# Patient Record
Sex: Male | Born: 1960
Health system: Southern US, Community
[De-identification: ages and names within clinical notes are randomized; demographics above are authoritative.]

## PROBLEM LIST (undated history)

## (undated) DIAGNOSIS — G4733 Obstructive sleep apnea (adult) (pediatric): Secondary | ICD-10-CM

## (undated) DIAGNOSIS — F32A Depression, unspecified: Secondary | ICD-10-CM

## (undated) DIAGNOSIS — K219 Gastro-esophageal reflux disease without esophagitis: Secondary | ICD-10-CM

## (undated) DIAGNOSIS — Z95810 Presence of automatic (implantable) cardiac defibrillator: Secondary | ICD-10-CM

## (undated) DIAGNOSIS — Z8719 Personal history of other diseases of the digestive system: Secondary | ICD-10-CM

## (undated) DIAGNOSIS — Z8639 Personal history of other endocrine, nutritional and metabolic disease: Secondary | ICD-10-CM

## (undated) DIAGNOSIS — Z87442 Personal history of urinary calculi: Secondary | ICD-10-CM

## (undated) DIAGNOSIS — J189 Pneumonia, unspecified organism: Secondary | ICD-10-CM

## (undated) DIAGNOSIS — I209 Angina pectoris, unspecified: Secondary | ICD-10-CM

## (undated) DIAGNOSIS — I4891 Unspecified atrial fibrillation: Secondary | ICD-10-CM

## (undated) DIAGNOSIS — E785 Hyperlipidemia, unspecified: Secondary | ICD-10-CM

## (undated) DIAGNOSIS — F329 Major depressive disorder, single episode, unspecified: Secondary | ICD-10-CM

## (undated) DIAGNOSIS — I10 Essential (primary) hypertension: Secondary | ICD-10-CM

## (undated) DIAGNOSIS — Z9989 Dependence on other enabling machines and devices: Secondary | ICD-10-CM

## (undated) DIAGNOSIS — I509 Heart failure, unspecified: Secondary | ICD-10-CM

## (undated) DIAGNOSIS — K649 Unspecified hemorrhoids: Secondary | ICD-10-CM

## (undated) DIAGNOSIS — E119 Type 2 diabetes mellitus without complications: Secondary | ICD-10-CM

## (undated) DIAGNOSIS — M549 Dorsalgia, unspecified: Secondary | ICD-10-CM

## (undated) DIAGNOSIS — F419 Anxiety disorder, unspecified: Secondary | ICD-10-CM

## (undated) DIAGNOSIS — I2510 Atherosclerotic heart disease of native coronary artery without angina pectoris: Secondary | ICD-10-CM

## (undated) DIAGNOSIS — I959 Hypotension, unspecified: Secondary | ICD-10-CM

## (undated) DIAGNOSIS — G8929 Other chronic pain: Secondary | ICD-10-CM

## (undated) HISTORY — DX: Atherosclerotic heart disease of native coronary artery without angina pectoris: I25.10

## (undated) HISTORY — PX: TONSILLECTOMY: SUR1361

## (undated) HISTORY — PX: CORONARY ANGIOPLASTY WITH STENT PLACEMENT: SHX49

## (undated) HISTORY — DX: Hyperlipidemia, unspecified: E78.5

## (undated) HISTORY — DX: Unspecified atrial fibrillation: I48.91

## (undated) HISTORY — DX: Personal history of other endocrine, nutritional and metabolic disease: Z86.39

## (undated) HISTORY — DX: Essential (primary) hypertension: I10

## (undated) HISTORY — DX: Type 2 diabetes mellitus without complications: E11.9

---

## 1989-03-03 HISTORY — PX: REFRACTIVE SURGERY: SHX103

## 2003-09-15 ENCOUNTER — Ambulatory Visit (HOSPITAL_COMMUNITY): Admission: RE | Admit: 2003-09-15 | Discharge: 2003-09-15 | Payer: Self-pay | Admitting: *Deleted

## 2003-10-01 ENCOUNTER — Ambulatory Visit (HOSPITAL_COMMUNITY): Admission: RE | Admit: 2003-10-01 | Discharge: 2003-10-02 | Payer: Self-pay | Admitting: Cardiology

## 2004-01-07 ENCOUNTER — Ambulatory Visit (HOSPITAL_BASED_OUTPATIENT_CLINIC_OR_DEPARTMENT_OTHER): Admission: RE | Admit: 2004-01-07 | Discharge: 2004-01-07 | Payer: Self-pay | Admitting: Cardiology

## 2004-08-15 ENCOUNTER — Ambulatory Visit: Payer: Self-pay | Admitting: Cardiology

## 2004-08-25 ENCOUNTER — Ambulatory Visit: Payer: Self-pay | Admitting: Pulmonary Disease

## 2004-08-25 ENCOUNTER — Ambulatory Visit: Payer: Self-pay

## 2004-08-25 ENCOUNTER — Ambulatory Visit: Payer: Self-pay | Admitting: Cardiology

## 2005-02-23 ENCOUNTER — Ambulatory Visit: Payer: Self-pay | Admitting: Cardiology

## 2005-03-09 ENCOUNTER — Ambulatory Visit: Payer: Self-pay

## 2005-03-09 ENCOUNTER — Ambulatory Visit: Payer: Self-pay | Admitting: Cardiology

## 2006-10-08 ENCOUNTER — Emergency Department (HOSPITAL_COMMUNITY): Admission: EM | Admit: 2006-10-08 | Discharge: 2006-10-08 | Payer: Self-pay | Admitting: Emergency Medicine

## 2007-02-26 ENCOUNTER — Ambulatory Visit: Admission: RE | Admit: 2007-02-26 | Discharge: 2007-02-26 | Payer: Self-pay | Admitting: Interventional Cardiology

## 2007-02-26 ENCOUNTER — Ambulatory Visit: Payer: Self-pay | Admitting: Surgery

## 2007-02-26 ENCOUNTER — Encounter: Payer: Self-pay | Admitting: Internal Medicine

## 2007-07-26 ENCOUNTER — Encounter: Payer: Self-pay | Admitting: Internal Medicine

## 2007-07-31 ENCOUNTER — Encounter: Payer: Self-pay | Admitting: Internal Medicine

## 2009-02-16 ENCOUNTER — Encounter: Admission: RE | Admit: 2009-02-16 | Discharge: 2009-02-16 | Payer: Self-pay | Admitting: Emergency Medicine

## 2009-04-15 ENCOUNTER — Encounter: Payer: Self-pay | Admitting: Internal Medicine

## 2009-04-19 ENCOUNTER — Encounter: Payer: Self-pay | Admitting: Internal Medicine

## 2009-09-28 ENCOUNTER — Encounter: Payer: Self-pay | Admitting: Internal Medicine

## 2010-02-14 ENCOUNTER — Encounter: Payer: Self-pay | Admitting: Internal Medicine

## 2010-02-16 ENCOUNTER — Encounter: Payer: Self-pay | Admitting: Internal Medicine

## 2010-02-18 ENCOUNTER — Telehealth (INDEPENDENT_AMBULATORY_CARE_PROVIDER_SITE_OTHER): Payer: Self-pay | Admitting: *Deleted

## 2010-03-03 ENCOUNTER — Ambulatory Visit: Payer: Self-pay | Admitting: Diagnostic Radiology

## 2010-03-03 ENCOUNTER — Emergency Department (HOSPITAL_BASED_OUTPATIENT_CLINIC_OR_DEPARTMENT_OTHER): Admission: EM | Admit: 2010-03-03 | Discharge: 2010-03-03 | Payer: Self-pay | Admitting: Emergency Medicine

## 2010-03-10 ENCOUNTER — Ambulatory Visit: Payer: Self-pay | Admitting: Family Medicine

## 2010-03-10 DIAGNOSIS — J45909 Unspecified asthma, uncomplicated: Secondary | ICD-10-CM | POA: Insufficient documentation

## 2010-03-10 DIAGNOSIS — R32 Unspecified urinary incontinence: Secondary | ICD-10-CM | POA: Insufficient documentation

## 2010-03-10 DIAGNOSIS — I10 Essential (primary) hypertension: Secondary | ICD-10-CM | POA: Insufficient documentation

## 2010-03-10 DIAGNOSIS — IMO0001 Reserved for inherently not codable concepts without codable children: Secondary | ICD-10-CM | POA: Insufficient documentation

## 2010-03-10 DIAGNOSIS — E1165 Type 2 diabetes mellitus with hyperglycemia: Secondary | ICD-10-CM

## 2010-03-10 DIAGNOSIS — E785 Hyperlipidemia, unspecified: Secondary | ICD-10-CM | POA: Insufficient documentation

## 2010-03-10 LAB — CONVERTED CEMR LAB: LDL Cholesterol: 122.3 mg/dL

## 2010-03-11 ENCOUNTER — Encounter: Payer: Self-pay | Admitting: Family Medicine

## 2010-03-14 LAB — CONVERTED CEMR LAB
ALT: 29 units/L (ref 0–53)
Alkaline Phosphatase: 82 units/L (ref 39–117)
BUN: 19 mg/dL (ref 6–23)
Basophils Relative: 0.7 % (ref 0.0–3.0)
Bilirubin, Direct: 0.1 mg/dL (ref 0.0–0.3)
Calcium: 10.1 mg/dL (ref 8.4–10.5)
Chloride: 103 meq/L (ref 96–112)
Cholesterol: 187 mg/dL (ref 0–200)
Creatinine, Ser: 1.3 mg/dL (ref 0.4–1.5)
Creatinine,U: 129.5 mg/dL
Eosinophils Relative: 4 % (ref 0.0–5.0)
GFR calc non Af Amer: 64.17 mL/min (ref >60)
HDL: 31.9 mg/dL — ABNORMAL LOW (ref >39.00)
Hgb A1c MFr Bld: 10.2 % — ABNORMAL HIGH (ref 4.6–6.5)
Lymphocytes Relative: 43.9 % (ref 12.0–46.0)
MCV: 100.9 fL — ABNORMAL HIGH (ref 78.0–100.0)
Microalb Creat Ratio: 3.9 mg/g (ref 0.0–30.0)
Monocytes Absolute: 0.9 10*3/uL (ref 0.1–1.0)
Neutrophils Relative %: 36.3 % — ABNORMAL LOW (ref 43.0–77.0)
PSA: 0.24 ng/mL (ref 0.10–4.00)
Platelets: 272 10*3/uL (ref 150.0–400.0)
RBC: 4.99 M/uL (ref 4.22–5.81)
Total Bilirubin: 0.5 mg/dL (ref 0.3–1.2)
Total CHOL/HDL Ratio: 6
Total CK: 161 units/L (ref 7–232)
Triglycerides: 330 mg/dL — ABNORMAL HIGH (ref 0.0–149.0)
VLDL: 66 mg/dL — ABNORMAL HIGH (ref 0.0–40.0)
WBC: 6.2 10*3/uL (ref 4.5–10.5)

## 2010-04-01 ENCOUNTER — Telehealth (INDEPENDENT_AMBULATORY_CARE_PROVIDER_SITE_OTHER): Payer: Self-pay | Admitting: *Deleted

## 2010-04-01 ENCOUNTER — Encounter: Payer: Self-pay | Admitting: Family Medicine

## 2010-04-07 ENCOUNTER — Encounter: Payer: Self-pay | Admitting: Internal Medicine

## 2010-04-27 ENCOUNTER — Telehealth: Payer: Self-pay | Admitting: Family Medicine

## 2010-05-09 ENCOUNTER — Ambulatory Visit: Payer: Self-pay | Admitting: Family Medicine

## 2010-06-08 ENCOUNTER — Ambulatory Visit: Payer: Self-pay | Admitting: Family Medicine

## 2010-06-09 ENCOUNTER — Ambulatory Visit: Payer: Self-pay | Admitting: Internal Medicine

## 2010-07-05 ENCOUNTER — Ambulatory Visit: Admit: 2010-07-05 | Payer: Self-pay | Admitting: Family Medicine

## 2010-07-24 ENCOUNTER — Encounter: Payer: Self-pay | Admitting: Interventional Cardiology

## 2010-08-02 NOTE — Letter (Signed)
Summary: [facility] Visit Summary [facility] Physicians Office Visit Summary View   Imported By: Sallee Provencal 05/19/2010 16:12:56  _____________________________________________________________________  External Attachment:    Type:   Image     Comment:   External Document

## 2010-08-02 NOTE — Letter (Signed)
Summary: [facility] Visit Note   [facility] Physicians Office Visit Note   Imported By: Sallee Provencal 05/19/2010 16:10:11  _____________________________________________________________________  External Attachment:    Type:   Image     Comment:   External Document

## 2010-08-02 NOTE — Progress Notes (Signed)
Summary: Meds refilled   Phone Note Refill Request   Refills Requested: Medication #1:  ATACAND 32 MG TABS 1 by mouth qd  Medication #2:  PLAVIX 75 MG TABS 1 by mouth daily [facility] [address] [phone].Marland KitchenMarland KitchenPT HAS NOT HAD THESE MEDS FILLED HERE, SEEN AS NEW EST CARE 03/10/10 AND LABS WERE ALSO DONE AT THAT TIME, PLEASE ADV IF IT IS OK TO FAX IN RX   Method Requested: Electronic Initial call taken by: Aron Baba CMA Deborra Medina),  April 27, 2010 2:52 PM  Follow-up for Phone Call        pt was supposed to be seeing Cardiology---was that appointment never made? records were requested to be sent there.  refill x1 but we need to know what is going on with cardio Follow-up by: Garnet Koyanagi DO,  April 27, 2010 3:24 PM  Additional Follow-up for Phone Call Additional follow up Details #1::        No Referrals completed or cancelled in [facility], but there is a Cardiology preload done on 10/18 with another PCP's name on it. Additional Follow-up by: Aron Baba CMA Deborra Medina),  April 27, 2010 4:40 PM    Additional Follow-up for Phone Call Additional follow up Details #2::    Dr Caryl Comes is a cardiologist with [facility]- not pcp they requested his records from [facility] so it looks like pt was going to see them  Prescriptions: PLAVIX 75 MG TABS (CLOPIDOGREL BISULFATE) 1 by mouth daily  #30 x 0   Entered by:   Aron Baba CMA ([place])   Authorized by:   Garnet Koyanagi DO   Signed by:   Aron Baba CMA ([place]) on 04/28/2010   Method used:   Faxed to ...       [facility] [id number]* (retail)       [place], [place]  [address]       Ph: [phone]       Fax: [phone]   RxID:   [id number] ATACAND 32 MG TABS (CANDESARTAN CILEXETIL) 1 by mouth qd  #30 x 0   Entered by:   Aron Baba CMA ([place])   Authorized by:   Garnet Koyanagi DO   Signed by:   Aron Baba CMA ([place]) on 04/28/2010   Method used:   Faxed to ...       [facility]  [id number] (retail)       [place], [place]  [address]       Ph: [phone]       Fax: [phone]   RxID:   [id number]

## 2010-08-02 NOTE — Letter (Signed)
Summary: Glucose Log from Patient  Glucose Log from Patient   Imported By: Edmonia James 04/08/2010 10:22:15  _____________________________________________________________________  External Attachment:    Type:   Image     Comment:   External Document

## 2010-08-02 NOTE — Assessment & Plan Note (Signed)
Summary: CPX/FASTING//KN   Vital Signs:  Patient profile:   50 year old male Height:      71 inches Weight:      227.0 pounds Temp:     99.2 degrees F oral Pulse rate:   84 / minute Pulse rhythm:   regular BP sitting:   120 / 84  (right arm) Cuff size:   regular  Vitals Entered By: Aron Baba CMA Deborra Medina) (May 09, 2010 1:11 PM) CC: CPX--stated that he had been having leg pain, thinks it could be from Lipitor-- stopped meds 3 days ago   History of Present Illness: Pt here for cpe --- labs done earlier.   Lipitor was causing leg pain--- he stopped Lipitor 3 days ago  and pain improved but is not gone.  Pt also needs victoza.     Type 1 diabetes mellitus follow-up      This is a 50 year old man who presents with Type 2 diabetes mellitus follow-up.  The patient denies polyuria, polydipsia, blurred vision, self managed hypoglycemia, hypoglycemia requiring help, weight loss, weight gain, and numbness of extremities.  The patient denies the following symptoms: neuropathic pain, chest pain, vomiting, orthostatic symptoms, poor wound healing, intermittent claudication, vision loss, and foot ulcer.  Since the last visit the patient reports good dietary compliance, compliance with medications, not exercising regularly, and not monitoring blood glucose.  Since the last visit, the patient reports having had no eye care and no foot care.    Preventive Screening-Counseling & Management  Alcohol-Tobacco     Alcohol drinks/day: <1     Alcohol type: all     Smoking Status: never  Caffeine-Diet-Exercise     Caffeine use/day: 0     Does Patient Exercise: no  Hep-HIV-STD-Contraception     Dental Visit-last 6 months no     Dental Care Counseling: to seek dental care; no dental care within six months      Sexual History:  single.        Drug Use:  never.    Current Medications (verified): 1)  Atacand 32 Mg Tabs (Candesartan Cilexetil) .Marland Kitchen.. 1 By Mouth Qd 2)  Plavix 75 Mg Tabs (Clopidogrel  Bisulfate) .Marland Kitchen.. 1 By Mouth Daily 3)  Potassium Cl 20 Meq Er Tablets .Marland Kitchen.. 1 By Mouth Once Daily 4)  Atenolol 25 Mg Tabs (Atenolol) .Marland Kitchen.. 1 By Mouth Once Daily 5)  Furosemide 40 Mg Tabs (Furosemide) .Marland Kitchen.. 1 By Mouth Qd 6)  Glimepiride 4 Mg Tabs (Glimepiride) .Marland Kitchen.. 1 By Mouth Once Daily 7)  Androgel 1% Pump 150gm (120d Oses) .... Take As Directed 8)  Metformin Hcl 1000 Mg Tabs (Metformin Hcl) .Marland Kitchen.. 1 By Mouth Once Daily 9)  One Touch 10)  Victoza 18 Mg/75m Soln (Liraglutide) .... 0.6 Mg Subcutaneously Daily For 1 Week Then 1/2 Mg Subcutaneously For 1 Week 11)  Trilipix 135 Mg Cpdr (Choline Fenofibrate) ..Marland Kitchen. 1 By Mouth Once Daily  Allergies (verified): No Known Drug Allergies  Past History:  Past Medical History: Last updated: 04/19/2010 Coronary artery disease, drug eluting stent RCA 2005-EF 30% at that time Cardiomyopathy-alcohol use related Asthma Diabetes mellitus, type II Hypertension Urinary incontinence Hyperlipidemia Hypogonadism  Past Surgical History: Last updated: 04/19/2010 Stent place 8 years ago PTCA/stent tonsillectomy  Family History: Last updated: 03/10/2010 both parents had diabetes and HTN Family History Diabetes 1st degree relative Family History Hypertension  Social History: Last updated: 03/10/2010 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 both parents had diabetes and HTN Family History Diabetes 1st degree relative Family History Hypertension  Social History: Reviewed history from 03/10/2010 and no changes required. Occupation: retired Sport and exercise psychologist Never Smoked Alcohol use-yes Drug use-no Regular exercise-no Dental Care w/in 6 mos.:  no Sexual  History:  single Drug Use:  never  Review of Systems      See HPI General:  Denies chills, fatigue, fever, loss of appetite, malaise, sleep disorder, sweats, weakness, and weight loss. Eyes:  Denies blurring, discharge, double vision, eye irritation, eye pain, halos, itching, light sensitivity, red eye, vision loss-1 eye, and vision loss-both eyes. ENT:  Denies decreased hearing, difficulty swallowing, ear discharge, earache, hoarseness, nasal congestion, nosebleeds, postnasal drainage, ringing in ears, sinus pressure, and sore throat. CV:  Denies bluish discoloration of lips or nails, chest pain or discomfort, difficulty breathing at night, difficulty breathing while lying down, fainting, fatigue, leg cramps with exertion, lightheadness, near fainting, palpitations, shortness of breath with exertion, swelling of feet, swelling of hands, and weight gain. Resp:  Denies chest discomfort, chest pain with inspiration, cough, coughing up blood, excessive snoring, hypersomnolence, morning headaches, pleuritic, shortness of breath, sputum productive, and wheezing. GI:  Denies abdominal pain, bloody stools, change in bowel habits, constipation, dark tarry stools, diarrhea, excessive appetite, gas, hemorrhoids, indigestion, loss of appetite, nausea, vomiting, vomiting blood, and yellowish skin color. GU:  Denies decreased libido, discharge, dysuria, erectile dysfunction, genital sores, hematuria, incontinence, nocturia, urinary frequency, and urinary hesitancy. MS:  Denies joint pain, joint redness, joint swelling, loss of strength, low back pain, mid back pain, muscle aches, muscle , cramps, muscle weakness, stiffness, and thoracic pain. Derm:  Denies changes in color of skin, changes in nail beds, dryness, excessive perspiration, flushing, hair loss, insect bite(s), itching, lesion(s), poor wound healing, and rash. Neuro:  Denies brief paralysis, difficulty with concentration, disturbances in coordination,  falling down, headaches, inability to speak, memory loss, numbness, poor balance, seizures, sensation of room spinning, tingling, tremors, visual disturbances, and weakness. Psych:  Denies alternate hallucination ( auditory/visual), anxiety, depression, easily angered, easily tearful, irritability, mental problems, panic attacks, sense of great danger, suicidal thoughts/plans, thoughts of violence, unusual visions or sounds, and thoughts /plans of harming others. Endo:  Denies cold intolerance, excessive hunger, excessive thirst, excessive urination, heat intolerance, polyuria, and weight change. Heme:  Denies abnormal bruising, bleeding, enlarge lymph nodes, fevers, pallor, and skin discoloration. Allergy:  Denies hives or rash, itching eyes, persistent infections, seasonal allergies, and sneezing.  Physical Exam  General:  Well-developed,well-nourished,in no acute distress; alert,appropriate and cooperative throughout examination Head:  Normocephalic and atraumatic without obvious abnormalities. No apparent alopecia or balding. Eyes:  pupils equal, pupils round, pupils reactive to light, and no injection.   Ears:  External ear exam shows no significant lesions or deformities.  Otoscopic examination reveals clear canals, tympanic membranes are intact bilaterally without bulging, retraction, inflammation or discharge. Hearing is grossly normal bilaterally. Nose:  External nasal examination shows no deformity or inflammation. Nasal mucosa are pink and moist without lesions or exudates. Mouth:  Oral mucosa and oropharynx without lesions or exudates.  Teeth in good repair. Neck:  No deformities, masses, or tenderness noted.no carotid bruits.   Chest Wall:  No deformities, masses, tenderness or gynecomastia noted. Lungs:  Normal respiratory effort, chest expands symmetrically. Lungs  are clear to auscultation, no crackles or wheezes. Heart:  normal rate and no murmur.   Abdomen:  Bowel sounds  positive,abdomen soft and non-tender without masses, organomegaly or hernias noted. Rectal:  No external abnormalities noted. Normal sphincter tone. No rectal masses or tenderness. Genitalia:  Testes bilaterally descended without nodularity, tenderness or masses. No scrotal masses or lesions. No penis lesions or urethral discharge. Prostate:  no nodules, no asymmetry, no induration, and 1+ enlarged.   Msk:  No deformity or scoliosis noted of thoracic or lumbar spine.   Pulses:  R posterior tibial normal, R dorsalis pedis normal, R carotid normal, L posterior tibial normal, L dorsalis pedis normal, and L carotid normal.   Extremities:  No clubbing, cyanosis, edema, or deformity noted with normal full range of motion of all joints.   Neurologic:  No cranial nerve deficits noted. Station and gait are normal. Plantar reflexes are down-going bilaterally. DTRs are symmetrical throughout. Sensory, motor and coordinative functions appear intact. Skin:  Intact without suspicious lesions or rashes Cervical Nodes:  No lymphadenopathy noted Axillary Nodes:  No palpable lymphadenopathy Psych:  Cognition and judgment appear intact. Alert and cooperative with normal attention span and concentration. No apparent delusions, illusions, hallucinations  Diabetes Management Exam:    Foot Exam (with socks and/or shoes not present):       Sensory-Pinprick/Light touch:          Left medial foot (L-4): normal          Left dorsal foot (L-5): normal          Left lateral foot (S-1): normal          Right medial foot (L-4): normal          Right dorsal foot (L-5): normal          Right lateral foot (S-1): normal       Sensory-Monofilament:          Left foot: normal          Right foot: normal       Inspection:          Left foot: normal          Right foot: normal       Nails:          Left foot: normal          Right foot: normal   Impression & Recommendations:  Problem # 1:  PREVENTIVE HEALTH CARE  (ICD-V70.0)  Problem # 2:  MYALGIA (ICD-729.1)  Orders: Venipuncture (83094)  Problem # 3:  HYPERLIPIDEMIA (ICD-272.4)  The following medications were removed from the medication list:    Lipitor 20 Mg Tabs (Atorvastatin calcium) .Marland Kitchen... 1 by mouth qd His updated medication list for this problem includes:    Trilipix 135 Mg Cpdr (Choline fenofibrate) .Marland Kitchen... 1 by mouth once daily  Labs Reviewed: SGOT: 21 (03/10/2010)   SGPT: 29 (03/10/2010)   HDL:31.90 (03/10/2010)  LDL:122.3 (03/10/2010)  Chol:187 (03/10/2010)  Trig:330.0 (03/10/2010)  Problem # 4:  HYPERTENSION (ICD-401.9)  His updated medication list for this problem includes:    Atacand 32 Mg Tabs (Candesartan cilexetil) .Marland Kitchen... 1 by mouth qd    Atenolol 25 Mg Tabs (Atenolol) .Marland Kitchen... 1 by mouth once daily    Furosemide 40 Mg Tabs (Furosemide) .Marland Kitchen... 1 by mouth qd  BP today: 120/84 Prior BP: 148/96 (03/10/2010)  Labs Reviewed: K+: 4.9 (03/10/2010) Creat: : 1.3 (03/10/2010)   Chol: 187 (03/10/2010)   HDL: 31.90 (03/10/2010)  LDL: 122.3 (03/10/2010)   TG: 330.0 (03/10/2010)  Problem # 5:  DIABETES MELLITUS, TYPE II (ICD-250.00) optho due His updated medication list for this problem includes:    Atacand 32 Mg Tabs (Candesartan cilexetil) .Marland Kitchen... 1 by mouth qd    Glimepiride 4 Mg Tabs (Glimepiride) .Marland Kitchen... 1 by mouth once daily    Metformin Hcl 1000 Mg Tabs (Metformin hcl) .Marland Kitchen... 1 by mouth once daily    Victoza 18 Mg/43m Soln (Liraglutide) ..Marland Kitchen.. 0.6 mg subcutaneously daily for 1 week then 1/2 mg subcutaneously for 1 week  Orders: Podiatry Referral (Podiatry)  Labs Reviewed: Creat: 1.3 (03/10/2010)    Reviewed HgBA1c results: 10.2 (03/10/2010)  Problem # 6:  ASTHMA (ICD-493.90)  Complete Medication List: 1)  Atacand 32 Mg Tabs (Candesartan cilexetil) ..Marland Kitchen. 1 by mouth qd 2)  Plavix 75 Mg Tabs (Clopidogrel bisulfate) ..Marland Kitchen. 1 by mouth daily 3)  Potassium Cl 20 Meq Er Tablets  ..Marland KitchenMarland Kitchen 1 by mouth once daily 4)  Atenolol 25 Mg Tabs  (Atenolol) ..Marland Kitchen. 1 by mouth once daily 5)  Furosemide 40 Mg Tabs (Furosemide) ..Marland Kitchen. 1 by mouth qd 6)  Glimepiride 4 Mg Tabs (Glimepiride) ..Marland Kitchen. 1 by mouth once daily 7)  Androgel 1% Pump 150gm (120d Oses)  .... Take as directed 8)  Metformin Hcl 1000 Mg Tabs (Metformin hcl) ..Marland Kitchen. 1 by mouth once daily 9)  One Touch  10)  Victoza 18 Mg/331mSoln (Liraglutide) .... 0.6 mg subcutaneously daily for 1 week then 1/2 mg subcutaneously for 1 week 11)  Trilipix 135 Mg Cpdr (Choline fenofibrate) ...Marland Kitchen 1 by mouth once daily  Other Orders: Admin 1st Vaccine ([id number] Vaccine 3y45yr (90(76811dap => 77yr777yr (907(57262min of Any Addtl Vaccine (904(03559atient Instructions: 1)  fasting labs Dec---272.4  250.00  [facility],  [facility]---ov 3 weeks after labs Flu Vaccine Consent Questions     Do you have a history of severe allergic reactions to this vaccine? no    Any prior history of allergic reactions to egg and/or gelatin? no    Do you have a sensitivity to the preservative Thimersol? no    Do you have a past history of Guillan-Barre Syndrome? no    Do you currently have an acute febrile illness? no    Have you ever had a severe reaction to latex? no    Vaccine information given and explained to patient? yes    Are you currently pregnant? no    Lot Number:[id number]   Exp Date:12/31/2010   Site Given  Right Deltoid IM  Orders Added: 1)  Admin 1st Vaccine [90471] 2)  Flu Vaccine 52yrs64yr9065[74163]Tdap => 786yrs 36yr90715[84536]dmin of Any Addtl Vaccine [90472] 5)  Venipuncture [36415[46803]odiatry Referral [Podiatry] 7)  Est. Patient 40-64 years [99396[21224]unizations Administered:  Tetanus Vaccine:    Vaccine Type: Tdap    Site: left deltoid    Mfr: Merck    Dose: 0.5 ml    Route: IM    Given by: KimberAron BabaAAMA) [place]. Date: 04/21/2012    Lot #: [id number] given: 05/20/08 version given May 09, 2010.   Immunizations Administered:  Tetanus Vaccine:     Vaccine Type: Tdap    Site: left deltoid    Mfr: Merck    Dose: 0.5 ml    Route: IM    Given by: KimberAron BabaAAMA)[place]  Exp. Date: 04/21/2012    Lot #: [id number]    VIS given: 05/20/08 version given May 09, 2010. Marland Kitchenlbflu  Flu Vaccine Result Date:  05/09/2010 Flu Vaccine Result:  given Flu Vaccine Next Due:  1 yr LDL Result Date:  03/10/2010 LDL Result:  122.3 LDL Next Due:  12 wk

## 2010-08-02 NOTE — Progress Notes (Signed)
   Recieved Records from [place] gave to [facility] for NP appt. Joelene Millin [place]  February 18, 2010 8:02 AM

## 2010-08-02 NOTE — Letter (Signed)
Summary: [facility] Cardiology Office Visit Note   [facility] Cardiology Office Visit Note   Imported By: Sallee Provencal 05/19/2010 16:13:44  _____________________________________________________________________  External Attachment:    Type:   Image     Comment:   External Document

## 2010-08-02 NOTE — Letter (Signed)
Summary: [facility]   [facility] Physicians Office Visit Note   Imported By: Sallee Provencal 05/19/2010 16:10:44  _____________________________________________________________________  External Attachment:    Type:   Image     Comment:   External Document

## 2010-08-02 NOTE — Progress Notes (Signed)
Summary: blood sugar reading 9/30  Phone Note Call from Patient   Caller: Patient Summary of Call: patient brought in readings for blood sugar - he alsot asked for another sample of victoza 18 mg - he said he gets paid next week - reading given to kim - gave patient sample of victoza (1 box) .Marland KitchenArbie [place]  April 01, 2010 2:04 PM   Follow-up for Phone Call        given to [facility] for review. Please advise Follow-up by: Aron Baba CMA Deborra Medina),  April 01, 2010 2:50 PM  Additional Follow-up for Phone Call Additional follow up Details #1::        increase victoza to 1.8  con't to check --- if any problems call otherwise  OV in [facility] Additional Follow-up by: Garnet Koyanagi DO,  April 01, 2010 3:14 PM    Additional Follow-up for Phone Call Additional follow up Details #2::    Left message to call back [place] Deborra Medina)  April 01, 2010 4:14 PM    Made pt aware and he voiced understanding. Call ended .......Marland Kitchen Aron Baba CMA Deborra Medina)  April 01, 2010 4:31 PM

## 2010-08-02 NOTE — Assessment & Plan Note (Signed)
Summary: NEW TO EST//PH/DIABETIC   Vital Signs:  Patient profile:   50 year old male Height:      71 inches Weight:      235.8 pounds BMI:     33.01 Pulse rate:   84 / minute Pulse rhythm:   regular BP sitting:   148 / 96  (left arm) Cuff size:   regular  Vitals Entered By: Aron Baba CMA Deborra Medina) (March 10, 2010 2:33 PM)  Nutrition Counseling: Patient's BMI is greater than 25 and therefore counseled on weight management options. CC: [facility], wants to discuss Diabetes   History of Present Illness:  Type 1 diabetes mellitus follow-up      This is a 50 year old man who presents with Type 2 diabetes mellitus follow-up.  The problem began >1 year ago.  Pt here to establish and discuss DM.  The patient complains of weight gain and numbness of extremities, but denies polyuria, polydipsia, blurred vision, self managed hypoglycemia, hypoglycemia requiring help, and weight loss.  The patient denies the following symptoms: neuropathic pain, chest pain, vomiting, orthostatic symptoms, poor wound healing, intermittent claudication, vision loss, and foot ulcer.  Since the last visit the patient reports poor dietary compliance, compliance with medications, and not exercising regularly.  Since the last visit, the patient reports having had eye care by an ophthalmologist.  Complications from diabetes include peripheral neuropathy.    Hypertension follow-up      The patient also presents for Hypertension follow-up.  The patient denies lightheadedness, urinary frequency, headaches, edema, impotence, rash, and fatigue.  The patient denies the following associated symptoms: chest pain, chest pressure, exercise intolerance, dyspnea, palpitations, syncope, leg edema, and pedal edema.  Compliance with medications (by patient report) has been near 100%.  The patient reports that dietary compliance has been good.  The patient reports no exercise.  Adjunctive measures currently used by the patient include  salt restriction.    Preventive Screening-Counseling & Management  Alcohol-Tobacco     Alcohol drinks/day: <1     Alcohol type: all     Smoking Status: never  Caffeine-Diet-Exercise     Caffeine use/day: 0     Does Patient Exercise: no      Drug Use:  no.    Current Medications (verified): 1)  Atacand 32 Mg Tabs (Candesartan Cilexetil) .Marland Kitchen.. 1 By Mouth Qd 2)  Plavix 75 Mg Tabs (Clopidogrel Bisulfate) .Marland Kitchen.. 1 By Mouth Daily 3)  Lipitor 20 Mg Tabs (Atorvastatin Calcium) .Marland Kitchen.. 1 By Mouth Qd 4)  Potassium Cl 20 Meq Er Tablets .Marland Kitchen.. 1 By Mouth Once Daily 5)  Atenolol 25 Mg Tabs (Atenolol) .Marland Kitchen.. 1 By Mouth Once Daily 6)  Furosemide 40 Mg Tabs (Furosemide) .Marland Kitchen.. 1 By Mouth Qd 7)  Glimepiride 4 Mg Tabs (Glimepiride) .Marland Kitchen.. 1 By Mouth Once Daily 8)  Androgel 1% Pump 150gm (120d Oses) .... Take As Directed 9)  Metformin Hcl 1000 Mg Tabs (Metformin Hcl) .Marland Kitchen.. 1 By Mouth Once Daily 10)  One Touch 11)  Victoza 18 Mg/73m Soln (Liraglutide) .... 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 both parents had diabetes and HTN Family History Diabetes 1st degree relative Family History Hypertension  Social History: Reviewed  history and no changes required. 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 Lungs:  Normal respiratory effort, chest expands symmetrically. Lungs are clear to auscultation, no crackles or wheezes. Heart:  normal rate and no murmur.   Extremities:  No clubbing, cyanosis, edema, or deformity noted with normal full range of motion of all joints.   Psych:  Cognition and judgment appear intact. Alert and cooperative with normal attention span and concentration. No apparent delusions, illusions, hallucinations   Impression & Recommendations:  Problem # 1:  HYPERLIPIDEMIA (ICD-272.4)  His updated medication list for this problem includes:    Lipitor 20 Mg Tabs (Atorvastatin calcium) .Marland Kitchen... 1 by mouth qd  Orders: Venipuncture (49179) TLB-Lipid Panel (80061-LIPID) TLB-BMP (Basic Metabolic Panel-BMET) (15056-PVXYIAX) TLB-CBC Platelet - w/Differential (85025-CBCD) TLB-Hepatic/Liver Function Pnl (80076-HEPATIC) TLB-TSH (Thyroid Stimulating Hormone) (84443-TSH) TLB-PSA (Prostate Specific Antigen) (84153-PSA) TLB-A1C / Hgb A1C (Glycohemoglobin) (83036-A1C) TLB-Microalbumin/Creat Ratio, Urine (82043-MALB) Specimen Handling (99000)  Problem # 2:  HYPERTENSION (ICD-401.9)  His updated medication list for this problem includes:    Atacand 32 Mg Tabs (Candesartan cilexetil) .Marland Kitchen... 1 by mouth qd    Atenolol 25 Mg Tabs (Atenolol) .Marland Kitchen... 1 by mouth once daily    Furosemide 40 Mg Tabs (Furosemide) .Marland Kitchen... 1 by mouth qd  Orders: Venipuncture (65537) TLB-Lipid Panel  (80061-LIPID) TLB-BMP (Basic Metabolic Panel-BMET) (48270-BEMLJQG) TLB-CBC Platelet - w/Differential (85025-CBCD) TLB-Hepatic/Liver Function Pnl (80076-HEPATIC) TLB-TSH (Thyroid Stimulating Hormone) (84443-TSH) TLB-PSA (Prostate Specific Antigen) (84153-PSA) TLB-A1C / Hgb A1C (Glycohemoglobin) (83036-A1C) TLB-Microalbumin/Creat Ratio, Urine (82043-MALB) Specimen Handling (99000)  Problem # 3:  DIABETES MELLITUS, TYPE II (ICD-250.00)  The following medications were removed from the medication list:    Lantus 100 Unit/ml Soln (Insulin glargine) .Marland Kitchen... Take daily as directed    Lantus Solostar 100 Unit/ml Soln (Insulin glargine) .Marland Kitchen... Take as directed once daily His updated medication list for this problem includes:    Atacand 32 Mg Tabs (Candesartan cilexetil) .Marland Kitchen... 1 by mouth qd    Glimepiride 4 Mg Tabs (Glimepiride) .Marland Kitchen... 1 by mouth once daily    Metformin Hcl 1000 Mg Tabs (Metformin hcl) .Marland Kitchen... 1 by mouth once daily    Victoza 18 Mg/72m Soln (Liraglutide) ..Marland Kitchen.. 0.6 mg subcutaneously daily for 1 week then 1/2 mg subcutaneously for 1 week  Orders: Venipuncture ((92010 TLB-Lipid Panel (80061-LIPID) TLB-BMP (Basic Metabolic Panel-BMET) (807121-FXJOITG TLB-CBC Platelet - w/Differential (85025-CBCD) TLB-Hepatic/Liver Function Pnl (80076-HEPATIC) TLB-TSH (Thyroid Stimulating Hormone) (84443-TSH) TLB-PSA (Prostate Specific Antigen) (84153-PSA) TLB-A1C / Hgb A1C (Glycohemoglobin) (83036-A1C) TLB-Microalbumin/Creat Ratio, Urine (82043-MALB) Specimen Handling (99000) EKG w/ Interpretation (93000)  Complete Medication List: 1)  Atacand 32 Mg Tabs (Candesartan cilexetil) ..Marland Kitchen. 1 by mouth qd 2)  Plavix 75 Mg Tabs (Clopidogrel bisulfate) ..Marland Kitchen. 1 by mouth daily 3)  Lipitor 20 Mg Tabs (Atorvastatin  calcium) .Marland Kitchen.. 1 by mouth qd 4)  Potassium Cl 20 Meq Er Tablets  .Marland KitchenMarland Kitchen. 1 by mouth once daily 5)  Atenolol 25 Mg Tabs (Atenolol) .Marland Kitchen.. 1 by mouth once daily 6)  Furosemide 40 Mg Tabs (Furosemide) .Marland Kitchen.. 1 by  mouth qd 7)  Glimepiride 4 Mg Tabs (Glimepiride) .Marland Kitchen.. 1 by mouth once daily 8)  Androgel 1% Pump 150gm (120d Oses)  .... Take as directed 9)  Metformin Hcl 1000 Mg Tabs (Metformin hcl) .Marland Kitchen.. 1 by mouth once daily 10)  One Touch  11)  Victoza 18 Mg/58m Soln (Liraglutide) .... 0.6 mg subcutaneously daily for 1 week then 1/2 mg subcutaneously for 1 week  Other Orders: TLB-CK Total Only(Creatine Kinase/CPK) (82550-CK)  Patient Instructions: 1)  check BS fasting and 2 hours after a meal 2)  drop them off or fax in 2 weeks 3)  schedule physical Prescriptions: VICTOZA 18 MG/3ML SOLN (LIRAGLUTIDE) 0.6 mg Subcutaneously daily for 1 week then 1/2 mg Subcutaneously for 1 week  #1 month x 2   Entered and Authorized by:   YGarnet KoyanagiDO   Signed by:   YGarnet KoyanagiDO on 03/10/2010   Method used:   Print then Give to Patient   RxID:   [id number]

## 2010-09-15 LAB — CBC
Hemoglobin: 16.5 g/dL (ref 13.0–17.0)
MCH: 34.2 pg — ABNORMAL HIGH (ref 26.0–34.0)
MCV: 98.6 fL (ref 78.0–100.0)
RBC: 4.81 MIL/uL (ref 4.22–5.81)

## 2010-09-15 LAB — DIFFERENTIAL
Eosinophils Absolute: 0.1 10*3/uL (ref 0.0–0.7)
Eosinophils Relative: 2 % (ref 0–5)
Lymphs Abs: 1.7 10*3/uL (ref 0.7–4.0)
Monocytes Absolute: 0.5 10*3/uL (ref 0.1–1.0)
Monocytes Relative: 8 % (ref 3–12)

## 2010-09-15 LAB — BASIC METABOLIC PANEL
CO2: 29 mEq/L (ref 19–32)
Chloride: 100 mEq/L (ref 96–112)
GFR calc Af Amer: >60 mL/min (ref >60)
Glucose, Bld: 266 mg/dL — ABNORMAL HIGH (ref 70–99)
Sodium: 141 mEq/L (ref 135–145)

## 2010-10-18 ENCOUNTER — Ambulatory Visit (INDEPENDENT_AMBULATORY_CARE_PROVIDER_SITE_OTHER): Payer: Self-pay | Admitting: Internal Medicine

## 2010-10-18 ENCOUNTER — Encounter: Payer: Self-pay | Admitting: Internal Medicine

## 2010-10-18 DIAGNOSIS — E785 Hyperlipidemia, unspecified: Secondary | ICD-10-CM

## 2010-10-18 DIAGNOSIS — I2589 Other forms of chronic ischemic heart disease: Secondary | ICD-10-CM

## 2010-10-18 DIAGNOSIS — I2510 Atherosclerotic heart disease of native coronary artery without angina pectoris: Secondary | ICD-10-CM

## 2010-10-18 DIAGNOSIS — R000 Tachycardia, unspecified: Secondary | ICD-10-CM

## 2010-10-18 DIAGNOSIS — I255 Ischemic cardiomyopathy: Secondary | ICD-10-CM

## 2010-10-18 MED ORDER — ASPIRIN EC 81 MG PO TBEC: 81.0000 mg | DELAYED_RELEASE_TABLET | Freq: Every day | ORAL | Status: AC

## 2010-10-18 MED ORDER — METOPROLOL SUCCINATE ER 25 MG PO TB24: 25.0000 mg | ORAL_TABLET | Freq: Every day | ORAL | Status: DC

## 2010-10-18 MED ORDER — PRAVASTATIN SODIUM 40 MG PO TABS: 40.0000 mg | ORAL_TABLET | Freq: Every evening | ORAL | Status: DC

## 2010-10-18 MED ORDER — LISINOPRIL 5 MG PO TABS: 5.0000 mg | ORAL_TABLET | Freq: Every day | ORAL | Status: DC

## 2010-10-18 NOTE — Assessment & Plan Note (Signed)
We will measure his hemoglobin A1c

## 2010-10-18 NOTE — Patient Instructions (Addendum)
Your physician recommends that you schedule a follow-up appointment in: PENDING TEST RESULTS Your physician has requested that you have a lexiscan myoview. For further information please visit [email or web address]. Please follow instruction sheet, as given. AT [place] Your physician has recommended you make the following change in your medication: START TOPROL  25 MG  EVERY DAY,LISINOPRIL 2.5 MG EVERY DAY, ASA 81 MG EVERY DAY, AND  PRAVACHOL 40 MG EVERY DAY  Your physician recommends that you return for lab work in: TODAY  HGB A1C BMET CBC TSH

## 2010-10-18 NOTE — Assessment & Plan Note (Signed)
As noted above

## 2010-10-18 NOTE — Progress Notes (Signed)
HPI: Vincent Chandler is a 50 y.o. male who saw Dr. Percival Spanish about 10 years ago in the setting of being identified with a cardiomyopathy and coronary disease for which he underwent RCA stenting. He lost all his health insurance and stopped taking his medications a year ago. He comes in today to reestablish cardiovascular care.  He knows that he has some positive exercise tolerance with a tingling discomfort that emerges in his chest associated with diaphoresis but no shortness of breath. It is relieved with rest.  He also was diagnosed with diabetes. He lost 25 pounds. He does not know whether he is hyperglycemic at this time.  He has had no palpitations or syncope.  He does not smoke. He has not been taking antilipid therapy.  He is undergoing brachial stress with his company going bankrupt and getting divorced.  No current outpatient prescriptions on file.    Allergies not on file  No past medical history on file.  No past surgical history on file.  No family history on file.  History   Social History  . Marital Status: Single    Spouse Name: N/A    Number of Children: N/A  . Years of Education: N/A   Occupational History  . Not on file.   Social History Main Topics  . Smoking status: Never Smoker   . Smokeless tobacco: Not on file  . Alcohol Use: Not on file  . Drug Use: Not on file  . Sexually Active: Not on file   Other Topics Concern  . Not on file   Social History Narrative  . No narrative on file    Fourteen point review of systems was negative except as noted in HPI and PMH depression and asthma as a child  PHYSICAL EXAMINATION  Blood pressure 120/78, height 5' 10" (1.778 m), weight 220 lb 12.8 oz (100.154 kg).   Well developed and nourished middle-aged Caucasian male in no acute distress HENT normal Neck supple with JVP-flat Carotids brisk and full without bruits Back without scoliosis or kyphosis Clear Regular rate and rhythm, no murmurs or  gallops Abd-soft with active BS without hepatomegaly or midline pulsation Femoral pulses 2+ distal pulses intact No Clubbing cyanosis edema Skin-warm and dry LN-neg submandibular and supraclavicular A & Oriented CN 3-12 normal  Grossly normal sensory and motor function Affect engaging .  ECG demonstrates sinus tachycardia 114 beats per minute. They're occasionally ventricular ectopic beats. There Q waves that are new compared to 2006 in the inferior leads. I should note the sinus tachycardia goes back to 2005.

## 2010-10-18 NOTE — Assessment & Plan Note (Signed)
Begin him onpravastatin. He will need reassessment of his hypertriglyceridemia

## 2010-10-18 NOTE — Assessment & Plan Note (Signed)
The patient is having recurrent chest pain concerning for angina. He has new Q waves on electrocardiogram consistent with occlusion of his RCA stent. We will undertake a stress Myoview scan. His heart rate is quite fast. He may need to be done with a Lexiscan.  We will resume empiric therapy with aspirin and beta blockers and antilipid therapy

## 2010-10-19 LAB — BASIC METABOLIC PANEL
CO2: 28 mEq/L (ref 19–32)
Calcium: 9 mg/dL (ref 8.4–10.5)
Creatinine, Ser: 1.2 mg/dL (ref 0.4–1.5)

## 2010-10-19 LAB — CBC WITH DIFFERENTIAL/PLATELET
Eosinophils Relative: 2.4 % (ref 0.0–5.0)
HCT: 45.1 % (ref 39.0–52.0)
Lymphs Abs: 2.2 10*3/uL (ref 0.7–4.0)
MCHC: 34.5 g/dL (ref 30.0–36.0)
MCV: 98.4 fl (ref 78.0–100.0)
Monocytes Absolute: 0.6 10*3/uL (ref 0.1–1.0)
Platelets: 282 10*3/uL (ref 150.0–400.0)
RDW: 13.7 % (ref 11.5–14.6)
WBC: 7 10*3/uL (ref 4.5–10.5)

## 2010-10-19 LAB — TSH: TSH: 0.85 u[IU]/mL (ref 0.35–5.50)

## 2010-10-20 NOTE — Progress Notes (Signed)
Addended by: Fredia Beets on: 10/20/2010 12:06 PM   Modules accepted: Orders

## 2010-10-26 ENCOUNTER — Ambulatory Visit (HOSPITAL_COMMUNITY): Payer: Self-pay | Attending: Internal Medicine | Admitting: Radiology

## 2010-10-26 VITALS — Ht 71.0 in | Wt 219.0 lb

## 2010-10-26 DIAGNOSIS — I4949 Other premature depolarization: Secondary | ICD-10-CM

## 2010-10-26 DIAGNOSIS — R0609 Other forms of dyspnea: Secondary | ICD-10-CM

## 2010-10-26 DIAGNOSIS — R079 Chest pain, unspecified: Secondary | ICD-10-CM

## 2010-10-26 DIAGNOSIS — I2510 Atherosclerotic heart disease of native coronary artery without angina pectoris: Secondary | ICD-10-CM | POA: Insufficient documentation

## 2010-10-26 DIAGNOSIS — E119 Type 2 diabetes mellitus without complications: Secondary | ICD-10-CM

## 2010-10-26 MED ORDER — REGADENOSON 0.4 MG/5ML IV SOLN
0.4000 mg | Freq: Once | INTRAVENOUS | Status: AC
  Administered 2010-10-26: 0.4 mg via INTRAVENOUS

## 2010-10-26 MED ORDER — TECHNETIUM TC 99M TETROFOSMIN IV KIT
33.0000 | PACK | Freq: Once | INTRAVENOUS | Status: AC | PRN
  Administered 2010-10-26: 33 via INTRAVENOUS

## 2010-10-26 MED ORDER — TECHNETIUM TC 99M TETROFOSMIN IV KIT
11.0000 | PACK | Freq: Once | INTRAVENOUS | Status: AC | PRN
  Administered 2010-10-26: 11 via INTRAVENOUS

## 2010-10-26 NOTE — Progress Notes (Addendum)
[place] [address] [phone]  Cardiology Nuclear Med Study  Vincent Chandler is a 50 y.o. male [id number] 30-Nov-1960   Nuclear Med Background Indication for Stress Test:  Evaluation for Ischemia, Stent Patency, PTCA Patency and Abnormal EKG History:  '05 Echo: EF=30%;09/23/03 MPS: Prior inferior scar with sig. residual ischemia, EF=32%>05 Cath>PTCA/ Stent RCA, EF=30%; Hx. Asthma, CHF, Pulmonary edema,ICM Cardiac Risk Factors: Family History - CAD, Hypertension, IDDM Type 2 and Lipids  Symptoms:  Chest Pain with Exertion (last date of chest discomfort 4 weeks ago), Diaphoresis, DOE, Fatigue with Exertion and SOB   Nuclear Pre-Procedure Caffeine/Decaff Intake:  None NPO After: 10:00pm   Lungs:  clear IV 0.9% NS with Angio Cath:  20g  IV Site: R Hand  IV Started by:  Eliezer Lofts, EMT-P  Chest Size (in):  48 Cup Size: n/a  Height: _0  (1.803 m)  Weight:  219 lb (99.338 kg)  BMI:  Body mass index is 30.54 kg/(m^2). Tech Comments:  Lisinopril,and metoprolol held 36 hours, per patient    Nuclear Med Study 1 or 2 day study: 1 day  Stress Test Type:  Lexiscan  Reading MD: Dola Argyle, MD  Order Authorizing Provider:  Virl Axe, MD  Resting Radionuclide: Technetium 26mTetrofosmin  Resting Radionuclide Dose: 11.0 mCi   Stress Radionuclide:  Technetium 931metrofosmin  Stress Radionuclide Dose: 33.0 mCi           Stress Protocol Rest HR: 94 Stress HR: 107  Rest BP: 154/105 Stress BP: 152/109  Exercise Time (min): n/a METS: n/a   Predicted Max HR: 171 bpm % Max HR: 62.57 bpm Rate Pressure Product: 16264   Dose of Adenosine (mg):  n/a Dose of Lexiscan: 0.4 mg  Dose of Atropine (mg): n/a Dose of Dobutamine: n/a mcg/kg/min (at max HR)  Stress Test Technologist: PaIrven BaltimoreRN  Nuclear Technologist:  StCharlton AmorCNMT     Rest Procedure:  Myocardial perfusion imaging was performed at rest 45  minutes following the intravenous administration of Technetium 9987mtrofosmin. Rest ECG: NSR with poor R wave progression, and T wave changes  Stress Procedure:  The patient received IV Lexiscan 0.4 mg over 15-seconds.  Technetium 63m34mrofosmin injected at 30-seconds.  There were no significant changes with Lexiscan. There were frequent PVC's/ Bigeminy. Quantitative spect images were obtained after a 45 minute delay. Stress ECG: No significant change from baseline ECG  QPS Raw Data Images:  Patient motion noted; appropriate software correction applied. Stress Images:  There is severe decrease in activity in a moderate area affecting the infero-lateral wall.   Rest Images:  Same as stress Subtraction (SDS):  No evidence of ischemia. Transient Ischemic Dilatation (Normal <1.22):  1.01 Lung/Heart Ratio (Normal <0.45):  0.33  Quantitative Gated Spect Images QGS EDV:  NA   QGS ESV:  NA  QGS cine images: NA  QGS EF: Study not gated  Impression Exercise Capacity:  Lexiscan with no exercise. BP Response:  Normal blood pressure response. Clinical Symptoms:  SOB ECG Impression:  No significant ST segment change suggestive of ischemia. Comparison with Prior Nuclear Study: The findings of the infero-lateral infarct are new since the study of 2005.  Overall Impression:  The is a large inferlateral scar with no ischemia.    JeffDola Argylelease Inform Patient taht study shows a new heart attack since 2005 probably in the region of where is stent is.  We  need to do an echo to assess lV function as nuc was not gated and depending on those results we may need to think in terms of catheterization esp if he continues to have chest pain  Thanks

## 2010-10-27 ENCOUNTER — Telehealth: Payer: Self-pay

## 2010-10-27 DIAGNOSIS — E1165 Type 2 diabetes mellitus with hyperglycemia: Secondary | ICD-10-CM

## 2010-10-27 NOTE — Telephone Encounter (Signed)
Message copied by Aron Baba on Thu Oct 27, 2010  1:45 PM ------      Message from: Garnet Koyanagi      Created: Thu Oct 27, 2010  1:19 PM       Pt needs referral to endo

## 2010-10-27 NOTE — Progress Notes (Signed)
ROUTED TO DR. KLEIN.[name]

## 2010-10-27 NOTE — Telephone Encounter (Signed)
Patient's A1c was 11.1 and Dr.Lowne wants him to be referred to Endo...Marland KitchenMarland KitchenReferral has been put in. Left message for patient to call back        KP

## 2010-10-28 ENCOUNTER — Encounter: Payer: Self-pay | Admitting: Family Medicine

## 2010-10-28 NOTE — Telephone Encounter (Signed)
Pt has an upcoming appt with Dr.Lowne on 11/03/10 to address labs        KP

## 2010-11-03 ENCOUNTER — Encounter: Payer: Self-pay | Admitting: Family Medicine

## 2010-11-03 ENCOUNTER — Ambulatory Visit (INDEPENDENT_AMBULATORY_CARE_PROVIDER_SITE_OTHER): Payer: Self-pay | Admitting: Family Medicine

## 2010-11-03 VITALS — BP 114/80 | HR 80 | Wt 220.8 lb

## 2010-11-03 DIAGNOSIS — E119 Type 2 diabetes mellitus without complications: Secondary | ICD-10-CM

## 2010-11-03 DIAGNOSIS — I255 Ischemic cardiomyopathy: Secondary | ICD-10-CM

## 2010-11-03 DIAGNOSIS — I2589 Other forms of chronic ischemic heart disease: Secondary | ICD-10-CM

## 2010-11-03 DIAGNOSIS — E785 Hyperlipidemia, unspecified: Secondary | ICD-10-CM

## 2010-11-03 LAB — POCT URINALYSIS DIPSTICK
Blood, UA: NEGATIVE
Ketones, UA: NEGATIVE
Spec Grav, UA: 1.015
Urobilinogen, UA: 0.2
pH, UA: 5

## 2010-11-03 LAB — HEPATIC FUNCTION PANEL
ALT: 24 U/L (ref 0–53)
AST: 17 U/L (ref 0–37)
Total Protein: 6.9 g/dL (ref 6.0–8.3)

## 2010-11-03 LAB — LIPID PANEL
Cholesterol: 210 mg/dL — ABNORMAL HIGH (ref 0–200)
Total CHOL/HDL Ratio: 6
Triglycerides: 151 mg/dL — ABNORMAL HIGH (ref 0.0–149.0)

## 2010-11-03 MED ORDER — LISINOPRIL 5 MG PO TABS: 5.0000 mg | ORAL_TABLET | Freq: Every day | ORAL | Status: DC

## 2010-11-03 MED ORDER — FREESTYLE LANCETS MISC: Status: AC

## 2010-11-03 MED ORDER — METFORMIN HCL ER 500 MG PO TB24: ORAL_TABLET | ORAL | Status: DC

## 2010-11-03 MED ORDER — LIRAGLUTIDE 18 MG/3ML ~~LOC~~ SOLN: SUBCUTANEOUS | Status: DC

## 2010-11-03 MED ORDER — PRAVASTATIN SODIUM 40 MG PO TABS: 40.0000 mg | ORAL_TABLET | Freq: Every evening | ORAL | Status: DC

## 2010-11-03 MED ORDER — METOPROLOL SUCCINATE ER 25 MG PO TB24: 25.0000 mg | ORAL_TABLET | Freq: Every day | ORAL | Status: DC

## 2010-11-03 MED ORDER — GLUCOSE BLOOD VI STRP: ORAL_STRIP | Status: AC

## 2010-11-03 NOTE — Progress Notes (Signed)
  Subjective:     Vincent Chandler is a 51 y.o. male who presents for follow up of diabetes.. Current symptoms include: none. Patient denies foot ulcerations, hyperglycemia, hypoglycemia , increased appetite, nausea, paresthesia of the feet, polydipsia, polyuria and visual disturbances. Evaluation to date has been: pt had hgba1c with Dr Mariane Duval stopped all meds about 1 year ago because he lost his insurance.  He is back today to get back on track.. Home sugars: patient does not check sugars. Current treatments: pt stopped all meds but was doing well with Victoza and glucophage.. Last dilated eye exam -over 1 year ago.  The following portions of the patient's history were reviewed and updated as appropriate: allergies, current medications, past family history, past medical history, past social history, past surgical history and problem list.  Review of Systems Pertinent items are noted in HPI.    Objective:    BP 114/80  Pulse 80  Wt 220 lb 12.8 oz (100.154 kg) General appearance: alert, cooperative, appears stated age and no distress Eyes: conjunctivae/corneas clear. PERRL, EOM's intact. Fundi benign. Lungs: clear to auscultation bilaterally Heart: regular rate and rhythm, S1, S2 normal, no murmur, click, rub or gallop Extremities: extremities normal, atraumatic, no cyanosis or edema   Sensory exam of the foot is normal, tested with the monofilament. Good pulses, no lesions or ulcers, good peripheral pulses.    Patient was not evaluated for proper footwear and sizing.  Laboratory: last a1c--11.1    Assessment:    Diabetes mellitus Type II, under poor control.    Plan:    Discussed general issues about diabetes pathophysiology and management. Counseling at today's visit: focused on the need for regular aerobic exercise, focused on the need to adhere to the prescribed ADA diet, discussed the advantages of a diet low in carbohydrates and reminded to check sugars regularly and to  bring readings in at the time of the next visit. Neurosurgeon distributed. Addressed ADA diet. Suggested low cholesterol diet. Encouraged aerobic exercise. Discussed foot care. Reminded to get yearly retinal exam. Restarted metformin; see  medication orders. Restarted statin drug see medication orders. Restarted ACE inhibitor; see medication orders. Labs: fasting blood sugar, fasting lipid panel, hemoglobin A1C and microalbuminuria. Reminded to bring in blood sugar diary at next visit. Follow up in 3 months or as needed.

## 2010-11-03 NOTE — Patient Instructions (Signed)
Diabetes Meal Planning Guide The diabetes meal planning guide is a tool to help you plan your meals and snacks. It is important for people with diabetes to manage their blood sugar levels. Choosing the right foods and the right amounts throughout your day will help control your blood sugar. Eating right can even help you improve your blood pressure and reach or maintain a healthy weight. CARBOHYDRATE COUNTING MADE EASY When you eat carbohydrates, they turn to sugar (glucose). This raises your blood sugar level. Counting carbohydrates can help you control this level so you feel better. When you plan your meals by counting carbohydrates, you can have more flexibility in what you eat and balance your medicine with your food intake. Carbohydrate counting simply means adding up the total amount of carbohydrate grams (g) in your meals or snacks. Try to eat about the same amount at each meal. Foods with carbohydrates are listed below. Each portion below is 1 carbohydrate serving or 15 grams of carbohydrates. Ask your dietician how many grams of carbohydrates you should eat at each meal or snack. Grains and Starches 1 slice bread 1/2 English muffin or hotdog/hamburger bun 3/4 cup cold cereal (unsweetened) 1/3 cup cooked pasta or rice 1/2 cup starchy vegetables (corn, potatoes, peas, beans, winter squash) 1 tortilla (6 inches) 1/4 bagel 1 waffle or pancake (size of a CD) 1/2 cup cooked cereal 4 to 6 small crackers *Whole grain is recommended Fruit 1 cup fresh unsweetened berries, melon, papaya, pineapple 1 small fresh fruit 1/2 banana or mango 1/2 cup fruit juice (4 ounces unsweetened) 1/2 cup canned fruit in natural juice or water 2 tablespoons dried fruit 12 to 15 grapes or cherries Milk and Yogurt 1 cup fat-free or 1% milk 1 cup soy milk 6 ounces light yogurt with sugar-free sweetener 6 ounces low-fat soy yogurt 6 ounces plain yogurt Vegetables 1 cup raw or 1/2 cup cooked is counted as 0  carbohydrates or a "free" food. If you eat 3 or more servings at one meal, count them as 1 carbohydrate serving. Other Carbohydrates 3/4 ounces chips or pretzels 1/2 cup ice cream or frozen yogurt 1/4 cup sherbet or sorbet 2 inch square cake, no frosting 1 tablespoon honey, sugar, jam, jelly, or syrup 2 small cookies 3 squares of graham crackers 3 cups popcorn 6 crackers 1 cup broth-based soup Count 1 cup casserole or other mixed foods as 2 carbohydrate servings. Foods with less than 20 calories in a serving may be counted as 0 carbohydrates or a "free" food. You may want to purchase a book or computer software that lists the carbohydrate gram counts of different foods. In addition, the nutrition facts panel on the labels of the foods you eat are a good source of this information. The label will tell you how big the serving size is and the total number of carbohydrate grams you will be eating per serving. Divide this number by 15 to obtain the number of carbohydrate servings in a portion. Remember: 1 carbohydrate serving equals 15 grams of carbohydrate. SERVING SIZES Measuring foods and serving sizes helps you make sure you are getting the right amount of food. The list below tells how big or small some common serving sizes are.  1 ounce (oz) of cheese.................................4 stacked dice.   2 to 3 oz cooked meat.................................Marland KitchenDeck of cards.   1 teaspoon (tsp)...........................................Marland KitchenTip of little finger.   1 tablespoon (tbs).......................................Marland KitchenMarland KitchenThumb.   2 tbs............................................................Marland KitchenGolf ball.    cup..........................................................Marland KitchenHalf of a fist.   1 cup...........................................................Marland KitchenA fist.  SAMPLE DIABETES MEAL PLAN Below is a sample meal plan that includes foods from the grain and starches, dairy, vegetable, fruit, and  meat groups. A dietician can individualize a meal plan to fit your calorie needs and tell you the number of servings needed from each food group. However, controlling the total amount of carbohydrates in your meal or snack is more important than making sure you include all of the food groups at every meal. You may interchange carbohydrate containing foods (dairy, starches, and fruits). The meal plan below is an example of a 2000 calorie diet using carbohydrate counting. This meal plan has 17 carbohydrate servings (carb choices). Breakfast 1 cup oatmeal (2 carb choices) 3/4 cup light yogurt (1 carb choice) 1 cup blueberries (1 carb choice) 1/4 cup almonds  Snack 1 large apple (2 carb choices) 1 low-fat string cheese stick  Lunch Chicken breast salad:  1 cup spinach   1/4 cup chopped tomatoes   2 oz chicken breast, sliced   2 tbs low-fat [place] dressing  12 whole-wheat crackers (2 carb choices) 12 to 15 grapes (1 carb choice) 1 cup low-fat milk (1 carb choice)  Snack 1 cup carrots 1/2 cup hummus (1 carb choice)  Dinner 3 oz broiled salmon 1 cup brown rice (3 carb choices)  Snack 1 1/2 cups steamed broccoli (1 carb choice) drizzled with 1 tsp olive oil and lemon juice 1 cup light pudding (2 carb choices)  DIABETES MEAL PLANNING WORKSHEET Your dietician can use this worksheet to help you decide how many servings of foods and what types of foods are right for you.  Breakfast Food Group and Servings Carb Choices Grain/Starches _______________________________________ Dairy ______________________________________________ Vegetable _______________________________________ Fruit _______________________________________________ Meat _______________________________________________ Fat _____________________________________________ Lunch Food Group and Servings Carb Choices Grain/Starches ________________________________________ Dairy _______________________________________________ Fruit  ________________________________________________ Meat ________________________________________________ Fat _____________________________________________ Dinner Food Group and Servings Carb Choices Grain/Starches ________________________________________ Dairy _______________________________________________ Fruit ________________________________________________ Meat ________________________________________________ Fat _____________________________________________ Snacks Food Group and Servings Carb Choices Grain/Starches ________________________________________ Dairy _______________________________________________ Vegetable ________________________________________ Fruit ________________________________________________ Meat ________________________________________________ Fat _____________________________________________ Daily Totals Starches _________________________ Vegetable __________________________ Fruit ______________________________ Dairy ______________________________ Meat ______________________________ Fat ________________________________  Document Released: 03/16/2005 Document Re-Released: 12/07/2009 Exit[employer] Patient Information 2011 [place].

## 2010-11-09 NOTE — Progress Notes (Signed)
The pt is aware of his results. Echo scheduled for 11/17/10.

## 2010-11-11 ENCOUNTER — Other Ambulatory Visit: Payer: Self-pay | Admitting: Family Medicine

## 2010-11-11 DIAGNOSIS — E119 Type 2 diabetes mellitus without complications: Secondary | ICD-10-CM

## 2010-11-11 MED ORDER — INSULIN PEN NEEDLE 32G X 5 MM MISC: Status: DC

## 2010-11-11 MED ORDER — LIRAGLUTIDE 18 MG/3ML ~~LOC~~ SOLN: SUBCUTANEOUS | Status: DC

## 2010-11-16 ENCOUNTER — Other Ambulatory Visit (HOSPITAL_COMMUNITY): Payer: Self-pay | Admitting: Internal Medicine

## 2010-11-16 DIAGNOSIS — R9439 Abnormal result of other cardiovascular function study: Secondary | ICD-10-CM

## 2010-11-17 ENCOUNTER — Ambulatory Visit (HOSPITAL_COMMUNITY): Payer: Self-pay | Attending: Internal Medicine | Admitting: Radiology

## 2010-11-17 DIAGNOSIS — I2510 Atherosclerotic heart disease of native coronary artery without angina pectoris: Secondary | ICD-10-CM | POA: Insufficient documentation

## 2010-11-17 DIAGNOSIS — E785 Hyperlipidemia, unspecified: Secondary | ICD-10-CM | POA: Insufficient documentation

## 2010-11-17 DIAGNOSIS — R9439 Abnormal result of other cardiovascular function study: Secondary | ICD-10-CM

## 2010-11-17 DIAGNOSIS — E119 Type 2 diabetes mellitus without complications: Secondary | ICD-10-CM | POA: Insufficient documentation

## 2010-11-17 DIAGNOSIS — I379 Nonrheumatic pulmonary valve disorder, unspecified: Secondary | ICD-10-CM | POA: Insufficient documentation

## 2010-11-17 DIAGNOSIS — I059 Rheumatic mitral valve disease, unspecified: Secondary | ICD-10-CM | POA: Insufficient documentation

## 2010-11-17 DIAGNOSIS — R000 Tachycardia, unspecified: Secondary | ICD-10-CM | POA: Insufficient documentation

## 2010-11-18 NOTE — Cardiovascular Report (Signed)
NAME:  Vincent Chandler, Vincent Chandler NO.:  [id number]   MEDICAL RECORD NO.:  [id number]                   PATIENT TYPE:  OIB   LOCATION:  6533                                 FACILITY:  [place]   PHYSICIAN:  Ethelle Lyon, M.D. [facility]         DATE OF BIRTH:  24-Feb-1961   DATE OF PROCEDURE:  10/01/2003  DATE OF DISCHARGE:                              CARDIAC CATHETERIZATION   PROCEDURE:  Drug-eluting stent placement in right coronary artery.   INDICATIONS:  [name] is a 50 year old gentleman with nonischemic  cardiomyopathy with ejection fraction of approximately 30%.  He was noted to  have 75% RCA stenosis.  Subsequent stress perfusion study demonstrated a  large area of inferior ischemia.  He was therefore referred for  revascularization.   PROCEDURAL TECHNIQUE:  Informed consent was obtained.  Under 1% lidocaine  local anesthesia, a 6 French sheath was placed in the right femoral artery  using the modified Seldinger technique.  Anticoagulation was then initiated  with heparin and double-bolus eptifibatide.  The patient had been pre-loaded  on Plavix.   A 6 [place] guide was advanced over a wire and engaged in the ostium of  the right coronary artery.  After being unable to manipulate a Luge wire  into the true RCA distal to the lesion, a Whisper wire was positioned in the  distal RCA.  The lesion was directly stented using a 2.5 x 12 mm Taxus  stent.  Intracoronary nitroglycerin was administered, demonstrating the  vessel to be approximately 3 mm.  It was therefore post-dilated using a 3.0  x 12 mm Quantum.  After this post-dilation, further angiography demonstrated  the vessel to be substantially larger.  The stent was then further post-  dilated using a 3.5 x 12 mm Quantum at 16 atmospheres.  Final angiograms  demonstrated no residual stenosis and TIMI-3 flow to the distal vasculature.   IMPRESSION/RECOMMENDATION:  Successful drug-eluting stent placement  in the  mid-right coronary artery.  The patient will be continued on Plavix for one  year.  Eptifibatide will be administered for 18 hours.  Aspirin will be  continued indefinitely.                                               Ethelle Lyon, M.D. [facility]    WED/MEDQ  D:  10/01/2003  T:  10/02/2003  Job:  [id number]   cc:   Fay Records, M.D.   Minus Breeding, M.D.   Michael P. [name], M.D.  [facility]  [address]  [place]  [place] [address]  Fax: [phone]

## 2010-11-18 NOTE — Discharge Summary (Signed)
NAME:  Vincent Chandler, Vincent Chandler NO.:  [id number]   MEDICAL RECORD NO.:  [id number]                   PATIENT TYPE:  OIB   LOCATION:  6533                                 FACILITY:  [place]   PHYSICIAN:  Minus Breeding, M.D.                DATE OF BIRTH:  [birth year]   DATE OF ADMISSION:  10/01/2003  DATE OF DISCHARGE:  10/02/2003                           DISCHARGE SUMMARY - REFERRING   REASON FOR ADMISSION:  Mr. Follette is a 50 year old male, with recent  coronary angiography by Dr. Percival Spanish, revealing cardiomyopathy with an  ejection fraction of 35% with global hypokinesis and single vessel coronary  artery disease with a 70% mid-RCA lesion.  He presented for elective  percutaneous intervention.   LABORATORY DATA:  Electrolytes and renal function are normal at discharge.  Normal CBC at discharge.  Lipid profile:  Total cholesterol 193,  triglycerides 276, HDL 47, LDL 91.  Post intervention CPK 131/6.5.   ELECTROCARDIOGRAM:  Since tachycardia at 111 bpm with nonspecific  abnormalities at discharge.   HOSPITAL COURSE:  The patient underwent elective percutaneous intervention,  performed by Dr. Sabino Snipes (see report for full details), with  successful placement of a TAXUS stent and treatment of the RCA lesion to 0%  residual stenosis.  There were no noted complications.   The patient was kept for overnight observation, and cleared for discharge  the following morning in hemodynamically stable condition.   On examination, there was mild ecchymosis of the right groin but no hematoma  or bruit.   Regarding medications, the patient had recent substitution of lisinopril  with low-dose Atacand, given his intolerance to the lisinopril.  Recommendation was to therefore continue Atacand at its current dose.  We  did, however, start Lipitor 20 q.d. and provide a prescription for  nitroglycerin.  Beta blockers were deferred given patient's history of  asthma.   Of note, the patient will also be scheduled for a sleep study for evaluation  of sleep apnea, as previously recommended by Dr. Percival Spanish.   MEDICATIONS AT DISCHARGE:  1. Plavix 75 mg q.d. (x6 months).  2. Coated aspirin 325 mg q.d.  3. Lasix 20 mg q.d.  4. Atacand 16 mg q.d.  5. Lipitor 20 mg q.d.  6. Albuterol MDI p.r.n.  7. Nitrostat 0.4 mg p.r.n.   INSTRUCTIONS:  No heavy lifting, __________ x2; low-fat, low-cholesterol  diet; call the office is there is any swelling, bleeding in the groin.   The patient is instructed to keep previously scheduled follow-up appointment  with Dr. Percival Spanish on April 11.   DISCHARGE DIAGNOSES:  1. Cardiomyopathy/single vessel coronary artery disease.     A. Status post stent (TAXUS) right coronary artery, March 31.     B. Ejection fraction 35% by recent cardiac catheterization.  2. Hypertension.  3. Sinus tachycardia.  4. Dyslipidemia.  5. Asthma.  6. (?) Sleep apnea.  7. Recent pneumonia.  Gene Serpe, P.A. [facility]                      Minus Breeding, M.D.    GS/MEDQ  D:  10/02/2003  T:  10/03/2003  Job:  [id number]

## 2010-11-18 NOTE — Cardiovascular Report (Signed)
NAME:  Vincent Chandler, Vincent Chandler NO.:  [id number]   MEDICAL RECORD NO.:  [id number]                   PATIENT TYPE:  OIB   LOCATION:  2856                                 FACILITY:  [place]   PHYSICIAN:  Minus Breeding, M.D.                DATE OF BIRTH:  September 17, 1960   DATE OF PROCEDURE:  09/15/2003  DATE OF DISCHARGE:                              CARDIAC CATHETERIZATION   PROCEDURES PERFORMED:  1. Left and right heart catheterization.  2. Coronary arteriography.   CARDIOLOGIST:  Minus Breeding, M.D.   INDICATIONS:  Cardiomyopathy and congestive heart failure.   PROCEDURAL NOTE:  Left and right heart catheterization performed via the  right femoral artery.  The artery was cannulated using anterior wall  puncture.  A #6 French arterial sheath was inserted via the modified  Seldinger technique.  The vein was cannulated using anterior wall puncture.  A #8 French venous sheath was inserted via the modified Seldinger technique.  Preformed Judkins and a pigtail catheter were utilized. Swan-Ganz catheter  was utilized.   The patient tolerated the procedure well and left the lab in stable  condition.   RESULTS:   HEMODYNAMIC DATA:  LV 135/21.  AO 136/93.  RA mean 13.  RV 48/11.  PA 40/20.  Pulmonary capillary wedge pressure mean 19.  Cardiac output/cardiac index  (Fick) 4.8/2.2.   ANGIOGRAPHIC DATA:  Coronaries  Left Main:  The left main was normal.   LAD:  The LAD had 25% stenosis in the mid segment and a first diagonal.  The  first diagonal was small with an ostial 30% stenosis.  The second diagonal  and third diagonal were small.   Circumflex:  The circumflex in the AV groove had a distal long 30% stenosis  before the posterolateral.  He posterolateral-1 was an ostial 40% stenosis.  The posterolateral-2 was normal.   Right Coronary Artery:  The right coronary artery was dominant.  There was  75% mid stenosis.  There was 25% ostial stenosis with ostial  coronary spasm.   VENTRICULOGRAPHIC DATA:  Left Ventriculogram:  The left ventriculogram was  obtained in the RAO projection.  The EF was 35% with global hypokinesis.  There was inferobasal akinesis.   CONCLUSION:  Right coronary artery stenosis with a moderately reduced  ejection fraction.   PLAN:  The patient will have an outpatient Cardiolite to evaluate his right  coronary artery.  He has been asked to stop drinking, which may be  contributing to is cardiomyopathy.  We will go up on his beta blocker.  I  will add a statin for risk reduction.  I will also schedule a sleep study to  rule out orthopnea.   I have discussed this case with his primary care Morris Markham, Dr. [name].  I have  discussed this case with Dr. Dorris Carnes.  Minus Breeding, M.D.    JH/MEDQ  D:  09/15/2003  T:  09/16/2003  Job:  [id number]

## 2010-11-18 NOTE — Procedures (Signed)
NAME:  Vincent Chandler, Vincent Chandler NO.:  [id number]   MEDICAL RECORD NO.:  [id number]          PATIENT TYPE:  OUT   LOCATION:  SLEEP CENTER                 FACILITY:  [facility]   PHYSICIAN:  Danton Sewer, M.D. [facility] DATE OF BIRTH:  08-01-1960   DATE OF ADMISSION:  01/07/2004  DATE OF DISCHARGE:  01/07/2004                              NOCTURNAL POLYSOMNOGRAM   REFERRING PHYSICIAN:  Dr. Minus Breeding   INDICATION FOR STUDY:  Hypersomnia with sleep apnea.   SLEEP ARCHITECTURE:  The patient had a total sleep time of 363 minutes with  decreased slow-wave sleep.  Significant REM rebound was noted with CPAP  initiation.  Sleep onset latency was extremely short at 2.5 minutes and REM  latency was prolonged at 126 minutes.   IMPRESSION/RECOMMENDATIONS:  1. Very severe obstructive sleep apnea hypopnea syndrome with 263     obstructive events noted in the first 121 minutes of sleep.  This gave     the patient a respiratory disturbance index of 132 events per hour with     desaturation as low at 70%.  These events were not positional.  However,     they were associated with loud snoring.  As per split night protocol,     CPAP was initiated with a small comfort gel nasal mask and titrated to a     final pressure of 12 cm with mild breakthrough events.  Would therefore     treat the patient with 13-14 cm as an outpatient.  2. No clinically significant cardiac arrhythmias.                                   ______________________________                                Danton Sewer, M.D. [facility]     KC/MEDQ  D:  01/18/2004 15:52:30  T:  01/18/2004 23:44:03  Job:  [id number]

## 2010-11-22 ENCOUNTER — Telehealth: Payer: Self-pay | Admitting: Internal Medicine

## 2010-11-22 DIAGNOSIS — I2510 Atherosclerotic heart disease of native coronary artery without angina pectoris: Secondary | ICD-10-CM

## 2010-11-22 NOTE — Telephone Encounter (Signed)
Patient states Toprol  XL medication is too expensive. Can Dr. Caryl Comes prescribed a different medication less costly that is as  Effective. Patient states he still has a few pills left. He would like to know soon.

## 2010-11-22 NOTE — Telephone Encounter (Signed)
Went to get prescription for Toprol xl and the cost was 100.00 at [facility].  Can he use something else that would be as effective and cheaper?  Please call patient and let him know.

## 2010-11-24 MED ORDER — METOPROLOL TARTRATE 25 MG PO TABS: 25.0000 mg | ORAL_TABLET | Freq: Two times a day (BID) | ORAL | Status: DC

## 2010-11-24 NOTE — Telephone Encounter (Signed)
Can replacewith metoprolol tartrate 25 bid thnaks

## 2010-11-24 NOTE — Telephone Encounter (Signed)
I spoke with the pt. He is agreeable with trying metoprolol tartrate 36m bid. [facility]

## 2010-12-16 ENCOUNTER — Telehealth: Payer: Self-pay | Admitting: Family Medicine

## 2010-12-16 NOTE — Telephone Encounter (Signed)
Victoza sample given     KP

## 2010-12-22 ENCOUNTER — Telehealth: Payer: Self-pay | Admitting: Internal Medicine

## 2010-12-22 NOTE — Telephone Encounter (Signed)
Pt calling for results of echo-rtn call to heather m

## 2010-12-22 NOTE — Telephone Encounter (Signed)
Pt given Dr. Olin Pia remarks regarding echo. He will come in for office visit on December 29, 2010. 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 Pt states Dr.Klein is aware of work he does.  I told pt to rest frequently if possible and stay hydrated. I told pt I would forward to Dr. Caryl Comes and we would call him back if Dr. Caryl Comes felt activities should be limited.

## 2010-12-23 NOTE — Telephone Encounter (Signed)
I left a message on the patient's voice mail and advised against heavy lifting and to stay out of the heat.

## 2010-12-29 ENCOUNTER — Ambulatory Visit (INDEPENDENT_AMBULATORY_CARE_PROVIDER_SITE_OTHER): Payer: Self-pay | Admitting: Internal Medicine

## 2010-12-29 DIAGNOSIS — R000 Tachycardia, unspecified: Secondary | ICD-10-CM

## 2010-12-29 DIAGNOSIS — I5022 Chronic systolic (congestive) heart failure: Secondary | ICD-10-CM

## 2010-12-29 DIAGNOSIS — I10 Essential (primary) hypertension: Secondary | ICD-10-CM

## 2010-12-29 DIAGNOSIS — I2589 Other forms of chronic ischemic heart disease: Secondary | ICD-10-CM

## 2010-12-29 DIAGNOSIS — I471 Supraventricular tachycardia: Secondary | ICD-10-CM

## 2010-12-29 DIAGNOSIS — I498 Other specified cardiac arrhythmias: Secondary | ICD-10-CM

## 2010-12-29 DIAGNOSIS — I255 Ischemic cardiomyopathy: Secondary | ICD-10-CM

## 2010-12-29 DIAGNOSIS — I2510 Atherosclerotic heart disease of native coronary artery without angina pectoris: Secondary | ICD-10-CM

## 2010-12-29 MED ORDER — METOPROLOL TARTRATE 100 MG PO TABS: 100.0000 mg | ORAL_TABLET | Freq: Two times a day (BID) | ORAL | Status: DC

## 2010-12-29 NOTE — Assessment & Plan Note (Signed)
Augmentin beta blockers will be used for this as well as a sinus tachycardia.

## 2010-12-29 NOTE — Assessment & Plan Note (Signed)
He has worsening of left ventricular function. There has been intercurrent MI without ischemia. He has a persistent sinus tachycardia. We will need to work on controlling the heart rate as it may be contributing to his myopathy. I do not have a good Secondary explanation for the tachycardia.  His hemoglobin was normal. His TSH is borderline low. We will need to consider obtaining a free T3/T4 to exclude hyperthyroidism

## 2010-12-29 NOTE — Patient Instructions (Signed)
Your physician has recommended you make the following change in your medication:  1) Increase metoprolol tartrate to 63m two tablets twice daily until your current prescription is gone, 2) then increase metoprolol tartrate to 1069mone tablet by mouth twice daily.  Your physician recommends that you schedule a follow-up appointment in: 6 weeks.

## 2010-12-29 NOTE — Progress Notes (Signed)
  HPI  Vincent Chandler is a 50 y.o. male  Seen in followup for ischemic heart disease and sinus tachycardia. When he was seen last new Q waves were identified. There is a concern about an intercurrent myocardial infarction  He was admitted for Myoview scanning which demonstrated a new inferolateral infarct likely associated with occlusion of his RCA stent. There was no ischemia Echo The estimated ejection fraction was in the range of 25% to 30%. Diffuse hypokinesis- Left atrium: The atrium was mildly dilated.  He has sleep apnea and is using his mask. He denies significant problems at this point with chest pain or peripheral edema ; he does have modest exercise intolerance      Past Medical History  Diagnosis Date  . Coronary artery disease     drug eluting sten RCA 2005-EF 30% at that time  . Cardiomyopathy     alcohol use related  . Asthma   . Diabetes mellitus type II   . Hypertension   . Urinary incontinence   . Hyperlipidemia   . History of hypogonadism     Past Surgical History  Procedure Date  . Carotid stent     place 8 years ago  . Coronary angioplasty with stent placement   . Tonsillectomy     Current Outpatient Prescriptions  Medication Sig Dispense Refill  . aspirin EC 81 MG EC tablet Take 1 tablet (81 mg total) by mouth daily.  150 tablet  2  . glucose blood (FREESTYLE LITE) test strip accu check twice a day  100 each  1  . glucose blood (ONE TOUCH TEST STRIPS) test strip by Other route. Use as instructed       . Insulin Pen Needle 32G X 5 MM MISC Use as directed daily  100 each  3  . Lancets (FREESTYLE) lancets Accu check twice a day  100 each  1  . Liraglutide 18 MG/3ML SOLN 1.2 mg SQ qd  18 mL  3  . lisinopril (PRINIVIL,ZESTRIL) 5 MG tablet Take 1 tablet (5 mg total) by mouth daily.  90 tablet  3  . metoprolol succinate (TOPROL-XL) 25 MG 24 hr tablet Take 25 mg by mouth daily.        . pravastatin (PRAVACHOL) 40 MG tablet Take 1 tablet (40 mg total)  by mouth every evening.  90 tablet  3  . metoprolol tartrate (LOPRESSOR) 25 MG tablet Take 25 mg by mouth daily.        Marland Kitchen DISCONTD: metFORMIN (GLUCOPHAGE XR) 500 MG 24 hr tablet 2 po qd with evening meal  60 tablet  11  . DISCONTD: metoprolol tartrate (LOPRESSOR) 25 MG tablet Take 1 tablet (25 mg total) by mouth 2 (two) times daily.  60 tablet  11  . DISCONTD: Testosterone (ANDROGEL PUMP TD) Place onto the skin as directed.          No Known Allergies  Review of Systems negative except from HPI and PMH  Physical Exam Well developed and obese in no acute distress HENT normal E scleral and icterus clear Neck Supple; carotids brisk and full Clear to ausculation Regular rate and rhythm, no murmurs gallops or rub Soft with active bowel sounds No clubbing cyanosis and edema Alert and oriented, grossly normal motor and sensory function Skin Warm and Dry  ECG Sinus tachycardia at 1:15 Intervals 0.15/0.09/23 2 excellent axis is -80 Previous inferior wall MI. Is also a Q-wave in lead V6 Assessment and  Plan

## 2010-12-29 NOTE — Assessment & Plan Note (Signed)
As above

## 2010-12-29 NOTE — Assessment & Plan Note (Signed)
I'm not sure of the mechanism of this as noted above. Will use beta blockers to try to slow the heart rate down.Hopefully this will have a beneficial impact on left ventricular function

## 2011-01-03 ENCOUNTER — Encounter: Payer: Self-pay | Admitting: Internal Medicine

## 2011-01-10 ENCOUNTER — Encounter: Payer: Self-pay | Admitting: Internal Medicine

## 2011-02-16 ENCOUNTER — Ambulatory Visit: Payer: Self-pay | Admitting: Internal Medicine

## 2011-05-23 ENCOUNTER — Ambulatory Visit: Payer: Self-pay | Admitting: Internal Medicine

## 2012-02-16 ENCOUNTER — Inpatient Hospital Stay (HOSPITAL_COMMUNITY)
Admission: AD | Admit: 2012-02-16 | Discharge: 2012-02-17 | DRG: 194 | Disposition: A | Discharge status: Home / Self Care | Payer: MEDICAID | Source: Ambulatory Visit | Attending: Family Medicine | Admitting: Family Medicine

## 2012-02-16 ENCOUNTER — Encounter: Payer: Self-pay | Admitting: Family Medicine

## 2012-02-16 ENCOUNTER — Encounter (HOSPITAL_COMMUNITY): Payer: Self-pay | Admitting: General Practice

## 2012-02-16 ENCOUNTER — Ambulatory Visit: Payer: Self-pay

## 2012-02-16 ENCOUNTER — Ambulatory Visit: Payer: Self-pay | Admitting: Family Medicine

## 2012-02-16 VITALS — BP 156/110 | HR 114 | Temp 97.9°F | Resp 24

## 2012-02-16 DIAGNOSIS — I2510 Atherosclerotic heart disease of native coronary artery without angina pectoris: Secondary | ICD-10-CM | POA: Diagnosis present

## 2012-02-16 DIAGNOSIS — I10 Essential (primary) hypertension: Secondary | ICD-10-CM

## 2012-02-16 DIAGNOSIS — Z79899 Other long term (current) drug therapy: Secondary | ICD-10-CM

## 2012-02-16 DIAGNOSIS — IMO0001 Reserved for inherently not codable concepts without codable children: Secondary | ICD-10-CM | POA: Diagnosis present

## 2012-02-16 DIAGNOSIS — E119 Type 2 diabetes mellitus without complications: Secondary | ICD-10-CM

## 2012-02-16 DIAGNOSIS — J189 Pneumonia, unspecified organism: Principal | ICD-10-CM | POA: Diagnosis present

## 2012-02-16 DIAGNOSIS — J45909 Unspecified asthma, uncomplicated: Secondary | ICD-10-CM

## 2012-02-16 DIAGNOSIS — Z23 Encounter for immunization: Secondary | ICD-10-CM

## 2012-02-16 DIAGNOSIS — Z9861 Coronary angioplasty status: Secondary | ICD-10-CM

## 2012-02-16 DIAGNOSIS — Z7982 Long term (current) use of aspirin: Secondary | ICD-10-CM

## 2012-02-16 DIAGNOSIS — J129 Viral pneumonia, unspecified: Secondary | ICD-10-CM

## 2012-02-16 DIAGNOSIS — E785 Hyperlipidemia, unspecified: Secondary | ICD-10-CM | POA: Diagnosis present

## 2012-02-16 DIAGNOSIS — I428 Other cardiomyopathies: Secondary | ICD-10-CM | POA: Diagnosis present

## 2012-02-16 DIAGNOSIS — R0600 Dyspnea, unspecified: Secondary | ICD-10-CM

## 2012-02-16 DIAGNOSIS — R000 Tachycardia, unspecified: Secondary | ICD-10-CM | POA: Diagnosis present

## 2012-02-16 HISTORY — DX: Gastro-esophageal reflux disease without esophagitis: K21.9

## 2012-02-16 HISTORY — DX: Personal history of other diseases of the digestive system: Z87.19

## 2012-02-16 HISTORY — DX: Anxiety disorder, unspecified: F41.9

## 2012-02-16 HISTORY — DX: Obstructive sleep apnea (adult) (pediatric): G47.33

## 2012-02-16 HISTORY — DX: Pneumonia, unspecified organism: J18.9

## 2012-02-16 HISTORY — DX: Dependence on other enabling machines and devices: Z99.89

## 2012-02-16 LAB — BASIC METABOLIC PANEL
BUN: 17 mg/dL (ref 6–23)
CO2: 25 mEq/L (ref 19–32)
Calcium: 9.3 mg/dL (ref 8.4–10.5)
Chloride: 98 mEq/L (ref 96–112)
Creatinine, Ser: 1.04 mg/dL (ref 0.50–1.35)
GFR calc Af Amer: >90 mL/min (ref >90)

## 2012-02-16 LAB — STREP PNEUMONIAE URINARY ANTIGEN: Strep Pneumo Urinary Antigen: NEGATIVE

## 2012-02-16 LAB — CBC WITH DIFFERENTIAL/PLATELET
Basophils Absolute: 0 10*3/uL (ref 0.0–0.1)
Basophils Relative: 0 % (ref 0–1)
Eosinophils Relative: 1 % (ref 0–5)
HCT: 44.3 % (ref 39.0–52.0)
MCHC: 34.1 g/dL (ref 30.0–36.0)
MCV: 96.5 fL (ref 78.0–100.0)
Monocytes Absolute: 0.6 10*3/uL (ref 0.1–1.0)
Platelets: 248 10*3/uL (ref 150–400)
RDW: 13.2 % (ref 11.5–15.5)
WBC: 7.7 10*3/uL (ref 4.0–10.5)

## 2012-02-16 LAB — POCT CBC
MCH, POC: 31.4 pg — AB (ref 27–31.2)
MCV: 102.5 fL — AB (ref 80–97)
MID (cbc): 0.7 (ref 0–0.9)
MPV: 8 fL (ref 0–99.8)
POC LYMPH PERCENT: 22.4 %L (ref 10–50)
POC MID %: 8.4 %M (ref 0–12)
Platelet Count, POC: 344 10*3/uL (ref 142–424)
RBC: 5.44 M/uL (ref 4.69–6.13)
RDW, POC: 14.1 %
WBC: 8.8 10*3/uL (ref 4.6–10.2)

## 2012-02-16 LAB — POCT GLYCOSYLATED HEMOGLOBIN (HGB A1C): Hemoglobin A1C: 9.9

## 2012-02-16 LAB — GLUCOSE, CAPILLARY: Glucose-Capillary: 341 mg/dL — ABNORMAL HIGH (ref 70–99)

## 2012-02-16 MED ORDER — GLIMEPIRIDE 4 MG PO TABS
4.0000 mg | ORAL_TABLET | Freq: Every day | ORAL | Status: DC
  Administered 2012-02-17: 4 mg via ORAL
  Filled 2012-02-16 (×2): qty 1

## 2012-02-16 MED ORDER — SODIUM CHLORIDE 0.9 % IV SOLN
INTRAVENOUS | Status: DC
  Administered 2012-02-16: 17:00:00 via INTRAVENOUS
  Administered 2012-02-17: 50 mL via INTRAVENOUS

## 2012-02-16 MED ORDER — PNEUMOCOCCAL VAC POLYVALENT 25 MCG/0.5ML IJ INJ
0.5000 mL | INJECTION | INTRAMUSCULAR | Status: AC
  Administered 2012-02-17: 0.5 mL via INTRAMUSCULAR
  Filled 2012-02-16: qty 0.5

## 2012-02-16 MED ORDER — SIMVASTATIN 20 MG PO TABS
20.0000 mg | ORAL_TABLET | Freq: Every day | ORAL | Status: DC
  Administered 2012-02-16: 20 mg via ORAL
  Filled 2012-02-16 (×2): qty 1

## 2012-02-16 MED ORDER — ALBUTEROL SULFATE HFA 108 (90 BASE) MCG/ACT IN AERS
2.0000 | INHALATION_SPRAY | RESPIRATORY_TRACT | Status: DC | PRN
  Filled 2012-02-16: qty 6.7

## 2012-02-16 MED ORDER — INSULIN ASPART 100 UNIT/ML ~~LOC~~ SOLN
0.0000 [IU] | Freq: Three times a day (TID) | SUBCUTANEOUS | Status: DC
  Administered 2012-02-16: 11 [IU] via SUBCUTANEOUS
  Administered 2012-02-17 (×2): 8 [IU] via SUBCUTANEOUS

## 2012-02-16 MED ORDER — LEVOFLOXACIN IN D5W 750 MG/150ML IV SOLN
750.0000 mg | INTRAVENOUS | Status: DC
  Administered 2012-02-16: 750 mg via INTRAVENOUS
  Filled 2012-02-16 (×2): qty 150

## 2012-02-16 MED ORDER — METOPROLOL TARTRATE 100 MG PO TABS
100.0000 mg | ORAL_TABLET | Freq: Two times a day (BID) | ORAL | Status: DC
  Administered 2012-02-16 – 2012-02-17 (×2): 100 mg via ORAL
  Filled 2012-02-16 (×3): qty 1

## 2012-02-16 MED ORDER — ASPIRIN EC 81 MG PO TBEC
81.0000 mg | DELAYED_RELEASE_TABLET | Freq: Every day | ORAL | Status: DC
  Administered 2012-02-16 – 2012-02-17 (×2): 81 mg via ORAL
  Filled 2012-02-16 (×2): qty 1

## 2012-02-16 MED ORDER — LISINOPRIL 5 MG PO TABS
5.0000 mg | ORAL_TABLET | Freq: Every day | ORAL | Status: DC
  Administered 2012-02-16 – 2012-02-17 (×2): 5 mg via ORAL
  Filled 2012-02-16 (×2): qty 1

## 2012-02-16 MED ORDER — ACETAMINOPHEN 325 MG PO TABS: 650.0000 mg | ORAL_TABLET | Freq: Four times a day (QID) | ORAL | Status: DC | PRN

## 2012-02-16 MED ORDER — ASPIRIN 81 MG PO TABS: 81.0000 mg | ORAL_TABLET | Freq: Every day | ORAL | Status: DC

## 2012-02-16 MED ORDER — INSULIN ASPART 100 UNIT/ML ~~LOC~~ SOLN
0.0000 [IU] | Freq: Every day | SUBCUTANEOUS | Status: DC
  Administered 2012-02-16: 3 [IU] via SUBCUTANEOUS

## 2012-02-16 MED ORDER — ALBUTEROL SULFATE (2.5 MG/3ML) 0.083% IN NEBU
2.5000 mg | INHALATION_SOLUTION | Freq: Once | RESPIRATORY_TRACT | Status: AC
  Administered 2012-02-16: 2.5 mg via RESPIRATORY_TRACT

## 2012-02-16 NOTE — H&P (Signed)
History and Physical Note Family Medicine Teaching Service Elzora Cullins M. Aloni Chuang, MD Service Pager: [phone]  Vincent Chandler is an 51 y.o. male.   Chief Complaint: cough, wheezing HPI: Patient is a 51 yo M with PMH of DM, HTN, asthma and CAD s/p cath who presented to [facility] with a one week history of progressively worsening SOB and wheeze with activity. Patient states he also felt like he had no energy since yesterday and thought he was having an asthma exacerbation. He went to Urgent Care today to get a refill on his albuterol. In the clinic, he had an O2 sat of 91% on room air. He received a neb treatment x1 with no improvement of his O2 sats. Therefore, a CXR was done which was concerning for multilobar Pnuemonia. Since patient did not have an elevated white count, Dr. Edilia Bo was concerned about an atypical infection such as PCP.   On ROS, patient states he has had a cough x1 week that is nonproductive. No fevers, no chills, no anorexia, no pain in chest. He states he has had an upper airway infection recently but no sick contacts or recent travel. He has not had inhaler to use. Nothing makes his cough/SOB better, but walking makes it worse. He states he has never been tested for HIV and has no risk factors. He states he does not drink heavily, does not smoke and does not use any illicit drugs.  Past Medical History  Diagnosis Date  . Coronary artery disease     drug eluting sten RCA 2005-EF 30% at that time  . Cardiomyopathy     alcohol use related  . Asthma   . Diabetes mellitus type II   . Hypertension   . Urinary incontinence   . Hyperlipidemia   . History of hypogonadism     Past Surgical History  Procedure Date  . Carotid stent     place 8 years ago  . Coronary angioplasty with stent placement   . Tonsillectomy     Family History  Problem Relation Age of Onset  . Hypertension Mother   . Diabetes Mother   . Hypertension Father   . Diabetes Father    . Hyperlipidemia Father    Social History:  reports that he has never smoked. He has never used smokeless tobacco. He reports that he drinks alcohol. He reports that he does not use illicit drugs.  Allergies: No Known Allergies  Medications Prior to Admission  Medication Sig Dispense Refill  . aspirin 81 MG tablet Take 81 mg by mouth daily.      Marland Kitchen atenolol (TENORMIN) 25 MG tablet Take 25 mg by mouth daily.        . candesartan (ATACAND) 32 MG tablet Take 32 mg by mouth daily.        . Choline Fenofibrate (TRILIPIX) 135 MG capsule Take 135 mg by mouth daily.        Marland Kitchen glimepiride (AMARYL) 4 MG tablet Take 4 mg by mouth daily.        Marland Kitchen glucose blood (ONE TOUCH TEST STRIPS) test strip by Other route. Use as instructed       . Insulin Pen Needle 32G X 5 MM MISC Use as directed daily  100 each  3  . Liraglutide 18 MG/3ML SOLN 1.2 mg SQ qd  18 mL  3  . lisinopril (PRINIVIL,ZESTRIL) 5 MG tablet Take 1 tablet (5 mg total) by mouth daily.  90 tablet  3  .  lisinopril (PRINIVIL,ZESTRIL) 5 MG tablet Take 5 mg by mouth daily.      . metFORMIN (GLUMETZA) 1000 MG (MOD) 24 hr tablet Take 1,000 mg by mouth daily.        . metoprolol (LOPRESSOR) 100 MG tablet Take 1 tablet (100 mg total) by mouth 2 (two) times daily.  60 tablet  6  . metoprolol (LOPRESSOR) 100 MG tablet Take 100 mg by mouth 2 (two) times daily.      . pravastatin (PRAVACHOL) 40 MG tablet Take 1 tablet (40 mg total) by mouth every evening.  90 tablet  3  . pravastatin (PRAVACHOL) 40 MG tablet Take 40 mg by mouth daily.        Results for orders placed during the hospital encounter of 02/16/12 (from the past 48 hour(s))  GLUCOSE, CAPILLARY     Status: Abnormal   Collection Time   02/16/12  5:04 PM      Component Value Range Comment   Glucose-Capillary 341 (*) 70 - 99 mg/dL    Comment 1 Documented in Chart      Comment 2 Notify RN      Dg Chest 2 View  02/16/2012  *RADIOLOGY REPORT*  Clinical Data: Cough.  Shortness breath.  Wheezing.   Reactive airway disease.  CHEST - 2 VIEW  Comparison: None.  Findings: Air space disease is seen in both the right upper and lower lobes, and to a lesser degree in the retrocardiac left lower lobe.  This is consistent with multilobar pneumonia.  No evidence of pleural effusion.  Heart size is normal.  IMPRESSION: Right upper and bilateral lower lobe infiltrates, consistent with multilobar pneumonia. Post-treatment  radiographic followup recommended to confirm resolution.  Original Report Authenticated By: Marlaine Hind, M.D.   ROS See HPI above  Blood pressure 156/104, pulse 120, temperature 98.2 F (36.8 C), temperature source Oral, resp. rate 20, height _0  (1.753 m), weight 230 lb 11.2 oz (104.645 kg), SpO2 91.00%. Physical Exam  Constitutional: He is oriented to person, place, and time. He appears well-developed and well-nourished. No distress.  HENT:  Head: Normocephalic and atraumatic.  Mouth/Throat: Oropharynx is clear and moist.  Neck: Normal range of motion.  Cardiovascular: Regular rhythm.   No murmur heard.      Tachycardic  Respiratory: Effort normal and breath sounds normal.       Decreased air movement at bases  GI: Soft. There is no tenderness.       Obese  Musculoskeletal: Normal range of motion. He exhibits no edema.  Lymphadenopathy:    He has no cervical adenopathy.  Neurological: He is alert and oriented to person, place, and time. No cranial nerve deficit.  Skin: Skin is warm and dry.    Assessment/Plan 51 yo M with DM, HTN, CAD, obesity presenting with cough, SOB and fatigue found to have multilobar pneumonia  #Pneumonia- Multilobar seen on CXR from Urgent care. Patient does not have fevers, leukocytosis or tachypnea but does have cough and hypoxia on room air to the low 90's. - Admit to telemetry, attending Dr. Erin Hearing - Levaquin IV for community acquired pneumonia - Will check Legionella and Strep pneumo antigens - Will repeat CXR tomorrow - Blood  cultures order, but due to order mistake, they were drawn shortly after first dose of antibiotics. Will redraw blood cultures if patient is febrile at any point - Albuterol q4 hours prn for wheezing - Will check HIV due to concern of not mounting appropriate immune response. Patient  is aware of this test. - Did not add Bactrim for PCP coverage, although this should be considered - AM labs ordered - O2 as needed to maintain O2 >90%. Patient states he "sometimes" wears Cpap at home, and therefore may require O2 overnight  # DM- A1C 9.9 in clinic today. Has not been on insulin in >6 months - Resistant SSI plus night time insulin coverage - CBG qid  # HTN- On antihypertensives at home.  - Resume home medications. Will hold if hypotensive. If hypotensive, consider sepsis  FEN/GI- Carb modified diet PPx- SCD and Out of bed Dispo- Pending further workup and clinical improvement of pneumonia. Patient updated on plan at bedside on admission.  Rand Etchison 02/16/2012, 5:58 PM

## 2012-02-16 NOTE — Patient Instructions (Addendum)
Please go to [facility]- "Admitting."  Your room number will be 5500, and you are being admitted by the [facility] Service.

## 2012-02-16 NOTE — Progress Notes (Signed)
Urgent [facility] and [facility] [address], [place] [address] [phone]  Date:  02/16/2012   Name:  Vincent Chandler   DOB:  [birth year]   MRN:  [id number]  PCP:  Garnet Koyanagi, DO    Chief Complaint: No chief complaint on file.   History of Present Illness:  Vincent Chandler is a 51 y.o. very pleasant male patient who presents with the following:  He has a history of occasional RAD.  He does not have any albuterol at home.  He has noted SOB, a mild cough and wheezing.  No fever that he has noted.  He does have a history of CAD and stent, but he is no longer on plavix.  He is supposed to be on insulin, but has run out and not taken for about 6 months.  Never been a smoker.    Patient Active Problem List  Diagnosis  . DIABETES MELLITUS, TYPE II  . HYPERLIPIDEMIA  . HYPERTENSION  . ASTHMA  . MYALGIA  . URINARY INCONTINENCE  . Coronary artery disease prior RCA stent with new inferior Q waves  . Ischemic and nonischemic cardiomyopathy EF 35% 2006  . Sinus tachycardia    Past Medical History  Diagnosis Date  . Coronary artery disease     drug eluting sten RCA 2005-EF 30% at that time  . Cardiomyopathy     alcohol use related  . Asthma   . Diabetes mellitus type II   . Hypertension   . Urinary incontinence   . Hyperlipidemia   . History of hypogonadism     Past Surgical History  Procedure Date  . Carotid stent     place 8 years ago  . Coronary angioplasty with stent placement   . Tonsillectomy     History  Substance Use Topics  . Smoking status: Never Smoker   . Smokeless tobacco: Never Used  . Alcohol Use: Yes    Family History  Problem Relation Age of Onset  . Hypertension Mother   . Diabetes Mother   . Hypertension Father   . Diabetes Father   . Hyperlipidemia Father     No Known Allergies  Medication list has been reviewed and updated.  Current Outpatient Prescriptions on File Prior to Visit  Medication Sig Dispense Refill  .  atenolol (TENORMIN) 25 MG tablet Take 25 mg by mouth daily.        . candesartan (ATACAND) 32 MG tablet Take 32 mg by mouth daily.        . Choline Fenofibrate (TRILIPIX) 135 MG capsule Take 135 mg by mouth daily.        . clopidogrel (PLAVIX) 75 MG tablet Take 75 mg by mouth daily.        . furosemide (LASIX) 40 MG tablet Take 40 mg by mouth daily.        Marland Kitchen glimepiride (AMARYL) 4 MG tablet Take 4 mg by mouth daily.        Marland Kitchen glucose blood (ONE TOUCH TEST STRIPS) test strip by Other route. Use as instructed       . Insulin Pen Needle 32G X 5 MM MISC Use as directed daily  100 each  3  . Liraglutide 18 MG/3ML SOLN 1.2 mg SQ qd  18 mL  3  . lisinopril (PRINIVIL,ZESTRIL) 5 MG tablet Take 1 tablet (5 mg total) by mouth daily.  90 tablet  3  . metFORMIN (GLUMETZA) 1000 MG (MOD) 24 hr tablet Take 1,000  mg by mouth daily.        . metoprolol (LOPRESSOR) 100 MG tablet Take 1 tablet (100 mg total) by mouth 2 (two) times daily.  60 tablet  6  . pravastatin (PRAVACHOL) 40 MG tablet Take 1 tablet (40 mg total) by mouth every evening.  90 tablet  3    Review of Systems:  As per HPI- otherwise negative.   Physical Examination: There were no vitals filed for this visit. There were no vitals filed for this visit. There is no height or weight on file to calculate BMI. Ideal Body Weight:   See VS  GEN: WDWN, NAD, Non-toxic, A & O x 3, obese HEENT: Atraumatic, Normocephalic. Neck supple. No masses, No LAD.  Tm and oropharynx wnl.   Ears and Nose: No external deformity. CV: RRR, No M/G/R. No JVD. No thrill. No extra heart sounds. PULM: decreased air movement bilaterally, a little better after her albuterol neb.  No crackles or wheezes. No accessory muscle use. ABD: S, NT, ND, +BS. No rebound. No HSM. EXTR: No c/c/e NEURO Normal gait.  PSYCH: Normally interactive. Conversant. Not depressed or anxious appearing.  Calm demeanor.   Results for orders placed in visit on 02/16/12  POCT GLYCOSYLATED  HEMOGLOBIN (HGB A1C)      Component Value Range   Hemoglobin A1C 9.9    POCT CBC      Component Value Range   WBC 8.8  4.6 - 10.2 K/uL   Lymph, poc 2.0  0.6 - 3.4   POC LYMPH PERCENT 22.4  10 - 50 %L   MID (cbc) 0.7  0 - 0.9   POC MID % 8.4  0 - 12 %M   POC Granulocyte 6.1  2 - 6.9   Granulocyte percent 69.2  37 - 80 %G   RBC 5.44  4.69 - 6.13 M/uL   Hemoglobin 17.1  14.1 - 18.1 g/dL   HCT, POC 55.8 (*) 43.5 - 53.7 %   MCV 102.5 (*) 80 - 97 fL   MCH, POC 31.4 (*) 27 - 31.2 pg   MCHC 30.6 (*) 31.8 - 35.4 g/dL   RDW, POC 14.1     Platelet Count, POC 344  142 - 424 K/uL   MPV 8.0  0 - 99.8 fL   Albuterol neb- helped only while he was actually taking the neb, then he felt just as bad.  [facility] reading (PRIMARY) by  Dr. Lorelei Pont.  Diffuse right sided pneumonia- patchy  Assessment and Plan: 1. RAD (reactive airway disease)  albuterol (PROVENTIL) (2.5 MG/3ML) 0.083% nebulizer solution 2.5 mg, DG Chest 2 View  2. DM (diabetes mellitus)  POCT glycosylated hemoglobin (Hb A1C)  3. Pneumonia  POCT CBC   Pneumonia- I am concerned about this gentleman's severe x-ray findings and lack of apparent immune response.  ?consider HIV/ PCP.  Needs admission for supportive care, O2 and further treatment.  Arranged direct admit with FP service- appreciate their care of this patient.    Despite being off his DM therapy his A1c is not horrible.   May want to start him on metformin as cost is an issue for him.   Gave him some samples of albuterol neb that we had on hand- he does not have insurance, but does have a nebulizer machine.   Lamar Blinks, MD

## 2012-02-17 ENCOUNTER — Inpatient Hospital Stay (HOSPITAL_COMMUNITY): Payer: Self-pay

## 2012-02-17 LAB — CBC
Hemoglobin: 15.2 g/dL (ref 13.0–17.0)
MCHC: 34 g/dL (ref 30.0–36.0)
RDW: 13.3 % (ref 11.5–15.5)

## 2012-02-17 LAB — BASIC METABOLIC PANEL
BUN: 16 mg/dL (ref 6–23)
Creatinine, Ser: 0.91 mg/dL (ref 0.50–1.35)
GFR calc Af Amer: >90 mL/min (ref >90)
GFR calc non Af Amer: >90 mL/min (ref >90)
Potassium: 4.2 mEq/L (ref 3.5–5.1)

## 2012-02-17 LAB — HIV ANTIBODY (ROUTINE TESTING W REFLEX): HIV: NONREACTIVE

## 2012-02-17 MED ORDER — LEVOFLOXACIN 750 MG PO TABS
750.0000 mg | ORAL_TABLET | Freq: Every day | ORAL | Status: DC
  Administered 2012-02-17: 750 mg via ORAL
  Filled 2012-02-17: qty 1

## 2012-02-17 MED ORDER — LEVOFLOXACIN 750 MG PO TABS: ORAL_TABLET | ORAL | Status: DC

## 2012-02-17 NOTE — Progress Notes (Signed)
PGY-1 Daily Progress Note Family Medicine Teaching Service [name] M. [name], MD Service Pager: [phone]   Subjective: Patient doing well this morning. Did require supplemental O2 yesterday and Cpap overnight. He denies fevers, chills, SOB, CP. He continues to have a dry cough and somewhat of a sore throat. Otherwise, he feels well.  Objective: Vital signs in last 24 hours: Temp:  [97.9 F (36.6 C)-98.3 F (36.8 C)] 98.3 F (36.8 C) (08/17 0539) Pulse Rate:  [94-120] 94  (08/17 0539) Resp:  [20-24] 20  (08/17 0539) BP: (114-156)/(78-110) 114/78 mmHg (08/17 0539) SpO2:  [91 %-96 %] 94 % (08/17 0539) Weight:  [230 lb 11.2 oz (104.645 kg)] 230 lb 11.2 oz (104.645 kg) (08/16 1516) Weight change:    Intake/Output from previous day: 08/16 0701 - 08/17 0700 In: 430.8 [P.O.:340; I.V.:90.8] Out: -  Intake/Output this shift:   Constitutional: Sitting up in bed. No distress.  HENT: Normocephalic and atraumatic. Oropharynx is clear and moist.  Neck: Normal range of motion.  Cardiovascular: Regular rate and rhythm. No murmur heard.   Respiratory: Mild tachypnea.  Effort normal and breath sounds normal. Decreased air movement at bases with expiratory wheeze noted on right. GI: Soft, obese non tender  Musculoskeletal: Normal range of motion. He exhibits no edema.  Lymphadenopathy: He has no cervical adenopathy.  Neurological: Grossly intact Skin: Skin is warm and dry.   Lab Results:  Basename 02/16/12 1733 02/16/12 1236  WBC 7.7 8.8  HGB 15.1 17.1  HCT 44.3 55.8*  PLT 248 --   BMET  Basename 02/16/12 1733  NA 135  K 3.8  CL 98  CO2 25  GLUCOSE 369*  BUN 17  CREATININE 1.04  CALCIUM 9.3   HIV: Not reactive  Studies/Results: Dg Chest 2 View  02/16/2012  *RADIOLOGY REPORT*  Clinical Data: Cough.  Shortness breath.  Wheezing.  Reactive airway disease.  CHEST - 2 VIEW  Comparison: None.  Findings: Air space disease is seen in both the right upper and lower lobes, and to a  lesser degree in the retrocardiac left lower lobe.  This is consistent with multilobar pneumonia.  No evidence of pleural effusion.  Heart size is normal.  IMPRESSION: Right upper and bilateral lower lobe infiltrates, consistent with multilobar pneumonia. Post-treatment  radiographic followup recommended to confirm resolution.  Original Report Authenticated By: Marlaine Hind, M.D.   Medications:  I have reviewed the patient's current medications. Scheduled:   . aspirin EC  81 mg Oral Daily  . glimepiride  4 mg Oral Q breakfast  . insulin aspart  0-15 Units Subcutaneous TID WC  . insulin aspart  0-5 Units Subcutaneous QHS  . levofloxacin (LEVAQUIN) IV  750 mg Intravenous Q24H  . lisinopril  5 mg Oral Daily  . metoprolol  100 mg Oral BID  . pneumococcal 23 valent vaccine  0.5 mL Intramuscular Tomorrow-1000  . simvastatin  20 mg Oral q1800  . DISCONTD: aspirin  81 mg Oral Daily   Continuous:   . sodium chloride 50 mL/hr at 02/16/12 1707   [id number], albuterol  Assessment/Plan: 51 yo M with DM, HTN, CAD, obesity presenting with cough, SOB and fatigue found to have multilobar pneumonia   #Pneumonia- Multilobar seen on CXR from Urgent care. Patient does not have fevers, leukocytosis or tachypnea but does have cough and hypoxia on room air to the low 90's.  - Continue Levaquin IV for community acquired pneumonia. Will transition to PO Levaquin this afternoon which he will continue for one  week as an outpatient. - Strep pneumo antigen negative. Awaiting Legionella  - Repeat CXR shows patchy infiltrates, largely uncharged from Xray done at [facility] yesterday - Blood cultures order, but due to order mistake, they were drawn shortly after first dose of antibiotics. Will redraw blood cultures if patient is febrile at any point.  - Albuterol q4 hours prn for wheezing  - HIV negative with decreases likelihood of PCP.  Did not add Bactrim for PCP coverage at admission - AM labs unremarkable.  No leukocytosis. - O2 as needed to maintain O2 >90% which patient requires at this time. Also used CPap overnight.  # DM- A1C 9.9 on day of admission. Has not been on insulin in >6 months  - Resistant SSI plus night time insulin coverage  - CBG qid  - Diabetes education - Will need close follow up with Dr. Edilia Bo to get back on a diabetes regimen  # HTN- On antihypertensives at home although not sure if patient is compliant - Resumed home medications at admission. BP 114/72 this morning and tachycardic - Will d/c Lisinopril for now.  - If becomes more hypotensive, consider sepsis   FEN/GI- Carb modified diet  PPx- Heparin sq and Out of bed  Dispo- Pending further workup and clinical improvement of pneumonia. Patient updated on plan at bedside. Will need to be tolerating PO antibiotics and be off supplemental O2 while ambulating in order to be discharged, which I anticipate within the next 24-48 hours.   LOS: 1 day   Vincent Chandler 02/17/2012, 7:15 AM

## 2012-02-17 NOTE — Progress Notes (Signed)
Pt ambulated 350 ft on room air . O2 sat started at 91% but went up to 100% mid way through ambulation. Stayed at 100% and continued at that level after returning to room. Pt is at 93% after returning to room for 5 minutes.  Devoria Albe RN

## 2012-02-17 NOTE — H&P (Signed)
Family Medicine Teaching Service Attending Note  On 8-16 I interviewed and examined patient Vincent Chandler and reviewed their tests and x-rays.  I discussed with Dr. Sheral Apley and reviewed their note for today.  I agree with their assessment and plan.     Additionally  On admission patient is no acute distress.  Has dry cough with mild shortness of breath.  Denies fevers or sputum or exposure to dusts, birds, old barns etc.  No rashes and no travel  Likely an atypical pneumonia.  No signs of immunocompromise or cancer Monitor saturations over night and start atypical coverage

## 2012-02-17 NOTE — Progress Notes (Signed)
Vincent Chandler to be D/C'd Home per MD order.  Discussed with the patient and all questions fully answered.   [name], [name]  Home Medication Instructions EXN:[id number]   Printed on:02/17/12 1655  Medication Information                    lisinopril (PRINIVIL,ZESTRIL) 5 MG tablet Take 5 mg by mouth daily.           metoprolol (LOPRESSOR) 100 MG tablet Take 100 mg by mouth 2 (two) times daily.           pravastatin (PRAVACHOL) 40 MG tablet Take 40 mg by mouth daily.           aspirin EC 81 MG tablet Take 81 mg by mouth daily.           levofloxacin (LEVAQUIN) 750 MG tablet For 8 days for your pneumonia.             VVS, Skin clean, dry and intact without evidence of skin break down, no evidence of skin tears noted. IV catheter discontinued intact. Site without signs and symptoms of complications. Dressing and pressure applied.  An After Visit Summary was printed and given to the patient. Follow up appointments , new prescriptions and medication administration times given. Pneumonia handout given to patient and teach back. Patient escorted via [place], and D/C home via private auto.  Park Breed, RN 02/17/2012 4:55 PM

## 2012-02-17 NOTE — Progress Notes (Signed)
Family Medicine Teaching Service Attending Note  I interviewed and examined patient Vincent Chandler and reviewed their tests and x-rays.  I discussed with Dr. Sheral Apley and reviewed their note for today.  I agree with their assessment and plan.     Additionally  This AM feels well except for mild cough Sating 92-95 on RA while talking Recommend walking without O2 to make sure can maintain oxygenation.   If can ok to discharge on oral antibiotics with follow up to ensure the infiltrate clears completely

## 2012-02-18 ENCOUNTER — Other Ambulatory Visit: Payer: Self-pay | Admitting: Family Medicine

## 2012-02-18 ENCOUNTER — Other Ambulatory Visit: Payer: Self-pay | Admitting: Internal Medicine

## 2012-02-18 LAB — LEGIONELLA ANTIGEN, URINE

## 2012-02-19 NOTE — Discharge Summary (Signed)
Physician Discharge Summary  Patient ID: Vincent Chandler MRN: [id number] DOB/AGE: [birth year] 51 y.o.  Admit date: 02/16/2012 Discharge date: 02/19/2012  Admission Diagnoses: Shortness of breath and cough  Discharge Diagnoses:  Principal Problem:  *Pneumonia Active Problems:  DIABETES MELLITUS, TYPE II  HYPERTENSION   Discharged Condition: stable  Hospital Course: Patient is a 51 yo M with uncontrolled HTN, uncontrolled DM, and asthma who presented to [place] [facility] with 3 day history of worsening shortness of breath and cough. Patient reports a 1 week history of nonspecific upper airway complaints, but no other illness. At [facility], he was found to have O2 sat of 90% on room air and a chest X-ray that showed multilobar pneumonia. He was admitted for overnight observation and further work up of his pneumonia. He was afebrile and did not have an elevated white count. Overall, he clinically looked well. He was started on Levaquin IV for CAP coverage. He had a negative strep pneumo antigen, and a negative legionella. It was thought that he could possibly have PCP given the fact that it appeared he was not having an adequate response, but his HIV was negative therefore Bactrim was not added.  During the admission, he did require supplemental O2 while asleep as well as a CPAP overnight. By the following day, he was able to wean off the oxygen and it was felt that this CAP vs atypical vs viral pneumonia could be treated as an outpatient. He was transitioned to PO Levaquin to completed an additional 8 days (10 days total.) He will follow up with Dr. Edilia Bo at [place]. It is recommended he have a repeat CXR in 6 weeks to evaluate for resolution of pneumonia.  Also during this hospital admission he was started back on antihypertensives and insulin. His blood pressure improved, and he will continue antihypertensives as an outpatient. He was not taking diabetes medicine and his A1C was noted  to be >9.0. He states he will restart these at discharge. Will follow up with Dr. Edilia Bo.  Consults: None  Significant Diagnostic Studies:    02/17/2012 08:31  Sodium 136  Potassium 4.2  Chloride 100  CO2 25  BUN 16  Creatinine 0.91  Calcium 9.0  GFR calc non Af Amer >90  GFR calc Af Amer >90  Glucose 320 (H)  WBC 8.0  RBC 4.59  Hemoglobin 15.2  HCT 44.7  MCV 97.4  MCH 33.1  MCHC 34.0  RDW 13.3  Platelets 239   CXR: IMPRESSION: Patchy bilateral airspace disease, worse on the right , most  consistent with multifocal pneumonia. Recommend follow-up films to  clearing.  Treatments: IV hydration and antibiotics: Levaquin  Discharge Exam: Blood pressure 107/73, pulse 75, temperature 98 F (36.7 C), temperature source Oral, resp. rate 20, height _0  (1.753 m), weight 104.645 kg (230 lb 11.2 oz), SpO2 97.00%.  Disposition: 01-Home or Self Care  Discharge Orders    Future Orders Please Complete By Expires   Discharge patient      Comments:   After he receives today's Levaquin dose. He may get it now.     Medication List  As of 02/19/2012  2:21 PM   TAKE these medications         aspirin EC 81 MG tablet   Take 81 mg by mouth daily.      levofloxacin 750 MG tablet   Commonly known as: LEVAQUIN   For 8 days for your pneumonia.      lisinopril 5  MG tablet   Commonly known as: PRINIVIL,ZESTRIL   Take 5 mg by mouth daily.      metoprolol 100 MG tablet   Commonly known as: LOPRESSOR   Take 100 mg by mouth 2 (two) times daily.      pravastatin 40 MG tablet   Commonly known as: PRAVACHOL   Take 40 mg by mouth daily.           Follow-up Information    Please follow up. (Call on Monday and schedule a follow-up appointment at [facility] for sometime that week)         Recommendations for follow up: - Monitor blood pressure and encourage patient to take medications - Monitor diabetes. Patient states he will restart medications at discharge - Consider repeat CXR  in 6-8 weeks to evaluate for improvement/resolution of pneumonia  Signed: Domenick Quebedeaux 02/19/2012, 2:21 PM

## 2012-02-19 NOTE — Discharge Summary (Signed)
I have reviewed this discharge summary and agree.    

## 2012-02-23 LAB — CULTURE, BLOOD (ROUTINE X 2): Culture: NO GROWTH

## 2012-05-19 ENCOUNTER — Other Ambulatory Visit: Payer: Self-pay | Admitting: Family Medicine

## 2012-05-20 NOTE — Telephone Encounter (Signed)
Please schedule a CPE.      KP

## 2012-05-20 NOTE — Telephone Encounter (Signed)
Please offer this patient an apt.      KP

## 2012-05-20 NOTE — Telephone Encounter (Signed)
Last seen 11/03/2010. No pending appiontments. Please advise     KP

## 2012-05-20 NOTE — Telephone Encounter (Signed)
Per KP needs a physical

## 2012-05-20 NOTE — Telephone Encounter (Signed)
Called only # listed LMOM to schedule --15-minute follow up  As per AVS- Return in about 3 months (around 02/03/2011), per AVS from 11/03/11

## 2012-06-03 ENCOUNTER — Encounter (HOSPITAL_BASED_OUTPATIENT_CLINIC_OR_DEPARTMENT_OTHER): Payer: Self-pay | Admitting: *Deleted

## 2012-06-03 ENCOUNTER — Emergency Department (HOSPITAL_BASED_OUTPATIENT_CLINIC_OR_DEPARTMENT_OTHER): Payer: Self-pay

## 2012-06-03 ENCOUNTER — Inpatient Hospital Stay (HOSPITAL_BASED_OUTPATIENT_CLINIC_OR_DEPARTMENT_OTHER)
Admission: EM | Admit: 2012-06-03 | Discharge: 2012-06-08 | DRG: 291 | Disposition: A | Discharge status: Home / Self Care | Payer: Self-pay | Attending: Internal Medicine | Admitting: Internal Medicine

## 2012-06-03 DIAGNOSIS — G4733 Obstructive sleep apnea (adult) (pediatric): Secondary | ICD-10-CM | POA: Diagnosis present

## 2012-06-03 DIAGNOSIS — E785 Hyperlipidemia, unspecified: Secondary | ICD-10-CM | POA: Diagnosis present

## 2012-06-03 DIAGNOSIS — I426 Alcoholic cardiomyopathy: Secondary | ICD-10-CM | POA: Diagnosis present

## 2012-06-03 DIAGNOSIS — I255 Ischemic cardiomyopathy: Secondary | ICD-10-CM

## 2012-06-03 DIAGNOSIS — F17200 Nicotine dependence, unspecified, uncomplicated: Secondary | ICD-10-CM | POA: Diagnosis present

## 2012-06-03 DIAGNOSIS — E119 Type 2 diabetes mellitus without complications: Secondary | ICD-10-CM

## 2012-06-03 DIAGNOSIS — N200 Calculus of kidney: Secondary | ICD-10-CM | POA: Diagnosis present

## 2012-06-03 DIAGNOSIS — I161 Hypertensive emergency: Secondary | ICD-10-CM | POA: Diagnosis present

## 2012-06-03 DIAGNOSIS — Z79899 Other long term (current) drug therapy: Secondary | ICD-10-CM

## 2012-06-03 DIAGNOSIS — IMO0001 Reserved for inherently not codable concepts without codable children: Secondary | ICD-10-CM | POA: Diagnosis present

## 2012-06-03 DIAGNOSIS — Z9119 Patient's noncompliance with other medical treatment and regimen: Secondary | ICD-10-CM

## 2012-06-03 DIAGNOSIS — Z8701 Personal history of pneumonia (recurrent): Secondary | ICD-10-CM

## 2012-06-03 DIAGNOSIS — K449 Diaphragmatic hernia without obstruction or gangrene: Secondary | ICD-10-CM | POA: Diagnosis present

## 2012-06-03 DIAGNOSIS — N201 Calculus of ureter: Secondary | ICD-10-CM | POA: Diagnosis present

## 2012-06-03 DIAGNOSIS — Z9089 Acquired absence of other organs: Secondary | ICD-10-CM

## 2012-06-03 DIAGNOSIS — N179 Acute kidney failure, unspecified: Secondary | ICD-10-CM | POA: Diagnosis present

## 2012-06-03 DIAGNOSIS — N1330 Unspecified hydronephrosis: Secondary | ICD-10-CM | POA: Diagnosis present

## 2012-06-03 DIAGNOSIS — K219 Gastro-esophageal reflux disease without esophagitis: Secondary | ICD-10-CM | POA: Diagnosis present

## 2012-06-03 DIAGNOSIS — I10 Essential (primary) hypertension: Secondary | ICD-10-CM | POA: Diagnosis present

## 2012-06-03 DIAGNOSIS — F411 Generalized anxiety disorder: Secondary | ICD-10-CM | POA: Diagnosis present

## 2012-06-03 DIAGNOSIS — Y92009 Unspecified place in unspecified non-institutional (private) residence as the place of occurrence of the external cause: Secondary | ICD-10-CM

## 2012-06-03 DIAGNOSIS — J9600 Acute respiratory failure, unspecified whether with hypoxia or hypercapnia: Secondary | ICD-10-CM | POA: Diagnosis present

## 2012-06-03 DIAGNOSIS — J45909 Unspecified asthma, uncomplicated: Secondary | ICD-10-CM | POA: Diagnosis present

## 2012-06-03 DIAGNOSIS — I2589 Other forms of chronic ischemic heart disease: Secondary | ICD-10-CM | POA: Diagnosis present

## 2012-06-03 DIAGNOSIS — I509 Heart failure, unspecified: Secondary | ICD-10-CM | POA: Diagnosis present

## 2012-06-03 DIAGNOSIS — R0902 Hypoxemia: Secondary | ICD-10-CM | POA: Diagnosis present

## 2012-06-03 DIAGNOSIS — Z23 Encounter for immunization: Secondary | ICD-10-CM

## 2012-06-03 DIAGNOSIS — Z7982 Long term (current) use of aspirin: Secondary | ICD-10-CM

## 2012-06-03 DIAGNOSIS — I2510 Atherosclerotic heart disease of native coronary artery without angina pectoris: Secondary | ICD-10-CM | POA: Diagnosis present

## 2012-06-03 DIAGNOSIS — T502X5A Adverse effect of carbonic-anhydrase inhibitors, benzothiadiazides and other diuretics, initial encounter: Secondary | ICD-10-CM | POA: Diagnosis present

## 2012-06-03 DIAGNOSIS — IMO0002 Reserved for concepts with insufficient information to code with codable children: Secondary | ICD-10-CM | POA: Diagnosis present

## 2012-06-03 DIAGNOSIS — I5023 Acute on chronic systolic (congestive) heart failure: Principal | ICD-10-CM | POA: Diagnosis present

## 2012-06-03 DIAGNOSIS — E1165 Type 2 diabetes mellitus with hyperglycemia: Secondary | ICD-10-CM | POA: Diagnosis present

## 2012-06-03 DIAGNOSIS — Z9861 Coronary angioplasty status: Secondary | ICD-10-CM

## 2012-06-03 DIAGNOSIS — Z91199 Patient's noncompliance with other medical treatment and regimen due to unspecified reason: Secondary | ICD-10-CM

## 2012-06-03 LAB — COMPREHENSIVE METABOLIC PANEL
ALT: 26 U/L (ref 0–53)
Alkaline Phosphatase: 82 U/L (ref 39–117)
Chloride: 100 mEq/L (ref 96–112)
GFR calc Af Amer: 80 mL/min — ABNORMAL LOW (ref >90)
Glucose, Bld: 394 mg/dL — ABNORMAL HIGH (ref 70–99)
Potassium: 4.4 mEq/L (ref 3.5–5.1)
Sodium: 133 mEq/L — ABNORMAL LOW (ref 135–145)
Total Bilirubin: 0.7 mg/dL (ref 0.3–1.2)
Total Protein: 7.6 g/dL (ref 6.0–8.3)

## 2012-06-03 LAB — CBC WITH DIFFERENTIAL/PLATELET
Basophils Absolute: 0 10*3/uL (ref 0.0–0.1)
Basophils Relative: 0 % (ref 0–1)
Eosinophils Absolute: 0.1 10*3/uL (ref 0.0–0.7)
Eosinophils Relative: 1 % (ref 0–5)
HCT: 44.8 % (ref 39.0–52.0)
Hemoglobin: 15.7 g/dL (ref 13.0–17.0)
Lymphocytes Relative: 10 % — ABNORMAL LOW (ref 12–46)
Lymphs Abs: 1 10*3/uL (ref 0.7–4.0)
MCH: 32.9 pg (ref 26.0–34.0)
MCHC: 35 g/dL (ref 30.0–36.0)
MCV: 93.9 fL (ref 78.0–100.0)
Monocytes Absolute: 0.7 10*3/uL (ref 0.1–1.0)
Monocytes Relative: 7 % (ref 3–12)
Neutro Abs: 8.1 10*3/uL — ABNORMAL HIGH (ref 1.7–7.7)
Neutrophils Relative %: 83 % — ABNORMAL HIGH (ref 43–77)
Platelets: 216 10*3/uL (ref 150–400)
RBC: 4.77 MIL/uL (ref 4.22–5.81)
RDW: 15 % (ref 11.5–15.5)
WBC: 9.7 10*3/uL (ref 4.0–10.5)

## 2012-06-03 LAB — URINE MICROSCOPIC-ADD ON

## 2012-06-03 LAB — URINALYSIS, ROUTINE W REFLEX MICROSCOPIC
Bilirubin Urine: NEGATIVE
Glucose, UA: >1000 mg/dL — AB
Ketones, ur: NEGATIVE mg/dL
pH: 5.5 (ref 5.0–8.0)

## 2012-06-03 LAB — TROPONIN I: Troponin I: <0.3 ng/mL (ref <0.30)

## 2012-06-03 MED ORDER — SODIUM CHLORIDE 0.9 % IV SOLN: Freq: Once | INTRAVENOUS | Status: DC

## 2012-06-03 MED ORDER — HYDROMORPHONE HCL PF 1 MG/ML IJ SOLN
1.0000 mg | Freq: Once | INTRAMUSCULAR | Status: AC
  Administered 2012-06-03: 1 mg via INTRAVENOUS
  Filled 2012-06-03: qty 1

## 2012-06-03 MED ORDER — LABETALOL HCL 5 MG/ML IV SOLN
20.0000 mg | Freq: Once | INTRAVENOUS | Status: AC
  Administered 2012-06-04: 20 mg via INTRAVENOUS
  Filled 2012-06-03: qty 4

## 2012-06-03 MED ORDER — ONDANSETRON HCL 4 MG/2ML IJ SOLN
4.0000 mg | Freq: Once | INTRAMUSCULAR | Status: AC
  Administered 2012-06-03: 4 mg via INTRAVENOUS
  Filled 2012-06-03: qty 2

## 2012-06-03 MED ORDER — SODIUM CHLORIDE 0.9 % IV BOLUS (SEPSIS)
1000.0000 mL | Freq: Once | INTRAVENOUS | Status: AC
  Administered 2012-06-03: 1000 mL via INTRAVENOUS

## 2012-06-03 MED ORDER — HYDROMORPHONE HCL PF 1 MG/ML IJ SOLN
1.0000 mg | Freq: Once | INTRAMUSCULAR | Status: AC
  Administered 2012-06-04: 1 mg via INTRAVENOUS
  Filled 2012-06-03: qty 1

## 2012-06-03 NOTE — ED Notes (Addendum)
Pt c/o lower back pain , SOB and nausea x 5 hrs , HX pneumonia

## 2012-06-03 NOTE — ED Provider Notes (Signed)
History   This chart was scribed for Charles B. Karle Starch, MD by Adriana Reams, ED Scribe. This patient was seen in room MH04/MH04 and the patient's care was started at 21:10.   CSN: [id number]  Arrival date & time 06/03/12  2156   First MD Initiated Contact with Patient 06/03/12 2210      Chief Complaint  Patient presents with  . Back Pain    (Consider location/radiation/quality/duration/timing/severity/associated sxs/prior treatment) The history is provided by the patient. No language interpreter was used.   Vincent Chandler is a 51 y.o. male who presents to the Emergency Department complaining of gradual onset, sharp, constant left flank pain that radiates to his left groin area and began today. He reports nausea and difficulty urinating. He denies diarrhrea, pain in upper back, or CP. He has not had a previous similar episode. He has a history of kidney stones but reports the last episode was "several years ago."  He has a history of HTN but has not been taking his medication for the past 2 days because he ran out. He also reports SOB that he describes as similar to an episode of pneumonia he was recently admitted for 2 months ago but denies cough or fever.  [facility] is PCP.  Past Medical History  Diagnosis Date  . Coronary artery disease     drug eluting sten RCA 2005-EF 30% at that time  . Cardiomyopathy     alcohol use related  . Asthma   . Hypertension   . Urinary incontinence   . Hyperlipidemia   . History of hypogonadism   . Pneumonia 02/16/2012    "today makes third time in my life"  . OSA on CPAP 02/16/2012    "wear mask sometimes"  . Diabetes mellitus type II   . H/O hiatal hernia   . GERD (gastroesophageal reflux disease)   . Migraines 02/16/2012    "used to"  . Anxiety     Past Surgical History  Procedure Date  . Tonsillectomy ~ 1970  . Coronary angioplasty with stent placement ~ 2003  . Refractive surgery 1990's    bilaterally    Family  History  Problem Relation Age of Onset  . Hypertension Mother   . Diabetes Mother   . Hypertension Father   . Diabetes Father   . Hyperlipidemia Father     History  Substance Use Topics  . Smoking status: Never Smoker   . Smokeless tobacco: Current User    Types: Chew     Comment: 02/16/2012 smoking cessation materials offered; pt declines  . Alcohol Use: Yes      Review of Systems A complete 10 system review of systems was obtained and all systems are negative except as noted in the HPI and PMH.   Allergies  Review of patient's allergies indicates no known allergies.  Home Medications   Current Outpatient Rx  Name  Route  Sig  Dispense  Refill  . ASPIRIN EC 81 MG PO TBEC   Oral   Take 81 mg by mouth daily.         Marland Kitchen LEVOFLOXACIN 750 MG PO TABS      For 8 days for your pneumonia.   8 tablet   0   . LISINOPRIL 5 MG PO TABS   Oral   Take 5 mg by mouth daily.         Marland Kitchen LISINOPRIL 5 MG PO TABS      TAKE ONE TABLET BY  MOUTH ONE TIME DAILY   90 tablet   0   . METFORMIN HCL ER 500 MG PO TB24      TAKE 2 TABLETS BY MOUTH WITH EVENING MEAL   60 tablet   0   . METOPROLOL TARTRATE 100 MG PO TABS   Oral   Take 100 mg by mouth 2 (two) times daily.         Marland Kitchen METOPROLOL TARTRATE 100 MG PO TABS      TAKE ONE TABLET BY MOUTH TWICE DAILY   60 tablet   1     Patient is overdue for appointment, no other refil ...   . PRAVASTATIN SODIUM 40 MG PO TABS   Oral   Take 40 mg by mouth daily.         Marland Kitchen PRAVASTATIN SODIUM 40 MG PO TABS      TAKE ONE TABLET BY MOUTH ONE TIME DAILY IN THE EVENING   90 tablet   0     Triage Vitals: BP 230/150  Pulse 117  Temp 97.8 F (36.6 C) (Oral)  Resp 20  Ht _0  (1.778 m)  Wt 224 lb (101.606 kg)  BMI 32.14 kg/m2  SpO2 100%  Physical Exam  Nursing note and vitals reviewed. Constitutional: He is oriented to person, place, and time. He appears well-developed and well-nourished.  HENT:  Head: Normocephalic and  atraumatic.  Eyes: EOM are normal. Pupils are equal, round, and reactive to light.  Neck: Normal range of motion. Neck supple.  Cardiovascular: Normal rate, normal heart sounds and intact distal pulses.   Pulmonary/Chest: Effort normal and breath sounds normal.  Abdominal: Bowel sounds are normal. He exhibits no distension. There is no tenderness.  Genitourinary:       Mild tenderness to left flank  Musculoskeletal: Normal range of motion. He exhibits no edema and no tenderness.  Neurological: He is alert and oriented to person, place, and time. He has normal strength. No cranial nerve deficit or sensory deficit.  Skin: Skin is warm and dry. No rash noted.  Psychiatric: He has a normal mood and affect.    ED Course  Procedures (including critical care time) CRITICAL CARE Performed by: Truddie Hidden.   Total critical care time: 50  Critical care time was exclusive of separately billable procedures and treating other patients.  Critical care was necessary to treat or prevent imminent or life-threatening deterioration.  Critical care was time spent personally by me on the following activities: development of treatment plan with patient and/or surrogate as well as nursing, discussions with consultants, evaluation of patient's response to treatment, examination of patient, obtaining history from patient or surrogate, ordering and performing treatments and interventions, ordering and review of laboratory studies, ordering and review of radiographic studies, pulse oximetry and re-evaluation of patient's condition.   DIAGNOSTIC STUDIES: Oxygen Saturation is 100% on room air, normal by my interpretation.    COORDINATION OF CARE: 10:25 PM Discussed treatment plan which includes pain medication, CT and urinalysis with pt at bedside and pt agreed to plan.    Labs Reviewed  URINALYSIS, ROUTINE W REFLEX MICROSCOPIC - Abnormal; Notable for the following:    Glucose, UA >1000 (*)     Hgb  urine dipstick LARGE (*)     Protein, ur 100 (*)     All other components within normal limits  CBC WITH DIFFERENTIAL - Abnormal; Notable for the following:    Neutrophils Relative 83 (*)     Neutro Abs 8.1 (*)  Lymphocytes Relative 10 (*)     All other components within normal limits  COMPREHENSIVE METABOLIC PANEL - Abnormal; Notable for the following:    Sodium 133 (*)     Glucose, Bld 394 (*)     GFR calc non Af Amer 69 (*)     GFR calc Af Amer 80 (*)     All other components within normal limits  PRO B NATRIURETIC PEPTIDE - Abnormal; Notable for the following:    Pro B Natriuretic peptide (BNP) 2947.0 (*)     All other components within normal limits  TROPONIN I  URINE MICROSCOPIC-ADD ON   Ct Abdomen Pelvis Wo Contrast  06/03/2012  *RADIOLOGY REPORT*  Clinical Data: Left flank pain and groin pain.  CT ABDOMEN AND PELVIS WITHOUT CONTRAST  Technique:  Multidetector CT imaging of the abdomen and pelvis was performed following the standard protocol without intravenous contrast.  Comparison: None.  Findings: Mild patchy bibasilar airspace opacities raise concern for pneumonia.  A trace right pleural effusion is noted.  A few small nodes are seen about the distal esophagus, nonspecific in appearance.  Slightly prominent nodes at the hepatic hilum are also nonspecific.  The liver and spleen are unremarkable in appearance.  The gallbladder is within normal limits.  The pancreas and adrenal glands are unremarkable.  There is mild left-sided hydronephrosis, with left-sided perinephric stranding and fluid, and prominence of the left ureter along its entire course, with an obstructing 6 mm stone noted distally just proximal to the left vesicoureteral junction.  Scattered nonobstructing stones are noted at the lower pole of the left kidney, measuring up to 7 mm in size.  The right kidney is unremarkable in appearance.  No free fluid is identified.  The small bowel is unremarkable in appearance.  The  stomach is within normal limits.  No acute vascular abnormalities are seen.  The appendix is normal in caliber, without evidence for appendicitis.  The colon is grossly unremarkable in appearance.  The bladder is mildly distended and otherwise unremarkable.  The prostate remains normal in size.  No inguinal lymphadenopathy is seen.  No acute osseous abnormalities are identified.  IMPRESSION:  1.  Mild left-sided hydronephrosis, with left-sided perinephric stranding and fluid, and an obstructing 6 mm stone noted distally, just proximal to the left vesicoureteral junction. 2.  Scattered nonobstructing left renal stones measure up to 7 mm in size. 3.  Mild patchy bibasilar airspace opacities, concerning for pneumonia.  Trace right pleural effusion seen. 4.  Few small nodes noted at the distal esophagus and at the hepatic hilum, nonspecific in appearance.  Hepatic hilum nodes measure up to 1.0 cm in short axis.   Original Report Authenticated By: Santa Lighter, M.D.    Dg Chest 2 View  06/03/2012  *RADIOLOGY REPORT*  Clinical Data: Shortness of breath.  Recent pneumonia.  CHEST - 2 VIEW  Comparison: 02/17/2012 and 10/08/2006  Findings: Lungs are adequately inflated dilatation or effusion. There is mild prominence of the perihilar markings suggesting mild vascular congestion.  There is borderline cardiomegaly unchanged. Remainder of the exam is unchanged.  IMPRESSION: Suggestion of mild vascular congestion and stable borderline cardiomegaly.   Original Report Authenticated By: Marin Olp, M.D.      No diagnosis found.    MDM   Date: 06/03/2012  Rate: 118  Rhythm: sinus tachycardia  QRS Axis: right  Intervals: normal  ST/T Wave abnormalities: normal  Conduction Disutrbances:none  Narrative Interpretation: inferior Q waves are unchanged  Old EKG  Reviewed: unchanged  Pt with symptoms of L sided renal colic. Also having SOB without cough in setting of marked HTN. He is known to be non-compliant with  meds and reports no HTN meds for at least the last several days. Will check for kidney stone as well as evidence of end organ damage from his HTN. Nursing reports he had a brief episode of hypoxia in the room, will add BNP to labs. Recheck BP after pain improved as well.   I personally performed the services described in this documentation, which was scribed in my presence. The recorded information has been reviewed and is accurate.   11:59 PM CT and CXR images reviewed with Radiologist. Symptoms and clinical presentation more consistent with acute CHF due to hypertensive emergency. He is still having hypoxia on room air but no fever, cough or leukocytosis. BP improved with pain control but still elevated. Will give labetalol and discuss admission with Hospitalist. CT also shows distal ureteral stone, pain controlled at this point. No need for immediate intervention.    12:28 AM Pt with worsening hypoxia when he falls asleep. Will start BiPAP for now, admit to [facility].      Charles B. Karle Starch, MD 06/04/12 [phone]

## 2012-06-04 ENCOUNTER — Inpatient Hospital Stay (HOSPITAL_COMMUNITY): Payer: Self-pay

## 2012-06-04 ENCOUNTER — Encounter (HOSPITAL_COMMUNITY): Payer: Self-pay | Admitting: *Deleted

## 2012-06-04 DIAGNOSIS — I10 Essential (primary) hypertension: Secondary | ICD-10-CM

## 2012-06-04 DIAGNOSIS — I2510 Atherosclerotic heart disease of native coronary artery without angina pectoris: Secondary | ICD-10-CM

## 2012-06-04 DIAGNOSIS — I509 Heart failure, unspecified: Secondary | ICD-10-CM

## 2012-06-04 DIAGNOSIS — I161 Hypertensive emergency: Secondary | ICD-10-CM | POA: Diagnosis present

## 2012-06-04 DIAGNOSIS — E119 Type 2 diabetes mellitus without complications: Secondary | ICD-10-CM

## 2012-06-04 DIAGNOSIS — R0902 Hypoxemia: Secondary | ICD-10-CM | POA: Diagnosis present

## 2012-06-04 DIAGNOSIS — N200 Calculus of kidney: Secondary | ICD-10-CM

## 2012-06-04 LAB — CBC
HCT: 42.9 % (ref 39.0–52.0)
MCHC: 34 g/dL (ref 30.0–36.0)
MCV: 97.5 fL (ref 78.0–100.0)
Platelets: 185 10*3/uL (ref 150–400)
RDW: 15.2 % (ref 11.5–15.5)
WBC: 7.9 10*3/uL (ref 4.0–10.5)

## 2012-06-04 LAB — GLUCOSE, CAPILLARY
Glucose-Capillary: 189 mg/dL — ABNORMAL HIGH (ref 70–99)
Glucose-Capillary: 178 mg/dL — ABNORMAL HIGH (ref 70–99)

## 2012-06-04 LAB — MRSA PCR SCREENING: MRSA by PCR: NEGATIVE

## 2012-06-04 LAB — BASIC METABOLIC PANEL
BUN: 22 mg/dL (ref 6–23)
Chloride: 99 mEq/L (ref 96–112)
Creatinine, Ser: 1.26 mg/dL (ref 0.50–1.35)
GFR calc Af Amer: 75 mL/min — ABNORMAL LOW (ref >90)
GFR calc non Af Amer: 65 mL/min — ABNORMAL LOW (ref >90)

## 2012-06-04 LAB — LIPID PANEL
Cholesterol: 173 mg/dL (ref 0–200)
Triglycerides: 110 mg/dL (ref <150)

## 2012-06-04 LAB — PRO B NATRIURETIC PEPTIDE: Pro B Natriuretic peptide (BNP): 2992 pg/mL — ABNORMAL HIGH (ref 0–125)

## 2012-06-04 MED ORDER — INSULIN ASPART 100 UNIT/ML ~~LOC~~ SOLN
4.0000 [IU] | Freq: Three times a day (TID) | SUBCUTANEOUS | Status: DC
  Administered 2012-06-05 – 2012-06-08 (×9): 4 [IU] via SUBCUTANEOUS

## 2012-06-04 MED ORDER — METFORMIN HCL ER 500 MG PO TB24
500.0000 mg | ORAL_TABLET | Freq: Two times a day (BID) | ORAL | Status: DC
  Administered 2012-06-04 (×2): 500 mg via ORAL
  Filled 2012-06-04 (×5): qty 1

## 2012-06-04 MED ORDER — ACETAMINOPHEN 650 MG RE SUPP: 650.0000 mg | Freq: Four times a day (QID) | RECTAL | Status: DC | PRN

## 2012-06-04 MED ORDER — INSULIN GLARGINE 100 UNIT/ML ~~LOC~~ SOLN
5.0000 [IU] | Freq: Once | SUBCUTANEOUS | Status: AC
  Administered 2012-06-04: 5 [IU] via SUBCUTANEOUS

## 2012-06-04 MED ORDER — SIMVASTATIN 40 MG PO TABS
40.0000 mg | ORAL_TABLET | Freq: Every day | ORAL | Status: DC
  Administered 2012-06-04 – 2012-06-07 (×4): 40 mg via ORAL
  Filled 2012-06-04 (×5): qty 1

## 2012-06-04 MED ORDER — SENNOSIDES-DOCUSATE SODIUM 8.6-50 MG PO TABS
1.0000 | ORAL_TABLET | Freq: Every evening | ORAL | Status: DC | PRN
  Filled 2012-06-04: qty 1

## 2012-06-04 MED ORDER — ENOXAPARIN SODIUM 40 MG/0.4ML ~~LOC~~ SOLN
40.0000 mg | SUBCUTANEOUS | Status: DC
  Administered 2012-06-04 – 2012-06-05 (×2): 40 mg via SUBCUTANEOUS
  Filled 2012-06-04 (×2): qty 0.4

## 2012-06-04 MED ORDER — ALUM & MAG HYDROXIDE-SIMETH 200-200-20 MG/5ML PO SUSP: 30.0000 mL | Freq: Four times a day (QID) | ORAL | Status: DC | PRN

## 2012-06-04 MED ORDER — NITROGLYCERIN 0.4 MG SL SUBL: 0.4000 mg | SUBLINGUAL_TABLET | SUBLINGUAL | Status: DC | PRN

## 2012-06-04 MED ORDER — METOPROLOL TARTRATE 50 MG PO TABS
50.0000 mg | ORAL_TABLET | Freq: Two times a day (BID) | ORAL | Status: DC
  Administered 2012-06-05 – 2012-06-08 (×7): 50 mg via ORAL
  Filled 2012-06-04 (×9): qty 1

## 2012-06-04 MED ORDER — INSULIN ASPART 100 UNIT/ML ~~LOC~~ SOLN: 0.0000 [IU] | Freq: Every day | SUBCUTANEOUS | Status: DC

## 2012-06-04 MED ORDER — INFLUENZA VIRUS VACC SPLIT PF IM SUSP
0.5000 mL | INTRAMUSCULAR | Status: AC
  Administered 2012-06-05: 0.5 mL via INTRAMUSCULAR
  Filled 2012-06-04: qty 0.5

## 2012-06-04 MED ORDER — SODIUM CHLORIDE 0.9 % IJ SOLN
3.0000 mL | INTRAMUSCULAR | Status: DC | PRN
  Administered 2012-06-06: 3 mL via INTRAVENOUS

## 2012-06-04 MED ORDER — ONDANSETRON HCL 4 MG/2ML IJ SOLN: 4.0000 mg | Freq: Four times a day (QID) | INTRAMUSCULAR | Status: DC | PRN

## 2012-06-04 MED ORDER — METOPROLOL TARTRATE 100 MG PO TABS
100.0000 mg | ORAL_TABLET | Freq: Two times a day (BID) | ORAL | Status: DC
Start: 2012-06-04 — End: 2012-06-04
  Administered 2012-06-04: 100 mg via ORAL
  Filled 2012-06-04 (×2): qty 1

## 2012-06-04 MED ORDER — FUROSEMIDE 10 MG/ML IJ SOLN
20.0000 mg | Freq: Two times a day (BID) | INTRAMUSCULAR | Status: DC
  Administered 2012-06-04 – 2012-06-05 (×2): 20 mg via INTRAVENOUS
  Filled 2012-06-04 (×2): qty 2

## 2012-06-04 MED ORDER — ONDANSETRON HCL 4 MG PO TABS
4.0000 mg | ORAL_TABLET | Freq: Four times a day (QID) | ORAL | Status: DC | PRN
  Filled 2012-06-04: qty 1

## 2012-06-04 MED ORDER — LISINOPRIL 5 MG PO TABS
5.0000 mg | ORAL_TABLET | Freq: Every day | ORAL | Status: DC
  Administered 2012-06-04: 5 mg via ORAL
  Filled 2012-06-04 (×2): qty 1

## 2012-06-04 MED ORDER — HYDROMORPHONE HCL PF 1 MG/ML IJ SOLN
1.0000 mg | INTRAMUSCULAR | Status: AC | PRN
  Administered 2012-06-04: 1 mg via INTRAVENOUS
  Filled 2012-06-04: qty 1

## 2012-06-04 MED ORDER — SODIUM CHLORIDE 0.9 % IJ SOLN
3.0000 mL | Freq: Two times a day (BID) | INTRAMUSCULAR | Status: DC
  Administered 2012-06-04 – 2012-06-05 (×5): 3 mL via INTRAVENOUS

## 2012-06-04 MED ORDER — INSULIN GLARGINE 100 UNIT/ML ~~LOC~~ SOLN
10.0000 [IU] | Freq: Every day | SUBCUTANEOUS | Status: DC
  Administered 2012-06-04 – 2012-06-07 (×4): 10 [IU] via SUBCUTANEOUS

## 2012-06-04 MED ORDER — LABETALOL HCL 5 MG/ML IV SOLN
5.0000 mg | INTRAVENOUS | Status: DC | PRN
  Filled 2012-06-04: qty 4

## 2012-06-04 MED ORDER — TAMSULOSIN HCL 0.4 MG PO CAPS
0.4000 mg | ORAL_CAPSULE | Freq: Every day | ORAL | Status: DC
  Administered 2012-06-04 – 2012-06-05 (×2): 0.4 mg via ORAL
  Filled 2012-06-04 (×3): qty 1

## 2012-06-04 MED ORDER — ASPIRIN 325 MG PO TABS
325.0000 mg | ORAL_TABLET | Freq: Every day | ORAL | Status: DC
  Administered 2012-06-04 – 2012-06-08 (×5): 325 mg via ORAL
  Filled 2012-06-04 (×5): qty 1

## 2012-06-04 MED ORDER — ACETAMINOPHEN 325 MG PO TABS
650.0000 mg | ORAL_TABLET | Freq: Four times a day (QID) | ORAL | Status: DC | PRN
  Administered 2012-06-06: 650 mg via ORAL
  Filled 2012-06-04: qty 2

## 2012-06-04 MED ORDER — FUROSEMIDE 10 MG/ML IJ SOLN
40.0000 mg | Freq: Two times a day (BID) | INTRAMUSCULAR | Status: DC
  Administered 2012-06-04: 40 mg via INTRAVENOUS
  Filled 2012-06-04 (×3): qty 4

## 2012-06-04 MED ORDER — INSULIN ASPART 100 UNIT/ML ~~LOC~~ SOLN
0.0000 [IU] | Freq: Three times a day (TID) | SUBCUTANEOUS | Status: DC
  Administered 2012-06-04: 5 [IU] via SUBCUTANEOUS
  Administered 2012-06-04: 3 [IU] via SUBCUTANEOUS
  Administered 2012-06-04: 11 [IU] via SUBCUTANEOUS
  Administered 2012-06-05: 2 [IU] via SUBCUTANEOUS
  Administered 2012-06-05: 5 [IU] via SUBCUTANEOUS
  Administered 2012-06-05: 2 [IU] via SUBCUTANEOUS
  Administered 2012-06-06: 3 [IU] via SUBCUTANEOUS
  Administered 2012-06-06: 2 [IU] via SUBCUTANEOUS
  Administered 2012-06-06: 3 [IU] via SUBCUTANEOUS
  Administered 2012-06-07: 8 [IU] via SUBCUTANEOUS
  Administered 2012-06-07: 3 [IU] via SUBCUTANEOUS

## 2012-06-04 MED ORDER — HYDROCODONE-ACETAMINOPHEN 5-325 MG PO TABS
1.0000 | ORAL_TABLET | ORAL | Status: DC | PRN
  Administered 2012-06-05: 2 via ORAL
  Administered 2012-06-05 – 2012-06-06 (×5): 1 via ORAL
  Administered 2012-06-07: 2 via ORAL
  Administered 2012-06-07: 1 via ORAL
  Filled 2012-06-04: qty 2
  Filled 2012-06-04: qty 1
  Filled 2012-06-04 (×2): qty 2
  Filled 2012-06-04 (×4): qty 1

## 2012-06-04 NOTE — Procedures (Signed)
Pt uses nasal cpap at home. Pt refuses full face mask. Pt placed on 35% venti mask pt maintains saturations . Pt given narcotics earlier. Pt to be transported to [facility] Respiratory notified to provide cpap.

## 2012-06-04 NOTE — Progress Notes (Signed)
Family Medicine Teaching Service Progress Note  I came by to see the patient in order to transfer care to FMTS. The patient informed me that he has been out of medicine for 6 months. However, he plans to follow up with Dr. Garnet Koyanagi. He reports that he is a nice lady who he can make an appt to see for f/u. He does not attend to f/u at [place] [facility]. I called Dr. Reece Levy (transferring physician) to inform him of the change. The patient will remain on the hospitalist service.   Vincent Chandler 11:05 AM, 06/04/2012

## 2012-06-04 NOTE — ED Notes (Signed)
Report called to [facility]

## 2012-06-04 NOTE — Progress Notes (Signed)
Inpatient Diabetes Program Recommendations  AACE/ADA: New Consensus Statement on Inpatient Glycemic Control (2013)  Target Ranges:  Prepandial:   less than 140 mg/dL      Peak postprandial:   less than 180 mg/dL (1-2 hours)      Critically ill patients:  140 - 180 mg/dL   Reason for Visit: Results for Vincent Chandler, Vincent Chandler (MRN [id number]) as of 06/04/2012 14:46  Ref. Range 06/04/2012 07:59 06/04/2012 08:22 06/04/2012 12:17  Glucose-Capillary Latest Range: 70-99 mg/dL 241 (H)  316 (H)   Please check A1C to determine pre-hospitalization glycemic control. Agree with addition of Lantus. Please titrate Lantus based on fasting CBG's.  Also please add Novolog meal coverage 4 units tid with meals (hold if patient eats less than 50%).  Will follow.

## 2012-06-04 NOTE — Progress Notes (Signed)
Pt transferred to [id number]. Pt VSS.

## 2012-06-04 NOTE — Progress Notes (Addendum)
Patient c/o "feeling funny", c/o sudden "hot", BP is decreased to around systolic 725 after antihypertensives at 1000, temp taken and is normal at 98.0 orally, given cool cloth and placed on 2 LPM nasal cannula for oxygen sat less than 90, Hazle Nordmann RN  Dr. Reece Levy notified of patients complaints, CBG 316

## 2012-06-04 NOTE — Progress Notes (Signed)
Pt transfered to floor and settled into room. Pt oriented to floor and room. Pt A/O and VSS. Will continue to monitor and assess. Elyn Aquas A RN 06/04/2012

## 2012-06-04 NOTE — Progress Notes (Addendum)
Subjective: Breathing better. Had flank pain this morning. No specific concerns.  Objective: Vital signs in last 24 hours: Filed Vitals:   06/04/12 0300 06/04/12 0700 06/04/12 0755 06/04/12 0832  BP: 130/96 144/106  133/92  Pulse: 81  93   Temp:   97.8 F (36.6 C)   TempSrc:   Oral   Resp: 13     Height:      Weight:      SpO2:   91% 92%   Weight change:   Intake/Output Summary (Last 24 hours) at 06/04/12 0901 Last data filed at 06/04/12 0800  Gross per 24 hour  Intake      0 ml  Output    550 ml  Net   -550 ml    Physical Exam: General: Awake, Oriented, No acute distress. HEENT: EOMI. Neck: Supple CV: S1 and S2 Lungs: Good air movement, Crackles at bases bilaterally. Abdomen: Soft, Nontender, Nondistended, +bowel sounds. Ext: Good pulses. 1-2 + LE edema.   Lab Results: Basic Metabolic Panel:  Lab 94/85/46 0304 06/03/12 2207  NA 133* 133*  K 4.9 4.4  CL 99 100  CO2 22 20  GLUCOSE 332* 394*  BUN 22 20  CREATININE 1.26 1.20  CALCIUM 9.1 9.4  MG -- --  PHOS -- --   Liver Function Tests:  Lab 06/03/12 2207  AST 22  ALT 26  ALKPHOS 82  BILITOT 0.7  PROT 7.6  ALBUMIN 3.8   No results found for this basename: LIPASE:5,AMYLASE:5 in the last 168 hours No results found for this basename: AMMONIA:5 in the last 168 hours CBC:  Lab 06/04/12 0304 06/03/12 2207  WBC 7.9 9.7  NEUTROABS -- 8.1*  HGB 14.6 15.7  HCT 42.9 44.8  MCV 97.5 93.9  PLT 185 216   Cardiac Enzymes:  Lab 06/04/12 0304 06/03/12 2207  CKTOTAL -- --  CKMB -- --  CKMBINDEX -- --  TROPONINI <0.30 <0.30   BNP (last 3 results)  Basename 06/04/12 0304 06/03/12 2213  PROBNP 2992.0* 2947.0*   CBG:  Lab 06/04/12 0759  GLUCAP 241*   No results found for this basename: HGBA1C:5 in the last 72 hours Other Labs: No components found with this basename: POCBNP:3 No results found for this basename: DDIMER:2 in the last 168 hours  Lab 06/04/12 0309  CHOL 173  HDL 32*  LDLCALC 119*   TRIG 110  CHOLHDL 5.4  LDLDIRECT --   No results found for this basename: TSH,T4TOTAL,FREET3,T3FREE,FREET4,THYROIDAB in the last 168 hours No results found for this basename: VITAMINB12:2,FOLATE:2,FERRITIN:2,TIBC:2,IRON:2,RETICCTPCT:2 in the last 168 hours  Micro Results: Recent Results (from the past 240 hour(s))  MRSA PCR SCREENING     Status: Normal   Collection Time   06/04/12  2:08 AM      Component Value Range Status Comment   MRSA by PCR NEGATIVE  NEGATIVE Final     Studies/Results: Ct Abdomen Pelvis Wo Contrast  06/03/2012  *RADIOLOGY REPORT*  Clinical Data: Left flank pain and groin pain.  CT ABDOMEN AND PELVIS WITHOUT CONTRAST  Technique:  Multidetector CT imaging of the abdomen and pelvis was performed following the standard protocol without intravenous contrast.  Comparison: None.  Findings: Mild patchy bibasilar airspace opacities raise concern for pneumonia.  A trace right pleural effusion is noted.  A few small nodes are seen about the distal esophagus, nonspecific in appearance.  Slightly prominent nodes at the hepatic hilum are also nonspecific.  The liver and spleen are unremarkable in appearance.  The  gallbladder is within normal limits.  The pancreas and adrenal glands are unremarkable.  There is mild left-sided hydronephrosis, with left-sided perinephric stranding and fluid, and prominence of the left ureter along its entire course, with an obstructing 6 mm stone noted distally just proximal to the left vesicoureteral junction.  Scattered nonobstructing stones are noted at the lower pole of the left kidney, measuring up to 7 mm in size.  The right kidney is unremarkable in appearance.  No free fluid is identified.  The small bowel is unremarkable in appearance.  The stomach is within normal limits.  No acute vascular abnormalities are seen.  The appendix is normal in caliber, without evidence for appendicitis.  The colon is grossly unremarkable in appearance.  The bladder is  mildly distended and otherwise unremarkable.  The prostate remains normal in size.  No inguinal lymphadenopathy is seen.  No acute osseous abnormalities are identified.  IMPRESSION:  1.  Mild left-sided hydronephrosis, with left-sided perinephric stranding and fluid, and an obstructing 6 mm stone noted distally, just proximal to the left vesicoureteral junction. 2.  Scattered nonobstructing left renal stones measure up to 7 mm in size. 3.  Mild patchy bibasilar airspace opacities, concerning for pneumonia.  Trace right pleural effusion seen. 4.  Few small nodes noted at the distal esophagus and at the hepatic hilum, nonspecific in appearance.  Hepatic hilum nodes measure up to 1.0 cm in short axis.   Original Report Authenticated By: Santa Lighter, M.D.    Dg Chest 2 View  06/03/2012  *RADIOLOGY REPORT*  Clinical Data: Shortness of breath.  Recent pneumonia.  CHEST - 2 VIEW  Comparison: 02/17/2012 and 10/08/2006  Findings: Lungs are adequately inflated dilatation or effusion. There is mild prominence of the perihilar markings suggesting mild vascular congestion.  There is borderline cardiomegaly unchanged. Remainder of the exam is unchanged.  IMPRESSION: Suggestion of mild vascular congestion and stable borderline cardiomegaly.   Original Report Authenticated By: Marin Olp, M.D.    Dg Chest Port 1 View  06/04/2012  *RADIOLOGY REPORT*  Clinical Data: CHF, shortness of breath.  PORTABLE CHEST - 1 VIEW  Comparison: 06/03/2012  Findings: Cardiomegaly with vascular congestion.  Mild interstitial prominence could reflect early interstitial edema, slightly increased since prior study.  Minimal bibasilar atelectasis.  No visible effusions.  IMPRESSION: Cardiomegaly with vascular congestion.  Question early interstitial edema.   Original Report Authenticated By: Rolm Baptise, M.D.     Medications: I have reviewed the patient's current medications. Scheduled Meds:   . aspirin  325 mg Oral Daily  . enoxaparin  (LOVENOX) injection  40 mg Subcutaneous Q24H  . furosemide  40 mg Intravenous Q12H  . [COMPLETED]  HYDROmorphone (DILAUDID) injection  1 mg Intravenous Once  . [COMPLETED]  HYDROmorphone (DILAUDID) injection  1 mg Intravenous Once  . influenza  inactive virus vaccine  0.5 mL Intramuscular Tomorrow-1000  . insulin aspart  0-15 Units Subcutaneous TID WC  . insulin aspart  0-5 Units Subcutaneous QHS  . [COMPLETED] labetalol  20 mg Intravenous Once  . lisinopril  5 mg Oral Daily  . metFORMIN  500 mg Oral BID WC  . metoprolol  100 mg Oral BID  . [COMPLETED] ondansetron  4 mg Intravenous Once  . simvastatin  40 mg Oral q1800  . [COMPLETED] sodium chloride  1,000 mL Intravenous Once  . sodium chloride  3 mL Intravenous Q12H  . [DISCONTINUED] sodium chloride   Intravenous Once   Continuous Infusions:  PRN Meds:.acetaminophen, acetaminophen, alum &  mag hydroxide-simeth, HYDROcodone-acetaminophen, HYDROmorphone (DILAUDID) injection, labetalol, nitroGLYCERIN, ondansetron (ZOFRAN) IV, ondansetron, senna-docusate, sodium chloride  Assessment/Plan: Acute respiratory failure/shortness of breath Due to uncontrolled hypertension, medical noncompliance.  Improved this morning with diuresis and BiPAP.  Currently off of BiPAP.  Acute on chronic systolic congestive heart failure/ischemic cardiomyopathy with EF of 25-30% with diffuse hypokinesis this in a 2-D echocardiogram on 11/17/2010/coronary artery disease status post RCA stenting in the past Improved with diuresis with IV Lasix.  Continue IV Lasix.  Continue lisinopril.  Will likely need to followup with cardiology to reestablish care with them after discharge.  Malignant hypertension/Uncontrolled hypertension Improved with metoprolol and lisinopril.  Continue.  Left nephrolithiasis 6 mm with mild left-sided hydronephrosis with left sided perinephric stranding and fluid with scattered nonobstructing left renal stones measuring 7 mm Pain control.  Discussed with Dr. Jocelyn Lamer, urology, recommended patient have followup with urology in one week after discharge.  Patient does not need inpatient evaluation.  Hyperlipidemia Continue simvastatin for now.  Resume pravastatin at discharge.  Type 2 diabetes uncontrolled with complications Last hemoglobin A1c in August of 2013 was 9.9.  Patient only on metformin.  Will start Lantus.  Sliding scale insulin.  Titrate insulin as tolerated suspect metformin alone will not be effective in controlling his diabetes.  Medical noncompliance/PCP Patient indicated that he wants to followup with Dr. Etter Sjogren.  In the past has seen [facility]. Will need scripts for medications at discharge as he has run out of his medications.  Mild patchy bibasilar airspace opacities on CT Not suspecting pneumonia as patient does not have cough, leukocytosis or fever.  Suspect maybe likely related to mild volume overload from congestive heart failure.  Prophylaxis Lovenox.  CODE STATUS Full code.  Disposition Transfer to telemetry if stable.   LOS: 1 day  Jaslyn Bansal A, MD 06/04/2012, 9:01 AM

## 2012-06-04 NOTE — H&P (Signed)
PCP:   Garnet Koyanagi, DO  Cardiology: [place]  Chief Complaint:  Flank pain  HPI: This is a 51 year old gentleman who states that yesterday he developed sudden left flank pain at approximately 5:30 PM. He had nausea and vomiting. He had no evidence of blood in the urine. He eventually came to ER. Patient reports he's had an episode of nephrolithiasis once in the distant past. Additionally, the patient complains of worsening shortness of breath over the past 3 days. He states he also has chest pressure centrally located whenever he climbs stairs. He denies any palpitation, diaphoresis, lower extremity edema. The patient denies any cough, wheezing, chills. The patient does have a history of cardiomyopathy with a EF of 25%. He states his last stress test was approximately 2 years ago, his last heart cath was over 10 years ago. The patient states he's not been taking his medication for approximately 2 weeks as he ran out. In the ER the patient's systolic blood pressure was greater than 200. [facility] was seen [facility], a transfer was requested for a  diagnosis of congestive heart failure and nephrolithiasis. Per ER physician [facility] the patient's oxygen saturation was declining, he initiatesd BiPAP. The patient does have nephrolithiasis, however, he was easily made pain free. During my interview patient was comfortable. History provided by the patient who is alert and oriented.  Review of Systems:  The patient denies anorexia, fever, weight loss,, vision loss, decreased hearing, hoarseness, syncope, peripheral edema, balance deficits, hemoptysis, melena, hematochezia, severe indigestion/heartburn, hematuria, incontinence, genital sores, muscle weakness, suspicious skin lesions, transient blindness, difficulty walking, depression, unusual weight change, abnormal bleeding, enlarged lymph nodes, angioedema, and breast masses.  Past Medical History: Past Medical History  Diagnosis Date   . Coronary artery disease     drug eluting sten RCA 2005-EF 30% at that time  . Cardiomyopathy     alcohol use related  . Asthma   . Hypertension   . Urinary incontinence   . Hyperlipidemia   . History of hypogonadism   . Pneumonia 02/16/2012    "today makes third time in my life"  . OSA on CPAP 02/16/2012    "wear mask sometimes"  . Diabetes mellitus type II   . H/O hiatal hernia   . GERD (gastroesophageal reflux disease)   . Migraines 02/16/2012    "used to"  . Anxiety    Past Surgical History  Procedure Date  . Tonsillectomy ~ 1970  . Coronary angioplasty with stent placement ~ 2003  . Refractive surgery 1990's    bilaterally    Medications: Prior to Admission medications   Medication Sig Start Date End Date Taking? Authorizing Provider  aspirin EC 81 MG tablet Take 81 mg by mouth daily.    Historical Provider, MD  levofloxacin (LEVAQUIN) 750 MG tablet For 8 days for your pneumonia. 02/17/12   Carolin Guernsey, MD  lisinopril (PRINIVIL,ZESTRIL) 5 MG tablet Take 5 mg by mouth daily.    Historical Provider, MD  lisinopril (PRINIVIL,ZESTRIL) 5 MG tablet TAKE ONE TABLET BY MOUTH ONE TIME DAILY 02/18/12   Rosalita Chessman, DO  metFORMIN (GLUCOPHAGE-XR) 500 MG 24 hr tablet TAKE 2 TABLETS BY MOUTH WITH EVENING MEAL 02/18/12   Rosalita Chessman, DO  metoprolol (LOPRESSOR) 100 MG tablet Take 100 mg by mouth 2 (two) times daily.    Historical Provider, MD  metoprolol (LOPRESSOR) 100 MG tablet TAKE ONE TABLET BY MOUTH TWICE DAILY 02/18/12   Remo Lipps  Peterson Lombard, MD  pravastatin (PRAVACHOL) 40 MG tablet Take 40 mg by mouth daily.    Historical Provider, MD  pravastatin (PRAVACHOL) 40 MG tablet TAKE ONE TABLET BY MOUTH ONE TIME DAILY IN THE EVENING 02/18/12   Rosalita Chessman, DO    Allergies:  No Known Allergies  Social History:  reports that he has never smoked. His smokeless tobacco use includes Chew. He reports that he drinks alcohol. He reports that he does not use illicit drugs.  Family  History: Family History  Problem Relation Age of Onset  . Hypertension Mother   . Diabetes Mother   . Hypertension Father   . Diabetes Father   . Hyperlipidemia Father     Physical Exam: Filed Vitals:   06/03/12 2330 06/03/12 2352 06/04/12 0002 06/04/12 0205  BP:  179/117 184/118 128/115  Pulse: 112 113  87  Temp:  98 F (36.7 C)  97.9 F (36.6 C)  TempSrc:  Oral  Oral  Resp: 22   13  Height:    _0  (1.778 m)  Weight:    104.1 kg (229 lb 8 oz)  SpO2: 99% 86%  98%    General:  Alert and oriented times three, well developed and nourished, no acute distress Eyes: PERRLA, pink conjunctiva, no scleral icterus ENT: Moist oral mucosa, neck supple, no thyromegaly Lungs: clear to ascultation, no wheeze, no crackles, no use of accessory muscles Cardiovascular: regular rate and rhythm, no regurgitation, no gallops, no murmurs. No carotid bruits, no JVD Abdomen: soft, positive BS, non-tender, non-distended, no organomegaly, not an acute abdomen GU: not examined Neuro: CN II - XII grossly intact, sensation intact Musculoskeletal: strength 5/5 all extremities, no clubbing, cyanosis or edema Skin: no rash, no subcutaneous crepitation, no decubitus Psych: appropriate patient   Labs on Admission:   [facility] 06/03/12 2207  NA 133*  K 4.4  CL 100  CO2 20  GLUCOSE 394*  BUN 20  CREATININE 1.20  CALCIUM 9.4  MG --  PHOS --    Basename 06/03/12 2207  AST 22  ALT 26  ALKPHOS 82  BILITOT 0.7  PROT 7.6  ALBUMIN 3.8   No results found for this basename: LIPASE:2,AMYLASE:2 in the last 72 hours  Basename 06/03/12 2207  WBC 9.7  NEUTROABS 8.1*  HGB 15.7  HCT 44.8  MCV 93.9  PLT 216    Basename 06/03/12 2207  CKTOTAL --  CKMB --  CKMBINDEX --  TROPONINI <0.30   Results for JAHAN, FRIEDLANDER (MRN [id number]) as of 06/04/2012 02:51  Ref. Range 06/03/2012 22:13  Pro B Natriuretic peptide (BNP) Latest Range: 0-125 pg/mL 2947.0 (H)    Micro Results: Results for  ALDRICH, LLOYD (MRN [id number]) as of 06/04/2012 02:51  Ref. Range 06/03/2012 23:05  Color, Urine Latest Range: YELLOW  YELLOW  APPearance Latest Range: CLEAR  CLEAR  Specific Gravity, Urine Latest Range: 1.005-1.030  1.026  pH Latest Range: 5.0-8.0  5.5  Glucose Latest Range: NEGATIVE mg/dL >1000 (A)  Bilirubin Urine Latest Range: NEGATIVE  NEGATIVE  Ketones, ur Latest Range: NEGATIVE mg/dL NEGATIVE  Protein Latest Range: NEGATIVE mg/dL 100 (A)  Urobilinogen, UA Latest Range: 0.0-1.0 mg/dL 1.0  Nitrite Latest Range: NEGATIVE  NEGATIVE  Leukocytes, UA Latest Range: NEGATIVE  NEGATIVE  Hgb urine dipstick Latest Range: NEGATIVE  LARGE (A)  WBC, UA Latest Range: <3 WBC/hpf 0-2  RBC / HPF Latest Range: <3 RBC/hpf 7-10  Squamous Epithelial / LPF Latest Range: RARE  RARE  Radiological Exams on Admission: Ct Abdomen Pelvis Wo Contrast  06/03/2012  *RADIOLOGY REPORT*  Clinical Data: Left flank pain and groin pain.  CT ABDOMEN AND PELVIS WITHOUT CONTRAST  Technique:  Multidetector CT imaging of the abdomen and pelvis was performed following the standard protocol without intravenous contrast.  Comparison: None.  Findings: Mild patchy bibasilar airspace opacities raise concern for pneumonia.  A trace right pleural effusion is noted.  A few small nodes are seen about the distal esophagus, nonspecific in appearance.  Slightly prominent nodes at the hepatic hilum are also nonspecific.  The liver and spleen are unremarkable in appearance.  The gallbladder is within normal limits.  The pancreas and adrenal glands are unremarkable.  There is mild left-sided hydronephrosis, with left-sided perinephric stranding and fluid, and prominence of the left ureter along its entire course, with an obstructing 6 mm stone noted distally just proximal to the left vesicoureteral junction.  Scattered nonobstructing stones are noted at the lower pole of the left kidney, measuring up to 7 mm in size.  The right kidney is  unremarkable in appearance.  No free fluid is identified.  The small bowel is unremarkable in appearance.  The stomach is within normal limits.  No acute vascular abnormalities are seen.  The appendix is normal in caliber, without evidence for appendicitis.  The colon is grossly unremarkable in appearance.  The bladder is mildly distended and otherwise unremarkable.  The prostate remains normal in size.  No inguinal lymphadenopathy is seen.  No acute osseous abnormalities are identified.  IMPRESSION:  1.  Mild left-sided hydronephrosis, with left-sided perinephric stranding and fluid, and an obstructing 6 mm stone noted distally, just proximal to the left vesicoureteral junction. 2.  Scattered nonobstructing left renal stones measure up to 7 mm in size. 3.  Mild patchy bibasilar airspace opacities, concerning for pneumonia.  Trace right pleural effusion seen. 4.  Few small nodes noted at the distal esophagus and at the hepatic hilum, nonspecific in appearance.  Hepatic hilum nodes measure up to 1.0 cm in short axis.   Original Report Authenticated By: Santa Lighter, M.D.    Dg Chest 2 View  06/03/2012  *RADIOLOGY REPORT*  Clinical Data: Shortness of breath.  Recent pneumonia.  CHEST - 2 VIEW  Comparison: 02/17/2012 and 10/08/2006  Findings: Lungs are adequately inflated dilatation or effusion. There is mild prominence of the perihilar markings suggesting mild vascular congestion.  There is borderline cardiomegaly unchanged. Remainder of the exam is unchanged.  IMPRESSION: Suggestion of mild vascular congestion and stable borderline cardiomegaly.   Original Report Authenticated By: Marin Olp, M.D.    EKG: Normal sinus rhythm  Assessment/Plan Present on Admission:  Chest pains  congestive heart failure  Coronary artery disease prior RCA stent with new inferior Q waves . Ischemic and nonischemic cardiomyopathy EF 25% 2012 Admit to step down, BiPAP ordered IV Lasix ordered, repeat chest x-ray and BNP in  a.m. Cycle cardiac enzymes and aspirin daily. Nitroglycerin when necessary CHF and chest pain likely precipitated by uncontrolled hypertension and medical noncompliance Malignant hypertension Medical noncompliance  Patient's home medications resumed. When necessary blood pressure medications ordered  Patient appears to have poor insight into his disease process  Nephrolithiasis Mild hydronephrosis Patient currently pain-free The main need urology consult if pain persists and so does not spontaneously pass  . DIABETES MELLITUS, TYPE II . HYPERLIPIDEMIA . HYPERTENSION  home medications resumed ADA diet and sliding scale insulin  Full code DVT prophylaxis  Alixandrea Milleson 06/04/2012, 2:52  AM  

## 2012-06-04 NOTE — Progress Notes (Signed)
Pt BP 85/62 automatic, 100/60 manual. MD notified and evening dose of lopressor held. Will continue to monitor and assess. Elyn Aquas A RN, 06/04/2012

## 2012-06-04 NOTE — Care Management Note (Signed)
    Page 1 of 1   06/04/2012     8:56:57 AM   CARE MANAGEMENT NOTE 06/04/2012  Patient:  Vincent Chandler, Vincent Chandler   Account Number:  [id number]  Date Initiated:  06/04/2012  Documentation initiated by:  Elissa Hefty  Subjective/Objective Assessment:   adm w htn urgency     Action/Plan:   lives alone, pcp dr Kendrick Fries [place]   Anticipated DC Date:     Anticipated DC Plan:        DC Planning Services  CM consult      Choice offered to / List presented to:             Status of service:   Medicare Important Message given?   (If response is "NO", the following Medicare IM given date fields will be blank) Date Medicare IM given:   Date Additional Medicare IM given:    Discharge Disposition:    Per UR Regulation:  Reviewed for med. necessity/level of care/duration of stay  If discussed at [place] of Stay Meetings, dates discussed:    Comments:  12/3 8:55a debbie Jeriel Vivanco rn,bsn [phone]

## 2012-06-04 NOTE — Progress Notes (Signed)
Placed pt. On CPAP of 12 cm H2O via nasal mask with 4L O2 bled in. Pt. Is tolerating CPAP well at this time.

## 2012-06-05 DIAGNOSIS — N179 Acute kidney failure, unspecified: Secondary | ICD-10-CM | POA: Diagnosis present

## 2012-06-05 LAB — GLUCOSE, CAPILLARY
Glucose-Capillary: 140 mg/dL — ABNORMAL HIGH (ref 70–99)
Glucose-Capillary: 213 mg/dL — ABNORMAL HIGH (ref 70–99)

## 2012-06-05 LAB — HEMOGLOBIN A1C: Mean Plasma Glucose: 209 mg/dL — ABNORMAL HIGH (ref <117)

## 2012-06-05 LAB — BASIC METABOLIC PANEL
BUN: 41 mg/dL — ABNORMAL HIGH (ref 6–23)
CO2: 25 mEq/L (ref 19–32)
Chloride: 97 mEq/L (ref 96–112)
Creatinine, Ser: 2.75 mg/dL — ABNORMAL HIGH (ref 0.50–1.35)

## 2012-06-05 LAB — CBC
HCT: 45.1 % (ref 39.0–52.0)
Hemoglobin: 14.9 g/dL (ref 13.0–17.0)
MCV: 99.6 fL (ref 78.0–100.0)
RBC: 4.53 MIL/uL (ref 4.22–5.81)
WBC: 9.7 10*3/uL (ref 4.0–10.5)

## 2012-06-05 NOTE — Progress Notes (Signed)
06/05/12 1745- Patient passed small kidney stone this evening. MD aware, patient has an xray ordered for 12/5 of the left flank for evaluation. Catha Gosselin RN

## 2012-06-05 NOTE — Progress Notes (Signed)
Per chart, no urine output has been charted since 10AM on 12/3. Pt stated that he has voided 4-5 times since 10AM this morning. Will continue to monitor and assess. Elyn Aquas A RN, 06/05/2012 3:12 AM

## 2012-06-05 NOTE — Progress Notes (Signed)
Subjective: flank pain this morning, no hematuria, breathing better, some dyspnea with activity.  Objective: Vital signs in last 24 hours: Filed Vitals:   06/04/12 2052 06/04/12 2339 06/05/12 0212 06/05/12 0458  BP: 100/60  118/80 102/78  Pulse:  84 77 76  Temp:   97.6 F (36.4 C) 97.2 F (36.2 C)  TempSrc:   Oral Oral  Resp:  _0 Height:      Weight:    103.828 kg (228 lb 14.4 oz)  SpO2:   96% 100%   Weight change: 2.495 kg (5 lb 8 oz)  Intake/Output Summary (Last 24 hours) at 06/05/12 1027 Last data filed at 06/05/12 0816  Gross per 24 hour  Intake    605 ml  Output    350 ml  Net    255 ml    Physical Exam: General: Awake, Oriented, No acute distress. HEENT: EOMI. Neck: Supple CV: S1 and S2 Lungs: Good air movement, Crackles at bases bilaterally. Abdomen: Soft, Nontender, Nondistended, +bowel sounds, mild L flank tenderness Ext: Good pulses. 1-2 + LE edema.   Lab Results: Basic Metabolic Panel:  Lab 82/99/37 0445 06/04/12 0304 06/03/12 2207  NA 135 133* 133*  K 4.3 4.9 4.4  CL 97 99 100  CO2 _1 GLUCOSE 134* 332* 394*  BUN 41* 22 20  CREATININE 2.75* 1.26 1.20  CALCIUM 9.5 9.1 9.4  MG -- -- --  PHOS -- -- --   Liver Function Tests:  Lab 06/03/12 2207  AST 22  ALT 26  ALKPHOS 82  BILITOT 0.7  PROT 7.6  ALBUMIN 3.8   No results found for this basename: LIPASE:5,AMYLASE:5 in the last 168 hours No results found for this basename: AMMONIA:5 in the last 168 hours CBC:  Lab 06/05/12 0445 06/04/12 0304 06/03/12 2207  WBC 9.7 7.9 9.7  NEUTROABS -- -- 8.1*  HGB 14.9 14.6 15.7  HCT 45.1 42.9 44.8  MCV 99.6 97.5 93.9  PLT 185 185 216   Cardiac Enzymes:  Lab 06/04/12 1451 06/04/12 0822 06/04/12 0304 06/03/12 2207  CKTOTAL -- -- -- --  CKMB -- -- -- --  CKMBINDEX -- -- -- --  TROPONINI <0.30 <0.30 <0.30 <0.30   BNP (last 3 results)  Basename 06/04/12 0304 06/03/12 2213  PROBNP 2992.0* 2947.0*   CBG:  Lab 06/04/12 2033 06/04/12  1625 06/04/12 1217 06/04/12 0759  GLUCAP 178* 189* 316* 241*    Basename 06/05/12 0445  HGBA1C 8.9*   Other Labs: No components found with this basename: POCBNP:3 No results found for this basename: DDIMER:2 in the last 168 hours  Lab 06/04/12 0309  CHOL 173  HDL 32*  LDLCALC 119*  TRIG 110  CHOLHDL 5.4  LDLDIRECT --   No results found for this basename: TSH,T4TOTAL,FREET3,T3FREE,FREET4,THYROIDAB in the last 168 hours No results found for this basename: VITAMINB12:2,FOLATE:2,FERRITIN:2,TIBC:2,IRON:2,RETICCTPCT:2 in the last 168 hours  Micro Results: Recent Results (from the past 240 hour(s))  MRSA PCR SCREENING     Status: Normal   Collection Time   06/04/12  2:08 AM      Component Value Range Status Comment   MRSA by PCR NEGATIVE  NEGATIVE Final     Studies/Results: Ct Abdomen Pelvis Wo Contrast  06/03/2012  *RADIOLOGY REPORT*  Clinical Data: Left flank pain and groin pain.  CT ABDOMEN AND PELVIS WITHOUT CONTRAST  Technique:  Multidetector CT imaging of the abdomen and pelvis was performed following the standard protocol without intravenous contrast.  Comparison:  None.  Findings: Mild patchy bibasilar airspace opacities raise concern for pneumonia.  A trace right pleural effusion is noted.  A few small nodes are seen about the distal esophagus, nonspecific in appearance.  Slightly prominent nodes at the hepatic hilum are also nonspecific.  The liver and spleen are unremarkable in appearance.  The gallbladder is within normal limits.  The pancreas and adrenal glands are unremarkable.  There is mild left-sided hydronephrosis, with left-sided perinephric stranding and fluid, and prominence of the left ureter along its entire course, with an obstructing 6 mm stone noted distally just proximal to the left vesicoureteral junction.  Scattered nonobstructing stones are noted at the lower pole of the left kidney, measuring up to 7 mm in size.  The right kidney is unremarkable in appearance.   No free fluid is identified.  The small bowel is unremarkable in appearance.  The stomach is within normal limits.  No acute vascular abnormalities are seen.  The appendix is normal in caliber, without evidence for appendicitis.  The colon is grossly unremarkable in appearance.  The bladder is mildly distended and otherwise unremarkable.  The prostate remains normal in size.  No inguinal lymphadenopathy is seen.  No acute osseous abnormalities are identified.  IMPRESSION:  1.  Mild left-sided hydronephrosis, with left-sided perinephric stranding and fluid, and an obstructing 6 mm stone noted distally, just proximal to the left vesicoureteral junction. 2.  Scattered nonobstructing left renal stones measure up to 7 mm in size. 3.  Mild patchy bibasilar airspace opacities, concerning for pneumonia.  Trace right pleural effusion seen. 4.  Few small nodes noted at the distal esophagus and at the hepatic hilum, nonspecific in appearance.  Hepatic hilum nodes measure up to 1.0 cm in short axis.   Original Report Authenticated By: Santa Lighter, M.D.    Dg Chest 2 View  06/03/2012  *RADIOLOGY REPORT*  Clinical Data: Shortness of breath.  Recent pneumonia.  CHEST - 2 VIEW  Comparison: 02/17/2012 and 10/08/2006  Findings: Lungs are adequately inflated dilatation or effusion. There is mild prominence of the perihilar markings suggesting mild vascular congestion.  There is borderline cardiomegaly unchanged. Remainder of the exam is unchanged.  IMPRESSION: Suggestion of mild vascular congestion and stable borderline cardiomegaly.   Original Report Authenticated By: Marin Olp, M.D.    Dg Chest Port 1 View  06/04/2012  *RADIOLOGY REPORT*  Clinical Data: CHF, shortness of breath.  PORTABLE CHEST - 1 VIEW  Comparison: 06/03/2012  Findings: Cardiomegaly with vascular congestion.  Mild interstitial prominence could reflect early interstitial edema, slightly increased since prior study.  Minimal bibasilar atelectasis.  No  visible effusions.  IMPRESSION: Cardiomegaly with vascular congestion.  Question early interstitial edema.   Original Report Authenticated By: Rolm Baptise, M.D.     Medications: I have reviewed the patient's current medications. Scheduled Meds:    . aspirin  325 mg Oral Daily  . enoxaparin (LOVENOX) injection  40 mg Subcutaneous Q24H  . influenza  inactive virus vaccine  0.5 mL Intramuscular Tomorrow-1000  . insulin aspart  0-15 Units Subcutaneous TID WC  . insulin aspart  0-5 Units Subcutaneous QHS  . insulin aspart  4 Units Subcutaneous TID WC  . insulin glargine  10 Units Subcutaneous QHS  . [COMPLETED] insulin glargine  5 Units Subcutaneous Once  . metoprolol  50 mg Oral BID  . simvastatin  40 mg Oral q1800  . sodium chloride  3 mL Intravenous Q12H  . Tamsulosin HCl  0.4 mg Oral QPC  breakfast  . [DISCONTINUED] furosemide  20 mg Intravenous Q12H  . [DISCONTINUED] furosemide  40 mg Intravenous Q12H  . [DISCONTINUED] lisinopril  5 mg Oral Daily  . [DISCONTINUED] metFORMIN  500 mg Oral BID WC  . [DISCONTINUED] metoprolol  100 mg Oral BID   Continuous Infusions:  PRN Meds:.acetaminophen, acetaminophen, alum & mag hydroxide-simeth, HYDROcodone-acetaminophen, [EXPIRED]  HYDROmorphone (DILAUDID) injection, labetalol, nitroGLYCERIN, ondansetron (ZOFRAN) IV, ondansetron, senna-docusate, sodium chloride  Assessment/Plan: Acute respiratory failure/shortness of breath Due to uncontrolled hypertension, medical noncompliance.  Improved this morning with diuresis and BiPAP.  Currently off of BiPAP i have held his diuretic today due to worsening of renal function.  Acute on chronic systolic congestive heart failure/ischemic cardiomyopathy with EF of 25-30% with diffuse hypokinesis this in a 2-D echocardiogram on 11/17/2010/coronary artery disease status post RCA stenting in the past Improved with diuresis with IV Lasix.  Hold lasix and ACE today due to ARF.  Will likely need to followup with  cardiology to reestablish care with [facility] cardiology after discharge.  Malignant hypertension/Uncontrolled hypertension Improved   Left nephrolithiasis 6 mm with mild left-sided hydronephrosis with left sided perinephric stranding and fluid with obstructing left renal stones measuring 7 mm Pain control. Dr.Reddy had discussed with Dr. Jocelyn Lamer yesterday when it was recommended for patient to followup with urology in one week after discharge.  However due to recurrent symptoms, worsening renal failure and hydronephrosis on CT will request an inpatient evaluation from urology.  Acute renal failure: multifactorial from ? Hydronephrosis, obstructing calculus, lasix/ace: Lasix and lisinopril on hold today, rest as noted above  Hyperlipidemia Continue simvastatin for now.  Resume pravastatin at discharge.  Type 2 diabetes uncontrolled with complications Last hemoglobin A1c in August of 2013 was 9.9.  Patient only on metformin.  Started lantus inpatient, titrate further.  Sliding scale insulin. Stopped metformin  Medical noncompliance/PCP Patient indicated that he wants to followup with Dr. Etter Sjogren.  In the past has seen [facility]. Will need scripts for medications at discharge as he has run out of his medications.  Mild patchy bibasilar airspace opacities on CT Not suspecting pneumonia as patient does not have cough, leukocytosis or fever.  Suspect maybe likely related to mild volume overload from congestive heart failure.  Prophylaxis Lovenox.  CODE STATUS Full code.  Disposition: inpatient    LOS: 2 days  Domenic Polite, MD 06/05/2012, 10:27 AM [phone]

## 2012-06-05 NOTE — Consult Note (Signed)
Urology Consult  Referring physician: Dr Broadus John Reason for referral: left renal stone  Chief Complaint: left renal stone  History of Present Illness: Presented Dec 3 with left flank pain; nausea and vomiting; one stone in the past; CHF and SOB as well; multiple medical co-morbidities; Cr 1.2 on admission; microhematuria; Pos. Ct scan for distal 6 mm stone on left; Cr increased to 2.75 Dec 4th; pain has been recurrent in hospital needing pain meds; feels OK now Modifying factors: There are no other modifying factors  Associated signs and symptoms: There are no other associated signs and symptoms Aggravating and relieving factors: There are no other aggravating or relieving factors Severity: Moderate  Duration: Recurrent Primary team concerned re Increase in serum Cr  Past Medical History  Diagnosis Date  . Coronary artery disease     drug eluting sten RCA 2005-EF 30% at that time  . Cardiomyopathy     alcohol use related  . Asthma   . Hypertension   . Urinary incontinence   . Hyperlipidemia   . History of hypogonadism   . Pneumonia 02/16/2012    "today makes third time in my life"  . OSA on CPAP 02/16/2012    "wear mask sometimes"  . Diabetes mellitus type II   . H/O hiatal hernia   . GERD (gastroesophageal reflux disease)   . Migraines 02/16/2012    "used to"  . Anxiety    Past Surgical History  Procedure Date  . Tonsillectomy ~ 1970  . Coronary angioplasty with stent placement ~ 2003  . Refractive surgery 1990's    bilaterally    Medications: I have reviewed the patient's current medications. Allergies: No Known Allergies  Family History  Problem Relation Age of Onset  . Hypertension Mother   . Diabetes Mother   . Hypertension Father   . Diabetes Father   . Hyperlipidemia Father    Social History:  reports that he has never smoked. His smokeless tobacco use includes Chew. He reports that he drinks alcohol. He reports that he does not use illicit drugs.  ROS: All  systems are reviewed and negative except as noted. Rest ROS negative  Physical Exam:  Vital signs in last 24 hours: Temp:  [97.2 F (36.2 C)-98.1 F (36.7 C)] 98.1 F (36.7 C) (12/04 1303) Pulse Rate:  [73-84] 75  (12/04 1303) Resp:  [16-20] 19  (12/04 1303) BP: (80-118)/(45-80) 80/45 mmHg (12/04 1303) SpO2:  [92 %-100 %] 93 % (12/04 1303) Weight:  [103.828 kg (228 lb 14.4 oz)-104.101 kg (229 lb 8 oz)] 103.828 kg (228 lb 14.4 oz) (12/04 0458)  Cardiovascular: Skin warm; not flushed Respiratory: Breaths quiet; no shortness of breath Abdomen: No masses Neurological: Normal sensation to touch Musculoskeletal: Normal motor function arms and legs Lymphatics: No inguinal adenopathy Skin: No rashes Genitourinary:No CVA tenderness; comfortable;non-toxic  Laboratory Data:  Results for orders placed during the hospital encounter of 06/03/12 (from the past 72 hour(s))  TROPONIN I     Status: Normal   Collection Time   06/03/12 10:07 PM      Component Value Range Comment   Troponin I <0.30  <0.30 ng/mL   CBC WITH DIFFERENTIAL     Status: Abnormal   Collection Time   06/03/12 10:07 PM      Component Value Range Comment   WBC 9.7  4.0 - 10.5 K/uL    RBC 4.77  4.22 - 5.81 MIL/uL    Hemoglobin 15.7  13.0 - 17.0 g/dL  HCT 44.8  39.0 - 52.0 %    MCV 93.9  78.0 - 100.0 fL    MCH 32.9  26.0 - 34.0 pg    MCHC 35.0  30.0 - 36.0 g/dL    RDW 15.0  11.5 - 15.5 %    Platelets 216  150 - 400 K/uL    Neutrophils Relative 83 (*) 43 - 77 %    Neutro Abs 8.1 (*) 1.7 - 7.7 K/uL    Lymphocytes Relative 10 (*) 12 - 46 %    Lymphs Abs 1.0  0.7 - 4.0 K/uL    Monocytes Relative 7  3 - 12 %    Monocytes Absolute 0.7  0.1 - 1.0 K/uL    Eosinophils Relative 1  0 - 5 %    Eosinophils Absolute 0.1  0.0 - 0.7 K/uL    Basophils Relative 0  0 - 1 %    Basophils Absolute 0.0  0.0 - 0.1 K/uL   COMPREHENSIVE METABOLIC PANEL     Status: Abnormal   Collection Time   06/03/12 10:07 PM      Component Value Range  Comment   Sodium 133 (*) 135 - 145 mEq/L    Potassium 4.4  3.5 - 5.1 mEq/L    Chloride 100  96 - 112 mEq/L    CO2 20  19 - 32 mEq/L    Glucose, Bld 394 (*) 70 - 99 mg/dL    BUN 20  6 - 23 mg/dL    Creatinine, Ser 1.20  0.50 - 1.35 mg/dL    Calcium 9.4  8.4 - 10.5 mg/dL    Total Protein 7.6  6.0 - 8.3 g/dL    Albumin 3.8  3.5 - 5.2 g/dL    AST 22  0 - 37 U/L    ALT 26  0 - 53 U/L    Alkaline Phosphatase 82  39 - 117 U/L    Total Bilirubin 0.7  0.3 - 1.2 mg/dL    GFR calc non Af Amer 69 (*) >90 mL/min    GFR calc Af Amer 80 (*) >90 mL/min   PRO B NATRIURETIC PEPTIDE     Status: Abnormal   Collection Time   06/03/12 10:13 PM      Component Value Range Comment   Pro B Natriuretic peptide (BNP) 2947.0 (*) 0 - 125 pg/mL   URINALYSIS, ROUTINE W REFLEX MICROSCOPIC     Status: Abnormal   Collection Time   06/03/12 11:05 PM      Component Value Range Comment   Color, Urine YELLOW  YELLOW    APPearance CLEAR  CLEAR    Specific Gravity, Urine 1.026  1.005 - 1.030    pH 5.5  5.0 - 8.0    Glucose, UA >1000 (*) NEGATIVE mg/dL    Hgb urine dipstick LARGE (*) NEGATIVE    Bilirubin Urine NEGATIVE  NEGATIVE    Ketones, ur NEGATIVE  NEGATIVE mg/dL    Protein, ur 100 (*) NEGATIVE mg/dL    Urobilinogen, UA 1.0  0.0 - 1.0 mg/dL    Nitrite NEGATIVE  NEGATIVE    Leukocytes, UA NEGATIVE  NEGATIVE   URINE MICROSCOPIC-ADD ON     Status: Normal   Collection Time   06/03/12 11:05 PM      Component Value Range Comment   Squamous Epithelial / LPF RARE  RARE    WBC, UA 0-2  <3 WBC/hpf    RBC / HPF 7-10  <3 RBC/hpf  MRSA PCR SCREENING     Status: Normal   Collection Time   06/04/12  2:08 AM      Component Value Range Comment   MRSA by PCR NEGATIVE  NEGATIVE   CBC     Status: Normal   Collection Time   06/04/12  3:04 AM      Component Value Range Comment   WBC 7.9  4.0 - 10.5 K/uL    RBC 4.40  4.22 - 5.81 MIL/uL    Hemoglobin 14.6  13.0 - 17.0 g/dL    HCT 42.9  39.0 - 52.0 %    MCV 97.5  78.0 -  100.0 fL    MCH 33.2  26.0 - 34.0 pg    MCHC 34.0  30.0 - 36.0 g/dL    RDW 15.2  11.5 - 15.5 %    Platelets 185  150 - 400 K/uL   BASIC METABOLIC PANEL     Status: Abnormal   Collection Time   06/04/12  3:04 AM      Component Value Range Comment   Sodium 133 (*) 135 - 145 mEq/L    Potassium 4.9  3.5 - 5.1 mEq/L    Chloride 99  96 - 112 mEq/L    CO2 22  19 - 32 mEq/L    Glucose, Bld 332 (*) 70 - 99 mg/dL    BUN 22  6 - 23 mg/dL    Creatinine, Ser 1.26  0.50 - 1.35 mg/dL    Calcium 9.1  8.4 - 10.5 mg/dL    GFR calc non Af Amer 65 (*) >90 mL/min    GFR calc Af Amer 75 (*) >90 mL/min   PRO B NATRIURETIC PEPTIDE     Status: Abnormal   Collection Time   06/04/12  3:04 AM      Component Value Range Comment   Pro B Natriuretic peptide (BNP) 2992.0 (*) 0 - 125 pg/mL   TROPONIN I     Status: Normal   Collection Time   06/04/12  3:04 AM      Component Value Range Comment   Troponin I <0.30  <0.30 ng/mL   LIPID PANEL     Status: Abnormal   Collection Time   06/04/12  3:09 AM      Component Value Range Comment   Cholesterol 173  0 - 200 mg/dL    Triglycerides 110  <150 mg/dL    HDL 32 (*) >39 mg/dL    Total CHOL/HDL Ratio 5.4      VLDL 22  0 - 40 mg/dL    LDL Cholesterol 119 (*) 0 - 99 mg/dL   GLUCOSE, CAPILLARY     Status: Abnormal   Collection Time   06/04/12  7:59 AM      Component Value Range Comment   Glucose-Capillary 241 (*) 70 - 99 mg/dL   TROPONIN I     Status: Normal   Collection Time   06/04/12  8:22 AM      Component Value Range Comment   Troponin I <0.30  <0.30 ng/mL   GLUCOSE, CAPILLARY     Status: Abnormal   Collection Time   06/04/12 12:17 PM      Component Value Range Comment   Glucose-Capillary 316 (*) 70 - 99 mg/dL   TROPONIN I     Status: Normal   Collection Time   06/04/12  2:51 PM      Component Value Range Comment   Troponin I <0.30  <  0.30 ng/mL   GLUCOSE, CAPILLARY     Status: Abnormal   Collection Time   06/04/12  4:25 PM      Component Value Range  Comment   Glucose-Capillary 189 (*) 70 - 99 mg/dL   GLUCOSE, CAPILLARY     Status: Abnormal   Collection Time   06/04/12  8:33 PM      Component Value Range Comment   Glucose-Capillary 178 (*) 70 - 99 mg/dL    Comment 1 Documented in Chart      Comment 2 Notify RN     CBC     Status: Abnormal   Collection Time   06/05/12  4:45 AM      Component Value Range Comment   WBC 9.7  4.0 - 10.5 K/uL    RBC 4.53  4.22 - 5.81 MIL/uL    Hemoglobin 14.9  13.0 - 17.0 g/dL    HCT 45.1  39.0 - 52.0 %    MCV 99.6  78.0 - 100.0 fL    MCH 32.9  26.0 - 34.0 pg    MCHC 33.0  30.0 - 36.0 g/dL    RDW 15.6 (*) 11.5 - 15.5 %    Platelets 185  150 - 400 K/uL   BASIC METABOLIC PANEL     Status: Abnormal   Collection Time   06/05/12  4:45 AM      Component Value Range Comment   Sodium 135  135 - 145 mEq/L    Potassium 4.3  3.5 - 5.1 mEq/L    Chloride 97  96 - 112 mEq/L    CO2 25  19 - 32 mEq/L    Glucose, Bld 134 (*) 70 - 99 mg/dL    BUN 41 (*) 6 - 23 mg/dL DELTA CHECK NOTED   Creatinine, Ser 2.75 (*) 0.50 - 1.35 mg/dL DELTA CHECK NOTED   Calcium 9.5  8.4 - 10.5 mg/dL    GFR calc non Af Amer 25 (*) >90 mL/min    GFR calc Af Amer 29 (*) >90 mL/min   HEMOGLOBIN A1C     Status: Abnormal   Collection Time   06/05/12  4:45 AM      Component Value Range Comment   Hemoglobin A1C 8.9 (*) <5.7 %    Mean Plasma Glucose 209 (*) <117 mg/dL   GLUCOSE, CAPILLARY     Status: Abnormal   Collection Time   06/05/12  6:18 AM      Component Value Range Comment   Glucose-Capillary 140 (*) 70 - 99 mg/dL   GLUCOSE, CAPILLARY     Status: Abnormal   Collection Time   06/05/12 11:05 AM      Component Value Range Comment   Glucose-Capillary 213 (*) 70 - 99 mg/dL    Comment 1 Notify RN      Comment 2 Documented in Chart      Recent Results (from the past 240 hour(s))  MRSA PCR SCREENING     Status: Normal   Collection Time   06/04/12  2:08 AM      Component Value Range Status Comment   MRSA by PCR NEGATIVE  NEGATIVE  Final    Creatinine:  Basename 06/05/12 0445 06/04/12 0304 06/03/12 2207  CREATININE 2.75* 1.26 1.20    Xrays: See report/chart I reviewed CT scan   Impression/Assessment:  Distal left ureteral stone with recurrent pain and increase in serum Cr possibly multi-factorial Drew patient picture and discussed pros and cons and risks of  stent  Plan:  Repeat labs in am If Cr still elevated and stone not passed will schedule stent on Friday Get KUB tomorrow am and start flomax  Carron Jaggi A 06/05/2012, 2:36 PM

## 2012-06-05 NOTE — Progress Notes (Signed)
06/05/12 1315 Spoke with pt. about medication assistance.  Pt. states that he will be signing up for the Affordable Care insurance on 06/07/12.  Pt. is eligible for the Medication Assistance [facility]) program that would give pt. a card that pt. could take to a [place] to participating pharmacy and obtain 34days of his medications for free.  Pt. was interested in this.  Physician, please write for 34 day rx on dc.   Llana Aliment, RN, BSN NCM [phone]

## 2012-06-06 ENCOUNTER — Inpatient Hospital Stay (HOSPITAL_COMMUNITY): Payer: Self-pay

## 2012-06-06 DIAGNOSIS — I2589 Other forms of chronic ischemic heart disease: Secondary | ICD-10-CM

## 2012-06-06 LAB — BASIC METABOLIC PANEL
BUN: 56 mg/dL — ABNORMAL HIGH (ref 6–23)
BUN: 62 mg/dL — ABNORMAL HIGH (ref 6–23)
Chloride: 99 mEq/L (ref 96–112)
Creatinine, Ser: 2.94 mg/dL — ABNORMAL HIGH (ref 0.50–1.35)
GFR calc Af Amer: 29 mL/min — ABNORMAL LOW (ref >90)
GFR calc non Af Amer: 23 mL/min — ABNORMAL LOW (ref >90)
Glucose, Bld: 150 mg/dL — ABNORMAL HIGH (ref 70–99)
Potassium: 5.2 mEq/L — ABNORMAL HIGH (ref 3.5–5.1)
Potassium: 4.4 mEq/L (ref 3.5–5.1)

## 2012-06-06 LAB — CBC
HCT: 44.3 % (ref 39.0–52.0)
Hemoglobin: 14.9 g/dL (ref 13.0–17.0)
MCHC: 33.6 g/dL (ref 30.0–36.0)
MCV: 98.9 fL (ref 78.0–100.0)

## 2012-06-06 LAB — GLUCOSE, CAPILLARY
Glucose-Capillary: 157 mg/dL — ABNORMAL HIGH (ref 70–99)
Glucose-Capillary: 151 mg/dL — ABNORMAL HIGH (ref 70–99)
Glucose-Capillary: 143 mg/dL — ABNORMAL HIGH (ref 70–99)
Glucose-Capillary: 136 mg/dL — ABNORMAL HIGH (ref 70–99)

## 2012-06-06 LAB — LACTIC ACID, PLASMA: Lactic Acid, Venous: 1.7 mmol/L (ref 0.5–2.2)

## 2012-06-06 MED ORDER — ENOXAPARIN SODIUM 60 MG/0.6ML ~~LOC~~ SOLN
50.0000 mg | SUBCUTANEOUS | Status: DC
  Administered 2012-06-06 – 2012-06-07 (×2): 50 mg via SUBCUTANEOUS
  Filled 2012-06-06 (×3): qty 0.6

## 2012-06-06 MED ORDER — SODIUM CHLORIDE 0.9 % IV SOLN
INTRAVENOUS | Status: DC
  Administered 2012-06-06: 100 mL/h via INTRAVENOUS

## 2012-06-06 MED ORDER — SODIUM CHLORIDE 0.9 % IV BOLUS (SEPSIS)
500.0000 mL | Freq: Once | INTRAVENOUS | Status: AC
  Administered 2012-06-06: 500 mL via INTRAVENOUS

## 2012-06-06 MED ORDER — SODIUM CHLORIDE 0.9 % IV SOLN: INTRAVENOUS | Status: DC

## 2012-06-06 MED ORDER — SODIUM CHLORIDE 0.9 % IV SOLN
INTRAVENOUS | Status: DC
  Administered 2012-06-06 – 2012-06-08 (×4): via INTRAVENOUS

## 2012-06-06 NOTE — Progress Notes (Signed)
RT Note: pt refused to wear CPAP tonight. Pt informed that if he changed his mind to let his RN know. RT and RN will monitor.

## 2012-06-06 NOTE — Progress Notes (Addendum)
Subjective: Passed stone yesterday evening, breathing ok  Objective: Vital signs in last 24 hours: Filed Vitals:   06/05/12 1303 06/05/12 2041 06/05/12 2159 06/06/12 0515  BP: 80/45 99/68  85/61  Pulse: 75 72 70 67  Temp: 98.1 F (36.7 C) 97.4 F (36.3 C)  97.4 F (36.3 C)  TempSrc: Oral Oral  Oral  Resp: _0 Height:      Weight:    104.191 kg (229 lb 11.2 oz)  SpO2: 93% 95% 96% 93%   Weight change: 0.091 kg (3.2 oz)  Intake/Output Summary (Last 24 hours) at 06/06/12 1012 Last data filed at 06/06/12 0930  Gross per 24 hour  Intake   1021 ml  Output    800 ml  Net    221 ml    Physical Exam: General: Awake, Oriented, No acute distress. HEENT: EOMI. Neck: Supple CV: S1 and S2 Lungs: Good air movement, Clear today Abdomen: Soft, Nontender, Nondistended, +bowel sounds, no flank tenderness Ext: Good pulses. trace LE edema.   Lab Results: Basic Metabolic Panel:  Lab 18/34/37 0620 06/05/12 0445 06/04/12 0304 06/03/12 2207  NA 133* 135 133* 133*  K 5.2* 4.3 4.9 4.4  CL 97 97 99 100  CO2 _1 GLUCOSE 150* 134* 332* 394*  BUN 56* 41* 22 20  CREATININE 2.94* 2.75* 1.26 1.20  CALCIUM 9.2 9.5 9.1 9.4  MG -- -- -- --  PHOS -- -- -- --   Liver Function Tests:  Lab 06/03/12 2207  AST 22  ALT 26  ALKPHOS 82  BILITOT 0.7  PROT 7.6  ALBUMIN 3.8   No results found for this basename: LIPASE:5,AMYLASE:5 in the last 168 hours No results found for this basename: AMMONIA:5 in the last 168 hours CBC:  Lab 06/06/12 0620 06/05/12 0445 06/04/12 0304 06/03/12 2207  WBC 11.9* 9.7 7.9 9.7  NEUTROABS -- -- -- 8.1*  HGB 14.9 14.9 14.6 15.7  HCT 44.3 45.1 42.9 44.8  MCV 98.9 99.6 97.5 93.9  PLT 199 185 185 216   Cardiac Enzymes:  Lab 06/04/12 1451 06/04/12 0822 06/04/12 0304 06/03/12 2207  CKTOTAL -- -- -- --  CKMB -- -- -- --  CKMBINDEX -- -- -- --  TROPONINI <0.30 <0.30 <0.30 <0.30   BNP (last 3 results)  Basename 06/04/12 0304 06/03/12 2213   PROBNP 2992.0* 2947.0*   CBG:  Lab 06/06/12 0549 06/05/12 2119 06/05/12 1648 06/05/12 1105 06/05/12 0618  GLUCAP 157* 153* 134* 213* 140*    Basename 06/05/12 0445  HGBA1C 8.9*   Other Labs: No components found with this basename: POCBNP:3 No results found for this basename: DDIMER:2 in the last 168 hours  Lab 06/04/12 0309  CHOL 173  HDL 32*  LDLCALC 119*  TRIG 110  CHOLHDL 5.4  LDLDIRECT --   No results found for this basename: TSH,T4TOTAL,FREET3,T3FREE,FREET4,THYROIDAB in the last 168 hours No results found for this basename: VITAMINB12:2,FOLATE:2,FERRITIN:2,TIBC:2,IRON:2,RETICCTPCT:2 in the last 168 hours  Micro Results: Recent Results (from the past 240 hour(s))  MRSA PCR SCREENING     Status: Normal   Collection Time   06/04/12  2:08 AM      Component Value Range Status Comment   MRSA by PCR NEGATIVE  NEGATIVE Final     Studies/Results: Dg Abd 1 View  06/06/2012  *RADIOLOGY REPORT*  Clinical Data: Kidney stone  ABDOMEN - 1 VIEW  Comparison: CT 06/03/2012  Findings: 6 mm stone in the distal left ureter on recent  CT is no longer visualized and may have passed.  7 mm calculus in the left kidney is not well seen however this is obscured by bowel gas.  No definite kidney stones.  Negative for bowel obstruction.  No acute bony abnormality.  Soft tissue calcification in the right buttock.  IMPRESSION: Distal left ureteral stone no longer visualized and may have passed.  Left renal calculus not visualized probably due to overlying bowel gas.   Original Report Authenticated By: Carl Best, M.D.     Medications: I have reviewed the patient's current medications. Scheduled Meds:    . aspirin  325 mg Oral Daily  . [COMPLETED] influenza  inactive virus vaccine  0.5 mL Intramuscular Tomorrow-1000  . insulin aspart  0-15 Units Subcutaneous TID WC  . insulin aspart  0-5 Units Subcutaneous QHS  . insulin aspart  4 Units Subcutaneous TID WC  . insulin glargine  10 Units  Subcutaneous QHS  . metoprolol  50 mg Oral BID  . simvastatin  40 mg Oral q1800  . sodium chloride  3 mL Intravenous Q12H  . [DISCONTINUED] enoxaparin (LOVENOX) injection  40 mg Subcutaneous Q24H  . [DISCONTINUED] Tamsulosin HCl  0.4 mg Oral QPC breakfast   Continuous Infusions:    . sodium chloride 100 mL/hr (06/06/12 1010)   PRN Meds:.acetaminophen, acetaminophen, alum & mag hydroxide-simeth, HYDROcodone-acetaminophen, labetalol, nitroGLYCERIN, ondansetron (ZOFRAN) IV, ondansetron, senna-docusate, sodium chloride  Assessment/Plan: Acute respiratory failure/shortness of breath Due to uncontrolled hypertension, medical noncompliance.  Improved this morning with diuresis and BiPAP.  Currently off of BiPAP i have held his diuretic due to worsening of renal function.  Acute on chronic systolic congestive heart failure/ischemic cardiomyopathy with EF of 25-30% with diffuse hypokinesis this in a 2-D echocardiogram on 11/17/2010/coronary artery disease status post RCA stenting in the past Improved with diuresis with IV Lasix.  Hold lasix and ACE today due to ARF.  Will likely need to followup with cardiology to reestablish care with Dr.Klein after discharge.  Malignant hypertension/Uncontrolled hypertension Improved   Left nephrolithiasis 6 mm with mild left-sided hydronephrosis with left sided perinephric stranding and fluid with obstructing left renal stones measuring 7 mm Appreciate Urology consult, passed stone, creatinine a little worse, repeat USG in am if kidneys not better to check for improvement of hydronephrosis  Acute renal failure: multifactorial from ? Hydronephrosis, obstructing calculus since passed, lasix/ace: Lasix and lisinopril on hold since yesterday, hydrate with IVF today, bmet in am, repeat USG tomorrow if kidney function does not start improving  Hyperlipidemia Continue simvastatin for now.  Resume pravastatin at discharge.  Type 2 diabetes uncontrolled with  complications Last hemoglobin A1c in August of 2013 was 9.9.  Patient only on metformin.  Started lantus inpatient, titrate further.  Sliding scale insulin. Stopped metformin  Medical noncompliance/PCP Patient indicated that he wants to followup with Dr. Etter Sjogren.  In the past has seen [facility]. Will need scripts for medications at discharge as he has run out of his medications.  Prophylaxis Lovenox.  CODE STATUS Full code.  Disposition: inpatient Home in 1-2 days    LOS: 3 days  Domenic Polite, MD 06/06/2012, 10:12 AM [phone]

## 2012-06-06 NOTE — Progress Notes (Signed)
Patient passed stone KUB normal  Serum cr should settle down if partially due to stone

## 2012-06-06 NOTE — Progress Notes (Signed)
Anticoagulation consult: Lovenox Indication: VTE prophylaxis  Lovenox for VTE prophylaxis in pt >30 BMI=0.31m/kg 104 kg x 0.5 = 52.  Round to 50 mg Lovenox once a day for VTE prophylaxis.  BArrie Senate PharmD

## 2012-06-07 LAB — CBC
HCT: 45.4 % (ref 39.0–52.0)
Hemoglobin: 15.3 g/dL (ref 13.0–17.0)
MCHC: 33.7 g/dL (ref 30.0–36.0)
RBC: 4.63 MIL/uL (ref 4.22–5.81)

## 2012-06-07 LAB — GLUCOSE, CAPILLARY
Glucose-Capillary: 104 mg/dL — ABNORMAL HIGH (ref 70–99)
Glucose-Capillary: 181 mg/dL — ABNORMAL HIGH (ref 70–99)
Glucose-Capillary: 192 mg/dL — ABNORMAL HIGH (ref 70–99)

## 2012-06-07 LAB — BASIC METABOLIC PANEL
Calcium: 8.7 mg/dL (ref 8.4–10.5)
Creatinine, Ser: 2.33 mg/dL — ABNORMAL HIGH (ref 0.50–1.35)
GFR calc Af Amer: 36 mL/min — ABNORMAL LOW (ref >90)

## 2012-06-07 NOTE — Progress Notes (Signed)
06/07/12 1650 Pt. may dc home tomorrow.  Pt. is eligible for the [facility] program to obtain 34days of Free medications.  Physician please write for 34day medications.  The weekend Case Manager will print out a card for pt. to take with him to obtain his medications.  Llana Aliment, RN, BSN NCM [phone]

## 2012-06-07 NOTE — Progress Notes (Signed)
Subjective: Doing well, no complaints, anxious to go home, breathing ok  Objective: Vital signs in last 24 hours: Filed Vitals:   06/06/12 2004 06/06/12 2213 06/07/12 0519 06/07/12 1019  BP: 102/76 106/80 104/75 130/96  Pulse: 72 71 69 76  Temp: 97.2 F (36.2 C)  97 F (36.1 C)   TempSrc: Oral  Axillary   Resp: _0 Height:      Weight:   105.416 kg (232 lb 6.4 oz)   SpO2: 93%  100% 100%   Weight change: 1.225 kg (2 lb 11.2 oz)  Intake/Output Summary (Last 24 hours) at 06/07/12 1321 Last data filed at 06/07/12 8590  Gross per 24 hour  Intake   1203 ml  Output    100 ml  Net   1103 ml    Physical Exam: General: Awake, Oriented, No acute distress. HEENT: EOMI. Neck: Supple CV: S1 and S2 Lungs: Good air movement, Clear today Abdomen: Soft, Nontender, Nondistended, +bowel sounds, no flank tenderness Ext: Good pulses. trace LE edema.   Lab Results: Basic Metabolic Panel:  Lab 93/11/21 0545 06/06/12 1440 06/06/12 0620 06/05/12 0445 06/04/12 0304  NA 138 134* 133* 135 133*  K 4.9 4.4 5.2* 4.3 4.9  CL 102 99 97 97 99  CO2 _1 GLUCOSE 116* 158* 150* 134* 332*  BUN 63* 62* 56* 41* 22  CREATININE 2.33* 2.78* 2.94* 2.75* 1.26  CALCIUM 8.7 9.0 9.2 9.5 9.1  MG -- -- -- -- --  PHOS -- -- -- -- --   Liver Function Tests:  Lab 06/03/12 2207  AST 22  ALT 26  ALKPHOS 82  BILITOT 0.7  PROT 7.6  ALBUMIN 3.8   No results found for this basename: LIPASE:5,AMYLASE:5 in the last 168 hours No results found for this basename: AMMONIA:5 in the last 168 hours CBC:  Lab 06/07/12 0545 06/06/12 0620 06/05/12 0445 06/04/12 0304 06/03/12 2207  WBC 10.2 11.9* 9.7 7.9 9.7  NEUTROABS -- -- -- -- 8.1*  HGB 15.3 14.9 14.9 14.6 15.7  HCT 45.4 44.3 45.1 42.9 44.8  MCV 98.1 98.9 99.6 97.5 93.9  PLT 210 199 185 185 216   Cardiac Enzymes:  Lab 06/04/12 1451 06/04/12 0822 06/04/12 0304 06/03/12 2207  CKTOTAL -- -- -- --  CKMB -- -- -- --  CKMBINDEX -- -- -- --   TROPONINI <0.30 <0.30 <0.30 <0.30   BNP (last 3 results)  Basename 06/04/12 0304 06/03/12 2213  PROBNP 2992.0* 2947.0*   CBG:  Lab 06/07/12 1109 06/07/12 0635 06/06/12 2105 06/06/12 1611 06/06/12 1121  GLUCAP 181* 104* 136* 143* 151*    Basename 06/05/12 0445  HGBA1C 8.9*   Other Labs: No components found with this basename: POCBNP:3 No results found for this basename: DDIMER:2 in the last 168 hours  Lab 06/04/12 0309  CHOL 173  HDL 32*  LDLCALC 119*  TRIG 110  CHOLHDL 5.4  LDLDIRECT --   No results found for this basename: TSH,T4TOTAL,FREET3,T3FREE,FREET4,THYROIDAB in the last 168 hours No results found for this basename: VITAMINB12:2,FOLATE:2,FERRITIN:2,TIBC:2,IRON:2,RETICCTPCT:2 in the last 168 hours  Micro Results: Recent Results (from the past 240 hour(s))  MRSA PCR SCREENING     Status: Normal   Collection Time   06/04/12  2:08 AM      Component Value Range Status Comment   MRSA by PCR NEGATIVE  NEGATIVE Final     Studies/Results: Dg Abd 1 View  06/06/2012  *RADIOLOGY REPORT*  Clinical Data: Kidney  stone  ABDOMEN - 1 VIEW  Comparison: CT 06/03/2012  Findings: 6 mm stone in the distal left ureter on recent CT is no longer visualized and may have passed.  7 mm calculus in the left kidney is not well seen however this is obscured by bowel gas.  No definite kidney stones.  Negative for bowel obstruction.  No acute bony abnormality.  Soft tissue calcification in the right buttock.  IMPRESSION: Distal left ureteral stone no longer visualized and may have passed.  Left renal calculus not visualized probably due to overlying bowel gas.   Original Report Authenticated By: Carl Best, M.D.     Medications: I have reviewed the patient's current medications. Scheduled Meds:    . aspirin  325 mg Oral Daily  . enoxaparin (LOVENOX) injection  50 mg Subcutaneous Q24H  . insulin aspart  0-15 Units Subcutaneous TID WC  . insulin aspart  0-5 Units Subcutaneous QHS  . insulin  aspart  4 Units Subcutaneous TID WC  . insulin glargine  10 Units Subcutaneous QHS  . metoprolol  50 mg Oral BID  . simvastatin  40 mg Oral q1800  . [COMPLETED] sodium chloride  500 mL Intravenous Once   Continuous Infusions:    . sodium chloride Stopped (06/07/12 1030)  . [DISCONTINUED] sodium chloride 100 mL/hr (06/06/12 1010)  . [DISCONTINUED] sodium chloride     PRN Meds:.acetaminophen, acetaminophen, alum & mag hydroxide-simeth, HYDROcodone-acetaminophen, labetalol, nitroGLYCERIN, ondansetron (ZOFRAN) IV, ondansetron, senna-docusate, sodium chloride  Assessment/Plan: Acute respiratory failure/shortness of breath Due to uncontrolled hypertension, medical noncompliance.  Improved on admission with diuresis and BiPAP. i have held his diuretic due to worsening of renal function.  Acute on chronic systolic congestive heart failure/ischemic cardiomyopathy with EF of 25-30% with diffuse hypokinesis this in a 2-D echocardiogram on 11/17/2010/coronary artery disease status post RCA stenting in the past Improved with diuresis with IV Lasix.  Hold lasix and ACE today due to ARF.  Will need to followup with cardiology to reestablish care with Dr.Klein after discharge.  Malignant hypertension/Uncontrolled hypertension Improved   Left nephrolithiasis 6 mm with mild left-sided hydronephrosis with left sided perinephric stranding and fluid with obstructing left renal stones measuring 7 mm Appreciate Urology consult, passed stone, creatinine starting to improve.  Acute renal failure: multifactorial from ? Hydronephrosis, obstructing calculus since passed, lasix/ace: Lasix and lisinopril on hold for 2 days, finally improving, continue IVF decrease rate to 80m/hr,  bmet in am  Hyperlipidemia Continue simvastatin for now.  Resume pravastatin at discharge.  Type 2 diabetes uncontrolled with complications Last hemoglobin A1c in August of 2013 was 9.9.  Patient only on metformin.  Started lantus  inpatient, titrate further.  Sliding scale insulin. Stopped metformin  Medical noncompliance/PCP Patient indicated that he wants to followup with Dr. LEtter Sjogren  [facility]. Will need scripts for medications at discharge as he has run out of his medications.  Prophylaxis Lovenox.  CODE STATUS Full code.  Disposition: inpatient Home in 1day if  Creatinine improving    LOS: 4 days  JDomenic Polite MD 06/07/2012, 1:21 PM [phone]

## 2012-06-07 NOTE — Plan of Care (Signed)
Problem: Limited Adherence to Nutrition-Related Recommendations (NB-1.6) Goal: Nutrition education Formal process to instruct or train a patient/client in a skill or to impart knowledge to help patients/clients voluntarily manage or modify food choices and eating behavior to maintain or improve health.  Outcome: Completed/Met Date Met:  06/07/12  RD consulted for nutrition education regarding diabetes.     Lab Results  Component Value Date    HGBA1C 8.9* 06/05/2012    RD provided "Carbohydrate Counting for People with Diabetes" handout from the Academy of [employer] and Dietetics. Discussed different food groups and their effects on blood sugar, emphasizing carbohydrate-containing foods. Provided list of carbohydrates and recommended serving sizes of common foods.  Discussed importance of controlled and consistent carbohydrate intake throughout the day. Provided examples of ways to balance meals/snacks and encouraged intake of high-fiber, whole grain complex carbohydrates.  Pt also with CHF. Discussed ways to also follow a low sodium diet with DM. Pt reports he was not taking medications and that is the cause of these problems. Explained that diet can work with medications to promote health and wellness, and that taking medications does not give him the liberty to eat what ever he wants. Pt verbalized understanding. Seems ready to make changes, stated "I am tired of feeling sick" and was able to state plans for post d/c medication and diet compliance.    Teach back method used.  Expect fair compliance.  Body mass index is 33.35 kg/(m^2). Pt meets criteria for obesity class 2 based on current BMI.  Current diet order is Carb Mod Medium, patient is consuming approximately 100% of meals at this time. Labs and medications reviewed. No further nutrition interventions warranted at this time. RD contact information provided. If additional nutrition issues arise, please re-consult RD.  Vincent Chandler  RD, LDN Pager [phone] After Hours pager [phone]

## 2012-06-08 LAB — BASIC METABOLIC PANEL
BUN: 44 mg/dL — ABNORMAL HIGH (ref 6–23)
CO2: 21 mEq/L (ref 19–32)
Chloride: 104 mEq/L (ref 96–112)
Creatinine, Ser: 1.53 mg/dL — ABNORMAL HIGH (ref 0.50–1.35)
GFR calc Af Amer: 60 mL/min — ABNORMAL LOW (ref >90)
Glucose, Bld: 188 mg/dL — ABNORMAL HIGH (ref 70–99)
Potassium: 4.2 mEq/L (ref 3.5–5.1)

## 2012-06-08 LAB — GLUCOSE, CAPILLARY

## 2012-06-08 MED ORDER — GLIPIZIDE 5 MG PO TABS: 5.0000 mg | ORAL_TABLET | Freq: Two times a day (BID) | ORAL | Status: DC

## 2012-06-08 MED ORDER — METOPROLOL TARTRATE 50 MG PO TABS: 50.0000 mg | ORAL_TABLET | Freq: Two times a day (BID) | ORAL | Status: DC

## 2012-06-08 MED ORDER — PRAVASTATIN SODIUM 40 MG PO TABS: 40.0000 mg | ORAL_TABLET | Freq: Every day | ORAL | Status: DC

## 2012-06-08 NOTE — Discharge Summary (Signed)
Physician Discharge Summary  Patient ID: Vincent Chandler MRN: [id number] DOB/AGE: 11/14/1960 51 y.o.  Admit date: 06/03/2012 Discharge date: 06/08/2012  Primary Care Physician:  Garnet Koyanagi, DO  Disposition and follow up FU: PCP in 1 week Bmet in 1 week  Discharge Diagnoses:    Acute renal failure  Left Ureteral stone with nephrolithiasis, passed stone  Left Hydronephrosis  DIABETES MELLITUS, TYPE II  HYPERLIPIDEMIA  HYPERTENSION  Coronary artery disease prior RCA stent with new inferior Q waves  Ischemic and nonischemic cardiomyopathy EF 35% 2006  Hypertensive emergency  CHF (congestive heart failure)      Medication List     As of 06/08/2012 11:10 AM    STOP taking these medications         lisinopril 5 MG tablet   Commonly known as: PRINIVIL,ZESTRIL      metFORMIN 500 MG 24 hr tablet   Commonly known as: GLUCOPHAGE-XR      TAKE these medications         aspirin EC 81 MG tablet   Take 81 mg by mouth daily.      glipiZIDE 5 MG tablet   Commonly known as: GLUCOTROL   Take 1 tablet (5 mg total) by mouth 2 (two) times daily before a meal.      metoprolol 50 MG tablet   Commonly known as: LOPRESSOR   Take 1 tablet (50 mg total) by mouth 2 (two) times daily.      pravastatin 40 MG tablet   Commonly known as: PRAVACHOL   Take 1 tablet (40 mg total) by mouth daily.         Disposition and Follow-up:  PCP in 1 week   Significant Diagnostic Studies:  ABDOMEN - 1 VIEW IMPRESSION: Distal left ureteral stone no longer visualized and may have passed. Left renal calculus not visualized probably due to overlying bowel gas.  PORTABLE CHEST - 1 VIEW IMPRESSION: Cardiomegaly with vascular congestion. Question early interstitial edema.  CT ABDOMEN AND PELVIS WITHOUT CONTRAST IMPRESSION: 1. Mild left-sided hydronephrosis, with left-sided perinephric stranding and fluid, and an obstructing 6 mm stone noted distally, just proximal to the left  vesicoureteral junction. 2. Scattered nonobstructing left renal stones measure up to 7 mm in size. 3. Mild patchy bibasilar airspace opacities, concerning for pneumonia. Trace right pleural effusion seen. 4. Few small nodes noted at the distal esophagus and at the hepatic hilum, nonspecific in appearance. Hepatic hilum nodes measure up to 1.0 cm in short axis.    Brief H and P: This is a 51 year old gentleman who states that yesterday he developed sudden left flank pain at approximately 5:30 PM. He had nausea and vomiting. He had no evidence of blood in the urine. He eventually came to ER. Patient reports he's had an episode of nephrolithiasis once in the distant past. Additionally, the patient complains of worsening shortness of breath over the past 3 days. He states he also has chest pressure centrally located whenever he climbs stairs. He denies any palpitation, diaphoresis, lower extremity edema. The patient denies any cough, wheezing, chills. The patient does have a history of cardiomyopathy with a EF of 25%. He states his last stress test was approximately 2 years ago, his last heart cath was over 10 years ago. The patient states he's not been taking his medication for approximately 2 weeks as he ran out. In the ER the patient's systolic blood pressure was greater than 200. [facility] was seen [facility], a transfer  was requested for a diagnosis of congestive heart failure and nephrolithiasis. Per ER physician [facility] the patient's oxygen saturation was declining, he initiatesd BiPAP. The patient does have nephrolithiasis, however, he was easily made pain free. During my interview patient was comfortable. History provided by the patient who is alert and oriented.    Hospital Course:   Acute respiratory failure/shortness of breath  Due to uncontrolled hypertension, medical noncompliance. Improved on admission with diuresis and BiPAP. Diuretics were held since due to  worsening of renal function.   Acute on chronic systolic congestive heart failure/ischemic cardiomyopathy with EF of 25-30% with diffuse hypokinesis this in a 2-D echocardiogram on 11/17/2010/coronary artery disease status post RCA stenting in the past  Improved with diuresis with IV Lasix. Since then held his lasix and ACE due to ARF, which was mainly from his L ureteral stone and hydronephrosis, . Will need to followup with cardiology to reestablish care with Dr.Klein after discharge. Clinically compensated, being discharged home on Metoprolol, ASA, Statin, will need to resume ACE , with or without diuretic, based on kidney and clinical status.  Malignant hypertension/Uncontrolled hypertension: resolved  Left nephrolithiasis 6 mm with mild left-sided hydronephrosis with left sided perinephric stranding and fluid with obstructing left renal stones measuring 7 mm  Was seen by Urology in consultation, patient passed the obstructing calculus, Appreciate Urology consult, creatine started improving, peak 2.9, improved to 1.5 at discharge.  Acute renal failure: multifactorial from ? Hydronephrosis, obstructing calculus since passed, lasix/ace: Lasix and lisinopril on held  for 2 days, finally improving with IV fluids, Bmet improved from 2.9 to 1.6 at discharge  Hyperlipidemia  Continue simvastatin for now. Resume pravastatin at discharge.   Type 2 diabetes uncontrolled with complications  Last hemoglobin A1c in August of 2013 was 9.9. Patient only on metformin. Was on lantus inpatient, did not want to start this at discharge immediately, will be discharged home on GLipizide, if creatine continues to improve can add metformin back.  Medical noncompliance/PCP  Patient indicated that he wants to followup with Dr. Etter Sjogren. He was given prescriptions using the match program.      Time spent on Discharge: 72mn  Signed: Ayush Boulet [facility]  06/08/2012, 11:10 AM

## 2012-06-08 NOTE — Progress Notes (Signed)
Heart failure education given, pt able to verbalize monitoring weight daily and keeping record, diet changes and limiting fluid intake and taking medications as prescribed and notifying MD when applicable, and s/s of  Worsening heart failure

## 2012-06-08 NOTE — Progress Notes (Signed)
PT DISCHARGED BY [facility], STATED HE UNDERSTANDS DISCHARGE INSTRUCTIONS, REFUSED TO WAIT FOR MATCH CARD, SPOKE WITH ALYSIA, STATED SHE WILL CALL PT IN REFERENCE TO CARD

## 2012-06-08 NOTE — Progress Notes (Signed)
   CARE MANAGEMENT NOTE 06/08/2012  Patient:  Vincent Chandler, Vincent Chandler   Account Number:  [id number]  Date Initiated:  06/04/2012  Documentation initiated by:  Elissa Hefty  Subjective/Objective Assessment:   adm w htn urgency     Action/Plan:   lives alone, pcp dr Kendrick Fries [place]   Anticipated DC Date:  06/06/2012   Anticipated DC Plan:  [place]  CM consult  [place] [facility]      Choice offered to / List presented to:             Status of service:  Completed, signed off Medicare Important Message given?   (If response is "NO", the following Medicare IM given date fields will be blank) Date Medicare IM given:   Date Additional Medicare IM given:    Discharge Disposition:  HOME/SELF CARE  Per UR Regulation:  Reviewed for med. necessity/level of care/duration of stay  If discussed at [place] of Stay Meetings, dates discussed:    Comments:  06/08/2012 1330 NCM called pt via phone to speak with in regards to his medications. His ride was waiting on him and could not wait for the [facility] letter for his medications. NCM reviewed d/c meds. They are on the [facility] $4.00 list and he could try [employer] or [facility] to shop for cheapest price. Jonnie Finner RN CCM Case Mgmt phone [phone]  06/07/12 1650 Pt. may dc home tomorrow.  Pt. is eligible for the [facility] program to obtain 34days of Free medications. Physician please write for 34day medications.  The weekend Case Manager will print out a card for pt. to take with him to obtain his medications.  Llana Aliment, RN, BSN [place] [phone]    06/05/12 1315 Spoke with pt. about medication assistance. Pt. states that he will be signing up for the Affordable Care insurance on 06/07/12.  Pt. is eligible for the Medication Assistance [facility]) program that would give pt. a card that pt. could take to a [facility] to participating pharmacy and obtain 34days of his medications for free.  Pt. was  interested in this. Physician, please write for 34 day rx on dc. Llana Aliment, RN, BSN NCM [phone]   12/3 8:55a debbie dowell rn,bsn [phone]

## 2012-06-08 NOTE — Progress Notes (Signed)
Resp Care Note: Pt refusing to wear CPAP tonight,states he's just over the whole hospital thing.I educated him on the importance of wearing his CPAP,but he still refuses.

## 2012-06-08 NOTE — Plan of Care (Signed)
Problem: Consults Goal: Nutrition Consult-if indicated Outcome: Completed/Met Date Met:  06/08/12 Completed on 06/07/2012

## 2012-06-10 LAB — GLUCOSE, CAPILLARY: Glucose-Capillary: 148 mg/dL — ABNORMAL HIGH (ref 70–99)

## 2012-06-13 LAB — CULTURE, BLOOD (ROUTINE X 2)

## 2012-06-15 ENCOUNTER — Telehealth: Payer: Self-pay | Admitting: Physician Assistant

## 2012-06-15 NOTE — Telephone Encounter (Signed)
Pt called regarding leg swelling. He was discharged from hospital recently following hospitalization for kidney stones, acute renal failure, and CHF. Lasix and lisinopril were held. His appointment with Dr. Caryl Comes is in 3 days. He has noted the last 2 days bilateral foot/ankle swelling and tightness and was wondering if he should have a prescription called in. I told him unfortunately with his recent Cr up near 3, I would not feel comfortable prescribing medication without evaluating him in person including with updated BMET. I explained it is difficult to eval over the phone since there is also possibility of DVT or worsening renal function with recent hospitalization. He denies CP, SOB or any other symptoms. I told him he can fluid/salt restrict and try compression stockings but if swelling persists he should proceed to ER or urgent care for eval. He declined to go to the ER. He states he knows a few physicians over at [facility] and plans to go there tomorrow AM for evaluation. He will keep appt with Dr. Caryl Comes as well. Renette Hsu PA-C

## 2012-06-18 ENCOUNTER — Ambulatory Visit (INDEPENDENT_AMBULATORY_CARE_PROVIDER_SITE_OTHER): Payer: Self-pay | Admitting: Nurse Practitioner

## 2012-06-18 ENCOUNTER — Encounter: Payer: Self-pay | Admitting: Nurse Practitioner

## 2012-06-18 VITALS — BP 160/94 | HR 96 | Ht 70.0 in | Wt 221.8 lb

## 2012-06-18 DIAGNOSIS — E785 Hyperlipidemia, unspecified: Secondary | ICD-10-CM

## 2012-06-18 DIAGNOSIS — R0600 Dyspnea, unspecified: Secondary | ICD-10-CM

## 2012-06-18 DIAGNOSIS — R0609 Other forms of dyspnea: Secondary | ICD-10-CM

## 2012-06-18 DIAGNOSIS — I255 Ischemic cardiomyopathy: Secondary | ICD-10-CM

## 2012-06-18 DIAGNOSIS — I2589 Other forms of chronic ischemic heart disease: Secondary | ICD-10-CM

## 2012-06-18 DIAGNOSIS — I10 Essential (primary) hypertension: Secondary | ICD-10-CM

## 2012-06-18 LAB — BASIC METABOLIC PANEL
BUN: 18 mg/dL (ref 6–23)
CO2: 28 mEq/L (ref 19–32)
Calcium: 9.1 mg/dL (ref 8.4–10.5)
Chloride: 105 mEq/L (ref 96–112)
Creatinine, Ser: 1.3 mg/dL (ref 0.4–1.5)
GFR: 63 mL/min (ref >60.00)
Glucose, Bld: 176 mg/dL — ABNORMAL HIGH (ref 70–99)
Potassium: 4.2 mEq/L (ref 3.5–5.1)
Sodium: 140 mEq/L (ref 135–145)

## 2012-06-18 LAB — BRAIN NATRIURETIC PEPTIDE: Pro B Natriuretic peptide (BNP): 432 pg/mL — ABNORMAL HIGH (ref 0.0–100.0)

## 2012-06-18 MED ORDER — PRAVASTATIN SODIUM 40 MG PO TABS: 40.0000 mg | ORAL_TABLET | Freq: Every day | ORAL | Status: DC

## 2012-06-18 MED ORDER — METOPROLOL TARTRATE 50 MG PO TABS: 50.0000 mg | ORAL_TABLET | Freq: Two times a day (BID) | ORAL | Status: DC

## 2012-06-18 NOTE — Progress Notes (Signed)
Vincent Chandler Date of Birth: 07-15-1960 Medical Record #[id number]  History of Present Illness: Vincent Chandler is seen back today for a post hospital visit. He is seen for Dr. Caryl Comes. He has not been here since June of 2012. He has an ischemic CM. EF was down to 35% last year. Had an abnormal Myoview last year with large inferior scar and no ischemia. Lost to follow up.   Most recently was in the hospital with a kidney stone and had renal failure associated. He passed the stone.   He comes in today. He is here alone. He says he is ready to get back on track with his health. He was feeling short of breath and having some chest heaviness prior to this recent hospitalization. Had some increased swelling. He is wearing support stockings and has had improvement in his swelling. No scales. Was using excessive salt but has now cut back.  He is now feeling better. Ready to get back on his medicines. He has signed up for the exchanges and thinks he will have insurance on January 1st. He is seeing his PCP later this week to get back on his insulin.   Current Outpatient Prescriptions on File Prior to Visit  Medication Sig Dispense Refill  . aspirin EC 81 MG tablet Take 81 mg by mouth daily.      . pravastatin (PRAVACHOL) 40 MG tablet Take 1 tablet (40 mg total) by mouth daily.  34 tablet  0    No Known Allergies  Past Medical History  Diagnosis Date  . Coronary artery disease     drug eluting sten RCA 2005-EF 30% at that time  . Cardiomyopathy     alcohol use related  . Asthma   . Hypertension   . Urinary incontinence   . Hyperlipidemia   . History of hypogonadism   . Pneumonia 02/16/2012    "today makes third time in my life"  . OSA on CPAP 02/16/2012    "wear mask sometimes"  . Diabetes mellitus type II   . H/O hiatal hernia   . GERD (gastroesophageal reflux disease)   . Migraines 02/16/2012    "used to"  . Anxiety     Past Surgical History  Procedure Date  . Tonsillectomy ~ 1970  .  Coronary angioplasty with stent placement ~ 2003  . Refractive surgery 1990's    bilaterally    History  Smoking status  . Never Smoker   Smokeless tobacco  . Current User  . Types: Chew    Comment: 02/16/2012 smoking cessation materials offered; pt declines    History  Alcohol Use  . Yes    Family History  Problem Relation Age of Onset  . Hypertension Mother   . Diabetes Mother   . Hypertension Father   . Diabetes Father   . Hyperlipidemia Father     Review of Systems: The review of systems is per the HPI.  All other systems were reviewed and are negative.  Physical Exam: BP 160/94  Pulse 96  Ht _0  (1.778 m)  Wt 221 lb 12.8 oz (100.608 kg)  BMI 31.83 kg/m2 Patient is very pleasant and in no acute distress. Skin is warm and dry. Color is normal.  HEENT is unremarkable. Normocephalic/atraumatic. PERRL. Sclera are nonicteric. Neck is supple. No masses. No JVD. Lungs are clear. Cardiac exam shows a regular rate and rhythm. +S3 noted. Abdomen is soft. Extremities are with trace edema. He has support stockings in place. Gait and  ROM are intact. No gross neurologic deficits noted.  LABORATORY DATA: PENDING FOR TODAY  Lab Results  Component Value Date   WBC 10.2 06/07/2012   HGB 15.3 06/07/2012   HCT 45.4 06/07/2012   PLT 210 06/07/2012   GLUCOSE 188* 06/08/2012   CHOL 173 06/04/2012   TRIG 110 06/04/2012   HDL 32* 06/04/2012   LDLDIRECT 153.1 11/03/2010   LDLCALC 119* 06/04/2012   ALT 26 06/03/2012   AST 22 06/03/2012   NA 135 06/08/2012   K 4.2 06/08/2012   CL 104 06/08/2012   CREATININE 1.53* 06/08/2012   BUN 44* 06/08/2012   CO2 21 06/08/2012   TSH 0.85 10/18/2010   PSA 0.24 03/10/2010   HGBA1C 8.9* 06/05/2012   MICROALBUR 5.0* 03/10/2010   Myoview Impression from April 2012 Exercise Capacity: Lexiscan with no exercise.  BP Response: Normal blood pressure response.  Clinical Symptoms: SOB  ECG Impression: No significant ST segment change suggestive of ischemia.    Comparison with Prior Nuclear Study: The findings of the infero-lateral infarct are new since the study of 2005.   Overall Impression: The is a large inferlateral scar with no ischemia.   Dola Argyle  Please Inform Patient taht study shows a new heart attack since 2005 probably in the region of where is stent is. We need to do an echo to assess lV function as nuc was not gated and depending on those results we may need to think in terms of catheterization esp if he continues to have chest pain  Thanks   Echo Study Conclusions from May 2012  - Left ventricle: The cavity size was normal. Wall thickness was increased in a pattern of mild LVH. Systolic function was severely reduced. The estimated ejection fraction was in the range of 25% to 30%. Diffuse hypokinesis. There is akinesis of the inferoposterior myocardium. Doppler parameters are consistent with abnormal left ventricular relaxation (grade 1 diastolic dysfunction). - Left atrium: The atrium was mildly dilated.  Assessment / Plan:  1. Ischemic CM - Last EF was 25%. He says he is ready to get back on track with his medicines and lifestyle. We will recheck BMET today. Hope to restart his ACE in the am. If his renal function has not returned to normal, will start isordil and hydralazine. Will need a repeat echo. He is not able to schedule until his insurance kicks in. I have approached the idea of an ICD with him.   2. CAD - abnormal Myoview from last year. Has had some recurrent chest pressure. May need to repeat his cath after the first of the year as well.  3. HTN - BP not controlled. Will start either ACE or hydralazine/isordil tomorrow.  4. Recent kidney stone - recheck BMET today.   5. DM - seeing his PCP later this week to get back on insulin.   6. Non compliance - he seems motivated to get back on track with his medicines and lifestyle changes.   Patient is agreeable to this plan and will call if any problems develop in  the interim.

## 2012-06-18 NOTE — Patient Instructions (Addendum)
I have refilled your metoprolol and your Pravachol - pick this up tomorrow because there will be a 3rd medicine there tomorrow.   We are going to check some labs today. Based on this lab, we will start you back on either Lisinopril 5 mg a day or we will use Hydralazine and Isordil.  I need to see you in 2 weeks  We need to plan on a repeat cath and ultrasound of your heart after the first of the year  Avoid salt  Try to get some scales to weigh each morning.   2 Gram Low Sodium Diet A 2 gram sodium diet restricts the amount of sodium in the diet to no more than 2 g or 2000 mg daily. Limiting the amount of sodium is often used to help lower blood pressure. It is important if you have heart, liver, or kidney problems. Many foods contain sodium for flavor and sometimes as a preservative. When the amount of sodium in a diet needs to be low, it is important to know what to look for when choosing foods and drinks. The following includes some information and guidelines to help make it easier for you to adapt to a low sodium diet. QUICK TIPS  Do not add salt to food.  Avoid convenience items and fast food.  Choose unsalted snack foods.  Buy lower sodium products, often labeled as "lower sodium" or "no salt added."  Check food labels to learn how much sodium is in 1 serving.  When eating at a restaurant, ask that your food be prepared with less salt or none, if possible. READING FOOD LABELS FOR SODIUM INFORMATION The nutrition facts label is a good place to find how much sodium is in foods. Look for products with no more than 500 to 600 mg of sodium per meal and no more than 150 mg per serving. Remember that 2 g = 2000 mg. The food label may also list foods as:  Sodium-free: Less than 5 mg in a serving.  Very low sodium: 35 mg or less in a serving.  Low-sodium: 140 mg or less in a serving.  Light in sodium: 50% less sodium in a serving. For example, if a food that usually has 300 mg of  sodium is changed to become light in sodium, it will have 150 mg of sodium.  Reduced sodium: 25% less sodium in a serving. For example, if a food that usually has 400 mg of sodium is changed to reduced sodium, it will have 300 mg of sodium. CHOOSING FOODS Grains  Avoid: Salted crackers and snack items. Some cereals, including instant hot cereals. Bread stuffing and biscuit mixes. Seasoned rice or pasta mixes.  Choose: Unsalted snack items. Low-sodium cereals, oats, puffed wheat and rice, shredded wheat. English muffins and bread. Pasta. Meats  Avoid: Salted, canned, smoked, spiced, pickled meats, including fish and poultry. Bacon, ham, sausage, cold cuts, hot dogs, anchovies.  Choose: Low-sodium canned tuna and salmon. Fresh or frozen meat, poultry, and fish. Dairy  Avoid: Processed cheese and spreads. Cottage cheese. Buttermilk and condensed milk. Regular cheese.  Choose: Milk. Low-sodium cottage cheese. Yogurt. Sour cream. Low-sodium cheese. Fruits and Vegetables  Avoid: Regular canned vegetables. Regular canned tomato sauce and paste. Frozen vegetables in sauces. Olives. Angie Fava. Relishes. Sauerkraut.  Choose: Low-sodium canned vegetables. Low-sodium tomato sauce and paste. Frozen or fresh vegetables. Fresh and frozen fruit. Condiments  Avoid: Canned and packaged gravies. Worcestershire sauce. Tartar sauce. Barbecue sauce. Soy sauce. Steak sauce. Ketchup. Onion,  garlic, and table salt. Meat flavorings and tenderizers.  Choose: Fresh and dried herbs and spices. Low-sodium varieties of mustard and ketchup. Lemon juice. Tabasco sauce. Horseradish. SAMPLE 2 GRAM SODIUM MEAL PLAN Breakfast / Sodium (mg)  1 cup low-fat milk / 029 mg  2 slices whole-wheat toast / 270 mg  1 tbs heart-healthy margarine / 153 mg  1 hard-boiled egg / 139 mg  1 small orange / 0 mg Lunch / Sodium (mg)  1 cup raw carrots / 76 mg   cup hummus / 298 mg  1 cup low-fat milk / 143 mg   cup red  grapes / 2 mg  1 whole-wheat pita bread / 356 mg Dinner / Sodium (mg)  1 cup whole-wheat pasta / 2 mg  1 cup low-sodium tomato sauce / 73 mg  3 oz lean ground beef / 57 mg  1 small side salad (1 cup raw spinach leaves,  cup cucumber,  cup yellow bell pepper) with 1 tsp olive oil and 1 tsp red wine vinegar / 25 mg Snack / Sodium (mg)  1 container low-fat vanilla yogurt / 107 mg  3 graham cracker squares / 127 mg Nutrient Analysis  Calories: 2033  Protein: 77 g  Carbohydrate: 282 g  Fat: 72 g  Sodium: 1971 mg Document Released: 06/19/2005 Document Revised: 09/11/2011 Document Reviewed: 09/20/2009 [facility] Patient Information 2013 [place].

## 2012-06-19 ENCOUNTER — Other Ambulatory Visit: Payer: Self-pay | Admitting: *Deleted

## 2012-06-19 DIAGNOSIS — E785 Hyperlipidemia, unspecified: Secondary | ICD-10-CM

## 2012-06-19 DIAGNOSIS — I10 Essential (primary) hypertension: Secondary | ICD-10-CM

## 2012-06-19 DIAGNOSIS — R0600 Dyspnea, unspecified: Secondary | ICD-10-CM

## 2012-06-19 MED ORDER — PRAVASTATIN SODIUM 40 MG PO TABS: 40.0000 mg | ORAL_TABLET | Freq: Every day | ORAL | Status: DC

## 2012-06-19 MED ORDER — LISINOPRIL 10 MG PO TABS: 10.0000 mg | ORAL_TABLET | Freq: Every day | ORAL | Status: DC

## 2012-06-19 MED ORDER — METOPROLOL TARTRATE 50 MG PO TABS: 50.0000 mg | ORAL_TABLET | Freq: Two times a day (BID) | ORAL | Status: DC

## 2012-06-21 ENCOUNTER — Ambulatory Visit (INDEPENDENT_AMBULATORY_CARE_PROVIDER_SITE_OTHER): Payer: Self-pay | Admitting: Family Medicine

## 2012-06-21 ENCOUNTER — Encounter: Payer: Self-pay | Admitting: Family Medicine

## 2012-06-21 VITALS — BP 144/90 | HR 84 | Temp 98.2°F | Wt 222.4 lb

## 2012-06-21 DIAGNOSIS — I10 Essential (primary) hypertension: Secondary | ICD-10-CM

## 2012-06-21 DIAGNOSIS — E785 Hyperlipidemia, unspecified: Secondary | ICD-10-CM

## 2012-06-21 DIAGNOSIS — E119 Type 2 diabetes mellitus without complications: Secondary | ICD-10-CM

## 2012-06-21 MED ORDER — METFORMIN HCL ER 500 MG PO TB24: 1000.0000 mg | ORAL_TABLET | Freq: Every day | ORAL | Status: DC

## 2012-06-21 NOTE — Patient Instructions (Addendum)
Diabetes and Standards of Medical Care  Diabetes is complicated. You may find that your diabetes team includes a dietitian, nurse, diabetes educator, eye doctor, and more. To help everyone know what is going on and to help you get the care you deserve, the following schedule of care was developed to help keep you on track. Below are the tests, exams, vaccines, medicines, education, and plans you will need. A1c test  Performed at least 2 times a year if you are meeting treatment goals.  Performed 4 times a year if therapy has changed or if you are not meeting therapy/glycemic goals. Aspirin medicine  Take daily as directed by your caregiver. Blood pressure test  Performed at every routine medical visit. The goal is less than 130/80 mm/Hg. Dental exam  Get a dental exam at least 2 times a year. Dilated eye exam (retinal exam)  Type 1 diabetes: Get an exam within 5 years of diagnosis and then yearly.  Type 2 diabetes: Get an exam at diagnosis and then yearly. All exams thereafter can be extended to every 2 to 3 years if one or more exams have been normal. Foot care exam  Visual foot exams are performed at every routine medical visit. The exams check for cuts, injuries, or other problems with the feet.  A comprehensive foot exam should be done yearly. This includes visual inspection as well as assessing foot pulses and testing for loss of sensation. Kidney function test (urine microalbumin)  Performed once a year.  Type 1 diabetes: The first test is performed 5 years after diagnosis.  Type 2 diabetes: The first test is performed at the time of diagnosis.  A serum creatinine and estimated glomerular filtration rate (eGFR) test is done once a year to tell the level of chronic kidney disease (CKD), if present. Lipid profile (Cholesterol, HDL, LDL, Triglycerides)  Performed once a year for most people. If at low risk, may be assessed every 2 years.  The goal for LDL is less than 100  mg/dl. If at high risk, the goal is less than 70 mg/dl.  The goal for HDL is higher than 40 mg/dl for men and higher than 50 mg/dl for women.  The goal for triglycerides is less than 150 mg/dl. Flu vaccine, pneumonia vaccine, and hepatitis B vaccine  The flu vaccine is recommended yearly.  The pneumonia vaccine is generally given once in a lifetime. However, there are some instances where another vaccine is recommended. Check with your caregiver.  The hepatitis B vaccine is also recommended for adults with diabetes. Diabetes self-management education  Recommended at diagnosis and ongoing as needed. Treatment plan  Reviewed at every medical visit. Document Released: 04/16/2009 Document Revised: 09/11/2011 Document Reviewed: 12/20/2010 [facility] Patient Information 2013 [place]. Diabetes Meal Planning Guide The diabetes meal planning guide is a tool to help you plan your meals and snacks. It is important for people with diabetes to manage their blood glucose (sugar) levels. Choosing the right foods and the right amounts throughout your day will help control your blood glucose. Eating right can even help you improve your blood pressure and reach or maintain a healthy weight. CARBOHYDRATE COUNTING MADE EASY When you eat carbohydrates, they turn to sugar. This raises your blood glucose level. Counting carbohydrates can help you control this level so you feel better. When you plan your meals by counting carbohydrates, you can have more flexibility in what you eat and balance your medicine with your food intake. Carbohydrate counting simply means adding  up the total amount of carbohydrate grams in your meals and snacks. Try to eat about the same amount at each meal. Foods with carbohydrates are listed below. Each portion below is 1 carbohydrate serving or 15 grams of carbohydrates. Ask your dietician how many grams of carbohydrates you should eat at each meal or snack. Grains and  Starches  1 slice bread.   English muffin or hotdog/hamburger bun.   cup cold cereal (unsweetened).   cup cooked pasta or rice.   cup starchy vegetables (corn, potatoes, peas, beans, winter squash).  1 tortilla (6 inches).   bagel.  1 waffle or pancake (size of a CD).   cup cooked cereal.  4 to 6 small crackers. *Whole grain is recommended. Fruit  1 cup fresh unsweetened berries, melon, papaya, pineapple.  1 small fresh fruit.   banana or mango.   cup fruit juice (4 oz unsweetened).   cup canned fruit in natural juice or water.  2 tbs dried fruit.  12 to 15 grapes or cherries. Milk and Yogurt  1 cup fat-free or 1% milk.  1 cup soy milk.  6 oz light yogurt with sugar-free sweetener.  6 oz low-fat soy yogurt.  6 oz plain yogurt. Vegetables  1 cup raw or  cup cooked is counted as 0 carbohydrates or a "free" food.  If you eat 3 or more servings at 1 meal, count them as 1 carbohydrate serving. Other Carbohydrates   oz chips or pretzels.   cup ice cream or frozen yogurt.   cup sherbet or sorbet.  2 inch square cake, no frosting.  1 tbs honey, sugar, jam, jelly, or syrup.  2 small cookies.  3 squares of graham crackers.  3 cups popcorn.  6 crackers.  1 cup broth-based soup.  Count 1 cup casserole or other mixed foods as 2 carbohydrate servings.  Foods with less than 20 calories in a serving may be counted as 0 carbohydrates or a "free" food. You may want to purchase a book or computer software that lists the carbohydrate gram counts of different foods. In addition, the nutrition facts panel on the labels of the foods you eat are a good source of this information. The label will tell you how big the serving size is and the total number of carbohydrate grams you will be eating per serving. Divide this number by 15 to obtain the number of carbohydrate servings in a portion. Remember, 1 carbohydrate serving equals 15 grams of  carbohydrate. SERVING SIZES Measuring foods and serving sizes helps you make sure you are getting the right amount of food. The list below tells how big or small some common serving sizes are.  1 oz.........4 stacked dice.  3 oz........Marland KitchenDeck of cards.  1 tsp.......Marland KitchenTip of little finger.  1 tbs......Marland KitchenMarland KitchenThumb.  2 tbs.......Marland KitchenGolf ball.   cup......Marland KitchenHalf of a fist.  1 cup.......Marland KitchenA fist. SAMPLE DIABETES MEAL PLAN Below is a sample meal plan that includes foods from the grain and starches, dairy, vegetable, fruit, and meat groups. A dietician can individualize a meal plan to fit your calorie needs and tell you the number of servings needed from each food group. However, controlling the total amount of carbohydrates in your meal or snack is more important than making sure you include all of the food groups at every meal. You may interchange carbohydrate containing foods (dairy, starches, and fruits). The meal plan below is an example of a 2000 calorie diet using carbohydrate counting. This meal plan has 17 carbohydrate  servings. Breakfast  1 cup oatmeal (2 carb servings).   cup light yogurt (1 carb serving).  1 cup blueberries (1 carb serving).   cup almonds. Snack  1 large apple (2 carb servings).  1 low-fat string cheese stick. Lunch  Chicken breast salad.  1 cup spinach.   cup chopped tomatoes.  2 oz chicken breast, sliced.  2 tbs low-fat [place] dressing.  12 whole-wheat crackers (2 carb servings).  12 to 15 grapes (1 carb serving).  1 cup low-fat milk (1 carb serving). Snack  1 cup carrots.   cup hummus (1 carb serving). Dinner  3 oz broiled salmon.  1 cup brown rice (3 carb servings). Snack  1  cups steamed broccoli (1 carb serving) drizzled with 1 tsp olive oil and lemon juice.  1 cup light pudding (2 carb servings). DIABETES MEAL PLANNING WORKSHEET Your dietician can use this worksheet to help you decide how many servings of foods and what types  of foods are right for you.  BREAKFAST Food Group and Servings / Carb Servings Grain/Starches __________________________________ Dairy __________________________________________ Vegetable ______________________________________ Fruit ___________________________________________ Meat __________________________________________ Fat ____________________________________________ LUNCH Food Group and Servings / Carb Servings Grain/Starches ___________________________________ Dairy ___________________________________________ Fruit ____________________________________________ Meat ___________________________________________ Fat _____________________________________________ Vincent Chandler Food Group and Servings / Carb Servings Grain/Starches ___________________________________ Dairy ___________________________________________ Fruit ____________________________________________ Meat ___________________________________________ Fat _____________________________________________ SNACKS Food Group and Servings / Carb Servings Grain/Starches ___________________________________ Dairy ___________________________________________ Vegetable _______________________________________ Fruit ____________________________________________ Meat ___________________________________________ Fat _____________________________________________ DAILY TOTALS Starches _________________________ Vegetable ________________________ Fruit ____________________________ Dairy ____________________________ Meat ____________________________ Fat ______________________________ Document Released: 03/16/2005 Document Revised: 09/11/2011 Document Reviewed: 01/25/2009 [employer] Patient Information 2013 [place].

## 2012-06-22 NOTE — Assessment & Plan Note (Signed)
Stable con't meds 

## 2012-06-22 NOTE — Assessment & Plan Note (Signed)
con't meds Per cardiology

## 2012-06-22 NOTE — Assessment & Plan Note (Signed)
Pt given new meter Restart metformin Refer to nutrition Drop off or call in glucose readings

## 2012-06-22 NOTE — Progress Notes (Signed)
  Subjective:    Patient ID: Vincent Chandler, male    DOB: [birth year], 51 y.o.   MRN: [id number]  HPI HYPERTENSION Disease Monitoring Blood pressure range-not checking at home Chest pain- no      Dyspnea- no Medications Compliance- good since d/c from hospital Lightheadedness- no   Edema- no   DIABETES Disease Monitoring Blood Sugar ranges-elevated Polyuria- no New Visual problems- no Medications Compliance- poor Hypoglycemic symptoms- no   HYPERLIPIDEMIA Disease Monitoring See symptoms for Hypertension Medications Compliance- good since d/c from hosp RUQ pain- no  Muscle aches- no  ROS See HPI above   PMH Smoking Status noted     Review of Systems As above    Objective:   Physical Exam BP 144/90  Pulse 84  Temp 98.2 F (36.8 C) (Oral)  Wt 222 lb 6.4 oz (100.88 kg)  SpO2 97% General appearance: alert, cooperative, appears stated age and no distress Lungs: clear to auscultation bilaterally Heart: S1, S2 normal Extremities: extremities normal, atraumatic, no cyanosis or edema Sensory exam of the foot is normal, tested with the monofilament. Good pulses, no lesions or ulcers, good peripheral pulses.        Assessment & Plan:

## 2012-07-02 ENCOUNTER — Ambulatory Visit: Payer: Self-pay | Admitting: Nurse Practitioner

## 2012-07-10 ENCOUNTER — Ambulatory Visit (INDEPENDENT_AMBULATORY_CARE_PROVIDER_SITE_OTHER): Payer: Self-pay | Admitting: Nurse Practitioner

## 2012-07-10 ENCOUNTER — Encounter: Payer: Self-pay | Admitting: Nurse Practitioner

## 2012-07-10 ENCOUNTER — Telehealth: Payer: Self-pay | Admitting: Internal Medicine

## 2012-07-10 VITALS — BP 146/96 | HR 76 | Ht 70.0 in | Wt 223.8 lb

## 2012-07-10 DIAGNOSIS — I10 Essential (primary) hypertension: Secondary | ICD-10-CM

## 2012-07-10 DIAGNOSIS — R0600 Dyspnea, unspecified: Secondary | ICD-10-CM

## 2012-07-10 DIAGNOSIS — I509 Heart failure, unspecified: Secondary | ICD-10-CM

## 2012-07-10 DIAGNOSIS — E785 Hyperlipidemia, unspecified: Secondary | ICD-10-CM

## 2012-07-10 DIAGNOSIS — I5022 Chronic systolic (congestive) heart failure: Secondary | ICD-10-CM

## 2012-07-10 DIAGNOSIS — R0989 Other specified symptoms and signs involving the circulatory and respiratory systems: Secondary | ICD-10-CM

## 2012-07-10 DIAGNOSIS — R0609 Other forms of dyspnea: Secondary | ICD-10-CM

## 2012-07-10 LAB — BASIC METABOLIC PANEL
BUN: 20 mg/dL (ref 6–23)
CO2: 30 mEq/L (ref 19–32)
Calcium: 9.4 mg/dL (ref 8.4–10.5)
Chloride: 105 mEq/L (ref 96–112)
Creatinine, Ser: 1.3 mg/dL (ref 0.4–1.5)
GFR: 62.42 mL/min (ref >60.00)
Glucose, Bld: 144 mg/dL — ABNORMAL HIGH (ref 70–99)
Potassium: 3.9 mEq/L (ref 3.5–5.1)
Sodium: 140 mEq/L (ref 135–145)

## 2012-07-10 LAB — CBC WITH DIFFERENTIAL/PLATELET
Basophils Absolute: 0 10*3/uL (ref 0.0–0.1)
Basophils Relative: 0.1 % (ref 0.0–3.0)
Eosinophils Absolute: 0.3 10*3/uL (ref 0.0–0.7)
Eosinophils Relative: 3.4 % (ref 0.0–5.0)
HCT: 47 % (ref 39.0–52.0)
Hemoglobin: 15.8 g/dL (ref 13.0–17.0)
Lymphocytes Relative: 26.9 % (ref 12.0–46.0)
Lymphs Abs: 2 10*3/uL (ref 0.7–4.0)
MCHC: 33.5 g/dL (ref 30.0–36.0)
MCV: 97.8 fl (ref 78.0–100.0)
Monocytes Absolute: 0.8 10*3/uL (ref 0.1–1.0)
Monocytes Relative: 10.3 % (ref 3.0–12.0)
Neutro Abs: 4.5 10*3/uL (ref 1.4–7.7)
Neutrophils Relative %: 59.3 % (ref 43.0–77.0)
Platelets: 198 10*3/uL (ref 150.0–400.0)
RBC: 4.81 Mil/uL (ref 4.22–5.81)
RDW: 14.7 % — ABNORMAL HIGH (ref 11.5–14.6)
WBC: 7.6 10*3/uL (ref 4.5–10.5)

## 2012-07-10 LAB — PROTIME-INR
INR: 1 ratio (ref 0.8–1.0)
Prothrombin Time: 10.8 s (ref 10.2–12.4)

## 2012-07-10 LAB — APTT: aPTT: 27.6 s (ref 21.7–28.8)

## 2012-07-10 MED ORDER — LISINOPRIL 20 MG PO TABS: 20.0000 mg | ORAL_TABLET | Freq: Every day | ORAL | Status: DC

## 2012-07-10 MED ORDER — METOPROLOL TARTRATE 50 MG PO TABS: 50.0000 mg | ORAL_TABLET | Freq: Two times a day (BID) | ORAL | Status: DC

## 2012-07-10 NOTE — Progress Notes (Signed)
Vincent Chandler Date of Birth: Sep 04, 1960 Medical Record #[id number]  History of Present Illness: Vincent Chandler is seen today for a 3 week check. He is seen for Dr. Caryl Comes. He has an ischemic CM with an EF down to 35% last year. Had an abnormal Myoview last year with large inferior scar and no ischemia. Was lost to follow up. Recently in the hospital with a kidney stone and had associated renal failure.   Seen last month by me. Wanting to get back on track with his health. Was feeling better clinically with less heart failure symptoms. Was to have gotten back on his insulin. Now back on medicines. Was able to sign up for the exchanges for his health insurance the first of this year.   He comes in today.  He is here alone. He is doing ok. Feels better. Less short of breath. Using less salt. No real chest pain. Overall seems a bit better. Still waiting on his insurance card. Was to have signed up for the "Silver" program but still with no card and not found in the Vincent Chandler on line system.   Current Outpatient Prescriptions on File Prior to Visit  Medication Sig Dispense Refill  . aspirin EC 81 MG tablet Take 81 mg by mouth daily.      Marland Kitchen lisinopril (PRINIVIL,ZESTRIL) 10 MG tablet Take 1 tablet (10 mg total) by mouth daily.  30 tablet  6  . metFORMIN (GLUCOPHAGE-XR) 500 MG 24 hr tablet Take 2 tablets (1,000 mg total) by mouth daily with breakfast.  60 tablet  2  . metoprolol (LOPRESSOR) 50 MG tablet Take 1 tablet (50 mg total) by mouth 2 (two) times daily.  60 tablet  6  . pravastatin (PRAVACHOL) 40 MG tablet Take 1 tablet (40 mg total) by mouth daily.  30 tablet  6    No Known Allergies  Past Medical History  Diagnosis Date  . Coronary artery disease     drug eluting sten RCA 2005-EF 30% at that time  . Cardiomyopathy     alcohol use related  . Asthma   . Hypertension   . Urinary incontinence   . Hyperlipidemia   . History of hypogonadism   . Pneumonia 02/16/2012    "today makes  third time in my life"  . OSA on CPAP 02/16/2012    "wear mask sometimes"  . Diabetes mellitus type II   . H/O hiatal hernia   . GERD (gastroesophageal reflux disease)   . Migraines 02/16/2012    "used to"  . Anxiety     Past Surgical History  Procedure Date  . Tonsillectomy ~ 1970  . Coronary angioplasty with stent placement ~ 2003  . Refractive surgery 1990's    bilaterally    History  Smoking status  . Never Smoker   Smokeless tobacco  . Current User  . Types: Chew    Comment: 02/16/2012 smoking cessation materials offered; pt declines    History  Alcohol Use  . Yes    Family History  Problem Relation Age of Onset  . Hypertension Mother   . Diabetes Mother   . Hypertension Father   . Diabetes Father   . Hyperlipidemia Father     Review of Systems: The review of systems is per the HPI.  All other systems were reviewed and are negative.  Physical Exam: Ht _0  (1.778 m)  Wt 223 lb 12.8 oz (101.515 kg)  BMI 32.11 kg/m2 Patient is very pleasant  and in no acute distress. Skin is warm and dry. Color is normal.  HEENT is unremarkable. Normocephalic/atraumatic. PERRL. Sclera are nonicteric. Neck is supple. No masses. No JVD. Lungs are clear. Cardiac exam shows a regular rate and rhythm. Abdomen is soft. Extremities are without edema. Gait and ROM are intact. No gross neurologic deficits noted.   LABORATORY DATA: Pending  Lab Results  Component Value Date   WBC 10.2 06/07/2012   HGB 15.3 06/07/2012   HCT 45.4 06/07/2012   PLT 210 06/07/2012   GLUCOSE 176* 06/18/2012   CHOL 173 06/04/2012   TRIG 110 06/04/2012   HDL 32* 06/04/2012   LDLDIRECT 153.1 11/03/2010   LDLCALC 119* 06/04/2012   ALT 26 06/03/2012   AST 22 06/03/2012   NA 140 06/18/2012   K 4.2 06/18/2012   CL 105 06/18/2012   CREATININE 1.3 06/18/2012   BUN 18 06/18/2012   CO2 28 06/18/2012   TSH 0.85 10/18/2010   PSA 0.24 03/10/2010   HGBA1C 8.9* 06/05/2012   MICROALBUR 5.0* 03/10/2010   Myoview  Impression from April 2012  Exercise Capacity: Lexiscan with no exercise.  BP Response: Normal blood pressure response.  Clinical Symptoms: SOB  ECG Impression: No significant ST segment change suggestive of ischemia.  Comparison with Prior Nuclear Study: The findings of the infero-lateral infarct are new since the study of 2005.  Overall Impression: The is a large inferlateral scar with no ischemia.  Vincent Chandler  Please Inform Patient that study shows a new heart attack since 2005 probably in the region of where is stent is. We need to do an echo to assess lV function as nuc was not gated and depending on those results we may need to think in terms of catheterization esp if he continues to have chest pain   Echo Study Conclusions from May 2012  - Left ventricle: The cavity size was normal. Wall thickness was increased in a pattern of mild LVH. Systolic function was severely reduced. The estimated ejection fraction was in the range of 25% to 30%. Diffuse hypokinesis. There is akinesis of the inferoposterior myocardium. Doppler parameters are consistent with abnormal left ventricular relaxation (grade 1 diastolic dysfunction). - Left atrium: The atrium was mildly dilated.  Assessment / Plan: 1. Ischemic CM -  EF is 25%. Back on ACE. Only taking his metoprolol once a day. Needs repeat echo and cath to rule out progression of coronary disease, especially in light of his Myoview findings from 2012.  2. CAD - needs cath - still with no insurance card. Will hold on scheduling. He is going to call us with an ID number. If he truly has insurance from [employer], will arrange for LCP in the [place] lab.  3. HTN - Lisinopril is increased to 20 mg. Lopressor will be BID.   I will see him back in about 2 weeks. Checking all of his labs today. Will await his call about insurance coverage before proceeding on with cath. Patient is agreeable to this plan and will call if any problems develop in the  interim.

## 2012-07-10 NOTE — Telephone Encounter (Signed)
New Problem:    Patient called in to let you know his [place] [phone].

## 2012-07-10 NOTE — Patient Instructions (Addendum)
Stay on your current medicines but we are increasing your Lisinopril to 20 mg a day. Take your Metoprolol 2 times a day  We need to arrange for a heart catheterization and an ultrasound of your heart once we get your insurance information  Weigh yourself each morning and record.  Take extra dose of diuretic for weight gain of 3 pounds in 24 hours.   Limit sodium intake. Goal is to have less than 2000 mg (2gm) of salt per day.  Call the [facility] office at [phone] if you have any questions, problems or concerns.

## 2012-07-12 ENCOUNTER — Telehealth: Payer: Self-pay | Admitting: Nurse Practitioner

## 2012-07-12 NOTE — Telephone Encounter (Signed)
Pt needs to talk with danielle re echo

## 2012-07-12 NOTE — Telephone Encounter (Signed)
t/w pt about ins will call pt on 07/15/12 and if we haven't received ins. confirmation will cancel echo and discuss further at his ov w/ Cecille Rubin on 07/24/12

## 2012-07-15 NOTE — Telephone Encounter (Signed)
Follow-up:    Patient called returning your call about his insurance.  Please call back.

## 2012-07-15 NOTE — Telephone Encounter (Signed)
t/w pt advised pt on cancelled echo waiting till insurance goes through will discuss further at his ov on 07/24/12

## 2012-07-17 ENCOUNTER — Telehealth: Payer: Self-pay | Admitting: *Deleted

## 2012-07-17 ENCOUNTER — Other Ambulatory Visit (HOSPITAL_COMMUNITY): Payer: Self-pay

## 2012-07-17 ENCOUNTER — Ambulatory Visit: Payer: Self-pay | Admitting: Dietician

## 2012-07-17 NOTE — Telephone Encounter (Signed)
t/w pt and scheduled his echo 07/24/12 pt agreed to plan

## 2012-07-17 NOTE — Telephone Encounter (Signed)
t/w pt and let him know his new ins went through

## 2012-07-24 ENCOUNTER — Ambulatory Visit (HOSPITAL_COMMUNITY): Payer: BC Managed Care – PPO | Attending: Cardiovascular Disease

## 2012-07-24 ENCOUNTER — Encounter: Payer: Self-pay | Admitting: Nurse Practitioner

## 2012-07-24 ENCOUNTER — Other Ambulatory Visit: Payer: Self-pay | Admitting: Nurse Practitioner

## 2012-07-24 ENCOUNTER — Ambulatory Visit (INDEPENDENT_AMBULATORY_CARE_PROVIDER_SITE_OTHER): Payer: BC Managed Care – PPO | Admitting: Nurse Practitioner

## 2012-07-24 VITALS — BP 147/90 | HR 64 | Ht 70.0 in | Wt 222.0 lb

## 2012-07-24 DIAGNOSIS — I379 Nonrheumatic pulmonary valve disorder, unspecified: Secondary | ICD-10-CM | POA: Insufficient documentation

## 2012-07-24 DIAGNOSIS — I5022 Chronic systolic (congestive) heart failure: Secondary | ICD-10-CM

## 2012-07-24 DIAGNOSIS — I255 Ischemic cardiomyopathy: Secondary | ICD-10-CM

## 2012-07-24 DIAGNOSIS — I080 Rheumatic disorders of both mitral and aortic valves: Secondary | ICD-10-CM | POA: Insufficient documentation

## 2012-07-24 DIAGNOSIS — I2589 Other forms of chronic ischemic heart disease: Secondary | ICD-10-CM

## 2012-07-24 DIAGNOSIS — R079 Chest pain, unspecified: Secondary | ICD-10-CM

## 2012-07-24 DIAGNOSIS — I509 Heart failure, unspecified: Secondary | ICD-10-CM | POA: Insufficient documentation

## 2012-07-24 DIAGNOSIS — I2510 Atherosclerotic heart disease of native coronary artery without angina pectoris: Secondary | ICD-10-CM | POA: Insufficient documentation

## 2012-07-24 DIAGNOSIS — I10 Essential (primary) hypertension: Secondary | ICD-10-CM | POA: Insufficient documentation

## 2012-07-24 DIAGNOSIS — R0989 Other specified symptoms and signs involving the circulatory and respiratory systems: Secondary | ICD-10-CM

## 2012-07-24 DIAGNOSIS — E785 Hyperlipidemia, unspecified: Secondary | ICD-10-CM

## 2012-07-24 DIAGNOSIS — R0600 Dyspnea, unspecified: Secondary | ICD-10-CM

## 2012-07-24 DIAGNOSIS — E119 Type 2 diabetes mellitus without complications: Secondary | ICD-10-CM | POA: Insufficient documentation

## 2012-07-24 DIAGNOSIS — I369 Nonrheumatic tricuspid valve disorder, unspecified: Secondary | ICD-10-CM | POA: Insufficient documentation

## 2012-07-24 DIAGNOSIS — Z01810 Encounter for preprocedural cardiovascular examination: Secondary | ICD-10-CM

## 2012-07-24 NOTE — Progress Notes (Signed)
Echocardiogram performed.  

## 2012-07-24 NOTE — Patient Instructions (Addendum)
We need to do another echocardiogram with contrast  We will arrange for a heart catheterization after February 6th  For now, stay on your current medicines  We need to check labs on Monday, February 3rd - you do not need to fast  You are scheduled for a cardiac catheterization on Thursday, February 6th with Dr. Martinique or associate.  Go to [facility] 2nd Floor Short Stay on Thursday, February 6th at 5:30am. No food or drink after midnight on Wednesday. You may take your medications with a sip of water on the day of your procedure except do NOT take your Metformin after Wednesday's dose on February 2nd.     Coronary Angiography Coronary angiography is an X-ray procedure used to look at the arteries in the heart. In this procedure, a dye is injected through a long, hollow tube (catheter). The catheter is about the size of a piece of cooked spaghetti. The catheter injects a dye into an artery in your groin. X-rays are then taken to show if there is a blockage in the arteries of your heart. BEFORE THE PROCEDURE   Let your caregiver know if you have allergies to shellfish or contrast dye. Also let your caregiver know if you have kidney problems or failure.  Do not eat or drink starting from midnight up to the time of the procedure, or as directed.  You may drink enough water to take your medications the morning of the procedure if you were instructed to do so.  You should be at the hospital or outpatient facility where the procedure is to be done 60 minutes prior to the procedure or as directed. PROCEDURE  You may be given an IV medication to help you relax before the procedure.  You will be prepared for the procedure by washing and shaving the area where the catheter will be inserted. This is usually done in the groin but may be done in the fold of your arm by your elbow.  A medicine will be given to numb your groin where the catheter will be inserted.  A specially trained doctor will  insert the catheter into an artery in your groin. The catheter is guided by using a special type of X-ray (fluoroscopy) to the blood vessel being examined.  A special dye is then injected into the catheter and X-rays are taken. The dye helps to show where any narrowing or blockages are located in the heart arteries. AFTER THE PROCEDURE   After the procedure you will be kept in bed lying flat for several hours. You will be instructed to not bend or cross your legs.  The groin insertion site will be watched and checked frequently.  The pulse in your feet will be checked frequently.  Additional blood tests, X-rays and an EKG may be done.  You may stay in the hospital overnight for observation. SEEK IMMEDIATE MEDICAL CARE IF:   You develop chest pain, shortness of breath, feel faint, or pass out.  There is bleeding, swelling, or drainage from the catheter insertion site.  You develop pain, discoloration, coldness, or severe bruising in the leg or area where the catheter was inserted.  You have a fever. Document Released: 12/24/2002 Document Revised: 09/11/2011 Document Reviewed: 02/12/2008 [facility] Patient Information 2013 [place].

## 2012-07-24 NOTE — Progress Notes (Signed)
 Vincent Chandler Date of Birth: 10/30/1960 Medical Record #[id number]  History of Present Illness: Vincent Chandler is seen back today for a follow up visit. He is seen for Dr. Klein. He has an ischemic CM with an EF down to 35% last year and an abnormal Myoview last year with large inferior scar and no ischemia. Then lost to follow up. Just recently "back on track" with getting back on medicines with plans to proceed on with cardiac catheterization.   He comes in today. He is doing ok. No real symptoms. Got his insurance - "silver" Obamacare plan. Tolerating his medicines. Not dizzy or lightheaded. No chest pain. Breathing is good.   Current Outpatient Prescriptions on File Prior to Visit  Medication Sig Dispense Refill  . lisinopril (PRINIVIL,ZESTRIL) 20 MG tablet Take 1 tablet (20 mg total) by mouth daily.  90 tablet  3  . metFORMIN (GLUCOPHAGE-XR) 500 MG 24 hr tablet Take 2 tablets (1,000 mg total) by mouth daily with breakfast.  60 tablet  2  . metoprolol (LOPRESSOR) 50 MG tablet Take 1 tablet (50 mg total) by mouth 2 (two) times daily.  60 tablet  6  . pravastatin (PRAVACHOL) 40 MG tablet Take 1 tablet (40 mg total) by mouth daily.  30 tablet  6  . aspirin EC 81 MG tablet Take 81 mg by mouth daily.        No Known Allergies  Past Medical History  Diagnosis Date  . Coronary artery disease     drug eluting sten RCA 2005-EF 30% at that time  . Cardiomyopathy     alcohol use related  . Asthma   . Hypertension   . Urinary incontinence   . Hyperlipidemia   . History of hypogonadism   . Pneumonia 02/16/2012    "today makes third time in my life"  . OSA on CPAP 02/16/2012    "wear mask sometimes"  . Diabetes mellitus type II   . H/O hiatal hernia   . GERD (gastroesophageal reflux disease)   . Migraines 02/16/2012    "used to"  . Anxiety     Past Surgical History  Procedure Date  . Tonsillectomy ~ 1970  . Coronary angioplasty with stent placement ~ 2003  . Refractive surgery 1990's      bilaterally    History  Smoking status  . Never Smoker   Smokeless tobacco  . Current User  . Types: Chew    Comment: 02/16/2012 smoking cessation materials offered; pt declines    History  Alcohol Use  . Yes    Family History  Problem Relation Age of Onset  . Hypertension Mother   . Diabetes Mother   . Hypertension Father   . Diabetes Father   . Hyperlipidemia Father     Review of Systems: The review of systems is per the HPI.  All other systems were reviewed and are negative.  Physical Exam: BP 147/90  Pulse 64  Ht 5' 10" (1.778 m)  Wt 222 lb (100.699 kg)  BMI 31.85 kg/m2 Patient is very pleasant and in no acute distress. Skin is warm and dry. Color is normal.  HEENT is unremarkable. Normocephalic/atraumatic. PERRL. Sclera are nonicteric. Neck is supple. No masses. No JVD. Lungs are clear. Cardiac exam shows a regular rate and rhythm. Abdomen is soft. Extremities are without edema. Gait and ROM are intact. No gross neurologic deficits noted.  LABORATORY DATA: Lab Results  Component Value Date   WBC 7.6 07/10/2012   HGB 15.8   07/10/2012   HCT 47.0 07/10/2012   PLT 198.0 07/10/2012   GLUCOSE 144* 07/10/2012   CHOL 173 06/04/2012   TRIG 110 06/04/2012   HDL 32* 06/04/2012   LDLDIRECT 153.1 11/03/2010   LDLCALC 119* 06/04/2012   ALT 26 06/03/2012   AST 22 06/03/2012   NA 140 07/10/2012   K 3.9 07/10/2012   CL 105 07/10/2012   CREATININE 1.3 07/10/2012   BUN 20 07/10/2012   CO2 30 07/10/2012   TSH 0.85 10/18/2010   PSA 0.24 03/10/2010   INR 1.0 07/10/2012   HGBA1C 8.9* 06/05/2012   MICROALBUR 5.0* 03/10/2010   Echo Study Conclusions from earlier today (reviewed with Dr. Jordan)  - Left ventricle: Diffuse hypokinesis with inferior wall more severe The cavity size was moderately dilated. Wall thickness was increased in a pattern of mild LVH. Systolic function was moderately to severely reduced. The estimated ejection fraction was in the range of 30% to 35%. Hypokinesis of the entire  myocardium. Left ventricular diastolic function parameters were normal. - Aortic valve: Trivial regurgitation. - Mitral valve: Mild regurgitation. - Left atrium: The atrium was mildly dilated. - Right ventricle: Systolic function was reduced. - Atrial septum: No defect or patent foramen ovale was identified. - Impressions: Cannot R/O apical thrombus   Assessment / Plan:  Ischemic CM - EF of 35% with past abnormal Myoview. Needing cath to evaluate for progression of his CAD. Has had remote RCA stent in 2005 and had an EF of 35% at that time. His echo results from earlier today are noted. This has been reviewed with Dr. Jordan. Will arrange for limited echo with contrast to further define. No embolic symptoms noted. Will proceed with cardiac cath. The procedure, risks and benefits have been reviewed and he is willing to proceed on February 6th with Dr. Jordan. Check labs on February 3rd.   CAD - with remote stenting of the RCA - no chest pain reported.   Patient is agreeable to this plan and will call if any problems develop in the interim.   

## 2012-07-25 ENCOUNTER — Encounter (HOSPITAL_COMMUNITY): Payer: Self-pay | Admitting: Pharmacy Technician

## 2012-07-30 ENCOUNTER — Ambulatory Visit (HOSPITAL_COMMUNITY): Payer: BC Managed Care – PPO | Attending: Internal Medicine

## 2012-07-30 ENCOUNTER — Other Ambulatory Visit (HOSPITAL_COMMUNITY): Payer: Self-pay

## 2012-07-30 DIAGNOSIS — I255 Ischemic cardiomyopathy: Secondary | ICD-10-CM

## 2012-07-30 DIAGNOSIS — I2510 Atherosclerotic heart disease of native coronary artery without angina pectoris: Secondary | ICD-10-CM

## 2012-07-30 DIAGNOSIS — I10 Essential (primary) hypertension: Secondary | ICD-10-CM | POA: Insufficient documentation

## 2012-07-30 DIAGNOSIS — I428 Other cardiomyopathies: Secondary | ICD-10-CM | POA: Insufficient documentation

## 2012-07-30 DIAGNOSIS — E785 Hyperlipidemia, unspecified: Secondary | ICD-10-CM | POA: Insufficient documentation

## 2012-07-30 DIAGNOSIS — R079 Chest pain, unspecified: Secondary | ICD-10-CM

## 2012-07-30 DIAGNOSIS — E119 Type 2 diabetes mellitus without complications: Secondary | ICD-10-CM | POA: Insufficient documentation

## 2012-07-30 MED ORDER — PERFLUTREN PROTEIN A MICROSPH IV SUSP
1.0000 mL | Freq: Once | INTRAVENOUS | Status: AC
  Administered 2012-07-30: 1 mL via INTRAVENOUS

## 2012-07-30 NOTE — Progress Notes (Signed)
Echocardiogram performed.

## 2012-07-31 ENCOUNTER — Encounter (HOSPITAL_COMMUNITY): Payer: Self-pay | Admitting: Pharmacy Technician

## 2012-08-05 ENCOUNTER — Other Ambulatory Visit (INDEPENDENT_AMBULATORY_CARE_PROVIDER_SITE_OTHER): Payer: BC Managed Care – PPO

## 2012-08-05 DIAGNOSIS — I2589 Other forms of chronic ischemic heart disease: Secondary | ICD-10-CM

## 2012-08-05 DIAGNOSIS — I255 Ischemic cardiomyopathy: Secondary | ICD-10-CM

## 2012-08-05 DIAGNOSIS — R079 Chest pain, unspecified: Secondary | ICD-10-CM

## 2012-08-05 LAB — APTT: aPTT: 27.5 s (ref 21.7–28.8)

## 2012-08-05 LAB — CBC WITH DIFFERENTIAL/PLATELET
Basophils Absolute: 0 10*3/uL (ref 0.0–0.1)
Basophils Relative: 0.4 % (ref 0.0–3.0)
Eosinophils Absolute: 0.5 10*3/uL (ref 0.0–0.7)
Eosinophils Relative: 7.7 % — ABNORMAL HIGH (ref 0.0–5.0)
HCT: 46.4 % (ref 39.0–52.0)
Hemoglobin: 15.6 g/dL (ref 13.0–17.0)
Lymphocytes Relative: 18.4 % (ref 12.0–46.0)
Lymphs Abs: 1.3 10*3/uL (ref 0.7–4.0)
MCHC: 33.6 g/dL (ref 30.0–36.0)
MCV: 97.5 fl (ref 78.0–100.0)
Monocytes Absolute: 0.8 10*3/uL (ref 0.1–1.0)
Monocytes Relative: 11.4 % (ref 3.0–12.0)
Neutro Abs: 4.4 10*3/uL (ref 1.4–7.7)
Neutrophils Relative %: 62.1 % (ref 43.0–77.0)
Platelets: 238 10*3/uL (ref 150.0–400.0)
RBC: 4.76 Mil/uL (ref 4.22–5.81)
RDW: 15.5 % — ABNORMAL HIGH (ref 11.5–14.6)
WBC: 7.1 10*3/uL (ref 4.5–10.5)

## 2012-08-05 LAB — BASIC METABOLIC PANEL
BUN: 14 mg/dL (ref 6–23)
CO2: 28 mEq/L (ref 19–32)
Calcium: 9 mg/dL (ref 8.4–10.5)
Chloride: 100 mEq/L (ref 96–112)
Creatinine, Ser: 1.2 mg/dL (ref 0.4–1.5)
GFR: 69.85 mL/min (ref >60.00)
Glucose, Bld: 241 mg/dL — ABNORMAL HIGH (ref 70–99)
Potassium: 4.2 mEq/L (ref 3.5–5.1)
Sodium: 137 mEq/L (ref 135–145)

## 2012-08-05 LAB — PROTIME-INR
INR: 1.2 ratio — ABNORMAL HIGH (ref 0.8–1.0)
Prothrombin Time: 13.1 s — ABNORMAL HIGH (ref 10.2–12.4)

## 2012-08-05 NOTE — Telephone Encounter (Signed)
New problem:   Patient calling lost paperwork for instruction . Can a copy be left at front desk  . Lab work is schedule today.

## 2012-08-05 NOTE — Telephone Encounter (Signed)
Instructions read to pt verbally.

## 2012-08-07 ENCOUNTER — Telehealth: Payer: Self-pay | Admitting: Cardiology

## 2012-08-07 NOTE — Telephone Encounter (Signed)
Spoke to patient he stated he took metformin this morning by mistake.Spoke to Truitt Merle NP she advised do not take any more metformin and do not take metformin 2 days after cath.

## 2012-08-07 NOTE — Telephone Encounter (Signed)
Pt to have a cath tomorrow and was told not to take his metformin this morning and he made a mistake and took it anyway what dose he need to do.

## 2012-08-08 ENCOUNTER — Ambulatory Visit (HOSPITAL_COMMUNITY)
Admission: RE | Admit: 2012-08-08 | Discharge: 2012-08-08 | Disposition: A | Discharge status: Home / Self Care | Payer: BC Managed Care – PPO | Source: Ambulatory Visit | Attending: Cardiology | Admitting: Cardiology

## 2012-08-08 ENCOUNTER — Encounter (HOSPITAL_COMMUNITY)
Admission: RE | Disposition: A | Discharge status: Home / Self Care | Payer: Self-pay | Source: Ambulatory Visit | Attending: Cardiology

## 2012-08-08 DIAGNOSIS — I2510 Atherosclerotic heart disease of native coronary artery without angina pectoris: Secondary | ICD-10-CM | POA: Insufficient documentation

## 2012-08-08 DIAGNOSIS — E119 Type 2 diabetes mellitus without complications: Secondary | ICD-10-CM | POA: Insufficient documentation

## 2012-08-08 DIAGNOSIS — Z01810 Encounter for preprocedural cardiovascular examination: Secondary | ICD-10-CM

## 2012-08-08 DIAGNOSIS — I2589 Other forms of chronic ischemic heart disease: Secondary | ICD-10-CM | POA: Insufficient documentation

## 2012-08-08 DIAGNOSIS — R9439 Abnormal result of other cardiovascular function study: Secondary | ICD-10-CM | POA: Insufficient documentation

## 2012-08-08 DIAGNOSIS — Z9861 Coronary angioplasty status: Secondary | ICD-10-CM | POA: Insufficient documentation

## 2012-08-08 DIAGNOSIS — E785 Hyperlipidemia, unspecified: Secondary | ICD-10-CM

## 2012-08-08 DIAGNOSIS — I10 Essential (primary) hypertension: Secondary | ICD-10-CM

## 2012-08-08 DIAGNOSIS — I5022 Chronic systolic (congestive) heart failure: Secondary | ICD-10-CM

## 2012-08-08 DIAGNOSIS — R0600 Dyspnea, unspecified: Secondary | ICD-10-CM

## 2012-08-08 HISTORY — PX: LEFT HEART CATHETERIZATION WITH CORONARY ANGIOGRAM: SHX5451

## 2012-08-08 SURGERY — LEFT HEART CATHETERIZATION WITH CORONARY ANGIOGRAM
Anesthesia: LOCAL

## 2012-08-08 MED ORDER — ASPIRIN 81 MG PO CHEW
CHEWABLE_TABLET | ORAL | Status: AC
  Filled 2012-08-08: qty 4

## 2012-08-08 MED ORDER — SODIUM CHLORIDE 0.9 % IJ SOLN
3.0000 mL | Freq: Two times a day (BID) | INTRAMUSCULAR | Status: DC
  Administered 2012-08-08: 3 mL via INTRAVENOUS

## 2012-08-08 MED ORDER — HEPARIN (PORCINE) IN NACL 2-0.9 UNIT/ML-% IJ SOLN
INTRAMUSCULAR | Status: AC
  Filled 2012-08-08: qty 1500

## 2012-08-08 MED ORDER — SODIUM CHLORIDE 0.9 % IV SOLN: 250.0000 mL | INTRAVENOUS | Status: DC | PRN

## 2012-08-08 MED ORDER — SODIUM CHLORIDE 0.9 % IJ SOLN: 3.0000 mL | INTRAMUSCULAR | Status: DC | PRN

## 2012-08-08 MED ORDER — DIAZEPAM 5 MG PO TABS
ORAL_TABLET | ORAL | Status: AC
  Filled 2012-08-08: qty 2

## 2012-08-08 MED ORDER — ONDANSETRON HCL 4 MG/2ML IJ SOLN: 4.0000 mg | Freq: Four times a day (QID) | INTRAMUSCULAR | Status: DC | PRN

## 2012-08-08 MED ORDER — METFORMIN HCL 500 MG PO TABS: 1000.0000 mg | ORAL_TABLET | Freq: Every day | ORAL | Status: DC

## 2012-08-08 MED ORDER — VERAPAMIL HCL 2.5 MG/ML IV SOLN
INTRAVENOUS | Status: AC
  Filled 2012-08-08: qty 2

## 2012-08-08 MED ORDER — HEPARIN SODIUM (PORCINE) 1000 UNIT/ML IJ SOLN
INTRAMUSCULAR | Status: AC
  Filled 2012-08-08: qty 1

## 2012-08-08 MED ORDER — ACETAMINOPHEN 325 MG PO TABS: 650.0000 mg | ORAL_TABLET | ORAL | Status: DC | PRN

## 2012-08-08 MED ORDER — ASPIRIN 81 MG PO CHEW
324.0000 mg | CHEWABLE_TABLET | ORAL | Status: AC
  Administered 2012-08-08: 324 mg via ORAL

## 2012-08-08 MED ORDER — SODIUM CHLORIDE 0.9 % IV SOLN: 1.0000 mL/kg/h | INTRAVENOUS | Status: DC

## 2012-08-08 MED ORDER — SODIUM CHLORIDE 0.9 % IV SOLN
INTRAVENOUS | Status: DC
  Administered 2012-08-08: 06:00:00 via INTRAVENOUS

## 2012-08-08 MED ORDER — LIDOCAINE HCL (PF) 1 % IJ SOLN
INTRAMUSCULAR | Status: AC
  Filled 2012-08-08: qty 30

## 2012-08-08 MED ORDER — DIAZEPAM 5 MG PO TABS
10.0000 mg | ORAL_TABLET | ORAL | Status: AC
  Administered 2012-08-08: 10 mg via ORAL

## 2012-08-08 NOTE — Interval H&P Note (Signed)
History and Physical Interval Note:  08/08/2012 7:36 AM  Vincent Chandler  has presented today for surgery, with the diagnosis of Chest pain  The various methods of treatment have been discussed with the patient and family. After consideration of risks, benefits and other options for treatment, the patient has consented to  Procedure(s) (LRB) with comments: LEFT HEART CATHETERIZATION WITH CORONARY ANGIOGRAM (N/A) as a surgical intervention .  The patient's history has been reviewed, patient examined, no change in status, stable for surgery.  I have reviewed the patient's chart and labs.  Questions were answered to the patient's satisfaction.     Collier Salina [facility] 08/08/2012 7:36 AM

## 2012-08-08 NOTE — CV Procedure (Signed)
   Cardiac Catheterization Procedure Note  Name: Vincent Chandler MRN: [id number] DOB: 02-Jul-1961  Procedure: Left Heart Cath, Selective Coronary Angiography, LV angiography  Indication: 52 year old white male with history of coronary disease and cardiomyopathy. He is status post stenting of the mid right coronary in 2005. Stress Myoview study shows a large inferior lateral scar.   Procedural Details: The right wrist was prepped, draped, and anesthetized with 1% lidocaine. Using the modified Seldinger technique, a 5 French sheath was introduced into the right radial artery. 3 mg of verapamil was administered through the sheath, weight-based unfractionated heparin was administered intravenously. Standard Judkins catheters were used for selective coronary angiography and left ventriculography. Catheter exchanges were performed over an exchange length guidewire. There were no immediate procedural complications. A TR band was used for radial hemostasis at the completion of the procedure.  The patient was transferred to the post catheterization recovery area for further monitoring.  Procedural Findings: Hemodynamics: AO 155/100 with a mean of 125 mmHg LV 158/25 mmHg  Coronary angiography: Coronary dominance: right  Left mainstem: Normal.  Left anterior descending (LAD): The LAD is moderately calcified in the proximal vessel. There is a 90% stenosis in the proximal vessel at the takeoff of the second diagonal and first septal perforator branches. In the mid LAD there is a 60% stenosis. The first diagonal has a high takeoff and is without significant disease.  Left circumflex (LCx): The left circumflex is a large vessel which gives rise to a single large marginal branch. There is an 80% stenosis in the mid left circumflex followed by a long segmental 80-90% stenosis in the marginal branch.  Right coronary artery (RCA): The right coronary is a dominant vessel. It is occluded in the mid vessel at  the site of the previous stent. There are very good left to right collaterals to the entire mid to distal right coronary.  Left ventriculography: Left ventricular systolic function is severely reduced. There is inferior wall akinesis with severe global hypokinesis. Overall ejection fraction is estimated at 25%. There is no significant mitral insufficiency.  Final Conclusions:   1. Severe three-vessel obstructive coronary disease. Occlusion of the mid RCA at the prior stent site. 2. Severe left ventricular dysfunction.  Recommendations: Recommend coronary bypass surgery for revascularization.  Adrik Khim [facility] 08/08/2012, 8:09 AM

## 2012-08-08 NOTE — H&P (View-Only) (Signed)
Vincent Chandler Date of Birth: Nov 17, 1960 Medical Record #[id number]  History of Present Illness: Vincent Chandler is seen back today for a follow up visit. He is seen for Dr. Caryl Comes. He has an ischemic CM with an EF down to 35% last year and an abnormal Myoview last year with large inferior scar and no ischemia. Then lost to follow up. Just recently "back on track" with getting back on medicines with plans to proceed on with cardiac catheterization.   He comes in today. He is doing ok. No real symptoms. Got his insurance - "silver" Obamacare plan. Tolerating his medicines. Not dizzy or lightheaded. No chest pain. Breathing is good.   Current Outpatient Prescriptions on File Prior to Visit  Medication Sig Dispense Refill  . lisinopril (PRINIVIL,ZESTRIL) 20 MG tablet Take 1 tablet (20 mg total) by mouth daily.  90 tablet  3  . metFORMIN (GLUCOPHAGE-XR) 500 MG 24 hr tablet Take 2 tablets (1,000 mg total) by mouth daily with breakfast.  60 tablet  2  . metoprolol (LOPRESSOR) 50 MG tablet Take 1 tablet (50 mg total) by mouth 2 (two) times daily.  60 tablet  6  . pravastatin (PRAVACHOL) 40 MG tablet Take 1 tablet (40 mg total) by mouth daily.  30 tablet  6  . aspirin EC 81 MG tablet Take 81 mg by mouth daily.        No Known Allergies  Past Medical History  Diagnosis Date  . Coronary artery disease     drug eluting sten RCA 2005-EF 30% at that time  . Cardiomyopathy     alcohol use related  . Asthma   . Hypertension   . Urinary incontinence   . Hyperlipidemia   . History of hypogonadism   . Pneumonia 02/16/2012    "today makes third time in my life"  . OSA on CPAP 02/16/2012    "wear mask sometimes"  . Diabetes mellitus type II   . H/O hiatal hernia   . GERD (gastroesophageal reflux disease)   . Migraines 02/16/2012    "used to"  . Anxiety     Past Surgical History  Procedure Date  . Tonsillectomy ~ 1970  . Coronary angioplasty with stent placement ~ 2003  . Refractive surgery 1990's      bilaterally    History  Smoking status  . Never Smoker   Smokeless tobacco  . Current User  . Types: Chew    Comment: 02/16/2012 smoking cessation materials offered; pt declines    History  Alcohol Use  . Yes    Family History  Problem Relation Age of Onset  . Hypertension Mother   . Diabetes Mother   . Hypertension Father   . Diabetes Father   . Hyperlipidemia Father     Review of Systems: The review of systems is per the HPI.  All other systems were reviewed and are negative.  Physical Exam: BP 147/90  Pulse 64  Ht _0  (1.778 m)  Wt 222 lb (100.699 kg)  BMI 31.85 kg/m2 Patient is very pleasant and in no acute distress. Skin is warm and dry. Color is normal.  HEENT is unremarkable. Normocephalic/atraumatic. PERRL. Sclera are nonicteric. Neck is supple. No masses. No JVD. Lungs are clear. Cardiac exam shows a regular rate and rhythm. Abdomen is soft. Extremities are without edema. Gait and ROM are intact. No gross neurologic deficits noted.  LABORATORY DATA: Lab Results  Component Value Date   WBC 7.6 07/10/2012   HGB 15.8  07/10/2012   HCT 47.0 07/10/2012   PLT 198.0 07/10/2012   GLUCOSE 144* 07/10/2012   CHOL 173 06/04/2012   TRIG 110 06/04/2012   HDL 32* 06/04/2012   LDLDIRECT 153.1 11/03/2010   LDLCALC 119* 06/04/2012   ALT 26 06/03/2012   AST 22 06/03/2012   NA 140 07/10/2012   K 3.9 07/10/2012   CL 105 07/10/2012   CREATININE 1.3 07/10/2012   BUN 20 07/10/2012   CO2 30 07/10/2012   TSH 0.85 10/18/2010   PSA 0.24 03/10/2010   INR 1.0 07/10/2012   HGBA1C 8.9* 06/05/2012   MICROALBUR 5.0* 03/10/2010   Echo Study Conclusions from earlier today (reviewed with Dr. Martinique)  - Left ventricle: Diffuse hypokinesis with inferior wall more severe The cavity size was moderately dilated. Wall thickness was increased in a pattern of mild LVH. Systolic function was moderately to severely reduced. The estimated ejection fraction was in the range of 30% to 35%. Hypokinesis of the entire  myocardium. Left ventricular diastolic function parameters were normal. - Aortic valve: Trivial regurgitation. - Mitral valve: Mild regurgitation. - Left atrium: The atrium was mildly dilated. - Right ventricle: Systolic function was reduced. - Atrial septum: No defect or patent foramen ovale was identified. - Impressions: Cannot R/O apical thrombus   Assessment / Plan:  Ischemic CM - EF of 35% with past abnormal Myoview. Needing cath to evaluate for progression of his CAD. Has had remote RCA stent in 2005 and had an EF of 35% at that time. His echo results from earlier today are noted. This has been reviewed with Dr. Martinique. Will arrange for limited echo with contrast to further define. No embolic symptoms noted. Will proceed with cardiac cath. The procedure, risks and benefits have been reviewed and he is willing to proceed on February 6th with Dr. Martinique. Check labs on February 3rd.   CAD - with remote stenting of the RCA - no chest pain reported.   Patient is agreeable to this plan and will call if any problems develop in the interim.

## 2012-08-09 ENCOUNTER — Encounter: Payer: Self-pay | Admitting: Cardiothoracic Surgery

## 2012-08-09 ENCOUNTER — Other Ambulatory Visit: Payer: Self-pay

## 2012-08-09 ENCOUNTER — Institutional Professional Consult (permissible substitution) (INDEPENDENT_AMBULATORY_CARE_PROVIDER_SITE_OTHER): Payer: BC Managed Care – PPO | Admitting: Cardiothoracic Surgery

## 2012-08-09 VITALS — BP 118/81 | HR 75 | Resp 20 | Ht 67.0 in | Wt 220.0 lb

## 2012-08-09 DIAGNOSIS — I2510 Atherosclerotic heart disease of native coronary artery without angina pectoris: Secondary | ICD-10-CM

## 2012-08-09 NOTE — Progress Notes (Signed)
PCP is Garnet Koyanagi, DO Referring Provider is Martinique, Peter M, MD   admission history and physical  Chief Complaint  Patient presents with  . Coronary Artery Disease    Cardiac Cath 08/08/12 by Dr Martinique, eval for CABG    HPI: 52 year old obese Caucasian male diabetic nonsmoker presents for discussion of recently diagnosed severe three-vessel coronary disease. 2005 he had a stent placed to the RCA. Recently he developed symptoms of dyspnea on exertion and decreasing exercise tolerance. 2-D echo showed inter-wall hypokinesia with decreased LV function EF of 25. Cardiac catheterization by Dr. Martinique demonstrated chronic occlusion of the right coronary with reconstitution of the distal right via collaterals, high-grade 90% stenosis of the LAD diagonal, 80% stenosis of the circumflex and LVEDP of 25 mmHg patient felt to be a candidate for surgical revitalization due to severe three-vessel disease, diabetes, and LV dysfunction. He denies clear angina but clearly has dyspnea on exertion. He has maintained sinus rhythm.   Past Medical History  Diagnosis Date  . Coronary artery disease     drug eluting sten RCA 2005-EF 30% at that time  . Cardiomyopathy     alcohol use related  . Asthma   . Hypertension   . Urinary incontinence   . Hyperlipidemia   . History of hypogonadism   . Pneumonia 02/16/2012    "today makes third time in my life"  . OSA on CPAP 02/16/2012    "wear mask sometimes"  . Diabetes mellitus type II   . H/O hiatal hernia   . GERD (gastroesophageal reflux disease)   . Migraines 02/16/2012    "used to"  . Anxiety     Past Surgical History  Procedure Date  . Tonsillectomy ~ 1970  . Coronary angioplasty with stent placement ~ 2003  . Refractive surgery 1990's    bilaterally    Family History  Problem Relation Age of Onset  . Hypertension Mother   . Diabetes Mother   . Hypertension Father   . Diabetes Father   . Hyperlipidemia Father    no family history of  CABG  Social History History  Substance Use Topics  . Smoking status: Never Smoker   . Smokeless tobacco: Current User    Types: Chew     Comment: 02/16/2012 smoking cessation materials offered; pt declines  . Alcohol Use: Yes    Current Outpatient Prescriptions  Medication Sig Dispense Refill  . aspirin EC 81 MG tablet Take 81 mg by mouth daily.      Marland Kitchen lisinopril (PRINIVIL,ZESTRIL) 20 MG tablet Take 20 mg by mouth daily.      . metFORMIN (GLUCOPHAGE-XR) 500 MG 24 hr tablet Take 2 tablets (1,000 mg total) by mouth daily with breakfast.  60 tablet  2  . metoprolol (LOPRESSOR) 50 MG tablet Take 1 tablet (50 mg total) by mouth 2 (two) times daily.  60 tablet  6  . pravastatin (PRAVACHOL) 40 MG tablet Take 40 mg by mouth daily.        No Known Allergies  Review of Systems Gen. no fever weight loss no recent upper respiratory infection HEENT No difficulty swallowing no active dental complaints Thorax no recent thoracic trauma or pass thoracic surgery Cardiac sinus rhythm with LV dysfunction severe CAD inter-wall akinesia Abdomen no pain no blood per rectum no jaundice Endocrine positive diabetes A1c status unknown Vascular no DVT claudication or TIA Neurologic no history of stroke or seizure   BP 118/81  Pulse 75  Resp 20  Ht  _0  (1.702 m)  Wt 220 lb (99.791 kg)  BMI 34.46 kg/m2  SpO2 96% Physical Exam General alert and comfortable accompanied by sister HEENT normocephalic pupils equal dentition adequate Neck without JVD mass or bruit Lymphatics no palpable nodes in the neck Thorax clear Breath sounds no deformity or tenderness Cardiac regular rhythm without murmur or S3 Abdomen soft nontender no pulsatile mass  the patient passed a kidney stone in December 2013 at which time his creatinine bumped to 2.5 denies come back to normal as he has passed the stone Chairman knees no clubbing cyanosis or edema are full pulses intact no venous insufficiency noted Neurologic alert  and oriented no focal motor deficit   Diagnostic Tests: Coronary tear grams carefully reviewed. 2-D echo carefully reviewed personally. Severe multivessel coronary disease with LV dysfunction. Graftable vessels  Impression: 52 year old gentleman with severe vessel disease LV dysfunction and class III anginal equivalent of dyspnea on exertion. He would benefit from multivessel bypass grafting. Be scheduled for next week.  Plan: CABG February 13 at [facility] hospital

## 2012-08-12 ENCOUNTER — Encounter (HOSPITAL_COMMUNITY): Payer: Self-pay | Admitting: Pharmacy Technician

## 2012-08-12 LAB — GLUCOSE, CAPILLARY
Glucose-Capillary: 151 mg/dL — ABNORMAL HIGH (ref 70–99)
Glucose-Capillary: 106 mg/dL — ABNORMAL HIGH (ref 70–99)

## 2012-08-13 ENCOUNTER — Ambulatory Visit (HOSPITAL_COMMUNITY)
Admission: RE | Admit: 2012-08-13 | Discharge: 2012-08-13 | Disposition: A | Discharge status: Home / Self Care | Payer: BC Managed Care – PPO | Source: Ambulatory Visit | Attending: Cardiothoracic Surgery | Admitting: Cardiothoracic Surgery

## 2012-08-13 ENCOUNTER — Encounter (HOSPITAL_COMMUNITY): Payer: Self-pay

## 2012-08-13 ENCOUNTER — Encounter (HOSPITAL_COMMUNITY)
Admission: RE | Admit: 2012-08-13 | Discharge: 2012-08-13 | Disposition: A | Discharge status: Home / Self Care | Payer: BC Managed Care – PPO | Source: Ambulatory Visit | Attending: Cardiothoracic Surgery | Admitting: Cardiothoracic Surgery

## 2012-08-13 VITALS — BP 129/80 | HR 88 | Temp 98.2°F | Resp 20 | Ht 69.0 in | Wt 217.5 lb

## 2012-08-13 DIAGNOSIS — I2510 Atherosclerotic heart disease of native coronary artery without angina pectoris: Secondary | ICD-10-CM

## 2012-08-13 DIAGNOSIS — Z01818 Encounter for other preprocedural examination: Secondary | ICD-10-CM | POA: Insufficient documentation

## 2012-08-13 HISTORY — DX: Personal history of urinary calculi: Z87.442

## 2012-08-13 LAB — COMPREHENSIVE METABOLIC PANEL
ALT: 18 U/L (ref 0–53)
AST: 16 U/L (ref 0–37)
Albumin: 3.5 g/dL (ref 3.5–5.2)
Alkaline Phosphatase: 80 U/L (ref 39–117)
BUN: 15 mg/dL (ref 6–23)
CO2: 22 mEq/L (ref 19–32)
Calcium: 9.4 mg/dL (ref 8.4–10.5)
Chloride: 102 mEq/L (ref 96–112)
Creatinine, Ser: 0.96 mg/dL (ref 0.50–1.35)
GFR calc Af Amer: >90 mL/min (ref >90)
GFR calc non Af Amer: >90 mL/min (ref >90)
Glucose, Bld: 168 mg/dL — ABNORMAL HIGH (ref 70–99)
Potassium: 4.2 mEq/L (ref 3.5–5.1)
Sodium: 137 mEq/L (ref 135–145)
Total Bilirubin: 0.4 mg/dL (ref 0.3–1.2)
Total Protein: 7.8 g/dL (ref 6.0–8.3)

## 2012-08-13 LAB — PULMONARY FUNCTION TEST

## 2012-08-13 LAB — BLOOD GAS, ARTERIAL
Acid-base deficit: 0.6 mmol/L (ref 0.0–2.0)
Bicarbonate: 23.5 mEq/L (ref 20.0–24.0)
Drawn by: 344381
FIO2: 0.21 %
O2 Saturation: 95.2 %
Patient temperature: 98.6
TCO2: 24.7 mmol/L (ref 0–100)
pCO2 arterial: 38.4 mmHg (ref 35.0–45.0)
pH, Arterial: 7.404 (ref 7.350–7.450)
pO2, Arterial: 75.3 mmHg — ABNORMAL LOW (ref 80.0–100.0)

## 2012-08-13 LAB — URINALYSIS, ROUTINE W REFLEX MICROSCOPIC
Bilirubin Urine: NEGATIVE
Glucose, UA: NEGATIVE mg/dL
Hgb urine dipstick: NEGATIVE
Ketones, ur: NEGATIVE mg/dL
Leukocytes, UA: NEGATIVE
Nitrite: NEGATIVE
Protein, ur: NEGATIVE mg/dL
Specific Gravity, Urine: 1.02 (ref 1.005–1.030)
Urobilinogen, UA: 0.2 mg/dL (ref 0.0–1.0)
pH: 5 (ref 5.0–8.0)

## 2012-08-13 LAB — SURGICAL PCR SCREEN
MRSA, PCR: NEGATIVE
Staphylococcus aureus: POSITIVE — AB

## 2012-08-13 LAB — CBC
HCT: 44.1 % (ref 39.0–52.0)
Hemoglobin: 14.8 g/dL (ref 13.0–17.0)
MCH: 32 pg (ref 26.0–34.0)
MCHC: 33.6 g/dL (ref 30.0–36.0)
MCV: 95.5 fL (ref 78.0–100.0)
Platelets: 220 10*3/uL (ref 150–400)
RBC: 4.62 MIL/uL (ref 4.22–5.81)
RDW: 14.1 % (ref 11.5–15.5)
WBC: 6.2 10*3/uL (ref 4.0–10.5)

## 2012-08-13 LAB — PROTIME-INR
INR: 0.99 (ref 0.00–1.49)
Prothrombin Time: 13 seconds (ref 11.6–15.2)

## 2012-08-13 LAB — APTT: aPTT: 28 seconds (ref 24–37)

## 2012-08-13 LAB — TYPE AND SCREEN
ABO/RH(D): A POS
Antibody Screen: NEGATIVE

## 2012-08-13 LAB — HEMOGLOBIN A1C
Hgb A1c MFr Bld: 8.2 % — ABNORMAL HIGH (ref <5.7)
Mean Plasma Glucose: 189 mg/dL — ABNORMAL HIGH (ref <117)

## 2012-08-13 LAB — ABO/RH: ABO/RH(D): A POS

## 2012-08-13 MED ORDER — ALBUTEROL SULFATE (5 MG/ML) 0.5% IN NEBU
2.5000 mg | INHALATION_SOLUTION | Freq: Once | RESPIRATORY_TRACT | Status: AC
  Administered 2012-08-13: 2.5 mg via RESPIRATORY_TRACT

## 2012-08-13 NOTE — Pre-Procedure Instructions (Signed)
Vincent Chandler  08/13/2012   Your procedure is scheduled on:  Thursday, Feb 13  Report to [facility].  Call this number if you have problems the morning of surgery: [phone]   Remember:   Do not eat food or drink liquids after midnight.Wednesday night   Take these medicines the morning of surgery with A SIP OF WATER: Metoprolol   Do not wear jewelry, make-up or nail polish.  Do not wear lotions, powders, or perfumes. You may not wear deodorant.  Do not shave 48 hours prior to surgery. Men may shave face and neck.  Do not bring valuables to the hospital.  Contacts, dentures or bridgework may not be worn into surgery.  Leave suitcase in the car. After surgery it may be brought to your room.  For patients admitted to the hospital, checkout time is 11:00 AM the day of  discharge.       Special Instructions: Incentive Spirometry - Practice and bring it with you on the day of surgery. Shower using CHG 2 nights before surgery and the night before surgery.  If you shower the day of surgery use CHG.  Use special wash - you have one bottle of CHG for all showers.  You should use approximately 1/3 of the bottle for each shower.   Please read over the following fact sheets that you were given: Pain Booklet, Coughing and Deep Breathing, Blood Transfusion Information, Open Heart Packet and Surgical Site Infection Prevention

## 2012-08-13 NOTE — Progress Notes (Signed)
VASCULAR LAB PRELIMINARY  PRELIMINARY  PRELIMINARY  PRELIMINARY  Pre-op Cardiac Surgery  Carotid Findings:  Bilateral:  No evidence of hemodynamically significant internal carotid artery stenosis.   Vertebral artery flow is antegrade.     Upper Extremity Right Left  Brachial Pressures 142 Triphasic 146 Triphasic  Radial Waveforms Triphasic Triphasic  Ulnar Waveforms Triphasic Triphasic  Palmar Arch (Allen's Test) Normal Normal   Findings:   Doppler waveforms remained normal bilaterally with both radial and ulnar compressions   Lower  Extremity Right Left  Dorsalis Pedis 147 Triphasic 144 Triphasic      Posterior Tibial 147 Triphasic 166 Triphasic  Ankle/Brachial Indices 1.07 1.20    Findings:  ABIs and Doppler waveforms are within normal limits bilaterally at rest   Keandra Medero, RVS 08/13/2012, 2:07 PM

## 2012-08-14 ENCOUNTER — Ambulatory Visit: Payer: BC Managed Care – PPO | Admitting: Nurse Practitioner

## 2012-08-14 MED ORDER — METOPROLOL TARTRATE 12.5 MG HALF TABLET: 12.5000 mg | ORAL_TABLET | Freq: Once | ORAL | Status: DC

## 2012-08-14 MED ORDER — DEXTROSE 5 % IV SOLN
1.5000 g | INTRAVENOUS | Status: AC
  Administered 2012-08-15: 1.5 g via INTRAVENOUS
  Administered 2012-08-15: .75 g via INTRAVENOUS
  Filled 2012-08-14 (×2): qty 1.5

## 2012-08-14 MED ORDER — MAGNESIUM SULFATE 50 % IJ SOLN
40.0000 meq | INTRAMUSCULAR | Status: DC
  Filled 2012-08-14 (×2): qty 10

## 2012-08-14 MED ORDER — POTASSIUM CHLORIDE 2 MEQ/ML IV SOLN
80.0000 meq | INTRAVENOUS | Status: DC
  Filled 2012-08-14 (×2): qty 40

## 2012-08-14 MED ORDER — DEXMEDETOMIDINE HCL IN NACL 400 MCG/100ML IV SOLN
0.1000 ug/kg/h | INTRAVENOUS | Status: AC
  Administered 2012-08-15: 0.2 ug/kg/h via INTRAVENOUS
  Filled 2012-08-14 (×2): qty 100

## 2012-08-14 MED ORDER — SODIUM CHLORIDE 0.9 % IV SOLN
INTRAVENOUS | Status: AC
  Administered 2012-08-15: 70 mL/h via INTRAVENOUS
  Filled 2012-08-14 (×2): qty 40

## 2012-08-14 MED ORDER — VERAPAMIL HCL 2.5 MG/ML IV SOLN
INTRAVENOUS | Status: AC
  Administered 2012-08-15: 07:00:00
  Filled 2012-08-14 (×2): qty 2.5

## 2012-08-14 MED ORDER — SODIUM CHLORIDE 0.9 % IV SOLN
1500.0000 mg | INTRAVENOUS | Status: AC
  Administered 2012-08-15: 1500 mg via INTRAVENOUS
  Filled 2012-08-14 (×2): qty 1500

## 2012-08-14 MED ORDER — DOPAMINE-DEXTROSE 3.2-5 MG/ML-% IV SOLN
2.0000 ug/kg/min | INTRAVENOUS | Status: AC
  Administered 2012-08-15: 3 ug/kg/min via INTRAVENOUS
  Filled 2012-08-14: qty 250

## 2012-08-14 MED ORDER — EPINEPHRINE HCL 1 MG/ML IJ SOLN
0.5000 ug/min | INTRAVENOUS | Status: DC
  Filled 2012-08-14 (×2): qty 4

## 2012-08-14 MED ORDER — SODIUM CHLORIDE 0.9 % IV SOLN
INTRAVENOUS | Status: AC
  Administered 2012-08-15: 3 [IU]/h via INTRAVENOUS
  Filled 2012-08-14 (×2): qty 1

## 2012-08-14 MED ORDER — PHENYLEPHRINE HCL 10 MG/ML IJ SOLN
30.0000 ug/min | INTRAVENOUS | Status: AC
  Administered 2012-08-15: 15 ug/min via INTRAVENOUS
  Filled 2012-08-14 (×2): qty 2

## 2012-08-14 MED ORDER — NITROGLYCERIN IN D5W 200-5 MCG/ML-% IV SOLN
2.0000 ug/min | INTRAVENOUS | Status: AC
  Administered 2012-08-15: 5 ug/min via INTRAVENOUS
  Filled 2012-08-14 (×2): qty 250

## 2012-08-14 MED ORDER — DEXTROSE 5 % IV SOLN
750.0000 mg | INTRAVENOUS | Status: DC
  Filled 2012-08-14 (×2): qty 750

## 2012-08-15 ENCOUNTER — Inpatient Hospital Stay (HOSPITAL_COMMUNITY): Payer: BC Managed Care – PPO

## 2012-08-15 ENCOUNTER — Ambulatory Visit (HOSPITAL_COMMUNITY): Payer: BC Managed Care – PPO | Admitting: Certified Registered"

## 2012-08-15 ENCOUNTER — Inpatient Hospital Stay (HOSPITAL_COMMUNITY)
Admission: RE | Admit: 2012-08-15 | Discharge: 2012-08-22 | DRG: 109 | Disposition: A | Discharge status: Home / Self Care | Payer: BC Managed Care – PPO | Source: Ambulatory Visit | Attending: Cardiothoracic Surgery | Admitting: Cardiothoracic Surgery

## 2012-08-15 ENCOUNTER — Encounter (HOSPITAL_COMMUNITY): Payer: Self-pay | Admitting: Certified Registered"

## 2012-08-15 ENCOUNTER — Encounter (HOSPITAL_COMMUNITY): Payer: Self-pay | Admitting: *Deleted

## 2012-08-15 ENCOUNTER — Encounter (HOSPITAL_COMMUNITY)
Admission: RE | Disposition: A | Discharge status: Home / Self Care | Payer: Self-pay | Source: Ambulatory Visit | Attending: Cardiothoracic Surgery

## 2012-08-15 DIAGNOSIS — I4891 Unspecified atrial fibrillation: Secondary | ICD-10-CM | POA: Diagnosis not present

## 2012-08-15 DIAGNOSIS — K219 Gastro-esophageal reflux disease without esophagitis: Secondary | ICD-10-CM | POA: Diagnosis present

## 2012-08-15 DIAGNOSIS — Z951 Presence of aortocoronary bypass graft: Secondary | ICD-10-CM

## 2012-08-15 DIAGNOSIS — I2589 Other forms of chronic ischemic heart disease: Secondary | ICD-10-CM | POA: Diagnosis present

## 2012-08-15 DIAGNOSIS — I2510 Atherosclerotic heart disease of native coronary artery without angina pectoris: Secondary | ICD-10-CM

## 2012-08-15 DIAGNOSIS — F411 Generalized anxiety disorder: Secondary | ICD-10-CM | POA: Diagnosis present

## 2012-08-15 DIAGNOSIS — E785 Hyperlipidemia, unspecified: Secondary | ICD-10-CM

## 2012-08-15 DIAGNOSIS — F1021 Alcohol dependence, in remission: Secondary | ICD-10-CM | POA: Diagnosis present

## 2012-08-15 DIAGNOSIS — I5022 Chronic systolic (congestive) heart failure: Secondary | ICD-10-CM

## 2012-08-15 DIAGNOSIS — K449 Diaphragmatic hernia without obstruction or gangrene: Secondary | ICD-10-CM | POA: Diagnosis present

## 2012-08-15 DIAGNOSIS — D696 Thrombocytopenia, unspecified: Secondary | ICD-10-CM | POA: Diagnosis not present

## 2012-08-15 DIAGNOSIS — I517 Cardiomegaly: Secondary | ICD-10-CM | POA: Diagnosis present

## 2012-08-15 DIAGNOSIS — R0600 Dyspnea, unspecified: Secondary | ICD-10-CM

## 2012-08-15 DIAGNOSIS — D62 Acute posthemorrhagic anemia: Secondary | ICD-10-CM | POA: Diagnosis not present

## 2012-08-15 DIAGNOSIS — G4733 Obstructive sleep apnea (adult) (pediatric): Secondary | ICD-10-CM | POA: Diagnosis present

## 2012-08-15 DIAGNOSIS — E871 Hypo-osmolality and hyponatremia: Secondary | ICD-10-CM | POA: Diagnosis not present

## 2012-08-15 DIAGNOSIS — E669 Obesity, unspecified: Secondary | ICD-10-CM | POA: Diagnosis present

## 2012-08-15 DIAGNOSIS — J45909 Unspecified asthma, uncomplicated: Secondary | ICD-10-CM | POA: Diagnosis present

## 2012-08-15 DIAGNOSIS — I509 Heart failure, unspecified: Secondary | ICD-10-CM | POA: Diagnosis present

## 2012-08-15 DIAGNOSIS — I10 Essential (primary) hypertension: Secondary | ICD-10-CM | POA: Diagnosis present

## 2012-08-15 DIAGNOSIS — I255 Ischemic cardiomyopathy: Secondary | ICD-10-CM | POA: Diagnosis present

## 2012-08-15 DIAGNOSIS — E119 Type 2 diabetes mellitus without complications: Secondary | ICD-10-CM | POA: Diagnosis present

## 2012-08-15 HISTORY — PX: CORONARY ARTERY BYPASS GRAFT: SHX141

## 2012-08-15 LAB — CBC
HCT: 36.1 % — ABNORMAL LOW (ref 39.0–52.0)
Hemoglobin: 12.2 g/dL — ABNORMAL LOW (ref 13.0–17.0)
MCH: 33.2 pg (ref 26.0–34.0)
MCH: 32.4 pg (ref 26.0–34.0)
MCHC: 34.9 g/dL (ref 30.0–36.0)
MCHC: 33.8 g/dL (ref 30.0–36.0)
MCV: 95.8 fL (ref 78.0–100.0)
Platelets: 140 10*3/uL — ABNORMAL LOW (ref 150–400)
Platelets: 168 10*3/uL (ref 150–400)
RBC: 3.73 MIL/uL — ABNORMAL LOW (ref 4.22–5.81)
RBC: 3.77 MIL/uL — ABNORMAL LOW (ref 4.22–5.81)
RDW: 14.4 % (ref 11.5–15.5)
WBC: 8.1 10*3/uL (ref 4.0–10.5)

## 2012-08-15 LAB — POCT I-STAT 3, ART BLOOD GAS (G3+)
Acid-base deficit: 1 mmol/L (ref 0.0–2.0)
Acid-base deficit: 2 mmol/L (ref 0.0–2.0)
Acid-base deficit: 1 mmol/L (ref 0.0–2.0)
Acid-base deficit: 2 mmol/L (ref 0.0–2.0)
Acid-base deficit: 2 mmol/L (ref 0.0–2.0)
Bicarbonate: 24.7 mEq/L — ABNORMAL HIGH (ref 20.0–24.0)
Bicarbonate: 24.8 mEq/L — ABNORMAL HIGH (ref 20.0–24.0)
Bicarbonate: 24.4 mEq/L — ABNORMAL HIGH (ref 20.0–24.0)
Bicarbonate: 23.4 mEq/L (ref 20.0–24.0)
Bicarbonate: 23.8 mEq/L (ref 20.0–24.0)
O2 Saturation: 100 %
O2 Saturation: 99 %
O2 Saturation: 98 %
O2 Saturation: 93 %
O2 Saturation: 96 %
Patient temperature: 36
Patient temperature: 37.7
Patient temperature: 38.3
TCO2: 26 mmol/L (ref 0–100)
TCO2: 26 mmol/L (ref 0–100)
TCO2: 26 mmol/L (ref 0–100)
TCO2: 25 mmol/L (ref 0–100)
TCO2: 25 mmol/L (ref 0–100)
pCO2 arterial: 44 mmHg (ref 35.0–45.0)
pCO2 arterial: 47.8 mmHg — ABNORMAL HIGH (ref 35.0–45.0)
pCO2 arterial: 39.8 mmHg (ref 35.0–45.0)
pCO2 arterial: 43.9 mmHg (ref 35.0–45.0)
pCO2 arterial: 48.1 mmHg — ABNORMAL HIGH (ref 35.0–45.0)
pH, Arterial: 7.357 (ref 7.350–7.450)
pH, Arterial: 7.323 — ABNORMAL LOW (ref 7.350–7.450)
pH, Arterial: 7.391 (ref 7.350–7.450)
pH, Arterial: 7.338 — ABNORMAL LOW (ref 7.350–7.450)
pH, Arterial: 7.31 — ABNORMAL LOW (ref 7.350–7.450)
pO2, Arterial: 255 mmHg — ABNORMAL HIGH (ref 80.0–100.0)
pO2, Arterial: 168 mmHg — ABNORMAL HIGH (ref 80.0–100.0)
pO2, Arterial: 101 mmHg — ABNORMAL HIGH (ref 80.0–100.0)
pO2, Arterial: 76 mmHg — ABNORMAL LOW (ref 80.0–100.0)
pO2, Arterial: 97 mmHg (ref 80.0–100.0)

## 2012-08-15 LAB — POCT I-STAT, CHEM 8
BUN: 13 mg/dL (ref 6–23)
Calcium, Ion: 1.15 mmol/L (ref 1.12–1.23)
Chloride: 108 mEq/L (ref 96–112)
Creatinine, Ser: 1 mg/dL (ref 0.50–1.35)
Glucose, Bld: 148 mg/dL — ABNORMAL HIGH (ref 70–99)
HCT: 36 % — ABNORMAL LOW (ref 39.0–52.0)
Hemoglobin: 12.2 g/dL — ABNORMAL LOW (ref 13.0–17.0)
Potassium: 4.6 mEq/L (ref 3.5–5.1)
Sodium: 140 mEq/L (ref 135–145)
TCO2: 24 mmol/L (ref 0–100)

## 2012-08-15 LAB — POCT I-STAT 4, (NA,K, GLUC, HGB,HCT)
Glucose, Bld: 159 mg/dL — ABNORMAL HIGH (ref 70–99)
Glucose, Bld: 137 mg/dL — ABNORMAL HIGH (ref 70–99)
Glucose, Bld: 154 mg/dL — ABNORMAL HIGH (ref 70–99)
Glucose, Bld: 157 mg/dL — ABNORMAL HIGH (ref 70–99)
Glucose, Bld: 184 mg/dL — ABNORMAL HIGH (ref 70–99)
Glucose, Bld: 203 mg/dL — ABNORMAL HIGH (ref 70–99)
Glucose, Bld: 155 mg/dL — ABNORMAL HIGH (ref 70–99)
HCT: 43 % (ref 39.0–52.0)
HCT: 31 % — ABNORMAL LOW (ref 39.0–52.0)
HCT: 37 % — ABNORMAL LOW (ref 39.0–52.0)
HCT: 33 % — ABNORMAL LOW (ref 39.0–52.0)
HCT: 32 % — ABNORMAL LOW (ref 39.0–52.0)
HCT: 33 % — ABNORMAL LOW (ref 39.0–52.0)
HCT: 36 % — ABNORMAL LOW (ref 39.0–52.0)
Hemoglobin: 14.6 g/dL (ref 13.0–17.0)
Hemoglobin: 10.5 g/dL — ABNORMAL LOW (ref 13.0–17.0)
Hemoglobin: 12.6 g/dL — ABNORMAL LOW (ref 13.0–17.0)
Hemoglobin: 11.2 g/dL — ABNORMAL LOW (ref 13.0–17.0)
Hemoglobin: 10.9 g/dL — ABNORMAL LOW (ref 13.0–17.0)
Hemoglobin: 11.2 g/dL — ABNORMAL LOW (ref 13.0–17.0)
Hemoglobin: 12.2 g/dL — ABNORMAL LOW (ref 13.0–17.0)
Potassium: 4.9 mEq/L (ref 3.5–5.1)
Potassium: 3.7 mEq/L (ref 3.5–5.1)
Potassium: 4.2 mEq/L (ref 3.5–5.1)
Potassium: 3.9 mEq/L (ref 3.5–5.1)
Potassium: 4.8 mEq/L (ref 3.5–5.1)
Potassium: 3.8 mEq/L (ref 3.5–5.1)
Potassium: 3.9 mEq/L (ref 3.5–5.1)
Sodium: 139 mEq/L (ref 135–145)
Sodium: 136 mEq/L (ref 135–145)
Sodium: 140 mEq/L (ref 135–145)
Sodium: 138 mEq/L (ref 135–145)
Sodium: 139 mEq/L (ref 135–145)
Sodium: 140 mEq/L (ref 135–145)
Sodium: 141 mEq/L (ref 135–145)

## 2012-08-15 LAB — GLUCOSE, CAPILLARY
Glucose-Capillary: 143 mg/dL — ABNORMAL HIGH (ref 70–99)
Glucose-Capillary: 105 mg/dL — ABNORMAL HIGH (ref 70–99)
Glucose-Capillary: 100 mg/dL — ABNORMAL HIGH (ref 70–99)
Glucose-Capillary: 116 mg/dL — ABNORMAL HIGH (ref 70–99)
Glucose-Capillary: 125 mg/dL — ABNORMAL HIGH (ref 70–99)
Glucose-Capillary: 129 mg/dL — ABNORMAL HIGH (ref 70–99)

## 2012-08-15 LAB — PROTIME-INR
INR: 1.32 (ref 0.00–1.49)
Prothrombin Time: 16.1 seconds — ABNORMAL HIGH (ref 11.6–15.2)

## 2012-08-15 LAB — HEMOGLOBIN AND HEMATOCRIT, BLOOD
HCT: 33.2 % — ABNORMAL LOW (ref 39.0–52.0)
Hemoglobin: 11.6 g/dL — ABNORMAL LOW (ref 13.0–17.0)

## 2012-08-15 LAB — MAGNESIUM: Magnesium: 2.7 mg/dL — ABNORMAL HIGH (ref 1.5–2.5)

## 2012-08-15 LAB — CREATININE, SERUM
Creatinine, Ser: 0.89 mg/dL (ref 0.50–1.35)
GFR calc Af Amer: >90 mL/min (ref >90)
GFR calc non Af Amer: >90 mL/min (ref >90)

## 2012-08-15 SURGERY — CORONARY ARTERY BYPASS GRAFTING (CABG)
Anesthesia: General | Site: Chest | Wound class: Clean

## 2012-08-15 MED ORDER — ASPIRIN EC 325 MG PO TBEC
325.0000 mg | DELAYED_RELEASE_TABLET | Freq: Every day | ORAL | Status: DC
  Administered 2012-08-16: 325 mg via ORAL
  Filled 2012-08-15 (×2): qty 1

## 2012-08-15 MED ORDER — FAMOTIDINE IN NACL 20-0.9 MG/50ML-% IV SOLN
20.0000 mg | Freq: Two times a day (BID) | INTRAVENOUS | Status: AC
  Administered 2012-08-15 (×2): 20 mg via INTRAVENOUS
  Filled 2012-08-15: qty 50

## 2012-08-15 MED ORDER — PROTAMINE SULFATE 10 MG/ML IV SOLN
INTRAVENOUS | Status: DC | PRN
  Administered 2012-08-15: 20 mg via INTRAVENOUS
  Administered 2012-08-15: 10 mg via INTRAVENOUS
  Administered 2012-08-15 (×4): 50 mg via INTRAVENOUS

## 2012-08-15 MED ORDER — SODIUM CHLORIDE 0.9 % IJ SOLN: 3.0000 mL | INTRAMUSCULAR | Status: DC | PRN

## 2012-08-15 MED ORDER — BISACODYL 10 MG RE SUPP: 10.0000 mg | Freq: Every day | RECTAL | Status: DC

## 2012-08-15 MED ORDER — SODIUM CHLORIDE 0.9 % IV SOLN
INTRAVENOUS | Status: DC
  Filled 2012-08-15 (×3): qty 1

## 2012-08-15 MED ORDER — HEPARIN SODIUM (PORCINE) 1000 UNIT/ML IJ SOLN
INTRAMUSCULAR | Status: DC | PRN
  Administered 2012-08-15: 33000 [IU] via INTRAVENOUS
  Administered 2012-08-15: 5000 [IU] via INTRAVENOUS
  Administered 2012-08-15: 2000 [IU] via INTRAVENOUS

## 2012-08-15 MED ORDER — LACTATED RINGERS IV SOLN
INTRAVENOUS | Status: DC | PRN
  Administered 2012-08-15 (×2): via INTRAVENOUS

## 2012-08-15 MED ORDER — PHENYLEPHRINE HCL 10 MG/ML IJ SOLN
0.0000 ug/min | INTRAMUSCULAR | Status: DC
  Filled 2012-08-15 (×2): qty 2

## 2012-08-15 MED ORDER — DOCUSATE SODIUM 100 MG PO CAPS
200.0000 mg | ORAL_CAPSULE | Freq: Every day | ORAL | Status: DC
  Administered 2012-08-16: 200 mg via ORAL
  Filled 2012-08-15: qty 2

## 2012-08-15 MED ORDER — SODIUM CHLORIDE 0.9 % IJ SOLN
OROMUCOSAL | Status: DC | PRN
  Administered 2012-08-15 (×3): via TOPICAL

## 2012-08-15 MED ORDER — PANTOPRAZOLE SODIUM 40 MG PO TBEC
40.0000 mg | DELAYED_RELEASE_TABLET | Freq: Every day | ORAL | Status: DC
  Filled 2012-08-15: qty 1

## 2012-08-15 MED ORDER — MORPHINE SULFATE 2 MG/ML IJ SOLN
1.0000 mg | INTRAMUSCULAR | Status: AC | PRN
  Administered 2012-08-15: 2 mg via INTRAVENOUS
  Administered 2012-08-15: 4 mg via INTRAVENOUS
  Administered 2012-08-15 (×2): 2 mg via INTRAVENOUS

## 2012-08-15 MED ORDER — ALBUMIN HUMAN 5 % IV SOLN: 250.0000 mL | INTRAVENOUS | Status: AC | PRN

## 2012-08-15 MED ORDER — ONDANSETRON HCL 4 MG/2ML IJ SOLN: 4.0000 mg | Freq: Four times a day (QID) | INTRAMUSCULAR | Status: DC | PRN

## 2012-08-15 MED ORDER — SODIUM CHLORIDE 0.9 % IJ SOLN
3.0000 mL | Freq: Two times a day (BID) | INTRAMUSCULAR | Status: DC
  Administered 2012-08-16 (×2): 3 mL via INTRAVENOUS

## 2012-08-15 MED ORDER — ROCURONIUM BROMIDE 100 MG/10ML IV SOLN
INTRAVENOUS | Status: DC | PRN
  Administered 2012-08-15: 20 mg via INTRAVENOUS
  Administered 2012-08-15 (×3): 25 mg via INTRAVENOUS
  Administered 2012-08-15: 50 mg via INTRAVENOUS

## 2012-08-15 MED ORDER — ACETAMINOPHEN 160 MG/5ML PO SOLN: 975.0000 mg | Freq: Four times a day (QID) | ORAL | Status: DC

## 2012-08-15 MED ORDER — METOPROLOL TARTRATE 25 MG/10 ML ORAL SUSPENSION
12.5000 mg | Freq: Two times a day (BID) | ORAL | Status: DC
  Filled 2012-08-15 (×3): qty 5

## 2012-08-15 MED ORDER — LEVALBUTEROL HCL 1.25 MG/0.5ML IN NEBU
1.2500 mg | INHALATION_SOLUTION | Freq: Four times a day (QID) | RESPIRATORY_TRACT | Status: DC
  Administered 2012-08-15 – 2012-08-17 (×6): 1.25 mg via RESPIRATORY_TRACT
  Filled 2012-08-15 (×11): qty 0.5

## 2012-08-15 MED ORDER — 0.9 % SODIUM CHLORIDE (POUR BTL) OPTIME
TOPICAL | Status: DC | PRN
  Administered 2012-08-15: 6000 mL

## 2012-08-15 MED ORDER — LACTATED RINGERS IV SOLN: INTRAVENOUS | Status: DC

## 2012-08-15 MED ORDER — HEMOSTATIC AGENTS (NO CHARGE) OPTIME
TOPICAL | Status: DC | PRN
  Administered 2012-08-15: 1 via TOPICAL

## 2012-08-15 MED ORDER — METOPROLOL TARTRATE 12.5 MG HALF TABLET
12.5000 mg | ORAL_TABLET | Freq: Two times a day (BID) | ORAL | Status: DC
  Administered 2012-08-15: 12.5 mg via ORAL
  Filled 2012-08-15 (×3): qty 1

## 2012-08-15 MED ORDER — POTASSIUM CHLORIDE 10 MEQ/50ML IV SOLN
10.0000 meq | INTRAVENOUS | Status: AC
Start: 2012-08-15 — End: 2012-08-15
  Administered 2012-08-15 (×3): 10 meq via INTRAVENOUS

## 2012-08-15 MED ORDER — SODIUM CHLORIDE 0.9 % IV SOLN: 250.0000 mL | INTRAVENOUS | Status: DC | PRN

## 2012-08-15 MED ORDER — FENTANYL CITRATE 0.05 MG/ML IJ SOLN
INTRAMUSCULAR | Status: DC | PRN
  Administered 2012-08-15: 50 ug via INTRAVENOUS
  Administered 2012-08-15: 250 ug via INTRAVENOUS
  Administered 2012-08-15: 50 ug via INTRAVENOUS
  Administered 2012-08-15: 250 ug via INTRAVENOUS

## 2012-08-15 MED ORDER — METOPROLOL TARTRATE 1 MG/ML IV SOLN
2.5000 mg | INTRAVENOUS | Status: DC | PRN
  Administered 2012-08-15: 2.5 mg via INTRAVENOUS

## 2012-08-15 MED ORDER — SODIUM CHLORIDE 0.9 % IV SOLN
INTRAVENOUS | Status: DC
  Administered 2012-08-15 – 2012-08-16 (×2): via INTRAVENOUS

## 2012-08-15 MED ORDER — VANCOMYCIN HCL IN DEXTROSE 1-5 GM/200ML-% IV SOLN
1000.0000 mg | Freq: Two times a day (BID) | INTRAVENOUS | Status: DC
  Administered 2012-08-15 – 2012-08-17 (×4): 1000 mg via INTRAVENOUS
  Filled 2012-08-15 (×4): qty 200

## 2012-08-15 MED ORDER — FENTANYL CITRATE 0.05 MG/ML IJ SOLN
INTRAMUSCULAR | Status: DC | PRN
  Administered 2012-08-15 (×4): 250 ug via INTRAVENOUS

## 2012-08-15 MED ORDER — BUDESONIDE-FORMOTEROL FUMARATE 160-4.5 MCG/ACT IN AERO
2.0000 | INHALATION_SPRAY | Freq: Two times a day (BID) | RESPIRATORY_TRACT | Status: DC
  Administered 2012-08-15 – 2012-08-22 (×14): 2 via RESPIRATORY_TRACT
  Filled 2012-08-15: qty 6

## 2012-08-15 MED ORDER — FENTANYL CITRATE 0.05 MG/ML IJ SOLN
INTRAMUSCULAR | Status: AC
  Filled 2012-08-15: qty 2

## 2012-08-15 MED ORDER — INSULIN REGULAR BOLUS VIA INFUSION
0.0000 [IU] | Freq: Three times a day (TID) | INTRAVENOUS | Status: DC
  Administered 2012-08-16: 2 [IU] via INTRAVENOUS
  Administered 2012-08-16: 3 [IU] via INTRAVENOUS
  Filled 2012-08-15: qty 10

## 2012-08-15 MED ORDER — ACETAMINOPHEN 500 MG PO TABS
1000.0000 mg | ORAL_TABLET | Freq: Four times a day (QID) | ORAL | Status: DC
  Administered 2012-08-16 – 2012-08-17 (×6): 1000 mg via ORAL
  Filled 2012-08-15 (×10): qty 2

## 2012-08-15 MED ORDER — DEXMEDETOMIDINE HCL IN NACL 200 MCG/50ML IV SOLN
0.1000 ug/kg/h | INTRAVENOUS | Status: DC
  Administered 2012-08-15: 0.2 ug/kg/h via INTRAVENOUS
  Administered 2012-08-15: 0.5 ug/kg/h via INTRAVENOUS
  Administered 2012-08-15: 0.2 ug/kg/h via INTRAVENOUS
  Administered 2012-08-16: 0.3 ug/kg/h via INTRAVENOUS
  Filled 2012-08-15 (×3): qty 50

## 2012-08-15 MED ORDER — MAGNESIUM SULFATE 40 MG/ML IJ SOLN
4.0000 g | Freq: Once | INTRAMUSCULAR | Status: AC
  Administered 2012-08-15: 4 g via INTRAVENOUS
  Filled 2012-08-15: qty 100

## 2012-08-15 MED ORDER — OXYCODONE HCL 5 MG PO TABS
5.0000 mg | ORAL_TABLET | ORAL | Status: DC | PRN
  Administered 2012-08-15 – 2012-08-17 (×9): 10 mg via ORAL
  Filled 2012-08-15 (×9): qty 2

## 2012-08-15 MED ORDER — ACETAMINOPHEN 10 MG/ML IV SOLN
1000.0000 mg | Freq: Once | INTRAVENOUS | Status: AC
  Administered 2012-08-15: 1000 mg via INTRAVENOUS
  Filled 2012-08-15: qty 100

## 2012-08-15 MED ORDER — DEXTROSE 5 % IV SOLN
1.5000 g | Freq: Two times a day (BID) | INTRAVENOUS | Status: AC
  Administered 2012-08-15 – 2012-08-17 (×4): 1.5 g via INTRAVENOUS
  Filled 2012-08-15 (×4): qty 1.5

## 2012-08-15 MED ORDER — BISACODYL 5 MG PO TBEC
10.0000 mg | DELAYED_RELEASE_TABLET | Freq: Every day | ORAL | Status: DC
  Administered 2012-08-16: 10 mg via ORAL
  Filled 2012-08-15: qty 2

## 2012-08-15 MED ORDER — MIDAZOLAM HCL 2 MG/2ML IJ SOLN
INTRAMUSCULAR | Status: AC
  Filled 2012-08-15: qty 2

## 2012-08-15 MED ORDER — NITROGLYCERIN IN D5W 200-5 MCG/ML-% IV SOLN
0.0000 ug/min | INTRAVENOUS | Status: DC
  Administered 2012-08-15: 10 ug/min via INTRAVENOUS
  Administered 2012-08-15: 20 ug/min via INTRAVENOUS

## 2012-08-15 MED ORDER — LACTATED RINGERS IV SOLN: 500.0000 mL | Freq: Once | INTRAVENOUS | Status: AC | PRN

## 2012-08-15 MED ORDER — DOPAMINE-DEXTROSE 3.2-5 MG/ML-% IV SOLN: 0.0000 ug/kg/min | INTRAVENOUS | Status: DC

## 2012-08-15 MED ORDER — SUCCINYLCHOLINE CHLORIDE 20 MG/ML IJ SOLN
INTRAMUSCULAR | Status: DC | PRN
  Administered 2012-08-15: 100 mg via INTRAVENOUS

## 2012-08-15 MED ORDER — MILRINONE IN DEXTROSE 20 MG/100ML IV SOLN
0.1250 ug/kg/min | Freq: Once | INTRAVENOUS | Status: AC
  Administered 2012-08-15: 0.375 ug/kg/min via INTRAVENOUS
  Filled 2012-08-15: qty 100

## 2012-08-15 MED ORDER — ALBUMIN HUMAN 5 % IV SOLN
INTRAVENOUS | Status: DC | PRN
  Administered 2012-08-15: 13:00:00 via INTRAVENOUS

## 2012-08-15 MED ORDER — CHLORHEXIDINE GLUCONATE 4 % EX LIQD: 30.0000 mL | CUTANEOUS | Status: DC

## 2012-08-15 MED ORDER — ASPIRIN 81 MG PO CHEW: 324.0000 mg | CHEWABLE_TABLET | Freq: Every day | ORAL | Status: DC

## 2012-08-15 MED ORDER — MIDAZOLAM HCL 5 MG/5ML IJ SOLN
INTRAMUSCULAR | Status: DC | PRN
  Administered 2012-08-15: 5 mg via INTRAVENOUS
  Administered 2012-08-15: 2 mg via INTRAVENOUS
  Administered 2012-08-15: 3 mg via INTRAVENOUS
  Administered 2012-08-15: 2 mg via INTRAVENOUS
  Administered 2012-08-15: 5 mg via INTRAVENOUS
  Administered 2012-08-15: 2 mg via INTRAVENOUS

## 2012-08-15 MED ORDER — LIDOCAINE HCL (CARDIAC) 20 MG/ML IV SOLN
INTRAVENOUS | Status: DC | PRN
  Administered 2012-08-15: 50 mg via INTRAVENOUS

## 2012-08-15 MED ORDER — SODIUM CHLORIDE 0.45 % IV SOLN: INTRAVENOUS | Status: DC

## 2012-08-15 MED ORDER — MORPHINE SULFATE 2 MG/ML IJ SOLN
2.0000 mg | INTRAMUSCULAR | Status: DC | PRN
  Administered 2012-08-16: 2 mg via INTRAVENOUS
  Administered 2012-08-16 – 2012-08-17 (×9): 4 mg via INTRAVENOUS
  Filled 2012-08-15 (×5): qty 2
  Filled 2012-08-15 (×2): qty 1
  Filled 2012-08-15 (×2): qty 2
  Filled 2012-08-15: qty 1
  Filled 2012-08-15 (×2): qty 2
  Filled 2012-08-15: qty 1
  Filled 2012-08-15: qty 2

## 2012-08-15 MED ORDER — SIMVASTATIN 20 MG PO TABS
20.0000 mg | ORAL_TABLET | Freq: Every day | ORAL | Status: DC
  Administered 2012-08-16 – 2012-08-21 (×6): 20 mg via ORAL
  Filled 2012-08-15 (×8): qty 1

## 2012-08-15 MED ORDER — MILRINONE IN DEXTROSE 20 MG/100ML IV SOLN
0.3000 ug/kg/min | INTRAVENOUS | Status: DC
  Administered 2012-08-15 – 2012-08-16 (×2): 0.3 ug/kg/min via INTRAVENOUS
  Filled 2012-08-15 (×2): qty 100

## 2012-08-15 MED ORDER — MIDAZOLAM HCL 2 MG/2ML IJ SOLN: 2.0000 mg | INTRAMUSCULAR | Status: DC | PRN

## 2012-08-15 MED ORDER — PROPOFOL 10 MG/ML IV BOLUS
INTRAVENOUS | Status: DC | PRN
  Administered 2012-08-15: 125 mg via INTRAVENOUS

## 2012-08-15 SURGICAL SUPPLY — 120 items
ADAPTER CARDIO PERF ANTE/RETRO (ADAPTER) ×3 IMPLANT
ADPR PRFSN 84XANTGRD RTRGD (ADAPTER) ×1
AGENT HMST MTR 8 SURGIFLO (HEMOSTASIS) ×1
ATTRACTOMAT 16X20 MAGNETIC DRP (DRAPES) ×3 IMPLANT
BAG DECANTER FOR FLEXI CONT (MISCELLANEOUS) ×3 IMPLANT
BANDAGE ELASTIC 4 VELCRO ST LF (GAUZE/BANDAGES/DRESSINGS) ×3 IMPLANT
BANDAGE ELASTIC 6 VELCRO ST LF (GAUZE/BANDAGES/DRESSINGS) ×3 IMPLANT
BANDAGE GAUZE ELAST BULKY 4 IN (GAUZE/BANDAGES/DRESSINGS) ×3 IMPLANT
BASKET HEART  (ORDER IN 25'S) (MISCELLANEOUS) ×1
BASKET HEART (ORDER IN 25'S) (MISCELLANEOUS) ×1
BASKET HEART (ORDER IN 25S) (MISCELLANEOUS) ×1 IMPLANT
BLADE STERNUM SYSTEM 6 (BLADE) ×3 IMPLANT
BLADE SURG 10 STRL SS (BLADE) ×2 IMPLANT
BLADE SURG 12 STRL SS (BLADE) ×3 IMPLANT
BLADE SURG ROTATE 9660 (MISCELLANEOUS) ×4 IMPLANT
CANISTER SUCTION 2500CC (MISCELLANEOUS) ×3 IMPLANT
CANNULA AORTIC HI-FLOW 6.5M20F (CANNULA) ×1 IMPLANT
CANNULA GUNDRY RCSP 15FR (MISCELLANEOUS) ×3 IMPLANT
CANNULA VENOUS MAL SGL STG 40 (MISCELLANEOUS) IMPLANT
CANNULAE VENOUS MAL SGL STG 40 (MISCELLANEOUS) ×3
CATH CPB KIT VANTRIGT (MISCELLANEOUS) ×3 IMPLANT
CATH ROBINSON RED A/P 18FR (CATHETERS) ×11 IMPLANT
CATH THORACIC 28FR (CATHETERS) IMPLANT
CATH THORACIC 28FR RT ANG (CATHETERS) IMPLANT
CATH THORACIC 36FR (CATHETERS) IMPLANT
CATH THORACIC 36FR RT ANG (CATHETERS) ×6 IMPLANT
CLIP FOGARTY SPRING 6M (CLIP) ×2 IMPLANT
CLIP TI WIDE RED SMALL 24 (CLIP) ×2 IMPLANT
CLOTH BEACON ORANGE TIMEOUT ST (SAFETY) ×3 IMPLANT
COVER SURGICAL LIGHT HANDLE (MISCELLANEOUS) ×3 IMPLANT
CRADLE DONUT ADULT HEAD (MISCELLANEOUS) ×3 IMPLANT
DRAIN CHANNEL 32F RND 10.7 FF (WOUND CARE) ×3 IMPLANT
DRAPE CARDIOVASCULAR INCISE (DRAPES) ×3
DRAPE SLUSH MACHINE 52X66 (DRAPES) IMPLANT
DRAPE SLUSH/WARMER DISC (DRAPES) ×2 IMPLANT
DRAPE SRG 135X102X78XABS (DRAPES) ×1 IMPLANT
DRSG COVADERM 4X14 (GAUZE/BANDAGES/DRESSINGS) ×3 IMPLANT
ELECT BLADE 4.0 EZ CLEAN MEGAD (MISCELLANEOUS) ×3
ELECT BLADE 6.5 EXT (BLADE) ×3 IMPLANT
ELECT CAUTERY BLADE 6.4 (BLADE) ×3 IMPLANT
ELECT REM PT RETURN 9FT ADLT (ELECTROSURGICAL) ×6
ELECTRODE BLDE 4.0 EZ CLN MEGD (MISCELLANEOUS) ×1 IMPLANT
ELECTRODE REM PT RTRN 9FT ADLT (ELECTROSURGICAL) ×2 IMPLANT
GAUZE XEROFORM 1X8 LF (GAUZE/BANDAGES/DRESSINGS) ×4 IMPLANT
GLOVE BIO SURGEON STRL SZ 6 (GLOVE) ×8 IMPLANT
GLOVE BIO SURGEON STRL SZ 6.5 (GLOVE) ×1 IMPLANT
GLOVE BIO SURGEON STRL SZ7.5 (GLOVE) ×10 IMPLANT
GLOVE BIO SURGEONS STRL SZ 6.5 (GLOVE) ×1
GLOVE BIOGEL PI IND STRL 6 (GLOVE) IMPLANT
GLOVE BIOGEL PI IND STRL 6.5 (GLOVE) IMPLANT
GLOVE BIOGEL PI IND STRL 7.0 (GLOVE) IMPLANT
GLOVE BIOGEL PI INDICATOR 6 (GLOVE) ×6
GLOVE BIOGEL PI INDICATOR 6.5 (GLOVE) ×4
GLOVE BIOGEL PI INDICATOR 7.0 (GLOVE) ×6
GOWN PREVENTION PLUS XLARGE (GOWN DISPOSABLE) ×4 IMPLANT
GOWN STRL NON-REIN LRG LVL3 (GOWN DISPOSABLE) ×18 IMPLANT
HEMOSTAT POWDER SURGIFOAM 1G (HEMOSTASIS) ×9 IMPLANT
HEMOSTAT SURGICEL 2X14 (HEMOSTASIS) ×3 IMPLANT
INSERT FOGARTY XLG (MISCELLANEOUS) ×2 IMPLANT
KIT BASIN OR (CUSTOM PROCEDURE TRAY) ×3 IMPLANT
KIT ROOM TURNOVER OR (KITS) ×3 IMPLANT
KIT SUCTION CATH 14FR (SUCTIONS) ×3 IMPLANT
KIT VASOVIEW W/TROCAR VH 2000 (KITS) ×3 IMPLANT
LEAD PACING MYOCARDI (MISCELLANEOUS) ×3 IMPLANT
MARKER GRAFT CORONARY BYPASS (MISCELLANEOUS) ×9 IMPLANT
NS IRRIG 1000ML POUR BTL (IV SOLUTION) ×17 IMPLANT
PACK OPEN HEART (CUSTOM PROCEDURE TRAY) ×3 IMPLANT
PAD ARMBOARD 7.5X6 YLW CONV (MISCELLANEOUS) ×6 IMPLANT
PENCIL BUTTON HOLSTER BLD 10FT (ELECTRODE) ×3 IMPLANT
PUNCH AORTIC ROTATE 4.0MM (MISCELLANEOUS) IMPLANT
PUNCH AORTIC ROTATE 4.5MM 8IN (MISCELLANEOUS) ×2 IMPLANT
PUNCH AORTIC ROTATE 5MM 8IN (MISCELLANEOUS) IMPLANT
SET CARDIOPLEGIA MPS 5001102 (MISCELLANEOUS) ×2 IMPLANT
SOLUTION ANTI FOG 6CC (MISCELLANEOUS) ×2 IMPLANT
SPOGE SURGIFLO 8M (HEMOSTASIS) ×2
SPONGE GAUZE 4X4 12PLY (GAUZE/BANDAGES/DRESSINGS) ×6 IMPLANT
SPONGE LAP 4X18 X RAY DECT (DISPOSABLE) ×2 IMPLANT
SPONGE SURGIFLO 8M (HEMOSTASIS) IMPLANT
SUT BONE WAX W31G (SUTURE) ×3 IMPLANT
SUT MNCRL AB 4-0 PS2 18 (SUTURE) IMPLANT
SUT PROLENE 3 0 SH DA (SUTURE) IMPLANT
SUT PROLENE 3 0 SH1 36 (SUTURE) IMPLANT
SUT PROLENE 4 0 RB 1 (SUTURE) ×3
SUT PROLENE 4 0 SH DA (SUTURE) ×3 IMPLANT
SUT PROLENE 4-0 RB1 .5 CRCL 36 (SUTURE) ×1 IMPLANT
SUT PROLENE 5 0 C 1 36 (SUTURE) IMPLANT
SUT PROLENE 6 0 C 1 30 (SUTURE) ×2 IMPLANT
SUT PROLENE 7 0 BV 1 (SUTURE) IMPLANT
SUT PROLENE 7 0 DA (SUTURE) IMPLANT
SUT PROLENE 7.0 RB 3 (SUTURE) ×11 IMPLANT
SUT PROLENE 8 0 BV175 6 (SUTURE) ×2 IMPLANT
SUT PROLENE BLUE 7 0 (SUTURE) ×5 IMPLANT
SUT PROLENE POLY MONO (SUTURE) ×4 IMPLANT
SUT SILK  1 MH (SUTURE)
SUT SILK 1 MH (SUTURE) IMPLANT
SUT SILK 2 0 SH CR/8 (SUTURE) ×2 IMPLANT
SUT SILK 3 0 SH CR/8 (SUTURE) ×2 IMPLANT
SUT STEEL 6MS V (SUTURE) ×4 IMPLANT
SUT STEEL STERNAL CCS#1 18IN (SUTURE) IMPLANT
SUT STEEL SZ 6 DBL 3X14 BALL (SUTURE) ×7 IMPLANT
SUT VIC AB 1 CTX 18 (SUTURE) ×6 IMPLANT
SUT VIC AB 1 CTX 36 (SUTURE) ×9
SUT VIC AB 1 CTX36XBRD ANBCTR (SUTURE) ×2 IMPLANT
SUT VIC AB 2-0 CT1 27 (SUTURE) ×3
SUT VIC AB 2-0 CT1 TAPERPNT 27 (SUTURE) IMPLANT
SUT VIC AB 2-0 CTX 27 (SUTURE) IMPLANT
SUT VIC AB 3-0 SH 27 (SUTURE)
SUT VIC AB 3-0 SH 27X BRD (SUTURE) IMPLANT
SUT VIC AB 3-0 X1 27 (SUTURE) ×2 IMPLANT
SUT VICRYL 4-0 PS2 18IN ABS (SUTURE) IMPLANT
SUTURE E-PAK OPEN HEART (SUTURE) ×3 IMPLANT
SYSTEM SAHARA CHEST DRAIN ATS (WOUND CARE) ×3 IMPLANT
TAPE CLOTH SURG 4X10 WHT LF (GAUZE/BANDAGES/DRESSINGS) ×2 IMPLANT
TAPE PAPER 3X10 WHT MICROPORE (GAUZE/BANDAGES/DRESSINGS) ×2 IMPLANT
TOWEL OR 17X24 6PK STRL BLUE (TOWEL DISPOSABLE) ×6 IMPLANT
TOWEL OR 17X26 10 PK STRL BLUE (TOWEL DISPOSABLE) ×6 IMPLANT
TRAY FOLEY IC TEMP SENS 14FR (CATHETERS) ×3 IMPLANT
TUBING INSUFFLATION 10FT LAP (TUBING) ×3 IMPLANT
UNDERPAD 30X30 INCONTINENT (UNDERPADS AND DIAPERS) ×3 IMPLANT
WATER STERILE IRR 1000ML POUR (IV SOLUTION) ×6 IMPLANT

## 2012-08-15 NOTE — Preoperative (Signed)
Beta Blockers   Reason not to administer Beta Blockers:Not Applicable

## 2012-08-15 NOTE — Procedures (Signed)
Extubation Procedure Note  Patient Details:   Name: Vincent Chandler DOB: 02-06-1961 MRN: [id number]   Airway Documentation:   Pt weaned from vent support and placced on 4lpm. sats dropped to 85% quickly so patient then placed on 50% venturi mask. sats improved. Pt ordered on Xopenex treatments. Patient is coughing productively and seems more comfortable. Evaluation  O2 sats: currently acceptable Complications: No apparent complications Patient did tolerate procedure well. Bilateral Breath Sounds: Clear Suctioning: Airway Yes  Malachi Paradise 08/15/2012, 7:22 PM

## 2012-08-15 NOTE — Anesthesia Preprocedure Evaluation (Signed)
Anesthesia Evaluation  Patient identified by MRN, date of birth, ID band Patient awake    Reviewed: Allergy & Precautions, H&P , NPO status , Patient's Chart, lab work & pertinent test results  Airway Mallampati: II  Neck ROM: full    Dental   Pulmonary asthma , sleep apnea , pneumonia -,          Cardiovascular hypertension, + CAD and +CHF     Neuro/Psych  Headaches, Anxiety  Neuromuscular disease    GI/Hepatic hiatal hernia, GERD-  ,  Endo/Other  diabetes, Type 2  Renal/GU      Musculoskeletal   Abdominal   Peds  Hematology   Anesthesia Other Findings   Reproductive/Obstetrics                           Anesthesia Physical Anesthesia Plan  ASA: III  Anesthesia Plan: General   Post-op Pain Management:    Induction: Intravenous  Airway Management Planned: Oral ETT  Additional Equipment: Arterial line, CVP, PA Cath and TEE  Intra-op Plan:   Post-operative Plan: Post-operative intubation/ventilation  Informed Consent: I have reviewed the patients History and Physical, chart, labs and discussed the procedure including the risks, benefits and alternatives for the proposed anesthesia with the patient or authorized representative who has indicated his/her understanding and acceptance.     Plan Discussed with: CRNA and Surgeon  Anesthesia Plan Comments:         Anesthesia Quick Evaluation

## 2012-08-15 NOTE — Progress Notes (Signed)
Patient ID: Vincent Chandler, male   DOB: 30-Nov-1960, 52 y.o.   MRN: [id number]  SICU Evening Rounds:  Hemodynamically stable  Extubated.  CT output low  Urine output ok.  CBC    Component Value Date/Time   WBC 8.1 08/15/2012 1950   WBC 8.8 02/16/2012 1236   RBC 3.77* 08/15/2012 1950   RBC 5.44 02/16/2012 1236   HGB 12.2* 08/15/2012 1950   HGB 17.1 02/16/2012 1236   HCT 36.1* 08/15/2012 1950   HCT 55.8* 02/16/2012 1236   PLT 168 08/15/2012 1950   MCV 95.8 08/15/2012 1950   MCV 102.5* 02/16/2012 1236   MCH 32.4 08/15/2012 1950   MCH 31.4* 02/16/2012 1236   MCHC 33.8 08/15/2012 1950   MCHC 30.6* 02/16/2012 1236   RDW 14.4 08/15/2012 1950   LYMPHSABS 1.3 08/05/2012 1410   MONOABS 0.8 08/05/2012 1410   EOSABS 0.5 08/05/2012 1410   BASOSABS 0.0 08/05/2012 1410    BMET    Component Value Date/Time   NA 140 08/15/2012 1948   K 4.6 08/15/2012 1948   CL 108 08/15/2012 1948   CO2 22 08/13/2012 1307   GLUCOSE 148* 08/15/2012 1948   BUN 13 08/15/2012 1948   CREATININE 0.89 08/15/2012 1950   CALCIUM 9.4 08/13/2012 1307   GFRNONAA >90 08/15/2012 1950   GFRAA >90 08/15/2012 1950    Start CPAP at hs.

## 2012-08-15 NOTE — Progress Notes (Signed)
Utilization review completed.  P.J. Franca Stakes,RN,BSN Case Manager [phone]  

## 2012-08-15 NOTE — Brief Op Note (Signed)
[address]            [place] [address]          [phone]   08/15/2012  11:38 AM  PATIENT:  Vincent Chandler  52 y.o. male  PRE-OPERATIVE DIAGNOSIS:  coronary Artery Disease  POST-OPERATIVE DIAGNOSIS:  coronary Artery Disease  PROCEDURE:  Procedure(s): CORONARY ARTERY BYPASS GRAFTING (CABG)X4 LIMA-LAD; SVG-OM; SVG-RAMUS; SVG-RCA EVH RIGHT LEG  SURGEON:  Surgeon(s): Ivin Poot, MD  PHYSICIAN ASSISTANT: Alyzza Andringa PA-C  ANESTHESIA:   general  PATIENT CONDITION:  ICU - intubated and hemodynamically stable.  PRE-OPERATIVE WEIGHT: 89QJ  COMPLICATIONS: NO KNOWN

## 2012-08-15 NOTE — OR Nursing (Signed)
First call to SICU made at 1232.  Family also notified at this time that patient was off bypass.  Second call to SICU made at 1317

## 2012-08-15 NOTE — Transfer of Care (Signed)
Immediate Anesthesia Transfer of Care Note  Patient: Vincent Chandler  Procedure(s) Performed: Procedure(s) with comments: CORONARY ARTERY BYPASS GRAFTING (CABG) (N/A) - Coronary Artery Bypass Grafting Times Four Using Left Internal Mammary Artery and Right Saphenous Leg Vein Harvested Endoscopically  Patient Location: PACU and SICU  Anesthesia Type:General  Level of Consciousness: unresponsive and Patient remains intubated per anesthesia plan  Airway & Oxygen Therapy: Patient remains intubated per anesthesia plan and Patient placed on Ventilator (see vital sign flow sheet for setting)  Post-op Assessment: Report given to PACU RN and Post -op Vital signs reviewed and stable  Post vital signs: Reviewed and stable  Complications: No apparent anesthesia complications

## 2012-08-15 NOTE — Progress Notes (Signed)
TCTS  The patient was examined and preop studies reviewed. There has been no change from the prior exam and the patient is ready for surgery.  Plan CABG today on T Eddie

## 2012-08-16 ENCOUNTER — Inpatient Hospital Stay (HOSPITAL_COMMUNITY): Payer: BC Managed Care – PPO

## 2012-08-16 LAB — POCT I-STAT 3, ART BLOOD GAS (G3+)
Acid-Base Excess: 1 mmol/L (ref 0.0–2.0)
Acid-base deficit: 1 mmol/L (ref 0.0–2.0)
Bicarbonate: 24.1 mEq/L — ABNORMAL HIGH (ref 20.0–24.0)
Bicarbonate: 25.8 mEq/L — ABNORMAL HIGH (ref 20.0–24.0)
Bicarbonate: 23.2 mEq/L (ref 20.0–24.0)
O2 Saturation: 93 %
O2 Saturation: 100 %
O2 Saturation: 100 %
Patient temperature: 38.1
TCO2: 25 mmol/L (ref 0–100)
TCO2: 27 mmol/L (ref 0–100)
TCO2: 24 mmol/L (ref 0–100)
pCO2 arterial: 38.2 mmHg (ref 35.0–45.0)
pCO2 arterial: 39.3 mmHg (ref 35.0–45.0)
pCO2 arterial: 36.4 mmHg (ref 35.0–45.0)
pH, Arterial: 7.412 (ref 7.350–7.450)
pH, Arterial: 7.426 (ref 7.350–7.450)
pH, Arterial: 7.412 (ref 7.350–7.450)
pO2, Arterial: 68 mmHg — ABNORMAL LOW (ref 80.0–100.0)
pO2, Arterial: 427 mmHg — ABNORMAL HIGH (ref 80.0–100.0)
pO2, Arterial: 341 mmHg — ABNORMAL HIGH (ref 80.0–100.0)

## 2012-08-16 LAB — GLUCOSE, CAPILLARY
Glucose-Capillary: 105 mg/dL — ABNORMAL HIGH (ref 70–99)
Glucose-Capillary: 109 mg/dL — ABNORMAL HIGH (ref 70–99)
Glucose-Capillary: 119 mg/dL — ABNORMAL HIGH (ref 70–99)
Glucose-Capillary: 121 mg/dL — ABNORMAL HIGH (ref 70–99)
Glucose-Capillary: 122 mg/dL — ABNORMAL HIGH (ref 70–99)
Glucose-Capillary: 127 mg/dL — ABNORMAL HIGH (ref 70–99)
Glucose-Capillary: 127 mg/dL — ABNORMAL HIGH (ref 70–99)
Glucose-Capillary: 131 mg/dL — ABNORMAL HIGH (ref 70–99)
Glucose-Capillary: 132 mg/dL — ABNORMAL HIGH (ref 70–99)
Glucose-Capillary: 137 mg/dL — ABNORMAL HIGH (ref 70–99)
Glucose-Capillary: 137 mg/dL — ABNORMAL HIGH (ref 70–99)
Glucose-Capillary: 141 mg/dL — ABNORMAL HIGH (ref 70–99)
Glucose-Capillary: 158 mg/dL — ABNORMAL HIGH (ref 70–99)
Glucose-Capillary: 94 mg/dL (ref 70–99)
Glucose-Capillary: 95 mg/dL (ref 70–99)
Glucose-Capillary: 95 mg/dL (ref 70–99)
Glucose-Capillary: 96 mg/dL (ref 70–99)
Glucose-Capillary: 97 mg/dL (ref 70–99)

## 2012-08-16 LAB — CBC
HCT: 33.8 % — ABNORMAL LOW (ref 39.0–52.0)
HCT: 37.3 % — ABNORMAL LOW (ref 39.0–52.0)
Hemoglobin: 11.8 g/dL — ABNORMAL LOW (ref 13.0–17.0)
Hemoglobin: 12.9 g/dL — ABNORMAL LOW (ref 13.0–17.0)
MCV: 97.4 fL (ref 78.0–100.0)
RBC: 3.54 MIL/uL — ABNORMAL LOW (ref 4.22–5.81)
RBC: 3.83 MIL/uL — ABNORMAL LOW (ref 4.22–5.81)
WBC: 8.8 10*3/uL (ref 4.0–10.5)
WBC: 13.1 10*3/uL — ABNORMAL HIGH (ref 4.0–10.5)

## 2012-08-16 LAB — CREATININE, SERUM
GFR calc Af Amer: 76 mL/min — ABNORMAL LOW (ref >90)
GFR calc non Af Amer: 66 mL/min — ABNORMAL LOW (ref >90)

## 2012-08-16 LAB — POCT I-STAT 4, (NA,K, GLUC, HGB,HCT)
Glucose, Bld: 161 mg/dL — ABNORMAL HIGH (ref 70–99)
Glucose, Bld: 126 mg/dL — ABNORMAL HIGH (ref 70–99)
Glucose, Bld: 162 mg/dL — ABNORMAL HIGH (ref 70–99)
Glucose, Bld: 144 mg/dL — ABNORMAL HIGH (ref 70–99)
Glucose, Bld: 125 mg/dL — ABNORMAL HIGH (ref 70–99)
HCT: 28 % — ABNORMAL LOW (ref 39.0–52.0)
HCT: 20 % — ABNORMAL LOW (ref 39.0–52.0)
HCT: 28 % — ABNORMAL LOW (ref 39.0–52.0)
HCT: 27 % — ABNORMAL LOW (ref 39.0–52.0)
HCT: 25 % — ABNORMAL LOW (ref 39.0–52.0)
Hemoglobin: 9.5 g/dL — ABNORMAL LOW (ref 13.0–17.0)
Hemoglobin: 6.8 g/dL — CL (ref 13.0–17.0)
Hemoglobin: 9.5 g/dL — ABNORMAL LOW (ref 13.0–17.0)
Hemoglobin: 9.2 g/dL — ABNORMAL LOW (ref 13.0–17.0)
Hemoglobin: 8.5 g/dL — ABNORMAL LOW (ref 13.0–17.0)
Potassium: 4.1 mEq/L (ref 3.5–5.1)
Potassium: 3.7 mEq/L (ref 3.5–5.1)
Potassium: 4 mEq/L (ref 3.5–5.1)
Potassium: 5 mEq/L (ref 3.5–5.1)
Potassium: 3.8 mEq/L (ref 3.5–5.1)
Sodium: 137 mEq/L (ref 135–145)
Sodium: 138 mEq/L (ref 135–145)
Sodium: 139 mEq/L (ref 135–145)
Sodium: 137 mEq/L (ref 135–145)
Sodium: 140 mEq/L (ref 135–145)

## 2012-08-16 LAB — POCT I-STAT, CHEM 8
BUN: 14 mg/dL (ref 6–23)
Calcium, Ion: 1.14 mmol/L (ref 1.12–1.23)
Chloride: 103 mEq/L (ref 96–112)
Creatinine, Ser: 1.3 mg/dL (ref 0.50–1.35)
Glucose, Bld: 132 mg/dL — ABNORMAL HIGH (ref 70–99)
HCT: 39 % (ref 39.0–52.0)
Hemoglobin: 13.3 g/dL (ref 13.0–17.0)
Potassium: 4.9 mEq/L (ref 3.5–5.1)
Sodium: 134 mEq/L — ABNORMAL LOW (ref 135–145)
TCO2: 25 mmol/L (ref 0–100)

## 2012-08-16 LAB — MAGNESIUM: Magnesium: 2.2 mg/dL (ref 1.5–2.5)

## 2012-08-16 LAB — BASIC METABOLIC PANEL
BUN: 10 mg/dL (ref 6–23)
CO2: 24 mEq/L (ref 19–32)
Chloride: 104 mEq/L (ref 96–112)
GFR calc Af Amer: >90 mL/min (ref >90)
Glucose, Bld: 124 mg/dL — ABNORMAL HIGH (ref 70–99)
Potassium: 4.3 mEq/L (ref 3.5–5.1)

## 2012-08-16 LAB — PLATELET COUNT: Platelets: 149 K/uL — ABNORMAL LOW (ref 150–400)

## 2012-08-16 MED ORDER — INSULIN DETEMIR 100 UNIT/ML ~~LOC~~ SOLN: 14.0000 [IU] | Freq: Two times a day (BID) | SUBCUTANEOUS | Status: DC

## 2012-08-16 MED ORDER — THIAMINE HCL 100 MG/ML IJ SOLN
100.0000 mg | Freq: Every day | INTRAMUSCULAR | Status: DC
  Administered 2012-08-16 – 2012-08-17 (×2): 100 mg via INTRAVENOUS
  Filled 2012-08-16 (×2): qty 1

## 2012-08-16 MED ORDER — FUROSEMIDE 10 MG/ML IJ SOLN
40.0000 mg | Freq: Two times a day (BID) | INTRAMUSCULAR | Status: AC
  Administered 2012-08-16 – 2012-08-18 (×4): 40 mg via INTRAVENOUS
  Filled 2012-08-16 (×4): qty 4

## 2012-08-16 MED ORDER — METOPROLOL TARTRATE 25 MG PO TABS
25.0000 mg | ORAL_TABLET | Freq: Two times a day (BID) | ORAL | Status: DC
  Administered 2012-08-16 (×2): 25 mg via ORAL
  Filled 2012-08-16 (×4): qty 1

## 2012-08-16 MED ORDER — MIDAZOLAM HCL 2 MG/2ML IJ SOLN
2.0000 mg | Freq: Four times a day (QID) | INTRAMUSCULAR | Status: DC | PRN
  Filled 2012-08-16: qty 2

## 2012-08-16 MED ORDER — INSULIN DETEMIR 100 UNIT/ML ~~LOC~~ SOLN
20.0000 [IU] | Freq: Two times a day (BID) | SUBCUTANEOUS | Status: DC
  Administered 2012-08-16 – 2012-08-18 (×6): 20 [IU] via SUBCUTANEOUS

## 2012-08-16 MED ORDER — MUPIROCIN 2 % EX OINT
1.0000 "application " | TOPICAL_OINTMENT | Freq: Two times a day (BID) | CUTANEOUS | Status: AC
  Administered 2012-08-16 – 2012-08-20 (×10): 1 via NASAL
  Filled 2012-08-16: qty 22

## 2012-08-16 MED ORDER — CHLORHEXIDINE GLUCONATE CLOTH 2 % EX PADS
6.0000 | MEDICATED_PAD | Freq: Every day | CUTANEOUS | Status: DC
  Administered 2012-08-16: 6 via TOPICAL

## 2012-08-16 MED ORDER — INSULIN DETEMIR 100 UNIT/ML ~~LOC~~ SOLN: 14.0000 [IU] | Freq: Every day | SUBCUTANEOUS | Status: DC

## 2012-08-16 MED ORDER — TRAMADOL HCL 50 MG PO TABS
50.0000 mg | ORAL_TABLET | Freq: Four times a day (QID) | ORAL | Status: DC | PRN
  Administered 2012-08-16 – 2012-08-17 (×4): 50 mg via ORAL
  Filled 2012-08-16 (×4): qty 1

## 2012-08-16 MED ORDER — INSULIN ASPART 100 UNIT/ML ~~LOC~~ SOLN
4.0000 [IU] | Freq: Three times a day (TID) | SUBCUTANEOUS | Status: DC
  Administered 2012-08-16 – 2012-08-17 (×2): 4 [IU] via SUBCUTANEOUS

## 2012-08-16 MED ORDER — INSULIN ASPART 100 UNIT/ML ~~LOC~~ SOLN
0.0000 [IU] | SUBCUTANEOUS | Status: DC
  Administered 2012-08-16 – 2012-08-17 (×3): 2 [IU] via SUBCUTANEOUS
  Administered 2012-08-17: 8 [IU] via SUBCUTANEOUS

## 2012-08-16 NOTE — Progress Notes (Signed)
Patient ID: Vincent Chandler, male   DOB: [birth year], 52 y.o.   MRN: [id number]                   [address]            [place] [address]          [phone]     1 Day Post-Op Procedure(s) (LRB): CORONARY ARTERY BYPASS GRAFTING (CABG) (N/A)  Total Length of Stay:  LOS: 1 day  BP 114/88  Pulse 107  Temp(Src) 98.7 F (37.1 C) (Oral)  Resp 23  Wt 227 lb 4.7 oz (103.1 kg)  BMI 33.55 kg/m2  SpO2 94%  .Intake/Output     02/13 0701 - 02/14 0700 02/14 0701 - 02/15 0700   P.O. 690    I.V. (mL/kg) 3951.6 (38.3) 105.7 (1)   Blood 319    IV Piggyback 670 250   Total Intake(mL/kg) 5630.6 (54.6) 355.7 (3.5)   Urine (mL/kg/hr) 3955 (1.6) 70 (0.1)   Blood 600 (0.2)    Chest Tube 380 (0.2) 20 (0)   Total Output 4935 90   Net +695.6 +265.7          . sodium chloride 20 mL/hr at 08/15/12 1600  . nitroGLYCERIN Stopped (08/16/12 0600)  . phenylephrine (NEO-SYNEPHRINE) Adult infusion 0 mcg/min (08/15/12 1500)     Lab Results  Component Value Date   WBC 8.8 08/16/2012   HGB 8.5* 08/16/2012   HCT 25.0* 08/16/2012   PLT 142* 08/16/2012   GLUCOSE 125* 08/16/2012   CHOL 173 06/04/2012   TRIG 110 06/04/2012   HDL 32* 06/04/2012   LDLDIRECT 153.1 11/03/2010   LDLCALC 119* 06/04/2012   ALT 18 08/13/2012   AST 16 08/13/2012   NA 140 08/16/2012   K 3.8 08/16/2012   CL 104 08/16/2012   CREATININE 0.81 08/16/2012   BUN 10 08/16/2012   CO2 24 08/16/2012   TSH 0.85 10/18/2010   PSA 0.24 03/10/2010   INR 1.32 08/15/2012   HGBA1C 8.2* 08/13/2012   MICROALBUR 5.0* 03/10/2010   Stable day Sinus rhythm  [name] MD  Beeper [phone] Office [phone] 08/16/2012 4:04 PM

## 2012-08-16 NOTE — Progress Notes (Signed)
TCTS DAILY PROGRESS NOTE                   [address]            [place], [address]          [phone]      1 Day Post-Op Procedure(s) (LRB): CORONARY ARTERY BYPASS GRAFTING (CABG) (N/A)  Total Length of Stay:  LOS: 1 day   Subjective: Sore, but otherwise feels ok this am.  Stable night.   Objective: Vital signs in last 24 hours: Temp:  [96.8 F (36 C)-100.9 F (38.3 C)] 100.6 F (38.1 C) (02/14 0600) Pulse Rate:  [90-112] 112 (02/14 0600) Cardiac Rhythm:  [-] Sinus tachycardia (02/14 0000) Resp:  [1-31] 31 (02/14 0600) BP: (71-141)/(51-85) 71/52 mmHg (02/14 0600) SpO2:  [87 %-98 %] 87 % (02/14 0600) Arterial Line BP: (103-187)/(42-77) 105/61 mmHg (02/14 0600) FiO2 (%):  [40 %-100 %] 40 % (02/14 0000) Weight:  [227 lb 4.7 oz (103.1 kg)] 227 lb 4.7 oz (103.1 kg) (02/14 0600)  Filed Weights   08/14/12 1300 08/16/12 0600  Weight: 217 lb (98.431 kg) 227 lb 4.7 oz (103.1 kg)  PRE-OPERATIVE WEIGHT: 98kg   Weight change: 10 lb 4.7 oz (4.669 kg)   Hemodynamic parameters for last 24 hours: PAP: (30-50)/(14-31) 46/21 mmHg CO:  [4.4 L/min-10.8 L/min] 10.8 L/min CI:  [2.1 L/min/m2-5 L/min/m2] 5 L/min/m2  Intake/Output from previous day: 02/13 0701 - 02/14 0700 In: 5621.6 [P.O.:690; I.V.:3942.6; Blood:319; IV Piggyback:670] Out: 9147 [Urine:3925; Blood:600; Chest Tube:380]  Intake/Output this shift: Total I/O In: 53.5 [I.V.:3.5; IV Piggyback:50] Out: -    CBGs: 94-148   Current Meds: Scheduled Meds: . acetaminophen  1,000 mg Oral Q6H   Or  . acetaminophen (TYLENOL) oral liquid 160 mg/5 mL  975 mg Per Tube Q6H  . aspirin EC  325 mg Oral Daily   Or  . aspirin  324 mg Per Tube Daily  . bisacodyl  10 mg Oral Daily   Or  . bisacodyl  10 mg Rectal Daily  . budesonide-formoterol  2 puff Inhalation BID  . cefUROXime (ZINACEF)  IV  1.5 g Intravenous Q12H  . docusate sodium  200 mg Oral Daily  . insulin regular  0-10 Units Intravenous TID WC  .  levalbuterol  1.25 mg Nebulization Q6H  . metoprolol tartrate  12.5 mg Oral BID   Or  . metoprolol tartrate  12.5 mg Per Tube BID  . [START ON 08/17/2012] pantoprazole  40 mg Oral Daily  . simvastatin  20 mg Oral q1800  . sodium chloride  3 mL Intravenous Q12H  . vancomycin  1,000 mg Intravenous Q12H   Continuous Infusions: . sodium chloride Stopped (08/15/12 1400)  . sodium chloride 20 mL/hr at 08/15/12 1600  . dexmedetomidine Stopped (08/16/12 0430)  . DOPamine 1.5 mcg/kg/min (08/16/12 0700)  . insulin (NOVOLIN-R) infusion 3.6 Units/hr (08/16/12 0732)  . lactated ringers 60 mL/hr at 08/15/12 1400  . milrinone 0.3 mcg/kg/min (08/16/12 0445)  . nitroGLYCERIN Stopped (08/16/12 0600)  . phenylephrine (NEO-SYNEPHRINE) Adult infusion 0 mcg/min (08/15/12 1500)   PRN Meds:.sodium chloride, albumin human, metoprolol, midazolam, morphine injection, ondansetron (ZOFRAN) IV, oxyCODONE, sodium chloride    Physical Exam: General appearance: alert, cooperative and no distress Heart: regular rate and rhythm, S1, S2 normal, no murmur, click, rub or gallop Lungs: diminished breath sounds bibasilar Extremities: No edema Wound: Dressed and dry    Lab Results: CBC: Recent Labs  08/15/12 1950 08/16/12  0410  WBC 8.1 8.8  HGB 12.2* 11.8*  HCT 36.1* 33.8*  PLT 168 142*   BMET:  Recent Labs  08/13/12 1307  08/15/12 1948 08/15/12 1950 08/16/12 0410  NA 137  < > 140  --  136  K 4.2  < > 4.6  --  4.3  CL 102  --  108  --  104  CO2 22  --   --   --  24  GLUCOSE 168*  < > 148*  --  124*  BUN 15  --  13  --  10  CREATININE 0.96  --  1.00 0.89 0.81  CALCIUM 9.4  --   --   --  7.9*  < > = values in this interval not displayed.  PT/INR:  Recent Labs  08/15/12 1414  LABPROT 16.1*  INR 1.32     Radiology: Dg Chest Portable 1 View  08/15/2012  *RADIOLOGY REPORT*  Clinical Data: CABG  PORTABLE CHEST - 1 VIEW  Comparison: 08/13/2012  Findings: Interval CABG.  Endotracheal tube in good  position.  Gordy Councilman catheter in the right pulmonary artery.  NG in the stomach. Left chest tube in place.  Negative for pneumothorax.  Bilateral airspace disease suggestive of pulmonary edema.  Mild atelectasis in the lung bases.  IMPRESSION: Bilateral airspace disease most consistent with pulmonary edema. Mild bibasilar atelectasis.   Original Report Authenticated By: Carl Best, M.D.      Assessment/Plan: S/P Procedure(s) (LRB): CORONARY ARTERY BYPASS GRAFTING (CABG) (N/A)  CV- tachy low 100s this am.  Wean and d/c dopamine and start low dose beta blocker.  Titrate up as tolerated.  Vol overload- diurese.  DM- wean insulin gtt and start Lantus.  Mobilize, d/c lines and tubes, routine postop day 1 progression.     Vincent Chandler H 08/16/2012 8:09 AM

## 2012-08-16 NOTE — Significant Event (Addendum)
No dinner tray delivered to patient. Inquired dietary service-stated that ticket was not printed and will print ticket now. Tray to be delivered to patient.

## 2012-08-16 NOTE — Anesthesia Postprocedure Evaluation (Signed)
  Anesthesia Post-op Note  Patient: Vincent Chandler  Procedure(s) Performed: Procedure(s) with comments: CORONARY ARTERY BYPASS GRAFTING (CABG) (N/A) - Coronary Artery Bypass Grafting Times Four Using Left Internal Mammary Artery and Right Saphenous Leg Vein Harvested Endoscopically  Patient Location: SICU  Anesthesia Type:General  Level of Consciousness: awake, alert , oriented, patient cooperative and responds to stimulation  Airway and Oxygen Therapy: Patient Spontanous Breathing and Patient connected to nasal cannula oxygen  Post-op Pain: none  Post-op Assessment: Post-op Vital signs reviewed and Adequate PO intake  Post-op Vital Signs: Reviewed and stable  Complications: No apparent anesthesia complications

## 2012-08-16 NOTE — Op Note (Signed)
[name], [name]:  [id number]  MEDICAL RECORD NO.:  [id number]  LOCATION:  2312                         FACILITY:  [facility]  PHYSICIAN:  Ivin Poot, M.D.  DATE OF BIRTH:  [birth year]  DATE OF PROCEDURE:  08/15/2012 DATE OF DISCHARGE:                              OPERATIVE REPORT   OPERATION: 1. Coronary artery bypass grafting x4 (left internal mammary artery to     LAD, saphenous vein graft to first diagonal, saphenous vein graft     to obtuse marginal, saphenous vein graft to distal right coronary     artery). 2. Endoscopic harvest of right leg greater saphenous vein.  SURGEON:  Ivin Poot, M.D.  ASSISTANT:  John Giovanni, PA-C.  ANESTHESIA:  General by Dr. Albertha Ghee.  PREOPERATIVE DIAGNOSES: 1. Ischemic cardiomyopathy, ejection fraction of 30%. 2. Multivessel coronary artery disease. 3. Status post stent to the right coronary artery, occluded.  POSTOPERATIVE DIAGNOSES: 1. Ischemic cardiomyopathy, ejection fraction of 30%. 2. Multivessel coronary artery disease. 3. Status post stent to the right coronary artery, occluded.  INDICATIONS:  The patient is a 52 year old obese Caucasian male, nonsmoker, but with a history of alcohol abuse, and history of low ejection fraction for the past few years.  He previously underwent a PCI with a stent to the mid RCA at the time of a DMI.  He recently was evaluated by his cardiologist, Dr. Caryl Comes and had developed progressive dyspnea on exertion with some chest discomfort, and a tachycardia. Cardiac catheterizations by Dr. Hali Balgobin [place] demonstrated high-grade LAD stenosis of 90%, chronic occlusion of the RCA, and an 80% stenosis of a OM.  Because of his progressive symptoms and severe three-vessel coronary anatomy, he is felt to be candidate for surgical coronary revascularization.  He is a diabetic and his A1c was 9.2.  Prior to surgery, I examined the patient in the office and reviewed  the results of cardiac catheterization with the patient and his sister.  I discussed the indications and expected benefits of coronary artery bypass grafting for treatment of his severe coronary artery disease.  I reviewed the alternatives to surgery as well.  I discussed with the patient the major aspects of the operation including the location of the surgical incisions, the use of general anesthesia and cardiopulmonary bypass, and expected postoperative hospital recovery.  I discussed with the patient the risks to him of coronary artery bypass surgery including risks of stroke, MI, bleeding, infection, pneumonia, and death.  The patient understood due to his history of diabetes and obesity that he would be at increased risk for wound infection.  After reviewing all of these issues, he demonstrated his understanding and agreed to proceed with surgery under what I felt was an informed consent.  OPERATIVE FINDINGS: 1. Good quality conduit. 2. Difficult exposure of the heart due to patient's short stature and     obesity and LV dilatation. 3. Distal RCA with marginal vessel quality but the LAD diagonal and OM     with adequate vessel quality.  OPERATIVE PROCEDURE:  The patient was brought to the operating room and placed supine on the operating table.  General anesthesia was induced  under invasive hemodynamic monitoring.  The chest, abdomen, and legs were prepped with Betadine and draped as a sterile field.  A proper time- out was performed.  A sternal incision was made as a saphenous vein was harvested endoscopically from the right leg.  The left internal mammary artery was harvested as a pedicle graft from its origin at the subclavian vessels.  It was 1.5-mm vessel with excellent flow.  Sternal retractor was placed using the deep blades due to the patient's obese body habitus.  The pericardium was opened and suspended.  Pursestrings were placed in the ascending aorta and right atrium  and heparin was administered.  After the vein was harvested, the patient was cannulated and placed on cardiopulmonary bypass.  The coronary arteries were identified for grafting and the mammary artery and vein grafts were prepared for the distal anastomoses.  Cardioplegia catheters were placed for both antegrade aortic and retrograde coronary sinus cardioplegia and the patient was cooled to 32 degrees.  The aortic crossclamp was applied.  850 mL of cold blood cardioplegia was delivered in split doses to the aortic root into the coronary sinus with good cardioplegic arrest and septal temperature dropping less than 14 degrees.  Cardioplegia was delivered every 20 minutes while the crossclamp was in place.  The distal coronary anastomoses were then performed.  The first distal anastomosis was the distal RCA.  This was a chronically occluded vessel. It was 1.3 mm in diameter.  There were some diffuse disease but the probe passed easily down the posterolateral branch.  The reverse saphenous vein was sewn end-to-side with a running 7-0 Prolene with good flow through the graft.  The second distal anastomosis was the OM branch of the circumflex.  This was an 1.5-mm vessel but difficult to expose due to the patient's enlarged heart.  We were fairly high up in the AV groove.  A reverse saphenous vein was sewn end-to-side with running 7-0 Prolene with excellent flow through the graft.  Cardioplegia was redosed.  The third distal anastomosis was first diagonal with LAD. There was an ostial 70% stenosis.  A reverse saphenous vein was sewn to this 1.5-mm vessel with running 7-0 Prolene, there was good flow through the graft.  Cardioplegia was redosed.  The fourth distal anastomosis was the distal 3rd of the LAD.  There was some distal disease went beyond that.  There is an 1.5-mm vessel.  The left IMA pedicle was brought through an opening in the left lateral pericardium, it was brought down onto the  LAD and sewn end-to-side with a running 8-0 Prolene.  There was good flow through the anastomosis after briefly releasing the pedicle bulldog on the mammary artery.  The bulldog was reapplied and the pedicle was secured to the epicardium.  Cardioplegia was redosed.  All the crossclamp was still in place.  The 3 proximal vein anastomoses were performed on the ascending aorta with a 4.5-mm punch with running 7- 0 Prolene.  Prior to removing the crossclamp, air was vented from the coronaries with a dose of retrograde warm blood cardioplegia.  The crossclamp was then removed and the heart resumed a spontaneous rhythm.  The vein grafts were de-aired and opened in each a good flow. Hemostasis was documented at the proximal distal sites.  The patient was rewarmed and reperfused.  Temporary pacing wires were applied on the right atrium and right ventricle.  The lungs were expanded.  The ventilator was resumed.  The patient was then weaned off bypass on  low- dose dopamine and milrinone due to his low ejection fraction. Hemodynamics were stable off bypass.  Cardiac output was 4.5-5.0 L/minute.  Protamine was administered and the cannulas were removed. The ACT was reversed to baseline.  The mediastinum was irrigated.  The superior mediastinal fat was closed over the aorta.  Then, anterior mediastinal and left pleural chest tube were placed and brought through separate incisions.  The sternum was closed with interrupted wire.  The pectoralis fascia was closed with interrupted #1 Vicryl.  The subcutaneous and skin layers were closed in running Vicryl and sterile dressings were applied.  Total cardiopulmonary bypass time was 140 minutes.     Ivin Poot, M.D.     PV/MEDQ  D:  08/15/2012  T:  08/16/2012  Job:  [id number]  cc:   Deboraha Sprang, MD, [facility] Nieve Rojero M. [place], M.D. Rosalita Chessman, DO

## 2012-08-16 NOTE — Progress Notes (Signed)
1 Day Post-Op Procedure(s) (LRB): CORONARY ARTERY BYPASS GRAFTING (CABG) (N/A) Subjective:  3V CAD, old DMI, EF .30 Hx ETOH abuse, now reformed DM on metformin Stable post CABG- weaning mil/dopa -  NSR    Objective: Vital signs in last 24 hours: Temp:  [96.8 F (36 C)-100.9 F (38.3 C)] 99.3 F (37.4 C) (02/14 0900) Pulse Rate:  [90-114] 114 (02/14 0900) Cardiac Rhythm:  [-] Sinus tachycardia (02/14 0730) Resp:  [1-31] 24 (02/14 0900) BP: (71-142)/(51-90) 142/90 mmHg (02/14 0900) SpO2:  [87 %-98 %] 96 % (02/14 0900) Arterial Line BP: (103-187)/(42-77) 148/74 mmHg (02/14 0900) FiO2 (%):  [40 %-100 %] 40 % (02/14 0000) Weight:  [227 lb 4.7 oz (103.1 kg)] 227 lb 4.7 oz (103.1 kg) (02/14 0600)  Hemodynamic parameters for last 24 hours: PAP: (30-55)/(14-31) 48/28 mmHg CO:  [4.4 L/min-10.8 L/min] 10.8 L/min CI:  [2.1 L/min/m2-5 L/min/m2] 5 L/min/m2  Intake/Output from previous day: 02/13 0701 - 02/14 0700 In: 5621.6 [P.O.:690; I.V.:3942.6; Blood:319; IV Piggyback:670] Out: 3267 [Urine:3955; Blood:600; Chest Tube:380] Intake/Output this shift: Total I/O In: 300.3 [I.V.:50.3; IV Piggyback:250] Out: 40 [Urine:40]  EXAM  Lungs clear extrem warm Neuro intact  Lab Results:  Recent Labs  08/15/12 1950 08/16/12 0410  WBC 8.1 8.8  HGB 12.2* 11.8*  HCT 36.1* 33.8*  PLT 168 142*   BMET:  Recent Labs  08/13/12 1307  08/15/12 1948 08/15/12 1950 08/16/12 0410  NA 137  < > 140  --  136  K 4.2  < > 4.6  --  4.3  CL 102  --  108  --  104  CO2 22  --   --   --  24  GLUCOSE 168*  < > 148*  --  124*  BUN 15  --  13  --  10  CREATININE 0.96  --  1.00 0.89 0.81  CALCIUM 9.4  --   --   --  7.9*  < > = values in this interval not displayed.  PT/INR:  Recent Labs  08/15/12 1414  LABPROT 16.1*  INR 1.32   ABG    Component Value Date/Time   PHART 7.412 08/16/2012 0546   HCO3 24.1* 08/16/2012 0546   TCO2 25 08/16/2012 0546   ACIDBASEDEF 2.0 08/15/2012 1832   O2SAT  93.0 08/16/2012 0546   CBG (last 3)   Recent Labs  08/16/12 0413 08/16/12 0520 08/16/12 0623  GLUCAP 109* 127* 122*    Assessment/Plan: S/P Procedure(s) (LRB): CORONARY ARTERY BYPASS GRAFTING (CABG) (N/A) See progression orders Levemir+ SSI for DM   LOS: 1 day    VAN TRIGT III,PETER 08/16/2012

## 2012-08-17 ENCOUNTER — Other Ambulatory Visit: Payer: Self-pay | Admitting: Family Medicine

## 2012-08-17 ENCOUNTER — Inpatient Hospital Stay (HOSPITAL_COMMUNITY): Payer: BC Managed Care – PPO

## 2012-08-17 DIAGNOSIS — E1165 Type 2 diabetes mellitus with hyperglycemia: Secondary | ICD-10-CM

## 2012-08-17 DIAGNOSIS — IMO0001 Reserved for inherently not codable concepts without codable children: Secondary | ICD-10-CM

## 2012-08-17 LAB — GLUCOSE, CAPILLARY
Glucose-Capillary: 148 mg/dL — ABNORMAL HIGH (ref 70–99)
Glucose-Capillary: 217 mg/dL — ABNORMAL HIGH (ref 70–99)
Glucose-Capillary: 158 mg/dL — ABNORMAL HIGH (ref 70–99)
Glucose-Capillary: 202 mg/dL — ABNORMAL HIGH (ref 70–99)
Glucose-Capillary: 154 mg/dL — ABNORMAL HIGH (ref 70–99)
Glucose-Capillary: 130 mg/dL — ABNORMAL HIGH (ref 70–99)

## 2012-08-17 LAB — BASIC METABOLIC PANEL
BUN: 18 mg/dL (ref 6–23)
CO2: 27 mEq/L (ref 19–32)
Calcium: 8.4 mg/dL (ref 8.4–10.5)
Chloride: 97 mEq/L (ref 96–112)
Creatinine, Ser: 1.27 mg/dL (ref 0.50–1.35)
GFR calc Af Amer: 74 mL/min — ABNORMAL LOW (ref >90)
GFR calc non Af Amer: 64 mL/min — ABNORMAL LOW (ref >90)
Glucose, Bld: 163 mg/dL — ABNORMAL HIGH (ref 70–99)
Potassium: 4.6 mEq/L (ref 3.5–5.1)
Sodium: 132 mEq/L — ABNORMAL LOW (ref 135–145)

## 2012-08-17 LAB — CBC
HCT: 35.1 % — ABNORMAL LOW (ref 39.0–52.0)
Hemoglobin: 11.7 g/dL — ABNORMAL LOW (ref 13.0–17.0)
MCH: 32.6 pg (ref 26.0–34.0)
MCHC: 33.3 g/dL (ref 30.0–36.0)
MCV: 97.8 fL (ref 78.0–100.0)
Platelets: 134 10*3/uL — ABNORMAL LOW (ref 150–400)
RBC: 3.59 MIL/uL — ABNORMAL LOW (ref 4.22–5.81)
RDW: 14.5 % (ref 11.5–15.5)
WBC: 11.2 10*3/uL — ABNORMAL HIGH (ref 4.0–10.5)

## 2012-08-17 LAB — TSH: TSH: 2.025 u[IU]/mL (ref 0.350–4.500)

## 2012-08-17 MED ORDER — SODIUM CHLORIDE 0.9 % IV SOLN
250.0000 mL | INTRAVENOUS | Status: DC | PRN
  Administered 2012-08-18: 500 mL via INTRAVENOUS

## 2012-08-17 MED ORDER — BISACODYL 10 MG RE SUPP: 10.0000 mg | Freq: Every day | RECTAL | Status: DC | PRN

## 2012-08-17 MED ORDER — BISACODYL 5 MG PO TBEC
10.0000 mg | DELAYED_RELEASE_TABLET | Freq: Every day | ORAL | Status: DC | PRN
  Administered 2012-08-20: 10 mg via ORAL
  Filled 2012-08-17 (×2): qty 2

## 2012-08-17 MED ORDER — AMIODARONE HCL IN DEXTROSE 360-4.14 MG/200ML-% IV SOLN
1.0000 mg/min | INTRAVENOUS | Status: AC
  Administered 2012-08-17 (×2): 1 mg/min via INTRAVENOUS
  Filled 2012-08-17 (×3): qty 200

## 2012-08-17 MED ORDER — AMIODARONE HCL IN DEXTROSE 360-4.14 MG/200ML-% IV SOLN
0.5000 mg/min | INTRAVENOUS | Status: AC
  Administered 2012-08-17: 0.5 mg/min via INTRAVENOUS
  Filled 2012-08-17 (×2): qty 200

## 2012-08-17 MED ORDER — OXYCODONE HCL 5 MG PO TABS
5.0000 mg | ORAL_TABLET | ORAL | Status: DC | PRN
  Administered 2012-08-17 – 2012-08-22 (×31): 10 mg via ORAL
  Filled 2012-08-17 (×30): qty 2

## 2012-08-17 MED ORDER — ENOXAPARIN SODIUM 30 MG/0.3ML ~~LOC~~ SOLN
30.0000 mg | SUBCUTANEOUS | Status: DC
  Administered 2012-08-17 – 2012-08-22 (×5): 30 mg via SUBCUTANEOUS
  Filled 2012-08-17 (×6): qty 0.3

## 2012-08-17 MED ORDER — AMIODARONE LOAD VIA INFUSION
150.0000 mg | Freq: Once | INTRAVENOUS | Status: AC
  Administered 2012-08-17: 150 mg via INTRAVENOUS
  Filled 2012-08-17: qty 83.34

## 2012-08-17 MED ORDER — GUAIFENESIN ER 600 MG PO TB12
600.0000 mg | ORAL_TABLET | Freq: Two times a day (BID) | ORAL | Status: DC | PRN
  Filled 2012-08-17: qty 1

## 2012-08-17 MED ORDER — AMIODARONE HCL 200 MG PO TABS: 200.0000 mg | ORAL_TABLET | Freq: Every day | ORAL | Status: DC

## 2012-08-17 MED ORDER — AMIODARONE HCL 200 MG PO TABS
200.0000 mg | ORAL_TABLET | Freq: Two times a day (BID) | ORAL | Status: DC
  Administered 2012-08-17 – 2012-08-22 (×10): 200 mg via ORAL
  Filled 2012-08-17 (×11): qty 1

## 2012-08-17 MED ORDER — VITAMIN B-1 100 MG PO TABS
100.0000 mg | ORAL_TABLET | Freq: Every day | ORAL | Status: AC
  Administered 2012-08-18 – 2012-08-19 (×2): 100 mg via ORAL
  Filled 2012-08-17 (×2): qty 1

## 2012-08-17 MED ORDER — POTASSIUM CHLORIDE CRYS ER 20 MEQ PO TBCR
20.0000 meq | EXTENDED_RELEASE_TABLET | Freq: Every day | ORAL | Status: AC
  Administered 2012-08-17 – 2012-08-19 (×3): 20 meq via ORAL
  Filled 2012-08-17 (×4): qty 1

## 2012-08-17 MED ORDER — MOVING RIGHT ALONG BOOK
Freq: Once | Status: AC
  Administered 2012-08-17: 10:00:00
  Filled 2012-08-17: qty 1

## 2012-08-17 MED ORDER — DOCUSATE SODIUM 100 MG PO CAPS
200.0000 mg | ORAL_CAPSULE | Freq: Every day | ORAL | Status: DC
  Administered 2012-08-17 – 2012-08-22 (×6): 200 mg via ORAL
  Filled 2012-08-17 (×6): qty 2

## 2012-08-17 MED ORDER — PANTOPRAZOLE SODIUM 40 MG PO TBEC
40.0000 mg | DELAYED_RELEASE_TABLET | Freq: Every day | ORAL | Status: DC
  Administered 2012-08-18 – 2012-08-22 (×5): 40 mg via ORAL
  Filled 2012-08-17 (×5): qty 1

## 2012-08-17 MED ORDER — SODIUM CHLORIDE 0.9 % IJ SOLN: 3.0000 mL | INTRAMUSCULAR | Status: DC | PRN

## 2012-08-17 MED ORDER — METOPROLOL TARTRATE 12.5 MG HALF TABLET
12.5000 mg | ORAL_TABLET | Freq: Two times a day (BID) | ORAL | Status: DC
  Administered 2012-08-17 – 2012-08-18 (×4): 12.5 mg via ORAL
  Filled 2012-08-17 (×6): qty 1

## 2012-08-17 MED ORDER — LISINOPRIL 2.5 MG PO TABS
2.5000 mg | ORAL_TABLET | Freq: Every day | ORAL | Status: DC
  Administered 2012-08-17 – 2012-08-19 (×3): 2.5 mg via ORAL
  Filled 2012-08-17 (×4): qty 1

## 2012-08-17 MED ORDER — METFORMIN HCL ER 500 MG PO TB24
1000.0000 mg | ORAL_TABLET | Freq: Every day | ORAL | Status: DC
  Administered 2012-08-18 – 2012-08-19 (×2): 1000 mg via ORAL
  Filled 2012-08-17 (×3): qty 2

## 2012-08-17 MED ORDER — INSULIN ASPART 100 UNIT/ML ~~LOC~~ SOLN
0.0000 [IU] | Freq: Three times a day (TID) | SUBCUTANEOUS | Status: DC
  Administered 2012-08-17: 8 [IU] via SUBCUTANEOUS
  Administered 2012-08-17 – 2012-08-22 (×10): 2 [IU] via SUBCUTANEOUS

## 2012-08-17 MED ORDER — SODIUM CHLORIDE 0.9 % IJ SOLN
3.0000 mL | Freq: Two times a day (BID) | INTRAMUSCULAR | Status: DC
  Administered 2012-08-17 – 2012-08-21 (×9): 3 mL via INTRAVENOUS

## 2012-08-17 MED ORDER — LEVALBUTEROL HCL 1.25 MG/0.5ML IN NEBU
1.2500 mg | INHALATION_SOLUTION | Freq: Four times a day (QID) | RESPIRATORY_TRACT | Status: DC | PRN
  Administered 2012-08-20: 1.25 mg via RESPIRATORY_TRACT
  Filled 2012-08-17: qty 0.5

## 2012-08-17 NOTE — Progress Notes (Signed)
Patient ID: [name], male   DOB: Nov 08, 1960, 52 y.o.   MRN: [id number] TCTS DAILY PROGRESS NOTE                   [address]            [place] [address]          [phone]      2 Days Post-Op Procedure(s) (LRB): CORONARY ARTERY BYPASS GRAFTING (CABG) (N/A)  Total Length of Stay:  LOS: 2 days   Subjective: Feels better today, walked around unit  Objective: Vital signs in last 24 hours: Temp:  [97.4 F (36.3 C)-98.8 F (37.1 C)] 98.2 F (36.8 C) (02/15 0838) Pulse Rate:  [88-111] 95 (02/15 0700) Cardiac Rhythm:  [-] Atrial flutter (02/14 1728) Resp:  [8-32] 14 (02/15 0700) BP: (108-162)/(69-90) 110/77 mmHg (02/15 0700) SpO2:  [91 %-100 %] 100 % (02/15 0820) Arterial Line BP: (111-144)/(65-78) 111/65 mmHg (02/14 1130) FiO2 (%):  [40 %] 40 % (02/15 0248) Weight:  [228 lb 13.4 oz (103.8 kg)] 228 lb 13.4 oz (103.8 kg) (02/15 0500)  Filed Weights   08/14/12 1300 08/16/12 0600 08/17/12 0500  Weight: 217 lb (98.431 kg) 227 lb 4.7 oz (103.1 kg) 228 lb 13.4 oz (103.8 kg)    Weight change: 1 lb 8.7 oz (0.7 kg)   Hemodynamic parameters for last 24 hours: PAP: (54-58)/(31-37) 58/37 mmHg  Intake/Output from previous day: 02/14 0701 - 02/15 0700 In: 2211.7 [P.O.:1440; I.V.:271.7; IV Piggyback:500] Out: 830 [Urine:810; Chest Tube:20]  Intake/Output this shift: Total I/O In: 70 [I.V.:20; IV Piggyback:50] Out: 40 [Urine:40]  Current Meds: Scheduled Meds: . acetaminophen  1,000 mg Oral Q6H  . aspirin EC  325 mg Oral Daily  . bisacodyl  10 mg Oral Daily   Or  . bisacodyl  10 mg Rectal Daily  . budesonide-formoterol  2 puff Inhalation BID  . Chlorhexidine Gluconate Cloth  6 each Topical Daily  . docusate sodium  200 mg Oral Daily  . furosemide  40 mg Intravenous BID  . insulin aspart  0-24 Units Subcutaneous Q4H  . insulin aspart  4 Units Subcutaneous TID WC  . insulin detemir  20 Units Subcutaneous BID  . levalbuterol  1.25 mg Nebulization  Q6H  . metoprolol tartrate  25 mg Oral BID  . mupirocin ointment  1 application Nasal BID  . pantoprazole  40 mg Oral Daily  . simvastatin  20 mg Oral q1800  . sodium chloride  3 mL Intravenous Q12H  . thiamine  100 mg Intravenous Daily  . vancomycin  1,000 mg Intravenous Q12H   Continuous Infusions: . sodium chloride 20 mL/hr at 08/16/12 2007  . nitroGLYCERIN Stopped (08/16/12 0600)  . phenylephrine (NEO-SYNEPHRINE) Adult infusion 0 mcg/min (08/15/12 1500)   PRN Meds:.sodium chloride, metoprolol, midazolam, morphine injection, ondansetron (ZOFRAN) IV, oxyCODONE, sodium chloride, traMADol  General appearance: alert, cooperative and no distress Neurologic: intact Heart: regular rate and rhythm, S1, S2 normal, no murmur, click, rub or gallop and normal apical impulse Lungs: clear to auscultation bilaterally and normal percussion bilaterally Abdomen: soft, non-tender; bowel sounds normal; no masses,  no organomegaly Extremities: extremities normal, atraumatic, no cyanosis or edema and Homans sign is negative, no sign of DVT Wound: sternum stable  Lab Results: CBC: Recent Labs  08/16/12 1625 08/16/12 1629 08/17/12 0400  WBC 13.1*  --  11.2*  HGB 12.9* 13.3 11.7*  HCT 37.3* 39.0 35.1*  PLT 141*  --  134*  BMET:  Recent Labs  08/16/12 0410  08/16/12 1629 08/17/12 0400  NA 136  < > 134* 132*  K 4.3  < > 4.9 4.6  CL 104  --  103 97  CO2 24  --   --  27  GLUCOSE 124*  < > 132* 163*  BUN 10  --  14 18  CREATININE 0.81  < > 1.30 1.27  CALCIUM 7.9*  --   --  8.4  < > = values in this interval not displayed.  PT/INR:  Recent Labs  08/15/12 1414  LABPROT 16.1*  INR 1.32   Radiology: Dg Chest Port 1 View  08/17/2012  *RADIOLOGY REPORT*  Clinical Data: Postop CABG  PORTABLE CHEST - 1 VIEW  Comparison: Yesterday  Findings: Moderate cardiomegaly stable.  Swan-Ganz removed with the introducer left in place.  Chest tube removed without ensuing pneumothorax.  Lungs remain under  aerated with scattered atelectasis. Diffuse edema has resolved.  IMPRESSION: Chest tube removed without pneumothorax.  Scattered atelectasis. Resolved edema.   Original Report Authenticated By: Marybelle Killings, M.D.    Dg Chest Portable 1 View In Am  08/16/2012  *RADIOLOGY REPORT*  Clinical Data: Postop  PORTABLE CHEST - 1 VIEW  Comparison: 08/15/2012  Findings: Endotracheal and NG tubes removed.  Stable left chest tube and Swan-Ganz catheter without pneumothorax.  Vascular congestion and edema are improved.  Low volumes persist.  IMPRESSION: Extubated.  Improved vascular congestion and edema.   Original Report Authenticated By: Marybelle Killings, M.D.    Dg Chest Portable 1 View  08/15/2012  *RADIOLOGY REPORT*  Clinical Data: CABG  PORTABLE CHEST - 1 VIEW  Comparison: 08/13/2012  Findings: Interval CABG.  Endotracheal tube in good position.  Gordy Councilman catheter in the right pulmonary artery.  NG in the stomach. Left chest tube in place.  Negative for pneumothorax.  Bilateral airspace disease suggestive of pulmonary edema.  Mild atelectasis in the lung bases.  IMPRESSION: Bilateral airspace disease most consistent with pulmonary edema. Mild bibasilar atelectasis.   Original Report Authenticated By: Carl Best, M.D.      Assessment/Plan: S/P Procedure(s) (LRB): CORONARY ARTERY BYPASS GRAFTING (CABG) (N/A) Mobilize Diuresis Diabetes control Plan for transfer to step-down: see transfer orders     Esme Freund B 08/17/2012 9:04 AM

## 2012-08-17 NOTE — Progress Notes (Signed)
  Amiodarone Drug - Drug Interaction Consult Note  Recommendations: no significant drug-drug intxns with current medications regimens.  Amiodarone is metabolized by the cytochrome P450 system and therefore has the potential to cause many drug interactions. Amiodarone has an average plasma half-life of 50 days (range 20 to 100 days).   There is potential for drug interactions to occur several weeks or months after stopping treatment and the onset of drug interactions may be slow after initiating amiodarone.   _0  Statins: Increased risk of myopathy. Simvastatin- restrict dose to 87m daily. Other statins: counsel patients to report any muscle pain or weakness immediately.  _1  Anticoagulants: Amiodarone can increase anticoagulant effect. Consider warfarin dose reduction. Patients should be monitored closely and the dose of anticoagulant altered accordingly, remembering that amiodarone levels take several weeks to stabilize.  _2  Antiepileptics: Amiodarone can increase plasma concentration of phenytoin, the dose should be reduced. Note that small changes in phenytoin dose can result in large changes in levels. Monitor patient and counsel on signs of toxicity.  _3  Beta blockers: increased risk of bradycardia, AV block and myocardial depression. Sotalol - avoid concomitant use.  _4   Calcium channel blockers (diltiazem and verapamil): increased risk of bradycardia, AV block and myocardial depression.  _5   Cyclosporine: Amiodarone increases levels of cyclosporine. Reduced dose of cyclosporine is recommended.  _6  Digoxin dose should be halved when amiodarone is started.   _7  Diuretics: increased risk of cardiotoxicity if hypokalemia occurs.  _8  Oral hypoglycemic agents (glyburide, glipizide, glimepiride): increased risk of hypoglycemia. Patient's glucose levels should be monitored closely when initiating amiodarone therapy.   _9  Drugs that prolong the QT interval:  Torsades de pointes risk may be  increased with concurrent use - avoid if possible.  Monitor QTc, also keep magnesium/potassium WNL if concurrent therapy can't be avoided. .Marland KitchenAntibiotics: e.g. fluoroquinolones, erythromycin. . Antiarrhythmics: e.g. quinidine, procainamide, disopyramide, sotalol. . Antipsychotics: e.g. phenothiazines, haloperidol.  . Lithium, tricyclic antidepressants, and methadone. Thank You,  PLynelle Doctor 08/17/2012 11:54 AM

## 2012-08-17 NOTE — Progress Notes (Signed)
Patient ID: [name], male   DOB: Jul 29, 1960, 52 y.o.   MRN: [id number]                   [address]            [place] [address]          [phone]     2 Days Post-Op Procedure(s) (LRB): CORONARY ARTERY BYPASS GRAFTING (CABG) (N/A)  Total Length of Stay:  LOS: 2 days  BP 111/76  Pulse 80  Temp(Src) 97.7 F (36.5 C) (Oral)  Resp 13  Wt 228 lb 13.4 oz (103.8 kg)  BMI 33.78 kg/m2  SpO2 94%  .Intake/Output     02/15 0701 - 02/16 0700   P.O. 1320   I.V. (mL/kg) 758.8 (7.3)   IV Piggyback 250   Total Intake(mL/kg) 2328.8 (22.4)   Urine (mL/kg/hr) 970 (0.7)   Chest Tube    Total Output 970   Net +1358.8         . amiodarone (NEXTERONE PREMIX) 360 mg/200 mL dextrose 0.5 mg/min (08/17/12 1730)     Lab Results  Component Value Date   WBC 11.2* 08/17/2012   HGB 11.7* 08/17/2012   HCT 35.1* 08/17/2012   PLT 134* 08/17/2012   GLUCOSE 163* 08/17/2012   CHOL 173 06/04/2012   TRIG 110 06/04/2012   HDL 32* 06/04/2012   LDLDIRECT 153.1 11/03/2010   LDLCALC 119* 06/04/2012   ALT 18 08/13/2012   AST 16 08/13/2012   NA 132* 08/17/2012   K 4.6 08/17/2012   CL 97 08/17/2012   CREATININE 1.27 08/17/2012   BUN 18 08/17/2012   CO2 27 08/17/2012   TSH 2.025 08/17/2012   PSA 0.24 03/10/2010   INR 1.32 08/15/2012   HGBA1C 8.2* 08/13/2012   MICROALBUR 5.0* 03/10/2010   Developed rapid  afib today, started on IV Amindarone Now back in sinus,   [name] MD  Beeper [phone] Office [phone] 08/17/2012 7:32 PM

## 2012-08-17 NOTE — Progress Notes (Signed)
HR 120-150, atrial fib.  MD Servando Snare made aware.  Orders received.  Will continue to monitor.

## 2012-08-18 ENCOUNTER — Inpatient Hospital Stay (HOSPITAL_COMMUNITY): Payer: BC Managed Care – PPO

## 2012-08-18 LAB — GLUCOSE, CAPILLARY
Glucose-Capillary: 137 mg/dL — ABNORMAL HIGH (ref 70–99)
Glucose-Capillary: 125 mg/dL — ABNORMAL HIGH (ref 70–99)
Glucose-Capillary: 91 mg/dL (ref 70–99)
Glucose-Capillary: 112 mg/dL — ABNORMAL HIGH (ref 70–99)
Glucose-Capillary: 115 mg/dL — ABNORMAL HIGH (ref 70–99)
Glucose-Capillary: 127 mg/dL — ABNORMAL HIGH (ref 70–99)
Glucose-Capillary: 112 mg/dL — ABNORMAL HIGH (ref 70–99)

## 2012-08-18 LAB — BASIC METABOLIC PANEL
BUN: 20 mg/dL (ref 6–23)
CO2: 29 mEq/L (ref 19–32)
Calcium: 8.7 mg/dL (ref 8.4–10.5)
Chloride: 95 mEq/L — ABNORMAL LOW (ref 96–112)
Creatinine, Ser: 1.01 mg/dL (ref 0.50–1.35)
GFR calc Af Amer: >90 mL/min (ref >90)
GFR calc non Af Amer: 84 mL/min — ABNORMAL LOW (ref >90)
Glucose, Bld: 98 mg/dL (ref 70–99)
Potassium: 4.6 mEq/L (ref 3.5–5.1)
Sodium: 128 mEq/L — ABNORMAL LOW (ref 135–145)

## 2012-08-18 LAB — CBC
HCT: 32.2 % — ABNORMAL LOW (ref 39.0–52.0)
Hemoglobin: 10.5 g/dL — ABNORMAL LOW (ref 13.0–17.0)
MCH: 31.8 pg (ref 26.0–34.0)
MCHC: 32.6 g/dL (ref 30.0–36.0)
MCV: 97.6 fL (ref 78.0–100.0)
Platelets: 149 10*3/uL — ABNORMAL LOW (ref 150–400)
RBC: 3.3 MIL/uL — ABNORMAL LOW (ref 4.22–5.81)
RDW: 14.7 % (ref 11.5–15.5)
WBC: 10.5 10*3/uL (ref 4.0–10.5)

## 2012-08-18 MED ORDER — FUROSEMIDE 40 MG PO TABS
40.0000 mg | ORAL_TABLET | Freq: Every day | ORAL | Status: DC
  Administered 2012-08-19 – 2012-08-22 (×4): 40 mg via ORAL
  Filled 2012-08-18 (×4): qty 1

## 2012-08-18 NOTE — Progress Notes (Signed)
Assessed patient ambulation status. Patient ambulated 2x prior to transferring to 2000 today. Will continue to monitor.

## 2012-08-18 NOTE — Plan of Care (Signed)
Problem: Phase III Progression Outcomes Goal: Time patient transferred to PCTU/Telemetry POD Outcome: Completed/Met Date Met:  08/18/12 1200

## 2012-08-18 NOTE — Progress Notes (Signed)
Patient ID: Vincent Chandler, male   DOB: 1961-01-07, 52 y.o.   MRN: [id number] TCTS DAILY PROGRESS NOTE                   [address]            [place] [address]          [phone]      3 Days Post-Op Procedure(s) (LRB): CORONARY ARTERY BYPASS GRAFTING (CABG) (N/A)  Total Length of Stay:  LOS: 3 days   Subjective: Afib yesterday , now holding sinus Feels better today, walked around unit  Objective: Vital signs in last 24 hours: Temp:  [97.5 F (36.4 C)-98.5 F (36.9 C)] 97.5 F (36.4 C) (02/16 0840) Pulse Rate:  [62-131] 131 (02/16 1000) Cardiac Rhythm:  [-] Normal sinus rhythm (02/16 1000) Resp:  [11-25] 20 (02/16 1000) BP: (86-164)/(60-93) 104/66 mmHg (02/16 0900) SpO2:  [76 %-100 %] 95 % (02/16 1000) Weight:  [228 lb 9.9 oz (103.7 kg)] 228 lb 9.9 oz (103.7 kg) (02/16 0500)  Filed Weights   08/16/12 0600 08/17/12 0500 08/18/12 0500  Weight: 227 lb 4.7 oz (103.1 kg) 228 lb 13.4 oz (103.8 kg) 228 lb 9.9 oz (103.7 kg)    Weight change: -3.5 oz (-0.1 kg)   Hemodynamic parameters for last 24 hours:    Intake/Output from previous day: 02/15 0701 - 02/16 0700 In: 3378.3 [P.O.:2040; I.V.:1088.3; IV Piggyback:250] Out: 2545 [Urine:2545]  Intake/Output this shift: Total I/O In: 60 [I.V.:60] Out: 600 [Urine:600]  Current Meds: Scheduled Meds: . amiodarone  200 mg Oral Q12H   Followed by  . [START ON 08/25/2012] amiodarone  200 mg Oral Daily  . budesonide-formoterol  2 puff Inhalation BID  . docusate sodium  200 mg Oral Daily  . enoxaparin (LOVENOX) injection  30 mg Subcutaneous Q24H  . [START ON 08/19/2012] furosemide  40 mg Oral Daily  . insulin aspart  0-24 Units Subcutaneous TID AC & HS  . insulin detemir  20 Units Subcutaneous BID  . lisinopril  2.5 mg Oral Daily  . metFORMIN  1,000 mg Oral Q breakfast  . metoprolol tartrate  12.5 mg Oral BID  . mupirocin ointment  1 application Nasal BID  . pantoprazole  40 mg Oral QAC breakfast  .  potassium chloride  20 mEq Oral Daily  . simvastatin  20 mg Oral q1800  . sodium chloride  3 mL Intravenous Q12H  . thiamine  100 mg Oral Daily   Continuous Infusions:   PRN Meds:.sodium chloride, bisacodyl, bisacodyl, guaiFENesin, levalbuterol, oxyCODONE, sodium chloride, traMADol  General appearance: alert, cooperative and no distress Neurologic: intact Heart: regular rate and rhythm, S1, S2 normal, no murmur, click, rub or gallop and normal apical impulse Lungs: clear to auscultation bilaterally and normal percussion bilaterally Abdomen: soft, non-tender; bowel sounds normal; no masses,  no organomegaly Extremities: extremities normal, atraumatic, no cyanosis or edema and Homans sign is negative, no sign of DVT Wound: sternum stable  Lab Results: CBC:  Recent Labs  08/17/12 0400 08/18/12 0430  WBC 11.2* 10.5  HGB 11.7* 10.5*  HCT 35.1* 32.2*  PLT 134* 149*   BMET:   Recent Labs  08/17/12 0400 08/18/12 0430  NA 132* 128*  K 4.6 4.6  CL 97 95*  CO2 27 29  GLUCOSE 163* 98  BUN 18 20  CREATININE 1.27 1.01  CALCIUM 8.4 8.7    PT/INR:   Recent Labs  08/15/12 1414  LABPROT 16.1*  INR 1.32   Radiology: Dg Chest 2 View  08/18/2012  *RADIOLOGY REPORT*  Clinical Data: Postop cardiac surgery  CHEST - 2 VIEW  Comparison: Yesterday  Findings: Right internal jugular introducer sheath is stable. Scattered atelectasis is stable.  No Kerley B lines to suggest edema.  No pneumothorax.  Very low lung volumes persist. Increasing vascular congestion and edema.  Small bilateral pleural effusions.  IMPRESSION: Increasing vascular congestion and edema.  Low volumes and bilateral subsegmental atelectasis.   Original Report Authenticated By: Marybelle Killings, M.D.    Dg Chest Port 1 View  08/17/2012  *RADIOLOGY REPORT*  Clinical Data: Postop CABG  PORTABLE CHEST - 1 VIEW  Comparison: Yesterday  Findings: Moderate cardiomegaly stable.  Swan-Ganz removed with the introducer left in place.   Chest tube removed without ensuing pneumothorax.  Lungs remain under aerated with scattered atelectasis. Diffuse edema has resolved.  IMPRESSION: Chest tube removed without pneumothorax.  Scattered atelectasis. Resolved edema.   Original Report Authenticated By: Marybelle Killings, M.D.      Assessment/Plan: S/P Procedure(s) (LRB): CORONARY ARTERY BYPASS GRAFTING (CABG) (N/A) Mobilize Diuresis- on lasix Diabetes control good control Episode of AFIB- on Cordarone now holding sinus Plan for transfer to step-down: see transfer orders Back on ACE for preop lv dysfunction    Love Chowning B 08/18/2012 10:45 AM

## 2012-08-18 NOTE — Progress Notes (Signed)
Pt transferred via wheelchair on 2L [place] to room 2015.  Patient placed on monitor, positioned comfortably in chair on 2L [place].  Left with RN and NT in room, alert, oriented, comfortable.  Tolerated well.

## 2012-08-19 ENCOUNTER — Encounter (HOSPITAL_COMMUNITY): Payer: Self-pay | Admitting: Cardiothoracic Surgery

## 2012-08-19 LAB — BASIC METABOLIC PANEL
BUN: 21 mg/dL (ref 6–23)
CO2: 29 mEq/L (ref 19–32)
Calcium: 9.2 mg/dL (ref 8.4–10.5)
Chloride: 94 mEq/L — ABNORMAL LOW (ref 96–112)
Creatinine, Ser: 0.94 mg/dL (ref 0.50–1.35)
GFR calc Af Amer: >90 mL/min (ref >90)
GFR calc non Af Amer: >90 mL/min (ref >90)
Glucose, Bld: 100 mg/dL — ABNORMAL HIGH (ref 70–99)
Potassium: 4.5 mEq/L (ref 3.5–5.1)
Sodium: 132 mEq/L — ABNORMAL LOW (ref 135–145)

## 2012-08-19 LAB — CBC
HCT: 31.8 % — ABNORMAL LOW (ref 39.0–52.0)
Hemoglobin: 10.5 g/dL — ABNORMAL LOW (ref 13.0–17.0)
MCH: 32.5 pg (ref 26.0–34.0)
MCHC: 33 g/dL (ref 30.0–36.0)
MCV: 98.5 fL (ref 78.0–100.0)
Platelets: 178 10*3/uL (ref 150–400)
RBC: 3.23 MIL/uL — ABNORMAL LOW (ref 4.22–5.81)
RDW: 14.8 % (ref 11.5–15.5)
WBC: 8 10*3/uL (ref 4.0–10.5)

## 2012-08-19 LAB — GLUCOSE, CAPILLARY
Glucose-Capillary: 101 mg/dL — ABNORMAL HIGH (ref 70–99)
Glucose-Capillary: 128 mg/dL — ABNORMAL HIGH (ref 70–99)
Glucose-Capillary: 100 mg/dL — ABNORMAL HIGH (ref 70–99)
Glucose-Capillary: 104 mg/dL — ABNORMAL HIGH (ref 70–99)

## 2012-08-19 MED ORDER — ASPIRIN EC 325 MG PO TBEC: 325.0000 mg | DELAYED_RELEASE_TABLET | Freq: Every day | ORAL | Status: DC

## 2012-08-19 MED ORDER — POLYETHYLENE GLYCOL 3350 17 G PO PACK
17.0000 g | PACK | Freq: Once | ORAL | Status: AC
  Administered 2012-08-19: 17 g via ORAL
  Filled 2012-08-19: qty 1

## 2012-08-19 MED ORDER — METOPROLOL TARTRATE 25 MG PO TABS: 25.0000 mg | ORAL_TABLET | Freq: Two times a day (BID) | ORAL | Status: DC

## 2012-08-19 MED ORDER — AMIODARONE HCL 200 MG PO TABS: 200.0000 mg | ORAL_TABLET | Freq: Two times a day (BID) | ORAL | Status: DC

## 2012-08-19 MED ORDER — METFORMIN HCL ER 500 MG PO TB24
1000.0000 mg | ORAL_TABLET | Freq: Every day | ORAL | Status: DC
  Administered 2012-08-20 – 2012-08-22 (×3): 1000 mg via ORAL
  Filled 2012-08-19 (×4): qty 2

## 2012-08-19 MED ORDER — OXYCODONE HCL 5 MG PO TABS: 5.0000 mg | ORAL_TABLET | ORAL | Status: DC | PRN

## 2012-08-19 MED ORDER — BUDESONIDE-FORMOTEROL FUMARATE 160-4.5 MCG/ACT IN AERO: 2.0000 | INHALATION_SPRAY | Freq: Two times a day (BID) | RESPIRATORY_TRACT | Status: DC

## 2012-08-19 MED ORDER — POTASSIUM CHLORIDE CRYS ER 20 MEQ PO TBCR: 20.0000 meq | EXTENDED_RELEASE_TABLET | Freq: Every day | ORAL | Status: DC

## 2012-08-19 MED ORDER — FUROSEMIDE 40 MG PO TABS: 40.0000 mg | ORAL_TABLET | Freq: Every day | ORAL | Status: DC

## 2012-08-19 MED ORDER — ASPIRIN EC 325 MG PO TBEC
325.0000 mg | DELAYED_RELEASE_TABLET | Freq: Every day | ORAL | Status: DC
  Administered 2012-08-19 – 2012-08-22 (×4): 325 mg via ORAL
  Filled 2012-08-19 (×4): qty 1

## 2012-08-19 MED ORDER — LISINOPRIL 2.5 MG PO TABS: ORAL_TABLET | ORAL | Status: DC

## 2012-08-19 MED ORDER — METFORMIN HCL 500 MG PO TABS
1000.0000 mg | ORAL_TABLET | Freq: Two times a day (BID) | ORAL | Status: DC
  Filled 2012-08-19 (×2): qty 2

## 2012-08-19 MED ORDER — METOPROLOL TARTRATE 25 MG PO TABS
25.0000 mg | ORAL_TABLET | Freq: Two times a day (BID) | ORAL | Status: DC
  Administered 2012-08-19 – 2012-08-22 (×7): 25 mg via ORAL
  Filled 2012-08-19 (×8): qty 1

## 2012-08-19 MED FILL — Heparin Sodium (Porcine) Inj 1000 Unit/ML: INTRAMUSCULAR | Qty: 30 | Status: AC

## 2012-08-19 MED FILL — Magnesium Sulfate Inj 50%: INTRAMUSCULAR | Qty: 10 | Status: AC

## 2012-08-19 MED FILL — Sodium Chloride IV Soln 0.9%: INTRAVENOUS | Qty: 1000 | Status: AC

## 2012-08-19 MED FILL — Sodium Chloride Irrigation Soln 0.9%: Qty: 3000 | Status: AC

## 2012-08-19 MED FILL — Heparin Sodium (Porcine) Inj 1000 Unit/ML: INTRAMUSCULAR | Qty: 10 | Status: AC

## 2012-08-19 MED FILL — Electrolyte-R (PH 7.4) Solution: INTRAVENOUS | Qty: 4000 | Status: AC

## 2012-08-19 MED FILL — Mannitol IV Soln 20%: INTRAVENOUS | Qty: 500 | Status: AC

## 2012-08-19 MED FILL — Potassium Chloride Inj 2 mEq/ML: INTRAVENOUS | Qty: 40 | Status: AC

## 2012-08-19 MED FILL — Lidocaine HCl IV Inj 20 MG/ML: INTRAVENOUS | Qty: 5 | Status: AC

## 2012-08-19 MED FILL — Sodium Bicarbonate IV Soln 8.4%: INTRAVENOUS | Qty: 50 | Status: AC

## 2012-08-19 NOTE — Progress Notes (Addendum)
CARDIAC REHAB PHASE I   PRE:  Rate/Rhythm: 91 SR    BP: sitting 145/73    SaO2: 94 1 1/2L, 84 RA, 91 2L  MODE:  Ambulation: 350 ft   POST:  Rate/Rhythm: 102 ST    BP: sitting 153/86     SaO2: 75 2L after 50 ft, 93-96 4L  Pt doing well except unable to wean O2. In fact needed increased O2 to walk. Sts he is having trouble inhaling through left nostril. Used RW with supervision. Return to chair. Some phlegm production after walk. Encouraged IS and pt stated he needed help remembering to do. Will f/u. [phone]  Darrick Meigs CES, ACSM

## 2012-08-19 NOTE — Progress Notes (Signed)
RT set patient patient up on CPAP. Auto titrate from 4cm H2O min to 12cm H2O per patient comfort. Patient has full face large mask with 2L of oxygen bled in. Sterile water added to fill line. RT encouraged patient to call if he needed any further assistance.

## 2012-08-19 NOTE — Progress Notes (Addendum)
[address]            [place] [address]          [phone]      4 Days Post-Op Procedure(s) (LRB): CORONARY ARTERY BYPASS GRAFTING (CABG) (N/A)  Subjective: Patient has not had a bowel movement yet. He wants to walk more.  Objective: Vital signs in last 24 hours: Temp:  [97.5 F (36.4 C)-99.2 F (37.3 C)] 98.5 F (36.9 C) (02/17 0505) Pulse Rate:  [82-131] 83 (02/17 0505) Cardiac Rhythm:  [-] Normal sinus rhythm (02/16 1945) Resp:  [11-22] 18 (02/17 0505) BP: (97-145)/(62-86) 145/86 mmHg (02/17 0505) SpO2:  [90 %-100 %] 93 % (02/17 0505) Weight:  [101.606 kg (224 lb)] 101.606 kg (224 lb) (02/17 0505)  Pre op weight  98 kg Current Weight  08/19/12 101.606 kg (224 lb)      Intake/Output from previous day: 02/16 0701 - 02/17 0700 In: 1020 [P.O.:960; I.V.:60] Out: 1925 [Urine:1925]   Physical Exam:  Cardiovascular: RRR, no murmurs, gallops, or rubs. Pulmonary: Diminished at bases; no rales, wheezes, or rhonchi. Abdomen: Soft, non tender, bowel sounds present. Extremities: Mild bilateral lower extremity edema. Wounds: Clean and dry.  No erythema or signs of infection.  Lab Results: CBC: Recent Labs  08/18/12 0430 08/19/12 0449  WBC 10.5 8.0  HGB 10.5* 10.5*  HCT 32.2* 31.8*  PLT 149* 178   BMET:  Recent Labs  08/18/12 0430 08/19/12 0449  NA 128* 132*  K 4.6 4.5  CL 95* 94*  CO2 29 29  GLUCOSE 98 100*  BUN 20 21  CREATININE 1.01 0.94  CALCIUM 8.7 9.2    PT/INR:  Lab Results  Component Value Date   INR 1.32 08/15/2012   INR 0.99 08/13/2012   INR 1.2* 08/05/2012   ABG:  INR: Will add last result for INR, ABG once components are confirmed Will add last 4 CBG results once components are confirmed  Assessment/Plan:  1. CV - Previous afib. Maintaining SR. On Amiodarone 200 bid, Lisinopril 2.5 daily, Lopressor 12.5 bid. Will increase Lopressor 25 bid. Monitor BP as may need to increase Lisinopril. 2.   Pulmonary - Encourage incentive spirometer and wean off oxygen (on 2 liters via [place]) 3. Volume Overload - Continue with daily Lasix 4.  Acute blood loss anemia - H and H stable at 10.5 and 31.8 5.DM-127/112/101. On Metformin XR 1000 daily, levemir 20 bid. Pre op HGA1C 8.2Will stop scheduled insulin. Will need follow up as an outpatient. 6.Hyponatremia-sodium up to 132. 7.Thrombocytopenia resolved as platelets up to 178,000 8.LOC constipation 9.Remove EPW in am 10.Start EC ASA 325 daily 11.Possible discharge 1-2 days  Arelene Moroni MPA-C 08/19/2012,7:49 AM

## 2012-08-19 NOTE — Discharge Summary (Signed)
Physician Discharge Summary  Patient ID: Vincent Chandler MRN: [id number] DOB/AGE: 51-Nov-1962 51 y.o.  Admit date: 08/15/2012 Discharge date: 08/20/2012  Admission Diagnoses: 1.History of CAD (s/p PCI with DES to RCA 05') 2.Ischemic cardiomyopathy  3.History of hypertension 4.History of hyperlipidemia 5.History of DM type 2 6.History of OSA 7.History of pneumonia 8.History of GERD  Discharge Diagnoses:  1.History of CAD (s/p PCI with DES to RCA 05') 2.Ischemic cardiomyopathy  3.History of hypertension 4.History of hyperlipidemia 5.History of DM type 2 6.History of OSA 7.History of pneumonia 8.History of GERD 9.Mild ABL anemia  Procedure (s):  1. Coronary artery bypass grafting x4 (left internal mammary artery to LAD, saphenous vein graft to first diagonal, saphenous vein graft to obtuse marginal, saphenous vein graft to distal right coronary artery).  2. Endoscopic harvest of right leg greater saphenous vein by Dr. Prescott Gum on 08/15/2012.   History of Presenting Illness: This is a 52 year old obese Caucasian male diabetic nonsmoker who in 2005 he had a drug eluding stent placed to the RCA.He has an ischemic CM with an EF down to 35% and an abnormal Myoview last year with large inferior scar and no ischemia. He was apparently lost to follow up. Recently, he developed symptoms of dyspnea on exertion and decreasing exercise tolerance.He presented to the cardiologist's office for follow up. A  2-D echo showed inter-wall hypokinesia with decreased LV function EF of 25%. Cardiac catheterization done by Dr. Martinique on 08/08/2012 demonstrated chronic occlusion of the right coronary with reconstitution of the distal right via collaterals, high-grade 90% stenosis of the LAD diagonal, 80% stenosis of the circumflex and LVEDP of 25 mmHg. He was felt to be a candidate for surgical revitalization due to severe three-vessel disease, diabetes, and LV dysfunction. Potential risks, complications, and  benefits were discussed with the patient and he agreed to proceed. Pre operative carotid duplex US showed no significant internal carotid artery stenosis bilaterally. HIS ABI's were 1.07 on the right and 1.2 on the left.He was admitted to [facility] on 08/15/2012 in order to undergo a CABG x 4.  Brief Hospital Course:  He was extubated the evening of surgery without difficulty. He remained afebrile and hemodynamically stable. He was weaned off Dopamine and Milrinone. His Gordy Councilman, a line, chest tubes, and foley were removed early in his post operative course. He was started on Lopressor. He was weaned off the insulin drip. He was restarted on Metformin XR and started on Levemir. He had good control of his diabetes.He was volume overloaded and diuresed accordingly. He went into a fib with RVR on 2/14. He was started on an Amiodarone gttp. He converted to sinus rhythm and was placed on oral Amiodarone. He was felt surgically stable for transfer from the ICU to [facility] for further convalescence on 08/18/2012. He has been tolerating a diet. He will be given a laxative for constipation. He is ambulating on 2 liters of oxygen via nasal cannula. His oxygen will be weaned over the next 24 hours. His epicardial pacing wires and chest tube sutures will be removed prior to his discharge. Provided he remains afebrile, hemodynamically stable, and pending morning round evaluation, he will be surgically stable for discharge on 08/20/2012.   Latest Vital Signs: Blood pressure 145/86, pulse 83, temperature 98.5 F (36.9 C), temperature source Oral, resp. rate 18, height _0  (1.753 m), weight 101.606 kg (224 lb), SpO2 95.00%.  Physical Exam: Cardiovascular: RRR, no murmurs, gallops, or rubs.  Pulmonary: Diminished at bases; no rales,  wheezes, or rhonchi.  Abdomen: Soft, non tender, bowel sounds present.  Extremities: Mild bilateral lower extremity edema.  Wounds: Clean and dry. No erythema or signs of  infection.   Discharge Condition:Stable  Recent laboratory studies:  Lab Results  Component Value Date   WBC 8.0 08/19/2012   HGB 10.5* 08/19/2012   HCT 31.8* 08/19/2012   MCV 98.5 08/19/2012   PLT 178 08/19/2012   Lab Results  Component Value Date   NA 132* 08/19/2012   K 4.5 08/19/2012   CL 94* 08/19/2012   CO2 29 08/19/2012   CREATININE 0.94 08/19/2012   GLUCOSE 100* 08/19/2012      Diagnostic Studies: Dg Chest 2 View  08/18/2012  *RADIOLOGY REPORT*  Clinical Data: Postop cardiac surgery  CHEST - 2 VIEW  Comparison: Yesterday  Findings: Right internal jugular introducer sheath is stable. Scattered atelectasis is stable.  No Kerley B lines to suggest edema.  No pneumothorax.  Very low lung volumes persist. Increasing vascular congestion and edema.  Small bilateral pleural effusions.  IMPRESSION: Increasing vascular congestion and edema.  Low volumes and bilateral subsegmental atelectasis.   Original Report Authenticated By: Marybelle Killings, M.D.    Discharge Medications:   Medication List    TAKE these medications       amiodarone 200 MG tablet  Commonly known as:  PACERONE  Take 1 tablet (200 mg total) by mouth 2 (two) times daily.     aspirin EC 325 MG tablet  Take 1 tablet (325 mg total) by mouth daily.     budesonide-formoterol 160-4.5 MCG/ACT inhaler  Commonly known as:  SYMBICORT  Inhale 2 puffs into the lungs 2 (two) times daily.     furosemide 40 MG tablet  Commonly known as:  LASIX  Take 1 tablet (40 mg total) by mouth daily. For 5 days then stop.     lisinopril 5 MG tablet  Commonly known as:  PRINIVIL,ZESTRIL  TAKE ONE TABLET BY MOUTH ONE TIME DAILY     metFORMIN 500 MG 24 hr tablet  Commonly known as:  GLUCOPHAGE-XR  Take 2 tablets (1,000 mg total) by mouth daily with breakfast.     metoprolol tartrate 25 MG tablet  Commonly known as:  LOPRESSOR  Take 1 tablet (25 mg total) by mouth 2 (two) times daily.     oxyCODONE 5 MG immediate release tablet  Commonly  known as:  Oxy IR/ROXICODONE  Take 1-2 tablets (5-10 mg total) by mouth every 4 (four) hours as needed for pain.     potassium chloride SA 20 MEQ tablet  Commonly known as:  K-DUR,KLOR-CON  Take 1 tablet (20 mEq total) by mouth daily. For 5 days then stop.     pravastatin 40 MG tablet  Commonly known as:  PRAVACHOL  Take 40 mg by mouth daily.       The patient has been discharged on:   1.Beta Blocker:  Yes [ x  ]                              No   [   ]                              If No, reason:  2.Ace Inhibitor/ARB: Yes [ x  ]  No  [    ]                                     If No, reason:  3.Statin:   Yes [  x ]                  No  [   ]                  If No, reason:  4.Ecasa:  Yes  [  x ]                  No   [   ]                  If No, reason:  Follow Up Appointments:     Follow-up Information   Call Peter Martinique, MD. (for follow up appointment for 2 weeks)    Contact information:   [address]  [place] [address] [phone]       Follow up with Garnet Koyanagi, DO. (Call for a follow up appointment regarding further diabetes management)    Contact information:   [address] [facility] [place] [address] [phone]       Follow up with VAN Wilber Oliphant, MD. (PA/LAT CXR to be taken (at [facility] which is in the same buillding as Dr. Lucianne Lei Trigt's office)  On 09/11/2012 at 1:30 pm;Appointment with Dr. Prescott Gum is on 09/11/2012 at 2:30 pm   Contact information:   [address] [place] [address] [phone]       Signed: Lars Pinks MPA-C 08/19/2012, 8:50 AM

## 2012-08-19 NOTE — Progress Notes (Signed)
Pt ambulated 300 ft in hallway with RN, rolling walker, and 4L O2 by [place]. Pt tolerated ambulation well with no distress.

## 2012-08-19 NOTE — Care Management Note (Signed)
    Page 1 of 1   08/22/2012     5:18:49 PM   CARE MANAGEMENT NOTE 08/22/2012  Patient:  Vincent Chandler, Vincent Chandler   Account Number:  [id number]  Date Initiated:  08/19/2012  Documentation initiated by:  Kashari Chalmers  Subjective/Objective Assessment:   PT S/P CABG X 4 ON 08/15/12.  PTA, PT INDEPENDENT, LIVES ALONE.     Action/Plan:   [facility] TO PROVIDE 24HR CARE AT DISCHARGE.  WILL FOLLOW FOR HOME NEEDS AS PT PROGRESSES.   Anticipated DC Date:  08/21/2012   Anticipated DC Plan:  [place]  CM consult      Choice offered to / List presented to:     DME arranged  [place]      DME agency  [facility].        Status of service:  Completed, signed off Medicare Important Message given?   (If response is "NO", the following Medicare IM given date fields will be blank) Date Medicare IM given:   Date Additional Medicare IM given:    Discharge Disposition:  HOME/SELF CARE  Per UR Regulation:  Reviewed for med. necessity/level of care/duration of stay  If discussed at [place] of Stay Meetings, dates discussed:   08/22/2012    Comments:  08/22/12 Juaquina Machnik,RN,BSN [phone] PT [place].  WILL NEED HOME OXYGEN, AS DESATURATING WITH AMBULATION.  REQUESTS RW FOR HOME. PORTABLE TANK AND RW DELIVERED TO ROOM PRIOR TO DC.  PT TO DC HOME WITH SISTER.

## 2012-08-19 NOTE — Progress Notes (Signed)
08/19/2012 6:48 PM Nursing note pt. Ambulated 365f with RN, RW and on 4L o2 [name] as with prior walk this afternoon. Pt. Tolerated well. Upon return to room, pt. Placed on 2L 02 [name]. Encouraged one more walk this evening.  Vincent Chandler, Vincent Chandler

## 2012-08-20 ENCOUNTER — Inpatient Hospital Stay (HOSPITAL_COMMUNITY): Payer: BC Managed Care – PPO

## 2012-08-20 LAB — GLUCOSE, CAPILLARY
Glucose-Capillary: 113 mg/dL — ABNORMAL HIGH (ref 70–99)
Glucose-Capillary: 118 mg/dL — ABNORMAL HIGH (ref 70–99)
Glucose-Capillary: 147 mg/dL — ABNORMAL HIGH (ref 70–99)

## 2012-08-20 MED ORDER — LISINOPRIL 5 MG PO TABS
5.0000 mg | ORAL_TABLET | Freq: Every day | ORAL | Status: DC
  Administered 2012-08-20 – 2012-08-22 (×3): 5 mg via ORAL
  Filled 2012-08-20 (×3): qty 1

## 2012-08-20 MED ORDER — LISINOPRIL 5 MG PO TABS: ORAL_TABLET | ORAL | Status: DC

## 2012-08-20 MED ORDER — LEVALBUTEROL HCL 0.63 MG/3ML IN NEBU
0.6300 mg | INHALATION_SOLUTION | Freq: Three times a day (TID) | RESPIRATORY_TRACT | Status: DC
  Administered 2012-08-21 – 2012-08-22 (×4): 0.63 mg via RESPIRATORY_TRACT
  Filled 2012-08-20 (×9): qty 3

## 2012-08-20 NOTE — Progress Notes (Signed)
[address]       [place] [address]             [phone]    5 Days Post-Op  Procedure(s) (LRB): CORONARY ARTERY BYPASS GRAFTING (CABG) (N/A) Subjective: Feels ok, still on O2  Objective  Telemetry sinus rhythm  Temp:  [97.5 F (36.4 C)-98.3 F (36.8 C)] 97.5 F (36.4 C) (02/18 0500) Pulse Rate:  [83-91] 83 (02/18 0500) Resp:  [16-18] 18 (02/18 0500) BP: (132-149)/(71-82) 132/81 mmHg (02/18 0500) SpO2:  [95 %-96 %] 95 % (02/18 0500) Weight:  [224 lb 13.9 oz (102 kg)] 224 lb 13.9 oz (102 kg) (02/18 0500)   Intake/Output Summary (Last 24 hours) at 08/20/12 0726 Last data filed at 08/20/12 0300  Gross per 24 hour  Intake    720 ml  Output   1850 ml  Net  -1130 ml       General appearance: alert, cooperative and no distress Heart: regular rate and rhythm Lungs: + exp wheeze, dim in bases Abdomen: soft, nontender, + BS Extremities: mild edema Wound: incisions healing well  Lab Results:  Recent Labs  08/18/12 0430 08/19/12 0449  NA 128* 132*  K 4.6 4.5  CL 95* 94*  CO2 29 29  GLUCOSE 98 100*  BUN 20 21  CREATININE 1.01 0.94  CALCIUM 8.7 9.2   No results found for this basename: AST, ALT, ALKPHOS, BILITOT, PROT, ALBUMIN,  in the last 72 hours No results found for this basename: LIPASE, AMYLASE,  in the last 72 hours  Recent Labs  08/18/12 0430 08/19/12 0449  WBC 10.5 8.0  HGB 10.5* 10.5*  HCT 32.2* 31.8*  MCV 97.6 98.5  PLT 149* 178   No results found for this basename: CKTOTAL, CKMB, TROPONINI,  in the last 72 hours No components found with this basename: POCBNP,  No results found for this basename: DDIMER,  in the last 72 hours No results found for this basename: HGBA1C,  in the last 72 hours No results found for this basename: CHOL, HDL, LDLCALC, TRIG, CHOLHDL,  in the last 72 hours  Recent Labs  08/17/12 1130  TSH 2.025   No results found for this basename: VITAMINB12, FOLATE, FERRITIN, TIBC, IRON, RETICCTPCT,  in  the last 72 hours  Medications: Scheduled . amiodarone  200 mg Oral Q12H   Followed by  . [START ON 08/25/2012] amiodarone  200 mg Oral Daily  . aspirin EC  325 mg Oral Daily  . budesonide-formoterol  2 puff Inhalation BID  . docusate sodium  200 mg Oral Daily  . enoxaparin (LOVENOX) injection  30 mg Subcutaneous Q24H  . furosemide  40 mg Oral Daily  . insulin aspart  0-24 Units Subcutaneous TID AC & HS  . lisinopril  2.5 mg Oral Daily  . metFORMIN  1,000 mg Oral Q breakfast  . metoprolol tartrate  25 mg Oral BID  . mupirocin ointment  1 application Nasal BID  . pantoprazole  40 mg Oral QAC breakfast  . simvastatin  20 mg Oral q1800  . sodium chloride  3 mL Intravenous Q12H     Radiology/Studies:  No results found.  INR: Will add last result for INR, ABG once components are confirmed Will add last 4 CBG results once components are confirmed  Assessment/Plan: S/P Procedure(s) (LRB): CORONARY ARTERY BYPASS GRAFTING (CABG) (N/A)  1. Doing well overall, needs further pulm improvement, add xopenex short term,cont diuresis and recheck CXR  2 BP can tol  increase in lisinopril 3 sugars ok control 4 Rhythm stable 5 push rehab    LOS: 5 days    GOLD,WAYNE E 2/18/20147:26 AM

## 2012-08-20 NOTE — Progress Notes (Signed)
CARDIAC REHAB PHASE I   PRE:  Rate/Rhythm: 91SR  BP:  Supine:   Sitting: 107/86  Standing:    SaO2: 87%RA, 93%2L  MODE:  Ambulation: 550 ft   POST:  Rate/Rhythem: 105ST  BP:  Supine: 171/96, 167/81  Sitting:   Standing:    SaO2: 88%2L hall, 94%4L 1020-1045 Pt walked 550 ft with rolling walker and asst x 1. Started out on 2L but had to increase to 4L to keep sats up. Encouraged IS every hour. Pt has not been doing it this often. To bed after walk for pacing wire removal.BP elevated.  Jeani Sow

## 2012-08-20 NOTE — Progress Notes (Signed)
Pt ambulated in hallway with walkerx1 assist on 02 at 4L, pt tolerated well, ambulated 550 feet. Will continue to monitor patient. Lelend Heinecke, Bettina Gavia rN

## 2012-08-20 NOTE — Progress Notes (Signed)
Patient Vincent Chandler pulled per protocol and as ordered bp 168/86, all ends intact. Pt tolerated well. Pt reminded to lie supine approximately one hour. Will continue to closely monitor patient. Pt in SR hear rate 94

## 2012-08-21 LAB — GLUCOSE, CAPILLARY
Glucose-Capillary: 137 mg/dL — ABNORMAL HIGH (ref 70–99)
Glucose-Capillary: 131 mg/dL — ABNORMAL HIGH (ref 70–99)
Glucose-Capillary: 122 mg/dL — ABNORMAL HIGH (ref 70–99)
Glucose-Capillary: 116 mg/dL — ABNORMAL HIGH (ref 70–99)
Glucose-Capillary: 128 mg/dL — ABNORMAL HIGH (ref 70–99)

## 2012-08-21 MED ORDER — LACTULOSE 10 GM/15ML PO SOLN
20.0000 g | Freq: Every day | ORAL | Status: DC | PRN
  Filled 2012-08-21: qty 30

## 2012-08-21 NOTE — Progress Notes (Signed)
CARDIAC REHAB PHASE I   PRE:  Rate/Rhythm: 91 SR    BP: sitting 137/72    SaO2: 87-89 1L,   MODE:  Ambulation: 750 ft   POST:  Rate/Rhythm: 103 ST    BP: sitting 146/76     SaO2: 91-96 3L  SATURATION QUALIFICATIONS: (This note is used to comply with regulatory documentation for home oxygen)  Patient Saturations on Room Air at Rest = 84-88%  Patient Saturations on Room Air while Ambulating = 84%  Patient Saturations on 3 Liters of oxygen while Ambulating = 91-96%  Please briefly explain why patient needs home oxygen: Attempted to wean O2. SaO2 on pulse ox fluctuates 6 % on any amount of O2. Pt declines SOB, just struggles to pass air through left nostril. Sts it feels restricted. SaO2 decreased on RA and on 2L (84-90) walking. On 3L SaO2 remained above 90. O/w pt doing well. Would like RW for home. Left pt on 2L in room at rest due to insufficient SaO2 on 2L. Will f/u. [phone]  Darrick Meigs CES, ACSM

## 2012-08-21 NOTE — Progress Notes (Signed)
Pt ambulated 630f x 1 assist with RW, steady gait. Ambulated with 3L O2 [place], sats 88-92%. Upon return to room 02 decreased to 2L  O2 sat 91%. Will continue to monitor.

## 2012-08-21 NOTE — Progress Notes (Addendum)
[address]       ,[place] [address]             [phone]    6 Days Post-Op  Procedure(s) (LRB): CORONARY ARTERY BYPASS GRAFTING (CABG) (N/A) Subjective: Feels better  Objective  Telemetry sinus, pvc's  Temp:  [97.4 F (36.3 C)-99.2 F (37.3 C)] 97.5 F (36.4 C) (02/19 0502) Pulse Rate:  [85-99] 89 (02/19 0502) Resp:  [17-20] 18 (02/19 0502) BP: (121-168)/(63-97) 137/79 mmHg (02/19 0502) SpO2:  [95 %-98 %] 95 % (02/19 0502) Weight:  [215 lb 14.4 oz (97.932 kg)] 215 lb 14.4 oz (97.932 kg) (02/19 0502)   Intake/Output Summary (Last 24 hours) at 08/21/12 0853 Last data filed at 08/21/12 0231  Gross per 24 hour  Intake    720 ml  Output    650 ml  Net     70 ml       General appearance: alert, cooperative and no distress Heart: regular rate and rhythm Lungs: clear to auscultation bilaterally and no wheeze today Abdomen: benign Extremities: mild edema Wound: incisions healing well  Lab Results:  Recent Labs  08/19/12 0449  NA 132*  K 4.5  CL 94*  CO2 29  GLUCOSE 100*  BUN 21  CREATININE 0.94  CALCIUM 9.2   No results found for this basename: AST, ALT, ALKPHOS, BILITOT, PROT, ALBUMIN,  in the last 72 hours No results found for this basename: LIPASE, AMYLASE,  in the last 72 hours  Recent Labs  08/19/12 0449  WBC 8.0  HGB 10.5*  HCT 31.8*  MCV 98.5  PLT 178   No results found for this basename: CKTOTAL, CKMB, TROPONINI,  in the last 72 hours No components found with this basename: POCBNP,  No results found for this basename: DDIMER,  in the last 72 hours No results found for this basename: HGBA1C,  in the last 72 hours No results found for this basename: CHOL, HDL, LDLCALC, TRIG, CHOLHDL,  in the last 72 hours No results found for this basename: TSH, T4TOTAL, FREET3, T3FREE, THYROIDAB,  in the last 72 hours No results found for this basename: VITAMINB12, FOLATE, FERRITIN, TIBC, IRON, RETICCTPCT,  in the last 72  hours  Medications: Scheduled . amiodarone  200 mg Oral Q12H   Followed by  . [START ON 08/25/2012] amiodarone  200 mg Oral Daily  . aspirin EC  325 mg Oral Daily  . budesonide-formoterol  2 puff Inhalation BID  . docusate sodium  200 mg Oral Daily  . enoxaparin (LOVENOX) injection  30 mg Subcutaneous Q24H  . furosemide  40 mg Oral Daily  . insulin aspart  0-24 Units Subcutaneous TID AC & HS  . levalbuterol  0.63 mg Nebulization TID  . lisinopril  5 mg Oral Daily  . metFORMIN  1,000 mg Oral Q breakfast  . metoprolol tartrate  25 mg Oral BID  . pantoprazole  40 mg Oral QAC breakfast  . simvastatin  20 mg Oral q1800  . sodium chloride  3 mL Intravenous Q12H     Radiology/Studies:  Dg Chest 2 View  08/20/2012  *RADIOLOGY REPORT*  Clinical Data: 52 year old male status post open heart surgery. Pulmonary edema.  CHEST - 2 VIEW  Comparison: 08/18/2012 and earlier.  Findings: Right IJ introducer sheath has been removed. Stable cardiomegaly and mediastinal contours.  Stable sequelae of CABG. No pneumothorax.  Pulmonary vascularity has mildly decreased in there is no overt edema.  Small bilateral pleural effusions. Perihilar crowding  lung markings.  No pneumothorax. Stable visualized osseous structures.  IMPRESSION: Small bilateral pleural effusions and stable to mildly decreased vascular congestion without overt edema.   Original Report Authenticated By: Roselyn Reef, M.D.     INR: Will add last result for INR, ABG once components are confirmed Will add last 4 CBG results once components are confirmed  Assessment/Plan: S/P Procedure(s) (LRB): CORONARY ARTERY BYPASS GRAFTING (CABG) (N/A)  1 looks and feels well, not wheezing today, should be ready for D/C when off O2. Nursing reports he needs 4 liters to ambulate D/T desaturation. Push pulm toilet/cont RX    LOS: 6 days    GOLD,WAYNE E 2/19/20148:53 AM  patient examined and medical record reviewed,agree with above note. CXR clear-  wean off O2  If possible  -  DC to home tomorrow  With home O2 if needed VAN TRIGT III,PETER 08/21/2012

## 2012-08-22 LAB — GLUCOSE, CAPILLARY
Glucose-Capillary: 143 mg/dL — ABNORMAL HIGH (ref 70–99)
Glucose-Capillary: 105 mg/dL — ABNORMAL HIGH (ref 70–99)

## 2012-08-22 NOTE — Progress Notes (Signed)
Pt and sister educated and informed of DC information. No further questions or concerns. Pt verbalizes understanding of medications to start and continue and f/u appointments.

## 2012-08-22 NOTE — Discharge Summary (Signed)
   [address]       [place] [address]             [phone]    Addendum:  The patient has continued to progress nicely. He does require home oxygen which will be arranged prior to discharge. He does desaturate into the eighties without it. Otherwise he is quite stable. For full details of this hospitalization please see the previously dictated summary.

## 2012-08-22 NOTE — Progress Notes (Signed)
Ed completed with pt and sister. Good reception. Has not had formal DM ed and encouraged pt to discuss classes with PCP. Pt interested in CRPII and will send referral to [facility]. Pt sts he plans to quit tobacco. [place], ACSM

## 2012-08-22 NOTE — Progress Notes (Signed)
[address]       [place] [address]             [phone]    7 Days Post-Op  Procedure(s) (LRB): CORONARY ARTERY BYPASS GRAFTING (CABG) (N/A) Subjective: Doing well on O2  Objective  Telemetry sinus  Temp:  [98.4 F (36.9 C)-99.1 F (37.3 C)] 98.4 F (36.9 C) (02/20 0430) Pulse Rate:  [80-92] 80 (02/20 0430) Resp:  [18] 18 (02/20 0430) BP: (117-146)/(64-76) 117/68 mmHg (02/20 0430) SpO2:  [98 %-100 %] 100 % (02/20 0430) Weight:  [211 lb 8 oz (95.936 kg)] 211 lb 8 oz (95.936 kg) (02/20 0430)   Intake/Output Summary (Last 24 hours) at 08/22/12 0728 Last data filed at 08/22/12 0400  Gross per 24 hour  Intake    723 ml  Output   1175 ml  Net   -452 ml       General appearance: alert, cooperative and no distress Heart: regular rate and rhythm Lungs: clear to auscultation bilaterally Abdomen: benign Extremities: min edeema Wound: incis healing well  Lab Results: No results found for this basename: NA, K, CL, CO2, GLUCOSE, BUN, CREATININE, CALCIUM, MG, PHOS,  in the last 72 hours No results found for this basename: AST, ALT, ALKPHOS, BILITOT, PROT, ALBUMIN,  in the last 72 hours No results found for this basename: LIPASE, AMYLASE,  in the last 72 hours No results found for this basename: WBC, NEUTROABS, HGB, HCT, MCV, PLT,  in the last 72 hours No results found for this basename: CKTOTAL, CKMB, TROPONINI,  in the last 72 hours No components found with this basename: POCBNP,  No results found for this basename: DDIMER,  in the last 72 hours No results found for this basename: HGBA1C,  in the last 72 hours No results found for this basename: CHOL, HDL, LDLCALC, TRIG, CHOLHDL,  in the last 72 hours No results found for this basename: TSH, T4TOTAL, FREET3, T3FREE, THYROIDAB,  in the last 72 hours No results found for this basename: VITAMINB12, FOLATE, FERRITIN, TIBC, IRON, RETICCTPCT,  in the last 72 hours  Medications: Scheduled . amiodarone  200  mg Oral Q12H   Followed by  . [START ON 08/25/2012] amiodarone  200 mg Oral Daily  . aspirin EC  325 mg Oral Daily  . budesonide-formoterol  2 puff Inhalation BID  . docusate sodium  200 mg Oral Daily  . enoxaparin (LOVENOX) injection  30 mg Subcutaneous Q24H  . furosemide  40 mg Oral Daily  . insulin aspart  0-24 Units Subcutaneous TID AC & HS  . levalbuterol  0.63 mg Nebulization TID  . lisinopril  5 mg Oral Daily  . metFORMIN  1,000 mg Oral Q breakfast  . metoprolol tartrate  25 mg Oral BID  . pantoprazole  40 mg Oral QAC breakfast  . simvastatin  20 mg Oral q1800  . sodium chloride  3 mL Intravenous Q12H     Radiology/Studies:  Dg Chest 2 View  08/20/2012  *RADIOLOGY REPORT*  Clinical Data: 52 year old male status post open heart surgery. Pulmonary edema.  CHEST - 2 VIEW  Comparison: 08/18/2012 and earlier.  Findings: Right IJ introducer sheath has been removed. Stable cardiomegaly and mediastinal contours.  Stable sequelae of CABG. No pneumothorax.  Pulmonary vascularity has mildly decreased in there is no overt edema.  Small bilateral pleural effusions. Perihilar crowding lung markings.  No pneumothorax. Stable visualized osseous structures.  IMPRESSION: Small bilateral pleural effusions and stable to mildly decreased vascular  congestion without overt edema.   Original Report Authenticated By: Roselyn Reef, M.D.     INR: Will add last result for INR, ABG once components are confirmed Will add last 4 CBG results once components are confirmed  Assessment/Plan: S/P Procedure(s) (LRB): CORONARY ARTERY BYPASS GRAFTING (CABG) (N/A)  1 doing well - stable for d/c on O2    LOS: 7 days    Vincent Chandler,Vincent Chandler 2/20/20147:28 AM

## 2012-08-25 NOTE — Discharge Summary (Signed)
patient examined and medical record reviewed,agree with above note. VAN TRIGT III,Cope Marte 08/25/2012

## 2012-08-25 NOTE — Discharge Summary (Signed)
patient examined and medical record reviewed,agree with above note. VAN TRIGT III,PETER 08/25/2012   

## 2012-08-29 ENCOUNTER — Other Ambulatory Visit: Payer: Self-pay | Admitting: *Deleted

## 2012-08-29 DIAGNOSIS — G8918 Other acute postprocedural pain: Secondary | ICD-10-CM

## 2012-08-29 MED ORDER — HYDROCODONE-ACETAMINOPHEN 7.5-325 MG PO TABS: 1.0000 | ORAL_TABLET | ORAL | Status: DC | PRN

## 2012-08-31 ENCOUNTER — Telehealth: Payer: Self-pay | Admitting: Adult Health

## 2012-08-31 ENCOUNTER — Other Ambulatory Visit: Payer: Self-pay | Admitting: Adult Health

## 2012-08-31 NOTE — Telephone Encounter (Signed)
Received phone call from Vincent Chandler concerning a gum infection that Vincent Chandler is having, with worsening symptoms. They are not able to contact his dentist. Concerns about the infection in relation to recent CABG by Dr. Nils Pyle  Coronary artery bypass grafting x4 (left internal mammary artery to LAD, saphenous vein graft to first diagonal, saphenous vein graft to obtuse marginal, saphenous vein graft to distal right coronary artery). On August 16, 2012.  With fever and worsening symptoms, they are requesting abx treatment.   I have called in a Rx for clindimycin 450 mg Q 6 hours for 7 days. He is advised to seek attention of a dentist ASAP, even if his usual dentist in not available. He has no allergies on review of his chart. This was called in to [facility] [phone].

## 2012-09-04 ENCOUNTER — Encounter: Payer: Self-pay | Admitting: Nurse Practitioner

## 2012-09-04 ENCOUNTER — Ambulatory Visit (INDEPENDENT_AMBULATORY_CARE_PROVIDER_SITE_OTHER): Payer: BC Managed Care – PPO | Admitting: Nurse Practitioner

## 2012-09-04 ENCOUNTER — Telehealth: Payer: Self-pay | Admitting: Cardiology

## 2012-09-04 VITALS — BP 112/75 | HR 80 | Ht 69.0 in | Wt 211.0 lb

## 2012-09-04 DIAGNOSIS — Z951 Presence of aortocoronary bypass graft: Secondary | ICD-10-CM

## 2012-09-04 DIAGNOSIS — I2510 Atherosclerotic heart disease of native coronary artery without angina pectoris: Secondary | ICD-10-CM

## 2012-09-04 LAB — BASIC METABOLIC PANEL
BUN: 21 mg/dL (ref 6–23)
CO2: 29 mEq/L (ref 19–32)
Calcium: 9.5 mg/dL (ref 8.4–10.5)
Chloride: 103 mEq/L (ref 96–112)
Creatinine, Ser: 1.2 mg/dL (ref 0.4–1.5)
GFR: 66.53 mL/min (ref >60.00)
Glucose, Bld: 124 mg/dL — ABNORMAL HIGH (ref 70–99)
Potassium: 4.9 mEq/L (ref 3.5–5.1)
Sodium: 139 mEq/L (ref 135–145)

## 2012-09-04 LAB — CBC WITH DIFFERENTIAL/PLATELET
Basophils Absolute: 0 10*3/uL (ref 0.0–0.1)
Basophils Relative: 0.4 % (ref 0.0–3.0)
Eosinophils Absolute: 0.2 10*3/uL (ref 0.0–0.7)
Eosinophils Relative: 3.2 % (ref 0.0–5.0)
HCT: 37.1 % — ABNORMAL LOW (ref 39.0–52.0)
Hemoglobin: 12.2 g/dL — ABNORMAL LOW (ref 13.0–17.0)
Lymphocytes Relative: 17.6 % (ref 12.0–46.0)
Lymphs Abs: 1.2 10*3/uL (ref 0.7–4.0)
MCHC: 33 g/dL (ref 30.0–36.0)
MCV: 96.8 fl (ref 78.0–100.0)
Monocytes Absolute: 0.9 10*3/uL (ref 0.1–1.0)
Monocytes Relative: 12.2 % — ABNORMAL HIGH (ref 3.0–12.0)
Neutro Abs: 4.7 10*3/uL (ref 1.4–7.7)
Neutrophils Relative %: 66.6 % (ref 43.0–77.0)
Platelets: 447 10*3/uL — ABNORMAL HIGH (ref 150.0–400.0)
RBC: 3.83 Mil/uL — ABNORMAL LOW (ref 4.22–5.81)
RDW: 15.8 % — ABNORMAL HIGH (ref 11.5–14.6)
WBC: 7 10*3/uL (ref 4.5–10.5)

## 2012-09-04 NOTE — Patient Instructions (Addendum)
I have sent a note to cardiac rehab - ok to start  Stay on your current medicines  I want to see you in 2 weeks - We will then try to start titrating your medicines back up   Try to get some scales and weigh each day  Ok to stop the oxygen  Call the [facility] office at [phone] if you have any questions, problems or concerns.

## 2012-09-04 NOTE — Telephone Encounter (Signed)
Ok to discontinue oxygen. O2 sat today was 97%.

## 2012-09-04 NOTE — Progress Notes (Addendum)
Vincent Chandler Date of Birth: 16-Feb-1961 Medical Record #[id number]  History of Present Illness: Vincent Chandler is seen back today for a post hospital visit. He is seen for Dr. Caryl Comes. He has an ischemic CM. EF was down to 35% last year and had had an abnormal Myoview last year as well. Then lost to follow up and got off track with taking care of himself.   We were most recently getting him back on his medicines and he was obtaining ObamaCare. His repeat echo showed an EF of 30%. We proceeded with cardiac cath and had to have CABG x 4 in light of his severe 3 vessel disease, DM and poor LV dysfunction. He had LIMA to LAD, SVG to 1st DX, SVG to OM and SVG to distal RCA. His EF at the time of his cath was 25%.   He comes in today. He is here with his sister. He is doing ok. Has had issues with a gum infection and we gave him some Clindamycin until he could get to the dentist. He has been home about 2 weeks. Doing ok. No real problems. Not short of breath. Not dizzy. Ok on his medicines but on lower doses now of his ACE and beta blocker. He is on amiodarone for some post op atrial fib with RVR. Chest is a little sore. Anxious to start rehab. Has already resumed driving. Has been to the dentist and had his tooth pulled. Weight is down 11 pounds. He does not have any swelling and overall seems to be ok. Taking only one pain pill at night to help him rest.    Current Outpatient Prescriptions on File Prior to Visit  Medication Sig Dispense Refill  . amiodarone (PACERONE) 200 MG tablet Take 1 tablet (200 mg total) by mouth 2 (two) times daily.  60 tablet  1  . aspirin EC 325 MG tablet Take 1 tablet (325 mg total) by mouth daily.  30 tablet  0  . budesonide-formoterol (SYMBICORT) 160-4.5 MCG/ACT inhaler Inhale 2 puffs into the lungs 2 (two) times daily.  1 Inhaler  0  . lisinopril (PRINIVIL,ZESTRIL) 5 MG tablet TAKE ONE TABLET BY MOUTH ONE TIME DAILY  30 tablet  1  . metFORMIN (GLUCOPHAGE-XR) 500 MG 24 hr tablet  Take 2 tablets (1,000 mg total) by mouth daily with breakfast.  60 tablet  2  . metoprolol (LOPRESSOR) 25 MG tablet Take 1 tablet (25 mg total) by mouth 2 (two) times daily.  60 tablet  6  . pravastatin (PRAVACHOL) 40 MG tablet Take 40 mg by mouth daily.       No current facility-administered medications on file prior to visit.    No Known Allergies  Past Medical History  Diagnosis Date  . Coronary artery disease     drug eluting stent RCA 2005-EF 30% at that time - s/p CABG x 4  . Cardiomyopathy     alcohol use related  . Asthma   . Hypertension   . Urinary incontinence   . Hyperlipidemia   . History of hypogonadism   . Pneumonia 02/16/2012    "today makes third time in my life"  . OSA on CPAP 02/16/2012    "wear mask sometimes"  . H/O hiatal hernia   . GERD (gastroesophageal reflux disease)   . Migraines 02/16/2012    "used to"  . Anxiety   . Diabetes mellitus type II     Type 2 NIDDM x 7 years  . History of kidney  stones 06/2012    Past Surgical History  Procedure Laterality Date  . Tonsillectomy  ~ 1970  . Coronary angioplasty with stent placement  ~ 2003  . Refractive surgery  1990's    bilaterally  . Coronary artery bypass graft N/A 08/15/2012    Procedure: CORONARY ARTERY BYPASS GRAFTING (CABG);  Surgeon: Ivin Poot, MD;  Location: [facility];  Service: Open Heart Surgery;  Laterality: N/A;  Coronary Artery Bypass Grafting Times Four Using Left Internal Mammary Artery and Right Saphenous Leg Vein Harvested Endoscopically    History  Smoking status  . Never Smoker   Smokeless tobacco  . Current User  . Types: Chew    Comment: 02/16/2012 smoking cessation materials offered; pt declines    History  Alcohol Use  . Yes    Comment: socially occasional    Family History  Problem Relation Age of Onset  . Hypertension Mother   . Diabetes Mother   . Hypertension Father   . Diabetes Father   . Hyperlipidemia Father     Review of Systems: The review of  systems is per the HPI.  All other systems were reviewed and are negative.  Physical Exam: BP 112/75  Pulse 80  Ht _0  (1.753 m)  Wt 211 lb (95.709 kg)  BMI 31.15 kg/m2 Patient is very pleasant and in no acute distress. Skin is warm and dry. Color is normal. He does look a little pale.  HEENT is unremarkable. Normocephalic/atraumatic. PERRL. Sclera are nonicteric. Neck is supple. No masses. No JVD. Lungs are clear. Cardiac exam shows a regular rate and rhythm. Sternum looks good. Abdomen is soft. Extremities are without edema. Vein harvesting on the right looks good. Gait and ROM are intact. No gross neurologic deficits noted.  LABORATORY DATA: BMET and CBC are pending for today. EKG today shows sinus rhythm. He has inferior Q's.  Lab Results  Component Value Date   WBC 8.0 08/19/2012   HGB 10.5* 08/19/2012   HCT 31.8* 08/19/2012   PLT 178 08/19/2012   GLUCOSE 100* 08/19/2012   CHOL 173 06/04/2012   TRIG 110 06/04/2012   HDL 32* 06/04/2012   LDLDIRECT 153.1 11/03/2010   LDLCALC 119* 06/04/2012   ALT 18 08/13/2012   AST 16 08/13/2012   NA 132* 08/19/2012   K 4.5 08/19/2012   CL 94* 08/19/2012   CREATININE 0.94 08/19/2012   BUN 21 08/19/2012   CO2 29 08/19/2012   TSH 2.025 08/17/2012   PSA 0.24 03/10/2010   INR 1.32 08/15/2012   HGBA1C 8.2* 08/13/2012   MICROALBUR 5.0* 03/10/2010    Assessment / Plan: 1. Severe 3 vessel CAD - s/p CABG x 4 - doing ok. He may start cardiac rehab. We have reviewed his lifting/exercise limits.   2. Severe LV dysfunction - EF of 25% - will need to keep on his CHF regimen but plan on up titrating his medicines. Recheck his echo in 3 months and then proceed with ICD if EF remains less than 40%.   3. HTN - BP looks ok.   4. Post op atrial fib - on amiodarone - holding in sinus. I will get Dr. Olin Pia opinion about staying on low dose amiodarone in light of the low EF. He has had no ventricular arrhythmias.   We are checking labs today. I will see him in 2 weeks. Hope  to uptitrate his medicines on return.   Patient is agreeable to this plan and will call if any problems develop  in the interim.   Addendum: Dr. Caryl Comes has advised that we continue low dose Amiodarone during this 90 day period.

## 2012-09-04 NOTE — Telephone Encounter (Signed)
RX to d/c Oxygen given to Deana, in medical records, to fax to [facility].

## 2012-09-04 NOTE — Telephone Encounter (Signed)
At office visit today pt was informed he no longer needs O2 but when his sister called [facility] to pick up tank the sister was told they need a order to d/c faxed to [phone]

## 2012-09-09 ENCOUNTER — Other Ambulatory Visit: Payer: Self-pay | Admitting: *Deleted

## 2012-09-09 DIAGNOSIS — I2510 Atherosclerotic heart disease of native coronary artery without angina pectoris: Secondary | ICD-10-CM

## 2012-09-11 ENCOUNTER — Ambulatory Visit
Admission: RE | Admit: 2012-09-11 | Discharge: 2012-09-11 | Disposition: A | Discharge status: Home / Self Care | Payer: BC Managed Care – PPO | Source: Ambulatory Visit | Attending: Cardiothoracic Surgery | Admitting: Cardiothoracic Surgery

## 2012-09-11 ENCOUNTER — Ambulatory Visit (INDEPENDENT_AMBULATORY_CARE_PROVIDER_SITE_OTHER): Payer: Self-pay | Admitting: Cardiothoracic Surgery

## 2012-09-11 ENCOUNTER — Encounter: Payer: Self-pay | Admitting: Cardiothoracic Surgery

## 2012-09-11 VITALS — BP 108/70 | HR 80 | Resp 18 | Ht 70.0 in | Wt 210.0 lb

## 2012-09-11 DIAGNOSIS — I2510 Atherosclerotic heart disease of native coronary artery without angina pectoris: Secondary | ICD-10-CM

## 2012-09-11 DIAGNOSIS — Z951 Presence of aortocoronary bypass graft: Secondary | ICD-10-CM

## 2012-09-11 NOTE — Progress Notes (Signed)
PCP is Garnet Koyanagi, DO Referring Tylen Leverich is [place], Peter M, MD  Chief Complaint  Patient presents with  . Routine Post Op    3 wk f/u with cxr s/p CABG 08/15/12...Marland Kitchenhas seen Truitt Merle, N.P. and will start cardiac rehab next week    HPI: Routine followup after multivessel CABG for severe coronary disease, heart failure with EF of 25 and diabetes.  Patient doing well since discharge. No recurrent angina. No symptoms of CHF. Surgical incisions healing well. Patient had postoperative atrial fibrillation has been on amiodarone under the direction of Dr. Caryl Comes. He is driving and is scheduled to start cardiac rehabilitation within the week.  Past Medical History  Diagnosis Date  . Coronary artery disease     drug eluting stent RCA 2005-EF 30% at that time - s/p CABG x 4  . Cardiomyopathy     alcohol use related  . Asthma   . Hypertension   . Urinary incontinence   . Hyperlipidemia   . History of hypogonadism   . Pneumonia 02/16/2012    "today makes third time in my life"  . OSA on CPAP 02/16/2012    "wear mask sometimes"  . H/O hiatal hernia   . GERD (gastroesophageal reflux disease)   . Migraines 02/16/2012    "used to"  . Anxiety   . Diabetes mellitus type II     Type 2 NIDDM x 7 years  . History of kidney stones 06/2012    Past Surgical History  Procedure Laterality Date  . Tonsillectomy  ~ 1970  . Coronary angioplasty with stent placement  ~ 2003  . Refractive surgery  1990's    bilaterally  . Coronary artery bypass graft N/A 08/15/2012    Procedure: CORONARY ARTERY BYPASS GRAFTING (CABG);  Surgeon: Ivin Poot, MD;  Location: [facility];  Service: Open Heart Surgery;  Laterality: N/A;  Coronary Artery Bypass Grafting Times Four Using Left Internal Mammary Artery and Right Saphenous Leg Vein Harvested Endoscopically    Family History  Problem Relation Age of Onset  . Hypertension Mother   . Diabetes Mother   . Hypertension Father   . Diabetes Father   .  Hyperlipidemia Father     Social History History  Substance Use Topics  . Smoking status: Never Smoker   . Smokeless tobacco: Current User    Types: Chew     Comment: 02/16/2012 smoking cessation materials offered; pt declines  . Alcohol Use: Yes     Comment: socially occasional    Current Outpatient Prescriptions  Medication Sig Dispense Refill  . amiodarone (PACERONE) 200 MG tablet Take 1 tablet (200 mg total) by mouth 2 (two) times daily.  60 tablet  1  . aspirin EC 325 MG tablet Take 1 tablet (325 mg total) by mouth daily.  30 tablet  0  . budesonide-formoterol (SYMBICORT) 160-4.5 MCG/ACT inhaler Inhale 2 puffs into the lungs 2 (two) times daily.  1 Inhaler  0  . HYDROcodone-acetaminophen (NORCO) 7.5-325 MG per tablet Take 1 tablet by mouth every 4 (four) hours as needed for pain.      Marland Kitchen lisinopril (PRINIVIL,ZESTRIL) 5 MG tablet TAKE ONE TABLET BY MOUTH ONE TIME DAILY  30 tablet  1  . metFORMIN (GLUCOPHAGE-XR) 500 MG 24 hr tablet Take 2 tablets (1,000 mg total) by mouth daily with breakfast.  60 tablet  2  . metoprolol (LOPRESSOR) 25 MG tablet Take 1 tablet (25 mg total) by mouth 2 (two) times daily.  60 tablet  6  . pravastatin (PRAVACHOL) 40 MG tablet Take 40 mg by mouth daily.       No current facility-administered medications for this visit.    No Known Allergies  Review of Systems no fever appetite and strength are improving no surgical incision problems  BP 108/70  Pulse 80  Resp 18  Ht 5' 10" (1.778 m)  Wt 210 lb (95.255 kg)  BMI 30.13 kg/m2  SpO2 93% Physical Exam Alert  and comfortable Incisions clean dry Lungs clear No edema Good range of motion of upper extremities Cardiac rhythm regular without murmur or gallop  Diagnostic Tests: Chest x-ray clear with out pleural effusion sternal wires intact   Impression: Doing well one month postop CABG  Plan: Continue current medications, start outpatient cardiac rehabilitation  activity and lifting  limitations discussed with patient. Return as needed

## 2012-09-12 ENCOUNTER — Encounter (HOSPITAL_COMMUNITY)
Admission: RE | Admit: 2012-09-12 | Discharge: 2012-09-12 | Disposition: A | Discharge status: Home / Self Care | Payer: BC Managed Care – PPO | Source: Ambulatory Visit | Attending: Internal Medicine | Admitting: Internal Medicine

## 2012-09-12 DIAGNOSIS — I4891 Unspecified atrial fibrillation: Secondary | ICD-10-CM | POA: Insufficient documentation

## 2012-09-12 DIAGNOSIS — G4733 Obstructive sleep apnea (adult) (pediatric): Secondary | ICD-10-CM | POA: Insufficient documentation

## 2012-09-12 DIAGNOSIS — I2589 Other forms of chronic ischemic heart disease: Secondary | ICD-10-CM | POA: Insufficient documentation

## 2012-09-12 DIAGNOSIS — I2510 Atherosclerotic heart disease of native coronary artery without angina pectoris: Secondary | ICD-10-CM | POA: Insufficient documentation

## 2012-09-12 DIAGNOSIS — I509 Heart failure, unspecified: Secondary | ICD-10-CM | POA: Insufficient documentation

## 2012-09-12 DIAGNOSIS — I10 Essential (primary) hypertension: Secondary | ICD-10-CM | POA: Insufficient documentation

## 2012-09-12 DIAGNOSIS — Z5189 Encounter for other specified aftercare: Secondary | ICD-10-CM | POA: Insufficient documentation

## 2012-09-12 NOTE — Progress Notes (Signed)
Cardiac Rehab Medication Review by a Pharmacist  Does the patient  feel that his/her medications are working for him/her?  yes  Has the patient been experiencing any side effects to the medications prescribed?  no  Does the patient measure his/her own blood pressure or blood glucose at home?  Yes. "I don't take it enough, but I do take my blood sugars"  Does the patient have any problems obtaining medications due to transportation or finances?   no  Understanding of regimen: good Understanding of indications: good Potential of compliance: excellent    Vincent Chandler 09/12/2012 8:18 AM

## 2012-09-16 ENCOUNTER — Encounter (HOSPITAL_COMMUNITY): Payer: BC Managed Care – PPO

## 2012-09-18 ENCOUNTER — Encounter (HOSPITAL_COMMUNITY)
Admission: RE | Admit: 2012-09-18 | Discharge: 2012-09-18 | Disposition: A | Discharge status: Home / Self Care | Payer: BC Managed Care – PPO | Source: Ambulatory Visit | Attending: Internal Medicine | Admitting: Internal Medicine

## 2012-09-18 ENCOUNTER — Ambulatory Visit (INDEPENDENT_AMBULATORY_CARE_PROVIDER_SITE_OTHER): Payer: BC Managed Care – PPO | Admitting: Nurse Practitioner

## 2012-09-18 ENCOUNTER — Encounter: Payer: Self-pay | Admitting: Nurse Practitioner

## 2012-09-18 VITALS — BP 142/86 | HR 84 | Ht 69.0 in | Wt 216.4 lb

## 2012-09-18 DIAGNOSIS — I2589 Other forms of chronic ischemic heart disease: Secondary | ICD-10-CM

## 2012-09-18 DIAGNOSIS — Z951 Presence of aortocoronary bypass graft: Secondary | ICD-10-CM

## 2012-09-18 DIAGNOSIS — I255 Ischemic cardiomyopathy: Secondary | ICD-10-CM

## 2012-09-18 LAB — GLUCOSE, CAPILLARY
Glucose-Capillary: 212 mg/dL — ABNORMAL HIGH (ref 70–99)
Glucose-Capillary: 140 mg/dL — ABNORMAL HIGH (ref 70–99)

## 2012-09-18 MED ORDER — METOPROLOL TARTRATE 50 MG PO TABS: 50.0000 mg | ORAL_TABLET | Freq: Two times a day (BID) | ORAL | Status: DC

## 2012-09-18 NOTE — Progress Notes (Signed)
Vincent Chandler Date of Birth: 1960-07-10 Medical Record #[id number]  History of Present Illness: Vincent Chandler is seen back today for a 2 week check. He is seen for Dr. Caryl Comes. He has an ischemic CM. EF was down to 35% last year and had had an abnormal Myoview last year as well. Then lost to follow up and got off track with taking care of himself.   We were most recently getting him back on his medicines and he was obtaining ObamaCare. His repeat echo showed an EF of 30%. We proceeded with cardiac cath and had to have CABG x 4 in light of his severe 3 vessel disease, DM and poor LV dysfunction. He had LIMA to LAD, SVG to 1st DX, SVG to OM and SVG to distal RCA. His EF at the time of his cath was 25%.   Seen two weeks ago. He was progressing ok. Dr. Caryl Comes did advise that we leave him on low dose Amiodarone during this 90 day period of up titration of medicines and repeat echo.   He comes in today. He is here alone. He is doing ok. Has been released from Dr. Darcey Nora. Started rehab today. Some elevated heart today noted. BP has come up. He is feeling ok. No new issues. No swelling, shortness of breath or heart failure symptoms noted.   Current Outpatient Prescriptions on File Prior to Visit  Medication Sig Dispense Refill  . amiodarone (PACERONE) 200 MG tablet Take 1 tablet (200 mg total) by mouth 2 (two) times daily.  60 tablet  1  . aspirin EC 325 MG tablet Take 1 tablet (325 mg total) by mouth daily.  30 tablet  0  . budesonide-formoterol (SYMBICORT) 160-4.5 MCG/ACT inhaler Inhale 2 puffs into the lungs 2 (two) times daily.  1 Inhaler  0  . HYDROcodone-acetaminophen (NORCO) 7.5-325 MG per tablet Take 1 tablet by mouth every 4 (four) hours as needed for pain.      Marland Kitchen lisinopril (PRINIVIL,ZESTRIL) 5 MG tablet TAKE ONE TABLET BY MOUTH ONE TIME DAILY  30 tablet  1  . metFORMIN (GLUCOPHAGE-XR) 500 MG 24 hr tablet Take 2 tablets (1,000 mg total) by mouth daily with breakfast.  60 tablet  2  . metoprolol  (LOPRESSOR) 25 MG tablet Take 1 tablet (25 mg total) by mouth 2 (two) times daily.  60 tablet  6  . pravastatin (PRAVACHOL) 40 MG tablet Take 40 mg by mouth daily.       No current facility-administered medications on file prior to visit.    No Known Allergies  Past Medical History  Diagnosis Date  . Coronary artery disease     drug eluting stent RCA 2005-EF 30% at that time - s/p CABG x 4  . Cardiomyopathy     alcohol use related  . Asthma   . Hypertension   . Urinary incontinence   . Hyperlipidemia   . History of hypogonadism   . Pneumonia 02/16/2012    "today makes third time in my life"  . OSA on CPAP 02/16/2012    "wear mask sometimes"  . H/O hiatal hernia   . GERD (gastroesophageal reflux disease)   . Migraines 02/16/2012    "used to"  . Anxiety   . Diabetes mellitus type II     Type 2 NIDDM x 7 years  . History of kidney stones 06/2012    Past Surgical History  Procedure Laterality Date  . Tonsillectomy  ~ 1970  . Coronary angioplasty with stent  placement  ~ 2003  . Refractive surgery  1990's    bilaterally  . Coronary artery bypass graft N/A 08/15/2012    Procedure: CORONARY ARTERY BYPASS GRAFTING (CABG);  Surgeon: Ivin Poot, MD;  Location: [facility];  Service: Open Heart Surgery;  Laterality: N/A;  Coronary Artery Bypass Grafting Times Four Using Left Internal Mammary Artery and Right Saphenous Leg Vein Harvested Endoscopically    History  Smoking status  . Never Smoker   Smokeless tobacco  . Current User  . Types: Chew    Comment: 02/16/2012 smoking cessation materials offered; pt declines    History  Alcohol Use  . Yes    Comment: socially occasional    Family History  Problem Relation Age of Onset  . Hypertension Mother   . Diabetes Mother   . Hypertension Father   . Diabetes Father   . Hyperlipidemia Father     Review of Systems: The review of systems is per the HPI.  All other systems were reviewed and are negative.  Physical Exam: BP  142/86  Pulse 84  Ht _0  (1.753 m)  Wt 216 lb 6.4 oz (98.158 kg)  BMI 31.94 kg/m2 Patient is very pleasant and in no acute distress. Skin is warm and dry. Color is normal.  HEENT is unremarkable. Normocephalic/atraumatic. PERRL. Sclera are nonicteric. Neck is supple. No masses. No JVD. Lungs are clear. Cardiac exam shows a regular rate and rhythm. Abdomen is soft. Extremities are without edema. Gait and ROM are intact. No gross neurologic deficits noted.  LABORATORY DATA: Lab Results  Component Value Date   WBC 7.0 09/04/2012   HGB 12.2* 09/04/2012   HCT 37.1* 09/04/2012   PLT 447.0* 09/04/2012   GLUCOSE 124* 09/04/2012   CHOL 173 06/04/2012   TRIG 110 06/04/2012   HDL 32* 06/04/2012   LDLDIRECT 153.1 11/03/2010   LDLCALC 119* 06/04/2012   ALT 18 08/13/2012   AST 16 08/13/2012   NA 139 09/04/2012   K 4.9 09/04/2012   CL 103 09/04/2012   CREATININE 1.2 09/04/2012   BUN 21 09/04/2012   CO2 29 09/04/2012   TSH 2.025 08/17/2012   PSA 0.24 03/10/2010   INR 1.32 08/15/2012   HGBA1C 8.2* 08/13/2012   MICROALBUR 5.0* 03/10/2010   Dg Chest 2 View  09/11/2012  *RADIOLOGY REPORT*  Clinical Data: CABG 3 weeks ago, follow-up  CHEST - 2 VIEW  Comparison: Chest x-[facility] of 08/20/2012  Findings: The previous small effusions have resolved.  Aeration of the lungs has improved.  Cardiomegaly is stable.  Median sternotomy sutures are noted.  IMPRESSION: Improved aeration with resolution of small effusions and mild basilar atelectasis.   Original Report Authenticated By: Ivar Drape, M.D.    Wt Readings from Last 3 Encounters:  09/18/12 216 lb 6.4 oz (98.158 kg)  09/12/12 218 lb 11.1 oz (99.2 kg)  09/11/12 210 lb (95.255 kg)    Assessment / Plan:  1. Severe 3 VD - s/p CABG x 4 - released by [facility] - starting rehab - doing ok.   2. Severe LV dysfunction - EF of 25% - trying to titrate medicines with plans for repeat echo 90 days post op. Leaving on amiodarone during this time period. Metoprolol is increased today to 50 mg BID. Will  plan to see him in 2 weeks and increase his ACE next.   3. HTN - Metoprolol is increased today.   4. Post op atrial fib - in sinus today - leaving on amiodarone  for now per Dr. Olin Pia recommendation.   Patient is agreeable to this plan and will call if any problems develop in the interim.   Burtis Junes, RN, ANP-C [facility] [address] [place], [place]  [address]

## 2012-09-18 NOTE — Progress Notes (Signed)
Pt started cardiac rehab today.  Pt tolerated light exercise without difficulty. Telemetry Sinus with downward QRS rate 82.  Will continue to monitor the patient throughout  the program. Patient took today's exercise flow sheet to Dr Olin Pia office for review.

## 2012-09-18 NOTE — Patient Instructions (Addendum)
We are going to increase the Metoprolol to 50 mg two times a day. You can take 2 of your 25 mg tabs two times a day and use them up  Stay on everything else  I will see you in 2 weeks - with plans to increase the Lisinopril then  Call the [place] office at [phone] if you have any questions, problems or concerns.

## 2012-09-20 ENCOUNTER — Encounter (HOSPITAL_COMMUNITY)
Admission: RE | Admit: 2012-09-20 | Discharge: 2012-09-20 | Disposition: A | Discharge status: Home / Self Care | Payer: BC Managed Care – PPO | Source: Ambulatory Visit | Attending: Internal Medicine | Admitting: Internal Medicine

## 2012-09-20 NOTE — Progress Notes (Signed)
Tee said that Truitt Merle NP increased his metoprolol to 50 mg twice a day. Will continue to monitor BP.

## 2012-09-23 ENCOUNTER — Encounter (HOSPITAL_COMMUNITY)
Admission: RE | Admit: 2012-09-23 | Discharge: 2012-09-23 | Disposition: A | Discharge status: Home / Self Care | Payer: BC Managed Care – PPO | Source: Ambulatory Visit | Attending: Internal Medicine | Admitting: Internal Medicine

## 2012-09-23 LAB — GLUCOSE, CAPILLARY
Glucose-Capillary: 284 mg/dL — ABNORMAL HIGH (ref 70–99)
Glucose-Capillary: 152 mg/dL — ABNORMAL HIGH (ref 70–99)

## 2012-09-25 ENCOUNTER — Encounter (HOSPITAL_COMMUNITY)
Admission: RE | Admit: 2012-09-25 | Discharge: 2012-09-25 | Disposition: A | Discharge status: Home / Self Care | Payer: BC Managed Care – PPO | Source: Ambulatory Visit | Attending: Internal Medicine | Admitting: Internal Medicine

## 2012-09-27 ENCOUNTER — Encounter (HOSPITAL_COMMUNITY): Payer: BC Managed Care – PPO

## 2012-09-30 ENCOUNTER — Encounter (HOSPITAL_COMMUNITY)
Admission: RE | Admit: 2012-09-30 | Discharge: 2012-09-30 | Disposition: A | Discharge status: Home / Self Care | Payer: BC Managed Care – PPO | Source: Ambulatory Visit | Attending: Internal Medicine | Admitting: Internal Medicine

## 2012-09-30 LAB — GLUCOSE, CAPILLARY: Glucose-Capillary: 156 mg/dL — ABNORMAL HIGH (ref 70–99)

## 2012-10-01 ENCOUNTER — Ambulatory Visit (INDEPENDENT_AMBULATORY_CARE_PROVIDER_SITE_OTHER): Payer: BC Managed Care – PPO | Admitting: Nurse Practitioner

## 2012-10-01 ENCOUNTER — Encounter: Payer: Self-pay | Admitting: Nurse Practitioner

## 2012-10-01 VITALS — BP 140/88 | HR 76 | Ht 70.0 in | Wt 218.4 lb

## 2012-10-01 DIAGNOSIS — I2589 Other forms of chronic ischemic heart disease: Secondary | ICD-10-CM

## 2012-10-01 DIAGNOSIS — I255 Ischemic cardiomyopathy: Secondary | ICD-10-CM

## 2012-10-01 MED ORDER — AMIODARONE HCL 200 MG PO TABS: 200.0000 mg | ORAL_TABLET | Freq: Every day | ORAL | Status: DC

## 2012-10-01 MED ORDER — LISINOPRIL 10 MG PO TABS: 10.0000 mg | ORAL_TABLET | Freq: Every day | ORAL | Status: DC

## 2012-10-01 NOTE — Patient Instructions (Addendum)
Stay on your current medicines but increase the Lisinopril to 10 mg a day - you can take 2 of the 5 mg tablets to equal this dose. The prescription for the 10 mg tablet is at [facility]  Decrease your amiodarone to just one pill a day  Stay active and walk on the days that you are not at Cardiac Rehab  I will see you in 3 weeks  Call the [place] office at [phone] if you have any questions, problems or concerns.

## 2012-10-01 NOTE — Progress Notes (Signed)
Vincent Chandler Date of Birth: 04/05/1961 Medical Record #[id number]  History of Present Illness: Vincent Chandler is seen back today for a 2 week check. He is seen for Dr. Caryl Comes. He has an ischemic CM. EF was down to 35% last year and had had an abnormal Myoview last year as well. Then lost to follow up and got off track with taking care of himself.   We were most recently getting him back on his medicines and he was obtaining ObamaCare. His repeat echo showed an EF of 30%. We proceeded with cardiac cath and had to have CABG x 4 in light of his severe 3 vessel disease, DM and poor LV dysfunction. He had LIMA to LAD, SVG to 1st DX, SVG to OM and SVG to distal RCA. His EF at the time of his cath was 25%.   Seen two weeks ago. He was progressing ok. Dr. Caryl Comes did advise that we leave him on low dose Amiodarone during this 90 day period of up titration of medicines and repeat echo. Metoprolol was increased at his last visit with plans to try and increase the ACE on return.   He comes back today. He is here alone. Doing ok. No CHF symptoms. Tolerating his medicines. Doing well at rehab. Not walking much on his "non rehab" days. Not dizzy or lightheaded. Weight has been stable.  Current Outpatient Prescriptions on File Prior to Visit  Medication Sig Dispense Refill  . amiodarone (PACERONE) 200 MG tablet Take 1 tablet (200 mg total) by mouth 2 (two) times daily.  60 tablet  1  . aspirin EC 325 MG tablet Take 1 tablet (325 mg total) by mouth daily.  30 tablet  0  . budesonide-formoterol (SYMBICORT) 160-4.5 MCG/ACT inhaler Inhale 2 puffs into the lungs 2 (two) times daily.  1 Inhaler  0  . lisinopril (PRINIVIL,ZESTRIL) 5 MG tablet TAKE ONE TABLET BY MOUTH ONE TIME DAILY  30 tablet  1  . metFORMIN (GLUCOPHAGE-XR) 500 MG 24 hr tablet Take 2 tablets (1,000 mg total) by mouth daily with breakfast.  60 tablet  2  . metoprolol (LOPRESSOR) 50 MG tablet Take 1 tablet (50 mg total) by mouth 2 (two) times daily.  60  tablet  6  . pravastatin (PRAVACHOL) 40 MG tablet Take 40 mg by mouth daily.       No current facility-administered medications on file prior to visit.    No Known Allergies  Past Medical History  Diagnosis Date  . Coronary artery disease     drug eluting stent RCA 2005-EF 30% at that time - s/p CABG x 4  . Cardiomyopathy     alcohol use related  . Asthma   . Hypertension   . Urinary incontinence   . Hyperlipidemia   . History of hypogonadism   . Pneumonia 02/16/2012    "today makes third time in my life"  . OSA on CPAP 02/16/2012    "wear mask sometimes"  . H/O hiatal hernia   . GERD (gastroesophageal reflux disease)   . Migraines 02/16/2012    "used to"  . Anxiety   . Diabetes mellitus type II     Type 2 NIDDM x 7 years  . History of kidney stones 06/2012    Past Surgical History  Procedure Laterality Date  . Tonsillectomy  ~ 1970  . Coronary angioplasty with stent placement  ~ 2003  . Refractive surgery  1990's    bilaterally  . Coronary artery bypass graft  N/A 08/15/2012    Procedure: CORONARY ARTERY BYPASS GRAFTING (CABG);  Surgeon: Ivin Poot, MD;  Location: [facility];  Service: Open Heart Surgery;  Laterality: N/A;  Coronary Artery Bypass Grafting Times Four Using Left Internal Mammary Artery and Right Saphenous Leg Vein Harvested Endoscopically    History  Smoking status  . Never Smoker   Smokeless tobacco  . Current User  . Types: Chew    Comment: 02/16/2012 smoking cessation materials offered; pt declines    History  Alcohol Use  . Yes    Comment: socially occasional    Family History  Problem Relation Age of Onset  . Hypertension Mother   . Diabetes Mother   . Hypertension Father   . Diabetes Father   . Hyperlipidemia Father     Review of Systems: The review of systems is per the HPI.  All other systems were reviewed and are negative.  Physical Exam: BP 140/88  Pulse 76  Ht _0  (1.778 m)  Wt 218 lb 6.4 oz (99.066 kg)  BMI 31.34  kg/m2 Patient is very pleasant and in no acute distress. Skin is warm and dry. Color is normal.  HEENT is unremarkable. Normocephalic/atraumatic. PERRL. Sclera are nonicteric. Neck is supple. No masses. No JVD. Lungs are clear. Cardiac exam shows a regular rate and rhythm. Abdomen is soft. Extremities are without edema. Gait and ROM are intact. No gross neurologic deficits noted.  LABORATORY DATA:  Lab Results  Component Value Date   WBC 7.0 09/04/2012   HGB 12.2* 09/04/2012   HCT 37.1* 09/04/2012   PLT 447.0* 09/04/2012   GLUCOSE 124* 09/04/2012   CHOL 173 06/04/2012   TRIG 110 06/04/2012   HDL 32* 06/04/2012   LDLDIRECT 153.1 11/03/2010   LDLCALC 119* 06/04/2012   ALT 18 08/13/2012   AST 16 08/13/2012   NA 139 09/04/2012   K 4.9 09/04/2012   CL 103 09/04/2012   CREATININE 1.2 09/04/2012   BUN 21 09/04/2012   CO2 29 09/04/2012   TSH 2.025 08/17/2012   PSA 0.24 03/10/2010   INR 1.32 08/15/2012   HGBA1C 8.2* 08/13/2012   MICROALBUR 5.0* 03/10/2010    Assessment / Plan: 1. Severe 3 VD - s/p CABG . 4 - in cardiac rehab - progressing nicely.   2. Severe LV dysfunction - will increase the Lisinopril to 10 mg a day. See him back in 3 weeks. May consider Aldactone on return. Check BMET on return.   3. HTN - increasing the lisinopril to day.   4. Post op atrial fib - on amiodarone which we will continue but I have cut him back to just 200 mg a day.   Patient is agreeable to this plan and will call if any problems develop in the interim.   Burtis Junes, RN, ANP-C [facility] [address] [place], [place]  [address]

## 2012-10-02 ENCOUNTER — Encounter (HOSPITAL_COMMUNITY): Payer: BC Managed Care – PPO

## 2012-10-04 ENCOUNTER — Encounter (HOSPITAL_COMMUNITY)
Admission: RE | Admit: 2012-10-04 | Discharge: 2012-10-04 | Disposition: A | Discharge status: Home / Self Care | Payer: BC Managed Care – PPO | Source: Ambulatory Visit | Attending: Internal Medicine | Admitting: Internal Medicine

## 2012-10-04 DIAGNOSIS — I4891 Unspecified atrial fibrillation: Secondary | ICD-10-CM | POA: Insufficient documentation

## 2012-10-04 DIAGNOSIS — Z5189 Encounter for other specified aftercare: Secondary | ICD-10-CM | POA: Insufficient documentation

## 2012-10-04 DIAGNOSIS — I509 Heart failure, unspecified: Secondary | ICD-10-CM | POA: Insufficient documentation

## 2012-10-04 DIAGNOSIS — G4733 Obstructive sleep apnea (adult) (pediatric): Secondary | ICD-10-CM | POA: Insufficient documentation

## 2012-10-04 DIAGNOSIS — I10 Essential (primary) hypertension: Secondary | ICD-10-CM | POA: Insufficient documentation

## 2012-10-04 DIAGNOSIS — I2510 Atherosclerotic heart disease of native coronary artery without angina pectoris: Secondary | ICD-10-CM | POA: Insufficient documentation

## 2012-10-04 DIAGNOSIS — I2589 Other forms of chronic ischemic heart disease: Secondary | ICD-10-CM | POA: Insufficient documentation

## 2012-10-04 LAB — GLUCOSE, CAPILLARY: Glucose-Capillary: 150 mg/dL — ABNORMAL HIGH (ref 70–99)

## 2012-10-04 NOTE — Progress Notes (Signed)
Blood pressure 142/90 on the airdyne.  Patient switched to the track repeat blood pressure 122/80. Vincent Chandler took his blood pressuremedications today.  Will continue to monitor.

## 2012-10-07 ENCOUNTER — Encounter (HOSPITAL_COMMUNITY): Payer: BC Managed Care – PPO

## 2012-10-08 ENCOUNTER — Other Ambulatory Visit: Payer: Self-pay | Admitting: Family Medicine

## 2012-10-08 ENCOUNTER — Other Ambulatory Visit: Payer: Self-pay | Admitting: Physician Assistant

## 2012-10-09 ENCOUNTER — Encounter (HOSPITAL_COMMUNITY)
Admission: RE | Admit: 2012-10-09 | Discharge: 2012-10-09 | Disposition: A | Discharge status: Home / Self Care | Payer: BC Managed Care – PPO | Source: Ambulatory Visit | Attending: Internal Medicine | Admitting: Internal Medicine

## 2012-10-10 ENCOUNTER — Other Ambulatory Visit: Payer: Self-pay

## 2012-10-10 NOTE — Telephone Encounter (Signed)
amiodarone (PACERONE) 200 MG tablet 30 tablet 1 10/01/2012     Take 1 tablet (200 mg total) by mouth daily. - Oral All Charges for This Encounter    Code Description Service Date Service Provider Modifiers Quantity    [phone] PR OFFICE OUTPATIENT VISIT 15 MINUTES 10/01/2012 Burtis Junes, NP   1     Patient Instructions    Stay on your current medicines but increase the Lisinopril to 10 mg a day - you can take 2 of the 5 mg tablets to equal this dose. The prescription for the 10 mg tablet is at [facility]  Decrease your amiodarone to just one pill a day

## 2012-10-11 ENCOUNTER — Encounter (HOSPITAL_COMMUNITY)
Admission: RE | Admit: 2012-10-11 | Discharge: 2012-10-11 | Disposition: A | Discharge status: Home / Self Care | Payer: BC Managed Care – PPO | Source: Ambulatory Visit | Attending: Internal Medicine | Admitting: Internal Medicine

## 2012-10-14 ENCOUNTER — Encounter (HOSPITAL_COMMUNITY): Admission: RE | Admit: 2012-10-14 | Payer: BC Managed Care – PPO | Source: Ambulatory Visit

## 2012-10-16 ENCOUNTER — Encounter (HOSPITAL_COMMUNITY): Payer: BC Managed Care – PPO

## 2012-10-18 ENCOUNTER — Encounter (HOSPITAL_COMMUNITY)
Admission: RE | Admit: 2012-10-18 | Discharge: 2012-10-18 | Disposition: A | Discharge status: Home / Self Care | Payer: BC Managed Care – PPO | Source: Ambulatory Visit | Attending: Internal Medicine | Admitting: Internal Medicine

## 2012-10-18 NOTE — Progress Notes (Signed)
Vincent Chandler 52 y.o. male Nutrition Note Spoke with pt.  Nutrition Plan and Nutrition Survey goals reviewed with pt. Pt is following Step 1 of the Therapeutic Lifestyle Changes diet. Pt wants to lose wt "but I've gained 2 lbs." Pt has not currently been trying to lose wt. Wt loss tips reviewed. Pt eats out "90% of the time." Per pt, his girlfriend works third shift so they go out for dinner. This Probation officer encouraged pt to make more meals at home (e.g. Prepare weekday meals on the weekend). Pt is diabetic. Last A1c indicates blood glucose not well-controlled. Pt unaware of what an A1c was. Pt feels he has not been educated about his DM. Pt states he has difficulty combining his low-sodium diet with his heart healthy and DM diets. Pt's concern re: multiple diets discussed. This Probation officer went over Diabetes Education test results. Pt expressed understanding of the information reviewed. Pt aware of nutrition education classes offered and plans on attending nutrition classes.  Nutrition Diagnosis   Food-and nutrition-related knowledge deficit related to lack of exposure to information as related to diagnosis of: ? CVD ? DM (A1c 8.2) ?    Obesity related to excessive energy intake as evidenced by a BMI of 30.7  Nutrition RX/ Estimated Daily Nutrition Needs for: wt loss  1600-2100 Kcal, 45-55 gm fat, 10-14 gm sat fat, 1.6-2.1 gm trans-fat, <1500 mg sodium, 175-250 gm CHO   Nutrition Intervention   Pt's individual nutrition plan reviewed with pt.   Benefits of adopting Therapeutic Lifestyle Changes discussed when Medficts reviewed.   Pt to attend the Portion Distortion class   Pt to attend the  ? Nutrition I class                     ? Nutrition II class        ? Diabetes Blitz class       ? Diabetes Q & A class - met 10/11/12   Pt given handouts for: ? A Guide to [employer] For a Healthy Heart  ? 1800 kcal, 5-day menu ideas   Continue client-centered nutrition education by RD, as part of  interdisciplinary care. Goal(s)   Pt to identify and limit food sources of saturated fat, trans fat, and cholesterol   Pt to identify food quantities necessary to achieve: ? wt loss to a goal wt of 194-212 lb (88.3-96.5 kg) at graduation from cardiac rehab.    CBG concentrations in the normal range or as close to normal as is safely possible. Monitor and Evaluate progress toward nutrition goal with team. Nutrition Risk: Change to Moderate   [name], M.Ed, RD, LDN, CDE 10/18/2012 10:46 AM

## 2012-10-21 ENCOUNTER — Encounter (HOSPITAL_COMMUNITY)
Admission: RE | Admit: 2012-10-21 | Discharge: 2012-10-21 | Disposition: A | Discharge status: Home / Self Care | Payer: BC Managed Care – PPO | Source: Ambulatory Visit | Attending: Internal Medicine | Admitting: Internal Medicine

## 2012-10-22 ENCOUNTER — Encounter: Payer: Self-pay | Admitting: Nurse Practitioner

## 2012-10-22 ENCOUNTER — Ambulatory Visit (INDEPENDENT_AMBULATORY_CARE_PROVIDER_SITE_OTHER): Payer: BC Managed Care – PPO | Admitting: Nurse Practitioner

## 2012-10-22 VITALS — BP 160/78 | HR 88 | Ht 69.0 in | Wt 223.0 lb

## 2012-10-22 DIAGNOSIS — I519 Heart disease, unspecified: Secondary | ICD-10-CM

## 2012-10-22 LAB — BASIC METABOLIC PANEL
BUN: 17 mg/dL (ref 6–23)
CO2: 26 mEq/L (ref 19–32)
Calcium: 8.8 mg/dL (ref 8.4–10.5)
Chloride: 102 mEq/L (ref 96–112)
Creatinine, Ser: 1.2 mg/dL (ref 0.4–1.5)
GFR: 71.19 mL/min (ref >60.00)
Glucose, Bld: 209 mg/dL — ABNORMAL HIGH (ref 70–99)
Potassium: 3.9 mEq/L (ref 3.5–5.1)
Sodium: 136 mEq/L (ref 135–145)

## 2012-10-22 MED ORDER — PRAVASTATIN SODIUM 40 MG PO TABS: 40.0000 mg | ORAL_TABLET | Freq: Every day | ORAL | Status: DC

## 2012-10-22 MED ORDER — AMIODARONE HCL 200 MG PO TABS: 200.0000 mg | ORAL_TABLET | Freq: Every day | ORAL | Status: DC

## 2012-10-22 MED ORDER — METOPROLOL TARTRATE 100 MG PO TABS: 100.0000 mg | ORAL_TABLET | Freq: Two times a day (BID) | ORAL | Status: DC

## 2012-10-22 MED ORDER — BUDESONIDE-FORMOTEROL FUMARATE 160-4.5 MCG/ACT IN AERO: 2.0000 | INHALATION_SPRAY | Freq: Two times a day (BID) | RESPIRATORY_TRACT | Status: DC

## 2012-10-22 NOTE — Patient Instructions (Addendum)
I have refilled the Pravachol, amiodarone and your symbicort  Increase the Metoprolol to 100 mg two times day  We need an echo after May 13th and then a follow up visit with Dr. Caryl Comes  Come fasting for your visit with Dr. Caryl Comes so we can get labs done  Call the [place] office at [phone] if you have any questions, problems or concerns.

## 2012-10-22 NOTE — Progress Notes (Signed)
Ray Church Hagner Date of Birth: 02/26/1961 Medical Record #[id number]  History of Present Illness: Shaunak is seen back today for a 3 week check. He is seen for Dr. Caryl Comes. He has an ischemic CM. EF was down to 35% in 2012 and had had an abnormal Myoview in 2012 as well. Then lost to follow up and got off track with taking care of himself.   We were most recently getting him back on his medicines and he was obtaining ObamaCare. His repeat echo showed an EF of 30%. We proceeded with cardiac cath and had to have CABG x 4 (August 15, 2012) in light of his severe 3 vessel disease, DM and poor LV dysfunction. He had LIMA to LAD, SVG to 1st DX, SVG to OM and SVG to distal RCA. His EF at the time of his cath was 25%.   Dr. Caryl Comes has advised that we leave him on low dose Amiodarone during this 90 day period of up titration of medicines and repeat echo. We are now retitrating his medicines back up.   He comes in today. He is here alone. Doing well. Forgot his medicines this morning. BP is up. Rehab flowsheet reviewed. We still have room to titrate his medicines. He feels good clinically and has no complaint. Not exercising as much on his non rehab days. Weight is up and he admits that he is eating more.    Current Outpatient Prescriptions on File Prior to Visit  Medication Sig Dispense Refill  . aspirin EC 325 MG tablet Take 1 tablet (325 mg total) by mouth daily.  30 tablet  0  . HYDROcodone-acetaminophen (NORCO) 7.5-325 MG per tablet Take 1 tablet by mouth as needed for pain.      Marland Kitchen lisinopril (PRINIVIL,ZESTRIL) 10 MG tablet Take 1 tablet (10 mg total) by mouth daily.  90 tablet  3  . metFORMIN (GLUCOPHAGE-XR) 500 MG 24 hr tablet TAKE TWO TABLETS BY MOUTH DAILY WITH BREAKFAST  60 tablet  2   No current facility-administered medications on file prior to visit.    No Known Allergies  Past Medical History  Diagnosis Date  . Coronary artery disease     drug eluting stent RCA 2005-EF 30% at that  time - s/p CABG x 4  . Cardiomyopathy     alcohol use related  . Asthma   . Hypertension   . Urinary incontinence   . Hyperlipidemia   . History of hypogonadism   . Pneumonia 02/16/2012    "today makes third time in my life"  . OSA on CPAP 02/16/2012    "wear mask sometimes"  . H/O hiatal hernia   . GERD (gastroesophageal reflux disease)   . Migraines 02/16/2012    "used to"  . Anxiety   . Diabetes mellitus type II     Type 2 NIDDM x 7 years  . History of kidney stones 06/2012    Past Surgical History  Procedure Laterality Date  . Tonsillectomy  ~ 1970  . Coronary angioplasty with stent placement  ~ 2003  . Refractive surgery  1990's    bilaterally  . Coronary artery bypass graft N/A 08/15/2012    Procedure: CORONARY ARTERY BYPASS GRAFTING (CABG);  Surgeon: Ivin Poot, MD;  Location: [facility];  Service: Open Heart Surgery;  Laterality: N/A;  Coronary Artery Bypass Grafting Times Four Using Left Internal Mammary Artery and Right Saphenous Leg Vein Harvested Endoscopically    History  Smoking status  . Never Smoker  Smokeless tobacco  . Current User  . Types: Chew    Comment: 02/16/2012 smoking cessation materials offered; pt declines    History  Alcohol Use  . Yes    Comment: socially occasional    Family History  Problem Relation Age of Onset  . Hypertension Mother   . Diabetes Mother   . Hypertension Father   . Diabetes Father   . Hyperlipidemia Father     Review of Systems: The review of systems is per the HPI.  All other systems were reviewed and are negative.  Physical Exam: BP 160/78  Pulse 88  Ht _0  (1.753 m)  Wt 223 lb (101.152 kg)  BMI 32.92 kg/m2 Patient is very pleasant and in no acute distress. Skin is warm and dry. Color is normal.  HEENT is unremarkable. Normocephalic/atraumatic. PERRL. Sclera are nonicteric. Neck is supple. No masses. No JVD. Lungs are clear. Cardiac exam shows a regular rate and rhythm. Abdomen is soft. Extremities are  without edema. Gait and ROM are intact. No gross neurologic deficits noted.  LABORATORY DATA: BMET is pending  Lab Results  Component Value Date   WBC 7.0 09/04/2012   HGB 12.2* 09/04/2012   HCT 37.1* 09/04/2012   PLT 447.0* 09/04/2012   GLUCOSE 124* 09/04/2012   CHOL 173 06/04/2012   TRIG 110 06/04/2012   HDL 32* 06/04/2012   LDLDIRECT 153.1 11/03/2010   LDLCALC 119* 06/04/2012   ALT 18 08/13/2012   AST 16 08/13/2012   NA 139 09/04/2012   K 4.9 09/04/2012   CL 103 09/04/2012   CREATININE 1.2 09/04/2012   BUN 21 09/04/2012   CO2 29 09/04/2012   TSH 2.025 08/17/2012   PSA 0.24 03/10/2010   INR 1.32 08/15/2012   HGBA1C 8.2* 08/13/2012   MICROALBUR 5.0* 03/10/2010    Assessment / Plan:  1. 1. Severe 3 VD - s/p CABG . 4 - in cardiac rehab - progressing nicely. Over 2 months out from his surgery.   2. Severe LV dysfunction - will increase the Metoprolol to 100 mg BID. Check BMET today. Needs his echo after May 13th and will get him back to see Dr. Caryl Comes for discussion and possible ICD implant if EF has not recovered.   3. HTN - no medicines taken today. Metoprolol is increased.   4. Post op atrial fib - no recurrence but leaving on amiodarone until after the echo is complete.   Patient is agreeable to this plan and will call if any problems develop in the interim.   Burtis Junes, RN, ANP-C  [facility]  [address] [place]  [place], [place] [address]

## 2012-10-23 ENCOUNTER — Encounter (HOSPITAL_COMMUNITY)
Admission: RE | Admit: 2012-10-23 | Discharge: 2012-10-23 | Disposition: A | Discharge status: Home / Self Care | Payer: BC Managed Care – PPO | Source: Ambulatory Visit | Attending: Internal Medicine | Admitting: Internal Medicine

## 2012-10-23 LAB — GLUCOSE, CAPILLARY: Glucose-Capillary: 154 mg/dL — ABNORMAL HIGH (ref 70–99)

## 2012-10-25 ENCOUNTER — Encounter (HOSPITAL_COMMUNITY): Payer: BC Managed Care – PPO

## 2012-10-28 ENCOUNTER — Encounter (HOSPITAL_COMMUNITY)
Admission: RE | Admit: 2012-10-28 | Discharge: 2012-10-28 | Disposition: A | Discharge status: Home / Self Care | Payer: BC Managed Care – PPO | Source: Ambulatory Visit | Attending: Internal Medicine | Admitting: Internal Medicine

## 2012-10-28 LAB — GLUCOSE, CAPILLARY: Glucose-Capillary: 170 mg/dL — ABNORMAL HIGH (ref 70–99)

## 2012-10-30 ENCOUNTER — Encounter (HOSPITAL_COMMUNITY)
Admission: RE | Admit: 2012-10-30 | Discharge: 2012-10-30 | Disposition: A | Discharge status: Home / Self Care | Payer: BC Managed Care – PPO | Source: Ambulatory Visit | Attending: Internal Medicine | Admitting: Internal Medicine

## 2012-11-01 ENCOUNTER — Encounter (HOSPITAL_COMMUNITY): Payer: BC Managed Care – PPO

## 2012-11-04 ENCOUNTER — Encounter (HOSPITAL_COMMUNITY)
Admission: RE | Admit: 2012-11-04 | Discharge: 2012-11-04 | Disposition: A | Discharge status: Home / Self Care | Payer: BC Managed Care – PPO | Source: Ambulatory Visit | Attending: Internal Medicine | Admitting: Internal Medicine

## 2012-11-04 DIAGNOSIS — Z5189 Encounter for other specified aftercare: Secondary | ICD-10-CM | POA: Insufficient documentation

## 2012-11-04 DIAGNOSIS — I10 Essential (primary) hypertension: Secondary | ICD-10-CM | POA: Insufficient documentation

## 2012-11-04 DIAGNOSIS — G4733 Obstructive sleep apnea (adult) (pediatric): Secondary | ICD-10-CM | POA: Insufficient documentation

## 2012-11-04 DIAGNOSIS — I4891 Unspecified atrial fibrillation: Secondary | ICD-10-CM | POA: Insufficient documentation

## 2012-11-04 DIAGNOSIS — I509 Heart failure, unspecified: Secondary | ICD-10-CM | POA: Insufficient documentation

## 2012-11-04 DIAGNOSIS — I2589 Other forms of chronic ischemic heart disease: Secondary | ICD-10-CM | POA: Insufficient documentation

## 2012-11-04 DIAGNOSIS — I2510 Atherosclerotic heart disease of native coronary artery without angina pectoris: Secondary | ICD-10-CM | POA: Insufficient documentation

## 2012-11-04 LAB — GLUCOSE, CAPILLARY: Glucose-Capillary: 206 mg/dL — ABNORMAL HIGH (ref 70–99)

## 2012-11-06 ENCOUNTER — Encounter (HOSPITAL_COMMUNITY): Payer: BC Managed Care – PPO

## 2012-11-08 ENCOUNTER — Encounter (HOSPITAL_COMMUNITY)
Admission: RE | Admit: 2012-11-08 | Discharge: 2012-11-08 | Disposition: A | Discharge status: Home / Self Care | Payer: BC Managed Care – PPO | Source: Ambulatory Visit | Attending: Internal Medicine | Admitting: Internal Medicine

## 2012-11-11 ENCOUNTER — Encounter (HOSPITAL_COMMUNITY)
Admission: RE | Admit: 2012-11-11 | Discharge: 2012-11-11 | Disposition: A | Discharge status: Home / Self Care | Payer: BC Managed Care – PPO | Source: Ambulatory Visit | Attending: Internal Medicine | Admitting: Internal Medicine

## 2012-11-13 ENCOUNTER — Encounter (HOSPITAL_COMMUNITY)
Admission: RE | Admit: 2012-11-13 | Discharge: 2012-11-13 | Disposition: A | Discharge status: Home / Self Care | Payer: BC Managed Care – PPO | Source: Ambulatory Visit | Attending: Internal Medicine | Admitting: Internal Medicine

## 2012-11-13 LAB — GLUCOSE, CAPILLARY: Glucose-Capillary: 128 mg/dL — ABNORMAL HIGH (ref 70–99)

## 2012-11-15 ENCOUNTER — Encounter (HOSPITAL_COMMUNITY): Payer: BC Managed Care – PPO

## 2012-11-18 ENCOUNTER — Encounter (HOSPITAL_COMMUNITY)
Admission: RE | Admit: 2012-11-18 | Discharge: 2012-11-18 | Disposition: A | Discharge status: Home / Self Care | Payer: BC Managed Care – PPO | Source: Ambulatory Visit | Attending: Internal Medicine | Admitting: Internal Medicine

## 2012-11-20 ENCOUNTER — Encounter (HOSPITAL_COMMUNITY)
Admission: RE | Admit: 2012-11-20 | Discharge: 2012-11-20 | Disposition: A | Discharge status: Home / Self Care | Payer: BC Managed Care – PPO | Source: Ambulatory Visit | Attending: Internal Medicine | Admitting: Internal Medicine

## 2012-11-21 ENCOUNTER — Ambulatory Visit (HOSPITAL_COMMUNITY): Payer: BC Managed Care – PPO | Attending: Nurse Practitioner | Admitting: Radiology

## 2012-11-21 DIAGNOSIS — I2510 Atherosclerotic heart disease of native coronary artery without angina pectoris: Secondary | ICD-10-CM | POA: Insufficient documentation

## 2012-11-21 DIAGNOSIS — E119 Type 2 diabetes mellitus without complications: Secondary | ICD-10-CM | POA: Insufficient documentation

## 2012-11-21 DIAGNOSIS — I2589 Other forms of chronic ischemic heart disease: Secondary | ICD-10-CM | POA: Insufficient documentation

## 2012-11-21 DIAGNOSIS — J45909 Unspecified asthma, uncomplicated: Secondary | ICD-10-CM | POA: Insufficient documentation

## 2012-11-21 DIAGNOSIS — G4733 Obstructive sleep apnea (adult) (pediatric): Secondary | ICD-10-CM | POA: Insufficient documentation

## 2012-11-21 DIAGNOSIS — I519 Heart disease, unspecified: Secondary | ICD-10-CM | POA: Insufficient documentation

## 2012-11-21 DIAGNOSIS — E785 Hyperlipidemia, unspecified: Secondary | ICD-10-CM | POA: Insufficient documentation

## 2012-11-21 DIAGNOSIS — I509 Heart failure, unspecified: Secondary | ICD-10-CM | POA: Insufficient documentation

## 2012-11-21 DIAGNOSIS — I10 Essential (primary) hypertension: Secondary | ICD-10-CM | POA: Insufficient documentation

## 2012-11-21 NOTE — Progress Notes (Signed)
Echocardiogram performed.  

## 2012-11-22 ENCOUNTER — Encounter (HOSPITAL_COMMUNITY): Payer: BC Managed Care – PPO

## 2012-11-25 ENCOUNTER — Encounter (HOSPITAL_COMMUNITY): Payer: BC Managed Care – PPO

## 2012-11-26 ENCOUNTER — Other Ambulatory Visit (INDEPENDENT_AMBULATORY_CARE_PROVIDER_SITE_OTHER): Payer: BC Managed Care – PPO

## 2012-11-26 ENCOUNTER — Other Ambulatory Visit: Payer: Self-pay | Admitting: *Deleted

## 2012-11-26 DIAGNOSIS — E785 Hyperlipidemia, unspecified: Secondary | ICD-10-CM

## 2012-11-26 LAB — LIPID PANEL
Cholesterol: 167 mg/dL (ref 0–200)
HDL: 40.4 mg/dL (ref >39.00)
LDL Cholesterol: 100 mg/dL — ABNORMAL HIGH (ref 0–99)
Total CHOL/HDL Ratio: 4
Triglycerides: 133 mg/dL (ref 0.0–149.0)
VLDL: 26.6 mg/dL (ref 0.0–40.0)

## 2012-11-26 LAB — HEPATIC FUNCTION PANEL
ALT: 19 U/L (ref 0–53)
AST: 18 U/L (ref 0–37)
Albumin: 3.7 g/dL (ref 3.5–5.2)
Alkaline Phosphatase: 55 U/L (ref 39–117)
Bilirubin, Direct: 0.1 mg/dL (ref 0.0–0.3)
Total Bilirubin: 0.7 mg/dL (ref 0.3–1.2)
Total Protein: 7.5 g/dL (ref 6.0–8.3)

## 2012-11-26 LAB — BASIC METABOLIC PANEL
BUN: 19 mg/dL (ref 6–23)
CO2: 28 mEq/L (ref 19–32)
Calcium: 9.2 mg/dL (ref 8.4–10.5)
Chloride: 105 mEq/L (ref 96–112)
Creatinine, Ser: 1.1 mg/dL (ref 0.4–1.5)
GFR: 72.62 mL/min (ref >60.00)
Glucose, Bld: 152 mg/dL — ABNORMAL HIGH (ref 70–99)
Potassium: 4.2 mEq/L (ref 3.5–5.1)
Sodium: 141 mEq/L (ref 135–145)

## 2012-11-27 ENCOUNTER — Encounter (HOSPITAL_COMMUNITY): Payer: BC Managed Care – PPO

## 2012-11-28 ENCOUNTER — Encounter: Payer: Self-pay | Admitting: Internal Medicine

## 2012-11-28 ENCOUNTER — Ambulatory Visit (INDEPENDENT_AMBULATORY_CARE_PROVIDER_SITE_OTHER): Payer: BC Managed Care – PPO | Admitting: Internal Medicine

## 2012-11-28 VITALS — BP 130/84 | HR 63 | Ht 70.0 in | Wt 227.0 lb

## 2012-11-28 DIAGNOSIS — I4891 Unspecified atrial fibrillation: Secondary | ICD-10-CM

## 2012-11-28 DIAGNOSIS — I2510 Atherosclerotic heart disease of native coronary artery without angina pectoris: Secondary | ICD-10-CM

## 2012-11-28 HISTORY — DX: Unspecified atrial fibrillation: I48.91

## 2012-11-28 MED ORDER — SPIRONOLACTONE 25 MG PO TABS: ORAL_TABLET | ORAL | Status: DC

## 2012-11-28 MED ORDER — CARVEDILOL 25 MG PO TABS: 25.0000 mg | ORAL_TABLET | Freq: Two times a day (BID) | ORAL | Status: DC

## 2012-11-28 MED ORDER — LOSARTAN POTASSIUM 25 MG PO TABS: 25.0000 mg | ORAL_TABLET | Freq: Every day | ORAL | Status: DC

## 2012-11-28 NOTE — Assessment & Plan Note (Signed)
The patient has ischemic and nonischemic myopathy status post recent revascularization. Based on that we will change his Lopressor to carvedilol. He has a cough and so we'll change his ACE inhibitor to losartan 25 and will add Aldactone therapy. I have reviewed with him the importance of potassium monitoring. We'll arrange for him to followup with [facility] in 4 weeks for reassessment and repeat metabolic profile  Last potassium earlier this week was 4.2

## 2012-11-28 NOTE — Progress Notes (Signed)
Patient Care Team: Rosalita Chessman, DO as PCP - General (Family Medicine) Burtis Junes, NP as Nurse Practitioner (Nurse Practitioner)   HPI  Vincent Chandler is a 52 y.o. male Seen in followup for ischemic heart disease and sinus tachycardia. When he was seen last new Q waves were identified. There is a concern about an intercurrent myocardial infarction  He was admitted for Myoview scanning which demonstrated a new inferolateral infarct likely associated with occlusion of his RCA stent. There was no ischemia  Echo 2012 The estimated ejection fraction was in the range of 25% to 30%. Inferior posterior akinesis- Left atrium: The atrium was mildly dilated.   he underwent catheterization 2/14 .demonstrating severe 3 vessel disease with occlusion of the RCA stent. He underwent bypass surgeryx4. This was further complicated by atrial fibrillation with a rapid ventricular response for which he is treated with amiodarone.   Repeat echocardiogram 3 months of surgery demonstrated improved left ventricular function at 30-35% severe left atrial enlargement-49 mm; there is no mitral regurgitation  He is doing very well. He is getting his strength back. Snores but uses his CPAP.  Past Medical History  Diagnosis Date  . Coronary artery disease     drug eluting stent RCA 2005-EF 30% at that time - s/p CABG x 4  . Cardiomyopathy     alcohol use related  . Asthma   . Hypertension   . Urinary incontinence   . Hyperlipidemia   . History of hypogonadism   . Pneumonia 02/16/2012    "today makes third time in my life"  . OSA on CPAP 02/16/2012    "wear mask sometimes"  . H/O hiatal hernia   . GERD (gastroesophageal reflux disease)   . Migraines 02/16/2012    "used to"  . Anxiety   . Diabetes mellitus type II     Type 2 NIDDM x 7 years  . History of kidney stones 06/2012    Past Surgical History  Procedure Laterality Date  . Tonsillectomy  ~ 1970  . Coronary angioplasty with stent placement  ~  2003  . Refractive surgery  1990's    bilaterally  . Coronary artery bypass graft N/A 08/15/2012    Procedure: CORONARY ARTERY BYPASS GRAFTING (CABG);  Surgeon: Ivin Poot, MD;  Location: [place];  Service: Open Heart Surgery;  Laterality: N/A;  Coronary Artery Bypass Grafting Times Four Using Left Internal Mammary Artery and Right Saphenous Leg Vein Harvested Endoscopically    Current Outpatient Prescriptions  Medication Sig Dispense Refill  . amiodarone (PACERONE) 200 MG tablet Take 1 tablet (200 mg total) by mouth daily.  30 tablet  11  . aspirin EC 325 MG tablet Take 1 tablet (325 mg total) by mouth daily.  30 tablet  0  . budesonide-formoterol (SYMBICORT) 160-4.5 MCG/ACT inhaler Inhale 2 puffs into the lungs 2 (two) times daily.  1 Inhaler  3  . HYDROcodone-acetaminophen (NORCO) 7.5-325 MG per tablet Take 1 tablet by mouth as needed for pain.      Marland Kitchen lisinopril (PRINIVIL,ZESTRIL) 10 MG tablet Take 1 tablet (10 mg total) by mouth daily.  90 tablet  3  . metFORMIN (GLUCOPHAGE-XR) 500 MG 24 hr tablet TAKE TWO TABLETS BY MOUTH DAILY WITH BREAKFAST  60 tablet  2  . metoprolol (LOPRESSOR) 100 MG tablet Take 1 tablet (100 mg total) by mouth 2 (two) times daily.  180 tablet  3  . pravastatin (PRAVACHOL) 40 MG tablet Take 1 tablet (40 mg total)  by mouth daily.  90 tablet  3   No current facility-administered medications for this visit.    No Known Allergies  Review of Systems negative except from HPI and PMH  Physical Exam Ht _0  (1.778 m)  Wt 227 lb (102.967 kg)  BMI 32.57 kg/m2 BP 130/84  Pulse 63  Ht _1  (1.778 m)  Wt 227 lb (102.967 kg)  BMI 32.57 kg/m2  Well developed and well nourished in no acute distress HENT normal E scleral and icterus clear Neck Supple JVP flat; carotids brisk and full Clear to ausculation  Regular rate and rhythm, no murmurs gallops or rub Soft with active bowel sounds No clubbing cyanosis none Edema Alert and oriented, grossly normal motor  and sensory function Skin Warm and Dry  ECG demonstrates sinus rhythm at 63 Exline intervals 20/10/46 Axis leftward at -60 Poor R-wave progression  Old IMI Possible AMI  Assessment and  Plan

## 2012-11-28 NOTE — Assessment & Plan Note (Signed)
We'll stop his amiodarone. We'll also decrease his aspirin from 325--81

## 2012-11-28 NOTE — Patient Instructions (Signed)
Your physician has recommended you make the following change in your medication:  1) Stop amiodarone 2) Decrease aspirin to 81 mg one tablet daily. 3) Stop lisinopril 4) Start losartan (cozaar) 25 mg one tablet daily 5) Stop metoprolol  6) Start carvedilol (coreg) 7) Start aldactone 25 mg 1/2 tablet by mouth once daily  Your physician recommends that you schedule a follow-up appointment in: 4 weeks with Truitt Merle, NP  Your physician wants you to follow-up in: 6 months with Dr. Caryl Comes. You will receive a reminder letter in the mail two months in advance. If you don't receive a letter, please call our office to schedule the follow-up appointment.

## 2012-11-28 NOTE — Assessment & Plan Note (Signed)
Blood pressure is adequate control. We'll make adjustments noted above.

## 2012-11-29 ENCOUNTER — Encounter (HOSPITAL_COMMUNITY): Payer: BC Managed Care – PPO

## 2012-12-02 ENCOUNTER — Encounter (HOSPITAL_COMMUNITY): Payer: BC Managed Care – PPO

## 2012-12-03 ENCOUNTER — Telehealth (HOSPITAL_COMMUNITY): Payer: Self-pay | Admitting: *Deleted

## 2012-12-04 ENCOUNTER — Encounter (HOSPITAL_COMMUNITY): Admission: RE | Admit: 2012-12-04 | Payer: BC Managed Care – PPO | Source: Ambulatory Visit

## 2012-12-06 ENCOUNTER — Encounter (HOSPITAL_COMMUNITY): Payer: BC Managed Care – PPO

## 2012-12-09 ENCOUNTER — Encounter (HOSPITAL_COMMUNITY): Payer: BC Managed Care – PPO

## 2012-12-11 ENCOUNTER — Encounter (HOSPITAL_COMMUNITY): Payer: BC Managed Care – PPO

## 2012-12-13 ENCOUNTER — Encounter (HOSPITAL_COMMUNITY): Payer: BC Managed Care – PPO

## 2012-12-16 ENCOUNTER — Encounter (HOSPITAL_COMMUNITY): Payer: BC Managed Care – PPO

## 2012-12-18 ENCOUNTER — Encounter (HOSPITAL_COMMUNITY): Payer: BC Managed Care – PPO

## 2012-12-20 ENCOUNTER — Encounter (HOSPITAL_COMMUNITY): Payer: BC Managed Care – PPO

## 2012-12-22 ENCOUNTER — Encounter (HOSPITAL_BASED_OUTPATIENT_CLINIC_OR_DEPARTMENT_OTHER): Payer: Self-pay

## 2012-12-22 ENCOUNTER — Emergency Department (HOSPITAL_BASED_OUTPATIENT_CLINIC_OR_DEPARTMENT_OTHER)
Admission: EM | Admit: 2012-12-22 | Discharge: 2012-12-22 | Disposition: A | Discharge status: Home / Self Care | Payer: BC Managed Care – PPO | Attending: Emergency Medicine | Admitting: Emergency Medicine

## 2012-12-22 DIAGNOSIS — Z8679 Personal history of other diseases of the circulatory system: Secondary | ICD-10-CM | POA: Insufficient documentation

## 2012-12-22 DIAGNOSIS — I2510 Atherosclerotic heart disease of native coronary artery without angina pectoris: Secondary | ICD-10-CM | POA: Insufficient documentation

## 2012-12-22 DIAGNOSIS — I10 Essential (primary) hypertension: Secondary | ICD-10-CM | POA: Insufficient documentation

## 2012-12-22 DIAGNOSIS — G4733 Obstructive sleep apnea (adult) (pediatric): Secondary | ICD-10-CM | POA: Insufficient documentation

## 2012-12-22 DIAGNOSIS — I4891 Unspecified atrial fibrillation: Secondary | ICD-10-CM | POA: Insufficient documentation

## 2012-12-22 DIAGNOSIS — Z8701 Personal history of pneumonia (recurrent): Secondary | ICD-10-CM | POA: Insufficient documentation

## 2012-12-22 DIAGNOSIS — Z87442 Personal history of urinary calculi: Secondary | ICD-10-CM | POA: Insufficient documentation

## 2012-12-22 DIAGNOSIS — L089 Local infection of the skin and subcutaneous tissue, unspecified: Secondary | ICD-10-CM | POA: Insufficient documentation

## 2012-12-22 DIAGNOSIS — Y929 Unspecified place or not applicable: Secondary | ICD-10-CM | POA: Insufficient documentation

## 2012-12-22 DIAGNOSIS — Z8639 Personal history of other endocrine, nutritional and metabolic disease: Secondary | ICD-10-CM | POA: Insufficient documentation

## 2012-12-22 DIAGNOSIS — J45909 Unspecified asthma, uncomplicated: Secondary | ICD-10-CM | POA: Insufficient documentation

## 2012-12-22 DIAGNOSIS — Z8659 Personal history of other mental and behavioral disorders: Secondary | ICD-10-CM | POA: Insufficient documentation

## 2012-12-22 DIAGNOSIS — Z8719 Personal history of other diseases of the digestive system: Secondary | ICD-10-CM | POA: Insufficient documentation

## 2012-12-22 DIAGNOSIS — Y9389 Activity, other specified: Secondary | ICD-10-CM | POA: Insufficient documentation

## 2012-12-22 DIAGNOSIS — Z79899 Other long term (current) drug therapy: Secondary | ICD-10-CM | POA: Insufficient documentation

## 2012-12-22 DIAGNOSIS — Z951 Presence of aortocoronary bypass graft: Secondary | ICD-10-CM | POA: Insufficient documentation

## 2012-12-22 DIAGNOSIS — E119 Type 2 diabetes mellitus without complications: Secondary | ICD-10-CM | POA: Insufficient documentation

## 2012-12-22 DIAGNOSIS — Z7982 Long term (current) use of aspirin: Secondary | ICD-10-CM | POA: Insufficient documentation

## 2012-12-22 DIAGNOSIS — W57XXXA Bitten or stung by nonvenomous insect and other nonvenomous arthropods, initial encounter: Secondary | ICD-10-CM | POA: Insufficient documentation

## 2012-12-22 DIAGNOSIS — Z862 Personal history of diseases of the blood and blood-forming organs and certain disorders involving the immune mechanism: Secondary | ICD-10-CM | POA: Insufficient documentation

## 2012-12-22 DIAGNOSIS — Z9861 Coronary angioplasty status: Secondary | ICD-10-CM | POA: Insufficient documentation

## 2012-12-22 MED ORDER — SULFAMETHOXAZOLE-TRIMETHOPRIM 800-160 MG PO TABS: 1.0000 | ORAL_TABLET | Freq: Two times a day (BID) | ORAL | Status: DC

## 2012-12-22 NOTE — ED Provider Notes (Signed)
History     CSN: [id number]  Arrival date & time 12/22/12  1235   First MD Initiated Contact with Patient 12/22/12 1325      Chief Complaint  Patient presents with  . Insect Bite    (Consider location/radiation/quality/duration/timing/severity/associated sxs/prior treatment) Patient is a 52 y.o. male presenting with leg pain. The history is provided by the patient. No language interpreter was used.  Leg Pain Location:  Leg Leg location:  L leg Pain details:    Radiates to:  Does not radiate   Severity:  Mild Pt reports he has a bite on his leg from Wednesday.  Pt reports area is red now.  Pt worried that he could have infection  Past Medical History  Diagnosis Date  . Coronary artery disease     drug eluting stent RCA 2005-EF 30% at that time - s/p CABG x 4  . Cardiomyopathy     alcohol use related  . Asthma   . Hypertension   . Urinary incontinence   . Hyperlipidemia   . History of hypogonadism   . Pneumonia 02/16/2012    "today makes third time in my life"  . OSA on CPAP 02/16/2012    "wear mask sometimes"  . H/O hiatal hernia   . GERD (gastroesophageal reflux disease)   . Migraines 02/16/2012    "used to"  . Anxiety   . Diabetes mellitus type II     Type 2 NIDDM x 7 years  . History of kidney stones 06/2012  . Atrial fibrillation-postoperative 11/28/2012    Past Surgical History  Procedure Laterality Date  . Tonsillectomy  ~ 1970  . Coronary angioplasty with stent placement  ~ 2003  . Refractive surgery  1990's    bilaterally  . Coronary artery bypass graft N/A 08/15/2012    Procedure: CORONARY ARTERY BYPASS GRAFTING (CABG);  Surgeon: Ivin Poot, MD;  Location: [place];  Service: Open Heart Surgery;  Laterality: N/A;  Coronary Artery Bypass Grafting Times Four Using Left Internal Mammary Artery and Right Saphenous Leg Vein Harvested Endoscopically    Family History  Problem Relation Age of Onset  . Hypertension Mother   . Diabetes Mother   .  Hypertension Father   . Diabetes Father   . Hyperlipidemia Father     History  Substance Use Topics  . Smoking status: Never Smoker   . Smokeless tobacco: Current User    Types: Chew     Comment: 02/16/2012 smoking cessation materials offered; pt declines  . Alcohol Use: Yes     Comment: socially occasional      Review of Systems  All other systems reviewed and are negative.    Allergies  Review of patient's allergies indicates no known allergies.  Home Medications   Current Outpatient Rx  Name  Route  Sig  Dispense  Refill  . aspirin EC 81 MG tablet   Oral   Take 1 tablet (81 mg total) by mouth daily.         . budesonide-formoterol (SYMBICORT) 160-4.5 MCG/ACT inhaler   Inhalation   Inhale 2 puffs into the lungs 2 (two) times daily.   1 Inhaler   3   . carvedilol (COREG) 25 MG tablet   Oral   Take 1 tablet (25 mg total) by mouth 2 (two) times daily.   180 tablet   3   . losartan (COZAAR) 25 MG tablet   Oral   Take 1 tablet (25 mg total) by mouth daily.  90 tablet   3   . metFORMIN (GLUCOPHAGE-XR) 500 MG 24 hr tablet      TAKE TWO TABLETS BY MOUTH DAILY WITH BREAKFAST   60 tablet   2   . pravastatin (PRAVACHOL) 40 MG tablet   Oral   Take 1 tablet (40 mg total) by mouth daily.   90 tablet   3   . HYDROcodone-acetaminophen (NORCO) 7.5-325 MG per tablet   Oral   Take 1 tablet by mouth as needed for pain.         Marland Kitchen spironolactone (ALDACTONE) 25 MG tablet      Take 1/2 tablet by mouth once daily   45 tablet   3     BP 122/75  Pulse 82  Temp(Src) 98 F (36.7 C) (Oral)  Resp 16  Ht _0  (1.753 m)  Wt 223 lb (101.152 kg)  BMI 32.92 kg/m2  SpO2 96%  Physical Exam  Nursing note and vitals reviewed. Constitutional: He is oriented to person, place, and time. He appears well-developed.  Musculoskeletal: He exhibits tenderness.  1cm round abrasion erythematous area  Neurological: He is alert and oriented to person, place, and time.   Skin: There is erythema.  Psychiatric: He has a normal mood and affect.    ED Course  Procedures (including critical care time)  Labs Reviewed - No data to display No results found.   1. Skin infection       MDM  Bactrim ds [place], [place] 12/22/12 [place], [place] 12/22/12 1400

## 2012-12-22 NOTE — ED Notes (Signed)
Pt states that he noticed an insect bite just proximal to the L lateral ankle on his leg and states that he is concerned he may have been bitten by a spider instead of some other kind of insect, such as mosquito.  Pt states that he recently had open heart surgery 4 months ago.  Redness, minor swelling noted to the area.

## 2012-12-22 NOTE — ED Provider Notes (Signed)
Medical screening examination/treatment/procedure(s) were performed by non-physician practitioner and as supervising physician I was immediately available for consultation/collaboration.   Saddie Benders. Kofi Murrell, MD 12/22/12 1513

## 2012-12-27 ENCOUNTER — Other Ambulatory Visit: Payer: Self-pay | Admitting: *Deleted

## 2012-12-27 ENCOUNTER — Ambulatory Visit (INDEPENDENT_AMBULATORY_CARE_PROVIDER_SITE_OTHER): Payer: BC Managed Care – PPO | Admitting: Nurse Practitioner

## 2012-12-27 ENCOUNTER — Encounter: Payer: Self-pay | Admitting: Nurse Practitioner

## 2012-12-27 VITALS — BP 122/78 | HR 84 | Ht 69.0 in | Wt 228.1 lb

## 2012-12-27 DIAGNOSIS — E876 Hypokalemia: Secondary | ICD-10-CM

## 2012-12-27 DIAGNOSIS — I5022 Chronic systolic (congestive) heart failure: Secondary | ICD-10-CM

## 2012-12-27 LAB — BASIC METABOLIC PANEL
BUN: 22 mg/dL (ref 6–23)
CO2: 21 mEq/L (ref 19–32)
Calcium: 9.3 mg/dL (ref 8.4–10.5)
Chloride: 100 mEq/L (ref 96–112)
Creatinine, Ser: 1.5 mg/dL (ref 0.4–1.5)
GFR: 54.44 mL/min — ABNORMAL LOW (ref >60.00)
Glucose, Bld: 262 mg/dL — ABNORMAL HIGH (ref 70–99)
Potassium: 5.2 mEq/L — ABNORMAL HIGH (ref 3.5–5.1)
Sodium: 132 mEq/L — ABNORMAL LOW (ref 135–145)

## 2012-12-27 MED ORDER — CARVEDILOL 25 MG PO TABS: 25.0000 mg | ORAL_TABLET | Freq: Two times a day (BID) | ORAL | Status: DC

## 2012-12-27 NOTE — Progress Notes (Addendum)
Ray Church Aguiniga Date of Birth: [birth year] Medical Record #[id number]  History of Present Illness: Vincent Chandler is seen back today for a one month check. Seen for Dr. Caryl Comes. He has an ischemic CM. EF down to 35% in 2012 and abnormal Myoview in 2012 as well. Then lost to follow up and got off track with his health care. His other issues include HTN, DM, obesity, OSA and HLD.   Has had repeat cath this year showing EF of 30%. Referred for cardiac cath and then went on to have CABG. Had post op AF and was on amiodarone. We have been titrating back up his medicines. He has had follow up echo 3 months post CABG - EF at 30 to 35%. No MR noted.   Saw Dr. Caryl Comes last month - changed to Coreg from metoprolol. Amiodarone was stopped. ACE was changed to ARB due to cough. Aldactone was added as well. No talk of ICD due to improvement of his EF.  He comes back today. He is here alone. He is doing ok. Cough is gone. He is back to work. Walking 30 minutes a day. Only taking the Coreg once a day - did not understand to take BID. Not short of breath. No swelling. Has noticed that his strength in his upper body has decreased since his surgery. He notes this with climbing ladders and trying to get back in a boat when he was at the [employer] last week. Not dizzy or lightheaded. Has returned to work. Had to quit cardiac rehab due to going back to work.    Current Outpatient Prescriptions  Medication Sig Dispense Refill  . aspirin EC 81 MG tablet Take 1 tablet (81 mg total) by mouth daily.      . budesonide-formoterol (SYMBICORT) 160-4.5 MCG/ACT inhaler Inhale 2 puffs into the lungs 2 (two) times daily.  1 Inhaler  3  . carvedilol (COREG) 25 MG tablet Take 25 mg by mouth daily.      Marland Kitchen losartan (COZAAR) 25 MG tablet Take 1 tablet (25 mg total) by mouth daily.  90 tablet  3  . metFORMIN (GLUCOPHAGE-XR) 500 MG 24 hr tablet TAKE TWO TABLETS BY MOUTH DAILY WITH BREAKFAST  60 tablet  2  . pravastatin (PRAVACHOL) 40 MG tablet Take 1  tablet (40 mg total) by mouth daily.  90 tablet  3  . spironolactone (ALDACTONE) 25 MG tablet Take 1/2 tablet by mouth once daily  45 tablet  3  . sulfamethoxazole-trimethoprim (SEPTRA DS) 800-160 MG per tablet Take 1 tablet by mouth every 12 (twelve) hours.  14 tablet  0   No current facility-administered medications for this visit.    No Known Allergies  Past Medical History  Diagnosis Date  . Coronary artery disease     drug eluting stent RCA 2005-EF 30% at that time - s/p CABG x 4  . Cardiomyopathy     alcohol use related  . Asthma   . Hypertension   . Urinary incontinence   . Hyperlipidemia   . History of hypogonadism   . Pneumonia 02/16/2012    "today makes third time in my life"  . OSA on CPAP 02/16/2012    "wear mask sometimes"  . H/O hiatal hernia   . GERD (gastroesophageal reflux disease)   . Migraines 02/16/2012    "used to"  . Anxiety   . Diabetes mellitus type II     Type 2 NIDDM x 7 years  . History of kidney stones 06/2012  .  Atrial fibrillation-postoperative 11/28/2012    Past Surgical History  Procedure Laterality Date  . Tonsillectomy  ~ 1970  . Coronary angioplasty with stent placement  ~ 2003  . Refractive surgery  1990's    bilaterally  . Coronary artery bypass graft N/A 08/15/2012    Procedure: CORONARY ARTERY BYPASS GRAFTING (CABG);  Surgeon: Ivin Poot, MD;  Location: [place];  Service: Open Heart Surgery;  Laterality: N/A;  Coronary Artery Bypass Grafting Times Four Using Left Internal Mammary Artery and Right Saphenous Leg Vein Harvested Endoscopically    History  Smoking status  . Never Smoker   Smokeless tobacco  . Current User  . Types: Chew    Comment: 02/16/2012 smoking cessation materials offered; pt declines    History  Alcohol Use  . Yes    Comment: socially occasional    Family History  Problem Relation Age of Onset  . Hypertension Mother   . Diabetes Mother   . Hypertension Father   . Diabetes Father   . Hyperlipidemia  Father     Review of Systems: The review of systems is per the HPI.  All other systems were reviewed and are negative.  Physical Exam: BP 122/78  Pulse 84  Ht _0  (1.753 m)  Wt 228 lb 1.9 oz (103.475 kg)  BMI 33.67 kg/m2 Patient is very pleasant and in no acute distress. He is overweight. Skin is warm and dry. Color is normal.  HEENT is unremarkable. Normocephalic/atraumatic. PERRL. Sclera are nonicteric. Neck is supple. No masses. No JVD. Lungs are clear. Cardiac exam shows a regular rate and rhythm. No S3 noted. Abdomen is soft. Extremities are without edema. Gait and ROM are intact. No gross neurologic deficits noted.  LABORATORY DATA: BMET is pending  Lab Results  Component Value Date   WBC 7.0 09/04/2012   HGB 12.2* 09/04/2012   HCT 37.1* 09/04/2012   PLT 447.0* 09/04/2012   GLUCOSE 152* 11/26/2012   CHOL 167 11/26/2012   TRIG 133.0 11/26/2012   HDL 40.40 11/26/2012   LDLDIRECT 153.1 11/03/2010   LDLCALC 100* 11/26/2012   ALT 19 11/26/2012   AST 18 11/26/2012   NA 141 11/26/2012   K 4.2 11/26/2012   CL 105 11/26/2012   CREATININE 1.1 11/26/2012   BUN 19 11/26/2012   CO2 28 11/26/2012   TSH 2.025 08/17/2012   PSA 0.24 03/10/2010   INR 1.32 08/15/2012   HGBA1C 8.2* 08/13/2012   MICROALBUR 5.0* 03/10/2010   Echo Study Conclusions  - Left ventricle: LVEF is severely depressed at approximately 30 to 35% with diffuse hypokinesis worse in the inferior, inferoseptal and lateral walls. Wall thickness was increased in a pattern of moderate LVH. - Left atrium: The atrium was moderately to severely dilated.   Assessment / Plan: 1. Mixed ischemic/nonischemic CM - EF 30 to 35% - will increase the Coreg to BID. Check BMET today. I have left him on his other medicines for now but would hope to titrate further. Will ask Dr. Caryl Comes for his recommendation on repeat echo timing.   2. HTN - controlled  3. HLD - on statin therapy - lipids checked in May  4. Obesity - encouraged him to increase his  walking to 45 minutes per day.   I will see him in a month. Overall, he is doing well.   Patient is agreeable to this plan and will call if any problems develop in the interim.   Burtis Junes, RN, ANP-C [place] [phone]  [address] [place], [place]  [address]  Addendum from Dr. Caryl Comes in regards to timing of repeat echo:  About 3 months

## 2012-12-27 NOTE — Patient Instructions (Addendum)
Stay on your current medicines but take the Coreg two times a day  Watch your salt  Increase exercise to 45 minutes a day  I will see you in a month  We will check lab today  See medical records today  Call the [place] office at [phone] if you have any questions, problems or concerns.

## 2012-12-30 ENCOUNTER — Other Ambulatory Visit (INDEPENDENT_AMBULATORY_CARE_PROVIDER_SITE_OTHER): Payer: BC Managed Care – PPO

## 2012-12-30 DIAGNOSIS — E876 Hypokalemia: Secondary | ICD-10-CM

## 2012-12-30 LAB — BASIC METABOLIC PANEL
BUN: 26 mg/dL — ABNORMAL HIGH (ref 6–23)
CO2: 22 mEq/L (ref 19–32)
Calcium: 9.4 mg/dL (ref 8.4–10.5)
Chloride: 102 mEq/L (ref 96–112)
Creatinine, Ser: 1.5 mg/dL (ref 0.4–1.5)
GFR: 53.17 mL/min — ABNORMAL LOW (ref >60.00)
Glucose, Bld: 199 mg/dL — ABNORMAL HIGH (ref 70–99)
Potassium: 5 mEq/L (ref 3.5–5.1)
Sodium: 135 mEq/L (ref 135–145)

## 2013-01-06 ENCOUNTER — Telehealth: Payer: Self-pay | Admitting: Internal Medicine

## 2013-01-06 NOTE — Telephone Encounter (Signed)
Pt Signed ROI, All Cardiac records mailed to Pt Home Address 01/06/13/KM

## 2013-01-09 ENCOUNTER — Other Ambulatory Visit: Payer: Self-pay | Admitting: Family Medicine

## 2013-01-24 ENCOUNTER — Ambulatory Visit: Payer: BC Managed Care – PPO | Admitting: Nurse Practitioner

## 2013-02-09 ENCOUNTER — Other Ambulatory Visit: Payer: Self-pay | Admitting: Family Medicine

## 2013-02-17 ENCOUNTER — Ambulatory Visit: Payer: BC Managed Care – PPO | Admitting: Nurse Practitioner

## 2013-02-26 ENCOUNTER — Other Ambulatory Visit: Payer: Self-pay | Admitting: Family Medicine

## 2013-03-12 ENCOUNTER — Encounter: Payer: Self-pay | Admitting: Nurse Practitioner

## 2013-03-12 ENCOUNTER — Ambulatory Visit (INDEPENDENT_AMBULATORY_CARE_PROVIDER_SITE_OTHER): Payer: BC Managed Care – PPO | Admitting: Nurse Practitioner

## 2013-03-12 VITALS — BP 150/100 | HR 80 | Ht 69.0 in | Wt 238.8 lb

## 2013-03-12 DIAGNOSIS — I255 Ischemic cardiomyopathy: Secondary | ICD-10-CM

## 2013-03-12 DIAGNOSIS — I2589 Other forms of chronic ischemic heart disease: Secondary | ICD-10-CM

## 2013-03-12 LAB — BASIC METABOLIC PANEL
BUN: 18 mg/dL (ref 6–23)
CO2: 23 mEq/L (ref 19–32)
Calcium: 9.4 mg/dL (ref 8.4–10.5)
Chloride: 101 mEq/L (ref 96–112)
Creatinine, Ser: 1.1 mg/dL (ref 0.4–1.5)
GFR: 72.54 mL/min (ref >60.00)
Glucose, Bld: 229 mg/dL — ABNORMAL HIGH (ref 70–99)
Potassium: 4.2 mEq/L (ref 3.5–5.1)
Sodium: 133 mEq/L — ABNORMAL LOW (ref 135–145)

## 2013-03-12 NOTE — Progress Notes (Signed)
Vincent Chandler Date of Birth: 1960-08-15 Medical Record #[id number]  History of Present Illness: Vincent Chandler is seen back today for what was to be a one month check but is more like 2 1/2 month check. Seen for Dr. Caryl Comes. He has an ischemic CM. EF down to 35% in 2012 and abnormal Myoview in 2012 as well. Then lost to follow up and got off track with his health care. His other issues include HTN, DM, obesity, OSA and HLD.   Has had repeat cath earlier year showing EF of 30%. Referred for cardiac cath and then went on to have CABG. Had post op AF and was on amiodarone. We have been titrating back up his medicines. He has had follow up echo 3 months post CABG - EF at 30 to 35%. No MR noted.   Saw Dr. Caryl Comes in May of 2014 - changed to Coreg from metoprolol. Amiodarone was stopped. ACE was changed to ARB due to cough. Aldactone was added as well. No talk of ICD due to some improvement of his EF but remains depressed at 30 to 35%.   I saw him 2 1/2 months ago - weight was climbing. Not exercising. He was not taking his Coreg right and we corrected this.   Comes back today. Here alone. Weight is really climbing. Eating lots of junk food due to work traveling. Not exercising. Did not take any of his medicines today due to having to go have a drug test and thought it might interfere. Feels pretty good. Seems motivated to make changes. No chest pain. Not short of breath. No swelling. Last potassium was 5.0.  Current Outpatient Prescriptions  Medication Sig Dispense Refill  . aspirin EC 81 MG tablet Take 1 tablet (81 mg total) by mouth daily.      . budesonide-formoterol (SYMBICORT) 160-4.5 MCG/ACT inhaler Inhale 2 puffs into the lungs 2 (two) times daily.  1 Inhaler  3  . carvedilol (COREG) 25 MG tablet Take 1 tablet (25 mg total) by mouth 2 (two) times daily with a meal.      . losartan (COZAAR) 25 MG tablet Take 1 tablet (25 mg total) by mouth daily.  90 tablet  3  . metFORMIN (GLUCOPHAGE-XR) 500 MG 24 hr  tablet take 2 tablets daily with breakfast, **Labs are due now**  30 tablet  0  . pravastatin (PRAVACHOL) 40 MG tablet Take 1 tablet (40 mg total) by mouth daily.  90 tablet  3  . spironolactone (ALDACTONE) 25 MG tablet Take 1/2 tablet by mouth once daily  45 tablet  3   No current facility-administered medications for this visit.    No Known Allergies  Past Medical History  Diagnosis Date  . Coronary artery disease     drug eluting stent RCA 2005-EF 30% at that time - s/p CABG x 4  . Cardiomyopathy     alcohol use related  . Asthma   . Hypertension   . Urinary incontinence   . Hyperlipidemia   . History of hypogonadism   . Pneumonia 02/16/2012    "today makes third time in my life"  . OSA on CPAP 02/16/2012    "wear mask sometimes"  . H/O hiatal hernia   . GERD (gastroesophageal reflux disease)   . Migraines 02/16/2012    "used to"  . Anxiety   . Diabetes mellitus type II     Type 2 NIDDM x 7 years  . History of kidney stones 06/2012  . Atrial  fibrillation-postoperative 11/28/2012    Past Surgical History  Procedure Laterality Date  . Tonsillectomy  ~ 1970  . Coronary angioplasty with stent placement  ~ 2003  . Refractive surgery  1990's    bilaterally  . Coronary artery bypass graft N/A 08/15/2012    Procedure: CORONARY ARTERY BYPASS GRAFTING (CABG);  Surgeon: Ivin Poot, MD;  Location: [facility];  Service: Open Heart Surgery;  Laterality: N/A;  Coronary Artery Bypass Grafting Times Four Using Left Internal Mammary Artery and Right Saphenous Leg Vein Harvested Endoscopically    History  Smoking status  . Never Smoker   Smokeless tobacco  . Current User  . Types: Chew    Comment: 02/16/2012 smoking cessation materials offered; pt declines    History  Alcohol Use  . Yes    Comment: socially occasional    Family History  Problem Relation Age of Onset  . Hypertension Mother   . Diabetes Mother   . Hypertension Father   . Diabetes Father   . Hyperlipidemia  Father     Review of Systems: The review of systems is per the HPI.  All other systems were reviewed and are negative.  Physical Exam: BP 150/100  Pulse 80  Ht _0  (1.753 m)  Wt 238 lb 12.8 oz (108.319 kg)  BMI 35.25 kg/m2 Patient is very pleasant and in no acute distress. He is obese. Skin is warm and dry. Color is normal.  HEENT is unremarkable. Normocephalic/atraumatic. PERRL. Sclera are nonicteric. Neck is supple. No masses. No JVD. Lungs are clear. Cardiac exam shows a regular rate and rhythm. Abdomen is soft. Extremities are without edema. Gait and ROM are intact. No gross neurologic deficits noted.  LABORATORY DATA: BMET is pending  Lab Results  Component Value Date   WBC 7.0 09/04/2012   HGB 12.2* 09/04/2012   HCT 37.1* 09/04/2012   PLT 447.0* 09/04/2012   GLUCOSE 199* 12/30/2012   CHOL 167 11/26/2012   TRIG 133.0 11/26/2012   HDL 40.40 11/26/2012   LDLDIRECT 153.1 11/03/2010   LDLCALC 100* 11/26/2012   ALT 19 11/26/2012   AST 18 11/26/2012   NA 135 12/30/2012   K 5.0 12/30/2012   CL 102 12/30/2012   CREATININE 1.5 12/30/2012   BUN 26* 12/30/2012   CO2 22 12/30/2012   TSH 2.025 08/17/2012   PSA 0.24 03/10/2010   INR 1.32 08/15/2012   HGBA1C 8.2* 08/13/2012   MICROALBUR 5.0* 03/10/2010   Echo Study Conclusions from May 2014  - Left ventricle: LVEF is severely depressed at approximately 30 to 35% with diffuse hypokinesis worse in the inferior, inferoseptal and lateral walls. Wall thickness was increased in a pattern of moderate LVH. - Left atrium: The atrium was moderately to severely dilated.  Assessment / Plan: 1. Ischemic CM - EF of 30 to 35% - I am very hesitant to increase his medicine based on no recent labs and past potassium of 5.0. Will recheck his lab this morning and will then decide on whether we can increase the Losartan or the aldactone. Plan for repeat echo later this month.   2. HTN - no medicines yet today - not able to check at home.   3. HLD   4. Obesity -  discussed in detail - he knows what he needs to do to improve.   Patient is agreeable to this plan and will call if any problems develop in the interim.   Burtis Junes, RN, [facility]  [address] [place], [place]  [address]

## 2013-03-12 NOTE — Patient Instructions (Addendum)
Continue with your current medicines  Exercise!!!  We need to check lab today - if your potassium level is ok - I will call you and increase either Losartan or your Aldactone  We need to repeat an echo again - later this month  I will see you back in a month  Call the [place] office at [phone] if you have any questions, problems or concerns.

## 2013-03-14 ENCOUNTER — Other Ambulatory Visit: Payer: Self-pay | Admitting: Family Medicine

## 2013-03-14 ENCOUNTER — Other Ambulatory Visit: Payer: Self-pay | Admitting: *Deleted

## 2013-03-14 DIAGNOSIS — I5020 Unspecified systolic (congestive) heart failure: Secondary | ICD-10-CM

## 2013-03-14 DIAGNOSIS — I2510 Atherosclerotic heart disease of native coronary artery without angina pectoris: Secondary | ICD-10-CM

## 2013-03-14 MED ORDER — LOSARTAN POTASSIUM 25 MG PO TABS: 50.0000 mg | ORAL_TABLET | Freq: Every day | ORAL | Status: DC

## 2013-03-26 ENCOUNTER — Ambulatory Visit (HOSPITAL_COMMUNITY): Payer: BC Managed Care – PPO | Attending: Nurse Practitioner | Admitting: Radiology

## 2013-03-26 ENCOUNTER — Other Ambulatory Visit: Payer: Self-pay | Admitting: Family Medicine

## 2013-03-26 DIAGNOSIS — I509 Heart failure, unspecified: Secondary | ICD-10-CM | POA: Insufficient documentation

## 2013-03-26 DIAGNOSIS — I10 Essential (primary) hypertension: Secondary | ICD-10-CM | POA: Insufficient documentation

## 2013-03-26 DIAGNOSIS — E119 Type 2 diabetes mellitus without complications: Secondary | ICD-10-CM | POA: Insufficient documentation

## 2013-03-26 DIAGNOSIS — I379 Nonrheumatic pulmonary valve disorder, unspecified: Secondary | ICD-10-CM | POA: Insufficient documentation

## 2013-03-26 DIAGNOSIS — G4733 Obstructive sleep apnea (adult) (pediatric): Secondary | ICD-10-CM | POA: Insufficient documentation

## 2013-03-26 DIAGNOSIS — I2589 Other forms of chronic ischemic heart disease: Secondary | ICD-10-CM | POA: Insufficient documentation

## 2013-03-26 DIAGNOSIS — I25810 Atherosclerosis of coronary artery bypass graft(s) without angina pectoris: Secondary | ICD-10-CM | POA: Insufficient documentation

## 2013-03-26 DIAGNOSIS — R000 Tachycardia, unspecified: Secondary | ICD-10-CM | POA: Insufficient documentation

## 2013-03-26 DIAGNOSIS — I255 Ischemic cardiomyopathy: Secondary | ICD-10-CM

## 2013-03-26 DIAGNOSIS — E669 Obesity, unspecified: Secondary | ICD-10-CM | POA: Insufficient documentation

## 2013-03-26 DIAGNOSIS — I079 Rheumatic tricuspid valve disease, unspecified: Secondary | ICD-10-CM | POA: Insufficient documentation

## 2013-03-26 DIAGNOSIS — E785 Hyperlipidemia, unspecified: Secondary | ICD-10-CM | POA: Insufficient documentation

## 2013-03-26 DIAGNOSIS — I4891 Unspecified atrial fibrillation: Secondary | ICD-10-CM | POA: Insufficient documentation

## 2013-03-26 NOTE — Progress Notes (Signed)
Echocardiogram performed.  

## 2013-03-27 ENCOUNTER — Other Ambulatory Visit (HOSPITAL_COMMUNITY): Payer: BC Managed Care – PPO

## 2013-03-27 NOTE — Telephone Encounter (Signed)
Last seen 06/21/12 and labs done 06/05/12. No pending apts. Letter mailed to schedule an apt. Please advise      KP

## 2013-03-28 ENCOUNTER — Other Ambulatory Visit: Payer: BC Managed Care – PPO

## 2013-04-09 ENCOUNTER — Ambulatory Visit (INDEPENDENT_AMBULATORY_CARE_PROVIDER_SITE_OTHER): Payer: BC Managed Care – PPO | Admitting: Family Medicine

## 2013-04-09 ENCOUNTER — Encounter: Payer: Self-pay | Admitting: Family Medicine

## 2013-04-09 VITALS — BP 132/88 | HR 94 | Temp 98.2°F | Wt 241.8 lb

## 2013-04-09 DIAGNOSIS — E119 Type 2 diabetes mellitus without complications: Secondary | ICD-10-CM

## 2013-04-09 DIAGNOSIS — I4891 Unspecified atrial fibrillation: Secondary | ICD-10-CM

## 2013-04-09 DIAGNOSIS — I255 Ischemic cardiomyopathy: Secondary | ICD-10-CM

## 2013-04-09 DIAGNOSIS — E785 Hyperlipidemia, unspecified: Secondary | ICD-10-CM

## 2013-04-09 DIAGNOSIS — N179 Acute kidney failure, unspecified: Secondary | ICD-10-CM

## 2013-04-09 DIAGNOSIS — Z23 Encounter for immunization: Secondary | ICD-10-CM

## 2013-04-09 DIAGNOSIS — E1159 Type 2 diabetes mellitus with other circulatory complications: Secondary | ICD-10-CM

## 2013-04-09 DIAGNOSIS — I10 Essential (primary) hypertension: Secondary | ICD-10-CM

## 2013-04-09 DIAGNOSIS — Z0000 Encounter for general adult medical examination without abnormal findings: Secondary | ICD-10-CM

## 2013-04-09 DIAGNOSIS — I2589 Other forms of chronic ischemic heart disease: Secondary | ICD-10-CM

## 2013-04-09 LAB — BASIC METABOLIC PANEL WITH GFR
BUN: 25 mg/dL — ABNORMAL HIGH (ref 6–23)
CO2: 22 meq/L (ref 19–32)
Calcium: 9.9 mg/dL (ref 8.4–10.5)
Chloride: 104 meq/L (ref 96–112)
Creatinine, Ser: 1.2 mg/dL (ref 0.4–1.5)
GFR: 69.66 mL/min
Glucose, Bld: 264 mg/dL — ABNORMAL HIGH (ref 70–99)
Potassium: 4.7 meq/L (ref 3.5–5.1)
Sodium: 139 meq/L (ref 135–145)

## 2013-04-09 LAB — CBC WITH DIFFERENTIAL/PLATELET
Basophils Absolute: 0 10*3/uL (ref 0.0–0.1)
Basophils Relative: 0.4 % (ref 0.0–3.0)
Eosinophils Absolute: 0.4 10*3/uL (ref 0.0–0.7)
Eosinophils Relative: 7 % — ABNORMAL HIGH (ref 0.0–5.0)
HCT: 44 % (ref 39.0–52.0)
Hemoglobin: 15.2 g/dL (ref 13.0–17.0)
Lymphocytes Relative: 33.1 % (ref 12.0–46.0)
Lymphs Abs: 1.7 10*3/uL (ref 0.7–4.0)
MCHC: 34.5 g/dL (ref 30.0–36.0)
MCV: 103.2 fl — ABNORMAL HIGH (ref 78.0–100.0)
Monocytes Absolute: 0.6 10*3/uL (ref 0.1–1.0)
Monocytes Relative: 11 % (ref 3.0–12.0)
Neutro Abs: 2.6 10*3/uL (ref 1.4–7.7)
Neutrophils Relative %: 48.5 % (ref 43.0–77.0)
Platelets: 199 10*3/uL (ref 150.0–400.0)
RBC: 4.27 Mil/uL (ref 4.22–5.81)
RDW: 15.3 % — ABNORMAL HIGH (ref 11.5–14.6)
WBC: 5.3 10*3/uL (ref 4.5–10.5)

## 2013-04-09 LAB — HEPATIC FUNCTION PANEL
ALT: 42 U/L (ref 0–53)
AST: 32 U/L (ref 0–37)
Albumin: 4.3 g/dL (ref 3.5–5.2)
Alkaline Phosphatase: 50 U/L (ref 39–117)
Bilirubin, Direct: 0.1 mg/dL (ref 0.0–0.3)
Total Bilirubin: 0.7 mg/dL (ref 0.3–1.2)
Total Protein: 7.8 g/dL (ref 6.0–8.3)

## 2013-04-09 LAB — LIPID PANEL
Cholesterol: 184 mg/dL (ref 0–200)
HDL: 35.7 mg/dL — ABNORMAL LOW
Total CHOL/HDL Ratio: 5
Triglycerides: 288 mg/dL — ABNORMAL HIGH (ref 0.0–149.0)
VLDL: 57.6 mg/dL — ABNORMAL HIGH (ref 0.0–40.0)

## 2013-04-09 LAB — MICROALBUMIN / CREATININE URINE RATIO
Creatinine,U: 118.7 mg/dL
Microalb Creat Ratio: 2.9 mg/g (ref 0.0–30.0)

## 2013-04-09 LAB — POCT URINALYSIS DIPSTICK
Bilirubin, UA: NEGATIVE
Blood, UA: NEGATIVE
Nitrite, UA: NEGATIVE
Spec Grav, UA: 1.02
Urobilinogen, UA: 0.2
pH, UA: 5

## 2013-04-09 LAB — HEMOGLOBIN A1C: Hgb A1c MFr Bld: 8.7 % — ABNORMAL HIGH (ref 4.6–6.5)

## 2013-04-09 MED ORDER — GLUCOSE BLOOD VI STRP: ORAL_STRIP | Status: DC

## 2013-04-09 MED ORDER — FREESTYLE SYSTEM KIT: 1.0000 | PACK | Status: DC | PRN

## 2013-04-09 NOTE — Patient Instructions (Signed)
Diabetes and Standards of Medical Care  Diabetes is complicated. You may find that your diabetes team includes a dietitian, nurse, diabetes educator, eye doctor, and more. To help everyone know what is going on and to help you get the care you deserve, the following schedule of care was developed to help keep you on track. Below are the tests, exams, vaccines, medicines, education, and plans you will need. A1c test  Performed at least 2 times a year if you are meeting treatment goals.  Performed 4 times a year if therapy has changed or if you are not meeting treatment goals. Blood pressure test  Performed at every routine medical visit. The goal is less than 120/80 mmHg. Dental exam  Follow up with the dentist regularly. Eye exam  Diagnosed with type 1 diabetes as a child: Get an exam upon reaching the age of 64 years or older and having had diabetes for 3 5 years. Yearly eye exams are recommended after that initial eye exam.  Diagnosed with type 1 diabetes as an adult: Get an exam within 5 years of diagnosis and then yearly.  Diagnosed with type 2 diabetes: Get an exam as soon as possible after the diagnosis and then yearly. Foot care exam  Visual foot exams are performed at every routine medical visit. The exams check for cuts, injuries, or other problems with the feet.  A comprehensive foot exam should be done yearly. This includes visual inspection as well as assessing foot pulses and testing for loss of sensation. Kidney function test (urine microalbumin)  Performed once a year.  Type 1 diabetes: The first test is performed 5 years after diagnosis.  Type 2 diabetes: The first test is performed at the time of diagnosis.  A serum creatinine and estimated glomerular filtration rate (eGFR) test is done once a year to tell the level of chronic kidney disease (CKD), if present. Lipid profile (Cholesterol, HDL, LDL, Triglycerides)  Performed every 5 years for most people.  The  goal for LDL is less than 100 mg/dl. If at high risk, the goal is less than 70 mg/dl.  The goal for HDL is 40 mg/dl 50 mg/dl for men and 50 mg/dl 60 mg/dl for women. An HDL cholesterol of 60 mg/dL or higher gives some protection against heart disease.  The goal for triglycerides is less than 150 mg/dl. Influenza vaccine, pneumococcal vaccine, and hepatitis B vaccine  The influenza vaccine is recommended yearly.  The pneumococcal vaccine is generally given once in a lifetime. However, there are some instances when another vaccination is recommended. Check with your caregiver.  The hepatitis B vaccine is also recommended for adults with diabetes. Diabetes self-management education  Recommended at diagnosis and ongoing as needed. Treatment plan  Reviewed at every medical visit. Document Released: 04/16/2009 Document Revised: 06/05/2012 Document Reviewed: 12/20/2010 [facility] Patient Information 2014 [place], [place].

## 2013-04-09 NOTE — Progress Notes (Signed)
  Subjective:    Patient ID: Vincent Chandler, male    DOB: Oct 22, 1960, 52 y.o.   MRN: [id number]  HPI HYPERTENSION Disease Monitoring Blood pressure range-not checked  Chest pain- no      Dyspnea- no Medications Compliance- good Lightheadedness- no   Edema- no   DIABETES Disease Monitoring Blood Sugar ranges-not checking Polyuria- no New Visual problems- no Medications Compliance- good Hypoglycemic symptoms- no   HYPERLIPIDEMIA Disease Monitoring See symptoms for Hypertension Medications Compliance- good RUQ pain- no  Muscle aches- no  ROS See HPI above   PMH Smoking Status noted     Review of Systems As above    Objective:   Physical Exam        Assessment & Plan:

## 2013-04-11 ENCOUNTER — Encounter: Payer: Self-pay | Admitting: Nurse Practitioner

## 2013-04-11 ENCOUNTER — Ambulatory Visit (INDEPENDENT_AMBULATORY_CARE_PROVIDER_SITE_OTHER): Payer: BC Managed Care – PPO | Admitting: Nurse Practitioner

## 2013-04-11 VITALS — BP 110/80 | HR 72 | Ht 69.0 in | Wt 241.0 lb

## 2013-04-11 DIAGNOSIS — I255 Ischemic cardiomyopathy: Secondary | ICD-10-CM

## 2013-04-11 DIAGNOSIS — I2589 Other forms of chronic ischemic heart disease: Secondary | ICD-10-CM

## 2013-04-11 DIAGNOSIS — I2510 Atherosclerotic heart disease of native coronary artery without angina pectoris: Secondary | ICD-10-CM

## 2013-04-11 MED ORDER — SPIRONOLACTONE 25 MG PO TABS: ORAL_TABLET | ORAL | Status: DC

## 2013-04-11 NOTE — Progress Notes (Signed)
Vincent Chandler Date of Birth: Sep 30, 1960 Medical Record #[id number]  History of Present Illness: Vincent Chandler is seen back today for a one month check. Seen for Dr. Caryl Comes. He has an ischemic CM. EF down to 35% in 2012 and abnormal Myoview in 2012 as well. Then lost to follow up and got off track with his health care. His other issues include HTN, DM, obesity, OSA and HLD.   Has had repeat cath earlier year showing EF of 30%. Referred for cardiac cath and then went on to have CABG. Had post op AF and was on amiodarone. We have been titrating back up his medicines. He has had follow up echo 3 months post CABG - EF at 30 to 35%. No MR noted.   Saw Dr. Caryl Comes in May of 2014 - changed to Coreg from metoprolol. Amiodarone was stopped. ACE was changed to ARB due to cough. Aldactone was added as well. No talk of ICD due to some improvement of his EF but remains depressed at 30 to 35%.   Seen last month - rechecked his labs and we titrated his medicines again - increased Losartan. Repeat echo done as well. Dr. Caryl Comes has reviewed and his notes are noted.   Comes back today. Here alone. Doing ok. Not short of breath. No PND, orthopnea or swelling. Not dizzy or lightheaded. Continues to struggle with his weight. Sugars are high. Has had repeat A1C - over 8 still. He admits that he eats too much bread. He has started walking 30 to 45 minutes every day.    Current Outpatient Prescriptions  Medication Sig Dispense Refill  . aspirin EC 81 MG tablet Take 1 tablet (81 mg total) by mouth daily.      . budesonide-formoterol (SYMBICORT) 160-4.5 MCG/ACT inhaler Inhale 2 puffs into the lungs 2 (two) times daily.  1 Inhaler  3  . carvedilol (COREG) 25 MG tablet Take 1 tablet (25 mg total) by mouth 2 (two) times daily with a meal.      . glucose blood test strip Use as instructed  100 each  12  . glucose monitoring kit (FREESTYLE) monitoring kit 1 each by Does not apply route as needed for other.  1 each  0  .  losartan (COZAAR) 25 MG tablet Take 2 tablets (50 mg total) by mouth daily.  90 tablet  3  . metFORMIN (GLUCOPHAGE-XR) 500 MG 24 hr tablet TAKE TWO TABLETS BY MOUTH IN THE MORNING WITH BREAKFAST  60 tablet  0  . pravastatin (PRAVACHOL) 40 MG tablet Take 1 tablet (40 mg total) by mouth daily.  90 tablet  3  . spironolactone (ALDACTONE) 25 MG tablet Take 1/2 tablet by mouth once daily  45 tablet  3   No current facility-administered medications for this visit.    No Known Allergies  Past Medical History  Diagnosis Date  . Coronary artery disease     drug eluting stent RCA 2005-EF 30% at that time - s/p CABG x 4  . Cardiomyopathy     alcohol use related  . Asthma   . Hypertension   . Urinary incontinence   . Hyperlipidemia   . History of hypogonadism   . Pneumonia 02/16/2012    "today makes third time in my life"  . OSA on CPAP 02/16/2012    "wear mask sometimes"  . H/O hiatal hernia   . GERD (gastroesophageal reflux disease)   . Migraines 02/16/2012    "used to"  .  Anxiety   . Diabetes mellitus type II     Type 2 NIDDM x 7 years  . History of kidney stones 06/2012  . Atrial fibrillation-postoperative 11/28/2012    Past Surgical History  Procedure Laterality Date  . Tonsillectomy  ~ 1970  . Coronary angioplasty with stent placement  ~ 2003  . Refractive surgery  1990's    bilaterally  . Coronary artery bypass graft N/A 08/15/2012    Procedure: CORONARY ARTERY BYPASS GRAFTING (CABG);  Surgeon: Ivin Poot, MD;  Location: [facility] [facility];  Service: Open Heart Surgery;  Laterality: N/A;  Coronary Artery Bypass Grafting Times Four Using Left Internal Mammary Artery and Right Saphenous Leg Vein Harvested Endoscopically    History  Smoking status  . Never Smoker   Smokeless tobacco  . Current User  . Types: Chew    Comment: 02/16/2012 smoking cessation materials offered; pt declines    History  Alcohol Use  . Yes    Comment: socially occasional    Family History  Problem  Relation Age of Onset  . Hypertension Mother   . Diabetes Mother   . Hypertension Father   . Diabetes Father   . Hyperlipidemia Father     Review of Systems: The review of systems is per the HPI.  All other systems were reviewed and are negative.  Physical Exam: BP 110/80  Pulse 72  Ht _0  (1.753 m)  Wt 241 lb (109.317 kg)  BMI 35.57 kg/m2 Patient is very pleasant and in no acute distress. Skin is warm and dry. Color is normal.  HEENT is unremarkable. Normocephalic/atraumatic. PERRL. Sclera are nonicteric. Neck is supple. No masses. No JVD. Lungs are clear. Cardiac exam shows a regular rate and rhythm. Abdomen is soft. Extremities are without edema. Gait and ROM are intact. No gross neurologic deficits noted.  LABORATORY DATA:  Lab Results  Component Value Date   WBC 5.3 04/09/2013   HGB 15.2 04/09/2013   HCT 44.0 04/09/2013   PLT 199.0 04/09/2013   GLUCOSE 264* 04/09/2013   CHOL 184 04/09/2013   TRIG 288.0* 04/09/2013   HDL 35.70* 04/09/2013   LDLDIRECT 109.0 04/09/2013   LDLCALC 100* 11/26/2012   ALT 42 04/09/2013   AST 32 04/09/2013   NA 139 04/09/2013   K 4.7 04/09/2013   CL 104 04/09/2013   CREATININE 1.2 04/09/2013   BUN 25* 04/09/2013   CO2 22 04/09/2013   TSH 2.025 08/17/2012   PSA 0.24 03/10/2010   INR 1.32 08/15/2012   HGBA1C 8.7* 04/09/2013   MICROALBUR 3.4* 04/09/2013    Echo Study Conclusions  - Left ventricle: Inferior wall hypokinesis. RWMA;s hard to judge due to poor image quality. Consider MRI for quantitative EF The cavity size was mildly dilated. Wall thickness was increased in a pattern of mild LVH. The estimated ejection fraction was 40%. - Left atrium: The atrium was mildly dilated. - Atrial septum: No defect or patent foramen ovale was identified.  Notes Recorded by Deboraha Sprang, MD on 03/31/2013 at 7:44 AM L i think the EF if really 40 is great, i think if he is inclined strongly towards ICD then MRI makes sense o/w continue Guideline directed medical  therapy     Assessment / Plan: 1. Ischemic CM - EF now at 40% - looks compensated. I have left him on his current regimen. He is not strongly inclined towards an ICD - will continue with medical therapy. See back in 3 months.   2. HTN -  BP much better.   3. HLD - on statin  4. Obesity - discussed again in detail.  5. DM - uncontrolled - has seen his PCP recently with plan in process.   6. Mild CRI - will need to follow - I think it is ok to leave him on his current regimen. Will need repeat BMET on return.   Patient is agreeable to this plan and will call if any problems develop in the interim.   Burtis Junes, RN, [facility] [address] [place], [place]  [address]

## 2013-04-11 NOTE — Patient Instructions (Addendum)
Stay on your current medicines  Keep up the walking - work on your diet  Here are my tips to lose weight:  1. Drink only water. You do not need milk, juice, tea, soda or diet soda.  2. Do not eat anything "white". This includes white bread, potatoes, rice or mayo  3. Stay away from fried foods and sweets  4. Your portion should be the size of the palm of your hand.  5. Know what your weaknesses are and avoid.  6. Find an exercise you like and do it every day for 45 to 60 minutes.       I will see you in 3 months  Call the [place] office at [phone] if you have any questions, problems or concerns.

## 2013-04-14 NOTE — Assessment & Plan Note (Signed)
Check labs con't meds 

## 2013-04-14 NOTE — Progress Notes (Signed)
  Subjective:    Patient ID: Vincent Chandler, male    DOB: [birth year], 52 y.o.   MRN: [id number]  HPI    Review of Systems     Objective:   Physical Exam BP 132/88  Pulse 94  Temp(Src) 98.2 F (36.8 C) (Oral)  Wt 241 lb 12.8 oz (109.68 kg)  BMI 35.69 kg/m2  SpO2 94% General appearance: alert, cooperative, appears stated age and no distress Throat: lips, mucosa, and tongue normal; teeth and gums normal Neck: no adenopathy, no carotid bruit, no JVD, supple, symmetrical, trachea midline and thyroid not enlarged, symmetric, no tenderness/mass/nodules Lungs: clear to auscultation bilaterally Heart: S1, S2 normal Extremities: extremities normal, atraumatic, no cyanosis or edema Sensory exam of the foot is normal, tested with the monofilament. Good pulses, no lesions or ulcers, good peripheral pulses.        Assessment & Plan:

## 2013-04-14 NOTE — Assessment & Plan Note (Signed)
Check labs con't meds

## 2013-04-14 NOTE — Assessment & Plan Note (Signed)
Per cardiology 

## 2013-04-14 NOTE — Assessment & Plan Note (Signed)
Check labs F/u cardiology

## 2013-04-14 NOTE — Assessment & Plan Note (Signed)
con't meds stable

## 2013-04-14 NOTE — Assessment & Plan Note (Signed)
Check labs 

## 2013-04-16 ENCOUNTER — Telehealth: Payer: Self-pay

## 2013-04-16 MED ORDER — SITAGLIP PHOS-METFORMIN HCL ER 100-1000 MG PO TB24: 1.0000 | ORAL_TABLET | Freq: Every day | ORAL | Status: DC

## 2013-04-16 MED ORDER — ATORVASTATIN CALCIUM 20 MG PO TABS: 20.0000 mg | ORAL_TABLET | Freq: Every day | ORAL | Status: DC

## 2013-04-16 NOTE — Telephone Encounter (Signed)
Patient is aware and the medication has been sent.     KP

## 2013-04-16 NOTE — Telephone Encounter (Signed)
Message copied by Ewing Schlein on Wed Apr 16, 2013  4:08 PM ------      Message from: Rosalita Chessman      Created: Mon Apr 14, 2013  1:10 PM       D/c pravachol ----check lipitor 20 mg #30  1 po qhs, #30  2 refills      Stop metformin----start janumet xr 100/1000 1 po qpm #30  2 refills      Recheck 3 months----lipid, hep bmp, hgba1c  250.01  272.4 ------

## 2013-05-16 ENCOUNTER — Encounter: Payer: Self-pay | Admitting: Family Medicine

## 2013-06-03 ENCOUNTER — Other Ambulatory Visit: Payer: Self-pay | Admitting: Internal Medicine

## 2013-06-03 ENCOUNTER — Other Ambulatory Visit: Payer: Self-pay | Admitting: *Deleted

## 2013-06-03 DIAGNOSIS — I5020 Unspecified systolic (congestive) heart failure: Secondary | ICD-10-CM

## 2013-06-03 MED ORDER — LOSARTAN POTASSIUM 25 MG PO TABS: 50.0000 mg | ORAL_TABLET | Freq: Every day | ORAL | Status: DC

## 2013-06-04 ENCOUNTER — Encounter: Payer: Self-pay | Admitting: Family Medicine

## 2013-06-13 ENCOUNTER — Telehealth: Payer: Self-pay | Admitting: Internal Medicine

## 2013-06-13 ENCOUNTER — Ambulatory Visit
Admission: RE | Admit: 2013-06-13 | Discharge: 2013-06-13 | Disposition: A | Discharge status: Home / Self Care | Payer: BC Managed Care – PPO | Source: Ambulatory Visit | Attending: Nurse Practitioner | Admitting: Nurse Practitioner

## 2013-06-13 ENCOUNTER — Other Ambulatory Visit: Payer: Self-pay

## 2013-06-13 ENCOUNTER — Telehealth: Payer: Self-pay | Admitting: Nurse Practitioner

## 2013-06-13 DIAGNOSIS — R0602 Shortness of breath: Secondary | ICD-10-CM

## 2013-06-13 MED ORDER — FUROSEMIDE 40 MG PO TABS: ORAL_TABLET | ORAL | Status: DC

## 2013-06-13 NOTE — Telephone Encounter (Signed)
See other telephone note from today 06/13/13 - this has been addressed

## 2013-06-13 NOTE — Telephone Encounter (Signed)
New message    Called to confirm pt's appt with Cecille Rubin on Monday--he wants Lori's nurse to call him to discuss some things before his appt on Monday.

## 2013-06-13 NOTE — Telephone Encounter (Signed)
New Message  Pt called states that he is experiencing SOB, seldomly // he believes that it could be asthma// request a call back from the nurse// made appt for 12/15 @ 10:30 am with NP, Cecille Rubin Gerhardt// Please call if a sooner appt is needed//SR

## 2013-06-13 NOTE — Telephone Encounter (Signed)
Returned call to patient he stated he feels like he did when he had pneumonia.Stated he gets sob with exertion or bends over.No cough,no swelling.Stated he has appointment with Truitt Merle NP 06/16/13 but is afraid to wait.Spoke to Truitt Merle NP she advised he can get a CXR today.Advised to go to Urgent Care if needed.Patient stated he will go get a CXR at [facility] now.Stated he will keep appointment with Cecille Rubin on Monday 06/16/13 at 10:30 and will go to Urgent Care over the weekend if gets worse.

## 2013-06-16 ENCOUNTER — Encounter: Payer: Self-pay | Admitting: Nurse Practitioner

## 2013-06-16 ENCOUNTER — Ambulatory Visit (INDEPENDENT_AMBULATORY_CARE_PROVIDER_SITE_OTHER): Payer: BC Managed Care – PPO | Admitting: Nurse Practitioner

## 2013-06-16 VITALS — BP 108/72 | HR 85 | Ht 69.0 in | Wt 238.0 lb

## 2013-06-16 DIAGNOSIS — I2589 Other forms of chronic ischemic heart disease: Secondary | ICD-10-CM

## 2013-06-16 DIAGNOSIS — I2510 Atherosclerotic heart disease of native coronary artery without angina pectoris: Secondary | ICD-10-CM

## 2013-06-16 DIAGNOSIS — I255 Ischemic cardiomyopathy: Secondary | ICD-10-CM

## 2013-06-16 DIAGNOSIS — R0602 Shortness of breath: Secondary | ICD-10-CM

## 2013-06-16 LAB — CBC WITH DIFFERENTIAL/PLATELET
Basophils Absolute: 0 10*3/uL (ref 0.0–0.1)
Basophils Relative: 0.2 % (ref 0.0–3.0)
Eosinophils Absolute: 0.4 10*3/uL (ref 0.0–0.7)
Eosinophils Relative: 6.6 % — ABNORMAL HIGH (ref 0.0–5.0)
HCT: 44.9 % (ref 39.0–52.0)
Hemoglobin: 15.1 g/dL (ref 13.0–17.0)
Lymphocytes Relative: 19.5 % (ref 12.0–46.0)
Lymphs Abs: 1.3 10*3/uL (ref 0.7–4.0)
MCHC: 33.6 g/dL (ref 30.0–36.0)
MCV: 107.4 fl — ABNORMAL HIGH (ref 78.0–100.0)
Monocytes Absolute: 0.7 10*3/uL (ref 0.1–1.0)
Monocytes Relative: 11 % (ref 3.0–12.0)
Neutro Abs: 4.1 10*3/uL (ref 1.4–7.7)
Neutrophils Relative %: 62.7 % (ref 43.0–77.0)
Platelets: 269 10*3/uL (ref 150.0–400.0)
RBC: 4.18 Mil/uL — ABNORMAL LOW (ref 4.22–5.81)
RDW: 16.4 % — ABNORMAL HIGH (ref 11.5–14.6)
WBC: 6.5 10*3/uL (ref 4.5–10.5)

## 2013-06-16 LAB — BASIC METABOLIC PANEL
BUN: 21 mg/dL (ref 6–23)
CO2: 29 mEq/L (ref 19–32)
Calcium: 9.6 mg/dL (ref 8.4–10.5)
Chloride: 100 mEq/L (ref 96–112)
Creatinine, Ser: 1.3 mg/dL (ref 0.4–1.5)
GFR: 62.75 mL/min (ref >60.00)
Glucose, Bld: 171 mg/dL — ABNORMAL HIGH (ref 70–99)
Potassium: 3.8 mEq/L (ref 3.5–5.1)
Sodium: 138 mEq/L (ref 135–145)

## 2013-06-16 MED ORDER — SPIRONOLACTONE 25 MG PO TABS: ORAL_TABLET | ORAL | Status: DC

## 2013-06-16 MED ORDER — AMOXICILLIN-POT CLAVULANATE 875-125 MG PO TABS: 1.0000 | ORAL_TABLET | Freq: Two times a day (BID) | ORAL | Status: DC

## 2013-06-16 NOTE — Patient Instructions (Addendum)
Let's check labs today  I have refilled the aldactone  I sent in a prescription for amoxicillin to take 2 times a day for a week  Keep your regular visit with me for January  Keep using the Mucinex, tylenol and drink plenty of fluids (but watch the salt)  Call the [place] office at [phone] if you have any questions, problems or concerns.

## 2013-06-16 NOTE — Progress Notes (Signed)
Vincent Chandler Date of Birth: 1960/09/03 Medical Record #[id number]  History of Present Illness: Vincent Chandler is seen back today for a work in visit. Seen for Dr. Caryl Comes. He has an ischemic CM. EF down to 35% in 2012 and abnormal Myoview in 2012 as well. Then was lost to follow up and got off track with his health care. His other issues include HTN, DM, obesity, OSA and HLD.   Has had repeat cath earlier in 2014 showing EF of 30%. Referred for cardiac cath and then went on to have CABG. Had post op AF and was on amiodarone. We have titrated back up his medicines. Had follow up echo 3 months post CABG - EF at 30 to 35% and now at 40% per echo from 03/2013. No MR noted.   Saw Dr. Caryl Comes in May of 2014 - changed to Coreg from metoprolol. Amiodarone was stopped. ACE was changed to ARB due to cough. Aldactone was added as well. EF is 40% and he has opted to continue with medical therapy.   Comes back today - this is an early visit. Called on Friday with shortness of breath - thought he had pneumonia - CXR with minimal bibasilar atelectasis - no overt pneumonia. Did give him some lasix for the weekend. Feels a little better today. No fever or chills. Coughing up "brown" sputum. Head feels full. Admits that he has gotten off track with taking care of himself - going thru a break up. No longer on his low testosterone therapy. Some dyspnea. The lasix did help as well.   Current Outpatient Prescriptions  Medication Sig Dispense Refill  . aspirin EC 81 MG tablet Take 1 tablet (81 mg total) by mouth daily.      Marland Kitchen atorvastatin (LIPITOR) 20 MG tablet Take 1 tablet (20 mg total) by mouth at bedtime.  30 tablet  2  . budesonide-formoterol (SYMBICORT) 160-4.5 MCG/ACT inhaler Inhale 2 puffs into the lungs 2 (two) times daily.  1 Inhaler  3  . carvedilol (COREG) 25 MG tablet Take 1 tablet (25 mg total) by mouth 2 (two) times daily with a meal.      . furosemide (LASIX) 40 MG tablet Take 40 mg daily if needed swelling  30  tablet  3  . glucose blood test strip Use as instructed  100 each  12  . glucose monitoring kit (FREESTYLE) monitoring kit 1 each by Does not apply route as needed for other.  1 each  0  . losartan (COZAAR) 25 MG tablet Take 25 mg by mouth daily.      . pravastatin (PRAVACHOL) 40 MG tablet Take 1 tablet (40 mg total) by mouth daily.  90 tablet  3  . SitaGLIPtin-MetFORMIN HCl [phone] MG TB24 Take 1 tablet by mouth at bedtime.  30 tablet  2  . spironolactone (ALDACTONE) 25 MG tablet Take 1/2 tablet by mouth once daily  45 tablet  3   No current facility-administered medications for this visit.    No Known Allergies  Past Medical History  Diagnosis Date  . Coronary artery disease     drug eluting stent RCA 2005-EF 30% at that time - s/p CABG x 4  . Cardiomyopathy     alcohol use related  . Asthma   . Hypertension   . Urinary incontinence   . Hyperlipidemia   . History of hypogonadism   . Pneumonia 02/16/2012    "today makes third time in my life"  . OSA on CPAP  02/16/2012    "wear mask sometimes"  . H/O hiatal hernia   . GERD (gastroesophageal reflux disease)   . Migraines 02/16/2012    "used to"  . Anxiety   . Diabetes mellitus type II     Type 2 NIDDM x 7 years  . History of kidney stones 06/2012  . Atrial fibrillation-postoperative 11/28/2012    Past Surgical History  Procedure Laterality Date  . Tonsillectomy  ~ 1970  . Coronary angioplasty with stent placement  ~ 2003  . Refractive surgery  1990's    bilaterally  . Coronary artery bypass graft N/A 08/15/2012    Procedure: CORONARY ARTERY BYPASS GRAFTING (CABG);  Surgeon: Ivin Poot, MD;  Location: [facility];  Service: Open Heart Surgery;  Laterality: N/A;  Coronary Artery Bypass Grafting Times Four Using Left Internal Mammary Artery and Right Saphenous Leg Vein Harvested Endoscopically    History  Smoking status  . Never Smoker   Smokeless tobacco  . Current User  . Types: Chew    Comment: 02/16/2012 smoking  cessation materials offered; pt declines    History  Alcohol Use  . Yes    Comment: socially occasional    Family History  Problem Relation Age of Onset  . Hypertension Mother   . Diabetes Mother   . Hypertension Father   . Diabetes Father   . Hyperlipidemia Father     Review of Systems: The review of systems is per the HPI.  All other systems were reviewed and are negative.  Physical Exam: BP 108/72  Pulse 85  Ht _0  (1.753 m)  Wt 238 lb (107.956 kg)  BMI 35.13 kg/m2  SpO2 90% Patient is very pleasant and in no acute distress. He sounds like he is sick. Skin is warm and dry. Color is normal.  HEENT is unremarkable. Normocephalic/atraumatic. PERRL. Sclera are nonicteric. Neck is supple. No masses. No JVD. Lungs are clear. Cardiac exam shows a regular rate and rhythm. Abdomen is soft. Extremities are without edema. Gait and ROM are intact. No gross neurologic deficits noted.  Wt Readings from Last 3 Encounters:  06/16/13 238 lb (107.956 kg)  04/11/13 241 lb (109.317 kg)  04/09/13 241 lb 12.8 oz (109.68 kg)     LABORATORY DATA: PENDING  Lab Results  Component Value Date   WBC 5.3 04/09/2013   HGB 15.2 04/09/2013   HCT 44.0 04/09/2013   PLT 199.0 04/09/2013   GLUCOSE 264* 04/09/2013   CHOL 184 04/09/2013   TRIG 288.0* 04/09/2013   HDL 35.70* 04/09/2013   LDLDIRECT 109.0 04/09/2013   LDLCALC 100* 11/26/2012   ALT 42 04/09/2013   AST 32 04/09/2013   NA 139 04/09/2013   K 4.7 04/09/2013   CL 104 04/09/2013   CREATININE 1.2 04/09/2013   BUN 25* 04/09/2013   CO2 22 04/09/2013   TSH 2.025 08/17/2012   PSA 0.24 03/10/2010   INR 1.32 08/15/2012   HGBA1C 8.7* 04/09/2013   MICROALBUR 3.4* 04/09/2013   Echo Study Conclusions from September 2014  - Left ventricle: Inferior wall hypokinesis. RWMA;s hard to judge due to poor image quality. Consider MRI for quantitative EF The cavity size was mildly dilated. Wall thickness was increased in a pattern of mild LVH. The estimated ejection  fraction was 40%. - Left atrium: The atrium was mildly dilated. - Atrial septum: No defect or patent foramen ovale was identified.  Notes Recorded by Deboraha Sprang, MD on 03/31/2013 at 7:44 AM L i think the EF  if really 40 is great, i think if he is inclined strongly towards ICD then MRI makes sense o/w continue Guideline directed medical therapy     Dg Chest 2 View  06/13/2013   CLINICAL DATA:  Shortness of breath for 4 days. No fever. History of pneumonia.  EXAM: CHEST  2 VIEW  COMPARISON:  09/11/2012  FINDINGS: Sequelae of prior CABG are again identified. The cardiac silhouette remains enlarged, unchanged. The lungs are well inflated with minimal linear opacity in both lung bases. There is no evidence of segmental airspace consolidation, overt edema, pleural effusion, or pneumothorax. No acute osseous abnormality is identified.  IMPRESSION: Minimal bibasilar atelectasis and stable cardiomegaly.   Electronically Signed   By: Logan Bores   On: 06/13/2013 14:39    Assessment / Plan: 1. Dyspnea - probably multifactorial - URI/CHF - will give him Amoxicillin for a week. Check labs today to include CBC, BNP and BMP. Continue with his CHF medicines as well. See him back as planned next month.   2. Ischemic CM - EF of 40%  3. HTN - BP ok with current regimen.  4. HLD  5. DM  Patient is agreeable to this plan and will call if any problems develop in the interim.   Burtis Junes, RN, [facility] [address] [place], [place]  [address]

## 2013-06-17 LAB — BRAIN NATRIURETIC PEPTIDE: Pro B Natriuretic peptide (BNP): 165 pg/mL — ABNORMAL HIGH (ref 0.0–100.0)

## 2013-07-11 ENCOUNTER — Ambulatory Visit: Payer: BC Managed Care – PPO | Admitting: Nurse Practitioner

## 2013-07-25 ENCOUNTER — Encounter: Payer: Self-pay | Admitting: Nurse Practitioner

## 2013-07-25 ENCOUNTER — Ambulatory Visit (INDEPENDENT_AMBULATORY_CARE_PROVIDER_SITE_OTHER): Payer: BC Managed Care – PPO | Admitting: Nurse Practitioner

## 2013-07-25 ENCOUNTER — Other Ambulatory Visit: Payer: Self-pay | Admitting: General Surgery

## 2013-07-25 VITALS — BP 120/86 | HR 86 | Ht 69.5 in | Wt 241.8 lb

## 2013-07-25 DIAGNOSIS — Z79899 Other long term (current) drug therapy: Secondary | ICD-10-CM

## 2013-07-25 DIAGNOSIS — I255 Ischemic cardiomyopathy: Secondary | ICD-10-CM

## 2013-07-25 DIAGNOSIS — I2589 Other forms of chronic ischemic heart disease: Secondary | ICD-10-CM

## 2013-07-25 LAB — BASIC METABOLIC PANEL
BUN: 26 mg/dL — ABNORMAL HIGH (ref 6–23)
CO2: 25 mEq/L (ref 19–32)
Calcium: 9.5 mg/dL (ref 8.4–10.5)
Chloride: 102 mEq/L (ref 96–112)
Creatinine, Ser: 1.5 mg/dL (ref 0.4–1.5)
GFR: 52.24 mL/min — ABNORMAL LOW (ref >60.00)
Glucose, Bld: 278 mg/dL — ABNORMAL HIGH (ref 70–99)
Potassium: 4.2 mEq/L (ref 3.5–5.1)
Sodium: 137 mEq/L (ref 135–145)

## 2013-07-25 MED ORDER — LOSARTAN POTASSIUM 50 MG PO TABS: 50.0000 mg | ORAL_TABLET | Freq: Every day | ORAL | Status: DC

## 2013-07-25 NOTE — Progress Notes (Signed)
Vincent Chandler Date of Birth: 1961/03/25 Medical Record #[id number]  History of Present Illness: Vincent Chandler is seen back today for his regular follow up visit. Seen for Dr. Caryl Chandler. He has an ischemic CM. EF down to 35% in 2012 and abnormal Myoview in 2012 as well. Then was lost to follow up and got off track with his health care. His other issues include HTN, DM, obesity, OSA and HLD.   Has had repeat cath earlier in 2014 showing EF of 30%. Referred for cardiac cath and then went on to have CABG. Had post op AF and was on amiodarone. We have titrated back up his medicines. Had follow up echo 3 months post CABG - EF at 30 to 35% and now at 40% per echo from 03/2013. No MR noted.   Saw Dr. Caryl Chandler in May of 2014 - changed to Coreg from metoprolol. Amiodarone was stopped. ACE was changed to ARB due to cough. Aldactone was added as well. EF is 40% and he has opted to continue with medical therapy.   Seen last month with dyspnea and congestion - I treated him with antibiotics.   Chandler back today. Here alone. Doing better. Breathing got better. No more cough. Rare swelling. Still struggles with his weight. Only exercising a couple of times per week. Tolerating his medicines. Still with some numbness over his sternal incision. Overall he is doing ok.     Current Outpatient Prescriptions  Medication Sig Dispense Refill  . aspirin EC 81 MG tablet Take 1 tablet (81 mg total) by mouth daily.      Marland Kitchen atorvastatin (LIPITOR) 20 MG tablet Take 1 tablet (20 mg total) by mouth at bedtime.  30 tablet  2  . budesonide-formoterol (SYMBICORT) 160-4.5 MCG/ACT inhaler Inhale 2 puffs into the lungs 2 (two) times daily.  1 Inhaler  3  . carvedilol (COREG) 25 MG tablet Take 1 tablet (25 mg total) by mouth 2 (two) times daily with a meal.      . furosemide (LASIX) 40 MG tablet Take 40 mg daily if needed swelling  30 tablet  3  . glucose blood test strip Use as instructed  100 each  12  . glucose monitoring kit (FREESTYLE)  monitoring kit 1 each by Does not apply route as needed for other.  1 each  0  . losartan (COZAAR) 25 MG tablet Take 25 mg by mouth daily.      . pravastatin (PRAVACHOL) 40 MG tablet Take 1 tablet (40 mg total) by mouth daily.  90 tablet  3  . SitaGLIPtin-MetFORMIN HCl [id number] MG TB24 Take 1 tablet by mouth at bedtime.  30 tablet  2  . spironolactone (ALDACTONE) 25 MG tablet Take 1/2 tablet by mouth once daily  45 tablet  3   No current facility-administered medications for this visit.    No Known Allergies  Past Medical History  Diagnosis Date  . Coronary artery disease     drug eluting stent RCA 2005-EF 30% at that time - s/p CABG x 4  . Cardiomyopathy     alcohol use related  . Asthma   . Hypertension   . Urinary incontinence   . Hyperlipidemia   . History of hypogonadism   . Pneumonia 02/16/2012    "today makes third time in my life"  . OSA on CPAP 02/16/2012    "wear mask sometimes"  . H/O hiatal hernia   . GERD (gastroesophageal reflux disease)   . Migraines 02/16/2012    "  used to"  . Anxiety   . Diabetes mellitus type II     Type 2 NIDDM x 7 years  . History of kidney stones 06/2012  . Atrial fibrillation-postoperative 11/28/2012    Past Surgical History  Procedure Laterality Date  . Tonsillectomy  ~ 1970  . Coronary angioplasty with stent placement  ~ 2003  . Refractive surgery  1990's    bilaterally  . Coronary artery bypass graft N/A 08/15/2012    Procedure: CORONARY ARTERY BYPASS GRAFTING (CABG);  Surgeon: Vincent Poot, MD;  Location: [facility];  Service: Open Heart Surgery;  Laterality: N/A;  Coronary Artery Bypass Grafting Times Four Using Left Internal Mammary Artery and Right Saphenous Leg Vein Harvested Endoscopically    History  Smoking status  . Never Smoker   Smokeless tobacco  . Current User  . Types: Chew    Comment: 02/16/2012 smoking cessation materials offered; pt declines    History  Alcohol Use  . Yes    Comment: socially occasional     Family History  Problem Relation Age of Onset  . Hypertension Mother   . Diabetes Mother   . Hypertension Father   . Diabetes Father   . Hyperlipidemia Father     Review of Systems: The review of systems is per the HPI.  All other systems were reviewed and are negative.  Physical Exam: BP 120/86  Pulse 86  Ht 5' 9.5" (1.765 m)  Wt 241 lb 12.8 oz (109.68 kg)  BMI 35.21 kg/m2 Patient is very pleasant and in no acute distress. He remains obese. Skin is warm and dry. Color is normal.  HEENT is unremarkable. Normocephalic/atraumatic. PERRL. Sclera are nonicteric. Neck is supple. No masses. No JVD. Lungs are clear. Cardiac exam shows a regular rate and rhythm. Abdomen is soft. Extremities are without edema. Gait and ROM are intact. No gross neurologic deficits noted.  Wt Readings from Last 3 Encounters:  07/25/13 241 lb 12.8 oz (109.68 kg)  06/16/13 238 lb (107.956 kg)  04/11/13 241 lb (109.317 kg)     LABORATORY DATA: Lab Results  Component Value Date   WBC 6.5 06/16/2013   HGB 15.1 06/16/2013   HCT 44.9 06/16/2013   PLT 269.0 06/16/2013   GLUCOSE 171* 06/16/2013   CHOL 184 04/09/2013   TRIG 288.0* 04/09/2013   HDL 35.70* 04/09/2013   LDLDIRECT 109.0 04/09/2013   LDLCALC 100* 11/26/2012   ALT 42 04/09/2013   AST 32 04/09/2013   NA 138 06/16/2013   K 3.8 06/16/2013   CL 100 06/16/2013   CREATININE 1.3 06/16/2013   BUN 21 06/16/2013   CO2 29 06/16/2013   TSH 2.025 08/17/2012   PSA 0.24 03/10/2010   INR 1.32 08/15/2012   HGBA1C 8.7* 04/09/2013   MICROALBUR 3.4* 04/09/2013   Echo Study Conclusions from September 2014  - Left ventricle: Inferior wall hypokinesis. RWMA;s hard to judge due to poor image quality. Consider MRI for quantitative EF The cavity size was mildly dilated. Wall thickness was increased in a pattern of mild LVH. The estimated ejection fraction was 40%. - Left atrium: The atrium was mildly dilated. - Atrial septum: No defect or patent foramen ovale  was identified.  Notes Recorded by Deboraha Sprang, MD on 03/31/2013 at 7:44 AM L i think the EF if really 40 is great, i think if he is inclined strongly towards ICD then MRI makes sense o/w continue Guideline directed medical therapy    Dg Chest 2 View  06/13/2013  CLINICAL DATA: Shortness of breath for 4 days. No fever. History of pneumonia. EXAM: CHEST 2 VIEW COMPARISON: 09/11/2012 FINDINGS: Sequelae of prior CABG are again identified. The cardiac silhouette remains enlarged, unchanged. The lungs are well inflated with minimal linear opacity in both lung bases. There is no evidence of segmental airspace consolidation, overt edema, pleural effusion, or pneumothorax. No acute osseous abnormality is identified. IMPRESSION: Minimal bibasilar atelectasis and stable cardiomegaly. Electronically Signed By: Logan Bores On: 06/13/2013 14:39   Assessment / Plan:  1. Dyspnea - probably multifactorial - he has improved.   2. Ischemic CM - EF of 40% - will increase his ARB today. See him back in 2 months. Recheck BMET today. Hope to continue to titrate his medicines and repeat his echo later this year.   3. HTN - BP ok with current regimen.   4. HLD   5. DM   6. Obesity - discussed in detail - he admits that he knows what he needs to do   Patient is agreeable to this plan and will call if any problems develop in the interim.  Burtis Junes, RN, [place]  [address] [place]  [place], [place] [address]

## 2013-07-25 NOTE — Patient Instructions (Signed)
Continue with your current medicines but I am increasing your Losartan to 50 mg a day - this is at the drug store - you can take 2 of your 25 mg tablets together to equal this dose and use those up  Weigh yourself each morning and record.  We will check lab today  Take extra dose of diuretic for weight gain of 3 pounds in 24 hours.   Limit sodium intake. Goal is to have less than 2000 mg (2gm) of salt per day.  I will see you in 2 months  Call the [place] office at [phone] if you have any questions, problems or concerns.

## 2013-07-28 ENCOUNTER — Other Ambulatory Visit: Payer: Self-pay | Admitting: Family Medicine

## 2013-08-05 ENCOUNTER — Other Ambulatory Visit: Payer: Self-pay

## 2013-08-05 MED ORDER — SITAGLIP PHOS-METFORMIN HCL ER 100-1000 MG PO TB24: 1.0000 | ORAL_TABLET | Freq: Every day | ORAL | Status: DC

## 2013-08-15 ENCOUNTER — Other Ambulatory Visit: Payer: BC Managed Care – PPO

## 2013-08-30 ENCOUNTER — Other Ambulatory Visit: Payer: Self-pay | Admitting: Family Medicine

## 2013-10-03 ENCOUNTER — Other Ambulatory Visit: Payer: Self-pay | Admitting: Family Medicine

## 2013-10-06 NOTE — Telephone Encounter (Signed)
Letter mailed     KP

## 2013-10-12 ENCOUNTER — Other Ambulatory Visit: Payer: Self-pay | Admitting: Nurse Practitioner

## 2013-11-11 ENCOUNTER — Other Ambulatory Visit: Payer: Self-pay | Admitting: Family Medicine

## 2013-11-27 ENCOUNTER — Telehealth: Payer: Self-pay | Admitting: Nurse Practitioner

## 2013-11-27 NOTE — Telephone Encounter (Signed)
Called stating he is having the SOB again and would like to see Kathrene Alu asap.  Advised her first available is the end of June.  He states that will be fine.  Made him an appointment for 6/29 at 9:30.  Advised if he became worse to call back.

## 2013-11-27 NOTE — Telephone Encounter (Signed)
New message          Pt would like to be seen asap / Can you work pt in?

## 2013-11-28 ENCOUNTER — Ambulatory Visit (INDEPENDENT_AMBULATORY_CARE_PROVIDER_SITE_OTHER): Payer: BC Managed Care – PPO | Admitting: Physician Assistant

## 2013-11-28 VITALS — BP 146/94 | HR 106 | Temp 97.8°F | Resp 24 | Ht 69.75 in | Wt 246.4 lb

## 2013-11-28 DIAGNOSIS — G4733 Obstructive sleep apnea (adult) (pediatric): Secondary | ICD-10-CM

## 2013-11-28 DIAGNOSIS — I10 Essential (primary) hypertension: Secondary | ICD-10-CM

## 2013-11-28 DIAGNOSIS — J45909 Unspecified asthma, uncomplicated: Secondary | ICD-10-CM

## 2013-11-28 DIAGNOSIS — J329 Chronic sinusitis, unspecified: Secondary | ICD-10-CM

## 2013-11-28 MED ORDER — AMOXICILLIN-POT CLAVULANATE 875-125 MG PO TABS: 1.0000 | ORAL_TABLET | Freq: Two times a day (BID) | ORAL | Status: DC

## 2013-11-28 MED ORDER — BUDESONIDE-FORMOTEROL FUMARATE 160-4.5 MCG/ACT IN AERO: 2.0000 | INHALATION_SPRAY | Freq: Two times a day (BID) | RESPIRATORY_TRACT | Status: DC

## 2013-11-28 MED ORDER — GUAIFENESIN ER 1200 MG PO TB12: 1.0000 | ORAL_TABLET | Freq: Two times a day (BID) | ORAL | Status: DC | PRN

## 2013-11-28 MED ORDER — FLUTICASONE PROPIONATE 50 MCG/ACT NA SUSP: 2.0000 | Freq: Every day | NASAL | Status: DC

## 2013-11-28 NOTE — Patient Instructions (Signed)
Contact Dr. Nonda Lou office about getting a replacement CPAP set up. Using CPAP regularly will really help you.

## 2013-11-28 NOTE — Progress Notes (Signed)
 Subjective:    Patient ID: Vincent Chandler, male    DOB: 04/11/1961, 53 y.o.   MRN: [id number]   PCP: Yvonne Lowne, DO  Chief Complaint  Patient presents with  . Sinusitis    x 1 wk  . Facial Pain  . Fatigue  . Shortness of Breath    Medications, allergies, past medical history, surgical history, family history, social history and problem list reviewed and updated.  Patient Active Problem List   Diagnosis Date Noted  . Atrial fibrillation-postoperative 11/28/2012  . Acute renal failure 06/05/2012  . Kidney stone 06/04/2012  . CHF (congestive heart failure) 06/04/2012  . Pneumonia 02/16/2012  . Sinus tachycardia 12/29/2010  . Coronary artery disease prior RCA stent with new inferior Q waves 10/18/2010  . Ischemic and nonischemic cardiomyopathy   10/18/2010  . DIABETES MELLITUS, TYPE II 03/10/2010  . HYPERLIPIDEMIA 03/10/2010  . HYPERTENSION 03/10/2010  . ASTHMA 03/10/2010  . MYALGIA 03/10/2010  . URINARY INCONTINENCE 03/10/2010   Prior to Admission medications   Medication Sig Start Date End Date Taking? Authorizing Provider  aspirin EC 81 MG tablet Take 1 tablet (81 mg total) by mouth daily. 11/28/12  Yes Steven C Klein, MD  atorvastatin (LIPITOR) 20 MG tablet 1 tab daily at bedtime--labs are due now 11/11/13  Yes Yvonne R Lowne, DO  budesonide-formoterol (SYMBICORT) 160-4.5 MCG/ACT inhaler Inhale 2 puffs into the lungs 2 (two) times daily. 10/22/12  Yes Lori C Gerhardt, NP  carvedilol (COREG) 25 MG tablet Take 1 tablet (25 mg total) by mouth 2 (two) times daily with a meal. 12/27/12  Yes Lori C Gerhardt, NP  furosemide (LASIX) 40 MG tablet take 40 mg's(1 tablet) daily if needed for swelling   Yes Lori C Gerhardt, NP  glucose blood test strip Use as instructed 04/09/13  Yes Yvonne R Lowne, DO  glucose monitoring kit (FREESTYLE) monitoring kit 1 each by Does not apply route as needed for other. 04/09/13  Yes Yvonne R Lowne, DO  JANUMET XR 100-1000 MG TB24 TAKE ONE TABLET BY MOUTH  AT BEDTIME  11/11/13  Yes Yvonne R Lowne, DO  losartan (COZAAR) 50 MG tablet Take 1 tablet (50 mg total) by mouth daily. 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 "I've got a terrible sinus infection." "I've been sort of feeling fatigued." No chest pressure, tightness or pain. LEFT sided rib soreness, worse with cough. Very thick, greenish, nasal drainage.  Cough is nonproductive. No fever, chills, nausea/vomiting. No SOB, dizziness.  He has OSA, but doesn't regularly use his CPAP. It's an old machine and may not work properly, but he hasn't tried to get it repaired/replaced.   Review of Systems As above.    Objective:   Physical Exam  Vitals reviewed. Constitutional: He appears well-developed and well-nourished. He is active and cooperative. No distress.  BP 146/94  Pulse 106  Temp(Src) 97.8 F (36.6 C) (Oral)  Resp 24  Ht 5' 9.75" (1.772 m)  Wt 246 lb 6.4 oz (111.766 kg)  BMI 35.59 kg/m2  SpO2 92%  When I entered the room, the patient was asleep and snoring in the chair. I woke him by calling his name out loudly.   HENT:  Right Ear: Hearing, tympanic   membrane, external ear and ear canal normal.  Left Ear: Hearing, tympanic membrane, external ear and ear canal normal.  Nose: Mucosal edema and rhinorrhea present. Right sinus exhibits maxillary sinus tenderness and frontal sinus tenderness. Left sinus exhibits maxillary sinus tenderness and frontal sinus tenderness.  Mouth/Throat: Uvula is midline, oropharynx is clear and moist and mucous membranes are normal. Normal dentition. No uvula swelling.  Sinus tenderness is mild.  Eyes: Conjunctivae and EOM are normal. Pupils are equal, round, and reactive to light.  Neck: Trachea normal and  normal range of motion. Neck supple. No mass and no thyromegaly present.  Cardiovascular: Normal rate, regular rhythm, normal heart sounds and normal pulses.   Occasional extrasystoles are present.  Pulmonary/Chest: Effort normal and breath sounds normal. He exhibits no tenderness and no bony tenderness.          Assessment & Plan:  1. Sinusitis Acute infection, with likely underlying chronic irritation/allergic-type symptoms. - amoxicillin-clavulanate (AUGMENTIN) 875-125 MG per tablet; Take 1 tablet by mouth 2 (two) times daily.  Dispense: 20 tablet; Refill: 0 - Guaifenesin (MUCINEX MAXIMUM STRENGTH) 1200 MG TB12; Take 1 tablet (1,200 mg total) by mouth every 12 (twelve) hours as needed.  Dispense: 14 tablet; Refill: 1 - fluticasone (FLONASE) 50 MCG/ACT nasal spray; Place 2 sprays into both nostrils daily.  Dispense: 16 g; Refill: 12  2. HTN (hypertension) Elevated today, likely due to illness, but usually controlled.  3. ASTHMA Stable. Refilled his regular medication. - budesonide-formoterol (SYMBICORT) 160-4.5 MCG/ACT inhaler; Inhale 2 puffs into the lungs 2 (two) times daily.  Dispense: 1 Inhaler; Refill: 3  4. Obstructive sleep apnea Advised of the benefits of using CPAP regularly, and risks of not.  Advised to contact PCP in order to get a new CPAP set up or repair of his current machine.    S. , PA-C Physician Assistant-Certified [facility]  

## 2013-12-01 ENCOUNTER — Other Ambulatory Visit: Payer: Self-pay | Admitting: Internal Medicine

## 2013-12-15 ENCOUNTER — Other Ambulatory Visit: Payer: Self-pay | Admitting: Family Medicine

## 2013-12-29 ENCOUNTER — Ambulatory Visit: Payer: BC Managed Care – PPO | Admitting: Nurse Practitioner

## 2014-01-19 ENCOUNTER — Telehealth: Payer: Self-pay

## 2014-01-19 DIAGNOSIS — E119 Type 2 diabetes mellitus without complications: Secondary | ICD-10-CM

## 2014-01-19 DIAGNOSIS — E785 Hyperlipidemia, unspecified: Secondary | ICD-10-CM

## 2014-01-19 NOTE — Telephone Encounter (Signed)
Diabetic bundle  mychart message sent  Pt needs A1C and lipid panel rechecked along with visit to see md and to check BP  Labs ordered

## 2014-01-19 NOTE — Telephone Encounter (Signed)
Noticed.//AB/CMA

## 2014-01-20 ENCOUNTER — Other Ambulatory Visit: Payer: Self-pay | Admitting: Family Medicine

## 2014-01-20 NOTE — Telephone Encounter (Signed)
Rx's sent to the pharmacy by e-script.  Pt needs office visit and fasting labs.//AB/CMA

## 2014-01-28 ENCOUNTER — Other Ambulatory Visit: Payer: Self-pay | Admitting: Nurse Practitioner

## 2014-02-21 ENCOUNTER — Telehealth: Payer: Self-pay

## 2014-02-21 NOTE — Telephone Encounter (Signed)
Letter sent to pt regarding diabetes maintenance.   Diabetic bundle.

## 2014-02-24 ENCOUNTER — Other Ambulatory Visit: Payer: Self-pay | Admitting: Internal Medicine

## 2014-03-05 ENCOUNTER — Other Ambulatory Visit: Payer: Self-pay

## 2014-03-05 MED ORDER — SITAGLIP PHOS-METFORMIN HCL ER 100-1000 MG PO TB24: ORAL_TABLET | ORAL | Status: DC

## 2014-03-25 ENCOUNTER — Other Ambulatory Visit: Payer: Self-pay

## 2014-03-25 MED ORDER — ATORVASTATIN CALCIUM 20 MG PO TABS: ORAL_TABLET | ORAL | Status: DC

## 2014-04-06 ENCOUNTER — Other Ambulatory Visit: Payer: Self-pay | Admitting: Nurse Practitioner

## 2014-04-27 ENCOUNTER — Other Ambulatory Visit: Payer: Self-pay | Admitting: Family Medicine

## 2014-05-12 ENCOUNTER — Ambulatory Visit (INDEPENDENT_AMBULATORY_CARE_PROVIDER_SITE_OTHER): Payer: BC Managed Care – PPO | Admitting: Family Medicine

## 2014-05-12 VITALS — BP 130/82 | HR 109 | Temp 98.1°F | Resp 16 | Ht 71.0 in | Wt 240.0 lb

## 2014-05-12 DIAGNOSIS — I25810 Atherosclerosis of coronary artery bypass graft(s) without angina pectoris: Secondary | ICD-10-CM

## 2014-05-12 DIAGNOSIS — J209 Acute bronchitis, unspecified: Secondary | ICD-10-CM

## 2014-05-12 DIAGNOSIS — J4541 Moderate persistent asthma with (acute) exacerbation: Secondary | ICD-10-CM

## 2014-05-12 MED ORDER — BUDESONIDE-FORMOTEROL FUMARATE 160-4.5 MCG/ACT IN AERO: 2.0000 | INHALATION_SPRAY | Freq: Two times a day (BID) | RESPIRATORY_TRACT | Status: DC

## 2014-05-12 MED ORDER — METHYLPREDNISOLONE ACETATE PF 80 MG/ML IJ SUSP
80.0000 mg | Freq: Once | INTRAMUSCULAR | Status: AC
  Administered 2014-05-12: 80 mg via INTRAMUSCULAR

## 2014-05-12 MED ORDER — LEVOFLOXACIN 500 MG PO TABS: 500.0000 mg | ORAL_TABLET | Freq: Every day | ORAL | Status: DC

## 2014-05-12 MED ORDER — FUROSEMIDE 40 MG PO TABS: 40.0000 mg | ORAL_TABLET | Freq: Every day | ORAL | Status: DC

## 2014-05-12 NOTE — Patient Instructions (Signed)
Acute Bronchitis Bronchitis is inflammation of the airways that extend from the windpipe into the lungs (bronchi). The inflammation often causes mucus to develop. This leads to a cough, which is the most common symptom of bronchitis.  In acute bronchitis, the condition usually develops suddenly and goes away over time, usually in a couple weeks. Smoking, allergies, and asthma can make bronchitis worse. Repeated episodes of bronchitis may cause further lung problems.  CAUSES Acute bronchitis is most often caused by the same virus that causes a cold. The virus can spread from person to person (contagious) through coughing, sneezing, and touching contaminated objects. SIGNS AND SYMPTOMS   Cough.   Fever.   Coughing up mucus.   Body aches.   Chest congestion.   Chills.   Shortness of breath.   Sore throat.  DIAGNOSIS  Acute bronchitis is usually diagnosed through a physical exam. Your health care provider will also ask you questions about your medical history. Tests, such as chest X-rays, are sometimes done to rule out other conditions.  TREATMENT  Acute bronchitis usually goes away in a couple weeks. Oftentimes, no medical treatment is necessary. Medicines are sometimes given for relief of fever or cough. Antibiotic medicines are usually not needed but may be prescribed in certain situations. In some cases, an inhaler may be recommended to help reduce shortness of breath and control the cough. A cool mist vaporizer may also be used to help thin bronchial secretions and make it easier to clear the chest.  HOME CARE INSTRUCTIONS  Get plenty of rest.   Drink enough fluids to keep your urine clear or pale yellow (unless you have a medical condition that requires fluid restriction). Increasing fluids may help thin your respiratory secretions (sputum) and reduce chest congestion, and it will prevent dehydration.   Take medicines only as directed by your health care provider.  If  you were prescribed an antibiotic medicine, finish it all even if you start to feel better.  Avoid smoking and secondhand smoke. Exposure to cigarette smoke or irritating chemicals will make bronchitis worse. If you are a smoker, consider using nicotine gum or skin patches to help control withdrawal symptoms. Quitting smoking will help your lungs heal faster.   Reduce the chances of another bout of acute bronchitis by washing your hands frequently, avoiding people with cold symptoms, and trying not to touch your hands to your mouth, nose, or eyes.   Keep all follow-up visits as directed by your health care provider.  SEEK MEDICAL CARE IF: Your symptoms do not improve after 1 week of treatment.  SEEK IMMEDIATE MEDICAL CARE IF:  You develop an increased fever or chills.   You have chest pain.   You have severe shortness of breath.  You have bloody sputum.   You develop dehydration.  You faint or repeatedly feel like you are going to pass out.  You develop repeated vomiting.  You develop a severe headache. MAKE SURE YOU:   Understand these instructions.  Will watch your condition.  Will get help right away if you are not doing well or get worse. Document Released: 07/27/2004 Document Revised: 11/03/2013 Document Reviewed: 12/10/2012 [facility] Patient Information 2015 [place], [place]. This information is not intended to replace advice given to you by your health care provider. Make sure you discuss any questions you have with your health care provider.

## 2014-05-12 NOTE — Progress Notes (Signed)
° °  Subjective:    Patient ID: Vincent Chandler, male    DOB: 1960-11-14, 53 y.o.   MRN: [id number] This chart was scribed for Vincent Haber, MD by Zola Button, Medical Scribe. This patient was seen in Room 10 and the patient's care was started at 8:25 AM.   HPI HPI Comments: Neng Albee is a 53 y.o. male with a hx of asthma and CABG who presents to the [facility] complaining of gradual onset URI symptoms that began 3 days ago. Patient notes having wheezing, SOB, productive cough, sore throat, subjective fever and diaphoresis yesterday. He denies smoking. He does not take anything for his asthma regularly.  Patient would also like a flu shot and a refill of his furosemide and Symbicort.  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   Review of Systems  Constitutional: Positive for fever and diaphoresis.  HENT: Positive for sore throat.   Eyes: Negative for discharge.  Respiratory: Positive for cough, shortness of breath and wheezing.   Genitourinary: Negative for hematuria.  Musculoskeletal: Negative for back pain.  Skin: Negative for rash.  Neurological: Negative for seizures.  Psychiatric/Behavioral: Negative for confusion.       Objective:   Physical Exam CONSTITUTIONAL: Well developed/well nourished HEAD: Normocephalic/atraumatic EYES: EOM/PERRL ENMT: Mucous membranes moist NECK: supple no meningeal signs SPINE: entire spine nontender CV: S1/S2 noted, no murmurs/rubs/gallops noted LUNGS: Lungs have course breath sounds and expiratory wheezes ABDOMEN: soft, nontender, no rebound or guarding GU: no cva tenderness NEURO: Pt is awake/alert, moves all extremitiesx4 EXTREMITIES: pulses normal, full ROM SKIN: warm, color normal PSYCH: no abnormalities of mood noted        Assessment & Plan:   Acute bronchitis, unspecified organism - Plan:  levofloxacin (LEVAQUIN) 500 MG tablet, methylPREDNISolone acetate PF (DEPO-MEDROL) injection 80 mg  Asthma with acute exacerbation, moderate persistent - Plan: budesonide-formoterol (SYMBICORT) 160-4.5 MCG/ACT inhaler  Coronary artery disease involving coronary bypass graft of native heart without angina pectoris - Plan: furosemide (LASIX) 40 MG tablet  Signed, Vincent Haber, MD

## 2014-05-25 ENCOUNTER — Ambulatory Visit: Payer: BC Managed Care – PPO | Admitting: Family Medicine

## 2014-05-25 ENCOUNTER — Telehealth: Payer: Self-pay | Admitting: *Deleted

## 2014-05-25 NOTE — Telephone Encounter (Signed)
See note below

## 2014-05-25 NOTE — Telephone Encounter (Signed)
Pt did not show for appointment 05/25/2014 at 10:15am for DM/HTN--  Fasting

## 2014-05-25 NOTE — Telephone Encounter (Signed)
charge

## 2014-06-01 ENCOUNTER — Other Ambulatory Visit: Payer: Self-pay | Admitting: Family Medicine

## 2014-06-01 ENCOUNTER — Other Ambulatory Visit: Payer: Self-pay | Admitting: Internal Medicine

## 2014-06-05 ENCOUNTER — Ambulatory Visit (INDEPENDENT_AMBULATORY_CARE_PROVIDER_SITE_OTHER): Payer: BC Managed Care – PPO | Admitting: Family Medicine

## 2014-06-05 ENCOUNTER — Encounter: Payer: Self-pay | Admitting: Family Medicine

## 2014-06-05 VITALS — BP 130/82 | HR 100 | Temp 98.0°F | Resp 16 | Ht 69.75 in | Wt 233.0 lb

## 2014-06-05 DIAGNOSIS — J329 Chronic sinusitis, unspecified: Secondary | ICD-10-CM

## 2014-06-05 DIAGNOSIS — Z23 Encounter for immunization: Secondary | ICD-10-CM

## 2014-06-05 NOTE — Progress Notes (Signed)
Patient ID: Vincent Chandler MRN: [id number], DOB: 07-07-1960, 53 y.o. Date of Encounter: 06/05/2014, 10:43 AM  Primary Physician: Garnet Koyanagi, DO  Chief Complaint:  Chief Complaint  Patient presents with  . Sinusitis  . Shortness of Breath    HPI: 53 y.o. year old male presents with 5 day history of nasal congestion, post nasal drip, sore throat, sinus pressure, and cough. Afebrile. No chills. Nasal congestion thick and green/yellow. Sinus pressure is the worst symptom. Cough is productive secondary to post nasal drip and not associated with time of day. Ears feel full, leading to sensation of muffled hearing. Has tried OTC cold preps without success. No GI complaints.   No recent antibiotics, recent travels, vomiting, or sick contacts   No leg trauma, sedentary periods, h/o cancer, or tobacco use.  Past Medical History  Diagnosis Date  . Coronary artery disease     drug eluting stent RCA 2005-EF 30% at that time - s/p CABG x 4  . Cardiomyopathy     alcohol use related  . Asthma   . Hypertension   . Urinary incontinence   . Hyperlipidemia   . History of hypogonadism   . Pneumonia 02/16/2012    "today makes third time in my life"  . OSA on CPAP 02/16/2012    "wear mask sometimes"  . H/O hiatal hernia   . GERD (gastroesophageal reflux disease)   . Migraines 02/16/2012    "used to"  . Anxiety   . Diabetes mellitus type II     Type 2 NIDDM x 7 years  . History of kidney stones 06/2012  . Atrial fibrillation-postoperative 11/28/2012     Home Meds: Prior to Admission medications   Medication Sig Start Date End Date Taking? Authorizing Provider  aspirin EC 81 MG tablet Take 1 tablet (81 mg total) by mouth daily. 11/28/12  Yes Deboraha Sprang, MD  atorvastatin (LIPITOR) 20 MG tablet TAKE ONE TABLET BY MOUTH AT BEDTIME -- last refill until office visit and fasting labs 06/01/14  Yes Alferd Apa Lowne, DO  budesonide-formoterol (SYMBICORT) 160-4.5 MCG/ACT inhaler Inhale 2 puffs into  the lungs 2 (two) times daily. 05/12/14  Yes [name], MD  carvedilol (COREG) 25 MG tablet Take one tablet by mouth twice daily   Yes Deboraha Sprang, MD  fluticasone [facility]) 50 MCG/ACT nasal spray Place 2 sprays into both nostrils daily. 11/28/13  Yes Chelle S Jeffery, PA-C  furosemide (LASIX) 40 MG tablet Take 1 tablet (40 mg total) by mouth daily. 05/12/14  Yes [name], MD  glucose blood test strip Use as instructed 04/09/13  Yes Alferd Apa Lowne, DO  glucose monitoring kit (FREESTYLE) monitoring kit 1 each by Does not apply route as needed for other. 04/09/13  Yes Yvonne R Lowne, DO  Guaifenesin (MUCINEX MAXIMUM STRENGTH) 1200 MG TB12 Take 1 tablet (1,200 mg total) by mouth every 12 (twelve) hours as needed. 11/28/13  Yes Chelle S Jeffery, PA-C  JANUMET XR [phone] MG TB24 TAKE ONE TABLET BY MOUTH AT BEDTIME -- must make office visit & fasting labs 06/01/14  Yes Yvonne R Lowne, DO  losartan (COZAAR) 50 MG tablet Take 1 tablet (50 mg total) by mouth daily. 07/25/13  Yes Burtis Junes, NP  spironolactone (ALDACTONE) 25 MG tablet TAKE ONE-HALF TABLET BY MOUTH DAILY  06/02/14  Yes Deboraha Sprang, MD  levofloxacin (LEVAQUIN) 500 MG tablet Take 1 tablet (500 mg total) by mouth daily. Patient not taking: Reported on 06/05/2014 05/12/14  [name], MD    Allergies: No Known Allergies  History   Social History  . Marital Status: Single    Spouse Name: n/a    Number of Children: 0  . Years of Education: 12th grade   Occupational History  . commercial cleaning ---self employed    Social History Main Topics  . Smoking status: Never Smoker   . Smokeless tobacco: Current User    Types: Chew     Comment: 02/16/2012 smoking cessation materials offered; pt declines  . Alcohol Use: Yes     Comment: socially occasional  . Drug Use: No  . Sexual Activity:    Partners: Female   Other Topics Concern  . Not on file   Social History Narrative   Exercise-- walking    Lives alone.    Brother lives in [place], [place]     Review of Systems: Constitutional: negative for chills, fever, night sweats or weight changes Cardiovascular: negative for chest pain or palpitations Respiratory: negative for hemoptysis, wheezing, or shortness of breath Abdominal: negative for abdominal pain, nausea, vomiting or diarrhea Dermatological: negative for rash Neurologic: negative for headache   Physical Exam: Blood pressure 130/82, pulse 100, temperature 98 F (36.7 C), temperature source Oral, resp. rate 16, height 5' 9.75" (1.772 m), weight 233 lb (105.688 kg), SpO2 89 %., Body mass index is 33.66 kg/(m^2). General: Well developed, well nourished, in no acute distress. Head: Normocephalic, atraumatic, eyes without discharge, sclera non-icteric, nares are congested. Bilateral auditory canals clear, TM's are without perforation, pearly grey with reflective cone of light bilaterally. Serous effusion bilaterally behind TM's. Maxillary sinus TTP. Oral cavity moist, dentition normal. Posterior pharynx with post nasal drip and mild erythema. No peritonsillar abscess or tonsillar exudate. Neck: Supple. No thyromegaly. Full ROM. No lymphadenopathy. Lungs: Clear bilaterally to auscultation without wheezes, rales, or rhonchi. Breathing is unlabored.  Heart: RRR with S1 S2. No murmurs, rubs, or gallops appreciated. Msk:  Strength and tone normal for age. Extremities: No clubbing or cyanosis. No edema. Neuro: Alert and oriented X 3. Moves all extremities spontaneously. CNII-XII grossly in tact. Psych:  Responds to questions appropriately with a normal affect.   Labs:   ASSESSMENT AND PLAN:  53 y.o. year old male with sinusitis -Levaquin 500 qd x 10  -Tylenol/Motrin prn -Rest/fluids -RTC precautions -RTC 3-5 days if no improvement  Signed, [name], MD 06/05/2014 10:43 AM

## 2014-06-05 NOTE — Addendum Note (Signed)
Addended by: Ivor Reining on: 06/05/2014 10:49 AM   Modules accepted: Orders

## 2014-06-11 ENCOUNTER — Encounter (HOSPITAL_COMMUNITY): Payer: Self-pay | Admitting: Cardiology

## 2014-06-18 ENCOUNTER — Other Ambulatory Visit: Payer: Self-pay | Admitting: Internal Medicine

## 2014-06-18 ENCOUNTER — Other Ambulatory Visit: Payer: Self-pay | Admitting: Family Medicine

## 2014-07-15 ENCOUNTER — Other Ambulatory Visit: Payer: Self-pay | Admitting: Internal Medicine

## 2014-07-15 ENCOUNTER — Other Ambulatory Visit: Payer: Self-pay | Admitting: Family Medicine

## 2014-07-15 NOTE — Telephone Encounter (Signed)
Rx sent for 30 days with no refills.  Patient scheduled for apt on 07/17/14.    eal

## 2014-07-16 ENCOUNTER — Other Ambulatory Visit: Payer: Self-pay

## 2014-07-16 MED ORDER — SPIRONOLACTONE 25 MG PO TABS: 12.5000 mg | ORAL_TABLET | Freq: Every day | ORAL | Status: DC

## 2014-07-16 MED ORDER — CARVEDILOL 25 MG PO TABS: 25.0000 mg | ORAL_TABLET | Freq: Two times a day (BID) | ORAL | Status: DC

## 2014-07-17 ENCOUNTER — Inpatient Hospital Stay (HOSPITAL_BASED_OUTPATIENT_CLINIC_OR_DEPARTMENT_OTHER)
Admission: EM | Admit: 2014-07-17 | Discharge: 2014-07-22 | DRG: 246 | Disposition: A | Discharge status: Home / Self Care | Payer: BLUE CROSS/BLUE SHIELD | Attending: Internal Medicine | Admitting: Internal Medicine

## 2014-07-17 ENCOUNTER — Emergency Department (HOSPITAL_BASED_OUTPATIENT_CLINIC_OR_DEPARTMENT_OTHER): Payer: BLUE CROSS/BLUE SHIELD

## 2014-07-17 ENCOUNTER — Ambulatory Visit (INDEPENDENT_AMBULATORY_CARE_PROVIDER_SITE_OTHER): Payer: BLUE CROSS/BLUE SHIELD | Admitting: Family Medicine

## 2014-07-17 ENCOUNTER — Encounter (HOSPITAL_BASED_OUTPATIENT_CLINIC_OR_DEPARTMENT_OTHER): Payer: Self-pay | Admitting: Emergency Medicine

## 2014-07-17 VITALS — BP 160/100 | HR 115 | Temp 98.3°F | Wt 228.8 lb

## 2014-07-17 DIAGNOSIS — N182 Chronic kidney disease, stage 2 (mild): Secondary | ICD-10-CM | POA: Diagnosis not present

## 2014-07-17 DIAGNOSIS — R0902 Hypoxemia: Secondary | ICD-10-CM | POA: Insufficient documentation

## 2014-07-17 DIAGNOSIS — Z7951 Long term (current) use of inhaled steroids: Secondary | ICD-10-CM | POA: Diagnosis not present

## 2014-07-17 DIAGNOSIS — N179 Acute kidney failure, unspecified: Secondary | ICD-10-CM | POA: Diagnosis not present

## 2014-07-17 DIAGNOSIS — J45909 Unspecified asthma, uncomplicated: Secondary | ICD-10-CM | POA: Diagnosis present

## 2014-07-17 DIAGNOSIS — Z955 Presence of coronary angioplasty implant and graft: Secondary | ICD-10-CM

## 2014-07-17 DIAGNOSIS — I214 Non-ST elevation (NSTEMI) myocardial infarction: Secondary | ICD-10-CM | POA: Diagnosis present

## 2014-07-17 DIAGNOSIS — I5041 Acute combined systolic (congestive) and diastolic (congestive) heart failure: Principal | ICD-10-CM | POA: Diagnosis present

## 2014-07-17 DIAGNOSIS — I10 Essential (primary) hypertension: Secondary | ICD-10-CM | POA: Diagnosis not present

## 2014-07-17 DIAGNOSIS — J9601 Acute respiratory failure with hypoxia: Secondary | ICD-10-CM | POA: Diagnosis not present

## 2014-07-17 DIAGNOSIS — N183 Chronic kidney disease, stage 3 (moderate): Secondary | ICD-10-CM | POA: Diagnosis present

## 2014-07-17 DIAGNOSIS — I472 Ventricular tachycardia, unspecified: Secondary | ICD-10-CM

## 2014-07-17 DIAGNOSIS — I2510 Atherosclerotic heart disease of native coronary artery without angina pectoris: Secondary | ICD-10-CM | POA: Diagnosis present

## 2014-07-17 DIAGNOSIS — I2582 Chronic total occlusion of coronary artery: Secondary | ICD-10-CM | POA: Diagnosis present

## 2014-07-17 DIAGNOSIS — R0602 Shortness of breath: Secondary | ICD-10-CM | POA: Diagnosis present

## 2014-07-17 DIAGNOSIS — Z7982 Long term (current) use of aspirin: Secondary | ICD-10-CM

## 2014-07-17 DIAGNOSIS — R791 Abnormal coagulation profile: Secondary | ICD-10-CM

## 2014-07-17 DIAGNOSIS — I129 Hypertensive chronic kidney disease with stage 1 through stage 4 chronic kidney disease, or unspecified chronic kidney disease: Secondary | ICD-10-CM | POA: Diagnosis present

## 2014-07-17 DIAGNOSIS — R748 Abnormal levels of other serum enzymes: Secondary | ICD-10-CM | POA: Diagnosis present

## 2014-07-17 DIAGNOSIS — E782 Mixed hyperlipidemia: Secondary | ICD-10-CM | POA: Diagnosis present

## 2014-07-17 DIAGNOSIS — E1165 Type 2 diabetes mellitus with hyperglycemia: Secondary | ICD-10-CM | POA: Diagnosis present

## 2014-07-17 DIAGNOSIS — R7989 Other specified abnormal findings of blood chemistry: Secondary | ICD-10-CM | POA: Diagnosis not present

## 2014-07-17 DIAGNOSIS — R000 Tachycardia, unspecified: Secondary | ICD-10-CM

## 2014-07-17 DIAGNOSIS — G4733 Obstructive sleep apnea (adult) (pediatric): Secondary | ICD-10-CM | POA: Diagnosis present

## 2014-07-17 DIAGNOSIS — Z79899 Other long term (current) drug therapy: Secondary | ICD-10-CM

## 2014-07-17 DIAGNOSIS — I161 Hypertensive emergency: Secondary | ICD-10-CM | POA: Diagnosis present

## 2014-07-17 DIAGNOSIS — R079 Chest pain, unspecified: Secondary | ICD-10-CM

## 2014-07-17 DIAGNOSIS — N189 Chronic kidney disease, unspecified: Secondary | ICD-10-CM | POA: Diagnosis present

## 2014-07-17 DIAGNOSIS — I25810 Atherosclerosis of coronary artery bypass graft(s) without angina pectoris: Secondary | ICD-10-CM

## 2014-07-17 DIAGNOSIS — R778 Other specified abnormalities of plasma proteins: Secondary | ICD-10-CM | POA: Diagnosis present

## 2014-07-17 DIAGNOSIS — I255 Ischemic cardiomyopathy: Secondary | ICD-10-CM | POA: Diagnosis present

## 2014-07-17 DIAGNOSIS — IMO0001 Reserved for inherently not codable concepts without codable children: Secondary | ICD-10-CM | POA: Diagnosis present

## 2014-07-17 LAB — CBC WITH DIFFERENTIAL/PLATELET
BASOS PCT: 0 % (ref 0–1)
Basophils Absolute: 0 10*3/uL (ref 0.0–0.1)
EOS ABS: 0.1 10*3/uL (ref 0.0–0.7)
Eosinophils Relative: 1 % (ref 0–5)
HEMATOCRIT: 44.6 % (ref 39.0–52.0)
Hemoglobin: 14.9 g/dL (ref 13.0–17.0)
Lymphocytes Relative: 17 % (ref 12–46)
Lymphs Abs: 1.3 10*3/uL (ref 0.7–4.0)
MCH: 35.3 pg — ABNORMAL HIGH (ref 26.0–34.0)
MCHC: 33.4 g/dL (ref 30.0–36.0)
MCV: 105.7 fL — ABNORMAL HIGH (ref 78.0–100.0)
MONOS PCT: 8 % (ref 3–12)
Monocytes Absolute: 0.6 10*3/uL (ref 0.1–1.0)
NEUTROS PCT: 74 % (ref 43–77)
Neutro Abs: 5.4 10*3/uL (ref 1.7–7.7)
Platelets: 254 10*3/uL (ref 150–400)
RBC: 4.22 MIL/uL (ref 4.22–5.81)
RDW: 13.3 % (ref 11.5–15.5)
WBC: 7.3 10*3/uL (ref 4.0–10.5)

## 2014-07-17 LAB — MAGNESIUM: MAGNESIUM: 1.9 mg/dL (ref 1.5–2.5)

## 2014-07-17 LAB — I-STAT ARTERIAL BLOOD GAS, ED
Acid-Base Excess: 2 mmol/L (ref 0.0–2.0)
Bicarbonate: 26.6 mEq/L — ABNORMAL HIGH (ref 20.0–24.0)
O2 Saturation: 96 %
Patient temperature: 98.6
TCO2: 28 mmol/L (ref 0–100)
pCO2 arterial: 39.2 mmHg (ref 35.0–45.0)
pH, Arterial: 7.44 (ref 7.350–7.450)
pO2, Arterial: 78 mmHg — ABNORMAL LOW (ref 80.0–100.0)

## 2014-07-17 LAB — TROPONIN I: Troponin I: 0.04 ng/mL — ABNORMAL HIGH (ref <0.031)

## 2014-07-17 LAB — D-DIMER, QUANTITATIVE (NOT AT ARMC): D DIMER QUANT: 0.53 ug{FEU}/mL — AB (ref 0.00–0.48)

## 2014-07-17 LAB — MRSA PCR SCREENING: MRSA by PCR: NEGATIVE

## 2014-07-17 LAB — BRAIN NATRIURETIC PEPTIDE: B Natriuretic Peptide: 422.4 pg/mL — ABNORMAL HIGH (ref 0.0–100.0)

## 2014-07-17 MED ORDER — HEPARIN (PORCINE) IN NACL 100-0.45 UNIT/ML-% IJ SOLN
16.0000 [IU]/kg/h | INTRAMUSCULAR | Status: DC
  Administered 2014-07-17: 16 [IU]/kg/h via INTRAVENOUS
  Filled 2014-07-17: qty 250

## 2014-07-17 MED ORDER — CARVEDILOL 25 MG PO TABS
25.0000 mg | ORAL_TABLET | Freq: Two times a day (BID) | ORAL | Status: DC
  Administered 2014-07-18 (×2): 25 mg via ORAL
  Filled 2014-07-17 (×3): qty 1

## 2014-07-17 MED ORDER — INSULIN ASPART 100 UNIT/ML ~~LOC~~ SOLN
0.0000 [IU] | Freq: Three times a day (TID) | SUBCUTANEOUS | Status: DC
  Administered 2014-07-18: 5 [IU] via SUBCUTANEOUS

## 2014-07-17 MED ORDER — INSULIN ASPART 100 UNIT/ML ~~LOC~~ SOLN
0.0000 [IU] | Freq: Every day | SUBCUTANEOUS | Status: DC
Start: 2014-07-17 — End: 2014-07-18

## 2014-07-17 MED ORDER — HEPARIN BOLUS VIA INFUSION
4000.0000 [IU] | Freq: Once | INTRAVENOUS | Status: AC
  Administered 2014-07-17: 4000 [IU] via INTRAVENOUS
  Filled 2014-07-17: qty 4000

## 2014-07-17 MED ORDER — ALUM & MAG HYDROXIDE-SIMETH 200-200-20 MG/5ML PO SUSP: 30.0000 mL | Freq: Four times a day (QID) | ORAL | Status: DC | PRN

## 2014-07-17 MED ORDER — SODIUM CHLORIDE 0.9 % IV SOLN
INTRAVENOUS | Status: DC
  Administered 2014-07-17: via INTRAVENOUS

## 2014-07-17 MED ORDER — SODIUM CHLORIDE 0.9 % IV SOLN: 1.0000 g | Freq: Once | INTRAVENOUS | Status: DC

## 2014-07-17 MED ORDER — FUROSEMIDE 40 MG PO TABS
40.0000 mg | ORAL_TABLET | Freq: Every day | ORAL | Status: DC
  Filled 2014-07-17: qty 1

## 2014-07-17 MED ORDER — HYDRALAZINE HCL 20 MG/ML IJ SOLN
10.0000 mg | Freq: Once | INTRAMUSCULAR | Status: AC
  Administered 2014-07-17: 10 mg via INTRAVENOUS
  Filled 2014-07-17: qty 1

## 2014-07-17 MED ORDER — SPIRONOLACTONE 12.5 MG HALF TABLET
12.5000 mg | ORAL_TABLET | Freq: Every day | ORAL | Status: DC
  Administered 2014-07-18 – 2014-07-21 (×4): 12.5 mg via ORAL
  Filled 2014-07-17 (×5): qty 1

## 2014-07-17 MED ORDER — ONDANSETRON HCL 4 MG PO TABS: 4.0000 mg | ORAL_TABLET | Freq: Four times a day (QID) | ORAL | Status: DC | PRN

## 2014-07-17 MED ORDER — OXYCODONE HCL 5 MG PO TABS: 5.0000 mg | ORAL_TABLET | ORAL | Status: DC | PRN

## 2014-07-17 MED ORDER — FUROSEMIDE 10 MG/ML IJ SOLN
40.0000 mg | Freq: Once | INTRAMUSCULAR | Status: AC
  Administered 2014-07-17: 40 mg via INTRAVENOUS
  Filled 2014-07-17: qty 4

## 2014-07-17 MED ORDER — HYDROMORPHONE HCL 1 MG/ML IJ SOLN: 0.5000 mg | INTRAMUSCULAR | Status: DC | PRN

## 2014-07-17 MED ORDER — ATORVASTATIN CALCIUM 20 MG PO TABS
20.0000 mg | ORAL_TABLET | Freq: Every day | ORAL | Status: DC
  Administered 2014-07-17 – 2014-07-21 (×5): 20 mg via ORAL
  Filled 2014-07-17 (×6): qty 1

## 2014-07-17 MED ORDER — ASPIRIN 325 MG PO TABS
325.0000 mg | ORAL_TABLET | Freq: Once | ORAL | Status: AC
  Administered 2014-07-17: 325 mg via ORAL
  Filled 2014-07-17: qty 1

## 2014-07-17 MED ORDER — FLUTICASONE PROPIONATE 50 MCG/ACT NA SUSP
2.0000 | Freq: Every day | NASAL | Status: DC
  Administered 2014-07-18 – 2014-07-22 (×5): 2 via NASAL
  Filled 2014-07-17: qty 16

## 2014-07-17 MED ORDER — ASPIRIN 325 MG PO TABS
325.0000 mg | ORAL_TABLET | Freq: Every day | ORAL | Status: DC
  Administered 2014-07-18 – 2014-07-21 (×4): 325 mg via ORAL
  Filled 2014-07-17 (×5): qty 1

## 2014-07-17 MED ORDER — NITROGLYCERIN IN D5W 200-5 MCG/ML-% IV SOLN
0.0000 ug/min | INTRAVENOUS | Status: DC
  Administered 2014-07-17: 5 ug/min via INTRAVENOUS
  Filled 2014-07-17: qty 250

## 2014-07-17 MED ORDER — HEPARIN (PORCINE) IN NACL 100-0.45 UNIT/ML-% IJ SOLN
1700.0000 [IU]/h | INTRAMUSCULAR | Status: DC
  Administered 2014-07-17: 1400 [IU]/h via INTRAVENOUS
  Filled 2014-07-17 (×2): qty 250

## 2014-07-17 MED ORDER — CETYLPYRIDINIUM CHLORIDE 0.05 % MT LIQD
7.0000 mL | Freq: Two times a day (BID) | OROMUCOSAL | Status: DC
  Administered 2014-07-18 – 2014-07-21 (×5): 7 mL via OROMUCOSAL

## 2014-07-17 MED ORDER — ACETAMINOPHEN 650 MG RE SUPP: 650.0000 mg | Freq: Four times a day (QID) | RECTAL | Status: DC | PRN

## 2014-07-17 MED ORDER — ONDANSETRON HCL 4 MG/2ML IJ SOLN: 4.0000 mg | Freq: Four times a day (QID) | INTRAMUSCULAR | Status: DC | PRN

## 2014-07-17 MED ORDER — ACETAMINOPHEN 325 MG PO TABS: 650.0000 mg | ORAL_TABLET | Freq: Four times a day (QID) | ORAL | Status: DC | PRN

## 2014-07-17 MED ORDER — LOSARTAN POTASSIUM 50 MG PO TABS
50.0000 mg | ORAL_TABLET | Freq: Every day | ORAL | Status: DC
  Administered 2014-07-18: 50 mg via ORAL
  Filled 2014-07-17: qty 1

## 2014-07-17 NOTE — ED Provider Notes (Signed)
CSN: [id number]     Arrival date & time 07/17/14  1505 History   First MD Initiated Contact with Patient 07/17/14 1508     Chief Complaint  Patient presents with  . Shortness of Breath     (Consider location/radiation/quality/duration/timing/severity/associated sxs/prior Treatment) HPI Comments: Patient sent from PCPs office today where he presented for a follow-up appointment was found to be tachycardic and hypoxic.  He's states he has had about 1 month of URI type symptoms that did not improve with a 10 day course of Levaquin.  He denies chest pain.  He has had mild shortness of breath.  He has had productive cough with brown/green sputum.  He has not noticed a change in his weight or lower external swelling.  He has not been compliant with his CPAP for some time.  The history is provided by the patient. No language interpreter was used.    Past Medical History  Diagnosis Date  . Coronary artery disease     drug eluting stent RCA 2005-EF 30% at that time - s/p CABG x 4  . Cardiomyopathy     alcohol use related  . Asthma   . Hypertension   . Urinary incontinence   . Hyperlipidemia   . History of hypogonadism   . Pneumonia 02/16/2012    "today makes third time in my life"  . OSA on CPAP 02/16/2012    "wear mask sometimes"  . H/O hiatal hernia   . GERD (gastroesophageal reflux disease)   . Migraines 02/16/2012    "used to"  . Anxiety   . Diabetes mellitus type II     Type 2 NIDDM x 7 years  . History of kidney stones 06/2012  . Atrial fibrillation-postoperative 11/28/2012   Past Surgical History  Procedure Laterality Date  . Tonsillectomy  ~ 1970  . Coronary angioplasty with stent placement  ~ 2003  . Refractive surgery  1990's    bilaterally  . Coronary artery bypass graft N/A 08/15/2012    Procedure: CORONARY ARTERY BYPASS GRAFTING (CABG);  Surgeon: Ivin Poot, MD;  Location: [facility];  Service: Open Heart Surgery;  Laterality: N/A;  Coronary Artery Bypass Grafting Times  Four Using Left Internal Mammary Artery and Right Saphenous Leg Vein Harvested Endoscopically  . Left heart catheterization with coronary angiogram N/A 08/08/2012    Procedure: LEFT HEART CATHETERIZATION WITH CORONARY ANGIOGRAM;  Surgeon: Peter M Martinique, MD;  [facility] C[facility] LAB;  Service: Cardiovascular;  Laterality: N/A;   Family History  Problem Relation Age of Onset  . Hypertension Mother   . Diabetes Mother   . Hypertension Father   . Diabetes Father   . Hyperlipidemia Father    History  Substance Use Topics  . Smoking status: Never Smoker   . Smokeless tobacco: Current User    Types: Chew     Comment: 02/16/2012 smoking cessation materials offered; pt declines  . Alcohol Use: Yes     Comment: socially occasional    Review of Systems  Constitutional: Negative for fever, activity change, appetite change and fatigue.  HENT: Negative for congestion, facial swelling, rhinorrhea and trouble swallowing.   Eyes: Negative for photophobia and pain.  Respiratory: Positive for cough and shortness of breath. Negative for chest tightness.   Cardiovascular: Negative for chest pain and leg swelling.  Gastrointestinal: Negative for nausea, vomiting, abdominal pain, diarrhea and constipation.  Endocrine: Negative for polydipsia and polyuria.  Genitourinary: Negative for dysuria, urgency, decreased urine volume and difficulty urinating.  Musculoskeletal: Negative for back pain and gait problem.  Skin: Negative for color change, rash and wound.  Allergic/Immunologic: Negative for immunocompromised state.  Neurological: Negative for dizziness, facial asymmetry, speech difficulty, weakness, numbness and headaches.  Psychiatric/Behavioral: Negative for confusion, decreased concentration and agitation.      Allergies  Review of patient's allergies indicates no known allergies.  Home Medications   Prior to Admission medications   Medication Sig Start Date End Date Taking? Authorizing  Provider  aspirin EC 81 MG tablet Take 1 tablet (81 mg total) by mouth daily. 11/28/12   Deboraha Sprang, MD  atorvastatin (LIPITOR) 20 MG tablet Take 1 tablet (20 mg total) by mouth daily at 6 PM. Keep upcoming appointment. 07/15/14   Rosalita Chessman, DO  budesonide-formoterol (SYMBICORT) 160-4.5 MCG/ACT inhaler Inhale 2 puffs into the lungs 2 (two) times daily. 05/12/14   [name], MD  carvedilol (COREG) 25 MG tablet Take 1 tablet (25 mg total) by mouth 2 (two) times daily. 07/16/14   Deboraha Sprang, MD  fluticasone [facility]) 50 MCG/ACT nasal spray Place 2 sprays into both nostrils daily. 11/28/13   Chelle S Jeffery, PA-C  furosemide (LASIX) 40 MG tablet Take 1 tablet (40 mg total) by mouth daily. 05/12/14   [name], MD  glucose blood test strip Use as instructed 04/09/13   Rosalita Chessman, DO  glucose monitoring kit (FREESTYLE) monitoring kit 1 each by Does not apply route as needed for other. 04/09/13   Alferd Apa Lowne, DO  Guaifenesin (MUCINEX MAXIMUM STRENGTH) 1200 MG TB12 Take 1 tablet (1,200 mg total) by mouth every 12 (twelve) hours as needed. 11/28/13   Chelle S Jeffery, PA-C  JANUMET XR [phone] MG TB24 TAKE ONE TABLET BY MOUTH AT BEDTIME  07/15/14   Rosalita Chessman, DO  levofloxacin (LEVAQUIN) 500 MG tablet Take 1 tablet (500 mg total) by mouth daily. Patient not taking: Reported on 06/05/2014 05/12/14   [name], MD  losartan (COZAAR) 50 MG tablet Take 1 tablet (50 mg total) by mouth daily. 07/25/13   Burtis Junes, NP  spironolactone (ALDACTONE) 25 MG tablet Take 0.5 tablets (12.5 mg total) by mouth daily. 07/16/14   Deboraha Sprang, MD   BP 176/121 mmHg  Pulse 110  Temp(Src) 98.8 F (37.1 C) (Oral)  Resp 24  Ht _0  (1.778 m)  Wt 220 lb (99.791 kg)  BMI 31.57 kg/m2  SpO2 96% Physical Exam  Constitutional: He is oriented to person, place, and time. He appears well-developed and well-nourished. No distress.  HENT:  Head: Normocephalic and atraumatic.  Mouth/Throat:  No oropharyngeal exudate.  Eyes: Pupils are equal, round, and reactive to light.  Neck: Normal range of motion. Neck supple.  Cardiovascular: Regular rhythm and normal heart sounds.  Tachycardia present.  Exam reveals no gallop and no friction rub.   No murmur heard. Pulmonary/Chest: Effort normal and breath sounds normal. Tachypnea noted. No respiratory distress. He has no wheezes. He has no rales.  Abdominal: Soft. Bowel sounds are normal. He exhibits no distension and no mass. There is no tenderness. There is no rebound and no guarding.  Musculoskeletal: Normal range of motion. He exhibits no edema or tenderness.  Neurological: He is alert and oriented to person, place, and time.  Skin: Skin is warm and dry.  Psychiatric: He has a normal mood and affect.    ED Course  Procedures (including critical care time) Labs Review Labs Reviewed  CBC WITH DIFFERENTIAL - Abnormal; Notable  for the following:    MCV 105.7 (*)    MCH 35.3 (*)    All other components within normal limits  BRAIN NATRIURETIC PEPTIDE - Abnormal; Notable for the following:    B Natriuretic Peptide 422.4 (*)    All other components within normal limits  TROPONIN I - Abnormal; Notable for the following:    Troponin I 0.04 (*)    All other components within normal limits  D-DIMER, QUANTITATIVE - Abnormal; Notable for the following:    D-Dimer, Quant 0.53 (*)    All other components within normal limits  I-STAT ARTERIAL BLOOD GAS, ED - Abnormal; Notable for the following:    pO2, Arterial 78.0 (*)    Bicarbonate 26.6 (*)    All other components within normal limits  MAGNESIUM  BLOOD GAS, ARTERIAL    Imaging Review Dg Chest 2 View  07/17/2014   CLINICAL DATA:  Acute onset of cough, wheezing and hypoxemia. Current history of asthma. Prior CABG. Obstructive sleep apnea requiring CPAP.  EXAM: CHEST  2 VIEW  COMPARISON:  06/13/2013 dating back to 02/16/2012.  FINDINGS: Sternotomy for CABG. Cardiac silhouette mildly  enlarged. Thoracic aorta mildly atherosclerotic, unchanged. Hilar and mediastinal contours otherwise unremarkable. Pulmonary venous hypertension with minimal interstitial pulmonary edema as evidenced by scattered Kerley B-lines. Small bilateral pleural effusions suspected. No confluent airspace consolidation. No pneumothorax. Visualized bony thorax intact.  IMPRESSION: Minimal CHF, with stable mild cardiomegaly and minimal interstitial pulmonary edema. Small bilateral pleural effusions.   Electronically Signed   By: Evangeline Dakin M.D.   On: 07/17/2014 16:08     EKG Interpretation   Date/Time:  Friday July 17 2014 15:29:41 EST Ventricular Rate:  111 PR Interval:  164 QRS Duration: 100 QT Interval:  342 QTC Calculation: 465 R Axis:   -130 Text Interpretation:  Sinus tachycardia Right superior axis deviation  Nonspecific T wave abnormality Abnormal ECG Prior EKG with LAD Confirmed  by [place] ([id number]) on 07/17/2014 3:37:49 PM      CRITICAL CARE Performed by: Ernestina Patches, E Total critical care time: 42mns Critical care time was exclusive of separately billable procedures and treating other patients. Critical care was necessary to treat or prevent imminent or life-threatening deterioration. Critical care was time spent personally by me on the following activities: development of treatment plan with patient and/or surrogate as well as nursing, discussions with consultants, evaluation of patient's response to treatment, examination of patient, obtaining history from patient or surrogate, ordering and performing treatments and interventions, ordering and review of laboratory studies, ordering and review of radiographic studies, pulse oximetry and re-evaluation of patient's condition.   MDM   Final diagnoses:  Tachycardia  Hypoxia  V tach  Elevated troponin  Elevated d-dimer    Pt is a 54y.o. male with Pmhx as above who presents from PCPs office where he presented for  a follow-up appointment today and was found to be tachycardic and hypoxic.  Patient states that he has had about 1 month of URI symptoms were he will have a productive cough with frontal and maxillary sinus pressure at night.  He was put on 10 days of Levaquin and December and symptoms did not improve.  On physical exam heart rate is 1 04/02/2014, respirations are 20.  O2 sats are 88% on room air.  Blood pressure slightly elevated at 165/100.  Lungs are clear, HEENT exam is normal.  He has minimal bilateral lower extremity edema.   4:15 PM pt has  had about a 45 beat run of Vtach, witnessed by nursing at which time he was tachypneic.  CXR w/ minimal CHF, BNP 422. Trop and D-dimer mildly elevated. I spoke w/ CCM who did not feel he required ICU level care, spoke w/ Bensimhon w/ cardiology who did not feel he required heparin, but does feel he requires cardiac eval. [facility] will accept under stepdown status. At this point eitiology on clinical picture unclear, CHF vs NSTEMI, vs PE.      Ernestina Patches, MD 07/17/14 [id number]

## 2014-07-17 NOTE — Progress Notes (Signed)
ANTICOAGULATION CONSULT NOTE - Initial Consult  Pharmacy Consult for heparin Indication: chest pain/ACS  No Known Allergies  Patient Measurements: Height: _0  (177.8 cm) Weight: 223 lb 5.2 oz (101.3 kg) IBW/kg (Calculated) : 73 Heparin Dosing Weight: 95kg  Vital Signs: Temp: 98.4 F (36.9 C) (01/15 2115) Temp Source: Oral (01/15 2115) BP: 197/115 mmHg (01/15 2237) Pulse Rate: 110 (01/15 2237)  Labs:  Recent Labs  07/17/14 1545  HGB 14.9  HCT 44.6  PLT 254  TROPONINI 0.04*     Medical History: Past Medical History  Diagnosis Date  . Coronary artery disease     drug eluting stent RCA 2005-EF 30% at that time - s/p CABG x 4  . Cardiomyopathy     alcohol use related  . Asthma   . Hypertension   . Urinary incontinence   . Hyperlipidemia   . History of hypogonadism   . Pneumonia 02/16/2012    "today makes third time in my life"  . OSA on CPAP 02/16/2012    "wear mask sometimes"  . H/O hiatal hernia   . GERD (gastroesophageal reflux disease)   . Migraines 02/16/2012    "used to"  . Anxiety   . Diabetes mellitus type II     Type 2 NIDDM x 7 years  . History of kidney stones 06/2012  . Atrial fibrillation-postoperative 11/28/2012    Medications:  Prescriptions prior to admission  Medication Sig Dispense Refill Last Dose  . aspirin EC 81 MG tablet Take 1 tablet (81 mg total) by mouth daily.   Taking  . atorvastatin (LIPITOR) 20 MG tablet Take 1 tablet (20 mg total) by mouth daily at 6 PM. Keep upcoming appointment. 30 tablet 0   . budesonide-formoterol (SYMBICORT) 160-4.5 MCG/ACT inhaler Inhale 2 puffs into the lungs 2 (two) times daily. 1 Inhaler 3 Taking  . carvedilol (COREG) 25 MG tablet Take 1 tablet (25 mg total) by mouth 2 (two) times daily. 14 tablet 0   . fluticasone (FLONASE) 50 MCG/ACT nasal spray Place 2 sprays into both nostrils daily. 16 g 12 Taking  . furosemide (LASIX) 40 MG tablet Take 1 tablet (40 mg total) by mouth daily. 90 tablet 3 Taking   . glucose blood test strip Use as instructed 100 each 12 Taking  . glucose monitoring kit (FREESTYLE) monitoring kit 1 each by Does not apply route as needed for other. 1 each 0 Taking  . Guaifenesin (MUCINEX MAXIMUM STRENGTH) 1200 MG TB12 Take 1 tablet (1,200 mg total) by mouth every 12 (twelve) hours as needed. 14 tablet 1 Taking  . JANUMET XR [phone] MG TB24 TAKE ONE TABLET BY MOUTH AT BEDTIME  30 tablet 0   . levofloxacin (LEVAQUIN) 500 MG tablet Take 1 tablet (500 mg total) by mouth daily. (Patient not taking: Reported on 06/05/2014) 7 tablet 0 Not Taking  . losartan (COZAAR) 50 MG tablet Take 1 tablet (50 mg total) by mouth daily. 90 tablet 3 Taking  . spironolactone (ALDACTONE) 25 MG tablet Take 0.5 tablets (12.5 mg total) by mouth daily. 7 tablet 0    Scheduled:  . [START ON 07/18/2014] antiseptic oral rinse  7 mL Mouth Rinse BID  . [START ON 07/18/2014] aspirin  325 mg Oral Daily  . atorvastatin  20 mg Oral q1800  . [START ON 07/18/2014] carvedilol  25 mg Oral BID WC  . [START ON 07/18/2014] fluticasone  2 spray Each Nare Daily  . [START ON 07/18/2014] furosemide  40 mg Oral Daily  .  insulin aspart  0-5 Units Subcutaneous QHS  . [START ON 07/18/2014] insulin aspart  0-9 Units Subcutaneous TID WC  . [START ON 07/18/2014] losartan  50 mg Oral Daily  . [START ON 07/18/2014] spironolactone  12.5 mg Oral Daily   Infusions:  . sodium chloride    . nitroGLYCERIN      Assessment: 54yo male c/o SOB, sent to ED for evaluation, had witnessed 45-beat run of Vtach in ED, initial troponin mildly elevated, awaiting cards eval, to begin heparin.  Goal of Therapy:  Heparin level 0.3-0.7 units/ml Monitor platelets by anticoagulation protocol: Yes   Plan:  Will give heparin 4000 units IV bolus x1 followed by gtt at 1400 units/hr and monitor heparin levels and CBC.  Wynona Neat, PharmD, BCPS  07/17/2014,10:58 PM

## 2014-07-17 NOTE — ED Notes (Signed)
States has a sinus problem.  Also out of meds   Was upstairs to see MD and sent down to ED

## 2014-07-17 NOTE — ED Notes (Signed)
Pt. Is hooked to the [facility] due to recent cardiac V tach event.  Pt. Is in no distress and has no noted chest pain .

## 2014-07-17 NOTE — Progress Notes (Signed)
Subjective:    Patient ID: Vincent Chandler, male    DOB: 07/16/1960, 54 y.o.   MRN: [id number]  HPI    Review of Systems     Objective:   Physical Exam        Assessment & Plan:

## 2014-07-17 NOTE — Consult Note (Signed)
CARDIOLOGY INPATIENT CONSULTATION NOTE  Patient ID: Shandon Burlingame MRN: [id number], DOB/AGE: 1961-06-12   Admit date: 07/17/2014   Primary Physician: Garnet Koyanagi, DO Primary Cardiologist: Virl Axe MD  Reason for Consult:   VT  Requesting Physician: [facility], C MD  HPI: This is a 54 y.o. male with known history of CAD s/p 4 vessel CABG and risk factors (hypertension, diabetes mellitus, obesity, OSA and hyperlipidemia), post cabg afib, anxiety, kidney stones, on CPAP, asthma, pneumonia x 3 who presented with acute respiratory distress and VT. Patient is admitted under [facility] hospital[facility] service and is being managed for acute respiratory failure thought to be due to CHF.    Patient was in his usual state of health when he developed respiratory distress at home 1 day ago. He went to his PCP today where due to his hypoxia on the pulse ox in 80s he was sent to [facility] ED for evaluation. There he was found to have VT with 45 beats which he is not aware of and thus was transferred to [facility] for further care. He presented today with mild CHF and had a 45 beat VT in the ED witnessed by the RN. Patient did not have syncope associated with it. Currently he has dyspnea, paroxysmal nocturnal dyspnea, orthopnea but no leg swelling present. He also has elevated BP.  He has not been compliant with the salt and fluid restriction.  Of note, he had an episode of URI 1 month ago and had some productive couhg associated with it. He was given levaquin which did not improve his symptoms.    Problem List: Past Medical History  Diagnosis Date  . Coronary artery disease     drug eluting stent RCA 2005-EF 30% at that time - s/p CABG x 4  . Cardiomyopathy     alcohol use related  . Asthma   . Hypertension   . Urinary incontinence   . Hyperlipidemia   . History of hypogonadism   . Pneumonia 02/16/2012    "today makes third time in my life"  . OSA on CPAP 02/16/2012   "wear mask sometimes"  . H/O hiatal hernia   . GERD (gastroesophageal reflux disease)   . Migraines 02/16/2012    "used to"  . Anxiety   . Diabetes mellitus type II     Type 2 NIDDM x 7 years  . History of kidney stones 06/2012  . Atrial fibrillation-postoperative 11/28/2012    Past Surgical History  Procedure Laterality Date  . Tonsillectomy  ~ 1970  . Coronary angioplasty with stent placement  ~ 2003  . Refractive surgery  1990's    bilaterally  . Coronary artery bypass graft N/A 08/15/2012    Procedure: CORONARY ARTERY BYPASS GRAFTING (CABG);  Surgeon: Ivin Poot, MD;  Location: [place];  Service: Open Heart Surgery;  Laterality: N/A;  Coronary Artery Bypass Grafting Times Four Using Left Internal Mammary Artery and Right Saphenous Leg Vein Harvested Endoscopically  . Left heart catheterization with coronary angiogram N/A 08/08/2012    Procedure: LEFT HEART CATHETERIZATION WITH CORONARY ANGIOGRAM;  Surgeon: Peter M Martinique, MD;  Location: [facility] CATH LAB;  Service: Cardiovascular;  Laterality: N/A;     Allergies: No Known Allergies   Home Medications Current Facility-Administered Medications  Medication Dose Route Frequency Provider Last Rate Last Dose  . 0.9 %  sodium chloride infusion   Intravenous Continuous Theressa Millard, MD 50 mL/hr at 07/17/14 2334    . acetaminophen (  TYLENOL) tablet 650 mg  650 mg Oral Q6H PRN Theressa Millard, MD       Or  . acetaminophen (TYLENOL) suppository 650 mg  650 mg Rectal Q6H PRN Theressa Millard, MD      . alum & mag hydroxide-simeth (MAALOX/MYLANTA) 200-200-20 MG/5ML suspension 30 mL  30 mL Oral Q6H PRN Theressa Millard, MD      . antiseptic oral rinse (CPC / CETYLPYRIDINIUM CHLORIDE 0.05%) solution 7 mL  7 mL Mouth Rinse BID Costin Karlyne Greenspan, MD      . aspirin tablet 325 mg  325 mg Oral Daily Theressa Millard, MD      . atorvastatin (LIPITOR) tablet 20 mg  20 mg Oral q1800 Theressa Millard, MD   20 mg at 07/17/14 2345  . carvedilol  (COREG) tablet 25 mg  25 mg Oral BID WC Harvette Evonnie Dawes, MD      . fluticasone (FLONASE) 50 MCG/ACT nasal spray 2 spray  2 spray Each Nare Daily Harvette Evonnie Dawes, MD      . furosemide (LASIX) tablet 40 mg  40 mg Oral Daily Harvette Evonnie Dawes, MD      . heparin ADULT infusion 100 units/mL (25000 units/250 mL)  1,400 Units/hr Intravenous Continuous Rogue Bussing, [facility] 14 mL/hr at 07/17/14 2325 1,400 Units/hr at 07/17/14 2325  . HYDROmorphone (DILAUDID) injection 0.5-1 mg  0.5-1 mg Intravenous Q3H PRN Theressa Millard, MD      . insulin aspart (novoLOG) injection 0-5 Units  0-5 Units Subcutaneous QHS Theressa Millard, MD   0 Units at 07/17/14 2300  . insulin aspart (novoLOG) injection 0-9 Units  0-9 Units Subcutaneous TID WC Harvette Evonnie Dawes, MD      . losartan (COZAAR) tablet 50 mg  50 mg Oral Daily Harvette Evonnie Dawes, MD      . nitroGLYCERIN 50 mg in dextrose 5 % 250 mL (0.2 mg/mL) infusion  5 mcg/min Intravenous Continuous Theressa Millard, MD 1.5 mL/hr at 07/17/14 2323 5 mcg/min at 07/17/14 2323  . ondansetron (ZOFRAN) tablet 4 mg  4 mg Oral Q6H PRN Theressa Millard, MD       Or  . ondansetron (ZOFRAN) injection 4 mg  4 mg Intravenous Q6H PRN Theressa Millard, MD      . oxyCODONE (Oxy IR/ROXICODONE) immediate release tablet 5 mg  5 mg Oral Q4H PRN Theressa Millard, MD      . spironolactone (ALDACTONE) tablet 12.5 mg  12.5 mg Oral Daily Theressa Millard, MD         Family History  Problem Relation Age of Onset  . Hypertension Mother   . Diabetes Mother   . Hypertension Father   . Diabetes Father   . Hyperlipidemia Father      History   Social History  . Marital Status: Single    Spouse Name: n/a    Number of Children: 0  . Years of Education: 12th grade   Occupational History  . commercial cleaning ---self employed    Social History Main Topics  . Smoking status: Never Smoker   . Smokeless tobacco: Current User    Types: Chew     Comment: 02/16/2012  smoking cessation materials offered; pt declines  . Alcohol Use: Yes     Comment: socially occasional  . Drug Use: No  . Sexual Activity:    Partners: Female   Other Topics Concern  . Not on file   Social History  Narrative   Exercise-- walking    Lives alone.   Brother lives in [place], [place]     Review of Systems: General: negative for chills, fever, night sweats or weight changes.  Cardiovascular: chest pain, dyspnea and dyspnea on exertion, leg edema, orthopnea, palpitations, paroxysmal nocturnal dyspnea   Dermatological: negative for rash Respiratory: negative for cough or wheezing Urologic: negative for hematuria Abdominal: negative for nausea, vomiting, diarrhea, bright red blood per rectum, melena, or hematemesis Neurologic: negative for visual changes, syncope, or dizziness  Physical Exam: Vitals: BP 115/70 mmHg  Pulse 109  Temp(Src) 98.4 F (36.9 C) (Oral)  Resp 20  Ht _0  (1.778 m)  Wt 101.3 kg (223 lb 5.2 oz)  BMI 32.04 kg/m2  SpO2 97% General: not in acute distress Neck: JVP 20 cm up to angle of jaw, neck supple Heart: regular rate and rhythm, S1, S2, holosystolic murmur present at PMI, PMI shifted laterally Lungs; no crackles  GI: non tender, non distended, bowel sounds present Extremities: no edema Neuro: AAO x 3  Psych: normal affect, no anxiety   Labs:   Results for orders placed or performed during the hospital encounter of 07/17/14 (from the past 24 hour(s))  CBC with Differential     Status: Abnormal   Collection Time: 07/17/14  3:45 PM  Result Value Ref Range   WBC 7.3 4.0 - 10.5 K/uL   RBC 4.22 4.22 - 5.81 MIL/uL   Hemoglobin 14.9 13.0 - 17.0 g/dL   HCT 44.6 39.0 - 52.0 %   MCV 105.7 (H) 78.0 - 100.0 fL   MCH 35.3 (H) 26.0 - 34.0 pg   MCHC 33.4 30.0 - 36.0 g/dL   RDW 13.3 11.5 - 15.5 %   Platelets 254 150 - 400 K/uL   Neutrophils Relative % 74 43 - 77 %   Neutro Abs 5.4 1.7 - 7.7 K/uL   Lymphocytes Relative 17 12 - 46 %   Lymphs  Abs 1.3 0.7 - 4.0 K/uL   Monocytes Relative 8 3 - 12 %   Monocytes Absolute 0.6 0.1 - 1.0 K/uL   Eosinophils Relative 1 0 - 5 %   Eosinophils Absolute 0.1 0.0 - 0.7 K/uL   Basophils Relative 0 0 - 1 %   Basophils Absolute 0.0 0.0 - 0.1 K/uL  Brain natriuretic peptide     Status: Abnormal   Collection Time: 07/17/14  3:45 PM  Result Value Ref Range   B Natriuretic Peptide 422.4 (H) 0.0 - 100.0 pg/mL  Troponin I     Status: Abnormal   Collection Time: 07/17/14  3:45 PM  Result Value Ref Range   Troponin I 0.04 (H) <0.031 ng/mL  D-dimer, quantitative     Status: Abnormal   Collection Time: 07/17/14  3:45 PM  Result Value Ref Range   D-Dimer, Quant 0.53 (H) 0.00 - 0.48 ug/mL-FEU  Magnesium     Status: None   Collection Time: 07/17/14  3:45 PM  Result Value Ref Range   Magnesium 1.9 1.5 - 2.5 mg/dL  Blood gas, arterial *Canceled*     Status: None ()   Collection Time: 07/17/14  3:53 PM   Narrative   LIS Cancel (ORR/DE = Data Error)  I-Stat arterial blood gas, ED     Status: Abnormal   Collection Time: 07/17/14  4:51 PM  Result Value Ref Range   pH, Arterial 7.440 7.350 - 7.450   pCO2 arterial 39.2 35.0 - 45.0 mmHg   pO2, Arterial 78.0 (L)  80.0 - 100.0 mmHg   Bicarbonate 26.6 (H) 20.0 - 24.0 mEq/L   TCO2 28 0 - 100 mmol/L   O2 Saturation 96.0 %   Acid-Base Excess 2.0 0.0 - 2.0 mmol/L   Patient temperature 98.6 F    Collection site RADIAL, ALLEN'S TEST ACCEPTABLE    Drawn by RT    Sample type ARTERIAL   MRSA PCR Screening     Status: None   Collection Time: 07/17/14  9:05 PM  Result Value Ref Range   MRSA by PCR NEGATIVE NEGATIVE  Glucose, capillary     Status: Abnormal   Collection Time: 07/17/14 11:01 PM  Result Value Ref Range   Glucose-Capillary 187 (H) 70 - 99 mg/dL   Comment 1 Capillary Sample   Troponin I (q 6hr x 3)     Status: Abnormal   Collection Time: 07/17/14 11:07 PM  Result Value Ref Range   Troponin I 0.07 (H) <0.031 ng/mL  Comprehensive metabolic panel      Status: Abnormal   Collection Time: 07/17/14 11:07 PM  Result Value Ref Range   Sodium 138 135 - 145 mmol/L   Potassium 3.9 3.5 - 5.1 mmol/L   Chloride 101 96 - 112 mEq/L   CO2 25 19 - 32 mmol/L   Glucose, Bld 192 (H) 70 - 99 mg/dL   BUN 11 6 - 23 mg/dL   Creatinine, Ser 0.97 0.50 - 1.35 mg/dL   Calcium 9.0 8.4 - 10.5 mg/dL   Total Protein 7.3 6.0 - 8.3 g/dL   Albumin 3.5 3.5 - 5.2 g/dL   AST 24 0 - 37 U/L   ALT 22 0 - 53 U/L   Alkaline Phosphatase 64 39 - 117 U/L   Total Bilirubin 1.4 (H) 0.3 - 1.2 mg/dL   GFR calc non Af Amer >90 >90 mL/min   GFR calc Af Amer >90 >90 mL/min   Anion gap 12 5 - 15     Radiology/Studies: Dg Chest 2 View  07/17/2014   CLINICAL DATA:  Acute onset of cough, wheezing and hypoxemia. Current history of asthma. Prior CABG. Obstructive sleep apnea requiring CPAP.  EXAM: CHEST  2 VIEW  COMPARISON:  06/13/2013 dating back to 02/16/2012.  FINDINGS: Sternotomy for CABG. Cardiac silhouette mildly enlarged. Thoracic aorta mildly atherosclerotic, unchanged. Hilar and mediastinal contours otherwise unremarkable. Pulmonary venous hypertension with minimal interstitial pulmonary edema as evidenced by scattered Kerley B-lines. Small bilateral pleural effusions suspected. No confluent airspace consolidation. No pneumothorax. Visualized bony thorax intact.  IMPRESSION: Minimal CHF, with stable mild cardiomegaly and minimal interstitial pulmonary edema. Small bilateral pleural effusions.   Electronically Signed   By: Evangeline Dakin M.D.   On: 07/17/2014 16:08    EKG: normal sinus rhythm, non specific T wave changes  Echo: 2014, mild LVH, mildly dilated LV, LVEF 40%  Cardiac cath: 2014 high-grade LADstenosis of 90%, chronic occlusion of the RCA, and an 80% stenosis of a OM.  Medical decision making:  Discussed care with the patient Discussed care with the physician on the phone Reviewed labs and imaging personally Reviewed prior records  ASSESSMENT AND PLAN:    This is a 54 y.o. male with known history of CAD s/p 4 vessel CABG and risk factors (hypertension, diabetes mellitus, obesity, OSA and hyperlipidemia), post cabg afib, anxiety, kidney stones, on CPAP, asthma, pneumonia x 3 who presented with acute respiratory distress and VT.   Active Problems: Ventricular tachycardia, asymptomatic  Baseline QRS is narrow, unable to locate the VT  strip Continue coreg If recurrent or symptomatic, will probably need to add antiarrhythmic However for the time being, treat the CHF exacerbation, once euvolemic will consider evaluating for ischemic heart disease with cardiac catheterization  Diurese for now Cycle troponin Possible cath for tomorrow, keep NPO post midnight Re-evaluate for LVEF and if <35%, may need AICD/life vest prior to discharge May risk stratify with EP study Avoid QT prolonging medications such as levaquin/azithromycin  Chronic coronary artery disease, s/p LIMA to dLAD, SVG to Om1, SVG to dRCA Continue aspirin, statin and coreg 25 mg BID Increase lipitor to high dose 40 mg daily  Acute combined systolic and diastolic congestive heart failure Elevated BNP, mild pulmonary edema on the CXR  IV diuresis with lasix  >2 l/day net negative diuresis goal Daily weights, strict I/Os Salt restricted diet 1500 ml/day fluid restriction Obtain repeat echocardiogram  Acute respiratory failure secondary to CHF Treat as above Oxygen as needed Can use bipap prn   Elevated troponin Likely secondary to CHF exacerbation and hypertensive emergency Treat the underlying cause Cycle troponin Repeat EKG if tomorrow morning  Hypertensive emergency Consider using NTG drip  Goal SBP <140 mmHg  Signed, Flossie Dibble, MD MS 07/18/2014, 2:32 AM

## 2014-07-17 NOTE — ED Notes (Signed)
Pt. Resting with eyes closed.

## 2014-07-17 NOTE — H&P (Addendum)
[facility] Admission History and Physical       Vincent Chandler MRN:[id number] DOB: 03/02/1961 DOA: 07/17/2014  Referring physician: EDP PCP: Yvonne Lowne, DO  Specialists:   Chief Complaint: SOB Palpitations  HPI: Vincent Chandler is a 54 y.o. male with a history of CAD, Cardiomyopathy, Asthma, HTN, Hyperlipidemia, DM2, and OSA who presents to the ED with complaints of  SOB and was found to be hypoxemic at his PCP's office to a level of 85%.   He also had a run of V-Tach which was reported as a 45 beat run, and he was sent by EMS for direct admission to the [facility] and Cardiology was consulted.    He reports increased URI symptoms for over 4 weeks after he had inhaled particulate matter on his job which resulted in an unremitting sinus infection and pneumonitis.   He had been seen by his PCP and received antibiotics but his condition worsened.    He repots having chest discomfort and palpitations.    He was a found to have a Positive troponin of 0.04, and a positive D-dimer of 0.53.   Cardiology was consulted to see, and a C-Met was also ordered so that a CTA of the Chest or VQ scan could be ordered to evaluate for a Pulmonary embolism.   He was placed on an V Heparin and an IV Nitroglycerin drip and ASA therapy.     Review of Systems:  Constitutional: No Weight Loss, No Weight Gain, Night Sweats, Fevers, Chills, Dizziness, Fatigue, or Generalized Weakness HEENT: No Headaches, Difficulty Swallowing,Tooth/Dental Problems,Sore Throat,  No Sneezing, Rhinitis, Ear Ache, +Nasal Congestion, or Post Nasal Drip,  Cardio-vascular:  +Chest pain, Orthopnea, PND, Edema in Lower Extremities, Anasarca, Dizziness, Palpitations  Resp: +Dyspnea, No DOE,+Productive Cough, No Non-Productive Cough, No Hemoptysis, No Wheezing.    GI: No Heartburn, Indigestion, Abdominal Pain, Nausea, Vomiting, Diarrhea, Hematemesis, Hematochezia, Melena, Change in Bowel Habits,  Loss of Appetite  GU: No Dysuria, Change in  Color of Urine, No Urgency or Frequency, No Flank pain.  Musculoskeletal: No Joint Pain or Swelling, No Decreased Range of Motion, No Back Pain.  Neurologic: No Syncope, No Seizures, Muscle Weakness, Paresthesia, Vision Disturbance or Loss, No Diplopia, No Vertigo, No Difficulty Walking,  Skin: No Rash or Lesions. Psych: No Change in Mood or Affect, No Depression or Anxiety, No Memory loss, No Confusion, or Hallucinations   Past Medical History  Diagnosis Date  . Coronary artery disease     drug eluting stent RCA 2005-EF 30% at that time - s/p CABG x 4  . Cardiomyopathy     alcohol use related  . Asthma   . Hypertension   . Urinary incontinence   . Hyperlipidemia   . History of hypogonadism   . Pneumonia 02/16/2012    "today makes third time in my life"  . OSA on CPAP 02/16/2012    "wear mask sometimes"  . H/O hiatal hernia   . GERD (gastroesophageal reflux disease)   . Migraines 02/16/2012    "used to"  . Anxiety   . Diabetes mellitus type II     Type 2 NIDDM x 7 years  . History of kidney stones 06/2012  . Atrial fibrillation-postoperative 11/28/2012      Past Surgical History  Procedure Laterality Date  . Tonsillectomy  ~ 1970  . Coronary angioplasty with stent placement  ~ 2003  . Refractive surgery  1990's    bilaterally  . Coronary artery bypass graft N/A 08/15/2012      Procedure: CORONARY ARTERY BYPASS GRAFTING (CABG);  Surgeon: Ivin Poot, MD;  Location: [place];  Service: Open Heart Surgery;  Laterality: N/A;  Coronary Artery Bypass Grafting Times Four Using Left Internal Mammary Artery and Right Saphenous Leg Vein Harvested Endoscopically  . Left heart catheterization with coronary angiogram N/A 08/08/2012    Procedure: LEFT HEART CATHETERIZATION WITH CORONARY ANGIOGRAM;  Surgeon: Peter M Martinique, MD;  Location: [facility] CATH LAB;  Service: Cardiovascular;  Laterality: N/A;       Prior to Admission medications   Medication Sig Start Date End Date Taking? Authorizing  Provider  aspirin EC 81 MG tablet Take 1 tablet (81 mg total) by mouth daily. 11/28/12   Deboraha Sprang, MD  atorvastatin (LIPITOR) 20 MG tablet Take 1 tablet (20 mg total) by mouth daily at 6 PM. Keep upcoming appointment. 07/15/14   Rosalita Chessman, DO  budesonide-formoterol (SYMBICORT) 160-4.5 MCG/ACT inhaler Inhale 2 puffs into the lungs 2 (two) times daily. 05/12/14   [name], MD  carvedilol (COREG) 25 MG tablet Take 1 tablet (25 mg total) by mouth 2 (two) times daily. 07/16/14   Deboraha Sprang, MD  fluticasone [facility]) 50 MCG/ACT nasal spray Place 2 sprays into both nostrils daily. 11/28/13   Chelle S Jeffery, PA-C  furosemide (LASIX) 40 MG tablet Take 1 tablet (40 mg total) by mouth daily. 05/12/14   [name], MD  glucose blood test strip Use as instructed 04/09/13   Rosalita Chessman, DO  glucose monitoring kit (FREESTYLE) monitoring kit 1 each by Does not apply route as needed for other. 04/09/13   Alferd Apa Lowne, DO  Guaifenesin (MUCINEX MAXIMUM STRENGTH) 1200 MG TB12 Take 1 tablet (1,200 mg total) by mouth every 12 (twelve) hours as needed. 11/28/13   Chelle S Jeffery, PA-C  JANUMET XR [phone] MG TB24 TAKE ONE TABLET BY MOUTH AT BEDTIME  07/15/14   Rosalita Chessman, DO  levofloxacin (LEVAQUIN) 500 MG tablet Take 1 tablet (500 mg total) by mouth daily. Patient not taking: Reported on 06/05/2014 05/12/14   [name], MD  losartan (COZAAR) 50 MG tablet Take 1 tablet (50 mg total) by mouth daily. 07/25/13   Burtis Junes, NP  spironolactone (ALDACTONE) 25 MG tablet Take 0.5 tablets (12.5 mg total) by mouth daily. 07/16/14   Deboraha Sprang, MD      No Known Allergies   Social History:  reports that he has never smoked. His smokeless tobacco use includes Chew. He reports that he drinks alcohol. He reports that he does not use illicit drugs.     Family History  Problem Relation Age of Onset  . Hypertension Mother   . Diabetes Mother   . Hypertension Father   . Diabetes  Father   . Hyperlipidemia Father        Physical Exam:  GEN:  Pleasant Obese  54 y.o. Caucasian male examined and in no acute distress; cooperative with exam Filed Vitals:   07/17/14 2145 07/17/14 2200 07/17/14 2230 07/17/14 2237  BP: 189/131 172/137 197/115 197/115  Pulse: 110 114 112 110  Temp:      TempSrc:      Resp: _0 Height:      Weight:      SpO2: 94% 88% 97%    Blood pressure 197/115, pulse 110, temperature 98.4 F (36.9 C), temperature source Oral, resp. rate 20, height 5' 10" (1.778 m), weight 101.3 kg (223 lb 5.2 oz), SpO2 97 %.  PSYCH: He is alert and oriented x4; does not appear anxious does not appear depressed; affect is normal HEENT: Normocephalic and Atraumatic, Mucous membranes pink; PERRLA; EOM intact; Fundi:  Benign;  No scleral icterus, Nares: Patent, Oropharynx: Clear, Fair Dentition,    Neck:  FROM, No Cervical Lymphadenopathy nor Thyromegaly or Carotid Bruit; No JVD; Breasts:: Not examined CHEST WALL: No tenderness CHEST: Normal respiration, clear to auscultation bilaterally HEART: Regular rate and rhythm; no murmurs rubs or gallops BACK: No kyphosis or scoliosis; No CVA tenderness ABDOMEN: Positive Bowel Sounds, Obese, Soft Non-Tender; No Masses, No Organomegaly, No Pannus; No Intertriginous candida. Rectal Exam: Not done EXTREMITIES: No Cyanosis, Clubbing, or Edema; No Ulcerations. Genitalia: not examined PULSES: 2+ and symmetric SKIN: Normal hydration no rash or ulceration CNS:  Alert and Oriented x 4, No Focal Deficits Vascular: pulses palpable throughout    Labs on Admission:  Basic Metabolic Panel:  Recent Labs Lab 07/17/14 1545  MG 1.9   Liver Function Tests: No results for input(s): AST, ALT, ALKPHOS, BILITOT, PROT, ALBUMIN in the last 168 hours. No results for input(s): LIPASE, AMYLASE in the last 168 hours. No results for input(s): AMMONIA in the last 168 hours. CBC:  Recent Labs Lab 07/17/14 1545  WBC 7.3  NEUTROABS  5.4  HGB 14.9  HCT 44.6  MCV 105.7*  PLT 254   Cardiac Enzymes:  Recent Labs Lab 07/17/14 1545  TROPONINI 0.04*    BNP (last 3 results) No results for input(s): PROBNP in the last 8760 hours. CBG: No results for input(s): GLUCAP in the last 168 hours.  Radiological Exams on Admission: Dg Chest 2 View  07/17/2014   CLINICAL DATA:  Acute onset of cough, wheezing and hypoxemia. Current history of asthma. Prior CABG. Obstructive sleep apnea requiring CPAP.  EXAM: CHEST  2 VIEW  COMPARISON:  06/13/2013 dating back to 02/16/2012.  FINDINGS: Sternotomy for CABG. Cardiac silhouette mildly enlarged. Thoracic aorta mildly atherosclerotic, unchanged. Hilar and mediastinal contours otherwise unremarkable. Pulmonary venous hypertension with minimal interstitial pulmonary edema as evidenced by scattered Kerley B-lines. Small bilateral pleural effusions suspected. No confluent airspace consolidation. No pneumothorax. Visualized bony thorax intact.  IMPRESSION: Minimal CHF, with stable mild cardiomegaly and minimal interstitial pulmonary edema. Small bilateral pleural effusions.   Electronically Signed   By: Evangeline Dakin M.D.   On: 07/17/2014 16:08     EKG: Independently reviewed. Sinus Tachycardia at rate of 111    Assessment/Plan:   54 y.o. male with  Principal Problem:   1.   V tach   Resolved   Cardiac Monitoring   Check Electrolytes and TS?H   Cardiology consulted     Active Problems:   2.  NSTEMI/Elevated troponin/ Chest Pain   Cardiac Monitoring   Cycle Troponins   IV NTG, and IV Heparin drip and ASA ordered    continue Carvedilol Rx, and Losartan Rx     3.   Hypoxemia/ Acute respiratory failure with hypoxia   O2 PRN   Monitor O2 sats    4.    Elevated d-dimer   IV Heparin drip   CTA of Chest Ordered    5.    Hypertensive emergency/Essential hypertension   On IV NTG Drip   Continue Carvedilol, Losartan, LAsix, and SpironolactoneRx   IV Hydralazine PRN    6.    Acute combined systolic and diastolic congestive heart failure/Cardiomyopathy, ischemic   Continue Carvedilol, Losartan, LAsix, and Spironolactone Rx   O2 PRN    7.  Chronic coronary artery disease   On Carvedilol,  Losartan Rx, and Atorvastatin Rx      8.   Diabetes mellitus type II, uncontrolled   Continue Lantus Insulin as Glucose tolerates   Hold Janumet due to Metformin component    SSI coverage PRN   Check HbA1c in AM    9.   Mixed hyperlipidemia   Continue Atorvastatin rx   10.  DVT Prophylaxis   On an IV Heparin drip   Code Status:   FULL CODE    Family Communication:  No Family Present Disposition Plan:      Inpatient Stepdown Unit Time spent:  64 Minutes  Theressa Millard [facility] Pager [phone]   If [place] Please Contact the Day Rounding Team MD for [facility]  If 7PM-7AM, Please Contact Night-Floor Coverage  [email or web address] Password [email or web address] 07/17/2014, 11:03 PM     ADDENDUM:  Patient was seen and examined on 07/17/2014

## 2014-07-17 NOTE — ED Notes (Signed)
Right Radial Artery x 1 attempt for ABG. Site cleaned. Pressure held until bleeding stopped. No complications noted. Pt was on 3lpm [place]. MD aware of results.

## 2014-07-17 NOTE — Progress Notes (Signed)
RT Note:  CPAP machine set up at bedside, with FFM auto titrate settings.  MD at bedside evaluating patient.

## 2014-07-17 NOTE — Progress Notes (Signed)
Pre visit review using our clinic review tool, if applicable. No additional management support is needed unless otherwise documented below in the visit note.

## 2014-07-17 NOTE — ED Notes (Signed)
[facility] is aware of room 3W24C and sending transport as soon as a truck comes available.

## 2014-07-18 ENCOUNTER — Inpatient Hospital Stay (HOSPITAL_COMMUNITY): Payer: BLUE CROSS/BLUE SHIELD

## 2014-07-18 ENCOUNTER — Encounter (HOSPITAL_COMMUNITY): Payer: Self-pay | Admitting: Radiology

## 2014-07-18 DIAGNOSIS — R000 Tachycardia, unspecified: Secondary | ICD-10-CM | POA: Insufficient documentation

## 2014-07-18 DIAGNOSIS — R7989 Other specified abnormal findings of blood chemistry: Secondary | ICD-10-CM | POA: Diagnosis present

## 2014-07-18 DIAGNOSIS — G4733 Obstructive sleep apnea (adult) (pediatric): Secondary | ICD-10-CM

## 2014-07-18 DIAGNOSIS — R079 Chest pain, unspecified: Secondary | ICD-10-CM | POA: Insufficient documentation

## 2014-07-18 DIAGNOSIS — I214 Non-ST elevation (NSTEMI) myocardial infarction: Secondary | ICD-10-CM | POA: Diagnosis present

## 2014-07-18 LAB — GLUCOSE, CAPILLARY
GLUCOSE-CAPILLARY: 259 mg/dL — AB (ref 70–99)
Glucose-Capillary: 187 mg/dL — ABNORMAL HIGH (ref 70–99)
Glucose-Capillary: 282 mg/dL — ABNORMAL HIGH (ref 70–99)
Glucose-Capillary: 154 mg/dL — ABNORMAL HIGH (ref 70–99)
Glucose-Capillary: 148 mg/dL — ABNORMAL HIGH (ref 70–99)

## 2014-07-18 LAB — HEMOGLOBIN A1C
HEMOGLOBIN A1C: 9.9 % — AB (ref <5.7)
MEAN PLASMA GLUCOSE: 237 mg/dL — AB (ref <117)

## 2014-07-18 LAB — BASIC METABOLIC PANEL
ANION GAP: 7 (ref 5–15)
BUN: 13 mg/dL (ref 6–23)
CALCIUM: 8.8 mg/dL (ref 8.4–10.5)
CO2: 29 mmol/L (ref 19–32)
Chloride: 102 mEq/L (ref 96–112)
Creatinine, Ser: 1.04 mg/dL (ref 0.50–1.35)
GFR calc non Af Amer: 81 mL/min — ABNORMAL LOW (ref >90)
Glucose, Bld: 215 mg/dL — ABNORMAL HIGH (ref 70–99)
Potassium: 3.5 mmol/L (ref 3.5–5.1)
SODIUM: 138 mmol/L (ref 135–145)

## 2014-07-18 LAB — COMPREHENSIVE METABOLIC PANEL
ALT: 22 U/L (ref 0–53)
AST: 24 U/L (ref 0–37)
Albumin: 3.5 g/dL (ref 3.5–5.2)
Alkaline Phosphatase: 64 U/L (ref 39–117)
Anion gap: 12 (ref 5–15)
BUN: 11 mg/dL (ref 6–23)
CALCIUM: 9 mg/dL (ref 8.4–10.5)
CHLORIDE: 101 meq/L (ref 96–112)
CO2: 25 mmol/L (ref 19–32)
Creatinine, Ser: 0.97 mg/dL (ref 0.50–1.35)
GLUCOSE: 192 mg/dL — AB (ref 70–99)
Potassium: 3.9 mmol/L (ref 3.5–5.1)
SODIUM: 138 mmol/L (ref 135–145)
TOTAL PROTEIN: 7.3 g/dL (ref 6.0–8.3)
Total Bilirubin: 1.4 mg/dL — ABNORMAL HIGH (ref 0.3–1.2)

## 2014-07-18 LAB — HEPARIN LEVEL (UNFRACTIONATED): Heparin Unfractionated: 0.19 IU/mL — ABNORMAL LOW (ref 0.30–0.70)

## 2014-07-18 LAB — TROPONIN I
Troponin I: 0.07 ng/mL — ABNORMAL HIGH (ref <0.031)
Troponin I: 0.05 ng/mL — ABNORMAL HIGH (ref <0.031)
Troponin I: 0.05 ng/mL — ABNORMAL HIGH (ref <0.031)

## 2014-07-18 MED ORDER — SODIUM CHLORIDE 0.9 % IV SOLN: 250.0000 mL | INTRAVENOUS | Status: DC | PRN

## 2014-07-18 MED ORDER — FUROSEMIDE 10 MG/ML IJ SOLN
80.0000 mg | Freq: Two times a day (BID) | INTRAMUSCULAR | Status: DC
  Administered 2014-07-18 (×2): 80 mg via INTRAVENOUS
  Filled 2014-07-18 (×3): qty 8

## 2014-07-18 MED ORDER — GUAIFENESIN ER 1200 MG PO TB12: 1.0000 | ORAL_TABLET | Freq: Two times a day (BID) | ORAL | Status: DC | PRN

## 2014-07-18 MED ORDER — HEPARIN BOLUS VIA INFUSION
2000.0000 [IU] | Freq: Once | INTRAVENOUS | Status: AC
  Administered 2014-07-18: 2000 [IU] via INTRAVENOUS
  Filled 2014-07-18: qty 2000

## 2014-07-18 MED ORDER — GUAIFENESIN ER 600 MG PO TB12
1200.0000 mg | ORAL_TABLET | Freq: Two times a day (BID) | ORAL | Status: DC | PRN
  Filled 2014-07-18: qty 2

## 2014-07-18 MED ORDER — BUDESONIDE-FORMOTEROL FUMARATE 160-4.5 MCG/ACT IN AERO
2.0000 | INHALATION_SPRAY | Freq: Two times a day (BID) | RESPIRATORY_TRACT | Status: DC
  Administered 2014-07-18 – 2014-07-22 (×9): 2 via RESPIRATORY_TRACT
  Filled 2014-07-18: qty 6

## 2014-07-18 MED ORDER — IOHEXOL 350 MG/ML SOLN: 75.0000 mL | Freq: Once | INTRAVENOUS | Status: AC | PRN

## 2014-07-18 MED ORDER — POTASSIUM CHLORIDE CRYS ER 20 MEQ PO TBCR
40.0000 meq | EXTENDED_RELEASE_TABLET | Freq: Two times a day (BID) | ORAL | Status: DC
  Administered 2014-07-18 (×2): 40 meq via ORAL
  Filled 2014-07-18 (×5): qty 2

## 2014-07-18 MED ORDER — SODIUM CHLORIDE 0.9 % IJ SOLN
3.0000 mL | INTRAMUSCULAR | Status: DC | PRN
  Administered 2014-07-19: 3 mL via INTRAVENOUS
  Filled 2014-07-18: qty 3

## 2014-07-18 MED ORDER — SODIUM CHLORIDE 0.9 % IJ SOLN
3.0000 mL | Freq: Two times a day (BID) | INTRAMUSCULAR | Status: DC
  Administered 2014-07-18 – 2014-07-21 (×8): 3 mL via INTRAVENOUS

## 2014-07-18 MED ORDER — ENOXAPARIN SODIUM 40 MG/0.4ML ~~LOC~~ SOLN
40.0000 mg | SUBCUTANEOUS | Status: DC
  Administered 2014-07-18 – 2014-07-21 (×4): 40 mg via SUBCUTANEOUS
  Filled 2014-07-18 (×4): qty 0.4

## 2014-07-18 MED ORDER — INSULIN ASPART 100 UNIT/ML ~~LOC~~ SOLN: 0.0000 [IU] | Freq: Every day | SUBCUTANEOUS | Status: DC

## 2014-07-18 MED ORDER — INSULIN ASPART 100 UNIT/ML ~~LOC~~ SOLN
0.0000 [IU] | Freq: Three times a day (TID) | SUBCUTANEOUS | Status: DC
Start: 2014-07-18 — End: 2014-07-22
  Administered 2014-07-18: 7 [IU] via SUBCUTANEOUS
  Administered 2014-07-18 – 2014-07-19 (×2): 4 [IU] via SUBCUTANEOUS
  Administered 2014-07-19: 7 [IU] via SUBCUTANEOUS
  Administered 2014-07-19 – 2014-07-20 (×3): 4 [IU] via SUBCUTANEOUS
  Administered 2014-07-20: 7 [IU] via SUBCUTANEOUS
  Administered 2014-07-21: 4 [IU] via SUBCUTANEOUS
  Administered 2014-07-21: 2 [IU] via SUBCUTANEOUS
  Administered 2014-07-21: 7 [IU] via SUBCUTANEOUS
  Administered 2014-07-22: 4 [IU] via SUBCUTANEOUS

## 2014-07-18 MED ORDER — LINAGLIPTIN 5 MG PO TABS
5.0000 mg | ORAL_TABLET | Freq: Every day | ORAL | Status: DC
  Administered 2014-07-18 – 2014-07-22 (×5): 5 mg via ORAL
  Filled 2014-07-18 (×5): qty 1

## 2014-07-18 MED ORDER — POTASSIUM CHLORIDE CRYS ER 10 MEQ PO TBCR
10.0000 meq | EXTENDED_RELEASE_TABLET | Freq: Every day | ORAL | Status: DC
  Administered 2014-07-18: 10 meq via ORAL
  Filled 2014-07-18: qty 1

## 2014-07-18 NOTE — Progress Notes (Signed)
Notified Walden Field with [facility] of pt's decreasing BP, currently the pt is with BP of 72/47.  The pt had received 72m Lasix and PO Coreg at around 1715 this evening.  Order received for 250 ml NS bolus and to continue to monitor further.  Pt is AAO x 4 and with HR of 75.

## 2014-07-18 NOTE — Progress Notes (Addendum)
Placed patient on CPAP via FFM, auto settings with 4 lpm O2 bleed in.  Patient tolerating well at this time.

## 2014-07-18 NOTE — Progress Notes (Signed)
Chart reviewed.   [facility] PROGRESS NOTE  Fernie Grimm ZOX:[id number] DOB: 08-Nov-1960 DOA: 07/17/2014 PCP: Garnet Koyanagi, DO  Assessment/Plan:  Principal Problem:   Acute combined systolic and diastolic congestive heart failure:  Reports having run out of several medications for several days.  Weight is actually down from previous. 220 lbs v 234 12/3.  Echocardiogram pending. Continue IV Lasix. TSH pending. Likely exacerbated from having run out of medications, malignant hypertension, asymptomatic ventricular tachycardia. Active Problems:   Diabetes mellitus type II, uncontrolled:  Metformin held. Resume Januvia. Continue sliding scale. Hemoglobin A1c pending.    Ventricular tachycardia:  Magnesium 1.9. Potassium 3.9 on admission. 3.5 today. Will increase repletion. No further.   Hypertensive emergency:  Likely from having run out of medications.   Chronic coronary artery disease:  No chest pain.   Mixed hyperlipidemia:    Cardiomyopathy, ischemic   Acute respiratory failure with hypoxia:  Secondary to heart failure. CT angiogram of the chest negative for PE, but confirms pulmonary edema.   Elevated troponin:  Doubt acute coronary syndrome. Likely from heart failure hypoxia, etc.   Elevated d-dimer:  No PE. Discontinue heparin drip. Start Lovenox for DVT prophylaxis.   OSA (obstructive sleep apnea):  Reports noncompliance with C Pap since CABG.  Code Status:  full Family Communication:   Disposition Plan:  Continue step down monitoring.  Consultants:  Cardiology  Procedures:     Antibiotics:    HPI/Subjective: "I never really felt Bad" denies shortness of breath chest pain palpitations.  Objective: Filed Vitals:   07/18/14 1000  BP: 108/68  Pulse: 86  Temp:   Resp: 23    Intake/Output Summary (Last 24 hours) at 07/18/14 1034 Last data filed at 07/18/14 1000  Gross per 24 hour  Intake 1233.46 ml  Output   3220 ml  Net -1986.54 ml   Filed Weights    07/17/14 1511 07/17/14 2115  Weight: 99.791 kg (220 lb) 101.3 kg (223 lb 5.2 oz)    Exam:   General:  Watching TV. Breathing nonlabored. Oriented.  Cardiovascular: Regular rate rhythm without murmurs gallops rubs  Respiratory: Clear to Auscultation bilaterally without wheezes rhonchi or rales  Abdomen: Soft nontender nondistended. Normal bowel sounds.  Ext: Trace edema.  Basic Metabolic Panel:  Recent Labs Lab 07/17/14 1545 07/17/14 2307 07/18/14 0525  NA  --  138 138  K  --  3.9 3.5  CL  --  101 102  CO2  --  25 29  GLUCOSE  --  192* 215*  BUN  --  11 13  CREATININE  --  0.97 1.04  CALCIUM  --  9.0 8.8  MG 1.9  --   --    Liver Function Tests:  Recent Labs Lab 07/17/14 2307  AST 24  ALT 22  ALKPHOS 64  BILITOT 1.4*  PROT 7.3  ALBUMIN 3.5   No results for input(s): LIPASE, AMYLASE in the last 168 hours. No results for input(s): AMMONIA in the last 168 hours. CBC:  Recent Labs Lab 07/17/14 1545  WBC 7.3  NEUTROABS 5.4  HGB 14.9  HCT 44.6  MCV 105.7*  PLT 254   Cardiac Enzymes:  Recent Labs Lab 07/17/14 1545 07/17/14 2307 07/18/14 0525  TROPONINI 0.04* 0.07* 0.05*   BNP (last 3 results) No results for input(s): PROBNP in the last 8760 hours. CBG:  Recent Labs Lab 07/17/14 2301 07/18/14 0820  GLUCAP 187* 259*    Recent Results (from the past 240 hour(s))  MRSA  PCR Screening     Status: None   Collection Time: 07/17/14  9:05 PM  Result Value Ref Range Status   MRSA by PCR NEGATIVE NEGATIVE Final    Comment:        The GeneXpert MRSA Assay (FDA approved for NASAL specimens only), is one component of a comprehensive MRSA colonization surveillance program. It is not intended to diagnose MRSA infection nor to guide or monitor treatment for MRSA infections.      Studies: Dg Chest 2 View  07/17/2014   CLINICAL DATA:  Acute onset of cough, wheezing and hypoxemia. Current history of asthma. Prior CABG. Obstructive sleep apnea  requiring CPAP.  EXAM: CHEST  2 VIEW  COMPARISON:  06/13/2013 dating back to 02/16/2012.  FINDINGS: Sternotomy for CABG. Cardiac silhouette mildly enlarged. Thoracic aorta mildly atherosclerotic, unchanged. Hilar and mediastinal contours otherwise unremarkable. Pulmonary venous hypertension with minimal interstitial pulmonary edema as evidenced by scattered Kerley B-lines. Small bilateral pleural effusions suspected. No confluent airspace consolidation. No pneumothorax. Visualized bony thorax intact.  IMPRESSION: Minimal CHF, with stable mild cardiomegaly and minimal interstitial pulmonary edema. Small bilateral pleural effusions.   Electronically Signed   By: Evangeline Dakin M.D.   On: 07/17/2014 16:08   Ct Angio Chest Pe W/cm &/or Wo Cm  07/18/2014   CLINICAL DATA:  Acute onset of shortness of breath and generalized chest pain. Initial encounter.  EXAM: CT ANGIOGRAPHY CHEST WITH CONTRAST  TECHNIQUE: Multidetector CT imaging of the chest was performed using the standard protocol during bolus administration of intravenous contrast. Multiplanar CT image reconstructions and MIPs were obtained to evaluate the vascular anatomy.  CONTRAST:  75 mL of Omnipaque 350 IV contrast  COMPARISON:  Chest radiograph performed 07/17/2014  FINDINGS: There is no evidence of significant pulmonary embolus. Evaluation for pulmonary embolus is mildly suboptimal due to motion artifact.  Trace bilateral pleural effusions are noted, with hazy bilateral airspace opacities, compatible with pulmonary edema. Underlying interstitial prominence is seen. There is no evidence of pneumothorax. No masses are identified; no abnormal focal contrast enhancement is seen.  The heart is mildly enlarged. The patient is status post median sternotomy. Scattered coronary artery calcifications are seen. Visualized mediastinal nodes remain normal in size. No pericardial effusion is identified. Incidental note is made of a retroesophageal right subclavian  artery. The great vessels are otherwise grossly unremarkable. No axillary lymphadenopathy is seen. The visualized portions of the thyroid gland are unremarkable in appearance.  The visualized portions of the liver and spleen are unremarkable. The visualized portions of the pancreas, gallbladder, stomach and adrenal glands are within normal limits. Minimal nonspecific perinephric stranding is noted bilaterally.  No acute osseous abnormalities are seen.  Review of the MIP images confirms the above findings.  IMPRESSION: 1. No evidence of significant pulmonary embolus. 2. Trace bilateral pleural effusions, with hazy bilateral airspace opacities, compatible with pulmonary edema. Underlying interstitial prominence seen. 3. Mild cardiomegaly. 4. Scattered coronary artery calcifications noted.   Electronically Signed   By: Garald Balding M.D.   On: 07/18/2014 03:07    Scheduled Meds: . antiseptic oral rinse  7 mL Mouth Rinse BID  . aspirin  325 mg Oral Daily  . atorvastatin  20 mg Oral q1800  . carvedilol  25 mg Oral BID WC  . fluticasone  2 spray Each Nare Daily  . furosemide  80 mg Intravenous Q12H  . insulin aspart  0-5 Units Subcutaneous QHS  . insulin aspart  0-9 Units Subcutaneous TID WC  .  losartan  50 mg Oral Daily  . potassium chloride  10 mEq Oral Daily  . sodium chloride  3 mL Intravenous Q12H  . spironolactone  12.5 mg Oral Daily   Continuous Infusions: . nitroGLYCERIN 12 mcg/min (07/18/14 0800)    Time spent: 35 minutes  [facility]  [email or web address], password [facility] 07/18/2014, 10:34 AM  LOS: 1 day

## 2014-07-18 NOTE — Progress Notes (Signed)
ANTICOAGULATION CONSULT NOTE - Follow Up Consult  Pharmacy Consult for heparin Indication: chest pain/ACS  Labs:  Recent Labs  07/17/14 1545 07/17/14 2307 07/18/14 0525  HGB 14.9  --   --   HCT 44.6  --   --   PLT 254  --   --   HEPARINUNFRC  --   --  0.19*  CREATININE  --  0.97  --   TROPONINI 0.04* 0.07*  --     Assessment: 54yo male subtherapeutic on heparin with initial dosing for elevated troponin.  Goal of Therapy:  Heparin level 0.3-0.7 units/ml   Plan:  Will rebolus with heparin 2000 units x1 and increase gtt by 3 units/kg/hr to 1700 units/hr and check level in 6hr.  Wynona Neat, PharmD, BCPS  07/18/2014,6:13 AM

## 2014-07-18 NOTE — Progress Notes (Signed)
Patient Name: Kala Gassmann Date of Encounter: 07/18/2014     Principal Problem:   Acute combined systolic and diastolic congestive heart failure Active Problems:   Diabetes mellitus type II, uncontrolled   V tach   Hypertensive emergency   Chronic coronary artery disease   Mixed hyperlipidemia   Cardiomyopathy, ischemic   Acute respiratory failure with hypoxia   Elevated troponin   Elevated d-dimer   NSTEMI (non-ST elevated myocardial infarction)   OSA (obstructive sleep apnea)    SUBJECTIVE  The patient states that he feels fine today.  He states that he really felt pretty good yesterday and went to see his doctor because he had run out of medication for the previous 3 days and needed an office visit so that he could get it refilled.  Denies any chest pain today.  Denies shortness of breath.  CURRENT MEDS . antiseptic oral rinse  7 mL Mouth Rinse BID  . aspirin  325 mg Oral Daily  . atorvastatin  20 mg Oral q1800  . budesonide-formoterol  2 puff Inhalation BID  . carvedilol  25 mg Oral BID WC  . enoxaparin (LOVENOX) injection  40 mg Subcutaneous Q24H  . fluticasone  2 spray Each Nare Daily  . furosemide  80 mg Intravenous Q12H  . insulin aspart  0-20 Units Subcutaneous TID WC  . insulin aspart  0-5 Units Subcutaneous QHS  . linagliptin  5 mg Oral Daily  . losartan  50 mg Oral Daily  . potassium chloride  40 mEq Oral BID  . sodium chloride  3 mL Intravenous Q12H  . spironolactone  12.5 mg Oral Daily    OBJECTIVE  Filed Vitals:   07/18/14 0900 07/18/14 1000 07/18/14 1100 07/18/14 1200  BP: 141/93 108/68 103/66   Pulse: 106 86 73 71  Temp:   97.1 F (36.2 C)   TempSrc:   Axillary   Resp: _0 Height:      Weight:      SpO2: 96% 98% 97% 98%    Intake/Output Summary (Last 24 hours) at 07/18/14 1302 Last data filed at 07/18/14 1200  Gross per 24 hour  Intake 1273.26 ml  Output   3220 ml  Net -1946.74 ml   Filed Weights   07/17/14 1511 07/17/14  2115  Weight: 220 lb (99.791 kg) 223 lb 5.2 oz (101.3 kg)    PHYSICAL EXAM  General: Pleasant, NAD. Neuro: Alert and oriented X 3. Moves all extremities spontaneously. Psych: Normal affect. HEENT:  Normal  Neck: Supple without bruits or JVD. Lungs:  Resp regular and unlabored, CTA. Heart: RRR no s3, s4, or murmurs. Abdomen: Soft, non-tender, non-distended, BS + x 4.  Extremities: No clubbing, cyanosis or edema. DP/PT/Radials 2+ and equal bilaterally.  Accessory Clinical Findings  CBC  Recent Labs  07/17/14 1545  WBC 7.3  NEUTROABS 5.4  HGB 14.9  HCT 44.6  MCV 105.7*  PLT 267   Basic Metabolic Panel  Recent Labs  07/17/14 1545 07/17/14 2307 07/18/14 0525  NA  --  138 138  K  --  3.9 3.5  CL  --  101 102  CO2  --  25 29  GLUCOSE  --  192* 215*  BUN  --  11 13  CREATININE  --  0.97 1.04  CALCIUM  --  9.0 8.8  MG 1.9  --   --    Liver Function Tests  Recent Labs  07/17/14 2307  AST 24  ALT 22  ALKPHOS 64  BILITOT 1.4*  PROT 7.3  ALBUMIN 3.5   No results for input(s): LIPASE, AMYLASE in the last 72 hours. Cardiac Enzymes  Recent Labs  07/17/14 1545 07/17/14 2307 07/18/14 0525  TROPONINI 0.04* 0.07* 0.05*   BNP Invalid input(s): POCBNP D-Dimer  Recent Labs  07/17/14 1545  DDIMER 0.53*   Hemoglobin A1C No results for input(s): HGBA1C in the last 72 hours. Fasting Lipid Panel No results for input(s): CHOL, HDL, LDLCALC, TRIG, CHOLHDL, LDLDIRECT in the last 72 hours. Thyroid Function Tests No results for input(s): TSH, T4TOTAL, T3FREE, THYROIDAB in the last 72 hours.  Invalid input(s): FREET3  TELE  Normal sinus rhythm.  ECG  17-Jul-2014 15:29:41  [facility] ROUTINE RECORD Sinus tachycardia Right superior axis deviation Nonspecific T wave abnormality Abnormal ECG Prior EKG with LAD Confirmed by DOCHERTY MD, MEGAN [phone]) on 07/17/2014 3:37:49 PM  Radiology/Studies  Dg Chest 2 View  07/17/2014   CLINICAL  DATA:  Acute onset of cough, wheezing and hypoxemia. Current history of asthma. Prior CABG. Obstructive sleep apnea requiring CPAP.  EXAM: CHEST  2 VIEW  COMPARISON:  06/13/2013 dating back to 02/16/2012.  FINDINGS: Sternotomy for CABG. Cardiac silhouette mildly enlarged. Thoracic aorta mildly atherosclerotic, unchanged. Hilar and mediastinal contours otherwise unremarkable. Pulmonary venous hypertension with minimal interstitial pulmonary edema as evidenced by scattered Kerley B-lines. Small bilateral pleural effusions suspected. No confluent airspace consolidation. No pneumothorax. Visualized bony thorax intact.  IMPRESSION: Minimal CHF, with stable mild cardiomegaly and minimal interstitial pulmonary edema. Small bilateral pleural effusions.   Electronically Signed   By: Evangeline Dakin M.D.   On: 07/17/2014 16:08   Ct Angio Chest Pe W/cm &/or Wo Cm  07/18/2014   CLINICAL DATA:  Acute onset of shortness of breath and generalized chest pain. Initial encounter.  EXAM: CT ANGIOGRAPHY CHEST WITH CONTRAST  TECHNIQUE: Multidetector CT imaging of the chest was performed using the standard protocol during bolus administration of intravenous contrast. Multiplanar CT image reconstructions and MIPs were obtained to evaluate the vascular anatomy.  CONTRAST:  75 mL of Omnipaque 350 IV contrast  COMPARISON:  Chest radiograph performed 07/17/2014  FINDINGS: There is no evidence of significant pulmonary embolus. Evaluation for pulmonary embolus is mildly suboptimal due to motion artifact.  Trace bilateral pleural effusions are noted, with hazy bilateral airspace opacities, compatible with pulmonary edema. Underlying interstitial prominence is seen. There is no evidence of pneumothorax. No masses are identified; no abnormal focal contrast enhancement is seen.  The heart is mildly enlarged. The patient is status post median sternotomy. Scattered coronary artery calcifications are seen. Visualized mediastinal nodes remain  normal in size. No pericardial effusion is identified. Incidental note is made of a retroesophageal right subclavian artery. The great vessels are otherwise grossly unremarkable. No axillary lymphadenopathy is seen. The visualized portions of the thyroid gland are unremarkable in appearance.  The visualized portions of the liver and spleen are unremarkable. The visualized portions of the pancreas, gallbladder, stomach and adrenal glands are within normal limits. Minimal nonspecific perinephric stranding is noted bilaterally.  No acute osseous abnormalities are seen.  Review of the MIP images confirms the above findings.  IMPRESSION: 1. No evidence of significant pulmonary embolus. 2. Trace bilateral pleural effusions, with hazy bilateral airspace opacities, compatible with pulmonary edema. Underlying interstitial prominence seen. 3. Mild cardiomegaly. 4. Scattered coronary artery calcifications noted.   Electronically Signed   By: Garald Balding M.D.   On: 07/18/2014 03:07    ASSESSMENT  AND PLAN  1.  Acute combined systolic and diastolic congestive heart failure, improving.  Likely secondary to having run out of his medication. 2.  Minimal elevation of troponins consistent with congestive heart failure.  No evidence of acute coronary syndrome. 3.  Hypertensive emergency, resolved 4.  Asymptomatic wide complex tachycardia noted in [facility] [place] emergency room, tracings are not available for review.  The patient has had no further arrhythmia since admission here. Rare PVCs on telemetry.  QTc interval normal.  5.  Ischemic heart disease status post CABG 6.  Obstructive sleep apnea.  Noncompliant with using his CPAP machine since his bypass surgery 2 years ago  Recommendation: 2-D echo is pending.  We will stop his IV nitroglycerin now.  Advance activity as tolerated. He is improving rapidly since getting back on his home medications.  Signed, Darlin Coco MD

## 2014-07-18 NOTE — Progress Notes (Signed)
Placed patient on CPAP via FFM, auto settings with 4 lpm o2 bleed in. Patient tolerating well at this time.

## 2014-07-18 NOTE — Plan of Care (Signed)
Problem: Phase I Progression Outcomes Goal: Hemodynamically stable Outcome: Progressing Patient's BP was 180-190s/130's, BP is improving with Nitro drip.

## 2014-07-19 DIAGNOSIS — N189 Chronic kidney disease, unspecified: Secondary | ICD-10-CM | POA: Diagnosis present

## 2014-07-19 DIAGNOSIS — N182 Chronic kidney disease, stage 2 (mild): Secondary | ICD-10-CM

## 2014-07-19 LAB — GLUCOSE, CAPILLARY
GLUCOSE-CAPILLARY: 167 mg/dL — AB (ref 70–99)
GLUCOSE-CAPILLARY: 182 mg/dL — AB (ref 70–99)
Glucose-Capillary: 202 mg/dL — ABNORMAL HIGH (ref 70–99)
Glucose-Capillary: 184 mg/dL — ABNORMAL HIGH (ref 70–99)

## 2014-07-19 LAB — BASIC METABOLIC PANEL
Anion gap: 7 (ref 5–15)
BUN: 28 mg/dL — AB (ref 6–23)
CHLORIDE: 106 meq/L (ref 96–112)
CO2: 25 mmol/L (ref 19–32)
Calcium: 9.1 mg/dL (ref 8.4–10.5)
Creatinine, Ser: 1.5 mg/dL — ABNORMAL HIGH (ref 0.50–1.35)
GFR calc Af Amer: 60 mL/min — ABNORMAL LOW (ref >90)
GFR, EST NON AFRICAN AMERICAN: 52 mL/min — AB (ref >90)
Glucose, Bld: 200 mg/dL — ABNORMAL HIGH (ref 70–99)
POTASSIUM: 4.3 mmol/L (ref 3.5–5.1)
SODIUM: 138 mmol/L (ref 135–145)

## 2014-07-19 MED ORDER — FUROSEMIDE 40 MG PO TABS
40.0000 mg | ORAL_TABLET | Freq: Every day | ORAL | Status: DC
  Administered 2014-07-20: 40 mg via ORAL
  Filled 2014-07-19: qty 1

## 2014-07-19 MED ORDER — CARVEDILOL 6.25 MG PO TABS
6.2500 mg | ORAL_TABLET | Freq: Two times a day (BID) | ORAL | Status: DC
  Administered 2014-07-19 – 2014-07-22 (×7): 6.25 mg via ORAL
  Filled 2014-07-19 (×9): qty 1

## 2014-07-19 NOTE — Progress Notes (Signed)
[facility] PROGRESS NOTE  Vincent Chandler VQX:[id number] DOB: 05-Mar-1961 DOA: 07/17/2014 PCP: Garnet Koyanagi, DO  Overnight Dropped blood pressure to 70/40, given 250 cc saline.  Assessment/Plan:  Principal Problem:   Acute combined systolic and diastolic congestive heart failure:  Secondary to running out of medications. Improving.  Blood pressure dropped last night and creatinine increased. Will change to po lasix.   Active Problems:   Diabetes mellitus type II, uncontrolled:  Metformin held. Resume Januvia. Continue sliding scale. Hemoglobin A1c 9.9   Ventricular tachycardia:  None further lytes ok   Hypertensive emergency:  Ran out of meds.  HYPOTENSIVE overnight. Will hold Cozaar. Decrease carvedilol dose. Stop IV Lasix. Change to by mouth Lasix. Monitor and step down for another day. Possibly home tomorrow if blood pressure, labs, clinical status improves/stable   Chronic coronary artery disease:  No chest pain.   Mixed hyperlipidemia:    Cardiomyopathy, ischemic   Acute respiratory failure with hypoxia:  Secondary to heart failure. CT angiogram of the chest negative for PE, but confirms pulmonary edema.   Elevated troponin:  Doubt acute coronary syndrome. Likely from heart failure hypoxia, etc.   Elevated d-dimer:  No PE.    OSA (obstructive sleep apnea):  Reports noncompliance with C Pap since CABG.  Code Status:  full Family Communication:   Disposition Plan:  Continue step down monitoring.   Consultants:  Cardiology  Procedures:     Antibiotics:    HPI/Subjective: No complaints. Breathing easier. No chest pain. No palpitations. Has not been up and walking yet.  Objective: Filed Vitals:   07/19/14 0757  BP:   Pulse:   Temp: 97.7 F (36.5 C)  Resp:     Intake/Output Summary (Last 24 hours) at 07/19/14 0834 Last data filed at 07/19/14 0700  Gross per 24 hour  Intake    600 ml  Output   1320 ml  Net   -720 ml   Filed Weights   07/17/14 1511  07/17/14 2115 07/19/14 0300  Weight: 99.791 kg (220 lb) 101.3 kg (223 lb 5.2 oz) 101.5 kg (223 lb 12.3 oz)    Exam:   General:  Asleep on C Pap. Comfortable. Arousable.  Cardiovascular: Regular rate rhythm without murmurs gallops rubs  Respiratory: Clear to Auscultation bilaterally without wheezes rhonchi or rales  Abdomen: Soft nontender nondistended. Normal bowel sounds.  Ext: Trace edema.  Basic Metabolic Panel:  Recent Labs Lab 07/17/14 1545 07/17/14 2307 07/18/14 0525 07/19/14 0333  NA  --  138 138 138  K  --  3.9 3.5 4.3  CL  --  101 102 106  CO2  --  _0 GLUCOSE  --  192* 215* 200*  BUN  --  11 13 28*  CREATININE  --  0.97 1.04 1.50*  CALCIUM  --  9.0 8.8 9.1  MG 1.9  --   --   --    Liver Function Tests:  Recent Labs Lab 07/17/14 2307  AST 24  ALT 22  ALKPHOS 64  BILITOT 1.4*  PROT 7.3  ALBUMIN 3.5   No results for input(s): LIPASE, AMYLASE in the last 168 hours. No results for input(s): AMMONIA in the last 168 hours. CBC:  Recent Labs Lab 07/17/14 1545  WBC 7.3  NEUTROABS 5.4  HGB 14.9  HCT 44.6  MCV 105.7*  PLT 254   Cardiac Enzymes:  Recent Labs Lab 07/17/14 1545 07/17/14 2307 07/18/14 0525 07/18/14 1045  TROPONINI 0.04* 0.07* 0.05* 0.05*  BNP (last 3 results) No results for input(s): PROBNP in the last 8760 hours. CBG:  Recent Labs Lab 07/17/14 2301 07/18/14 0820 07/18/14 1205 07/18/14 1631 07/18/14 2106  GLUCAP 187* 259* 282* 154* 148*    Recent Results (from the past 240 hour(s))  MRSA PCR Screening     Status: None   Collection Time: 07/17/14  9:05 PM  Result Value Ref Range Status   MRSA by PCR NEGATIVE NEGATIVE Final    Comment:        The GeneXpert MRSA Assay (FDA approved for NASAL specimens only), is one component of a comprehensive MRSA colonization surveillance program. It is not intended to diagnose MRSA infection nor to guide or monitor treatment for MRSA infections.      Studies: Dg  Chest 2 View  07/17/2014   CLINICAL DATA:  Acute onset of cough, wheezing and hypoxemia. Current history of asthma. Prior CABG. Obstructive sleep apnea requiring CPAP.  EXAM: CHEST  2 VIEW  COMPARISON:  06/13/2013 dating back to 02/16/2012.  FINDINGS: Sternotomy for CABG. Cardiac silhouette mildly enlarged. Thoracic aorta mildly atherosclerotic, unchanged. Hilar and mediastinal contours otherwise unremarkable. Pulmonary venous hypertension with minimal interstitial pulmonary edema as evidenced by scattered Kerley B-lines. Small bilateral pleural effusions suspected. No confluent airspace consolidation. No pneumothorax. Visualized bony thorax intact.  IMPRESSION: Minimal CHF, with stable mild cardiomegaly and minimal interstitial pulmonary edema. Small bilateral pleural effusions.   Electronically Signed   By: Evangeline Dakin M.D.   On: 07/17/2014 16:08   Ct Angio Chest Pe W/cm &/or Wo Cm  07/18/2014   CLINICAL DATA:  Acute onset of shortness of breath and generalized chest pain. Initial encounter.  EXAM: CT ANGIOGRAPHY CHEST WITH CONTRAST  TECHNIQUE: Multidetector CT imaging of the chest was performed using the standard protocol during bolus administration of intravenous contrast. Multiplanar CT image reconstructions and MIPs were obtained to evaluate the vascular anatomy.  CONTRAST:  75 mL of Omnipaque 350 IV contrast  COMPARISON:  Chest radiograph performed 07/17/2014  FINDINGS: There is no evidence of significant pulmonary embolus. Evaluation for pulmonary embolus is mildly suboptimal due to motion artifact.  Trace bilateral pleural effusions are noted, with hazy bilateral airspace opacities, compatible with pulmonary edema. Underlying interstitial prominence is seen. There is no evidence of pneumothorax. No masses are identified; no abnormal focal contrast enhancement is seen.  The heart is mildly enlarged. The patient is status post median sternotomy. Scattered coronary artery calcifications are seen.  Visualized mediastinal nodes remain normal in size. No pericardial effusion is identified. Incidental note is made of a retroesophageal right subclavian artery. The great vessels are otherwise grossly unremarkable. No axillary lymphadenopathy is seen. The visualized portions of the thyroid gland are unremarkable in appearance.  The visualized portions of the liver and spleen are unremarkable. The visualized portions of the pancreas, gallbladder, stomach and adrenal glands are within normal limits. Minimal nonspecific perinephric stranding is noted bilaterally.  No acute osseous abnormalities are seen.  Review of the MIP images confirms the above findings.  IMPRESSION: 1. No evidence of significant pulmonary embolus. 2. Trace bilateral pleural effusions, with hazy bilateral airspace opacities, compatible with pulmonary edema. Underlying interstitial prominence seen. 3. Mild cardiomegaly. 4. Scattered coronary artery calcifications noted.   Electronically Signed   By: Garald Balding M.D.   On: 07/18/2014 03:07    Scheduled Meds: . antiseptic oral rinse  7 mL Mouth Rinse BID  . aspirin  325 mg Oral Daily  . atorvastatin  20 mg Oral  q1800  . budesonide-formoterol  2 puff Inhalation BID  . enoxaparin (LOVENOX) injection  40 mg Subcutaneous Q24H  . fluticasone  2 spray Each Nare Daily  . insulin aspart  0-20 Units Subcutaneous TID WC  . insulin aspart  0-5 Units Subcutaneous QHS  . linagliptin  5 mg Oral Daily  . potassium chloride  40 mEq Oral BID  . sodium chloride  3 mL Intravenous Q12H  . spironolactone  12.5 mg Oral Daily   Continuous Infusions:    Time spent: 35 minutes  [facility]  [email or web address], password [facility] 07/19/2014, 8:34 AM  LOS: 2 days

## 2014-07-19 NOTE — Progress Notes (Signed)
Mr Testa ambulated around the unit 2 times, 2 liters O2 [place] Sats in the upper 90's. No complications noted.  Desmond Dike RN

## 2014-07-19 NOTE — Progress Notes (Signed)
  Echocardiogram 2D Echocardiogram has been performed.  Vincent Chandler 07/19/2014, 10:44 AM

## 2014-07-19 NOTE — Progress Notes (Signed)
UR Completed.  [phone]  

## 2014-07-19 NOTE — Progress Notes (Signed)
Patient Name: Vincent Chandler Date of Encounter: 07/19/2014     Principal Problem:   Acute combined systolic and diastolic congestive heart failure Active Problems:   Diabetes mellitus type II, uncontrolled   V tach   Hypertensive emergency   Chronic coronary artery disease   Mixed hyperlipidemia   Cardiomyopathy, ischemic   Acute respiratory failure with hypoxia   Elevated troponin   Elevated d-dimer   NSTEMI (non-ST elevated myocardial infarction)   OSA (obstructive sleep apnea)   CKD (chronic kidney disease) 2-3    SUBJECTIVE  The patient feels well today.  No chest discomfort.  Denies shortness of breath.  He is looking forward to ambulating in the hall today.  His blood pressure overnight was low and his medications have been cut back appropriately by primary service. He has not been aware of any dizziness.  Telemetry shows normal sinus rhythm.  CURRENT MEDS . antiseptic oral rinse  7 mL Mouth Rinse BID  . aspirin  325 mg Oral Daily  . atorvastatin  20 mg Oral q1800  . budesonide-formoterol  2 puff Inhalation BID  . carvedilol  6.25 mg Oral BID WC  . enoxaparin (LOVENOX) injection  40 mg Subcutaneous Q24H  . fluticasone  2 spray Each Nare Daily  . [START ON 07/20/2014] furosemide  40 mg Oral Daily  . insulin aspart  0-20 Units Subcutaneous TID WC  . insulin aspart  0-5 Units Subcutaneous QHS  . linagliptin  5 mg Oral Daily  . sodium chloride  3 mL Intravenous Q12H  . spironolactone  12.5 mg Oral Daily    OBJECTIVE  Filed Vitals:   07/19/14 0610 07/19/14 0700 07/19/14 0757 07/19/14 0800  BP:  124/75  125/73  Pulse: 68 73  73  Temp:   97.7 F (36.5 C)   TempSrc:   Axillary   Resp: _0 Height:      Weight:      SpO2: 95% 98%  97%    Intake/Output Summary (Last 24 hours) at 07/19/14 1113 Last data filed at 07/19/14 0900  Gross per 24 hour  Intake    422 ml  Output   1000 ml  Net   -578 ml   Filed Weights   07/17/14 1511 07/17/14 2115 07/19/14  0300  Weight: 220 lb (99.791 kg) 223 lb 5.2 oz (101.3 kg) 223 lb 12.3 oz (101.5 kg)    PHYSICAL EXAM  General: Pleasant, NAD.  Nasal oxygen in place at the present time. Neuro: Alert and oriented X 3. Moves all extremities spontaneously. Psych: Normal affect. HEENT:  Normal  Neck: Supple without bruits or JVD. Lungs:  Resp regular and unlabored, CTA. Heart: RRR no s3, s4, or murmurs. Abdomen: Soft, non-tender, non-distended, BS + x 4.  Extremities: No clubbing, cyanosis or edema. DP/PT/Radials 2+ and equal bilaterally.  Accessory Clinical Findings  CBC  Recent Labs  07/17/14 1545  WBC 7.3  NEUTROABS 5.4  HGB 14.9  HCT 44.6  MCV 105.7*  PLT 841   Basic Metabolic Panel  Recent Labs  07/17/14 1545  07/18/14 0525 07/19/14 0333  NA  --   < > 138 138  K  --   < > 3.5 4.3  CL  --   < > 102 106  CO2  --   < > 29 25  GLUCOSE  --   < > 215* 200*  BUN  --   < > 13 28*  CREATININE  --   < >  1.04 1.50*  CALCIUM  --   < > 8.8 9.1  MG 1.9  --   --   --   < > = values in this interval not displayed. Liver Function Tests  Recent Labs  07/17/14 2307  AST 24  ALT 22  ALKPHOS 64  BILITOT 1.4*  PROT 7.3  ALBUMIN 3.5   No results for input(s): LIPASE, AMYLASE in the last 72 hours. Cardiac Enzymes  Recent Labs  07/17/14 2307 07/18/14 0525 07/18/14 1045  TROPONINI 0.07* 0.05* 0.05*   BNP Invalid input(s): POCBNP D-Dimer  Recent Labs  07/17/14 1545  DDIMER 0.53*   Hemoglobin A1C  Recent Labs  07/18/14 0525  HGBA1C 9.9*   Fasting Lipid Panel No results for input(s): CHOL, HDL, LDLCALC, TRIG, CHOLHDL, LDLDIRECT in the last 72 hours. Thyroid Function Tests No results for input(s): TSH, T4TOTAL, T3FREE, THYROIDAB in the last 72 hours.  Invalid input(s): FREET3  TELE  Normal sinus rhythm.  No ventricular tachycardia.  2-D echo pending:  Radiology/Studies  Dg Chest 2 View  07/17/2014   CLINICAL DATA:  Acute onset of cough, wheezing and hypoxemia.  Current history of asthma. Prior CABG. Obstructive sleep apnea requiring CPAP.  EXAM: CHEST  2 VIEW  COMPARISON:  06/13/2013 dating back to 02/16/2012.  FINDINGS: Sternotomy for CABG. Cardiac silhouette mildly enlarged. Thoracic aorta mildly atherosclerotic, unchanged. Hilar and mediastinal contours otherwise unremarkable. Pulmonary venous hypertension with minimal interstitial pulmonary edema as evidenced by scattered Kerley B-lines. Small bilateral pleural effusions suspected. No confluent airspace consolidation. No pneumothorax. Visualized bony thorax intact.  IMPRESSION: Minimal CHF, with stable mild cardiomegaly and minimal interstitial pulmonary edema. Small bilateral pleural effusions.   Electronically Signed   By: Evangeline Dakin M.D.   On: 07/17/2014 16:08   Ct Angio Chest Pe W/cm &/or Wo Cm  07/18/2014   CLINICAL DATA:  Acute onset of shortness of breath and generalized chest pain. Initial encounter.  EXAM: CT ANGIOGRAPHY CHEST WITH CONTRAST  TECHNIQUE: Multidetector CT imaging of the chest was performed using the standard protocol during bolus administration of intravenous contrast. Multiplanar CT image reconstructions and MIPs were obtained to evaluate the vascular anatomy.  CONTRAST:  75 mL of Omnipaque 350 IV contrast  COMPARISON:  Chest radiograph performed 07/17/2014  FINDINGS: There is no evidence of significant pulmonary embolus. Evaluation for pulmonary embolus is mildly suboptimal due to motion artifact.  Trace bilateral pleural effusions are noted, with hazy bilateral airspace opacities, compatible with pulmonary edema. Underlying interstitial prominence is seen. There is no evidence of pneumothorax. No masses are identified; no abnormal focal contrast enhancement is seen.  The heart is mildly enlarged. The patient is status post median sternotomy. Scattered coronary artery calcifications are seen. Visualized mediastinal nodes remain normal in size. No pericardial effusion is identified.  Incidental note is made of a retroesophageal right subclavian artery. The great vessels are otherwise grossly unremarkable. No axillary lymphadenopathy is seen. The visualized portions of the thyroid gland are unremarkable in appearance.  The visualized portions of the liver and spleen are unremarkable. The visualized portions of the pancreas, gallbladder, stomach and adrenal glands are within normal limits. Minimal nonspecific perinephric stranding is noted bilaterally.  No acute osseous abnormalities are seen.  Review of the MIP images confirms the above findings.  IMPRESSION: 1. No evidence of significant pulmonary embolus. 2. Trace bilateral pleural effusions, with hazy bilateral airspace opacities, compatible with pulmonary edema. Underlying interstitial prominence seen. 3. Mild cardiomegaly. 4. Scattered coronary artery calcifications noted.  Electronically Signed   By: Garald Balding M.D.   On: 07/18/2014 03:07    ASSESSMENT AND PLAN  1. Acute combined systolic and diastolic congestive heart failure, improving. Likely secondary to having run out of his medication. 2. Minimal elevation of troponins consistent with congestive heart failure. No evidence of acute coronary syndrome.  Troponins are trending down 3. Hypertensive emergency, resolved.  Hypotensive during the night.  Creatinine is up.  Diuretics have been decreased. 4. Asymptomatic wide complex tachycardia noted in [facility] [place] emergency room, tracings are not available for review. The patient has had no further arrhythmia since admission here. Rare PVCs on telemetry. QTc interval normal.  5. Ischemic heart disease status post CABG 6. Obstructive sleep apnea. Noncompliant with using his CPAP machine since his bypass surgery 2 years ago  Recommendation: Increase activity today.  Probably home in a.m. Blood pressure medicines have been adjusted downward.  Signed, Darlin Coco MD

## 2014-07-20 LAB — GLUCOSE, CAPILLARY
GLUCOSE-CAPILLARY: 168 mg/dL — AB (ref 70–99)
GLUCOSE-CAPILLARY: 187 mg/dL — AB (ref 70–99)
Glucose-Capillary: 244 mg/dL — ABNORMAL HIGH (ref 70–99)
Glucose-Capillary: 151 mg/dL — ABNORMAL HIGH (ref 70–99)

## 2014-07-20 LAB — BASIC METABOLIC PANEL
ANION GAP: 5 (ref 5–15)
BUN: 25 mg/dL — AB (ref 6–23)
CHLORIDE: 105 meq/L (ref 96–112)
CO2: 26 mmol/L (ref 19–32)
Calcium: 9.2 mg/dL (ref 8.4–10.5)
Creatinine, Ser: 1.19 mg/dL (ref 0.50–1.35)
GFR calc Af Amer: 80 mL/min — ABNORMAL LOW (ref >90)
GFR calc non Af Amer: 69 mL/min — ABNORMAL LOW (ref >90)
GLUCOSE: 187 mg/dL — AB (ref 70–99)
Potassium: 4.1 mmol/L (ref 3.5–5.1)
Sodium: 136 mmol/L (ref 135–145)

## 2014-07-20 MED ORDER — FUROSEMIDE 40 MG PO TABS: 40.0000 mg | ORAL_TABLET | Freq: Every day | ORAL | Status: DC

## 2014-07-20 NOTE — Progress Notes (Signed)
Patient ID: Vincent Chandler, male   DOB: 12-13-1960, 54 y.o.   MRN: [id number]   SUBJECTIVE: No dyspnea now.  Feels good.  Never had chest pain.  Echo yesterday showed fall in EF from 40% in 9/14 to 20-25%.    Scheduled Meds: . antiseptic oral rinse  7 mL Mouth Rinse BID  . aspirin  325 mg Oral Daily  . atorvastatin  20 mg Oral q1800  . budesonide-formoterol  2 puff Inhalation BID  . carvedilol  6.25 mg Oral BID WC  . enoxaparin (LOVENOX) injection  40 mg Subcutaneous Q24H  . fluticasone  2 spray Each Nare Daily  . furosemide  40 mg Oral Daily  . insulin aspart  0-20 Units Subcutaneous TID WC  . insulin aspart  0-5 Units Subcutaneous QHS  . linagliptin  5 mg Oral Daily  . sodium chloride  3 mL Intravenous Q12H  . spironolactone  12.5 mg Oral Daily   Continuous Infusions:  PRN Meds:.sodium chloride, acetaminophen **OR** acetaminophen, alum & mag hydroxide-simeth, guaiFENesin, HYDROmorphone (DILAUDID) injection, ondansetron **OR** ondansetron (ZOFRAN) IV, oxyCODONE, sodium chloride    Filed Vitals:   07/20/14 0900 07/20/14 0905 07/20/14 1000 07/20/14 1100  BP: 130/68     Pulse: 91  87 88  Temp:      TempSrc:      Resp:      Height:      Weight:      SpO2: 95% 96% 91% 93%    Intake/Output Summary (Last 24 hours) at 07/20/14 1117 Last data filed at 07/20/14 0900  Gross per 24 hour  Intake    776 ml  Output    750 ml  Net     26 ml    LABS: Basic Metabolic Panel:  Recent Labs  07/17/14 1545  07/19/14 0333 07/20/14 0235  NA  --   < > 138 136  K  --   < > 4.3 4.1  CL  --   < > 106 105  CO2  --   < > 25 26  GLUCOSE  --   < > 200* 187*  BUN  --   < > 28* 25*  CREATININE  --   < > 1.50* 1.19  CALCIUM  --   < > 9.1 9.2  MG 1.9  --   --   --   < > = values in this interval not displayed. Liver Function Tests:  Recent Labs  07/17/14 2307  AST 24  ALT 22  ALKPHOS 64  BILITOT 1.4*  PROT 7.3  ALBUMIN 3.5   No results for input(s): LIPASE, AMYLASE in the last 72  hours. CBC:  Recent Labs  07/17/14 1545  WBC 7.3  NEUTROABS 5.4  HGB 14.9  HCT 44.6  MCV 105.7*  PLT 254   Cardiac Enzymes:  Recent Labs  07/17/14 2307 07/18/14 0525 07/18/14 1045  TROPONINI 0.07* 0.05* 0.05*   BNP: Invalid input(s): POCBNP D-Dimer:  Recent Labs  07/17/14 1545  DDIMER 0.53*   Hemoglobin A1C:  Recent Labs  07/18/14 0525  HGBA1C 9.9*   Fasting Lipid Panel: No results for input(s): CHOL, HDL, LDLCALC, TRIG, CHOLHDL, LDLDIRECT in the last 72 hours. Thyroid Function Tests: No results for input(s): TSH, T4TOTAL, T3FREE, THYROIDAB in the last 72 hours.  Invalid input(s): FREET3 Anemia Panel: No results for input(s): VITAMINB12, FOLATE, FERRITIN, TIBC, IRON, RETICCTPCT in the last 72 hours.  RADIOLOGY: Dg Chest 2 View  07/17/2014   CLINICAL DATA:  Acute  onset of cough, wheezing and hypoxemia. Current history of asthma. Prior CABG. Obstructive sleep apnea requiring CPAP.  EXAM: CHEST  2 VIEW  COMPARISON:  06/13/2013 dating back to 02/16/2012.  FINDINGS: Sternotomy for CABG. Cardiac silhouette mildly enlarged. Thoracic aorta mildly atherosclerotic, unchanged. Hilar and mediastinal contours otherwise unremarkable. Pulmonary venous hypertension with minimal interstitial pulmonary edema as evidenced by scattered Kerley B-lines. Small bilateral pleural effusions suspected. No confluent airspace consolidation. No pneumothorax. Visualized bony thorax intact.  IMPRESSION: Minimal CHF, with stable mild cardiomegaly and minimal interstitial pulmonary edema. Small bilateral pleural effusions.   Electronically Signed   By: Evangeline Dakin M.D.   On: 07/17/2014 16:08   Ct Angio Chest Pe W/cm &/or Wo Cm  07/18/2014   CLINICAL DATA:  Acute onset of shortness of breath and generalized chest pain. Initial encounter.  EXAM: CT ANGIOGRAPHY CHEST WITH CONTRAST  TECHNIQUE: Multidetector CT imaging of the chest was performed using the standard protocol during bolus  administration of intravenous contrast. Multiplanar CT image reconstructions and MIPs were obtained to evaluate the vascular anatomy.  CONTRAST:  75 mL of Omnipaque 350 IV contrast  COMPARISON:  Chest radiograph performed 07/17/2014  FINDINGS: There is no evidence of significant pulmonary embolus. Evaluation for pulmonary embolus is mildly suboptimal due to motion artifact.  Trace bilateral pleural effusions are noted, with hazy bilateral airspace opacities, compatible with pulmonary edema. Underlying interstitial prominence is seen. There is no evidence of pneumothorax. No masses are identified; no abnormal focal contrast enhancement is seen.  The heart is mildly enlarged. The patient is status post median sternotomy. Scattered coronary artery calcifications are seen. Visualized mediastinal nodes remain normal in size. No pericardial effusion is identified. Incidental note is made of a retroesophageal right subclavian artery. The great vessels are otherwise grossly unremarkable. No axillary lymphadenopathy is seen. The visualized portions of the thyroid gland are unremarkable in appearance.  The visualized portions of the liver and spleen are unremarkable. The visualized portions of the pancreas, gallbladder, stomach and adrenal glands are within normal limits. Minimal nonspecific perinephric stranding is noted bilaterally.  No acute osseous abnormalities are seen.  Review of the MIP images confirms the above findings.  IMPRESSION: 1. No evidence of significant pulmonary embolus. 2. Trace bilateral pleural effusions, with hazy bilateral airspace opacities, compatible with pulmonary edema. Underlying interstitial prominence seen. 3. Mild cardiomegaly. 4. Scattered coronary artery calcifications noted.   Electronically Signed   By: Garald Balding M.D.   On: 07/18/2014 03:07    PHYSICAL EXAM General: NAD Neck: Thick, no JVD, no thyromegaly or thyroid nodule.  Lungs: Clear to auscultation bilaterally with normal  respiratory effort. CV: Nondisplaced PMI.  Heart regular S1/S2, no S3/S4, no murmur.  No peripheral edema.  No carotid bruit.  Normal pedal pulses.  Abdomen: Soft, nontender, no hepatosplenomegaly, no distention.  Neurologic: Alert and oriented x 3.  Psych: Normal affect. Extremities: No clubbing or cyanosis.   TELEMETRY: Reviewed telemetry pt in NSR  ASSESSMENT AND PLAN: 54 yo with history of CAD s/p CABG and ischemic CMP was admitted with acute on chronic systolic CHF.   1. Acute on chronic systolic CHF: Ischemic cardiomyopathy.  EF 20-25% by echo, this is lower than in the past (40% in 9/14).  He was not marked symptomatic but had low oxygen saturation when evaluated by PCP. Some dyspnea, some atypical chest pain.  He was diuresed with IV Lasix then transitioned to po.  Says he is breathing well.  Exam difficult for volume but does  not appear significantly volume overloaded currently.  - Continue Coreg and spironolactone.  - Continue po Lasix (hold am dose for cath).  - Will resume losartan after cardiac cath.  2. CAD: s/p CABG.  Atypical chest pain prior to admission.  Mild increase in troponin to peak 0.07, possibly this is due to demand ischemia with volume overload.  However, EF has fallen from 40% to 20-25%.  Concern for progression of CAD.  Creatinine better today.  - I will arrange for cardiac cath, will need to be tomorrow based on cath lab schedule.  - Continue ASA, statin.  3. AKI: With diuresis.  Creatinine now back down.  4. OSA: Needs to resume CPAP use.  Will need followup with pulmonary.   Loralie Champagne 07/20/2014 11:23 AM

## 2014-07-20 NOTE — Progress Notes (Signed)
[facility] PROGRESS NOTE  Vincent Chandler WPY:[id number] DOB: April 11, 1961 DOA: 07/17/2014 PCP: Garnet Koyanagi, DO  summary 54 yo male ran out of medications several days PTA, presented to ED with hypertensive emergency and acute CHF, respiratory failure.  Also reported wide complex tachycardia in ED.  Antihypertensives resumed and pressure dropped transiently after admission.  Creatinine increased slightly. Antihypertensives and diuretics adjusted accordingly.    Assessment/Plan:  Principal Problem:   Acute combined systolic and diastolic congestive heart failure: Improving clinically.  Echo shows worse EF.  20-25%. For cath tomorrow. Active Problems:   Diabetes mellitus type II, uncontrolled:  Metformin held. Resumed Januvia. Continue sliding scale. Hemoglobin A1c 9.9. Consider adding sulfonylurea or other at discharge   Ventricular tachycardia:  None further. lytes ok   Hypertensive emergency:  Ran out of meds.  Cozaar held due to creatine increase and hypotension. Decreased carvedilol dose. Change to by mouth Lasix.    Chronic coronary artery disease:  No chest pain.   Mixed hyperlipidemia:    Cardiomyopathy, ischemic   Acute respiratory failure with hypoxia:  Secondary to heart failure. CT angiogram of the chest negative for PE   Elevated troponin:  Doubt acute coronary syndrome. Likely from heart failure hypoxia, etc.   Elevated d-dimer:  No PE.    OSA (obstructive sleep apnea):  Reports noncompliance with C Pap since CABG.  Code Status:  full Family Communication:   Disposition Plan:  Transfer to tele  Consultants:  Cardiology  Procedures:     Antibiotics:    HPI/Subjective: No complaints. Breathing fine.  Anxious to go home  Objective: Filed Vitals:   07/20/14 1117  BP: 126/78  Pulse: 84  Temp: 97.5 F (36.4 C)  Resp:     Intake/Output Summary (Last 24 hours) at 07/20/14 1629 Last data filed at 07/20/14 1500  Gross per 24 hour  Intake   1166 ml   Output    450 ml  Net    716 ml   Filed Weights   07/17/14 2115 07/19/14 0300 07/20/14 0404  Weight: 101.3 kg (223 lb 5.2 oz) 101.5 kg (223 lb 12.3 oz) 102.4 kg (225 lb 12 oz)    Exam:   General:  In chair. Talking on phone  Cardiovascular: Regular rate rhythm without murmurs gallops rubs  Respiratory: Clear to Auscultation bilaterally without wheezes rhonchi or rales  Abdomen: Soft nontender nondistended. Normal bowel sounds.  Ext: Trace edema.  Basic Metabolic Panel:  Recent Labs Lab 07/17/14 1545 07/17/14 2307 07/18/14 0525 07/19/14 0333 07/20/14 0235  NA  --  138 138 138 136  K  --  3.9 3.5 4.3 4.1  CL  --  101 102 106 105  CO2  --  _0 GLUCOSE  --  192* 215* 200* 187*  BUN  --  11 13 28* 25*  CREATININE  --  0.97 1.04 1.50* 1.19  CALCIUM  --  9.0 8.8 9.1 9.2  MG 1.9  --   --   --   --    Liver Function Tests:  Recent Labs Lab 07/17/14 2307  AST 24  ALT 22  ALKPHOS 64  BILITOT 1.4*  PROT 7.3  ALBUMIN 3.5   No results for input(s): LIPASE, AMYLASE in the last 168 hours. No results for input(s): AMMONIA in the last 168 hours. CBC:  Recent Labs Lab 07/17/14 1545  WBC 7.3  NEUTROABS 5.4  HGB 14.9  HCT 44.6  MCV 105.7*  PLT 254   Cardiac  Enzymes:  Recent Labs Lab 07/17/14 1545 07/17/14 2307 07/18/14 0525 07/18/14 1045  TROPONINI 0.04* 0.07* 0.05* 0.05*   BNP (last 3 results) No results for input(s): PROBNP in the last 8760 hours. CBG:  Recent Labs Lab 07/19/14 0759 07/19/14 1206 07/19/14 1656 07/19/14 2134 07/20/14 0827  GLUCAP 167* 202* 182* 184* 168*    Recent Results (from the past 240 hour(s))  MRSA PCR Screening     Status: None   Collection Time: 07/17/14  9:05 PM  Result Value Ref Range Status   MRSA by PCR NEGATIVE NEGATIVE Final    Comment:        The GeneXpert MRSA Assay (FDA approved for NASAL specimens only), is one component of a comprehensive MRSA colonization surveillance program. It is  not intended to diagnose MRSA infection nor to guide or monitor treatment for MRSA infections.      Studies: No results found.  Scheduled Meds: . antiseptic oral rinse  7 mL Mouth Rinse BID  . aspirin  325 mg Oral Daily  . atorvastatin  20 mg Oral q1800  . budesonide-formoterol  2 puff Inhalation BID  . carvedilol  6.25 mg Oral BID WC  . enoxaparin (LOVENOX) injection  40 mg Subcutaneous Q24H  . fluticasone  2 spray Each Nare Daily  . [START ON 07/22/2014] furosemide  40 mg Oral Daily  . insulin aspart  0-20 Units Subcutaneous TID WC  . insulin aspart  0-5 Units Subcutaneous QHS  . linagliptin  5 mg Oral Daily  . sodium chloride  3 mL Intravenous Q12H  . spironolactone  12.5 mg Oral Daily   Continuous Infusions:    Time spent: 35 minutes  [facility]  [email or web address], password [facility] 07/20/2014, 4:29 PM  LOS: 3 days

## 2014-07-21 ENCOUNTER — Encounter (HOSPITAL_COMMUNITY)
Admission: EM | Disposition: A | Discharge status: Home / Self Care | Payer: BLUE CROSS/BLUE SHIELD | Source: Home / Self Care | Attending: Internal Medicine

## 2014-07-21 ENCOUNTER — Encounter (HOSPITAL_COMMUNITY): Payer: Self-pay | Admitting: Cardiology

## 2014-07-21 DIAGNOSIS — I2510 Atherosclerotic heart disease of native coronary artery without angina pectoris: Secondary | ICD-10-CM

## 2014-07-21 DIAGNOSIS — N183 Chronic kidney disease, stage 3 (moderate): Secondary | ICD-10-CM

## 2014-07-21 DIAGNOSIS — I214 Non-ST elevation (NSTEMI) myocardial infarction: Secondary | ICD-10-CM

## 2014-07-21 HISTORY — PX: PERCUTANEOUS CORONARY STENT INTERVENTION (PCI-S): SHX5485

## 2014-07-21 HISTORY — PX: LEFT HEART CATHETERIZATION WITH CORONARY/GRAFT ANGIOGRAM: SHX5450

## 2014-07-21 LAB — CBC
HCT: 41.8 % (ref 39.0–52.0)
Hemoglobin: 13.9 g/dL (ref 13.0–17.0)
MCH: 35.2 pg — ABNORMAL HIGH (ref 26.0–34.0)
MCHC: 33.3 g/dL (ref 30.0–36.0)
MCV: 105.8 fL — ABNORMAL HIGH (ref 78.0–100.0)
PLATELETS: 224 10*3/uL (ref 150–400)
RBC: 3.95 MIL/uL — ABNORMAL LOW (ref 4.22–5.81)
RDW: 13.9 % (ref 11.5–15.5)
WBC: 7.2 10*3/uL (ref 4.0–10.5)

## 2014-07-21 LAB — LIPID PANEL
Cholesterol: 145 mg/dL (ref 0–200)
HDL: 35 mg/dL — AB (ref >39)
LDL CALC: 75 mg/dL (ref 0–99)
TRIGLYCERIDES: 173 mg/dL — AB (ref <150)
Total CHOL/HDL Ratio: 4.1 RATIO
VLDL: 35 mg/dL (ref 0–40)

## 2014-07-21 LAB — BASIC METABOLIC PANEL
Anion gap: 9 (ref 5–15)
BUN: 22 mg/dL (ref 6–23)
CHLORIDE: 104 meq/L (ref 96–112)
CO2: 26 mmol/L (ref 19–32)
CREATININE: 1.11 mg/dL (ref 0.50–1.35)
Calcium: 9.3 mg/dL (ref 8.4–10.5)
GFR calc Af Amer: 87 mL/min — ABNORMAL LOW (ref >90)
GFR calc non Af Amer: 75 mL/min — ABNORMAL LOW (ref >90)
Glucose, Bld: 170 mg/dL — ABNORMAL HIGH (ref 70–99)
POTASSIUM: 4 mmol/L (ref 3.5–5.1)
SODIUM: 139 mmol/L (ref 135–145)

## 2014-07-21 LAB — POCT ACTIVATED CLOTTING TIME: Activated Clotting Time: 362 s

## 2014-07-21 LAB — GLUCOSE, CAPILLARY
GLUCOSE-CAPILLARY: 150 mg/dL — AB (ref 70–99)
Glucose-Capillary: 157 mg/dL — ABNORMAL HIGH (ref 70–99)
Glucose-Capillary: 241 mg/dL — ABNORMAL HIGH (ref 70–99)
Glucose-Capillary: 163 mg/dL — ABNORMAL HIGH (ref 70–99)

## 2014-07-21 LAB — PROTIME-INR
INR: 1.02 (ref 0.00–1.49)
Prothrombin Time: 13.5 seconds (ref 11.6–15.2)

## 2014-07-21 SURGERY — LEFT HEART CATHETERIZATION WITH CORONARY/GRAFT ANGIOGRAM

## 2014-07-21 MED ORDER — SODIUM CHLORIDE 0.9 % IJ SOLN
3.0000 mL | Freq: Two times a day (BID) | INTRAMUSCULAR | Status: DC
  Administered 2014-07-21: 3 mL via INTRAVENOUS

## 2014-07-21 MED ORDER — ASPIRIN 81 MG PO CHEW
81.0000 mg | CHEWABLE_TABLET | Freq: Every day | ORAL | Status: DC
  Administered 2014-07-22: 81 mg via ORAL
  Filled 2014-07-21: qty 1

## 2014-07-21 MED ORDER — ONDANSETRON HCL 4 MG/2ML IJ SOLN: 4.0000 mg | Freq: Four times a day (QID) | INTRAMUSCULAR | Status: DC | PRN

## 2014-07-21 MED ORDER — BIVALIRUDIN 250 MG IV SOLR
INTRAVENOUS | Status: AC
  Filled 2014-07-21: qty 250

## 2014-07-21 MED ORDER — ENOXAPARIN SODIUM 40 MG/0.4ML ~~LOC~~ SOLN
40.0000 mg | SUBCUTANEOUS | Status: DC
  Filled 2014-07-21: qty 0.4

## 2014-07-21 MED ORDER — FUROSEMIDE 10 MG/ML IJ SOLN
40.0000 mg | Freq: Two times a day (BID) | INTRAMUSCULAR | Status: DC
  Administered 2014-07-21 – 2014-07-22 (×2): 40 mg via INTRAVENOUS
  Filled 2014-07-21 (×4): qty 4

## 2014-07-21 MED ORDER — CLOPIDOGREL BISULFATE 75 MG PO TABS
75.0000 mg | ORAL_TABLET | Freq: Every day | ORAL | Status: DC
  Administered 2014-07-22: 75 mg via ORAL
  Filled 2014-07-21 (×2): qty 1

## 2014-07-21 MED ORDER — SODIUM CHLORIDE 0.9 % IV SOLN: 250.0000 mL | INTRAVENOUS | Status: DC | PRN

## 2014-07-21 MED ORDER — FENTANYL CITRATE 0.05 MG/ML IJ SOLN
INTRAMUSCULAR | Status: AC
  Filled 2014-07-21: qty 2

## 2014-07-21 MED ORDER — LOSARTAN POTASSIUM 25 MG PO TABS
25.0000 mg | ORAL_TABLET | Freq: Two times a day (BID) | ORAL | Status: DC
  Filled 2014-07-21 (×2): qty 1

## 2014-07-21 MED ORDER — HEPARIN (PORCINE) IN NACL 2-0.9 UNIT/ML-% IJ SOLN
INTRAMUSCULAR | Status: AC
  Filled 2014-07-21: qty 1000

## 2014-07-21 MED ORDER — NITROGLYCERIN 1 MG/10 ML FOR IR/CATH LAB
INTRA_ARTERIAL | Status: AC
  Filled 2014-07-21: qty 10

## 2014-07-21 MED ORDER — LIDOCAINE HCL (PF) 1 % IJ SOLN
INTRAMUSCULAR | Status: AC
  Filled 2014-07-21: qty 30

## 2014-07-21 MED ORDER — SODIUM CHLORIDE 0.9 % IV SOLN
1.7500 mg/kg/h | INTRAVENOUS | Status: DC
  Administered 2014-07-21: 1.75 mg/kg/h via INTRAVENOUS

## 2014-07-21 MED ORDER — ASPIRIN 81 MG PO CHEW: 81.0000 mg | CHEWABLE_TABLET | ORAL | Status: DC

## 2014-07-21 MED ORDER — MIDAZOLAM HCL 2 MG/2ML IJ SOLN
INTRAMUSCULAR | Status: AC
  Filled 2014-07-21: qty 2

## 2014-07-21 MED ORDER — SODIUM CHLORIDE 0.9 % IV SOLN
INTRAVENOUS | Status: DC
Start: 2014-07-21 — End: 2014-07-22
  Administered 2014-07-21: 16:00:00 via INTRAVENOUS

## 2014-07-21 MED ORDER — METOPROLOL TARTRATE 1 MG/ML IV SOLN
INTRAVENOUS | Status: AC
  Filled 2014-07-21: qty 5

## 2014-07-21 MED ORDER — SODIUM CHLORIDE 0.9 % IJ SOLN: 3.0000 mL | INTRAMUSCULAR | Status: DC | PRN

## 2014-07-21 MED ORDER — ACETAMINOPHEN 325 MG PO TABS
650.0000 mg | ORAL_TABLET | ORAL | Status: DC | PRN
  Administered 2014-07-21: 650 mg via ORAL
  Filled 2014-07-21: qty 2

## 2014-07-21 NOTE — CV Procedure (Signed)
   Cardiac Catheterization Procedure Note  Name: Epifanio Labrador MRN: [id number] DOB: 24-Aug-1960  Procedure: Left Heart Cath, Selective Coronary Angiography, LIMA angiography, SVG angiography.   Indication: Acute systolic CHF, significant fall in LV EF, elevated troponin.    Procedural details: The right groin was prepped, draped, and anesthetized with 1% lidocaine. Using modified Seldinger technique, a 5 French sheath was introduced into the right femoral artery. Standard Judkins catheters were used for coronary angiography and left ventriculography. Catheter exchanges were performed over a guidewire. There were no immediate procedural complications. The patient was transferred to the post catheterization recovery area for further monitoring.  Procedural Findings: Hemodynamics:  AO 126/83 LV 119/33   Coronary angiography: Coronary dominance: right  Left mainstem: Minimal disease.   Left anterior descending (LAD): 80% mid LAD at D2.  Small-moderate D2 with 80% ostial stenosis. Large D1 with long 80% proximal stenosis.  Patent LIMA-LAD.  Patent SVG-large D1.   Left circumflex (LCx): 80% proximal stenosis proximal to the take-off of a large PLOM.  There is a long up to 99% stenosed area in the proximal PLOM.  Total occlusion of SVG-PLOM.   Right coronary artery (RCA): Total occlusion of the proximal RCA.  There are left to right collaterals that appear to primarily come from the LAD system. Total occlusion of SVG-RCA system.   Left ventriculography: Not done, elevated LVEDP and recent echo.   Final Conclusions:  2/4 patent grafts with SVG-PLOM and SVG-RCA system totally occluded.  There are collaterals from the LAD system to the RCA and the RCA is totally occluded proximally.  There are no collaterals to the LCx territory.  Given significant fall in LV systolic function, will plan PCI to LCx and PLOM.  Dr Irish Lack to perform.   Recommendations: After PCI, will need repeat echo in 3 months  to assess for ICD.    With elevated LV EDP, will restart IV Lasix.   Loralie Champagne 07/21/2014, 12:28 PM

## 2014-07-21 NOTE — CV Procedure (Signed)
PROCEDURE:  PCI Left circumflex  INDICATIONS:  Acute systolic heart failure, elevated troponin, bypass graft closure, NSTEMI  The risks, benefits, and details of the procedure were explained to the patient.  The patient verbalized understanding and wanted to proceed.  Informed written consent was obtained.  PROCEDURE TECHNIQUE:  The diagnostic catheterization was performed by Dr. [name]. This revealed severe disease in the native circumflex, up to 99% in the mid circumflex. There was a significant lesion more proximal as well. The segment with the 99% stenosis was a long segment of diffuse disease. Angiomax was used for anticoagulation. ACT was used to check that the Angiomax was therapeutic. A fielder XT wire was placed across the area of severe disease in the mid circumflex.  A 2.0 x 20 balloon was used to predilate. A 2.25 x 38 Synergy stent was then deployed across the more distal area disease. A 2.5 x 16 Synergy stent was deployed in overlapping fashion across the more proximal area disease. The distal stent was postdilated with a 2.5 x 12 noncompliant balloon. The more proximal area was postdilated with a 3.0 x 12 noncompliant balloon to high pressure. Several doses of nitroglycerin were administered intracoronary. The patient tolerated the procedure well. There was an excellent angiographic appearance. A vascade closure device was deployed for hemostasis.    CONTRAST:  Total of 95 cc.  COMPLICATIONS:  None.     IMPRESSIONS:  1. Successful PCI of the mid left circumflex artery with overlapping Synergy drug-eluting stents as noted above.   RECOMMENDATION:  Continue dual antiplatelet therapy for at least a year. He will be watched overnight. We'll continue Angiomax since he only got the Plavix while he was on the cath table. Watch for any groin bleeding. Continue diuresis per Dr. [name].

## 2014-07-21 NOTE — Progress Notes (Addendum)
Patient ID: Vincent Chandler, male   DOB: 28-Jun-1961, 54 y.o.   MRN: [id number]   SUBJECTIVE: No dyspnea now.  Feels good.  Never had chest pain.  Echo this admission showed fall in EF from 40% in 9/14 to 20-25%.    Scheduled Meds: . antiseptic oral rinse  7 mL Mouth Rinse BID  . [START ON 07/22/2014] aspirin  81 mg Oral Pre-Cath  . aspirin  325 mg Oral Daily  . atorvastatin  20 mg Oral q1800  . budesonide-formoterol  2 puff Inhalation BID  . carvedilol  6.25 mg Oral BID WC  . enoxaparin (LOVENOX) injection  40 mg Subcutaneous Q24H  . fluticasone  2 spray Each Nare Daily  . [START ON 07/22/2014] furosemide  40 mg Oral Daily  . insulin aspart  0-20 Units Subcutaneous TID WC  . insulin aspart  0-5 Units Subcutaneous QHS  . linagliptin  5 mg Oral Daily  . losartan  25 mg Oral BID  . sodium chloride  3 mL Intravenous Q12H  . sodium chloride  3 mL Intravenous Q12H  . spironolactone  12.5 mg Oral Daily   Continuous Infusions:  PRN Meds:.sodium chloride, sodium chloride, acetaminophen **OR** acetaminophen, alum & mag hydroxide-simeth, guaiFENesin, HYDROmorphone (DILAUDID) injection, ondansetron **OR** ondansetron (ZOFRAN) IV, oxyCODONE, sodium chloride, sodium chloride    Filed Vitals:   07/20/14 1947 07/21/14 0300 07/21/14 0400 07/21/14 0824  BP:  129/83 129/83 121/79  Pulse:  83  87  Temp:  97.7 F (36.5 C)  97.7 F (36.5 C)  TempSrc:  Oral  Oral  Resp:  18    Height:      Weight:  221 lb 5.5 oz (100.4 kg)    SpO2: 93% 93%      Intake/Output Summary (Last 24 hours) at 07/21/14 1027 Last data filed at 07/20/14 1800  Gross per 24 hour  Intake    660 ml  Output      0 ml  Net    660 ml    LABS: Basic Metabolic Panel:  Recent Labs  07/20/14 0235 07/21/14 0210  NA 136 139  K 4.1 4.0  CL 105 104  CO2 26 26  GLUCOSE 187* 170*  BUN 25* 22  CREATININE 1.19 1.11  CALCIUM 9.2 9.3   Liver Function Tests: No results for input(s): AST, ALT, ALKPHOS, BILITOT, PROT, ALBUMIN in  the last 72 hours. No results for input(s): LIPASE, AMYLASE in the last 72 hours. CBC:  Recent Labs  07/21/14 0210  WBC 7.2  HGB 13.9  HCT 41.8  MCV 105.8*  PLT 224   Cardiac Enzymes:  Recent Labs  07/18/14 1045  TROPONINI 0.05*   BNP: Invalid input(s): POCBNP D-Dimer: No results for input(s): DDIMER in the last 72 hours. Hemoglobin A1C: No results for input(s): HGBA1C in the last 72 hours. Fasting Lipid Panel:  Recent Labs  07/21/14 0210  CHOL 145  HDL 35*  LDLCALC 75  TRIG 173*  CHOLHDL 4.1   Thyroid Function Tests: No results for input(s): TSH, T4TOTAL, T3FREE, THYROIDAB in the last 72 hours.  Invalid input(s): FREET3 Anemia Panel: No results for input(s): VITAMINB12, FOLATE, FERRITIN, TIBC, IRON, RETICCTPCT in the last 72 hours.  RADIOLOGY: Dg Chest 2 View  07/17/2014   CLINICAL DATA:  Acute onset of cough, wheezing and hypoxemia. Current history of asthma. Prior CABG. Obstructive sleep apnea requiring CPAP.  EXAM: CHEST  2 VIEW  COMPARISON:  06/13/2013 dating back to 02/16/2012.  FINDINGS: Sternotomy for CABG. Cardiac  silhouette mildly enlarged. Thoracic aorta mildly atherosclerotic, unchanged. Hilar and mediastinal contours otherwise unremarkable. Pulmonary venous hypertension with minimal interstitial pulmonary edema as evidenced by scattered Kerley B-lines. Small bilateral pleural effusions suspected. No confluent airspace consolidation. No pneumothorax. Visualized bony thorax intact.  IMPRESSION: Minimal CHF, with stable mild cardiomegaly and minimal interstitial pulmonary edema. Small bilateral pleural effusions.   Electronically Signed   By: Evangeline Dakin M.D.   On: 07/17/2014 16:08   Ct Angio Chest Pe W/cm &/or Wo Cm  07/18/2014   CLINICAL DATA:  Acute onset of shortness of breath and generalized chest pain. Initial encounter.  EXAM: CT ANGIOGRAPHY CHEST WITH CONTRAST  TECHNIQUE: Multidetector CT imaging of the chest was performed using the standard  protocol during bolus administration of intravenous contrast. Multiplanar CT image reconstructions and MIPs were obtained to evaluate the vascular anatomy.  CONTRAST:  75 mL of Omnipaque 350 IV contrast  COMPARISON:  Chest radiograph performed 07/17/2014  FINDINGS: There is no evidence of significant pulmonary embolus. Evaluation for pulmonary embolus is mildly suboptimal due to motion artifact.  Trace bilateral pleural effusions are noted, with hazy bilateral airspace opacities, compatible with pulmonary edema. Underlying interstitial prominence is seen. There is no evidence of pneumothorax. No masses are identified; no abnormal focal contrast enhancement is seen.  The heart is mildly enlarged. The patient is status post median sternotomy. Scattered coronary artery calcifications are seen. Visualized mediastinal nodes remain normal in size. No pericardial effusion is identified. Incidental note is made of a retroesophageal right subclavian artery. The great vessels are otherwise grossly unremarkable. No axillary lymphadenopathy is seen. The visualized portions of the thyroid gland are unremarkable in appearance.  The visualized portions of the liver and spleen are unremarkable. The visualized portions of the pancreas, gallbladder, stomach and adrenal glands are within normal limits. Minimal nonspecific perinephric stranding is noted bilaterally.  No acute osseous abnormalities are seen.  Review of the MIP images confirms the above findings.  IMPRESSION: 1. No evidence of significant pulmonary embolus. 2. Trace bilateral pleural effusions, with hazy bilateral airspace opacities, compatible with pulmonary edema. Underlying interstitial prominence seen. 3. Mild cardiomegaly. 4. Scattered coronary artery calcifications noted.   Electronically Signed   By: Garald Balding M.D.   On: 07/18/2014 03:07    PHYSICAL EXAM General: NAD Neck: Thick, no JVD, no thyromegaly or thyroid nodule.  Lungs: Clear to auscultation  bilaterally with normal respiratory effort. CV: Nondisplaced PMI.  Heart regular S1/S2, no S3/S4, no murmur.  No peripheral edema.  No carotid bruit.  Normal pedal pulses.  Abdomen: Soft, nontender, no hepatosplenomegaly, no distention.  Neurologic: Alert and oriented x 3.  Psych: Normal affect. Extremities: No clubbing or cyanosis.   TELEMETRY: Reviewed telemetry pt in NSR  ASSESSMENT AND PLAN: 54 yo with history of CAD s/p CABG and ischemic CMP was admitted with acute on chronic systolic CHF.   1. Acute on chronic systolic CHF: Ischemic cardiomyopathy.  EF 20-25% by echo, this is lower than in the past (40% in 9/14).  He was not marked symptomatic but had low oxygen saturation when evaluated by PCP. Some dyspnea, some atypical chest pain.  He was diuresed with IV Lasix then transitioned to po.  Says he is breathing well.  Exam difficult for volume but does not appear significantly volume overloaded currently.  - Continue Coreg and spironolactone.  - Continue po Lasix (hold am dose for cath).  - Will resume losartan today after cardiac cath.  2. CAD: s/p CABG.  Atypical chest pain prior to admission.  Mild increase in troponin to peak 0.07, possibly this is due to demand ischemia with volume overload.  However, EF has fallen from 40% to 20-25%.  Concern for progression of CAD.  Creatinine stable today.  - Cardiac cath today.   - Continue ASA, statin. LDL ok.  3. AKI: With diuresis.  Creatinine now back down to baseline.  4. OSA: Needs to resume CPAP use.  Will need followup with pulmonary.  5. Disposition: May go home on current cardiac meds with cardiology followup if no intervention on cardiac cath today.   Loralie Champagne 07/21/2014 10:27 AM

## 2014-07-21 NOTE — Interval H&P Note (Signed)
History and Physical Interval Note:  07/21/2014 11:32 AM  Vincent Chandler  has presented today for surgery, with the diagnosis of cp  The various methods of treatment have been discussed with the patient and family. After consideration of risks, benefits and other options for treatment, the patient has consented to  Procedure(s): LEFT HEART CATHETERIZATION WITH CORONARY/GRAFT ANGIOGRAM (N/A) as a surgical intervention .  The patient's history has been reviewed, patient examined, no change in status, stable for surgery.  I have reviewed the patient's chart and labs.  Questions were answered to the patient's satisfaction.     Hady Niemczyk [employer]

## 2014-07-21 NOTE — H&P (View-Only) (Signed)
Patient ID: Vincent Chandler, male   DOB: 08/26/1960, 54 y.o.   MRN: [id number]   SUBJECTIVE: No dyspnea now.  Feels good.  Never had chest pain.  Echo this admission showed fall in EF from 40% in 9/14 to 20-25%.    Scheduled Meds: . antiseptic oral rinse  7 mL Mouth Rinse BID  . [START ON 07/22/2014] aspirin  81 mg Oral Pre-Cath  . aspirin  325 mg Oral Daily  . atorvastatin  20 mg Oral q1800  . budesonide-formoterol  2 puff Inhalation BID  . carvedilol  6.25 mg Oral BID WC  . enoxaparin (LOVENOX) injection  40 mg Subcutaneous Q24H  . fluticasone  2 spray Each Nare Daily  . [START ON 07/22/2014] furosemide  40 mg Oral Daily  . insulin aspart  0-20 Units Subcutaneous TID WC  . insulin aspart  0-5 Units Subcutaneous QHS  . linagliptin  5 mg Oral Daily  . losartan  25 mg Oral BID  . sodium chloride  3 mL Intravenous Q12H  . sodium chloride  3 mL Intravenous Q12H  . spironolactone  12.5 mg Oral Daily   Continuous Infusions:  PRN Meds:.sodium chloride, sodium chloride, acetaminophen **OR** acetaminophen, alum & mag hydroxide-simeth, guaiFENesin, HYDROmorphone (DILAUDID) injection, ondansetron **OR** ondansetron (ZOFRAN) IV, oxyCODONE, sodium chloride, sodium chloride    Filed Vitals:   07/20/14 1947 07/21/14 0300 07/21/14 0400 07/21/14 0824  BP:  129/83 129/83 121/79  Pulse:  83  87  Temp:  97.7 F (36.5 C)  97.7 F (36.5 C)  TempSrc:  Oral  Oral  Resp:  18    Height:      Weight:  221 lb 5.5 oz (100.4 kg)    SpO2: 93% 93%      Intake/Output Summary (Last 24 hours) at 07/21/14 1027 Last data filed at 07/20/14 1800  Gross per 24 hour  Intake    660 ml  Output      0 ml  Net    660 ml    LABS: Basic Metabolic Panel:  Recent Labs  07/20/14 0235 07/21/14 0210  NA 136 139  K 4.1 4.0  CL 105 104  CO2 26 26  GLUCOSE 187* 170*  BUN 25* 22  CREATININE 1.19 1.11  CALCIUM 9.2 9.3   Liver Function Tests: No results for input(s): AST, ALT, ALKPHOS, BILITOT, PROT, ALBUMIN in  the last 72 hours. No results for input(s): LIPASE, AMYLASE in the last 72 hours. CBC:  Recent Labs  07/21/14 0210  WBC 7.2  HGB 13.9  HCT 41.8  MCV 105.8*  PLT 224   Cardiac Enzymes:  Recent Labs  07/18/14 1045  TROPONINI 0.05*   BNP: Invalid input(s): POCBNP D-Dimer: No results for input(s): DDIMER in the last 72 hours. Hemoglobin A1C: No results for input(s): HGBA1C in the last 72 hours. Fasting Lipid Panel:  Recent Labs  07/21/14 0210  CHOL 145  HDL 35*  LDLCALC 75  TRIG 173*  CHOLHDL 4.1   Thyroid Function Tests: No results for input(s): TSH, T4TOTAL, T3FREE, THYROIDAB in the last 72 hours.  Invalid input(s): FREET3 Anemia Panel: No results for input(s): VITAMINB12, FOLATE, FERRITIN, TIBC, IRON, RETICCTPCT in the last 72 hours.  RADIOLOGY: Dg Chest 2 View  07/17/2014   CLINICAL DATA:  Acute onset of cough, wheezing and hypoxemia. Current history of asthma. Prior CABG. Obstructive sleep apnea requiring CPAP.  EXAM: CHEST  2 VIEW  COMPARISON:  06/13/2013 dating back to 02/16/2012.  FINDINGS: Sternotomy for CABG. Cardiac  silhouette mildly enlarged. Thoracic aorta mildly atherosclerotic, unchanged. Hilar and mediastinal contours otherwise unremarkable. Pulmonary venous hypertension with minimal interstitial pulmonary edema as evidenced by scattered Kerley B-lines. Small bilateral pleural effusions suspected. No confluent airspace consolidation. No pneumothorax. Visualized bony thorax intact.  IMPRESSION: Minimal CHF, with stable mild cardiomegaly and minimal interstitial pulmonary edema. Small bilateral pleural effusions.   Electronically Signed   By: Evangeline Dakin M.D.   On: 07/17/2014 16:08   Ct Angio Chest Pe W/cm &/or Wo Cm  07/18/2014   CLINICAL DATA:  Acute onset of shortness of breath and generalized chest pain. Initial encounter.  EXAM: CT ANGIOGRAPHY CHEST WITH CONTRAST  TECHNIQUE: Multidetector CT imaging of the chest was performed using the standard  protocol during bolus administration of intravenous contrast. Multiplanar CT image reconstructions and MIPs were obtained to evaluate the vascular anatomy.  CONTRAST:  75 mL of Omnipaque 350 IV contrast  COMPARISON:  Chest radiograph performed 07/17/2014  FINDINGS: There is no evidence of significant pulmonary embolus. Evaluation for pulmonary embolus is mildly suboptimal due to motion artifact.  Trace bilateral pleural effusions are noted, with hazy bilateral airspace opacities, compatible with pulmonary edema. Underlying interstitial prominence is seen. There is no evidence of pneumothorax. No masses are identified; no abnormal focal contrast enhancement is seen.  The heart is mildly enlarged. The patient is status post median sternotomy. Scattered coronary artery calcifications are seen. Visualized mediastinal nodes remain normal in size. No pericardial effusion is identified. Incidental note is made of a retroesophageal right subclavian artery. The great vessels are otherwise grossly unremarkable. No axillary lymphadenopathy is seen. The visualized portions of the thyroid gland are unremarkable in appearance.  The visualized portions of the liver and spleen are unremarkable. The visualized portions of the pancreas, gallbladder, stomach and adrenal glands are within normal limits. Minimal nonspecific perinephric stranding is noted bilaterally.  No acute osseous abnormalities are seen.  Review of the MIP images confirms the above findings.  IMPRESSION: 1. No evidence of significant pulmonary embolus. 2. Trace bilateral pleural effusions, with hazy bilateral airspace opacities, compatible with pulmonary edema. Underlying interstitial prominence seen. 3. Mild cardiomegaly. 4. Scattered coronary artery calcifications noted.   Electronically Signed   By: Garald Balding M.D.   On: 07/18/2014 03:07    PHYSICAL EXAM General: NAD Neck: Thick, no JVD, no thyromegaly or thyroid nodule.  Lungs: Clear to auscultation  bilaterally with normal respiratory effort. CV: Nondisplaced PMI.  Heart regular S1/S2, no S3/S4, no murmur.  No peripheral edema.  No carotid bruit.  Normal pedal pulses.  Abdomen: Soft, nontender, no hepatosplenomegaly, no distention.  Neurologic: Alert and oriented x 3.  Psych: Normal affect. Extremities: No clubbing or cyanosis.   TELEMETRY: Reviewed telemetry pt in NSR  ASSESSMENT AND PLAN: 54 yo with history of CAD s/p CABG and ischemic CMP was admitted with acute on chronic systolic CHF.   1. Acute on chronic systolic CHF: Ischemic cardiomyopathy.  EF 20-25% by echo, this is lower than in the past (40% in 9/14).  He was not marked symptomatic but had low oxygen saturation when evaluated by PCP. Some dyspnea, some atypical chest pain.  He was diuresed with IV Lasix then transitioned to po.  Says he is breathing well.  Exam difficult for volume but does not appear significantly volume overloaded currently.  - Continue Coreg and spironolactone.  - Continue po Lasix (hold am dose for cath).  - Will resume losartan today after cardiac cath.  2. CAD: s/p CABG.  Atypical chest pain prior to admission.  Mild increase in troponin to peak 0.07, possibly this is due to demand ischemia with volume overload.  However, EF has fallen from 40% to 20-25%.  Concern for progression of CAD.  Creatinine stable today.  - Cardiac cath today.   - Continue ASA, statin. LDL ok.  3. AKI: With diuresis.  Creatinine now back down to baseline.  4. OSA: Needs to resume CPAP use.  Will need followup with pulmonary.  5. Disposition: May go home on current cardiac meds with cardiology followup if no intervention on cardiac cath today.   Loralie Champagne 07/21/2014 10:27 AM

## 2014-07-21 NOTE — Progress Notes (Signed)
[facility] PROGRESS NOTE  Devonne Lalani QVZ:[id number] DOB: Oct 29, 1960 DOA: 07/17/2014 PCP: Garnet Koyanagi, DO  summary 54 yo male ran out of medications several days PTA, presented to ED with hypertensive emergency and acute CHF, respiratory failure.  Also reported wide complex tachycardia in ED.  Antihypertensives resumed and pressure dropped transiently after admission.  Creatinine increased slightly. Antihypertensives and diuretics adjusted accordingly.    Assessment/Plan:     Acute combined systolic and diastolic congestive heart failure: Improving clinically.  Echo shows worse EF.  20-25%. For cath     Diabetes mellitus type II, uncontrolled:  Metformin held. Resumed Januvia. Continue sliding scale. Hemoglobin A1c 9.9. Consider adding sulfonylurea or other at discharge   Ventricular tachycardia:  None further. lytes ok   Hypertensive emergency:  Ran out of meds.  Cozaar held due to creatine increase and hypotension. Decreased carvedilol dose.  Lasix.    Chronic coronary artery disease:  No chest pain.   Mixed hyperlipidemia:    Cardiomyopathy, ischemic   Acute respiratory failure with hypoxia:  Secondary to heart failure. CT angiogram of the chest negative for PE   Elevated troponin:  Doubt acute coronary syndrome. Likely from heart failure hypoxia, etc.   Elevated d-dimer:  No PE.    OSA (obstructive sleep apnea):  Reports noncompliance with C Pap since CABG.  Code Status:  full Family Communication:   Disposition Plan:    Consultants:  Cardiology  Procedures:     Antibiotics:    HPI/Subjective: Patient says he is going for cath at 1 PM today- will make NPO in case that is correct  Objective: Filed Vitals:   07/21/14 0400  BP: 129/83  Pulse:   Temp:   Resp:     Intake/Output Summary (Last 24 hours) at 07/21/14 0803 Last data filed at 07/20/14 1800  Gross per 24 hour  Intake   1020 ml  Output      0 ml  Net   1020 ml   Filed Weights   07/19/14  0300 07/20/14 0404 07/21/14 0300  Weight: 101.5 kg (223 lb 12.3 oz) 102.4 kg (225 lb 12 oz) 100.4 kg (221 lb 5.5 oz)    Exam:   General:  In chair. NAD  Cardiovascular: Regular rate rhythm without murmurs gallops rubs  Respiratory: Clear to Auscultation bilaterally without wheezes rhonchi or rales  Abdomen: Soft nontender nondistended. Normal bowel sounds.  Ext: Trace edema.  Basic Metabolic Panel:  Recent Labs Lab 07/17/14 1545 07/17/14 2307 07/18/14 0525 07/19/14 0333 07/20/14 0235 07/21/14 0210  NA  --  138 138 138 136 139  K  --  3.9 3.5 4.3 4.1 4.0  CL  --  101 102 106 105 104  CO2  --  _0 GLUCOSE  --  192* 215* 200* 187* 170*  BUN  --  11 13 28* 25* 22  CREATININE  --  0.97 1.04 1.50* 1.19 1.11  CALCIUM  --  9.0 8.8 9.1 9.2 9.3  MG 1.9  --   --   --   --   --    Liver Function Tests:  Recent Labs Lab 07/17/14 2307  AST 24  ALT 22  ALKPHOS 64  BILITOT 1.4*  PROT 7.3  ALBUMIN 3.5   No results for input(s): LIPASE, AMYLASE in the last 168 hours. No results for input(s): AMMONIA in the last 168 hours. CBC:  Recent Labs Lab 07/17/14 1545 07/21/14 0210  WBC 7.3 7.2  NEUTROABS 5.4  --  HGB 14.9 13.9  HCT 44.6 41.8  MCV 105.7* 105.8*  PLT 254 224   Cardiac Enzymes:  Recent Labs Lab 07/17/14 1545 07/17/14 2307 07/18/14 0525 07/18/14 1045  TROPONINI 0.04* 0.07* 0.05* 0.05*   BNP (last 3 results) No results for input(s): PROBNP in the last 8760 hours. CBG:  Recent Labs Lab 07/19/14 2134 07/20/14 0827 07/20/14 1121 07/20/14 1739 07/20/14 2028  GLUCAP 184* 168* 244* 187* 151*    Recent Results (from the past 240 hour(s))  MRSA PCR Screening     Status: None   Collection Time: 07/17/14  9:05 PM  Result Value Ref Range Status   MRSA by PCR NEGATIVE NEGATIVE Final    Comment:        The GeneXpert MRSA Assay (FDA approved for NASAL specimens only), is one component of a comprehensive MRSA colonization surveillance  program. It is not intended to diagnose MRSA infection nor to guide or monitor treatment for MRSA infections.      Studies: No results found.  Scheduled Meds: . antiseptic oral rinse  7 mL Mouth Rinse BID  . [START ON 07/22/2014] aspirin  81 mg Oral Pre-Cath  . aspirin  325 mg Oral Daily  . atorvastatin  20 mg Oral q1800  . budesonide-formoterol  2 puff Inhalation BID  . carvedilol  6.25 mg Oral BID WC  . enoxaparin (LOVENOX) injection  40 mg Subcutaneous Q24H  . fluticasone  2 spray Each Nare Daily  . [START ON 07/22/2014] furosemide  40 mg Oral Daily  . insulin aspart  0-20 Units Subcutaneous TID WC  . insulin aspart  0-5 Units Subcutaneous QHS  . linagliptin  5 mg Oral Daily  . sodium chloride  3 mL Intravenous Q12H  . sodium chloride  3 mL Intravenous Q12H  . spironolactone  12.5 mg Oral Daily   Continuous Infusions:    Time spent: 35 minutes  Genavive Kubicki  [facility] [phone]  [email or web address], password [facility] 07/21/2014, 8:03 AM  LOS: 4 days

## 2014-07-22 ENCOUNTER — Other Ambulatory Visit: Payer: Self-pay | Admitting: Physician Assistant

## 2014-07-22 DIAGNOSIS — N182 Chronic kidney disease, stage 2 (mild): Secondary | ICD-10-CM

## 2014-07-22 LAB — BASIC METABOLIC PANEL
Anion gap: 7 (ref 5–15)
BUN: 22 mg/dL (ref 6–23)
CALCIUM: 9.7 mg/dL (ref 8.4–10.5)
CO2: 31 mmol/L (ref 19–32)
Chloride: 102 mEq/L (ref 96–112)
Creatinine, Ser: 1.33 mg/dL (ref 0.50–1.35)
GFR calc Af Amer: 70 mL/min — ABNORMAL LOW (ref >90)
GFR, EST NON AFRICAN AMERICAN: 60 mL/min — AB (ref >90)
Glucose, Bld: 164 mg/dL — ABNORMAL HIGH (ref 70–99)
POTASSIUM: 4.1 mmol/L (ref 3.5–5.1)
SODIUM: 140 mmol/L (ref 135–145)

## 2014-07-22 LAB — CBC
HCT: 44.2 % (ref 39.0–52.0)
Hemoglobin: 14.7 g/dL (ref 13.0–17.0)
MCH: 35.7 pg — AB (ref 26.0–34.0)
MCHC: 33.3 g/dL (ref 30.0–36.0)
MCV: 107.3 fL — AB (ref 78.0–100.0)
PLATELETS: 228 10*3/uL (ref 150–400)
RBC: 4.12 MIL/uL — ABNORMAL LOW (ref 4.22–5.81)
RDW: 14 % (ref 11.5–15.5)
WBC: 7.2 10*3/uL (ref 4.0–10.5)

## 2014-07-22 LAB — PLATELET INHIBITION P2Y12: Platelet Function  P2Y12: 191 [PRU] — ABNORMAL LOW (ref 194–418)

## 2014-07-22 LAB — GLUCOSE, CAPILLARY: Glucose-Capillary: 182 mg/dL — ABNORMAL HIGH (ref 70–99)

## 2014-07-22 MED ORDER — CLOPIDOGREL BISULFATE 75 MG PO TABS: 75.0000 mg | ORAL_TABLET | Freq: Every day | ORAL | Status: DC

## 2014-07-22 MED ORDER — FUROSEMIDE 40 MG PO TABS: 20.0000 mg | ORAL_TABLET | Freq: Every day | ORAL | Status: DC

## 2014-07-22 MED ORDER — SPIRONOLACTONE 25 MG PO TABS: 25.0000 mg | ORAL_TABLET | Freq: Every day | ORAL | Status: DC

## 2014-07-22 MED ORDER — CARVEDILOL 6.25 MG PO TABS: 6.2500 mg | ORAL_TABLET | Freq: Two times a day (BID) | ORAL | Status: DC

## 2014-07-22 MED ORDER — LOSARTAN POTASSIUM 25 MG PO TABS: 25.0000 mg | ORAL_TABLET | Freq: Every day | ORAL | Status: DC

## 2014-07-22 MED ORDER — LOSARTAN POTASSIUM 25 MG PO TABS
25.0000 mg | ORAL_TABLET | Freq: Every day | ORAL | Status: DC
  Administered 2014-07-22: 25 mg via ORAL
  Filled 2014-07-22: qty 1

## 2014-07-22 MED ORDER — SPIRONOLACTONE 25 MG PO TABS
25.0000 mg | ORAL_TABLET | Freq: Every day | ORAL | Status: DC
  Administered 2014-07-22: 25 mg via ORAL
  Filled 2014-07-22: qty 1

## 2014-07-22 MED ORDER — LOSARTAN POTASSIUM 25 MG PO TABS
25.0000 mg | ORAL_TABLET | Freq: Every day | ORAL | Status: DC
  Filled 2014-07-22: qty 1

## 2014-07-22 MED FILL — Sodium Chloride IV Soln 0.9%: INTRAVENOUS | Qty: 50 | Status: AC

## 2014-07-22 NOTE — Discharge Summary (Signed)
Physician Discharge Summary      Cardiologist:  [name]  Patient ID: Vincent Chandler MRN: [id number] DOB/AGE: [birth year] 54 y.o.  Admit date: 07/17/2014 Discharge date: 07/22/2014  Admission Diagnoses:  Acute combined systolic and diastolic congestive heart failure  Discharge Diagnoses:  Principal Problem:   Acute combined systolic and diastolic congestive heart failure Active Problems:   Diabetes mellitus type II, uncontrolled   V tach   Hypertensive emergency   Chronic coronary artery disease   Mixed hyperlipidemia   Cardiomyopathy, ischemic   Acute respiratory failure with hypoxia   Elevated troponin   Elevated d-dimer   NSTEMI (non-ST elevated myocardial infarction)   OSA (obstructive sleep apnea)   CKD (chronic kidney disease) 2-3   Discharged Condition: stable  Hospital Course:   This is a 54 y.o. male with known history of CAD s/p 4 vessel CABG and risk factors (hypertension, diabetes mellitus, obesity, OSA and hyperlipidemia), post cabg afib, anxiety, kidney stones, on CPAP, asthma, pneumonia x 3 who presented with acute respiratory distress and VT.  Patient is admitted under [facility] and is being managed for acute respiratory failure thought to be due to CHF.He apparently had run out of several of his medications.   Patient was in his usual state of health when he developed respiratory distress at home 1 day ago. He went to his PCP today where due to his hypoxia on the pulse ox in 80s he was sent to [facility] ED for evaluation. There he was found to have VT with 45 beats which he is not aware of and thus was transferred to [facility] for further care. He presented with mild CHF and had a 45 beat VT in the ED witnessed by the RN. Patient did not have syncope associated with it. Currently he has dyspnea, paroxysmal nocturnal dyspnea, orthopnea but no leg swelling present. He also has elevated BP. He has not been compliant with the salt and fluid  restriction. Of note, he had an episode of URI 1 month ago and had some productive cough associated with it. He was given levaquin which did not improve his symptoms.   He was started on IV diuretics which resulted in net fluid loss of 3.8L.  He was changed to PO lasix 40QAM a 20QPM at discharge.  Troponin was elevated at 0.07.  Echo revealed that his EF had dropped further to 20-25%.  G3DD. Severe diffuse hypokinesis. On 1/16 the patient's BP dropped to 72/47 after receiving 2m IV l;asix and coreg.  He was given a 2514mbolus of NS.  He underwent left heart cath revealing 2/4 patent grafts with SVG-PLOM and SVG-RCA system totally occluded. There are collaterals from the LAD system to the RCA and the RCA is totally occluded proximally. There are no collaterals to the LCx territory.  He then underwent successful PCI of the mid left circumflex artery with overlapping Synergy drug-eluting stents(See cath reports below).  He was placed on plavix.  P2Y12 showed the plavix was working appropriately.  Repeat echo planned for three months.  If no improvement in LVEF, he will need consideration for ICD.  The patient was seen by Dr. McAundra Dubinho felt he was stable for DC home.  Continue Coreg  Increase spironolactone to 25 mg daily. Restart losartan 25 mg daily.  Plavix.  lipitor 20. Coreg 6.2512mID. He was asked to resume CPAP.  BMET next week before FU appt.    Consults: Cardiology  Significant Diagnostic Studies: Echocardiogram Study Conclusions  -  Left ventricle: The cavity size was mildly dilated. Systolic function was severely reduced. The estimated ejection fraction was in the range of 20% to 25%. Severe diffuse hypokinesis. Although no diagnostic regional wall motion abnormality was identified, this possibility cannot be completely excluded on the basis of this study. Doppler parameters are consistent with a reversible restrictive pattern, indicative of decreased left ventricular  diastolic compliance and/or increased left atrial pressure (grade 3 diastolic dysfunction). - Left atrium: The atrium was mildly to moderately dilated. Cardiac Catheterization Procedure Note  Name: Vincent Chandler MRN: [id number] DOB: 08/13/1960  Procedure: Left Heart Cath, Selective Coronary Angiography, LIMA angiography, SVG angiography.   Indication: Acute systolic CHF, significant fall in LV EF, elevated troponin.   Procedural details: The right groin was prepped, draped, and anesthetized with 1% lidocaine. Using modified Seldinger technique, a 5 French sheath was introduced into the right femoral artery. Standard Judkins catheters were used for coronary angiography and left ventriculography. Catheter exchanges were performed over a guidewire. There were no immediate procedural complications. The patient was transferred to the post catheterization recovery area for further monitoring.  Procedural Findings: Hemodynamics:  AO 126/83 LV 119/33  Coronary angiography: Coronary dominance: right  Left mainstem: Minimal disease.   Left anterior descending (LAD): 80% mid LAD at D2. Small-moderate D2 with 80% ostial stenosis. Large D1 with long 80% proximal stenosis. Patent LIMA-LAD. Patent SVG-large D1.   Left circumflex (LCx): 80% proximal stenosis proximal to the take-off of a large PLOM. There is a long up to 99% stenosed area in the proximal PLOM. Total occlusion of SVG-PLOM.   Right coronary artery (RCA): Total occlusion of the proximal RCA. There are left to right collaterals that appear to primarily come from the LAD system. Total occlusion of SVG-RCA system.   Left ventriculography: Not done, elevated LVEDP and recent echo.   Final Conclusions: 2/4 patent grafts with SVG-PLOM and SVG-RCA system totally occluded. There are collaterals from the LAD system to the RCA and the RCA is totally occluded proximally. There are no collaterals to the LCx territory.  Given significant fall in LV systolic function, will plan PCI to LCx and PLOM. Dr Irish Lack to perform.   Recommendations: After PCI, will need repeat echo in 3 months to assess for ICD.   With elevated LV EDP, will restart IV Lasix.   Loralie Champagne 07/21/2014,  PROCEDURE: PCI Left circumflex  INDICATIONS: Acute systolic heart failure, elevated troponin, bypass graft closure, NSTEMI  The risks, benefits, and details of the procedure were explained to the patient. The patient verbalized understanding and wanted to proceed. Informed written consent was obtained.  PROCEDURE TECHNIQUE: The diagnostic catheterization was performed by Dr. [name]. This revealed severe disease in the native circumflex, up to 99% in the mid circumflex. There was a significant lesion more proximal as well. The segment with the 99% stenosis was a long segment of diffuse disease. Angiomax was used for anticoagulation. ACT was used to check that the Angiomax was therapeutic. A fielder XT wire was placed across the area of severe disease in the mid circumflex. A 2.0 x 20 balloon was used to predilate. A 2.25 x 38 Synergy stent was then deployed across the more distal area disease. A 2.5 x 16 Synergy stent was deployed in overlapping fashion across the more proximal area disease. The distal stent was postdilated with a 2.5 x 12 noncompliant balloon. The more proximal area was postdilated with a 3.0 x 12 noncompliant balloon to high pressure. Several doses of nitroglycerin were  administered intracoronary. The patient tolerated the procedure well. There was an excellent angiographic appearance. A vascade closure device was deployed for hemostasis.   CONTRAST: Total of 95 cc.  COMPLICATIONS: None.    IMPRESSIONS:  1. Successful PCI of the mid left circumflex artery with overlapping Synergy drug-eluting stents as noted above.   RECOMMENDATION: Continue dual antiplatelet therapy for at least a year. He will  be watched overnight. We'll continue Angiomax since he only got the Plavix while he was on the cath table.  Treatments: See above  Discharge Exam: Blood pressure 122/73, pulse 85, temperature 97.8 F (36.6 C), temperature source Oral, resp. rate 18, height _0  (1.778 m), weight 218 lb 4.1 oz (99 kg), SpO2 95 %.   Disposition: 01-Home or Self Care      Discharge Instructions    Diet - low sodium heart healthy    Complete by:  As directed      Discharge instructions    Complete by:  As directed   Monitor your weight every morning.  If you gain 3 pounds in 24 hours, or 5 pounds in a week, call the office for instructions.     Increase activity slowly    Complete by:  As directed             Medication List    TAKE these medications        aspirin EC 81 MG tablet  Take 1 tablet (81 mg total) by mouth daily.     atorvastatin 20 MG tablet  Commonly known as:  LIPITOR  Take 1 tablet (20 mg total) by mouth daily at 6 PM. Keep upcoming appointment.     budesonide-formoterol 160-4.5 MCG/ACT inhaler  Commonly known as:  SYMBICORT  Inhale 2 puffs into the lungs 2 (two) times daily.     carvedilol 6.25 MG tablet  Commonly known as:  COREG  Take 1 tablet (6.25 mg total) by mouth 2 (two) times daily with a meal.     clopidogrel 75 MG tablet  Commonly known as:  PLAVIX  Take 1 tablet (75 mg total) by mouth daily with breakfast.     furosemide 40 MG tablet  Commonly known as:  LASIX  Take 0.5-1 tablets (20-40 mg total) by mouth daily.     Guaifenesin 1200 MG Tb12  Commonly known as:  MUCINEX MAXIMUM STRENGTH  Take 1 tablet (1,200 mg total) by mouth every 12 (twelve) hours as needed.     JANUMET XR [phone] MG Tb24  Generic drug:  SitaGLIPtin-MetFORMIN HCl  TAKE ONE TABLET BY MOUTH AT BEDTIME     losartan 25 MG tablet  Commonly known as:  COZAAR  Take 1 tablet (25 mg total) by mouth daily.     spironolactone 25 MG tablet  Commonly known as:  ALDACTONE  Take 1 tablet  (25 mg total) by mouth daily.       Follow-up Information    Follow up with Loralie Champagne, MD On 07/29/2014.   Specialty:  Cardiology   Why:  11:20 AM   Contact information:   [address] [place] [address] [phone]       Follow up with Labs.   Why:  Have blood drawn before appt.    Contact information:   [place] [address] [phone]     Greater than 30 minutes was spent completing the patient's discharge.    SignedTarri Fuller, [place] 07/22/2014,  9:26 AM

## 2014-07-22 NOTE — Progress Notes (Signed)
CARDIAC REHAB PHASE I   PRE:  Rate/Rhythm: 88 SR    BP: sitting 135/84    SaO2: 98 RA  MODE:  Ambulation: 700 ft   POST:  Rate/Rhythm: 98 SR    BP: sitting 138/77     SaO2: 92 RA  Tolerated well. Walked x1 lap and room SaO2 registered 85 RA then quickly up to 98 RA. Therefore walked another lap and checked saO2 entire walk, which was 92 RA entire time. No SOB, pt feels well, anxious to d/c. Long discussion of HF, CAD, risk factors, daily wts, diet esp low sodium, ex, NTG, Plavix and CRPII. Pt not interested in CRPII at this time as he did it before. Pt plans to weigh daily and watch sodium. He will have someone cooking for him x3 week. Pt able to show good teach back ability.  [id number]   Josephina Shih [place] CES, ACSM 07/22/2014 11:50 AM

## 2014-07-22 NOTE — Progress Notes (Signed)
Went over discharge instructions with patient along with follow up appointments and new medications. Pt expressed understanding of discharge instructions. IV d/c'd, patient taken off cardiac monitor. Pt discharged home.   Roxan Hockey, RN

## 2014-07-22 NOTE — Progress Notes (Signed)
Patient ID: Vincent Chandler, male   DOB: May 17, 1961, 54 y.o.   MRN: [id number]   SUBJECTIVE: No dyspnea or chest pain.  Had PCI to native LCx yesterday.  Echo this admission showed fall in EF from 40% in 9/14 to 20-25%.    Scheduled Meds: . antiseptic oral rinse  7 mL Mouth Rinse BID  . aspirin  81 mg Oral Daily  . atorvastatin  20 mg Oral q1800  . budesonide-formoterol  2 puff Inhalation BID  . carvedilol  6.25 mg Oral BID WC  . clopidogrel  75 mg Oral Q breakfast  . enoxaparin (LOVENOX) injection  40 mg Subcutaneous Q24H  . fluticasone  2 spray Each Nare Daily  . furosemide  40 mg Intravenous BID  . insulin aspart  0-20 Units Subcutaneous TID WC  . insulin aspart  0-5 Units Subcutaneous QHS  . linagliptin  5 mg Oral Daily  . losartan  25 mg Oral Daily  . sodium chloride  3 mL Intravenous Q12H  . spironolactone  25 mg Oral Daily   Continuous Infusions: . sodium chloride 10 mL/hr at 07/21/14 1545  . bivalirudin (ANGIOMAX) infusion 5 mg/mL (Cath Lab,ACS,PCI indication) 1.75 mg/kg/hr (07/21/14 1400)   PRN Meds:.sodium chloride, acetaminophen **OR** acetaminophen, acetaminophen, alum & mag hydroxide-simeth, guaiFENesin, HYDROmorphone (DILAUDID) injection, ondansetron **OR** ondansetron (ZOFRAN) IV, oxyCODONE, sodium chloride    Filed Vitals:   07/21/14 2348 07/22/14 0400 07/22/14 0410 07/22/14 0822  BP: 118/78 122/68 122/68 122/73  Pulse: 84 81 85   Temp: 98.7 F (37.1 C)  97 F (36.1 C) 97.8 F (36.6 C)  TempSrc: Axillary  Oral Oral  Resp: _0 Height:      Weight:   218 lb 4.1 oz (99 kg)   SpO2: 94% 92% 95% 95%    Intake/Output Summary (Last 24 hours) at 07/22/14 0836 Last data filed at 07/22/14 0200  Gross per 24 hour  Intake  357.5 ml  Output   2625 ml  Net -2267.5 ml    LABS: Basic Metabolic Panel:  Recent Labs  07/21/14 0210 07/22/14 0315  NA 139 140  K 4.0 4.1  CL 104 102  CO2 26 31  GLUCOSE 170* 164*  BUN 22 22  CREATININE 1.11 1.33  CALCIUM  9.3 9.7   Liver Function Tests: No results for input(s): AST, ALT, ALKPHOS, BILITOT, PROT, ALBUMIN in the last 72 hours. No results for input(s): LIPASE, AMYLASE in the last 72 hours. CBC:  Recent Labs  07/21/14 0210 07/22/14 0315  WBC 7.2 7.2  HGB 13.9 14.7  HCT 41.8 44.2  MCV 105.8* 107.3*  PLT 224 228   Cardiac Enzymes: No results for input(s): CKTOTAL, CKMB, CKMBINDEX, TROPONINI in the last 72 hours. BNP: Invalid input(s): POCBNP D-Dimer: No results for input(s): DDIMER in the last 72 hours. Hemoglobin A1C: No results for input(s): HGBA1C in the last 72 hours. Fasting Lipid Panel:  Recent Labs  07/21/14 0210  CHOL 145  HDL 35*  LDLCALC 75  TRIG 173*  CHOLHDL 4.1   Thyroid Function Tests: No results for input(s): TSH, T4TOTAL, T3FREE, THYROIDAB in the last 72 hours.  Invalid input(s): FREET3 Anemia Panel: No results for input(s): VITAMINB12, FOLATE, FERRITIN, TIBC, IRON, RETICCTPCT in the last 72 hours.  RADIOLOGY: Dg Chest 2 View  07/17/2014   CLINICAL DATA:  Acute onset of cough, wheezing and hypoxemia. Current history of asthma. Prior CABG. Obstructive sleep apnea requiring CPAP.  EXAM: CHEST  2 VIEW  COMPARISON:  06/13/2013 dating back to 02/16/2012.  FINDINGS: Sternotomy for CABG. Cardiac silhouette mildly enlarged. Thoracic aorta mildly atherosclerotic, unchanged. Hilar and mediastinal contours otherwise unremarkable. Pulmonary venous hypertension with minimal interstitial pulmonary edema as evidenced by scattered Kerley B-lines. Small bilateral pleural effusions suspected. No confluent airspace consolidation. No pneumothorax. Visualized bony thorax intact.  IMPRESSION: Minimal CHF, with stable mild cardiomegaly and minimal interstitial pulmonary edema. Small bilateral pleural effusions.   Electronically Signed   By: Evangeline Dakin M.D.   On: 07/17/2014 16:08   Ct Angio Chest Pe W/cm &/or Wo Cm  07/18/2014   CLINICAL DATA:  Acute onset of shortness of breath  and generalized chest pain. Initial encounter.  EXAM: CT ANGIOGRAPHY CHEST WITH CONTRAST  TECHNIQUE: Multidetector CT imaging of the chest was performed using the standard protocol during bolus administration of intravenous contrast. Multiplanar CT image reconstructions and MIPs were obtained to evaluate the vascular anatomy.  CONTRAST:  75 mL of Omnipaque 350 IV contrast  COMPARISON:  Chest radiograph performed 07/17/2014  FINDINGS: There is no evidence of significant pulmonary embolus. Evaluation for pulmonary embolus is mildly suboptimal due to motion artifact.  Trace bilateral pleural effusions are noted, with hazy bilateral airspace opacities, compatible with pulmonary edema. Underlying interstitial prominence is seen. There is no evidence of pneumothorax. No masses are identified; no abnormal focal contrast enhancement is seen.  The heart is mildly enlarged. The patient is status post median sternotomy. Scattered coronary artery calcifications are seen. Visualized mediastinal nodes remain normal in size. No pericardial effusion is identified. Incidental note is made of a retroesophageal right subclavian artery. The great vessels are otherwise grossly unremarkable. No axillary lymphadenopathy is seen. The visualized portions of the thyroid gland are unremarkable in appearance.  The visualized portions of the liver and spleen are unremarkable. The visualized portions of the pancreas, gallbladder, stomach and adrenal glands are within normal limits. Minimal nonspecific perinephric stranding is noted bilaterally.  No acute osseous abnormalities are seen.  Review of the MIP images confirms the above findings.  IMPRESSION: 1. No evidence of significant pulmonary embolus. 2. Trace bilateral pleural effusions, with hazy bilateral airspace opacities, compatible with pulmonary edema. Underlying interstitial prominence seen. 3. Mild cardiomegaly. 4. Scattered coronary artery calcifications noted.   Electronically Signed    By: Garald Balding M.D.   On: 07/18/2014 03:07    PHYSICAL EXAM General: NAD Neck: Thick, no JVD, no thyromegaly or thyroid nodule.  Lungs: Clear to auscultation bilaterally with normal respiratory effort. CV: Nondisplaced PMI.  Heart regular S1/S2, no S3/S4, no murmur.  No peripheral edema.  No carotid bruit.  Normal pedal pulses.  Abdomen: Soft, nontender, no hepatosplenomegaly, no distention.  Neurologic: Alert and oriented x 3.  Psych: Normal affect. Extremities: No clubbing or cyanosis.   TELEMETRY: Reviewed telemetry pt in NSR  ASSESSMENT AND PLAN: 54 yo with history of CAD s/p CABG and ischemic CMP was admitted with acute on chronic systolic CHF.   1. Acute on chronic systolic CHF: Ischemic cardiomyopathy.  EF 20-25% by echo, this is lower than in the past (40% in 9/14).  He was not marked symptomatic but had low oxygen saturation when evaluated by PCP. Some dyspnea, some atypical chest pain.  He has been diuresed with IV Lasix.  LVEDP was still 33 mmHg yesterday on left heart cath.  He diuresed well overnight.  - I recommended one more day of IV Lasix, but he is very anxious to get home.  I will give an IV Lasix dose  this morning and he will go home on Lasix 40 qam, 20 qpm.  - Continue Coreg  - Increase spironolactone to 25 mg daily - Restart losartan 25 mg daily.   - Echo in 3 months to reassess EF.  If remains low, will need ICD.  2. CAD: s/p CABG.  Atypical chest pain prior to admission.  Mild increase in troponin to peak 0.07, possibly this was due to demand ischemia with volume overload.  However, EF has fallen from 40% to 20-25%.  Concern for progression of CAD.  LHC yesterday showed total occlusion of SVG-RCA and SVG-PLOM.  The RCA was collateralized from the LAD system. There was severe stenosis of the native LCx up to 99% with loss of the vein graft.  This was treated with overlapping DES to native LCx/PLOM.   - P2Y12 test on Plavix adequate today, continue ASA and Plavix.    - Continue statin. LDL ok.  3. AKI: With diuresis.  Creatinine now back down to baseline.  4. OSA: Needs to resume CPAP use.  Will need followup with pulmonary.  5. Disposition: Home today.  Will need followup in CHF clinic with me next week.  Needs to be set up for pulmonary sleep medicine followup.  Meds: Lasix 40 qam/20 qpm, ASA 81, Plavix 75, atorvastatin 20, Coreg 6.25 mg bid, losartan 25 daily, spironolactone 25 daily.  Needs BMET next week as well.   Loralie Champagne 07/22/2014 8:36 AM

## 2014-07-29 ENCOUNTER — Encounter (HOSPITAL_COMMUNITY): Payer: BLUE CROSS/BLUE SHIELD

## 2014-08-04 ENCOUNTER — Telehealth: Payer: Self-pay | Admitting: Nurse Practitioner

## 2014-08-04 ENCOUNTER — Telehealth: Payer: Self-pay

## 2014-08-04 NOTE — Telephone Encounter (Signed)
Admit date: 07/17/2014 Discharge date: 07/22/2014  Reason for admission:  Acute combined systolic and diastolic congestive heart failure  Pt states he has been doing well since discharge.  No complaints of chest pain, shortness of breath, edema, etc.  No significant weight gain noted since discharge.  Pt was instructed to continue to weigh himself daily and to notify office if he gains more than 3 lbs in 24 hours or 5 lbs in a week.  He stated understanding and agreed.  States he's taking all medications as prescribed.  He plans to bring list in with him during next office visit.  No appointment yet with cardiology.  He has tried calling them to schedule an appointment.  They are going to call him back with an appointment.     Patient concern:  During hospital stay, he was asked to resume wearing his CPAP.  Pt has one at home, but states he needs a new machine.  According to patient, his last sleep study was approximately 6 years ago.  He is also uncertain of his settings.

## 2014-08-04 NOTE — Telephone Encounter (Signed)
Patient requesting OV with Truitt Merle, NP  for follow up after hospitalization. Arranged OV next Wednesday, per Cecille Rubin. Patient is agreeable to appt, and agreed to make appt (not no show)

## 2014-08-04 NOTE — Telephone Encounter (Signed)
New Message  Pt called to make f/u. Offer 2/23, but pt wanted to speak w/ Rn. Please call back and discuss.

## 2014-08-06 ENCOUNTER — Ambulatory Visit (INDEPENDENT_AMBULATORY_CARE_PROVIDER_SITE_OTHER): Payer: BLUE CROSS/BLUE SHIELD | Admitting: Family Medicine

## 2014-08-06 ENCOUNTER — Encounter: Payer: Self-pay | Admitting: Family Medicine

## 2014-08-06 VITALS — BP 124/84 | HR 88 | Temp 98.4°F | Wt 221.0 lb

## 2014-08-06 DIAGNOSIS — I214 Non-ST elevation (NSTEMI) myocardial infarction: Secondary | ICD-10-CM

## 2014-08-06 DIAGNOSIS — E785 Hyperlipidemia, unspecified: Secondary | ICD-10-CM

## 2014-08-06 DIAGNOSIS — I5041 Acute combined systolic (congestive) and diastolic (congestive) heart failure: Secondary | ICD-10-CM

## 2014-08-06 DIAGNOSIS — IMO0002 Reserved for concepts with insufficient information to code with codable children: Secondary | ICD-10-CM

## 2014-08-06 DIAGNOSIS — E1165 Type 2 diabetes mellitus with hyperglycemia: Secondary | ICD-10-CM

## 2014-08-06 DIAGNOSIS — G4733 Obstructive sleep apnea (adult) (pediatric): Secondary | ICD-10-CM

## 2014-08-06 MED ORDER — LOSARTAN POTASSIUM 25 MG PO TABS: 25.0000 mg | ORAL_TABLET | Freq: Every day | ORAL | Status: DC

## 2014-08-06 MED ORDER — GLIMEPIRIDE 2 MG PO TABS
2.0000 mg | ORAL_TABLET | Freq: Every day | ORAL | Status: DC
Start: 2014-08-06 — End: 2014-12-09

## 2014-08-06 MED ORDER — SPIRONOLACTONE 25 MG PO TABS: 25.0000 mg | ORAL_TABLET | Freq: Every day | ORAL | Status: DC

## 2014-08-06 NOTE — Progress Notes (Signed)
Pre visit review using our clinic review tool, if applicable. No additional management support is needed unless otherwise documented below in the visit note.

## 2014-08-06 NOTE — Assessment & Plan Note (Signed)
Per cardio

## 2014-08-06 NOTE — Assessment & Plan Note (Signed)
Recheck labs

## 2014-08-06 NOTE — Assessment & Plan Note (Signed)
con't janumet Add amaryl

## 2014-08-06 NOTE — Patient Instructions (Signed)
Diabetes and Standards of Medical Care Diabetes is complicated. You may find that your diabetes team includes a dietitian, nurse, diabetes educator, eye doctor, and more. To help everyone know what is going on and to help you get the care you deserve, the following schedule of care was developed to help keep you on track. Below are the tests, exams, vaccines, medicines, education, and plans you will need. HbA1c test This test shows how well you have controlled your glucose over the past 2-3 months. It is used to see if your diabetes management plan needs to be adjusted.   It is performed at least 2 times a year if you are meeting treatment goals.  It is performed 4 times a year if therapy has changed or if you are not meeting treatment goals. Blood pressure test  This test is performed at every routine medical visit. The goal is less than 140/90 mm Hg for most people, but 130/80 mm Hg in some cases. Ask your health care provider about your goal. Dental exam  Follow up with the dentist regularly. Eye exam  If you are diagnosed with type 1 diabetes as a child, get an exam upon reaching the age of 37 years or older and have had diabetes for 3-5 years. Yearly eye exams are recommended after that initial eye exam.  If you are diagnosed with type 1 diabetes as an adult, get an exam within 5 years of diagnosis and then yearly.  If you are diagnosed with type 2 diabetes, get an exam as soon as possible after the diagnosis and then yearly. Foot care exam  Visual foot exams are performed at every routine medical visit. The exams check for cuts, injuries, or other problems with the feet.  A comprehensive foot exam should be done yearly. This includes visual inspection as well as assessing foot pulses and testing for loss of sensation.  Check your feet nightly for cuts, injuries, or other problems with your feet. Tell your health care provider if anything is not healing. Kidney function test (urine  microalbumin)  This test is performed once a year.  Type 1 diabetes: The first test is performed 5 years after diagnosis.  Type 2 diabetes: The first test is performed at the time of diagnosis.  A serum creatinine and estimated glomerular filtration rate (eGFR) test is done once a year to assess the level of chronic kidney disease (CKD), if present. Lipid profile (cholesterol, HDL, LDL, triglycerides)  Performed every 5 years for most people.  The goal for LDL is less than 100 mg/dL. If you are at high risk, the goal is less than 70 mg/dL.  The goal for HDL is 40 mg/dL-50 mg/dL for men and 50 mg/dL-60 mg/dL for women. An HDL cholesterol of 60 mg/dL or higher gives some protection against heart disease.  The goal for triglycerides is less than 150 mg/dL. Influenza vaccine, pneumococcal vaccine, and hepatitis B vaccine  The influenza vaccine is recommended yearly.  It is recommended that people with diabetes who are over 24 years old get the pneumonia vaccine. In some cases, two separate shots may be given. Ask your health care provider if your pneumonia vaccination is up to date.  The hepatitis B vaccine is also recommended for adults with diabetes. Diabetes self-management education  Education is recommended at diagnosis and ongoing as needed. Treatment plan  Your treatment plan is reviewed at every medical visit. Document Released: 04/16/2009 Document Revised: 11/03/2013 Document Reviewed: 11/19/2012 [name] Patient Information 2015 [place],  [employer]. This information is not intended to replace advice given to you by your health care provider. Make sure you discuss any questions you have with your health care provider.  

## 2014-08-06 NOTE — Progress Notes (Signed)
Subjective:    Patient ID: Vincent Chandler, male    DOB: Nov 19, 1960, 54 y.o.   MRN: [id number]  HPI  Patient here for hosp f/u -- CHf, MI, dm, htn, hyperlipidemia  Past Medical History  Diagnosis Date  . Coronary artery disease     drug eluting stent RCA 2005-EF 30% at that time - s/p CABG x 4  . Cardiomyopathy     alcohol use related  . Asthma   . Hypertension   . Urinary incontinence   . Hyperlipidemia   . History of hypogonadism   . Pneumonia 02/16/2012    "today makes third time in my life"  . OSA on CPAP 02/16/2012    "wear mask sometimes"  . H/O hiatal hernia   . GERD (gastroesophageal reflux disease)   . Migraines 02/16/2012    "used to"  . Anxiety   . Diabetes mellitus type II     Type 2 NIDDM x 7 years  . History of kidney stones 06/2012  . Atrial fibrillation-postoperative 11/28/2012    Review of Systems  Constitutional: Negative for fatigue and unexpected weight change.  Respiratory: Negative for cough, shortness of breath and wheezing.   Cardiovascular: Negative for chest pain, palpitations and leg swelling.  Psychiatric/Behavioral: Negative for behavioral problems, confusion, dysphoric mood, decreased concentration and agitation. The patient is not nervous/anxious.        Objective:    Physical Exam  Constitutional: He is oriented to person, place, and time. Vital signs are normal. He appears well-developed and well-nourished. He is sleeping.  HENT:  Head: Normocephalic and atraumatic.  Mouth/Throat: Oropharynx is clear and moist.  Eyes: EOM are normal. Pupils are equal, round, and reactive to light.  Neck: Normal range of motion. Neck supple. No thyromegaly present.  Cardiovascular: Normal rate and regular rhythm.   No murmur heard. Pulmonary/Chest: Effort normal and breath sounds normal. No respiratory distress. He has no wheezes. He has no rales. He exhibits no tenderness.  Musculoskeletal: He exhibits no edema or tenderness.  Neurological: He is  alert and oriented to person, place, and time.  Skin: Skin is warm and dry.  Psychiatric: He has a normal mood and affect. His behavior is normal. Judgment and thought content normal.    BP 124/84 mmHg  Pulse 88  Temp(Src) 98.4 F (36.9 C) (Oral)  Wt 221 lb (100.245 kg)  SpO2 96% Wt Readings from Last 3 Encounters:  08/06/14 221 lb (100.245 kg)  07/22/14 218 lb 4.1 oz (99 kg)  07/17/14 228 lb 12.8 oz (103.783 kg)     Lab Results  Component Value Date   WBC 7.2 07/22/2014   HGB 14.7 07/22/2014   HCT 44.2 07/22/2014   PLT 228 07/22/2014   GLUCOSE 164* 07/22/2014   CHOL 145 07/21/2014   TRIG 173* 07/21/2014   HDL 35* 07/21/2014   LDLDIRECT 109.0 04/09/2013   LDLCALC 75 07/21/2014   ALT 22 07/17/2014   AST 24 07/17/2014   NA 140 07/22/2014   K 4.1 07/22/2014   CL 102 07/22/2014   CREATININE 1.33 07/22/2014   BUN 22 07/22/2014   CO2 31 07/22/2014   TSH 2.025 08/17/2012   PSA 0.24 03/10/2010   INR 1.02 07/21/2014   HGBA1C 9.9* 07/18/2014   MICROALBUR 3.4* 04/09/2013    Dg Chest 2 View  07/17/2014   CLINICAL DATA:  Acute onset of cough, wheezing and hypoxemia. Current history of asthma. Prior CABG. Obstructive sleep apnea requiring CPAP.  EXAM: CHEST  2  VIEW  COMPARISON:  06/13/2013 dating back to 02/16/2012.  FINDINGS: Sternotomy for CABG. Cardiac silhouette mildly enlarged. Thoracic aorta mildly atherosclerotic, unchanged. Hilar and mediastinal contours otherwise unremarkable. Pulmonary venous hypertension with minimal interstitial pulmonary edema as evidenced by scattered Kerley B-lines. Small bilateral pleural effusions suspected. No confluent airspace consolidation. No pneumothorax. Visualized bony thorax intact.  IMPRESSION: Minimal CHF, with stable mild cardiomegaly and minimal interstitial pulmonary edema. Small bilateral pleural effusions.   Electronically Signed   By: Evangeline Dakin M.D.   On: 07/17/2014 16:08   Ct Angio Chest Pe W/cm &/or Wo Cm  07/18/2014    CLINICAL DATA:  Acute onset of shortness of breath and generalized chest pain. Initial encounter.  EXAM: CT ANGIOGRAPHY CHEST WITH CONTRAST  TECHNIQUE: Multidetector CT imaging of the chest was performed using the standard protocol during bolus administration of intravenous contrast. Multiplanar CT image reconstructions and MIPs were obtained to evaluate the vascular anatomy.  CONTRAST:  75 mL of Omnipaque 350 IV contrast  COMPARISON:  Chest radiograph performed 07/17/2014  FINDINGS: There is no evidence of significant pulmonary embolus. Evaluation for pulmonary embolus is mildly suboptimal due to motion artifact.  Trace bilateral pleural effusions are noted, with hazy bilateral airspace opacities, compatible with pulmonary edema. Underlying interstitial prominence is seen. There is no evidence of pneumothorax. No masses are identified; no abnormal focal contrast enhancement is seen.  The heart is mildly enlarged. The patient is status post median sternotomy. Scattered coronary artery calcifications are seen. Visualized mediastinal nodes remain normal in size. No pericardial effusion is identified. Incidental note is made of a retroesophageal right subclavian artery. The great vessels are otherwise grossly unremarkable. No axillary lymphadenopathy is seen. The visualized portions of the thyroid gland are unremarkable in appearance.  The visualized portions of the liver and spleen are unremarkable. The visualized portions of the pancreas, gallbladder, stomach and adrenal glands are within normal limits. Minimal nonspecific perinephric stranding is noted bilaterally.  No acute osseous abnormalities are seen.  Review of the MIP images confirms the above findings.  IMPRESSION: 1. No evidence of significant pulmonary embolus. 2. Trace bilateral pleural effusions, with hazy bilateral airspace opacities, compatible with pulmonary edema. Underlying interstitial prominence seen. 3. Mild cardiomegaly. 4. Scattered coronary  artery calcifications noted.   Electronically Signed   By: Garald Balding M.D.   On: 07/18/2014 03:07       Assessment & Plan:   Problem List Items Addressed This Visit    OSA (obstructive sleep apnea)   Relevant Orders   Ambulatory referral to Pulmonology   NSTEMI (non-ST elevated myocardial infarction)    Per cardio      Relevant Medications   spironolactone (ALDACTONE) tablet   losartan (COZAAR) tablet   Hyperlipidemia    Recheck labs      Relevant Medications   spironolactone (ALDACTONE) tablet   losartan (COZAAR) tablet   Diabetes mellitus type II, uncontrolled    con't janumet Add amaryl      Relevant Medications   losartan (COZAAR) tablet   glimepiride (AMARYL) tablet   Acute combined systolic and diastolic congestive heart failure - Primary    con't  Lasix F/u cardiology      Relevant Medications   spironolactone (ALDACTONE) tablet   losartan (COZAAR) tablet   Other Relevant Orders   Basic metabolic panel       Garnet Koyanagi, DO

## 2014-08-06 NOTE — Assessment & Plan Note (Signed)
con't  Lasix F/u cardiology

## 2014-08-07 ENCOUNTER — Telehealth: Payer: Self-pay | Admitting: Family Medicine

## 2014-08-07 LAB — BASIC METABOLIC PANEL
BUN: 28 mg/dL — ABNORMAL HIGH (ref 6–23)
CALCIUM: 9.7 mg/dL (ref 8.4–10.5)
CO2: 30 mEq/L (ref 19–32)
CREATININE: 1.38 mg/dL (ref 0.40–1.50)
Chloride: 100 mEq/L (ref 96–112)
GFR: 57.28 mL/min — ABNORMAL LOW (ref >60.00)
Glucose, Bld: 118 mg/dL — ABNORMAL HIGH (ref 70–99)
Potassium: 4 mEq/L (ref 3.5–5.1)
SODIUM: 139 meq/L (ref 135–145)

## 2014-08-07 NOTE — Telephone Encounter (Signed)
emmi emailed °

## 2014-08-12 ENCOUNTER — Ambulatory Visit (INDEPENDENT_AMBULATORY_CARE_PROVIDER_SITE_OTHER): Payer: BLUE CROSS/BLUE SHIELD | Admitting: Nurse Practitioner

## 2014-08-12 ENCOUNTER — Encounter: Payer: Self-pay | Admitting: Nurse Practitioner

## 2014-08-12 VITALS — BP 110/80 | HR 99 | Ht 70.0 in | Wt 225.0 lb

## 2014-08-12 DIAGNOSIS — I5022 Chronic systolic (congestive) heart failure: Secondary | ICD-10-CM

## 2014-08-12 DIAGNOSIS — I255 Ischemic cardiomyopathy: Secondary | ICD-10-CM

## 2014-08-12 DIAGNOSIS — I4891 Unspecified atrial fibrillation: Secondary | ICD-10-CM

## 2014-08-12 DIAGNOSIS — Z91199 Patient's noncompliance with other medical treatment and regimen due to unspecified reason: Secondary | ICD-10-CM

## 2014-08-12 DIAGNOSIS — I472 Ventricular tachycardia, unspecified: Secondary | ICD-10-CM

## 2014-08-12 DIAGNOSIS — Z9119 Patient's noncompliance with other medical treatment and regimen: Secondary | ICD-10-CM

## 2014-08-12 LAB — BASIC METABOLIC PANEL
BUN: 30 mg/dL — ABNORMAL HIGH (ref 6–23)
CO2: 26 mEq/L (ref 19–32)
Calcium: 9.5 mg/dL (ref 8.4–10.5)
Chloride: 99 mEq/L (ref 96–112)
Creatinine, Ser: 1.32 mg/dL (ref 0.40–1.50)
GFR: 60.29 mL/min (ref >60.00)
Glucose, Bld: 259 mg/dL — ABNORMAL HIGH (ref 70–99)
Potassium: 3.7 mEq/L (ref 3.5–5.1)
Sodium: 134 mEq/L — ABNORMAL LOW (ref 135–145)

## 2014-08-12 LAB — CBC
HCT: 44.7 % (ref 39.0–52.0)
Hemoglobin: 15 g/dL (ref 13.0–17.0)
MCHC: 33.6 g/dL (ref 30.0–36.0)
MCV: 102.6 fl — ABNORMAL HIGH (ref 78.0–100.0)
Platelets: 222 10*3/uL (ref 150.0–400.0)
RBC: 4.36 Mil/uL (ref 4.22–5.81)
RDW: 15 % (ref 11.5–15.5)
WBC: 6.9 10*3/uL (ref 4.0–10.5)

## 2014-08-12 MED ORDER — CARVEDILOL 25 MG PO TABS: 12.5000 mg | ORAL_TABLET | Freq: Two times a day (BID) | ORAL | Status: DC

## 2014-08-12 MED ORDER — NITROGLYCERIN 0.4 MG SL SUBL: 0.4000 mg | SUBLINGUAL_TABLET | SUBLINGUAL | Status: DC | PRN

## 2014-08-12 NOTE — Addendum Note (Signed)
Addended by: Burtis Junes on: 08/12/2014 10:47 AM   Modules accepted: Orders

## 2014-08-12 NOTE — Progress Notes (Signed)
CARDIOLOGY OFFICE NOTE  Date:  08/12/2014    Vincent Chandler Date of Birth: Nov 22, 1960 Medical Record #[id number]  PCP:  Garnet Koyanagi, DO  Cardiologist:  [place]    Chief Complaint  Patient presents with  . Congestive Heart Failure    Post hospital visit - seen for Dr. Caryl Comes and Dr. [name]     History of Present Illness: Vincent Chandler is a 54 y.o. male who presents today for a post hospital visit. Seen for Dr. Caryl Comes and Dr. [name]. He has a known history of CAD s/p 4 vessel CABG back in 2014, hypertension, diabetes mellitus, obesity, OSA, hyperlipidemia, post cabg afib, anxiety, kidney stones, on CPAP, asthma, pneumonia x 3 and a history of noncompliance.   I have not seen him in over a year.   He most recently presented with acute respiratory distress and VT. He apparently had run out of several of his medications and was not restricting his salt intake. He developed respiratory distress at home and went to his PCP where due to his hypoxia on the pulse ox in 80s he was sent to [facility] ED for evaluation. There he was found to have VT with 45 beats which he was not aware of and thus was transferred to [facility] for further care. He was diuresed. Troponin was elevated at 0.07. Echo revealed that his EF had dropped further to 20-25%. G3DD. Severe diffuse hypokinesis. He underwent left heart cath revealing 2/4 patent grafts with SVG-PLOM and SVG-RCA system totally occluded. There are collaterals from the LAD system to the RCA and the RCA is totally occluded proximally. There are no collaterals to the LCx territory. He then underwent successful PCI of the mid left circumflex artery with overlapping Synergy drug-eluting stents (See cath reports below). He was placed on plavix. P2Y12 showed the plavix was working appropriately. Repeat echo planned for three months. If no improvement in LVEF, he will need consideration for ICD once again.   He was to have already seen Dr. [name] back - he  cancelled that visit - he has wished to come back and see me.   Comes back today. Here alone. He notes that he is doing ok. He is not short of breath. No chest pain. Weight is stable at home. He says he has hired someone to cook his meals that are salt and sugar free. He feels this will help him stay compliant with his regimen. He tells me that he was only out of his medicines for 3 days when this last event occurred. He was not able to get his medicines refilled. He had gotten way off track with taking care of his diabetes and restricting his salt due to extensive traveling with his job.  He is not dizzy. Not lightheaded. No syncope. Has no palpitations. Admits he is very confused about what happened to him and needs clarification.   Past Medical History  Diagnosis Date  . Coronary artery disease     drug eluting stent RCA 2005-EF 30% at that time - s/p CABG x 4  . Cardiomyopathy     alcohol use related  . Asthma   . Hypertension   . Urinary incontinence   . Hyperlipidemia   . History of hypogonadism   . Pneumonia 02/16/2012    "today makes third time in my life"  . OSA on CPAP 02/16/2012    "wear mask sometimes"  . H/O hiatal hernia   . GERD (gastroesophageal reflux disease)   . Migraines  02/16/2012    "used to"  . Anxiety   . Diabetes mellitus type II     Type 2 NIDDM x 7 years  . History of kidney stones 06/2012  . Atrial fibrillation-postoperative 11/28/2012    Past Surgical History  Procedure Laterality Date  . Tonsillectomy  ~ 1970  . Coronary angioplasty with stent placement  ~ 2003  . Refractive surgery  1990's    bilaterally  . Coronary artery bypass graft N/A 08/15/2012    Procedure: CORONARY ARTERY BYPASS GRAFTING (CABG);  Surgeon: Ivin Poot, MD;  Location: [facility];  Service: Open Heart Surgery;  Laterality: N/A;  Coronary Artery Bypass Grafting Times Four Using Left Internal Mammary Artery and Right Saphenous Leg Vein Harvested Endoscopically  . Left heart  catheterization with coronary angiogram N/A 08/08/2012    Procedure: LEFT HEART CATHETERIZATION WITH CORONARY ANGIOGRAM;  Surgeon: Peter M Martinique, MD;  Location: [facility] CATH LAB;  Service: Cardiovascular;  Laterality: N/A;  . Left heart catheterization with coronary/graft angiogram N/A 07/21/2014    Procedure: LEFT HEART CATHETERIZATION WITH Beatrix Fetters;  Surgeon: Larey Dresser, MD;  Location: [facility] CATH LAB;  Service: Cardiovascular;  Laterality: N/A;  . Percutaneous coronary stent intervention (pci-s)  07/21/2014    Procedure: PERCUTANEOUS CORONARY STENT INTERVENTION (PCI-S);  Surgeon: Larey Dresser, MD;  Location: [facility] CATH LAB;  Service: Cardiovascular;;     Medications: Current Outpatient Prescriptions  Medication Sig Dispense Refill  . aspirin EC 81 MG tablet Take 1 tablet (81 mg total) by mouth daily.    Marland Kitchen atorvastatin (LIPITOR) 20 MG tablet Take 1 tablet (20 mg total) by mouth daily at 6 PM. Keep upcoming appointment. 30 tablet 0  . budesonide-formoterol (SYMBICORT) 160-4.5 MCG/ACT inhaler Inhale 2 puffs into the lungs 2 (two) times daily. 1 Inhaler 3  . carvedilol (COREG) 6.25 MG tablet Take 1 tablet (6.25 mg total) by mouth 2 (two) times daily with a meal. 60 tablet 11  . clopidogrel (PLAVIX) 75 MG tablet Take 1 tablet (75 mg total) by mouth daily with breakfast. 30 tablet 11  . furosemide (LASIX) 40 MG tablet Take 0.5-1 tablets (20-40 mg total) by mouth daily. (Patient taking differently: Take 40 mg by mouth 2 (two) times daily. ) 90 tablet 3  . glimepiride (AMARYL) 2 MG tablet Take 1 tablet (2 mg total) by mouth daily before breakfast. 30 tablet 3  . JANUMET XR [phone] MG TB24 TAKE ONE TABLET BY MOUTH AT BEDTIME  30 tablet 0  . losartan (COZAAR) 25 MG tablet Take 1 tablet (25 mg total) by mouth daily. 30 tablet 11  . spironolactone (ALDACTONE) 25 MG tablet Take 1 tablet (25 mg total) by mouth daily. 30 tablet 11  . Guaifenesin (MUCINEX MAXIMUM STRENGTH) 1200 MG TB12 Take 1  tablet (1,200 mg total) by mouth every 12 (twelve) hours as needed. (Patient not taking: Reported on 08/12/2014) 14 tablet 1   No current facility-administered medications for this visit.    Allergies: No Known Allergies  Social History: The patient  reports that he has never smoked. His smokeless tobacco use includes Chew. He reports that he drinks alcohol. He reports that he does not use illicit drugs.   Family History: The patient's family history includes Diabetes in his father and mother; Hyperlipidemia in his father; Hypertension in his father and mother.   Review of Systems: Please see the history of present illness.   Otherwise, the review of systems is positive for visual disturbance, DOE, back pain,  muscle pain, snoring and chest pressure.   All other systems are reviewed and negative.   Physical Exam: VS:  BP 110/80 mmHg  Pulse 99  Ht _0  (1.778 m)  Wt 225 lb (102.059 kg)  BMI 32.28 kg/m2  SpO2 96% .  BMI Body mass index is 32.28 kg/(m^2).  Wt Readings from Last 3 Encounters:  08/12/14 225 lb (102.059 kg)  08/06/14 221 lb (100.245 kg)  07/22/14 218 lb 4.1 oz (99 kg)    General: Pleasant. Well developed, well nourished and in no acute distress.  HEENT: Normal. Neck: Supple, no JVD, carotid bruits, or masses noted.  Cardiac: Regular rate and rhythm. His heart tones are distant. No edema.  Respiratory:  Lungs are clear to auscultation bilaterally with normal work of breathing.  GI: Soft and nontender.  MS: No deformity or atrophy. Gait and ROM intact. Skin: Warm and dry. Color is normal.  Neuro:  Strength and sensation are intact and no gross focal deficits noted.  Psych: Alert, appropriate and with normal affect.   LABORATORY DATA:  EKG:  EKG is ordered today. EKG shows NSR with PVC, inferior Q's and poor R wave progression. His rate is 99.   Lab Results  Component Value Date   WBC 7.2 07/22/2014   HGB 14.7 07/22/2014   HCT 44.2 07/22/2014   PLT 228  07/22/2014   GLUCOSE 118* 08/06/2014   CHOL 145 07/21/2014   TRIG 173* 07/21/2014   HDL 35* 07/21/2014   LDLDIRECT 109.0 04/09/2013   LDLCALC 75 07/21/2014   ALT 22 07/17/2014   AST 24 07/17/2014   NA 139 08/06/2014   K 4.0 08/06/2014   CL 100 08/06/2014   CREATININE 1.38 08/06/2014   BUN 28* 08/06/2014   CO2 30 08/06/2014   TSH 2.025 08/17/2012   PSA 0.24 03/10/2010   INR 1.02 07/21/2014   HGBA1C 9.9* 07/18/2014   MICROALBUR 3.4* 04/09/2013    BNP (last 3 results)  Recent Labs  07/17/14 1545  BNP 422.4*    ProBNP (last 3 results) No results for input(s): PROBNP in the last 8760 hours.   Other Studies Reviewed Today:  Echo Study Conclusions from 07/2014  - Left ventricle: The cavity size was mildly dilated. Systolic function was severely reduced. The estimated ejection fraction was in the range of 20% to 25%. Severe diffuse hypokinesis. Although no diagnostic regional wall motion abnormality was identified, this possibility cannot be completely excluded on the basis of this study. Doppler parameters are consistent with a reversible restrictive pattern, indicative of decreased left ventricular diastolic compliance and/or increased left atrial pressure (grade 3 diastolic dysfunction). - Left atrium: The atrium was mildly to moderately dilated.   Cardiac Catheterization Procedure Note  Name: Jasin Brazel MRN: [id number] DOB: 1961-06-30  Procedure: Left Heart Cath, Selective Coronary Angiography, LIMA angiography, SVG angiography.   Indication: Acute systolic CHF, significant fall in LV EF, elevated troponin.   Procedural Findings: Hemodynamics:  AO 126/83 LV 119/33  Coronary angiography: Coronary dominance: right  Left mainstem: Minimal disease.   Left anterior descending (LAD): 80% mid LAD at D2. Small-moderate D2 with 80% ostial stenosis. Large D1 with long 80% proximal stenosis. Patent LIMA-LAD. Patent SVG-large  D1.   Left circumflex (LCx): 80% proximal stenosis proximal to the take-off of a large PLOM. There is a long up to 99% stenosed area in the proximal PLOM. Total occlusion of SVG-PLOM.   Right coronary artery (RCA): Total occlusion of the proximal RCA. There are left to  right collaterals that appear to primarily come from the LAD system. Total occlusion of SVG-RCA system.   Left ventriculography: Not done, elevated LVEDP and recent echo.   Final Conclusions: 2/4 patent grafts with SVG-PLOM and SVG-RCA system totally occluded. There are collaterals from the LAD system to the RCA and the RCA is totally occluded proximally. There are no collaterals to the LCx territory. Given significant fall in LV systolic function, will plan PCI to LCx and PLOM. Dr Irish Lack to perform.   Recommendations: After PCI, will need repeat echo in 3 months to assess for ICD.   With elevated LV EDP, will restart IV Lasix.   Loralie Champagne 07/21/2014, 12:28 PM    PROCEDURE: PCI Left circumflex  INDICATIONS: Acute systolic heart failure, elevated troponin, bypass graft closure, NSTEMI  The risks, benefits, and details of the procedure were explained to the patient. The patient verbalized understanding and wanted to proceed. Informed written consent was obtained.  PROCEDURE TECHNIQUE: The diagnostic catheterization was performed by Dr. [name]. This revealed severe disease in the native circumflex, up to 99% in the mid circumflex. There was a significant lesion more proximal as well. The segment with the 99% stenosis was a long segment of diffuse disease. Angiomax was used for anticoagulation. ACT was used to check that the Angiomax was therapeutic. A fielder XT wire was placed across the area of severe disease in the mid circumflex. A 2.0 x 20 balloon was used to predilate. A 2.25 x 38 Synergy stent was then deployed across the more distal area disease. A 2.5 x 16 Synergy stent was deployed in  overlapping fashion across the more proximal area disease. The distal stent was postdilated with a 2.5 x 12 noncompliant balloon. The more proximal area was postdilated with a 3.0 x 12 noncompliant balloon to high pressure. Several doses of nitroglycerin were administered intracoronary. The patient tolerated the procedure well. There was an excellent angiographic appearance. A vascade closure device was deployed for hemostasis.   CONTRAST: Total of 95 cc.  COMPLICATIONS: None.    IMPRESSIONS:  1. Successful PCI of the mid left circumflex artery with overlapping Synergy drug-eluting stents as noted above.   RECOMMENDATION: Continue dual antiplatelet therapy for at least a year. He will be watched overnight. We'll continue Angiomax since he only got the Plavix while he was on the cath table. Watch for any groin bleeding. Continue diuresis per Dr. [name].      Assessment/Plan:  1. Systolic heart failure - EF has fallen back to 20% - now back on a heart failure regimen - will increase his Coreg to 12.5 mg BID - he has been on target dose of 25 mg in the past. Will work towards that goal. Try to titrate ARB as well. BP may be a limiting factor.   2. Ischemic CM -  EF down to 20%.   3. CAD - with failure of 2 grafts - s/p PCI to the LCX - on DAPT -  No recurrent chest pain  4. VT - noted during last hospitalization. Given his issues I think Life Vest needs to be offered to him while we are waiting these next 90 days. Order has been placed.   5. Noncompliance - I have had a long talk with him and explained that we as providers can only do so much and that we need his cooperation. He seems motivated to comply.   6. OSA - was to be set up with pulmonary sleep medicine for follow up.  Current medicines are reviewed with the patient today.  The patient does not have concerns regarding medicines other than what has been noted above.  The following changes have been made:  See  above.  Labs/ tests ordered today include:   No orders of the defined types were placed in this encounter.     Disposition:   FU with me on February 29th.   Patient is agreeable to this plan and will call if any problems develop in the interim.   Signed: Burtis Junes, RN, ANP-C 08/12/2014 10:10 AM  [facility] [address] [place],   [address] Phone: [phone] Fax: [phone]

## 2014-08-12 NOTE — Patient Instructions (Addendum)
We will be checking the following labs today BMET & CBC  Stay on your current medicines but I am increasing the Coreg to 12.5 mg twice a day - I have sent in a RX for the 25 mg tablet - you need to break this in half and take just a half a pill twice a day until I see you back  I will see you on Feb 29 at 9:30 am - please arrive at 9:15 am  Restrict your salt - to less than 1500 mg per day  Weigh daily  Extra dose of Lasix for weight gain of 2 or more pounds overnight  I would like for you to consider a Life Vest  We will refer you back to Dr. Danton Sewer for sleep/CPAP issues (he has seen him in the past)  Call the [place] office at [phone] if you have any questions, problems or concerns.

## 2014-08-13 ENCOUNTER — Encounter: Payer: Self-pay | Admitting: Nurse Practitioner

## 2014-08-13 ENCOUNTER — Telehealth: Payer: Self-pay | Admitting: Nurse Practitioner

## 2014-08-13 NOTE — Telephone Encounter (Signed)
New message      Pt saw Vincent Chandler yesterday, she told him someone would be calling him to schedule an fitting for a life vest.  No one has called.

## 2014-08-13 NOTE — Telephone Encounter (Signed)
Contacted the pt that I spoke with Claiborne Billings in EP about the pt needing to schedule a time for her to come and fit him for his Life Vest, and per [place] the hold up with having this done is due to the pts insurance [place] showing the pts insurance is inactive.  Informed the pt that per Claiborne Billings he should contact his insurance and have them fax some kind of information that states the pts current policy is active.  Informed the pt that per Claiborne Billings, once the insurance information is given to our office showing an active policy, then Claiborne Billings has it in place to come and fit him for his life vest.  Pt verbalized understanding and agrees with this plan, stating he will call his insurance company now.

## 2014-08-21 ENCOUNTER — Other Ambulatory Visit: Payer: Self-pay | Admitting: Family Medicine

## 2014-08-24 ENCOUNTER — Telehealth: Payer: Self-pay | Admitting: *Deleted

## 2014-08-24 NOTE — Telephone Encounter (Signed)
Prior authorization for Janumet XR initiated. Awaiting determination. JG//CMA

## 2014-08-25 NOTE — Telephone Encounter (Signed)
PA approved 08/24/2014 through 07/02/2038. JG//CMA

## 2014-08-31 ENCOUNTER — Ambulatory Visit (INDEPENDENT_AMBULATORY_CARE_PROVIDER_SITE_OTHER): Payer: BLUE CROSS/BLUE SHIELD | Admitting: Nurse Practitioner

## 2014-08-31 ENCOUNTER — Other Ambulatory Visit: Payer: Self-pay

## 2014-08-31 ENCOUNTER — Encounter: Payer: Self-pay | Admitting: Nurse Practitioner

## 2014-08-31 VITALS — BP 112/72 | HR 102 | Ht 70.0 in | Wt 221.0 lb

## 2014-08-31 DIAGNOSIS — I4891 Unspecified atrial fibrillation: Secondary | ICD-10-CM

## 2014-08-31 DIAGNOSIS — I2510 Atherosclerotic heart disease of native coronary artery without angina pectoris: Secondary | ICD-10-CM

## 2014-08-31 DIAGNOSIS — I25810 Atherosclerosis of coronary artery bypass graft(s) without angina pectoris: Secondary | ICD-10-CM

## 2014-08-31 DIAGNOSIS — I472 Ventricular tachycardia, unspecified: Secondary | ICD-10-CM

## 2014-08-31 DIAGNOSIS — Z955 Presence of coronary angioplasty implant and graft: Secondary | ICD-10-CM

## 2014-08-31 DIAGNOSIS — I255 Ischemic cardiomyopathy: Secondary | ICD-10-CM

## 2014-08-31 DIAGNOSIS — I5022 Chronic systolic (congestive) heart failure: Secondary | ICD-10-CM

## 2014-08-31 LAB — BASIC METABOLIC PANEL
BUN: 30 mg/dL — ABNORMAL HIGH (ref 6–23)
CO2: 24 mEq/L (ref 19–32)
Calcium: 9.2 mg/dL (ref 8.4–10.5)
Chloride: 100 mEq/L (ref 96–112)
Creatinine, Ser: 1.45 mg/dL (ref 0.40–1.50)
GFR: 54.09 mL/min — ABNORMAL LOW (ref >60.00)
Glucose, Bld: 296 mg/dL — ABNORMAL HIGH (ref 70–99)
Potassium: 4 mEq/L (ref 3.5–5.1)
Sodium: 133 mEq/L — ABNORMAL LOW (ref 135–145)

## 2014-08-31 MED ORDER — CARVEDILOL 25 MG PO TABS: 25.0000 mg | ORAL_TABLET | Freq: Two times a day (BID) | ORAL | Status: DC

## 2014-08-31 MED ORDER — FUROSEMIDE 40 MG PO TABS: 40.0000 mg | ORAL_TABLET | Freq: Every day | ORAL | Status: DC

## 2014-08-31 NOTE — Patient Instructions (Addendum)
We will be checking the following labs today BMET  Stay on your current medicines but I am cutting the Furosemide back to just 40 mg each AM  Increase the Coreg to 25 mg two times a day  Let me know if your dizziness gets worse  Keep restricting your salt and weighing daily  See me on March 15th  Call the [place] office at [phone] if you have any questions, problems or concerns.

## 2014-08-31 NOTE — Progress Notes (Signed)
CARDIOLOGY OFFICE NOTE  Date:  08/31/2014    Vincent Chandler Date of Birth: December 10, 1960 Medical Record #[id number]  PCP:  Garnet Koyanagi, DO  Cardiologist:  Allegra Lai    Chief Complaint  Patient presents with  . Congestive Heart Failure    2 week check - seen for Allegra Lai    History of Present Illness: Vincent Chandler is a 54 y.o. male who presents today for a 2 week check. Seen for Dr. Caryl Comes and Dr. [name]. He has a known history of CAD s/p 4 vessel CABG back in 2014, hypertension, diabetes mellitus, obesity, OSA, hyperlipidemia, post cabg afib, anxiety, kidney stones, on CPAP, asthma, pneumonia x 3 and a history of noncompliance.   I have not seen him in over a year until 2 weeks ago.    He presented mid January with acute respiratory distress and VT. He apparently had run out of several of his medications and was not restricting his salt intake. He developed respiratory distress at home and went to his PCP where due to his hypoxia on the pulse ox in 80s he was sent to [facility] ED for evaluation. There he was found to have VT with 45 beats which he was not aware of and thus was transferred to [facility] for further care. He was diuresed. Troponin was elevated at 0.07. Echo revealed that his EF had dropped further to 20-25%. G3DD. Severe diffuse hypokinesis. He underwent left heart cath revealing 2/4 patent grafts with SVG-PLOM and SVG-RCA system totally occluded. There are collaterals from the LAD system to the RCA and the RCA is totally occluded proximally. There are no collaterals to the LCx territory. He then underwent successful PCI of the mid left circumflex artery with overlapping Synergy drug-eluting stents (See cath reports below). He was placed on plavix. P2Y12 showed the plavix was working appropriately. Repeat echo planned for three months. If no improvement in LVEF, he will need consideration for ICD once again.   He was to have already seen Dr. [name] back - he  cancelled that visit - he has wished to come back and see me.   I saw him 2 weeks ago - long discussion about need to make his health a priority. He seemed motivated to get back on track with taking care of himself. Life Vest was placed due to his low EF. He was very confused about what had happened to him and I cleared those issues up. He had hired someone to cook his meals. Titrating medicines back up with plans for repeat echo 3 months post PCI.    Comes back today. Here alone. Doing ok. No chest pain. Not short of breath. Brought his Life Vest back - too hard to wear - says it was not covered by insurance? No palpitations. Very brief spells of dizziness - happens if he gets up too quick. No chest pain. Not short of breath. Weight is staying down. He is paying $120 a week for his meals to be cooked - low salt, heart healthy. This is going well for him. Has had some "warm" sensations over his left chest - he has had this ever since his heart surgery. He has been taking 40 mg of Lasix in the am and 20 mg in the PM.    Past Medical History  Diagnosis Date  . Coronary artery disease     drug eluting stent RCA 2005-EF 30%- s/p CABG x 4; 2/4 patent grafts with SVG-PLOM and SVG-RCA  system totally occluded. There are collaterals from the LAD system to the RCA and the RCA is totally occluded proximally. There are no collaterals to the LCx territory. He then underwent successful PCI of the mid left circumflex artery with overlapping Synergy drug-eluting stents  . Cardiomyopathy     alcohol use related  . Asthma   . Hypertension   . Urinary incontinence   . Hyperlipidemia   . History of hypogonadism   . Pneumonia 02/16/2012    "today makes third time in my life"  . OSA on CPAP 02/16/2012    "wear mask sometimes"  . H/O hiatal hernia   . GERD (gastroesophageal reflux disease)   . Migraines 02/16/2012    "used to"  . Anxiety   . Diabetes mellitus type II     Type 2 NIDDM x 7 years  . History of  kidney stones 06/2012  . Atrial fibrillation-postoperative 11/28/2012    Past Surgical History  Procedure Laterality Date  . Tonsillectomy  ~ 1970  . Coronary angioplasty with stent placement  ~ 2003  . Refractive surgery  1990's    bilaterally  . Coronary artery bypass graft N/A 08/15/2012    Procedure: CORONARY ARTERY BYPASS GRAFTING (CABG);  Surgeon: Ivin Poot, MD;  Location: [place];  Service: Open Heart Surgery;  Laterality: N/A;  Coronary Artery Bypass Grafting Times Four Using Left Internal Mammary Artery and Right Saphenous Leg Vein Harvested Endoscopically  . Left heart catheterization with coronary angiogram N/A 08/08/2012    Procedure: LEFT HEART CATHETERIZATION WITH CORONARY ANGIOGRAM;  Surgeon: Peter M Martinique, MD;  Location: [facility] CATH LAB;  Service: Cardiovascular;  Laterality: N/A;  . Left heart catheterization with coronary/graft angiogram N/A 07/21/2014    Procedure: LEFT HEART CATHETERIZATION WITH Beatrix Fetters;  Surgeon: Larey Dresser, MD;  Location: [facility] CATH LAB;  Service: Cardiovascular;  Laterality: N/A;  . Percutaneous coronary stent intervention (pci-s)  07/21/2014    Procedure: PERCUTANEOUS CORONARY STENT INTERVENTION (PCI-S);  Surgeon: Larey Dresser, MD;  Location: [facility] CATH LAB;  Service: Cardiovascular;;     Medications: Current Outpatient Prescriptions  Medication Sig Dispense Refill  . aspirin EC 81 MG tablet Take 1 tablet (81 mg total) by mouth daily.    Marland Kitchen atorvastatin (LIPITOR) 20 MG tablet Take 1 tablet (20 mg total) by mouth at bedtime. 30 tablet 2  . budesonide-formoterol (SYMBICORT) 160-4.5 MCG/ACT inhaler Inhale 2 puffs into the lungs 2 (two) times daily. 1 Inhaler 3  . carvedilol (COREG) 25 MG tablet Take 0.5 tablets (12.5 mg total) by mouth 2 (two) times daily. Take 1/2 tablet twice a day until seen back in follow up 180 tablet 3  . clopidogrel (PLAVIX) 75 MG tablet Take 1 tablet (75 mg total) by mouth daily with breakfast. 30 tablet 11  .  furosemide (LASIX) 40 MG tablet Take 0.5-1 tablets (20-40 mg total) by mouth daily. (Patient taking differently: Take 40 mg by mouth 2 (two) times daily. ) 90 tablet 3  . glimepiride (AMARYL) 2 MG tablet Take 1 tablet (2 mg total) by mouth daily before breakfast. 30 tablet 3  . Guaifenesin (MUCINEX MAXIMUM STRENGTH) 1200 MG TB12 Take 1 tablet (1,200 mg total) by mouth every 12 (twelve) hours as needed. 14 tablet 1  . JANUMET XR [phone] MG TB24 take one tablet by mouth at bedtime 30 tablet 2  . losartan (COZAAR) 25 MG tablet Take 1 tablet (25 mg total) by mouth daily. 30 tablet 11  . nitroGLYCERIN (NITROSTAT)  0.4 MG SL tablet Place 1 tablet (0.4 mg total) under the tongue every 5 (five) minutes as needed for chest pain. 25 tablet 3  . spironolactone (ALDACTONE) 25 MG tablet Take 1 tablet (25 mg total) by mouth daily. 30 tablet 11   No current facility-administered medications for this visit.    Allergies: No Known Allergies  Social History: The patient  reports that he has never smoked. His smokeless tobacco use includes Chew. He reports that he drinks alcohol. He reports that he does not use illicit drugs.   Family History: The patient's family history includes Diabetes in his father and mother; Hyperlipidemia in his father; Hypertension in his father and mother.   Review of Systems: Please see the history of present illness.   Otherwise, the review of systems is positive for dizziness and visual disturbance.   All other systems are reviewed and negative.   Physical Exam: VS:  BP 112/72 mmHg  Pulse 102  Ht _0  (1.778 m)  Wt 221 lb (100.245 kg)  BMI 31.71 kg/m2  SpO2 93% .  BMI Body mass index is 31.71 kg/(m^2).  Wt Readings from Last 3 Encounters:  08/31/14 221 lb (100.245 kg)  08/12/14 225 lb (102.059 kg)  08/06/14 221 lb (100.245 kg)    General: Pleasant. Well developed, well nourished and in no acute distress.  HEENT: Normal. Neck: Supple, no JVD, carotid bruits, or  masses noted.  Cardiac: Regular rate and rhythm. No murmurs, rubs, or gallops. No edema.  Respiratory:  Lungs are clear to auscultation bilaterally with normal work of breathing.  GI: Soft and nontender.  MS: No deformity or atrophy. Gait and ROM intact. Skin: Warm and dry. Color is normal.  Neuro:  Strength and sensation are intact and no gross focal deficits noted.  Psych: Alert, appropriate and with normal affect.   LABORATORY DATA:  EKG:  EKG is not ordered today.   Lab Results  Component Value Date   WBC 6.9 08/12/2014   HGB 15.0 08/12/2014   HCT 44.7 08/12/2014   PLT 222.0 08/12/2014   GLUCOSE 259* 08/12/2014   CHOL 145 07/21/2014   TRIG 173* 07/21/2014   HDL 35* 07/21/2014   LDLDIRECT 109.0 04/09/2013   LDLCALC 75 07/21/2014   ALT 22 07/17/2014   AST 24 07/17/2014   NA 134* 08/12/2014   K 3.7 08/12/2014   CL 99 08/12/2014   CREATININE 1.32 08/12/2014   BUN 30* 08/12/2014   CO2 26 08/12/2014   TSH 2.025 08/17/2012   PSA 0.24 03/10/2010   INR 1.02 07/21/2014   HGBA1C 9.9* 07/18/2014   MICROALBUR 3.4* 04/09/2013    BNP (last 3 results)  Recent Labs  07/17/14 1545  BNP 422.4*    ProBNP (last 3 results) No results for input(s): PROBNP in the last 8760 hours.   Other Studies Reviewed Today:  Echo Study Conclusions from 07/2014  - Left ventricle: The cavity size was mildly dilated. Systolic function was severely reduced. The estimated ejection fraction was in the range of 20% to 25%. Severe diffuse hypokinesis. Although no diagnostic regional wall motion abnormality was identified, this possibility cannot be completely excluded on the basis of this study. Doppler parameters are consistent with a reversible restrictive pattern, indicative of decreased left ventricular diastolic compliance and/or increased left atrial pressure (grade 3 diastolic dysfunction). - Left atrium: The atrium was mildly to moderately dilated.   Cardiac  Catheterization Procedure Note  Name: Vincent Chandler MRN: [id number] DOB: 1960/12/13  Procedure: Left  Heart Cath, Selective Coronary Angiography, LIMA angiography, SVG angiography.   Indication: Acute systolic CHF, significant fall in LV EF, elevated troponin.   Procedural Findings: Hemodynamics:  AO 126/83 LV 119/33  Coronary angiography: Coronary dominance: right  Left mainstem: Minimal disease.   Left anterior descending (LAD): 80% mid LAD at D2. Small-moderate D2 with 80% ostial stenosis. Large D1 with long 80% proximal stenosis. Patent LIMA-LAD. Patent SVG-large D1.   Left circumflex (LCx): 80% proximal stenosis proximal to the take-off of a large PLOM. There is a long up to 99% stenosed area in the proximal PLOM. Total occlusion of SVG-PLOM.   Right coronary artery (RCA): Total occlusion of the proximal RCA. There are left to right collaterals that appear to primarily come from the LAD system. Total occlusion of SVG-RCA system.   Left ventriculography: Not done, elevated LVEDP and recent echo.   Final Conclusions: 2/4 patent grafts with SVG-PLOM and SVG-RCA system totally occluded. There are collaterals from the LAD system to the RCA and the RCA is totally occluded proximally. There are no collaterals to the LCx territory. Given significant fall in LV systolic function, will plan PCI to LCx and PLOM. Dr Irish Lack to perform.   Recommendations: After PCI, will need repeat echo in 3 months to assess for ICD.   With elevated LV EDP, will restart IV Lasix.   Vincent Chandler 07/21/2014, 12:28 PM  PROCEDURE: PCI Left circumflex  INDICATIONS: Acute systolic heart failure, elevated troponin, bypass graft closure, NSTEMI  The risks, benefits, and details of the procedure were explained to the patient. The patient verbalized understanding and wanted to proceed. Informed written consent was obtained.  PROCEDURE TECHNIQUE: The diagnostic  catheterization was performed by Dr. [name]. This revealed severe disease in the native circumflex, up to 99% in the mid circumflex. There was a significant lesion more proximal as well. The segment with the 99% stenosis was a long segment of diffuse disease. Angiomax was used for anticoagulation. ACT was used to check that the Angiomax was therapeutic. A fielder XT wire was placed across the area of severe disease in the mid circumflex. A 2.0 x 20 balloon was used to predilate. A 2.25 x 38 Synergy stent was then deployed across the more distal area disease. A 2.5 x 16 Synergy stent was deployed in overlapping fashion across the more proximal area disease. The distal stent was postdilated with a 2.5 x 12 noncompliant balloon. The more proximal area was postdilated with a 3.0 x 12 noncompliant balloon to high pressure. Several doses of nitroglycerin were administered intracoronary. The patient tolerated the procedure well. There was an excellent angiographic appearance. A vascade closure device was deployed for hemostasis.   CONTRAST: Total of 95 cc.  COMPLICATIONS: None.    IMPRESSIONS:  1. Successful PCI of the mid left circumflex artery with overlapping Synergy drug-eluting stents as noted above.   RECOMMENDATION: Continue dual antiplatelet therapy for at least a year. He will be watched overnight. We'll continue Angiomax since he only got the Plavix while he was on the cath table. Watch for any groin bleeding. Continue diuresis per Dr. [name].      Assessment/Plan:  1. Systolic heart failure - EF has fallen back to 20% - now back on a heart failure regimen - Coreg increased at his last visit but he has been on doses of 25 mg in the past. I have cut his Lasix back to just 40 mg in the AM. Increase Coreg to full 25 mg BID. See back  on March 15th. Try to increase Losartan on return.   2. Ischemic CM - EF down to 20%. Remains tachycardic with S3 gallop - try to increase his Coreg  further today. He is compensated.   3. CAD - with failure of 2 grafts - s/p PCI to the LCX - on DAPT - No recurrent chest pain  4. VT - noted during last hospitalization. Not able to wear the Life Vest - he will be sending back.   5. Noncompliance - He still seems motivated to comply.   6. OSA - was to be set up with pulmonary sleep medicine for follow up.   Current medicines are reviewed with the patient today.  The patient does not have concerns regarding medicines other than what has been noted above.  The following changes have been made:  See above.  Labs/ tests ordered today include:    Orders Placed This Encounter  Procedures  . Basic metabolic panel     Disposition:   FU with me on March 15th.    Patient is agreeable to this plan and will call if any problems develop in the interim.   Signed: Burtis Junes, RN, ANP-C 08/31/2014 9:41 AM  [facility] [place] [address] [place], [place]  [address] Phone: [phone] Fax: [phone]

## 2014-09-15 ENCOUNTER — Ambulatory Visit (INDEPENDENT_AMBULATORY_CARE_PROVIDER_SITE_OTHER): Payer: BLUE CROSS/BLUE SHIELD | Admitting: Nurse Practitioner

## 2014-09-15 ENCOUNTER — Encounter: Payer: Self-pay | Admitting: Nurse Practitioner

## 2014-09-15 ENCOUNTER — Other Ambulatory Visit (INDEPENDENT_AMBULATORY_CARE_PROVIDER_SITE_OTHER): Payer: BLUE CROSS/BLUE SHIELD | Admitting: *Deleted

## 2014-09-15 VITALS — BP 110/70 | HR 80 | Ht 70.0 in | Wt 219.0 lb

## 2014-09-15 DIAGNOSIS — Z955 Presence of coronary angioplasty implant and graft: Secondary | ICD-10-CM

## 2014-09-15 DIAGNOSIS — I4891 Unspecified atrial fibrillation: Secondary | ICD-10-CM

## 2014-09-15 DIAGNOSIS — I255 Ischemic cardiomyopathy: Secondary | ICD-10-CM

## 2014-09-15 DIAGNOSIS — I5022 Chronic systolic (congestive) heart failure: Secondary | ICD-10-CM

## 2014-09-15 LAB — BASIC METABOLIC PANEL
BUN: 28 mg/dL — ABNORMAL HIGH (ref 6–23)
CO2: 30 mEq/L (ref 19–32)
Calcium: 9.6 mg/dL (ref 8.4–10.5)
Chloride: 102 mEq/L (ref 96–112)
Creatinine, Ser: 1.37 mg/dL (ref 0.40–1.50)
GFR: 57.74 mL/min — ABNORMAL LOW (ref >60.00)
Glucose, Bld: 155 mg/dL — ABNORMAL HIGH (ref 70–99)
Potassium: 4.2 mEq/L (ref 3.5–5.1)
Sodium: 137 mEq/L (ref 135–145)

## 2014-09-15 NOTE — Patient Instructions (Addendum)
We will be checking the following labs today BMET  Echocardiogram after April 17th  Stay on current medicines  See me a few days after the echo for discussion  Call the [place] office at [phone] if you have any questions, problems or concerns.

## 2014-09-15 NOTE — Progress Notes (Signed)
CARDIOLOGY OFFICE NOTE  Date:  09/15/2014    Vincent Chandler Date of Birth: 05-Feb-1961 Medical Record #[id number]  PCP:  Garnet Koyanagi, DO  Cardiologist:  Allegra Lai    Chief Complaint  Patient presents with  . Congestive Heart Failure    Follow up visit - seen for Dr. Caryl Comes and [name]     History of Present Illness: Vincent Chandler is a 54 y.o. male who presents today for a follow up visit. Seen for Dr. Caryl Comes and Dr. [name]. He has a known history of CAD s/p 4 vessel CABG back in 2014, hypertension, diabetes mellitus, obesity, OSA, hyperlipidemia, post cabg afib, anxiety, kidney stones, on CPAP, asthma, pneumonia x 3 and a history of noncompliance.   I have not seen him in over a year until just several weeks ago.  He presented mid January of 2016 with acute respiratory distress and VT. He apparently had run out of several of his medications and was not restricting his salt intake. He developed respiratory distress at home and went to his PCP where due to his hypoxia on the pulse ox in 80s he was sent to [facility] ED for evaluation. There he was found to have VT with 45 beats which he was not aware of and thus was transferred to [facility] for further care. He was diuresed. Troponin was elevated at 0.07. Echo revealed that his EF had dropped further to 20-25%. G3DD. Severe diffuse hypokinesis. He underwent left heart cath revealing 2/4 patent grafts with SVG-PLOM and SVG-RCA system totally occluded. There are collaterals from the LAD system to the RCA and the RCA is totally occluded proximally. There are no collaterals to the LCx territory. He then underwent successful PCI of the mid left circumflex artery with overlapping Synergy drug-eluting stents (See cath reports below). He was placed on plavix. P2Y12 showed the plavix was working appropriately. Repeat echo planned for three months. If no improvement in LVEF, he will need consideration for ICD once again.   He was to have already  seen Dr. [name] back - he cancelled that visit - he has wished to come back and see me.   I saw him 4 weeks ago - long discussion about need to make his health a priority. He seemed motivated to get back on track with taking care of himself. Life Vest was placed due to his low EF. He was very confused about what had happened to him and I cleared those issues up. He had hired someone to cook his meals. Titrating medicines back up with plans for repeat echo 3 months post PCI.   Seen 2 weeks ago - titrating medicines again. He was not able to tolerate the Life Vest and sent it back.   Comes back today. Here alone. Doing ok. Little dizzy if he gets up too quickly. BP looks good. Weight down a few more pounds. Not short of breath. No swelling. No PND, orthopnea. Tolerating his medicines. Still has someone cooking his heart healthy meals.   Past Medical History  Diagnosis Date  . Coronary artery disease     drug eluting stent RCA 2005-EF 30%- s/p CABG x 4; 2/4 patent grafts with SVG-PLOM and SVG-RCA system totally occluded. There are collaterals from the LAD system to the RCA and the RCA is totally occluded proximally. There are no collaterals to the LCx territory. He then underwent successful PCI of the mid left circumflex artery with overlapping Synergy drug-eluting stents  . Cardiomyopathy  alcohol use related  . Asthma   . Hypertension   . Urinary incontinence   . Hyperlipidemia   . History of hypogonadism   . Pneumonia 02/16/2012    "today makes third time in my life"  . OSA on CPAP 02/16/2012    "wear mask sometimes"  . H/O hiatal hernia   . GERD (gastroesophageal reflux disease)   . Migraines 02/16/2012    "used to"  . Anxiety   . Diabetes mellitus type II     Type 2 NIDDM x 7 years  . History of kidney stones 06/2012  . Atrial fibrillation-postoperative 11/28/2012    Past Surgical History  Procedure Laterality Date  . Tonsillectomy  ~ 1970  . Coronary angioplasty with stent  placement  ~ 2003  . Refractive surgery  1990's    bilaterally  . Coronary artery bypass graft N/A 08/15/2012    Procedure: CORONARY ARTERY BYPASS GRAFTING (CABG);  Surgeon: Ivin Poot, MD;  Location: [facility];  Service: Open Heart Surgery;  Laterality: N/A;  Coronary Artery Bypass Grafting Times Four Using Left Internal Mammary Artery and Right Saphenous Leg Vein Harvested Endoscopically  . Left heart catheterization with coronary angiogram N/A 08/08/2012    Procedure: LEFT HEART CATHETERIZATION WITH CORONARY ANGIOGRAM;  Surgeon: Peter M Martinique, MD;  Location: [facility] CATH LAB;  Service: Cardiovascular;  Laterality: N/A;  . Left heart catheterization with coronary/graft angiogram N/A 07/21/2014    Procedure: LEFT HEART CATHETERIZATION WITH Beatrix Fetters;  Surgeon: Larey Dresser, MD;  Location: [facility] CATH LAB;  Service: Cardiovascular;  Laterality: N/A;  . Percutaneous coronary stent intervention (pci-s)  07/21/2014    Procedure: PERCUTANEOUS CORONARY STENT INTERVENTION (PCI-S);  Surgeon: Larey Dresser, MD;  Location: [facility] CATH LAB;  Service: Cardiovascular;;     Medications: Current Outpatient Prescriptions  Medication Sig Dispense Refill  . aspirin EC 81 MG tablet Take 1 tablet (81 mg total) by mouth daily.    Marland Kitchen atorvastatin (LIPITOR) 20 MG tablet Take 1 tablet (20 mg total) by mouth at bedtime. 30 tablet 2  . budesonide-formoterol (SYMBICORT) 160-4.5 MCG/ACT inhaler Inhale 2 puffs into the lungs 2 (two) times daily. 1 Inhaler 3  . carvedilol (COREG) 25 MG tablet Take 1 tablet (25 mg total) by mouth 2 (two) times daily. Take 1/2 tablet twice a day until seen back in follow up (Patient taking differently: Take 25 mg by mouth 2 (two) times daily. ) 180 tablet 3  . clopidogrel (PLAVIX) 75 MG tablet Take 1 tablet (75 mg total) by mouth daily with breakfast. 30 tablet 11  . furosemide (LASIX) 40 MG tablet Take 1 tablet (40 mg total) by mouth daily. 90 tablet 3  . glimepiride (AMARYL) 2 MG  tablet Take 1 tablet (2 mg total) by mouth daily before breakfast. 30 tablet 3  . Guaifenesin (MUCINEX MAXIMUM STRENGTH) 1200 MG TB12 Take 1 tablet (1,200 mg total) by mouth every 12 (twelve) hours as needed. 14 tablet 1  . JANUMET XR [phone] MG TB24 take one tablet by mouth at bedtime 30 tablet 2  . losartan (COZAAR) 25 MG tablet Take 1 tablet (25 mg total) by mouth daily. 30 tablet 11  . nitroGLYCERIN (NITROSTAT) 0.4 MG SL tablet Place 1 tablet (0.4 mg total) under the tongue every 5 (five) minutes as needed for chest pain. 25 tablet 3  . spironolactone (ALDACTONE) 25 MG tablet Take 1 tablet (25 mg total) by mouth daily. 30 tablet 11   No current facility-administered medications for  this visit.    Allergies: No Known Allergies  Social History: The patient  reports that he has never smoked. His smokeless tobacco use includes Chew. He reports that he drinks alcohol. He reports that he does not use illicit drugs.   Family History: The patient's family history includes Diabetes in his father and mother; Hyperlipidemia in his father; Hypertension in his father and mother.   Review of Systems: Please see the history of present illness.    All other systems are reviewed and negative.   Physical Exam: VS:  BP 110/70 mmHg  Pulse 80  Ht _0  (1.778 m)  Wt 219 lb (99.338 kg)  BMI 31.42 kg/m2 .  BMI Body mass index is 31.42 kg/(m^2).  Wt Readings from Last 3 Encounters:  09/15/14 219 lb (99.338 kg)  08/31/14 221 lb (100.245 kg)  08/12/14 225 lb (102.059 kg)    General: Pleasant. Well developed, well nourished and in no acute distress.  HEENT: Normal. Neck: Supple, no JVD, carotid bruits, or masses noted.  Cardiac: Regular rate and rhythm. No murmurs, rubs, or gallops. No edema.  Respiratory:  Lungs are clear to auscultation bilaterally with normal work of breathing.  GI: Soft and nontender.  MS: No deformity or atrophy. Gait and ROM intact. Skin: Warm and dry. Color is normal.    Neuro:  Strength and sensation are intact and no gross focal deficits noted.  Psych: Alert, appropriate and with normal affect.   LABORATORY DATA:  EKG:  EKG is not ordered today.   Lab Results  Component Value Date   WBC 6.9 08/12/2014   HGB 15.0 08/12/2014   HCT 44.7 08/12/2014   PLT 222.0 08/12/2014   GLUCOSE 296* 08/31/2014   CHOL 145 07/21/2014   TRIG 173* 07/21/2014   HDL 35* 07/21/2014   LDLDIRECT 109.0 04/09/2013   LDLCALC 75 07/21/2014   ALT 22 07/17/2014   AST 24 07/17/2014   NA 133* 08/31/2014   K 4.0 08/31/2014   CL 100 08/31/2014   CREATININE 1.45 08/31/2014   BUN 30* 08/31/2014   CO2 24 08/31/2014   TSH 2.025 08/17/2012   PSA 0.24 03/10/2010   INR 1.02 07/21/2014   HGBA1C 9.9* 07/18/2014   MICROALBUR 3.4* 04/09/2013    BNP (last 3 results)  Recent Labs  07/17/14 1545  BNP 422.4*    ProBNP (last 3 results) No results for input(s): PROBNP in the last 8760 hours.   Other Studies Reviewed Today:  Echo Study Conclusions from 07/2014  - Left ventricle: The cavity size was mildly dilated. Systolic function was severely reduced. The estimated ejection fraction was in the range of 20% to 25%. Severe diffuse hypokinesis. Although no diagnostic regional wall motion abnormality was identified, this possibility cannot be completely excluded on the basis of this study. Doppler parameters are consistent with a reversible restrictive pattern, indicative of decreased left ventricular diastolic compliance and/or increased left atrial pressure (grade 3 diastolic dysfunction). - Left atrium: The atrium was mildly to moderately dilated.   Cardiac Catheterization Procedure Note  Name: Vincent Chandler MRN: [id number] DOB: Nov 12, 1960  Procedure: Left Heart Cath, Selective Coronary Angiography, LIMA angiography, SVG angiography.   Indication: Acute systolic CHF, significant fall in LV EF, elevated troponin.   Procedural  Findings: Hemodynamics:  AO 126/83 LV 119/33  Coronary angiography: Coronary dominance: right  Left mainstem: Minimal disease.   Left anterior descending (LAD): 80% mid LAD at D2. Small-moderate D2 with 80% ostial stenosis. Large D1 with long 80% proximal  stenosis. Patent LIMA-LAD. Patent SVG-large D1.   Left circumflex (LCx): 80% proximal stenosis proximal to the take-off of a large PLOM. There is a long up to 99% stenosed area in the proximal PLOM. Total occlusion of SVG-PLOM.   Right coronary artery (RCA): Total occlusion of the proximal RCA. There are left to right collaterals that appear to primarily come from the LAD system. Total occlusion of SVG-RCA system.   Left ventriculography: Not done, elevated LVEDP and recent echo.   Final Conclusions: 2/4 patent grafts with SVG-PLOM and SVG-RCA system totally occluded. There are collaterals from the LAD system to the RCA and the RCA is totally occluded proximally. There are no collaterals to the LCx territory. Given significant fall in LV systolic function, will plan PCI to LCx and PLOM. Dr Irish Lack to perform.   Recommendations: After PCI, will need repeat echo in 3 months to assess for ICD.   With elevated LV EDP, will restart IV Lasix.   Loralie Champagne 07/21/2014, 12:28 PM  PROCEDURE: PCI Left circumflex  INDICATIONS: Acute systolic heart failure, elevated troponin, bypass graft closure, NSTEMI  The risks, benefits, and details of the procedure were explained to the patient. The patient verbalized understanding and wanted to proceed. Informed written consent was obtained.  PROCEDURE TECHNIQUE: The diagnostic catheterization was performed by Dr. [name]. This revealed severe disease in the native circumflex, up to 99% in the mid circumflex. There was a significant lesion more proximal as well. The segment with the 99% stenosis was a long segment of diffuse disease. Angiomax was used for  anticoagulation. ACT was used to check that the Angiomax was therapeutic. A fielder XT wire was placed across the area of severe disease in the mid circumflex. A 2.0 x 20 balloon was used to predilate. A 2.25 x 38 Synergy stent was then deployed across the more distal area disease. A 2.5 x 16 Synergy stent was deployed in overlapping fashion across the more proximal area disease. The distal stent was postdilated with a 2.5 x 12 noncompliant balloon. The more proximal area was postdilated with a 3.0 x 12 noncompliant balloon to high pressure. Several doses of nitroglycerin were administered intracoronary. The patient tolerated the procedure well. There was an excellent angiographic appearance. A vascade closure device was deployed for hemostasis.   CONTRAST: Total of 95 cc.  COMPLICATIONS: None.    IMPRESSIONS:  1. Successful PCI of the mid left circumflex artery with overlapping Synergy drug-eluting stents as noted above.   RECOMMENDATION: Continue dual antiplatelet therapy for at least a year. He will be watched overnight. We'll continue Angiomax since he only got the Plavix while he was on the cath table. Watch for any groin bleeding. Continue diuresis per Dr. [name].      Assessment/Plan:  1. Systolic heart failure - EF has fallen back to 20% - now back on a heart failure regimen - he is doing well. Some dizziness with standing. BP lower. Will leave him on his current regimen. Check echo after April 17th. See back for discussion for possible ICD implant.   2. Ischemic CM - EF down to 20%. Looks compensated. Doing well clinically. Weight is down.   3. CAD - with failure of 2 grafts - s/p PCI to the LCX - on DAPT - No recurrent chest pain  4. VT - noted during last hospitalization. Not able to wear the Life Vest - he has sent back  5. Noncompliance - He still seems motivated to comply.   6. OSA -  was to be set up with pulmonary sleep medicine for follow up.   Current  medicines are reviewed with the patient today. The patient does not have concerns regarding medicines other than what has been noted above.   The following changes have been made:  See above.  Labs/ tests ordered today include:    Orders Placed This Encounter  Procedures  . 2D Echocardiogram without contrast     Disposition:   FU with me a few days after echo.   Patient is agreeable to this plan and will call if any problems develop in the interim.   Signed: Burtis Junes, RN, ANP-C 09/15/2014 10:37 AM  [place] [address] [place], [place]  [address] Phone: [phone] Fax: [phone]

## 2014-09-18 ENCOUNTER — Institutional Professional Consult (permissible substitution): Payer: BLUE CROSS/BLUE SHIELD | Admitting: Pulmonary Disease

## 2014-10-20 ENCOUNTER — Ambulatory Visit (HOSPITAL_COMMUNITY): Payer: BLUE CROSS/BLUE SHIELD | Attending: Cardiology | Admitting: Cardiology

## 2014-10-20 ENCOUNTER — Other Ambulatory Visit (HOSPITAL_COMMUNITY): Payer: BLUE CROSS/BLUE SHIELD | Admitting: *Deleted

## 2014-10-20 DIAGNOSIS — I255 Ischemic cardiomyopathy: Secondary | ICD-10-CM

## 2014-10-20 DIAGNOSIS — Z955 Presence of coronary angioplasty implant and graft: Secondary | ICD-10-CM

## 2014-10-20 DIAGNOSIS — I5022 Chronic systolic (congestive) heart failure: Secondary | ICD-10-CM | POA: Insufficient documentation

## 2014-10-20 MED ORDER — PERFLUTREN LIPID MICROSPHERE
1.0000 mL | INTRAVENOUS | Status: AC | PRN
  Administered 2014-10-20: 3 mL via INTRAVENOUS

## 2014-10-20 NOTE — Progress Notes (Signed)
Echocardiogram with [facility].  Vincent Chandler, RCS

## 2014-10-23 ENCOUNTER — Ambulatory Visit (INDEPENDENT_AMBULATORY_CARE_PROVIDER_SITE_OTHER): Payer: BLUE CROSS/BLUE SHIELD | Admitting: Nurse Practitioner

## 2014-10-23 ENCOUNTER — Encounter: Payer: Self-pay | Admitting: Nurse Practitioner

## 2014-10-23 VITALS — BP 98/70 | HR 85 | Ht 70.0 in | Wt 222.0 lb

## 2014-10-23 DIAGNOSIS — I472 Ventricular tachycardia, unspecified: Secondary | ICD-10-CM

## 2014-10-23 DIAGNOSIS — Z955 Presence of coronary angioplasty implant and graft: Secondary | ICD-10-CM | POA: Diagnosis not present

## 2014-10-23 DIAGNOSIS — IMO0002 Reserved for concepts with insufficient information to code with codable children: Secondary | ICD-10-CM

## 2014-10-23 DIAGNOSIS — I5022 Chronic systolic (congestive) heart failure: Secondary | ICD-10-CM | POA: Diagnosis not present

## 2014-10-23 DIAGNOSIS — E1165 Type 2 diabetes mellitus with hyperglycemia: Secondary | ICD-10-CM

## 2014-10-23 DIAGNOSIS — I255 Ischemic cardiomyopathy: Secondary | ICD-10-CM

## 2014-10-23 LAB — BASIC METABOLIC PANEL
BUN: 26 mg/dL — ABNORMAL HIGH (ref 6–23)
CO2: 30 mEq/L (ref 19–32)
Calcium: 9.8 mg/dL (ref 8.4–10.5)
Chloride: 101 mEq/L (ref 96–112)
Creatinine, Ser: 1.46 mg/dL (ref 0.40–1.50)
GFR: 53.63 mL/min — ABNORMAL LOW (ref >60.00)
Glucose, Bld: 140 mg/dL — ABNORMAL HIGH (ref 70–99)
Potassium: 4.3 mEq/L (ref 3.5–5.1)
Sodium: 137 mEq/L (ref 135–145)

## 2014-10-23 MED ORDER — CLOPIDOGREL BISULFATE 75 MG PO TABS: 75.0000 mg | ORAL_TABLET | Freq: Every day | ORAL | Status: DC

## 2014-10-23 NOTE — Addendum Note (Signed)
Addended by: Burtis Junes on: 10/23/2014 10:52 AM   Modules accepted: Orders

## 2014-10-23 NOTE — Progress Notes (Addendum)
CARDIOLOGY OFFICE NOTE  Date:  10/23/2014    Vincent Chandler Date of Birth: 06/20/1961 Medical Record #[id number]  PCP:  Garnet Koyanagi, DO  Cardiologist:  Derotha Fishbaugh/Klein/McLean    Chief Complaint  Patient presents with  . Cardiomyopathy    Follow up visit - seen for Dr. Allegra Lai     History of Present Illness: Vincent Chandler is a 54 y.o. male who presents today for a follow up visit. Seen for Dr. Caryl Comes and Dr. [name]. He has a known history of CAD s/p 4 vessel CABG back in 2014, hypertension, diabetes mellitus, obesity, OSA, hyperlipidemia, post cabg afib, anxiety, kidney stones, on CPAP, asthma, pneumonia x 3 and a history of noncompliance.   I had not seen him in over a year until earlier this year.  He presented mid January of 2016 with acute respiratory distress and VT. He apparently had run out of several of his medications and was not restricting his salt intake. He developed respiratory distress at home and went to his PCP where due to his hypoxia on the pulse ox in 80s he was sent to [facility] ED for evaluation. There he was found to have VT with 45 beats which he was not aware of and thus was transferred to [facility] for further care. He was diuresed. Troponin was elevated at 0.07. Echo revealed that his EF had dropped further to 20-25%. G3DD. Severe diffuse hypokinesis. He underwent left heart cath revealing 2/4 patent grafts with SVG-PLOM and SVG-RCA system totally occluded. There are collaterals from the LAD system to the RCA and the RCA is totally occluded proximally. There are no collaterals to the LCx territory. He then underwent successful PCI of the mid left circumflex artery with overlapping Synergy drug-eluting stents (See cath reports below). He was placed on plavix. P2Y12 showed the plavix was working appropriately. Repeat echo planned for three months. If no improvement in LVEF, he will need consideration for ICD once again.   He was to have already seen Dr. [name]  back - he cancelled that visit - he has wished to come back and see me.   I have seen him back several times since his admission from January.   He seemed motivated to get back on track with taking care of himself. Life Vest was placed due to his low EF but he was not able to tolerate.  He has hired someone to [name] his meals.   Comes back today. Here alone. He is doing ok and feels pretty well. No chest pain. Breathing is good. No swelling. Very little dizziness. BP is lower. No palpitations. No syncope. He continues to have someone who is cooking his more healthier meals now. Has been to the beach recently and got a little off track with his diabetes.   Past Medical History  Diagnosis Date  . Coronary artery disease     drug eluting stent RCA 2005-EF 30%- s/p CABG x 4; 2/4 patent grafts with SVG-PLOM and SVG-RCA system totally occluded. There are collaterals from the LAD system to the RCA and the RCA is totally occluded proximally. There are no collaterals to the LCx territory. He then underwent successful PCI of the mid left circumflex artery with overlapping Synergy drug-eluting stents  . Cardiomyopathy     alcohol use related  . Asthma   . Hypertension   . Urinary incontinence   . Hyperlipidemia   . History of hypogonadism   . Pneumonia 02/16/2012    "today makes third time  in my life"  . OSA on CPAP 02/16/2012    "wear mask sometimes"  . H/O hiatal hernia   . GERD (gastroesophageal reflux disease)   . Migraines 02/16/2012    "used to"  . Anxiety   . Diabetes mellitus type II     Type 2 NIDDM x 7 years  . History of kidney stones 06/2012  . Atrial fibrillation-postoperative 11/28/2012    Past Surgical History  Procedure Laterality Date  . Tonsillectomy  ~ 1970  . Coronary angioplasty with stent placement  ~ 2003  . Refractive surgery  1990's    bilaterally  . Coronary artery bypass graft N/A 08/15/2012    Procedure: CORONARY ARTERY BYPASS GRAFTING (CABG);  Surgeon: Ivin Poot, MD;  Location: [place];  Service: Open Heart Surgery;  Laterality: N/A;  Coronary Artery Bypass Grafting Times Four Using Left Internal Mammary Artery and Right Saphenous Leg Vein Harvested Endoscopically  . Left heart catheterization with coronary angiogram N/A 08/08/2012    Procedure: LEFT HEART CATHETERIZATION WITH CORONARY ANGIOGRAM;  Surgeon: Peter M Martinique, MD;  Location: [facility] CATH LAB;  Service: Cardiovascular;  Laterality: N/A;  . Left heart catheterization with coronary/graft angiogram N/A 07/21/2014    Procedure: LEFT HEART CATHETERIZATION WITH Beatrix Fetters;  Surgeon: Larey Dresser, MD;  Location: [facility] CATH LAB;  Service: Cardiovascular;  Laterality: N/A;  . Percutaneous coronary stent intervention (pci-s)  07/21/2014    Procedure: PERCUTANEOUS CORONARY STENT INTERVENTION (PCI-S);  Surgeon: Larey Dresser, MD;  Location: [facility] CATH LAB;  Service: Cardiovascular;;     Medications: Current Outpatient Prescriptions  Medication Sig Dispense Refill  . aspirin EC 81 MG tablet Take 1 tablet (81 mg total) by mouth daily.    Marland Kitchen atorvastatin (LIPITOR) 20 MG tablet Take 1 tablet (20 mg total) by mouth at bedtime. 30 tablet 2  . budesonide-formoterol (SYMBICORT) 160-4.5 MCG/ACT inhaler Inhale 2 puffs into the lungs 2 (two) times daily. 1 Inhaler 3  . carvedilol (COREG) 25 MG tablet Take 1 tablet (25 mg total) by mouth 2 (two) times daily. Take 1/2 tablet twice a day until seen back in follow up (Patient taking differently: Take 25 mg by mouth 2 (two) times daily. ) 180 tablet 3  . clopidogrel (PLAVIX) 75 MG tablet Take 1 tablet (75 mg total) by mouth daily with breakfast. 90 tablet 3  . furosemide (LASIX) 40 MG tablet Take 1 tablet (40 mg total) by mouth daily. 90 tablet 3  . glimepiride (AMARYL) 2 MG tablet Take 1 tablet (2 mg total) by mouth daily before breakfast. 30 tablet 3  . Guaifenesin (MUCINEX MAXIMUM STRENGTH) 1200 MG TB12 Take 1 tablet (1,200 mg total) by mouth every 12 (twelve)  hours as needed. 14 tablet 1  . JANUMET XR [phone] MG TB24 take one tablet by mouth at bedtime 30 tablet 2  . losartan (COZAAR) 25 MG tablet Take 1 tablet (25 mg total) by mouth daily. 30 tablet 11  . nitroGLYCERIN (NITROSTAT) 0.4 MG SL tablet Place 1 tablet (0.4 mg total) under the tongue every 5 (five) minutes as needed for chest pain. 25 tablet 3  . spironolactone (ALDACTONE) 25 MG tablet Take 1 tablet (25 mg total) by mouth daily. 30 tablet 11   No current facility-administered medications for this visit.    Allergies: No Known Allergies  Social History: The patient  reports that he has never smoked. His smokeless tobacco use includes Chew. He reports that he drinks alcohol. He reports that he  does not use illicit drugs.   Family History: The patient's family history includes Diabetes in his father and mother; Hyperlipidemia in his father; Hypertension in his father and mother.   Review of Systems: Please see the history of present illness.   He has had some blurry vision, especially with watching TV.   All other systems are reviewed and negative.   Physical Exam: VS:  BP 98/70 mmHg  Pulse 85  Ht _0  (1.778 m)  Wt 222 lb (100.699 kg)  BMI 31.85 kg/m2  SpO2 94% .  BMI Body mass index is 31.85 kg/(m^2).  Wt Readings from Last 3 Encounters:  10/23/14 222 lb (100.699 kg)  09/15/14 219 lb (99.338 kg)  08/31/14 221 lb (100.245 kg)    General: Pleasant. Well developed, well nourished and in no acute distress. He is obese.  HEENT: Normal. Neck: Supple, no JVD, carotid bruits, or masses noted.  Cardiac: Regular rate and rhythm. No murmurs, rubs, or gallops. No edema.  Respiratory:  Lungs are clear to auscultation bilaterally with normal work of breathing.  GI: Soft and nontender.  MS: No deformity or atrophy. Gait and ROM intact. Skin: Warm and dry. Color is normal.  Neuro:  Strength and sensation are intact and no gross focal deficits noted.  Psych: Alert, appropriate and  with normal affect.   LABORATORY DATA:  EKG:  EKG is not ordered today.    Lab Results  Component Value Date   WBC 6.9 08/12/2014   HGB 15.0 08/12/2014   HCT 44.7 08/12/2014   PLT 222.0 08/12/2014   GLUCOSE 155* 09/15/2014   CHOL 145 07/21/2014   TRIG 173* 07/21/2014   HDL 35* 07/21/2014   LDLDIRECT 109.0 04/09/2013   LDLCALC 75 07/21/2014   ALT 22 07/17/2014   AST 24 07/17/2014   NA 137 09/15/2014   K 4.2 09/15/2014   CL 102 09/15/2014   CREATININE 1.37 09/15/2014   BUN 28* 09/15/2014   CO2 30 09/15/2014   TSH 2.025 08/17/2012   PSA 0.24 03/10/2010   INR 1.02 07/21/2014   HGBA1C 9.9* 07/18/2014   MICROALBUR 3.4* 04/09/2013    BNP (last 3 results)  Recent Labs  07/17/14 1545  BNP 422.4*    ProBNP (last 3 results) No results for input(s): PROBNP in the last 8760 hours.   Other Studies Reviewed Today:  Cardiac Catheterization Procedure Note  Name: Skiler Olden MRN: [id number] DOB: 06-Feb-1961  Procedure: Left Heart Cath, Selective Coronary Angiography, LIMA angiography, SVG angiography.   Coronary angiography:  Left mainstem: Minimal disease.   Left anterior descending (LAD): 80% mid LAD at D2. Small-moderate D2 with 80% ostial stenosis. Large D1 with long 80% proximal stenosis. Patent LIMA-LAD. Patent SVG-large D1.   Left circumflex (LCx): 80% proximal stenosis proximal to the take-off of a large PLOM. There is a long up to 99% stenosed area in the proximal PLOM. Total occlusion of SVG-PLOM.   Right coronary artery (RCA): Total occlusion of the proximal RCA. There are left to right collaterals that appear to primarily come from the LAD system. Total occlusion of SVG-RCA system.   Left ventriculography: Not done, elevated LVEDP and recent echo.   Final Conclusions: 2/4 patent grafts with SVG-PLOM and SVG-RCA system totally occluded. There are collaterals from the LAD system to the RCA and the RCA is totally occluded proximally.  There are no collaterals to the LCx territory. Given significant fall in LV systolic function, will plan PCI to LCx and PLOM. Dr Irish Lack to  perform.   Recommendations: After PCI, will need repeat echo in 3 months to assess for ICD.   With elevated LV EDP, will restart IV Lasix.   Loralie Champagne 07/21/2014, 12:28 PM  PROCEDURE: PCI Left circumflex  INDICATIONS: Acute systolic heart failure, elevated troponin, bypass graft closure, NSTEMI  The risks, benefits, and details of the procedure were explained to the patient. The patient verbalized understanding and wanted to proceed. Informed written consent was obtained.  PROCEDURE TECHNIQUE: The diagnostic catheterization was performed by Dr. [name]. This revealed severe disease in the native circumflex, up to 99% in the mid circumflex. There was a significant lesion more proximal as well. The segment with the 99% stenosis was a long segment of diffuse disease. Angiomax was used for anticoagulation. ACT was used to check that the Angiomax was therapeutic. A fielder XT wire was placed across the area of severe disease in the mid circumflex. A 2.0 x 20 balloon was used to predilate. A 2.25 x 38 Synergy stent was then deployed across the more distal area disease. A 2.5 x 16 Synergy stent was deployed in overlapping fashion across the more proximal area disease. The distal stent was postdilated with a 2.5 x 12 noncompliant balloon. The more proximal area was postdilated with a 3.0 x 12 noncompliant balloon to high pressure. Several doses of nitroglycerin were administered intracoronary. The patient tolerated the procedure well. There was an excellent angiographic appearance. A vascade closure device was deployed for hemostasis.   CONTRAST: Total of 95 cc.  COMPLICATIONS: None.    IMPRESSIONS:  1. Successful PCI of the mid left circumflex artery with overlapping Synergy drug-eluting stents as noted above.   RECOMMENDATION:  Continue dual antiplatelet therapy for at least a year. He will be watched overnight. We'll continue Angiomax since he only got the Plavix while he was on the cath table. Watch for any groin bleeding. Continue diuresis per Dr. [name].    Echo Study Conclusions 10/20/2014 - Left ventricle: The cavity size was mildly dilated. Wall thickness was increased in a pattern of mild LVH. Systolic function was severely reduced. The estimated ejection fraction was in the range of 25% to 30%. Diffuse hypokinesis. There is akinesis of the inferolateral and inferior myocardium. Features are consistent with a pseudonormal left ventricular filling pattern, with concomitant abnormal relaxation and increased filling pressure (grade 2 diastolic dysfunction). - Mitral valve: There was mild regurgitation. - Left atrium: The atrium was mildly dilated. - Right ventricle: Systolic function was mildly reduced.  Impressions:  - Technically difficult; definity used; global hypokinesis with inferior and inferior lateral akinesis; overall severely reduced LV function; grade 2 diastolic dysfunction; mild LAE; mildly reduced RV function; mild MR; trace TR.   Assessment/Plan:  1. Systolic heart failure - EF has fallen back to 20% - it remains in this range based on recent echo. He will be referred back to Dr. Caryl Comes (he has seen him in the past) for ICD implant.   2. Ischemic CM - EF remains low. Looks compensated. Doing well clinically. I am referring him for ICD implant. I would like to see him back and try Entresto on return.  3. CAD - with failure of 2 grafts - s/p PCI to the LCX - on DAPT - No recurrent chest pain. Plavix is refilled today.   4. VT - noted during last hospitalization. Not able to wear the Life Vest - referring back to EP since EF has not recovered.   5. Noncompliance - He still seems  motivated to comply.   6. OSA - was to be set up with pulmonary sleep medicine for follow  up.   7. Diabetes - he is asking for referral - understands the need for better glucose control. Referring to Dr. Dwyane Dee.  Current medicines are reviewed with the patient today.  The patient does not have concerns regarding medicines other than what has been noted above.  The following changes have been made:  See above.  Labs/ tests ordered today include:    Orders Placed This Encounter  Procedures  . Basic metabolic panel  . Ambulatory referral to Cardiac Electrophysiology     Disposition:   Referring back to EP for ICD implant. I will see back in 6 weeks - hope to try Entresto. Refer to Endocrine per his request for his diabetes.   Patient is agreeable to this plan and will call if any problems develop in the interim.   Signed: Burtis Junes, RN, ANP-C 10/23/2014 9:50 AM  [facility] [address] [place], [place]  [address] Phone: [phone] Fax: [phone]        Addendum: On return, I plan on stopping his ARB. Since he is on low dose - will start Entresto 24/26 after 36 hour washout.

## 2014-10-23 NOTE — Patient Instructions (Addendum)
We will be checking the following labs today - BMET   Medication Instructions:    Continue with your current medicines.   I have refilled the Plavix today    Testing/Procedures To Be Arranged:   Referral to Dr. Caryl Comes for ICD implant -  Seeing Monday, April 25 at 2:00 pm  Referral to Dr. Darrow Bussing for diabetes  Follow-Up:   I will see you in 6 weeks   Other Special Instructions:    N/A  Call the [place] office at [phone] if you have any questions, problems or concerns.

## 2014-10-26 ENCOUNTER — Ambulatory Visit (INDEPENDENT_AMBULATORY_CARE_PROVIDER_SITE_OTHER): Payer: BLUE CROSS/BLUE SHIELD | Admitting: Internal Medicine

## 2014-10-26 ENCOUNTER — Encounter: Payer: Self-pay | Admitting: Internal Medicine

## 2014-10-26 VITALS — BP 98/56 | HR 82 | Ht 70.0 in | Wt 222.2 lb

## 2014-10-26 DIAGNOSIS — Z01812 Encounter for preprocedural laboratory examination: Secondary | ICD-10-CM

## 2014-10-26 DIAGNOSIS — I5022 Chronic systolic (congestive) heart failure: Secondary | ICD-10-CM | POA: Diagnosis not present

## 2014-10-26 NOTE — Patient Instructions (Signed)
Medication Instructions:  Your physician recommends that you continue on your current medications as directed. Please refer to the Current Medication list given to you today.  Labwork: None.  Vincent Chandler will call you to arrange pre-procedure labs.  Testing/Procedures: Your physician has recommended that you have a defibrillator inserted. An implantable cardioverter defibrillator (ICD) is a small device that is placed in your chest or, in rare cases, your abdomen. This device uses electrical pulses or shocks to help control life-threatening, irregular heartbeats that could lead the heart to suddenly stop beating (sudden cardiac arrest). Leads are attached to the ICD that goes into your heart. This is done in the hospital and usually requires an overnight stay.   Vincent Bradfield, RN will call you to arrange this procedure.  Follow-Up: To be scheduled when arrange procedure date  Any Other Special Instructions Will Be Listed Below (If Applicable).

## 2014-10-26 NOTE — Progress Notes (Signed)
Patient Care Team: Rosalita Chessman, DO as PCP - General (Family Medicine) Burtis Junes, NP as Nurse Practitioner (Nurse Practitioner)   HPI  Vincent Chandler is a 54 y.o. male Seen in followup for ischemic heart disease and sinus tachycardia. When he was seen last new Q waves were identified. There is a concern about an intercurrent myocardial infarction  He was admitted for Myoview scanning which demonstrated a new inferolateral infarct likely associated with occlusion of his RCA stent. There was no ischemia  Echo 2012 The estimated ejection fraction was in the range of 25% to 30%. Inferior posterior akinesis- Left atrium: The atrium was mildly dilated.   he underwent catheterization 2/14 .demonstrating severe 3 vessel disease with occlusion of the RCA stent. He underwent bypass surgeryx4. This was further complicated by atrial fibrillation with a rapid ventricular response for which he is treated with amiodarone.    2/16 >>Echo revealed that his EF had dropped further to 20-25%. G3DD. Severe diffuse hypokinesis. He underwent left heart cath revealing 2/4 patent grafts with SVG-PLOM and SVG-RCA system totally occluded. There are collaterals from the LAD system to the RCA and the RCA is totally occluded proximally. There are no collaterals to the LCx territory. He then underwent successful PCI of the mid left circumflex artery with overlapping Synergy drug-eluting stents ;   he was given a LifeVest.   repeat echocardiogram 4/16 demonstrated an ejection fraction of 25-30%.    Past Medical History  Diagnosis Date  . Coronary artery disease     drug eluting stent RCA 2005-EF 30%- s/p CABG x 4; 2/4 patent grafts with SVG-PLOM and SVG-RCA system totally occluded. There are collaterals from the LAD system to the RCA and the RCA is totally occluded proximally. There are no collaterals to the LCx territory. He then underwent successful PCI of the mid left circumflex artery with overlapping  Synergy drug-eluting stents  . Cardiomyopathy     alcohol use related  . Asthma   . Hypertension   . Urinary incontinence   . Hyperlipidemia   . History of hypogonadism   . Pneumonia 02/16/2012    "today makes third time in my life"  . OSA on CPAP 02/16/2012    "wear mask sometimes"  . H/O hiatal hernia   . GERD (gastroesophageal reflux disease)   . Migraines 02/16/2012    "used to"  . Anxiety   . Diabetes mellitus type II     Type 2 NIDDM x 7 years  . History of kidney stones 06/2012  . Atrial fibrillation-postoperative 11/28/2012    Past Surgical History  Procedure Laterality Date  . Tonsillectomy  ~ 1970  . Coronary angioplasty with stent placement  ~ 2003  . Refractive surgery  1990's    bilaterally  . Coronary artery bypass graft N/A 08/15/2012    Procedure: CORONARY ARTERY BYPASS GRAFTING (CABG);  Surgeon: Ivin Poot, MD;  Location: [place];  Service: Open Heart Surgery;  Laterality: N/A;  Coronary Artery Bypass Grafting Times Four Using Left Internal Mammary Artery and Right Saphenous Leg Vein Harvested Endoscopically  . Left heart catheterization with coronary angiogram N/A 08/08/2012    Procedure: LEFT HEART CATHETERIZATION WITH CORONARY ANGIOGRAM;  Surgeon: Peter M Martinique, MD;  Location: [facility] CATH LAB;  Service: Cardiovascular;  Laterality: N/A;  . Left heart catheterization with coronary/graft angiogram N/A 07/21/2014    Procedure: LEFT HEART CATHETERIZATION WITH Beatrix Fetters;  Surgeon: Larey Dresser, MD;  Location: [facility] CATH LAB;  Service: Cardiovascular;  Laterality: N/A;  . Percutaneous coronary stent intervention (pci-s)  07/21/2014    Procedure: PERCUTANEOUS CORONARY STENT INTERVENTION (PCI-S);  Surgeon: Larey Dresser, MD;  Location: [facility] CATH LAB;  Service: Cardiovascular;;    Current Outpatient Prescriptions  Medication Sig Dispense Refill  . aspirin EC 81 MG tablet Take 1 tablet (81 mg total) by mouth daily.    Marland Kitchen atorvastatin (LIPITOR) 20 MG tablet  Take 1 tablet (20 mg total) by mouth at bedtime. 30 tablet 2  . budesonide-formoterol (SYMBICORT) 160-4.5 MCG/ACT inhaler Inhale 2 puffs into the lungs 2 (two) times daily. 1 Inhaler 3  . carvedilol (COREG) 25 MG tablet Take 25 mg by mouth 2 (two) times daily with a meal.    . clopidogrel (PLAVIX) 75 MG tablet Take 1 tablet (75 mg total) by mouth daily with breakfast. 90 tablet 3  . furosemide (LASIX) 40 MG tablet Take 1 tablet (40 mg total) by mouth daily. 90 tablet 3  . glimepiride (AMARYL) 2 MG tablet Take 1 tablet (2 mg total) by mouth daily before breakfast. 30 tablet 3  . GUAIFENESIN 1200 PO Take 1 tablet by mouth daily.    Marland Kitchen JANUMET XR [phone] MG TB24 take one tablet by mouth at bedtime 30 tablet 2  . losartan (COZAAR) 25 MG tablet Take 1 tablet (25 mg total) by mouth daily. 30 tablet 11  . nitroGLYCERIN (NITROSTAT) 0.4 MG SL tablet Place 1 tablet (0.4 mg total) under the tongue every 5 (five) minutes as needed for chest pain. 25 tablet 3  . spironolactone (ALDACTONE) 25 MG tablet Take 1 tablet (25 mg total) by mouth daily. 30 tablet 11   No current facility-administered medications for this visit.    No Known Allergies  Review of Systems negative except from HPI and PMH  Physical Exam BP 98/56 mmHg  Pulse 82  Ht _0  (1.778 m)  Wt 222 lb 3.2 oz (100.789 kg)  BMI 31.88 kg/m2 BP 98/56 mmHg  Pulse 82  Ht _1  (1.778 m)  Wt 222 lb 3.2 oz (100.789 kg)  BMI 31.88 kg/m2  Well developed and well nourished in no acute distress HENT normal E scleral and icterus clear Neck Supple JVP flat; carotids brisk and full Clear to ausculation  Regular rate and rhythm, no murmurs gallops or rub Soft with active bowel sounds No clubbing cyanosis none Edema Alert and oriented, grossly normal motor and sensory function Skin Warm and Dry  ECG demonstrates sinus rhythm at 98  Intervals  18/10/36 Axis leftward at -60 Poor R-wave progression  Old IMI Possible AMI  Assessment and   Plan   ischemic cardiomyopathy   Congestive heart failure-chronic-systolic   Orthostatic lightheadedness   Hypotension   Relative tachycardia   Vincent Chandler has persistent left ventricular dysfunction despite guidelines directed medical therapy at maximal tolerated doses. It is appropriate to consider ICD implantation.  We have discussed the relative benefits and merits of transvenous versus subcutaneous ICD implantation.  The advantages of the former including battery longevity, history derived from use in randomized controlled trials and perhaps a somewhat lower rate of inappropriate ICD discharges. Advantages of the latter  include the fact that it is extravascular,  Resulting different implications of device infection and it being non-transvalvular.    Have reviewed the potential benefits and risks of ICD implantation including but not limited to death,  , infection,  device malfunction and inappropriate shocks.  The patient express understanding  and are willing to proceed.  he was mapped and is acceptable for his ICD   We will continue his guideline directed medical therapy. There may be a role for introduction of  Ivabradine

## 2014-10-26 NOTE — Progress Notes (Signed)
Thanks so much for seeing him.

## 2014-11-10 ENCOUNTER — Telehealth: Payer: Self-pay | Admitting: Internal Medicine

## 2014-11-10 NOTE — Telephone Encounter (Signed)
New Message    [employer] needs to know if the Life vest renewal form has been received, it was faxed on Nov 03 2014. Please give them a call back. Thanks.

## 2014-11-11 NOTE — Telephone Encounter (Signed)
Left detailed message explaining that I have not received a fax from [facility] and re-fax another form and I would have Dr. Caryl Comes address. Given device clinic fax number to fax to: [phone]

## 2014-11-12 ENCOUNTER — Encounter: Payer: Self-pay | Admitting: Endocrinology

## 2014-11-12 ENCOUNTER — Ambulatory Visit (INDEPENDENT_AMBULATORY_CARE_PROVIDER_SITE_OTHER): Payer: BLUE CROSS/BLUE SHIELD | Admitting: Endocrinology

## 2014-11-12 ENCOUNTER — Other Ambulatory Visit: Payer: Self-pay | Admitting: *Deleted

## 2014-11-12 VITALS — BP 118/82 | HR 89 | Temp 98.3°F | Resp 16 | Ht 70.0 in | Wt 219.8 lb

## 2014-11-12 DIAGNOSIS — E1165 Type 2 diabetes mellitus with hyperglycemia: Secondary | ICD-10-CM | POA: Diagnosis not present

## 2014-11-12 DIAGNOSIS — IMO0002 Reserved for concepts with insufficient information to code with codable children: Secondary | ICD-10-CM

## 2014-11-12 DIAGNOSIS — E785 Hyperlipidemia, unspecified: Secondary | ICD-10-CM

## 2014-11-12 LAB — LIPID PANEL
Cholesterol: 174 mg/dL (ref 0–200)
HDL: 34.4 mg/dL — AB (ref >39.00)
LDL Cholesterol: 103 mg/dL — ABNORMAL HIGH (ref 0–99)
NONHDL: 139.6
Total CHOL/HDL Ratio: 5
Triglycerides: 181 mg/dL — ABNORMAL HIGH (ref 0.0–149.0)
VLDL: 36.2 mg/dL (ref 0.0–40.0)

## 2014-11-12 LAB — POCT URINALYSIS DIPSTICK
BILIRUBIN UA: NEGATIVE
GLUCOSE UA: NEGATIVE
Ketones, UA: NEGATIVE
Leukocytes, UA: NEGATIVE
Nitrite, UA: NEGATIVE
RBC UA: NEGATIVE
SPEC GRAV UA: 1.015
Urobilinogen, UA: NEGATIVE
pH, UA: 5

## 2014-11-12 LAB — COMPREHENSIVE METABOLIC PANEL
ALBUMIN: 4.1 g/dL (ref 3.5–5.2)
ALK PHOS: 63 U/L (ref 39–117)
ALT: 17 U/L (ref 0–53)
AST: 16 U/L (ref 0–37)
BUN: 30 mg/dL — AB (ref 6–23)
CO2: 27 mEq/L (ref 19–32)
Calcium: 9.7 mg/dL (ref 8.4–10.5)
Chloride: 102 mEq/L (ref 96–112)
Creatinine, Ser: 1.54 mg/dL — ABNORMAL HIGH (ref 0.40–1.50)
GFR: 50.42 mL/min — ABNORMAL LOW (ref >60.00)
Glucose, Bld: 128 mg/dL — ABNORMAL HIGH (ref 70–99)
POTASSIUM: 4.2 meq/L (ref 3.5–5.1)
Sodium: 136 mEq/L (ref 135–145)
Total Bilirubin: 0.6 mg/dL (ref 0.2–1.2)
Total Protein: 7.8 g/dL (ref 6.0–8.3)

## 2014-11-12 LAB — MICROALBUMIN / CREATININE URINE RATIO
Creatinine,U: 164.6 mg/dL
MICROALB UR: 1.8 mg/dL (ref 0.0–1.9)
Microalb Creat Ratio: 1.1 mg/g (ref 0.0–30.0)

## 2014-11-12 LAB — POCT GLUCOSE (DEVICE FOR HOME USE): Glucose Fasting, POC: 169 mg/dL — AB (ref 70–99)

## 2014-11-12 LAB — HEMOGLOBIN A1C: HEMOGLOBIN A1C: 6.1 % (ref 4.6–6.5)

## 2014-11-12 MED ORDER — GLUCOSE BLOOD VI STRP: ORAL_STRIP | Status: DC

## 2014-11-12 MED ORDER — BAYER MICROLET LANCETS MISC: Status: DC

## 2014-11-12 MED ORDER — ATORVASTATIN CALCIUM 20 MG PO TABS: 20.0000 mg | ORAL_TABLET | Freq: Every day | ORAL | Status: DC

## 2014-11-12 NOTE — Progress Notes (Signed)
Patient ID: Vincent Chandler, male   DOB: 06/14/1961, 54 y.o.   MRN: [id number]           Reason for Appointment: Consultation for Type 2 Diabetes  Primary physician: Etter Sjogren Referring provider: Truitt Merle   History of Present Illness:          Date of diagnosis of type 2 diabetes mellitus : ?  2006        Past history:  Vincent Chandler is not sure how Vincent Chandler was diagnosed to have diabetes but was not having significantly high readings  Vincent Chandler has been on various medications over the last 10 years; initially was started on metformin. In 2012 Vincent Chandler was tried on Victoza for a year or 2 which helped his control and provided weight loss; not clear why this was stopped In 2013 Vincent Chandler was given Amaryl or glipizide in addition to his metformin  Subsequently Vincent Chandler had been temporarily on basal insulin after Vincent Chandler was admitted to the hospital but not clear how long this was continued Since about 2015 Vincent Chandler has been on Janumet XR along with his Amaryl However her review of his A1c records indicates poor control since at least 2014 with A1c at least 8%  Recent history:   Patient has had poor control of his diabetes with last A1c being 9.9%. However Vincent Chandler has not had a functioning glucose monitor for several months and had not been motivated to check this Also has not followed up with PCP for diabetes Since the beginning of 2016 Vincent Chandler has been trying to improve his dietary compliance with reducing sweets, starchy foods and fried foods  Current problems identified:  Vincent Chandler does not have any specific meal plan, does have some high fat meats at times  Has not been motivated to exercise and does not do any walking even though Vincent Chandler has not had any limitations to doing so  No glucose monitoring at home     Oral hypoglycemic drugs the patient is taking are: Amaryl 2 mg, Janumet XR 100/1000      Side effects from medications have been: None  Compliance with the medical regimen: Fair Hypoglycemia: None    Glucose monitoring:  done 0 times a day          Glucometer: One Touch.      Blood Glucose readings not done  Self-care: The diet that the patient has been following is: tries to limit Carbs, fried foods .     Meal times: Breakfast:  9 AM, Lunch: 12 noon.  Dinner: 8 PM  Typical meal intake: Breakfast is eggs and [place] bacon, usually has baked chicken or fish with other meals with vegetables and salad               Dietician visit, most recent: None               Exercise: None  Weight history: has lost 20 pounds in the last few months, highest weight has been 240   Wt Readings from Last 3 Encounters:  11/12/14 219 lb 12.8 oz (99.701 kg)  10/26/14 222 lb 3.2 oz (100.789 kg)  10/23/14 222 lb (100.699 kg)    Glycemic control:   Lab Results  Component Value Date   HGBA1C 9.9* 07/18/2014   HGBA1C 8.7* 04/09/2013   HGBA1C 8.2* 08/13/2012   Lab Results  Component Value Date   MICROALBUR 3.4* 04/09/2013   LDLCALC 75 07/21/2014   CREATININE 1.46 10/23/2014         Medication  List       This list is accurate as of: 11/12/14 12:44 PM.  Always use your most recent med list.               aspirin EC 81 MG tablet  Take 1 tablet (81 mg total) by mouth daily.     atorvastatin 20 MG tablet  Commonly known as:  LIPITOR  Take 1 tablet (20 mg total) by mouth at bedtime.     BAYER MICROLET LANCETS lancets  Use as instructed to check blood sugar once a day dx code E11.65     budesonide-formoterol 160-4.5 MCG/ACT inhaler  Commonly known as:  SYMBICORT  Inhale 2 puffs into the lungs 2 (two) times daily.     carvedilol 25 MG tablet  Commonly known as:  COREG  Take 25 mg by mouth 2 (two) times daily with a meal.     clopidogrel 75 MG tablet  Commonly known as:  PLAVIX  Take 1 tablet (75 mg total) by mouth daily with breakfast.     furosemide 40 MG tablet  Commonly known as:  LASIX  Take 1 tablet (40 mg total) by mouth daily.     glimepiride 2 MG tablet  Commonly known as:  AMARYL  Take 1 tablet (2 mg total)  by mouth daily before breakfast.     glucose blood test strip  Commonly known as:  BAYER CONTOUR NEXT TEST  Use as instructed to check blood sugar once a day dx code E11.65     GUAIFENESIN 1200 PO  Take 1 tablet by mouth daily.     JANUMET XR [phone] MG Tb24  Generic drug:  SitaGLIPtin-MetFORMIN HCl  take one tablet by mouth at bedtime     losartan 25 MG tablet  Commonly known as:  COZAAR  Take 1 tablet (25 mg total) by mouth daily.     nitroGLYCERIN 0.4 MG SL tablet  Commonly known as:  NITROSTAT  Place 1 tablet (0.4 mg total) under the tongue every 5 (five) minutes as needed for chest pain.     spironolactone 25 MG tablet  Commonly known as:  ALDACTONE  Take 1 tablet (25 mg total) by mouth daily.        Allergies: No Known Allergies  Past Medical History  Diagnosis Date  . Coronary artery disease     drug eluting stent RCA 2005-EF 30%- s/p CABG x 4; 2/4 patent grafts with SVG-PLOM and SVG-RCA system totally occluded. There are collaterals from the LAD system to the RCA and the RCA is totally occluded proximally. There are no collaterals to the LCx territory. Vincent Chandler then underwent successful PCI of the mid left circumflex artery with overlapping Synergy drug-eluting stents  . Cardiomyopathy     alcohol use related  . Asthma   . Hypertension   . Urinary incontinence   . Hyperlipidemia   . History of hypogonadism   . Pneumonia 02/16/2012    "today makes third time in my life"  . OSA on CPAP 02/16/2012    "wear mask sometimes"  . H/O hiatal hernia   . GERD (gastroesophageal reflux disease)   . Migraines 02/16/2012    "used to"  . Anxiety   . Diabetes mellitus type II     Type 2 NIDDM x 7 years  . History of kidney stones 06/2012  . Atrial fibrillation-postoperative 11/28/2012    Past Surgical History  Procedure Laterality Date  . Tonsillectomy  ~ 1970  . Coronary angioplasty  with stent placement  ~ 2003  . Refractive surgery  1990's    bilaterally  . Coronary  artery bypass graft N/A 08/15/2012    Procedure: CORONARY ARTERY BYPASS GRAFTING (CABG);  Surgeon: Ivin Poot, MD;  Location: [place];  Service: Open Heart Surgery;  Laterality: N/A;  Coronary Artery Bypass Grafting Times Four Using Left Internal Mammary Artery and Right Saphenous Leg Vein Harvested Endoscopically  . Left heart catheterization with coronary angiogram N/A 08/08/2012    Procedure: LEFT HEART CATHETERIZATION WITH CORONARY ANGIOGRAM;  Surgeon: Peter M Martinique, MD;  Location: [facility] CATH LAB;  Service: Cardiovascular;  Laterality: N/A;  . Left heart catheterization with coronary/graft angiogram N/A 07/21/2014    Procedure: LEFT HEART CATHETERIZATION WITH Beatrix Fetters;  Surgeon: Larey Dresser, MD;  Location: [facility] CATH LAB;  Service: Cardiovascular;  Laterality: N/A;  . Percutaneous coronary stent intervention (pci-s)  07/21/2014    Procedure: PERCUTANEOUS CORONARY STENT INTERVENTION (PCI-S);  Surgeon: Larey Dresser, MD;  Location: [facility] CATH LAB;  Service: Cardiovascular;;    Family History  Problem Relation Age of Onset  . Hypertension Mother   . Diabetes Mother   . Hypertension Father   . Diabetes Father   . Hyperlipidemia Father     Social History:  reports that Vincent Chandler has never smoked. His smokeless tobacco use includes Chew. Vincent Chandler reports that Vincent Chandler drinks alcohol. Vincent Chandler reports that Vincent Chandler does not use illicit drugs.    Review of Systems    Lipid history: Ran out of Lipitor which she was taking before, not clear why.  Tends to have low HDL recent previous LDL was 75    Lab Results  Component Value Date   CHOL 145 07/21/2014   HDL 35* 07/21/2014   LDLCALC 75 07/21/2014   LDLDIRECT 109.0 04/09/2013   TRIG 173* 07/21/2014   CHOLHDL 4.1 07/21/2014           Constitutional: no recent weight gain/loss, no complaints of unusual fatigue   Eyes: Occasional  history of blurred vision.  Most recent eye exam was 2014  ENT: no nasal congestion, difficulty  swallowing  Cardiovascular: no chest pain or tightness on exertion.  No leg swelling.  Hypertension: Not significant, probably on carvedilol for CHF also  Respiratory: no cough/shortness of breath  Gastrointestinal: no constipation, diarrhea, nausea or abdominal pain  Musculoskeletal: no muscle/joint aches or cramps   Urological:   No frequency of urination or  nocturia  Skin: no rash or infections  Neurological: no headaches.  Has no numbness, burning, pains or tingling in feet    Psychiatric: no symptoms of depression  Endocrine: No unusual fatigue, cold intolerance or history of thyroid disease Vincent Chandler has had some erectile dysfunction   LABS:  Office Visit on 11/12/2014  Component Date Value Ref Range Status  . Glucose Fasting, POC 11/12/2014 169* 70 - 99 mg/dL Final    Physical Examination:  BP 118/82 mmHg  Pulse 89  Temp(Src) 98.3 F (36.8 C)  Resp 16  Ht _0  (1.778 m)  Wt 219 lb 12.8 oz (99.701 kg)  BMI 31.54 kg/m2  SpO2 90%  GENERAL:         Patient has generalized obesity.   HEENT:         Eye exam shows normal external appearance. Fundus exam shows no retinopathy. Oral exam shows normal mucosa .  NECK:         General:  Neck exam shows no lymphadenopathy. Carotids are normal to palpation and  no bruit heard.  Thyroid is not enlarged and no nodules felt.   LUNGS:         Chest is symmetrical. Lungs are clear to auscultation.Marland Kitchen   HEART:         Heart sounds:  S1 and S2 are normal. No murmurs or clicks heard., no S3 or S4.   ABDOMEN:   There is no distention present. Liver and spleen are not palpable. No other mass or tenderness present.  EXTREMITIES:     There is no edema. No skin lesions present.Marland Kitchen  NEUROLOGICAL:   Vibration sense is moderately reduced in toes. Ankle jerks are absent bilaterally.          Diabetic foot exam shows normal monofilament sensation in the toes and plantar surfaces, no skin lesions or ulcers on the feet and normal pedal  pulses MUSCULOSKELETAL:  There is no swelling or deformity of the peripheral joints. Spine is normal to inspection.   SKIN:       No rash or lesions of concern.        ASSESSMENT:  Diabetes type 2, uncontrolled with mild obesity     Vincent Chandler has been on a 3 drug regimen of Januvia, metformin 1000 mg and Amaryl with generally poor control in the last 2 years Vincent Chandler has had minimal diabetes education Currently not motivated to check his blood sugar or exercise However Vincent Chandler has done well with trying to lose weight with dietary changes in the last few weeks Glucose today is 169 and has not been high on the labs in 4/16 However no recent A1c available, most recent level was 9.9 in 3/30  Complications: No clinically evident complications but Vincent Chandler does need to have updated urine microalbumin and eye exam done  HYPERLIPIDEMIA with history of CAD.  Currently not taking his Lipitor and does not have recent lipid panel.  Appears to have had LDL of 75 previously on treatment which is still above his goal  HYPERTENSION: Mild and well controlled, taking low-dose losartan which would be beneficial for prevention of nephropathy also  PLAN:   Check A1c today and decide on need for adjustment of his therapeutic regimen  Start daily walking.  Given him a walking schedule to start walking at least 5 minutes and increase weekly by 2-4 minutes as tolerated up to a target of 30 minutes at least 5 times a week  Consultation with dietitian for meal planning  Discussed using Victoza if his A1c is over 7%.  Vincent Chandler is somewhat familiar with this from previous use but discussed the dosage titration, possible side effects and timing of injection.  If this is started will stop his Januvia  Vincent Chandler was shown how to use a Contour next meter and Vincent Chandler will check the blood sugar at least once a day at various times, discussed timing of glucose monitoring and blood sugar targets.  Vincent Chandler will bring his monitor for download on the next visit  Check  urine microalbumin  Check lipid panel today  Restart Lipitor and adjust dose to keep LDL below 70  Patient Instructions  Check blood sugars on waking up ..  .. times a week Also check blood sugars about 2 hours after a meal and do this after different meals by rotation Recommended blood sugar levels on waking up is 90-130 and about 2 hours after meal is 140-180 Please bring blood sugar monitor to each visit.  Walk daily    Neva Ramaswamy 11/12/2014, 12:44 PM   Note: This office  note was prepared with Estate agent. Any transcriptional errors that result from this process are unintentional.

## 2014-11-12 NOTE — Patient Instructions (Signed)
Check blood sugars on waking up ..  .. times a week Also check blood sugars about 2 hours after a meal and do this after different meals by rotation Recommended blood sugar levels on waking up is 90-130 and about 2 hours after meal is 140-180 Please bring blood sugar monitor to each visit.  Walk daily

## 2014-11-24 ENCOUNTER — Ambulatory Visit: Payer: BLUE CROSS/BLUE SHIELD | Admitting: Nutrition

## 2014-11-25 ENCOUNTER — Telehealth: Payer: Self-pay | Admitting: Internal Medicine

## 2014-11-25 NOTE — Telephone Encounter (Signed)
New message    Patient calling stating he has not heard anything regarding his upcoming defib procedure.

## 2014-11-25 NOTE — Telephone Encounter (Signed)
Informed patient that we were in the process of amending his office visit documentation and resubmitting SICD request for approval through [employer] - as insurance denied.  Informed him that we have added paperwork we are going to send in to see if this helps with approval from insurance company. Advised that I will call him once we have heard after re-submitting pre-authorization. Patient verbalized understanding and agreeable to plan.

## 2014-11-26 ENCOUNTER — Other Ambulatory Visit: Payer: Self-pay | Admitting: Family Medicine

## 2014-12-01 ENCOUNTER — Other Ambulatory Visit: Payer: Self-pay | Admitting: *Deleted

## 2014-12-01 ENCOUNTER — Telehealth: Payer: Self-pay | Admitting: Nurse Practitioner

## 2014-12-01 MED ORDER — CARVEDILOL 25 MG PO TABS: 25.0000 mg | ORAL_TABLET | Freq: Two times a day (BID) | ORAL | Status: DC

## 2014-12-01 NOTE — Telephone Encounter (Signed)
New Message  Carvatolol 25 mg tabs on rx 1/2 tab 2x day- Pt thinks its-  1 tab 2x day. Please call back to clarify rx.

## 2014-12-01 NOTE — Telephone Encounter (Signed)
S/w pt stated Cecille Rubin told pt to take ( 25 mg ) bid of carvedilol per office note.  I'm confused it stated  Both doses.  Will send to Cecille Rubin to advise.

## 2014-12-01 NOTE — Telephone Encounter (Signed)
He should be taking 25 mg twice a day of Coreg.

## 2014-12-01 NOTE — Telephone Encounter (Signed)
S/w pt is aware of dosage of Coreg ( 25 mg ) bid, requested to be sent to [facility] on [address].

## 2014-12-09 ENCOUNTER — Other Ambulatory Visit: Payer: Self-pay | Admitting: Family Medicine

## 2014-12-15 ENCOUNTER — Ambulatory Visit: Payer: BLUE CROSS/BLUE SHIELD | Admitting: Nurse Practitioner

## 2014-12-23 ENCOUNTER — Encounter: Payer: Self-pay | Admitting: Nurse Practitioner

## 2014-12-23 ENCOUNTER — Ambulatory Visit (INDEPENDENT_AMBULATORY_CARE_PROVIDER_SITE_OTHER): Payer: BLUE CROSS/BLUE SHIELD | Admitting: Nurse Practitioner

## 2014-12-23 VITALS — BP 128/80 | HR 101 | Ht 70.0 in | Wt 228.8 lb

## 2014-12-23 DIAGNOSIS — I255 Ischemic cardiomyopathy: Secondary | ICD-10-CM

## 2014-12-23 DIAGNOSIS — I5022 Chronic systolic (congestive) heart failure: Secondary | ICD-10-CM | POA: Diagnosis not present

## 2014-12-23 DIAGNOSIS — I2510 Atherosclerotic heart disease of native coronary artery without angina pectoris: Secondary | ICD-10-CM

## 2014-12-23 LAB — BASIC METABOLIC PANEL
BUN: 24 mg/dL — ABNORMAL HIGH (ref 6–23)
CO2: 29 mEq/L (ref 19–32)
Calcium: 9.9 mg/dL (ref 8.4–10.5)
Chloride: 101 mEq/L (ref 96–112)
Creatinine, Ser: 1.17 mg/dL (ref 0.40–1.50)
GFR: 69.2 mL/min (ref >60.00)
Glucose, Bld: 268 mg/dL — ABNORMAL HIGH (ref 70–99)
Potassium: 3.8 mEq/L (ref 3.5–5.1)
Sodium: 138 mEq/L (ref 135–145)

## 2014-12-23 NOTE — Progress Notes (Signed)
CARDIOLOGY OFFICE NOTE  Date:  12/23/2014    [name] Date of Birth: 1961-01-21 Medical Record #[id number]  PCP:  Garnet Koyanagi, DO  Cardiologist:  Klein/McLean/Katanya Schlie    Chief Complaint  Patient presents with  . Cardiomyopathy    Follow up visit - seen for Dr. Caryl Comes & [name]    History of Present Illness: Hamdi Kley is a 54 y.o. male who presents today for a follow up visit. Seen for Dr. Caryl Comes and Dr. [name]. He has a known history of CAD s/p 4 vessel CABG back in 2014, hypertension, diabetes mellitus, obesity, OSA, hyperlipidemia, post cabg afib, anxiety, kidney stones, on CPAP, asthma, pneumonia x 3 and a history of noncompliance.   He presented mid January of 2016 with acute respiratory distress and VT. He apparently had run out of several of his medications and was not restricting his salt intake. He developed respiratory distress at home and went to his PCP where due to his hypoxia on the pulse ox in 80s he was sent to [facility] ED for evaluation. There he was found to have VT with 45 beats which he was not aware of and thus was transferred to [facility] for further care. He was diuresed. Troponin was elevated at 0.07. Echo revealed that his EF had dropped further to 20-25%. G3DD. Severe diffuse hypokinesis. He underwent left heart cath revealing 2/4 patent grafts with SVG-PLOM and SVG-RCA system totally occluded. There are collaterals from the LAD system to the RCA and the RCA is totally occluded proximally. There are no collaterals to the LCx territory. He then underwent successful PCI of the mid left circumflex artery with overlapping Synergy drug-eluting stents (See cath reports below). He was placed on plavix. P2Y12 showed the plavix was working appropriately. Repeat echo showed no improvement in his EF after maximizing his medicines - has been referred to Dr. Caryl Comes for ICD implant.    He has wished to follow only with me and Dr. Caryl Comes  I have seen him back several times  since his admission from January. He seemed motivated to get back on track with taking care of himself. Life Vest was placed due to his low EF but he was not able to tolerate. He has hired someone to [name] his meals.   I last saw him in April after his repeat echo - EF still down. Referred to Dr. Caryl Comes - waiting on insurance approval for leadless ICD. I have referred him to Endocrine to get better control of his diabetes.   Comes back today. Here alone. He has quit his job. 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 Still waiting on Dr. Caryl Comes to amend his record to get the subcu ICD approved from [facility]. This should be done by the end of this week. Fleeting dizziness. No palpitations. AIC was 6.1 - best it has ever been. Not short of breath. No chest pain.   Past Medical History  Diagnosis Date  . Coronary artery disease     drug eluting stent RCA 2005-EF 30%- s/p CABG x 4; 2/4 patent grafts with SVG-PLOM and SVG-RCA system totally occluded. There are collaterals from the LAD system to the RCA and the RCA is totally occluded proximally. There are no collaterals to the LCx territory. He then underwent successful PCI of the mid left circumflex artery with overlapping Synergy drug-eluting stents  . Cardiomyopathy     alcohol use related  . Asthma   .  Hypertension   . Urinary incontinence   . Hyperlipidemia   . History of hypogonadism   . Pneumonia 02/16/2012    "today makes third time in my life"  . OSA on CPAP 02/16/2012    "wear mask sometimes"  . H/O hiatal hernia   . GERD (gastroesophageal reflux disease)   . Migraines 02/16/2012    "used to"  . Anxiety   . Diabetes mellitus type II     Type 2 NIDDM x 7 years  . History of kidney stones 06/2012  . Atrial fibrillation-postoperative 11/28/2012    Past Surgical History  Procedure Laterality Date  . Tonsillectomy  ~ 1970  . Coronary angioplasty with stent placement  ~ 2003    . Refractive surgery  1990's    bilaterally  . Coronary artery bypass graft N/A 08/15/2012    Procedure: CORONARY ARTERY BYPASS GRAFTING (CABG);  Surgeon: Ivin Poot, MD;  Location: [place];  Service: Open Heart Surgery;  Laterality: N/A;  Coronary Artery Bypass Grafting Times Four Using Left Internal Mammary Artery and Right Saphenous Leg Vein Harvested Endoscopically  . Left heart catheterization with coronary angiogram N/A 08/08/2012    Procedure: LEFT HEART CATHETERIZATION WITH CORONARY ANGIOGRAM;  Surgeon: Peter M Martinique, MD;  Location: [facility] C[facility] LAB;  Service: Cardiovascular;  Laterality: N/A;  . Left heart catheterization with coronary/graft angiogram N/A 07/21/2014    Procedure: LEFT HEART CATHETERIZATION WITH Beatrix Fetters;  Surgeon: Larey Dresser, MD;  Location: [facility] C[facility] LAB;  Service: Cardiovascular;  Laterality: N/A;  . Percutaneous coronary stent intervention (pci-s)  07/21/2014    Procedure: PERCUTANEOUS CORONARY STENT INTERVENTION (PCI-S);  Surgeon: Larey Dresser, MD;  Location: [facility] CATH LAB;  Service: Cardiovascular;;     Medications: Current Outpatient Prescriptions  Medication Sig Dispense Refill  . aspirin EC 81 MG tablet Take 1 tablet (81 mg total) by mouth daily.    Marland Kitchen atorvastatin (LIPITOR) 20 MG tablet Take 1 tablet (20 mg total) by mouth at bedtime. 30 tablet 2  . BAYER MICROLET LANCETS lancets Use as instructed to check blood sugar once a day dx code E11.65 100 each 1  . budesonide-formoterol (SYMBICORT) 160-4.5 MCG/ACT inhaler Inhale 2 puffs into the lungs 2 (two) times daily. (Patient taking differently: Inhale 2 puffs into the lungs as needed. ) 1 Inhaler 3  . carvedilol (COREG) 25 MG tablet Take 1 tablet (25 mg total) by mouth 2 (two) times daily with a meal. 180 tablet 3  . clopidogrel (PLAVIX) 75 MG tablet Take 1 tablet (75 mg total) by mouth daily with breakfast. 90 tablet 3  . furosemide (LASIX) 40 MG tablet Take 1 tablet (40 mg total) by mouth daily.  90 tablet 3  . glimepiride (AMARYL) 2 MG tablet TAKE ONE TABLET BY MOUTH ONE TIME DAILY BEFORE BREAKFAST. 30 tablet 5  . glucose blood (BAYER CONTOUR NEXT TEST) test strip Use as instructed to check blood sugar once a day dx code E11.65 50 each 3  . GUAIFENESIN 1200 PO Take 1 tablet by mouth daily.    Marland Kitchen JANUMET XR [phone] MG TB24 TAKE ONE TABLET BY MOUTH AT BEDTIME 30 tablet 5  . losartan (COZAAR) 25 MG tablet Take 1 tablet (25 mg total) by mouth daily. 30 tablet 11  . nitroGLYCERIN (NITROSTAT) 0.4 MG SL tablet Place 1 tablet (0.4 mg total) under the tongue every 5 (five) minutes as needed for chest pain. 25 tablet 3  . spironolactone (ALDACTONE) 25 MG tablet Take 1 tablet (  25 mg total) by mouth daily. 30 tablet 11   No current facility-administered medications for this visit.    Allergies: No Known Allergies  Social History: The patient  reports that he has never smoked. His smokeless tobacco use includes Chew. He reports that he drinks alcohol. He reports that he does not use illicit drugs.   Family History: The patient's family history includes Diabetes in his father and mother; Hyperlipidemia in his father; Hypertension in his father and mother.   Review of Systems: Please see the history of present illness.   Otherwise, the review of systems is positive for none.   All other systems are reviewed and negative.   Physical Exam: VS:  BP 128/80 mmHg  Pulse 101  Ht 5' 10" (1.778 m)  Wt 228 lb 12.8 oz (103.783 kg)  BMI 32.83 kg/m2  SpO2 95% .  BMI Body mass index is 32.83 kg/(m^2).  Wt Readings from Last 3 Encounters:  12/23/14 228 lb 12.8 oz (103.783 kg)  11/12/14 219 lb 12.8 oz (99.701 kg)  10/26/14 222 lb 3.2 oz (100.789 kg)    General: Pleasant. Well developed, well nourished and in no acute distress.  HEENT: Normal. Neck: Supple, no JVD, carotid bruits, or masses noted.  Cardiac: Regular rate and rhythm. Rate is a little fast. +S3. No edema.  Respiratory:  Lungs are  clear to auscultation bilaterally with normal work of breathing.  GI: Soft and nontender.  MS: No deformity or atrophy. Gait and ROM intact. Skin: Warm and dry. Color is normal.  Neuro:  Strength and sensation are intact and no gross focal deficits noted.  Psych: Alert, appropriate and with normal affect.   LABORATORY DATA:  EKG:  EKG is not ordered today.   Lab Results  Component Value Date   WBC 6.9 08/12/2014   HGB 15.0 08/12/2014   HCT 44.7 08/12/2014   PLT 222.0 08/12/2014   GLUCOSE 128* 11/12/2014   CHOL 174 11/12/2014   TRIG 181.0* 11/12/2014   HDL 34.40* 11/12/2014   LDLDIRECT 109.0 04/09/2013   LDLCALC 103* 11/12/2014   ALT 17 11/12/2014   AST 16 11/12/2014   NA 136 11/12/2014   K 4.2 11/12/2014   CL 102 11/12/2014   CREATININE 1.54* 11/12/2014   BUN 30* 11/12/2014   CO2 27 11/12/2014   TSH 2.025 08/17/2012   PSA 0.24 03/10/2010   INR 1.02 07/21/2014   HGBA1C 6.1 11/12/2014   MICROALBUR 1.8 11/12/2014    BNP (last 3 results)  Recent Labs  07/17/14 1545  BNP 422.4*    ProBNP (last 3 results) No results for input(s): PROBNP in the last 8760 hours.   Other Studies Reviewed Today: Cardiac Catheterization Procedure Note  Name: Yoshi Coran MRN: [id number] DOB: 05/04/1961  Procedure: Left Heart Cath, Selective Coronary Angiography, LIMA angiography, SVG angiography.   Coronary angiography:  Left mainstem: Minimal disease.   Left anterior descending (LAD): 80% mid LAD at D2. Small-moderate D2 with 80% ostial stenosis. Large D1 with long 80% proximal stenosis. Patent LIMA-LAD. Patent SVG-large D1.   Left circumflex (LCx): 80% proximal stenosis proximal to the take-off of a large PLOM. There is a long up to 99% stenosed area in the proximal PLOM. Total occlusion of SVG-PLOM.   Right coronary artery (RCA): Total occlusion of the proximal RCA. There are left to right collaterals that appear to primarily come from the LAD system.  Total occlusion of SVG-RCA system.   Left ventriculography: Not done, elevated LVEDP and recent echo.     Final Conclusions: 2/4 patent grafts with SVG-PLOM and SVG-RCA system totally occluded. There are collaterals from the LAD system to the RCA and the RCA is totally occluded proximally. There are no collaterals to the LCx territory. Given significant fall in LV systolic function, will plan PCI to LCx and PLOM. Dr Varanasi to perform.   Recommendations: After PCI, will need repeat echo in 3 months to assess for ICD.   With elevated LV EDP, will restart IV Lasix.   [name] McLean 07/21/2014, 12:28 PM  PROCEDURE: PCI Left circumflex  INDICATIONS: Acute systolic heart failure, elevated troponin, bypass graft closure, NSTEMI  The risks, benefits, and details of the procedure were explained to the patient. The patient verbalized understanding and wanted to proceed. Informed written consent was obtained.  PROCEDURE TECHNIQUE: The diagnostic catheterization was performed by Dr. McLean. This revealed severe disease in the native circumflex, up to 99% in the mid circumflex. There was a significant lesion more proximal as well. The segment with the 99% stenosis was a long segment of diffuse disease. Angiomax was used for anticoagulation. ACT was used to check that the Angiomax was therapeutic. A fielder XT wire was placed across the area of severe disease in the mid circumflex. A 2.0 x 20 balloon was used to predilate. A 2.25 x 38 Synergy stent was then deployed across the more distal area disease. A 2.5 x 16 Synergy stent was deployed in overlapping fashion across the more proximal area disease. The distal stent was postdilated with a 2.5 x 12 noncompliant balloon. The more proximal area was postdilated with a 3.0 x 12 noncompliant balloon to high pressure. Several doses of nitroglycerin were administered intracoronary. The patient tolerated the procedure well. There was an excellent  angiographic appearance. A vascade closure device was deployed for hemostasis.   CONTRAST: Total of 95 cc.  COMPLICATIONS: None.    IMPRESSIONS:  1. Successful PCI of the mid left circumflex artery with overlapping Synergy drug-eluting stents as noted above.   RECOMMENDATION: Continue dual antiplatelet therapy for at least a year. He will be watched overnight. We'll continue Angiomax since he only got the Plavix while he was on the cath table. Watch for any groin bleeding. Continue diuresis per Dr. McLean.    Echo Study Conclusions 10/20/2014 - Left ventricle: The cavity size was mildly dilated. Wall thickness was increased in a pattern of mild LVH. Systolic function was severely reduced. The estimated ejection fraction was in the range of 25% to 30%. Diffuse hypokinesis. There is akinesis of the inferolateral and inferior myocardium. Features are consistent with a pseudonormal left ventricular filling pattern, with concomitant abnormal relaxation and increased filling pressure (grade 2 diastolic dysfunction). - Mitral valve: There was mild regurgitation. - Left atrium: The atrium was mildly dilated. - Right ventricle: Systolic function was mildly reduced.  Impressions:  - Technically difficult; definity used; global hypokinesis with inferior and inferior lateral akinesis; overall severely reduced LV function; grade 2 diastolic dysfunction; mild LAE; mildly reduced RV function; mild MR; trace TR.   Assessment/Plan:  1. Systolic heart failure - EF has fallen back to 20% - it remains in this range based on recent echo. He has been referred back to Dr. Klein and we are trying to get approval for the subcu ICD - may need to just proceed with conventional ICD implant.  2. Ischemic CM - EF remains low. Looks compensated. Doing well clinically. He is only taking 1/2 dose Coreg - I have increased to 25 mg BID. Consider   Entresto on return.  3. CAD - with  failure of 2 grafts - s/p PCI to the LCX - on DAPT - No recurrent chest pain. Plavix is refilled today.   4. VT - noted during last hospitalization. Not able to wear the Life Vest - referred back to EP since EF has not recovered.   5. Noncompliance - He still seems motivated to comply.   6. OSA - was to be set up with pulmonary sleep medicine for follow up.   7. Diabetes - referred to Dr. Dwyane Dee - AIC down to 6.1 - best it has ever been for him.   Current medicines are reviewed with the patient today.  The patient does not have concerns regarding medicines other than what has been noted above.  The following changes have been made:  See above.  Labs/ tests ordered today include:    Orders Placed This Encounter  Procedures  . Basic metabolic panel     Disposition:   FU with me in 6 weeks.   Patient is agreeable to this plan and will call if any problems develop in the interim.   Signed: Burtis Junes, RN, ANP-C 12/23/2014 10:40 AM  [facility] [address] [place], [place]  [address] Phone: [phone] Fax: [phone]

## 2014-12-23 NOTE — Patient Instructions (Addendum)
We will be checking the following labs today - BMET   Medication Instructions:    Continue with your current medicines but  Increase the Coreg to a full tablet twice a day     Testing/Procedures To Be Arranged:  N/A  Follow-Up:   I will see you back in 6 weeks    Other Special Instructions:   N/A  Call the [place] office at [phone] if you have any questions, problems or concerns.

## 2014-12-24 ENCOUNTER — Encounter: Payer: Self-pay | Admitting: Internal Medicine

## 2015-01-01 ENCOUNTER — Telehealth: Payer: Self-pay | Admitting: Physician Assistant

## 2015-01-01 NOTE — Telephone Encounter (Signed)
Received after-hours page to the answering service from [place] from [facility] regarding [name] Page said "APPEAL SUBMITTED BY DR. Belle Rive DENIED."  I tried to call Ms. [name] back but kept getting her voicemail. I left a message explaining that she had reached the after-hours service and requested she submit this information to our office during business hours, and asked her to please call back if there is anything I can help with this evening. Will also forward to Dr. Caryl Comes and his nurse. Dayna Dunn PA-C

## 2015-01-06 ENCOUNTER — Telehealth: Payer: Self-pay | Admitting: *Deleted

## 2015-01-06 ENCOUNTER — Encounter: Payer: Self-pay | Admitting: *Deleted

## 2015-01-06 DIAGNOSIS — Z01812 Encounter for preprocedural laboratory examination: Secondary | ICD-10-CM

## 2015-01-06 DIAGNOSIS — I5022 Chronic systolic (congestive) heart failure: Secondary | ICD-10-CM

## 2015-01-06 NOTE — Telephone Encounter (Signed)
Arranged ICD implant for 01/15/15. Pre procedure labs 7/11. Wound check 7/27. Letter of instructions reviewed with patient and left at front desk for him to pick up Monday when he comes by office for lab work. Patient verbalized understanding and agreeable to plan.

## 2015-01-08 NOTE — Telephone Encounter (Signed)
Left detailed message on personal voicemail advising patient of procedure for 7/15 rescheduled for 3:30 p.m. Advised to arrive at hospital at 1:30 p.m.

## 2015-01-11 ENCOUNTER — Other Ambulatory Visit: Payer: BLUE CROSS/BLUE SHIELD

## 2015-01-11 NOTE — Telephone Encounter (Signed)
Confirmed patient received message from last week about when to arrive at hospital for procedure. He confirmed 1:30 p.m.

## 2015-01-12 ENCOUNTER — Other Ambulatory Visit (INDEPENDENT_AMBULATORY_CARE_PROVIDER_SITE_OTHER): Payer: BLUE CROSS/BLUE SHIELD | Admitting: *Deleted

## 2015-01-12 DIAGNOSIS — I5022 Chronic systolic (congestive) heart failure: Secondary | ICD-10-CM

## 2015-01-12 DIAGNOSIS — Z01812 Encounter for preprocedural laboratory examination: Secondary | ICD-10-CM | POA: Diagnosis not present

## 2015-01-12 LAB — CBC WITH DIFFERENTIAL/PLATELET
BASOS ABS: 0 10*3/uL (ref 0.0–0.1)
Basophils Relative: 0.2 % (ref 0.0–3.0)
EOS ABS: 0.3 10*3/uL (ref 0.0–0.7)
Eosinophils Relative: 5.3 % — ABNORMAL HIGH (ref 0.0–5.0)
HEMATOCRIT: 44.4 % (ref 39.0–52.0)
Hemoglobin: 14.9 g/dL (ref 13.0–17.0)
LYMPHS PCT: 23.7 % (ref 12.0–46.0)
Lymphs Abs: 1.4 10*3/uL (ref 0.7–4.0)
MCHC: 33.6 g/dL (ref 30.0–36.0)
MCV: 108.4 fl — ABNORMAL HIGH (ref 78.0–100.0)
Monocytes Absolute: 0.7 10*3/uL (ref 0.1–1.0)
Monocytes Relative: 11 % (ref 3.0–12.0)
NEUTROS ABS: 3.6 10*3/uL (ref 1.4–7.7)
NEUTROS PCT: 59.8 % (ref 43.0–77.0)
Platelets: 215 10*3/uL (ref 150.0–400.0)
RBC: 4.1 Mil/uL — ABNORMAL LOW (ref 4.22–5.81)
RDW: 15.6 % — ABNORMAL HIGH (ref 11.5–15.5)
WBC: 5.9 10*3/uL (ref 4.0–10.5)

## 2015-01-12 LAB — BASIC METABOLIC PANEL
BUN: 18 mg/dL (ref 6–23)
CO2: 25 mEq/L (ref 19–32)
Calcium: 9.2 mg/dL (ref 8.4–10.5)
Chloride: 104 mEq/L (ref 96–112)
Creatinine, Ser: 1.12 mg/dL (ref 0.40–1.50)
GFR: 72.77 mL/min (ref >60.00)
Glucose, Bld: 170 mg/dL — ABNORMAL HIGH (ref 70–99)
Potassium: 4.2 mEq/L (ref 3.5–5.1)
SODIUM: 138 meq/L (ref 135–145)

## 2015-01-14 MED ORDER — SODIUM CHLORIDE 0.9 % IV SOLN
INTRAVENOUS | Status: DC
  Administered 2015-01-15: 14:00:00 via INTRAVENOUS

## 2015-01-14 MED ORDER — MUPIROCIN 2 % EX OINT
TOPICAL_OINTMENT | Freq: Once | CUTANEOUS | Status: AC
  Administered 2015-01-15: 14:00:00 via NASAL
  Filled 2015-01-14: qty 22

## 2015-01-14 MED ORDER — SODIUM CHLORIDE 0.9 % IR SOLN
80.0000 mg | Status: AC
  Administered 2015-01-15: 80 mg
  Filled 2015-01-14: qty 2

## 2015-01-14 MED ORDER — CEFAZOLIN SODIUM-DEXTROSE 2-3 GM-% IV SOLR
2.0000 g | INTRAVENOUS | Status: AC
  Administered 2015-01-15: 2 g via INTRAVENOUS

## 2015-01-15 ENCOUNTER — Encounter (HOSPITAL_COMMUNITY): Payer: Self-pay | Admitting: General Practice

## 2015-01-15 ENCOUNTER — Ambulatory Visit (HOSPITAL_COMMUNITY)
Admission: RE | Admit: 2015-01-15 | Discharge: 2015-01-16 | Disposition: A | Discharge status: Home / Self Care | Payer: BLUE CROSS/BLUE SHIELD | Source: Ambulatory Visit | Attending: Internal Medicine | Admitting: Internal Medicine

## 2015-01-15 ENCOUNTER — Encounter (HOSPITAL_COMMUNITY)
Admission: RE | Disposition: A | Discharge status: Home / Self Care | Payer: Self-pay | Source: Ambulatory Visit | Attending: Internal Medicine

## 2015-01-15 DIAGNOSIS — I255 Ischemic cardiomyopathy: Secondary | ICD-10-CM | POA: Diagnosis not present

## 2015-01-15 DIAGNOSIS — I472 Ventricular tachycardia, unspecified: Secondary | ICD-10-CM

## 2015-01-15 DIAGNOSIS — Z01812 Encounter for preprocedural laboratory examination: Secondary | ICD-10-CM

## 2015-01-15 DIAGNOSIS — E119 Type 2 diabetes mellitus without complications: Secondary | ICD-10-CM | POA: Insufficient documentation

## 2015-01-15 DIAGNOSIS — K219 Gastro-esophageal reflux disease without esophagitis: Secondary | ICD-10-CM | POA: Insufficient documentation

## 2015-01-15 DIAGNOSIS — G4733 Obstructive sleep apnea (adult) (pediatric): Secondary | ICD-10-CM | POA: Insufficient documentation

## 2015-01-15 DIAGNOSIS — I10 Essential (primary) hypertension: Secondary | ICD-10-CM | POA: Insufficient documentation

## 2015-01-15 DIAGNOSIS — I2510 Atherosclerotic heart disease of native coronary artery without angina pectoris: Secondary | ICD-10-CM | POA: Insufficient documentation

## 2015-01-15 DIAGNOSIS — I5022 Chronic systolic (congestive) heart failure: Secondary | ICD-10-CM

## 2015-01-15 DIAGNOSIS — Z959 Presence of cardiac and vascular implant and graft, unspecified: Secondary | ICD-10-CM

## 2015-01-15 HISTORY — DX: Heart failure, unspecified: I50.9

## 2015-01-15 HISTORY — DX: Major depressive disorder, single episode, unspecified: F32.9

## 2015-01-15 HISTORY — DX: Depression, unspecified: F32.A

## 2015-01-15 HISTORY — PX: CARDIAC DEFIBRILLATOR PLACEMENT: SHX171

## 2015-01-15 HISTORY — DX: Other chronic pain: G89.29

## 2015-01-15 HISTORY — DX: Dorsalgia, unspecified: M54.9

## 2015-01-15 HISTORY — PX: EP IMPLANTABLE DEVICE: SHX172B

## 2015-01-15 HISTORY — DX: Angina pectoris, unspecified: I20.9

## 2015-01-15 LAB — GLUCOSE, CAPILLARY
GLUCOSE-CAPILLARY: 160 mg/dL — AB (ref 65–99)
GLUCOSE-CAPILLARY: 181 mg/dL — AB (ref 65–99)
Glucose-Capillary: 118 mg/dL — ABNORMAL HIGH (ref 65–99)

## 2015-01-15 LAB — SURGICAL PCR SCREEN
MRSA, PCR: NEGATIVE
Staphylococcus aureus: POSITIVE — AB

## 2015-01-15 SURGERY — ICD IMPLANT

## 2015-01-15 MED ORDER — LOSARTAN POTASSIUM 50 MG PO TABS
25.0000 mg | ORAL_TABLET | Freq: Every day | ORAL | Status: DC
  Administered 2015-01-15 – 2015-01-16 (×2): 25 mg via ORAL
  Filled 2015-01-15 (×2): qty 1

## 2015-01-15 MED ORDER — LINAGLIPTIN 5 MG PO TABS
5.0000 mg | ORAL_TABLET | Freq: Every day | ORAL | Status: DC
  Administered 2015-01-15 – 2015-01-16 (×2): 5 mg via ORAL
  Filled 2015-01-15 (×2): qty 1

## 2015-01-15 MED ORDER — MUPIROCIN 2 % EX OINT
TOPICAL_OINTMENT | CUTANEOUS | Status: AC
  Filled 2015-01-15: qty 22

## 2015-01-15 MED ORDER — ONDANSETRON HCL 4 MG/2ML IJ SOLN: 4.0000 mg | Freq: Four times a day (QID) | INTRAMUSCULAR | Status: DC | PRN

## 2015-01-15 MED ORDER — LIDOCAINE HCL (PF) 1 % IJ SOLN
INTRAMUSCULAR | Status: AC
  Filled 2015-01-15: qty 30

## 2015-01-15 MED ORDER — CLOPIDOGREL BISULFATE 75 MG PO TABS
75.0000 mg | ORAL_TABLET | Freq: Every day | ORAL | Status: DC
  Administered 2015-01-16: 09:00:00 75 mg via ORAL
  Filled 2015-01-15: qty 1

## 2015-01-15 MED ORDER — FENTANYL CITRATE (PF) 100 MCG/2ML IJ SOLN
INTRAMUSCULAR | Status: DC | PRN
  Administered 2015-01-15: 25 ug via INTRAVENOUS

## 2015-01-15 MED ORDER — YOU HAVE A PACEMAKER BOOK
Freq: Once | Status: AC
  Administered 2015-01-15: 23:00:00
  Filled 2015-01-15: qty 1

## 2015-01-15 MED ORDER — FENTANYL CITRATE (PF) 100 MCG/2ML IJ SOLN
INTRAMUSCULAR | Status: AC
  Filled 2015-01-15: qty 2

## 2015-01-15 MED ORDER — FLUTICASONE PROPIONATE 50 MCG/ACT NA SUSP
1.0000 | Freq: Two times a day (BID) | NASAL | Status: DC | PRN
  Filled 2015-01-15: qty 16

## 2015-01-15 MED ORDER — ACETAMINOPHEN 325 MG PO TABS: 325.0000 mg | ORAL_TABLET | ORAL | Status: DC | PRN

## 2015-01-15 MED ORDER — LIDOCAINE HCL (PF) 1 % IJ SOLN
INTRAMUSCULAR | Status: DC | PRN
  Administered 2015-01-15: 60 mL

## 2015-01-15 MED ORDER — BUDESONIDE-FORMOTEROL FUMARATE 160-4.5 MCG/ACT IN AERO
2.0000 | INHALATION_SPRAY | Freq: Two times a day (BID) | RESPIRATORY_TRACT | Status: DC
  Administered 2015-01-15 – 2015-01-16 (×2): 2 via RESPIRATORY_TRACT
  Filled 2015-01-15: qty 6

## 2015-01-15 MED ORDER — CEFAZOLIN SODIUM-DEXTROSE 2-3 GM-% IV SOLR
INTRAVENOUS | Status: AC
  Filled 2015-01-15: qty 50

## 2015-01-15 MED ORDER — METFORMIN HCL 500 MG PO TABS
1000.0000 mg | ORAL_TABLET | Freq: Every day | ORAL | Status: DC
  Administered 2015-01-16: 1000 mg via ORAL
  Filled 2015-01-15 (×2): qty 2

## 2015-01-15 MED ORDER — FUROSEMIDE 40 MG PO TABS
40.0000 mg | ORAL_TABLET | Freq: Every day | ORAL | Status: DC
  Administered 2015-01-15 – 2015-01-16 (×2): 40 mg via ORAL
  Filled 2015-01-15 (×2): qty 1

## 2015-01-15 MED ORDER — ATORVASTATIN CALCIUM 20 MG PO TABS
20.0000 mg | ORAL_TABLET | Freq: Every day | ORAL | Status: DC
  Administered 2015-01-15: 20 mg via ORAL
  Filled 2015-01-15: qty 1

## 2015-01-15 MED ORDER — CHLORHEXIDINE GLUCONATE 4 % EX LIQD: 60.0000 mL | Freq: Once | CUTANEOUS | Status: DC

## 2015-01-15 MED ORDER — IBUPROFEN 200 MG PO TABS: 400.0000 mg | ORAL_TABLET | Freq: Every day | ORAL | Status: DC | PRN

## 2015-01-15 MED ORDER — CEFAZOLIN SODIUM 1-5 GM-% IV SOLN
1.0000 g | Freq: Four times a day (QID) | INTRAVENOUS | Status: AC
  Administered 2015-01-15 – 2015-01-16 (×3): 1 g via INTRAVENOUS
  Filled 2015-01-15 (×3): qty 50

## 2015-01-15 MED ORDER — NITROGLYCERIN 0.4 MG SL SUBL: 0.4000 mg | SUBLINGUAL_TABLET | SUBLINGUAL | Status: DC | PRN

## 2015-01-15 MED ORDER — SPIRONOLACTONE 25 MG PO TABS
25.0000 mg | ORAL_TABLET | Freq: Every day | ORAL | Status: DC
  Administered 2015-01-15: 25 mg via ORAL
  Filled 2015-01-15 (×2): qty 1

## 2015-01-15 MED ORDER — HEPARIN (PORCINE) IN NACL 2-0.9 UNIT/ML-% IJ SOLN
INTRAMUSCULAR | Status: DC | PRN
  Administered 2015-01-15: 500 mL

## 2015-01-15 MED ORDER — SITAGLIP PHOS-METFORMIN HCL ER 100-1000 MG PO TB24: 1.0000 | ORAL_TABLET | Freq: Every day | ORAL | Status: DC

## 2015-01-15 MED ORDER — CARVEDILOL 12.5 MG PO TABS
25.0000 mg | ORAL_TABLET | Freq: Two times a day (BID) | ORAL | Status: DC
  Administered 2015-01-15 – 2015-01-16 (×2): 25 mg via ORAL
  Filled 2015-01-15 (×2): qty 2

## 2015-01-15 MED ORDER — MIDAZOLAM HCL 5 MG/5ML IJ SOLN
INTRAMUSCULAR | Status: AC
  Filled 2015-01-15: qty 5

## 2015-01-15 MED ORDER — DIPHENHYDRAMINE HCL 50 MG/ML IJ SOLN
50.0000 mg | Freq: Once | INTRAMUSCULAR | Status: AC
  Administered 2015-01-15: 50 mg via INTRAVENOUS
  Filled 2015-01-15: qty 1

## 2015-01-15 MED ORDER — GLIMEPIRIDE 2 MG PO TABS
2.0000 mg | ORAL_TABLET | Freq: Every day | ORAL | Status: DC
  Administered 2015-01-15: 2 mg via ORAL
  Filled 2015-01-15 (×2): qty 1

## 2015-01-15 MED ORDER — GUAIFENESIN ER 600 MG PO TB12
600.0000 mg | ORAL_TABLET | Freq: Every day | ORAL | Status: DC
  Administered 2015-01-15 – 2015-01-16 (×2): 600 mg via ORAL
  Filled 2015-01-15 (×3): qty 1

## 2015-01-15 MED ORDER — HEPARIN (PORCINE) IN NACL 2-0.9 UNIT/ML-% IJ SOLN
INTRAMUSCULAR | Status: AC
  Filled 2015-01-15: qty 500

## 2015-01-15 MED ORDER — ASPIRIN EC 81 MG PO TBEC
81.0000 mg | DELAYED_RELEASE_TABLET | Freq: Every day | ORAL | Status: DC
  Administered 2015-01-15 – 2015-01-16 (×2): 81 mg via ORAL
  Filled 2015-01-15 (×2): qty 1

## 2015-01-15 MED ORDER — SODIUM CHLORIDE 0.9 % IR SOLN
Status: AC
  Filled 2015-01-15: qty 2

## 2015-01-15 MED ORDER — HYDROCORTISONE 1 % EX CREA
TOPICAL_CREAM | Freq: Three times a day (TID) | CUTANEOUS | Status: DC
  Administered 2015-01-15 – 2015-01-16 (×3): via TOPICAL
  Filled 2015-01-15: qty 28

## 2015-01-15 MED ORDER — SODIUM CHLORIDE 0.9 % IV SOLN
INTRAVENOUS | Status: AC
  Administered 2015-01-15: 17:00:00 via INTRAVENOUS

## 2015-01-15 MED ORDER — MIDAZOLAM HCL 5 MG/5ML IJ SOLN
INTRAMUSCULAR | Status: DC | PRN
  Administered 2015-01-15: 1 mg via INTRAVENOUS

## 2015-01-15 SURGICAL SUPPLY — 7 items
CABLE SURGICAL S-101-97-12 (CABLE) ×1 IMPLANT
ICD VISIA AF VR DVAB1D4 (ICD Generator) IMPLANT
LEAD RELIANCE G DF4 0293 (Lead) ×1 IMPLANT
PAD DEFIB LIFELINK (PAD) ×1 IMPLANT
SHEATH CLASSIC 9F (SHEATH) ×1 IMPLANT
TRAY PACEMAKER INSERTION (CUSTOM PROCEDURE TRAY) ×1 IMPLANT
VISIA AF VR DVAB1D4 (ICD Generator) ×2 IMPLANT

## 2015-01-15 NOTE — Interval H&P Note (Signed)
History and Physical Interval Note:  01/15/2015 2:01 PM  Vincent Chandler  has presented today for surgery, with the diagnosis of chronic systolic heart failure  The various methods of treatment have been discussed with the patient and family. After consideration of risks, benefits and other options for treatment, the patient has consented to  Procedure(s): Implantable defibrillator as a surgical intervention .  The patient's history has been reviewed, patient examined, no change in status, stable for surgery.  I have reviewed the patient's chart and labs.  Questions were answered to the patient's satisfaction.     Virl Axe

## 2015-01-15 NOTE — Progress Notes (Signed)
Placed patient on CPAP for the night via auto-mode with minimum pressure set at 8cm and maximum pressure set at 20cm

## 2015-01-15 NOTE — H&P (View-Only) (Signed)
CARDIOLOGY OFFICE NOTE  Date:  12/23/2014    Vincent Chandler Date of Birth: 1961-05-22 Medical Record #[id number]  PCP:  Garnet Koyanagi, DO  Cardiologist:  Klein/McLean/Abriella Filkins    Chief Complaint  Patient presents with  . Cardiomyopathy    Follow up visit - seen for Dr. Caryl Comes & [name]    History of Present Illness: Vincent Chandler is a 54 y.o. male who presents today for a follow up visit. Seen for Dr. Caryl Comes and Dr. [name]. He has a known history of CAD s/p 4 vessel CABG back in 2014, hypertension, diabetes mellitus, obesity, OSA, hyperlipidemia, post cabg afib, anxiety, kidney stones, on CPAP, asthma, pneumonia x 3 and a history of noncompliance.   He presented mid January of 2016 with acute respiratory distress and VT. He apparently had run out of several of his medications and was not restricting his salt intake. He developed respiratory distress at home and went to his PCP where due to his hypoxia on the pulse ox in 80s he was sent to [facility] ED for evaluation. There he was found to have VT with 45 beats which he was not aware of and thus was transferred to [facility] for further care. He was diuresed. Troponin was elevated at 0.07. Echo revealed that his EF had dropped further to 20-25%. G3DD. Severe diffuse hypokinesis. He underwent left heart cath revealing 2/4 patent grafts with SVG-PLOM and SVG-RCA system totally occluded. There are collaterals from the LAD system to the RCA and the RCA is totally occluded proximally. There are no collaterals to the LCx territory. He then underwent successful PCI of the mid left circumflex artery with overlapping Synergy drug-eluting stents (See cath reports below). He was placed on plavix. P2Y12 showed the plavix was working appropriately. Repeat echo showed no improvement in his EF after maximizing his medicines - has been referred to Dr. Caryl Comes for ICD implant.    He has wished to follow only with me and Dr. Caryl Comes  I have seen him back several times  since his admission from January. He seemed motivated to get back on track with taking care of himself. Life Vest was placed due to his low EF but he was not able to tolerate. He has hired someone to [name] his meals.   I last saw him in April after his repeat echo - EF still down. Referred to Dr. Caryl Comes - waiting on insurance approval for leadless ICD. I have referred him to Endocrine to get better control of his diabetes.   Comes back today. Here alone. He has quit his job. 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 Still waiting on Dr. Caryl Comes to amend his record to get the subcu ICD approved from [facility] This should be done by the end of this week. Fleeting dizziness. No palpitations. AIC was 6.1 - best it has ever been. Not short of breath. No chest pain.   Past Medical History  Diagnosis Date  . Coronary artery disease     drug eluting stent RCA 2005-EF 30%- s/p CABG x 4; 2/4 patent grafts with SVG-PLOM and SVG-RCA system totally occluded. There are collaterals from the LAD system to the RCA and the RCA is totally occluded proximally. There are no collaterals to the LCx territory. He then underwent successful PCI of the mid left circumflex artery with overlapping Synergy drug-eluting stents  . Cardiomyopathy     alcohol use related  . Asthma   .  Hypertension   . Urinary incontinence   . Hyperlipidemia   . History of hypogonadism   . Pneumonia 02/16/2012    "today makes third time in my life"  . OSA on CPAP 02/16/2012    "wear mask sometimes"  . H/O hiatal hernia   . GERD (gastroesophageal reflux disease)   . Migraines 02/16/2012    "used to"  . Anxiety   . Diabetes mellitus type II     Type 2 NIDDM x 7 years  . History of kidney stones 06/2012  . Atrial fibrillation-postoperative 11/28/2012    Past Surgical History  Procedure Laterality Date  . Tonsillectomy  ~ 1970  . Coronary angioplasty with stent placement  ~ 2003    . Refractive surgery  1990's    bilaterally  . Coronary artery bypass graft N/A 08/15/2012    Procedure: CORONARY ARTERY BYPASS GRAFTING (CABG);  Surgeon: Ivin Poot, MD;  Location: [place];  Service: Open Heart Surgery;  Laterality: N/A;  Coronary Artery Bypass Grafting Times Four Using Left Internal Mammary Artery and Right Saphenous Leg Vein Harvested Endoscopically  . Left heart catheterization with coronary angiogram N/A 08/08/2012    Procedure: LEFT HEART CATHETERIZATION WITH CORONARY ANGIOGRAM;  Surgeon: Peter M Martinique, MD;  Location: [facility] C[facility] LAB;  Service: Cardiovascular;  Laterality: N/A;  . Left heart catheterization with coronary/graft angiogram N/A 07/21/2014    Procedure: LEFT HEART CATHETERIZATION WITH Beatrix Fetters;  Surgeon: Larey Dresser, MD;  Location: [facility] CATH LAB;  Service: Cardiovascular;  Laterality: N/A;  . Percutaneous coronary stent intervention (pci-s)  07/21/2014    Procedure: PERCUTANEOUS CORONARY STENT INTERVENTION (PCI-S);  Surgeon: Larey Dresser, MD;  Location: [facility] CATH LAB;  Service: Cardiovascular;;     Medications: Current Outpatient Prescriptions  Medication Sig Dispense Refill  . aspirin EC 81 MG tablet Take 1 tablet (81 mg total) by mouth daily.    Marland Kitchen atorvastatin (LIPITOR) 20 MG tablet Take 1 tablet (20 mg total) by mouth at bedtime. 30 tablet 2  . BAYER MICROLET LANCETS lancets Use as instructed to check blood sugar once a day dx code E11.65 100 each 1  . budesonide-formoterol (SYMBICORT) 160-4.5 MCG/ACT inhaler Inhale 2 puffs into the lungs 2 (two) times daily. (Patient taking differently: Inhale 2 puffs into the lungs as needed. ) 1 Inhaler 3  . carvedilol (COREG) 25 MG tablet Take 1 tablet (25 mg total) by mouth 2 (two) times daily with a meal. 180 tablet 3  . clopidogrel (PLAVIX) 75 MG tablet Take 1 tablet (75 mg total) by mouth daily with breakfast. 90 tablet 3  . furosemide (LASIX) 40 MG tablet Take 1 tablet (40 mg total) by mouth daily.  90 tablet 3  . glimepiride (AMARYL) 2 MG tablet TAKE ONE TABLET BY MOUTH ONE TIME DAILY BEFORE BREAKFAST. 30 tablet 5  . glucose blood (BAYER CONTOUR NEXT TEST) test strip Use as instructed to check blood sugar once a day dx code E11.65 50 each 3  . GUAIFENESIN 1200 PO Take 1 tablet by mouth daily.    Marland Kitchen JANUMET XR [phone] MG TB24 TAKE ONE TABLET BY MOUTH AT BEDTIME 30 tablet 5  . losartan (COZAAR) 25 MG tablet Take 1 tablet (25 mg total) by mouth daily. 30 tablet 11  . nitroGLYCERIN (NITROSTAT) 0.4 MG SL tablet Place 1 tablet (0.4 mg total) under the tongue every 5 (five) minutes as needed for chest pain. 25 tablet 3  . spironolactone (ALDACTONE) 25 MG tablet Take 1 tablet (  25 mg total) by mouth daily. 30 tablet 11   No current facility-administered medications for this visit.    Allergies: No Known Allergies  Social History: The patient  reports that he has never smoked. His smokeless tobacco use includes Chew. He reports that he drinks alcohol. He reports that he does not use illicit drugs.   Family History: The patient's family history includes Diabetes in his father and mother; Hyperlipidemia in his father; Hypertension in his father and mother.   Review of Systems: Please see the history of present illness.   Otherwise, the review of systems is positive for none.   All other systems are reviewed and negative.   Physical Exam: VS:  BP 128/80 mmHg  Pulse 101  Ht _0  (1.778 m)  Wt 228 lb 12.8 oz (103.783 kg)  BMI 32.83 kg/m2  SpO2 95% .  BMI Body mass index is 32.83 kg/(m^2).  Wt Readings from Last 3 Encounters:  12/23/14 228 lb 12.8 oz (103.783 kg)  11/12/14 219 lb 12.8 oz (99.701 kg)  10/26/14 222 lb 3.2 oz (100.789 kg)    General: Pleasant. Well developed, well nourished and in no acute distress.  HEENT: Normal. Neck: Supple, no JVD, carotid bruits, or masses noted.  Cardiac: Regular rate and rhythm. Rate is a little fast. +S3. No edema.  Respiratory:  Lungs are  clear to auscultation bilaterally with normal work of breathing.  GI: Soft and nontender.  MS: No deformity or atrophy. Gait and ROM intact. Skin: Warm and dry. Color is normal.  Neuro:  Strength and sensation are intact and no gross focal deficits noted.  Psych: Alert, appropriate and with normal affect.   LABORATORY DATA:  EKG:  EKG is not ordered today.   Lab Results  Component Value Date   WBC 6.9 08/12/2014   HGB 15.0 08/12/2014   HCT 44.7 08/12/2014   PLT 222.0 08/12/2014   GLUCOSE 128* 11/12/2014   CHOL 174 11/12/2014   TRIG 181.0* 11/12/2014   HDL 34.40* 11/12/2014   LDLDIRECT 109.0 04/09/2013   LDLCALC 103* 11/12/2014   ALT 17 11/12/2014   AST 16 11/12/2014   NA 136 11/12/2014   K 4.2 11/12/2014   CL 102 11/12/2014   CREATININE 1.54* 11/12/2014   BUN 30* 11/12/2014   CO2 27 11/12/2014   TSH 2.025 08/17/2012   PSA 0.24 03/10/2010   INR 1.02 07/21/2014   HGBA1C 6.1 11/12/2014   MICROALBUR 1.8 11/12/2014    BNP (last 3 results)  Recent Labs  07/17/14 1545  BNP 422.4*    ProBNP (last 3 results) No results for input(s): PROBNP in the last 8760 hours.   Other Studies Reviewed Today: Cardiac Catheterization Procedure Note  Name: Juma Oxley MRN: [id number] DOB: October 04, 1960  Procedure: Left Heart Cath, Selective Coronary Angiography, LIMA angiography, SVG angiography.   Coronary angiography:  Left mainstem: Minimal disease.   Left anterior descending (LAD): 80% mid LAD at D2. Small-moderate D2 with 80% ostial stenosis. Large D1 with long 80% proximal stenosis. Patent LIMA-LAD. Patent SVG-large D1.   Left circumflex (LCx): 80% proximal stenosis proximal to the take-off of a large PLOM. There is a long up to 99% stenosed area in the proximal PLOM. Total occlusion of SVG-PLOM.   Right coronary artery (RCA): Total occlusion of the proximal RCA. There are left to right collaterals that appear to primarily come from the LAD system.  Total occlusion of SVG-RCA system.   Left ventriculography: Not done, elevated LVEDP and recent echo.  Final Conclusions: 2/4 patent grafts with SVG-PLOM and SVG-RCA system totally occluded. There are collaterals from the LAD system to the RCA and the RCA is totally occluded proximally. There are no collaterals to the LCx territory. Given significant fall in LV systolic function, will plan PCI to LCx and PLOM. Dr Irish Lack to perform.   Recommendations: After PCI, will need repeat echo in 3 months to assess for ICD.   With elevated LV EDP, will restart IV Lasix.   Loralie Champagne 07/21/2014, 12:28 PM  PROCEDURE: PCI Left circumflex  INDICATIONS: Acute systolic heart failure, elevated troponin, bypass graft closure, NSTEMI  The risks, benefits, and details of the procedure were explained to the patient. The patient verbalized understanding and wanted to proceed. Informed written consent was obtained.  PROCEDURE TECHNIQUE: The diagnostic catheterization was performed by Dr. [name]. This revealed severe disease in the native circumflex, up to 99% in the mid circumflex. There was a significant lesion more proximal as well. The segment with the 99% stenosis was a long segment of diffuse disease. Angiomax was used for anticoagulation. ACT was used to check that the Angiomax was therapeutic. A fielder XT wire was placed across the area of severe disease in the mid circumflex. A 2.0 x 20 balloon was used to predilate. A 2.25 x 38 Synergy stent was then deployed across the more distal area disease. A 2.5 x 16 Synergy stent was deployed in overlapping fashion across the more proximal area disease. The distal stent was postdilated with a 2.5 x 12 noncompliant balloon. The more proximal area was postdilated with a 3.0 x 12 noncompliant balloon to high pressure. Several doses of nitroglycerin were administered intracoronary. The patient tolerated the procedure well. There was an excellent  angiographic appearance. A vascade closure device was deployed for hemostasis.   CONTRAST: Total of 95 cc.  COMPLICATIONS: None.    IMPRESSIONS:  1. Successful PCI of the mid left circumflex artery with overlapping Synergy drug-eluting stents as noted above.   RECOMMENDATION: Continue dual antiplatelet therapy for at least a year. He will be watched overnight. We'll continue Angiomax since he only got the Plavix while he was on the cath table. Watch for any groin bleeding. Continue diuresis per Dr. [name].    Echo Study Conclusions 10/20/2014 - Left ventricle: The cavity size was mildly dilated. Wall thickness was increased in a pattern of mild LVH. Systolic function was severely reduced. The estimated ejection fraction was in the range of 25% to 30%. Diffuse hypokinesis. There is akinesis of the inferolateral and inferior myocardium. Features are consistent with a pseudonormal left ventricular filling pattern, with concomitant abnormal relaxation and increased filling pressure (grade 2 diastolic dysfunction). - Mitral valve: There was mild regurgitation. - Left atrium: The atrium was mildly dilated. - Right ventricle: Systolic function was mildly reduced.  Impressions:  - Technically difficult; definity used; global hypokinesis with inferior and inferior lateral akinesis; overall severely reduced LV function; grade 2 diastolic dysfunction; mild LAE; mildly reduced RV function; mild MR; trace TR.   Assessment/Plan:  1. Systolic heart failure - EF has fallen back to 20% - it remains in this range based on recent echo. He has been referred back to Dr. Caryl Comes and we are trying to get approval for the subcu ICD - may need to just proceed with conventional ICD implant.  2. Ischemic CM - EF remains low. Looks compensated. Doing well clinically. He is only taking 1/2 dose Coreg - I have increased to 25 mg BID. Consider  Entresto on return.  3. CAD - with  failure of 2 grafts - s/p PCI to the LCX - on DAPT - No recurrent chest pain. Plavix is refilled today.   4. VT - noted during last hospitalization. Not able to wear the Life Vest - referred back to EP since EF has not recovered.   5. Noncompliance - He still seems motivated to comply.   6. OSA - was to be set up with pulmonary sleep medicine for follow up.   7. Diabetes - referred to Dr. Dwyane Dee - AIC down to 6.1 - best it has ever been for him.   Current medicines are reviewed with the patient today.  The patient does not have concerns regarding medicines other than what has been noted above.  The following changes have been made:  See above.  Labs/ tests ordered today include:    Orders Placed This Encounter  Procedures  . Basic metabolic panel     Disposition:   FU with me in 6 weeks.   Patient is agreeable to this plan and will call if any problems develop in the interim.   Signed: Burtis Junes, RN, ANP-C 12/23/2014 10:40 AM  [facility] [address] [place], [place]  [address] Phone: [phone] Fax: [phone]

## 2015-01-15 NOTE — Progress Notes (Signed)
Added oxygen to patients CPAP due to decrease in Sp02 level

## 2015-01-16 ENCOUNTER — Ambulatory Visit (HOSPITAL_COMMUNITY): Payer: BLUE CROSS/BLUE SHIELD

## 2015-01-16 DIAGNOSIS — I255 Ischemic cardiomyopathy: Secondary | ICD-10-CM

## 2015-01-16 DIAGNOSIS — I2510 Atherosclerotic heart disease of native coronary artery without angina pectoris: Secondary | ICD-10-CM | POA: Diagnosis not present

## 2015-01-16 DIAGNOSIS — G4733 Obstructive sleep apnea (adult) (pediatric): Secondary | ICD-10-CM | POA: Diagnosis not present

## 2015-01-16 DIAGNOSIS — I10 Essential (primary) hypertension: Secondary | ICD-10-CM | POA: Diagnosis not present

## 2015-01-16 LAB — GLUCOSE, CAPILLARY
Glucose-Capillary: 187 mg/dL — ABNORMAL HIGH (ref 65–99)
Glucose-Capillary: 189 mg/dL — ABNORMAL HIGH (ref 65–99)

## 2015-01-16 NOTE — Discharge Summary (Signed)
ELECTROPHYSIOLOGY PROCEDURE DISCHARGE SUMMARY    Patient ID: Vincent Chandler,  MRN: [id number], DOB/AGE: 53-29-1962 53 y.o.  Admit date: 01/15/2015 Discharge date: 01/16/2015  Primary Care Physician: Garnet Koyanagi, DO Primary Cardiologist: Doran Clay Electrophysiologist: Caryl Comes  Primary Discharge Diagnosis:  Ischemic cardiomyopathy status post ICD implantation this admission  Secondary Discharge Diagnosis:  1.  CAD 2.  Hypertension 3.  OSA 4.  GERD 5.  Diabetes  No Known Allergies   Procedures This Admission:  1.  Implantation of a MDT single chamber ICD on 01/15/15 by Dr Caryl Comes.  See op note for full details There were no immediate post procedure complications. 2.  CXR on 01/16/15 demonstrated no pneumothorax status post device implantation.   Brief HPI: Vincent Chandler is a 54 y.o. male was referred to electrophysiology in the outpatient setting for consideration of ICD implantation.  Past medical history includes CAD and ischemic cardiomyopathy.  The patient has persistent LV dysfunction despite guideline directed therapy.  Risks, benefits, and alternatives to ICD implantation were reviewed with the patient who wished to proceed.   Hospital Course:  The patient was admitted and underwent implantation of a MDT single chamber ICD with details as outlined above. He was monitored on telemetry overnight which demonstrated nsr.  Left chest was without hematoma or ecchymosis.  The device was interrogated and found to be functioning normally.  CXR was obtained and demonstrated no pneumothorax status post device implantation.  Wound care, arm mobility, and restrictions were reviewed with the patient.  The patient was examined and considered stable for discharge to home.   The patient's discharge medications include an ARB (Losartan) and beta blocker (Coreg).   Physical Exam: Filed Vitals:   01/15/15 2017 01/15/15 2122 01/15/15 2331 01/16/15 0321  BP: 107/48  117/78 130/79  Pulse: 64   89 97  Temp: 97.8 F (36.6 C)     TempSrc: Oral     Resp: _0 Height:      Weight:    225 lb 15.5 oz (102.5 kg)  SpO2: 94% 99% 98% 98%    GEN- The patient is well appearing, alert and oriented x 3 today.   HEENT: normocephalic, atraumatic; sclera clear, conjunctiva pink; hearing intact; oropharynx clear; neck supple, no JVP Lymph- no cervical lymphadenopathy Lungs- Clear to ausculation bilaterally, normal work of breathing.  No wheezes, rales, rhonchi Heart- Regular rate and rhythm, no murmurs, rubs or gallops, PMI not laterally displaced GI- soft, non-tender, non-distended, bowel sounds present, no hepatosplenomegaly Extremities- no clubbing, cyanosis, or edema; DP/PT/radial pulses 2+ bilaterally MS- no significant deformity or atrophy Skin- warm and dry, no rash or lesion, left chest without hematoma/ecchymosis Psych- euthymic mood, full affect Neuro- strength and sensation are intact   Labs:   Lab Results  Component Value Date   WBC 5.9 01/12/2015   HGB 14.9 01/12/2015   HCT 44.4 01/12/2015   MCV 108.4* 01/12/2015   PLT 215.0 01/12/2015    Recent Labs Lab 01/12/15 1355  NA 138  K 4.2  CL 104  CO2 25  BUN 18  CREATININE 1.12  CALCIUM 9.2  GLUCOSE 170*    Discharge Medications:    Medication List    ASK your doctor about these medications        aspirin EC 81 MG tablet  Take 1 tablet (81 mg total) by mouth daily.     atorvastatin 20 MG tablet  Commonly known as:  LIPITOR  Take 1 tablet (20 mg  total) by mouth at bedtime.     BAYER MICROLET LANCETS lancets  Use as instructed to check blood sugar once a day dx code E11.65     budesonide-formoterol 160-4.5 MCG/ACT inhaler  Commonly known as:  SYMBICORT  Inhale 2 puffs into the lungs 2 (two) times daily.     carvedilol 25 MG tablet  Commonly known as:  COREG  Take 1 tablet (25 mg total) by mouth 2 (two) times daily with a meal.     clopidogrel 75 MG tablet  Commonly known as:  PLAVIX  Take 1  tablet (75 mg total) by mouth daily with breakfast.     fluticasone 50 MCG/ACT nasal spray  Commonly known as:  FLONASE  Place 1 spray into both nostrils 2 (two) times daily as needed for allergies or rhinitis.     furosemide 40 MG tablet  Commonly known as:  LASIX  Take 1 tablet (40 mg total) by mouth daily.     glimepiride 2 MG tablet  Commonly known as:  AMARYL  TAKE ONE TABLET BY MOUTH ONE TIME DAILY BEFORE BREAKFAST.     glucose blood test strip  Commonly known as:  BAYER CONTOUR NEXT TEST  Use as instructed to check blood sugar once a day dx code E11.65     guaiFENesin 600 MG 12 hr tablet  Commonly known as:  MUCINEX  Take 600 mg by mouth daily.     hydrocortisone cream 1 %  Apply 1 application topically 3 (three) times daily as needed for itching.     ibuprofen 200 MG tablet  Commonly known as:  ADVIL,MOTRIN  Take 400 mg by mouth daily as needed for headache.     JANUMET XR [phone] MG Tb24  Generic drug:  SitaGLIPtin-MetFORMIN HCl  TAKE ONE TABLET BY MOUTH AT BEDTIME     losartan 25 MG tablet  Commonly known as:  COZAAR  Take 1 tablet (25 mg total) by mouth daily.     nitroGLYCERIN 0.4 MG SL tablet  Commonly known as:  NITROSTAT  Place 1 tablet (0.4 mg total) under the tongue every 5 (five) minutes as needed for chest pain.     spironolactone 25 MG tablet  Commonly known as:  ALDACTONE  Take 1 tablet (25 mg total) by mouth daily.     VISINE OP  Place 1 drop into both eyes daily.        Disposition:       Follow-up Information    Follow up with [facility] On 01/27/2015.   Why:  at 2:30pm for wound check   Contact information:   [address] [place] [address]       Duration of Discharge Encounter: Greater than 30 minutes including physician time.  Signed, Chanetta Marshall, NP 01/16/2015 6:56 AM  EP Attending  Patient seen and examined. Agree with the findings documented above. The patient is stable for  discharge. Usual followup.  Kiven Vangilder,M.D.

## 2015-01-16 NOTE — Discharge Instructions (Signed)
° ° °  Supplemental Discharge Instructions for  Pacemaker/Defibrillator Patients  Activity No heavy lifting or vigorous activity with your left/right arm for 6 to 8 weeks.  Do not raise your left/right arm above your head for one week.  Gradually raise your affected arm as drawn below.           __      01/20/15                        01/21/15                 01/22/15                   01/23/15  NO DRIVING for 1 week    ; you may begin driving on  9/97/74   .  WOUND CARE - Keep the wound area clean and dry.  Do not get this area wet for one week. No showers for one week; you may shower on   01/23/15  . - The tape/steri-strips on your wound will fall off; do not pull them off.  No bandage is needed on the site.  DO  NOT apply any creams, oils, or ointments to the wound area. - If you notice any drainage or discharge from the wound, any swelling or bruising at the site, or you develop a fever > 101? F after you are discharged home, call the office at once.  Special Instructions - You are still able to use cellular telephones; use the ear opposite the side where you have your pacemaker/defibrillator.  Avoid carrying your cellular phone near your device. - When traveling through airports, show security personnel your identification card to avoid being screened in the metal detectors.  Ask the security personnel to use the hand wand. - Avoid arc welding equipment, MRI testing (magnetic resonance imaging), TENS units (transcutaneous nerve stimulators).  Call the office for questions about other devices. - Avoid electrical appliances that are in poor condition or are not properly grounded. - Microwave ovens are safe to be near or to operate.  Additional information for defibrillator patients should your device go off: - If your device goes off ONCE and you feel fine afterward, notify the device clinic nurses. - If your device goes off ONCE and you do not feel well afterward, call 911. - If your device  goes off TWICE, call 911. - If your device goes off THREE times in one day, call 911.  DO NOT DRIVE YOURSELF OR A FAMILY MEMBER WITH A DEFIBRILLATOR TO THE HOSPITAL--CALL 911.

## 2015-01-18 ENCOUNTER — Encounter (HOSPITAL_COMMUNITY): Payer: Self-pay | Admitting: Internal Medicine

## 2015-01-20 ENCOUNTER — Telehealth: Payer: Self-pay | Admitting: Internal Medicine

## 2015-01-20 DIAGNOSIS — G4733 Obstructive sleep apnea (adult) (pediatric): Secondary | ICD-10-CM

## 2015-01-20 NOTE — Telephone Encounter (Signed)
New message      Pt needs to have a sleep study scheduled so he can get his CPAP.  Pt is available mon-thurs only.  Pt is aware Romelle Starcher is out of office today---OK to wait until she returns

## 2015-01-21 NOTE — Telephone Encounter (Signed)
Spoke with patient to discuss what exactly was needed.  Dr. Caryl Comes is wanting him to have a sleep study done because of his history with severe sleep apnea. I have placed the order for a split night.  Patient was instructed that he will receive a packet in the mail with the date of the study as well as information to fill out and take with him to his appointment. He stated verbal understanding and thanked me for helping him out.

## 2015-01-27 ENCOUNTER — Ambulatory Visit (INDEPENDENT_AMBULATORY_CARE_PROVIDER_SITE_OTHER): Payer: BLUE CROSS/BLUE SHIELD | Admitting: *Deleted

## 2015-01-27 DIAGNOSIS — I255 Ischemic cardiomyopathy: Secondary | ICD-10-CM

## 2015-01-27 LAB — CUP PACEART INCLINIC DEVICE CHECK: Date Time Interrogation Session: 20160727161039

## 2015-01-27 NOTE — Progress Notes (Signed)
Wound check ICD in clinic Site well healed with no redness or swelling Estimated battery longevity 11.4 years No episodes recorded Normal devicefunction No changes made

## 2015-02-02 ENCOUNTER — Encounter: Payer: Self-pay | Admitting: Nurse Practitioner

## 2015-02-02 ENCOUNTER — Ambulatory Visit (INDEPENDENT_AMBULATORY_CARE_PROVIDER_SITE_OTHER): Payer: BLUE CROSS/BLUE SHIELD | Admitting: Nurse Practitioner

## 2015-02-02 VITALS — BP 122/86 | HR 92 | Ht 70.0 in | Wt 231.1 lb

## 2015-02-02 DIAGNOSIS — Z95810 Presence of automatic (implantable) cardiac defibrillator: Secondary | ICD-10-CM

## 2015-02-02 DIAGNOSIS — I255 Ischemic cardiomyopathy: Secondary | ICD-10-CM

## 2015-02-02 LAB — BASIC METABOLIC PANEL
BUN: 21 mg/dL (ref 6–23)
CO2: 31 mEq/L (ref 19–32)
Calcium: 9.8 mg/dL (ref 8.4–10.5)
Chloride: 102 mEq/L (ref 96–112)
Creatinine, Ser: 1.47 mg/dL (ref 0.40–1.50)
GFR: 53.15 mL/min — ABNORMAL LOW (ref >60.00)
Glucose, Bld: 166 mg/dL — ABNORMAL HIGH (ref 70–99)
Potassium: 4.2 mEq/L (ref 3.5–5.1)
Sodium: 139 mEq/L (ref 135–145)

## 2015-02-02 MED ORDER — SACUBITRIL-VALSARTAN 24-26 MG PO TABS: 1.0000 | ORAL_TABLET | Freq: Two times a day (BID) | ORAL | Status: DC

## 2015-02-02 NOTE — Progress Notes (Addendum)
CARDIOLOGY OFFICE NOTE  Date:  02/02/2015    Vincent Chandler Date of Birth: 06/10/1961 Medical Record #[id number]  PCP:  Garnet Koyanagi, DO  Cardiologist:  Allegra Lai    Chief Complaint  Patient presents with  . Cardiomyopathy    Post ICD implant - see for Dr. Caryl Comes    History of Present Illness: Vincent Chandler is a 54 y.o. male who presents today for a follow up visit. Seen for Dr. Caryl Comes and Dr. [name]. He has a known history of CAD s/p 4 vessel CABG back in 2014, hypertension, diabetes mellitus, obesity, OSA, hyperlipidemia, post cabg afib, anxiety, kidney stones, on CPAP, asthma, pneumonia x 3 and a history of noncompliance.   He presented mid January of 2016 with acute respiratory distress and VT. He apparently had run out of several of his medications and was not restricting his salt intake. He developed respiratory distress at home and went to his PCP where due to his hypoxia on the pulse ox in 80s he was sent to [facility] ED for evaluation. There he was found to have VT with 45 beats which he was not aware of and thus was transferred to [facility] for further care. He was diuresed. Troponin was elevated at 0.07. Echo revealed that his EF had dropped further to 20-25%. G3DD. Severe diffuse hypokinesis. He underwent left heart cath revealing 2/4 patent grafts with SVG-PLOM and SVG-RCA system totally occluded. There are collaterals from the LAD system to the RCA and the RCA is totally occluded proximally. There are no collaterals to the LCx territory. He then underwent successful PCI of the mid left circumflex artery with overlapping Synergy drug-eluting stents (See cath reports below). He was placed on plavix. P2Y12 showed the plavix was working appropriately. Repeat echo showed no improvement in his EF after maximizing his medicines - has been referred to Dr. Caryl Comes for ICD implant.   He has wished to follow only with me and Dr. Caryl Comes  I have seen him back several times since his  admission from January. He seemed motivated to get back on track with taking care of himself. Life Vest was placed due to his low EF but he was not able to tolerate. He has hired someone to [name] his meals.   I last saw him in April after his repeat echo - EF still down. Referred to Dr. Caryl Comes. I have referred him to Endocrine to get better control of his diabetes.   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 Has had his ICD placed - no problems noted.  Comes back today. Here alone. Doing pretty good. Little stress with his new business. No chest pain. Not short of breath. Has gained a few pounds. No swelling. Not dizzy. Does not have a BP cuff at home. No shocks - says he has no awareness of his ICD implant.     Past Medical History  Diagnosis Date  . Coronary artery disease     drug eluting stent RCA 2005-EF 30%- s/p CABG x 4; 2/4 patent grafts with SVG-PLOM and SVG-RCA system totally occluded. There are collaterals from the LAD system to the RCA and the RCA is totally occluded proximally. There are no collaterals to the LCx territory. He then underwent successful PCI of the mid left circumflex artery with overlapping Synergy drug-eluting stents  . Cardiomyopathy     alcohol use related  .  Asthma   . Hypertension   . Urinary incontinence   . Hyperlipidemia   . History of hypogonadism   . H/O hiatal hernia   . GERD (gastroesophageal reflux disease)   . Anxiety   . Atrial fibrillation-postoperative 11/28/2012  . Kidney stones   . CHF (congestive heart failure)   . Anginal pain   . Pneumonia 3-4 times  . OSA on CPAP     "mask is broken; working on getting a new one" (01/15/2015)  . Diabetes mellitus type II dx'd in the 1990's  . Migraines     "used to"  . Chronic back pain   . Depression     Past Surgical History  Procedure Laterality Date  . Tonsillectomy  ~ 1970  . Coronary angioplasty  with stent placement  ~ 2003  . Refractive surgery Bilateral 1990's  . Coronary artery bypass graft N/A 08/15/2012    Procedure: CORONARY ARTERY BYPASS GRAFTING (CABG);  Surgeon: Ivin Poot, MD;  Location: [place];  Service: Open Heart Surgery;  Laterality: N/A;  Coronary Artery Bypass Grafting Times Four Using Left Internal Mammary Artery and Right Saphenous Leg Vein Harvested Endoscopically  . Left heart catheterization with coronary angiogram N/A 08/08/2012    Procedure: LEFT HEART CATHETERIZATION WITH CORONARY ANGIOGRAM;  Surgeon: Peter M Martinique, MD;  Location: [facility] CATH LAB;  Service: Cardiovascular;  Laterality: N/A;  . Left heart catheterization with coronary/graft angiogram N/A 07/21/2014    Procedure: LEFT HEART CATHETERIZATION WITH Beatrix Fetters;  Surgeon: Larey Dresser, MD;  Location: [facility] CATH LAB;  Service: Cardiovascular;  Laterality: N/A;  . Percutaneous coronary stent intervention (pci-s)  07/21/2014    Procedure: PERCUTANEOUS CORONARY STENT INTERVENTION (PCI-S);  Surgeon: Larey Dresser, MD;  Location: [facility] CATH LAB;  Service: Cardiovascular;;  . Cardiac defibrillator placement  01/15/2015  . Ep implantable device N/A 01/15/2015    Procedure: ICD Implant;  Surgeon: Deboraha Sprang, MD;  Location: [place] CV LAB;  Service: Cardiovascular;  Laterality: N/A;     Medications: Current Outpatient Prescriptions  Medication Sig Dispense Refill  . aspirin EC 81 MG tablet Take 1 tablet (81 mg total) by mouth daily.    Marland Kitchen atorvastatin (LIPITOR) 20 MG tablet Take 1 tablet (20 mg total) by mouth at bedtime. (Patient taking differently: Take 20 mg by mouth daily. ) 30 tablet 2  . BAYER MICROLET LANCETS lancets Use as instructed to check blood sugar once a day dx code E11.65 100 each 1  . budesonide-formoterol (SYMBICORT) 160-4.5 MCG/ACT inhaler Inhale 2 puffs into the lungs 2 (two) times daily. (Patient taking differently: Inhale 1 puff into the lungs 2 (two) times daily as needed  (wheezing). ) 1 Inhaler 3  . carvedilol (COREG) 25 MG tablet Take 1 tablet (25 mg total) by mouth 2 (two) times daily with a meal. 180 tablet 3  . clopidogrel (PLAVIX) 75 MG tablet Take 1 tablet (75 mg total) by mouth daily with breakfast. (Patient taking differently: Take 75 mg by mouth daily. ) 90 tablet 3  . fluticasone (FLONASE) 50 MCG/ACT nasal spray Place 1 spray into both nostrils 2 (two) times daily as needed for allergies or rhinitis.    . furosemide (LASIX) 40 MG tablet Take 1 tablet (40 mg total) by mouth daily. 90 tablet 3  . glimepiride (AMARYL) 2 MG tablet TAKE ONE TABLET BY MOUTH ONE TIME DAILY BEFORE BREAKFAST. (Patient taking differently: TAKE ONE TABLET BY MOUTH ONE TIME DAILY) 30 tablet 5  . glucose  blood (BAYER CONTOUR NEXT TEST) test strip Use as instructed to check blood sugar once a day dx code E11.65 50 each 3  . guaiFENesin (MUCINEX) 600 MG 12 hr tablet Take 600 mg by mouth daily.    . hydrocortisone cream 1 % Apply 1 application topically 3 (three) times daily as needed for itching.    Marland Kitchen ibuprofen (ADVIL,MOTRIN) 200 MG tablet Take 400 mg by mouth daily as needed for headache.    Marland Kitchen JANUMET XR [phone] MG TB24 TAKE ONE TABLET BY MOUTH AT BEDTIME (Patient taking differently: TAKE ONE TABLET BY MOUTH DAILY) 30 tablet 5  . losartan (COZAAR) 25 MG tablet Take 1 tablet (25 mg total) by mouth daily. 30 tablet 11  . nitroGLYCERIN (NITROSTAT) 0.4 MG SL tablet Place 1 tablet (0.4 mg total) under the tongue every 5 (five) minutes as needed for chest pain. 25 tablet 3  . spironolactone (ALDACTONE) 25 MG tablet Take 1 tablet (25 mg total) by mouth daily. 30 tablet 11  . Tetrahydrozoline HCl (VISINE OP) Place 1 drop into both eyes daily.     No current facility-administered medications for this visit.    Allergies: No Known Allergies  Social History: The patient  reports that he has never smoked. His smokeless tobacco use includes Chew. He reports that he drinks about 3.6 oz of  alcohol per week. He reports that he does not use illicit drugs.   Family History: The patient's family history includes Diabetes in his father and mother; Hyperlipidemia in his father; Hypertension in his father and mother.   Review of Systems: Please see the history of present illness.   Otherwise, the review of systems is positive for none.   All other systems are reviewed and negative.   Physical Exam: VS:  BP 122/86 mmHg  Pulse 92  Ht _0  (1.778 m)  Wt 231 lb 1.9 oz (104.835 kg)  BMI 33.16 kg/m2  SpO2 87% .  BMI Body mass index is 33.16 kg/(m^2).  Wt Readings from Last 3 Encounters:  02/02/15 231 lb 1.9 oz (104.835 kg)  01/16/15 225 lb 15.5 oz (102.5 kg)  12/23/14 228 lb 12.8 oz (103.783 kg)    General: Pleasant. Well developed, well nourished and in no acute distress.  HEENT: Normal. Neck: Supple, no JVD, carotid bruits, or masses noted.  Cardiac: Regular rate and rhythm. No murmurs, rubs, or gallops. No edema.  Respiratory:  Lungs are clear to auscultation bilaterally with normal work of breathing.  GI: Soft and nontender.  MS: No deformity or atrophy. Gait and ROM intact. Skin: Warm and dry. Color is normal.  Neuro:  Strength and sensation are intact and no gross focal deficits noted.  Psych: Alert, appropriate and with normal affect.   LABORATORY DATA:  EKG:  EKG is not ordered today.   Lab Results  Component Value Date   WBC 5.9 01/12/2015   HGB 14.9 01/12/2015   HCT 44.4 01/12/2015   PLT 215.0 01/12/2015   GLUCOSE 170* 01/12/2015   CHOL 174 11/12/2014   TRIG 181.0* 11/12/2014   HDL 34.40* 11/12/2014   LDLDIRECT 109.0 04/09/2013   LDLCALC 103* 11/12/2014   ALT 17 11/12/2014   AST 16 11/12/2014   NA 138 01/12/2015   K 4.2 01/12/2015   CL 104 01/12/2015   CREATININE 1.12 01/12/2015   BUN 18 01/12/2015   CO2 25 01/12/2015   TSH 2.025 08/17/2012   PSA 0.24 03/10/2010   INR 1.02 07/21/2014   HGBA1C 6.1 11/12/2014  MICROALBUR 1.8 11/12/2014     BNP (last 3 results)  Recent Labs  07/17/14 1545  BNP 422.4*    ProBNP (last 3 results) No results for input(s): PROBNP in the last 8760 hours.   Other Studies Reviewed Today:  Cardiac Catheterization Procedure Note  Name: Tia Gelb MRN: [id number] DOB: [birth year]  Procedure: Left Heart Cath, Selective Coronary Angiography, LIMA angiography, SVG angiography.   Coronary angiography:  Left mainstem: Minimal disease.   Left anterior descending (LAD): 80% mid LAD at D2. Small-moderate D2 with 80% ostial stenosis. Large D1 with long 80% proximal stenosis. Patent LIMA-LAD. Patent SVG-large D1.   Left circumflex (LCx): 80% proximal stenosis proximal to the take-off of a large PLOM. There is a long up to 99% stenosed area in the proximal PLOM. Total occlusion of SVG-PLOM.   Right coronary artery (RCA): Total occlusion of the proximal RCA. There are left to right collaterals that appear to primarily come from the LAD system. Total occlusion of SVG-RCA system.   Left ventriculography: Not done, elevated LVEDP and recent echo.   Final Conclusions: 2/4 patent grafts with SVG-PLOM and SVG-RCA system totally occluded. There are collaterals from the LAD system to the RCA and the RCA is totally occluded proximally. There are no collaterals to the LCx territory. Given significant fall in LV systolic function, will plan PCI to LCx and PLOM. Dr Irish Lack to perform.   Recommendations: After PCI, will need repeat echo in 3 months to assess for ICD.   With elevated LV EDP, will restart IV Lasix.   Loralie Champagne 07/21/2014, 12:28 PM  PROCEDURE: PCI Left circumflex  INDICATIONS: Acute systolic heart failure, elevated troponin, bypass graft closure, NSTEMI  The risks, benefits, and details of the procedure were explained to the patient. The patient verbalized understanding and wanted to proceed. Informed written consent was obtained.  PROCEDURE  TECHNIQUE: The diagnostic catheterization was performed by Dr. [name]. This revealed severe disease in the native circumflex, up to 99% in the mid circumflex. There was a significant lesion more proximal as well. The segment with the 99% stenosis was a long segment of diffuse disease. Angiomax was used for anticoagulation. ACT was used to check that the Angiomax was therapeutic. A fielder XT wire was placed across the area of severe disease in the mid circumflex. A 2.0 x 20 balloon was used to predilate. A 2.25 x 38 Synergy stent was then deployed across the more distal area disease. A 2.5 x 16 Synergy stent was deployed in overlapping fashion across the more proximal area disease. The distal stent was postdilated with a 2.5 x 12 noncompliant balloon. The more proximal area was postdilated with a 3.0 x 12 noncompliant balloon to high pressure. Several doses of nitroglycerin were administered intracoronary. The patient tolerated the procedure well. There was an excellent angiographic appearance. A vascade closure device was deployed for hemostasis.   CONTRAST: Total of 95 cc.  COMPLICATIONS: None.    IMPRESSIONS:  1. Successful PCI of the mid left circumflex artery with overlapping Synergy drug-eluting stents as noted above.   RECOMMENDATION: Continue dual antiplatelet therapy for at least a year. He will be watched overnight. We'll continue Angiomax since he only got the Plavix while he was on the cath table. Watch for any groin bleeding. Continue diuresis per Dr. [name].     Echo Study Conclusions 10/20/2014 - Left ventricle: The cavity size was mildly dilated. Wall thickness was increased in a pattern of mild LVH. Systolic function was severely reduced. The  estimated ejection fraction was in the range of 25% to 30%. Diffuse hypokinesis. There is akinesis of the inferolateral and inferior myocardium. Features are consistent with a pseudonormal left ventricular  filling pattern, with concomitant abnormal relaxation and increased filling pressure (grade 2 diastolic dysfunction). - Mitral valve: There was mild regurgitation. - Left atrium: The atrium was mildly dilated. - Right ventricle: Systolic function was mildly reduced.  Impressions:  - Technically difficult; definity used; global hypokinesis with inferior and inferior lateral akinesis; overall severely reduced LV function; grade 2 diastolic dysfunction; mild LAE; mildly reduced RV function; mild MR; trace TR.     Assessment/Plan:  1. Systolic heart failure - EF has fallen back to 20% - it remains in this range based on recent echo. Now s/p ICD implant. NYHA I/II.   2. Ischemic CM - EF remains low. Looks compensated. Doing well clinically. On a good CHF regimen. He has room with his BP. Will stop the Losartan and start Entresto 24/26 BID. Cautioned about dizziness. Check lab today and repeat in one week.  3. CAD - with failure of 2 grafts - s/p PCI to the LCX - on DAPT - No recurrent chest pain. Plavix is refilled today.   4. VT - noted during last hospitalization. Now with ICD in place  5. Noncompliance - He still seems motivated to comply.   6. OSA - was to be set up with pulmonary sleep medicine for follow up.   7. Diabetes - referred to Dr. Dwyane Dee - AIC down to 6.1 - best it has ever been for him.   Current medicines are reviewed with the patient today.  The patient does not have concerns regarding medicines other than what has been noted above.  The following changes have been made:  See above.  Labs/ tests ordered today include:   No orders of the defined types were placed in this encounter.     Disposition:   FU with me in 4 weeks.   Patient is agreeable to this plan and will call if any problems develop in the interim.   Signed: Burtis Junes, RN, ANP-C 02/02/2015 11:40 AM  [facility] [address] [place], [place]  [address] Phone: [phone] Fax: [phone]

## 2015-02-02 NOTE — Patient Instructions (Addendum)
We will be checking the following labs today - BMET   Lab in one week - BMET   Medication Instructions:    Continue with your current medicines but  Stop the Losartan  Start Entresto 24/26 take this twice a day - can start tomorrow - use the samples  Testing/Procedures To Be Arranged:  N/A  Follow-Up:   See me in one month.     Other Special Instructions:   Call for any dizzy spells  Call the [place] office at [phone] if you have any questions, problems or concerns.

## 2015-02-09 ENCOUNTER — Other Ambulatory Visit: Payer: BLUE CROSS/BLUE SHIELD

## 2015-02-09 ENCOUNTER — Other Ambulatory Visit: Payer: Self-pay | Admitting: Endocrinology

## 2015-02-11 ENCOUNTER — Other Ambulatory Visit: Payer: BLUE CROSS/BLUE SHIELD

## 2015-03-10 ENCOUNTER — Encounter: Payer: Self-pay | Admitting: Internal Medicine

## 2015-03-12 ENCOUNTER — Ambulatory Visit: Payer: BLUE CROSS/BLUE SHIELD | Admitting: Nurse Practitioner

## 2015-03-15 ENCOUNTER — Ambulatory Visit: Payer: BLUE CROSS/BLUE SHIELD | Admitting: Endocrinology

## 2015-03-17 ENCOUNTER — Telehealth: Payer: Self-pay | Admitting: Nurse Practitioner

## 2015-03-17 NOTE — Telephone Encounter (Signed)
New message       Calling to get prior authorization on entresto 24 x 26

## 2015-03-19 ENCOUNTER — Telehealth: Payer: Self-pay

## 2015-03-19 NOTE — Telephone Encounter (Signed)
PA in progress. Waiting on [employer].

## 2015-03-19 NOTE — Telephone Encounter (Signed)
Prior auth for Vincent Chandler 24-26 sent to [facility] via Cover My Meds.

## 2015-03-23 ENCOUNTER — Telehealth: Payer: Self-pay

## 2015-03-23 ENCOUNTER — Encounter: Payer: Self-pay | Admitting: Internal Medicine

## 2015-03-23 NOTE — Telephone Encounter (Signed)
Entresto approved through [employer] for one year.

## 2015-04-11 ENCOUNTER — Ambulatory Visit (HOSPITAL_BASED_OUTPATIENT_CLINIC_OR_DEPARTMENT_OTHER): Payer: BLUE CROSS/BLUE SHIELD | Attending: Internal Medicine

## 2015-04-29 ENCOUNTER — Encounter: Payer: BLUE CROSS/BLUE SHIELD | Admitting: Internal Medicine

## 2015-05-15 ENCOUNTER — Other Ambulatory Visit: Payer: Self-pay | Admitting: Physician Assistant

## 2015-05-18 ENCOUNTER — Other Ambulatory Visit: Payer: Self-pay | Admitting: Endocrinology

## 2015-06-02 ENCOUNTER — Ambulatory Visit (INDEPENDENT_AMBULATORY_CARE_PROVIDER_SITE_OTHER): Payer: BLUE CROSS/BLUE SHIELD | Admitting: *Deleted

## 2015-06-02 DIAGNOSIS — I255 Ischemic cardiomyopathy: Secondary | ICD-10-CM | POA: Diagnosis not present

## 2015-06-04 NOTE — Progress Notes (Signed)
Remote ICD transmission.   

## 2015-06-10 LAB — CUP PACEART REMOTE DEVICE CHECK
Date Time Interrogation Session: 20161208165914
Implantable Lead Location: 753862
Implantable Lead Model: 293
Implantable Lead Serial Number: 382379
Lead Channel Setting Pacing Amplitude: 3.5 V
Lead Channel Setting Pacing Pulse Width: 0.4 ms
MDC IDC LEAD IMPLANT DT: 20160715
MDC IDC SET LEADCHNL RV SENSING SENSITIVITY: 0.3 mV

## 2015-06-15 ENCOUNTER — Encounter: Payer: Self-pay | Admitting: Cardiology

## 2015-06-17 ENCOUNTER — Telehealth: Payer: Self-pay | Admitting: Family Medicine

## 2015-06-17 ENCOUNTER — Other Ambulatory Visit: Payer: Self-pay | Admitting: Family Medicine

## 2015-06-17 NOTE — Telephone Encounter (Signed)
Left message for patient to call about flu shot

## 2015-06-19 ENCOUNTER — Other Ambulatory Visit: Payer: Self-pay | Admitting: Endocrinology

## 2015-06-20 ENCOUNTER — Other Ambulatory Visit: Payer: Self-pay | Admitting: Family Medicine

## 2015-06-21 NOTE — Telephone Encounter (Signed)
Patient see's Endo for Diabetes.      KP

## 2015-06-22 ENCOUNTER — Other Ambulatory Visit: Payer: Self-pay | Admitting: Family Medicine

## 2015-06-29 ENCOUNTER — Encounter: Payer: Self-pay | Admitting: Cardiology

## 2015-07-07 ENCOUNTER — Encounter: Payer: BLUE CROSS/BLUE SHIELD | Admitting: Internal Medicine

## 2015-07-13 ENCOUNTER — Encounter: Payer: Self-pay | Admitting: Internal Medicine

## 2015-07-14 ENCOUNTER — Encounter: Payer: Self-pay | Admitting: Nurse Practitioner

## 2015-07-14 ENCOUNTER — Ambulatory Visit (INDEPENDENT_AMBULATORY_CARE_PROVIDER_SITE_OTHER): Payer: BLUE CROSS/BLUE SHIELD | Admitting: Nurse Practitioner

## 2015-07-14 VITALS — BP 130/80 | HR 84 | Ht 70.0 in | Wt 243.8 lb

## 2015-07-14 DIAGNOSIS — I255 Ischemic cardiomyopathy: Secondary | ICD-10-CM | POA: Diagnosis not present

## 2015-07-14 DIAGNOSIS — I5022 Chronic systolic (congestive) heart failure: Secondary | ICD-10-CM | POA: Diagnosis not present

## 2015-07-14 DIAGNOSIS — E088 Diabetes mellitus due to underlying condition with unspecified complications: Secondary | ICD-10-CM

## 2015-07-14 LAB — HEPATIC FUNCTION PANEL
ALT: 25 U/L (ref 9–46)
AST: 19 U/L (ref 10–35)
Albumin: 3.8 g/dL (ref 3.6–5.1)
Alkaline Phosphatase: 67 U/L (ref 40–115)
Bilirubin, Direct: 0.1 mg/dL (ref <=0.2)
Indirect Bilirubin: 0.4 mg/dL (ref 0.2–1.2)
Total Bilirubin: 0.5 mg/dL (ref 0.2–1.2)
Total Protein: 6.9 g/dL (ref 6.1–8.1)

## 2015-07-14 LAB — CBC
HCT: 46.7 % (ref 39.0–52.0)
Hemoglobin: 15.6 g/dL (ref 13.0–17.0)
MCH: 36.5 pg — ABNORMAL HIGH (ref 26.0–34.0)
MCHC: 33.4 g/dL (ref 30.0–36.0)
MCV: 109.4 fL — ABNORMAL HIGH (ref 78.0–100.0)
MPV: 9.6 fL (ref 8.6–12.4)
Platelets: 233 10*3/uL (ref 150–400)
RBC: 4.27 MIL/uL (ref 4.22–5.81)
RDW: 14.3 % (ref 11.5–15.5)
WBC: 5.3 10*3/uL (ref 4.0–10.5)

## 2015-07-14 LAB — LIPID PANEL
Cholesterol: 205 mg/dL — ABNORMAL HIGH (ref 125–200)
HDL: 24 mg/dL — ABNORMAL LOW (ref >=40)
Total CHOL/HDL Ratio: 8.5 Ratio — ABNORMAL HIGH (ref <=5.0)
Triglycerides: 623 mg/dL — ABNORMAL HIGH (ref <150)

## 2015-07-14 LAB — BASIC METABOLIC PANEL
BUN: 24 mg/dL (ref 7–25)
CO2: 24 mmol/L (ref 20–31)
Calcium: 9.2 mg/dL (ref 8.6–10.3)
Chloride: 99 mmol/L (ref 98–110)
Creat: 1.43 mg/dL — ABNORMAL HIGH (ref 0.70–1.33)
Glucose, Bld: 399 mg/dL — ABNORMAL HIGH (ref 65–99)
Potassium: 4.4 mmol/L (ref 3.5–5.3)
Sodium: 133 mmol/L — ABNORMAL LOW (ref 135–146)

## 2015-07-14 MED ORDER — NITROGLYCERIN 0.4 MG SL SUBL: 0.4000 mg | SUBLINGUAL_TABLET | SUBLINGUAL | Status: DC | PRN

## 2015-07-14 MED ORDER — SITAGLIP PHOS-METFORMIN HCL ER 100-1000 MG PO TB24: 1.0000 | ORAL_TABLET | Freq: Every day | ORAL | Status: DC

## 2015-07-14 MED ORDER — SACUBITRIL-VALSARTAN 24-26 MG PO TABS: 1.0000 | ORAL_TABLET | Freq: Two times a day (BID) | ORAL | Status: DC

## 2015-07-14 MED ORDER — ATORVASTATIN CALCIUM 20 MG PO TABS: 20.0000 mg | ORAL_TABLET | Freq: Every day | ORAL | Status: DC

## 2015-07-14 NOTE — Progress Notes (Signed)
CARDIOLOGY OFFICE NOTE  Date:  07/14/2015    Vincent Chandler Date of Birth: [birth year] Medical Record #[id number]  PCP:  Garnet Koyanagi, DO  Cardiologist:  Klein/McLean/Kasey Hansell    Chief Complaint  Patient presents with  . Congestive Heart Failure    Follow up visit - seen for Dr. Caryl Comes  . Coronary Artery Disease  . Cardiomyopathy    History of Present Illness: Vincent Chandler is a 55 y.o. male who presents today for a follow up visit. Seen for Dr. Caryl Comes and Dr. [name]. He has a known history of CAD s/p 4 vessel CABG back in 2014, hypertension, diabetes mellitus, obesity, OSA, hyperlipidemia, post cabg afib, anxiety, kidney stones, on CPAP, asthma, pneumonia x 3 and a history of noncompliance.   He presented mid January of 2016 with acute respiratory distress and VT. He apparently had run out of several of his medications and was not restricting his salt intake. He developed respiratory distress at home and went to his PCP where due to his hypoxia on the pulse ox in 80s he was sent to [facility] ED for evaluation. There he was found to have VT with 45 beats which he was not aware of and thus was transferred to [facility] for further care. He was diuresed. Troponin was elevated at 0.07. Echo revealed that his EF had dropped further to 20-25%. G3DD. Severe diffuse hypokinesis. He underwent left heart cath revealing 2/4 patent grafts with SVG-PLOM and SVG-RCA system totally occluded. There are collaterals from the LAD system to the RCA and the RCA is totally occluded proximally. There are no collaterals to the LCx territory. He then underwent successful PCI of the mid left circumflex artery with overlapping Synergy drug-eluting stents (See cath reports below). He was placed on plavix. P2Y12 showed the plavix was working appropriately. Repeat echo showed no improvement in his EF after maximizing his medicines - he was referred to Dr. Caryl Comes for ICD implant.   I have not seen him since August of 2016 -  he had changed jobs. Had had his ICD implanted. I placed him on Entresto to try and maximize his CHF regimen given his young age. He did not return for follow up.   Comes back today. Here alone. Says I'm "going to be mad at him". Has had insurance issues as well as "girlfriend" issues. Not taking all of his medicines - not taking aspirin, lipitor, Entresto or Januvia. Not checking his blood sugars. Wondering if he should be back on everything. Says that he and the girlfriend are "no more" - apparently she "was more needier" than he and he subsequently neglected himself. He is tired. Not really short of breath. Has gained weight. Tried to see Dr. Caryl Comes last week but missed because the "girl" took his car - he missed. He wants to get back on track and feel better. Lots of neuropathy in his feet. No active chest pain. No shocks. He has had a rash over the past month - not on any new medicines.    Past Medical History  Diagnosis Date  . Coronary artery disease     drug eluting stent RCA 2005-EF 30%- s/p CABG x 4; 2/4 patent grafts with SVG-PLOM and SVG-RCA system totally occluded. There are collaterals from the LAD system to the RCA and the RCA is totally occluded proximally. There are no collaterals to the LCx territory. He then underwent successful PCI of the mid left circumflex artery with overlapping Synergy drug-eluting stents  . Cardiomyopathy  alcohol use related  . Asthma   . Hypertension   . Urinary incontinence   . Hyperlipidemia   . History of hypogonadism   . H/O hiatal hernia   . GERD (gastroesophageal reflux disease)   . Anxiety   . Atrial fibrillation-postoperative 11/28/2012  . Kidney stones   . CHF (congestive heart failure) ([place])   . Anginal pain ([place])   . Pneumonia 3-4 times  . OSA on CPAP     "mask is broken; working on getting a new one" (01/15/2015)  . Diabetes mellitus type II dx'd in the 1990's  . Migraines     "used to"  . Chronic back pain   . Depression      Past Surgical History  Procedure Laterality Date  . Tonsillectomy  ~ 1970  . Coronary angioplasty with stent placement  ~ 2003  . Refractive surgery Bilateral 1990's  . Coronary artery bypass graft N/A 08/15/2012    Procedure: CORONARY ARTERY BYPASS GRAFTING (CABG);  Surgeon: Ivin Poot, MD;  Location: [facility];  Service: Open Heart Surgery;  Laterality: N/A;  Coronary Artery Bypass Grafting Times Four Using Left Internal Mammary Artery and Right Saphenous Leg Vein Harvested Endoscopically  . Left heart catheterization with coronary angiogram N/A 08/08/2012    Procedure: LEFT HEART CATHETERIZATION WITH CORONARY ANGIOGRAM;  Surgeon: Peter M Martinique, MD;  Location: [facility] CATH LAB;  Service: Cardiovascular;  Laterality: N/A;  . Left heart catheterization with coronary/graft angiogram N/A 07/21/2014    Procedure: LEFT HEART CATHETERIZATION WITH Beatrix Fetters;  Surgeon: Larey Dresser, MD;  Location: [facility] CATH LAB;  Service: Cardiovascular;  Laterality: N/A;  . Percutaneous coronary stent intervention (pci-s)  07/21/2014    Procedure: PERCUTANEOUS CORONARY STENT INTERVENTION (PCI-S);  Surgeon: Larey Dresser, MD;  Location: [facility] CATH LAB;  Service: Cardiovascular;;  . Cardiac defibrillator placement  01/15/2015  . Ep implantable device N/A 01/15/2015    Procedure: ICD Implant;  Surgeon: Deboraha Sprang, MD;  Location: [facility] CV LAB;  Service: Cardiovascular;  Laterality: N/A;     Medications: Current Outpatient Prescriptions  Medication Sig Dispense Refill  . budesonide-formoterol (SYMBICORT) 160-4.5 MCG/ACT inhaler Inhale 2 puffs into the lungs 2 (two) times daily. (Patient taking differently: Inhale 1 puff into the lungs 2 (two) times daily as needed (wheezing). ) 1 Inhaler 3  . carvedilol (COREG) 25 MG tablet Take 1 tablet (25 mg total) by mouth 2 (two) times daily with a meal. 180 tablet 3  . clopidogrel (PLAVIX) 75 MG tablet Take 1 tablet (75 mg total) by mouth daily with breakfast.  (Patient taking differently: Take 75 mg by mouth daily. ) 90 tablet 3  . fluticasone (FLONASE) 50 MCG/ACT nasal spray Place 1 spray into both nostrils 2 (two) times daily as needed for allergies or rhinitis.    . furosemide (LASIX) 40 MG tablet TAKE ONE TABLET (40MG) BY MOUTH IN THE MORNING AND HALF (20MG) TABLET IN THE EVENING. 90 tablet 0  . glimepiride (AMARYL) 2 MG tablet Take 1 tablet (2 mg total) by mouth daily before breakfast. Office visit due now 30 tablet 0  . guaiFENesin (MUCINEX) 600 MG 12 hr tablet Take 600 mg by mouth 2 (two) times daily as needed.     Marland Kitchen ibuprofen (ADVIL,MOTRIN) 200 MG tablet Take 400 mg by mouth daily as needed for headache.    . spironolactone (ALDACTONE) 25 MG tablet Take 1 tablet (25 mg total) by mouth daily. 30 tablet 11  . aspirin EC  81 MG tablet Take 81 mg by mouth daily. Reported on 07/14/2015    . atorvastatin (LIPITOR) 20 MG tablet Take 1 tablet (20 mg total) by mouth at bedtime. 30 tablet 6  . BAYER MICROLET LANCETS lancets Use as instructed to check blood sugar once a day dx code E11.65 (Patient not taking: Reported on 07/14/2015) 100 each 1  . glucose blood (BAYER CONTOUR NEXT TEST) test strip Use as instructed to check blood sugar once a day dx code E11.65 (Patient not taking: Reported on 07/14/2015) 50 each 3  . nitroGLYCERIN (NITROSTAT) 0.4 MG SL tablet Place 1 tablet (0.4 mg total) under the tongue every 5 (five) minutes as needed for chest pain. 25 tablet 3  . sacubitril-valsartan (ENTRESTO) 24-26 MG Take 1 tablet by mouth 2 (two) times daily. 60 tablet 6  . SitaGLIPtin-MetFORMIN HCl (JANUMET XR) [phone] MG TB24 Take 1 tablet by mouth at bedtime. 30 tablet 0  . Tetrahydrozoline HCl (VISINE OP) Place 1 drop into both eyes daily. As needed     No current facility-administered medications for this visit.    Allergies: No Known Allergies  Social History: The patient  reports that he has never smoked. His smokeless tobacco use includes Chew. He reports  that he drinks about 3.6 oz of alcohol per week. He reports that he does not use illicit drugs.   Family History: The patient's family history includes Diabetes in his father and mother; Hyperlipidemia in his father; Hypertension in his father and mother.   Review of Systems: Please see the history of present illness.   Otherwise, the review of systems is positive for none.   All other systems are reviewed and negative.   Physical Exam: VS:  BP 130/80 mmHg  Pulse 84  Ht _0  (1.778 m)  Wt 243 lb 12.8 oz (110.587 kg)  BMI 34.98 kg/m2 .  BMI Body mass index is 34.98 kg/(m^2).  Wt Readings from Last 3 Encounters:  07/14/15 243 lb 12.8 oz (110.587 kg)  02/02/15 231 lb 1.9 oz (104.835 kg)  01/16/15 225 lb 15.5 oz (102.5 kg)    General: Pleasant. Obese white male who is alert and in no acute distress. His weight continues to climb.  HEENT: Normal. Neck: Supple, no JVD, carotid bruits, or masses noted.  Cardiac: Regular rate and rhythm. +S3 noted. No edema.  Respiratory:  Lungs are clear to auscultation bilaterally with normal work of breathing.  GI: Soft and nontender. Abdomen is obese.  MS: No deformity or atrophy. Gait and ROM intact. Skin: Warm and dry. Color is normal. He does have a fine red rash on his left forearm noted. Neuro:  Strength and sensation are intact and no gross focal deficits noted.  Psych: Alert, appropriate and with normal affect.   LABORATORY DATA:  EKG:  EKG is not ordered today.  Lab Results  Component Value Date   WBC 5.9 01/12/2015   HGB 14.9 01/12/2015   HCT 44.4 01/12/2015   PLT 215.0 01/12/2015   GLUCOSE 166* 02/02/2015   CHOL 174 11/12/2014   TRIG 181.0* 11/12/2014   HDL 34.40* 11/12/2014   LDLDIRECT 109.0 04/09/2013   LDLCALC 103* 11/12/2014   ALT 17 11/12/2014   AST 16 11/12/2014   NA 139 02/02/2015   K 4.2 02/02/2015   CL 102 02/02/2015   CREATININE 1.47 02/02/2015   BUN 21 02/02/2015   CO2 31 02/02/2015   TSH 2.025 08/17/2012    PSA 0.24 03/10/2010   INR 1.02 07/21/2014  HGBA1C 6.1 11/12/2014   MICROALBUR 1.8 11/12/2014    BNP (last 3 results)  Recent Labs  07/17/14 1545  BNP 422.4*    ProBNP (last 3 results) No results for input(s): PROBNP in the last 8760 hours.   Other Studies Reviewed Today:  Cardiac Catheterization Procedure Note  Coronary angiography:  Left mainstem: Minimal disease.   Left anterior descending (LAD): 80% mid LAD at D2. Small-moderate D2 with 80% ostial stenosis. Large D1 with long 80% proximal stenosis. Patent LIMA-LAD. Patent SVG-large D1.   Left circumflex (LCx): 80% proximal stenosis proximal to the take-off of a large PLOM. There is a long up to 99% stenosed area in the proximal PLOM. Total occlusion of SVG-PLOM.   Right coronary artery (RCA): Total occlusion of the proximal RCA. There are left to right collaterals that appear to primarily come from the LAD system. Total occlusion of SVG-RCA system.   Left ventriculography: Not done, elevated LVEDP and recent echo.   Final Conclusions: 2/4 patent grafts with SVG-PLOM and SVG-RCA system totally occluded. There are collaterals from the LAD system to the RCA and the RCA is totally occluded proximally. There are no collaterals to the LCx territory. Given significant fall in LV systolic function, will plan PCI to LCx and PLOM. Dr Irish Lack to perform.   Recommendations: After PCI, will need repeat echo in 3 months to assess for ICD.   With elevated LV EDP, will restart IV Lasix.   Loralie Champagne 07/21/2014, 12:28 PM  PROCEDURE: PCI Left circumflex  INDICATIONS: Acute systolic heart failure, elevated troponin, bypass graft closure, NSTEMI  The risks, benefits, and details of the procedure were explained to the patient. The patient verbalized understanding and wanted to proceed. Informed written consent was obtained.  PROCEDURE TECHNIQUE: The diagnostic catheterization was performed by Dr.  [name]. This revealed severe disease in the native circumflex, up to 99% in the mid circumflex. There was a significant lesion more proximal as well. The segment with the 99% stenosis was a long segment of diffuse disease. Angiomax was used for anticoagulation. ACT was used to check that the Angiomax was therapeutic. A fielder XT wire was placed across the area of severe disease in the mid circumflex. A 2.0 x 20 balloon was used to predilate. A 2.25 x 38 Synergy stent was then deployed across the more distal area disease. A 2.5 x 16 Synergy stent was deployed in overlapping fashion across the more proximal area disease. The distal stent was postdilated with a 2.5 x 12 noncompliant balloon. The more proximal area was postdilated with a 3.0 x 12 noncompliant balloon to high pressure. Several doses of nitroglycerin were administered intracoronary. The patient tolerated the procedure well. There was an excellent angiographic appearance. A vascade closure device was deployed for hemostasis.   CONTRAST: Total of 95 cc.  COMPLICATIONS: None.    IMPRESSIONS:  1. Successful PCI of the mid left circumflex artery with overlapping Synergy drug-eluting stents as noted above.   RECOMMENDATION: Continue dual antiplatelet therapy for at least a year. He will be watched overnight. We'll continue Angiomax since he only got the Plavix while he was on the cath table. Watch for any groin bleeding. Continue diuresis per Dr. [name].     Echo Study Conclusions 10/20/2014 - Left ventricle: The cavity size was mildly dilated. Wall thickness was increased in a pattern of mild LVH. Systolic function was severely reduced. The estimated ejection fraction was in the range of 25% to 30%. Diffuse hypokinesis. There is akinesis of  the inferolateral and inferior myocardium. Features are consistent with a pseudonormal left ventricular filling pattern, with concomitant abnormal relaxation and  increased filling pressure (grade 2 diastolic dysfunction). - Mitral valve: There was mild regurgitation. - Left atrium: The atrium was mildly dilated. - Right ventricle: Systolic function was mildly reduced.  Impressions:  - Technically difficult; definity used; global hypokinesis with inferior and inferior lateral akinesis; overall severely reduced LV function; grade 2 diastolic dysfunction; mild LAE; mildly reduced RV function; mild MR; trace TR.     Assessment/Plan:  1. Systolic heart failure - EF has fallen back to 20% - it remains in this range based on recent echo. Now s/p ICD implant. Compliance once again is an issue - he says he wants to get back on track. I have restarted his medicines - this includes Lipitor, NTG, Entresto, Janumet and aspirin. Strongly encouraged to keep himself a priority.   2. Ischemic CM - EF remains low. Not doing well. Off medicines. Gained weight.   3. CAD - with failure of 2 grafts - s/p PCI to the LCX - on DAPT - No recurrent chest pain. Remains on Plavix.   4. VT - noted during last hospitalization. Now with ICD in place - no shocks but has missed his follow up with Dr. Caryl Comes - will get new appointment.   5. Noncompliance - once again, is an issue.   6. OSA - was to be set up with pulmonary sleep medicine - he has not followed thru - will try to arrange with Dr. Radford Pax.   7. Diabetes - previously seen by Dr. Dwyane Dee - off Janumet - will refill for him today to get him back on.   8. Rash - not sure what to make of this - not on any new medicines and really on less now. Seeing PCP next week.   Current medicines are reviewed with the patient today.  The patient does not have concerns regarding medicines other than what has been noted above.  The following changes have been made:  See above.  Labs/ tests ordered today include:    Orders Placed This Encounter  Procedures  . Brain natriuretic peptide  . Basic metabolic panel  . CBC   . Hepatic function panel  . TSH  . Lipid panel  . Hemoglobin A1c     Disposition:   FU with me in 4 weeks. Will arrange FU with Dr. Caryl Comes and sleep study.   Patient is agreeable to this plan and will call if any problems develop in the interim.   Signed: Burtis Junes, RN, ANP-C 07/14/2015 3:42 PM  [facility] [address] [place], [place]  [address] Phone: [phone] Fax: [phone]

## 2015-07-14 NOTE — Patient Instructions (Addendum)
We will be checking the following labs today - BNP, BMET, CBC, A1C, LFTs, TSH & LIPIDS   Medication Instructions:    Continue with your current medicines as per the med sheet   We will give you samples of aspirin 81 mg a day  I have refilled your Lipitor, NTG and Janumet (would like further refills on Janumet to come from your PCP)  Samples of Entrestor 24-26 to take two times a day    Testing/Procedures To Be Arranged:  N/A  Follow-Up:   See Dr. Caryl Comes for ICD check  See me in one month  Arrange sleep study    Other Special Instructions:   Walking/riding stationary bike/eliptical - some type of exercise - start 5 to 10 minutes each day and then add a minute a day as tolerated  Do not eat any foods that are "white"  See your PCP as planned    If you need a refill on your cardiac medications before your next appointment, please call your pharmacy.   Call the [place] office at [phone] if you have any questions, problems or concerns.

## 2015-07-15 LAB — HEMOGLOBIN A1C
Hgb A1c MFr Bld: 9.6 % — ABNORMAL HIGH (ref <5.7)
Mean Plasma Glucose: 229 mg/dL — ABNORMAL HIGH (ref <117)

## 2015-07-15 LAB — BRAIN NATRIURETIC PEPTIDE: Brain Natriuretic Peptide: 78.8 pg/mL (ref 0.0–100.0)

## 2015-07-15 LAB — TSH: TSH: 1.712 u[IU]/mL (ref 0.350–4.500)

## 2015-07-17 ENCOUNTER — Other Ambulatory Visit: Payer: Self-pay | Admitting: Physician Assistant

## 2015-07-19 ENCOUNTER — Other Ambulatory Visit: Payer: Self-pay | Admitting: *Deleted

## 2015-07-19 DIAGNOSIS — G4733 Obstructive sleep apnea (adult) (pediatric): Secondary | ICD-10-CM

## 2015-07-19 NOTE — Telephone Encounter (Signed)
Rx request sent to pharmacy.  

## 2015-07-21 ENCOUNTER — Other Ambulatory Visit: Payer: Self-pay | Admitting: Family Medicine

## 2015-07-22 ENCOUNTER — Ambulatory Visit: Payer: BLUE CROSS/BLUE SHIELD | Admitting: Family Medicine

## 2015-07-22 ENCOUNTER — Telehealth: Payer: Self-pay | Admitting: Family Medicine

## 2015-07-23 NOTE — Telephone Encounter (Signed)
Pt was no show 07/22/15 10:15am for follow up appt, pt did not reschedule, charge or no charge?

## 2015-07-23 NOTE — Telephone Encounter (Signed)
CHARGE

## 2015-07-26 ENCOUNTER — Encounter: Payer: Self-pay | Admitting: Family Medicine

## 2015-07-26 NOTE — Telephone Encounter (Signed)
Marked to charge and mailing letter

## 2015-08-08 ENCOUNTER — Other Ambulatory Visit: Payer: Self-pay | Admitting: Family Medicine

## 2015-08-09 NOTE — Telephone Encounter (Signed)
Med filled #[phone] with 0-, no refills without an appt with PCP.

## 2015-08-13 ENCOUNTER — Encounter: Payer: BLUE CROSS/BLUE SHIELD | Admitting: Internal Medicine

## 2015-08-16 ENCOUNTER — Encounter: Payer: Self-pay | Admitting: Internal Medicine

## 2015-08-17 ENCOUNTER — Ambulatory Visit (INDEPENDENT_AMBULATORY_CARE_PROVIDER_SITE_OTHER): Payer: BLUE CROSS/BLUE SHIELD | Admitting: Nurse Practitioner

## 2015-08-17 ENCOUNTER — Encounter: Payer: Self-pay | Admitting: Nurse Practitioner

## 2015-08-17 VITALS — BP 112/80 | HR 96 | Ht 70.0 in | Wt 238.8 lb

## 2015-08-17 DIAGNOSIS — I5022 Chronic systolic (congestive) heart failure: Secondary | ICD-10-CM | POA: Diagnosis not present

## 2015-08-17 DIAGNOSIS — I255 Ischemic cardiomyopathy: Secondary | ICD-10-CM

## 2015-08-17 DIAGNOSIS — G4733 Obstructive sleep apnea (adult) (pediatric): Secondary | ICD-10-CM | POA: Diagnosis not present

## 2015-08-17 DIAGNOSIS — Z91199 Patient's noncompliance with other medical treatment and regimen due to unspecified reason: Secondary | ICD-10-CM

## 2015-08-17 DIAGNOSIS — Z9119 Patient's noncompliance with other medical treatment and regimen: Secondary | ICD-10-CM | POA: Diagnosis not present

## 2015-08-17 MED ORDER — SACUBITRIL-VALSARTAN 24-26 MG PO TABS: 1.0000 | ORAL_TABLET | Freq: Two times a day (BID) | ORAL | Status: DC

## 2015-08-17 NOTE — Progress Notes (Signed)
CARDIOLOGY OFFICE NOTE  Date:  08/17/2015    [name] Date of Birth: [birth year] Medical Record #[id number]  PCP:  Garnet Koyanagi, DO  Cardiologist:  Allegra Lai    Chief Complaint  Patient presents with  . Congestive Heart Failure  . Cardiomyopathy  . Coronary Artery Disease    1 month check - seen for Dr. Allegra Lai    History of Present Illness: Vincent Chandler is a 55 y.o. male who presents today for a one month check. Seen for Dr. Caryl Comes and Dr. [name]. He has a known history of CAD s/p 4 vessel CABG back in 2014, hypertension, diabetes mellitus, obesity, OSA, hyperlipidemia, post cabg afib, anxiety, kidney stones, on CPAP, asthma, pneumonia x 3 and a history of noncompliance.   He presented mid January of 2016 with acute respiratory distress and VT. He apparently had run out of several of his medications and was not restricting his salt intake. He developed respiratory distress at home and went to his PCP where due to his hypoxia on the pulse ox in 80s he was sent to [facility] ED for evaluation. There he was found to have VT with 45 beats which he was not aware of and thus was transferred to [facility] for further care. He was diuresed. Troponin was elevated at 0.07. Echo revealed that his EF had dropped further to 20-25%. G3DD. Severe diffuse hypokinesis. He underwent left heart cath revealing 2/4 patent grafts with SVG-PLOM and SVG-RCA system totally occluded. There are collaterals from the LAD system to the RCA and the RCA is totally occluded proximally. There are no collaterals to the LCx territory. He then underwent successful PCI of the mid left circumflex artery with overlapping Synergy drug-eluting stents (See cath reports below). He was placed on plavix. P2Y12 showed the plavix was working appropriately. Repeat echo showed no improvement in his EF after maximizing his medicines - he was referred to Dr. Caryl Comes for ICD implant.   I have not seen him since August of 2016 - he  had changed jobs. Had had his ICD implanted. I placed him on Entresto to try and maximize his CHF regimen given his young age. He did not return for follow up until one month ago. Lots of issues. Was off his medicines - got him back on. Had really gotten off track with his care.   Comes back today. Here alone. Doing ok. Has lost 5 pounds. Breathing is ok. No chest pain. He "no showed" again for Dr. Caryl Comes - thought I told him he did not need this visit since he was coming back to see me. Also noted that he no showed for PCP. Says he is taking his medicines but has empty bottles - that are refillable.   Past Medical History  Diagnosis Date  . Coronary artery disease     drug eluting stent RCA 2005-EF 30%- s/p CABG x 4; 2/4 patent grafts with SVG-PLOM and SVG-RCA system totally occluded. There are collaterals from the LAD system to the RCA and the RCA is totally occluded proximally. There are no collaterals to the LCx territory. He then underwent successful PCI of the mid left circumflex artery with overlapping Synergy drug-eluting stents  . Cardiomyopathy     alcohol use related  . Asthma   . Hypertension   . Urinary incontinence   . Hyperlipidemia   . History of hypogonadism   . H/O hiatal hernia   . GERD (gastroesophageal reflux disease)   . Anxiety   .  Atrial fibrillation-postoperative 11/28/2012  . Kidney stones   . CHF (congestive heart failure) ([place])   . Anginal pain ([place])   . Pneumonia 3-4 times  . OSA on CPAP     "mask is broken; working on getting a new one" (01/15/2015)  . Diabetes mellitus type II dx'd in the 1990's  . Migraines     "used to"  . Chronic back pain   . Depression     Past Surgical History  Procedure Laterality Date  . Tonsillectomy  ~ 1970  . Coronary angioplasty with stent placement  ~ 2003  . Refractive surgery Bilateral 1990's  . Coronary artery bypass graft N/A 08/15/2012    Procedure: CORONARY ARTERY BYPASS GRAFTING (CABG);  Surgeon: Ivin Poot,  MD;  Location: [facility];  Service: Open Heart Surgery;  Laterality: N/A;  Coronary Artery Bypass Grafting Times Four Using Left Internal Mammary Artery and Right Saphenous Leg Vein Harvested Endoscopically  . Left heart catheterization with coronary angiogram N/A 08/08/2012    Procedure: LEFT HEART CATHETERIZATION WITH CORONARY ANGIOGRAM;  Surgeon: Peter M Martinique, MD;  Location: [facility] CATH LAB;  Service: Cardiovascular;  Laterality: N/A;  . Left heart catheterization with coronary/graft angiogram N/A 07/21/2014    Procedure: LEFT HEART CATHETERIZATION WITH Beatrix Fetters;  Surgeon: Larey Dresser, MD;  Location: [facility] CATH LAB;  Service: Cardiovascular;  Laterality: N/A;  . Percutaneous coronary stent intervention (pci-s)  07/21/2014    Procedure: PERCUTANEOUS CORONARY STENT INTERVENTION (PCI-S);  Surgeon: Larey Dresser, MD;  Location: [facility] CATH LAB;  Service: Cardiovascular;;  . Cardiac defibrillator placement  01/15/2015  . Ep implantable device N/A 01/15/2015    Procedure: ICD Implant;  Surgeon: Deboraha Sprang, MD;  Location: [address] CV LAB;  Service: Cardiovascular;  Laterality: N/A;     Medications: Current Outpatient Prescriptions  Medication Sig Dispense Refill  . aspirin EC 81 MG tablet Take 81 mg by mouth daily. Reported on 07/14/2015    . atorvastatin (LIPITOR) 20 MG tablet Take 1 tablet (20 mg total) by mouth at bedtime. 30 tablet 6  . BAYER MICROLET LANCETS lancets Use as instructed to check blood sugar once a day dx code E11.65 100 each 1  . budesonide-formoterol (SYMBICORT) 160-4.5 MCG/ACT inhaler Inhale 2 puffs into the lungs 2 (two) times daily. (Patient taking differently: Inhale 1 puff into the lungs 2 (two) times daily as needed (wheezing). ) 1 Inhaler 3  . carvedilol (COREG) 25 MG tablet Take 1 tablet (25 mg total) by mouth 2 (two) times daily with a meal. 180 tablet 3  . clopidogrel (PLAVIX) 75 MG tablet Take 1 tablet (75 mg total) by mouth daily with breakfast. (Patient  taking differently: Take 75 mg by mouth daily. ) 90 tablet 3  . fluticasone (FLONASE) 50 MCG/ACT nasal spray Place 1 spray into both nostrils 2 (two) times daily as needed for allergies or rhinitis.    . furosemide (LASIX) 40 MG tablet TAKE ONE TABLET (40MG) BY MOUTH IN THE MORNING AND HALF (20MG) TABLET IN THE EVENING. 90 tablet 0  . glimepiride (AMARYL) 2 MG tablet TAKE 1 TABLET (2 MG TOTAL) BY MOUTH DAILY BEFORE BREAKFAST. 30 tablet 0  . glucose blood (BAYER CONTOUR NEXT TEST) test strip Use as instructed to check blood sugar once a day dx code E11.65 50 each 3  . guaiFENesin (MUCINEX) 600 MG 12 hr tablet Take 600 mg by mouth 2 (two) times daily as needed.     Marland Kitchen ibuprofen (ADVIL,MOTRIN) 200  MG tablet Take 400 mg by mouth daily as needed for headache.    . nitroGLYCERIN (NITROSTAT) 0.4 MG SL tablet Place 1 tablet (0.4 mg total) under the tongue every 5 (five) minutes as needed for chest pain. 25 tablet 3  . sacubitril-valsartan (ENTRESTO) 24-26 MG Take 1 tablet by mouth 2 (two) times daily. 60 tablet 6  . SitaGLIPtin-MetFORMIN HCl (JANUMET XR) [id number] MG TB24 Take 1 tablet by mouth at bedtime. 30 tablet 0  . spironolactone (ALDACTONE) 25 MG tablet TAKE ONE TABLET BY MOUTH ONE TIME DAILY 30 tablet 0  . Tetrahydrozoline HCl (VISINE OP) Place 1 drop into both eyes daily. As needed     No current facility-administered medications for this visit.    Allergies: No Known Allergies  Social History: The patient  reports that he has never smoked. His smokeless tobacco use includes Chew. He reports that he drinks about 3.6 oz of alcohol per week. He reports that he does not use illicit drugs.   Family History: The patient's family history includes Diabetes in his father and mother; Hyperlipidemia in his father; Hypertension in his father and mother.   Review of Systems: Please see the history of present illness.   Otherwise, the review of systems is positive for none.   All other systems are  reviewed and negative.   Physical Exam: VS:  BP 112/80 mmHg  Pulse 96  Ht _0  (1.778 m)  Wt 238 lb 12.8 oz (108.319 kg)  BMI 34.26 kg/m2 .  BMI Body mass index is 34.26 kg/(m^2).  Wt Readings from Last 3 Encounters:  08/17/15 238 lb 12.8 oz (108.319 kg)  07/14/15 243 lb 12.8 oz (110.587 kg)  02/02/15 231 lb 1.9 oz (104.835 kg)    General: Obese. Alert and and in no acute distress.  HEENT: Normal. Neck: Supple, no JVD, carotid bruits, or masses noted.  Cardiac: Regular rate and rhythm. Soft S3. No edema.  Respiratory:  Lungs are clear to auscultation bilaterally with normal work of breathing.  GI: Soft and nontender.  MS: No deformity or atrophy. Gait and ROM intact. Skin: Warm and dry. Color is normal.  Neuro:  Strength and sensation are intact and no gross focal deficits noted.  Psych: Alert, appropriate and with normal affect.   LABORATORY DATA:  EKG:  EKG is not ordered today.   Lab Results  Component Value Date   WBC 5.3 07/14/2015   HGB 15.6 07/14/2015   HCT 46.7 07/14/2015   PLT 233 07/14/2015   GLUCOSE 399* 07/14/2015   CHOL 205* 07/14/2015   TRIG 623* 07/14/2015   HDL 24* 07/14/2015   LDLDIRECT 109.0 04/09/2013   LDLCALC NOT CALC 07/14/2015   ALT 25 07/14/2015   AST 19 07/14/2015   NA 133* 07/14/2015   K 4.4 07/14/2015   CL 99 07/14/2015   CREATININE 1.43* 07/14/2015   BUN 24 07/14/2015   CO2 24 07/14/2015   TSH 1.712 07/14/2015   PSA 0.24 03/10/2010   INR 1.02 07/21/2014   HGBA1C 9.6* 07/14/2015   MICROALBUR 1.8 11/12/2014    BNP (last 3 results) No results for input(s): BNP in the last 8760 hours.  ProBNP (last 3 results) No results for input(s): PROBNP in the last 8760 hours.   Other Studies Reviewed Today:  Cardiac Catheterization Procedure Note  Coronary angiography:  Left mainstem: Minimal disease.   Left anterior descending (LAD): 80% mid LAD at D2. Small-moderate D2 with 80% ostial stenosis. Large D1 with long 80%  proximal stenosis. Patent LIMA-LAD. Patent SVG-large D1.   Left circumflex (LCx): 80% proximal stenosis proximal to the take-off of a large PLOM. There is a long up to 99% stenosed area in the proximal PLOM. Total occlusion of SVG-PLOM.   Right coronary artery (RCA): Total occlusion of the proximal RCA. There are left to right collaterals that appear to primarily come from the LAD system. Total occlusion of SVG-RCA system.   Left ventriculography: Not done, elevated LVEDP and recent echo.   Final Conclusions: 2/4 patent grafts with SVG-PLOM and SVG-RCA system totally occluded. There are collaterals from the LAD system to the RCA and the RCA is totally occluded proximally. There are no collaterals to the LCx territory. Given significant fall in LV systolic function, will plan PCI to LCx and PLOM. Dr Irish Lack to perform.   Recommendations: After PCI, will need repeat echo in 3 months to assess for ICD.   With elevated LV EDP, will restart IV Lasix.   Loralie Champagne 07/21/2014, 12:28 PM  PROCEDURE: PCI Left circumflex  INDICATIONS: Acute systolic heart failure, elevated troponin, bypass graft closure, NSTEMI  The risks, benefits, and details of the procedure were explained to the patient. The patient verbalized understanding and wanted to proceed. Informed written consent was obtained.  PROCEDURE TECHNIQUE: The diagnostic catheterization was performed by Dr. [name]. This revealed severe disease in the native circumflex, up to 99% in the mid circumflex. There was a significant lesion more proximal as well. The segment with the 99% stenosis was a long segment of diffuse disease. Angiomax was used for anticoagulation. ACT was used to check that the Angiomax was therapeutic. A fielder XT wire was placed across the area of severe disease in the mid circumflex. A 2.0 x 20 balloon was used to predilate. A 2.25 x 38 Synergy stent was then deployed across the more distal area  disease. A 2.5 x 16 Synergy stent was deployed in overlapping fashion across the more proximal area disease. The distal stent was postdilated with a 2.5 x 12 noncompliant balloon. The more proximal area was postdilated with a 3.0 x 12 noncompliant balloon to high pressure. Several doses of nitroglycerin were administered intracoronary. The patient tolerated the procedure well. There was an excellent angiographic appearance. A vascade closure device was deployed for hemostasis.   CONTRAST: Total of 95 cc.  COMPLICATIONS: None.    IMPRESSIONS:  1. Successful PCI of the mid left circumflex artery with overlapping Synergy drug-eluting stents as noted above.   RECOMMENDATION: Continue dual antiplatelet therapy for at least a year. He will be watched overnight. We'll continue Angiomax since he only got the Plavix while he was on the cath table. Watch for any groin bleeding. Continue diuresis per Dr. [name].     Echo Study Conclusions 10/20/2014 - Left ventricle: The cavity size was mildly dilated. Wall thickness was increased in a pattern of mild LVH. Systolic function was severely reduced. The estimated ejection fraction was in the range of 25% to 30%. Diffuse hypokinesis. There is akinesis of the inferolateral and inferior myocardium. Features are consistent with a pseudonormal left ventricular filling pattern, with concomitant abnormal relaxation and increased filling pressure (grade 2 diastolic dysfunction). - Mitral valve: There was mild regurgitation. - Left atrium: The atrium was mildly dilated. - Right ventricle: Systolic function was mildly reduced.  Impressions:  - Technically difficult; definity used; global hypokinesis with inferior and inferior lateral akinesis; overall severely reduced LV function; grade 2 diastolic dysfunction; mild LAE; mildly reduced RV function; mild  MR; trace TR.     Assessment/Plan:  1. Systolic heart failure - EF has  fallen back to 20% - it remains in this range based on recent echo. Now s/p ICD implant. Compliance remains an issue. I am not convinced that he is taking his medicines as prescribed. He has refills on all of his bottles but some are empty today.    2. Ischemic CM - EF remains low. Currently compensated.   3. CAD - with failure of 2 grafts - s/p PCI to the LCX - on DAPT - No recurrent chest pain. Remains on Plavix.   4. VT - noted during last hospitalization. Now with ICD in place - no shocks but has missed his follow up with Dr. Caryl Comes again - will get new appointment.   5. Noncompliance - once again, is an issue. Unfortunately, I do not see this changing.   6. OSA - sleep study next month  7. Diabetes - uncontrolled - needs to get back to PCP  Current medicines are reviewed with the patient today.  The patient does not have concerns regarding medicines other than what has been noted above.  The following changes have been made:  See above.  Labs/ tests ordered today include:    Orders Placed This Encounter  Procedures  . Basic metabolic panel     Disposition:   FU with me in June.    Patient is agreeable to this plan and will call if any problems develop in the interim.   Signed: Burtis Junes, RN, ANP-C 08/17/2015 11:34 AM  [place] [address] [place], [place]  [address] Phone: [phone] Fax: [phone]

## 2015-08-17 NOTE — Patient Instructions (Addendum)
We will be checking the following labs today - BMET   Medication Instructions:    Continue with your current medicines.   I have given you a RX for the [facility] for pricing options - let me know if this is cost prohibitive    Testing/Procedures To Be Arranged:  N/A  Follow-Up:   See Dr. Caryl Comes - in April -  Please keep this appointment  See me in June  Make appointment with PCP for diabetes   Other Special Instructions:   N/A    If you need a refill on your cardiac medications before your next appointment, please call your pharmacy.   Call the [place] office at [phone] if you have any questions, problems or concerns.

## 2015-09-12 ENCOUNTER — Ambulatory Visit (HOSPITAL_BASED_OUTPATIENT_CLINIC_OR_DEPARTMENT_OTHER): Payer: BLUE CROSS/BLUE SHIELD | Attending: Interventional Cardiology

## 2015-09-12 ENCOUNTER — Other Ambulatory Visit: Payer: Self-pay | Admitting: Family Medicine

## 2015-09-13 NOTE — Telephone Encounter (Signed)
Patient has not been seen in over a year, please schedule him a follow up with Dr.Lowne. Thanks      KP

## 2015-09-14 NOTE — Telephone Encounter (Signed)
Left message for patient to call the office to schedule an Office Visit with Dr. Etter Sjogren

## 2015-09-16 ENCOUNTER — Encounter (HOSPITAL_BASED_OUTPATIENT_CLINIC_OR_DEPARTMENT_OTHER): Payer: BLUE CROSS/BLUE SHIELD

## 2015-09-17 ENCOUNTER — Other Ambulatory Visit: Payer: Self-pay | Admitting: Nurse Practitioner

## 2015-09-21 ENCOUNTER — Encounter: Payer: Self-pay | Admitting: Family Medicine

## 2015-09-21 ENCOUNTER — Ambulatory Visit (INDEPENDENT_AMBULATORY_CARE_PROVIDER_SITE_OTHER): Payer: BLUE CROSS/BLUE SHIELD | Admitting: Family Medicine

## 2015-09-21 ENCOUNTER — Encounter: Payer: Self-pay | Admitting: Gastroenterology

## 2015-09-21 VITALS — BP 114/76 | HR 94 | Temp 98.2°F | Ht 70.0 in | Wt 238.0 lb

## 2015-09-21 DIAGNOSIS — IMO0001 Reserved for inherently not codable concepts without codable children: Secondary | ICD-10-CM

## 2015-09-21 DIAGNOSIS — I5022 Chronic systolic (congestive) heart failure: Secondary | ICD-10-CM

## 2015-09-21 DIAGNOSIS — E1149 Type 2 diabetes mellitus with other diabetic neurological complication: Secondary | ICD-10-CM

## 2015-09-21 DIAGNOSIS — I2510 Atherosclerotic heart disease of native coronary artery without angina pectoris: Secondary | ICD-10-CM

## 2015-09-21 DIAGNOSIS — Z0000 Encounter for general adult medical examination without abnormal findings: Secondary | ICD-10-CM

## 2015-09-21 DIAGNOSIS — E785 Hyperlipidemia, unspecified: Secondary | ICD-10-CM | POA: Diagnosis not present

## 2015-09-21 DIAGNOSIS — I10 Essential (primary) hypertension: Secondary | ICD-10-CM

## 2015-09-21 DIAGNOSIS — Z1159 Encounter for screening for other viral diseases: Secondary | ICD-10-CM

## 2015-09-21 DIAGNOSIS — I255 Ischemic cardiomyopathy: Secondary | ICD-10-CM

## 2015-09-21 DIAGNOSIS — L309 Dermatitis, unspecified: Secondary | ICD-10-CM

## 2015-09-21 DIAGNOSIS — E1165 Type 2 diabetes mellitus with hyperglycemia: Secondary | ICD-10-CM

## 2015-09-21 DIAGNOSIS — IMO0002 Reserved for concepts with insufficient information to code with codable children: Secondary | ICD-10-CM

## 2015-09-21 LAB — POCT URINALYSIS DIPSTICK
BILIRUBIN UA: NEGATIVE
Glucose, UA: NEGATIVE
KETONES UA: NEGATIVE
LEUKOCYTES UA: NEGATIVE
Nitrite, UA: NEGATIVE
PH UA: 5.5
Protein, UA: NEGATIVE
RBC UA: NEGATIVE
Spec Grav, UA: 1.025
Urobilinogen, UA: 0.2

## 2015-09-21 LAB — LIPID PANEL
CHOLESTEROL: 192 mg/dL (ref 0–200)
HDL: 30.9 mg/dL — AB (ref >39.00)
NonHDL: 161.51
TRIGLYCERIDES: 202 mg/dL — AB (ref 0.0–149.0)
Total CHOL/HDL Ratio: 6
VLDL: 40.4 mg/dL — AB (ref 0.0–40.0)

## 2015-09-21 LAB — MICROALBUMIN / CREATININE URINE RATIO
Creatinine,U: 234.5 mg/dL
MICROALB UR: 1.9 mg/dL (ref 0.0–1.9)
Microalb Creat Ratio: 0.8 mg/g (ref 0.0–30.0)

## 2015-09-21 LAB — CBC WITH DIFFERENTIAL/PLATELET
BASOS ABS: 0 10*3/uL (ref 0.0–0.1)
Basophils Relative: 0.6 % (ref 0.0–3.0)
EOS ABS: 0.3 10*3/uL (ref 0.0–0.7)
Eosinophils Relative: 4.3 % (ref 0.0–5.0)
HEMATOCRIT: 45.8 % (ref 39.0–52.0)
HEMOGLOBIN: 15.3 g/dL (ref 13.0–17.0)
LYMPHS PCT: 19.4 % (ref 12.0–46.0)
Lymphs Abs: 1.4 10*3/uL (ref 0.7–4.0)
MCHC: 33.4 g/dL (ref 30.0–36.0)
MCV: 107.2 fl — ABNORMAL HIGH (ref 78.0–100.0)
MONOS PCT: 10.3 % (ref 3.0–12.0)
Monocytes Absolute: 0.7 10*3/uL (ref 0.1–1.0)
NEUTROS PCT: 65.4 % (ref 43.0–77.0)
Neutro Abs: 4.8 10*3/uL (ref 1.4–7.7)
Platelets: 257 10*3/uL (ref 150.0–400.0)
RBC: 4.28 Mil/uL (ref 4.22–5.81)
RDW: 15.9 % — ABNORMAL HIGH (ref 11.5–15.5)
WBC: 7.3 10*3/uL (ref 4.0–10.5)

## 2015-09-21 LAB — COMPREHENSIVE METABOLIC PANEL
ALBUMIN: 4 g/dL (ref 3.5–5.2)
ALT: 14 U/L (ref 0–53)
AST: 12 U/L (ref 0–37)
Alkaline Phosphatase: 70 U/L (ref 39–117)
BILIRUBIN TOTAL: 0.7 mg/dL (ref 0.2–1.2)
BUN: 22 mg/dL (ref 6–23)
CALCIUM: 9.3 mg/dL (ref 8.4–10.5)
CHLORIDE: 100 meq/L (ref 96–112)
CO2: 29 mEq/L (ref 19–32)
CREATININE: 1.48 mg/dL (ref 0.40–1.50)
GFR: 52.62 mL/min — AB (ref >60.00)
Glucose, Bld: 128 mg/dL — ABNORMAL HIGH (ref 70–99)
Potassium: 4.3 mEq/L (ref 3.5–5.1)
Sodium: 137 mEq/L (ref 135–145)
Total Protein: 7.1 g/dL (ref 6.0–8.3)

## 2015-09-21 LAB — LDL CHOLESTEROL, DIRECT: LDL DIRECT: 134 mg/dL

## 2015-09-21 LAB — TSH: TSH: 3.43 u[IU]/mL (ref 0.35–4.50)

## 2015-09-21 LAB — PSA: PSA: 0.26 ng/mL (ref 0.10–4.00)

## 2015-09-21 LAB — HEMOGLOBIN A1C: HEMOGLOBIN A1C: 8.3 % — AB (ref 4.6–6.5)

## 2015-09-21 MED ORDER — TRIAMCINOLONE ACETONIDE 0.5 % EX CREA: 1.0000 "application " | TOPICAL_CREAM | Freq: Three times a day (TID) | CUTANEOUS | Status: DC

## 2015-09-21 MED ORDER — ATORVASTATIN CALCIUM 20 MG PO TABS: 20.0000 mg | ORAL_TABLET | Freq: Every day | ORAL | Status: DC

## 2015-09-21 NOTE — Progress Notes (Signed)
Patient ID: Vincent Chandler, male    DOB: 01/25/1961  Age: 55 y.o. MRN: [id number]    Subjective:  Subjective HPI [name] presents for cpe and labs.  Pt has not been taking care of himself (per pt) and he knows his dm is uncontrolled-- not checking his blood sugars.   Review of Systems  Constitutional: Negative for diaphoresis, appetite change, fatigue and unexpected weight change.  Eyes: Negative for pain, redness and visual disturbance.  Respiratory: Negative for cough, chest tightness, shortness of breath and wheezing.   Cardiovascular: Negative for chest pain, palpitations and leg swelling.  Endocrine: Negative for cold intolerance, heat intolerance, polydipsia, polyphagia and polyuria.  Genitourinary: Negative for dysuria, frequency and difficulty urinating.  Neurological: Negative for dizziness, light-headedness, numbness and headaches.    History Past Medical History  Diagnosis Date  . Coronary artery disease     drug eluting stent RCA 2005-EF 30%- s/p CABG x 4; 2/4 patent grafts with SVG-PLOM and SVG-RCA system totally occluded. There are collaterals from the LAD system to the RCA and the RCA is totally occluded proximally. There are no collaterals to the LCx territory. He then underwent successful PCI of the mid left circumflex artery with overlapping Synergy drug-eluting stents  . Cardiomyopathy     alcohol use related  . Asthma   . Hypertension   . Urinary incontinence   . Hyperlipidemia   . History of hypogonadism   . H/O hiatal hernia   . GERD (gastroesophageal reflux disease)   . Anxiety   . Atrial fibrillation-postoperative 11/28/2012  . Kidney stones   . CHF (congestive heart failure) ([place])   . Anginal pain ([place])   . Pneumonia 3-4 times  . OSA on CPAP     "mask is broken; working on getting a new one" (01/15/2015)  . Diabetes mellitus type II dx'd in the 1990's  . Migraines     "used to"  . Chronic back pain   . Depression     He has past surgical  history that includes Tonsillectomy (~ 1970); Coronary angioplasty with stent (~ 2003); Refractive surgery (Bilateral, 1990's); Coronary artery bypass graft (N/A, 08/15/2012); left heart catheterization with coronary angiogram (N/A, 08/08/2012); left heart catheterization with coronary/graft angiogram (N/A, 07/21/2014); percutaneous coronary stent intervention (pci-s) (07/21/2014); Cardiac defibrillator placement (01/15/2015); and Cardiac catheterization (N/A, 01/15/2015).   His family history includes Diabetes in his father and mother; Hyperlipidemia in his father; Hypertension in his father and mother.He reports that he has never smoked. His smokeless tobacco use includes Chew. He reports that he drinks about 3.6 oz of alcohol per week. He reports that he does not use illicit drugs.  Current Outpatient Prescriptions on File Prior to Visit  Medication Sig Dispense Refill  . aspirin EC 81 MG tablet Take 81 mg by mouth daily. Reported on 07/14/2015    . BAYER MICROLET LANCETS lancets Use as instructed to check blood sugar once a day dx code E11.65 100 each 1  . budesonide-formoterol (SYMBICORT) 160-4.5 MCG/ACT inhaler Inhale 2 puffs into the lungs 2 (two) times daily. (Patient taking differently: Inhale 1 puff into the lungs 2 (two) times daily as needed (wheezing). ) 1 Inhaler 3  . carvedilol (COREG) 25 MG tablet Take 1 tablet (25 mg total) by mouth 2 (two) times daily with a meal. 180 tablet 3  . clopidogrel (PLAVIX) 75 MG tablet Take 1 tablet (75 mg total) by mouth daily with breakfast. (Patient taking differently: Take 75 mg by mouth daily. ) 90  tablet 3  . fluticasone (FLONASE) 50 MCG/ACT nasal spray Place 1 spray into both nostrils 2 (two) times daily as needed for allergies or rhinitis.    . furosemide (LASIX) 40 MG tablet TAKE ONE TABLET (40MG) BY MOUTH IN THE MORNING AND HALF (20MG) TABLET IN THE EVENING. 90 tablet 0  . glimepiride (AMARYL) 2 MG tablet TAKE 1 TABLET (2 MG TOTAL) BY MOUTH DAILY BEFORE  BREAKFAST. 30 tablet 0  . glucose blood (BAYER CONTOUR NEXT TEST) test strip Use as instructed to check blood sugar once a day dx code E11.65 50 each 3  . ibuprofen (ADVIL,MOTRIN) 200 MG tablet Take 400 mg by mouth daily as needed for headache.    . nitroGLYCERIN (NITROSTAT) 0.4 MG SL tablet Place 1 tablet (0.4 mg total) under the tongue every 5 (five) minutes as needed for chest pain. 25 tablet 3  . sacubitril-valsartan (ENTRESTO) 24-26 MG Take 1 tablet by mouth 2 (two) times daily. 60 tablet 6  . SitaGLIPtin-MetFORMIN HCl (JANUMET XR) [phone] MG TB24 Take 1 tablet by mouth at bedtime. 30 tablet 0  . spironolactone (ALDACTONE) 25 MG tablet TAKE ONE TABLET BY MOUTH ONE TIME DAILY 30 tablet 0  . Tetrahydrozoline HCl (VISINE OP) Place 1 drop into both eyes daily. As needed     No current facility-administered medications on file prior to visit.     Objective:  Objective Physical Exam  Constitutional: He is oriented to person, place, and time. Vital signs are normal. He appears well-developed and well-nourished. He is sleeping. No distress.  HENT:  Head: Normocephalic and atraumatic.  Right Ear: External ear normal.  Left Ear: External ear normal.  Nose: Nose normal.  Mouth/Throat: Oropharynx is clear and moist. No oropharyngeal exudate.  Eyes: Conjunctivae and EOM are normal. Pupils are equal, round, and reactive to light. Right eye exhibits no discharge. Left eye exhibits no discharge.  Neck: Normal range of motion. Neck supple. No JVD present. No thyromegaly present.  Cardiovascular: Normal rate, regular rhythm and intact distal pulses.  Exam reveals no gallop and no friction rub.   No murmur heard. Pulmonary/Chest: Effort normal and breath sounds normal. No respiratory distress. He has no wheezes. He has no rales. He exhibits no tenderness.  Abdominal: Soft. Bowel sounds are normal. He exhibits no distension and no mass. There is no tenderness. There is no rebound and no guarding.    Genitourinary: Rectum normal, prostate normal and penis normal. Guaiac negative stool.  Musculoskeletal: Normal range of motion. He exhibits no edema or tenderness.  Lymphadenopathy:    He has no cervical adenopathy.  Neurological: He is alert and oriented to person, place, and time. He displays normal reflexes. He exhibits normal muscle tone.  Sensory exam of the foot is not normal, tested with the monofilament.-- unable to feel bottom big toe on R foot and top of L foot.  Good pulses, no lesions or ulcers, good peripheral pulses.   Skin: Skin is warm and dry. No rash noted. He is not diaphoretic. No erythema. No pallor.  Psychiatric: He has a normal mood and affect. His behavior is normal. Judgment and thought content normal.  Nursing note and vitals reviewed.  BP 114/76 mmHg  Pulse 94  Temp(Src) 98.2 F (36.8 C) (Oral)  Ht _0  (1.778 m)  Wt 238 lb (107.956 kg)  BMI 34.15 kg/m2  SpO2 95% Wt Readings from Last 3 Encounters:  09/21/15 238 lb (107.956 kg)  08/17/15 238 lb 12.8 oz (108.319 kg)  07/14/15 243 lb 12.8 oz (110.587 kg)     Lab Results  Component Value Date   WBC 7.3 09/21/2015   HGB 15.3 09/21/2015   HCT 45.8 09/21/2015   PLT 257.0 09/21/2015   GLUCOSE 128* 09/21/2015   CHOL 192 09/21/2015   TRIG 202.0* 09/21/2015   HDL 30.90* 09/21/2015   LDLDIRECT 134.0 09/21/2015   LDLCALC NOT CALC 07/14/2015   ALT 14 09/21/2015   AST 12 09/21/2015   NA 137 09/21/2015   K 4.3 09/21/2015   CL 100 09/21/2015   CREATININE 1.48 09/21/2015   BUN 22 09/21/2015   CO2 29 09/21/2015   TSH 3.43 09/21/2015   PSA 0.26 09/21/2015   INR 1.02 07/21/2014   HGBA1C 8.3* 09/21/2015   MICROALBUR 1.9 09/21/2015    Dg Chest 2 View  01/16/2015  CLINICAL DATA:  Status post pacemaker insertion. EXAM: CHEST  2 VIEW COMPARISON:  07/17/2014 FINDINGS: Left-sided transvenous pacemaker lead to the right ventricle. No pneumothorax. Status post median sternotomy and CABG. The heart is enlarged.  There is minimal atelectasis versus scarring at the lateral right lung base. No pulmonary edema. IMPRESSION: 1. Interval placement of left-sided transvenous pacemaker. 2. Cardiomegaly, stable. Electronically Signed   By: Nolon Nations M.D.   On: 01/16/2015 07:29     Assessment & Plan:  Plan I have discontinued Mr. Anastasi guaiFENesin and JANUMET XR. I am also having him start on triamcinolone cream. Additionally, I am having him maintain his aspirin EC, budesonide-formoterol, clopidogrel, glucose blood, BAYER MICROLET LANCETS, carvedilol, fluticasone, ibuprofen, Tetrahydrozoline HCl (VISINE OP), nitroGLYCERIN, SitaGLIPtin-MetFORMIN HCl, sacubitril-valsartan, glimepiride, spironolactone, furosemide, and atorvastatin.  Meds ordered this encounter  Medications  . atorvastatin (LIPITOR) 20 MG tablet    Sig: Take 1 tablet (20 mg total) by mouth at bedtime.    Dispense:  30 tablet    Refill:  6  . triamcinolone cream (KENALOG) 0.5 %    Sig: Apply 1 application topically 3 (three) times daily.    Dispense:  30 g    Refill:  0    Problem List Items Addressed This Visit      Unprioritized   Diabetes mellitus type II, uncontrolled ([place]) (Chronic)     Was referred to endo but pt has not been Check labs con't meds Pt admits to not following diet      Relevant Medications   atorvastatin (LIPITOR) 20 MG tablet   Hyperlipidemia (Chronic)    con't statin Check labs      Relevant Medications   atorvastatin (LIPITOR) 20 MG tablet   Essential hypertension    Coreg and spironolactone Stable  Check labs      Relevant Medications   atorvastatin (LIPITOR) 20 MG tablet   Other Relevant Orders   Comp Met (CMET) (Completed)   CBC with Differential/Platelet (Completed)   Hemoglobin A1c (Completed)   Lipid panel (Completed)   Microalbumin / creatinine urine ratio (Completed)   POCT urinalysis dipstick (Completed)   PSA (Completed)   TSH (Completed)   Coronary artery disease prior RCA  stent with new inferior Q waves    Cont coreg and statin F/u cardiology      Relevant Medications   atorvastatin (LIPITOR) 20 MG tablet   Ischemic and nonischemic cardiomyopathy     Relevant Medications   atorvastatin (LIPITOR) 20 MG tablet   Other Relevant Orders   Comp Met (CMET) (Completed)   CBC with Differential/Platelet (Completed)   Hemoglobin A1c (Completed)   Lipid panel (Completed)  Microalbumin / creatinine urine ratio (Completed)   POCT urinalysis dipstick (Completed)   PSA (Completed)   TSH (Completed)    Other Visit Diagnoses    Preventative health care    -  Primary    Relevant Orders    Ambulatory referral to Gastroenterology    Chronic systolic heart failure (HCC)        Relevant Medications    atorvastatin (LIPITOR) 20 MG tablet    Other Relevant Orders    Comp Met (CMET) (Completed)    CBC with Differential/Platelet (Completed)    Hemoglobin A1c (Completed)    Lipid panel (Completed)    Microalbumin / creatinine urine ratio (Completed)    POCT urinalysis dipstick (Completed)    PSA (Completed)    TSH (Completed)    Hyperlipidemia LDL goal <70        Relevant Medications    atorvastatin (LIPITOR) 20 MG tablet    Other Relevant Orders    Comp Met (CMET) (Completed)    CBC with Differential/Platelet (Completed)    Hemoglobin A1c (Completed)    Lipid panel (Completed)    Microalbumin / creatinine urine ratio (Completed)    POCT urinalysis dipstick (Completed)    PSA (Completed)    TSH (Completed)    Diabetes mellitus with neurological manifestations, uncontrolled (HCC)        Relevant Medications    atorvastatin (LIPITOR) 20 MG tablet    Other Relevant Orders    Comp Met (CMET) (Completed)    CBC with Differential/Platelet (Completed)    Hemoglobin A1c (Completed)    Lipid panel (Completed)    Microalbumin / creatinine urine ratio (Completed)    POCT urinalysis dipstick (Completed)    PSA (Completed)    TSH (Completed)    Ambulatory referral  to Ophthalmology    Need for hepatitis C screening test        Relevant Orders    Hepatitis C antibody (Completed)    Dermatitis        Relevant Medications    triamcinolone cream (KENALOG) 0.5 %       Follow-up: Return in about 3 months (around 12/22/2015), or if symptoms worsen or fail to improve, for hypertension, diabetes II, hyperlipidemia.  Garnet Koyanagi, DO

## 2015-09-21 NOTE — Patient Instructions (Signed)

## 2015-09-21 NOTE — Progress Notes (Signed)
Pre visit review using our clinic review tool, if applicable. No additional management support is needed unless otherwise documented below in the visit note.

## 2015-09-22 LAB — HEPATITIS C ANTIBODY: HCV AB: NEGATIVE

## 2015-09-22 NOTE — Assessment & Plan Note (Addendum)
Was referred to endo but pt has not been Check labs con't meds Pt admits to not following diet

## 2015-09-22 NOTE — Assessment & Plan Note (Signed)
con't statin Check labs

## 2015-09-22 NOTE — Assessment & Plan Note (Signed)
Con't coreg and spironolactone  con't enresto per cardiology

## 2015-09-22 NOTE — Assessment & Plan Note (Signed)
Coreg and spironolactone Stable  Check labs

## 2015-09-22 NOTE — Assessment & Plan Note (Signed)
Cont coreg and statin F/u cardiology

## 2015-10-08 DIAGNOSIS — I5022 Chronic systolic (congestive) heart failure: Secondary | ICD-10-CM

## 2015-10-08 DIAGNOSIS — E785 Hyperlipidemia, unspecified: Secondary | ICD-10-CM

## 2015-10-08 DIAGNOSIS — I255 Ischemic cardiomyopathy: Secondary | ICD-10-CM

## 2015-10-08 MED ORDER — ATORVASTATIN CALCIUM 40 MG PO TABS: 40.0000 mg | ORAL_TABLET | Freq: Every day | ORAL | Status: DC

## 2015-10-08 MED ORDER — SITAGLIP PHOS-METFORMIN HCL ER 50-1000 MG PO TB24: 2.0000 | ORAL_TABLET | Freq: Every day | ORAL | Status: DC

## 2015-10-14 ENCOUNTER — Other Ambulatory Visit: Payer: Self-pay | Admitting: Family Medicine

## 2015-10-18 ENCOUNTER — Encounter: Payer: BLUE CROSS/BLUE SHIELD | Admitting: Internal Medicine

## 2015-10-19 ENCOUNTER — Encounter: Payer: Self-pay | Admitting: Internal Medicine

## 2015-10-22 ENCOUNTER — Other Ambulatory Visit: Payer: Self-pay | Admitting: Family Medicine

## 2015-10-22 MED ORDER — SITAGLIP PHOS-METFORMIN HCL ER 50-1000 MG PO TB24: 2.0000 | ORAL_TABLET | Freq: Every day | ORAL | Status: DC

## 2015-11-11 ENCOUNTER — Other Ambulatory Visit: Payer: Self-pay | Admitting: Nurse Practitioner

## 2015-11-12 ENCOUNTER — Ambulatory Visit: Payer: BLUE CROSS/BLUE SHIELD | Admitting: Gastroenterology

## 2015-12-13 ENCOUNTER — Other Ambulatory Visit: Payer: Self-pay | Admitting: Nurse Practitioner

## 2015-12-14 ENCOUNTER — Telehealth: Payer: Self-pay | Admitting: *Deleted

## 2015-12-14 ENCOUNTER — Ambulatory Visit (HOSPITAL_COMMUNITY)
Admission: RE | Admit: 2015-12-14 | Discharge: 2015-12-14 | Disposition: A | Discharge status: Home / Self Care | Payer: BLUE CROSS/BLUE SHIELD | Source: Ambulatory Visit | Attending: Cardiology | Admitting: Cardiology

## 2015-12-14 ENCOUNTER — Encounter: Payer: Self-pay | Admitting: Nurse Practitioner

## 2015-12-14 ENCOUNTER — Ambulatory Visit (INDEPENDENT_AMBULATORY_CARE_PROVIDER_SITE_OTHER): Payer: BLUE CROSS/BLUE SHIELD | Admitting: Nurse Practitioner

## 2015-12-14 ENCOUNTER — Encounter: Payer: Self-pay | Admitting: Internal Medicine

## 2015-12-14 ENCOUNTER — Ambulatory Visit (INDEPENDENT_AMBULATORY_CARE_PROVIDER_SITE_OTHER): Payer: BLUE CROSS/BLUE SHIELD | Admitting: Internal Medicine

## 2015-12-14 VITALS — BP 154/90 | HR 92 | Ht 70.0 in | Wt 233.8 lb

## 2015-12-14 DIAGNOSIS — K219 Gastro-esophageal reflux disease without esophagitis: Secondary | ICD-10-CM | POA: Diagnosis not present

## 2015-12-14 DIAGNOSIS — F419 Anxiety disorder, unspecified: Secondary | ICD-10-CM | POA: Insufficient documentation

## 2015-12-14 DIAGNOSIS — M7989 Other specified soft tissue disorders: Secondary | ICD-10-CM

## 2015-12-14 DIAGNOSIS — I110 Hypertensive heart disease with heart failure: Secondary | ICD-10-CM | POA: Diagnosis not present

## 2015-12-14 DIAGNOSIS — F329 Major depressive disorder, single episode, unspecified: Secondary | ICD-10-CM | POA: Insufficient documentation

## 2015-12-14 DIAGNOSIS — I509 Heart failure, unspecified: Secondary | ICD-10-CM | POA: Diagnosis not present

## 2015-12-14 DIAGNOSIS — E785 Hyperlipidemia, unspecified: Secondary | ICD-10-CM | POA: Insufficient documentation

## 2015-12-14 DIAGNOSIS — E119 Type 2 diabetes mellitus without complications: Secondary | ICD-10-CM | POA: Insufficient documentation

## 2015-12-14 DIAGNOSIS — I2510 Atherosclerotic heart disease of native coronary artery without angina pectoris: Secondary | ICD-10-CM | POA: Diagnosis not present

## 2015-12-14 DIAGNOSIS — I255 Ischemic cardiomyopathy: Secondary | ICD-10-CM | POA: Diagnosis not present

## 2015-12-14 DIAGNOSIS — I82612 Acute embolism and thrombosis of superficial veins of left upper extremity: Secondary | ICD-10-CM | POA: Insufficient documentation

## 2015-12-14 DIAGNOSIS — G4733 Obstructive sleep apnea (adult) (pediatric): Secondary | ICD-10-CM | POA: Diagnosis not present

## 2015-12-14 DIAGNOSIS — Z95810 Presence of automatic (implantable) cardiac defibrillator: Secondary | ICD-10-CM

## 2015-12-14 DIAGNOSIS — I5022 Chronic systolic (congestive) heart failure: Secondary | ICD-10-CM

## 2015-12-14 LAB — CUP PACEART INCLINIC DEVICE CHECK
Battery Voltage: 3.04 V
Brady Statistic RV Percent Paced: 0.01 %
HighPow Impedance: 66 Ohm
Implantable Lead Implant Date: 20160715
Implantable Lead Model: 293
Lead Channel Impedance Value: 532 Ohm
Lead Channel Impedance Value: 532 Ohm
Lead Channel Pacing Threshold Amplitude: 0.875 V
Lead Channel Sensing Intrinsic Amplitude: 8.75 mV
Lead Channel Setting Pacing Amplitude: 2.5 V
Lead Channel Setting Pacing Pulse Width: 0.4 ms
MDC IDC LEAD LOCATION: 753862
MDC IDC LEAD SERIAL: 382379
MDC IDC MSMT BATTERY REMAINING LONGEVITY: 134 mo
MDC IDC MSMT LEADCHNL RV PACING THRESHOLD PULSEWIDTH: 0.4 ms
MDC IDC SESS DTM: 20170613154324
MDC IDC SET LEADCHNL RV SENSING SENSITIVITY: 0.3 mV

## 2015-12-14 LAB — CBC WITH DIFFERENTIAL/PLATELET
Basophils Absolute: 0 cells/uL (ref 0–200)
Basophils Relative: 0 %
Eosinophils Absolute: 196 cells/uL (ref 15–500)
Eosinophils Relative: 4 %
HCT: 45 % (ref 38.5–50.0)
Hemoglobin: 14.9 g/dL (ref 13.2–17.1)
Lymphocytes Relative: 29 %
Lymphs Abs: 1421 cells/uL (ref 850–3900)
MCH: 37 pg — ABNORMAL HIGH (ref 27.0–33.0)
MCHC: 33.1 g/dL (ref 32.0–36.0)
MCV: 111.7 fL — ABNORMAL HIGH (ref 80.0–100.0)
MPV: 10.1 fL (ref 7.5–12.5)
Monocytes Absolute: 392 cells/uL (ref 200–950)
Monocytes Relative: 8 %
Neutro Abs: 2891 cells/uL (ref 1500–7800)
Neutrophils Relative %: 59 %
Platelets: 204 10*3/uL (ref 140–400)
RBC: 4.03 MIL/uL — ABNORMAL LOW (ref 4.20–5.80)
RDW: 14.9 % (ref 11.0–15.0)
WBC: 4.9 10*3/uL (ref 3.8–10.8)

## 2015-12-14 LAB — BASIC METABOLIC PANEL
BUN: 16 mg/dL (ref 7–25)
CO2: 30 mmol/L (ref 20–31)
Calcium: 9.1 mg/dL (ref 8.6–10.3)
Chloride: 101 mmol/L (ref 98–110)
Creat: 1.41 mg/dL — ABNORMAL HIGH (ref 0.70–1.33)
Glucose, Bld: 270 mg/dL — ABNORMAL HIGH (ref 65–99)
Potassium: 4.1 mmol/L (ref 3.5–5.3)
Sodium: 142 mmol/L (ref 135–146)

## 2015-12-14 MED ORDER — FUROSEMIDE 40 MG PO TABS: ORAL_TABLET | ORAL | Status: DC

## 2015-12-14 MED ORDER — CARVEDILOL 6.25 MG PO TABS: 6.2500 mg | ORAL_TABLET | Freq: Two times a day (BID) | ORAL | Status: DC

## 2015-12-14 MED ORDER — SPIRONOLACTONE 25 MG PO TABS: 25.0000 mg | ORAL_TABLET | Freq: Every day | ORAL | Status: DC

## 2015-12-14 MED ORDER — ATORVASTATIN CALCIUM 40 MG PO TABS: 40.0000 mg | ORAL_TABLET | Freq: Every day | ORAL | Status: DC

## 2015-12-14 MED ORDER — CLOPIDOGREL BISULFATE 75 MG PO TABS: ORAL_TABLET | ORAL | Status: DC

## 2015-12-14 MED ORDER — RIVAROXABAN 20 MG PO TABS: 20.0000 mg | ORAL_TABLET | Freq: Every day | ORAL | Status: DC
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
## 2015-12-14 NOTE — Patient Instructions (Addendum)
Medication Instructions: - Your physician has recommended you make the following change in your medication:  1) Start Xarelto 20 mg one tablet by mouth once daily at bedtime.  Labwork: - Your physician recommends that you have lab work today: BMP/ CBC  Procedures/Testing: - none  Follow-Up: - Remote monitoring is used to monitor your Pacemaker of ICD from home. This monitoring reduces the number of office visits required to check your device to one time per year. It allows [place] to keep an eye on the functioning of your device to ensure it is working properly. You are scheduled for a device check from home on 03/14/16. You may send your transmission at any time that day. If you have a wireless device, the transmission will be sent automatically. After your physician reviews your transmission, you will receive a postcard with your next transmission date.   - Your physician wants you to follow-up in: 1 year with Dr. Caryl Comes. You will receive a reminder letter in the mail two months in advance. If you don't receive a letter, please call our office to schedule the follow-up appointment.   Any Additional Special Instructions Will Be Listed Below (If Applicable).     If you need a refill on your cardiac medications before your next appointment, please call your pharmacy.

## 2015-12-14 NOTE — Addendum Note (Signed)
Addended by: Alvis Lemmings C on: 12/14/2015 02:29 PM   Modules accepted: Orders

## 2015-12-14 NOTE — Patient Instructions (Addendum)
We will be checking the following labs today -    Medication Instructions:    Continue with your current medicines.     Testing/Procedures To Be Arranged:  N/A  Follow-Up:  Will determine after your doppler study.    Other Special Instructions:   N/A    If you need a refill on your cardiac medications before your next appointment, please call your pharmacy.   Call the [place] office at [phone] if you have any questions, problems or concerns.

## 2015-12-14 NOTE — Telephone Encounter (Signed)
S/w our [facility] location pt needed a stat doppler to r/o DVT.  Pt came in today for ov with left arm swelling and redness.  Pt has not had device checked since pt had device.  [facility] called after doppler test stated not a DVT was superficial and was in the basilic vein and it was thrombosed.  Truitt Merle, NP, stated pt has to have appointment today with Dr. Caryl Comes. Lorenda Hatchet from scheduling gave pt appointment today, at 2:00. Pt was called but did not receive two messages that were sent.  Pt showed up after [facility] and stated to go to lunch and come back at 2:00pm.  Pt stated verbal understanding.

## 2015-12-21 ENCOUNTER — Telehealth: Payer: Self-pay | Admitting: Nurse Practitioner

## 2015-12-21 NOTE — Telephone Encounter (Signed)
called to inform pt that we placed samples up front, pt aware.
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
## 2015-12-23 ENCOUNTER — Ambulatory Visit: Payer: BLUE CROSS/BLUE SHIELD | Admitting: Family Medicine

## 2015-12-28 ENCOUNTER — Encounter: Payer: Self-pay | Admitting: Family Medicine

## 2016-01-13 ENCOUNTER — Other Ambulatory Visit: Payer: Self-pay | Admitting: *Deleted

## 2016-01-13 DIAGNOSIS — Z95810 Presence of automatic (implantable) cardiac defibrillator: Secondary | ICD-10-CM

## 2016-01-13 MED ORDER — RIVAROXABAN 20 MG PO TABS: 20.0000 mg | ORAL_TABLET | Freq: Every day | ORAL | Status: DC

## 2016-01-18 ENCOUNTER — Ambulatory Visit (INDEPENDENT_AMBULATORY_CARE_PROVIDER_SITE_OTHER): Payer: Self-pay | Admitting: Nurse Practitioner

## 2016-01-18 ENCOUNTER — Encounter: Payer: Self-pay | Admitting: Nurse Practitioner

## 2016-01-18 VITALS — BP 118/82 | HR 90 | Ht 70.0 in | Wt 227.1 lb

## 2016-01-18 DIAGNOSIS — I82409 Acute embolism and thrombosis of unspecified deep veins of unspecified lower extremity: Secondary | ICD-10-CM

## 2016-01-18 DIAGNOSIS — I5022 Chronic systolic (congestive) heart failure: Secondary | ICD-10-CM

## 2016-01-18 DIAGNOSIS — Z95810 Presence of automatic (implantable) cardiac defibrillator: Secondary | ICD-10-CM

## 2016-01-18 DIAGNOSIS — I255 Ischemic cardiomyopathy: Secondary | ICD-10-CM

## 2016-01-18 MED ORDER — CARVEDILOL 6.25 MG PO TABS: 12.5000 mg | ORAL_TABLET | Freq: Two times a day (BID) | ORAL | Status: DC

## 2016-01-18 NOTE — Patient Instructions (Addendum)
We will be checking the following labs today - NONE   Medication Instructions:    Continue with your current medicines.  BUT  I am increasing the Coreg back to 12.5 mg twice a day - you need to take 2 of your 6.24m tablets TWICE a day   Make sure you are NOT taking Plavix (Clopidogrel) - this has been stopped   Resume your Entresto  Resume your Xarelto    Testing/Procedures To Be Arranged:  N/A  Follow-Up:   See me in one month    Other Special Instructions:   N/A    If you need a refill on your cardiac medications before your next appointment, please call your pharmacy.   Call the [place] at [phone] you have any questions, problems or concerns.
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
## 2016-02-18 ENCOUNTER — Ambulatory Visit: Payer: Self-pay | Admitting: Nurse Practitioner
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
## 2016-03-01 ENCOUNTER — Encounter: Payer: Self-pay | Admitting: Nurse Practitioner

## 2016-03-01 ENCOUNTER — Ambulatory Visit (INDEPENDENT_AMBULATORY_CARE_PROVIDER_SITE_OTHER): Payer: Self-pay | Admitting: Nurse Practitioner

## 2016-03-01 VITALS — BP 130/80 | HR 103 | Ht 70.0 in | Wt 223.8 lb

## 2016-03-01 DIAGNOSIS — Z91199 Patient's noncompliance with other medical treatment and regimen due to unspecified reason: Secondary | ICD-10-CM

## 2016-03-01 DIAGNOSIS — I255 Ischemic cardiomyopathy: Secondary | ICD-10-CM

## 2016-03-01 DIAGNOSIS — I5022 Chronic systolic (congestive) heart failure: Secondary | ICD-10-CM

## 2016-03-01 DIAGNOSIS — Z9119 Patient's noncompliance with other medical treatment and regimen: Secondary | ICD-10-CM

## 2016-03-01 MED ORDER — CARVEDILOL 25 MG PO TABS: 25.0000 mg | ORAL_TABLET | Freq: Two times a day (BID) | ORAL | 3 refills | Status: DC

## 2016-03-01 NOTE — Patient Instructions (Addendum)
We will be checking the following labs today - BMET   Medication Instructions:    Continue with your current medicines. BUT  I am increasing the Coreg to 25 mg twice a day    Testing/Procedures To Be Arranged:  N/A  Follow-Up:   See me in about 2 to 3 weeks    Other Special Instructions:   Will see if we can get you samples of Entresto    If you need a refill on your cardiac medications before your next appointment, please call your pharmacy.   Call the [place] office at [phone] if you have any questions, problems or concerns.
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
## 2016-03-02 ENCOUNTER — Telehealth: Payer: Self-pay

## 2016-03-02 DIAGNOSIS — I10 Essential (primary) hypertension: Secondary | ICD-10-CM

## 2016-03-02 LAB — BASIC METABOLIC PANEL
BUN: 22 mg/dL (ref 7–25)
CO2: 26 mmol/L (ref 20–31)
Calcium: 9 mg/dL (ref 8.6–10.3)
Chloride: 97 mmol/L — ABNORMAL LOW (ref 98–110)
Creat: 1.53 mg/dL — ABNORMAL HIGH (ref 0.70–1.33)
Glucose, Bld: 343 mg/dL — ABNORMAL HIGH (ref 65–99)
Potassium: 4.3 mmol/L (ref 3.5–5.3)
Sodium: 134 mmol/L — ABNORMAL LOW (ref 135–146)

## 2016-03-02 MED ORDER — FUROSEMIDE 40 MG PO TABS: 40.0000 mg | ORAL_TABLET | Freq: Every day | ORAL | 3 refills | Status: DC

## 2016-03-02 NOTE — Telephone Encounter (Signed)
Informed patient of results and verbal understanding expressed.  Instructed patient to DECREASE LASIX to 40 mg daily. He understands he will have a BMET at his next [place]. He was grateful for call.

## 2016-03-02 NOTE — Telephone Encounter (Signed)
-----  Message from Burtis Junes, NP sent at 03/02/2016  7:14 AM EDT ----- Ok to report. Labs are stable but his kidney function is a little worse. Cut the Lasix back to just 40 mg a day.  Will plan on rechecking BMET on return OV with me.

## 2016-03-08 ENCOUNTER — Encounter: Payer: Self-pay | Admitting: Nurse Practitioner

## 2016-03-13 ENCOUNTER — Other Ambulatory Visit: Payer: Self-pay

## 2016-03-13 ENCOUNTER — Ambulatory Visit: Payer: Self-pay | Admitting: Nurse Practitioner

## 2016-03-13 NOTE — Progress Notes (Deleted)
CARDIOLOGY OFFICE NOTE  Date:  03/13/2016    [name] Date of Birth: 1960/12/28 Medical Record #[id number]  PCP:  Ann Held, DO  Cardiologist:  Servando Snare & ***    No chief complaint on file.   History of Present Illness: Vincent Chandler is a 55 y.o. male who presents today for a ***   Comes in today. Here with   Past Medical History:  Diagnosis Date  . Anginal pain ([facility])   . Anxiety   . Asthma   . Atrial fibrillation-postoperative 11/28/2012  . Cardiomyopathy    alcohol use related  . CHF (congestive heart failure) ([facility])   . Chronic back pain   . Coronary artery disease    drug eluting stent RCA 2005-EF 30%- s/p CABG x 4; 2/4 patent grafts with SVG-PLOM and SVG-RCA system totally occluded. There are collaterals from the LAD system to the RCA and the RCA is totally occluded proximally. There are no collaterals to the LCx territory. He then underwent successful PCI of the mid left circumflex artery with overlapping Synergy drug-eluting stents  . Depression   . Diabetes mellitus type II dx'd in the 1990's  . GERD (gastroesophageal reflux disease)   . H/O hiatal hernia   . History of hypogonadism   . Hyperlipidemia   . Hypertension   . Kidney stones   . Migraines    "used to"  . OSA on CPAP    "mask is broken; working on getting a new one" (01/15/2015)  . Pneumonia 3-4 times  . Urinary incontinence     Past Surgical History:  Procedure Laterality Date  . CARDIAC DEFIBRILLATOR PLACEMENT  01/15/2015  . CORONARY ANGIOPLASTY WITH STENT PLACEMENT  ~ 2003  . CORONARY ARTERY BYPASS GRAFT N/A 08/15/2012   Procedure: CORONARY ARTERY BYPASS GRAFTING (CABG);  Surgeon: Ivin Poot, MD;  Location: [facility];  Service: Open Heart Surgery;  Laterality: N/A;  Coronary Artery Bypass Grafting Times Four Using Left Internal Mammary Artery and Right Saphenous Leg Vein Harvested Endoscopically  . EP IMPLANTABLE DEVICE N/A 01/15/2015   Procedure: ICD Implant;  Surgeon:  Deboraha Sprang, MD;  Location: [place] CV LAB;  Service: Cardiovascular;  Laterality: N/A;  . LEFT HEART CATHETERIZATION WITH CORONARY ANGIOGRAM N/A 08/08/2012   Procedure: LEFT HEART CATHETERIZATION WITH CORONARY ANGIOGRAM;  Surgeon: Peter M Martinique, MD;  Location: [facility];  Service: Cardiovascular;  Laterality: N/A;  . LEFT HEART CATHETERIZATION WITH CORONARY/GRAFT ANGIOGRAM N/A 07/21/2014   Procedure: LEFT HEART CATHETERIZATION WITH Beatrix Fetters;  Surgeon: Larey Dresser, MD;  Location: [facility];  Service: Cardiovascular;  Laterality: N/A;  . PERCUTANEOUS CORONARY STENT INTERVENTION (PCI-S)  07/21/2014   Procedure: PERCUTANEOUS CORONARY STENT INTERVENTION (PCI-S);  Surgeon: Larey Dresser, MD;  Location: [facility] CATH [facility];  Service: Cardiovascular;;  . REFRACTIVE SURGERY Bilateral 1990's  . TONSILLECTOMY  ~ 1970     Medications: Current Outpatient Prescriptions  Medication Sig Dispense Refill  . aspirin EC 81 MG tablet Take 81 mg by mouth daily. Reported on 07/14/2015    . atorvastatin (LIPITOR) 40 MG tablet Take 1 tablet (40 mg total) by mouth at bedtime. 90 tablet 3  . BAYER MICROLET LANCETS lancets Use as instructed to check blood sugar once a day dx code E11.65 100 each 1  . budesonide-formoterol (SYMBICORT) 160-4.5 MCG/ACT inhaler Inhale 2 puffs into the lungs 2 (two) times daily as needed (wheezing).    . carvedilol (COREG) 25 MG tablet Take  1 tablet (25 mg total) by mouth 2 (two) times daily. 180 tablet 3  . fluticasone (FLONASE) 50 MCG/ACT nasal spray Place 1 spray into both nostrils 2 (two) times daily as needed for allergies or rhinitis.    . furosemide (LASIX) 40 MG tablet Take 1 tablet (40 mg total) by mouth daily. 90 tablet 3  . glimepiride (AMARYL) 2 MG tablet TAKE 1 TABLET (2 MG TOTAL) BY MOUTH DAILY BEFORE BREAKFAST. 30 tablet 2  . glucose blood (BAYER CONTOUR NEXT TEST) test strip Use as instructed to check blood sugar once a day dx code E11.65 50 each 3  .  ibuprofen (ADVIL,MOTRIN) 200 MG tablet Take 400 mg by mouth daily as needed for headache.    . nitroGLYCERIN (NITROSTAT) 0.4 MG SL tablet Place 1 tablet (0.4 mg total) under the tongue every 5 (five) minutes as needed for chest pain. 25 tablet 3  . rivaroxaban (XARELTO) 20 MG TABS tablet Take 1 tablet (20 mg total) by mouth daily with supper. 30 tablet 11  . sacubitril-valsartan (ENTRESTO) 24-26 MG Take 1 tablet by mouth 2 (two) times daily. 60 tablet 6  . SitaGLIPtin-MetFORMIN HCl 50-1000 MG TB24 Take 2 tablets by mouth daily. 180 tablet 1  . spironolactone (ALDACTONE) 25 MG tablet Take 1 tablet (25 mg total) by mouth daily. 90 tablet 3  . triamcinolone cream (KENALOG) 0.5 % Apply 1 application topically 3 (three) times daily. 30 g 0   No current facility-administered medications for this visit.     Allergies: No Known Allergies  Social History: The patient  reports that he has never smoked. His smokeless tobacco use includes Chew. He reports that he drinks about 3.6 oz of alcohol per week . He reports that he does not use drugs.   Family History: The patient's ***family history includes Diabetes in his father and mother; Hyperlipidemia in his father; Hypertension in his father and mother.   Review of Systems: Please see the history of present illness.   Otherwise, the review of systems is positive for {NONE DEFAULTED:18576::"none"}.   All other systems are reviewed and negative.   Physical Exam: VS:  There were no vitals taken for this visit. Marland Kitchen  BMI There is no height or weight on file to calculate BMI.  Wt Readings from Last 3 Encounters:  03/01/16 223 lb 12.8 oz (101.5 kg)  01/18/16 227 lb 1.9 oz (103 kg)  12/14/15 233 lb 12.8 oz (106.1 kg)    General: Pleasant. Well developed, well nourished and in no acute distress.   HEENT: Normal.  Neck: Supple, no JVD, carotid bruits, or masses noted.  Cardiac: ***Regular rate and rhythm. No murmurs, rubs, or gallops. No edema.    Respiratory:  Lungs are clear to auscultation bilaterally with normal work of breathing.  GI: Soft and nontender.  MS: No deformity or atrophy. Gait and ROM intact.  Skin: Warm and dry. Color is normal.  Neuro:  Strength and sensation are intact and no gross focal deficits noted.  Psych: Alert, appropriate and with normal affect.   LABORATORY DATA:  EKG:  EKG {ACTION; IS/IS LPF:[id number]} ordered today. This demonstrates ***.  Lab Results  Component Value Date   WBC 4.9 12/14/2015   HGB 14.9 12/14/2015   HCT 45.0 12/14/2015   PLT 204 12/14/2015   GLUCOSE 343 (H) 03/01/2016   CHOL 192 09/21/2015   TRIG 202.0 (H) 09/21/2015   HDL 30.90 (L) 09/21/2015   LDLDIRECT 134.0 09/21/2015   LDLCALC NOT CALC 07/14/2015  ALT 14 09/21/2015   AST 12 09/21/2015   NA 134 (L) 03/01/2016   K 4.3 03/01/2016   CL 97 (L) 03/01/2016   CREATININE 1.53 (H) 03/01/2016   BUN 22 03/01/2016   CO2 26 03/01/2016   TSH 3.43 09/21/2015   PSA 0.26 09/21/2015   INR 1.02 07/21/2014   HGBA1C 8.3 (H) 09/21/2015   MICROALBUR 1.9 09/21/2015    BNP (last 3 results)  Recent Labs  07/14/15 1547  BNP 78.8    ProBNP (last 3 results) No results for input(s): PROBNP in the last 8760 hours.   Other Studies Reviewed Today:   Assessment/Plan:   Current medicines are reviewed with the patient today.  The patient does not have concerns regarding medicines other than what has been noted above.  The following changes have been made:  See above.  Labs/ tests ordered today include:   No orders of the defined types were placed in this encounter.    Disposition:   FU with *** in {gen number 4-37:357897} {Days to years:10300}.   Patient is agreeable to this plan and will call if any problems develop in the interim.   Signed: Burtis Junes, RN, ANP-C 03/13/2016 7:11 AM  [facility] [address] [place], [place]  [address] Phone: [phone] Fax: [phone]

## 2016-03-14 ENCOUNTER — Ambulatory Visit (INDEPENDENT_AMBULATORY_CARE_PROVIDER_SITE_OTHER): Payer: Self-pay | Admitting: *Deleted

## 2016-03-14 ENCOUNTER — Telehealth: Payer: Self-pay | Admitting: Cardiology

## 2016-03-14 DIAGNOSIS — I255 Ischemic cardiomyopathy: Secondary | ICD-10-CM

## 2016-03-14 NOTE — Telephone Encounter (Signed)
Spoke with pt and reminded pt of remote transmission that is due today. Pt verbalized understanding.   

## 2016-03-15 NOTE — Progress Notes (Signed)
Remote ICD transmission.   

## 2016-03-16 ENCOUNTER — Encounter: Payer: Self-pay | Admitting: Cardiology

## 2016-03-22 ENCOUNTER — Encounter: Payer: Self-pay | Admitting: Nurse Practitioner

## 2016-03-22 ENCOUNTER — Ambulatory Visit (INDEPENDENT_AMBULATORY_CARE_PROVIDER_SITE_OTHER): Payer: Self-pay | Admitting: Nurse Practitioner

## 2016-03-22 VITALS — BP 120/70 | HR 89 | Ht 70.0 in | Wt 228.1 lb

## 2016-03-22 DIAGNOSIS — I82409 Acute embolism and thrombosis of unspecified deep veins of unspecified lower extremity: Secondary | ICD-10-CM

## 2016-03-22 DIAGNOSIS — Z95810 Presence of automatic (implantable) cardiac defibrillator: Secondary | ICD-10-CM

## 2016-03-22 DIAGNOSIS — I255 Ischemic cardiomyopathy: Secondary | ICD-10-CM

## 2016-03-22 DIAGNOSIS — I5022 Chronic systolic (congestive) heart failure: Secondary | ICD-10-CM
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
## 2016-03-22 NOTE — Patient Instructions (Addendum)
We will be checking the following labs today - BMET   Medication Instructions:    Continue with your current medicines. BUT  I am stopping Xarelto  I am putting you back on Plavix 75 mg to take one a day - this has been sent to your drug store.   We will give you samples of Entresto today - call me if you need more.     Testing/Procedures To Be Arranged:  N/A  Follow-Up:   See me in 3 months    Other Special Instructions:   N/A    If you need a refill on your cardiac medications before your next appointment, please call your pharmacy.   Call the [place] office at [phone] if you have any questions, problems or concerns.

## 2016-03-23 LAB — CUP PACEART REMOTE DEVICE CHECK
Battery Remaining Longevity: 133 mo
Battery Voltage: 3.02 V
Brady Statistic RV Percent Paced: 0.01 %
HIGH POWER IMPEDANCE MEASURED VALUE: 84 Ohm
Implantable Lead Implant Date: 20160715
Implantable Lead Serial Number: 382379
Lead Channel Impedance Value: 589 Ohm
Lead Channel Pacing Threshold Pulse Width: 0.4 ms
Lead Channel Sensing Intrinsic Amplitude: 7.25 mV
Lead Channel Sensing Intrinsic Amplitude: 7.25 mV
Lead Channel Setting Pacing Amplitude: 2.5 V
Lead Channel Setting Pacing Pulse Width: 0.4 ms
MDC IDC LEAD LOCATION: 753862
MDC IDC LEAD MODEL: 293
MDC IDC MSMT LEADCHNL RV IMPEDANCE VALUE: 570 Ohm
MDC IDC MSMT LEADCHNL RV PACING THRESHOLD AMPLITUDE: 0.75 V
MDC IDC SESS DTM: 20170913012156
MDC IDC SET LEADCHNL RV SENSING SENSITIVITY: 0.3 mV

## 2016-03-23 LAB — BASIC METABOLIC PANEL
BUN: 27 mg/dL — ABNORMAL HIGH (ref 7–25)
CO2: 28 mmol/L (ref 20–31)
Calcium: 9 mg/dL (ref 8.6–10.3)
Chloride: 98 mmol/L (ref 98–110)
Creat: 1.63 mg/dL — ABNORMAL HIGH (ref 0.70–1.33)
Glucose, Bld: 350 mg/dL — ABNORMAL HIGH (ref 65–99)
Potassium: 4 mmol/L (ref 3.5–5.3)
Sodium: 136 mmol/L (ref 135–146)

## 2016-03-30 ENCOUNTER — Encounter: Payer: Self-pay | Admitting: Cardiology

## 2016-06-13 ENCOUNTER — Ambulatory Visit (INDEPENDENT_AMBULATORY_CARE_PROVIDER_SITE_OTHER): Payer: Self-pay | Admitting: *Deleted

## 2016-06-13 DIAGNOSIS — I255 Ischemic cardiomyopathy: Secondary | ICD-10-CM

## 2016-06-13 NOTE — Progress Notes (Signed)
Remote ICD transmission.   

## 2016-06-14 ENCOUNTER — Ambulatory Visit: Payer: Self-pay | Admitting: Nurse Practitioner

## 2016-06-21 ENCOUNTER — Encounter: Payer: Self-pay | Admitting: Cardiology

## 2016-06-30 LAB — CUP PACEART REMOTE DEVICE CHECK
Date Time Interrogation Session: 20171212083523
HighPow Impedance: 96 Ohm
Implantable Lead Implant Date: 20160715
Implantable Lead Location: 753862
Implantable Lead Model: 293
Implantable Lead Serial Number: 382379
Implantable Pulse Generator Implant Date: 20160715
Lead Channel Impedance Value: 627 Ohm
Lead Channel Pacing Threshold Amplitude: 0.875 V
Lead Channel Pacing Threshold Pulse Width: 0.4 ms
Lead Channel Setting Pacing Pulse Width: 0.4 ms
Lead Channel Setting Sensing Sensitivity: 0.3 mV
MDC IDC MSMT BATTERY REMAINING LONGEVITY: 131 mo
MDC IDC MSMT BATTERY VOLTAGE: 3.02 V
MDC IDC MSMT LEADCHNL RV IMPEDANCE VALUE: 627 Ohm
MDC IDC MSMT LEADCHNL RV SENSING INTR AMPL: 15.5 mV
MDC IDC MSMT LEADCHNL RV SENSING INTR AMPL: 15.5 mV
MDC IDC SET LEADCHNL RV PACING AMPLITUDE: 2.5 V
MDC IDC STAT BRADY RV PERCENT PACED: 0 %

## 2016-07-07 ENCOUNTER — Encounter: Payer: Self-pay | Admitting: Cardiology

## 2016-07-12 ENCOUNTER — Ambulatory Visit (INDEPENDENT_AMBULATORY_CARE_PROVIDER_SITE_OTHER): Payer: Self-pay | Admitting: Nurse Practitioner

## 2016-07-12 ENCOUNTER — Encounter: Payer: Self-pay | Admitting: Nurse Practitioner

## 2016-07-12 ENCOUNTER — Telehealth: Payer: Self-pay | Admitting: *Deleted

## 2016-07-12 VITALS — BP 150/84 | HR 80 | Ht 70.0 in | Wt 227.1 lb

## 2016-07-12 DIAGNOSIS — I5022 Chronic systolic (congestive) heart failure: Secondary | ICD-10-CM

## 2016-07-12 DIAGNOSIS — Z95810 Presence of automatic (implantable) cardiac defibrillator: Secondary | ICD-10-CM

## 2016-07-12 DIAGNOSIS — I255 Ischemic cardiomyopathy: Secondary | ICD-10-CM

## 2016-07-12 MED ORDER — CLOPIDOGREL BISULFATE 75 MG PO TABS: 75.0000 mg | ORAL_TABLET | Freq: Every day | ORAL | 3 refills | Status: DC

## 2016-07-12 MED ORDER — SACUBITRIL-VALSARTAN 24-26 MG PO TABS: 1.0000 | ORAL_TABLET | Freq: Two times a day (BID) | ORAL | 6 refills | Status: DC

## 2016-07-12 MED ORDER — FUROSEMIDE 40 MG PO TABS: 80.0000 mg | ORAL_TABLET | Freq: Every day | ORAL | 3 refills | Status: DC

## 2016-07-12 MED ORDER — SPIRONOLACTONE 25 MG PO TABS
25.0000 mg | ORAL_TABLET | Freq: Every day | ORAL | 3 refills | Status: DC
Start: 2016-07-12 — End: 2017-08-09

## 2016-07-12 MED ORDER — SITAGLIP PHOS-METFORMIN HCL ER 50-1000 MG PO TB24: 2.0000 | ORAL_TABLET | Freq: Every day | ORAL | 11 refills | Status: DC

## 2016-07-12 NOTE — Telephone Encounter (Signed)
One box of entresto samples provided to patient today at his office visit.

## 2016-07-12 NOTE — Progress Notes (Signed)
CARDIOLOGY OFFICE NOTE  Date:  07/12/2016    Vincent Chandler Date of Birth: 1961-04-02 Medical Record #[id number]  PCP:  Ann Held, DO  Cardiologist:  Ree Shay    Chief Complaint  Patient presents with  . Cardiomyopathy    Follow up visit - seen for Dr. Caryl Comes    History of Present Illness: Vincent Chandler is a 56 y.o. male who presents today for a 4 month check. Seen for Dr. Caryl Comes.   Usually follows with me.   He has a known history of CAD s/p 4 vessel CABG back in 2014, hypertension, diabetes mellitus, obesity, OSA, hyperlipidemia, post cabg afib, anxiety, kidney stones, on CPAP, asthma, pneumonia x 3 and a history of noncompliance.   He presented mid January of 2016 with acute respiratory distress and VT. He apparently had run out of several of his medications and was not restricting his salt intake. He developed respiratory distress at home and went to his PCP where due to his hypoxia on the pulse ox in 80s he was sent to [facility] ED for evaluation. There he was found to have VT with 45 beats which he was not aware of and thus was transferred to [facility] for further care. He was diuresed. Troponin was elevated at 0.07. Echo revealed that his EF had dropped further to 20-25%. G3DD. Severe diffuse hypokinesis. He underwent left heart cath revealing 2/4 patent grafts with SVG-PLOM and SVG-RCA system totally occluded. There are collaterals from the LAD system to the RCA and the RCA is totally occluded proximally. There are no collaterals to the LCx territory. He then underwent successful PCI of the mid left circumflex artery with overlapping Synergy drug-eluting stents (See cath reports below). He was placed on plavix. P2Y12 showed the plavix was working appropriately. Repeat echo showed no improvement in his EF after maximizing his medicines - he was referred to Dr. Caryl Comes for ICD implant.   He has had multiple issues since this time - his medicines are never quite  right or he has ran out or not taking as prescribed. He has been noncompliant with his follow up with me and EP. Has had a DVT in the right arm treated with Xarelto for 3 months and then put back on Plavix. His dose of Coreg has been cut and then increased. Has been without insurance.   Last seen back in September - finally got him back on max dose of Coreg. Had hoped to put him on Corlanor but there is no way he can afford and he is to noncompliant for samples. Still without insurance.   Comes back today. Here alone. Not feeling well. Seems more short of breath. Little chest tightness. He has ran out of Entresto and Spironolactone and his Metformin. Still has no insurance. Just started a new business. Complaining of fullness in his belly. Bloated. Little short of breath. Weight is stable. He does not know what medicines he is taking in any way shape or form. The Xarelto is back on his med list - probably not taking his Plavix. Has not seen primary care. No insurance still but "he is working on it".   Past Medical History:  Diagnosis Date  . Anginal pain ([place])   . Anxiety   . Asthma   . Atrial fibrillation-postoperative 11/28/2012  . Cardiomyopathy    alcohol use related  . CHF (congestive heart failure) ([place])   . Chronic back pain   . Coronary artery disease  drug eluting stent RCA 2005-EF 30%- s/p CABG x 4; 2/4 patent grafts with SVG-PLOM and SVG-RCA system totally occluded. There are collaterals from the LAD system to the RCA and the RCA is totally occluded proximally. There are no collaterals to the LCx territory. He then underwent successful PCI of the mid left circumflex artery with overlapping Synergy drug-eluting stents  . Depression   . Diabetes mellitus type II dx'd in the 1990's  . GERD (gastroesophageal reflux disease)   . H/O hiatal hernia   . History of hypogonadism   . Hyperlipidemia   . Hypertension   . Kidney stones   . Migraines    "used to"  . OSA on CPAP    "mask  is broken; working on getting a new one" (01/15/2015)  . Pneumonia 3-4 times  . Urinary incontinence     Past Surgical History:  Procedure Laterality Date  . CARDIAC DEFIBRILLATOR PLACEMENT  01/15/2015  . CORONARY ANGIOPLASTY WITH STENT PLACEMENT  ~ 2003  . CORONARY ARTERY BYPASS GRAFT N/A 08/15/2012   Procedure: CORONARY ARTERY BYPASS GRAFTING (CABG);  Surgeon: Ivin Poot, MD;  Location: [place];  Service: Open Heart Surgery;  Laterality: N/A;  Coronary Artery Bypass Grafting Times Four Using Left Internal Mammary Artery and Right Saphenous Leg Vein Harvested Endoscopically  . EP IMPLANTABLE DEVICE N/A 01/15/2015   Procedure: ICD Implant;  Surgeon: Deboraha Sprang, MD;  Location: [place] CV LAB;  Service: Cardiovascular;  Laterality: N/A;  . LEFT HEART CATHETERIZATION WITH CORONARY ANGIOGRAM N/A 08/08/2012   Procedure: LEFT HEART CATHETERIZATION WITH CORONARY ANGIOGRAM;  Surgeon: Peter M Martinique, MD;  Location: [facility] CATH LAB;  Service: Cardiovascular;  Laterality: N/A;  . LEFT HEART CATHETERIZATION WITH CORONARY/GRAFT ANGIOGRAM N/A 07/21/2014   Procedure: LEFT HEART CATHETERIZATION WITH Beatrix Fetters;  Surgeon: Larey Dresser, MD;  Location: [facility] CATH LAB;  Service: Cardiovascular;  Laterality: N/A;  . PERCUTANEOUS CORONARY STENT INTERVENTION (PCI-S)  07/21/2014   Procedure: PERCUTANEOUS CORONARY STENT INTERVENTION (PCI-S);  Surgeon: Larey Dresser, MD;  Location: [facility] CATH LAB;  Service: Cardiovascular;;  . REFRACTIVE SURGERY Bilateral 1990's  . TONSILLECTOMY  ~ 1970     Medications: Current Outpatient Prescriptions  Medication Sig Dispense Refill  . aspirin EC 81 MG tablet Take 81 mg by mouth daily. Reported on 07/14/2015    . atorvastatin (LIPITOR) 40 MG tablet Take 1 tablet (40 mg total) by mouth at bedtime. 90 tablet 3  . BAYER MICROLET LANCETS lancets Use as instructed to check blood sugar once a day dx code E11.65 100 each 1  . budesonide-formoterol (SYMBICORT) 160-4.5  MCG/ACT inhaler Inhale 2 puffs into the lungs 2 (two) times daily as needed (wheezing).    . carvedilol (COREG) 25 MG tablet Take 1 tablet (25 mg total) by mouth 2 (two) times daily. 180 tablet 3  . furosemide (LASIX) 40 MG tablet Take 2 tablets (80 mg total) by mouth daily. 90 tablet 3  . glimepiride (AMARYL) 2 MG tablet TAKE 1 TABLET (2 MG TOTAL) BY MOUTH DAILY BEFORE BREAKFAST. 30 tablet 2  . glucose blood (BAYER CONTOUR NEXT TEST) test strip Use as instructed to check blood sugar once a day dx code E11.65 50 each 3  . nitroGLYCERIN (NITROSTAT) 0.4 MG SL tablet Place 1 tablet (0.4 mg total) under the tongue every 5 (five) minutes as needed for chest pain. 25 tablet 3  . sacubitril-valsartan (ENTRESTO) 24-26 MG Take 1 tablet by mouth 2 (two) times daily. 60 tablet 6  .  SitaGLIPtin-MetFORMIN HCl 50-1000 MG TB24 Take 2 tablets by mouth daily. 60 tablet 11  . spironolactone (ALDACTONE) 25 MG tablet Take 1 tablet (25 mg total) by mouth daily. 90 tablet 3  . clopidogrel (PLAVIX) 75 MG tablet Take 1 tablet (75 mg total) by mouth daily. 90 tablet 3   No current facility-administered medications for this visit.     Allergies: No Known Allergies  Social History: The patient  reports that he has never smoked. His smokeless tobacco use includes Chew. He reports that he drinks about 3.6 oz of alcohol per week . He reports that he does not use drugs.   Family History: The patient's family history includes Diabetes in his father and mother; Hyperlipidemia in his father; Hypertension in his father and mother.   Review of Systems: Please see the history of present illness.   Otherwise, the review of systems is positive for none.   All other systems are reviewed and negative.   Physical Exam: VS:  BP (!) 150/84   Pulse 80   Ht _0  (1.778 m)   Wt 227 lb 1.9 oz (103 kg)   BMI 32.59 kg/m  .  BMI Body mass index is 32.59 kg/m.  Wt Readings from Last 3 Encounters:  07/12/16 227 lb 1.9 oz (103 kg)    03/22/16 228 lb 1.9 oz (103.5 kg)  03/01/16 223 lb 12.8 oz (101.5 kg)    General: Pleasant.He is alert and in no acute distress.   HEENT: Normal.  Neck: Supple, no JVD, carotid bruits, or masses noted.  Cardiac: Regular rate and rhythm. Probably has an S3. No edema.  Respiratory:  Lungs are clear to auscultation bilaterally with normal work of breathing.  GI: Bloated MS: No deformity or atrophy. Gait and ROM intact.  Skin: Warm and dry. Color is normal.  Neuro:  Strength and sensation are intact and no gross focal deficits noted.  Psych: Alert, appropriate and with normal affect.   LABORATORY DATA:  EKG:  EKG is ordered today. This shows NSR, prior inferior and anterior infarct, lateral T wave changes - unchanged.   Lab Results  Component Value Date   WBC 4.9 12/14/2015   HGB 14.9 12/14/2015   HCT 45.0 12/14/2015   PLT 204 12/14/2015   GLUCOSE 350 (H) 03/22/2016   CHOL 192 09/21/2015   TRIG 202.0 (H) 09/21/2015   HDL 30.90 (L) 09/21/2015   LDLDIRECT 134.0 09/21/2015   LDLCALC NOT CALC 07/14/2015   ALT 14 09/21/2015   AST 12 09/21/2015   NA 136 03/22/2016   K 4.0 03/22/2016   CL 98 03/22/2016   CREATININE 1.63 (H) 03/22/2016   BUN 27 (H) 03/22/2016   CO2 28 03/22/2016   TSH 3.43 09/21/2015   PSA 0.26 09/21/2015   INR 1.02 07/21/2014   HGBA1C 8.3 (H) 09/21/2015   MICROALBUR 1.9 09/21/2015    BNP (last 3 results)  Recent Labs  07/14/15 1547  BNP 78.8    ProBNP (last 3 results) No results for input(s): PROBNP in the last 8760 hours.   Other Studies Reviewed Today:  Cardiac Catheterization Procedure Note from 07/2014  Coronary angiography:  Left mainstem: Minimal disease.   Left anterior descending (LAD): 80% mid LAD at D2. Small-moderate D2 with 80% ostial stenosis. Large D1 with long 80% proximal stenosis. Patent LIMA-LAD. Patent SVG-large D1.   Left circumflex (LCx): 80% proximal stenosis proximal to the take-off of a large PLOM.  There is a long up to 99% stenosed area  in the proximal PLOM. Total occlusion of SVG-PLOM.   Right coronary artery (RCA): Total occlusion of the proximal RCA. There are left to right collaterals that appear to primarily come from the LAD system. Total occlusion of SVG-RCA system.   Left ventriculography: Not done, elevated LVEDP and recent echo.   Final Conclusions: 2/4 patent grafts with SVG-PLOM and SVG-RCA system totally occluded. There are collaterals from the LAD system to the RCA and the RCA is totally occluded proximally. There are no collaterals to the LCx territory. Given significant fall in LV systolic function, will plan PCI to LCx and PLOM. Dr Irish Lack to perform.   Recommendations: After PCI, will need repeat echo in 3 months to assess for ICD.   With elevated LV EDP, will restart IV Lasix.   Loralie Champagne 07/21/2014, 12:28 PM  PROCEDURE: PCI Left circumflex  INDICATIONS: Acute systolic heart failure, elevated troponin, bypass graft closure, NSTEMI  The risks, benefits, and details of the procedure were explained to the patient. The patient verbalized understanding and wanted to proceed. Informed written consent was obtained.  PROCEDURE TECHNIQUE: The diagnostic catheterization was performed by Dr. [name]. This revealed severe disease in the native circumflex, up to 99% in the mid circumflex. There was a significant lesion more proximal as well. The segment with the 99% stenosis was a long segment of diffuse disease. Angiomax was used for anticoagulation. ACT was used to check that the Angiomax was therapeutic. A fielder XT wire was placed across the area of severe disease in the mid circumflex. A 2.0 x 20 balloon was used to predilate. A 2.25 x 38 Synergy stent was then deployed across the more distal area disease. A 2.5 x 16 Synergy stent was deployed in overlapping fashion across the more proximal area disease. The distal stent was postdilated with a  2.5 x 12 noncompliant balloon. The more proximal area was postdilated with a 3.0 x 12 noncompliant balloon to high pressure. Several doses of nitroglycerin were administered intracoronary. The patient tolerated the procedure well. There was an excellent angiographic appearance. A vascade closure device was deployed for hemostasis.   CONTRAST: Total of 95 cc.  COMPLICATIONS: None.    IMPRESSIONS:  1. Successful PCI of the mid left circumflex artery with overlapping Synergy drug-eluting stents as noted above.   RECOMMENDATION: Continue dual antiplatelet therapy for at least a year. He will be watched overnight. We'll continue Angiomax since he only got the Plavix while he was on the cath table. Watch for any groin bleeding. Continue diuresis per Dr. [name].     Echo Study Conclusions 10/20/2014 - Left ventricle: The cavity size was mildly dilated. Wall thickness was increased in a pattern of mild LVH. Systolic function was severely reduced. The estimated ejection fraction was in the range of 25% to 30%. Diffuse hypokinesis. There is akinesis of the inferolateral and inferior myocardium. Features are consistent with a pseudonormal left ventricular filling pattern, with concomitant abnormal relaxation and increased filling pressure (grade 2 diastolic dysfunction). - Mitral valve: There was mild regurgitation. - Left atrium: The atrium was mildly dilated. - Right ventricle: Systolic function was mildly reduced.  Impressions:  - Technically difficult; definity used; global hypokinesis with inferior and inferior lateral akinesis; overall severely reduced LV function; grade 2 diastolic dysfunction; mild LAE; mildly reduced RV function; mild MR; trace TR.    Assessment/Plan:  1. Systolic heart failure - EF has fallen back to 20% - it remains in this range based on last echo. He has ICD  in place. Compliance remains his primary issue and  unfortunately, he is suffering because of this.  Dr. Caryl Comes has wanted to consider placing him on Corlanor to get his HR down. Unfortunately, I can't even get him on his baseline medicines consistently. He is not sure of his medicines again today. We have gone over this several times today. He needs to come back next week with all his medicines, the bottles and let us go over them again. I have sent in the refills today. He needs to follow the list given to him today. Labs today. I would like to get an echo - he is not able to afford - may consider echo and consider having in [place] but for now, he can't even afford that cost now.  I don't think Corlanor at this point is going to work for him  2. Prior DVT - treated with 3 months of Xarelto. This keeps coming back up on his list - not clear what he is taking.   3. Ischemic CM - EF remains low. Would like to get the echo repeated.   4. CAD - with failure of 2 grafts - s/p PCI to the LCX from January of 2016 - he is to be back on DAPT. He is not even taking aspirin but I have advised him.   5. VT - ICD in place  6. Noncompliance - Unfortunately, I do not see this changing. It just seems to be a constant issue.   7. Diabetes - uncontrolled - needs to get back to PCP but no insurance. I went on and refilled his meds today.   Current medicines are reviewed with the patient today.  The patient does not have concerns regarding medicines other than what has been noted above.  The following changes have been made:  See above.  Labs/ tests ordered today include:    Orders Placed This Encounter  Procedures  . Basic metabolic panel  . CBC  . Pro b natriuretic peptide (BNP)  . EKG 12-Lead     Disposition:   FU with me next week.    Patient is agreeable to this plan and will call if any problems develop in the interim.   Signed: Burtis Junes, RN, ANP-C 07/12/2016 3:56 PM  [facility] [address] [place], [place]  [address] Phone: [phone] Fax: [phone]

## 2016-07-12 NOTE — Patient Instructions (Addendum)
We will be checking the following labs today - Pro BNP, BMET, CBC   Medication Instructions:    Continue with your current medicines. See the other med sheet we gave you  BRING ALL YOUR MEDICINES NEXT WEEK - EVERYTHING    Testing/Procedures To Be Arranged:  N/A  Follow-Up:   See me next week    Other Special Instructions:   N/A    If you need a refill on your cardiac medications before your next appointment, please call your pharmacy.   Call the [place] office at [phone] if you have any questions, problems or concerns.

## 2016-07-13 LAB — BASIC METABOLIC PANEL
BUN/Creatinine Ratio: 19 (ref 9–20)
BUN: 26 mg/dL — ABNORMAL HIGH (ref 6–24)
CO2: 21 mmol/L (ref 18–29)
Calcium: 9.2 mg/dL (ref 8.7–10.2)
Chloride: 98 mmol/L (ref 96–106)
Creatinine, Ser: 1.4 mg/dL — ABNORMAL HIGH (ref 0.76–1.27)
GFR calc Af Amer: 65 mL/min/{1.73_m2} (ref >59)
GFR calc non Af Amer: 57 mL/min/{1.73_m2} — ABNORMAL LOW (ref >59)
Glucose: 298 mg/dL — ABNORMAL HIGH (ref 65–99)
Potassium: 4.4 mmol/L (ref 3.5–5.2)
Sodium: 138 mmol/L (ref 134–144)

## 2016-07-13 LAB — CBC
Hematocrit: 46 % (ref 37.5–51.0)
Hemoglobin: 15 g/dL (ref 13.0–17.7)
MCH: 35 pg — ABNORMAL HIGH (ref 26.6–33.0)
MCHC: 32.6 g/dL (ref 31.5–35.7)
MCV: 108 fL — ABNORMAL HIGH (ref 79–97)
Platelets: 176 10*3/uL (ref 150–379)
RBC: 4.28 x10E6/uL (ref 4.14–5.80)
RDW: 15.1 % (ref 12.3–15.4)
WBC: 5.3 10*3/uL (ref 3.4–10.8)

## 2016-07-13 LAB — PRO B NATRIURETIC PEPTIDE: NT-Pro BNP: 731 pg/mL — ABNORMAL HIGH (ref 0–121)

## 2016-07-17 ENCOUNTER — Ambulatory Visit: Payer: Self-pay | Admitting: Nurse Practitioner

## 2016-07-17 NOTE — Progress Notes (Deleted)
CARDIOLOGY OFFICE NOTE  Date:  07/17/2016    [name] Date of Birth: 05/01/1961 Medical Record #[id number]  PCP:  Ann Held, DO  Cardiologist:  Ree Shay    No chief complaint on file.   History of Present Illness: Vincent Chandler is a 56 y.o. male who presents today for a follow up visit. Seen for Dr. Caryl Comes.   Usually follows with me.   He has a known history of CAD s/p 4 vessel CABG back in 2014, hypertension, diabetes mellitus, obesity, OSA, hyperlipidemia, post cabg afib, anxiety, kidney stones, on CPAP, asthma, pneumonia x 3 and a history of noncompliance.   He presented mid January of 2016 with acute respiratory distress and VT. He apparently had run out of several of his medications and was not restricting his salt intake. He developed respiratory distress at home and went to his PCP where due to his hypoxia on the pulse ox in 80s he was sent to [facility] ED for evaluation. There he was found to have VT with 45 beats which he was not aware of and thus was transferred to [facility] for further care. He was diuresed. Troponin was elevated at 0.07. Echo revealed that his EF had dropped further to 20-25%. G3DD. Severe diffuse hypokinesis. He underwent left heart cath revealing 2/4 patent grafts with SVG-PLOM and SVG-RCA system totally occluded. There are collaterals from the LAD system to the RCA and the RCA is totally occluded proximally. There are no collaterals to the LCx territory. He then underwent successful PCI of the mid left circumflex artery with overlapping Synergy drug-eluting stents (See cath reports below). He was placed on plavix. P2Y12 showed the plavix was working appropriately. Repeat echo showed no improvement in his EF after maximizing his medicines - he was referred to Dr. Caryl Comes for ICD implant.   He has had multiple issues since this time - his medicines are never quite right or he has ran out or not taking as prescribed. He has been  noncompliant with his follow up with me and EP. Has had a DVT in the right arm treated with Xarelto for 3 months and then put back on Plavix. His dose of Coreg has been cut and then increased. Has been without insurance.   Last seen back in September - finally got him back on max dose of Coreg. Had hoped to put him on Corlanor but there is no way he can afford and he is to noncompliant for samples. Still without insurance.   I saw him last week - not feeling well. Off some of his medicines again and his med list was incorrect again. No insurance still. Was to be brought back for medication verification - he was to bring all of his bottles.   Comes back today. Here alone.   Past Medical History:  Diagnosis Date  . Anginal pain ([place])   . Anxiety   . Asthma   . Atrial fibrillation-postoperative 11/28/2012  . Cardiomyopathy    alcohol use related  . CHF (congestive heart failure) ([place])   . Chronic back pain   . Coronary artery disease    drug eluting stent RCA 2005-EF 30%- s/p CABG x 4; 2/4 patent grafts with SVG-PLOM and SVG-RCA system totally occluded. There are collaterals from the LAD system to the RCA and the RCA is totally occluded proximally. There are no collaterals to the LCx territory. He then underwent successful PCI of the mid left circumflex artery with overlapping  Synergy drug-eluting stents  . Depression   . Diabetes mellitus type II dx'd in the 1990's  . GERD (gastroesophageal reflux disease)   . H/O hiatal hernia   . History of hypogonadism   . Hyperlipidemia   . Hypertension   . Kidney stones   . Migraines    "used to"  . OSA on CPAP    "mask is broken; working on getting a new one" (01/15/2015)  . Pneumonia 3-4 times  . Urinary incontinence     Past Surgical History:  Procedure Laterality Date  . CARDIAC DEFIBRILLATOR PLACEMENT  01/15/2015  . CORONARY ANGIOPLASTY WITH STENT PLACEMENT  ~ 2003  . CORONARY ARTERY BYPASS GRAFT N/A 08/15/2012   Procedure: CORONARY  ARTERY BYPASS GRAFTING (CABG);  Surgeon: Ivin Poot, MD;  Location: [facility];  Service: Open Heart Surgery;  Laterality: N/A;  Coronary Artery Bypass Grafting Times Four Using Left Internal Mammary Artery and Right Saphenous Leg Vein Harvested Endoscopically  . EP IMPLANTABLE DEVICE N/A 01/15/2015   Procedure: ICD Implant;  Surgeon: Deboraha Sprang, MD;  Location: [facility];  Service: Cardiovascular;  Laterality: N/A;  . LEFT HEART CATHETERIZATION WITH CORONARY ANGIOGRAM N/A 08/08/2012   Procedure: LEFT HEART CATHETERIZATION WITH CORONARY ANGIOGRAM;  Surgeon: Peter M Martinique, MD;  Location: [name] CATH LAB;  Service: Cardiovascular;  Laterality: N/A;  . LEFT HEART CATHETERIZATION WITH CORONARY/GRAFT ANGIOGRAM N/A 07/21/2014   Procedure: LEFT HEART CATHETERIZATION WITH Beatrix Fetters;  Surgeon: Larey Dresser, MD;  Location: [facility] CATH LAB;  Service: Cardiovascular;  Laterality: N/A;  . PERCUTANEOUS CORONARY STENT INTERVENTION (PCI-S)  07/21/2014   Procedure: PERCUTANEOUS CORONARY STENT INTERVENTION (PCI-S);  Surgeon: Larey Dresser, MD;  Location: [facility] CATH LAB;  Service: Cardiovascular;;  . REFRACTIVE SURGERY Bilateral 1990's  . TONSILLECTOMY  ~ 1970     Medications: Current Outpatient Prescriptions  Medication Sig Dispense Refill  . aspirin EC 81 MG tablet Take 81 mg by mouth daily. Reported on 07/14/2015    . atorvastatin (LIPITOR) 40 MG tablet Take 1 tablet (40 mg total) by mouth at bedtime. 90 tablet 3  . BAYER MICROLET LANCETS lancets Use as instructed to check blood sugar once a day dx code E11.65 100 each 1  . budesonide-formoterol (SYMBICORT) 160-4.5 MCG/ACT inhaler Inhale 2 puffs into the lungs 2 (two) times daily as needed (wheezing).    . carvedilol (COREG) 25 MG tablet Take 1 tablet (25 mg total) by mouth 2 (two) times daily. 180 tablet 3  . clopidogrel (PLAVIX) 75 MG tablet Take 1 tablet (75 mg total) by mouth daily. 90 tablet 3  . furosemide (LASIX) 40 MG tablet Take 2  tablets (80 mg total) by mouth daily. 90 tablet 3  . glimepiride (AMARYL) 2 MG tablet TAKE 1 TABLET (2 MG TOTAL) BY MOUTH DAILY BEFORE BREAKFAST. 30 tablet 2  . glucose blood (BAYER CONTOUR NEXT TEST) test strip Use as instructed to check blood sugar once a day dx code E11.65 50 each 3  . nitroGLYCERIN (NITROSTAT) 0.4 MG SL tablet Place 1 tablet (0.4 mg total) under the tongue every 5 (five) minutes as needed for chest pain. 25 tablet 3  . sacubitril-valsartan (ENTRESTO) 24-26 MG Take 1 tablet by mouth 2 (two) times daily. 60 tablet 6  . SitaGLIPtin-MetFORMIN HCl 50-1000 MG TB24 Take 2 tablets by mouth daily. 60 tablet 11  . spironolactone (ALDACTONE) 25 MG tablet Take 1 tablet (25 mg total) by mouth daily. 90 tablet 3   No current  facility-administered medications for this visit.     Allergies: No Known Allergies  Social History: The patient  reports that he has never smoked. His smokeless tobacco use includes Chew. He reports that he drinks about 3.6 oz of alcohol per week . He reports that he does not use drugs.   Family History: The patient's family history includes Diabetes in his father and mother; Hyperlipidemia in his father; Hypertension in his father and mother.   Review of Systems: Please see the history of present illness.   Otherwise, the review of systems is positive for none.   All other systems are reviewed and negative.   Physical Exam: VS:  There were no vitals taken for this visit. Marland Kitchen  BMI There is no height or weight on file to calculate BMI.  Wt Readings from Last 3 Encounters:  07/12/16 227 lb 1.9 oz (103 kg)  03/22/16 228 lb 1.9 oz (103.5 kg)  03/01/16 223 lb 12.8 oz (101.5 kg)    General: Pleasant. Well developed, well nourished and in no acute distress.   HEENT: Normal.  Neck: Supple, no JVD, carotid bruits, or masses noted.  Cardiac: Regular rate and rhythm. No murmurs, rubs, or gallops. No edema.  Respiratory:  Lungs are clear to auscultation bilaterally  with normal work of breathing.  GI: Soft and nontender.  MS: No deformity or atrophy. Gait and ROM intact.  Skin: Warm and dry. Color is normal.  Neuro:  Strength and sensation are intact and no gross focal deficits noted.  Psych: Alert, appropriate and with normal affect.   LABORATORY DATA:  EKG:  EKG is not ordered today.  Lab Results  Component Value Date   WBC 5.3 07/12/2016   HGB 14.9 12/14/2015   HCT 46.0 07/12/2016   PLT 176 07/12/2016   GLUCOSE 298 (H) 07/12/2016   CHOL 192 09/21/2015   TRIG 202.0 (H) 09/21/2015   HDL 30.90 (L) 09/21/2015   LDLDIRECT 134.0 09/21/2015   LDLCALC NOT CALC 07/14/2015   ALT 14 09/21/2015   AST 12 09/21/2015   NA 138 07/12/2016   K 4.4 07/12/2016   CL 98 07/12/2016   CREATININE 1.40 (H) 07/12/2016   BUN 26 (H) 07/12/2016   CO2 21 07/12/2016   TSH 3.43 09/21/2015   PSA 0.26 09/21/2015   INR 1.02 07/21/2014   HGBA1C 8.3 (H) 09/21/2015   MICROALBUR 1.9 09/21/2015    BNP (last 3 results) No results for input(s): BNP in the last 8760 hours.  ProBNP (last 3 results)  Recent Labs  07/12/16 1603  PROBNP 731*     Other Studies Reviewed Today:   Assessment/Plan: Cardiac Catheterization Procedure Note from 07/2014  Coronary angiography:  Left mainstem: Minimal disease.   Left anterior descending (LAD): 80% mid LAD at D2. Small-moderate D2 with 80% ostial stenosis. Large D1 with long 80% proximal stenosis. Patent LIMA-LAD. Patent SVG-large D1.   Left circumflex (LCx): 80% proximal stenosis proximal to the take-off of a large PLOM. There is a long up to 99% stenosed area in the proximal PLOM. Total occlusion of SVG-PLOM.   Right coronary artery (RCA): Total occlusion of the proximal RCA. There are left to right collaterals that appear to primarily come from the LAD system. Total occlusion of SVG-RCA system.   Left ventriculography: Not done, elevated LVEDP and recent echo.   Final Conclusions: 2/4  patent grafts with SVG-PLOM and SVG-RCA system totally occluded. There are collaterals from the LAD system to the RCA and the RCA is  totally occluded proximally. There are no collaterals to the LCx territory. Given significant fall in LV systolic function, will plan PCI to LCx and PLOM. Dr Irish Lack to perform.   Recommendations: After PCI, will need repeat echo in 3 months to assess for ICD.   With elevated LV EDP, will restart IV Lasix.   Loralie Champagne 07/21/2014, 12:28 PM  PROCEDURE: PCI Left circumflex  INDICATIONS: Acute systolic heart failure, elevated troponin, bypass graft closure, NSTEMI  The risks, benefits, and details of the procedure were explained to the patient. The patient verbalized understanding and wanted to proceed. Informed written consent was obtained.  PROCEDURE TECHNIQUE: The diagnostic catheterization was performed by Dr. [name]. This revealed severe disease in the native circumflex, up to 99% in the mid circumflex. There was a significant lesion more proximal as well. The segment with the 99% stenosis was a long segment of diffuse disease. Angiomax was used for anticoagulation. ACT was used to check that the Angiomax was therapeutic. A fielder XT wire was placed across the area of severe disease in the mid circumflex. A 2.0 x 20 balloon was used to predilate. A 2.25 x 38 Synergy stent was then deployed across the more distal area disease. A 2.5 x 16 Synergy stent was deployed in overlapping fashion across the more proximal area disease. The distal stent was postdilated with a 2.5 x 12 noncompliant balloon. The more proximal area was postdilated with a 3.0 x 12 noncompliant balloon to high pressure. Several doses of nitroglycerin were administered intracoronary. The patient tolerated the procedure well. There was an excellent angiographic appearance. A vascade closure device was deployed for hemostasis.   CONTRAST: Total of 95 cc.  COMPLICATIONS:  None.    IMPRESSIONS:  1. Successful PCI of the mid left circumflex artery with overlapping Synergy drug-eluting stents as noted above.   RECOMMENDATION: Continue dual antiplatelet therapy for at least a year. He will be watched overnight. We'll continue Angiomax since he only got the Plavix while he was on the cath table. Watch for any groin bleeding. Continue diuresis per Dr. [name].     Echo Study Conclusions 10/20/2014 - Left ventricle: The cavity size was mildly dilated. Wall thickness was increased in a pattern of mild LVH. Systolic function was severely reduced. The estimated ejection fraction was in the range of 25% to 30%. Diffuse hypokinesis. There is akinesis of the inferolateral and inferior myocardium. Features are consistent with a pseudonormal left ventricular filling pattern, with concomitant abnormal relaxation and increased filling pressure (grade 2 diastolic dysfunction). - Mitral valve: There was mild regurgitation. - Left atrium: The atrium was mildly dilated. - Right ventricle: Systolic function was mildly reduced.  Impressions:  - Technically difficult; definity used; global hypokinesis with inferior and inferior lateral akinesis; overall severely reduced LV function; grade 2 diastolic dysfunction; mild LAE; mildly reduced RV function; mild MR; trace TR.    Assessment/Plan:  1. Systolic heart failure - EF has fallen back to 20% - it remains in this range based on last echo. He has ICD in place. Compliance remains his primary issue and unfortunately, he is suffering because of this.  Dr. Caryl Comes has wanted to consider placing him on Corlanor to get his HR down. Unfortunately, I can't even get him on his baseline medicines consistently. He is not sure of his medicines again today. We have gone over this several times today. He needs to come back next week with all his medicines, the bottles and let us go over them again.  I have  sent in the refills today. He needs to follow the list given to him today. Labs today. I would like to get an echo - he is not able to afford - may consider echo and consider having in [place] but for now, he can't even afford that cost now.  I don't think Corlanor at this point is going to work for him  2. Prior DVT - treated with 3 months of Xarelto. This keeps coming back up on his list - not clear what he is taking.   3. Ischemic CM - EF remains low. Would like to get the echo repeated.   4. CAD - with failure of 2 grafts - s/p PCI to the LCX from January of 2016 - he is to be back on DAPT. He is not even taking aspirin but I have advised him.   5. VT - ICD in place  6. Noncompliance - Unfortunately, I do not see this changing. It just seems to be a constant issue.   7. Diabetes - uncontrolled - needs to get back to PCP but no insurance. I went on and refilled his meds today.   Current medicines are reviewed with the patient today.  The patient does not have concerns regarding medicines other than what has been noted above.  The following changes have been made:  See above.  Labs/ tests ordered today include:   No orders of the defined types were placed in this encounter.    Disposition:   FU with *** in {gen number [id number]} {Days to years:10300}.   Patient is agreeable to this plan and will call if any problems develop in the interim.   Signed: Burtis Junes, RN, ANP-C 07/17/2016 1:02 PM  [facility] [address] [place], [place]  [address] Phone: [phone] Fax: [phone]

## 2016-07-24 ENCOUNTER — Encounter: Payer: Self-pay | Admitting: Nurse Practitioner

## 2016-09-12 ENCOUNTER — Ambulatory Visit (INDEPENDENT_AMBULATORY_CARE_PROVIDER_SITE_OTHER): Payer: Self-pay | Admitting: *Deleted

## 2016-09-12 DIAGNOSIS — I255 Ischemic cardiomyopathy: Secondary | ICD-10-CM

## 2016-09-12 NOTE — Progress Notes (Signed)
Remote ICD transmission.   

## 2016-09-13 ENCOUNTER — Encounter: Payer: Self-pay | Admitting: Cardiology

## 2016-09-13 LAB — CUP PACEART REMOTE DEVICE CHECK
Battery Voltage: 3.01 V
HighPow Impedance: 87 Ohm
Implantable Lead Implant Date: 20160715
Implantable Lead Location: 753862
Implantable Lead Model: 293
Implantable Pulse Generator Implant Date: 20160715
Lead Channel Impedance Value: 532 Ohm
Lead Channel Pacing Threshold Pulse Width: 0.4 ms
Lead Channel Sensing Intrinsic Amplitude: 14.125 mV
Lead Channel Setting Pacing Amplitude: 2.5 V
Lead Channel Setting Sensing Sensitivity: 0.3 mV
MDC IDC LEAD SERIAL: 382379
MDC IDC MSMT BATTERY REMAINING LONGEVITY: 129 mo
MDC IDC MSMT LEADCHNL RV IMPEDANCE VALUE: 589 Ohm
MDC IDC MSMT LEADCHNL RV PACING THRESHOLD AMPLITUDE: 0.875 V
MDC IDC MSMT LEADCHNL RV SENSING INTR AMPL: 14.125 mV
MDC IDC SESS DTM: 20180313041604
MDC IDC SET LEADCHNL RV PACING PULSEWIDTH: 0.4 ms
MDC IDC STAT BRADY RV PERCENT PACED: 0 %

## 2016-09-28 ENCOUNTER — Encounter: Payer: Self-pay | Admitting: Cardiology

## 2017-06-15 ENCOUNTER — Ambulatory Visit (INDEPENDENT_AMBULATORY_CARE_PROVIDER_SITE_OTHER): Payer: Self-pay | Admitting: *Deleted

## 2017-06-15 DIAGNOSIS — I255 Ischemic cardiomyopathy: Secondary | ICD-10-CM

## 2017-06-20 ENCOUNTER — Encounter: Payer: Self-pay | Admitting: Cardiology

## 2017-06-20 NOTE — Progress Notes (Signed)
Remote ICD transmission.

## 2017-06-27 LAB — CUP PACEART REMOTE DEVICE CHECK
Date Time Interrogation Session: 20181215023454
HIGH POWER IMPEDANCE MEASURED VALUE: 78 Ohm
Implantable Lead Implant Date: 20160715
Implantable Lead Serial Number: 382379
Implantable Pulse Generator Implant Date: 20160715
Lead Channel Impedance Value: 532 Ohm
Lead Channel Pacing Threshold Amplitude: 0.75 V
Lead Channel Pacing Threshold Pulse Width: 0.4 ms
Lead Channel Sensing Intrinsic Amplitude: 12.875 mV
Lead Channel Sensing Intrinsic Amplitude: 12.875 mV
MDC IDC LEAD LOCATION: 753862
MDC IDC MSMT BATTERY REMAINING LONGEVITY: 123 mo
MDC IDC MSMT BATTERY VOLTAGE: 3.01 V
MDC IDC MSMT LEADCHNL RV IMPEDANCE VALUE: 532 Ohm
MDC IDC SET LEADCHNL RV PACING AMPLITUDE: 2.5 V
MDC IDC SET LEADCHNL RV PACING PULSEWIDTH: 0.4 ms
MDC IDC SET LEADCHNL RV SENSING SENSITIVITY: 0.3 mV
MDC IDC STAT BRADY RV PERCENT PACED: 0.01 %

## 2017-08-07 ENCOUNTER — Inpatient Hospital Stay (HOSPITAL_BASED_OUTPATIENT_CLINIC_OR_DEPARTMENT_OTHER)
Admission: EM | Admit: 2017-08-07 | Discharge: 2017-08-17 | DRG: 291 | Disposition: A | Discharge status: Home / Self Care | Payer: Medicare Other | Attending: Cardiology | Admitting: Cardiology

## 2017-08-07 ENCOUNTER — Encounter: Payer: Self-pay | Admitting: Emergency Medicine

## 2017-08-07 ENCOUNTER — Emergency Department (HOSPITAL_COMMUNITY)
Admission: EM | Admit: 2017-08-07 | Discharge: 2017-08-07 | Discharge status: Against Medical Advice | Payer: Medicare Other

## 2017-08-07 ENCOUNTER — Encounter (HOSPITAL_BASED_OUTPATIENT_CLINIC_OR_DEPARTMENT_OTHER): Payer: Self-pay | Admitting: *Deleted

## 2017-08-07 ENCOUNTER — Emergency Department (HOSPITAL_BASED_OUTPATIENT_CLINIC_OR_DEPARTMENT_OTHER): Payer: Medicare Other

## 2017-08-07 ENCOUNTER — Ambulatory Visit (INDEPENDENT_AMBULATORY_CARE_PROVIDER_SITE_OTHER): Payer: Medicare Other | Admitting: Emergency Medicine

## 2017-08-07 ENCOUNTER — Ambulatory Visit (INDEPENDENT_AMBULATORY_CARE_PROVIDER_SITE_OTHER): Payer: Medicare Other

## 2017-08-07 ENCOUNTER — Other Ambulatory Visit: Payer: Self-pay

## 2017-08-07 VITALS — BP 110/76 | HR 133 | Temp 98.1°F | Resp 16 | Ht 69.0 in | Wt 217.4 lb

## 2017-08-07 DIAGNOSIS — F17220 Nicotine dependence, chewing tobacco, uncomplicated: Secondary | ICD-10-CM | POA: Diagnosis present

## 2017-08-07 DIAGNOSIS — Z452 Encounter for adjustment and management of vascular access device: Secondary | ICD-10-CM | POA: Diagnosis not present

## 2017-08-07 DIAGNOSIS — Y92009 Unspecified place in unspecified non-institutional (private) residence as the place of occurrence of the external cause: Secondary | ICD-10-CM | POA: Diagnosis not present

## 2017-08-07 DIAGNOSIS — Z7984 Long term (current) use of oral hypoglycemic drugs: Secondary | ICD-10-CM

## 2017-08-07 DIAGNOSIS — I5043 Acute on chronic combined systolic (congestive) and diastolic (congestive) heart failure: Secondary | ICD-10-CM | POA: Diagnosis not present

## 2017-08-07 DIAGNOSIS — Z955 Presence of coronary angioplasty implant and graft: Secondary | ICD-10-CM

## 2017-08-07 DIAGNOSIS — Z95810 Presence of automatic (implantable) cardiac defibrillator: Secondary | ICD-10-CM | POA: Diagnosis not present

## 2017-08-07 DIAGNOSIS — N179 Acute kidney failure, unspecified: Secondary | ICD-10-CM | POA: Diagnosis not present

## 2017-08-07 DIAGNOSIS — E1122 Type 2 diabetes mellitus with diabetic chronic kidney disease: Secondary | ICD-10-CM | POA: Diagnosis present

## 2017-08-07 DIAGNOSIS — I25810 Atherosclerosis of coronary artery bypass graft(s) without angina pectoris: Secondary | ICD-10-CM | POA: Diagnosis present

## 2017-08-07 DIAGNOSIS — N183 Chronic kidney disease, stage 3 (moderate): Secondary | ICD-10-CM | POA: Diagnosis present

## 2017-08-07 DIAGNOSIS — R7989 Other specified abnormal findings of blood chemistry: Secondary | ICD-10-CM

## 2017-08-07 DIAGNOSIS — I4891 Unspecified atrial fibrillation: Secondary | ICD-10-CM | POA: Diagnosis not present

## 2017-08-07 DIAGNOSIS — I484 Atypical atrial flutter: Secondary | ICD-10-CM | POA: Diagnosis present

## 2017-08-07 DIAGNOSIS — J22 Unspecified acute lower respiratory infection: Secondary | ICD-10-CM

## 2017-08-07 DIAGNOSIS — Z9119 Patient's noncompliance with other medical treatment and regimen: Secondary | ICD-10-CM | POA: Diagnosis not present

## 2017-08-07 DIAGNOSIS — Z86718 Personal history of other venous thrombosis and embolism: Secondary | ICD-10-CM | POA: Diagnosis not present

## 2017-08-07 DIAGNOSIS — J849 Interstitial pulmonary disease, unspecified: Secondary | ICD-10-CM | POA: Diagnosis not present

## 2017-08-07 DIAGNOSIS — T50906A Underdosing of unspecified drugs, medicaments and biological substances, initial encounter: Secondary | ICD-10-CM | POA: Diagnosis present

## 2017-08-07 DIAGNOSIS — Z7902 Long term (current) use of antithrombotics/antiplatelets: Secondary | ICD-10-CM

## 2017-08-07 DIAGNOSIS — I255 Ischemic cardiomyopathy: Secondary | ICD-10-CM | POA: Diagnosis present

## 2017-08-07 DIAGNOSIS — Z23 Encounter for immunization: Secondary | ICD-10-CM

## 2017-08-07 DIAGNOSIS — Z7982 Long term (current) use of aspirin: Secondary | ICD-10-CM

## 2017-08-07 DIAGNOSIS — I130 Hypertensive heart and chronic kidney disease with heart failure and stage 1 through stage 4 chronic kidney disease, or unspecified chronic kidney disease: Principal | ICD-10-CM | POA: Diagnosis present

## 2017-08-07 DIAGNOSIS — R0602 Shortness of breath: Secondary | ICD-10-CM

## 2017-08-07 DIAGNOSIS — I483 Typical atrial flutter: Secondary | ICD-10-CM | POA: Diagnosis not present

## 2017-08-07 DIAGNOSIS — I4892 Unspecified atrial flutter: Secondary | ICD-10-CM | POA: Diagnosis not present

## 2017-08-07 DIAGNOSIS — R0902 Hypoxemia: Secondary | ICD-10-CM | POA: Diagnosis not present

## 2017-08-07 DIAGNOSIS — R739 Hyperglycemia, unspecified: Secondary | ICD-10-CM

## 2017-08-07 DIAGNOSIS — E1165 Type 2 diabetes mellitus with hyperglycemia: Secondary | ICD-10-CM

## 2017-08-07 DIAGNOSIS — Z951 Presence of aortocoronary bypass graft: Secondary | ICD-10-CM | POA: Diagnosis not present

## 2017-08-07 DIAGNOSIS — I509 Heart failure, unspecified: Secondary | ICD-10-CM | POA: Diagnosis not present

## 2017-08-07 DIAGNOSIS — I5023 Acute on chronic systolic (congestive) heart failure: Secondary | ICD-10-CM | POA: Diagnosis present

## 2017-08-07 DIAGNOSIS — Z8249 Family history of ischemic heart disease and other diseases of the circulatory system: Secondary | ICD-10-CM

## 2017-08-07 DIAGNOSIS — I252 Old myocardial infarction: Secondary | ICD-10-CM

## 2017-08-07 DIAGNOSIS — G4733 Obstructive sleep apnea (adult) (pediatric): Secondary | ICD-10-CM | POA: Diagnosis present

## 2017-08-07 DIAGNOSIS — R0600 Dyspnea, unspecified: Secondary | ICD-10-CM | POA: Diagnosis not present

## 2017-08-07 DIAGNOSIS — Z833 Family history of diabetes mellitus: Secondary | ICD-10-CM

## 2017-08-07 DIAGNOSIS — R000 Tachycardia, unspecified: Secondary | ICD-10-CM | POA: Diagnosis not present

## 2017-08-07 DIAGNOSIS — Z1211 Encounter for screening for malignant neoplasm of colon: Secondary | ICD-10-CM

## 2017-08-07 DIAGNOSIS — I5022 Chronic systolic (congestive) heart failure: Secondary | ICD-10-CM | POA: Diagnosis not present

## 2017-08-07 DIAGNOSIS — K029 Dental caries, unspecified: Secondary | ICD-10-CM | POA: Diagnosis not present

## 2017-08-07 DIAGNOSIS — R778 Other specified abnormalities of plasma proteins: Secondary | ICD-10-CM

## 2017-08-07 DIAGNOSIS — I340 Nonrheumatic mitral (valve) insufficiency: Secondary | ICD-10-CM | POA: Diagnosis not present

## 2017-08-07 DIAGNOSIS — J9811 Atelectasis: Secondary | ICD-10-CM | POA: Diagnosis not present

## 2017-08-07 DIAGNOSIS — I5021 Acute systolic (congestive) heart failure: Secondary | ICD-10-CM | POA: Diagnosis not present

## 2017-08-07 DIAGNOSIS — R05 Cough: Secondary | ICD-10-CM | POA: Diagnosis not present

## 2017-08-07 DIAGNOSIS — Z91120 Patient's intentional underdosing of medication regimen due to financial hardship: Secondary | ICD-10-CM

## 2017-08-07 DIAGNOSIS — I110 Hypertensive heart disease with heart failure: Secondary | ICD-10-CM | POA: Diagnosis not present

## 2017-08-07 DIAGNOSIS — I248 Other forms of acute ischemic heart disease: Secondary | ICD-10-CM | POA: Diagnosis not present

## 2017-08-07 DIAGNOSIS — I700 Atherosclerosis of aorta: Secondary | ICD-10-CM | POA: Diagnosis present

## 2017-08-07 DIAGNOSIS — N200 Calculus of kidney: Secondary | ICD-10-CM | POA: Diagnosis not present

## 2017-08-07 DIAGNOSIS — IMO0001 Reserved for inherently not codable concepts without codable children: Secondary | ICD-10-CM

## 2017-08-07 LAB — COMPREHENSIVE METABOLIC PANEL
ALK PHOS: 78 U/L (ref 38–126)
ALT: 24 U/L (ref 17–63)
AST: 32 U/L (ref 15–41)
Albumin: 3.7 g/dL (ref 3.5–5.0)
Anion gap: 11 (ref 5–15)
BILIRUBIN TOTAL: 0.9 mg/dL (ref 0.3–1.2)
BUN: 22 mg/dL — AB (ref 6–20)
CALCIUM: 9.3 mg/dL (ref 8.9–10.3)
CO2: 24 mmol/L (ref 22–32)
CREATININE: 1.41 mg/dL — AB (ref 0.61–1.24)
Chloride: 101 mmol/L (ref 101–111)
GFR, EST NON AFRICAN AMERICAN: 54 mL/min — AB (ref >60)
Glucose, Bld: 276 mg/dL — ABNORMAL HIGH (ref 65–99)
Potassium: 3.8 mmol/L (ref 3.5–5.1)
Sodium: 136 mmol/L (ref 135–145)
Total Protein: 7.6 g/dL (ref 6.5–8.1)

## 2017-08-07 LAB — MAGNESIUM: MAGNESIUM: 1.6 mg/dL — AB (ref 1.7–2.4)

## 2017-08-07 LAB — POCT CBC
GRANULOCYTE PERCENT: 65.2 % (ref 37–80)
HCT, POC: 45.2 % (ref 43.5–53.7)
Hemoglobin: 15.1 g/dL (ref 14.1–18.1)
Lymph, poc: 1.4 (ref 0.6–3.4)
MCH, POC: 36.4 pg — AB (ref 27–31.2)
MCHC: 33.4 g/dL (ref 31.8–35.4)
MCV: 109 fL — AB (ref 80–97)
MID (CBC): 0.5 (ref 0–0.9)
MPV: 7.3 fL (ref 0–99.8)
PLATELET COUNT, POC: 165 10*3/uL (ref 142–424)
POC Granulocyte: 3.7 (ref 2–6.9)
POC LYMPH %: 25.3 % (ref 10–50)
POC MID %: 9.5 %M (ref 0–12)
RBC: 4.14 M/uL — AB (ref 4.69–6.13)
RDW, POC: 17.4 %
WBC: 5.7 10*3/uL (ref 4.6–10.2)

## 2017-08-07 LAB — URINALYSIS, ROUTINE W REFLEX MICROSCOPIC
Bilirubin Urine: NEGATIVE
GLUCOSE, UA: 100 mg/dL — AB
HGB URINE DIPSTICK: NEGATIVE
KETONES UR: NEGATIVE mg/dL
Leukocytes, UA: NEGATIVE
Nitrite: NEGATIVE
PH: 5.5 (ref 5.0–8.0)
Protein, ur: NEGATIVE mg/dL
Specific Gravity, Urine: 1.015 (ref 1.005–1.030)

## 2017-08-07 LAB — CBC
HEMATOCRIT: 45.1 % (ref 39.0–52.0)
Hemoglobin: 15.3 g/dL (ref 13.0–17.0)
MCH: 37.5 pg — AB (ref 26.0–34.0)
MCHC: 33.9 g/dL (ref 30.0–36.0)
MCV: 110.5 fL — ABNORMAL HIGH (ref 78.0–100.0)
PLATELETS: 164 10*3/uL (ref 150–400)
RBC: 4.08 MIL/uL — ABNORMAL LOW (ref 4.22–5.81)
RDW: 14.8 % (ref 11.5–15.5)
WBC: 5.7 10*3/uL (ref 4.0–10.5)

## 2017-08-07 LAB — TROPONIN I
Troponin I: 0.05 ng/mL (ref <0.03)
Troponin I: 0.07 ng/mL (ref <0.03)

## 2017-08-07 LAB — PROTIME-INR
INR: 1.06
Prothrombin Time: 13.7 seconds (ref 11.4–15.2)

## 2017-08-07 LAB — GLUCOSE, POCT (MANUAL RESULT ENTRY): POC Glucose: 299 mg/dl — AB (ref 70–99)

## 2017-08-07 LAB — BRAIN NATRIURETIC PEPTIDE: B NATRIURETIC PEPTIDE 5: 423.7 pg/mL — AB (ref 0.0–100.0)

## 2017-08-07 MED ORDER — HEPARIN BOLUS VIA INFUSION
4000.0000 [IU] | Freq: Once | INTRAVENOUS | Status: AC
Start: 2017-08-07 — End: 2017-08-07
  Administered 2017-08-07: 4000 [IU] via INTRAVENOUS

## 2017-08-07 MED ORDER — FUROSEMIDE 10 MG/ML IJ SOLN
40.0000 mg | Freq: Once | INTRAMUSCULAR | Status: AC
  Administered 2017-08-07: 40 mg via INTRAVENOUS
  Filled 2017-08-07: qty 4

## 2017-08-07 MED ORDER — ALBUTEROL SULFATE (2.5 MG/3ML) 0.083% IN NEBU
2.5000 mg | INHALATION_SOLUTION | Freq: Once | RESPIRATORY_TRACT | Status: AC
  Administered 2017-08-07: 2.5 mg via RESPIRATORY_TRACT

## 2017-08-07 MED ORDER — IPRATROPIUM BROMIDE 0.02 % IN SOLN
0.5000 mg | Freq: Once | RESPIRATORY_TRACT | Status: AC
  Administered 2017-08-07: 0.5 mg via RESPIRATORY_TRACT

## 2017-08-07 MED ORDER — HEPARIN (PORCINE) IN NACL 100-0.45 UNIT/ML-% IJ SOLN
1150.0000 [IU]/h | INTRAMUSCULAR | Status: DC
  Administered 2017-08-07: 1150 [IU]/h via INTRAVENOUS
  Filled 2017-08-07: qty 250

## 2017-08-07 MED ORDER — MAGNESIUM SULFATE 2 GM/50ML IV SOLN
2.0000 g | Freq: Once | INTRAVENOUS | Status: AC
  Administered 2017-08-07: 2 g via INTRAVENOUS
  Filled 2017-08-07: qty 50

## 2017-08-07 NOTE — ED Notes (Signed)
Date and time results received: 08/07/17 1923   Test: troponin Critical Value: 0.05  Name of Provider Notified: Dr. Sherry Ruffing  Orders Received? Or Actions Taken?: no new orders at this time

## 2017-08-07 NOTE — ED Notes (Signed)
Patient up to NF at 1522 saying he is leaving.

## 2017-08-07 NOTE — ED Triage Notes (Signed)
Pt reports that he has been SOB x 1 week. Productive cough. Tachycardia. Denies CP. Pt pale in triage.

## 2017-08-07 NOTE — Progress Notes (Signed)
Vincent Chandler 57 y.o.   Chief Complaint  Patient presents with  . Nasal Congestion    X 1-2 WEEKS per patient he has ASTHMA  . Shortness of Breath    HISTORY OF PRESENT ILLNESS: This is a 57 y.o. male complaining of difficulty breathing progressively getting worse over the past 2 weeks.  Denies chest pain or pressure however he is diabetic.  Complaining of nasal congestion, states he has a history of asthma and has been intermittently wheezing.  Review of medical records show that he has a history of nonischemic and ischemic cardiomyopathy with congestive heart failure.  States that he has not taken his diabetes medications for over a month.  Has an implantable defibrillator.  Denies any recent activity from it.  Denies any fever or chills.  Concerned that he may have pneumonia.  Denies any nausea or vomiting.  Denies abdominal pain.  Denies syncope.  HPI   Prior to Admission medications   Medication Sig Start Date End Date Taking? Authorizing Provider  aspirin EC 81 MG tablet Take 81 mg by mouth daily. Reported on 07/14/2015 11/28/12  Yes Deboraha Sprang, MD  atorvastatin (LIPITOR) 40 MG tablet Take 1 tablet (40 mg total) by mouth at bedtime. 12/14/15  Yes Burtis Junes, NP  clopidogrel (PLAVIX) 75 MG tablet Take 1 tablet (75 mg total) by mouth daily. 07/12/16  Yes Burtis Junes, NP  furosemide (LASIX) 40 MG tablet Take 2 tablets (80 mg total) by mouth daily. 07/12/16  Yes Burtis Junes, NP  nitroGLYCERIN (NITROSTAT) 0.4 MG SL tablet Place 1 tablet (0.4 mg total) under the tongue every 5 (five) minutes as needed for chest pain. 07/14/15  Yes Burtis Junes, NP  sacubitril-valsartan (ENTRESTO) 24-26 MG Take 1 tablet by mouth 2 (two) times daily. 07/12/16  Yes Burtis Junes, NP  SitaGLIPtin-MetFORMIN HCl 50-1000 MG TB24 Take 2 tablets by mouth daily. 07/12/16  Yes Burtis Junes, NP  spironolactone (ALDACTONE) 25 MG tablet Take 1 tablet (25 mg total) by mouth daily. 07/12/16  Yes  Burtis Junes, NP  BAYER MICROLET LANCETS lancets Use as instructed to check blood sugar once a day dx code E11.65 11/12/14   Elayne Snare, MD  budesonide-formoterol [facility]) 160-4.5 MCG/ACT inhaler Inhale 2 puffs into the lungs 2 (two) times daily as needed (wheezing).    [provider]  carvedilol (COREG) 25 MG tablet Take 1 tablet (25 mg total) by mouth 2 (two) times daily. 03/01/16 07/12/16  Burtis Junes, NP  glimepiride (AMARYL) 2 MG tablet TAKE 1 TABLET (2 MG TOTAL) BY MOUTH DAILY BEFORE BREAKFAST. Patient not taking: Reported on 08/07/2017 10/22/15   [name], Alferd Apa, DO  glucose blood (BAYER CONTOUR NEXT TEST) test strip Use as instructed to check blood sugar once a day dx code E11.65 11/12/14   Elayne Snare, MD    No Known Allergies  Patient Active Problem List   Diagnosis Date Noted  . CKD (chronic kidney disease) 2-3 07/19/2014  . NSTEMI (non-ST elevated myocardial infarction) ([place]) 07/18/2014  . OSA (obstructive sleep apnea) 07/18/2014  . V tach ([place]) 07/17/2014  . Hypoxemia 07/17/2014  . CHF (congestive heart failure) ([place]) 06/04/2012  . Sinus tachycardia 12/29/2010  . Coronary artery disease prior RCA stent with new inferior Q waves 10/18/2010  . Ischemic and nonischemic cardiomyopathy   10/18/2010  . Diabetes mellitus type II, uncontrolled ([place]) 03/10/2010  . Hyperlipidemia 03/10/2010  . Essential hypertension 03/10/2010    Past  Medical History:  Diagnosis Date  . Anginal pain ([place])   . Anxiety   . Asthma   . Atrial fibrillation-postoperative 11/28/2012  . Cardiomyopathy    alcohol use related  . CHF (congestive heart failure) ([place])   . Chronic back pain   . Coronary artery disease    drug eluting stent RCA 2005-EF 30%- s/p CABG x 4; 2/4 patent grafts with SVG-PLOM and SVG-RCA system totally occluded. There are collaterals from the LAD system to the RCA and the RCA is totally occluded proximally. There are no collaterals to the LCx territory. He  then underwent successful PCI of the mid left circumflex artery with overlapping Synergy drug-eluting stents  . Depression   . Diabetes mellitus type II dx'd in the 1990's  . GERD (gastroesophageal reflux disease)   . H/O hiatal hernia   . History of hypogonadism   . Hyperlipidemia   . Hypertension   . Kidney stones   . Migraines    "used to"  . OSA on CPAP    "mask is broken; working on getting a new one" (01/15/2015)  . Pneumonia 3-4 times  . Urinary incontinence     Past Surgical History:  Procedure Laterality Date  . CARDIAC DEFIBRILLATOR PLACEMENT  01/15/2015  . CORONARY ANGIOPLASTY WITH STENT PLACEMENT  ~ 2003  . CORONARY ARTERY BYPASS GRAFT N/A 08/15/2012   Procedure: CORONARY ARTERY BYPASS GRAFTING (CABG);  Surgeon: Ivin Poot, MD;  Location: [facility];  Service: Open Heart Surgery;  Laterality: N/A;  Coronary Artery Bypass Grafting Times Four Using Left Internal Mammary Artery and Right Saphenous Leg Vein Harvested Endoscopically  . EP IMPLANTABLE DEVICE N/A 01/15/2015   Procedure: ICD Implant;  Surgeon: Deboraha Sprang, MD;  Location: [facility] CV LAB;  Service: Cardiovascular;  Laterality: N/A;  . LEFT HEART CATHETERIZATION WITH CORONARY ANGIOGRAM N/A 08/08/2012   Procedure: LEFT HEART CATHETERIZATION WITH CORONARY ANGIOGRAM;  Surgeon: Peter M Martinique, MD;  Location: [facility] CATH LAB;  Service: Cardiovascular;  Laterality: N/A;  . LEFT HEART CATHETERIZATION WITH CORONARY/GRAFT ANGIOGRAM N/A 07/21/2014   Procedure: LEFT HEART CATHETERIZATION WITH Beatrix Fetters;  Surgeon: Larey Dresser, MD;  Location: [facility] CATH LAB;  Service: Cardiovascular;  Laterality: N/A;  . PERCUTANEOUS CORONARY STENT INTERVENTION (PCI-S)  07/21/2014   Procedure: PERCUTANEOUS CORONARY STENT INTERVENTION (PCI-S);  Surgeon: Larey Dresser, MD;  Location: [facility] CATH LAB;  Service: Cardiovascular;;  . REFRACTIVE SURGERY Bilateral 1990's  . TONSILLECTOMY  ~ 1970    Social History   Socioeconomic History  .  Marital status: Divorced    Spouse name: n/a  . Number of children: 0  . Years of education: 12th grade  . Highest education level: Not on file  Social Needs  . Financial resource strain: Not on file  . Food insecurity - worry: Not on file  . Food insecurity - inability: Not on file  . Transportation needs - medical: Not on file  . Transportation needs - non-medical: Not on file  Occupational History  . Occupation: Data processing manager ---self employed    Employer: [employer]  Tobacco Use  . Smoking status: Never Smoker  . Smokeless tobacco: Current User    Types: Chew  Substance and Sexual Activity  . Alcohol use: Yes    Alcohol/week: 3.6 oz    Types: 6 Shots of liquor per week  . Drug use: No  . Sexual activity: Yes    Partners: Female  Other Topics Concern  . Not on  file  Social History Narrative   Exercise-- walking    Lives alone.   Brother lives in [place], [place]    Family History  Problem Relation Age of Onset  . Hypertension Mother   . Diabetes Mother   . Hypertension Father   . Diabetes Father   . Hyperlipidemia Father      Review of Systems  Constitutional: Positive for malaise/fatigue. Negative for chills and fever.  HENT: Positive for congestion. Negative for sore throat.   Eyes: Negative.   Respiratory: Positive for cough, shortness of breath and wheezing.   Cardiovascular: Negative for chest pain, palpitations and leg swelling.  Gastrointestinal: Negative.  Negative for abdominal pain, diarrhea, nausea and vomiting.  Genitourinary: Negative.  Negative for dysuria and hematuria.  Musculoskeletal: Negative for back pain, myalgias and neck pain.  Skin: Negative for rash.  Neurological: Positive for weakness. Negative for dizziness and headaches.  Endo/Heme/Allergies: Negative.    Vitals:   08/07/17 0948 08/07/17 0958  BP: 110/76   Pulse: (!) 132 (!) 133  Resp: 16   Temp: 98.1 F (36.7 C)   SpO2: (!) 82% 94%      Physical Exam  Constitutional: He is oriented to person, place, and time. He appears well-developed and well-nourished. No distress.  HENT:  Head: Normocephalic and atraumatic.  Nose: Nose normal.  Mouth/Throat: Oropharynx is clear and moist.  Eyes: Conjunctivae and EOM are normal. Pupils are equal, round, and reactive to light.  Neck: Normal range of motion. Neck supple. No JVD present.  Cardiovascular:  No extrasystoles are present. Tachycardia present.  Regular tachycardia  Pulmonary/Chest: Effort normal. No respiratory distress. He has no wheezes. He has rales (both bases L>R).  +sternotomy scar with implantable defibrillator.  Abdominal: Soft. He exhibits no distension. There is no tenderness.  Musculoskeletal: Normal range of motion. He exhibits edema.  Lymphadenopathy:    He has no cervical adenopathy.  Neurological: He is alert and oriented to person, place, and time. No sensory deficit. He exhibits normal muscle tone.  Skin: Skin is warm and dry. Capillary refill takes less than 2 seconds. No rash noted.  Psychiatric: He has a normal mood and affect. His behavior is normal.  Vitals reviewed.  Results for orders placed or performed in visit on 08/07/17 (from the past 24 hour(s))  POCT CBC     Status: Abnormal   Collection Time: 08/07/17 10:12 AM  Result Value Ref Range   WBC 5.7 4.6 - 10.2 K/uL   Lymph, poc 1.4 0.6 - 3.4   POC LYMPH PERCENT 25.3 10 - 50 %L   MID (cbc) 0.5 0 - 0.9   POC MID % 9.5 0 - 12 %M   POC Granulocyte 3.7 2 - 6.9   Granulocyte percent 65.2 37 - 80 %G   RBC 4.14 (A) 4.69 - 6.13 M/uL   Hemoglobin 15.1 14.1 - 18.1 g/dL   HCT, POC 45.2 43.5 - 53.7 %   MCV 109.0 (A) 80 - 97 fL   MCH, POC 36.4 (A) 27 - 31.2 pg   MCHC 33.4 31.8 - 35.4 g/dL   RDW, POC 17.4 %   Platelet Count, POC 165 142 - 424 K/uL   MPV 7.3 0 - 99.8 fL  POCT glucose (manual entry)     Status: Abnormal   Collection Time: 08/07/17 10:18 AM  Result Value Ref Range   POC Glucose  299 (A) 70 - 99 mg/dl   EKG: SVT 134/min. No acute ischemic changes.  ASSESSMENT &  PLAN: Patient with a history of cardiomyopathy and congestive heart failure presenting complaining of difficulty breathing.  Vital signs show tachycardia with initial low O2 saturation.  Clinically in congestive heart failure.  No pulmonary edema on the chest x-ray no pneumonia.  EKG shows an SVT with no ischemic changes.  Diabetic noncompliant with medications.  Hyperglycemic.  Could be early DKA with difficulty breathing related to metabolic acidosis.  Advised to go to the emergency room.  Offered to call EMS for transport (preferred choice).  Patient declines.  He drove himself here.  States he has to take care of some business before he can go to the emergency room.  States he will go to the emergency room in the afternoon.  Antero was seen today for nasal congestion and shortness of breath.  Diagnoses and all orders for this visit:  Dyspnea, unspecified type -     EKG 12-Lead -     POCT glucose (manual entry)  Chronic systolic heart failure ([place])  Uncontrolled type 2 diabetes mellitus without complication, without long-term current use of insulin ([place]) -     Ambulatory referral to Ophthalmology  Hyperglycemia  Tachycardia  Lower respiratory infection -     POCT CBC -     DG Chest 2 View; Future -     albuterol (PROVENTIL) (2.5 MG/3ML) 0.083% nebulizer solution 2.5 mg -     ipratropium (ATROVENT) nebulizer solution 0.5 mg  Screening for colon cancer -     Ambulatory referral to Gastroenterology   Patient Instructions    Advised to go to ER now. EMS transport preferred. Pt states he has things to take care of before going there. States he will go in the afternoon.   IF you received an x-ray today, you will receive an invoice from [facility]. Please contact [facility] at [phone] with questions or concerns regarding your invoice.   IF you received labwork today, you  will receive an invoice from [facility]. Please contact [facility] at [phone] with questions or concerns regarding your invoice.   Our billing staff will not be able to assist you with questions regarding bills from these companies.  You will be contacted with the lab results as soon as they are available. The fastest way to get your results is to activate your My Chart account. Instructions are located on the last page of this paperwork. If you have not heard from [place] regarding the results in 2 weeks, please contact this office.        Agustina Caroli, MD [facility]

## 2017-08-07 NOTE — Progress Notes (Signed)
ANTICOAGULATION CONSULT NOTE - Initial Consult  Pharmacy Consult for heparin Indication: atrial fibrillation  No Known Allergies  Patient Measurements: Height: _0  (175.3 cm) Weight: 215 lb (97.5 kg) IBW/kg (Calculated) : 70.7 Heparin Dosing Weight: 91 kg  Assessment: 57 yo M presents on 2/5 with difficulty breathing over past 2 weeks. Found to be in Afib, pharmacy consulted to start heparin. No anticoag PTA. CBC stable.  Goal of Therapy:  Heparin level 0.3-0.7 units/ml Monitor platelets by anticoagulation protocol: Yes   Plan:  Give heparin 4,000 unit bolus Start heparin gtt at 1,150 units/hr Monitor daily heparin leve, CBC, s/s of bleed   Velvia Mehrer J 08/07/2017,9:17 PM

## 2017-08-07 NOTE — ED Notes (Addendum)
Pt c/o SOB for the last week, has been out of regular medication for over a month.  He was seen at PCP this morning and advised to go to the ED, but apparently had things to do.  He checked in at [facility] ED at 1455 for SOB, and was never seen by triage and never had an EKG before deciding to leave and come here.  Pt denies chest pain, denies fevers, denies chills, has not had significant edema either.

## 2017-08-07 NOTE — ED Provider Notes (Signed)
[facility] EMERGENCY DEPARTMENT Provider Note   CSN: [id number] Arrival date & time: 08/07/17  1816     History   Chief Complaint Chief Complaint  Patient presents with  . Shortness of Breath    HPI Vincent Chandler is a 57 y.o. male.  The history is provided by medical records and the patient. No language interpreter was used.  Shortness of Breath  This is a recurrent problem. The average episode lasts 1 week. The problem occurs continuously.The current episode started more than 2 days ago. The problem has been gradually worsening. Associated symptoms include cough and leg swelling. Pertinent negatives include no fever, no coryza, no rhinorrhea, no neck pain, no sputum production, no hemoptysis, no wheezing, no chest pain, no vomiting, no abdominal pain, no rash and no leg pain. He has tried nothing for the symptoms. The treatment provided no relief. He has had prior hospitalizations. Associated medical issues include CAD, heart failure and past MI.    Past Medical History:  Diagnosis Date  . Anginal pain ([place])   . Anxiety   . Asthma   . Atrial fibrillation-postoperative 11/28/2012  . Cardiomyopathy    alcohol use related  . CHF (congestive heart failure) ([place])   . Chronic back pain   . Coronary artery disease    drug eluting stent RCA 2005-EF 30%- s/p CABG x 4; 2/4 patent grafts with SVG-PLOM and SVG-RCA system totally occluded. There are collaterals from the LAD system to the RCA and the RCA is totally occluded proximally. There are no collaterals to the LCx territory. He then underwent successful PCI of the mid left circumflex artery with overlapping Synergy drug-eluting stents  . Depression   . Diabetes mellitus type II dx'd in the 1990's  . GERD (gastroesophageal reflux disease)   . H/O hiatal hernia   . History of hypogonadism   . Hyperlipidemia   . Hypertension   . Kidney stones   . Migraines    "used to"  . OSA on CPAP    "mask is broken; working on  getting a new one" (01/15/2015)  . Pneumonia 3-4 times  . Urinary incontinence     Patient Active Problem List   Diagnosis Date Noted  . Dyspnea 08/07/2017  . Chronic systolic heart failure ([place]) 08/07/2017  . Hyperglycemia 08/07/2017  . Lower respiratory infection 08/07/2017  . Screening for colon cancer 08/07/2017  . CKD (chronic kidney disease) 2-3 07/19/2014  . NSTEMI (non-ST elevated myocardial infarction) ([place]) 07/18/2014  . OSA (obstructive sleep apnea) 07/18/2014  . Tachycardia   . V tach ([place]) 07/17/2014  . Hypoxemia 07/17/2014  . CHF (congestive heart failure) ([place]) 06/04/2012  . Sinus tachycardia 12/29/2010  . Coronary artery disease prior RCA stent with new inferior Q waves 10/18/2010  . Ischemic and nonischemic cardiomyopathy   10/18/2010  . Uncontrolled type 2 diabetes mellitus without complication, without long-term current use of insulin ([place]) 03/10/2010  . Hyperlipidemia 03/10/2010  . Essential hypertension 03/10/2010    Past Surgical History:  Procedure Laterality Date  . CARDIAC DEFIBRILLATOR PLACEMENT  01/15/2015  . CORONARY ANGIOPLASTY WITH STENT PLACEMENT  ~ 2003  . CORONARY ARTERY BYPASS GRAFT N/A 08/15/2012   Procedure: CORONARY ARTERY BYPASS GRAFTING (CABG);  Surgeon: Ivin Poot, MD;  Location: [facility];  Service: Open Heart Surgery;  Laterality: N/A;  Coronary Artery Bypass Grafting Times Four Using Left Internal Mammary Artery and Right Saphenous Leg Vein Harvested Endoscopically  . EP IMPLANTABLE DEVICE N/A 01/15/2015   Procedure:  ICD Implant;  Surgeon: Deboraha Sprang, MD;  Location: [facility] CV LAB;  Service: Cardiovascular;  Laterality: N/A;  . LEFT HEART CATHETERIZATION WITH CORONARY ANGIOGRAM N/A 08/08/2012   Procedure: LEFT HEART CATHETERIZATION WITH CORONARY ANGIOGRAM;  Surgeon: Peter M Martinique, MD;  Location: [facility] CATH LAB;  Service: Cardiovascular;  Laterality: N/A;  . LEFT HEART CATHETERIZATION WITH CORONARY/GRAFT ANGIOGRAM N/A 07/21/2014    Procedure: LEFT HEART CATHETERIZATION WITH Beatrix Fetters;  Surgeon: Larey Dresser, MD;  Location: [facility] CATH LAB;  Service: Cardiovascular;  Laterality: N/A;  . PERCUTANEOUS CORONARY STENT INTERVENTION (PCI-S)  07/21/2014   Procedure: PERCUTANEOUS CORONARY STENT INTERVENTION (PCI-S);  Surgeon: Larey Dresser, MD;  Location: [facility] CATH LAB;  Service: Cardiovascular;;  . REFRACTIVE SURGERY Bilateral 1990's  . TONSILLECTOMY  ~ 1970       Home Medications    Prior to Admission medications   Medication Sig Start Date End Date Taking? Authorizing Provider  aspirin EC 81 MG tablet Take 81 mg by mouth daily. Reported on 07/14/2015 11/28/12   Deboraha Sprang, MD  atorvastatin (LIPITOR) 40 MG tablet Take 1 tablet (40 mg total) by mouth at bedtime. 12/14/15   Burtis Junes, NP  BAYER MICROLET LANCETS lancets Use as instructed to check blood sugar once a day dx code E11.65 11/12/14   Elayne Snare, MD  budesonide-formoterol [facility]) 160-4.5 MCG/ACT inhaler Inhale 2 puffs into the lungs 2 (two) times daily as needed (wheezing).    [provider]  carvedilol (COREG) 25 MG tablet Take 1 tablet (25 mg total) by mouth 2 (two) times daily. 03/01/16 07/12/16  Burtis Junes, NP  clopidogrel (PLAVIX) 75 MG tablet Take 1 tablet (75 mg total) by mouth daily. 07/12/16   Burtis Junes, NP  furosemide (LASIX) 40 MG tablet Take 2 tablets (80 mg total) by mouth daily. 07/12/16   Burtis Junes, NP  glimepiride (AMARYL) 2 MG tablet TAKE 1 TABLET (2 MG TOTAL) BY MOUTH DAILY BEFORE BREAKFAST. Patient not taking: Reported on 08/07/2017 10/22/15   [name], Alferd Apa, DO  glucose blood (BAYER CONTOUR NEXT TEST) test strip Use as instructed to check blood sugar once a day dx code E11.65 11/12/14   Elayne Snare, MD  nitroGLYCERIN (NITROSTAT) 0.4 MG SL tablet Place 1 tablet (0.4 mg total) under the tongue every 5 (five) minutes as needed for chest pain. 07/14/15   Burtis Junes, NP  sacubitril-valsartan  (ENTRESTO) 24-26 MG Take 1 tablet by mouth 2 (two) times daily. 07/12/16   Burtis Junes, NP  SitaGLIPtin-MetFORMIN HCl 50-1000 MG TB24 Take 2 tablets by mouth daily. 07/12/16   Burtis Junes, NP  spironolactone (ALDACTONE) 25 MG tablet Take 1 tablet (25 mg total) by mouth daily. 07/12/16   Burtis Junes, NP    Family History Family History  Problem Relation Age of Onset  . Hypertension Mother   . Diabetes Mother   . Hypertension Father   . Diabetes Father   . Hyperlipidemia Father     Social History Social History   Tobacco Use  . Smoking status: Never Smoker  . Smokeless tobacco: Current User    Types: Chew  Substance Use Topics  . Alcohol use: Yes    Alcohol/week: 3.6 oz    Types: 6 Shots of liquor per week  . Drug use: No     Allergies   Patient has no known allergies.   Review of Systems Review of Systems  Constitutional: Positive for fatigue.  Negative for chills, diaphoresis and fever.  HENT: Negative for congestion and rhinorrhea.   Eyes: Negative for photophobia and visual disturbance.  Respiratory: Positive for cough and shortness of breath. Negative for hemoptysis, sputum production, chest tightness, wheezing and stridor.   Cardiovascular: Positive for leg swelling. Negative for chest pain.  Gastrointestinal: Negative for abdominal pain, constipation, diarrhea, nausea and vomiting.  Genitourinary: Negative for decreased urine volume and dysuria.  Musculoskeletal: Negative for back pain, neck pain and neck stiffness.  Skin: Negative for rash and wound.  Neurological: Negative for light-headedness and numbness.  Psychiatric/Behavioral: Negative for agitation and confusion.  All other systems reviewed and are negative.    Physical Exam Updated Vital Signs BP (!) 160/115 (BP Location: Left Arm)   Pulse (!) 140   Temp 98.1 F (36.7 C) (Oral)   Resp (!) 28   Ht _0  (1.753 m)   Wt 97.5 kg (215 lb)   SpO2 97%   BMI 31.75 kg/m   Physical Exam    Constitutional: He is oriented to person, place, and time. He appears well-developed and well-nourished.  Non-toxic appearance. He does not appear ill. No distress.  HENT:  Head: Normocephalic.  Mouth/Throat: Oropharynx is clear and moist. No oropharyngeal exudate.  Eyes: Conjunctivae and EOM are normal. Pupils are equal, round, and reactive to light.  Neck: Normal range of motion.  Cardiovascular: Regular rhythm and intact distal pulses. Tachycardia present.  Murmur heard. Pulmonary/Chest: Effort normal. No stridor. No respiratory distress. He has no wheezes. He has no rhonchi. He has rales. He exhibits no tenderness.  Abdominal: Soft. There is no tenderness.  Musculoskeletal: He exhibits edema. He exhibits no tenderness.       Right lower leg: He exhibits edema.       Left lower leg: He exhibits edema.  Neurological: He is alert and oriented to person, place, and time. No sensory deficit. He exhibits normal muscle tone.  Skin: He is not diaphoretic. No erythema. No pallor.  Psychiatric: He has a normal mood and affect.  Nursing note and vitals reviewed.    ED Treatments / Results  Labs (all labs ordered are listed, but only abnormal results are displayed) Labs Reviewed  CBC - Abnormal; Notable for the following components:      Result Value   RBC 4.08 (*)    MCV 110.5 (*)    MCH 37.5 (*)    All other components within normal limits  TROPONIN I - Abnormal; Notable for the following components:   Troponin I 0.05 (*)    All other components within normal limits  COMPREHENSIVE METABOLIC PANEL - Abnormal; Notable for the following components:   Glucose, Bld 276 (*)    BUN 22 (*)    Creatinine, Ser 1.41 (*)    GFR calc non Af Amer 54 (*)    All other components within normal limits  BRAIN NATRIURETIC PEPTIDE - Abnormal; Notable for the following components:   B Natriuretic Peptide 423.7 (*)    All other components within normal limits  MAGNESIUM - Abnormal; Notable for the  following components:   Magnesium 1.6 (*)    All other components within normal limits  URINALYSIS, ROUTINE W REFLEX MICROSCOPIC - Abnormal; Notable for the following components:   Glucose, UA 100 (*)    All other components within normal limits  TROPONIN I - Abnormal; Notable for the following components:   Troponin I 0.07 (*)    All other components within normal limits  TROPONIN  I - Abnormal; Notable for the following components:   Troponin I 0.07 (*)    All other components within normal limits  PROTIME-INR  CBC  HEPARIN LEVEL (UNFRACTIONATED)  TROPONIN I    EKG  EKG Interpretation  Date/Time:  Tuesday August 07 2017 18:34:23 EST Ventricular Rate:  134 PR Interval:    QRS Duration: 111 QT Interval:  315 QTC Calculation: 471 R Axis:   -89 Text Interpretation:  Junctional tachycardia Inferior infarct, old When compared to prior, new Sinus tach vs junctional tach.  No STEMI Confirmed by Antony Blackbird [phone]) on 08/07/2017 6:41:07 PM       Radiology Dg Chest 2 View  Result Date: 08/07/2017 CLINICAL DATA:  Shortness of breath and cough for 1 week. EXAM: CHEST  2 VIEW COMPARISON:  08/07/2016 and prior radiographs FINDINGS: Cardiomegaly and CABG changes again noted. Mild bilateral airspace and interstitial opacities are more apparent on this study-question pneumonia versus edema. There may be trace bilateral pleural effusions present. No pneumothorax or acute bony abnormality. IMPRESSION: Mild bilateral airspace and interstitial opacities-question pneumonia versus edema. Cardiomegaly. Electronically Signed   By: Margarette Canada M.D.   On: 08/07/2017 19:43   Dg Chest 2 View  Result Date: 08/07/2017 CLINICAL DATA:  Lower respiratory infection EXAM: CHEST  2 VIEW COMPARISON:  01/16/2015 FINDINGS: Cardiac shadow is enlarged. Postsurgical changes are again seen. Defibrillator is again noted. The lungs are well aerated bilaterally. Some mild chronic interstitial changes are seen with mild  bibasilar atelectatic changes. No focal confluent infiltrate or sizable effusion is seen. No bony abnormality is seen. IMPRESSION: Mild bibasilar atelectatic changes. Electronically Signed   By: Inez Catalina M.D.   On: 08/07/2017 10:06    Procedures Procedures (including critical care time)  CRITICAL CARE Performed by: Gwenyth Allegra Tegeler Total critical care time: 35 minutes Exertional SOB with rising troponin and new arrhythmia of Aflutter requiring heparin and CHF exacerbation needing diuresis.  Critical care time was exclusive of separately billable procedures and treating other patients. Critical care was necessary to treat or prevent imminent or life-threatening deterioration. Critical care was time spent personally by me on the following activities: development of treatment plan with patient and/or surrogate as well as nursing, discussions with consultants, evaluation of patient's response to treatment, examination of patient, obtaining history from patient or surrogate, ordering and performing treatments and interventions, ordering and review of laboratory studies, ordering and review of radiographic studies, pulse oximetry and re-evaluation of patient's condition.   Medications Ordered in ED Medications  heparin ADULT infusion 100 units/mL (25000 units/261m sodium chloride 0.45%) (1,150 Units/hr Intravenous New Bag/Given 08/07/17 2139)  furosemide (LASIX) injection 40 mg (40 mg Intravenous Given 08/07/17 2119)  magnesium sulfate IVPB 2 g 50 mL (0 g Intravenous Stopped 08/07/17 2213)  heparin bolus via infusion 4,000 Units (4,000 Units Intravenous Bolus from Bag 08/07/17 2138)     Initial Impression / Assessment and Plan / ED Course  I have reviewed the triage vital signs and the nursing notes.  Pertinent labs & imaging results that were available during my care of the patient were reviewed by me and considered in my medical decision making (see chart for details).      TZygmund Passero is a 57y.o. male with a past medical history significant for CHF with ICD, hypertension, hyperlipidemia, CAD status post CABG and subsequent PCI, OSA, and CKD who presents from his internal medicine clinic visit for shortness of breath.  Patient reports that he has been  having worsening exertional shortness of breath for the last week.  He reports this feels similar to when he needed to have his last stent placed.  He reports no chest pain but reports he never had chest pain with previous heart troubles.  He reports a chronic dry cough but denies any fevers or chills.  He reports minimal rhinorrhea and congestion.  He reports that he has not been taking any of his medicines for the last month and a half and reports that his bilateral legs have been slightly more edematous over the last week.  He went to his PCP today where he was found to be hypoxic in the 80s.  Patient initially refused EMS transportation to the ED and decided to drive himself.  Patient then went to [facility] where there appeared to have a long wait time so the patient drove to med center [facility] which is where he is being evaluated currently.  On my initial evaluation, patient was found to be tachycardic and EKG initially showed a junctional tachycardia versus a flutter pattern.  Cardiology was called who agreed with a flutter which is new for the patient.  On my exam, patient did have oxygen saturations in the upper 80s and low 90s.  Patient was placed on 2 L when he was persistently in the 80s.  Patient had crackles in the bases of his lungs but otherwise no rhonchi.  Chest and abdomen were nontender.  Legs were slightly edematous bilaterally however he had symmetric pulses in upper and lower extremities.  Exam was otherwise unremarkable.  Chest x-ray confirmed pulmonary edema and clinically I am concerned about fluid overload.  Laboratory testing also revealed uptrending troponin and elevated BNP.  Cardiology was called who  recommended initiation of heparin due to the new a flutter, diuresis with Lasix, and replacement of magnesium which was found to be low.  They will admit the patient to a cardiology service for further management of the CHF exacerbation versus exertional shortness of breath from a cardiac ischemic etiology.  Patient will be admitted to cardiology for further management.      Final Clinical Impressions(s) / ED Diagnoses   Final diagnoses:  Acute on chronic congestive heart failure, unspecified heart failure type (HCC)  Hypoxia  SOB (shortness of breath)  Elevated troponin    Clinical Impression: 1. Acute on chronic congestive heart failure, unspecified heart failure type ([place])   2. Hypoxia   3. SOB (shortness of breath)   4. Elevated troponin     Disposition: Admit  This note was prepared with assistance of Dragon voice recognition software. Occasional wrong-word or sound-a-like substitutions may have occurred due to the inherent limitations of voice recognition software.      Tegeler, Gwenyth Allegra, MD 08/08/17 [phone]

## 2017-08-07 NOTE — Patient Instructions (Addendum)
  Advised to go to ER now. EMS transport preferred. Pt states he has things to take care of before going there. States he will go in the afternoon.   IF you received an x-ray today, you will receive an invoice from [facility]. Please contact [facility] at [phone] with questions or concerns regarding your invoice.   IF you received labwork today, you will receive an invoice from [facility]. Please contact [facility] at [phone] with questions or concerns regarding your invoice.   Our billing staff will not be able to assist you with questions regarding bills from these companies.  You will be contacted with the lab results as soon as they are available. The fastest way to get your results is to activate your My Chart account. Instructions are located on the last page of this paperwork. If you have not heard from [place] regarding the results in 2 weeks, please contact this office.

## 2017-08-08 ENCOUNTER — Other Ambulatory Visit: Payer: Self-pay

## 2017-08-08 DIAGNOSIS — I5023 Acute on chronic systolic (congestive) heart failure: Secondary | ICD-10-CM

## 2017-08-08 DIAGNOSIS — I4892 Unspecified atrial flutter: Secondary | ICD-10-CM | POA: Diagnosis present

## 2017-08-08 DIAGNOSIS — I483 Typical atrial flutter: Secondary | ICD-10-CM

## 2017-08-08 LAB — MRSA PCR SCREENING: MRSA by PCR: NEGATIVE

## 2017-08-08 LAB — TSH: TSH: 1.801 u[IU]/mL (ref 0.350–4.500)

## 2017-08-08 LAB — CBC
HCT: 41.8 % (ref 39.0–52.0)
Hemoglobin: 14.6 g/dL (ref 13.0–17.0)
MCH: 38.2 pg — AB (ref 26.0–34.0)
MCHC: 34.9 g/dL (ref 30.0–36.0)
MCV: 109.4 fL — ABNORMAL HIGH (ref 78.0–100.0)
PLATELETS: 162 10*3/uL (ref 150–400)
RBC: 3.82 MIL/uL — ABNORMAL LOW (ref 4.22–5.81)
RDW: 14.8 % (ref 11.5–15.5)
WBC: 6.7 10*3/uL (ref 4.0–10.5)

## 2017-08-08 LAB — TROPONIN I
TROPONIN I: 0.05 ng/mL — AB (ref <0.03)
Troponin I: 0.07 ng/mL (ref <0.03)
Troponin I: 0.07 ng/mL (ref <0.03)

## 2017-08-08 LAB — HEPARIN LEVEL (UNFRACTIONATED)
Heparin Unfractionated: 0.53 IU/mL (ref 0.30–0.70)
Heparin Unfractionated: 0.19 IU/mL — ABNORMAL LOW (ref 0.30–0.70)

## 2017-08-08 LAB — GLUCOSE, CAPILLARY: Glucose-Capillary: 242 mg/dL — ABNORMAL HIGH (ref 65–99)

## 2017-08-08 MED ORDER — ATORVASTATIN CALCIUM 40 MG PO TABS
40.0000 mg | ORAL_TABLET | Freq: Every day | ORAL | Status: DC
Start: 2017-08-08 — End: 2017-08-17
  Administered 2017-08-08 – 2017-08-16 (×9): 40 mg via ORAL
  Filled 2017-08-08 (×9): qty 1

## 2017-08-08 MED ORDER — AMIODARONE HCL IN DEXTROSE 360-4.14 MG/200ML-% IV SOLN
60.0000 mg/h | INTRAVENOUS | Status: DC
  Administered 2017-08-08 – 2017-08-09 (×2): 60 mg/h via INTRAVENOUS
  Filled 2017-08-08: qty 200

## 2017-08-08 MED ORDER — SACUBITRIL-VALSARTAN 24-26 MG PO TABS
1.0000 | ORAL_TABLET | Freq: Two times a day (BID) | ORAL | Status: DC
  Administered 2017-08-08 – 2017-08-09 (×2): 1 via ORAL
  Filled 2017-08-08 (×3): qty 1

## 2017-08-08 MED ORDER — INFLUENZA VAC SPLIT QUAD 0.5 ML IM SUSY
0.5000 mL | PREFILLED_SYRINGE | INTRAMUSCULAR | Status: AC
  Administered 2017-08-17: 0.5 mL via INTRAMUSCULAR
  Filled 2017-08-08: qty 0.5

## 2017-08-08 MED ORDER — APIXABAN 5 MG PO TABS
5.0000 mg | ORAL_TABLET | Freq: Two times a day (BID) | ORAL | Status: DC
  Administered 2017-08-08 – 2017-08-09 (×2): 5 mg via ORAL
  Filled 2017-08-08 (×2): qty 1

## 2017-08-08 MED ORDER — SODIUM CHLORIDE 0.9 % IV SOLN: 250.0000 mL | INTRAVENOUS | Status: DC | PRN

## 2017-08-08 MED ORDER — ONDANSETRON HCL 4 MG/2ML IJ SOLN: 4.0000 mg | Freq: Four times a day (QID) | INTRAMUSCULAR | Status: DC | PRN

## 2017-08-08 MED ORDER — PNEUMOCOCCAL VAC POLYVALENT 25 MCG/0.5ML IJ INJ: 0.5000 mL | INJECTION | INTRAMUSCULAR | Status: DC

## 2017-08-08 MED ORDER — SODIUM CHLORIDE 0.9% FLUSH: 3.0000 mL | INTRAVENOUS | Status: DC | PRN

## 2017-08-08 MED ORDER — NITROGLYCERIN 0.4 MG SL SUBL: 0.4000 mg | SUBLINGUAL_TABLET | SUBLINGUAL | Status: DC | PRN

## 2017-08-08 MED ORDER — ACETAMINOPHEN 325 MG PO TABS: 650.0000 mg | ORAL_TABLET | ORAL | Status: DC | PRN

## 2017-08-08 MED ORDER — AMIODARONE HCL IN DEXTROSE 360-4.14 MG/200ML-% IV SOLN
30.0000 mg/h | INTRAVENOUS | Status: DC
  Administered 2017-08-09 – 2017-08-14 (×11): 30 mg/h via INTRAVENOUS
  Filled 2017-08-08 (×12): qty 200

## 2017-08-08 MED ORDER — CARVEDILOL 3.125 MG PO TABS
3.1250 mg | ORAL_TABLET | Freq: Two times a day (BID) | ORAL | Status: DC
  Administered 2017-08-08 – 2017-08-11 (×7): 3.125 mg via ORAL
  Filled 2017-08-08 (×7): qty 1

## 2017-08-08 MED ORDER — SODIUM CHLORIDE 0.9% FLUSH
3.0000 mL | Freq: Two times a day (BID) | INTRAVENOUS | Status: DC
  Administered 2017-08-08 – 2017-08-16 (×11): 3 mL via INTRAVENOUS

## 2017-08-08 MED ORDER — MOMETASONE FURO-FORMOTEROL FUM 200-5 MCG/ACT IN AERO
2.0000 | INHALATION_SPRAY | Freq: Two times a day (BID) | RESPIRATORY_TRACT | Status: DC
  Administered 2017-08-08 – 2017-08-17 (×18): 2 via RESPIRATORY_TRACT
  Filled 2017-08-08: qty 8.8

## 2017-08-08 MED ORDER — INSULIN ASPART 100 UNIT/ML ~~LOC~~ SOLN
0.0000 [IU] | Freq: Three times a day (TID) | SUBCUTANEOUS | Status: DC
  Administered 2017-08-09 (×3): 5 [IU] via SUBCUTANEOUS
  Administered 2017-08-10 – 2017-08-11 (×2): 3 [IU] via SUBCUTANEOUS
  Administered 2017-08-11: 5 [IU] via SUBCUTANEOUS
  Administered 2017-08-11 – 2017-08-12 (×3): 3 [IU] via SUBCUTANEOUS
  Administered 2017-08-12 – 2017-08-13 (×2): 5 [IU] via SUBCUTANEOUS
  Administered 2017-08-13 (×2): 3 [IU] via SUBCUTANEOUS
  Administered 2017-08-14: 5 [IU] via SUBCUTANEOUS
  Administered 2017-08-14 (×2): 3 [IU] via SUBCUTANEOUS
  Administered 2017-08-15: 5 [IU] via SUBCUTANEOUS
  Administered 2017-08-15 – 2017-08-16 (×3): 3 [IU] via SUBCUTANEOUS
  Administered 2017-08-16: 5 [IU] via SUBCUTANEOUS
  Administered 2017-08-16: 3 [IU] via SUBCUTANEOUS
  Administered 2017-08-17: 5 [IU] via SUBCUTANEOUS
  Administered 2017-08-17: 2 [IU] via SUBCUTANEOUS

## 2017-08-08 MED ORDER — INSULIN ASPART 100 UNIT/ML ~~LOC~~ SOLN
0.0000 [IU] | Freq: Every day | SUBCUTANEOUS | Status: DC
  Administered 2017-08-08 – 2017-08-13 (×3): 2 [IU] via SUBCUTANEOUS

## 2017-08-08 MED ORDER — POTASSIUM CHLORIDE CRYS ER 20 MEQ PO TBCR
40.0000 meq | EXTENDED_RELEASE_TABLET | Freq: Once | ORAL | Status: AC
  Administered 2017-08-08: 40 meq via ORAL
  Filled 2017-08-08: qty 2

## 2017-08-08 MED ORDER — AMIODARONE LOAD VIA INFUSION
150.0000 mg | Freq: Once | INTRAVENOUS | Status: AC
  Administered 2017-08-08: 150 mg via INTRAVENOUS
  Filled 2017-08-08: qty 83.34

## 2017-08-08 MED ORDER — FUROSEMIDE 10 MG/ML IJ SOLN
40.0000 mg | Freq: Two times a day (BID) | INTRAMUSCULAR | Status: DC
  Administered 2017-08-08 – 2017-08-09 (×2): 40 mg via INTRAVENOUS
  Filled 2017-08-08 (×2): qty 4

## 2017-08-08 MED ORDER — HEPARIN (PORCINE) IN NACL 100-0.45 UNIT/ML-% IJ SOLN
1150.0000 [IU]/h | INTRAMUSCULAR | Status: DC
  Administered 2017-08-08: 1150 [IU]/h via INTRAVENOUS
  Filled 2017-08-08: qty 250

## 2017-08-08 NOTE — Progress Notes (Signed)
[facility] for heparin Indication: atrial fibrillation  No Known Allergies  Patient Measurements: Height: _0  (175.3 cm) Weight: 215 lb (97.5 kg) IBW/kg (Calculated) : 70.7 Heparin Dosing Weight: 91 kg  Assessment: 57 yo M presents on 2/5 with difficulty breathing over past 2 weeks. Found to be in Afib, pharmacy consulted to start heparin. No anticoag PTA. CBC stable. Initial heparin level is therapeutic.  Goal of Therapy:  Heparin level 0.3-0.7 units/ml Monitor platelets by anticoagulation protocol: Yes   Plan:  -Continue heparin at 1150 units/hr -Check confirmatory level later today -Re-enter labs if patient transfers to [facility]   Vincent Chandler, Vincent Chandler 08/08/2017,6:50 AM

## 2017-08-08 NOTE — Progress Notes (Signed)
Dr. [name] paged the following at 2025:  "6E11:[facility] from [facility].no c/o pain.heparin infusing.new EKG on chart. a-flutter 120's.has not taken meds in 2 months.$$."

## 2017-08-08 NOTE — ED Notes (Signed)
Pt keeps pulling nasal canula from nose while sleeping, nurse affixed tubing to face with pink tape.

## 2017-08-08 NOTE — ED Notes (Signed)
I spoke with Vincent Chandler in patient placement for a bed follow up. States no step down beds are avalible and there is 3 waiting in his ER that's been here longer. I did make aware that this pt has been here over 21hrs and we don't have a cafeteria here or pharmacy for home meds.

## 2017-08-08 NOTE — H&P (Signed)
Advanced Heart Failure Team History and Physical Note   PCP:  Ann Held, DO  PCP-Cardiology: Caryl Comes HF Cardiology: [name]  Reason for Admission: Decompensated CHF, atrial flutter   HPI:    57 yo with history of CAD s/p CABG, ischemic cardiomyopathy, prior VT, and medical noncompliance due to lack of insurance presents with acute on chronic systolic CHF in setting of atrial flutter with RVR.   Patient has not been seen by cardiology for about a year.  He has had spotty compliance with meds and office visits due to lack of job/insurance.  He has not taken any meds for at least a couple of months.  Last echo in 9/16 showed Ef 25-30%.  Last cath in 1/16 resulted in PCI to mLCx/PLOM.  He had post-op atrial fibrillation after CABG, it appears, but no known atrial arrhythmia has been seen since then until today.   He has felt "weak" for months with some dyspnea, but over the last 1.5 weeks, he has been very short of breath.  He has noted dyspnea walking up even a short flight of stairs.  He has been markedly orthopneic.  No chest pain.  No lightheadedness or syncope.  He does not feel palpitations.  He just got disability and is hoping that it will be easier for him to get his meds.  In the ER, he was noted to be in atrial flutter with RVR and was given IV Lasix and started on heparin gtt.    Currently, comfortable at rest.   Review of Systems: All systems reviewed and negative except as per HPI.   Home Medications Prior to Admission medications   Medication Sig Start Date End Date Taking? Authorizing Provider  aspirin EC 81 MG tablet Take 81 mg by mouth daily. Reported on 07/14/2015 11/28/12   Deboraha Sprang, MD  atorvastatin (LIPITOR) 40 MG tablet Take 1 tablet (40 mg total) by mouth at bedtime. 12/14/15   Burtis Junes, NP  BAYER MICROLET LANCETS lancets Use as instructed to check blood sugar once a day dx code E11.65 11/12/14   Elayne Snare, MD  budesonide-formoterol  [facility]) 160-4.5 MCG/ACT inhaler Inhale 2 puffs into the lungs 2 (two) times daily as needed (wheezing).    [provider]  carvedilol (COREG) 25 MG tablet Take 1 tablet (25 mg total) by mouth 2 (two) times daily. 03/01/16 07/12/16  Burtis Junes, NP  clopidogrel (PLAVIX) 75 MG tablet Take 1 tablet (75 mg total) by mouth daily. 07/12/16   Burtis Junes, NP  furosemide (LASIX) 40 MG tablet Take 2 tablets (80 mg total) by mouth daily. 07/12/16   Burtis Junes, NP  glimepiride (AMARYL) 2 MG tablet TAKE 1 TABLET (2 MG TOTAL) BY MOUTH DAILY BEFORE BREAKFAST. Patient not taking: Reported on 08/07/2017 10/22/15   [name], Alferd Apa, DO  glucose blood (BAYER CONTOUR NEXT TEST) test strip Use as instructed to check blood sugar once a day dx code E11.65 11/12/14   Elayne Snare, MD  nitroGLYCERIN (NITROSTAT) 0.4 MG SL tablet Place 1 tablet (0.4 mg total) under the tongue every 5 (five) minutes as needed for chest pain. 07/14/15   Burtis Junes, NP  sacubitril-valsartan (ENTRESTO) 24-26 MG Take 1 tablet by mouth 2 (two) times daily. 07/12/16   Burtis Junes, NP  SitaGLIPtin-MetFORMIN HCl 50-1000 MG TB24 Take 2 tablets by mouth daily. 07/12/16   Burtis Junes, NP  spironolactone (ALDACTONE) 25 MG tablet Take 1 tablet (  25 mg total) by mouth daily. 07/12/16   Burtis Junes, NP   Past Medical History: 1. Atrial fibrillation: Post-op CABG.  2. Atrial flutter: New diagnosis admission 08/08/17.  3. HTN 4. Type II diabetes 5. Obesity 6. H/o VT: [employer] ICD 7. H/o right arm DVT: 3 months Xarelto.  8. Chronic systolic CHF: Ischemic cardiomyopathy.  Echo (9/16) with EF 25-30%, inferolateral and inferior akinesis.  He has a [place].  9. CAD: S/p CABG with SVG-D, SVG-PLOM, SVG-RCA, LIMA-LAD.  - LHC (1/16) with patent LIMA-LAD and SVG-D.  RCA totally occluded, SVG-RCA totally occluded, SVG-PLOM totally occluded.  99% stenosis mLCx into PLOM => treated with DES.  10.  GERD 11. OSA: Has had CPAP in past.   Past Surgical History: Past Surgical History:  Procedure Laterality Date  . CARDIAC DEFIBRILLATOR PLACEMENT  01/15/2015  . CORONARY ANGIOPLASTY WITH STENT PLACEMENT  ~ 2003  . CORONARY ARTERY BYPASS GRAFT N/A 08/15/2012   Procedure: CORONARY ARTERY BYPASS GRAFTING (CABG);  Surgeon: Ivin Poot, MD;  Location: [place];  Service: Open Heart Surgery;  Laterality: N/A;  Coronary Artery Bypass Grafting Times Four Using Left Internal Mammary Artery and Right Saphenous Leg Vein Harvested Endoscopically  . EP IMPLANTABLE DEVICE N/A 01/15/2015   Procedure: ICD Implant;  Surgeon: Deboraha Sprang, MD;  Location: [place] CV LAB;  Service: Cardiovascular;  Laterality: N/A;  . LEFT HEART CATHETERIZATION WITH CORONARY ANGIOGRAM N/A 08/08/2012   Procedure: LEFT HEART CATHETERIZATION WITH CORONARY ANGIOGRAM;  Surgeon: Peter M Martinique, MD;  Location[facility] CATH LAB;  Service: Cardiovascular;  Laterality: N/A;  . LEFT HEART CATHETERIZATION WITH CORONARY/GRAFT ANGIOGRAM N/A 07/21/2014   Procedure: LEFT HEART CATHETERIZATION WITH Beatrix Fetters;  Surgeon: Larey Dresser, MD;  Location: [facility] CATH LAB;  Service: Cardiovascular;  Laterality: N/A;  . PERCUTANEOUS CORONARY STENT INTERVENTION (PCI-S)  07/21/2014   Procedure: PERCUTANEOUS CORONARY STENT INTERVENTION (PCI-S);  Surgeon: Larey Dresser, MD;  Location: [facility] CATH LAB;  Service: Cardiovascular;;  . REFRACTIVE SURGERY Bilateral 1990's  . TONSILLECTOMY  ~ 1970    Family History:  Family History  Problem Relation Age of Onset  . Hypertension Mother   . Diabetes Mother   . Hypertension Father   . Diabetes Father   . Hyperlipidemia Father     Social History: Social History   Socioeconomic History  . Marital status: Divorced    Spouse name: n/a  . Number of children: 0  . Years of education: 12th grade  . Highest education level: None  Social Needs  . Financial resource strain: None  . Food insecurity -  worry: None  . Food insecurity - inability: None  . Transportation needs - medical: None  . Transportation needs - non-medical: None  Occupational History  . Occupation: Data processing manager ---self employed    Employer: [employer]  Tobacco Use  . Smoking status: Never Smoker  . Smokeless tobacco: Current User    Types: Chew  Substance and Sexual Activity  . Alcohol use: Yes    Alcohol/week: 3.6 oz    Types: 6 Shots of liquor per week  . Drug use: No  . Sexual activity: Yes    Partners: Female  Other Topics Concern  . None  Social History Narrative   Exercise-- walking    Lives alone.   Brother lives in [place], [place]    Allergies:  No Known Allergies  Objective:    Vital Signs:   Temp:  [97.7 F (  36.5 C)] 97.7 F (36.5 C) (02/06 1957) Pulse Rate:  [40-138] 130 (02/06 1957) Resp:  [14-49] 16 (02/06 1957) BP: (106-158)/(84-136) 135/97 (02/06 1957) SpO2:  [93 %-100 %] 97 % (02/06 1957) Weight:  [210 lb 6.4 oz (95.4 kg)] 210 lb 6.4 oz (95.4 kg) (02/06 1957) Last BM Date: 08/08/17 Filed Weights   08/07/17 1828 08/08/17 1957  Weight: 215 lb (97.5 kg) 210 lb 6.4 oz (95.4 kg)     Physical Exam     General:  Well appearing. No respiratory difficulty HEENT: Normal Neck: Supple. JVP 14+ cm. Carotids 2+ bilat; no bruits. No lymphadenopathy or thyromegaly appreciated. Cor: PMI lateral. Tachy, regular rate & rhythm. No rubs, gallops or murmurs. Lungs: Clear bilaterally Abdomen: Soft, nontender, nondistended. No hepatosplenomegaly. No bruits or masses. Good bowel sounds. Extremities: No cyanosis, clubbing, rash.  Trace ankle edema Neuro: Alert & oriented x 3, cranial nerves grossly intact. moves all 4 extremities w/o difficulty. Affect pleasant.   Telemetry   Atrial flutter, rate in 130s (personally reviewed)  EKG   Atrial flutter at 125, left axis, poor RWP, inferior Qs (personally reviewed)  Labs     Basic Metabolic Panel: Recent Labs   Lab 08/07/17 1845 08/07/17 1847  NA  --  136  K  --  3.8  CL  --  101  CO2  --  24  GLUCOSE  --  276*  BUN  --  22*  CREATININE  --  1.41*  CALCIUM  --  9.3  MG 1.6*  --     Liver Function Tests: Recent Labs  Lab 08/07/17 1847  AST 32  ALT 24  ALKPHOS 78  BILITOT 0.9  PROT 7.6  ALBUMIN 3.7   No results for input(s): LIPASE, AMYLASE in the last 168 hours. No results for input(s): AMMONIA in the last 168 hours.  CBC: Recent Labs  Lab 08/07/17 1012 08/07/17 1845 08/08/17 0351  WBC 5.7 5.7 6.7  HGB 15.1 15.3 14.6  HCT 45.2 45.1 41.8  MCV 109.0* 110.5* 109.4*  PLT  --  164 162    Cardiac Enzymes: Recent Labs  Lab 08/07/17 1845 08/07/17 2130 08/08/17 0030 08/08/17 0351  TROPONINI 0.05* 0.07* 0.07* 0.07*    BNP: BNP (last 3 results) Recent Labs    08/07/17 1849  BNP 423.7*    ProBNP (last 3 results) No results for input(s): PROBNP in the last 8760 hours.   CBG: No results for input(s): GLUCAP in the last 168 hours.  Coagulation Studies: Recent Labs    08/07/17 1845  LABPROT 13.7  INR 1.06    Imaging: Dg Chest 2 View  Result Date: 08/07/2017 CLINICAL DATA:  Shortness of breath and cough for 1 week. EXAM: CHEST  2 VIEW COMPARISON:  08/07/2016 and prior radiographs FINDINGS: Cardiomegaly and CABG changes again noted. Mild bilateral airspace and interstitial opacities are more apparent on this study-question pneumonia versus edema. There may be trace bilateral pleural effusions present. No pneumothorax or acute bony abnormality. IMPRESSION: Mild bilateral airspace and interstitial opacities-question pneumonia versus edema. Cardiomegaly. Electronically Signed   By: Margarette Canada M.D.   On: 08/07/2017 19:43   Dg Chest 2 View  Result Date: 08/07/2017 CLINICAL DATA:  Lower respiratory infection EXAM: CHEST  2 VIEW COMPARISON:  01/16/2015 FINDINGS: Cardiac shadow is enlarged. Postsurgical changes are again seen. Defibrillator is again noted. The lungs  are well aerated bilaterally. Some mild chronic interstitial changes are seen with mild bibasilar atelectatic changes. No focal confluent infiltrate  or sizable effusion is seen. No bony abnormality is seen. IMPRESSION: Mild bibasilar atelectatic changes. Electronically Signed   By: Inez Catalina M.D.   On: 08/07/2017 10:06      Patient Profile   57 yo with history of CAD s/p CABG, ischemic cardiomyopathy, prior VT, and medical noncompliance due to lack of insurance presents with acute on chronic systolic CHF in setting of atrial flutter with RVR.   Assessment/Plan   1. Atrial flutter: With RVR.  He has history of post-op afib after CABG.  I suspect CHF exacerbation has been triggered by the onset of atrial flutter.  Based on symptoms, he has probably been in flutter > 1 week.   - Start amiodarone gtt for rate control. - Stop heparin gtt, will start him on Eliquis 5 mg bid.   - I think that he needs to get out of atrial flutter due to decompensated HF.  After 5th dose Eliquis (Friday afternoon), will plan TEE-guided DCCV to get him back in NSR.  Would also consider atrial flutter ablation down the road.  2. Acute on chronic systolic CHF: Echo in 2/58 with EF 25-30%.  Ischemic cardiomyopathy.  [place].  He is volume overloaded on exam.  Suspect exacerbation triggered by combination of atrial flutter and no meds x several months.  No chest pain, doubt ACS.  He is volume overloaded on exam.  Seems to have responded well to 40 mg IV Lasix.  - Lasix 40 mg IV bid.  - Restart Entresto 24/26 bid.  - Restart Coreg at lower dose, 3.125 mg bid, during exacerbation.  - Repeat echo.  3. CAD: s/p CABG.  1/16 cath with PCI to mLCx/PLOM.  No chest pain, doubt ACS.  Mild troponin elevation with no trend is likely demand ischemia from volume overload/tachycardia.  - Continue statin.  - Will be on Eliquis so will not restart ASA and Plavix (has not taken x at least 2 months).  4. Diabetes: Cover with  sliding scale for now.  5. He will need help with his medications, involve HF navigator.   Loralie Champagne, MD 08/08/2017, 9:23 PM  Advanced Heart Failure Team Pager [phone] (M-F; 7a - 4p)  Please contact [facility] for night-coverage after hours (4p -7a ) and weekends on [email or web address]

## 2017-08-08 NOTE — ED Notes (Signed)
carelink at bedside; pt ambulatory to restroom with no difficulties

## 2017-08-08 NOTE — Progress Notes (Deleted)
[place] for heparin Indication: atrial fibrillation  No Known Allergies  Patient Measurements: Height: 5' 9" (175.3 cm) Weight: 215 lb (97.5 kg) IBW/kg (Calculated) : 70.7 Heparin Dosing Weight: 91 kg  Assessment: 57 yo M presents on 2/5 with difficulty breathing over past 2 weeks. Found to be in Afib, pharmacy consulted to start heparin. No anticoag PTA. CBC stable.   Follow up heparin level is below goal.  Goal of Therapy:  Heparin level 0.3-0.7 units/ml Monitor platelets by anticoagulation protocol: Yes   Plan:  -Increase heparin at 1350 units/hr -Check heparin level later tonight -Re-enter labs if patient transfers to [place] PharmD., BCPS Clinical Pharmacist 08/08/2017 5:16 PM

## 2017-08-08 NOTE — Progress Notes (Signed)
[place] for heparin > Eliquis Indication: atrial fibrillation  No Known Allergies  Patient Measurements: Height: _0  (177.8 cm) Weight: 210 lb 6.4 oz (95.4 kg) IBW/kg (Calculated) : 73 Heparin Dosing Weight: 91 kg  Assessment: 57 yo M presents on 2/5 with difficulty breathing over past 2 weeks. Found to be in Afib, pharmacy consulted to start heparin. Now asked to convert to po Eliquis.  Goal of Therapy:  Heparin level 0.3-0.7 units/ml Monitor platelets by anticoagulation protocol: Yes   Plan:  -Eliquis 5 mg BID. -Stop IV heparin at the time the first Eliquis dose is given.  Uvaldo Rising, BCPS  Clinical Pharmacist Pager [phone]  08/08/2017 9:28 PM

## 2017-08-08 NOTE — Progress Notes (Signed)
[place] for heparin Indication: atrial fibrillation  No Known Allergies  Patient Measurements: Height: _0  (175.3 cm) Weight: 215 lb (97.5 kg) IBW/kg (Calculated) : 70.7 Heparin Dosing Weight: 91 kg  Assessment: 57 yo M presents on 2/5 with difficulty breathing over past 2 weeks. Found to be in Afib, pharmacy consulted to start heparin. No anticoag PTA. CBC stable. Initial heparin level is therapeutic, but confirmatory level now below goal.  Spoke with RN at [facility], she reports infusion was stopped at 1159 AM due to being "completed."  Goal of Therapy:  Heparin level 0.3-0.7 units/ml Monitor platelets by anticoagulation protocol: Yes   Plan:  -Resume IV heparin now at previous rate of 1150 units/hr. -Recheck heparin level in 6 hrs. -Daily heparin level and CBC. -Re-enter labs if patient transfers to [facility]   Uvaldo Rising, [place] Pharmacist Pager [phone]  08/08/2017 5:16 PM

## 2017-08-09 ENCOUNTER — Inpatient Hospital Stay (HOSPITAL_COMMUNITY): Payer: Medicare Other

## 2017-08-09 DIAGNOSIS — I340 Nonrheumatic mitral (valve) insufficiency: Secondary | ICD-10-CM

## 2017-08-09 DIAGNOSIS — I5043 Acute on chronic combined systolic (congestive) and diastolic (congestive) heart failure: Secondary | ICD-10-CM

## 2017-08-09 LAB — CBC WITH DIFFERENTIAL/PLATELET
BASOS ABS: 0 10*3/uL (ref 0.0–0.1)
Basophils Relative: 0 %
EOS ABS: 0.2 10*3/uL (ref 0.0–0.7)
Eosinophils Relative: 3 %
HCT: 45.2 % (ref 39.0–52.0)
Hemoglobin: 14.5 g/dL (ref 13.0–17.0)
LYMPHS ABS: 1.7 10*3/uL (ref 0.7–4.0)
Lymphocytes Relative: 30 %
MCH: 36.8 pg — ABNORMAL HIGH (ref 26.0–34.0)
MCHC: 32.1 g/dL (ref 30.0–36.0)
MCV: 114.7 fL — ABNORMAL HIGH (ref 78.0–100.0)
Monocytes Absolute: 0.6 10*3/uL (ref 0.1–1.0)
Monocytes Relative: 10 %
NEUTROS ABS: 3.3 10*3/uL (ref 1.7–7.7)
Neutrophils Relative %: 57 %
PLATELETS: 160 10*3/uL (ref 150–400)
RBC: 3.94 MIL/uL — AB (ref 4.22–5.81)
RDW: 15.7 % — AB (ref 11.5–15.5)
WBC: 5.8 10*3/uL (ref 4.0–10.5)

## 2017-08-09 LAB — GLUCOSE, CAPILLARY
Glucose-Capillary: 227 mg/dL — ABNORMAL HIGH (ref 65–99)
Glucose-Capillary: 249 mg/dL — ABNORMAL HIGH (ref 65–99)
Glucose-Capillary: 134 mg/dL — ABNORMAL HIGH (ref 65–99)

## 2017-08-09 LAB — COOXEMETRY PANEL
CARBOXYHEMOGLOBIN: 1.3 % (ref 0.5–1.5)
Carboxyhemoglobin: 1.3 % (ref 0.5–1.5)
Methemoglobin: 1.2 % (ref 0.0–1.5)
Methemoglobin: 1.1 % (ref 0.0–1.5)
O2 SAT: 42.1 %
O2 Saturation: 62.4 %
TOTAL HEMOGLOBIN: 14.2 g/dL (ref 12.0–16.0)
Total hemoglobin: 12.2 g/dL (ref 12.0–16.0)

## 2017-08-09 LAB — BASIC METABOLIC PANEL
Anion gap: 15 (ref 5–15)
BUN: 24 mg/dL — ABNORMAL HIGH (ref 6–20)
CALCIUM: 9 mg/dL (ref 8.9–10.3)
CHLORIDE: 98 mmol/L — AB (ref 101–111)
CO2: 25 mmol/L (ref 22–32)
CREATININE: 1.64 mg/dL — AB (ref 0.61–1.24)
GFR calc non Af Amer: 45 mL/min — ABNORMAL LOW (ref >60)
GFR, EST AFRICAN AMERICAN: 52 mL/min — AB (ref >60)
Glucose, Bld: 216 mg/dL — ABNORMAL HIGH (ref 65–99)
Potassium: 4.3 mmol/L (ref 3.5–5.1)
SODIUM: 138 mmol/L (ref 135–145)

## 2017-08-09 LAB — ECHOCARDIOGRAM COMPLETE
HEIGHTINCHES: 70 in
WEIGHTICAEL: 3345.6 [oz_av]

## 2017-08-09 LAB — HIV ANTIBODY (ROUTINE TESTING W REFLEX): HIV Screen 4th Generation wRfx: NONREACTIVE

## 2017-08-09 LAB — TROPONIN I
TROPONIN I: 0.06 ng/mL — AB (ref <0.03)
TROPONIN I: 0.04 ng/mL — AB (ref <0.03)

## 2017-08-09 MED ORDER — DIGOXIN 125 MCG PO TABS: 0.1250 mg | ORAL_TABLET | Freq: Every day | ORAL | Status: DC

## 2017-08-09 MED ORDER — GUAIFENESIN ER 600 MG PO TB12
600.0000 mg | ORAL_TABLET | Freq: Two times a day (BID) | ORAL | Status: DC | PRN
  Administered 2017-08-09 – 2017-08-11 (×2): 600 mg via ORAL
  Filled 2017-08-09 (×2): qty 1

## 2017-08-09 MED ORDER — LOSARTAN POTASSIUM 25 MG PO TABS
12.5000 mg | ORAL_TABLET | Freq: Every day | ORAL | Status: DC
  Administered 2017-08-10: 12.5 mg via ORAL
  Filled 2017-08-09: qty 1

## 2017-08-09 MED ORDER — SODIUM CHLORIDE 0.9% FLUSH: 3.0000 mL | INTRAVENOUS | Status: DC | PRN

## 2017-08-09 MED ORDER — APIXABAN 5 MG PO TABS
5.0000 mg | ORAL_TABLET | Freq: Two times a day (BID) | ORAL | Status: AC
  Administered 2017-08-09: 5 mg via ORAL
  Filled 2017-08-09: qty 1

## 2017-08-09 MED ORDER — FUROSEMIDE 10 MG/ML IJ SOLN
80.0000 mg | Freq: Two times a day (BID) | INTRAMUSCULAR | Status: DC
  Administered 2017-08-09 – 2017-08-10 (×3): 80 mg via INTRAVENOUS
  Filled 2017-08-09 (×3): qty 8

## 2017-08-09 MED ORDER — SALINE SPRAY 0.65 % NA SOLN
1.0000 | NASAL | Status: DC | PRN
  Filled 2017-08-09: qty 44

## 2017-08-09 MED ORDER — SODIUM CHLORIDE 0.9 % IV SOLN: 250.0000 mL | INTRAVENOUS | Status: DC

## 2017-08-09 MED ORDER — PERFLUTREN LIPID MICROSPHERE
1.0000 mL | INTRAVENOUS | Status: AC | PRN
Start: 2017-08-09 — End: 2017-08-09
  Administered 2017-08-09: 2 mL via INTRAVENOUS
  Filled 2017-08-09: qty 10

## 2017-08-09 MED ORDER — DIGOXIN 125 MCG PO TABS
0.1250 mg | ORAL_TABLET | Freq: Every day | ORAL | Status: DC
  Administered 2017-08-09 – 2017-08-17 (×9): 0.125 mg via ORAL
  Filled 2017-08-09 (×10): qty 1

## 2017-08-09 MED ORDER — SODIUM CHLORIDE 0.9% FLUSH
10.0000 mL | INTRAVENOUS | Status: DC | PRN
  Administered 2017-08-14: 10 mL
  Filled 2017-08-09: qty 40

## 2017-08-09 MED ORDER — SODIUM CHLORIDE 0.9% FLUSH: 3.0000 mL | Freq: Two times a day (BID) | INTRAVENOUS | Status: DC

## 2017-08-09 MED ORDER — SODIUM CHLORIDE 0.9% FLUSH
10.0000 mL | Freq: Two times a day (BID) | INTRAVENOUS | Status: DC
  Administered 2017-08-09: 20 mL
  Administered 2017-08-09: 10 mL
  Administered 2017-08-10 – 2017-08-12 (×2): 20 mL
  Administered 2017-08-14: 10 mL
  Administered 2017-08-15: 20 mL
  Administered 2017-08-16: 10 mL

## 2017-08-09 MED ORDER — APIXABAN 5 MG PO TABS
5.0000 mg | ORAL_TABLET | ORAL | Status: AC
  Administered 2017-08-10 (×2): 5 mg via ORAL
  Filled 2017-08-09 (×2): qty 1

## 2017-08-09 MED ORDER — MILRINONE LACTATE IN DEXTROSE 20-5 MG/100ML-% IV SOLN
0.1250 ug/kg/min | INTRAVENOUS | Status: DC
  Administered 2017-08-09: 0.25 ug/kg/min via INTRAVENOUS
  Administered 2017-08-10 (×2): 0.375 ug/kg/min via INTRAVENOUS
  Administered 2017-08-10: 0.25 ug/kg/min via INTRAVENOUS
  Administered 2017-08-11 – 2017-08-12 (×5): 0.375 ug/kg/min via INTRAVENOUS
  Administered 2017-08-13 – 2017-08-14 (×3): 0.25 ug/kg/min via INTRAVENOUS
  Administered 2017-08-15: 0.125 ug/kg/min via INTRAVENOUS
  Filled 2017-08-09 (×15): qty 100

## 2017-08-09 NOTE — Progress Notes (Signed)
Patient discussed with Case Manager and HF APP.  I will see patient tomorrow and review HF recommendations for home as well as discuss barriers to care.  I will send all medications to be filled through the A[facility]

## 2017-08-09 NOTE — Care Management Note (Addendum)
Case Management Note  Patient Details  Name: Vincent Chandler MRN: [id number] Date of Birth: [birth year]  Subjective/Objective: Pt presented for CHF- Initiated on IV Lasix. Pt was discussed in progression rounds in regards for medication assistance. Per pt he has Medicare A&B, however no part D for Rx drug Coverage. Part D will cost the patient an extra $30.00 per month. Pt states he just received his disability. Pt still inside the window to get his Part D Rx Drug Coverage. Pt has utilized his 30 day free Eliquis Card- unsure why patient never received forms for patient assistance via the company. CM can provide pt with 30 day free [employer]. Pt is not homebound- so he will not qualify for [facility].                     Action/Plan: Unfortunately CM will not be able to assist with other medications since the patient has Medicare. CM did reach out to the Heart Failure Navigator to see if they can assist with medications via the [facility]. CM also discussed the options of maybe looking into cheaper pharmacies like [facility] $4.00 list &  the [facility].  Pt may contact Cardiologist Office for samples. No further needs from CM at this time.   Expected Discharge Date:               Expected Discharge Plan:  Home/Self Care  In-House Referral:  NA  Discharge planning Services  CM Consult, Medication Assistance  Post Acute Care Choice:  NA Choice offered to:  NA  DME Arranged:  N/A DME Agency:  NA  HH Arranged:  NA HH Agency:  NA  Status of Service:  Completed, signed off  If discussed at [place] of Stay Meetings, dates discussed:    Additional Comments: 1525 2-01-08-17 Vincent Krauss, RN,BSN [phone] CM did speak with [facility] [place] and she will assist patient with medications via the [facility]. Paramedicine will follow outpatient. No further needs from CM at this time.  Vincent Roys, RN 08/09/2017, 2:37  PM

## 2017-08-09 NOTE — Progress Notes (Signed)
Advanced Heart Failure Rounding Note  PCP-Cardiologist: Dr. [name]  Subjective:    -2.2 L yesterday with 40 mg IV lasix BID. Weight down 1 lb. Creatinine 1.64. Still in Atrial flutter, but rates now 80's. Started eliquis last night. Plan for TEE/DC-CV on Friday afternoon.  No CP, SOB, palpitations, dizziness, or bleeding. Feels slightly better from yesterday. Has not ambulated.    Objective:   Weight Range: 209 lb 1.6 oz (94.8 kg) Body mass index is 30 kg/m.   Vital Signs:   Temp:  [97.7 F (36.5 C)-97.9 F (36.6 C)] 97.8 F (36.6 C) (02/07 0843) Pulse Rate:  [48-137] 111 (02/07 0819) Resp:  [14-49] 17 (02/07 0540) BP: (92-152)/(66-136) 113/89 (02/07 0819) SpO2:  [93 %-100 %] 97 % (02/07 0819) Weight:  [209 lb 1.6 oz (94.8 kg)-210 lb 6.4 oz (95.4 kg)] 209 lb 1.6 oz (94.8 kg) (02/07 0540) Last BM Date: 08/08/17  Weight change: Filed Weights   08/07/17 1828 08/08/17 1957 08/09/17 0540  Weight: 215 lb (97.5 kg) 210 lb 6.4 oz (95.4 kg) 209 lb 1.6 oz (94.8 kg)    Intake/Output:   Intake/Output Summary (Last 24 hours) at 08/09/2017 0921 Last data filed at 08/09/2017 0541 Gross per 24 hour  Intake 704.34 ml  Output 800 ml  Net -95.66 ml      Physical Exam    General:  Well appearing. No resp difficulty HEENT: Normal Neck: Supple. JVP 10-12 . Carotids 2+ bilat; no bruits. No lymphadenopathy or thyromegaly appreciated. Cor: PMI laterally displaced. Irregular rhythm. No rubs, gallops or murmurs. Distant heart sounds Lungs: Clear Abdomen: Soft, nontender, nondistended. No hepatosplenomegaly. No bruits or masses. Good bowel sounds. Extremities: No cyanosis, clubbing, rash, edema Neuro: Alert & orientedx3, cranial nerves grossly intact. moves all 4 extremities w/o difficulty. Affect pleasant   Telemetry   Atrial flutter up to 130's yesterday evening. Now Atrial flutter HR 80's. Personally reviewed.  EKG    Atrial flutter with left axis deviation, 88 bpm.  Personally reviewed.  Labs    CBC Recent Labs    08/08/17 0351 08/09/17 0420  WBC 6.7 5.8  NEUTROABS  --  3.3  HGB 14.6 14.5  HCT 41.8 45.2  MCV 109.4* 114.7*  PLT 162 474   Basic Metabolic Panel Recent Labs    08/07/17 1845 08/07/17 1847 08/09/17 0420  NA  --  136 138  K  --  3.8 4.3  CL  --  101 98*  CO2  --  24 25  GLUCOSE  --  276* 216*  BUN  --  22* 24*  CREATININE  --  1.41* 1.64*  CALCIUM  --  9.3 9.0  MG 1.6*  --   --    Liver Function Tests Recent Labs    08/07/17 1847  AST 32  ALT 24  ALKPHOS 78  BILITOT 0.9  PROT 7.6  ALBUMIN 3.7   No results for input(s): LIPASE, AMYLASE in the last 72 hours. Cardiac Enzymes Recent Labs    08/08/17 0351 08/08/17 2240 08/09/17 0420  TROPONINI 0.07* 0.05* 0.06*    BNP: BNP (last 3 results) Recent Labs    08/07/17 1849  BNP 423.7*    ProBNP (last 3 results) No results for input(s): PROBNP in the last 8760 hours.   D-Dimer No results for input(s): DDIMER in the last 72 hours. Hemoglobin A1C No results for input(s): HGBA1C in the last 72 hours. Fasting Lipid Panel No results for input(s): CHOL, HDL, LDLCALC, TRIG, CHOLHDL,  LDLDIRECT in the last 72 hours. Thyroid Function Tests Recent Labs    08/08/17 2240  TSH 1.801    Other results:   Imaging     No results found.   Medications:     Scheduled Medications: . apixaban  5 mg Oral BID  . atorvastatin  40 mg Oral QHS  . carvedilol  3.125 mg Oral BID  . furosemide  40 mg Intravenous BID  . Influenza vac split quadrivalent PF  0.5 mL Intramuscular Tomorrow-1000  . insulin aspart  0-15 Units Subcutaneous TID WC  . insulin aspart  0-5 Units Subcutaneous QHS  . mometasone-formoterol  2 puff Inhalation BID  . pneumococcal 23 valent vaccine  0.5 mL Intramuscular Tomorrow-1000  . sacubitril-valsartan  1 tablet Oral BID  . sodium chloride flush  3 mL Intravenous Q12H     Infusions: . sodium chloride    . amiodarone 30 mg/hr  (08/09/17 0441)     PRN Medications:  sodium chloride, acetaminophen, nitroGLYCERIN, ondansetron (ZOFRAN) IV, sodium chloride flush    Patient Profile   Vincent Chandler is a 57 y.o. male with history of CAD s/p CABG, ischemic cardiomyopathy, prior VT, and medical noncompliance due to lack of insurance presents with acute on chronic systolic CHF in setting of atrial flutter with RVR.    Assessment/Plan   1. Atrial flutter: With RVR.  He has history of post-op afib after CABG.  I suspect CHF exacerbation has been triggered by the onset of atrial flutter.  Based on symptoms, he has probably been in flutter > 1 week.   - Continue amiodarone gtt. Rates now 80-90s. - Continue eliquis 5 mg BID - Start digoxin 0.125 daily. - Plan for TEE/DCCV tomorrow at 2 pm. He needs to get two doses of eliquis in before. Discussed with PharmD on scheduling and will discuss with RN also. 2. Acute on chronic systolic CHF: Echo in 7/09 with EF 25-30%.  Ischemic cardiomyopathy.  [place].  Suspect exacerbation triggered by combination of atrial flutter and no meds x several months.   - Volume status elevated on exam. - Continue lasix 40 mg IV bid.  - Continue Entresto 24/26 bid.  - Continue Coreg at lower dose, 3.125 mg bid, during exacerbation.  - Place PICC line to monitor CVP and co-ox. - Echo pending 3. CAD: s/p CABG.  1/16 cath with PCI to mLCx/PLOM.  No chest pain, doubt ACS.  - Troponin 0.07 > 0.05 > 0.06. Likely demand ischemia  - Continue statin.  - Now on Eliquis so will not restart ASA and Plavix (has not taken x at least 2 months). - No s/s ischemia. - Echo pending 4. Diabetes: Cover with sliding scale for now. No change. 5. CKD, baseline creatinine 1.4-1.6 - Monitor daily BMET - Creatinine 1.41 > 1.64 6. He will need help with his medications, involve HF navigator.  - Has been off meds for several months. Lives alone in [place]  Medication concerns reviewed with patient  and pharmacy team. Barriers identified: compliance, lack of insurance/income but just got disability  Length of Stay: [name], NP-C 08/09/17, 9:21 AM Pager [phone] (M-F 7a-4p)  Advanced Heart Failure Team Pager [phone] (M-F; 7a - 4p)  Please contact [facility] for night-coverage after hours (4p -7a ) and weekends on [email or web address]  Patient seen with NP, agree with the above note.   He feels better today but still some dyspnea.  Creatinine up to 1.64, UOP has not been vigorous.  On exam, he still looks volume overloaded with JVD.  Irregular rhythm but now rate-controlled on IV amiodarone.  He remains in atrial flutter.   1. Atrial flutter: With RVR.  He has history of post-op afib after CABG.  I suspect CHF exacerbation has been triggered by the onset of atrial flutter.  Based on symptoms, he has probably been in flutter > 1 week.  Rate now controlled on amiodarone.  - Continue amiodarone gtt for rate control. - Continue Eliquis 5 mg bid.   - I think that he needs to get out of atrial flutter due to decompensated HF.  After 5th dose Eliquis (Friday afternoon), will plan TEE-guided DCCV to get him back in NSR.  Would also consider atrial flutter ablation down the road.  2. Acute on chronic systolic CHF: Echo in 1/96 with EF 25-30%.  Ischemic cardiomyopathy. [place].  Suspect exacerbation triggered by combination of atrial flutter and no meds x several months.  No chest pain, doubt ACS.  He is still volume overloaded on exam.  Some diuresis so far with IV Lasix but seems to be decreasing and creatinine 1.4 => 1.64.  I reviewed echo at bedside, EF appears no more than 20%.  I am concerned for possible low output.   - Will place PICC line to follow co-ox and CVP, ?low output with need for milrinone.  - Continue Entresto 24/26 bid, no room to increase. - Will add digoxin 0.125 daily.  - Continue Coreg 3.125 mg bid.  - Echo done this am.  3. CAD: s/p CABG.  1/16 cath with  PCI to mLCx/PLOM.  No chest pain, doubt ACS.  Mild troponin elevation with no trend is likely demand ischemia from volume overload/tachycardia.  - Continue statin.  - Will be on Eliquis so will not restart ASA and Plavix (has not taken x at least 2 months).  4.CKD: Creatinine 1.4 => 1.64.  Follow closely with diuresis.  5. He will need help with his medications, involve HF navigator.   Loralie Champagne 08/09/2017 11:11 AM

## 2017-08-09 NOTE — Progress Notes (Signed)
  Echocardiogram 2D Echocardiogram has been performed.  Vincent Chandler 08/09/2017, 11:36 AM

## 2017-08-09 NOTE — Progress Notes (Signed)
Peripherally Inserted Central Catheter/Midline Placement  The IV Nurse has discussed with the patient and/or persons authorized to consent for the patient, the purpose of this procedure and the potential benefits and risks involved with this procedure.  The benefits include less needle sticks, lab draws from the catheter, and the patient may be discharged home with the catheter. Risks include, but not limited to, infection, bleeding, blood clot (thrombus formation), and puncture of an artery; nerve damage and irregular heartbeat and possibility to perform a PICC exchange if needed/ordered by physician.  Alternatives to this procedure were also discussed.  Bard Power PICC patient education guide, fact sheet on infection prevention and patient information card has been provided to patient /or left at bedside.    PICC/Midline Placement Documentation  PICC Double Lumen 11/57/26 PICC Right Basilic 43 cm 0 cm (Active)  Indication for Insertion or Continuance of Line Vasoactive infusions;Prolonged intravenous therapies 08/09/2017 12:40 PM  Exposed Catheter (cm) 0 cm 08/09/2017 12:40 PM  Site Assessment Clean;Dry;Intact 08/09/2017 12:40 PM  Lumen #1 Status Flushed;Saline locked;Blood return noted 08/09/2017 12:40 PM  Lumen #2 Status Flushed;Saline locked;Blood return noted 08/09/2017 12:40 PM  Dressing Type Transparent;Securing device 08/09/2017 12:40 PM  Dressing Status Clean;Dry;Intact;Antimicrobial disc in place 08/09/2017 12:40 PM  Dressing Change Due 08/16/17 08/09/2017 12:40 PM       Frances Maywood 08/09/2017, 12:49 PM

## 2017-08-09 NOTE — Plan of Care (Signed)
PICC line placed for CVP, Co ox measuring. IV lasix increased to 80 mg BID.

## 2017-08-10 ENCOUNTER — Inpatient Hospital Stay (HOSPITAL_COMMUNITY): Payer: Medicare Other | Admitting: Anesthesiology

## 2017-08-10 ENCOUNTER — Other Ambulatory Visit: Payer: Self-pay

## 2017-08-10 ENCOUNTER — Encounter (HOSPITAL_COMMUNITY)
Admission: EM | Disposition: A | Discharge status: Home / Self Care | Payer: Self-pay | Source: Home / Self Care | Attending: Cardiology

## 2017-08-10 ENCOUNTER — Encounter (HOSPITAL_COMMUNITY): Payer: Self-pay | Admitting: *Deleted

## 2017-08-10 ENCOUNTER — Inpatient Hospital Stay (HOSPITAL_COMMUNITY): Payer: Medicare Other

## 2017-08-10 DIAGNOSIS — I340 Nonrheumatic mitral (valve) insufficiency: Secondary | ICD-10-CM

## 2017-08-10 DIAGNOSIS — I4892 Unspecified atrial flutter: Secondary | ICD-10-CM

## 2017-08-10 HISTORY — PX: CARDIOVERSION: SHX1299

## 2017-08-10 HISTORY — PX: TEE WITHOUT CARDIOVERSION: SHX5443

## 2017-08-10 LAB — COOXEMETRY PANEL
CARBOXYHEMOGLOBIN: 1.3 % (ref 0.5–1.5)
CARBOXYHEMOGLOBIN: 1.5 % (ref 0.5–1.5)
Methemoglobin: 1.2 % (ref 0.0–1.5)
Methemoglobin: 0.9 % (ref 0.0–1.5)
O2 SAT: 53.8 %
O2 Saturation: 49.5 %
TOTAL HEMOGLOBIN: 13.8 g/dL (ref 12.0–16.0)
Total hemoglobin: 14.1 g/dL (ref 12.0–16.0)

## 2017-08-10 LAB — CBC WITH DIFFERENTIAL/PLATELET
BASOS ABS: 0 10*3/uL (ref 0.0–0.1)
Basophils Relative: 0 %
EOS PCT: 4 %
Eosinophils Absolute: 0.3 10*3/uL (ref 0.0–0.7)
HCT: 40.7 % (ref 39.0–52.0)
Hemoglobin: 13.4 g/dL (ref 13.0–17.0)
Lymphocytes Relative: 25 %
Lymphs Abs: 1.7 10*3/uL (ref 0.7–4.0)
MCH: 37.2 pg — ABNORMAL HIGH (ref 26.0–34.0)
MCHC: 32.9 g/dL (ref 30.0–36.0)
MCV: 113.1 fL — ABNORMAL HIGH (ref 78.0–100.0)
MONO ABS: 0.7 10*3/uL (ref 0.1–1.0)
MONOS PCT: 10 %
NEUTROS PCT: 61 %
Neutro Abs: 4 10*3/uL (ref 1.7–7.7)
PLATELETS: 137 10*3/uL — AB (ref 150–400)
RBC: 3.6 MIL/uL — AB (ref 4.22–5.81)
RDW: 15.3 % (ref 11.5–15.5)
Smear Review: ADEQUATE
WBC: 6.7 10*3/uL (ref 4.0–10.5)

## 2017-08-10 LAB — BASIC METABOLIC PANEL
Anion gap: 14 (ref 5–15)
BUN: 28 mg/dL — AB (ref 6–20)
CHLORIDE: 96 mmol/L — AB (ref 101–111)
CO2: 23 mmol/L (ref 22–32)
CREATININE: 1.52 mg/dL — AB (ref 0.61–1.24)
Calcium: 8.3 mg/dL — ABNORMAL LOW (ref 8.9–10.3)
GFR, EST AFRICAN AMERICAN: 57 mL/min — AB (ref >60)
GFR, EST NON AFRICAN AMERICAN: 50 mL/min — AB (ref >60)
Glucose, Bld: 284 mg/dL — ABNORMAL HIGH (ref 65–99)
POTASSIUM: 3.7 mmol/L (ref 3.5–5.1)
SODIUM: 133 mmol/L — AB (ref 135–145)

## 2017-08-10 LAB — GLUCOSE, CAPILLARY
Glucose-Capillary: 202 mg/dL — ABNORMAL HIGH (ref 65–99)
Glucose-Capillary: 140 mg/dL — ABNORMAL HIGH (ref 65–99)
Glucose-Capillary: 189 mg/dL — ABNORMAL HIGH (ref 65–99)
Glucose-Capillary: 153 mg/dL — ABNORMAL HIGH (ref 65–99)
Glucose-Capillary: 226 mg/dL — ABNORMAL HIGH (ref 65–99)

## 2017-08-10 SURGERY — ECHOCARDIOGRAM, TRANSESOPHAGEAL
Anesthesia: Monitor Anesthesia Care

## 2017-08-10 MED ORDER — PROPOFOL 500 MG/50ML IV EMUL
INTRAVENOUS | Status: DC | PRN
  Administered 2017-08-10: 25 ug/kg/min via INTRAVENOUS

## 2017-08-10 MED ORDER — METOLAZONE 5 MG PO TABS
2.5000 mg | ORAL_TABLET | Freq: Once | ORAL | Status: AC
  Administered 2017-08-10: 2.5 mg via ORAL
  Filled 2017-08-10: qty 1

## 2017-08-10 MED ORDER — GUAIFENESIN-DM 100-10 MG/5ML PO SYRP
5.0000 mL | ORAL_SOLUTION | ORAL | Status: DC | PRN
  Administered 2017-08-10 – 2017-08-12 (×3): 5 mL via ORAL
  Filled 2017-08-10 (×4): qty 5

## 2017-08-10 MED ORDER — APIXABAN 5 MG PO TABS
5.0000 mg | ORAL_TABLET | Freq: Two times a day (BID) | ORAL | Status: DC
  Administered 2017-08-10 – 2017-08-17 (×14): 5 mg via ORAL
  Filled 2017-08-10 (×15): qty 1

## 2017-08-10 MED ORDER — SPIRONOLACTONE 12.5 MG HALF TABLET
12.5000 mg | ORAL_TABLET | Freq: Every day | ORAL | Status: DC
Start: 2017-08-10 — End: 2017-08-11
  Administered 2017-08-10: 12.5 mg via ORAL
  Filled 2017-08-10 (×2): qty 1

## 2017-08-10 MED ORDER — SODIUM CHLORIDE 0.9 % IV SOLN
INTRAVENOUS | Status: DC | PRN
  Administered 2017-08-10: 14:00:00 via INTRAVENOUS

## 2017-08-10 MED ORDER — GLYCOPYRROLATE 0.2 MG/ML IJ SOLN
INTRAMUSCULAR | Status: DC | PRN
  Administered 2017-08-10: 0.1 mg via INTRAVENOUS
  Administered 2017-08-10: 0.2 mg via INTRAVENOUS

## 2017-08-10 MED ORDER — KETAMINE HCL-SODIUM CHLORIDE 100-0.9 MG/10ML-% IV SOSY
PREFILLED_SYRINGE | INTRAVENOUS | Status: AC
  Filled 2017-08-10: qty 10

## 2017-08-10 MED ORDER — PROPOFOL 10 MG/ML IV BOLUS
INTRAVENOUS | Status: DC | PRN
  Administered 2017-08-10: 20 mg via INTRAVENOUS

## 2017-08-10 MED ORDER — POTASSIUM CHLORIDE CRYS ER 20 MEQ PO TBCR
40.0000 meq | EXTENDED_RELEASE_TABLET | Freq: Once | ORAL | Status: AC
  Administered 2017-08-10: 40 meq via ORAL
  Filled 2017-08-10: qty 2

## 2017-08-10 MED ORDER — KETAMINE HCL 10 MG/ML IJ SOLN
INTRAMUSCULAR | Status: DC | PRN
  Administered 2017-08-10 (×3): 10 mg via INTRAVENOUS

## 2017-08-10 MED ORDER — LIDOCAINE HCL (CARDIAC) 20 MG/ML IV SOLN
INTRAVENOUS | Status: DC | PRN
  Administered 2017-08-10: 100 mg via INTRAVENOUS

## 2017-08-10 NOTE — Progress Notes (Signed)
Patient ID: Vincent Chandler, male   DOB: [birth year], 57 y.o.   MRN: [id number]     Advanced Heart Failure Rounding Note  PCP-Cardiologist: Dr. [name]  Subjective:    PICC placed yesterday, co-ox 42%.  Started on milrinone 0.25, co-ox up to 62% but then back to 54% this morning.    Creatinine 1.6 => 1.5. UOP was not good yesterday. CVP remains 14.    He is in atrial flutter, rate in 100s-110s.   No chest pain or dyspnea.    Echo: EF 15%, mildly dilated LV, mild LVH, inferolateral akinesis, milr MR, mildly dilated RV with severely reduced systolic function.    Objective:   Weight Range: 209 lb 14.1 oz (95.2 kg) Body mass index is 30.11 kg/m.   Vital Signs:   Temp:  [97.6 F (36.4 C)-98.2 F (36.8 C)] 98.1 F (36.7 C) (02/08 0500) Pulse Rate:  [58-113] 113 (02/08 0500) Resp:  [16-20] 18 (02/08 0500) BP: (87-121)/(59-89) 121/75 (02/08 0500) SpO2:  [83 %-100 %] 100 % (02/08 0727) Weight:  [209 lb 14.1 oz (95.2 kg)] 209 lb 14.1 oz (95.2 kg) (02/08 0500) Last BM Date: 08/08/17  Weight change: Filed Weights   08/08/17 1957 08/09/17 0540 08/10/17 0500  Weight: 210 lb 6.4 oz (95.4 kg) 209 lb 1.6 oz (94.8 kg) 209 lb 14.1 oz (95.2 kg)    Intake/Output:   Intake/Output Summary (Last 24 hours) at 08/10/2017 0734 Last data filed at 08/10/2017 0700 Gross per 24 hour  Intake 1481.49 ml  Output 875 ml  Net 606.49 ml      Physical Exam    General: NAD Neck: JVP 12-14, no thyromegaly or thyroid nodule.  Lungs: Clear to auscultation bilaterally with normal respiratory effort. CV: Lateral PMI.  Heart irregular S1/S2, no S3/S4, no murmur.  Trace ankle edema.   Abdomen: Soft, nontender, no hepatosplenomegaly, no distention.  Skin: Intact without lesions or rashes.  Neurologic: Alert and oriented x 3.  Psych: Normal affect. Extremities: No clubbing or cyanosis.  HEENT: Normal.   Telemetry   Atrial flutter 100s-110s, personally reviewed  Labs    CBC Recent Labs     08/09/17 0420 08/10/17 0540  WBC 5.8 6.7  NEUTROABS 3.3 4.0  HGB 14.5 13.4  HCT 45.2 40.7  MCV 114.7* 113.1*  PLT 160 166*   Basic Metabolic Panel Recent Labs    08/07/17 1845  08/09/17 0420 08/10/17 0540  NA  --    < > 138 133*  K  --    < > 4.3 3.7  CL  --    < > 98* 96*  CO2  --    < > 25 23  GLUCOSE  --    < > 216* 284*  BUN  --    < > 24* 28*  CREATININE  --    < > 1.64* 1.52*  CALCIUM  --    < > 9.0 8.3*  MG 1.6*  --   --   --    < > = values in this interval not displayed.   Liver Function Tests Recent Labs    08/07/17 1847  AST 32  ALT 24  ALKPHOS 78  BILITOT 0.9  PROT 7.6  ALBUMIN 3.7   No results for input(s): LIPASE, AMYLASE in the last 72 hours. Cardiac Enzymes Recent Labs    08/08/17 2240 08/09/17 0420 08/09/17 0916  TROPONINI 0.05* 0.06* 0.04*    BNP: BNP (last 3 results) Recent Labs    08/07/17  1849  BNP 423.7*    ProBNP (last 3 results) No results for input(s): PROBNP in the last 8760 hours.   D-Dimer No results for input(s): DDIMER in the last 72 hours. Hemoglobin A1C No results for input(s): HGBA1C in the last 72 hours. Fasting Lipid Panel No results for input(s): CHOL, HDL, LDLCALC, TRIG, CHOLHDL, LDLDIRECT in the last 72 hours. Thyroid Function Tests Recent Labs    08/08/17 2240  TSH 1.801    Other results:   Imaging    Dg Chest 1v Repeat Same Day  Result Date: 08/09/2017 CLINICAL DATA:  PICC line placement. EXAM: CHEST - 1 VIEW SAME DAY COMPARISON:  Radiograph of same day. FINDINGS: Stable cardiomediastinal silhouette. Status post coronary artery bypass graft. Single lead left-sided pacemaker is unchanged in position. No pneumothorax or pleural effusion is noted. Stable bibasilar subsegmental atelectasis or scarring is noted. Continued presence of right-sided PICC line with distal tip in expected position of cavoatrial junction. IMPRESSION: Continued presence of right-sided PICC line with distal tip in expected  position of cavoatrial junction. Electronically Signed   By: Marijo Conception, M.D.   On: 08/09/2017 14:52   Dg Chest Port 1 View  Result Date: 08/09/2017 CLINICAL DATA:  Central line placement EXAM: PORTABLE CHEST 1 VIEW COMPARISON:  08/07/2017 FINDINGS: Interval placement of right-sided PICC, with the tip in the right atrium. Cardiac enlargement with CABG changes. AICD unchanged. Vascular congestion and mild edema. Small pleural effusions. Mild bibasilar atelectasis IMPRESSION: PICC tip in the right atrium Congestive heart failure with mild interval improvement Electronically Signed   By: Franchot Gallo M.D.   On: 08/09/2017 13:43     Medications:     Scheduled Medications: . apixaban  5 mg Oral 2 times per day on Fri  . atorvastatin  40 mg Oral QHS  . carvedilol  3.125 mg Oral BID  . digoxin  0.125 mg Oral Daily  . furosemide  80 mg Intravenous BID  . Influenza vac split quadrivalent PF  0.5 mL Intramuscular Tomorrow-1000  . insulin aspart  0-15 Units Subcutaneous TID WC  . insulin aspart  0-5 Units Subcutaneous QHS  . losartan  12.5 mg Oral QPC breakfast  . metolazone  2.5 mg Oral Once  . mometasone-formoterol  2 puff Inhalation BID  . pneumococcal 23 valent vaccine  0.5 mL Intramuscular Tomorrow-1000  . potassium chloride  40 mEq Oral Once  . sodium chloride flush  10-40 mL Intracatheter Q12H  . sodium chloride flush  3 mL Intravenous Q12H  . spironolactone  12.5 mg Oral Daily    Infusions: . sodium chloride    . sodium chloride    . amiodarone 30 mg/hr (08/10/17 0528)  . milrinone 0.25 mcg/kg/min (08/10/17 0529)    PRN Medications: sodium chloride, acetaminophen, guaiFENesin, nitroGLYCERIN, ondansetron (ZOFRAN) IV, sodium chloride, sodium chloride flush, sodium chloride flush    Patient Profile   Vincent Chandler is a 57 y.o. male with history of CAD s/p CABG, ischemic cardiomyopathy, prior VT, and medical noncompliance due to lack of insurance presents with acute on  chronic systolic CHF in setting of atrial flutter with RVR.    Assessment/Plan   1. Atrial flutter: Prior history of atrial fibrillation post-op CABG.  I suspect CHF exacerbation has been triggered by the onset of atrial flutter.  Based on symptoms, he has probably been in flutter > 1 week.  Rate now reasonably controlled on amiodarone.  - Continue amiodarone gtt for rate control. - Continue Eliquis 5 mg  bid.   - I think that he needs to get out of atrial flutter due to decompensated HF.  After 5th dose Eliquis (this afternoon), will plan TEE-guided DCCV to get him back in NSR.  Would also consider atrial flutter ablation down the road.  2. Acute on chronic systolic CHF: Echo in 8/76 with EF 25-30%.  Ischemic cardiomyopathy. [place].  Echo this admission with EF 15%, mildly dilated RV with severely decreased systolic function.  Suspect exacerbation triggered by combination of atrial flutter and no meds x several months.  No chest pain, doubt ACS.  He is still volume overloaded on exam with CVP 14.  Milrinone started yesterday evening with low output by co-ox.  He still has not diuresed well, creatinine down 1.64 => 1.5 however. Co-ox 54% today, was 42% pre-milrinone then up to 62% but lower this am.   - Continue milrinone 0.25, will check co-ox again this morning now that he is awake.  - Losartan 12.5 mg daily (soft BP when Entresto started, had not been taking this at home x months). - Continue digoxin 0.125 daily.  - Continue Coreg 3.125 mg bid for now. - Can add spironolactone 12.5 daily.   - Lasix 80 mg IV bid today with metolazone 2.5 mg x 1 this morning.  - Low output is concerning.  LVAD would be a consideration but will need to review RV function and also compliance is an issue though this seems more due to lack of insurance pre-disability.  3. CAD: s/p CABG.  1/16 cath with PCI to mLCx/PLOM.  No chest pain, doubt ACS.  Mild troponin elevation with no trend is likely demand  ischemia from volume overload/tachycardia.  - Continue statin.  - Will be on Eliquis so will not restart ASA and Plavix (has not taken x at least 2 months).  4.CKD: Creatinine 1.4 => 1.64 => 1.5.  Follow closely with diuresis.  5. He will need help with his medications, involve HF navigator.   Loralie Champagne 08/10/2017 7:34 AM

## 2017-08-10 NOTE — Transfer of Care (Signed)
Immediate Anesthesia Transfer of Care Note  Patient: Vincent Chandler  Procedure(s) Performed: TRANSESOPHAGEAL ECHOCARDIOGRAM (TEE) (N/A ) CARDIOVERSION (N/A )  Patient Location: Endoscopy Unit  Anesthesia Type:MAC  Level of Consciousness: awake, alert  and oriented  Airway & Oxygen Therapy: Patient Spontanous Breathing and Patient connected to nasal cannula oxygen  Post-op Assessment: Report given to RN and Post -op Vital signs reviewed and stable  Post vital signs: Reviewed and stable  Last Vitals:  Vitals:   08/10/17 1205 08/10/17 1259  BP: 100/74 91/62  Pulse:  100  Resp:  (!) 21  Temp: 36.5 C 36.6 C  SpO2: 99% 98%    Last Pain:  Vitals:   08/10/17 1259  TempSrc: Oral         Complications: No apparent anesthesia complications

## 2017-08-10 NOTE — Care Management Important Message (Signed)
Important Message  Patient Details  Name: Vincent Chandler MRN: [id number] Date of Birth: 11/16/1960   Medicare Important Message Given:  Yes    Lorin Hauck 08/10/2017, 11:34 AM

## 2017-08-10 NOTE — Anesthesia Preprocedure Evaluation (Signed)
Anesthesia Evaluation  Patient identified by MRN, date of birth, ID band Patient awake    Reviewed: Allergy & Precautions, H&P , NPO status , Patient's Chart, lab work & pertinent test results  Airway Mallampati: III   Neck ROM: Full  Mouth opening: Limited Mouth Opening  Dental no notable dental hx.    Pulmonary neg pulmonary ROS, asthma , sleep apnea ,    Pulmonary exam normal        Cardiovascular hypertension, + angina + CAD and +CHF  negative cardio ROS   Rhythm:Regular Rate:Normal  The estimated ejection fraction   was 15%. Diffuse hypokinesis. There is akinesis of the   inferolateral myocardium.  Currently in hospital on Milrinone and amiodarone drip   Neuro/Psych negative neurological ROS  negative psych ROS   GI/Hepatic negative GI ROS, Neg liver ROS,   Endo/Other  negative endocrine ROSdiabetes  Renal/GU negative Renal ROS  negative genitourinary   Musculoskeletal negative musculoskeletal ROS (+)   Abdominal   Peds  Hematology negative hematology ROS (+)   Anesthesia Other Findings   Reproductive/Obstetrics negative OB ROS                             Anesthesia Physical Anesthesia Plan  ASA: IV  Anesthesia Plan: MAC   Post-op Pain Management:    Induction:   PONV Risk Score and Plan: Treatment may vary due to age or medical condition  Airway Management Planned: Mask, Natural Airway and Nasal Cannula  Additional Equipment:   Intra-op Plan:   Post-operative Plan: Extubation in OR  Informed Consent: I have reviewed the patients History and Physical, chart, labs and discussed the procedure including the risks, benefits and alternatives for the proposed anesthesia with the patient or authorized representative who has indicated his/her understanding and acceptance.     Plan Discussed with: CRNA  Anesthesia Plan Comments:         Anesthesia Quick  Evaluation

## 2017-08-10 NOTE — CV Procedure (Signed)
Procedure: TEE  Indication: Atrial fibrillation  Sedation: Per anesthesiology  Findings: Please see echo section for full report.  Mildly dilated LV with EF 15%, diffuse hypokinesis.  Mildly dilated RV with severely decreased systolic function.  Moderate right atrial enlargement. PICC tip seen in RA.  Moderate left atrial enlargement, no LA appendage thrombus. No ASD/PFO by color doppler.  Trileaflet aortic valve with no stenosis or regurgitation.  Mild to moderate MR.  Trivial TR.  Normal caliber ascending aorta with grade III plaque in the descending thoracic aorta.   May proceed to DCCV.   Vincent Chandler 08/10/2017 2:53 PM

## 2017-08-10 NOTE — Progress Notes (Signed)
Discussed medications with HF navigator, Daphne. He will be referred to HF SW to help him get Medicare part D for drug coverage. He will be able to get eliquis samples and then patient assistance for eliquis. He may also need patient assistance for entresto, but will have 30 day free card. HF team will be able to get him his DC meds from OP pharmacy on Monday.   Pt will also need to get a 3rd dose of eliquis tonight to cover him after DC-CV. Discussed with PharmD and Dr [name].

## 2017-08-10 NOTE — Progress Notes (Signed)
  Echocardiogram Echocardiogram Transesophageal has been performed.  Vincent Chandler 08/10/2017, 3:25 PM

## 2017-08-10 NOTE — Procedures (Signed)
Electrical Cardioversion Procedure Note Shourya Macpherson [id number] [birth year]  Procedure: Electrical Cardioversion Indications:  Atrial flutter  Procedure Details Consent: Risks of procedure as well as the alternatives and risks of each were explained to the (patient/caregiver).  Consent for procedure obtained. Time Out: Verified patient identification, verified procedure, site/side was marked, verified correct patient position, special equipment/implants available, medications/allergies/relevent history reviewed, required imaging and test results available.  Performed  Patient placed on cardiac monitor, pulse oximetry, supplemental oxygen as necessary.  Sedation given: Per anesthesiology Pacer pads placed anterior and posterior chest.  Cardioverted 1 time(s).  Cardioverted at [place].  Evaluation Findings: Post procedure EKG shows: NSR Complications: None Patient did tolerate procedure well.   Loralie Champagne 08/10/2017, 2:53 PM

## 2017-08-11 LAB — BASIC METABOLIC PANEL
ANION GAP: 13 (ref 5–15)
BUN: 33 mg/dL — ABNORMAL HIGH (ref 6–20)
CALCIUM: 8.8 mg/dL — AB (ref 8.9–10.3)
CO2: 25 mmol/L (ref 22–32)
Chloride: 95 mmol/L — ABNORMAL LOW (ref 101–111)
Creatinine, Ser: 1.79 mg/dL — ABNORMAL HIGH (ref 0.61–1.24)
GFR calc Af Amer: 47 mL/min — ABNORMAL LOW (ref >60)
GFR, EST NON AFRICAN AMERICAN: 41 mL/min — AB (ref >60)
GLUCOSE: 188 mg/dL — AB (ref 65–99)
Potassium: 4 mmol/L (ref 3.5–5.1)
SODIUM: 133 mmol/L — AB (ref 135–145)

## 2017-08-11 LAB — GLUCOSE, CAPILLARY
Glucose-Capillary: 184 mg/dL — ABNORMAL HIGH (ref 65–99)
Glucose-Capillary: 216 mg/dL — ABNORMAL HIGH (ref 65–99)
Glucose-Capillary: 173 mg/dL — ABNORMAL HIGH (ref 65–99)
Glucose-Capillary: 175 mg/dL — ABNORMAL HIGH (ref 65–99)

## 2017-08-11 LAB — MAGNESIUM: Magnesium: 1.9 mg/dL (ref 1.7–2.4)

## 2017-08-11 LAB — COOXEMETRY PANEL
Carboxyhemoglobin: 1.9 % — ABNORMAL HIGH (ref 0.5–1.5)
Methemoglobin: 0.9 % (ref 0.0–1.5)
O2 Saturation: 65.4 %
TOTAL HEMOGLOBIN: 14.1 g/dL (ref 12.0–16.0)

## 2017-08-11 LAB — CBC WITH DIFFERENTIAL/PLATELET
BASOS ABS: 0 10*3/uL (ref 0.0–0.1)
Basophils Relative: 0 %
EOS ABS: 0.2 10*3/uL (ref 0.0–0.7)
Eosinophils Relative: 3 %
HCT: 41 % (ref 39.0–52.0)
Hemoglobin: 13.5 g/dL (ref 13.0–17.0)
LYMPHS ABS: 1.6 10*3/uL (ref 0.7–4.0)
LYMPHS PCT: 22 %
MCH: 37 pg — AB (ref 26.0–34.0)
MCHC: 32.9 g/dL (ref 30.0–36.0)
MCV: 112.3 fL — ABNORMAL HIGH (ref 78.0–100.0)
Monocytes Absolute: 0.8 10*3/uL (ref 0.1–1.0)
Monocytes Relative: 11 %
NEUTROS ABS: 4.7 10*3/uL (ref 1.7–7.7)
Neutrophils Relative %: 64 %
PLATELETS: 149 10*3/uL — AB (ref 150–400)
RBC: 3.65 MIL/uL — ABNORMAL LOW (ref 4.22–5.81)
RDW: 15 % (ref 11.5–15.5)
WBC: 7.3 10*3/uL (ref 4.0–10.5)

## 2017-08-11 MED ORDER — FUROSEMIDE 10 MG/ML IJ SOLN
10.0000 mg/h | INTRAVENOUS | Status: DC
  Administered 2017-08-11 – 2017-08-12 (×2): 10 mg/h via INTRAVENOUS
  Filled 2017-08-11 (×2): qty 20
  Filled 2017-08-11 (×3): qty 25

## 2017-08-11 MED ORDER — MAGNESIUM SULFATE 2 GM/50ML IV SOLN
2.0000 g | Freq: Once | INTRAVENOUS | Status: AC
  Administered 2017-08-11: 2 g via INTRAVENOUS
  Filled 2017-08-11: qty 50

## 2017-08-11 MED ORDER — SPIRONOLACTONE 25 MG PO TABS
25.0000 mg | ORAL_TABLET | Freq: Every day | ORAL | Status: DC
  Administered 2017-08-11 – 2017-08-17 (×7): 25 mg via ORAL
  Filled 2017-08-11 (×7): qty 1

## 2017-08-11 MED ORDER — METOLAZONE 5 MG PO TABS
2.5000 mg | ORAL_TABLET | Freq: Once | ORAL | Status: AC
  Administered 2017-08-11: 2.5 mg via ORAL
  Filled 2017-08-11: qty 1

## 2017-08-11 MED ORDER — FUROSEMIDE 10 MG/ML IJ SOLN
40.0000 mg | Freq: Once | INTRAMUSCULAR | Status: AC
  Administered 2017-08-11: 40 mg via INTRAVENOUS
  Filled 2017-08-11: qty 4

## 2017-08-11 MED ORDER — POTASSIUM CHLORIDE CRYS ER 20 MEQ PO TBCR
40.0000 meq | EXTENDED_RELEASE_TABLET | Freq: Once | ORAL | Status: AC
  Administered 2017-08-11: 40 meq via ORAL
  Filled 2017-08-11: qty 2

## 2017-08-11 NOTE — Anesthesia Postprocedure Evaluation (Signed)
Anesthesia Post Note  Patient: Vincent Chandler  Procedure(s) Performed: TRANSESOPHAGEAL ECHOCARDIOGRAM (TEE) (N/A ) CARDIOVERSION (N/A )     Patient location during evaluation: PACU Anesthesia Type: MAC Level of consciousness: awake and alert Pain management: pain level controlled Vital Signs Assessment: post-procedure vital signs reviewed and stable Respiratory status: spontaneous breathing, nonlabored ventilation, respiratory function stable and patient connected to nasal cannula oxygen Cardiovascular status: stable and blood pressure returned to baseline Postop Assessment: no apparent nausea or vomiting Anesthetic complications: no    Last Vitals:  Vitals:   08/11/17 0347 08/11/17 0719  BP: 116/88   Pulse: 87   Resp: 17   Temp: 36.6 C   SpO2: 98% 97%    Last Pain:  Vitals:   08/11/17 0347  TempSrc: Oral  PainSc:                  [place] S

## 2017-08-11 NOTE — Progress Notes (Signed)
Patient ID: Vincent Chandler, male   DOB: [birth year], 57 y.o.   MRN: [id number]     Advanced Heart Failure Rounding Note  PCP-Cardiologist: Dr. [name]  Subjective:    PICC placed, co-ox 42%.  Started on milrinone 0.25 then increased to 0.375, co-ox up to 65% this morning.  Creatinine 1.6 => 1.5 => 1.79.  Some UOP yesterday but not vigorous.  CVP 17 today.    TEE-guided DCCV to NSR on 2/8, remains in NSR today.    Still with cough, exertional dyspnea.     Echo: EF 15%, mildly dilated LV, mild LVH, inferolateral akinesis, milr MR, mildly dilated RV with severely reduced systolic function.   TEE: Mildly dilated LV with EF 15%, diffuse hypokinesis, mildly dilated RV with severely decreased systolic function.     Objective:   Weight Range: 211 lb 3.2 oz (95.8 kg) Body mass index is 30.3 kg/m.   Vital Signs:   Temp:  [97.5 F (36.4 C)-98.2 F (36.8 C)] 97.9 F (36.6 C) (02/09 0347) Pulse Rate:  [78-100] 87 (02/09 0347) Resp:  [17-27] 17 (02/09 0347) BP: (82-117)/(51-94) 116/88 (02/09 0347) SpO2:  [96 %-99 %] 97 % (02/09 0719) Weight:  [211 lb 3.2 oz (95.8 kg)] 211 lb 3.2 oz (95.8 kg) (02/09 0347) Last BM Date: 08/10/17  Weight change: Filed Weights   08/09/17 0540 08/10/17 0500 08/11/17 0347  Weight: 209 lb 1.6 oz (94.8 kg) 209 lb 14.1 oz (95.2 kg) 211 lb 3.2 oz (95.8 kg)    Intake/Output:   Intake/Output Summary (Last 24 hours) at 08/11/2017 0731 Last data filed at 08/11/2017 0518 Gross per 24 hour  Intake 1597.24 ml  Output 2465 ml  Net -867.76 ml      Physical Exam    General: NAD Neck: JVP 12+ cm, no thyromegaly or thyroid nodule.  Lungs: Clear to auscultation bilaterally with normal respiratory effort. CV: Lateral PMI.  Heart regular S1/S2, no S3/S4, no murmur.  No peripheral edema.  Abdomen: Soft, nontender, no hepatosplenomegaly, no distention.  Skin: Intact without lesions or rashes.  Neurologic: Alert and oriented x 3.  Psych: Normal affect. Extremities: No  clubbing or cyanosis.  HEENT: Normal.    Telemetry   NSR 80s (personally reviewed)  Labs    CBC Recent Labs    08/10/17 0540 08/11/17 0503  WBC 6.7 7.3  NEUTROABS 4.0 4.7  HGB 13.4 13.5  HCT 40.7 41.0  MCV 113.1* 112.3*  PLT 137* 470*   Basic Metabolic Panel Recent Labs    08/10/17 0540 08/11/17 0503  NA 133* 133*  K 3.7 4.0  CL 96* 95*  CO2 23 25  GLUCOSE 284* 188*  BUN 28* 33*  CREATININE 1.52* 1.79*  CALCIUM 8.3* 8.8*  MG  --  1.9   Liver Function Tests No results for input(s): AST, ALT, ALKPHOS, BILITOT, PROT, ALBUMIN in the last 72 hours. No results for input(s): LIPASE, AMYLASE in the last 72 hours. Cardiac Enzymes Recent Labs    08/08/17 2240 08/09/17 0420 08/09/17 0916  TROPONINI 0.05* 0.06* 0.04*    BNP: BNP (last 3 results) Recent Labs    08/07/17 1849  BNP 423.7*    ProBNP (last 3 results) No results for input(s): PROBNP in the last 8760 hours.   D-Dimer No results for input(s): DDIMER in the last 72 hours. Hemoglobin A1C No results for input(s): HGBA1C in the last 72 hours. Fasting Lipid Panel No results for input(s): CHOL, HDL, LDLCALC, TRIG, CHOLHDL, LDLDIRECT in the last 72  hours. Thyroid Function Tests Recent Labs    08/08/17 2240  TSH 1.801    Other results:   Imaging    No results found.   Medications:     Scheduled Medications: . apixaban  5 mg Oral BID  . atorvastatin  40 mg Oral QHS  . carvedilol  3.125 mg Oral BID  . digoxin  0.125 mg Oral Daily  . furosemide  40 mg Intravenous Once  . Influenza vac split quadrivalent PF  0.5 mL Intramuscular Tomorrow-1000  . insulin aspart  0-15 Units Subcutaneous TID WC  . insulin aspart  0-5 Units Subcutaneous QHS  . metolazone  2.5 mg Oral Once  . mometasone-formoterol  2 puff Inhalation BID  . pneumococcal 23 valent vaccine  0.5 mL Intramuscular Tomorrow-1000  . potassium chloride  40 mEq Oral Once  . sodium chloride flush  10-40 mL Intracatheter Q12H  .  sodium chloride flush  3 mL Intravenous Q12H  . spironolactone  25 mg Oral Daily    Infusions: . sodium chloride    . sodium chloride    . amiodarone 30 mg/hr (08/11/17 0518)  . furosemide (LASIX) infusion    . magnesium sulfate 1 - 4 g bolus IVPB    . milrinone 0.375 mcg/kg/min (08/10/17 1610)    PRN Medications: sodium chloride, acetaminophen, guaiFENesin, guaiFENesin-dextromethorphan, nitroGLYCERIN, ondansetron (ZOFRAN) IV, sodium chloride, sodium chloride flush, sodium chloride flush    Patient Profile   Vincent Chandler is a 57 y.o. male with history of CAD s/p CABG, ischemic cardiomyopathy, prior VT, and medical noncompliance due to lack of insurance presents with acute on chronic systolic CHF in setting of atrial flutter with RVR.    Assessment/Plan   1. Atrial flutter: Prior history of atrial fibrillation post-op CABG.  I suspect CHF exacerbation has been triggered by the onset of atrial flutter.  Based on symptoms, he has probably been in flutter > 1 week.  DCCV to NSR on 2/8.  - Continue amiodarone gtt while on IV milrinone. - Continue Eliquis 5 mg bid.   2. Acute on chronic systolic CHF: Echo in 0/93 with EF 25-30%.  Ischemic cardiomyopathy. [place].  Echo this admission with EF 15%, mildly dilated RV with severely decreased systolic function.  Suspect exacerbation triggered by combination of atrial flutter and no meds x several months.  No chest pain, doubt ACS.  He is still volume overloaded on exam with CVP 17 today, some diuresis yesterday but not marked.  Milrinone started and increased to 0.375, co-ox now 65%.  Hopefully now that he is back in NSR, he will diurese better.  Concerning that creatinine up to 1.79.    - Continue milrinone 0.375.  - Stop losartan with rise in creatinine, can increase spironolactone to 25 mg daily to keep K up.  - Continue digoxin 0.125 daily.  - Continue Coreg 3.125 mg bid for now. - Lasix 40 mg IV x 1 then start Lasix gtt at 10  mg/hr.  Will give metolazone 2.5 x 1.   - Low output is concerning.  LVAD would be a consideration but will need to review RV function and also compliance is an issue though this seems more due to lack of insurance pre-disability.  3. CAD: s/p CABG.  1/16 cath with PCI to mLCx/PLOM.  No chest pain, doubt ACS.  Mild troponin elevation with no trend is likely demand ischemia from volume overload/tachycardia.  - Continue statin.  - Will be on Eliquis so will not  restart ASA and Plavix (has not taken x at least 2 months).  4.CKD: Creatinine 1.4 => 1.64 => 1.5 => 1.79.  Follow closely with diuresis.  5. He will need help with his medications, involve HF navigator.   Loralie Champagne 08/11/2017 7:31 AM

## 2017-08-12 DIAGNOSIS — I5021 Acute systolic (congestive) heart failure: Secondary | ICD-10-CM

## 2017-08-12 LAB — CBC WITH DIFFERENTIAL/PLATELET
BASOS ABS: 0 10*3/uL (ref 0.0–0.1)
Basophils Relative: 0 %
EOS ABS: 0.2 10*3/uL (ref 0.0–0.7)
Eosinophils Relative: 3 %
HEMATOCRIT: 41.5 % (ref 39.0–52.0)
HEMOGLOBIN: 13.8 g/dL (ref 13.0–17.0)
LYMPHS PCT: 20 %
Lymphs Abs: 1.4 10*3/uL (ref 0.7–4.0)
MCH: 37 pg — ABNORMAL HIGH (ref 26.0–34.0)
MCHC: 33.3 g/dL (ref 30.0–36.0)
MCV: 111.3 fL — ABNORMAL HIGH (ref 78.0–100.0)
MONOS PCT: 17 %
Monocytes Absolute: 1.2 10*3/uL — ABNORMAL HIGH (ref 0.1–1.0)
NEUTROS PCT: 60 %
Neutro Abs: 4.2 10*3/uL (ref 1.7–7.7)
Platelets: 157 10*3/uL (ref 150–400)
RBC: 3.73 MIL/uL — AB (ref 4.22–5.81)
RDW: 14.6 % (ref 11.5–15.5)
SMEAR REVIEW: ADEQUATE
WBC: 7 10*3/uL (ref 4.0–10.5)

## 2017-08-12 LAB — COOXEMETRY PANEL
CARBOXYHEMOGLOBIN: 1.6 % — AB (ref 0.5–1.5)
METHEMOGLOBIN: 1.1 % (ref 0.0–1.5)
O2 SAT: 78.2 %
Total hemoglobin: 14.1 g/dL (ref 12.0–16.0)

## 2017-08-12 LAB — BASIC METABOLIC PANEL
ANION GAP: 14 (ref 5–15)
BUN: 43 mg/dL — ABNORMAL HIGH (ref 6–20)
CALCIUM: 9 mg/dL (ref 8.9–10.3)
CO2: 26 mmol/L (ref 22–32)
Chloride: 92 mmol/L — ABNORMAL LOW (ref 101–111)
Creatinine, Ser: 2.05 mg/dL — ABNORMAL HIGH (ref 0.61–1.24)
GFR, EST AFRICAN AMERICAN: 40 mL/min — AB (ref >60)
GFR, EST NON AFRICAN AMERICAN: 35 mL/min — AB (ref >60)
GLUCOSE: 195 mg/dL — AB (ref 65–99)
POTASSIUM: 4 mmol/L (ref 3.5–5.1)
Sodium: 132 mmol/L — ABNORMAL LOW (ref 135–145)

## 2017-08-12 LAB — GLUCOSE, CAPILLARY
Glucose-Capillary: 160 mg/dL — ABNORMAL HIGH (ref 65–99)
Glucose-Capillary: 194 mg/dL — ABNORMAL HIGH (ref 65–99)
Glucose-Capillary: 231 mg/dL — ABNORMAL HIGH (ref 65–99)
Glucose-Capillary: 178 mg/dL — ABNORMAL HIGH (ref 65–99)

## 2017-08-12 MED ORDER — POTASSIUM CHLORIDE CRYS ER 20 MEQ PO TBCR
40.0000 meq | EXTENDED_RELEASE_TABLET | Freq: Once | ORAL | Status: AC
  Administered 2017-08-12: 40 meq via ORAL
  Filled 2017-08-12: qty 2

## 2017-08-12 MED ORDER — METOLAZONE 5 MG PO TABS
2.5000 mg | ORAL_TABLET | Freq: Once | ORAL | Status: AC
  Administered 2017-08-12: 2.5 mg via ORAL
  Filled 2017-08-12: qty 1

## 2017-08-12 NOTE — Progress Notes (Signed)
Patient ID: Vincent Chandler, male   DOB: 08/19/1960, 57 y.o.   MRN: [id number]     Advanced Heart Failure Rounding Note  PCP-Cardiologist: Dr. [name]  Subjective:    PICC placed, co-ox 42%.  Started on milrinone 0.25 then increased to 0.375, co-ox up to 78% this morning.  Creatinine 1.6 => 1.5 => 1.79 => 2.05.  UOP better, weight down 4 lbs and CVP down to 13.     TEE-guided DCCV to NSR on 2/8, remains in NSR today.    Breathing improving, still with cough.      Echo: EF 15%, mildly dilated LV, mild LVH, inferolateral akinesis, milr MR, mildly dilated RV with severely reduced systolic function.   TEE: Mildly dilated LV with EF 15%, diffuse hypokinesis, mildly dilated RV with severely decreased systolic function.     Objective:   Weight Range: 207 lb 6.4 oz (94.1 kg) Body mass index is 29.76 kg/m.   Vital Signs:   Temp:  [97.8 F (36.6 C)-98.8 F (37.1 C)] 98 F (36.7 C) (02/10 0425) Pulse Rate:  [72-86] 76 (02/10 0425) Resp:  [17-18] 18 (02/10 0425) BP: (96-116)/(65-72) 96/66 (02/10 0425) SpO2:  [92 %-98 %] 92 % (02/10 0425) Weight:  [207 lb 6.4 oz (94.1 kg)] 207 lb 6.4 oz (94.1 kg) (02/10 0425) Last BM Date: 08/11/17  Weight change: Filed Weights   08/10/17 0500 08/11/17 0347 08/12/17 0425  Weight: 209 lb 14.1 oz (95.2 kg) 211 lb 3.2 oz (95.8 kg) 207 lb 6.4 oz (94.1 kg)    Intake/Output:   Intake/Output Summary (Last 24 hours) at 08/12/2017 0739 Last data filed at 08/12/2017 9323 Gross per 24 hour  Intake 1399.92 ml  Output 2875 ml  Net -1475.08 ml      Physical Exam    General: NAD Neck: JVP 12 cm, no thyromegaly or thyroid nodule.  Lungs: Clear to auscultation bilaterally with normal respiratory effort. CV: Nondisplaced PMI.  Heart regular S1/S2, no S3/S4, no murmur.  1+ ankle edema.    Abdomen: Soft, nontender, no hepatosplenomegaly, no distention.  Skin: Intact without lesions or rashes.  Neurologic: Alert and oriented x 3.  Psych: Normal  affect. Extremities: No clubbing or cyanosis.  HEENT: Normal.    Telemetry   NSR 70s (personally reviewed)  Labs    CBC Recent Labs    08/11/17 0503 08/12/17 0432  WBC 7.3 7.0  NEUTROABS 4.7 4.2  HGB 13.5 13.8  HCT 41.0 41.5  MCV 112.3* 111.3*  PLT 149* 557   Basic Metabolic Panel Recent Labs    08/11/17 0503 08/12/17 0432  NA 133* 132*  K 4.0 4.0  CL 95* 92*  CO2 25 26  GLUCOSE 188* 195*  BUN 33* 43*  CREATININE 1.79* 2.05*  CALCIUM 8.8* 9.0  MG 1.9  --    Liver Function Tests No results for input(s): AST, ALT, ALKPHOS, BILITOT, PROT, ALBUMIN in the last 72 hours. No results for input(s): LIPASE, AMYLASE in the last 72 hours. Cardiac Enzymes Recent Labs    08/09/17 0916  TROPONINI 0.04*    BNP: BNP (last 3 results) Recent Labs    08/07/17 1849  BNP 423.7*    ProBNP (last 3 results) No results for input(s): PROBNP in the last 8760 hours.   D-Dimer No results for input(s): DDIMER in the last 72 hours. Hemoglobin A1C No results for input(s): HGBA1C in the last 72 hours. Fasting Lipid Panel No results for input(s): CHOL, HDL, LDLCALC, TRIG, CHOLHDL, LDLDIRECT in the last  72 hours. Thyroid Function Tests No results for input(s): TSH, T4TOTAL, T3FREE, THYROIDAB in the last 72 hours.  Invalid input(s): FREET3  Other results:   Imaging    No results found.   Medications:     Scheduled Medications: . apixaban  5 mg Oral BID  . atorvastatin  40 mg Oral QHS  . digoxin  0.125 mg Oral Daily  . Influenza vac split quadrivalent PF  0.5 mL Intramuscular Tomorrow-1000  . insulin aspart  0-15 Units Subcutaneous TID WC  . insulin aspart  0-5 Units Subcutaneous QHS  . metolazone  2.5 mg Oral Once  . mometasone-formoterol  2 puff Inhalation BID  . pneumococcal 23 valent vaccine  0.5 mL Intramuscular Tomorrow-1000  . potassium chloride  40 mEq Oral Once  . sodium chloride flush  10-40 mL Intracatheter Q12H  . sodium chloride flush  3 mL  Intravenous Q12H  . spironolactone  25 mg Oral Daily    Infusions: . sodium chloride    . sodium chloride    . amiodarone 30 mg/hr (08/12/17 0417)  . furosemide (LASIX) infusion 10 mg/hr (08/11/17 1010)  . milrinone 0.375 mcg/kg/min (08/12/17 0503)    PRN Medications: sodium chloride, acetaminophen, guaiFENesin, guaiFENesin-dextromethorphan, nitroGLYCERIN, ondansetron (ZOFRAN) IV, sodium chloride, sodium chloride flush, sodium chloride flush    Patient Profile   Vincent Chandler is a 57 y.o. male with history of CAD s/p CABG, ischemic cardiomyopathy, prior VT, and medical noncompliance due to lack of insurance presents with acute on chronic systolic CHF in setting of atrial flutter with RVR.    Assessment/Plan   1. Atrial flutter: Prior history of atrial fibrillation post-op CABG.  I suspect CHF exacerbation has been triggered by the onset of atrial flutter.  Based on symptoms, he has probably been in flutter > 1 week.  DCCV to NSR on 2/8.  - Continue amiodarone gtt while on IV milrinone. - Continue Eliquis 5 mg bid.   2. Acute on chronic systolic CHF: Echo in 5/88 with EF 25-30%.  Ischemic cardiomyopathy. [place].  Echo this admission with EF 15%, mildly dilated RV with severely decreased systolic function.  Suspect exacerbation triggered by combination of atrial flutter and no meds x several months.  No chest pain, doubt ACS.  He is still volume overloaded on exam with CVP 13 today, better diuresis yesterday but creatinine higher at 2.  Milrinone started and increased to 0.375, co-ox now 78%.  I would like to get one more day of diuresis out of him.    - Continue milrinone 0.375.  - Stop Coreg to promote a higher BP.  - Continue digoxin 0.125 daily.  - Continue spironolactone 25 mg daily.  - Continue Lasix gtt at 10 mg/hr.  Will give metolazone 2.5 x 1 again today.   - Low output is concerning.  LVAD would be a consideration but will need to review RV function and also  compliance is an issue though this seems more due to lack of insurance pre-disability.  3. CAD: s/p CABG.  1/16 cath with PCI to mLCx/PLOM.  No chest pain, doubt ACS.  Mild troponin elevation with no trend is likely demand ischemia from volume overload/tachycardia.  - Continue statin.  - Will be on Eliquis so will not restart ASA and Plavix (has not taken x at least 2 months).  4. AKI on CKD: Creatinine 1.4 => 1.64 => 1.5 => 1.79 => 2.  Will try for one more day of diuresis.  5. He will  need help with his medications, involve HF navigator.   Loralie Champagne 08/12/2017 7:39 AM

## 2017-08-12 NOTE — Plan of Care (Signed)
Monitor CVP q 4 hours. Strict I's and O's.

## 2017-08-13 ENCOUNTER — Encounter (HOSPITAL_COMMUNITY): Payer: Self-pay | Admitting: Cardiology

## 2017-08-13 ENCOUNTER — Telehealth (HOSPITAL_COMMUNITY): Payer: Self-pay

## 2017-08-13 LAB — BASIC METABOLIC PANEL
ANION GAP: 16 — AB (ref 5–15)
BUN: 55 mg/dL — ABNORMAL HIGH (ref 6–20)
CALCIUM: 9.3 mg/dL (ref 8.9–10.3)
CO2: 26 mmol/L (ref 22–32)
Chloride: 91 mmol/L — ABNORMAL LOW (ref 101–111)
Creatinine, Ser: 2.26 mg/dL — ABNORMAL HIGH (ref 0.61–1.24)
GFR calc non Af Amer: 31 mL/min — ABNORMAL LOW (ref >60)
GFR, EST AFRICAN AMERICAN: 36 mL/min — AB (ref >60)
Glucose, Bld: 200 mg/dL — ABNORMAL HIGH (ref 65–99)
POTASSIUM: 4.1 mmol/L (ref 3.5–5.1)
SODIUM: 133 mmol/L — AB (ref 135–145)

## 2017-08-13 LAB — COOXEMETRY PANEL
CARBOXYHEMOGLOBIN: 1.6 % — AB (ref 0.5–1.5)
Methemoglobin: 1.1 % (ref 0.0–1.5)
O2 Saturation: 75.9 %
Total hemoglobin: 15.1 g/dL (ref 12.0–16.0)

## 2017-08-13 LAB — CBC WITH DIFFERENTIAL/PLATELET
BASOS ABS: 0 10*3/uL (ref 0.0–0.1)
BASOS PCT: 0 %
Eosinophils Absolute: 0.2 10*3/uL (ref 0.0–0.7)
Eosinophils Relative: 3 %
HEMATOCRIT: 43.6 % (ref 39.0–52.0)
HEMOGLOBIN: 14.8 g/dL (ref 13.0–17.0)
Lymphocytes Relative: 20 %
Lymphs Abs: 1.4 10*3/uL (ref 0.7–4.0)
MCH: 36.9 pg — ABNORMAL HIGH (ref 26.0–34.0)
MCHC: 33.9 g/dL (ref 30.0–36.0)
MCV: 108.7 fL — ABNORMAL HIGH (ref 78.0–100.0)
MONO ABS: 1.2 10*3/uL — AB (ref 0.1–1.0)
Monocytes Relative: 16 %
NEUTROS ABS: 4.4 10*3/uL (ref 1.7–7.7)
NEUTROS PCT: 61 %
Platelets: 189 10*3/uL (ref 150–400)
RBC: 4.01 MIL/uL — ABNORMAL LOW (ref 4.22–5.81)
RDW: 14.2 % (ref 11.5–15.5)
WBC: 7.1 10*3/uL (ref 4.0–10.5)

## 2017-08-13 LAB — GLUCOSE, CAPILLARY
Glucose-Capillary: 200 mg/dL — ABNORMAL HIGH (ref 65–99)
Glucose-Capillary: 227 mg/dL — ABNORMAL HIGH (ref 65–99)
Glucose-Capillary: 194 mg/dL — ABNORMAL HIGH (ref 65–99)
Glucose-Capillary: 241 mg/dL — ABNORMAL HIGH (ref 65–99)

## 2017-08-13 NOTE — Progress Notes (Signed)
CARDIAC REHAB PHASE I   PRE:  Rate/Rhythm: 82 SR    BP: sitting 125/79    SaO2: 93 RA  MODE:  Ambulation: 470 ft   POST:  Rate/Rhythm: 93 SR    BP: sitting 111/75     SaO2: 83 RA, up to 91 RA with pursed lip breathing  Pt able to walk without c/o, steady, no SOB. However SaO2 down after walking, 83 RA. Up to 90-91 RA with pursed lip breathing. I left him off O2 but encouraged him to keep up with pursed lip breathing. We discussed HF management. He had questions regarding VAD and was concerned. Will continue to follow for ambulation and education.   [id number]  [place], ACSM 08/13/2017 11:50 AM

## 2017-08-13 NOTE — Telephone Encounter (Signed)
Contacted pt regarding referral to paramedicine, he is still in the hosp currently--he seems very open to paramedicine visits and will continue to follow until he gets out of hosp and will make call to sch home visit. While on phone he asked how he can know what services he is eligible for- I advised him to ask his nurse for now and will continue this conversation at a home visit.   Marylouise Stacks, EMT-paramedic [phone] 08/13/17

## 2017-08-13 NOTE — Progress Notes (Signed)
Inpatient Diabetes Program Recommendations  AACE/ADA: New Consensus Statement on Inpatient Glycemic Control (2015)  Target Ranges:  Prepandial:   less than 140 mg/dL      Peak postprandial:   less than 180 mg/dL (1-2 hours)      Critically ill patients:  140 - 180 mg/dL   Lab Results  Component Value Date   GLUCAP 227 (H) 08/13/2017   HGBA1C 8.3 (H) 09/21/2015    Review of Glycemic Control Results for ALOYSUIS, RIBAUDO (MRN [id number]) as of 08/13/2017 14:04  Ref. Range 08/12/2017 21:03 08/13/2017 04:52 08/13/2017 05:00 08/13/2017 07:47 08/13/2017 11:44  Glucose-Capillary Latest Ref Range: 65 - 99 mg/dL 178 (H)   200 (H) 227 (H)   Diabetes history: none Outpatient Diabetes medications: none Current orders for Inpatient glycemic control: Novolog 0-15 Units TID, Novolog 0-5 Units QHS  Inpatient Diabetes Program Recommendations:    Consider adding Lantus 10 Units QHS and adding on a A1C (as most recent was from 2017).  Thanks, Bronson Curb, MSN, RNC-OB Diabetes Coordinator [phone] (8a-5p)

## 2017-08-13 NOTE — Progress Notes (Signed)
Patient ID: Vincent Chandler, male   DOB: [birth year], 57 y.o.   MRN: [id number]     Advanced Heart Failure Rounding Note  PCP-Cardiologist: Dr. [name]  Subjective:    PICC placed, co-ox 42%.  Started on milrinone 0.25 then increased to 0.375, co-ox up to 76% this morning.  Creatinine 1.6 => 1.5 => 1.79 => 2.05 => 2.26.  Weight down 5 lbs again and CVP around 10 this morning.     TEE-guided DCCV to NSR on 2/8, remains in NSR today.    Breathing improved overall.      Echo: EF 15%, mildly dilated LV, mild LVH, inferolateral akinesis, milr MR, mildly dilated RV with severely reduced systolic function.   TEE: Mildly dilated LV with EF 15%, diffuse hypokinesis, mildly dilated RV with severely decreased systolic function.     Objective:   Weight Range: 202 lb 9.6 oz (91.9 kg) Body mass index is 29.07 kg/m.   Vital Signs:   Temp:  [97.9 F (36.6 C)-98.4 F (36.9 C)] 98.1 F (36.7 C) (02/11 0431) Pulse Rate:  [73-89] 89 (02/11 0431) Resp:  [16-20] 17 (02/11 0431) BP: (111-120)/(63-72) 116/68 (02/11 0431) SpO2:  [95 %-99 %] 97 % (02/10 2334) Weight:  [202 lb 9.6 oz (91.9 kg)] 202 lb 9.6 oz (91.9 kg) (02/11 0431) Last BM Date: 08/12/17  Weight change: Filed Weights   08/11/17 0347 08/12/17 0425 08/13/17 0431  Weight: 211 lb 3.2 oz (95.8 kg) 207 lb 6.4 oz (94.1 kg) 202 lb 9.6 oz (91.9 kg)    Intake/Output:   Intake/Output Summary (Last 24 hours) at 08/13/2017 0734 Last data filed at 08/13/2017 0725 Gross per 24 hour  Intake 1963.59 ml  Output 3225 ml  Net -1261.41 ml      Physical Exam    General: NAD Neck: JVP 8-9 cm, no thyromegaly or thyroid nodule.  Lungs: Clear to auscultation bilaterally with normal respiratory effort. CV: Nondisplaced PMI.  Heart regular S1/S2, no S3/S4, no murmur.  No peripheral edema.   Abdomen: Soft, nontender, no hepatosplenomegaly, no distention.  Skin: Intact without lesions or rashes.  Neurologic: Alert and oriented x 3.  Psych: Normal  affect. Extremities: No clubbing or cyanosis.  HEENT: Normal.    Telemetry   NSR 70s (personally reviewed)  Labs    CBC Recent Labs    08/12/17 0432 08/13/17 0452  WBC 7.0 7.1  NEUTROABS 4.2 4.4  HGB 13.8 14.8  HCT 41.5 43.6  MCV 111.3* 108.7*  PLT 157 786   Basic Metabolic Panel Recent Labs    08/11/17 0503 08/12/17 0432 08/13/17 0452  NA 133* 132* 133*  K 4.0 4.0 4.1  CL 95* 92* 91*  CO2 _0 GLUCOSE 188* 195* 200*  BUN 33* 43* 55*  CREATININE 1.79* 2.05* 2.26*  CALCIUM 8.8* 9.0 9.3  MG 1.9  --   --    Liver Function Tests No results for input(s): AST, ALT, ALKPHOS, BILITOT, PROT, ALBUMIN in the last 72 hours. No results for input(s): LIPASE, AMYLASE in the last 72 hours. Cardiac Enzymes No results for input(s): CKTOTAL, CKMB, CKMBINDEX, TROPONINI in the last 72 hours.  BNP: BNP (last 3 results) Recent Labs    08/07/17 1849  BNP 423.7*    ProBNP (last 3 results) No results for input(s): PROBNP in the last 8760 hours.   D-Dimer No results for input(s): DDIMER in the last 72 hours. Hemoglobin A1C No results for input(s): HGBA1C in the last 72 hours. Fasting Lipid Panel  No results for input(s): CHOL, HDL, LDLCALC, TRIG, CHOLHDL, LDLDIRECT in the last 72 hours. Thyroid Function Tests No results for input(s): TSH, T4TOTAL, T3FREE, THYROIDAB in the last 72 hours.  Invalid input(s): FREET3  Other results:   Imaging    No results found.   Medications:     Scheduled Medications: . apixaban  5 mg Oral BID  . atorvastatin  40 mg Oral QHS  . digoxin  0.125 mg Oral Daily  . Influenza vac split quadrivalent PF  0.5 mL Intramuscular Tomorrow-1000  . insulin aspart  0-15 Units Subcutaneous TID WC  . insulin aspart  0-5 Units Subcutaneous QHS  . mometasone-formoterol  2 puff Inhalation BID  . pneumococcal 23 valent vaccine  0.5 mL Intramuscular Tomorrow-1000  . sodium chloride flush  10-40 mL Intracatheter Q12H  . sodium chloride flush   3 mL Intravenous Q12H  . spironolactone  25 mg Oral Daily    Infusions: . sodium chloride    . sodium chloride    . amiodarone 30 mg/hr (08/13/17 0516)  . milrinone 0.375 mcg/kg/min (08/12/17 2224)    PRN Medications: sodium chloride, acetaminophen, guaiFENesin, guaiFENesin-dextromethorphan, nitroGLYCERIN, ondansetron (ZOFRAN) IV, sodium chloride, sodium chloride flush, sodium chloride flush    Patient Profile   Vincent Chandler is a 57 y.o. male with history of CAD s/p CABG, ischemic cardiomyopathy, prior VT, and medical noncompliance due to lack of insurance presents with acute on chronic systolic CHF in setting of atrial flutter with RVR.    Assessment/Plan   1. Atrial flutter: Prior history of atrial fibrillation post-op CABG.  I suspect CHF exacerbation has been triggered by the onset of atrial flutter.  Based on symptoms, he has probably been in flutter > 1 week.  DCCV to NSR on 2/8.  - Continue amiodarone gtt while on IV milrinone. - Continue Eliquis 5 mg bid.   2. Acute on chronic systolic CHF: Echo in 7/49 with EF 25-30%.  Ischemic cardiomyopathy. [place].  Echo this admission with EF 15%, mildly dilated RV with severely decreased systolic function.  Suspect exacerbation triggered by combination of atrial flutter and no meds x several months.  No chest pain, doubt ACS.  Good diuresis with weight down, CVP down to around 10 but creatinine up again to 2.26.  Milrinone started and increased to 0.375, co-ox now 76%.  Unfortunately, diuresis appears to have been accompanied by progressive cardiorenal syndrome.  - Decrease milrinone to 0.25.  - Stop Lasix gtt today, hopefully creatinine will start to come down with holding diuretic for some equilibration.  Would like to start torsemide tomorrow.  - Continue digoxin 0.125 daily.  - Continue spironolactone 25 mg daily.    - Low output is concerning.  LVAD would be a consideration but will need to review RV function and also  compliance is an issue though this seems more due to lack of insurance pre-disability. Renal function will be a major issue as well.  3. CAD: s/p CABG.  1/16 cath with PCI to mLCx/PLOM.  No chest pain, doubt ACS.  Mild troponin elevation with no trend is likely demand ischemia from volume overload/tachycardia.  - Continue statin.  - Will be on Eliquis so will not restart ASA and Plavix (has not taken x at least 2 months).  4. AKI on CKD: Creatinine 1.4 => 1.64 => 1.5 => 1.79 => 2 => 2.26.  Hold diuresis today.   5. He will need help with his medications, involve HF navigator.   Estelle Greenleaf  [name] 08/13/2017 7:34 AM

## 2017-08-14 DIAGNOSIS — I4892 Unspecified atrial flutter: Secondary | ICD-10-CM

## 2017-08-14 LAB — BASIC METABOLIC PANEL
ANION GAP: 15 (ref 5–15)
BUN: 55 mg/dL — ABNORMAL HIGH (ref 6–20)
CHLORIDE: 97 mmol/L — AB (ref 101–111)
CO2: 23 mmol/L (ref 22–32)
Calcium: 8.6 mg/dL — ABNORMAL LOW (ref 8.9–10.3)
Creatinine, Ser: 1.82 mg/dL — ABNORMAL HIGH (ref 0.61–1.24)
GFR calc Af Amer: 46 mL/min — ABNORMAL LOW (ref >60)
GFR calc non Af Amer: 40 mL/min — ABNORMAL LOW (ref >60)
Glucose, Bld: 162 mg/dL — ABNORMAL HIGH (ref 65–99)
POTASSIUM: 3.7 mmol/L (ref 3.5–5.1)
Sodium: 135 mmol/L (ref 135–145)

## 2017-08-14 LAB — GLUCOSE, CAPILLARY
Glucose-Capillary: 197 mg/dL — ABNORMAL HIGH (ref 65–99)
Glucose-Capillary: 233 mg/dL — ABNORMAL HIGH (ref 65–99)
Glucose-Capillary: 215 mg/dL — ABNORMAL HIGH (ref 65–99)
Glucose-Capillary: 188 mg/dL — ABNORMAL HIGH (ref 65–99)

## 2017-08-14 LAB — COOXEMETRY PANEL
Carboxyhemoglobin: 1.2 % (ref 0.5–1.5)
Methemoglobin: 1.1 % (ref 0.0–1.5)
O2 SAT: 55 %
TOTAL HEMOGLOBIN: 16 g/dL (ref 12.0–16.0)

## 2017-08-14 MED ORDER — AMIODARONE HCL 200 MG PO TABS
400.0000 mg | ORAL_TABLET | Freq: Two times a day (BID) | ORAL | Status: DC
  Administered 2017-08-14 – 2017-08-16 (×6): 400 mg via ORAL
  Filled 2017-08-14 (×6): qty 2

## 2017-08-14 MED ORDER — ALTEPLASE 2 MG IJ SOLR
2.0000 mg | Freq: Once | INTRAMUSCULAR | Status: AC
  Administered 2017-08-14: 2 mg

## 2017-08-14 MED ORDER — TORSEMIDE 20 MG PO TABS
40.0000 mg | ORAL_TABLET | Freq: Every day | ORAL | Status: DC
  Administered 2017-08-14: 40 mg via ORAL
  Filled 2017-08-14: qty 2

## 2017-08-14 MED ORDER — LIVING BETTER WITH HEART FAILURE BOOK
Freq: Once | Status: AC
  Administered 2017-08-14: 14:00:00

## 2017-08-14 MED ORDER — POTASSIUM CHLORIDE CRYS ER 20 MEQ PO TBCR
40.0000 meq | EXTENDED_RELEASE_TABLET | Freq: Once | ORAL | Status: AC
  Administered 2017-08-14: 40 meq via ORAL
  Filled 2017-08-14: qty 2

## 2017-08-14 MED ORDER — LOSARTAN POTASSIUM 25 MG PO TABS
12.5000 mg | ORAL_TABLET | Freq: Every day | ORAL | Status: DC
  Administered 2017-08-14: 12.5 mg via ORAL
  Filled 2017-08-14: qty 1

## 2017-08-14 NOTE — Progress Notes (Signed)
Initial Encounter with LVAD Team and MCS Introduction:  Vincent Chandler is a 57 y.o. male whom  has a past medical history of Anginal pain ([place]), Anxiety, Asthma, Atrial fibrillation-postoperative (11/28/2012), Cardiomyopathy, CHF (congestive heart failure) ([place]), Chronic back pain, Coronary artery disease, Depression, Diabetes mellitus type II (dx'd in the 1990's), GERD (gastroesophageal reflux disease), H/O hiatal hernia, History of hypogonadism, Hyperlipidemia, Hypertension, Kidney stones, Migraines, OSA on CPAP, Pneumonia (3-4 times), and Urinary incontinence.. We have been asked to evaluate the patient for advanced therapies which include Left Ventricular Assist Device implantation.   VAD educational packet including "A Decision Aid for Left Ventricular Assist Device (LVAD) for Destination Therapy",  "Understanding Your Options with Advanced Heart Failure", "[facility] Patient Agreement for VAD Evaluation and Potential Implantation" consent, "[facility] HM II Patient Education" booklet, and [employer] "Living a More Active Life" HM III booklet reviewed in detail with wife and left at bedside for continued reference. HM III Patient education DVD was given to patient as well for reference should he want to see more about the equipment at home after reviewing the information I left them.   Explained that LVAD can be implanted for two indications in the setting of advanced left ventricular heart failure treatment:  Bridge to transplant - used for patients who cannot safely wait for heart transplant without this device.  Or   Destination therapy - used for patients until end of life or recovery of heart function.  Discussed that at this point [name] would be considered for Destination therapy should he be deemed an acceptable VAD candidate.   Provided brief equipment overview of the HeartMate III pump and discussed placement, surgical procedure, peripheral equipment, life-long coumadin therapy,  importance of medication adherence and clinic follow up for as long as patient is living on support, life-style modifications, as well as need for caregiver to be successful with this therapy.   The patient verbalized udnerstanding. We discussed the process of the evaluation period and how a decision was made by the [facility] team whether he would be an appropriate candidate for therapy or not. Evaluation consent was reviewed and given for reference while the patient makes his decision to proceed with candidacy evaluation.  Caregiver Support: one brother and a cousin, Judeen Hammans.  Home Inspection Checklist: verified that patient has reliable telephone, running water and electricity in the home.   Advised the patient review the materials, contact either myself or Zada Girt with questions and we will plan on meeting with him at next scheduled clinic appointment. Verbalized he would review the evaluation consent and make a decision regarding the evaluation process.   Follow-Up Plan: will arrange family meeting for tomorrow to further discuss LVAD  Balinda Quails RN, [place] Coordinator 24/7 pager [phone]

## 2017-08-14 NOTE — Progress Notes (Signed)
LVAD teaching being done. Pt sts he will ambulate later.  Yves Dill CES, ACSM 2:46 PM 08/14/2017

## 2017-08-14 NOTE — Progress Notes (Signed)
Patient ID: Vincent Chandler, male   DOB: 12-01-1960, 57 y.o.   MRN: [id number]     Advanced Heart Failure Rounding Note  PCP-Cardiologist: Dr. [name]  Subjective:    PICC placed, co-ox 42%.  Started on milrinone 0.25 then increased to 0.375, co-ox up to 76% this morning.  Creatinine 1.6 => 1.5 => 1.79 => 2.05 => 2.26 => 1.8.  Weight down 2 lbs again and CVP around 8-9 this morning.  Diuretics stopped yesterday morning.   TEE-guided DCCV to NSR on 2/8, remains in NSR today.    Breathing improved overall. Walked in hall with cardiac rehab yesterday.      Echo: EF 15%, mildly dilated LV, mild LVH, inferolateral akinesis, milr MR, mildly dilated RV with severely reduced systolic function.   TEE: Mildly dilated LV with EF 15%, diffuse hypokinesis, mildly dilated RV with severely decreased systolic function.     Objective:   Weight Range: 200 lb 11.2 oz (91 kg) Body mass index is 28.8 kg/m.   Vital Signs:   Temp:  [97.8 F (36.6 C)-98.2 F (36.8 C)] 97.8 F (36.6 C) (02/12 0624) Pulse Rate:  [74-79] 79 (02/12 0624) Resp:  [16-17] 17 (02/12 0624) BP: (111-125)/(70-81) 111/81 (02/12 0624) SpO2:  [95 %-100 %] 100 % (02/12 0624) Weight:  [200 lb 11.2 oz (91 kg)] 200 lb 11.2 oz (91 kg) (02/12 0624) Last BM Date: 08/12/17  Weight change: Filed Weights   08/12/17 0425 08/13/17 0431 08/14/17 0624  Weight: 207 lb 6.4 oz (94.1 kg) 202 lb 9.6 oz (91.9 kg) 200 lb 11.2 oz (91 kg)    Intake/Output:   Intake/Output Summary (Last 24 hours) at 08/14/2017 0830 Last data filed at 08/14/2017 6803 Gross per 24 hour  Intake 600 ml  Output 1030 ml  Net -430 ml      Physical Exam    General: NAD Neck: JVP 8 cm, no thyromegaly or thyroid nodule.  Lungs: Clear to auscultation bilaterally with normal respiratory effort. CV: Nondisplaced PMI.  Heart regular S1/S2, no S3/S4, no murmur.  No peripheral edema.   Abdomen: Soft, nontender, no hepatosplenomegaly, no distention.  Skin: Intact without  lesions or rashes.  Neurologic: Alert and oriented x 3.  Psych: Normal affect. Extremities: No clubbing or cyanosis.  HEENT: Normal.    Telemetry   NSR 70s (personally reviewed)  Labs    CBC Recent Labs    08/12/17 0432 08/13/17 0452  WBC 7.0 7.1  NEUTROABS 4.2 4.4  HGB 13.8 14.8  HCT 41.5 43.6  MCV 111.3* 108.7*  PLT 157 212   Basic Metabolic Panel Recent Labs    08/13/17 0452 08/14/17 0515  NA 133* 135  K 4.1 3.7  CL 91* 97*  CO2 26 23  GLUCOSE 200* 162*  BUN 55* 55*  CREATININE 2.26* 1.82*  CALCIUM 9.3 8.6*   Liver Function Tests No results for input(s): AST, ALT, ALKPHOS, BILITOT, PROT, ALBUMIN in the last 72 hours. No results for input(s): LIPASE, AMYLASE in the last 72 hours. Cardiac Enzymes No results for input(s): CKTOTAL, CKMB, CKMBINDEX, TROPONINI in the last 72 hours.  BNP: BNP (last 3 results) Recent Labs    08/07/17 1849  BNP 423.7*    ProBNP (last 3 results) No results for input(s): PROBNP in the last 8760 hours.   D-Dimer No results for input(s): DDIMER in the last 72 hours. Hemoglobin A1C No results for input(s): HGBA1C in the last 72 hours. Fasting Lipid Panel No results for input(s): CHOL, HDL, LDLCALC,  TRIG, CHOLHDL, LDLDIRECT in the last 72 hours. Thyroid Function Tests No results for input(s): TSH, T4TOTAL, T3FREE, THYROIDAB in the last 72 hours.  Invalid input(s): FREET3  Other results:   Imaging    No results found.   Medications:     Scheduled Medications: . apixaban  5 mg Oral BID  . atorvastatin  40 mg Oral QHS  . digoxin  0.125 mg Oral Daily  . Influenza vac split quadrivalent PF  0.5 mL Intramuscular Tomorrow-1000  . insulin aspart  0-15 Units Subcutaneous TID WC  . insulin aspart  0-5 Units Subcutaneous QHS  . losartan  12.5 mg Oral Daily  . mometasone-formoterol  2 puff Inhalation BID  . pneumococcal 23 valent vaccine  0.5 mL Intramuscular Tomorrow-1000  . potassium chloride  40 mEq Oral Once  .  sodium chloride flush  10-40 mL Intracatheter Q12H  . sodium chloride flush  3 mL Intravenous Q12H  . spironolactone  25 mg Oral Daily  . torsemide  40 mg Oral Daily    Infusions: . sodium chloride    . sodium chloride    . amiodarone 30 mg/hr (08/14/17 0520)  . milrinone 0.25 mcg/kg/min (08/13/17 2126)    PRN Medications: sodium chloride, acetaminophen, guaiFENesin, guaiFENesin-dextromethorphan, nitroGLYCERIN, ondansetron (ZOFRAN) IV, sodium chloride, sodium chloride flush, sodium chloride flush    Patient Profile   Vincent Chandler is a 57 y.o. male with history of CAD s/p CABG, ischemic cardiomyopathy, prior VT, and medical noncompliance due to lack of insurance presents with acute on chronic systolic CHF in setting of atrial flutter with RVR.    Assessment/Plan   1. Atrial flutter: Prior history of atrial fibrillation post-op CABG.  I suspect CHF exacerbation has been triggered by the onset of atrial flutter.  Based on symptoms, he had probably been in flutter > 1 week.  DCCV to NSR on 2/8.  - Continue amiodarone gtt while on IV milrinone. - Continue Eliquis 5 mg bid.   - Will ask EP if they would consider ablating his flutter.  2. Acute on chronic systolic CHF: Echo in 9/93 with EF 25-30%.  Ischemic cardiomyopathy. [place].  Echo this admission with EF 15%, mildly dilated RV with severely decreased systolic function.  Suspect exacerbation triggered by combination of atrial flutter and no meds x several months.  No chest pain, doubt ACS.  Good diuresis with weight down, CVP down to around 8-9 but creatinine up to 2.26 => down to 1.8 today after holding diuretics.  Milrinone started and increased to 0.375, now down to 0.25.  No co-ox yet today.   - Check co-ox today, if looks ok will wean down on milrinone again.  - Start torsemide 40 mg daily today.  - Continue digoxin 0.125 daily, check level in am.  - Continue spironolactone 25 mg daily.    - Will add low dose  losartan 12.5 mg daily.  - Low output is concerning.  LVAD would be a consideration but will need to review RV function and also compliance is an issue though this seems more due to lack of insurance pre-disability. Renal function will be a major issue as well. Will ask LVAD nurse to see.  3. CAD: s/p CABG.  1/16 cath with PCI to mLCx/PLOM.  No chest pain, doubt ACS.  Mild troponin elevation with no trend is likely demand ischemia from volume overload/tachycardia.  - Continue statin.  - Will be on Eliquis so will not restart ASA and Plavix (has not taken  x at least 2 months).  4. AKI on CKD: Creatinine 1.4 => 1.64 => 1.5 => 1.79 => 2 => 2.26 => 1.8.  Start torsemide today as above.   5. He will need help with his medications, involve HF navigator.   Loralie Champagne 08/14/2017 8:30 AM

## 2017-08-15 ENCOUNTER — Telehealth (HOSPITAL_COMMUNITY): Payer: Self-pay | Admitting: Surgery

## 2017-08-15 LAB — BASIC METABOLIC PANEL
ANION GAP: 14 (ref 5–15)
BUN: 67 mg/dL — ABNORMAL HIGH (ref 6–20)
CHLORIDE: 94 mmol/L — AB (ref 101–111)
CO2: 24 mmol/L (ref 22–32)
Calcium: 9.1 mg/dL (ref 8.9–10.3)
Creatinine, Ser: 2.44 mg/dL — ABNORMAL HIGH (ref 0.61–1.24)
GFR, EST AFRICAN AMERICAN: 32 mL/min — AB (ref >60)
GFR, EST NON AFRICAN AMERICAN: 28 mL/min — AB (ref >60)
Glucose, Bld: 165 mg/dL — ABNORMAL HIGH (ref 65–99)
POTASSIUM: 4.3 mmol/L (ref 3.5–5.1)
SODIUM: 132 mmol/L — AB (ref 135–145)

## 2017-08-15 LAB — CBC WITH DIFFERENTIAL/PLATELET
BASOS ABS: 0 10*3/uL (ref 0.0–0.1)
BASOS PCT: 0 %
Eosinophils Absolute: 0.2 10*3/uL (ref 0.0–0.7)
Eosinophils Relative: 4 %
HEMATOCRIT: 49 % (ref 39.0–52.0)
Hemoglobin: 16.6 g/dL (ref 13.0–17.0)
LYMPHS PCT: 25 %
Lymphs Abs: 1.5 10*3/uL (ref 0.7–4.0)
MCH: 37.1 pg — ABNORMAL HIGH (ref 26.0–34.0)
MCHC: 33.9 g/dL (ref 30.0–36.0)
MCV: 109.4 fL — ABNORMAL HIGH (ref 78.0–100.0)
Monocytes Absolute: 0.8 10*3/uL (ref 0.1–1.0)
Monocytes Relative: 14 %
NEUTROS ABS: 3.5 10*3/uL (ref 1.7–7.7)
Neutrophils Relative %: 57 %
PLATELETS: 196 10*3/uL (ref 150–400)
RBC: 4.48 MIL/uL (ref 4.22–5.81)
RDW: 14.1 % (ref 11.5–15.5)
WBC: 6.1 10*3/uL (ref 4.0–10.5)

## 2017-08-15 LAB — COOXEMETRY PANEL
Carboxyhemoglobin: 1.9 % — ABNORMAL HIGH (ref 0.5–1.5)
Methemoglobin: 0.7 % (ref 0.0–1.5)
O2 Saturation: 75 %
Total hemoglobin: 16.3 g/dL — ABNORMAL HIGH (ref 12.0–16.0)

## 2017-08-15 LAB — DIGOXIN LEVEL: DIGOXIN LVL: 0.2 ng/mL — AB (ref 0.8–2.0)

## 2017-08-15 LAB — GLUCOSE, CAPILLARY
Glucose-Capillary: 182 mg/dL — ABNORMAL HIGH (ref 65–99)
Glucose-Capillary: 192 mg/dL — ABNORMAL HIGH (ref 65–99)
Glucose-Capillary: 240 mg/dL — ABNORMAL HIGH (ref 65–99)
Glucose-Capillary: 157 mg/dL — ABNORMAL HIGH (ref 65–99)

## 2017-08-15 NOTE — Progress Notes (Signed)
RT  went to put on CPAP pt not ready until later RN to notify RT when pt ready

## 2017-08-15 NOTE — Progress Notes (Signed)
  Came to assess patient readiness for CPAP for the night. Pt was already on the machine tolerating it well.

## 2017-08-15 NOTE — Progress Notes (Signed)
Patient ID: Vincent Chandler, male   DOB: February 11, 1961, 57 y.o.   MRN: [id number]     Advanced Heart Failure Rounding Note  PCP-Cardiologist: Dr. [name]  Subjective:    PICC placed, co-ox 42%.  Started on milrinone 0.25 then increased to 0.375 mcg.   Yesterday milrinone cut back to 0.25 mcg. Todays CO-OX 75%. Also torsemide and losartan and added. Creatinine trending up 1.8>2.4.   Denies SOB/CP.  TEE-guided DCCV to NSR on 2/8, remains in NSR today.    Echo: EF 15%, mildly dilated LV, mild LVH, inferolateral akinesis, milr MR, mildly dilated RV with severely reduced systolic function.   TEE: Mildly dilated LV with EF 15%, diffuse hypokinesis, mildly dilated RV with severely decreased systolic function.     Objective:   Weight Range: 200 lb 14.4 oz (91.1 kg) Body mass index is 28.83 kg/m.   Vital Signs:   Temp:  [97.5 F (36.4 C)-98.1 F (36.7 C)] 97.5 F (36.4 C) (02/13 0424) Pulse Rate:  [66-83] 66 (02/13 0424) Resp:  [17-18] 18 (02/13 0424) BP: (91-150)/(50-80) 120/75 (02/13 0424) SpO2:  [95 %-100 %] 100 % (02/13 0424) Weight:  [200 lb 14.4 oz (91.1 kg)] 200 lb 14.4 oz (91.1 kg) (02/13 0424) Last BM Date: 08/14/17  Weight change: Filed Weights   08/13/17 0431 08/14/17 0624 08/15/17 0424  Weight: 202 lb 9.6 oz (91.9 kg) 200 lb 11.2 oz (91 kg) 200 lb 14.4 oz (91.1 kg)    Intake/Output:   Intake/Output Summary (Last 24 hours) at 08/15/2017 0812 Last data filed at 08/15/2017 0700 Gross per 24 hour  Intake 1063.52 ml  Output 950 ml  Net 113.52 ml      Physical Exam   CVP 6-7  General:  No resp difficulty HEENT: normal Neck: supple. no JVD. Carotids 2+ bilat; no bruits. No lymphadenopathy or thryomegaly appreciated. Cor: PMI nondisplaced. Regular rate & rhythm. No rubs, gallops or murmurs. Lungs: clear Abdomen: soft, nontender, nondistended. No hepatosplenomegaly. No bruits or masses. Good bowel sounds. Extremities: no cyanosis, clubbing, rash, edema. RUE PICC    Neuro: alert & orientedx3, cranial nerves grossly intact. moves all 4 extremities w/o difficulty. Affect pleasant   Telemetry   NSR 70s personally reviewed.   Labs    CBC Recent Labs    08/13/17 0452 08/15/17 0412  WBC 7.1 6.1  NEUTROABS 4.4 3.5  HGB 14.8 16.6  HCT 43.6 49.0  MCV 108.7* 109.4*  PLT 189 854   Basic Metabolic Panel Recent Labs    08/14/17 0515 08/15/17 0412  NA 135 132*  K 3.7 4.3  CL 97* 94*  CO2 23 24  GLUCOSE 162* 165*  BUN 55* 67*  CREATININE 1.82* 2.44*  CALCIUM 8.6* 9.1   Liver Function Tests No results for input(s): AST, ALT, ALKPHOS, BILITOT, PROT, ALBUMIN in the last 72 hours. No results for input(s): LIPASE, AMYLASE in the last 72 hours. Cardiac Enzymes No results for input(s): CKTOTAL, CKMB, CKMBINDEX, TROPONINI in the last 72 hours.  BNP: BNP (last 3 results) Recent Labs    08/07/17 1849  BNP 423.7*    ProBNP (last 3 results) No results for input(s): PROBNP in the last 8760 hours.   D-Dimer No results for input(s): DDIMER in the last 72 hours. Hemoglobin A1C No results for input(s): HGBA1C in the last 72 hours. Fasting Lipid Panel No results for input(s): CHOL, HDL, LDLCALC, TRIG, CHOLHDL, LDLDIRECT in the last 72 hours. Thyroid Function Tests No results for input(s): TSH, T4TOTAL, T3FREE, THYROIDAB in  the last 72 hours.  Invalid input(s): FREET3  Other results:   Imaging    No results found.   Medications:     Scheduled Medications: . amiodarone  400 mg Oral BID  . apixaban  5 mg Oral BID  . atorvastatin  40 mg Oral QHS  . digoxin  0.125 mg Oral Daily  . Influenza vac split quadrivalent PF  0.5 mL Intramuscular Tomorrow-1000  . insulin aspart  0-15 Units Subcutaneous TID WC  . insulin aspart  0-5 Units Subcutaneous QHS  . losartan  12.5 mg Oral Daily  . mometasone-formoterol  2 puff Inhalation BID  . pneumococcal 23 valent vaccine  0.5 mL Intramuscular Tomorrow-1000  . sodium chloride flush  10-40 mL  Intracatheter Q12H  . sodium chloride flush  3 mL Intravenous Q12H  . spironolactone  25 mg Oral Daily  . torsemide  40 mg Oral Daily    Infusions: . sodium chloride    . sodium chloride    . milrinone 0.25 mcg/kg/min (08/14/17 1154)    PRN Medications: sodium chloride, acetaminophen, guaiFENesin, guaiFENesin-dextromethorphan, nitroGLYCERIN, ondansetron (ZOFRAN) IV, sodium chloride, sodium chloride flush, sodium chloride flush    Patient Profile   Vincent Chandler is a 57 y.o. male with history of CAD s/p CABG, ischemic cardiomyopathy, prior VT, and medical noncompliance due to lack of insurance presents with acute on chronic systolic CHF in setting of atrial flutter with RVR.    Assessment/Plan   1. Atrial flutter: Prior history of atrial fibrillation post-op CABG.  I suspect CHF exacerbation has been triggered by the onset of atrial flutter.  Based on symptoms, he had probably been in flutter > 1 week.  DCCV to NSR on 2/8. Maintaining NSR - Off amiodarone drip and continue po amiodarone.  - Continue Eliquis 5 mg bid.   - Will ask EP if they would consider ablating his flutter.  2. Acute on chronic systolic CHF: Echo in 4/23 with EF 25-30%.  Ischemic cardiomyopathy. [place].  Echo this admission with EF 15%, mildly dilated RV with severely decreased systolic function.  Suspect exacerbation triggered by combination of atrial flutter and no meds x several months.   CVP 6-7. Creatinine up to 2.44. Hold torsemide.  CO-OX 75%. Cut milrinone down to 0;125 mcg.  - Continue digoxin 0.125 daily, level ok.   - Continue spironolactone 25 mg daily.    - Hold losartan with creatinine up to 2.44  - Low output is concerning.  LVAD would be a consideration but will need to review RV function and also compliance is an issue though this seems more due to lack of insurance pre-disability. Renal function will be a major issue as well. LVAD nurse appreciated.   3. CAD: s/p CABG.  1/16 cath  with PCI to mLCx/PLOM.  No chest pain, doubt ACS.  Mild troponin elevation with no trend is likely demand ischemia from volume overload/tachycardia.  - No s/s ischemia.  - Continue statin.  - Will be on Eliquis so will not restart ASA and Plavix (has not taken x at least 2 months).  4. AKI on CKD: Creatinine 1.4 => 1.64 => 1.5 => 1.79 => 2 => 2.26 => 1.8=>2.4.  Hold torsemide.   5. He will need help with his medications, involve HF navigator.   Amy Clegg NP-C  08/15/2017 8:12 AM  Patient seen with NP, agree with the above note.  Creatinine up to 2.4 today, got torsemide and losartan yesterday.  Co-ox 75% on milrinone  0.25.  Remains in NSR.    On exam, regular rhythm no S3, no JVD, clear lungs, no edema.   Stop torsemide and losartan today, repeat BMET in am.   Decrease milrinone to 0.125.  Continue other meds.    Long-term, I am concerned about Mr Poet.  I suspect that he is going to end up needing advanced therapies.  He hopes to get off milrinone this admission and work closely with [place] to see if he can avoid LVAD.  His barriers to LVAD are going to be renal function and RV function.   Loralie Champagne  08/15/2017 1:15 PM

## 2017-08-15 NOTE — Telephone Encounter (Signed)
Patient's sister Loletha Carrow called and wanted to discuss patient's "situation".  She asked specific questions related to the heart pump as well as his insurance.  I answered some questions and also referred to HF Program VAD Coordinators.  I have given her phone number and information to the VAD Coordinators-they will contact her to answer other specific questions related to the heart pump.  I encouraged her to continue to ask questions and I will speak with the patient directly tomorrow.

## 2017-08-15 NOTE — Progress Notes (Addendum)
CARDIAC REHAB PHASE I   PRE:  Rate/Rhythm: 82 SR    BP: sitting 105/67    SaO2: 90 RA  MODE:  Ambulation: 850 ft   POST:  Rate/Rhythm: 93 SR    BP: sitting 140/86     SaO2: 89 RA  Pt ambulated without c/o. No O2 needed, SaO2 89-90 RA. Encouraged deep breathing. Discussed ex gl, low sodium, and CRPII. Will send referral to [facility]. Encouraged pt to walk independently. Pt is determined to take better care of himself. [place], ACSM 08/15/2017 3:24 PM

## 2017-08-16 ENCOUNTER — Inpatient Hospital Stay (HOSPITAL_COMMUNITY): Payer: Medicare Other

## 2017-08-16 LAB — CBC WITH DIFFERENTIAL/PLATELET
BASOS PCT: 0 %
Basophils Absolute: 0 10*3/uL (ref 0.0–0.1)
EOS ABS: 0.2 10*3/uL (ref 0.0–0.7)
Eosinophils Relative: 4 %
HCT: 45.5 % (ref 39.0–52.0)
HEMOGLOBIN: 15.5 g/dL (ref 13.0–17.0)
Lymphocytes Relative: 30 %
Lymphs Abs: 1.8 10*3/uL (ref 0.7–4.0)
MCH: 37.3 pg — ABNORMAL HIGH (ref 26.0–34.0)
MCHC: 34.1 g/dL (ref 30.0–36.0)
MCV: 109.4 fL — ABNORMAL HIGH (ref 78.0–100.0)
Monocytes Absolute: 0.7 10*3/uL (ref 0.1–1.0)
Monocytes Relative: 11 %
NEUTROS PCT: 55 %
Neutro Abs: 3.3 10*3/uL (ref 1.7–7.7)
PLATELETS: 245 10*3/uL (ref 150–400)
RBC: 4.16 MIL/uL — AB (ref 4.22–5.81)
RDW: 14.1 % (ref 11.5–15.5)
WBC: 6 10*3/uL (ref 4.0–10.5)

## 2017-08-16 LAB — BASIC METABOLIC PANEL
Anion gap: 13 (ref 5–15)
BUN: 64 mg/dL — ABNORMAL HIGH (ref 6–20)
CALCIUM: 9 mg/dL (ref 8.9–10.3)
CHLORIDE: 98 mmol/L — AB (ref 101–111)
CO2: 24 mmol/L (ref 22–32)
CREATININE: 2.05 mg/dL — AB (ref 0.61–1.24)
GFR calc non Af Amer: 35 mL/min — ABNORMAL LOW (ref >60)
GFR, EST AFRICAN AMERICAN: 40 mL/min — AB (ref >60)
Glucose, Bld: 185 mg/dL — ABNORMAL HIGH (ref 65–99)
Potassium: 4.1 mmol/L (ref 3.5–5.1)
SODIUM: 135 mmol/L (ref 135–145)

## 2017-08-16 LAB — GLUCOSE, CAPILLARY
Glucose-Capillary: 187 mg/dL — ABNORMAL HIGH (ref 65–99)
Glucose-Capillary: 162 mg/dL — ABNORMAL HIGH (ref 65–99)
Glucose-Capillary: 209 mg/dL — ABNORMAL HIGH (ref 65–99)
Glucose-Capillary: 150 mg/dL — ABNORMAL HIGH (ref 65–99)
Glucose-Capillary: 165 mg/dL — ABNORMAL HIGH (ref 65–99)

## 2017-08-16 LAB — COOXEMETRY PANEL
CARBOXYHEMOGLOBIN: 1.9 % — AB (ref 0.5–1.5)
METHEMOGLOBIN: 0.8 % (ref 0.0–1.5)
O2 SAT: 70.5 %
Total hemoglobin: 15.9 g/dL (ref 12.0–16.0)

## 2017-08-16 LAB — MAGNESIUM: MAGNESIUM: 2.7 mg/dL — AB (ref 1.7–2.4)

## 2017-08-16 MED ORDER — SACUBITRIL-VALSARTAN 24-26 MG PO TABS
1.0000 | ORAL_TABLET | Freq: Two times a day (BID) | ORAL | Status: DC
  Administered 2017-08-16 – 2017-08-17 (×3): 1 via ORAL
  Filled 2017-08-16 (×3): qty 1

## 2017-08-16 NOTE — Progress Notes (Signed)
Nutrition Education Note  RD consulted for nutrition education regarding CHF.  Nutrition focused physical exam completed.  No muscle or subcutaneous fat depletion noticed. Appetite and intake have been good at home. Weight down since admission due to negative fluid balance. I/O -4.5 L since admission.  RD provided "Low Sodium Nutrition Therapy" handout from the Academy of [employer] Dietetics. Reviewed patient's dietary recall. Provided examples on ways to decrease sodium intake in diet. Discouraged intake of processed foods and use of salt shaker. Encouraged fresh fruits and vegetables as well as whole grain sources of carbohydrates to maximize fiber intake.   RD discussed why it is important for patient to adhere to diet recommendations, and emphasized the role of fluids, foods to avoid, and importance of weighing self daily. Teach back method used.  Expect good compliance.  Body mass index is 28.8 kg/m. Pt meets criteria for overweight based on current BMI.  Current diet order is heart healthy CHO modified, patient is consuming approximately 100% of meals at this time. Labs and medications reviewed. No further nutrition interventions warranted at this time. RD contact information provided. If additional nutrition issues arise, please re-consult RD.   Molli Barrows, RD, LDN, [place] Pager [phone] After Hours Pager [phone]

## 2017-08-16 NOTE — Progress Notes (Addendum)
Patient ID: Vincent Chandler, male   DOB: Aug 21, 1960, 57 y.o.   MRN: [id number]     Advanced Heart Failure Rounding Note  PCP-Cardiologist: Dr. [name]  Subjective:    PICC placed, co-ox 42%.  Started on milrinone 0.25 then increased to 0.375 mcg.   Yesterday milrinone cut back to 0.125 mcg. CO-OX stable. Also held torsemide and losartan with elevated creatinine. Creatinine 2.44>2.0.   Denies SOB. Denies CP. Want to go home.   TEE-guided DCCV to NSR on 2/8, remains in NSR today.    Echo: EF 15%, mildly dilated LV, mild LVH, inferolateral akinesis, milr MR, mildly dilated RV with severely reduced systolic function.   TEE: Mildly dilated LV with EF 15%, diffuse hypokinesis, mildly dilated RV with severely decreased systolic function.     Objective:   Weight Range: 200 lb 11.2 oz (91 kg) Body mass index is 28.8 kg/m.   Vital Signs:   Temp:  [97.4 F (36.3 C)-98.1 F (36.7 C)] 97.6 F (36.4 C) (02/14 0445) Pulse Rate:  [66-82] 73 (02/14 0815) Resp:  [16-17] 17 (02/14 0445) BP: (105-138)/(67-92) 116/71 (02/14 0445) SpO2:  [94 %-98 %] 98 % (02/14 0445) Weight:  [200 lb 11.2 oz (91 kg)] 200 lb 11.2 oz (91 kg) (02/14 0445) Last BM Date: 08/14/17  Weight change: Filed Weights   08/14/17 0624 08/15/17 0424 08/16/17 0445  Weight: 200 lb 11.2 oz (91 kg) 200 lb 14.4 oz (91.1 kg) 200 lb 11.2 oz (91 kg)    Intake/Output:   Intake/Output Summary (Last 24 hours) at 08/16/2017 0827 Last data filed at 08/16/2017 0448 Gross per 24 hour  Intake 487.15 ml  Output 1100 ml  Net -612.85 ml      Physical Exam  CVP 6-7  General:  No resp difficulty HEENT: normal Neck: supple. JVP 6-7. Carotids 2+ bilat; no bruits. No lymphadenopathy or thryomegaly appreciated. Cor: PMI nondisplaced. Regular rate & rhythm. No rubs, gallops or murmurs. Lungs: clear Abdomen: soft, nontender, nondistended. No hepatosplenomegaly. No bruits or masses. Good bowel sounds. Extremities: no cyanosis, clubbing,  rash, edema. RUE PICC  Neuro: alert & orientedx3, cranial nerves grossly intact. moves all 4 extremities w/o difficulty. Affect pleasant   Telemetry   NSR 60-70s   Labs    CBC Recent Labs    08/15/17 0412 08/16/17 0556  WBC 6.1 6.0  NEUTROABS 3.5 3.3  HGB 16.6 15.5  HCT 49.0 45.5  MCV 109.4* 109.4*  PLT 196 349   Basic Metabolic Panel Recent Labs    08/15/17 0412 08/16/17 0556  NA 132* 135  K 4.3 4.1  CL 94* 98*  CO2 24 24  GLUCOSE 165* 185*  BUN 67* 64*  CREATININE 2.44* 2.05*  CALCIUM 9.1 9.0   Liver Function Tests No results for input(s): AST, ALT, ALKPHOS, BILITOT, PROT, ALBUMIN in the last 72 hours. No results for input(s): LIPASE, AMYLASE in the last 72 hours. Cardiac Enzymes No results for input(s): CKTOTAL, CKMB, CKMBINDEX, TROPONINI in the last 72 hours.  BNP: BNP (last 3 results) Recent Labs    08/07/17 1849  BNP 423.7*    ProBNP (last 3 results) No results for input(s): PROBNP in the last 8760 hours.   D-Dimer No results for input(s): DDIMER in the last 72 hours. Hemoglobin A1C No results for input(s): HGBA1C in the last 72 hours. Fasting Lipid Panel No results for input(s): CHOL, HDL, LDLCALC, TRIG, CHOLHDL, LDLDIRECT in the last 72 hours. Thyroid Function Tests No results for input(s): TSH, T4TOTAL, T3FREE,  THYROIDAB in the last 72 hours.  Invalid input(s): FREET3  Other results:   Imaging    No results found.   Medications:     Scheduled Medications: . amiodarone  400 mg Oral BID  . apixaban  5 mg Oral BID  . atorvastatin  40 mg Oral QHS  . digoxin  0.125 mg Oral Daily  . Influenza vac split quadrivalent PF  0.5 mL Intramuscular Tomorrow-1000  . insulin aspart  0-15 Units Subcutaneous TID WC  . insulin aspart  0-5 Units Subcutaneous QHS  . mometasone-formoterol  2 puff Inhalation BID  . pneumococcal 23 valent vaccine  0.5 mL Intramuscular Tomorrow-1000  . sodium chloride flush  10-40 mL Intracatheter Q12H  . sodium  chloride flush  3 mL Intravenous Q12H  . spironolactone  25 mg Oral Daily    Infusions: . sodium chloride    . sodium chloride    . milrinone 0.125 mcg/kg/min (08/15/17 2225)    PRN Medications: sodium chloride, acetaminophen, guaiFENesin, guaiFENesin-dextromethorphan, nitroGLYCERIN, ondansetron (ZOFRAN) IV, sodium chloride, sodium chloride flush, sodium chloride flush    Patient Profile   Vincent Chandler is a 57 y.o. male with history of CAD s/p CABG, ischemic cardiomyopathy, prior VT, and medical noncompliance due to lack of insurance presents with acute on chronic systolic CHF in setting of atrial flutter with RVR.    Assessment/Plan   1. Atrial flutter: Prior history of atrial fibrillation post-op CABG.  I suspect CHF exacerbation has been triggered by the onset of atrial flutter.  Based on symptoms, he had probably been in flutter > 1 week.  DCCV to NSR on 2/8. Maintaining NSR - Off amiodarone drip and continue po amiodarone.  - Continue Eliquis 5 mg bid.   - Will ask EP if they would consider ablating his flutter.  2. Acute on chronic systolic CHF: Echo in 6/27 with EF 25-30%.  Ischemic cardiomyopathy. [place].  Echo this admission with EF 15%, mildly dilated RV with severely decreased systolic function.  Suspect exacerbation triggered by combination of atrial flutter and no meds x several months.   Creatinine trending down off torsemide and losartan.  CO-OX 71%. Stop milrinone.   - Volume status stable off torsemide.  - Continue digoxin 0.125 daily, level ok.   - Continue spironolactone 25 mg daily.   -  Add entresto 24-26 mg twice a day. No other diuretics.  - Low output is concerning.  LVAD would be a consideration but will need to review RV function and also compliance is an issue though this seems more due to lack of insurance pre-disability. Renal function will be a major issue as well. LVAD nurse appreciated.   3. CAD: s/p CABG.  1/16 cath with PCI to  mLCx/PLOM.  No chest pain, doubt ACS.  Mild troponin elevation with no trend is likely demand ischemia from volume overload/tachycardia.  - No s/s ischemia.  - Continue statin.  - Will be on Eliquis so will not restart ASA and Plavix (has not taken x at least 2 months).  4. AKI on CKD: Creatinine 1.4 => 1.64 => 1.5 => 1.79 => 2 => 2.26 => 1.8=>2.4=> 2.05  Improved off torsemide BMET in am.    5. He will need help with his medications, involve HF navigator.    Home likely Saturday.   Amy Clegg NP-C  08/16/2017 8:27 AM   Patient seen with NP, agree with the above note.  Mr Renstrom looks good today.  Co-ox 71%, CVP remains  low at 6-7.  Weight is down about 15 lbs total.  He remains on milrinone 0.125, no diuretics yesterday.  Creatinine down to 2.05.   On exam, he is not volume overloaded with JVP flat and no edema.  Clear lungs.  He remains in NSR.   Today, we will stop milrinone, repeat co-ox in morning.  I am not going to restart a diuretic but will start him on Entresto 24/26 bid.  Continue current digoxin and spironolactone.    He remains in NSR on po amiodarone after DCCV.   Given this admission with low output failure, I am concerned that he may need advanced therapies down the road.  Renal function and RV function remain a concern for LVAD.  His baseline creatinine is about 1.4.  Will try to get him home tomorrow and will then follow him closely.  Will need CPX.  Will start LVAD workup and will need transplant consideration based on age.  If he does not do well as an outpatient, may need to proceed with LVAD.   Loralie Champagne 08/16/2017 9:34 AM

## 2017-08-16 NOTE — Progress Notes (Signed)
Met with patient and his cousin, sherry, today to further discuss LVAD evaluation. Discussed that at this point, he would be considered for destination therapy should he be deemed an acceptable VAD candidate. Provided brief equipment overview of the HeartMate III pump and discussed placement, surgical procedure, peripheral equipment, life-long coumadin therapy, importance of medication adherence and clinic follow up for as long as patient is living on support, life-style modifications, as well as need for caregiver to be successful with this therapy.   Patient and his cousin asked appropriate questions regarding diet changes and medication changes that he could make now to improve his A1C and prevent symptoms from his heart failure. Encouraged patient to make necessary changes to improve his blood sugars. Patient is requesting to be enrolled in cardiac rehab as an outpatient and would like to have nutritional counseling.  Patient signed LVAD evaluation consent. Orders placed per Dr. [name]. Will follow up in AHF clinic alongside team.  Balinda Quails RN, VAD Coordinator 24/7 pager [phone]

## 2017-08-16 NOTE — Progress Notes (Signed)
Heart Failure Navigator Consult Note  Presentation: Per Dr. [name]: Vincent Chandler is a 57 yo with history of CAD s/p CABG, ischemic cardiomyopathy, prior VT, and medical noncompliance due to lack of insurance presents with acute on chronic systolic CHF in setting of atrial flutter with RVR.   Patient has not been seen by cardiology for about a year.  He has had spotty compliance with meds and office visits due to lack of job/insurance.  He has not taken any meds for at least a couple of months.  Last echo in 9/16 showed Ef 25-30%.  Last cath in 1/16 resulted in PCI to mLCx/PLOM.  He had post-op atrial fibrillation after CABG, it appears, but no known atrial arrhythmia has been seen since then until today.   He has felt "weak" for months with some dyspnea, but over the last 1.5 weeks, he has been very short of breath.  He has noted dyspnea walking up even a short flight of stairs.  He has been markedly orthopneic.  No chest pain.  No lightheadedness or syncope.  He does not feel palpitations.  He just got disability and is hoping that it will be easier for him to get his meds.  In the ER, he was noted to be in atrial flutter with RVR and was given IV Lasix and started on heparin gtt.    Past Medical History:  Diagnosis Date  . Anginal pain ([name])   . Anxiety   . Asthma   . Atrial fibrillation-postoperative 11/28/2012  . Cardiomyopathy    alcohol use related  . CHF (congestive heart failure) ([place])   . Chronic back pain   . Coronary artery disease    drug eluting stent RCA 2005-EF 30%- s/p CABG x 4; 2/4 patent grafts with SVG-PLOM and SVG-RCA system totally occluded. There are collaterals from the LAD system to the RCA and the RCA is totally occluded proximally. There are no collaterals to the LCx territory. He then underwent successful PCI of the mid left circumflex artery with overlapping Synergy drug-eluting stents  . Depression   . Diabetes mellitus type II dx'd in the 1990's  . GERD  (gastroesophageal reflux disease)   . H/O hiatal hernia   . History of hypogonadism   . Hyperlipidemia   . Hypertension   . Kidney stones   . Migraines    "used to"  . OSA on CPAP    "mask is broken; working on getting a new one" (01/15/2015)  . Pneumonia 3-4 times  . Urinary incontinence     Social History   Socioeconomic History  . Marital status: Divorced    Spouse name: n/a  . Number of children: 0  . Years of education: 12th grade  . Highest education level: None  Social Needs  . Financial resource strain: None  . Food insecurity - worry: None  . Food insecurity - inability: None  . Transportation needs - medical: None  . Transportation needs - non-medical: None  Occupational History  . Occupation: Data processing manager ---self employed    Employer: [employer]  Tobacco Use  . Smoking status: Never Smoker  . Smokeless tobacco: Current User    Types: Chew  Substance and Sexual Activity  . Alcohol use: Yes    Alcohol/week: 3.6 oz    Types: 6 Shots of liquor per week  . Drug use: No  . Sexual activity: Yes    Partners: Female  Other Topics Concern  . None  Social History  Narrative   Exercise-- walking    Lives alone.   Brother lives in [place], [place]    ECHO:Study Conclusions-08/09/16  - Left ventricle: The cavity size was mildly dilated. Wall   thickness was increased in a pattern of mild LVH. Systolic   function was severely reduced. The estimated ejection fraction   was 15%. Diffuse hypokinesis. There is akinesis of the   inferolateral myocardium. The study is not technically sufficient   to allow evaluation of LV diastolic function. - Aortic valve: There was trivial regurgitation. - Mitral valve: There was mild regurgitation. - Right ventricle: The cavity size was mildly dilated. Systolic   function was severely reduced.  Impressions:  - Definity used; global hypokinesis with akinesis of the   inferolateral wall; overall  severely reduced LV function; mild   LVE; mild LVH; trace AI; mild MR; mild RVE with severely reduced   RV fuction.   BNP    Component Value Date/Time   BNP 423.7 (H) 08/07/2017 1849   BNP 78.8 07/14/2015 1547    ProBNP    Component Value Date/Time   PROBNP 731 (H) 07/12/2016 1603   PROBNP 165.0 (H) 06/16/2013 1111     Education Assessment and Provision:  Detailed education and instructions provided on heart failure disease management including the following:  Signs and symptoms of Heart Failure When to call the physician Importance of daily weights Low sodium diet Fluid restriction Medication management Anticipated future follow-up appointments  Patient education given on each of the above topics.  Patient acknowledges understanding and acceptance of all instructions.  I spent time today discussing with Vincent Chandler regarding his HF diagnosis and current hospitalization.  He admits that he was told before that he had HF and unfortunately "lost insurance" and therefore was not taking any medications nor seeing doctors as needed.  I reviewed the importance of daily weights and when to contact the physician.  He does say that he has a scale at home.  I reviewed a low sodium diet as well as high sodium foods to avoid.  He admits that he had issues with medications (due to no part D coverage).  We will plan to send all HF medications to the [facility] to be filled through the [facility] at the time of discharge.  He tells me that he can get to the [facility] without any issue.  I have referred him to our HF Clinic LCSW for ongoing financial assistance.  He will follow in the AHF Clinic.  Education Materials:  "Living Better With Heart Failure" Booklet, Daily Weight Tracker Tool    High Risk Criteria for Readmission and/or Poor Patient Outcomes:  (Recommend Follow-up with Advanced Heart Failure Clinic)-yes   EF <30%- 15%  2 or more admissions in 6 months-  No  Difficult social situation- denies  Demonstrates medication noncompliance- yes no Part D (prescription coverage)  Barriers of Care:  Knowledge, compliance and financial  Discharge Planning:  Plans to return to his home alone to [place].  He will be referred to the [facility] for ongoing HF education, symptom recognition and compliance reinforcement.

## 2017-08-17 ENCOUNTER — Telehealth (HOSPITAL_COMMUNITY): Payer: Self-pay | Admitting: *Deleted

## 2017-08-17 LAB — TSH: TSH: 5.619 u[IU]/mL — AB (ref 0.350–4.500)

## 2017-08-17 LAB — CBC WITH DIFFERENTIAL/PLATELET
Basophils Absolute: 0.1 10*3/uL (ref 0.0–0.1)
Basophils Relative: 1 %
EOS ABS: 0.3 10*3/uL (ref 0.0–0.7)
EOS PCT: 4 %
HCT: 47.6 % (ref 39.0–52.0)
HEMOGLOBIN: 15.9 g/dL (ref 13.0–17.0)
LYMPHS PCT: 27 %
Lymphs Abs: 1.7 10*3/uL (ref 0.7–4.0)
MCH: 36.9 pg — ABNORMAL HIGH (ref 26.0–34.0)
MCHC: 33.4 g/dL (ref 30.0–36.0)
MCV: 110.4 fL — ABNORMAL HIGH (ref 78.0–100.0)
MONO ABS: 0.8 10*3/uL (ref 0.1–1.0)
Monocytes Relative: 13 %
NEUTROS PCT: 55 %
Neutro Abs: 3.5 10*3/uL (ref 1.7–7.7)
PLATELETS: 218 10*3/uL (ref 150–400)
RBC: 4.31 MIL/uL (ref 4.22–5.81)
RDW: 13.9 % (ref 11.5–15.5)
WBC: 6.4 10*3/uL (ref 4.0–10.5)

## 2017-08-17 LAB — T4, FREE
FREE T4: 1.08 ng/dL (ref 0.61–1.12)
FREE T4: 1.31 ng/dL — AB (ref 0.61–1.12)

## 2017-08-17 LAB — COMPREHENSIVE METABOLIC PANEL
ALT: 53 U/L (ref 17–63)
ANION GAP: 11 (ref 5–15)
AST: 50 U/L — ABNORMAL HIGH (ref 15–41)
Albumin: 3.4 g/dL — ABNORMAL LOW (ref 3.5–5.0)
Alkaline Phosphatase: 95 U/L (ref 38–126)
BUN: 47 mg/dL — ABNORMAL HIGH (ref 6–20)
CO2: 25 mmol/L (ref 22–32)
CREATININE: 1.62 mg/dL — AB (ref 0.61–1.24)
Calcium: 9 mg/dL (ref 8.9–10.3)
Chloride: 98 mmol/L — ABNORMAL LOW (ref 101–111)
GFR, EST AFRICAN AMERICAN: 53 mL/min — AB (ref >60)
GFR, EST NON AFRICAN AMERICAN: 46 mL/min — AB (ref >60)
Glucose, Bld: 140 mg/dL — ABNORMAL HIGH (ref 65–99)
POTASSIUM: 4.4 mmol/L (ref 3.5–5.1)
SODIUM: 134 mmol/L — AB (ref 135–145)
Total Bilirubin: 1.4 mg/dL — ABNORMAL HIGH (ref 0.3–1.2)
Total Protein: 8 g/dL (ref 6.5–8.1)

## 2017-08-17 LAB — PREALBUMIN: Prealbumin: 24.2 mg/dL (ref 18–38)

## 2017-08-17 LAB — COOXEMETRY PANEL
Carboxyhemoglobin: 1.3 % (ref 0.5–1.5)
Methemoglobin: 1.2 % (ref 0.0–1.5)
O2 SAT: 60.7 %
Total hemoglobin: 16 g/dL (ref 12.0–16.0)

## 2017-08-17 LAB — LIPID PANEL
CHOLESTEROL: 134 mg/dL (ref 0–200)
HDL: 30 mg/dL — ABNORMAL LOW (ref >40)
LDL Cholesterol: 86 mg/dL (ref 0–99)
TRIGLYCERIDES: 92 mg/dL (ref <150)
Total CHOL/HDL Ratio: 4.5 RATIO
VLDL: 18 mg/dL (ref 0–40)

## 2017-08-17 LAB — URIC ACID: URIC ACID, SERUM: 12 mg/dL — AB (ref 4.4–7.6)

## 2017-08-17 LAB — PROTIME-INR
INR: 1.32
PROTHROMBIN TIME: 16.3 s — AB (ref 11.4–15.2)

## 2017-08-17 LAB — APTT: APTT: 34 s (ref 24–36)

## 2017-08-17 LAB — ANTITHROMBIN III: AntiThromb III Func: 99 % (ref 75–120)

## 2017-08-17 LAB — MAGNESIUM: MAGNESIUM: 2.7 mg/dL — AB (ref 1.7–2.4)

## 2017-08-17 LAB — LACTATE DEHYDROGENASE: LDH: 224 U/L — AB (ref 98–192)

## 2017-08-17 LAB — HIV ANTIBODY (ROUTINE TESTING W REFLEX): HIV SCREEN 4TH GENERATION: NONREACTIVE

## 2017-08-17 LAB — GLUCOSE, CAPILLARY
Glucose-Capillary: 144 mg/dL — ABNORMAL HIGH (ref 65–99)
Glucose-Capillary: 220 mg/dL — ABNORMAL HIGH (ref 65–99)

## 2017-08-17 LAB — HEMOGLOBIN A1C
Hgb A1c MFr Bld: 8.7 % — ABNORMAL HIGH (ref 4.8–5.6)
Mean Plasma Glucose: 202.99 mg/dL

## 2017-08-17 LAB — PSA: PROSTATIC SPECIFIC ANTIGEN: 0.26 ng/mL (ref 0.00–4.00)

## 2017-08-17 MED ORDER — MOMETASONE FURO-FORMOTEROL FUM 200-5 MCG/ACT IN AERO: 2.0000 | INHALATION_SPRAY | Freq: Two times a day (BID) | RESPIRATORY_TRACT | 0 refills | Status: DC

## 2017-08-17 MED ORDER — ATORVASTATIN CALCIUM 40 MG PO TABS: 40.0000 mg | ORAL_TABLET | Freq: Every day | ORAL | Status: DC

## 2017-08-17 MED ORDER — METFORMIN HCL 500 MG PO TABS
500.0000 mg | ORAL_TABLET | Freq: Two times a day (BID) | ORAL | Status: DC
  Administered 2017-08-17: 500 mg via ORAL
  Filled 2017-08-17: qty 1

## 2017-08-17 MED ORDER — CARVEDILOL 3.125 MG PO TABS: 3.1250 mg | ORAL_TABLET | Freq: Two times a day (BID) | ORAL | Status: DC

## 2017-08-17 MED ORDER — SPIRONOLACTONE 25 MG PO TABS: 25.0000 mg | ORAL_TABLET | Freq: Every day | ORAL | Status: DC

## 2017-08-17 MED ORDER — GUAIFENESIN ER 600 MG PO TB12: 600.0000 mg | ORAL_TABLET | Freq: Two times a day (BID) | ORAL | Status: AC | PRN

## 2017-08-17 MED ORDER — APIXABAN 5 MG PO TABS: 5.0000 mg | ORAL_TABLET | Freq: Two times a day (BID) | ORAL | 3 refills | Status: DC

## 2017-08-17 MED ORDER — CARVEDILOL 3.125 MG PO TABS
3.1250 mg | ORAL_TABLET | Freq: Two times a day (BID) | ORAL | Status: DC
  Administered 2017-08-17: 3.125 mg via ORAL
  Filled 2017-08-17: qty 1

## 2017-08-17 MED ORDER — AMIODARONE HCL 200 MG PO TABS: ORAL_TABLET | ORAL | Status: DC

## 2017-08-17 MED ORDER — DIGOXIN 125 MCG PO TABS: 0.1250 mg | ORAL_TABLET | Freq: Every day | ORAL | 3 refills | Status: DC

## 2017-08-17 MED ORDER — METFORMIN HCL 500 MG PO TABS: 500.0000 mg | ORAL_TABLET | Freq: Two times a day (BID) | ORAL | 3 refills | Status: DC

## 2017-08-17 MED ORDER — AMIODARONE HCL 200 MG PO TABS
200.0000 mg | ORAL_TABLET | Freq: Two times a day (BID) | ORAL | Status: DC
  Administered 2017-08-17: 200 mg via ORAL
  Filled 2017-08-17: qty 1

## 2017-08-17 MED ORDER — SACUBITRIL-VALSARTAN 24-26 MG PO TABS: 1.0000 | ORAL_TABLET | Freq: Two times a day (BID) | ORAL | 3 refills | Status: DC

## 2017-08-17 MED FILL — ATORVASTATIN 40 MG TABLET: 40 | 34 days supply | Qty: 34 | Fill #0

## 2017-08-17 MED FILL — AMIODARONE HCL 200 MG TAB: 200 | 34 days supply | Qty: 41 | Fill #0

## 2017-08-17 MED FILL — SPIRONOLACTONE 25 MG TABLET: 25 | 34 days supply | Qty: 34 | Fill #0

## 2017-08-17 MED FILL — ELIQUIS 5 MG TABLET: 5 | 30 days supply | Qty: 60 | Fill #0

## 2017-08-17 MED FILL — DIGOXIN 0.125 MG TABLET: 125 | 30 days supply | Qty: 30 | Fill #0

## 2017-08-17 MED FILL — CARVEDILOL 3.125 MG TABLET: 3.125 | 34 days supply | Qty: 68 | Fill #0

## 2017-08-17 MED FILL — DULERA 200 MCG/5 MCG INH: 200-5 | 30 days supply | Qty: 13 | Fill #0

## 2017-08-17 MED FILL — metFORMIN HCL 500 MG TABS: 500 | 30 days supply | Qty: 60 | Fill #0

## 2017-08-17 NOTE — Care Management Important Message (Signed)
Important Message  Patient Details  Name: Vrishank Moster MRN: [id number] Date of Birth: [birth year]   Medicare Important Message Given:  Yes    Davan Nawabi 08/17/2017, 12:10 PM

## 2017-08-17 NOTE — Discharge Summary (Signed)
Advanced Heart Failure Discharge Note  Discharge Summary   Patient ID: Vincent Chandler MRN: [id number], DOB/AGE: 56-Jul-1962 56 y.o. Admit date: 08/07/2017 D/C date:     08/17/2017   Primary Discharge Diagnoses:   1. Atrial flutter: Prior history of atrial fibrillation post-op CABG. s/pDCCV to NSR on 2/8.  2. Acute on chronic systolic CHF: Echo in 6/07 with EF 25-30%. Ischemic cardiomyopathy. [place]. Echo this admission with EF 15%, mildly dilated RV with severely decreased systolic function.   3. CAD: s/p CABG. 1/16 cath with PCI to mLCx/PLOM. 4. AKI on CKD III 5. DM2 6. OSA  Hospital Course:   Vincent Chandler is a 57 y.o. male  with history of CAD s/p CABG, ischemic cardiomyopathy, prior VT, and medical noncompliance due to lack of insurance presents with acute on chronic systolic CHF in setting of atrial flutter with RVR.   1. Atrial flutter: Prior history of atrial fibrillation post-op CABG.  - Suspect CHF exacerbation has been triggered by the onset of atrial flutter. Based on symptoms, he had probably likely been in flutter >1 week. Loaded on IV amiodaroneDCCV to NSR on 2/8. Transitinoned to po amiodarone, and maintained NSR for the rest of the admission.  - Will plan EP follow up for ablation consideration as outpatient. Pt on Eliquis.  2. Acute on chronic systolic CHF: Echo in 3/71 with EF 25-30%. Ischemic cardiomyopathy. [place]. Echo this admission with EF 15%, mildly dilated RV with severely decreased systolic function.  Suspect exacerbation triggered by combination of atrial flutter and no meds x several months.  - o-ox 61% off milrinone, CVP 6 on day of discharge - Meds adjusted as tolerated, including switch to [employer]. Pt has no Medicare-D/Med coverage, so meds will be provided through HF fund for time being.  - Low output this admission is concerning. Will start LVAD workup and will need transplant consideration based on age.  If he does  not do well as an outpatient, may need to proceed with LVAD. Renal function and RV function may be a limitation for LVAD.  3. CAD: s/p CABG. 1/16 cath with PCI to mLCx/PLOM.  - No s/s of ischemia this admission. Mild troponin elevation with no trend is likely demand ischemia from volume overload/tachycardia.  - Statin continued. No ASA or plavix on Eliquis.  4. AKI on CKD III:  - Creatinine up to 2.2 this am, but back to baseline at 1.6 on day of discharge.  5. Diabetes:  - Pt had been off metformin and was on Janumet apparently at home. Will stop Janumet with CHF, and resume his metformin.  - Planned for empagliflozin, but patient assistance is not available to Medicare/Non-insured patients, so unable to give to patient.  6. OSA: - Has been off CPAP at home. Will need to see Dr Radford Pax as outpatient. May end up needing repeat sleep study.   Pt examined am of 08/17/17 and determined to be stable for home with close follow up as below.. Creatinine had improved, Coox remained stable, and remained in NSR on po amiodarone. HF medications provided through HF funds.   Discharge Weight Range: 201 lbs Discharge Vitals: Blood pressure 121/72, pulse 66, temperature 97.8 F (36.6 C), temperature source Oral, resp. rate 19, height _0  (1.778 m), weight 201 lb 6.4 oz (91.4 kg), SpO2 96 %.  Labs: Lab Results  Component Value Date   WBC 6.4 08/17/2017   HGB 15.9 08/17/2017   HCT 47.6 08/17/2017   MCV 110.4 (H)  08/17/2017   PLT 218 08/17/2017    Recent Labs  Lab 08/17/17 0422  NA 134*  K 4.4  CL 98*  CO2 25  BUN 47*  CREATININE 1.62*  CALCIUM 9.0  PROT 8.0  BILITOT 1.4*  ALKPHOS 95  ALT 53  AST 50*  GLUCOSE 140*   Lab Results  Component Value Date   CHOL 134 08/17/2017   HDL 30 (L) 08/17/2017   LDLCALC 86 08/17/2017   TRIG 92 08/17/2017   BNP (last 3 results) Recent Labs    08/07/17 1849  BNP 423.7*    ProBNP (last 3 results) No results for input(s): PROBNP in the last  8760 hours.   Diagnostic Studies/Procedures   Ct Abdomen Pelvis Wo Contrast  Result Date: 08/16/2017 CLINICAL DATA:  Chest and abdominal pain. EXAM: CT CHEST, ABDOMEN AND PELVIS WITHOUT CONTRAST TECHNIQUE: Multidetector CT imaging of the chest, abdomen and pelvis was performed following the standard protocol without IV contrast. COMPARISON:  Chest CT 07/18/2014 FINDINGS: CT CHEST FINDINGS Cardiovascular: The heart is enlarged but appears stable. Pacer wires are noted. There is tortuosity and calcification of the thoracic aorta. Coronary artery calcifications are noted. Surgical changes from bypass surgery. Again demonstrated is an aberrant right subclavian artery. Mediastinum/Nodes: Numerous small scattered mediastinal and hilar lymph nodes appearing stable when compared to prior study from 2016. No mass. The esophagus is grossly normal. Lungs/Pleura: Chronic peripheral interstitial lung disease with interstitial thickening, nodularity and architectural distortion. There is a cephalocaudal gradient suggesting usual interstitial pneumonitis. Bilateral lower lobe peribronchial thickening, hazy ground-glass opacity and marked interstitial thickening could reflect superimposed pulmonary edema or worsening pneumonitis with acute alveolitis. No focal airspace consolidation. No definite pleural effusions. Musculoskeletal: No significant bony findings. Surgical changes are noted from bypass surgery. Permanent left-sided pacemaker noted. A right-sided central venous catheter is noted. The tip is in the right atrium. CT ABDOMEN PELVIS FINDINGS Hepatobiliary: No focal hepatic lesions or intrahepatic biliary dilatation. The gallbladder is normal. No common bile duct dilatation. Pancreas: No mass, inflammation or ductal dilatation. Spleen: Normal size.  No focal lesions. Adrenals/Urinary Tract: The adrenal glands and kidneys are unremarkable except for bilateral lower pole renal calculi. The largest calculus is on the  left side and has the appearance of a staghorn calculus measuring a maximum of 15 mm. No obstructing ureteral calculi or bladder calculi. No renal or bladder mass. Stomach/Bowel: The stomach, duodenum, small bowel and colon are unremarkable. No acute inflammatory changes, mass lesions or obstructive findings. The terminal ileum is normal. The appendix is normal. Vascular/Lymphatic: Scattered aortic calcifications. No aneurysm. Small scattered mesenteric and retroperitoneal lymph nodes but no mass or overt adenopathy. Reproductive: The prostate gland and seminal vesicles appear normal. Other: No pelvic mass or adenopathy. No free pelvic fluid collections. No inguinal mass or adenopathy. No abdominal wall hernia or subcutaneous lesions. Musculoskeletal: No significant bony findings. IMPRESSION: 1. Bilateral lower pole renal calculi but no obstructing ureteral calculi or bladder calculi. 2. No acute abdominal/pelvic findings, mass lesions or lymphadenopathy. 3. Stable cardiac enlargement and coronary artery calcifications. 4. Stable borderline enlarged mediastinal and hilar lymph nodes since 2016. This is likely related to the patient's interstitial lung disease. 5. Interstitial lung disease, likely usual interstitial pneumonitis. Superimposed bilateral lower lobe edema or acute pneumonitis/alveolitis. Electronically Signed   By: Marijo Sanes M.D.   On: 08/16/2017 17:44   Dg Orthopantogram  Result Date: 08/16/2017 CLINICAL DATA:  Pre LVAD EXAM: ORTHOPANTOGRAM/PANORAMIC COMPARISON:  None. FINDINGS: Caries left upper second molar.  No other caries or periapical abscess. No acute bony abnormality IMPRESSION: Caries left upper second molar Electronically Signed   By: Franchot Gallo M.D.   On: 08/16/2017 15:34   Ct Chest Wo Contrast  Result Date: 08/16/2017 CLINICAL DATA:  Chest and abdominal pain. EXAM: CT CHEST, ABDOMEN AND PELVIS WITHOUT CONTRAST TECHNIQUE: Multidetector CT imaging of the chest, abdomen and  pelvis was performed following the standard protocol without IV contrast. COMPARISON:  Chest CT 07/18/2014 FINDINGS: CT CHEST FINDINGS Cardiovascular: The heart is enlarged but appears stable. Pacer wires are noted. There is tortuosity and calcification of the thoracic aorta. Coronary artery calcifications are noted. Surgical changes from bypass surgery. Again demonstrated is an aberrant right subclavian artery. Mediastinum/Nodes: Numerous small scattered mediastinal and hilar lymph nodes appearing stable when compared to prior study from 2016. No mass. The esophagus is grossly normal. Lungs/Pleura: Chronic peripheral interstitial lung disease with interstitial thickening, nodularity and architectural distortion. There is a cephalocaudal gradient suggesting usual interstitial pneumonitis. Bilateral lower lobe peribronchial thickening, hazy ground-glass opacity and marked interstitial thickening could reflect superimposed pulmonary edema or worsening pneumonitis with acute alveolitis. No focal airspace consolidation. No definite pleural effusions. Musculoskeletal: No significant bony findings. Surgical changes are noted from bypass surgery. Permanent left-sided pacemaker noted. A right-sided central venous catheter is noted. The tip is in the right atrium. CT ABDOMEN PELVIS FINDINGS Hepatobiliary: No focal hepatic lesions or intrahepatic biliary dilatation. The gallbladder is normal. No common bile duct dilatation. Pancreas: No mass, inflammation or ductal dilatation. Spleen: Normal size.  No focal lesions. Adrenals/Urinary Tract: The adrenal glands and kidneys are unremarkable except for bilateral lower pole renal calculi. The largest calculus is on the left side and has the appearance of a staghorn calculus measuring a maximum of 15 mm. No obstructing ureteral calculi or bladder calculi. No renal or bladder mass. Stomach/Bowel: The stomach, duodenum, small bowel and colon are unremarkable. No acute inflammatory  changes, mass lesions or obstructive findings. The terminal ileum is normal. The appendix is normal. Vascular/Lymphatic: Scattered aortic calcifications. No aneurysm. Small scattered mesenteric and retroperitoneal lymph nodes but no mass or overt adenopathy. Reproductive: The prostate gland and seminal vesicles appear normal. Other: No pelvic mass or adenopathy. No free pelvic fluid collections. No inguinal mass or adenopathy. No abdominal wall hernia or subcutaneous lesions. Musculoskeletal: No significant bony findings. IMPRESSION: 1. Bilateral lower pole renal calculi but no obstructing ureteral calculi or bladder calculi. 2. No acute abdominal/pelvic findings, mass lesions or lymphadenopathy. 3. Stable cardiac enlargement and coronary artery calcifications. 4. Stable borderline enlarged mediastinal and hilar lymph nodes since 2016. This is likely related to the patient's interstitial lung disease. 5. Interstitial lung disease, likely usual interstitial pneumonitis. Superimposed bilateral lower lobe edema or acute pneumonitis/alveolitis. Electronically Signed   By: Marijo Sanes M.D.   On: 08/16/2017 17:44    Discharge Medications   Allergies as of 08/17/2017   No Known Allergies     Medication List    STOP taking these medications   budesonide-formoterol 160-4.5 MCG/ACT inhaler Commonly known as:  SYMBICORT Replaced by:  mometasone-formoterol 200-5 MCG/ACT Aero     TAKE these medications   amiodarone 200 MG tablet Commonly known as:  PACERONE Take 1 tablet (200 mg total) twice daily x 1 week, then decrease to 1 tablet daily.   apixaban 5 MG Tabs tablet Commonly known as:  ELIQUIS Take 1 tablet (5 mg total) by mouth 2 (two) times daily.   atorvastatin 40 MG tablet Commonly known as:  LIPITOR Take 1 tablet (40 mg total) by mouth at bedtime.   carvedilol 3.125 MG tablet Commonly known as:  COREG Take 1 tablet (3.125 mg total) by mouth 2 (two) times daily with a meal. What changed:      medication strength  how much to take  when to take this   digoxin 0.125 MG tablet Commonly known as:  LANOXIN Take 1 tablet (0.125 mg total) by mouth daily. Start taking on:  08/18/2017   guaiFENesin 600 MG 12 hr tablet Commonly known as:  MUCINEX Take 1 tablet (600 mg total) by mouth 2 (two) times daily as needed for to loosen phlegm.   metFORMIN 500 MG tablet Commonly known as:  GLUCOPHAGE Take 1 tablet (500 mg total) by mouth 2 (two) times daily with a meal.   mometasone-formoterol 200-5 MCG/ACT Aero Commonly known as:  DULERA Inhale 2 puffs into the lungs 2 (two) times daily. Further refills per PCP Replaces:  budesonide-formoterol 160-4.5 MCG/ACT inhaler   nitroGLYCERIN 0.4 MG SL tablet Commonly known as:  NITROSTAT Place 1 tablet (0.4 mg total) under the tongue every 5 (five) minutes as needed for chest pain.   sacubitril-valsartan 24-26 MG Commonly known as:  ENTRESTO Take 1 tablet by mouth 2 (two) times daily.   spironolactone 25 MG tablet Commonly known as:  ALDACTONE Take 1 tablet (25 mg total) by mouth daily. Start taking on:  08/18/2017            Durable Medical Equipment  (From admission, onward)        Start     Ordered   08/14/17 0946  Heart failure home health orders  (Heart failure home health orders / Face to face)  Once    Comments:  Heart Failure Follow-up Care:  Verify follow-up appointments per Patient Discharge Instructions. Confirm transportation arranged. Reconcile home medications with discharge medication list. Remove discontinued medications from use. Assist patient/caregiver to manage medications using pill box. Reinforce low sodium food selection Assessments: Vital signs and oxygen saturation at each visit. Assess home environment for safety concerns, caregiver support and availability of low-sodium foods. Consult Education officer, museum, PT/OT, Dietitian, and CNA based on assessments. Perform comprehensive cardiopulmonary assessment.  Notify MD for any change in condition or weight gain of 3 pounds in one day or 5 pounds in one week with symptoms. Daily Weights and Symptom Monitoring: Ensure patient has access to scales. Teach patient/caregiver to weigh daily before breakfast and after voiding using same scale and record.    Teach patient/caregiver to track weight and symptoms and when to notify Provider. Activity: Develop individualized activity plan with patient/caregiver.  Home Paraenteral Inotropic Therapy : Data Collection Form  Patients name: Vincent Chandler   Date: 08/10/17  Information below may not be completed by the supplier nor anyone in a Financial relationship with the supplier.  1. Results of invasive hemodynamic monitoring  Cardiac Index Before Inotrope infusion:            1.29               On Inotrope infusion:            1.4               Drug and dose:   Milrinone 0.25 mcg/kg/min  2. Cardiac medications immediately prior to inotrope infusion (List name, dose, and frequency)  Carvedilol 3.125 mg BID Entresto 24/26 mg BID Lasix 40 mg IV BID  3. Dose this represent maximum tolerated doses of  these medications? Yes.   4. Breathing status Prior to inotrope infusion: Dyspnea at rest  At time of discharge: Dyspnea on moderate exertion.   5. Initial home prescription Drug and Dose:   Milrinone 0.25 mcg/kg/min for continuous infusion 24/hr day and 7 days/week  6. If continuous infusion is prescribed, have attempts to discontinue inotrope infusion in the hospital failed?   Yes.   7. If intermittent infusion is prescribed, have there been repeated hospitalizations for heart failure which Parenteral inotrope were required? Not applicable.   8. Is patient capable of going to the physician for outpatient evaluation? Yes.   9. Is routine electrocardiographic monitoring required in the Home?  No.   The above statements and any additional explanations included separately are true and accurate and there  is documentation present in the patients medical record to support these statements.   Completed by Georgiana Shore, NP   In instances where this form was completed by an Advanced Practice Provider, please see EMR for physician Co-Signature.  [facility] to provide  Labs every other week to include BMET, Mg, and CBC with Diff. Additional as needed.   Q2229 Milrinone 0.25 mcg/kg/min X 52 weeks A4221 Supplies for maintenance of drug infusion catheter A4222 Supplies for the external drug infusion per cassette or bag E0781 Ambulatory Infusion pump  Question Answer Comment  Heart Failure Follow-up Care [facility] ([facility]) [facility] at [phone]   Obtain the following labs Basic Metabolic Panel   Lab frequency Weekly   Fax lab results to AHF Clinic at [phone]   Diet Low Sodium Heart Healthy   Fluid restrictions: 2000 mL Fluid   Initiate Heart Failure Clinic Diuretic Protocol to be used by [place] only ( to be ordered by Heart Failure Team Providers Only) Yes      08/14/17 0946      Disposition   The patient will be discharged in stable condition to home. Discharge Instructions    (HEART FAILURE PATIENTS) Call MD:  Anytime you have any of the following symptoms: 1) 3 pound weight gain in 24 hours or 5 pounds in 1 week 2) shortness of breath, with or without a dry hacking cough 3) swelling in the hands, feet or stomach 4) if you have to sleep on extra pillows at night in order to breathe.   Complete by:  As directed    Diet - low sodium heart healthy   Complete by:  As directed    Increase activity slowly   Complete by:  As directed    STOP any activity that causes chest pain, shortness of breath, dizziness, sweating, or exessive weakness   Complete by:  As directed      Follow-up Information    [facility]. Go on 08/27/2017.   Specialty:  Cardiology Why:  12:00 PM, Advanced [facility] Failure Clinic, parking code  [facility] information: [address] [id number] [place] [phone]            Duration of Discharge Encounter: Greater than 35 minutes   Signed, Annamaria Helling 08/17/2017, 11:45 AM

## 2017-08-17 NOTE — Progress Notes (Addendum)
Case Management consult for Jardiance.  Coupon not applicable with state or federally [employer]. Patient not eligable for coupon.  Coupon can assist with [employer] only, of which patient does not have.  Spoke w Dr Chalmers Cater, patient will not DC w Jardiance. He will DC w Eliquis and Entresto. He was provided with 30 day coupons. Meds called in by HF Clinic are ready at [place], [place] spoke w pharmacy and verified.  They close at [facility] today, patient will need DC'd in time to get meds.

## 2017-08-17 NOTE — Telephone Encounter (Signed)
Pt being d/c from hospital today, following meds were faxed to [facility] for HF FUND:  Spironolactone 25 mg daily Carvedilol 3.125 mg Twice daily  Atorvastatin 40 mg daily Amiodarone 200 mg take Twice daily for 1 week then daily  Pt was given free cards for Entresto, Eliquis and Jardiance.  Pt is sch for f/u appt 08/27/17 at 12 pm.  Will notify Franchot Heidelberg and Kennyth Lose, CSW to f/u with pt at that time for further asst.

## 2017-08-17 NOTE — Progress Notes (Addendum)
Patient ID: Vincent Chandler, male   DOB: 1961-05-27, 57 y.o.   MRN: [id number]     Advanced Heart Failure Rounding Note  PCP-Cardiologist: Dr. [name]  Subjective:    PICC placed, co-ox 42%.  Started on milrinone 0.25 then increased to 0.375 mcg.   Yesterday milrinone stopped. CO-OX 61%.  Creatinine 2.44>2.0>1.62.   Denies SOB. Denies CP. Feels good and ready to go home.   TEE-guided DCCV to NSR on 2/8, remains in NSR today.    Echo: EF 15%, mildly dilated LV, mild LVH, inferolateral akinesis, milr MR, mildly dilated RV with severely reduced systolic function.   TEE: Mildly dilated LV with EF 15%, diffuse hypokinesis, mildly dilated RV with severely decreased systolic function.     Objective:   Weight Range: 201 lb 6.4 oz (91.4 kg) Body mass index is 28.9 kg/m.   Vital Signs:   Temp:  [97.6 F (36.4 C)-98.1 F (36.7 C)] 97.8 F (36.6 C) (02/15 0440) Pulse Rate:  [61-73] 61 (02/15 0440) Resp:  [18-20] 19 (02/15 0440) BP: (118-135)/(73-86) 118/78 (02/15 0440) SpO2:  [95 %-99 %] 96 % (02/15 0440) Weight:  [201 lb 6.4 oz (91.4 kg)] 201 lb 6.4 oz (91.4 kg) (02/15 0440) Last BM Date: 08/16/17(per pt)  Weight change: Filed Weights   08/15/17 0424 08/16/17 0445 08/17/17 0440  Weight: 200 lb 14.4 oz (91.1 kg) 200 lb 11.2 oz (91 kg) 201 lb 6.4 oz (91.4 kg)    Intake/Output:   Intake/Output Summary (Last 24 hours) at 08/17/2017 0737 Last data filed at 08/17/2017 0458 Gross per 24 hour  Intake 1984.82 ml  Output 925 ml  Net 1059.82 ml      Physical Exam  CVP 6 General: NAD Neck: No JVD, no thyromegaly or thyroid nodule.  Lungs: Clear to auscultation bilaterally with normal respiratory effort. CV: Nondisplaced PMI.  Heart regular S1/S2, no S3/S4, no murmur.  No peripheral edema.   Abdomen: Soft, nontender, no hepatosplenomegaly, no distention.  Skin: Intact without lesions or rashes.  Neurologic: Alert and oriented x 3.  Psych: Normal affect. Extremities: No clubbing or  cyanosis.  HEENT: Normal.   Telemetry   NSR 70s (personally reviewed).    Labs    CBC Recent Labs    08/16/17 0556 08/17/17 0422  WBC 6.0 6.4  NEUTROABS 3.3 PENDING  HGB 15.5 15.9  HCT 45.5 47.6  MCV 109.4* 110.4*  PLT 245 563   Basic Metabolic Panel Recent Labs    08/16/17 0556 08/17/17 0422  NA 135 134*  K 4.1 4.4  CL 98* 98*  CO2 24 25  GLUCOSE 185* 140*  BUN 64* 47*  CREATININE 2.05* 1.62*  CALCIUM 9.0 9.0  MG 2.7* 2.7*   Liver Function Tests Recent Labs    08/17/17 0422  AST 50*  ALT 53  ALKPHOS 95  BILITOT 1.4*  PROT 8.0  ALBUMIN 3.4*   No results for input(s): LIPASE, AMYLASE in the last 72 hours. Cardiac Enzymes No results for input(s): CKTOTAL, CKMB, CKMBINDEX, TROPONINI in the last 72 hours.  BNP: BNP (last 3 results) Recent Labs    08/07/17 1849  BNP 423.7*    ProBNP (last 3 results) No results for input(s): PROBNP in the last 8760 hours.   D-Dimer No results for input(s): DDIMER in the last 72 hours. Hemoglobin A1C Recent Labs    08/17/17 0424  HGBA1C 8.7*   Fasting Lipid Panel Recent Labs    08/17/17 0424  CHOL 134  HDL 30*  LDLCALC  86  TRIG 92  CHOLHDL 4.5   Thyroid Function Tests Recent Labs    08/17/17 0424  TSH 5.619*    Other results:   Imaging    Ct Abdomen Pelvis Wo Contrast  Result Date: 08/16/2017 CLINICAL DATA:  Chest and abdominal pain. EXAM: CT CHEST, ABDOMEN AND PELVIS WITHOUT CONTRAST TECHNIQUE: Multidetector CT imaging of the chest, abdomen and pelvis was performed following the standard protocol without IV contrast. COMPARISON:  Chest CT 07/18/2014 FINDINGS: CT CHEST FINDINGS Cardiovascular: The heart is enlarged but appears stable. Pacer wires are noted. There is tortuosity and calcification of the thoracic aorta. Coronary artery calcifications are noted. Surgical changes from bypass surgery. Again demonstrated is an aberrant right subclavian artery. Mediastinum/Nodes: Numerous small  scattered mediastinal and hilar lymph nodes appearing stable when compared to prior study from 2016. No mass. The esophagus is grossly normal. Lungs/Pleura: Chronic peripheral interstitial lung disease with interstitial thickening, nodularity and architectural distortion. There is a cephalocaudal gradient suggesting usual interstitial pneumonitis. Bilateral lower lobe peribronchial thickening, hazy ground-glass opacity and marked interstitial thickening could reflect superimposed pulmonary edema or worsening pneumonitis with acute alveolitis. No focal airspace consolidation. No definite pleural effusions. Musculoskeletal: No significant bony findings. Surgical changes are noted from bypass surgery. Permanent left-sided pacemaker noted. A right-sided central venous catheter is noted. The tip is in the right atrium. CT ABDOMEN PELVIS FINDINGS Hepatobiliary: No focal hepatic lesions or intrahepatic biliary dilatation. The gallbladder is normal. No common bile duct dilatation. Pancreas: No mass, inflammation or ductal dilatation. Spleen: Normal size.  No focal lesions. Adrenals/Urinary Tract: The adrenal glands and kidneys are unremarkable except for bilateral lower pole renal calculi. The largest calculus is on the left side and has the appearance of a staghorn calculus measuring a maximum of 15 mm. No obstructing ureteral calculi or bladder calculi. No renal or bladder mass. Stomach/Bowel: The stomach, duodenum, small bowel and colon are unremarkable. No acute inflammatory changes, mass lesions or obstructive findings. The terminal ileum is normal. The appendix is normal. Vascular/Lymphatic: Scattered aortic calcifications. No aneurysm. Small scattered mesenteric and retroperitoneal lymph nodes but no mass or overt adenopathy. Reproductive: The prostate gland and seminal vesicles appear normal. Other: No pelvic mass or adenopathy. No free pelvic fluid collections. No inguinal mass or adenopathy. No abdominal wall  hernia or subcutaneous lesions. Musculoskeletal: No significant bony findings. IMPRESSION: 1. Bilateral lower pole renal calculi but no obstructing ureteral calculi or bladder calculi. 2. No acute abdominal/pelvic findings, mass lesions or lymphadenopathy. 3. Stable cardiac enlargement and coronary artery calcifications. 4. Stable borderline enlarged mediastinal and hilar lymph nodes since 2016. This is likely related to the patient's interstitial lung disease. 5. Interstitial lung disease, likely usual interstitial pneumonitis. Superimposed bilateral lower lobe edema or acute pneumonitis/alveolitis. Electronically Signed   By: Marijo Sanes M.D.   On: 08/16/2017 17:44   Dg Orthopantogram  Result Date: 08/16/2017 CLINICAL DATA:  Pre LVAD EXAM: ORTHOPANTOGRAM/PANORAMIC COMPARISON:  None. FINDINGS: Caries left upper second molar. No other caries or periapical abscess. No acute bony abnormality IMPRESSION: Caries left upper second molar Electronically Signed   By: Franchot Gallo M.D.   On: 08/16/2017 15:34   Ct Chest Wo Contrast  Result Date: 08/16/2017 CLINICAL DATA:  Chest and abdominal pain. EXAM: CT CHEST, ABDOMEN AND PELVIS WITHOUT CONTRAST TECHNIQUE: Multidetector CT imaging of the chest, abdomen and pelvis was performed following the standard protocol without IV contrast. COMPARISON:  Chest CT 07/18/2014 FINDINGS: CT CHEST FINDINGS Cardiovascular: The heart is enlarged but  appears stable. Pacer wires are noted. There is tortuosity and calcification of the thoracic aorta. Coronary artery calcifications are noted. Surgical changes from bypass surgery. Again demonstrated is an aberrant right subclavian artery. Mediastinum/Nodes: Numerous small scattered mediastinal and hilar lymph nodes appearing stable when compared to prior study from 2016. No mass. The esophagus is grossly normal. Lungs/Pleura: Chronic peripheral interstitial lung disease with interstitial thickening, nodularity and architectural  distortion. There is a cephalocaudal gradient suggesting usual interstitial pneumonitis. Bilateral lower lobe peribronchial thickening, hazy ground-glass opacity and marked interstitial thickening could reflect superimposed pulmonary edema or worsening pneumonitis with acute alveolitis. No focal airspace consolidation. No definite pleural effusions. Musculoskeletal: No significant bony findings. Surgical changes are noted from bypass surgery. Permanent left-sided pacemaker noted. A right-sided central venous catheter is noted. The tip is in the right atrium. CT ABDOMEN PELVIS FINDINGS Hepatobiliary: No focal hepatic lesions or intrahepatic biliary dilatation. The gallbladder is normal. No common bile duct dilatation. Pancreas: No mass, inflammation or ductal dilatation. Spleen: Normal size.  No focal lesions. Adrenals/Urinary Tract: The adrenal glands and kidneys are unremarkable except for bilateral lower pole renal calculi. The largest calculus is on the left side and has the appearance of a staghorn calculus measuring a maximum of 15 mm. No obstructing ureteral calculi or bladder calculi. No renal or bladder mass. Stomach/Bowel: The stomach, duodenum, small bowel and colon are unremarkable. No acute inflammatory changes, mass lesions or obstructive findings. The terminal ileum is normal. The appendix is normal. Vascular/Lymphatic: Scattered aortic calcifications. No aneurysm. Small scattered mesenteric and retroperitoneal lymph nodes but no mass or overt adenopathy. Reproductive: The prostate gland and seminal vesicles appear normal. Other: No pelvic mass or adenopathy. No free pelvic fluid collections. No inguinal mass or adenopathy. No abdominal wall hernia or subcutaneous lesions. Musculoskeletal: No significant bony findings. IMPRESSION: 1. Bilateral lower pole renal calculi but no obstructing ureteral calculi or bladder calculi. 2. No acute abdominal/pelvic findings, mass lesions or lymphadenopathy. 3.  Stable cardiac enlargement and coronary artery calcifications. 4. Stable borderline enlarged mediastinal and hilar lymph nodes since 2016. This is likely related to the patient's interstitial lung disease. 5. Interstitial lung disease, likely usual interstitial pneumonitis. Superimposed bilateral lower lobe edema or acute pneumonitis/alveolitis. Electronically Signed   By: Marijo Sanes M.D.   On: 08/16/2017 17:44     Medications:     Scheduled Medications: . amiodarone  200 mg Oral BID  . apixaban  5 mg Oral BID  . atorvastatin  40 mg Oral QHS  . carvedilol  3.125 mg Oral BID WC  . digoxin  0.125 mg Oral Daily  . Influenza vac split quadrivalent PF  0.5 mL Intramuscular Tomorrow-1000  . insulin aspart  0-15 Units Subcutaneous TID WC  . insulin aspart  0-5 Units Subcutaneous QHS  . mometasone-formoterol  2 puff Inhalation BID  . pneumococcal 23 valent vaccine  0.5 mL Intramuscular Tomorrow-1000  . sacubitril-valsartan  1 tablet Oral BID  . sodium chloride flush  10-40 mL Intracatheter Q12H  . sodium chloride flush  3 mL Intravenous Q12H  . spironolactone  25 mg Oral Daily    Infusions: . sodium chloride    . sodium chloride      PRN Medications: sodium chloride, acetaminophen, guaiFENesin, guaiFENesin-dextromethorphan, nitroGLYCERIN, ondansetron (ZOFRAN) IV, sodium chloride, sodium chloride flush, sodium chloride flush    Patient Profile   Makael Stein is a 57 y.o. male with history of CAD s/p CABG, ischemic cardiomyopathy, prior VT, and medical noncompliance due to lack  of insurance presents with acute on chronic systolic CHF in setting of atrial flutter with RVR.    Assessment/Plan   1. Atrial flutter: Prior history of atrial fibrillation post-op CABG.  I suspect CHF exacerbation has been triggered by the onset of atrial flutter.  Based on symptoms, he had probably been in flutter > 1 week.  DCCV to NSR on 2/8. Maintaining NSR - Off amiodarone drip and continue po  amiodarone => can decrease to 200 mg bid today.  - Continue Eliquis 5 mg bid.   - Will ask EP if they would consider ablating his flutter => will need EP followup.  2. Acute on chronic systolic CHF: Echo in 8/25 with EF 25-30%.  Ischemic cardiomyopathy. [place].  Echo this admission with EF 15%, mildly dilated RV with severely decreased systolic function.  Suspect exacerbation triggered by combination of atrial flutter and no meds x several months.  Co-ox 61% off milrinone, CVP 6.  - Continue Entresto 24/26 bid.  - Add low dose Coreg 3.125 mg bid since we are cutting back on amiodarone.   - Continue digoxin 0.125 daily, level ok.   - Continue spironolactone 25 mg daily.   - Will stay off diuretic for now with CVP 6.  He is on Entresto and would like for him to get on empagliflozin going home.  - Low output this admission is concerning. Will start LVAD workup and will need transplant consideration based on age.  If he does not do well as an outpatient, may need to proceed with LVAD. Renal function and RV function may be a limitation for LVAD.    3. CAD: s/p CABG.  1/16 cath with PCI to mLCx/PLOM.  No chest pain, doubt ACS.  Mild troponin elevation with no trend is likely demand ischemia from volume overload/tachycardia. No chest pain.  - Continue statin (restarted this admission).   - Will be on Eliquis so will not restart ASA and Plavix (has not taken x at least 2 months).  4. AKI on CKD: Creatinine 1.4 => 1.64 => 1.5 => 1.79 => 2 => 2.26 => 1.8=>2.4=> 2.05 => 1.62.    5. Diabetes: He was on Janumet apparently at home.  I would like him to start on empagliflozin at home 10 mg daily (GFR 46) and on his prior home dose of metformin.  6. OSA: Needs to get back on CPAP at home. Will refer to Dr Radford Pax as outpatient.  May end up needing repeat sleep study.  7. Disposition: I think he can go home today.  He will need close followup in CHF clinic in 7-10 days and will need to meet at that time  with LVAD nurse. He needs appointment with EP for evaluation for aflutter ablation.  Needs referral to Dr. Radford Pax to restart CPAP.  He needs assistance for his meds.  I would like him to start empagliflozin 10 mg daily going home (GFR 46).  Meds for home: Metformin at prior home dose, empagliflozin 10 mg daily, apixaban 5 mg bid, Entresto 24/26 bid, spironolactone 25 daily, Coreg 3.125 mg bid, amiodarone 200 mg bid x 1 week then 200 mg daily, atorvastatin 40 mg daily. Needs CPX set up as outpatient.   Loralie Champagne 08/17/2017 7:37 AM

## 2017-08-18 LAB — HEPATITIS C ANTIBODY: HCV AB: 0.2 {s_co_ratio} (ref 0.0–0.9)

## 2017-08-18 LAB — HEPATITIS B SURFACE ANTIBODY,QUALITATIVE: HEP B S AB: NONREACTIVE

## 2017-08-18 LAB — HEPATITIS B SURFACE ANTIGEN: HEP B S AG: NEGATIVE

## 2017-08-18 LAB — T3, FREE: T3, Free: 2.4 pg/mL (ref 2.0–4.4)

## 2017-08-18 LAB — HEPATITIS B CORE ANTIBODY, TOTAL: Hep B Core Total Ab: NEGATIVE

## 2017-08-19 LAB — LUPUS ANTICOAGULANT PANEL
DRVVT: 117 s — ABNORMAL HIGH (ref 0.0–47.0)
PTT Lupus Anticoagulant: 40.2 s (ref 0.0–51.9)

## 2017-08-19 LAB — DRVVT CONFIRM: DRVVT CONFIRM: 1.5 ratio — AB (ref 0.8–1.2)

## 2017-08-19 LAB — DRVVT MIX: DRVVT MIX: 63.2 s — AB (ref 0.0–47.0)

## 2017-08-20 ENCOUNTER — Encounter: Payer: Self-pay | Admitting: Gastroenterology

## 2017-08-20 ENCOUNTER — Telehealth (HOSPITAL_COMMUNITY): Payer: Self-pay

## 2017-08-20 ENCOUNTER — Telehealth: Payer: Self-pay | Admitting: Internal Medicine

## 2017-08-20 NOTE — Telephone Encounter (Signed)
Spoke with patient in regards to his "Flu" shot and possible symptomatic reactions to it.  I first advised him to f/u with PCP, but he has an appt with Dr. Caryl Comes on Wednesday and we'll go from there

## 2017-08-20 NOTE — Telephone Encounter (Signed)
Patient calling,   States that the day patient was discharged he was feeling "Fluish" and that he believes it was due to the flu shot. Patient need advice on medication he could take.

## 2017-08-21 ENCOUNTER — Telehealth (HOSPITAL_COMMUNITY): Payer: Self-pay | Admitting: *Deleted

## 2017-08-21 ENCOUNTER — Telehealth (HOSPITAL_COMMUNITY): Payer: Self-pay

## 2017-08-21 ENCOUNTER — Telehealth: Payer: Self-pay | Admitting: Licensed Clinical Social Worker

## 2017-08-21 ENCOUNTER — Telehealth (HOSPITAL_COMMUNITY): Payer: Self-pay | Admitting: Vascular Surgery

## 2017-08-21 ENCOUNTER — Encounter (HOSPITAL_COMMUNITY): Payer: Self-pay | Admitting: *Deleted

## 2017-08-21 NOTE — Telephone Encounter (Signed)
Attempted another call to sch home visit-no answer again and unable to leave VM.   Marylouise Stacks, EMT-Paramedic 08/21/17

## 2017-08-21 NOTE — Progress Notes (Signed)
Called patient regarding appointments made for VAD evaluation. Reviewed schedule with patient and all questioned answered. Will follow.  Balinda Quails RN, VAD Coordinator 24/7 pager [phone]

## 2017-08-21 NOTE — Telephone Encounter (Signed)
Call pt ot schedule CPX, pt does not have VM set up will try pt back later

## 2017-08-21 NOTE — Telephone Encounter (Signed)
CSW referred to complete LVAD psychosocial assessment. CSW contacted patient and arranged to meet after APP visit on Monday August 27, 2017. Raquel Sarna, [place], [place]

## 2017-08-21 NOTE — Telephone Encounter (Signed)
Attempted to call pt for appointment, I spoke with him last week while he was in hosp and he advised I could call once he is home. I called. No answer. Unable to leave VM.   Marylouise Stacks, EMT-Paramedic  08/21/17

## 2017-08-21 NOTE — Telephone Encounter (Signed)
Contacted patient to give CPX instructions and to answer any questions prior to appointment Thursday 2/21 at 1400. All patient's questions were answered and patient agreeable with CPX plan.     Landis Martins, MS, ACSM-RCEP 08/21/2017 4:56 PM

## 2017-08-22 ENCOUNTER — Other Ambulatory Visit (HOSPITAL_COMMUNITY): Payer: Self-pay | Admitting: *Deleted

## 2017-08-22 ENCOUNTER — Ambulatory Visit (INDEPENDENT_AMBULATORY_CARE_PROVIDER_SITE_OTHER): Payer: Medicare Other | Admitting: Internal Medicine

## 2017-08-22 VITALS — BP 116/66 | HR 62 | Ht 70.0 in | Wt 200.0 lb

## 2017-08-22 DIAGNOSIS — I255 Ischemic cardiomyopathy: Secondary | ICD-10-CM | POA: Diagnosis not present

## 2017-08-22 DIAGNOSIS — Z95810 Presence of automatic (implantable) cardiac defibrillator: Secondary | ICD-10-CM

## 2017-08-22 DIAGNOSIS — Z01812 Encounter for preprocedural laboratory examination: Secondary | ICD-10-CM

## 2017-08-22 DIAGNOSIS — I4892 Unspecified atrial flutter: Secondary | ICD-10-CM

## 2017-08-22 DIAGNOSIS — I509 Heart failure, unspecified: Secondary | ICD-10-CM

## 2017-08-22 DIAGNOSIS — I5022 Chronic systolic (congestive) heart failure: Secondary | ICD-10-CM | POA: Diagnosis not present

## 2017-08-22 LAB — CUP PACEART INCLINIC DEVICE CHECK
Battery Remaining Longevity: 122 mo
Brady Statistic RV Percent Paced: 0.01 %
HighPow Impedance: 87 Ohm
Implantable Lead Location: 753862
Implantable Lead Model: 293
Lead Channel Impedance Value: 532 Ohm
Lead Channel Impedance Value: 532 Ohm
Lead Channel Setting Pacing Amplitude: 2.5 V
Lead Channel Setting Pacing Pulse Width: 0.4 ms
Lead Channel Setting Sensing Sensitivity: 0.3 mV
MDC IDC LEAD IMPLANT DT: 20160715
MDC IDC LEAD SERIAL: 382379
MDC IDC MSMT BATTERY VOLTAGE: 3.01 V
MDC IDC MSMT LEADCHNL RV PACING THRESHOLD AMPLITUDE: 0.75 V
MDC IDC MSMT LEADCHNL RV PACING THRESHOLD PULSEWIDTH: 0.4 ms
MDC IDC MSMT LEADCHNL RV SENSING INTR AMPL: 11.375 mV
MDC IDC PG IMPLANT DT: 20160715
MDC IDC SESS DTM: 20190220174023

## 2017-08-22 LAB — FACTOR 5 LEIDEN

## 2017-08-22 NOTE — Patient Instructions (Addendum)
Medication Instructions:  Your physician recommends that you continue on your current medications as directed. Please refer to the Current Medication list given to you today.  Labwork: You will need labs drawn on March 8 -- CBC and BMP  Testing/Procedures:  Your physician has recommended that you have an ablation. Catheter ablation is a medical procedure used to treat some cardiac arrhythmias (irregular heartbeats). During catheter ablation, a long, thin, flexible tube is put into a blood vessel in your groin (upper thigh), or neck. This tube is called an ablation catheter. It is then guided to your heart through the blood vessel. Radio frequency waves destroy small areas of heart tissue where abnormal heartbeats may cause an arrhythmia to start. Please see the instruction sheet given to you today.  Please arrive at the [facility] main entrance of [place] hospital at:  September 12, 2017 at 6:30am for an 8:30 procedure time Do not eat or drink after midnight prior to procedure Do not take any medications the morning of the procedure except Eliquis You will need someone to drive you home at discharge  Follow-Up: Your physician recommends that you schedule a follow-up appointment on:   October 25, 2017 with Dr Caryl Comes   Any Other Special Instructions Will Be Listed Below (If Applicable).     If you need a refill on your cardiac medications before your next appointment, please call your pharmacy.

## 2017-08-22 NOTE — H&P (View-Only) (Signed)
Patient Care Team: [name], Alferd Apa, DO as PCP - General (Family Medicine) Burtis Junes, NP as Nurse Practitioner (Nurse Practitioner) Deboraha Sprang, MD as Consulting Physician (Cardiology) Allyn Kenner, MD (Dermatology)   HPI  Vincent Chandler is a 57 y.o. male Seen in followup for ischemic heart disease and sinus tachycardia. When he was seen last new Q waves were identified. There is a concern about an intercurrent myocardial infarction        Echo 2012 The estimated ejection fraction was in the range of 25% to 30%. Inferior posterior akinesis- Left atrium: The atrium was mildly dilated.   he underwent catheterization 2/14 .demonstrating severe 3 vessel disease with occlusion of the RCA stent. He underwent bypass surgeryx4--complicated by atrial fibrillation with a rapid ventricular response for which he is treated with amiodarone.  No interval Afib    2/16 >>Echo revealed that his EF had dropped further to 20-25%. G3DD. Severe diffuse hypokinesis. He underwent left heart cath revealing 2/4 patent grafts with SVG-PLOM and SVG-RCA system totally occluded. There are collaterals from the LAD system to the RCA and the RCA is totally occluded proximally. There are no collaterals to the LCx territory. He then underwent successful PCI of the mid left circumflex artery with overlapping Synergy drug-eluting stents ;  He is s/p ICD 7/16.   He was admitted 2/19 with congestive heart failure.  Echocardiogram EF 15% he was found to be in atrial flutter-typical.  He was in low output shock and required milrinone for support   He was discharged on amiodarone and Ativan  Given left ventricular low output, evaluation was initiated for LVAD  Feels better today and is now "swearing he will take his meds"  Does not want to have to go to LVAD  No edema, breathing at baseline.  No chest pain   Does not recall palps  No bleeding   No cough or nausea on amiod  Past Medical History:   Diagnosis Date  . Anginal pain ([place])   . Anxiety   . Asthma   . Atrial fibrillation-postoperative 11/28/2012  . Cardiomyopathy    alcohol use related  . CHF (congestive heart failure) ([place])   . Chronic back pain   . Coronary artery disease    drug eluting stent RCA 2005-EF 30%- s/p CABG x 4; 2/4 patent grafts with SVG-PLOM and SVG-RCA system totally occluded. There are collaterals from the LAD system to the RCA and the RCA is totally occluded proximally. There are no collaterals to the LCx territory. He then underwent successful PCI of the mid left circumflex artery with overlapping Synergy drug-eluting stents  . Depression   . Diabetes mellitus type II dx'd in the 1990's  . GERD (gastroesophageal reflux disease)   . H/O hiatal hernia   . History of hypogonadism   . Hyperlipidemia   . Hypertension   . Kidney stones   . Migraines    "used to"  . OSA on CPAP    "mask is broken; working on getting a new one" (01/15/2015)  . Pneumonia 3-4 times  . Urinary incontinence     Past Surgical History:  Procedure Laterality Date  . CARDIAC DEFIBRILLATOR PLACEMENT  01/15/2015  . CARDIOVERSION N/A 08/10/2017   Procedure: CARDIOVERSION;  Surgeon: Larey Dresser, MD;  Location: [facility] ENDOSCOPY;  Service: Cardiovascular;  Laterality: N/A;  . CORONARY ANGIOPLASTY WITH STENT PLACEMENT  ~ 2003  . CORONARY ARTERY BYPASS GRAFT N/A 08/15/2012   Procedure: CORONARY ARTERY BYPASS GRAFTING (  CABG);  Surgeon: Ivin Poot, MD;  Location: [place];  Service: Open Heart Surgery;  Laterality: N/A;  Coronary Artery Bypass Grafting Times Four Using Left Internal Mammary Artery and Right Saphenous Leg Vein Harvested Endoscopically  . EP IMPLANTABLE DEVICE N/A 01/15/2015   Procedure: ICD Implant;  Surgeon: Deboraha Sprang, MD;  Location: [place] CV LAB;  Service: Cardiovascular;  Laterality: N/A;  . LEFT HEART CATHETERIZATION WITH CORONARY ANGIOGRAM N/A 08/08/2012   Procedure: LEFT HEART CATHETERIZATION WITH  CORONARY ANGIOGRAM;  Surgeon: Peter M Martinique, MD;  Location: [facility] CATH LAB;  Service: Cardiovascular;  Laterality: N/A;  . LEFT HEART CATHETERIZATION WITH CORONARY/GRAFT ANGIOGRAM N/A 07/21/2014   Procedure: LEFT HEART CATHETERIZATION WITH Beatrix Fetters;  Surgeon: Larey Dresser, MD;  Location: [facility] CATH LAB;  Service: Cardiovascular;  Laterality: N/A;  . PERCUTANEOUS CORONARY STENT INTERVENTION (PCI-S)  07/21/2014   Procedure: PERCUTANEOUS CORONARY STENT INTERVENTION (PCI-S);  Surgeon: Larey Dresser, MD;  Location: [facility] CATH LAB;  Service: Cardiovascular;;  . REFRACTIVE SURGERY Bilateral 1990's  . TEE WITHOUT CARDIOVERSION N/A 08/10/2017   Procedure: TRANSESOPHAGEAL ECHOCARDIOGRAM (TEE);  Surgeon: Larey Dresser, MD;  Location: [facility] ENDOSCOPY;  Service: Cardiovascular;  Laterality: N/A;  . TONSILLECTOMY  ~ 1970    Current Outpatient Medications  Medication Sig Dispense Refill  . amiodarone (PACERONE) 200 MG tablet Take 1 tablet (200 mg total) twice daily x 1 week, then decrease to 1 tablet daily.    Marland Kitchen apixaban (ELIQUIS) 5 MG TABS tablet Take 1 tablet (5 mg total) by mouth 2 (two) times daily. 60 tablet 3  . atorvastatin (LIPITOR) 40 MG tablet Take 1 tablet (40 mg total) by mouth at bedtime.    . carvedilol (COREG) 3.125 MG tablet Take 1 tablet (3.125 mg total) by mouth 2 (two) times daily with a meal.    . digoxin (LANOXIN) 0.125 MG tablet Take 1 tablet (0.125 mg total) by mouth daily. 30 tablet 3  . guaiFENesin (MUCINEX) 600 MG 12 hr tablet Take 1 tablet (600 mg total) by mouth 2 (two) times daily as needed for to loosen phlegm.    . metFORMIN (GLUCOPHAGE) 500 MG tablet Take 1 tablet (500 mg total) by mouth 2 (two) times daily with a meal. 60 tablet 3  . mometasone-formoterol (DULERA) 200-5 MCG/ACT AERO Inhale 2 puffs into the lungs 2 (two) times daily. Further refills per PCP 1 Inhaler 0  . nitroGLYCERIN (NITROSTAT) 0.4 MG SL tablet Place 1 tablet (0.4 mg total) under the tongue every 5  (five) minutes as needed for chest pain. 25 tablet 3  . sacubitril-valsartan (ENTRESTO) 24-26 MG Take 1 tablet by mouth 2 (two) times daily. 60 tablet 3  . spironolactone (ALDACTONE) 25 MG tablet Take 1 tablet (25 mg total) by mouth daily.     No current facility-administered medications for this visit.     No Known Allergies  Review of Systems negative except from HPI and PMH  Physical Exam BP 116/66   Pulse 62   Ht _0  (1.778 m)   Wt 200 lb (90.7 kg)   BMI 28.70 kg/m    Well developed and nourished in no acute distress HENT normal Neck supple with JVP-flat Clear Regular rate and rhythm, no murmurs or gallops Abd-soft with active BS No Clubbing cyanosis edema Skin-warm and dry cap refill 2-3  A & Oriented  Grossly normal sensory and motor function   ECG demonstrates  Sinus at 62 ECG 08/10/17 reviewed personally  Typical atrialflutter  Assessment and  Plan  ischemic cardiomyopathy  Congestive heart failure-chronic-systolic  3a  High Risk Medication Surveillance  ICD single chamber  Atrial flutter  Atrial fib post cabg  Pt had atrial flutter on presentation in 2/19  But on device interrogation he had multiple episodes of atrial arrhtyhmia  Have reviewed multiple strips and some and maybe all represent atrial flutter   Discussed with Dr Zada Finders that atrial flutter but also atril fib present.  Could untdertake AFlutter ablation and then stop amio and continue apixoban  With ongoing device monitoring to look for recurrences prior to stopping anticoagulation .  Reviewed with the patient and he is agreeable  We spent more than 50% of our >25 min visit in face to face counseling regarding the above

## 2017-08-22 NOTE — Progress Notes (Signed)
Patient Care Team: Lowne Chase, Yvonne R, DO as PCP - General (Family Medicine) Gerhardt, Lori C, NP as Nurse Practitioner (Nurse Practitioner) Ainsley Deakins C, MD as Consulting Physician (Cardiology) Hall, John, MD (Dermatology)   HPI  Vincent Chandler is a 57 y.o. male Seen in followup for ischemic heart disease and sinus tachycardia. When he was seen last new Q waves were identified. There is a concern about an intercurrent myocardial infarction        Echo 2012 The estimated ejection fraction was in the range of 25% to 30%. Inferior posterior akinesis- Left atrium: The atrium was mildly dilated.   he underwent catheterization 2/14 .demonstrating severe 3 vessel disease with occlusion of the RCA stent. He underwent bypass surgeryx4--complicated by atrial fibrillation with a rapid ventricular response for which he is treated with amiodarone.  No interval Afib    2/16 >>Echo revealed that his EF had dropped further to 20-25%. G3DD. Severe diffuse hypokinesis. He underwent left heart cath revealing 2/4 patent grafts with SVG-PLOM and SVG-RCA system totally occluded. There are collaterals from the LAD system to the RCA and the RCA is totally occluded proximally. There are no collaterals to the LCx territory. He then underwent successful PCI of the mid left circumflex artery with overlapping Synergy drug-eluting stents ;  He is s/p ICD 7/16.   He was admitted 2/19 with congestive heart failure.  Echocardiogram EF 15% he was found to be in atrial flutter-typical.  He was in low output shock and required milrinone for support   He was discharged on amiodarone and Ativan  Given left ventricular low output, evaluation was initiated for LVAD  Feels better today and is now "swearing he will take his meds"  Does not want to have to go to LVAD  No edema, breathing at baseline.  No chest pain   Does not recall palps  No bleeding   No cough or nausea on amiod  Past Medical History:   Diagnosis Date  . Anginal pain ([facility])   . Anxiety   . Asthma   . Atrial fibrillation-postoperative 11/28/2012  . Cardiomyopathy    alcohol use related  . CHF (congestive heart failure) ([facility])   . Chronic back pain   . Coronary artery disease    drug eluting stent RCA 2005-EF 30%- s/p CABG x 4; 2/4 patent grafts with SVG-PLOM and SVG-RCA system totally occluded. There are collaterals from the LAD system to the RCA and the RCA is totally occluded proximally. There are no collaterals to the LCx territory. He then underwent successful PCI of the mid left circumflex artery with overlapping Synergy drug-eluting stents  . Depression   . Diabetes mellitus type II dx'd in the 1990's  . GERD (gastroesophageal reflux disease)   . H/O hiatal hernia   . History of hypogonadism   . Hyperlipidemia   . Hypertension   . Kidney stones   . Migraines    "used to"  . OSA on CPAP    "mask is broken; working on getting a new one" (01/15/2015)  . Pneumonia 3-4 times  . Urinary incontinence     Past Surgical History:  Procedure Laterality Date  . CARDIAC DEFIBRILLATOR PLACEMENT  01/15/2015  . CARDIOVERSION N/A 08/10/2017   Procedure: CARDIOVERSION;  Surgeon: McLean, [name] S, MD;  Location: [facility] ENDOSCOPY;  Service: Cardiovascular;  Laterality: N/A;  . CORONARY ANGIOPLASTY WITH STENT PLACEMENT  ~ 2003  . CORONARY ARTERY BYPASS GRAFT N/A 08/15/2012   Procedure: CORONARY ARTERY BYPASS GRAFTING (  CABG);  Surgeon: Peter Van Trigt, MD;  Location: [facility] OR;  Service: Open Heart Surgery;  Laterality: N/A;  Coronary Artery Bypass Grafting Times Four Using Left Internal Mammary Artery and Right Saphenous Leg Vein Harvested Endoscopically  . EP IMPLANTABLE DEVICE N/A 01/15/2015   Procedure: ICD Implant;  Surgeon: Alessa Mazur C Chimaobi Casebolt, MD;  Location: [facility] INVASIVE CV LAB;  Service: Cardiovascular;  Laterality: N/A;  . LEFT HEART CATHETERIZATION WITH CORONARY ANGIOGRAM N/A 08/08/2012   Procedure: LEFT HEART CATHETERIZATION WITH  CORONARY ANGIOGRAM;  Surgeon: Peter M Jordan, MD;  Location: [facility] CATH LAB;  Service: Cardiovascular;  Laterality: N/A;  . LEFT HEART CATHETERIZATION WITH CORONARY/GRAFT ANGIOGRAM N/A 07/21/2014   Procedure: LEFT HEART CATHETERIZATION WITH CORONARY/GRAFT ANGIOGRAM;  Surgeon: [name] S McLean, MD;  Location: [facility] CATH LAB;  Service: Cardiovascular;  Laterality: N/A;  . PERCUTANEOUS CORONARY STENT INTERVENTION (PCI-S)  07/21/2014   Procedure: PERCUTANEOUS CORONARY STENT INTERVENTION (PCI-S);  Surgeon: [name] S McLean, MD;  Location: [facility] CATH LAB;  Service: Cardiovascular;;  . REFRACTIVE SURGERY Bilateral 1990's  . TEE WITHOUT CARDIOVERSION N/A 08/10/2017   Procedure: TRANSESOPHAGEAL ECHOCARDIOGRAM (TEE);  Surgeon: McLean, [name] S, MD;  Location: [facility] ENDOSCOPY;  Service: Cardiovascular;  Laterality: N/A;  . TONSILLECTOMY  ~ 1970    Current Outpatient Medications  Medication Sig Dispense Refill  . amiodarone (PACERONE) 200 MG tablet Take 1 tablet (200 mg total) twice daily x 1 week, then decrease to 1 tablet daily.    . apixaban (ELIQUIS) 5 MG TABS tablet Take 1 tablet (5 mg total) by mouth 2 (two) times daily. 60 tablet 3  . atorvastatin (LIPITOR) 40 MG tablet Take 1 tablet (40 mg total) by mouth at bedtime.    . carvedilol (COREG) 3.125 MG tablet Take 1 tablet (3.125 mg total) by mouth 2 (two) times daily with a meal.    . digoxin (LANOXIN) 0.125 MG tablet Take 1 tablet (0.125 mg total) by mouth daily. 30 tablet 3  . guaiFENesin (MUCINEX) 600 MG 12 hr tablet Take 1 tablet (600 mg total) by mouth 2 (two) times daily as needed for to loosen phlegm.    . metFORMIN (GLUCOPHAGE) 500 MG tablet Take 1 tablet (500 mg total) by mouth 2 (two) times daily with a meal. 60 tablet 3  . mometasone-formoterol (DULERA) 200-5 MCG/ACT AERO Inhale 2 puffs into the lungs 2 (two) times daily. Further refills per PCP 1 Inhaler 0  . nitroGLYCERIN (NITROSTAT) 0.4 MG SL tablet Place 1 tablet (0.4 mg total) under the tongue every 5  (five) minutes as needed for chest pain. 25 tablet 3  . sacubitril-valsartan (ENTRESTO) 24-26 MG Take 1 tablet by mouth 2 (two) times daily. 60 tablet 3  . spironolactone (ALDACTONE) 25 MG tablet Take 1 tablet (25 mg total) by mouth daily.     No current facility-administered medications for this visit.     No Known Allergies  Review of Systems negative except from HPI and PMH  Physical Exam BP 116/66   Pulse 62   Ht 5' 10" (1.778 m)   Wt 200 lb (90.7 kg)   BMI 28.70 kg/m    Well developed and nourished in no acute distress HENT normal Neck supple with JVP-flat Clear Regular rate and rhythm, no murmurs or gallops Abd-soft with active BS No Clubbing cyanosis edema Skin-warm and dry cap refill 2-3  A & Oriented  Grossly normal sensory and motor function   ECG demonstrates  Sinus at 62 ECG 08/10/17 reviewed personally  Typical atrialflutter      Assessment and  Plan  ischemic cardiomyopathy  Congestive heart failure-chronic-systolic  3a  High Risk Medication Surveillance  ICD single chamber  Atrial flutter  Atrial fib post cabg  Pt had atrial flutter on presentation in 2/19  But on device interrogation he had multiple episodes of atrial arrhtyhmia  Have reviewed multiple strips and some and maybe all represent atrial flutter   Discussed with Dr DMc that atrial flutter but also atril fib present.  Could untdertake AFlutter ablation and then stop amio and continue apixoban  With ongoing device monitoring to look for recurrences prior to stopping anticoagulation .  Reviewed with the patient and he is agreeable  We spent more than 50% of our >25 min visit in face to face counseling regarding the above  

## 2017-08-23 ENCOUNTER — Telehealth (HOSPITAL_COMMUNITY): Payer: Self-pay

## 2017-08-23 ENCOUNTER — Ambulatory Visit (HOSPITAL_COMMUNITY): Payer: Medicare Other | Attending: Cardiology

## 2017-08-23 ENCOUNTER — Telehealth: Payer: Self-pay

## 2017-08-23 DIAGNOSIS — J984 Other disorders of lung: Secondary | ICD-10-CM | POA: Insufficient documentation

## 2017-08-23 DIAGNOSIS — Z01818 Encounter for other preprocedural examination: Secondary | ICD-10-CM | POA: Diagnosis not present

## 2017-08-23 NOTE — Telephone Encounter (Signed)
Called patient to discuss upcoming Aflutter Ablation. Pt aware of the following:  Procedure time is 0830 and he is to be at registration by 0630 that morning. No food or drink after midnight. Only take eliquis the morning of his procedure with a sip of water in case he is in flutter.  He understands he has labs to be drawn March 8th in the office, pre procedure. He will have a follow up with Dr Caryl Comes on [facility] April 25 at 1:45pm.   Pt letter is to follow with all this information as well.   Pt had no additional questions.

## 2017-08-23 NOTE — Telephone Encounter (Signed)
Called pt today to sch appointment however he has CPX test today and then he has clinic appointment Monday so I will  Meet him there Monday for first visit.   Marylouise Stacks, EMT-Paramedic 08/23/17

## 2017-08-24 ENCOUNTER — Other Ambulatory Visit (HOSPITAL_COMMUNITY): Payer: Self-pay | Admitting: Cardiology

## 2017-08-24 ENCOUNTER — Other Ambulatory Visit (HOSPITAL_COMMUNITY): Payer: Self-pay | Admitting: *Deleted

## 2017-08-24 DIAGNOSIS — I5022 Chronic systolic (congestive) heart failure: Secondary | ICD-10-CM

## 2017-08-27 ENCOUNTER — Ambulatory Visit (HOSPITAL_COMMUNITY)
Admission: RE | Admit: 2017-08-27 | Discharge: 2017-08-27 | Disposition: A | Discharge status: Home / Self Care | Payer: Medicare Other | Source: Ambulatory Visit | Attending: Cardiology | Admitting: Cardiology

## 2017-08-27 ENCOUNTER — Other Ambulatory Visit (HOSPITAL_COMMUNITY): Payer: Self-pay

## 2017-08-27 ENCOUNTER — Ambulatory Visit (HOSPITAL_BASED_OUTPATIENT_CLINIC_OR_DEPARTMENT_OTHER)
Admission: RE | Admit: 2017-08-27 | Discharge: 2017-08-27 | Disposition: A | Discharge status: Home / Self Care | Payer: Medicare Other | Source: Ambulatory Visit | Attending: Cardiology | Admitting: Cardiology

## 2017-08-27 ENCOUNTER — Encounter (HOSPITAL_COMMUNITY): Payer: Self-pay

## 2017-08-27 ENCOUNTER — Telehealth (HOSPITAL_COMMUNITY): Payer: Self-pay | Admitting: *Deleted

## 2017-08-27 VITALS — BP 128/70 | HR 63 | Wt 201.8 lb

## 2017-08-27 DIAGNOSIS — F329 Major depressive disorder, single episode, unspecified: Secondary | ICD-10-CM | POA: Insufficient documentation

## 2017-08-27 DIAGNOSIS — Z79899 Other long term (current) drug therapy: Secondary | ICD-10-CM | POA: Insufficient documentation

## 2017-08-27 DIAGNOSIS — I2510 Atherosclerotic heart disease of native coronary artery without angina pectoris: Secondary | ICD-10-CM | POA: Diagnosis not present

## 2017-08-27 DIAGNOSIS — I10 Essential (primary) hypertension: Secondary | ICD-10-CM | POA: Diagnosis not present

## 2017-08-27 DIAGNOSIS — I509 Heart failure, unspecified: Secondary | ICD-10-CM | POA: Diagnosis not present

## 2017-08-27 DIAGNOSIS — I5022 Chronic systolic (congestive) heart failure: Secondary | ICD-10-CM | POA: Insufficient documentation

## 2017-08-27 DIAGNOSIS — I130 Hypertensive heart and chronic kidney disease with heart failure and stage 1 through stage 4 chronic kidney disease, or unspecified chronic kidney disease: Secondary | ICD-10-CM | POA: Insufficient documentation

## 2017-08-27 DIAGNOSIS — I255 Ischemic cardiomyopathy: Secondary | ICD-10-CM | POA: Diagnosis not present

## 2017-08-27 DIAGNOSIS — E119 Type 2 diabetes mellitus without complications: Secondary | ICD-10-CM | POA: Insufficient documentation

## 2017-08-27 DIAGNOSIS — N183 Chronic kidney disease, stage 3 unspecified: Secondary | ICD-10-CM

## 2017-08-27 DIAGNOSIS — I4892 Unspecified atrial flutter: Secondary | ICD-10-CM

## 2017-08-27 DIAGNOSIS — Z7901 Long term (current) use of anticoagulants: Secondary | ICD-10-CM | POA: Diagnosis not present

## 2017-08-27 DIAGNOSIS — I480 Paroxysmal atrial fibrillation: Secondary | ICD-10-CM | POA: Insufficient documentation

## 2017-08-27 DIAGNOSIS — G4733 Obstructive sleep apnea (adult) (pediatric): Secondary | ICD-10-CM | POA: Insufficient documentation

## 2017-08-27 DIAGNOSIS — Z95810 Presence of automatic (implantable) cardiac defibrillator: Secondary | ICD-10-CM

## 2017-08-27 DIAGNOSIS — E1122 Type 2 diabetes mellitus with diabetic chronic kidney disease: Secondary | ICD-10-CM | POA: Insufficient documentation

## 2017-08-27 LAB — BASIC METABOLIC PANEL
ANION GAP: 9 (ref 5–15)
BUN: 51 mg/dL — ABNORMAL HIGH (ref 6–20)
CO2: 17 mmol/L — AB (ref 22–32)
Calcium: 9.8 mg/dL (ref 8.9–10.3)
Chloride: 109 mmol/L (ref 101–111)
Creatinine, Ser: 1.72 mg/dL — ABNORMAL HIGH (ref 0.61–1.24)
GFR calc non Af Amer: 43 mL/min — ABNORMAL LOW (ref >60)
GFR, EST AFRICAN AMERICAN: 49 mL/min — AB (ref >60)
GLUCOSE: 129 mg/dL — AB (ref 65–99)
Potassium: 5.4 mmol/L — ABNORMAL HIGH (ref 3.5–5.1)
Sodium: 135 mmol/L (ref 135–145)

## 2017-08-27 MED ORDER — SACUBITRIL-VALSARTAN 49-51 MG PO TABS: 1.0000 | ORAL_TABLET | Freq: Two times a day (BID) | ORAL | 3 refills | Status: DC

## 2017-08-27 MED ORDER — AMIODARONE HCL 200 MG PO TABS: 200.0000 mg | ORAL_TABLET | Freq: Every day | ORAL | 3 refills | Status: DC

## 2017-08-27 MED ORDER — SPIRONOLACTONE 25 MG PO TABS: 12.5000 mg | ORAL_TABLET | Freq: Every day | ORAL | 3 refills | Status: DC

## 2017-08-27 NOTE — Progress Notes (Signed)
Pre-op Cardiac Surgery  Carotid Findings:  1-39% ICA plaquing.  Vertebral artery flow is antegrade.     Findings:      Lower  Extremity Right Left  Dorsalis Pedis 139T 140T  Anterior Tibial T T  Posterior Tibial T T  Ankle/Brachial Indices 1.06 1.17    Findings:  ABIs and great toe pressures are WNL

## 2017-08-27 NOTE — Progress Notes (Signed)
CSW met with patient in the clinic to discuss LVAD assessment. Patient is a single man with a sister and brother as his support. Patient shared limited options and that his brother mentioned "hiring help" as a caregiver. CSW spoke at length about concerns of hired help for caregiving needs with an LVAD. Patient states his sister can stay and in the past remained with him for 3 months post his stent surgery. CSW started discussions of LVAD assessment although patient had another appointment and unable to complete. CSW will meet with patient again to follow up and complete full LVAD assessment. Patient verbalizes understanding. Raquel Sarna, [place], [place]

## 2017-08-27 NOTE — Progress Notes (Signed)
IOM:BTDH Primary HF Cardiologist: Dr [name]  EP: Dr Caryl Comes   HPI: Mr Yerger is a 57 year old with history of A flutter, chronic systolic heart failure, CAD S/P CABG, CKD Stage III, DMII, OSA.   Admitted 2/5 with A fib RVR and a/c systolic heart failure. Underwent successful DC/CV on 2/8. Required short term milrinone but was able to wean off. HF meds started.  D/C weight 201 pounds.   Today he returns for HF follow up. He saw Dr Caryl Comes last week for A flutter and he has plans for an ablation on 09/12/17. Overall feeling great. Able [place] difficulty.  Denies SOB/PND/Orthopnea. Having some dizziness when he stands. Appetite ok. No fever or chills. Weight at home 200 pounds. Appetite improving. Taking all medications. Has difficulty paying for medications. Lives alone. He does not smoke or drink alcohol.   Medtronic: No Fib over the last few weeks. Acitivity trending up. Fluid well below fluid threshold.   CPX 08/24/2017  Peak VO2: 12.2 (41% predicted peak VO2) VE/VCO2 slope: 53 OUES: 1.18 Peak RER: 1.07 Severe HF limitation with high risk features.   Echo 08/09/2017   EF 15%, mildly dilated LV, mild LVH, inferolateral akinesis, milr MR, mildly dilated RV with severely reduced systolic function.   TEE 08/10/2017 Mildly dilated LV with EF 15%, diffuse hypokinesis, mildly dilated RV with severely decreased systolic function.     ROS: All systems negative except as listed in HPI, PMH and Problem List.  SH:  Social History   Socioeconomic History  . Marital status: Divorced    Spouse name: n/a  . Number of children: 0  . Years of education: 12th grade  . Highest education level: Not on file  Social Needs  . Financial resource strain: Not on file  . Food insecurity - worry: Not on file  . Food insecurity - inability: Not on file  . Transportation needs - medical: Not on file  . Transportation needs - non-medical: Not on file  Occupational History  .  Occupation: Data processing manager ---self employed    Employer: [employer]  Tobacco Use  . Smoking status: Never Smoker  . Smokeless tobacco: Current User    Types: Chew  Substance and Sexual Activity  . Alcohol use: Yes    Alcohol/week: 3.6 oz    Types: 6 Shots of liquor per week  . Drug use: No  . Sexual activity: Yes    Partners: Female  Other Topics Concern  . Not on file  Social History Narrative   Exercise-- walking    Lives alone.   Brother lives in [place], [place]    FH:  Family History  Problem Relation Age of Onset  . Hypertension Mother   . Diabetes Mother   . Hypertension Father   . Diabetes Father   . Hyperlipidemia Father     Past Medical History:  Diagnosis Date  . Anginal pain ([place])   . Anxiety   . Asthma   . Atrial fibrillation-postoperative 11/28/2012  . Cardiomyopathy    alcohol use related  . CHF (congestive heart failure) ([place])   . Chronic back pain   . Coronary artery disease    drug eluting stent RCA 2005-EF 30%- s/p CABG x 4; 2/4 patent grafts with SVG-PLOM and SVG-RCA system totally occluded. There are collaterals from the LAD system to the RCA and the RCA is totally occluded proximally. There are no collaterals to the LCx territory. He then underwent successful  PCI of the mid left circumflex artery with overlapping Synergy drug-eluting stents  . Depression   . Diabetes mellitus type II dx'd in the 1990's  . GERD (gastroesophageal reflux disease)   . H/O hiatal hernia   . History of hypogonadism   . Hyperlipidemia   . Hypertension   . Kidney stones   . Migraines    "used to"  . OSA on CPAP    "mask is broken; working on getting a new one" (01/15/2015)  . Pneumonia 3-4 times  . Urinary incontinence     Current Outpatient Medications  Medication Sig Dispense Refill  . amiodarone (PACERONE) 200 MG tablet Take 1 tablet (200 mg total) twice daily x 1 week, then decrease to 1 tablet daily.    Marland Kitchen apixaban (ELIQUIS)  5 MG TABS tablet Take 1 tablet (5 mg total) by mouth 2 (two) times daily. 60 tablet 3  . atorvastatin (LIPITOR) 40 MG tablet Take 1 tablet (40 mg total) by mouth at bedtime.    . carvedilol (COREG) 3.125 MG tablet Take 1 tablet (3.125 mg total) by mouth 2 (two) times daily with a meal.    . digoxin (LANOXIN) 0.125 MG tablet Take 1 tablet (0.125 mg total) by mouth daily. 30 tablet 3  . guaiFENesin (MUCINEX) 600 MG 12 hr tablet Take 1 tablet (600 mg total) by mouth 2 (two) times daily as needed for to loosen phlegm.    . metFORMIN (GLUCOPHAGE) 500 MG tablet Take 1 tablet (500 mg total) by mouth 2 (two) times daily with a meal. 60 tablet 3  . mometasone-formoterol (DULERA) 200-5 MCG/ACT AERO Inhale 2 puffs into the lungs 2 (two) times daily. Further refills per PCP 1 Inhaler 0  . nitroGLYCERIN (NITROSTAT) 0.4 MG SL tablet Place 1 tablet (0.4 mg total) under the tongue every 5 (five) minutes as needed for chest pain. 25 tablet 3  . sacubitril-valsartan (ENTRESTO) 24-26 MG Take 1 tablet by mouth 2 (two) times daily. 60 tablet 3  . spironolactone (ALDACTONE) 25 MG tablet Take 1 tablet (25 mg total) by mouth daily.     No current facility-administered medications for this encounter.     Vitals:   08/27/17 1159  BP: 128/70  Pulse: 63  SpO2: 91%  Weight: 201 lb 12.8 oz (91.5 kg)    PHYSICAL EXAM: General:  Well appearing. No resp difficulty. Walked in the clinic without difficulty.  HEENT: normal Neck: supple. JVP 5-6. Carotids 2+ bilaterally; no bruits. No lymphadenopathy or thryomegaly appreciated. Cor: PMI normal. Regular rate & rhythm. No rubs, gallops or murmurs. Lungs: clear Abdomen: soft, nontender, nondistended. No hepatosplenomegaly. No bruits or masses. Good bowel sounds. Extremities: no cyanosis, clubbing, rash, edema Neuro: alert & orientedx3, cranial nerves grossly intact. Moves all 4 extremities w/o difficulty. Affect pleasant.   ASSESSMENT & PLAN: 1. Chronic Systolic Heart  Failure , ICM . Medtronic ICD single chamber -ECHO 2/7/019 EF 15% Mildly dilated RV. Repeat ECHO in 3 months after HF meds optimized.  CPX with Slope 53 VO2 12 RER 1.07.  Severe HF limitations.  NYHA II. Functionally doing ok for now will likely need advanced therapies. VAD work up has been started.  Referred to [facility] for VAD assessment today. He has an appointment with Dr Darcey Nora.  Volume status stable. Does not require lasix.  Continue low dose carvedilol 3.125 mg twice a day. Continue digoxin 0.125 mg daily.  Increase entresto 97-103 mg twice a day Continue spironolactone 25 mg dialy Check BMET today  2. PAF Fib/FLutter  S/P DC-CV 2/8. Cut back amio to 200 mg daily. Continue eliquis 5 mg twice a day.  - He will need yearly eye exams and TSH/LFTs every 6 months.  Saw Dr Caryl Comes 2/22 with plans for an ablation.  3. CAD S/P CABG. 07/21/2014 PCI mid left circumflex with overlapping DES.  No S/S ischemia. On eliquis. On low dose bb and statin.   4. CKD Stage III Check BMET today.   5. OSA Used CPAP in the past. Refer to Dr Radford Pax for CPAP.   6. H/O VT No VT noted on device interrogation today.  7. DMII Hgb A1C 8.7 . Continue metoformin. Needs PCP follow up.   Follow up in 2 weeks. Continue Paramedicine.   Amy Clegg NP-C  3:50 PM  Labs today. K5.4.Creatinine 1.7. He is not on potassium. Cut back entresto to 49-51 mg twice a day. Cut back spiro to 12.5 mg daily. He was called an asked to eliminate high potassium foods. Repeat BMET next week.   Amy Clegg NP-C  3:51 PM

## 2017-08-27 NOTE — Progress Notes (Signed)
VASCULAR LAB PRELIMINARY  PRELIMINARY  PRELIMINARY  PRELIMINARY  Bilateral lower extremity venous duplex completed.    Preliminary report:  There is no DVT or SVT noted in the bilateral lower extremities.  There is sluggish flow noted in the bilateral popliteal veins.   Arnold Kester, RVT 08/27/2017, 2:31 PM

## 2017-08-27 NOTE — Telephone Encounter (Signed)
Result Notes for Basic Metabolic Panel (BMET)   Notes recorded by Darron Doom, RN on 08/27/2017 at 4:02 PM EST Spoke with patient and he is agreeable with plan. Diet needs discussed as well. Medications updated, bmet ordered, and lab appt scheduled. ------  Notes recorded by Darron Doom, RN on 08/27/2017 at 3:26 PM EST Called but no answer and unable to leave VM, will continue trying to reach patient. ------  Notes recorded by Darrick Grinder D, NP on 08/27/2017 at 3:22 PM EST Please call him and ask him to eliminate high potassium foods. Cut entresto back to 49-51 mg twice a day. Cut back spironolactone to 12.5 mg daily. Repeat BMET next week.

## 2017-08-27 NOTE — Progress Notes (Signed)
Paramedicine Encounter   Patient ID: Vincent Chandler , male,   DOB: 02-14-1961,57 y.o.,  MRN: [id number]   Met patient in clinic today with provider.  Time spent with patient   Pt reports he is feeling good today, he states he was able to walk [place] without any difficulty.  He failed his exercise test.  He has orthostatic dizziness.  Weight @ clinic-201 Weight @ home-200-201 Good appetite, small portions at meals.  He denies any bleeding issues.  States he follows low sodium/low sugar diet.  Keeping below 2L fluid.  amio needs to be one tab daily.  He lives home alone. He has cousin and brother that live locally. Sister in [place].  Being worked up for possible LVAD.  Disability in December.  States he has all his meds.  Medicare-missed his enrollment for part D-he went to get medicaid but his income was too much unless he can show $8k in unpaid med bills. He states he is going to financial dept after this appointment to see about his bills.  entresto will be increased to 49/51. Given samples today.  He is sch to have ablasion for his fib/flutter.    Marylouise Stacks, EMT-Paramedic 08/27/2017   ACTION: Home visit completed

## 2017-08-27 NOTE — Patient Instructions (Addendum)
Labs today (will call for abnormal results, otherwise no news is good news)  INCREASE Entresto to 49-51 mg (1 Tablet) Twice Daily  DECREASE Amiodarone to 200 mg (1 Tablet) Once Daily  Follow up in 2 weeks.

## 2017-08-27 NOTE — Progress Notes (Signed)
Medication Samples have been provided to the patient.  Drug name: Delene Loll     Strength: 49-51 mg     Qty: 2  LOT: [id number] Exp.Date: 1/21  Dosing instructions: Take 1 Tablet Twice Daily  The patient has been instructed regarding the correct time, dose, and frequency of taking this medication, including desired effects and most common side effects.   Darron Doom 1:45 PM 08/27/2017

## 2017-08-28 ENCOUNTER — Other Ambulatory Visit (HOSPITAL_COMMUNITY): Payer: Self-pay | Admitting: Pharmacist

## 2017-08-28 ENCOUNTER — Encounter: Payer: Self-pay | Admitting: Licensed Clinical Social Worker

## 2017-08-28 ENCOUNTER — Ambulatory Visit (HOSPITAL_COMMUNITY)
Admission: RE | Admit: 2017-08-28 | Discharge: 2017-08-28 | Disposition: A | Discharge status: Home / Self Care | Payer: Medicare Other | Source: Ambulatory Visit | Attending: Cardiology | Admitting: Cardiology

## 2017-08-28 ENCOUNTER — Encounter (HOSPITAL_COMMUNITY): Payer: Self-pay | Admitting: Pharmacist

## 2017-08-28 DIAGNOSIS — Z01818 Encounter for other preprocedural examination: Secondary | ICD-10-CM | POA: Insufficient documentation

## 2017-08-28 DIAGNOSIS — R05 Cough: Secondary | ICD-10-CM | POA: Diagnosis not present

## 2017-08-28 DIAGNOSIS — J984 Other disorders of lung: Secondary | ICD-10-CM | POA: Insufficient documentation

## 2017-08-28 LAB — PULMONARY FUNCTION TEST
DL/VA % pred: 45 %
DL/VA: 2.06 ml/min/mmHg/L
DLCO UNC: 8.35 ml/min/mmHg
DLCO cor % pred: 26 %
DLCO cor: 8.06 ml/min/mmHg
DLCO unc % pred: 26 %
FEF 25-75 Post: 3.44 L/sec
FEF 25-75 Pre: 2.72 L/sec
FEF2575-%Change-Post: 26 %
FEF2575-%Pred-Post: 111 %
FEF2575-%Pred-Pre: 88 %
FEV1-%Change-Post: 1 %
FEV1-%PRED-POST: 68 %
FEV1-%Pred-Pre: 67 %
FEV1-POST: 2.47 L
FEV1-Pre: 2.44 L
FEV1FVC-%CHANGE-POST: 2 %
FEV1FVC-%Pred-Pre: 110 %
FEV6-%Change-Post: -1 %
FEV6-%PRED-PRE: 63 %
FEV6-%Pred-Post: 63 %
FEV6-POST: 2.86 L
FEV6-PRE: 2.89 L
FEV6FVC-%PRED-POST: 104 %
FEV6FVC-%PRED-PRE: 104 %
FVC-%Change-Post: -1 %
FVC-%PRED-POST: 60 %
FVC-%PRED-PRE: 61 %
FVC-POST: 2.86 L
FVC-PRE: 2.89 L
POST FEV6/FVC RATIO: 100 %
PRE FEV1/FVC RATIO: 84 %
Post FEV1/FVC ratio: 86 %
Pre FEV6/FVC Ratio: 100 %
RV % pred: 54 %
RV: 1.14 L
TLC % PRED: 60 %
TLC: 4.09 L

## 2017-08-28 MED ORDER — ALBUTEROL SULFATE (2.5 MG/3ML) 0.083% IN NEBU
2.5000 mg | INHALATION_SOLUTION | Freq: Once | RESPIRATORY_TRACT | Status: AC
  Administered 2017-08-28: 2.5 mg via RESPIRATORY_TRACT

## 2017-08-28 MED ORDER — EMPAGLIFLOZIN 10 MG PO TABS: 10.0000 mg | ORAL_TABLET | Freq: Every day | ORAL | 3 refills | Status: DC

## 2017-08-28 MED ORDER — APIXABAN 5 MG PO TABS: 5.0000 mg | ORAL_TABLET | Freq: Two times a day (BID) | ORAL | 3 refills | Status: DC

## 2017-08-28 MED ORDER — SACUBITRIL-VALSARTAN 49-51 MG PO TABS: 1.0000 | ORAL_TABLET | Freq: Two times a day (BID) | ORAL | 11 refills | Status: DC

## 2017-08-30 ENCOUNTER — Ambulatory Visit (HOSPITAL_COMMUNITY): Payer: Self-pay | Admitting: Dentistry

## 2017-08-30 ENCOUNTER — Encounter (HOSPITAL_COMMUNITY): Payer: Self-pay | Admitting: Dentistry

## 2017-08-30 ENCOUNTER — Telehealth (HOSPITAL_COMMUNITY): Payer: Self-pay | Admitting: Unknown Physician Specialty

## 2017-08-30 VITALS — BP 125/65 | HR 61 | Temp 97.7°F

## 2017-08-30 DIAGNOSIS — K06010 Localized gingival recession, unspecified: Secondary | ICD-10-CM

## 2017-08-30 DIAGNOSIS — M2629 Other anomalies of dental arch relationship: Secondary | ICD-10-CM

## 2017-08-30 DIAGNOSIS — M264 Malocclusion, unspecified: Secondary | ICD-10-CM

## 2017-08-30 DIAGNOSIS — K030 Excessive attrition of teeth: Secondary | ICD-10-CM

## 2017-08-30 DIAGNOSIS — K08409 Partial loss of teeth, unspecified cause, unspecified class: Secondary | ICD-10-CM

## 2017-08-30 DIAGNOSIS — K0530 Chronic periodontitis, unspecified: Secondary | ICD-10-CM

## 2017-08-30 DIAGNOSIS — K083 Retained dental root: Secondary | ICD-10-CM | POA: Insufficient documentation

## 2017-08-30 DIAGNOSIS — K029 Dental caries, unspecified: Secondary | ICD-10-CM

## 2017-08-30 DIAGNOSIS — K036 Deposits [accretions] on teeth: Secondary | ICD-10-CM

## 2017-08-30 DIAGNOSIS — Z01818 Encounter for other preprocedural examination: Secondary | ICD-10-CM

## 2017-08-30 DIAGNOSIS — I5022 Chronic systolic (congestive) heart failure: Secondary | ICD-10-CM

## 2017-08-30 NOTE — Telephone Encounter (Signed)
Spoke with Dr. Enrique Sack today. Pt needs teeth extractions prior to VAD placement. We will need to coordinate teeth extractions with GI procedure. Per Dr. Enrique Sack pt will need to hold Eliquis prior to procedure. VAD coordinators will continue to follow the progression of his multiple appointments/procedures closely to coordinate teeth extractions for Dr. Enrique Sack.  Tanda Rockers RN, BSN VAD Coordinator 24/7 Pager [phone]

## 2017-08-30 NOTE — Progress Notes (Signed)
DENTAL CONSULTATION  Date of Consultation:  08/30/2017 Patient Name:   Vincent Chandler Date of Birth:   22-Apr-1961 Medical Record Number: [id number]  VITALS: BP 125/65 (BP Location: Right Arm)   Pulse 61   Temp 97.7 F (36.5 C)   CHIEF COMPLAINT: Patient was referred by Dr. Loralie Champagne and Dr. Tharon Aquas Trigt for a dental consultation.  HPI: Vincent Chandler is a 57 year old male with chronic systolic heart failure with anticipated LVAD placement. Patient is now seen as part of a pre-LVAD placement dental protocol examination to rule out dental infection that may affect the patient's systemic health and anticipated LVAD placement.  The patient currently denies acute toothaches, swellings, or abscesses. The patient was last seen by his dentist approximately 3 months ago for a dental cleaning and scaling and root planing of the lower left quadrant.  This was with Dr. Marisa Cyphers in [place], [place].  Prior to that, the patient had been seen in 2014 to have an extraction prior to the anticipated CABG surgery. Patient denies any complications from that dental extraction. Patient denies having partial dentures. Patient denies having dental phobia.  PROBLEM LIST: Patient Active Problem List   Diagnosis Date Noted  . Atrial flutter ([name]) 08/08/2017  . Dyspnea 08/07/2017  . Chronic systolic heart failure ([place]) 08/07/2017  . Hyperglycemia 08/07/2017  . Lower respiratory infection 08/07/2017  . Screening for colon cancer 08/07/2017  . CKD (chronic kidney disease) 2-3 07/19/2014  . NSTEMI (non-ST elevated myocardial infarction) ([place]) 07/18/2014  . OSA (obstructive sleep apnea) 07/18/2014  . Tachycardia   . V tach ([place]) 07/17/2014  . Hypoxemia 07/17/2014  . CHF (congestive heart failure) ([place]) 06/04/2012  . Sinus tachycardia 12/29/2010  . Coronary artery disease prior RCA stent with new inferior Q waves 10/18/2010  . Ischemic and nonischemic cardiomyopathy   10/18/2010  .  Uncontrolled type 2 diabetes mellitus without complication, without long-term current use of insulin ([place]) 03/10/2010  . Hyperlipidemia 03/10/2010  . Essential hypertension 03/10/2010    PMH: Past Medical History:  Diagnosis Date  . Anginal pain ([place])   . Anxiety   . Asthma   . Atrial fibrillation-postoperative 11/28/2012  . Cardiomyopathy    alcohol use related  . CHF (congestive heart failure) ([place])   . Chronic back pain   . Coronary artery disease    drug eluting stent RCA 2005-EF 30%- s/p CABG x 4; 2/4 patent grafts with SVG-PLOM and SVG-RCA system totally occluded. There are collaterals from the LAD system to the RCA and the RCA is totally occluded proximally. There are no collaterals to the LCx territory. He then underwent successful PCI of the mid left circumflex artery with overlapping Synergy drug-eluting stents  . Depression   . Diabetes mellitus type II dx'd in the 1990's  . GERD (gastroesophageal reflux disease)   . H/O hiatal hernia   . History of hypogonadism   . Hyperlipidemia   . Hypertension   . Kidney stones   . Migraines    "used to"  . OSA on CPAP    "mask is broken; working on getting a new one" (01/15/2015)  . Pneumonia 3-4 times  . Urinary incontinence     PSH: Past Surgical History:  Procedure Laterality Date  . CARDIAC DEFIBRILLATOR PLACEMENT  01/15/2015  . CARDIOVERSION N/A 08/10/2017   Procedure: CARDIOVERSION;  Surgeon: Larey Dresser, MD;  Location: [facility] ENDOSCOPY;  Service: Cardiovascular;  Laterality: N/A;  . CORONARY ANGIOPLASTY WITH STENT PLACEMENT  ~ 2003  .  CORONARY ARTERY BYPASS GRAFT N/A 08/15/2012   Procedure: CORONARY ARTERY BYPASS GRAFTING (CABG);  Surgeon: Ivin Poot, MD;  Location: [place];  Service: Open Heart Surgery;  Laterality: N/A;  Coronary Artery Bypass Grafting Times Four Using Left Internal Mammary Artery and Right Saphenous Leg Vein Harvested Endoscopically  . EP IMPLANTABLE DEVICE N/A 01/15/2015   Procedure: ICD Implant;   Surgeon: Deboraha Sprang, MD;  Location: [facility] CV LAB;  Service: Cardiovascular;  Laterality: N/A;  . LEFT HEART CATHETERIZATION WITH CORONARY ANGIOGRAM N/A 08/08/2012   Procedure: LEFT HEART CATHETERIZATION WITH CORONARY ANGIOGRAM;  Surgeon: Peter M Martinique, MD;  Location: [facility] CATH LAB;  Service: Cardiovascular;  Laterality: N/A;  . LEFT HEART CATHETERIZATION WITH CORONARY/GRAFT ANGIOGRAM N/A 07/21/2014   Procedure: LEFT HEART CATHETERIZATION WITH Beatrix Fetters;  Surgeon: Larey Dresser, MD;  Location: [facility] CATH LAB;  Service: Cardiovascular;  Laterality: N/A;  . PERCUTANEOUS CORONARY STENT INTERVENTION (PCI-S)  07/21/2014   Procedure: PERCUTANEOUS CORONARY STENT INTERVENTION (PCI-S);  Surgeon: Larey Dresser, MD;  Location: [facility] CATH LAB;  Service: Cardiovascular;;  . REFRACTIVE SURGERY Bilateral 1990's  . TEE WITHOUT CARDIOVERSION N/A 08/10/2017   Procedure: TRANSESOPHAGEAL ECHOCARDIOGRAM (TEE);  Surgeon: Larey Dresser, MD;  Location: [facility] ENDOSCOPY;  Service: Cardiovascular;  Laterality: N/A;  . TONSILLECTOMY  ~ 1970    ALLERGIES: No Known Allergies  MEDICATIONS: Current Outpatient Medications  Medication Sig Dispense Refill  . amiodarone (PACERONE) 200 MG tablet Take 1 tablet (200 mg total) by mouth daily. 30 tablet 3  . apixaban (ELIQUIS) 5 MG TABS tablet Take 1 tablet (5 mg total) by mouth 2 (two) times daily. 180 tablet 3  . atorvastatin (LIPITOR) 40 MG tablet Take 1 tablet (40 mg total) by mouth at bedtime.    . carvedilol (COREG) 3.125 MG tablet Take 1 tablet (3.125 mg total) by mouth 2 (two) times daily with a meal.    . digoxin (LANOXIN) 0.125 MG tablet Take 1 tablet (0.125 mg total) by mouth daily. 30 tablet 3  . metFORMIN (GLUCOPHAGE) 500 MG tablet Take 1 tablet (500 mg total) by mouth 2 (two) times daily with a meal. 60 tablet 3  . mometasone-formoterol (DULERA) 200-5 MCG/ACT AERO Inhale 2 puffs into the lungs 2 (two) times daily. Further refills per PCP 1 Inhaler 0   . sacubitril-valsartan (ENTRESTO) 49-51 MG Take 1 tablet by mouth 2 (two) times daily. 60 tablet 11  . spironolactone (ALDACTONE) 25 MG tablet Take 0.5 tablets (12.5 mg total) by mouth daily. 15 tablet 3  . empagliflozin (JARDIANCE) 10 MG TABS tablet Take 10 mg by mouth daily. (Patient not taking: Reported on 08/30/2017) 90 tablet 3  . guaiFENesin (MUCINEX) 600 MG 12 hr tablet Take 1 tablet (600 mg total) by mouth 2 (two) times daily as needed for to loosen phlegm. (Patient not taking: Reported on 08/30/2017)    . nitroGLYCERIN (NITROSTAT) 0.4 MG SL tablet Place 1 tablet (0.4 mg total) under the tongue every 5 (five) minutes as needed for chest pain. (Patient not taking: Reported on 08/30/2017) 25 tablet 3   No current facility-administered medications for this visit.     LABS: Lab Results  Component Value Date   WBC 6.4 08/17/2017   HGB 15.9 08/17/2017   HCT 47.6 08/17/2017   MCV 110.4 (H) 08/17/2017   PLT 218 08/17/2017      Component Value Date/Time   NA 135 08/27/2017 1227   NA 138 07/12/2016 1603   K 5.4 (H) 08/27/2017 1227  CL 109 08/27/2017 1227   CO2 17 (L) 08/27/2017 1227   GLUCOSE 129 (H) 08/27/2017 1227   BUN 51 (H) 08/27/2017 1227   BUN 26 (H) 07/12/2016 1603   CREATININE 1.72 (H) 08/27/2017 1227   CREATININE 1.63 (H) 03/22/2016 1454   CALCIUM 9.8 08/27/2017 1227   GFRNONAA 43 (L) 08/27/2017 1227   GFRAA 49 (L) 08/27/2017 1227   Lab Results  Component Value Date   INR 1.32 08/17/2017   INR 1.06 08/07/2017   INR 1.02 07/21/2014   No results found for: PTT  SOCIAL HISTORY: Social History   Socioeconomic History  . Marital status: Divorced    Spouse name: n/a  . Number of children: 0  . Years of education: 12th grade  . Highest education level: Not on file  Social Needs  . Financial resource strain: Not on file  . Food insecurity - worry: Not on file  . Food insecurity - inability: Not on file  . Transportation needs - medical: Not on file  .  Transportation needs - non-medical: Not on file  Occupational History  . Occupation: Data processing manager ---self employed    Employer: [employer]  Tobacco Use  . Smoking status: Never Smoker  . Smokeless tobacco: Current User    Types: Chew  Substance and Sexual Activity  . Alcohol use: Yes    Alcohol/week: 3.6 oz    Types: 6 Shots of liquor per week  . Drug use: No  . Sexual activity: Yes    Partners: Female  Other Topics Concern  . Not on file  Social History Narrative   Exercise-- walking    Lives alone.   Brother lives in [place], [place]    FAMILY HISTORY: Family History  Problem Relation Age of Onset  . Hypertension Mother   . Diabetes Mother   . Hypertension Father   . Diabetes Father   . Hyperlipidemia Father     REVIEW OF SYSTEMS: Reviewed with the patient as per History of present illness. Psych: Patient denies having dental phobia.  DENTAL HISTORY: CHIEF COMPLAINT: Patient was referred by Dr. Loralie Champagne and Dr. Tharon Aquas Trigt for a dental consultation.  HPI: Vincent Chandler is a 57 year old male with chronic systolic heart failure with anticipated LVAD placement. Patient is now seen as part of a pre-LVAD placement dental protocol examination to rule out dental infection that may affect the patient's systemic health and anticipated LVAD placement.  The patient currently denies acute toothaches, swellings, or abscesses. The patient was last seen by his dentist approximately 3 months ago for a dental cleaning and scaling and root planing of the lower left quadrant.  This was with Dr. Marisa Cyphers in [place], [place].  Prior to that, the patient had been seen in 2014 to have an extraction prior to the anticipated CABG surgery. Patient denies any complications from that dental extraction. Patient denies having partial dentures. Patient denies having dental phobia.  DENTAL EXAMINATION: GENERAL:  The patient is a  well-developed, well-nourished male in no acute distress. HEAD AND NECK:  There is no palpable neck lymphadenopathy. The patient denies acute TMJ symptoms. INTRAORAL EXAM:  The patient has normal saliva. There is no evidence of oral abscess formation. DENTITION:  Patient is missing tooth numbers 1, 12, 16, 17, and 32. Tooth #5 is present as a retained root segment. PERIODONTAL:  The patient has chronic periodontitis with plaque and calculus accumulations, selective areas of gingival recession, and incipient to moderate bone loss.  DENTAL CARIES/SUBOPTIMAL RESTORATIONS:  Multiple dental caries are noted as per dental charting form. ENDODONTIC:  The patient currently denies acute pulpitis symptoms. Patient does have dental caries impinging of the pulp involving tooth #14. CROWN AND BRIDGE:  Patient has full gold crowns on tooth numbers 19, 30, and 31. PROSTHODONTIC:  Patient denies having partial dentures. OCCLUSION:  The patient has a poor occlusal scheme secondary to multiple missing teeth, retained root segment #5, and lack of replacement of the missing teeth with dental prostheses.  RADIOGRAPHIC INTERPRETATION: An orthopantogram was taken at [facility] on 08/16/2017. This was supplemented with a full series of dental radiographs-today. There are multiple missing teeth. There is retained root segment in the area #5. There is no obvious periapical radiolucency. Patient has dental caries. The caries on tooth #14 is impinging on the pulp. The patient has evidence of incisal attrition.    ASSESSMENTS: 1.Chronic systolic heart failure 2. Cardiomyopathy 3. Pre-LVAD placement dental protocol 4. Retained root segment 5. Dental caries 6. Chronic periodontitis with bone loss 7. Gingival recession 8. Accretions 9. Multiple missing teeth 10. Deep overbite 11. Poor occlusal scheme and malocclusion  12. Risk for bleeding with invasive dental procedures due to current Eliquis therapy 3. Risk  for complications up to and including death with anticipated invasive dental procedures in the operating room with general anesthesia was overall cardiovascular compromise.   PLAN/RECOMMENDATIONS: 1. I discussed the risks, benefits, and complications of various treatment options with the patient in relationship to his medical and dental conditions, anticipated LVAD placement, and risk for infection. We discussed various treatment options to include no treatment, multiple extractions with alveoloplasty, pre-prosthetic surgery as indicated, periodontal therapy, dental restorations, root canal therapy, crown and bridge therapy, implant therapy, and replacement of missing teeth as indicated. The patient currently wishes to proceed with extraction of tooth numbers 5 and 14 with alveoloplasty and gross debridement of remaining dentition in the operating room with general anesthesia. This dental surgery will be scheduled after patient sees Dr. Prescott Gum early next week.  The patient will then need to have the Eliquis therapy discontinued 2-3 days prior to the invasive dental procedures in the operating room. The patient will also need to follow-up with his primary dentist, Dr. Laurelyn Sickle, for dental restoration on tooth #11. The patient will require antibiotic premedication prior to invasive dental procedures after placement of the LVAD.   2. Discussion of findings with medical team and coordination of future medical and dental care as needed.  I spent in excess of  120 minutes during the conduct of this consultation and >50% of this time involved direct face-to-face encounter for counseling and/or coordination of the patient's care.    [name], DDS

## 2017-08-30 NOTE — Patient Instructions (Signed)
[facility]    Department of Dental Medicine     DR. Delaney Schnick      THE HEART AND MOUTH CARE:  FACTS:   If you have any infection in your mouth, it can infect your heart.  If you heart is infected, you will be seriously ill.  Infections in the mouth can be SILENT and do not always cause pain.  Examples of infections in the mouth are gum disease, dental cavities, and abscesses.  Some possible signs of infection are: Bad breath, bleeding gums, or teeth that are sensitive to sweets, hot, and/or cold. There are many other signs as well.  WHAT YOU HAVE TO DO:   Brush your teeth after meals and at bedtime. Spend at least 2 minutes brushing well, especially behind your back teeth and all around your teeth that stand alone. Brush at the gumline also.  Do not go to bed without brushing your teeth and flossing.  If you gums bleed when you brush or floss, do NOT stop brushing or flossing. It usually means that your gums need more attention and better cleaning.   If your Dentist or Dr. Enrique Sack gave you a prescription mouthwash to use, make sure to use it as directed. If you run out of the medication, get a refill at the pharmacy.   If you were given any other medications or directions by your Dentist, please follow them. If you did not understand the directions or forget what you were told, please call. We will be happy to refresh her memory.  If you need antibiotics before dental procedures, make sure you take them one hour prior to every dental visit as directed.   Get a dental checkup every 4-6 months in order to keep your mouth healthy, or to find and treat any new infection. You will most likely need your teeth cleaned or gums treated at the same time.  If you are not able to come in for your scheduled appointment, call your Dentist as soon as possible to reschedule.  If you have a problem in between dental visits, call your Dentist.

## 2017-08-30 NOTE — Progress Notes (Signed)
LVAD Initial Psychosocial Screening  Date/Time Initiated:  08/28/17 10:50am Referral Source:  LVAD Coordinator Referral Reason:  LVAD implantation Source of Information:  Pt and chart review  Demographics Name:  Vincent Chandler Address:  [address] [place] [address] Home phone:  n/a   Cell: [phone] Marital Status:  Divorced  Faith:  [employer] Primary Language:  English SS: [id number]    DOB:  Nov 17, 1960  Medical & Follow-up Adherence to Medical regimen/INR checks: compliant  Medication adherence: compliant  Physician/Clinic Appointment Attendance: compliant   Advance Directives: Do you have a Living Will or Medical POA? No  Would you like to complete a Living Will and Medical POA prior to surgery?  Yes Do you have Goals of Care? No CSW discussed GOC and patient will meet with Palliative to discuss further.  Have you had a consult with the Palliative Care Team at [facility]? No  Psychological Health Appearance:  Unremarkable Mental Status:  Alert, oriented Eye Contact:  Good Thought Content:  Coherent Speech:  Logical/coherent Mood:  Appropriate  Affect:  Appropriate to circumstance Insight:  Good Judgement: Unimpaired Interaction Style:  Engaged  Family/Social Information Who lives in your home? Name:   Relationship:   self  Other family members/support persons in your life? Name:   Relationship:   Thelma Barge  Sister Edwinna Areola Brother Sherri [place] Needs Who is the primary caregiver? [place] status:  good Do you drive?  yes Do you work?  no Physical Limitations:  none Do you have other care giving responsibilities?  no Contact number: [phone]  Who is the secondary caregiver? [place] status:  good Do you drive?  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  no  Home  Environment/Personal Care Do you have reliable phone service? Yes  If so, what is the number?  [phone] Do you own or rent your home? own Number of steps into the home? 8 steps How many levels in the home? Second floor condo Assistive devices in the home? none Electrical needs for LVAD (3 prong outlets)? yes Second hand smoke exposure in the home? no Travel distance from [employer]? 5 miles   [employer] you active with community agencies/resources/homecare? No Agency Name: none Are you active in a church, synagogue, mosque or other faith based community? No Faith based institutions name: none What other sources do you have for spiritual support? none Are you active in any clubs or social organizations? none What do you do for fun?  Hobbies?  Interests? Golf, fish  Education/Work Information What is the last grade of school you completed? 12th Preferred method of learning?  Hands on Do you have any problems with reading or writing?  No Are you currently employed?  No  When were you last employed? 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 Many years If you are not working, do you plan to return to work after VAD surgery? No Are you interested in job training or learning new skills?  No Did you serve in the [place]? No  If so, what branch? Other  Financial Information What is your source of income? Disability $1400 Do you have difficulty meeting your monthly expenses? No If yes, which ones? How do you cope  with this? none Can you budget for the monthly cost for dressing supplies post procedure? Yes  Primary Health insurance:  Medicare Secondary Insurance: Prescription plan: Medicare D What are your prescription co-pays? varies Do you use mail order for your prescriptions?  No Have you ever had to refuse medication due to cost?  Yes but worked out a  solution Have you applied for Medicaid?  possibly Have you applied for Social Security Disability (SSI)  receives  Medical Information Briefly describe why you are here for evaluation: Patient reports he had a stent placed in 2016 and became SOB 2 months ago.  Do you have a PCP or other medical provider? Roma Schanz R, DO Are you able to complete your ADL's? Yes  Do you have a history of trauma, physical, emotional, or sexual abuse? no Do you have any family history of heart problems? Mother and father Do you smoke now or past usage? never    Do you drink alcohol now or past usage? past usage    Last drink 07/2017  Are you currently using illegal drugs or misuse of medication or past usage? never  Have you ever been treated for substance abuse? No   Mental Health History How have you been feeling in the past year? "not that good" Have you ever had any problems with depression, anxiety or other mental health issues? "problems with depression" Do you see a counselor, psychiatrist or therapist?   If you are currently experiencing problems are you interested in talking with a professional? No Have you or are you taking medications for anxiety/depression or any mental health concerns?  No  Current Medications: What are your coping strategies under stressful situations? Find a good movie Are there any other stressors in your life? none Have you had any past or current thoughts of suicide? none How many hours do you sleep at night? 6-8 hours How is your appetite? good Would you be interested in attending the LVAD support group? yes  PHQ2 Depression Scale: 0  Legal Do you currently have any legal issues/problems?  none Have you had any legal issues/problems in the past?  none Do you have a Durable POA?  no   Plan for VAD Implementation Do you know and understand what happens during the VAD surgery? Patient Verbalizes Understanding  of surgery and able to describe details What  do you know about the risks and side effect associated with VAD surgery? Patient Verbalizes Understanding  of risks (infection, stroke and death) Explain what will happen right after surgery: Patient Verbalizes Understanding  of OR to ICU and will be intubated What is your plan for transportation for the first 8 weeks post-surgery? (Patients are not recommended to drive post-surgery for 8 weeks)  Driver:    Sandi Carne Do you have airbags in your vehicle?  There is a risk of discharging the device if the airbag were to deploy. What do you know about your diet post-surgery? Patient Verbalizes Understanding  of Heart healthy How do you plan to monitor your medications, current and future?  Pill box  How do you plan to complete ADL's post-surgery?  Ask for help Will it be difficult to ask for help from your caregivers?  No, Patient states he will ask his sister  Please explain what you hope will be improved about your life as a result of receiving the LVAD? "just be able to get around" Please tell me your biggest concern or fear about living with the LVAD?  Not much fear How do you cope with your concerns and fears?  "watch a movie and get my mind off of it" Please explain your understanding of how their body will change?  In the beginning it will hurt but will buy clothe to cover it. Are you worried about these changes? No Do you see any barriers to your surgery or follow-up? None  Understanding of LVAD Patient states understanding of the following: Surgical procedures and risks, Electrical need for LVAD (3 prong outlets), Safety precautions with LVAD (water, etc.), LVAD daily self-care (dressing changes, computer check, extra supplies), Outpatient follow up (LVAD clinic appts, monitoring blood thinners) and Need for Emergency Planning  Discussed and Reviewed with Patient and Caregiver  Patient's current level of motivation to prepare for LVAD: motivated for improved health but hopeful to not  need LVAD. Patient's present Level of Consent for LVAD:  Patient is trying hard to "live right and do the right thing so I don't need it"     Education provided to patient:   Caregiver role and responsibiltiy, Financial planning for LVAD, Role of Clinical Social Worker and Signs of Depression and Anxiety.    Caregiver not available at time of assessment. CSW will contact via phone to discuss caregiver role.   Clinical Interventions Needed:    Patient hopeful to not have to pursue LVAD and motivated for improved health. CSW will monitor signs and symptoms of depression and assist with adjustment to life with an LVAD. CSW will refer patient for Living Will, HPOA, if not completed prior to surgery if still wishing to complete. CSW encouraged attendance with the LVAD Support Group to assist further with adjustment and post implant peer support.  Clinical Impressions/Recommendations:   Patient is a 57yo male who is single and has a supportive sister who lives in [place]. Patient lives alone in a second floor condo unit. 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 Patient reports he completed the 12th grade and denies any concerns with reading or writing. Patient enjoys golf and fishing in his spare time. He currently receives $1400 monthly SSD and has [employer] and recently obtained Medicare D prescription drug plan as he had not signed up in the past. Patient denies any trauma or physical/emotional abuse. He never used tobacco or substance use but admitted to weekend use of alcohol. Patient denies any mental health history although admits to some situational depression but no medications needed. He likes to "find a good movie" to cope with stressors. Patient scored a 0 on the PHQ-2. Patient reports he hopes his life will be improved by "just being able to get around". He states concerns about body image but feels he may be able to buy clothes to help with the adjustment. Patient  somewhat reluctant to pursue VAD at this time as he is hopeful for improved health. CSW will monitor and meet with patient again as potential VAD nears. Please contact CSW as needed for support and ongoing conversations related to VAD implant. Raquel Sarna, LCSW, [place]     Louann Liv, LCSW

## 2017-09-03 ENCOUNTER — Ambulatory Visit (HOSPITAL_COMMUNITY)
Admission: RE | Admit: 2017-09-03 | Discharge: 2017-09-03 | Disposition: A | Discharge status: Home / Self Care | Payer: Medicare Other | Source: Ambulatory Visit | Attending: Cardiology | Admitting: Cardiology

## 2017-09-03 DIAGNOSIS — I5022 Chronic systolic (congestive) heart failure: Secondary | ICD-10-CM

## 2017-09-03 LAB — BASIC METABOLIC PANEL
Anion gap: 8 (ref 5–15)
BUN: 38 mg/dL — AB (ref 6–20)
CALCIUM: 9.1 mg/dL (ref 8.9–10.3)
CHLORIDE: 110 mmol/L (ref 101–111)
CO2: 19 mmol/L — AB (ref 22–32)
CREATININE: 1.82 mg/dL — AB (ref 0.61–1.24)
GFR calc Af Amer: 46 mL/min — ABNORMAL LOW (ref >60)
GFR calc non Af Amer: 40 mL/min — ABNORMAL LOW (ref >60)
Glucose, Bld: 111 mg/dL — ABNORMAL HIGH (ref 65–99)
Potassium: 5 mmol/L (ref 3.5–5.1)
Sodium: 137 mmol/L (ref 135–145)

## 2017-09-04 ENCOUNTER — Other Ambulatory Visit (HOSPITAL_COMMUNITY): Payer: Self-pay | Admitting: *Deleted

## 2017-09-04 ENCOUNTER — Ambulatory Visit (INDEPENDENT_AMBULATORY_CARE_PROVIDER_SITE_OTHER): Payer: Medicare Other | Admitting: Physician Assistant

## 2017-09-04 ENCOUNTER — Encounter: Payer: Self-pay | Admitting: Physician Assistant

## 2017-09-04 ENCOUNTER — Encounter: Payer: Medicare Other | Admitting: Cardiothoracic Surgery

## 2017-09-04 VITALS — BP 100/58 | HR 72 | Ht 70.0 in | Wt 200.0 lb

## 2017-09-04 DIAGNOSIS — Z7901 Long term (current) use of anticoagulants: Secondary | ICD-10-CM

## 2017-09-04 DIAGNOSIS — Z1211 Encounter for screening for malignant neoplasm of colon: Secondary | ICD-10-CM

## 2017-09-04 DIAGNOSIS — I509 Heart failure, unspecified: Secondary | ICD-10-CM

## 2017-09-04 DIAGNOSIS — I255 Ischemic cardiomyopathy: Secondary | ICD-10-CM

## 2017-09-04 NOTE — Progress Notes (Signed)
Subjective:    Patient ID: Vincent Chandler, male    DOB: [birth year], 57 y.o.   MRN: [id number]  HPI Vincent Chandler is a 57 year old white male, self-referred, to discuss screening colonoscopy. He has no current GI complaints, and no family history of colon cancer or polyps that he is aware of. Patient had recent hospitalization in February 2019 at which time he was diagnosed with severe congestive heart failure and ischemic cardiomyopathy. He had an ICD placed. Echo done in February 2019 with EF of 15%. He says he feels much better since hospitalization and significant diuresis. He is undergoing preparation for possible LVAD but has not been told that he will definitely need this. He does have history of atrial flutter, and is to have an ablation done on 09/12/2017. He is being followed by Dr. Loralie Champagne. Other medical problems include coronary artery disease status post RCA stent, status post MI, he is status post CABG in January 2016. He is maintained on chronic eliquis, also with chronic kidney disease stage III, obstructive sleep apnea with no oxygen use, adult-onset diabetes mellitus, and history of V. Tach.    Review of Systems;Pertinent positive and negative review of systems were noted in the above HPI section.  All other review of systems was otherwise negative.  Outpatient Encounter Medications as of 09/04/2017  Medication Sig  . amiodarone (PACERONE) 200 MG tablet Take 1 tablet (200 mg total) by mouth daily. (Patient taking differently: Take 200 mg by mouth 2 (two) times daily. )  . apixaban (ELIQUIS) 5 MG TABS tablet Take 1 tablet (5 mg total) by mouth 2 (two) times daily.  Marland Kitchen atorvastatin (LIPITOR) 40 MG tablet Take 1 tablet (40 mg total) by mouth at bedtime.  . carvedilol (COREG) 3.125 MG tablet Take 1 tablet (3.125 mg total) by mouth 2 (two) times daily with a meal.  . digoxin (LANOXIN) 0.125 MG tablet Take 1 tablet (0.125 mg total) by mouth daily.  Marland Kitchen guaiFENesin (MUCINEX) 600 MG 12 hr  tablet Take 1 tablet (600 mg total) by mouth 2 (two) times daily as needed for to loosen phlegm.  . metFORMIN (GLUCOPHAGE) 500 MG tablet Take 1 tablet (500 mg total) by mouth 2 (two) times daily with a meal.  . mometasone-formoterol (DULERA) 200-5 MCG/ACT AERO Inhale 2 puffs into the lungs 2 (two) times daily. Further refills per PCP  . nitroGLYCERIN (NITROSTAT) 0.4 MG SL tablet Place 1 tablet (0.4 mg total) under the tongue every 5 (five) minutes as needed for chest pain.  . sacubitril-valsartan (ENTRESTO) 24-26 MG Take 1 tablet by mouth 2 (two) times daily.  Marland Kitchen spironolactone (ALDACTONE) 25 MG tablet Take 0.5 tablets (12.5 mg total) by mouth daily.  . [DISCONTINUED] empagliflozin (JARDIANCE) 10 MG TABS tablet Take 10 mg by mouth daily. (Patient not taking: Reported on 08/30/2017)   No facility-administered encounter medications on file as of 09/04/2017.    No Known Allergies Patient Active Problem List   Diagnosis Date Noted  . Retained dental root 08/30/2017  . Dental caries 08/30/2017  . Chronic periodontitis 08/30/2017  . Atrial flutter ([place]) 08/08/2017  . Dyspnea 08/07/2017  . Chronic systolic heart failure ([place]) 08/07/2017  . Hyperglycemia 08/07/2017  . Lower respiratory infection 08/07/2017  . Screening for colon cancer 08/07/2017  . CKD (chronic kidney disease) 2-3 07/19/2014  . NSTEMI (non-ST elevated myocardial infarction) ([place]) 07/18/2014  . OSA (obstructive sleep apnea) 07/18/2014  . Tachycardia   . V tach ([place]) 07/17/2014  . Hypoxemia 07/17/2014  .  CHF (congestive heart failure) ([place]) 06/04/2012  . Sinus tachycardia 12/29/2010  . Coronary artery disease prior RCA stent with new inferior Q waves 10/18/2010  . Ischemic and nonischemic cardiomyopathy   10/18/2010  . Uncontrolled type 2 diabetes mellitus without complication, without long-term current use of insulin ([place]) 03/10/2010  . Hyperlipidemia 03/10/2010  . Essential hypertension 03/10/2010   Social History    Socioeconomic History  . Marital status: Divorced    Spouse name: n/a  . Number of children: 0  . Years of education: 12th grade  . Highest education level: Not on file  Social Needs  . Financial resource strain: Not on file  . Food insecurity - worry: Not on file  . Food insecurity - inability: Not on file  . Transportation needs - medical: Not on file  . Transportation needs - non-medical: Not on file  Occupational History  . Occupation: Data processing manager ---self employed    Employer: [employer]  Tobacco Use  . Smoking status: Never Smoker  . Smokeless tobacco: Current User    Types: Chew  Substance and Sexual Activity  . Alcohol use: Yes    Alcohol/week: 3.6 oz    Types: 6 Shots of liquor per week  . Drug use: No  . Sexual activity: Yes    Partners: Female  Other Topics Concern  . Not on file  Social History Narrative   Exercise-- walking    Lives alone.   Brother lives in [place], [place]    Mr. Stick family history includes Diabetes in his father and mother; Hyperlipidemia in his father; Hypertension in his father and mother.      Objective:    Vitals:   09/04/17 0941  BP: (!) 100/58  Pulse: 72    Physical Exam; well-developed white male in no acute distress, pleasant blood pressure 100/58, pulse 72, BMI 28.7, height 5 foot 10, weight 200. HEENT; nontraumatic normocephalic EOMI PERRLA sclera anicteric, Cardiovascular ;regular rate and rhythm, soft systolic murmur. Pulmonary; clear bilaterally, Abdomen ;soft, nontender nondistended bowel sounds are active there is no palpable mass or hepatosplenomegaly Rectal; exam not done, Extremities; no clubbing cyanosis or edema skin warm and dry, Neuropsych ;mood and affect appropriate       Assessment & Plan:   #33 57 year old white male here for colon cancer screening. He is asymptomatic and of average risk for colon cancer. No prior colonoscopy #2 severe congestive heart failure, new  diagnosis of severe ischemic cardiomyopathy with EF of 15%. He has had ICD placed, and workup has been initiated for possible LVAD #3 atrial flutter-scheduled for ablation 09/12/2017 #4 coronary artery disease status post previous MI, status post CABG and stents #5 chronic anticoagulation-on eliquis #6 hypertension #7 chronic kidney disease stage III #8 obstructive sleep apnea-no oxygen used #9 adult-onset diabetes mellitus  Plan; Patient is not an appropriate candidate for screening colonoscopy currently with severe ischemic cardiomyopathy and EF of 15%. I am not aware that colonoscopy is required prior to LVAD placement, we will communicate with his cardiologist Dr Marigene Ehlers. If colonoscopy is needed prior to LVAD, and risks of sedation etc. for colonoscopy are acceptable to cardiology then procedure could be scheduled at the hospital/[facility] with Dr. Loletha Carrow. This patient is scheduled for ablation for atrial flutter in a couple of weeks, colonoscopy would need to be scheduled at a later date when it could also be appropriate for him to hold eliquis for short period of time.  Greater than 50% of office visit was  spent in review of patient's complicated history, and counseling/coordination of care.   Duong Haydel S Trease [place] PA-C 09/04/2017   Cc: Ann Held, *

## 2017-09-04 NOTE — Patient Instructions (Signed)
We will be in touch after speaking with Dr. Larey Dresser team.    If you are age 56 or younger, your body mass index should be between 19-25. Your Body mass index is 28.7 kg/m. If this is out of the aformentioned range listed, please consider follow up with your Primary Care Provider.

## 2017-09-04 NOTE — Progress Notes (Signed)
Thank you for sending this case to me. I have reviewed the entire note, and the outlined plan seems appropriate.  I agree that he is not appropriate for colon cancer screening at this time with the severity of his cardiac condition.  I am not aware the LVAD workup requires colonoscopy, and cannot see why it should.  Reasonable to check with cardiology about that.  Wilfrid Lund, MD

## 2017-09-06 ENCOUNTER — Encounter: Payer: Self-pay | Admitting: Family Medicine

## 2017-09-06 ENCOUNTER — Other Ambulatory Visit (HOSPITAL_COMMUNITY): Payer: Self-pay

## 2017-09-06 ENCOUNTER — Ambulatory Visit (INDEPENDENT_AMBULATORY_CARE_PROVIDER_SITE_OTHER): Payer: Medicare Other | Admitting: Family Medicine

## 2017-09-06 VITALS — BP 128/62 | HR 65 | Temp 98.1°F | Resp 16 | Ht 70.08 in | Wt 201.0 lb

## 2017-09-06 DIAGNOSIS — IMO0001 Reserved for inherently not codable concepts without codable children: Secondary | ICD-10-CM

## 2017-09-06 DIAGNOSIS — I5022 Chronic systolic (congestive) heart failure: Secondary | ICD-10-CM

## 2017-09-06 DIAGNOSIS — E1165 Type 2 diabetes mellitus with hyperglycemia: Secondary | ICD-10-CM | POA: Diagnosis not present

## 2017-09-06 DIAGNOSIS — E785 Hyperlipidemia, unspecified: Secondary | ICD-10-CM | POA: Diagnosis not present

## 2017-09-06 DIAGNOSIS — I255 Ischemic cardiomyopathy: Secondary | ICD-10-CM | POA: Diagnosis not present

## 2017-09-06 DIAGNOSIS — IMO0002 Reserved for concepts with insufficient information to code with codable children: Secondary | ICD-10-CM

## 2017-09-06 DIAGNOSIS — E1151 Type 2 diabetes mellitus with diabetic peripheral angiopathy without gangrene: Secondary | ICD-10-CM

## 2017-09-06 MED ORDER — GLUCOSE BLOOD VI STRP: ORAL_STRIP | 1 refills | Status: DC

## 2017-09-06 MED ORDER — LANCETS MISC: 1 refills | Status: DC

## 2017-09-06 MED ORDER — BLOOD GLUCOSE METER KIT: PACK | 0 refills | Status: AC

## 2017-09-06 NOTE — Progress Notes (Signed)
Paramedicine Encounter    Patient ID: Vincent Chandler, male    DOB: 1961-01-15, 57 y.o.   MRN: [id number]   Patient Care Team: [name], Alferd Apa, DO as PCP - General (Family Medicine) Burtis Junes, NP as Nurse Practitioner (Nurse Practitioner) Deboraha Sprang, MD as Consulting Physician (Cardiology) Allyn Kenner, MD (Dermatology)  Patient Active Problem List   Diagnosis Date Noted  . Retained dental root 08/30/2017  . Dental caries 08/30/2017  . Chronic periodontitis 08/30/2017  . Atrial flutter ([place]) 08/08/2017  . Dyspnea 08/07/2017  . Chronic systolic heart failure ([place]) 08/07/2017  . Hyperglycemia 08/07/2017  . Lower respiratory infection 08/07/2017  . Screening for colon cancer 08/07/2017  . CKD (chronic kidney disease) 2-3 07/19/2014  . NSTEMI (non-ST elevated myocardial infarction) ([place]) 07/18/2014  . OSA (obstructive sleep apnea) 07/18/2014  . Tachycardia   . V tach ([place]) 07/17/2014  . Hypoxemia 07/17/2014  . CHF (congestive heart failure) ([place]) 06/04/2012  . Sinus tachycardia 12/29/2010  . Coronary artery disease prior RCA stent with new inferior Q waves 10/18/2010  . Ischemic and nonischemic cardiomyopathy   10/18/2010  . Uncontrolled type 2 diabetes mellitus without complication, without long-term current use of insulin ([place]) 03/10/2010  . Hyperlipidemia 03/10/2010  . Essential hypertension 03/10/2010    Current Outpatient Medications:  .  amiodarone (PACERONE) 200 MG tablet, Take 1 tablet (200 mg total) by mouth daily. (Patient taking differently: Take 200 mg by mouth 2 (two) times daily. ), Disp: 30 tablet, Rfl: 3 .  apixaban (ELIQUIS) 5 MG TABS tablet, Take 1 tablet (5 mg total) by mouth 2 (two) times daily., Disp: 180 tablet, Rfl: 3 .  atorvastatin (LIPITOR) 40 MG tablet, Take 1 tablet (40 mg total) by mouth at bedtime., Disp: , Rfl:  .  blood glucose meter kit and supplies, Dispense based on patient and insurance preference. Use as directed once a day. Dx  Code E11.9., Disp: 1 each, Rfl: 0 .  carvedilol (COREG) 3.125 MG tablet, Take 1 tablet (3.125 mg total) by mouth 2 (two) times daily with a meal., Disp: , Rfl:  .  digoxin (LANOXIN) 0.125 MG tablet, Take 1 tablet (0.125 mg total) by mouth daily., Disp: 30 tablet, Rfl: 3 .  glucose blood test strip, Use as directed once a day.  E11.9, Disp: 100 each, Rfl: 1 .  Lancets MISC, Use as directed once a day.  E11.9, Disp: 100 each, Rfl: 1 .  metFORMIN (GLUCOPHAGE) 500 MG tablet, Take 1 tablet (500 mg total) by mouth 2 (two) times daily with a meal., Disp: 60 tablet, Rfl: 3 .  mometasone-formoterol (DULERA) 200-5 MCG/ACT AERO, Inhale 2 puffs into the lungs 2 (two) times daily. Further refills per PCP, Disp: 1 Inhaler, Rfl: 0 .  sacubitril-valsartan (ENTRESTO) 24-26 MG, Take 1 tablet by mouth 2 (two) times daily., Disp: , Rfl:  .  spironolactone (ALDACTONE) 25 MG tablet, Take 0.5 tablets (12.5 mg total) by mouth daily., Disp: 15 tablet, Rfl: 3 .  guaiFENesin (MUCINEX) 600 MG 12 hr tablet, Take 1 tablet (600 mg total) by mouth 2 (two) times daily as needed for to loosen phlegm. (Patient not taking: Reported on 09/06/2017), Disp: , Rfl:  .  nitroGLYCERIN (NITROSTAT) 0.4 MG SL tablet, Place 1 tablet (0.4 mg total) under the tongue every 5 (five) minutes as needed for chest pain. (Patient not taking: Reported on 09/06/2017), Disp: 25 tablet, Rfl: 3 No Known Allergies    Social History   Socioeconomic History  .  Marital status: Divorced    Spouse name: n/a  . Number of children: 0  . Years of education: 12th grade  . Highest education level: Not on file  Social Needs  . Financial resource strain: Not on file  . Food insecurity - worry: Not on file  . Food insecurity - inability: Not on file  . Transportation needs - medical: Not on file  . Transportation needs - non-medical: Not on file  Occupational History  . Occupation: Data processing manager ---self employed    Employer: [employer]  Tobacco Use  . Smoking status: Never Smoker  . Smokeless tobacco: Current User    Types: Chew  Substance and Sexual Activity  . Alcohol use: Yes    Alcohol/week: 3.6 oz    Types: 6 Shots of liquor per week  . Drug use: No  . Sexual activity: Yes    Partners: Female  Other Topics Concern  . Not on file  Social History Narrative   Exercise-- walking    Lives alone.   Brother lives in [place], [place]    Physical Exam      Future Appointments  Date Time Provider [place]  09/07/2017 11:45 AM [facility]  09/10/2017  9:40 AM Larey Dresser, MD [facility] None  09/10/2017 10:30 AM Ivin Poot, MD [facility]  09/19/2017  9:05 AM [facility] DEVICE REMOTES [facility]  10/25/2017  1:45 PM Deboraha Sprang, MD [facility]  11/08/2017 11:15 AM Lowne Koren Shiver, DO [facility]    BP 108/60   Pulse 60   Resp 15   Wt 200 lb (90.7 kg)   SpO2 97%   BMI 28.63 kg/m   Weight yesterday-200  CG PTA-119   Pt reports he is doing good- He went to see diabetic doc today and was prescribed another glucometer. States he is following low sugar and low sodium diet. He has been taking his amio BID and I confirmed with susie he needs to cut it back to one tab daily. Called pharmacy to get it refilled. Not had to use the NTG.  Pt denies any sob, no dizziness.  meds verified and he is able to tell me how he takes the other meds correctly. He has his ablation next week.     Marylouise Stacks, [facility] Paramedic  09/06/17
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
## 2017-09-06 NOTE — Patient Instructions (Addendum)
Check out  Goodrx.com for your medications  You can also go online to the different drug companies to see if you qualify for free medication.       Carbohydrate Counting for Diabetes Mellitus, Adult Carbohydrate counting is a method for keeping track of how many carbohydrates you eat. Eating carbohydrates naturally increases the amount of sugar (glucose) in the blood. Counting how many carbohydrates you eat helps keep your blood glucose within normal limits, which helps you manage your diabetes (diabetes mellitus). It is important to know how many carbohydrates you can safely have in each meal. This is different for every person. A diet and nutrition specialist (registered dietitian) can help you make a meal plan and calculate how many carbohydrates you should have at each meal and snack. Carbohydrates are found in the following foods:  Grains, such as breads and cereals.  Dried beans and soy products.  Starchy vegetables, such as potatoes, peas, and corn.  Fruit and fruit juices.  Milk and yogurt.  Sweets and snack foods, such as cake, cookies, candy, chips, and soft drinks.  How do I count carbohydrates? There are two ways to count carbohydrates in food. You can use either of the methods or a combination of both. Reading "Nutrition Facts" on packaged food The "Nutrition Facts" list is included on the labels of almost all packaged foods and beverages in the [place] It includes:  The serving size.  Information about nutrients in each serving, including the grams (g) of carbohydrate per serving.  To use the "Nutrition Facts":  Decide how many servings you will have.  Multiply the number of servings by the number of carbohydrates per serving.  The resulting number is the total amount of carbohydrates that you will be having.  Learning standard serving sizes of other foods When you eat foods containing carbohydrates that are not packaged or do not include "Nutrition Facts" on the  label, you need to measure the servings in order to count the amount of carbohydrates:  Measure the foods that you will eat with a food scale or measuring cup, if needed.  Decide how many standard-size servings you will eat.  Multiply the number of servings by 15. Most carbohydrate-rich foods have about 15 g of carbohydrates per serving. ? For example, if you eat 8 oz (170 g) of strawberries, you will have eaten 2 servings and 30 g of carbohydrates (2 servings x 15 g = 30 g).  For foods that have more than one food mixed, such as soups and casseroles, you must count the carbohydrates in each food that is included.  The following list contains standard serving sizes of common carbohydrate-rich foods. Each of these servings has about 15 g of carbohydrates:   hamburger bun or  English muffin.   oz (15 mL) syrup.   oz (14 g) jelly.  1 slice of bread.  1 six-inch tortilla.  3 oz (85 g) cooked rice or pasta.  4 oz (113 g) cooked dried beans.  4 oz (113 g) starchy vegetable, such as peas, corn, or potatoes.  4 oz (113 g) hot cereal.  4 oz (113 g) mashed potatoes or  of a large baked potato.  4 oz (113 g) canned or frozen fruit.  4 oz (120 mL) fruit juice.  4-6 crackers.  6 chicken nuggets.  6 oz (170 g) unsweetened dry cereal.  6 oz (170 g) plain fat-free yogurt or yogurt sweetened with artificial sweeteners.  8 oz (240 mL) milk.  8  oz (170 g) fresh fruit or one small piece of fruit.  24 oz (680 g) popped popcorn.  Example of carbohydrate counting Sample meal  3 oz (85 g) chicken breast.  6 oz (170 g) brown rice.  4 oz (113 g) corn.  8 oz (240 mL) milk.  8 oz (170 g) strawberries with sugar-free whipped topping. Carbohydrate calculation 1. Identify the foods that contain carbohydrates: ? Rice. ? Corn. ? Milk. ? Strawberries. 2. Calculate how many servings you have of each food: ? 2 servings rice. ? 1 serving corn. ? 1 serving milk. ? 1 serving  strawberries. 3. Multiply each number of servings by 15 g: ? 2 servings rice x 15 g = 30 g. ? 1 serving corn x 15 g = 15 g. ? 1 serving milk x 15 g = 15 g. ? 1 serving strawberries x 15 g = 15 g. 4. Add together all of the amounts to find the total grams of carbohydrates eaten: ? 30 g + 15 g + 15 g + 15 g = 75 g of carbohydrates total. This information is not intended to replace advice given to you by your health care provider. Make sure you discuss any questions you have with your health care provider. Document Released: 06/19/2005 Document Revised: 01/07/2016 Document Reviewed: 12/01/2015 Elsevier Interactive Patient Education  [employer].

## 2017-09-06 NOTE — Assessment & Plan Note (Signed)
Per cardiology 

## 2017-09-06 NOTE — Assessment & Plan Note (Signed)
Tolerating statin, encouraged heart healthy diet, avoid trans fats, minimize simple carbs and saturated fats. Increase exercise as tolerated Recheck labs in 2 months

## 2017-09-06 NOTE — Assessment & Plan Note (Addendum)
rx for new glucometer con't metformin con't with diet and exercise

## 2017-09-07 ENCOUNTER — Telehealth (HOSPITAL_COMMUNITY): Payer: Self-pay | Admitting: *Deleted

## 2017-09-07 ENCOUNTER — Telehealth (HOSPITAL_COMMUNITY): Payer: Self-pay | Admitting: Pharmacist

## 2017-09-07 ENCOUNTER — Encounter: Payer: Medicare Other | Admitting: Cardiothoracic Surgery

## 2017-09-07 ENCOUNTER — Other Ambulatory Visit: Payer: Medicare Other | Admitting: *Deleted

## 2017-09-07 DIAGNOSIS — Z01812 Encounter for preprocedural laboratory examination: Secondary | ICD-10-CM

## 2017-09-07 DIAGNOSIS — I4892 Unspecified atrial flutter: Secondary | ICD-10-CM | POA: Diagnosis not present

## 2017-09-07 LAB — BASIC METABOLIC PANEL
BUN/Creatinine Ratio: 16 (ref 9–20)
BUN: 25 mg/dL — ABNORMAL HIGH (ref 6–24)
CO2: 20 mmol/L (ref 20–29)
Calcium: 9 mg/dL (ref 8.7–10.2)
Chloride: 108 mmol/L — ABNORMAL HIGH (ref 96–106)
Creatinine, Ser: 1.53 mg/dL — ABNORMAL HIGH (ref 0.76–1.27)
GFR, EST AFRICAN AMERICAN: 58 mL/min/{1.73_m2} — AB (ref >59)
GFR, EST NON AFRICAN AMERICAN: 50 mL/min/{1.73_m2} — AB (ref >59)
Glucose: 216 mg/dL — ABNORMAL HIGH (ref 65–99)
Potassium: 4.9 mmol/L (ref 3.5–5.2)
SODIUM: 141 mmol/L (ref 134–144)

## 2017-09-07 LAB — CBC WITH DIFFERENTIAL/PLATELET
BASOS: 0 %
Basophils Absolute: 0 10*3/uL (ref 0.0–0.2)
EOS (ABSOLUTE): 0.2 10*3/uL (ref 0.0–0.4)
Eos: 4 %
HEMATOCRIT: 42.7 % (ref 37.5–51.0)
Hemoglobin: 14.3 g/dL (ref 13.0–17.7)
Immature Grans (Abs): 0 10*3/uL (ref 0.0–0.1)
Immature Granulocytes: 0 %
LYMPHS ABS: 0.8 10*3/uL (ref 0.7–3.1)
Lymphs: 19 %
MCH: 35.7 pg — ABNORMAL HIGH (ref 26.6–33.0)
MCHC: 33.5 g/dL (ref 31.5–35.7)
MCV: 107 fL — AB (ref 79–97)
Monocytes Absolute: 0.7 10*3/uL (ref 0.1–0.9)
Monocytes: 16 %
Neutrophils Absolute: 2.5 10*3/uL (ref 1.4–7.0)
Neutrophils: 61 %
PLATELETS: 167 10*3/uL (ref 150–379)
RBC: 4.01 x10E6/uL — AB (ref 4.14–5.80)
RDW: 15 % (ref 12.3–15.4)
WBC: 4.2 10*3/uL (ref 3.4–10.8)

## 2017-09-07 MED FILL — AMIODARONE HCL 200 MG TAB: 200 | 34 days supply | Qty: 34 | Fill #0

## 2017-09-07 NOTE — Telephone Encounter (Signed)
Pt left VM on triage line 3/7 at 4:30 stating he only had 3 tabs of Amio left then he will be out and he has no insurance to get more.  I called pt back this AM, he is still waiting on his Medicare part D to go into affect.  Refill for Amio sent [place] under HF FUND, he states he will p/u later today.

## 2017-09-07 NOTE — Telephone Encounter (Signed)
[employer] patient assistance approved for Praxair 24-26 mg BID through 07/02/18.   Ruta Hinds. Velva Harman, PharmD, BCPS, CPP Clinical Pharmacist Phone: [phone] 09/07/2017 10:01 AM

## 2017-09-10 ENCOUNTER — Institutional Professional Consult (permissible substitution) (INDEPENDENT_AMBULATORY_CARE_PROVIDER_SITE_OTHER): Payer: Medicare Other | Admitting: Cardiothoracic Surgery

## 2017-09-10 ENCOUNTER — Other Ambulatory Visit (HOSPITAL_COMMUNITY): Payer: Self-pay

## 2017-09-10 ENCOUNTER — Ambulatory Visit (HOSPITAL_COMMUNITY)
Admission: RE | Admit: 2017-09-10 | Discharge: 2017-09-10 | Disposition: A | Discharge status: Home / Self Care | Payer: Medicare Other | Source: Ambulatory Visit | Attending: Cardiology | Admitting: Cardiology

## 2017-09-10 ENCOUNTER — Encounter: Payer: Self-pay | Admitting: Cardiothoracic Surgery

## 2017-09-10 ENCOUNTER — Other Ambulatory Visit: Payer: Self-pay

## 2017-09-10 ENCOUNTER — Encounter (HOSPITAL_COMMUNITY): Payer: Self-pay | Admitting: Cardiology

## 2017-09-10 VITALS — BP 103/67 | HR 61 | Resp 18 | Ht 70.0 in | Wt 199.8 lb

## 2017-09-10 VITALS — BP 108/58 | HR 68 | Wt 200.0 lb

## 2017-09-10 DIAGNOSIS — E785 Hyperlipidemia, unspecified: Secondary | ICD-10-CM

## 2017-09-10 DIAGNOSIS — Z79899 Other long term (current) drug therapy: Secondary | ICD-10-CM | POA: Insufficient documentation

## 2017-09-10 DIAGNOSIS — G4733 Obstructive sleep apnea (adult) (pediatric): Secondary | ICD-10-CM | POA: Insufficient documentation

## 2017-09-10 DIAGNOSIS — E1122 Type 2 diabetes mellitus with diabetic chronic kidney disease: Secondary | ICD-10-CM | POA: Diagnosis not present

## 2017-09-10 DIAGNOSIS — N183 Chronic kidney disease, stage 3 unspecified: Secondary | ICD-10-CM

## 2017-09-10 DIAGNOSIS — I5022 Chronic systolic (congestive) heart failure: Secondary | ICD-10-CM | POA: Insufficient documentation

## 2017-09-10 DIAGNOSIS — J8410 Pulmonary fibrosis, unspecified: Secondary | ICD-10-CM | POA: Insufficient documentation

## 2017-09-10 DIAGNOSIS — I255 Ischemic cardiomyopathy: Secondary | ICD-10-CM | POA: Diagnosis not present

## 2017-09-10 DIAGNOSIS — I4892 Unspecified atrial flutter: Secondary | ICD-10-CM

## 2017-09-10 DIAGNOSIS — Z7984 Long term (current) use of oral hypoglycemic drugs: Secondary | ICD-10-CM | POA: Diagnosis not present

## 2017-09-10 DIAGNOSIS — J849 Interstitial pulmonary disease, unspecified: Secondary | ICD-10-CM | POA: Diagnosis not present

## 2017-09-10 DIAGNOSIS — Z951 Presence of aortocoronary bypass graft: Secondary | ICD-10-CM | POA: Insufficient documentation

## 2017-09-10 DIAGNOSIS — R7989 Other specified abnormal findings of blood chemistry: Secondary | ICD-10-CM | POA: Diagnosis not present

## 2017-09-10 DIAGNOSIS — I130 Hypertensive heart and chronic kidney disease with heart failure and stage 1 through stage 4 chronic kidney disease, or unspecified chronic kidney disease: Secondary | ICD-10-CM | POA: Insufficient documentation

## 2017-09-10 DIAGNOSIS — R0683 Snoring: Secondary | ICD-10-CM | POA: Diagnosis not present

## 2017-09-10 DIAGNOSIS — I2510 Atherosclerotic heart disease of native coronary artery without angina pectoris: Secondary | ICD-10-CM | POA: Insufficient documentation

## 2017-09-10 LAB — COMPREHENSIVE METABOLIC PANEL
ALK PHOS: 87 U/L (ref 38–126)
ALT: 44 U/L (ref 17–63)
ANION GAP: 11 (ref 5–15)
AST: 32 U/L (ref 15–41)
Albumin: 3.6 g/dL (ref 3.5–5.0)
BUN: 26 mg/dL — ABNORMAL HIGH (ref 6–20)
CO2: 19 mmol/L — ABNORMAL LOW (ref 22–32)
CREATININE: 1.48 mg/dL — AB (ref 0.61–1.24)
Calcium: 9 mg/dL (ref 8.9–10.3)
Chloride: 107 mmol/L (ref 101–111)
GFR, EST AFRICAN AMERICAN: 59 mL/min — AB (ref >60)
GFR, EST NON AFRICAN AMERICAN: 51 mL/min — AB (ref >60)
Glucose, Bld: 178 mg/dL — ABNORMAL HIGH (ref 65–99)
Potassium: 4.5 mmol/L (ref 3.5–5.1)
SODIUM: 137 mmol/L (ref 135–145)
Total Bilirubin: 0.9 mg/dL (ref 0.3–1.2)
Total Protein: 7.3 g/dL (ref 6.5–8.1)

## 2017-09-10 LAB — TSH: TSH: 4.56 u[IU]/mL — AB (ref 0.350–4.500)

## 2017-09-10 LAB — LIPID PANEL
CHOL/HDL RATIO: 3.9 ratio
Cholesterol: 109 mg/dL (ref 0–200)
HDL: 28 mg/dL — AB (ref >40)
LDL Cholesterol: 52 mg/dL (ref 0–99)
TRIGLYCERIDES: 147 mg/dL (ref <150)
VLDL: 29 mg/dL (ref 0–40)

## 2017-09-10 LAB — T4, FREE: FREE T4: 1.05 ng/dL (ref 0.61–1.12)

## 2017-09-10 LAB — DIGOXIN LEVEL: DIGOXIN LVL: 1.6 ng/mL (ref 0.8–2.0)

## 2017-09-10 MED ORDER — CARVEDILOL 6.25 MG PO TABS: 6.2500 mg | ORAL_TABLET | Freq: Two times a day (BID) | ORAL | 3 refills | Status: DC

## 2017-09-10 NOTE — H&P (View-Only) (Signed)
PCP: Dr. Etter Sjogren Primary HF Cardiologist: Dr [name]  EP: Dr Caryl Comes   HPI: Vincent Chandler is a 57 year old with history of A flutter, chronic systolic heart failure, CAD s/p CABG, CKD Stage III, DMII, OSA.   Admitted 08/07/17 with atrial flutter/RVR and acute on chronic systolic heart failure. Underwent successful DC/CV on 2/8. Required short term milrinone but was able to wean off. HF meds started. Echo this admission with EF 15%. D/C weight 201 pounds.  CPX in 2/19 showed severe functional impairment due to heart failure. However, PFTs were restrictive and high resolution CT chest was concerning for interstitial lung disease.   He saw Dr. Caryl Comes with plans for atrial flutter ablation later this week.   He returns today for followup of CHF.  He is doing very well symptomatically, somewhat surprising based on CPX as well as milrinone-dependent HF during last admission. No problems walking around on flat ground.  He gets fatigued walking up 1 flight of stairs. Chronic dry cough in the evening.  No orthopnea/PND.  No chest pain.  No lightheadedness. No palpitations, he is in NSR today. He is not smoking or drinking ETOH. He does not require a diuretic currently.   ECG (personally reviewed): NSR, LBBB 154 msec  Labs (2/19): free T4 increased but free T3 normal, LDL 86, HDL 30 Labs (3/19): K 4.9, creatinine 1.53, hgb 14.3  PMH:  1. Atrial fibrillation: Noted post-op CABG.  2. Atrial flutter: Noted during 2/19 admission, DCCV done.  3. CAD: s/p CABG.  - LHC (1/16):  Patent LIMA-LAD and SVG-D. 80% mLCx and 99% PLOM, total occlusion of SVG-PLOM.  Total occlusion of RCA with occlusion of SVG-RCA.  Patient had DES to mLCx-PLOM.  4. HTN 5. Type II diabetes  6. Hyperlipidemia 7. Chronic systolic CHF: Ischemic cardiomyopathy.   - Echo (2/19): EF 15%, mildly dilated LV, mild LVH, inferolateral akinesis, milr Vincent, mildly dilated RV with severely reduced systolic function.  - CPX (2/19): Peak VO2 12.2, VE/VCO2  slope 53, RER 1.07 => severe limitation from heart failure.  [employer] ICD  8. Interstitial lung disease: PFTs (2/19) were restrictive, raising concern for ILD.  - High resolution CT chest in 2/19: interstitial lung disease concerning for usual interstitial pneumonitis. 9. CKD stage 3 10. OSA 11. ABIs (2/19): not significantly abnormal 12. Carotid dopplers (2/19): Mild disease only.   ROS: All systems negative except as listed in HPI, PMH and Problem List.  Social History   Socioeconomic History  . Marital status: Divorced    Spouse name: n/a  . Number of children: 0  . Years of education: 12th grade  . Highest education level: Not on file  Social Needs  . Financial resource strain: Not on file  . Food insecurity - worry: Not on file  . Food insecurity - inability: Not on file  . Transportation needs - medical: Not on file  . Transportation needs - non-medical: Not on file  Occupational History  . Occupation: Data processing manager ---self employed    Employer: [employer]  Tobacco Use  . Smoking status: Never Smoker  . Smokeless tobacco: Current User    Types: Chew  Substance and Sexual Activity  . Alcohol use: Yes    Alcohol/week: 3.6 oz    Types: 6 Shots of liquor per week  . Drug use: No  . Sexual activity: Yes    Partners: Female  Other Topics Concern  . Not on file  Social History Narrative  Exercise-- walking    Lives alone.   Brother lives in [place], [place]    Family History  Problem Relation Age of Onset  . Hypertension Mother   . Diabetes Mother   . Hypertension Father   . Diabetes Father   . Hyperlipidemia Father     Current Outpatient Medications  Medication Sig Dispense Refill  . amiodarone (PACERONE) 200 MG tablet Take 200 mg by mouth daily.    Marland Kitchen apixaban (ELIQUIS) 5 MG TABS tablet Take 1 tablet (5 mg total) by mouth 2 (two) times daily. 180 tablet 3  . atorvastatin (LIPITOR) 40 MG tablet Take 1 tablet (40 mg  total) by mouth at bedtime.    . blood glucose meter kit and supplies Dispense based on patient and insurance preference. Use as directed once a day. Dx Code E11.9. 1 each 0  . carvedilol (COREG) 6.25 MG tablet Take 1 tablet (6.25 mg total) by mouth 2 (two) times daily with a meal. 60 tablet 3  . digoxin (LANOXIN) 0.125 MG tablet Take 1 tablet (0.125 mg total) by mouth daily. 30 tablet 3  . glucose blood test strip Use as directed once a day.  E11.9 100 each 1  . Lancets MISC Use as directed once a day.  E11.9 100 each 1  . metFORMIN (GLUCOPHAGE) 500 MG tablet Take 1 tablet (500 mg total) by mouth 2 (two) times daily with a meal. 60 tablet 3  . mometasone-formoterol (DULERA) 200-5 MCG/ACT AERO Inhale 2 puffs into the lungs 2 (two) times daily. Further refills per PCP 1 Inhaler 0  . sacubitril-valsartan (ENTRESTO) 49-51 MG Take 1 tablet by mouth 2 (two) times daily.    Marland Kitchen spironolactone (ALDACTONE) 25 MG tablet Take 0.5 tablets (12.5 mg total) by mouth daily. 15 tablet 3  . guaiFENesin (MUCINEX) 600 MG 12 hr tablet Take 1 tablet (600 mg total) by mouth 2 (two) times daily as needed for to loosen phlegm.    . nitroGLYCERIN (NITROSTAT) 0.4 MG SL tablet Place 1 tablet (0.4 mg total) under the tongue every 5 (five) minutes as needed for chest pain. 25 tablet 3   No current facility-administered medications for this encounter.     Vitals:   09/10/17 0946  BP: (!) 108/58  Pulse: 68  SpO2: 91%  Weight: 200 lb (90.7 kg)    PHYSICAL EXAM: General: NAD Neck: No JVD, no thyromegaly or thyroid nodule.  Lungs: Clear to auscultation bilaterally with normal respiratory effort. CV: Nondisplaced PMI.  Heart regular S1/S2, no S3/S4, no murmur.  No peripheral edema.  No carotid bruit.  Normal pedal pulses.  Abdomen: Soft, nontender, no hepatosplenomegaly, no distention.  Skin: Intact without lesions or rashes.  Neurologic: Alert and oriented x 3.  Psych: Normal affect. Extremities: No clubbing or  cyanosis.  HEENT: Normal.    ASSESSMENT & PLAN: 1. Chronic systolic CHF: Ischemic cardiomyopathy. [employer] ICD single chamber. 2/19 admission for decompensated HF requiring milrinone. ECHO 2/7/019 EF 15% Mildly dilated RV. CPX in 2/19 was suggestive of severe limitation due to HF.  Currently NYHA class II symptoms, seems to be doing better than would be predicted from CPX and recent admission.  He is not volume overloaded on exam and is not on a diuretic.  - Increase Coreg to 6.25 mg bid.  - Continue digoxin 0.125, check level today.  - Continue current Entresto and spironolactone. BMET today.  - Has LBBB with wide QRS, will discuss CRT upgrade with Dr. Caryl Comes.  - Based on  CPX and recent admission, we have been evaluating him for LVAD.  He has undergone most of the workup.  He will be seeing Dr. Prescott Gum today.  However, given recent finding of ILD on CT chest as well as good functional status subjectively, he is not ready for LVAD at this point. Will need to continue to monitor closely.  2. Atrial flutter: Paroxysmal.  Admitted in 2/19 with severe HF decompensation in setting of atrial flutter with RVR.  He was cardioverted and is now on amiodarone.  - Continue amiodarone 200 mg daily, need to check LFTs, TSH, free T3, free T4 today. He will need to have regular eye exams on amiodarone.  - Continue Eliquis 5 mg bid.  - Has seen Dr. Caryl Comes, plan for flutter ablation later this week.  This should allow eventual discontinuation of amiodarone.  3. Atrial fibrillation: Noted post-op CABG.  4. CAD: S/p CABG.  Last intervention was PCI to LCx in 2016.  - With recent decompensation, think he would benefit from RHC/LHC.  Will wait until about a month after atrial flutter ablation prior to holding anticoagulation for cath.  - Continue atorvastatin but need to check lipids now that he is taking it regularly. Increase if LDL > 70.  - No ASA with Eliquis use.  5. Pulmonary fibrosis: PFTs in 2/19 were  restrictive, and high resolution CT showed interstitial lung disease. He will need an appointment with pulmonary, will arrange.   6. OSA: Arrange appt with Dr. Radford Pax.  7. CKD: Stage 3. BMET today.   Followup in 1 month.   Vincent Chandler  09/10/2017

## 2017-09-10 NOTE — Patient Instructions (Signed)
Increase Carvedilol 6.25 mg (1 tab), twice a day  You have been referred to [name] for ILD  You have been referred to Dr. Radford Pax for CPAP  Labs drawn today (if we do not call you, then your lab work was stable)   Your physician recommends that you schedule a follow-up appointment in: 1 month with Dr. [name]

## 2017-09-10 NOTE — Progress Notes (Addendum)
PCP: Dr. Etter Sjogren Primary HF Cardiologist: Dr [name]  EP: Dr Caryl Comes   HPI: Vincent Chandler is a 57 year old with history of A flutter, chronic systolic heart failure, CAD s/p CABG, CKD Stage III, DMII, OSA.   Admitted 08/07/17 with atrial flutter/RVR and acute on chronic systolic heart failure. Underwent successful DC/CV on 2/8. Required short term milrinone but was able to wean off. HF meds started. Echo this admission with EF 15%. D/C weight 201 pounds.  CPX in 2/19 showed severe functional impairment due to heart failure. However, PFTs were restrictive and high resolution CT chest was concerning for interstitial lung disease.   He saw Dr. Caryl Comes with plans for atrial flutter ablation later this week.   He returns today for followup of CHF.  He is doing very well symptomatically, somewhat surprising based on CPX as well as milrinone-dependent HF during last admission. No problems walking around on flat ground.  He gets fatigued walking up 1 flight of stairs. Chronic dry cough in the evening.  No orthopnea/PND.  No chest pain.  No lightheadedness. No palpitations, he is in NSR today. He is not smoking or drinking ETOH. He does not require a diuretic currently.   ECG (personally reviewed): NSR, LBBB 154 msec  Labs (2/19): free T4 increased but free T3 normal, LDL 86, HDL 30 Labs (3/19): K 4.9, creatinine 1.53, hgb 14.3  PMH:  1. Atrial fibrillation: Noted post-op CABG.  2. Atrial flutter: Noted during 2/19 admission, DCCV done.  3. CAD: s/p CABG.  - LHC (1/16):  Patent LIMA-LAD and SVG-D. 80% mLCx and 99% PLOM, total occlusion of SVG-PLOM.  Total occlusion of RCA with occlusion of SVG-RCA.  Patient had DES to mLCx-PLOM.  4. HTN 5. Type II diabetes  6. Hyperlipidemia 7. Chronic systolic CHF: Ischemic cardiomyopathy.   - Echo (2/19): EF 15%, mildly dilated LV, mild LVH, inferolateral akinesis, milr Vincent, mildly dilated RV with severely reduced systolic function.  - CPX (2/19): Peak VO2 12.2, VE/VCO2  slope 53, RER 1.07 => severe limitation from heart failure.  [employer] ICD  8. Interstitial lung disease: PFTs (2/19) were restrictive, raising concern for ILD.  - High resolution CT chest in 2/19: interstitial lung disease concerning for usual interstitial pneumonitis. 9. CKD stage 3 10. OSA 11. ABIs (2/19): not significantly abnormal 12. Carotid dopplers (2/19): Mild disease only.   ROS: All systems negative except as listed in HPI, PMH and Problem List.  Social History   Socioeconomic History  . Marital status: Divorced    Spouse name: n/a  . Number of children: 0  . Years of education: 12th grade  . Highest education level: Not on file  Social Needs  . Financial resource strain: Not on file  . Food insecurity - worry: Not on file  . Food insecurity - inability: Not on file  . Transportation needs - medical: Not on file  . Transportation needs - non-medical: Not on file  Occupational History  . Occupation: Data processing manager ---self employed    Employer: [employer]  Tobacco Use  . Smoking status: Never Smoker  . Smokeless tobacco: Current User    Types: Chew  Substance and Sexual Activity  . Alcohol use: Yes    Alcohol/week: 3.6 oz    Types: 6 Shots of liquor per week  . Drug use: No  . Sexual activity: Yes    Partners: Female  Other Topics Concern  . Not on file  Social History Narrative  Exercise-- walking    Lives alone.   Brother lives in [place], [place]    Family History  Problem Relation Age of Onset  . Hypertension Mother   . Diabetes Mother   . Hypertension Father   . Diabetes Father   . Hyperlipidemia Father     Current Outpatient Medications  Medication Sig Dispense Refill  . amiodarone (PACERONE) 200 MG tablet Take 200 mg by mouth daily.    Marland Kitchen apixaban (ELIQUIS) 5 MG TABS tablet Take 1 tablet (5 mg total) by mouth 2 (two) times daily. 180 tablet 3  . atorvastatin (LIPITOR) 40 MG tablet Take 1 tablet (40 mg  total) by mouth at bedtime.    . blood glucose meter kit and supplies Dispense based on patient and insurance preference. Use as directed once a day. Dx Code E11.9. 1 each 0  . carvedilol (COREG) 6.25 MG tablet Take 1 tablet (6.25 mg total) by mouth 2 (two) times daily with a meal. 60 tablet 3  . digoxin (LANOXIN) 0.125 MG tablet Take 1 tablet (0.125 mg total) by mouth daily. 30 tablet 3  . glucose blood test strip Use as directed once a day.  E11.9 100 each 1  . Lancets MISC Use as directed once a day.  E11.9 100 each 1  . metFORMIN (GLUCOPHAGE) 500 MG tablet Take 1 tablet (500 mg total) by mouth 2 (two) times daily with a meal. 60 tablet 3  . mometasone-formoterol (DULERA) 200-5 MCG/ACT AERO Inhale 2 puffs into the lungs 2 (two) times daily. Further refills per PCP 1 Inhaler 0  . sacubitril-valsartan (ENTRESTO) 49-51 MG Take 1 tablet by mouth 2 (two) times daily.    Marland Kitchen spironolactone (ALDACTONE) 25 MG tablet Take 0.5 tablets (12.5 mg total) by mouth daily. 15 tablet 3  . guaiFENesin (MUCINEX) 600 MG 12 hr tablet Take 1 tablet (600 mg total) by mouth 2 (two) times daily as needed for to loosen phlegm.    . nitroGLYCERIN (NITROSTAT) 0.4 MG SL tablet Place 1 tablet (0.4 mg total) under the tongue every 5 (five) minutes as needed for chest pain. 25 tablet 3   No current facility-administered medications for this encounter.     Vitals:   09/10/17 0946  BP: (!) 108/58  Pulse: 68  SpO2: 91%  Weight: 200 lb (90.7 kg)    PHYSICAL EXAM: General: NAD Neck: No JVD, no thyromegaly or thyroid nodule.  Lungs: Clear to auscultation bilaterally with normal respiratory effort. CV: Nondisplaced PMI.  Heart regular S1/S2, no S3/S4, no murmur.  No peripheral edema.  No carotid bruit.  Normal pedal pulses.  Abdomen: Soft, nontender, no hepatosplenomegaly, no distention.  Skin: Intact without lesions or rashes.  Neurologic: Alert and oriented x 3.  Psych: Normal affect. Extremities: No clubbing or  cyanosis.  HEENT: Normal.    ASSESSMENT & PLAN: 1. Chronic systolic CHF: Ischemic cardiomyopathy. Medtronic ICD single chamber. 2/19 admission for decompensated HF requiring milrinone. ECHO 2/7/019 EF 15% Mildly dilated RV. CPX in 2/19 was suggestive of severe limitation due to HF.  Currently NYHA class II symptoms, seems to be doing better than would be predicted from CPX and recent admission.  He is not volume overloaded on exam and is not on a diuretic.  - Increase Coreg to 6.25 mg bid.  - Continue digoxin 0.125, check level today.  - Continue current Entresto and spironolactone. BMET today.  - Has LBBB with wide QRS, will discuss CRT upgrade with Dr. Caryl Comes.  - Based on  CPX and recent admission, we have been evaluating him for LVAD.  He has undergone most of the workup.  He will be seeing Dr. Prescott Gum today.  However, given recent finding of ILD on CT chest as well as good functional status subjectively, he is not ready for LVAD at this point. Will need to continue to monitor closely.  2. Atrial flutter: Paroxysmal.  Admitted in 2/19 with severe HF decompensation in setting of atrial flutter with RVR.  He was cardioverted and is now on amiodarone.  - Continue amiodarone 200 mg daily, need to check LFTs, TSH, free T3, free T4 today. He will need to have regular eye exams on amiodarone.  - Continue Eliquis 5 mg bid.  - Has seen Dr. Caryl Comes, plan for flutter ablation later this week.  This should allow eventual discontinuation of amiodarone.  3. Atrial fibrillation: Noted post-op CABG.  4. CAD: S/p CABG.  Last intervention was PCI to LCx in 2016.  - With recent decompensation, think he would benefit from RHC/LHC.  Will wait until about a month after atrial flutter ablation prior to holding anticoagulation for cath.  - Continue atorvastatin but need to check lipids now that he is taking it regularly. Increase if LDL > 70.  - No ASA with Eliquis use.  5. Pulmonary fibrosis: PFTs in 2/19 were  restrictive, and high resolution CT showed interstitial lung disease. He will need an appointment with pulmonary, will arrange.   6. OSA: Arrange appt with Dr. Radford Pax.  7. CKD: Stage 3. BMET today.   Followup in 1 month.   Loralie Champagne  09/10/2017

## 2017-09-10 NOTE — Progress Notes (Signed)
Paramedicine Encounter   Patient ID: Vincent Chandler , male,   DOB: [birth year],57 y.o.,  MRN: [id number]   Met patient in clinic today with provider.  Time spent with patient 23mn   Pt reports still feeling good, he denies any complaints, he has ablation on Wednesday.  Heart cath after a few wks post ablation.  pulm fibrosis--could be causing the dry cough.  Labs today.  Increase carvedilol-6.25 BID He is very motivated to improve health to avoid LVAD. Will check in with him next week for a visit.   KMarylouise Stacks EMT-Paramedic 09/10/2017   ACTION: Home visit completed     chf

## 2017-09-11 ENCOUNTER — Telehealth (HOSPITAL_COMMUNITY): Payer: Self-pay

## 2017-09-11 ENCOUNTER — Telehealth (HOSPITAL_COMMUNITY): Payer: Self-pay | Admitting: Pharmacist

## 2017-09-11 ENCOUNTER — Encounter: Payer: Self-pay | Admitting: Cardiothoracic Surgery

## 2017-09-11 LAB — T3, FREE: T3, Free: 2.4 pg/mL (ref 2.0–4.4)

## 2017-09-11 MED ORDER — DIGOXIN 62.5 MCG PO TABS: 0.0625 mg | ORAL_TABLET | Freq: Every day | ORAL | 3 refills | Status: DC

## 2017-09-11 NOTE — Telephone Encounter (Signed)
Notes recorded by Shirley Muscat, RN on 09/11/2017 at 4:09 PM EDT Pt aware of results and agreeable to med changes (changes made in [facility])  ------  Notes recorded by Larey Dresser, MD on 09/11/2017 at 10:03 AM EDT Normal ------  Notes recorded by Larey Dresser, MD on 09/10/2017 at 2:51 PM EDT Digoxin level high, decrease digoxin to 0.0625 daily.
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
## 2017-09-11 NOTE — Anesthesia Preprocedure Evaluation (Addendum)
Anesthesia Evaluation  Patient identified by MRN, date of birth, ID band Patient awake    Reviewed: Allergy & Precautions, H&P , NPO status , Patient's Chart, lab work & pertinent test results, reviewed documented beta blocker date and time   Airway Mallampati: III  TM Distance: >3 FB Neck ROM: Full    Dental no notable dental hx. (+) Teeth Intact, Dental Advisory Given   Pulmonary asthma , sleep apnea ,    Pulmonary exam normal breath sounds clear to auscultation       Cardiovascular Exercise Tolerance: Good hypertension, Pt. on medications and Pt. on home beta blockers + CAD, + Past MI, + Cardiac Stents, + CABG and +CHF  + dysrhythmias Atrial Fibrillation + Cardiac Defibrillator  Rhythm:Regular Rate:Normal     Neuro/Psych  Headaches, Anxiety Depression    GI/Hepatic Neg liver ROS, GERD  ,  Endo/Other  diabetes, Type 2, Oral Hypoglycemic Agents  Renal/GU Renal InsufficiencyRenal disease  negative genitourinary   Musculoskeletal   Abdominal   Peds  Hematology negative hematology ROS (+)   Anesthesia Other Findings   Reproductive/Obstetrics negative OB ROS                            Anesthesia Physical Anesthesia Plan  ASA: IV  Anesthesia Plan: General   Post-op Pain Management:    Induction: Intravenous  PONV Risk Score and Plan: 3 and Ondansetron, Midazolam, Treatment may vary due to age or medical condition and Dexamethasone  Airway Management Planned: Oral ETT  Additional Equipment: Arterial line  Intra-op Plan:   Post-operative Plan: Extubation in OR  Informed Consent: I have reviewed the patients History and Physical, chart, labs and discussed the procedure including the risks, benefits and alternatives for the proposed anesthesia with the patient or authorized representative who has indicated his/her understanding and acceptance.   Dental advisory given  Plan  Discussed with: CRNA  Anesthesia Plan Comments:         Anesthesia Quick Evaluation

## 2017-09-11 NOTE — Telephone Encounter (Signed)
[employer] patient assistance approved for Eliquis 5 mg BID through 07/02/18.   Ruta Hinds. Velva Harman, PharmD, BCPS, CPP Clinical Pharmacist Phone: [phone] 09/11/2017 3:28 PM

## 2017-09-12 ENCOUNTER — Encounter (HOSPITAL_COMMUNITY)
Admission: RE | Disposition: A | Discharge status: Home / Self Care | Payer: Self-pay | Source: Ambulatory Visit | Attending: Internal Medicine

## 2017-09-12 ENCOUNTER — Encounter (HOSPITAL_COMMUNITY): Payer: Self-pay | Admitting: Certified Registered Nurse Anesthetist

## 2017-09-12 ENCOUNTER — Telehealth (HOSPITAL_COMMUNITY): Payer: Self-pay | Admitting: Vascular Surgery

## 2017-09-12 ENCOUNTER — Other Ambulatory Visit (HOSPITAL_COMMUNITY): Payer: Self-pay

## 2017-09-12 ENCOUNTER — Ambulatory Visit (HOSPITAL_COMMUNITY)
Admission: RE | Admit: 2017-09-12 | Discharge: 2017-09-12 | Disposition: A | Discharge status: Home / Self Care | Payer: Medicare Other | Source: Ambulatory Visit | Attending: Internal Medicine | Admitting: Internal Medicine

## 2017-09-12 ENCOUNTER — Ambulatory Visit (HOSPITAL_COMMUNITY): Payer: Medicare Other | Admitting: Anesthesiology

## 2017-09-12 DIAGNOSIS — I509 Heart failure, unspecified: Secondary | ICD-10-CM | POA: Diagnosis not present

## 2017-09-12 DIAGNOSIS — M549 Dorsalgia, unspecified: Secondary | ICD-10-CM | POA: Insufficient documentation

## 2017-09-12 DIAGNOSIS — Z7984 Long term (current) use of oral hypoglycemic drugs: Secondary | ICD-10-CM | POA: Insufficient documentation

## 2017-09-12 DIAGNOSIS — I447 Left bundle-branch block, unspecified: Secondary | ICD-10-CM | POA: Insufficient documentation

## 2017-09-12 DIAGNOSIS — J45909 Unspecified asthma, uncomplicated: Secondary | ICD-10-CM | POA: Insufficient documentation

## 2017-09-12 DIAGNOSIS — E119 Type 2 diabetes mellitus without complications: Secondary | ICD-10-CM | POA: Diagnosis not present

## 2017-09-12 DIAGNOSIS — K219 Gastro-esophageal reflux disease without esophagitis: Secondary | ICD-10-CM | POA: Diagnosis not present

## 2017-09-12 DIAGNOSIS — I2510 Atherosclerotic heart disease of native coronary artery without angina pectoris: Secondary | ICD-10-CM | POA: Diagnosis not present

## 2017-09-12 DIAGNOSIS — I110 Hypertensive heart disease with heart failure: Secondary | ICD-10-CM | POA: Insufficient documentation

## 2017-09-12 DIAGNOSIS — I4891 Unspecified atrial fibrillation: Secondary | ICD-10-CM | POA: Insufficient documentation

## 2017-09-12 DIAGNOSIS — Z7901 Long term (current) use of anticoagulants: Secondary | ICD-10-CM | POA: Insufficient documentation

## 2017-09-12 DIAGNOSIS — I5022 Chronic systolic (congestive) heart failure: Secondary | ICD-10-CM | POA: Diagnosis not present

## 2017-09-12 DIAGNOSIS — G8929 Other chronic pain: Secondary | ICD-10-CM | POA: Diagnosis not present

## 2017-09-12 DIAGNOSIS — I10 Essential (primary) hypertension: Secondary | ICD-10-CM | POA: Insufficient documentation

## 2017-09-12 DIAGNOSIS — Z951 Presence of aortocoronary bypass graft: Secondary | ICD-10-CM | POA: Diagnosis not present

## 2017-09-12 DIAGNOSIS — F329 Major depressive disorder, single episode, unspecified: Secondary | ICD-10-CM | POA: Insufficient documentation

## 2017-09-12 DIAGNOSIS — I252 Old myocardial infarction: Secondary | ICD-10-CM | POA: Insufficient documentation

## 2017-09-12 DIAGNOSIS — Z955 Presence of coronary angioplasty implant and graft: Secondary | ICD-10-CM | POA: Diagnosis not present

## 2017-09-12 DIAGNOSIS — I2582 Chronic total occlusion of coronary artery: Secondary | ICD-10-CM | POA: Insufficient documentation

## 2017-09-12 DIAGNOSIS — I483 Typical atrial flutter: Secondary | ICD-10-CM

## 2017-09-12 DIAGNOSIS — E785 Hyperlipidemia, unspecified: Secondary | ICD-10-CM | POA: Diagnosis not present

## 2017-09-12 DIAGNOSIS — G4733 Obstructive sleep apnea (adult) (pediatric): Secondary | ICD-10-CM | POA: Insufficient documentation

## 2017-09-12 DIAGNOSIS — I4892 Unspecified atrial flutter: Secondary | ICD-10-CM | POA: Diagnosis present

## 2017-09-12 DIAGNOSIS — I255 Ischemic cardiomyopathy: Secondary | ICD-10-CM | POA: Diagnosis not present

## 2017-09-12 DIAGNOSIS — Z79899 Other long term (current) drug therapy: Secondary | ICD-10-CM | POA: Insufficient documentation

## 2017-09-12 DIAGNOSIS — I495 Sick sinus syndrome: Secondary | ICD-10-CM | POA: Diagnosis not present

## 2017-09-12 DIAGNOSIS — F419 Anxiety disorder, unspecified: Secondary | ICD-10-CM | POA: Diagnosis not present

## 2017-09-12 DIAGNOSIS — J849 Interstitial pulmonary disease, unspecified: Secondary | ICD-10-CM

## 2017-09-12 DIAGNOSIS — Z95810 Presence of automatic (implantable) cardiac defibrillator: Secondary | ICD-10-CM | POA: Diagnosis not present

## 2017-09-12 HISTORY — PX: A-FLUTTER ABLATION: EP1230

## 2017-09-12 LAB — GLUCOSE, CAPILLARY
Glucose-Capillary: 100 mg/dL — ABNORMAL HIGH (ref 65–99)
Glucose-Capillary: 107 mg/dL — ABNORMAL HIGH (ref 65–99)

## 2017-09-12 SURGERY — A-FLUTTER ABLATION
Anesthesia: General

## 2017-09-12 MED ORDER — HEPARIN (PORCINE) IN NACL 2-0.9 UNIT/ML-% IJ SOLN
INTRAMUSCULAR | Status: AC
  Filled 2017-09-12: qty 500

## 2017-09-12 MED ORDER — MIDAZOLAM HCL 5 MG/5ML IJ SOLN
INTRAMUSCULAR | Status: DC | PRN
  Administered 2017-09-12: 1 mg via INTRAVENOUS

## 2017-09-12 MED ORDER — SODIUM CHLORIDE 0.9% FLUSH: 3.0000 mL | Freq: Two times a day (BID) | INTRAVENOUS | Status: DC

## 2017-09-12 MED ORDER — ETOMIDATE 2 MG/ML IV SOLN
INTRAVENOUS | Status: DC | PRN
  Administered 2017-09-12: 10 mg via INTRAVENOUS

## 2017-09-12 MED ORDER — SODIUM CHLORIDE 0.9 % IV SOLN: 250.0000 mL | INTRAVENOUS | Status: DC | PRN

## 2017-09-12 MED ORDER — BUPIVACAINE HCL (PF) 0.25 % IJ SOLN
INTRAMUSCULAR | Status: AC
  Filled 2017-09-12: qty 30

## 2017-09-12 MED ORDER — PHENYLEPHRINE 40 MCG/ML (10ML) SYRINGE FOR IV PUSH (FOR BLOOD PRESSURE SUPPORT)
PREFILLED_SYRINGE | INTRAVENOUS | Status: DC | PRN
  Administered 2017-09-12: 40 ug via INTRAVENOUS

## 2017-09-12 MED ORDER — SUGAMMADEX SODIUM 200 MG/2ML IV SOLN
INTRAVENOUS | Status: DC | PRN
  Administered 2017-09-12: 200 mg via INTRAVENOUS

## 2017-09-12 MED ORDER — ACETAMINOPHEN 325 MG PO TABS: 650.0000 mg | ORAL_TABLET | ORAL | Status: DC | PRN

## 2017-09-12 MED ORDER — BUPIVACAINE HCL (PF) 0.25 % IJ SOLN
INTRAMUSCULAR | Status: DC | PRN
  Administered 2017-09-12: 30 mL

## 2017-09-12 MED ORDER — ONDANSETRON HCL 4 MG/2ML IJ SOLN
INTRAMUSCULAR | Status: DC | PRN
  Administered 2017-09-12: 4 mg via INTRAVENOUS

## 2017-09-12 MED ORDER — FENTANYL CITRATE (PF) 100 MCG/2ML IJ SOLN
INTRAMUSCULAR | Status: DC | PRN
  Administered 2017-09-12: 100 ug via INTRAVENOUS

## 2017-09-12 MED ORDER — SODIUM CHLORIDE 0.9% FLUSH: 3.0000 mL | INTRAVENOUS | Status: DC | PRN

## 2017-09-12 MED ORDER — ROCURONIUM BROMIDE 100 MG/10ML IV SOLN
INTRAVENOUS | Status: DC | PRN
  Administered 2017-09-12: 50 mg via INTRAVENOUS

## 2017-09-12 MED ORDER — PROPOFOL 10 MG/ML IV BOLUS
INTRAVENOUS | Status: DC | PRN
  Administered 2017-09-12: 50 mg via INTRAVENOUS

## 2017-09-12 MED ORDER — PHENYLEPHRINE HCL 10 MG/ML IJ SOLN
INTRAVENOUS | Status: DC | PRN
  Administered 2017-09-12: 25 ug/min via INTRAVENOUS

## 2017-09-12 MED ORDER — DEXAMETHASONE SODIUM PHOSPHATE 10 MG/ML IJ SOLN
INTRAMUSCULAR | Status: DC | PRN
  Administered 2017-09-12: 10 mg via INTRAVENOUS

## 2017-09-12 MED ORDER — HEPARIN (PORCINE) IN NACL 2-0.9 UNIT/ML-% IJ SOLN
INTRAMUSCULAR | Status: AC | PRN
  Administered 2017-09-12: 500 mL

## 2017-09-12 MED ORDER — SODIUM CHLORIDE 0.9 % IV SOLN
INTRAVENOUS | Status: DC
  Administered 2017-09-12: 07:00:00 via INTRAVENOUS

## 2017-09-12 MED ORDER — ONDANSETRON HCL 4 MG/2ML IJ SOLN: 4.0000 mg | Freq: Four times a day (QID) | INTRAMUSCULAR | Status: DC | PRN

## 2017-09-12 MED ORDER — EPHEDRINE SULFATE-NACL 50-0.9 MG/10ML-% IV SOSY
PREFILLED_SYRINGE | INTRAVENOUS | Status: DC | PRN
  Administered 2017-09-12: 5 mg via INTRAVENOUS

## 2017-09-12 SURGICAL SUPPLY — 13 items
BAG SNAP BAND KOVER 36X36 (MISCELLANEOUS) ×1 IMPLANT
CATH BLAZERPRIME XP LG CV 10MM (ABLATOR) ×1 IMPLANT
CATH DUODECA HALO/ISMUS 7FR (CATHETERS) ×1 IMPLANT
CATH OCTAPOLOR 6F 125CM 2-5-2 (CATHETERS) ×1 IMPLANT
CATH QUAD COURNAND 5FR (CATHETERS) ×1 IMPLANT
PACK EP LATEX FREE (CUSTOM PROCEDURE TRAY) ×2
PACK EP LF (CUSTOM PROCEDURE TRAY) IMPLANT
PAD DEFIB LIFELINK (PAD) ×1 IMPLANT
SHEATH ATRIAL FLUTTER SAFL 8F (SHEATH) ×1 IMPLANT
SHEATH AVANTI 11CM 6FR (SHEATH) ×1 IMPLANT
SHEATH AVANTI 11CM 7FR (SHEATH) ×1 IMPLANT
SHEATH AVANTI 11CM 8FR (SHEATH) ×2 IMPLANT
SHIELD RADPAD SCOOP 12X17 (MISCELLANEOUS) ×1 IMPLANT

## 2017-09-12 NOTE — Anesthesia Procedure Notes (Signed)
Procedure Name: Intubation Date/Time: 09/12/2017 8:47 AM Performed by: Candis Shine, CRNA Pre-anesthesia Checklist: Patient identified, Emergency Drugs available, Suction available and Patient being monitored Patient Re-evaluated:Patient Re-evaluated prior to induction Oxygen Delivery Method: Circle System Utilized Preoxygenation: Pre-oxygenation with 100% oxygen Induction Type: IV induction Ventilation: Mask ventilation without difficulty Laryngoscope Size: Mac and 4 Grade View: Grade II Tube type: Oral Tube size: 7.5 mm Number of attempts: 1 Airway Equipment and Method: Stylet Placement Confirmation: ETT inserted through vocal cords under direct vision,  positive ETCO2 and breath sounds checked- equal and bilateral Secured at: 24 cm Tube secured with: Tape Dental Injury: Teeth and Oropharynx as per pre-operative assessment

## 2017-09-12 NOTE — Telephone Encounter (Signed)
Pt scheduled for CT 3//18/18, pt is aware of appt

## 2017-09-12 NOTE — Interval H&P Note (Signed)
History and Physical Interval Note:  09/12/2017 8:08 AM  Vincent Chandler  has presented today for surgery, with the diagnosis of aflutter  The various methods of treatment have been discussed with the patient and family. After consideration of risks, benefits and other options for treatment, the patient has consented to  Procedure(s): A-FLUTTER ABLATION (N/A) as a surgical intervention .  The patient's history has been reviewed, patient examined, no change in status, stable for surgery.  I have reviewed the patient's chart and labs.  Questions were answered to the patient's satisfaction.     Virl Axe  Noted also to have new LBBB in the context of amiodarone;  With the discontinuation it will be improtant to follow for the loss of LBBB or to consider CRT upgrade   ECG 3/11 sinus

## 2017-09-12 NOTE — Progress Notes (Signed)
Site area: left groin fv sheaths x2 Site Prior to Removal:  Level 0 Pressure Applied For: 15 minutes Manual:   yes Patient Status During Pull:  stable Post Pull Site:  Level 0 Post Pull Instructions Given:  yes Post Pull Pulses Present: palpable left dp Dressing Applied:  Gauze and tegaderm Bedrest begins @  Comments:

## 2017-09-12 NOTE — Progress Notes (Signed)
Site area: left radial a line Site Prior to Removal:  Level 0 Pressure Applied For: 10 minutes Manual:   yes Patient Status During Pull:  stable Post Pull Site:  Level  0 Post Pull Instructions Given:  yes Post Pull Pulses Present: palpable left radial Dressing Applied:  Gauze and tegaderm Bedrest begins @  Comments:

## 2017-09-12 NOTE — Transfer of Care (Signed)
Immediate Anesthesia Transfer of Care Note  Patient: Vincent Chandler  Procedure(s) Performed: A-FLUTTER ABLATION (N/A )  Patient Location: Cath Lab  Anesthesia Type:General  Level of Consciousness: awake, alert  and oriented  Airway & Oxygen Therapy: Patient Spontanous Breathing and Patient connected to nasal cannula oxygen  Post-op Assessment: Report given to RN and Post -op Vital signs reviewed and stable  Post vital signs: Reviewed and stable  Last Vitals:  Vitals:   09/12/17 0624  BP: 95/69  Pulse: 67  Resp: 18  Temp: 36.6 C  SpO2: 91%    Last Pain:  Vitals:   09/12/17 0624  TempSrc: Oral         Complications: No apparent anesthesia complications

## 2017-09-12 NOTE — Progress Notes (Signed)
Patient examined.  Vital signs stable.  Groin is without bleeding.  Will discontinue amiodarone.  Will continue uninterrupted his apixaban.  He has a history of atrial arrhythmias and prior stroke.\  Follow-up with Dr. DM as to the effects on his QRS of discontinuing his amiodarone

## 2017-09-12 NOTE — Anesthesia Postprocedure Evaluation (Signed)
Anesthesia Post Note  Patient: Vincent Chandler  Procedure(s) Performed: A-FLUTTER ABLATION (N/A )     Patient location during evaluation: PACU Anesthesia Type: General Level of consciousness: awake and alert Pain management: pain level controlled Vital Signs Assessment: post-procedure vital signs reviewed and stable Respiratory status: spontaneous breathing, nonlabored ventilation and respiratory function stable Cardiovascular status: blood pressure returned to baseline and stable Postop Assessment: no apparent nausea or vomiting Anesthetic complications: no    Last Vitals:  Vitals:   09/12/17 1130 09/12/17 1133  BP: (!) 143/70   Pulse: 62   Resp: 17   Temp:  (!) 36.2 C  SpO2: 97%     Last Pain:  Vitals:   09/12/17 1133  TempSrc: Temporal                 Chelcie Estorga,W. EDMOND

## 2017-09-12 NOTE — Anesthesia Procedure Notes (Signed)
Arterial Line Insertion Start/End3/13/2019 8:00 AM Performed by: Candis Shine, CRNA, CRNA  Patient location: Pre-op. Preanesthetic checklist: patient identified, IV checked, site marked, risks and benefits discussed, surgical consent, monitors and equipment checked, pre-op evaluation, timeout performed and anesthesia consent Lidocaine 1% used for infiltration Left, radial was placed Catheter size: 20 G Hand hygiene performed  and maximum sterile barriers used   Attempts: 2 Procedure performed without using ultrasound guided technique. Following insertion, dressing applied and Biopatch. Post procedure assessment: normal and unchanged  Patient tolerated the procedure well with no immediate complications.

## 2017-09-12 NOTE — Progress Notes (Signed)
Site area: rt groin fv sheaths x2 Site Prior to Removal:  Level 0 Pressure Applied For: 15 minutes Manual:   yes Patient Status During Pull:  stable Post Pull Site:  Level 0 Post Pull Instructions Given:  yes Post Pull Pulses Present: palpable rt dp Dressing Applied:  Gauze and tegaderm Bedrest begins @ 1130 Comments:

## 2017-09-12 NOTE — Discharge Instructions (Signed)
Angiogram, Care After This sheet gives you information about how to care for yourself after your procedure. Your health care provider may also give you more specific instructions. If you have problems or questions, contact your health care provider. What can I expect after the procedure? After the procedure, it is common to have bruising and tenderness at the catheter insertion area. Follow these instructions at home: Insertion site care  Follow instructions from your health care provider about how to take care of your insertion site. Make sure you: ? Wash your hands with soap and water before you change your bandage (dressing). If soap and water are not available, use hand sanitizer. ? Change your dressing as told by your health care provider. ? Leave stitches (sutures), skin glue, or adhesive strips in place. These skin closures may need to stay in place for 2 weeks or longer. If adhesive strip edges start to loosen and curl up, you may trim the loose edges. Do not remove adhesive strips completely unless your health care provider tells you to do that.  Do not take baths, swim, or use a hot tub until your health care provider approves.  You may shower 24-48 hours after the procedure or as told by your health care provider. ? Gently wash the site with plain soap and water. ? Pat the area dry with a clean towel. ? Do not rub the site. This may cause bleeding.  Do not apply powder or lotion to the site. Keep the site clean and dry.  Check your insertion site every day for signs of infection. Check for: ? Redness, swelling, or pain. ? Fluid or blood. ? Warmth. ? Pus or a bad smell. Activity  Rest as told by your health care provider, usually for 1-2 days.  Do not lift anything that is heavier than 10 lbs. (4.5 kg) or as told by your health care provider.  Do not drive for 24 hours if you were given a medicine to help you relax (sedative).  Do not drive or use heavy machinery while  taking prescription pain medicine. General instructions  Return to your normal activities as told by your health care provider, usually in about a week. Ask your health care provider what activities are safe for you.  If the catheter site starts bleeding, lie flat and put pressure on the site. If the bleeding does not stop, get help right away. This is a medical emergency.  Drink enough fluid to keep your urine clear or pale yellow. This helps flush the contrast dye from your body.  Take over-the-counter and prescription medicines only as told by your health care provider.  Keep all follow-up visits as told by your health care provider. This is important. Contact a health care provider if:  You have a fever or chills.  You have redness, swelling, or pain around your insertion site.  You have fluid or blood coming from your insertion site.  The insertion site feels warm to the touch.  You have pus or a bad smell coming from your insertion site.  You have bruising around the insertion site.  You notice blood collecting in the tissue around the catheter site (hematoma). The hematoma may be painful to the touch. Get help right away if:  You have severe pain at the catheter insertion area.  The catheter insertion area swells very fast.  The catheter insertion area is bleeding, and the bleeding does not stop when you hold steady pressure on the area.  The area near or just beyond the catheter insertion site becomes pale, cool, tingly, or numb. These symptoms may represent a serious problem that is an emergency. Do not wait to see if the symptoms will go away. Get medical help right away. Call your local emergency services (911 in the [place] Do not drive yourself to the hospital. Summary  After the procedure, it is common to have bruising and tenderness at the catheter insertion area.  After the procedure, it is important to rest and drink plenty of fluids.  Do not take baths,  swim, or use a hot tub until your health care provider says it is okay to do so. You may shower 24-48 hours after the procedure or as told by your health care provider.  If the catheter site starts bleeding, lie flat and put pressure on the site. If the bleeding does not stop, get help right away. This is a medical emergency. This information is not intended to replace advice given to you by your health care provider. Make sure you discuss any questions you have with your health care provider. Document Released: 01/05/2005 Document Revised: 05/24/2016 Document Reviewed: 05/24/2016 Elsevier Interactive Patient Education  [employer].

## 2017-09-13 ENCOUNTER — Other Ambulatory Visit (HOSPITAL_COMMUNITY): Payer: Self-pay | Admitting: *Deleted

## 2017-09-13 ENCOUNTER — Other Ambulatory Visit: Payer: Self-pay

## 2017-09-13 ENCOUNTER — Telehealth: Payer: Self-pay | Admitting: Family Medicine

## 2017-09-13 ENCOUNTER — Encounter (HOSPITAL_COMMUNITY): Payer: Self-pay | Admitting: Internal Medicine

## 2017-09-13 MED ORDER — CARVEDILOL 6.25 MG PO TABS: 6.2500 mg | ORAL_TABLET | Freq: Two times a day (BID) | ORAL | 3 refills | Status: DC

## 2017-09-13 MED ORDER — METFORMIN HCL 500 MG PO TABS: 500.0000 mg | ORAL_TABLET | Freq: Two times a day (BID) | ORAL | 1 refills | Status: DC

## 2017-09-13 MED ORDER — SPIRONOLACTONE 25 MG PO TABS: 12.5000 mg | ORAL_TABLET | Freq: Every day | ORAL | 3 refills | Status: DC

## 2017-09-13 MED FILL — SPIRONOLACTONE 25 MG TABLET: 25 | 30 days supply | Qty: 15 | Fill #0

## 2017-09-13 MED FILL — CARVEDILOL 6.25 MG TABLET: 6.25 | 30 days supply | Qty: 60 | Fill #0

## 2017-09-13 MED FILL — DIGOXIN 0.125 MG TABLET: 125 | 30 days supply | Qty: 30 | Fill #1

## 2017-09-13 MED FILL — ATORVASTATIN 40 MG TABLET: 40 | 34 days supply | Qty: 34 | Fill #1

## 2017-09-13 MED FILL — metFORMIN HCL 500 MG TABS: 500 | 30 days supply | Qty: 60 | Fill #0

## 2017-09-13 NOTE — Op Note (Deleted)
  The note originally documented on this encounter has been moved the the encounter in which it belongs.

## 2017-09-13 NOTE — Op Note (Signed)
Vincent Chandler, BARTELL.:  [id number]  MEDICAL RECORD NO.:  [id number]  LOCATION:                               FACILITY:  [facility]  PHYSICIAN:  Deboraha Sprang, MD, FACCDATE OF BIRTH:  Feb 16, 1961  DATE OF PROCEDURE:  09/12/2017 DATE OF DISCHARGE:                              OPERATIVE REPORT   PREOPERATIVE DIAGNOSIS:  Atrial flutter with new left bundle-branch block.  POSTOPERATIVE DIAGNOSIS:  Atrial flutter with new left bundle-branch block with prolonged AV Wenckebach and a long HV.  PROCEDURES: 1. Invasive electrophysiological study. 2. Electrogram mapping. 3. Catheter ablation.  DESCRIPTION OF PROCEDURE:  Following obtaining an informed consent, the patient was brought to electrophysiology laboratory and placed on the fluoroscopic table in supine position.  The patient was submitted to general anesthesia under the care of Dr. Ola Spurr.  After routine prep and drape, cardiac catheterization was performed with local anesthesia and conscious sedation.  Following the procedure, the catheters were removed.  The patient was transferred to holding area in stable condition for sheath removal.  Catheters: 1. A 5-French quadripolar catheter was inserted via the left femoral     vein to the AV junction. 2. A 6-French octapolar catheter was inserted via the right femoral     vein to the coronary sinus. 3. A 7-French Duo-Decapolar catheter was inserted via the left femoral     vein to the tricuspid anulus. 4. An 8-French 10 mm deflectable tip ablation catheter was inserted     via the right femoral vein to mapping sites in the posterior septal     space. Surface leads:  I, aVF, and V1 were monitored continuously throughout the procedure.  Following insertion of the catheters, a stimulation protocol included incremental atrial pacing.  Incremental ventricular pacing.  RESULTS OF BASELINE MEASUREMENTS:  Initial: Rhythm:  Sinus; RR interval 1037 milliseconds;  PR interval 261 milliseconds; P-wave duration 152 millisecond; QRS duration 183 millisecond; QT interval 504 milliseconds.  Left bundle-branch block was present.  I could not find the HV interval.  Final: Rhythm:  Sinus; RR interval 1276 milliseconds; PR interval 284 milliseconds; P-wave duration 172 milliseconds; QRS duration 202 milliseconds; QT interval not measured.  HV interval was 80 milliseconds.  AV NODAL FUNCTION:  Post ablation, the AV Wenckebach cycle length was 850 milliseconds. VA conduction was dissociated 600 milliseconds. Dual AV nodal physiology was not evident. No evidence of accessory pathway. Ventricular response programmed stimulation was normal for ventricular stimulation as described.  RADIOFREQUENCY ENERGY:  A total of 4.5 minutes of RF energy was applied across the cavotricuspid isthmus resulting in obliteration of atrial electrograms and the development of bidirectional cavotricuspid isthmus conduction block.  FLUOROSCOPY TIME:  A total of 11 minutes of fluoroscopy time was utilized.  IMPRESSION: 1. Sinus bradycardia. 2. Abnormal AV nodal function manifested by prolonged Wenckebach cycle     lengths. 3. Abnormal His-Purkinje system function with prolonged HV interval. 4. Abnormal atrial function manifested by sustained atrial flutter. 5. Normal ventricular response to programmed stimulation. 6. No accessory pathway.  SUMMARY:  In conclusion, the results of electrophysiological testing confirmed cavotricuspid isthmus conduction as the substrate likely underlying the patient's atrial flutter.  Catheter ablation was successful in interrupting this with a stim A2 interval of 250 milliseconds.  The presence of amiodarone decreases the specificity of all end points.  The left bundle-branch block, which is new, and sinus bradycardia with prolonged AV Wenckebach cycle length may also be a consequence of the amiodarone.  Amiodarone will be discontinued  and washed out.  In the event that sinus bradycardia and left bundle-branch block persist, CRT upgrade would be appropriately considered.  As the patient was in sinus rhythm at the time of the procedure, with cardioversion, Eliquis will not be resumed.     Deboraha Sprang, MD, FACC   ______________________________ Deboraha Sprang, MD, [facility]    SCK/MEDQ  D:  09/12/2017  T:  09/13/2017  Job:  [id number]

## 2017-09-13 NOTE — Telephone Encounter (Signed)
Spoke with pt. About Metformin - he states he is still taking 500 mg 1 po twice a day. States " the pharmacy probably got confused."

## 2017-09-13 NOTE — Telephone Encounter (Signed)
Copied from [place]. Topic: Quick Communication - See Telephone Encounter >> Sep 13, 2017  9:45 AM Ahmed Prima L wrote: CRM for notification. See Telephone encounter for:   09/13/17.  [facility] called and said that the patient told him that the metFORMIN (GLUCOPHAGE) 500 MG tablet needed to be changed to 2 tablets twice a day or change 1035m twice a day

## 2017-09-17 ENCOUNTER — Ambulatory Visit (HOSPITAL_COMMUNITY)
Admission: RE | Admit: 2017-09-17 | Discharge: 2017-09-17 | Disposition: A | Discharge status: Home / Self Care | Payer: Medicare Other | Source: Ambulatory Visit | Attending: Cardiology | Admitting: Cardiology

## 2017-09-17 DIAGNOSIS — Z9889 Other specified postprocedural states: Secondary | ICD-10-CM | POA: Diagnosis not present

## 2017-09-17 DIAGNOSIS — I700 Atherosclerosis of aorta: Secondary | ICD-10-CM | POA: Diagnosis not present

## 2017-09-17 DIAGNOSIS — I2510 Atherosclerotic heart disease of native coronary artery without angina pectoris: Secondary | ICD-10-CM | POA: Insufficient documentation

## 2017-09-17 DIAGNOSIS — R918 Other nonspecific abnormal finding of lung field: Secondary | ICD-10-CM | POA: Diagnosis not present

## 2017-09-17 DIAGNOSIS — J849 Interstitial pulmonary disease, unspecified: Secondary | ICD-10-CM | POA: Insufficient documentation

## 2017-09-18 ENCOUNTER — Ambulatory Visit: Payer: Self-pay | Admitting: Internal Medicine

## 2017-09-18 ENCOUNTER — Other Ambulatory Visit: Payer: Self-pay | Admitting: *Deleted

## 2017-09-19 ENCOUNTER — Other Ambulatory Visit (HOSPITAL_COMMUNITY): Payer: Self-pay

## 2017-09-19 ENCOUNTER — Ambulatory Visit (INDEPENDENT_AMBULATORY_CARE_PROVIDER_SITE_OTHER): Payer: Medicare Other | Admitting: *Deleted

## 2017-09-19 ENCOUNTER — Other Ambulatory Visit: Payer: Self-pay | Admitting: *Deleted

## 2017-09-19 DIAGNOSIS — I255 Ischemic cardiomyopathy: Secondary | ICD-10-CM

## 2017-09-19 MED ORDER — ACCU-CHEK FASTCLIX LANCETS MISC: 1 refills | Status: AC

## 2017-09-19 MED ORDER — GLUCOSE BLOOD VI STRP: ORAL_STRIP | 1 refills | Status: AC

## 2017-09-19 MED ORDER — ACCU-CHEK GUIDE W/DEVICE KIT: 1.0000 | PACK | Freq: Every day | 0 refills | Status: AC

## 2017-09-19 NOTE — Progress Notes (Signed)
Remote ICD transmission.   

## 2017-09-19 NOTE — Progress Notes (Signed)
Paramedicine Encounter    Patient ID: Vincent Chandler, male    DOB: 01/10/1961, 57 y.o.   MRN: [id number]   Patient Care Team: [name], Alferd Apa, DO as PCP - General (Family Medicine) Burtis Junes, NP as Nurse Practitioner (Nurse Practitioner) Deboraha Sprang, MD as Consulting Physician (Cardiology) Allyn Kenner, MD (Dermatology)  Patient Active Problem List   Diagnosis Date Noted  . Retained dental root 08/30/2017  . Dental caries 08/30/2017  . Chronic periodontitis 08/30/2017  . Atrial flutter ([place]) 08/08/2017  . Dyspnea 08/07/2017  . Chronic systolic heart failure ([place]) 08/07/2017  . Hyperglycemia 08/07/2017  . Lower respiratory infection 08/07/2017  . Screening for colon cancer 08/07/2017  . CKD (chronic kidney disease) 2-3 07/19/2014  . NSTEMI (non-ST elevated myocardial infarction) ([place]) 07/18/2014  . OSA (obstructive sleep apnea) 07/18/2014  . Tachycardia   . V tach ([place]) 07/17/2014  . Hypoxemia 07/17/2014  . CHF (congestive heart failure) ([place]) 06/04/2012  . Sinus tachycardia 12/29/2010  . Coronary artery disease prior RCA stent with new inferior Q waves 10/18/2010  . Ischemic and nonischemic cardiomyopathy   10/18/2010  . Uncontrolled type 2 diabetes mellitus without complication, without long-term current use of insulin ([place]) 03/10/2010  . Hyperlipidemia 03/10/2010  . Essential hypertension 03/10/2010    Current Outpatient Medications:  .  ACCU-CHEK FASTCLIX LANCETS MISC, Use as directed once a day.  Dx code: E11.9, Disp: 100 each, Rfl: 1 .  apixaban (ELIQUIS) 5 MG TABS tablet, Take 1 tablet (5 mg total) by mouth 2 (two) times daily., Disp: 180 tablet, Rfl: 3 .  atorvastatin (LIPITOR) 40 MG tablet, Take 1 tablet (40 mg total) by mouth at bedtime., Disp: , Rfl:  .  blood glucose meter kit and supplies, Dispense based on patient and insurance preference. Use as directed once a day. Dx Code E11.9., Disp: 1 each, Rfl: 0 .  Blood Glucose Monitoring Suppl (ACCU-CHEK  GUIDE) w/Device KIT, 1 each by Does not apply route daily. DX Code: E11.9, Disp: 1 kit, Rfl: 0 .  carvedilol (COREG) 6.25 MG tablet, Take 1 tablet (6.25 mg total) by mouth 2 (two) times daily with a meal., Disp: 60 tablet, Rfl: 3 .  Digoxin 62.5 MCG TABS, Take 0.0625 mg by mouth daily., Disp: 30 tablet, Rfl: 3 .  glucose blood (ACCU-CHEK GUIDE) test strip, Use as directed once a day.  Dx code: E11.9, Disp: 100 each, Rfl: 1 .  metFORMIN (GLUCOPHAGE) 500 MG tablet, Take 1 tablet (500 mg total) by mouth 2 (two) times daily with a meal., Disp: 60 tablet, Rfl: 1 .  mometasone-formoterol (DULERA) 200-5 MCG/ACT AERO, Inhale 2 puffs into the lungs 2 (two) times daily. Further refills per PCP, Disp: 1 Inhaler, Rfl: 0 .  sacubitril-valsartan (ENTRESTO) 49-51 MG, Take 1 tablet by mouth 2 (two) times daily., Disp: , Rfl:  .  spironolactone (ALDACTONE) 25 MG tablet, Take 0.5 tablets (12.5 mg total) by mouth daily., Disp: 15 tablet, Rfl: 3 .  amiodarone (PACERONE) 200 MG tablet, Take 200 mg by mouth daily., Disp: , Rfl:  .  guaiFENesin (MUCINEX) 600 MG 12 hr tablet, Take 1 tablet (600 mg total) by mouth 2 (two) times daily as needed for to loosen phlegm. (Patient not taking: Reported on 09/19/2017), Disp: , Rfl:  .  nitroGLYCERIN (NITROSTAT) 0.4 MG SL tablet, Place 1 tablet (0.4 mg total) under the tongue every 5 (five) minutes as needed for chest pain. (Patient not taking: Reported on 09/19/2017), Disp: 25 tablet, Rfl: 3  No Known Allergies    Social History   Socioeconomic History  . Marital status: Divorced    Spouse name: n/a  . Number of children: 0  . Years of education: 12th grade  . Highest education level: Not on file  Social Needs  . Financial resource strain: Not on file  . Food insecurity - worry: Not on file  . Food insecurity - inability: Not on file  . Transportation needs - medical: Not on file  . Transportation needs - non-medical: Not on file  Occupational History  . Occupation:  Data processing manager ---self employed    Employer: [employer]  Tobacco Use  . Smoking status: Never Smoker  . Smokeless tobacco: Current User    Types: Chew  Substance and Sexual Activity  . Alcohol use: Yes    Alcohol/week: 3.6 oz    Types: 6 Shots of liquor per week  . Drug use: No  . Sexual activity: Yes    Partners: Female  Other Topics Concern  . Not on file  Social History Narrative   Exercise-- walking    Lives alone.   Brother lives in [place], [place]    Physical Exam      Future Appointments  Date Time Provider [place]  09/25/2017  4:00 PM Brand Males, MD [facility] None  10/19/2017 11:40 AM Larey Dresser, MD [facility] None  10/25/2017  1:45 PM Deboraha Sprang, MD [facility]  11/08/2017 11:15 AM Etter Sjogren Koren Shiver, DO [facility]  12/19/2017  9:40 AM [facility] DEVICE REMOTES [facility]    BP 108/60   Pulse 64   Resp 15   SpO2 95%   Weight yesterday-200 Last visit weight-200 [facility]-160  Pt reports he is feeling good, he states he was told to stop the amiodarone post ablation but still listed in Epic. He has not been taking it since the procedure.  He is going out of town until Sunday. He has a pil box ready to take with him. No sob, he has been having issues with his sinus. He is limiting his portion sizes now and trying to keep on low sodium diet.  Pt denies any sob, no dizziness, no bleeding issues. No abd pain. No missed doses of meds this week.    Marylouise Stacks, [facility] Paramedic  09/19/17

## 2017-09-20 ENCOUNTER — Encounter (HOSPITAL_COMMUNITY): Payer: Self-pay | Admitting: Unknown Physician Specialty

## 2017-09-20 ENCOUNTER — Other Ambulatory Visit: Payer: Self-pay | Admitting: *Deleted

## 2017-09-20 ENCOUNTER — Encounter: Payer: Self-pay | Admitting: Cardiology

## 2017-09-21 ENCOUNTER — Other Ambulatory Visit (HOSPITAL_COMMUNITY): Payer: Self-pay | Admitting: Dentistry

## 2017-09-21 ENCOUNTER — Encounter (HOSPITAL_COMMUNITY): Payer: Self-pay | Admitting: Unknown Physician Specialty

## 2017-09-21 ENCOUNTER — Institutional Professional Consult (permissible substitution): Payer: Self-pay | Admitting: Internal Medicine

## 2017-09-24 ENCOUNTER — Other Ambulatory Visit: Payer: Self-pay | Admitting: Cardiology

## 2017-09-25 ENCOUNTER — Ambulatory Visit (INDEPENDENT_AMBULATORY_CARE_PROVIDER_SITE_OTHER): Payer: Medicare Other | Admitting: Internal Medicine

## 2017-09-25 ENCOUNTER — Encounter: Payer: Self-pay | Admitting: Internal Medicine

## 2017-09-25 ENCOUNTER — Other Ambulatory Visit (INDEPENDENT_AMBULATORY_CARE_PROVIDER_SITE_OTHER): Payer: Medicare Other

## 2017-09-25 VITALS — BP 124/80 | HR 72 | Ht 70.0 in | Wt 202.2 lb

## 2017-09-25 DIAGNOSIS — I255 Ischemic cardiomyopathy: Secondary | ICD-10-CM

## 2017-09-25 DIAGNOSIS — E119 Type 2 diabetes mellitus without complications: Secondary | ICD-10-CM | POA: Diagnosis not present

## 2017-09-25 DIAGNOSIS — J849 Interstitial pulmonary disease, unspecified: Secondary | ICD-10-CM

## 2017-09-25 LAB — CARDIAC PANEL
CK MB: 7.4 ng/mL — AB (ref 0.3–4.0)
RELATIVE INDEX: 11.2 calc — AB (ref 0.0–2.5)
Total CK: 66 U/L (ref 7–232)

## 2017-09-25 LAB — SEDIMENTATION RATE: Sed Rate: 18 mm/hr (ref 0–20)

## 2017-09-25 LAB — HM DIABETES EYE EXAM

## 2017-09-25 NOTE — Progress Notes (Signed)
Subjective:    Patient ID: Vincent Chandler, male    DOB: [birth year], 57 y.o.   MRN: [id number]   PCP [name], Alferd Apa, DO  HPI  IOV 09/25/2017  Chief Complaint  Patient presents with  . Consult    Referred by Dr. Loralie Champagne for ILD.  HRCT 3/19/189.  Pt denies any complaint of cough, SOB or CP.     57 year old male with chronic systolic heart failure secondary to ischemic heart disease with chronic kidney disease and diabetes and obstructive sleep apnea not yet on CPAP treatment.  This concerned now that he has interstitial lung disease/pulmonary fibrosis not otherwise specified and therefore has been referred here by the cardiology heart failure team  History is gained from talking to the patient and review of the chart.  According to him he had coronary artery bypass grafting in 2014 that then left him with chronic systolic heart failure.  Then in 2016 he had a stent placed and had an admission for that (CT angio  At that time - effusion +/- interistial prominence).  After that he has been admission free up until early February 2019 when he was admitted for acute on chronic systolic heart failure.  During this time he was in atrial fibrillation and then according to his history was loaded with IV amiodarone [it appears he might of failed cardioversion] he was diuresed and discharged on oral amiodarone on August 17, 2017 after a 10-day hospital stay..  With the diuresis he continued to feel better and his shortness of breath and cough improved although he had significant improvement with shortness of breath more than cough.  Just prior to discharge from the hospital he had CT chest (non-HRCT that suggested ILD). Pulmonary function testing same day 08/16/17 that showed moderate restriction with significant severe reduction in diffusion capacity.  Then on August 20, 2017 he had a transesophageal echocardiogram with ejection fraction 15%.  This was then followed with a pulmonary stress test  on August 24, 2017 that showed a VO2 max of 12 with high risk heart failure features.  He did desaturate below 90%. Then on September 12, 2017 he had status post ablation for his atrial fibrillation and was able to come off the amiodarone.  Since coming off the amiodarone his cough is continued to improve and he relates his improvement in cough to stopping the amiodarone.  Then in 09/17/17 - > HRCT  - read by Thoracic radiology as Probable UIP (> 50-80% this is UIP; review of prior CT shows heart failure features - so this is new finding. Personally visualized CT). And on 09/25/2017 => walk test - Walking desaturation test on 09/25/2017 185 feet x 3 laps on ROOM AIR:  did walk all 3 at normal pace. Did not have complaints. Didnot desaturate. Rest pulse ox was 98%, final pulse ox was 90%. HR response 72/min at rest to 88/min at peak exertion. Patient Vincent Chandler  Did no6 Desaturate < 88% . Vincent Chandler did yes  Desaturated </= 3% points. Vincent Chandler did not get tachyardic. These features are c/w MILD/EARLY ILD   Noted: he is considering for LVAD but CVTS feels he is high risk due to ILD and RV failure. Not clear if transplant is part of his goal   ACCP ILD question  - cough x 6 months. 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 Range 08/16/2017 14:34  FVC-Pre Latest Units: L 2.89  FVC-%Pred-Pre Latest Units: % 61  FEV1-Pre Latest Units: L 2.44  FEV1-%Pred-Pre Latest Units: % 67  Pre FEV1/FVC ratio Latest Units: % 84  FEV1FVC-%Pred-Pre Latest Units: % 110   Results for Vincent Chandler (MRN [id number]) as of 09/25/2017 16:12   Ref. Range 08/16/2017 14:34  TLC Latest Units: L 4.09  TLC % pred Latest Units: % 60   Results for Vincent Chandler (MRN [id number]) as of 09/25/2017 16:12  Ref. Range 08/16/2017 14:34  DLCO cor Latest Units: ml/min/mmHg 8.06  DLCO cor % pred Latest Units: % 26       HRCT IMPRESSION 09/17/17   Lungs/Pleura: High-resolution images again demonstrate widespread but patchy areas of ground-glass attenuation, extensive septal thickening, thickening of the peribronchovascular interstitium, mild cylindrical bronchiectasis, with a definitive craniocaudal gradient. No honeycombing inspiratory and expiratory imaging no acute consolidative airspace disease. No pleural effusions. No suspicious appearing pulmonary nodules or masses are noted.  1. The appearance of the lungs is compatible with interstitial lung disease. The pattern is considered a probable usual interstitial pneumonia (UIP) CT pattern, however, at this time, no definitive honeycombing is identified. Outpatient referral to Pulmonology for further evaluation is suggested in the near future if not already obtained. Repeat high-resolution chest CT is recommended in 12 months to assess for temporal changes in the appearance of the lung parenchyma. 2. Aortic atherosclerosis, in addition to 3 vessel coronary artery disease. Patient is status post median sternotomy for CABG including LIMA to the LAD. 3. Additional incidental findings, as above.  Aortic Atherosclerosis (ICD10-I70.0).   Electronically Signed   By: Vinnie Langton M.D.    Results for Vincent Chandler (MRN [id number]) as of 09/25/2017 16:12  Ref. Range 08/17/2017 04:24 08/27/2017 12:27 09/03/2017 10:04 09/07/2017 09:55 09/10/2017 10:21  GFR, Est Non African American Latest Ref Range: >60 mL/min  43 (L) 40 (L) 50 (L) 51 (L)  GFR, Est African American Latest Ref Range: >60 mL/min  49 (L) 46 (L) 58 (L) 59 (L)    has a past medical history of Anginal pain ([place]), Anxiety, Asthma,  Atrial fibrillation-postoperative (11/28/2012), Cardiomyopathy, CHF (congestive heart failure) (HCC), Chronic back pain, Coronary artery disease, Depression, Diabetes mellitus type II (dx'd in the 1990's), GERD (gastroesophageal reflux disease), H/O hiatal hernia, History of hypogonadism, Hyperlipidemia, Hypertension, Kidney stones, Migraines, OSA on CPAP, Pneumonia (3-4 times), and Urinary incontinence.   reports that he has never smoked. His smokeless tobacco use includes chew.  Past Surgical History:  Procedure Laterality Date  . A-FLUTTER ABLATION N/A 09/12/2017   Procedure: A-FLUTTER ABLATION;  Surgeon: Deboraha Sprang, MD;  Location: [facility];  Service: Cardiovascular;  Laterality: N/A;  . CARDIAC DEFIBRILLATOR PLACEMENT  01/15/2015  . CARDIOVERSION N/A 08/10/2017   Procedure: CARDIOVERSION;  Surgeon: Larey Dresser, MD;  Location: [facility] ENDOSCOPY;  Service: Cardiovascular;  Laterality: N/A;  . CORONARY ANGIOPLASTY WITH STENT PLACEMENT  ~ 2003  . CORONARY ARTERY BYPASS GRAFT N/A 08/15/2012   Procedure: CORONARY ARTERY BYPASS GRAFTING (CABG);  Surgeon: Ivin Poot, MD;  Location: [facility];  Service: Open Heart Surgery;  Laterality: N/A;  Coronary Artery Bypass Grafting Times Four Using Left Internal Mammary Artery and Right Saphenous Leg Vein Harvested Endoscopically  . EP IMPLANTABLE DEVICE N/A 01/15/2015   Procedure: ICD Implant;  Surgeon: Deboraha Sprang, MD;  Location: [place] [facility];  Service: Cardiovascular;  Laterality: N/A;  . LEFT HEART CATHETERIZATION WITH CORONARY ANGIOGRAM N/A 08/08/2012   Procedure: LEFT HEART CATHETERIZATION WITH CORONARY ANGIOGRAM;  Surgeon: Peter M Martinique, MD;  Location: [facility] CATH LAB;  Service: Cardiovascular;  Laterality: N/A;  . LEFT HEART CATHETERIZATION WITH CORONARY/GRAFT ANGIOGRAM N/A 07/21/2014   Procedure: LEFT HEART CATHETERIZATION WITH Beatrix Fetters;  Surgeon: Larey Dresser, MD;  [facility] LAB;  Service: Cardiovascular;   Laterality: N/A;  . PERCUTANEOUS CORONARY STENT INTERVENTION (PCI-S)  07/21/2014   Procedure: PERCUTANEOUS CORONARY STENT INTERVENTION (PCI-S);  Surgeon: Larey Dresser, MD;  Location: [facility] CATH LAB;  Service: Cardiovascular;;  . REFRACTIVE SURGERY Bilateral 1990's  . TEE WITHOUT CARDIOVERSION N/A 08/10/2017   Procedure: TRANSESOPHAGEAL ECHOCARDIOGRAM (TEE);  Surgeon: Larey Dresser, MD;  Location: [facility] ENDOSCOPY;  Service: Cardiovascular;  Laterality: N/A;  . TONSILLECTOMY  ~ 1970    No Known Allergies  Immunization History  Administered Date(s) Administered  . Influenza Split 06/05/2012  . Influenza Whole 05/09/2010  . Influenza,inj,Quad PF,6+ Mos 04/09/2013, 06/05/2014, 08/17/2017  . Influenza-Unspecified 04/03/2015  . Pneumococcal Conjugate-13 06/05/2014  . Pneumococcal Polysaccharide-23 02/17/2012  . Td 05/09/2010    Family History  Problem Relation Age of Onset  . Hypertension Mother   . Diabetes Mother   . Hypertension Father   . Diabetes Father   . Hyperlipidemia Father      Current Outpatient Medications:  .  ACCU-CHEK FASTCLIX LANCETS MISC, Use as directed once a day.  Dx code: E11.9, Disp: 100 each, Rfl: 1 .  apixaban (ELIQUIS) 5 MG TABS tablet, Take 1 tablet (5 mg total) by mouth 2 (two) times daily., Disp: 180 tablet, Rfl: 3 .  atorvastatin (LIPITOR) 40 MG tablet, Take 1 tablet (40 mg total) by mouth at bedtime., Disp: , Rfl:  .  blood glucose meter kit and supplies, Dispense based on patient and insurance preference. Use as directed once a day. Dx Code E11.9., Disp: 1 each, Rfl: 0 .  Blood Glucose Monitoring Suppl (ACCU-CHEK GUIDE) w/Device KIT, 1 each by Does not apply route daily. DX Code: E11.9, Disp: 1 kit, Rfl: 0 .  carvedilol (COREG) 6.25 MG tablet, Take 1 tablet (6.25 mg total) by mouth 2 (two) times daily with a meal., Disp: 60 tablet, Rfl: 3 .  Digoxin 62.5 MCG TABS, Take 0.0625 mg by mouth daily. (Patient taking differently: Take 0.5 tablets by mouth daily.  ), Disp: 30 tablet, Rfl: 3 .  glucose blood (ACCU-CHEK GUIDE) test strip, Use as directed once a day.  Dx code: E11.9, Disp: 100 each, Rfl: 1 .  metFORMIN (GLUCOPHAGE) 500 MG tablet, Take 1 tablet (500 mg total) by mouth 2 (two) times daily with a meal., Disp: 60 tablet, Rfl: 1 .  mometasone-formoterol (DULERA) 200-5 MCG/ACT AERO, Inhale 2 puffs into the lungs 2 (two) times daily. Further refills per PCP, Disp: 1 Inhaler, Rfl: 0 .  sacubitril-valsartan (ENTRESTO) 49-51 MG, Take 1 tablet by mouth 2 (two) times daily., Disp: , Rfl:  .  spironolactone (ALDACTONE) 25 MG tablet, Take 0.5 tablets (12.5 mg total) by mouth daily., Disp: 15 tablet, Rfl: 3 .  amiodarone (PACERONE) 200 MG tablet, Take 200 mg by mouth daily., Disp: , Rfl:  .  guaiFENesin ([place])  600 MG 12 hr tablet, Take 1 tablet (600 mg total) by mouth 2 (two) times daily as needed for to loosen phlegm. (Patient not taking: Reported on 09/25/2017), Disp: , Rfl:  .  nitroGLYCERIN (NITROSTAT) 0.4 MG SL tablet, Place 1 tablet (0.4 mg total) under the tongue every 5 (five) minutes as needed for chest pain. (Patient not taking: Reported on 09/25/2017), Disp: 25 tablet, Rfl: 3    Review of Systems  Constitutional: Negative for unexpected weight change.  HENT: Positive for dental problem and sinus pressure. Negative for congestion, ear pain, nosebleeds, postnasal drip, rhinorrhea, sneezing, sore throat and trouble swallowing.   Eyes: Negative for redness and itching.  Respiratory: Positive for cough and wheezing. Negative for chest tightness and shortness of breath.   Cardiovascular: Negative for palpitations and leg swelling.  Gastrointestinal: Negative for nausea and vomiting.  Genitourinary: Negative for dysuria.  Musculoskeletal: Negative for joint swelling.  Skin: Negative for rash.  Allergic/Immunologic: Negative.  Negative for environmental allergies, food allergies and immunocompromised state.  Neurological: Negative for headaches.    Hematological: Bruises/bleeds easily.  Psychiatric/Behavioral: Negative for dysphoric mood. The patient is not nervous/anxious.        Objective:   Physical Exam  Constitutional: He is oriented to person, place, and time. He appears well-developed and well-nourished. No distress.  HENT:  Head: Normocephalic and atraumatic.  Right Ear: External ear normal.  Left Ear: External ear normal.  Mouth/Throat: Oropharynx is clear and moist. No oropharyngeal exudate.  Eyes: Pupils are equal, round, and reactive to light. Conjunctivae and EOM are normal. Right eye exhibits no discharge. Left eye exhibits no discharge. No scleral icterus.  Neck: Normal range of motion. Neck supple. No JVD present. No tracheal deviation present. No thyromegaly present.  Cardiovascular: Normal rate, regular rhythm and intact distal pulses. Exam reveals no gallop and no friction rub.  No murmur heard. Chest scar  Pulmonary/Chest: Effort normal and breath sounds normal. No respiratory distress. He has no wheezes. He has no rales. He exhibits no tenderness.  Could not fully appreciate cranio-caudal gradient crackles that are typical of UIP. In fact could not appreciate crackles at all  Abdominal: Soft. Bowel sounds are normal. He exhibits no distension and no mass. There is no tenderness. There is no rebound and no guarding.  Musculoskeletal: Normal range of motion. He exhibits no edema or tenderness.  Lymphadenopathy:    He has no cervical adenopathy.  Neurological: He is alert and oriented to person, place, and time. He has normal reflexes. No cranial nerve deficit. Coordination normal.  Skin: Skin is warm and dry. No rash noted. He is not diaphoretic. No erythema. No pallor.  Psychiatric: He has a normal mood and affect. His behavior is normal. Judgment and thought content normal.  Nursing note and vitals reviewed.   Vitals:   09/25/17 1602  BP: 124/80  Pulse: 72  SpO2: 98%  Weight: 202 lb 3.2 oz (91.7 kg)   Height: 5' 10" (1.778 m)    Estimated body mass index is 29.01 kg/m as calculated from the following:   Height as of this encounter: 5' 10" (1.778 m).   Weight as of this encounter: 202 lb 3.2 oz (91.7 kg).       Assessment & Plan:     ICD-10-CM   1. ILD (interstitial lung disease) ([place]) J84.9    A) Is possible has had ILD since 2016 atleast based on CT reports although with systolic CHF and non-HRCT supine/prone this can be hard  to figure out. If he had ILD male gender and current descrition of probably UIP makes it high intermediate prob that he has IPF.   B) However, he might not have had ILD since admission in feb 2019 and amiodarone Rx and this could be subacute amio lung toxicity. Altenratively,   C) Cmbination of B superimposed on A  D) Other etiologies such as autpimmune  E) given improvement in cough after dc amio - wondering if we are dealing with amio rlated lung injury.   PLAN - bx for him will be high risk - best is to check autoimmune, hypersensitivity and vsculitis panel - if this is indetermintae can consider bronchoscopy BAL to narrow down differential - And then based on above  + followup data with PFT decide on best course (I/e, empiric steroid v immune modulators v anti-fibriotic v supporoitive followup  D/w DR [name] who is in agreement  Followup  - will call back with results and post discussion with Dr [name]  -= do PFT in 1 month ? Will have improvement sponatenously if due to [facility]  - return in 1 month to ILD clinic       Dr. Brand Males, M.D., [facility].C.P Pulmonary and Critical Care Medicine Staff Physician, [place] Director - Interstitial Lung Disease  Program  Pulmonary [place] at [place], [place], [address]  Pager: [phone], If no answer or between  15:00h - 7:00h: call [phone] Telephone: [phone]

## 2017-09-25 NOTE — Patient Instructions (Signed)
ICD-10-CM   1. ILD (interstitial lung disease) ([place]) J84.9       - I am concerned you t have Interstitial Lung Disease (ILD)  -  There are > 100 varieties of this.   - your variety does NOT look like IPF which is most common and seen in men starting typically aftte 60  - tis might be amiodarone related  - To narrow down possibilities and assess severity please do the following tests  - do autoimmune panel: Serum: ESR, ANA, DS-DNA, RF, anti-CCP, ssA, ssB, scl-70, ANCA, MPO and PR-3 antibodies, Total CK,  Aldolase,  Hypersensitivity Pneumonitis Panel, RNP  - I will talk to Dr [name] regarding objectives of heart lung care & based on this will have to consider   - bronchoscopy with lavage to help narrow the diagnosis +/- empiric steroids for few months    - surgical lung biopsy can be risky  - But glad you are feeling less cough and better after stopping amiodarone  Followup  - will call back with results and post discussion with Dr [name]  - return in 1 month to ILD clinic

## 2017-09-26 LAB — TIQ-NTM

## 2017-09-27 ENCOUNTER — Other Ambulatory Visit (HOSPITAL_COMMUNITY): Payer: Self-pay

## 2017-09-27 NOTE — Progress Notes (Signed)
Paramedicine Encounter    Patient ID: Vincent Chandler, male    DOB: Nov 27, 1960, 57 y.o.   MRN: [id number]   Patient Care Team: [name], Alferd Apa, DO as PCP - General (Family Medicine) Burtis Junes, NP as Nurse Practitioner (Nurse Practitioner) Deboraha Sprang, MD as Consulting Physician (Cardiology) Allyn Kenner, MD (Dermatology)  Patient Active Problem List   Diagnosis Date Noted  . Retained dental root 08/30/2017  . Dental caries 08/30/2017  . Chronic periodontitis 08/30/2017  . Atrial flutter ([place]) 08/08/2017  . Dyspnea 08/07/2017  . Chronic systolic heart failure ([place]) 08/07/2017  . Hyperglycemia 08/07/2017  . Lower respiratory infection 08/07/2017  . Screening for colon cancer 08/07/2017  . CKD (chronic kidney disease) 2-3 07/19/2014  . NSTEMI (non-ST elevated myocardial infarction) ([place]) 07/18/2014  . OSA (obstructive sleep apnea) 07/18/2014  . Tachycardia   . V tach ([place]) 07/17/2014  . Hypoxemia 07/17/2014  . CHF (congestive heart failure) ([place]) 06/04/2012  . Sinus tachycardia 12/29/2010  . Coronary artery disease prior RCA stent with new inferior Q waves 10/18/2010  . Ischemic and nonischemic cardiomyopathy   10/18/2010  . Uncontrolled type 2 diabetes mellitus without complication, without long-term current use of insulin ([place]) 03/10/2010  . Hyperlipidemia 03/10/2010  . Essential hypertension 03/10/2010    Current Outpatient Medications:  .  ACCU-CHEK FASTCLIX LANCETS MISC, Use as directed once a day.  Dx code: E11.9, Disp: 100 each, Rfl: 1 .  amiodarone (PACERONE) 200 MG tablet, Take 200 mg by mouth daily., Disp: , Rfl:  .  apixaban (ELIQUIS) 5 MG TABS tablet, Take 1 tablet (5 mg total) by mouth 2 (two) times daily., Disp: 180 tablet, Rfl: 3 .  atorvastatin (LIPITOR) 40 MG tablet, Take 1 tablet (40 mg total) by mouth at bedtime., Disp: , Rfl:  .  blood glucose meter kit and supplies, Dispense based on patient and insurance preference. Use as directed once a day.  Dx Code E11.9., Disp: 1 each, Rfl: 0 .  Blood Glucose Monitoring Suppl (ACCU-CHEK GUIDE) w/Device KIT, 1 each by Does not apply route daily. DX Code: E11.9, Disp: 1 kit, Rfl: 0 .  carvedilol (COREG) 6.25 MG tablet, Take 1 tablet (6.25 mg total) by mouth 2 (two) times daily with a meal., Disp: 60 tablet, Rfl: 3 .  Digoxin 62.5 MCG TABS, Take 0.0625 mg by mouth daily. (Patient taking differently: Take 0.5 tablets by mouth daily. ), Disp: 30 tablet, Rfl: 3 .  glucose blood (ACCU-CHEK GUIDE) test strip, Use as directed once a day.  Dx code: E11.9, Disp: 100 each, Rfl: 1 .  guaiFENesin (MUCINEX) 600 MG 12 hr tablet, Take 1 tablet (600 mg total) by mouth 2 (two) times daily as needed for to loosen phlegm. (Patient not taking: Reported on 09/25/2017), Disp: , Rfl:  .  metFORMIN (GLUCOPHAGE) 500 MG tablet, Take 1 tablet (500 mg total) by mouth 2 (two) times daily with a meal., Disp: 60 tablet, Rfl: 1 .  mometasone-formoterol (DULERA) 200-5 MCG/ACT AERO, Inhale 2 puffs into the lungs 2 (two) times daily. Further refills per PCP, Disp: 1 Inhaler, Rfl: 0 .  nitroGLYCERIN (NITROSTAT) 0.4 MG SL tablet, Place 1 tablet (0.4 mg total) under the tongue every 5 (five) minutes as needed for chest pain. (Patient not taking: Reported on 09/25/2017), Disp: 25 tablet, Rfl: 3 .  sacubitril-valsartan (ENTRESTO) 49-51 MG, Take 1 tablet by mouth 2 (two) times daily., Disp: , Rfl:  .  spironolactone (ALDACTONE) 25 MG tablet, Take 0.5 tablets (12.5  mg total) by mouth daily., Disp: 15 tablet, Rfl: 3 No Known Allergies    Social History   Socioeconomic History  . Marital status: Divorced    Spouse name: n/a  . Number of children: 0  . Years of education: 12th grade  . Highest education level: Not on file  Occupational History  . Occupation: Data processing manager ---self employed    Employer: [employer]  Social Needs  . Financial resource strain: Not on file  . Food insecurity:    Worry: Not on  file    Inability: Not on file  . Transportation needs:    Medical: Not on file    Non-medical: Not on file  Tobacco Use  . Smoking status: Never Smoker  . Smokeless tobacco: Current User    Types: Chew  Substance and Sexual Activity  . Alcohol use: Yes    Alcohol/week: 3.6 oz    Types: 6 Shots of liquor per week  . Drug use: No  . Sexual activity: Yes    Partners: Female  Lifestyle  . Physical activity:    Days per week: Not on file    Minutes per session: Not on file  . Stress: Not on file  Relationships  . Social connections:    Talks on phone: Not on file    Gets together: Not on file    Attends religious service: Not on file    Active member of club or organization: Not on file    Attends meetings of clubs or organizations: Not on file    Relationship status: Not on file  . Intimate partner violence:    Fear of current or ex partner: Not on file    Emotionally abused: Not on file    Physically abused: Not on file    Forced sexual activity: Not on file  Other Topics Concern  . Not on file  Social History Narrative   Exercise-- walking    Lives alone.   Brother lives in [place], [place]    Physical Exam      Future Appointments  Date Time Provider [place]  10/01/2017  1:00 PM [facility] PAT 4 [facility] None  10/19/2017 11:40 AM Larey Dresser, MD [facility] None  10/25/2017  1:45 PM Deboraha Sprang, MD [facility]  10/30/2017  3:00 PM [facility] PFT ROOM [facility] None  10/30/2017  4:30 PM Brand Males, MD [facility] None  11/08/2017 11:15 AM Ann Held, DO [facility]  11/20/2017  2:00 PM Sueanne Margarita, MD [facility]  12/19/2017  9:40 AM [facility] DEVICE REMOTES [facility]    BP 134/68   Pulse 60   Resp 15   Wt 202 lb (91.6 kg)   SpO2 96%   BMI 28.98 kg/m  CBG EMS-120 Weight yesterday-202 Last visit weight-200  Pt reports he is feeling good, pt denies sob, no chest pains, no  palpitations. Pt has his heart cath next week and oral procedure also so I wont be able to do a home visit next week but will pick back up the following week. He has all meds, no missed doses. He went out of town over the past wknd to beach for car show and he reports there was a lot of walking around and he did well with that-no sob no issues noted. Pts lungs clear, no swelling noted. Diet is doing ok, he is eating foods with sodium but in small portions and not doing that daily.   Gradie Ohm  Donnal Debar, [facility] Paramedic  09/27/17

## 2017-09-28 NOTE — Pre-Procedure Instructions (Signed)
Treyton Slimp  09/28/2017      [place] [place] [address] [place]. [place]. [place] [address] Phone: [phone] Fax: [phone]    Your procedure is scheduled on Thurs., October 04, 2017  Report to [facility] Admitting Entrance "A" at 5:30AM  Call this number if you have problems the morning of surgery:  [phone]   Remember:  Do not eat food or drink liquids after midnight.  Take these medicines the morning of surgery with A SIP OF WATER: Carvedilol (COREG), Digoxin, and Mometasone-formoterol (DULERA).  Follow your doctor's instruction regarding Eliquis.  As of today, stop taking all Aspirins, Vitamins, Fish oils, and Herbal medications. Also stop all NSAIDS i.e. Advil, Ibuprofen, Motrin, Aleve, Anaprox, Naproxen, BC and Goody Powders.  How to Manage Your Diabetes Before and After Surgery  Why is it important to control my blood sugar before and after surgery? . Improving blood sugar levels before and after surgery helps healing and can limit problems. . A way of improving blood sugar control is eating a healthy diet by: o  Eating less sugar and carbohydrates o  Increasing activity/exercise o  Talking with your doctor about reaching your blood sugar goals . High blood sugars (greater than 180 mg/dL) can raise your risk of infections and slow your recovery, so you will need to focus on controlling your diabetes during the weeks before surgery. . Make sure that the doctor who takes care of your diabetes knows about your planned surgery including the date and location.  How do I manage my blood sugar before surgery? . Check your blood sugar at least 4 times a day, starting 2 days before surgery, to make sure that the level is not too high or low. o Check your blood sugar the morning of your surgery when you wake up and every 2 hours until you get to the Short Stay unit. . If your blood sugar is less than 70 mg/dL,  you will need to treat for low blood sugar: o Do not take insulin. o Treat a low blood sugar (less than 70 mg/dL) with  cup of clear juice (cranberry or apple), 4 glucose tablets, OR glucose gel. Recheck blood sugar in 15 minutes after treatment (to make sure it is greater than 70 mg/dL). If your blood sugar is not greater than 70 mg/dL on recheck, call [phone] o  for further instructions. . Report your blood sugar to the short stay nurse when you get to Short Stay.  . If you are admitted to the hospital after surgery: o Your blood sugar will be checked by the staff and you will probably be given insulin after surgery (instead of oral diabetes medicines) to make sure you have good blood sugar levels. o The goal for blood sugar control after surgery is 80-180 mg/dL.  WHAT DO I DO ABOUT MY DIABETES MEDICATION?  Marland Kitchen Do not take MetFORMIN (GLUCOPHAGE) the morning of surgery.  . If your CBG is greater than 220 mg/dL, call us at [phone]   Do not wear jewelry.  Do not wear lotions, powders, colognes, or deodorant.  Do not shave 48 hours prior to surgery.  Men may shave face the night before.  Do not bring valuables to the hospital.  [facility] is not responsible for any belongings or valuables.  Contacts, dentures or bridgework may not be worn into surgery.  Leave your suitcase in the car.  After surgery it may  be brought to your room.  For patients admitted to the hospital, discharge time will be determined by your treatment team.  Patients discharged the day of surgery will not be allowed to drive home.   Special instructions:  - Preparing For Surgery  Before surgery, you can play an important role. Because skin is not sterile, your skin needs to be as free of germs as possible. You can reduce the number of germs on your skin by washing with CHG (chlorahexidine gluconate) Soap before surgery.  CHG is an antiseptic cleaner which kills germs and bonds with the skin to  continue killing germs even after washing.  Please do not use if you have an allergy to CHG or antibacterial soaps. If your skin becomes reddened/irritated stop using the CHG.  Do not shave (including legs and underarms) for at least 48 hours prior to first CHG shower. It is OK to shave your face.  Please follow these instructions carefully.   1. Shower the NIGHT BEFORE SURGERY and the MORNING OF SURGERY with CHG.   2. If you chose to wash your hair, wash your hair first as usual with your normal shampoo.  3. After you shampoo, rinse your hair and body thoroughly to remove the shampoo.  4. Use CHG as you would any other liquid soap. You can apply CHG directly to the skin and wash gently with a scrungie or a clean washcloth.   5. Apply the CHG Soap to your body ONLY FROM THE NECK DOWN.  Do not use on open wounds or open sores. Avoid contact with your eyes, ears, mouth and genitals (private parts). Wash Face and genitals (private parts)  with your normal soap.  6. Wash thoroughly, paying special attention to the area where your surgery will be performed.  7. Thoroughly rinse your body with warm water from the neck down.  8. DO NOT shower/wash with your normal soap after using and rinsing off the CHG Soap.  9. Pat yourself dry with a CLEAN TOWEL.  10. Wear CLEAN PAJAMAS to bed the night before surgery, wear comfortable clothes the morning of surgery  11. Place CLEAN SHEETS on your bed the night of your first shower and DO NOT SLEEP WITH PETS.  Day of Surgery: Do not apply any deodorants/lotions. Please wear clean clothes to the hospital/surgery center.    Please read over the following fact sheets that you were given. Pain Booklet, Coughing and Deep Breathing and Surgical Site Infection Prevention

## 2017-09-30 LAB — ANA: ANA: NEGATIVE

## 2017-09-30 LAB — HYPERSENSITIVITY PNUEMONITIS PROFILE
ASPERGILLUS FUMIGATUS: NEGATIVE
Faenia retivirgula: NEGATIVE
Pigeon Serum: NEGATIVE
S. VIRIDIS: NEGATIVE
T. CANDIDUS: NEGATIVE
T. VULGARIS: NEGATIVE

## 2017-09-30 LAB — ANTI-SCLERODERMA ANTIBODY: Scleroderma (Scl-70) (ENA) Antibody, IgG: <1 AI

## 2017-09-30 LAB — CYCLIC CITRUL PEPTIDE ANTIBODY, IGG: Cyclic Citrullin Peptide Ab: <16 UNITS

## 2017-09-30 LAB — MPO/PR-3 (ANCA) ANTIBODIES
Myeloperoxidase Abs: <1 AI
SERINE PROTEASE 3: 2.9 AI — AB

## 2017-09-30 LAB — ANCA SCREEN W REFLEX TITER: ANCA Screen: NEGATIVE

## 2017-09-30 LAB — SJOGREN'S SYNDROME ANTIBODS(SSA + SSB)
SSA (Ro) (ENA) Antibody, IgG: <1 AI
SSB (La) (ENA) Antibody, IgG: <1 AI

## 2017-09-30 LAB — RHEUMATOID FACTOR: Rhuematoid fact SerPl-aCnc: <14 IU/mL (ref <14)

## 2017-09-30 LAB — ALDOLASE: ALDOLASE: 5.2 U/L (ref <=8.1)

## 2017-09-30 LAB — ANTI-DNA ANTIBODY, DOUBLE-STRANDED: DS DNA AB: 5 [IU]/mL — AB

## 2017-10-01 ENCOUNTER — Encounter (HOSPITAL_COMMUNITY)
Admission: RE | Admit: 2017-10-01 | Discharge: 2017-10-01 | Disposition: A | Discharge status: Home / Self Care | Payer: Medicare HMO | Source: Ambulatory Visit | Attending: Dentistry | Admitting: Dentistry

## 2017-10-01 ENCOUNTER — Encounter (HOSPITAL_COMMUNITY): Payer: Self-pay

## 2017-10-01 ENCOUNTER — Other Ambulatory Visit: Payer: Self-pay

## 2017-10-01 DIAGNOSIS — E1122 Type 2 diabetes mellitus with diabetic chronic kidney disease: Secondary | ICD-10-CM | POA: Diagnosis not present

## 2017-10-01 DIAGNOSIS — I255 Ischemic cardiomyopathy: Secondary | ICD-10-CM | POA: Diagnosis not present

## 2017-10-01 DIAGNOSIS — G4733 Obstructive sleep apnea (adult) (pediatric): Secondary | ICD-10-CM | POA: Diagnosis not present

## 2017-10-01 DIAGNOSIS — J849 Interstitial pulmonary disease, unspecified: Secondary | ICD-10-CM | POA: Diagnosis not present

## 2017-10-01 DIAGNOSIS — I480 Paroxysmal atrial fibrillation: Secondary | ICD-10-CM | POA: Diagnosis not present

## 2017-10-01 DIAGNOSIS — M549 Dorsalgia, unspecified: Secondary | ICD-10-CM | POA: Diagnosis not present

## 2017-10-01 DIAGNOSIS — N183 Chronic kidney disease, stage 3 (moderate): Secondary | ICD-10-CM | POA: Diagnosis not present

## 2017-10-01 DIAGNOSIS — I2721 Secondary pulmonary arterial hypertension: Secondary | ICD-10-CM | POA: Diagnosis not present

## 2017-10-01 DIAGNOSIS — J45909 Unspecified asthma, uncomplicated: Secondary | ICD-10-CM | POA: Diagnosis not present

## 2017-10-01 DIAGNOSIS — I447 Left bundle-branch block, unspecified: Secondary | ICD-10-CM | POA: Diagnosis not present

## 2017-10-01 DIAGNOSIS — I5023 Acute on chronic systolic (congestive) heart failure: Secondary | ICD-10-CM | POA: Diagnosis present

## 2017-10-01 DIAGNOSIS — I5022 Chronic systolic (congestive) heart failure: Secondary | ICD-10-CM | POA: Diagnosis not present

## 2017-10-01 DIAGNOSIS — K0530 Chronic periodontitis, unspecified: Secondary | ICD-10-CM | POA: Diagnosis not present

## 2017-10-01 DIAGNOSIS — I130 Hypertensive heart and chronic kidney disease with heart failure and stage 1 through stage 4 chronic kidney disease, or unspecified chronic kidney disease: Secondary | ICD-10-CM | POA: Diagnosis not present

## 2017-10-01 DIAGNOSIS — Z951 Presence of aortocoronary bypass graft: Secondary | ICD-10-CM | POA: Diagnosis not present

## 2017-10-01 DIAGNOSIS — Z7984 Long term (current) use of oral hypoglycemic drugs: Secondary | ICD-10-CM | POA: Diagnosis not present

## 2017-10-01 DIAGNOSIS — Z95810 Presence of automatic (implantable) cardiac defibrillator: Secondary | ICD-10-CM | POA: Diagnosis not present

## 2017-10-01 DIAGNOSIS — Z7901 Long term (current) use of anticoagulants: Secondary | ICD-10-CM | POA: Diagnosis not present

## 2017-10-01 DIAGNOSIS — I2510 Atherosclerotic heart disease of native coronary artery without angina pectoris: Secondary | ICD-10-CM | POA: Diagnosis not present

## 2017-10-01 DIAGNOSIS — F419 Anxiety disorder, unspecified: Secondary | ICD-10-CM | POA: Diagnosis not present

## 2017-10-01 DIAGNOSIS — G8929 Other chronic pain: Secondary | ICD-10-CM | POA: Diagnosis not present

## 2017-10-01 DIAGNOSIS — K029 Dental caries, unspecified: Secondary | ICD-10-CM | POA: Diagnosis not present

## 2017-10-01 DIAGNOSIS — E785 Hyperlipidemia, unspecified: Secondary | ICD-10-CM | POA: Diagnosis not present

## 2017-10-01 DIAGNOSIS — K219 Gastro-esophageal reflux disease without esophagitis: Secondary | ICD-10-CM | POA: Diagnosis not present

## 2017-10-01 HISTORY — DX: Presence of automatic (implantable) cardiac defibrillator: Z95.810

## 2017-10-01 LAB — CBC
HCT: 40.3 % (ref 39.0–52.0)
Hemoglobin: 13 g/dL (ref 13.0–17.0)
MCH: 35.3 pg — ABNORMAL HIGH (ref 26.0–34.0)
MCHC: 32.3 g/dL (ref 30.0–36.0)
MCV: 109.5 fL — ABNORMAL HIGH (ref 78.0–100.0)
PLATELETS: 163 10*3/uL (ref 150–400)
RBC: 3.68 MIL/uL — AB (ref 4.22–5.81)
RDW: 14.9 % (ref 11.5–15.5)
WBC: 5.3 10*3/uL (ref 4.0–10.5)

## 2017-10-01 LAB — BASIC METABOLIC PANEL
ANION GAP: 10 (ref 5–15)
BUN: 22 mg/dL — ABNORMAL HIGH (ref 6–20)
CALCIUM: 9.4 mg/dL (ref 8.9–10.3)
CO2: 21 mmol/L — ABNORMAL LOW (ref 22–32)
Chloride: 108 mmol/L (ref 101–111)
Creatinine, Ser: 1.31 mg/dL — ABNORMAL HIGH (ref 0.61–1.24)
GFR, EST NON AFRICAN AMERICAN: 59 mL/min — AB (ref >60)
Glucose, Bld: 125 mg/dL — ABNORMAL HIGH (ref 65–99)
Potassium: 5.1 mmol/L (ref 3.5–5.1)
Sodium: 139 mmol/L (ref 135–145)

## 2017-10-01 LAB — GLUCOSE, CAPILLARY: GLUCOSE-CAPILLARY: 124 mg/dL — AB (ref 65–99)

## 2017-10-01 NOTE — Progress Notes (Signed)
Anesthesia Chart Review:  Pt is a 57 year old male scheduled for multiple extraction with alveoloplasty and gross debridement of teeth on 10/04/2017 with Teena Dunk, DDS  Pt is being considered for LVAD  - PCP is Roma Schanz, MD - Pulmonologist is Brand Males, MD for possible ILD - HF cardiologist is Loralie Champagne, MD - CT surgeon is Ivin Poot, MD who feels pt is high risk for implantable VAD  PMH includes: CAD (S/p overlapping DES to CX 2016; s/p CABG 2014; DES to RCA 2004), atrial flutter (s/p ablation 09/12/17), cardiomyopathy (related to alcohol use), CHF, AICD [employer]), HTN, DM, hyperlipidemia, OSA, asthma, GERD. Never smoker. BMI 28.5  Medications include: Amiodarone (discontinued after ablation 09/12/17), Eliquis, Lipitor, carvedilol, digoxin, metformin, Dulera, Entresto, spironolactone. Eliquis on hold for procedure.   BP (!) 109/55   Pulse 64   Temp 36.7 C   Resp 18   Ht _0  (1.778 m)   Wt 199 lb 1.6 oz (90.3 kg)   SpO2 95%   BMI 28.57 kg/m   Preoperative labs 10/01/17 reviewed.   - PT 13.7 on 10/03/17  CXR 08/07/17:  - Mild bilateral airspace and interstitial opacities-question pneumonia versus edema. - Cardiomegaly.  EKG 09/10/17: Sinus rhythm with 1st degree A-V block. Left axis deviation. LBBB.   R cardiac cath 10/03/17:  1. Good cardiac output.  2. Normal filling pressures.  3. Moderate pulmonary arterial hypertension though PVR not markedly elevated.  I am concerned that this is group 3 PH due to interstitial lung disease.   CT chest high resolution 09/18/17:  1. The appearance of the lungs is compatible with interstitial lung disease. The pattern is considered a probable usual interstitial pneumonia (UIP) CT pattern, however, at this time, no definitive honeycombing is identified. Outpatient referral to Pulmonology for further evaluation is suggested in the near future if not already obtained. Repeat high-resolution chest CT is  recommended in 12 months to assess for temporal changes in the appearance of the lung parenchyma. 2. Aortic atherosclerosis, in addition to 3 vessel coronary artery disease. Patient is status post median sternotomy for CABG including LIMA to the LAD. 3. Additional incidental findings, as above.  CPX 08/24/17:  - Exercise testing with gas exchange demonstrates severe functional limitation when compared to matched sedentary norms. Patient is severely HF limited with markedly elevated VE/VCO2 slope, and poor BP response to exercise predicting poor short-term prognosis. Note desaturations at/below 90% at 4 minutes into exercise (workload) with simultaneous lightheadedness (3/10) which progressed with further desaturations. There was also significant chronotropic incompetence.  Cardiac cath 07/21/14:  - LM: Minimal disease - LAD: 80% mid LAD at D2.  D2 with 80% ostial stenosis.  Large D1 with long 80% proximal stenosis.  Patent LIMA-LAD.  Patent SVG-D1 - CX: 80% proximal stenosis proximal to the takeoff of large PLOM.  99% stenosed area and proximal PLOM.  Total occlusion of SVG-PLOM. S/p overlapping DES to mid CX - RCA: Total occlusion of proximal RCA.  Left to right collaterals appeared to primarily come from LAD system.  Total occlusion of SVG-RCA system - Final Conclusions:  2/4 patent grafts with SVG-PLOM and SVG-RCA system totally occluded.  There are collaterals from the LAD system to the RCA and the RCA is totally occluded proximally.  There are no collaterals to the LCx territory. S/p overlapping DES to mid CX  If no changes, I anticipate pt can proceed with surgery as scheduled.   [name], FNP-BC [facility] Short [facility]  [facility]urgical [facility]/Anesthesiology Phone: [phone] 10/03/2017 3:06 PM

## 2017-10-01 NOTE — Progress Notes (Signed)
CALLED ANGELA KABBE WHO CAME AND SPOKE WITH PATIENT FOR ANESTHESIA CONSULT.

## 2017-10-02 ENCOUNTER — Encounter (HOSPITAL_COMMUNITY): Payer: Medicare Other

## 2017-10-03 ENCOUNTER — Other Ambulatory Visit: Payer: Self-pay

## 2017-10-03 ENCOUNTER — Inpatient Hospital Stay (HOSPITAL_COMMUNITY): Admission: RE | Admit: 2017-10-03 | Payer: Medicare Other | Source: Ambulatory Visit | Admitting: Dentistry

## 2017-10-03 ENCOUNTER — Observation Stay (HOSPITAL_COMMUNITY)
Admission: RE | Admit: 2017-10-03 | Discharge: 2017-10-04 | Disposition: A | Discharge status: Home / Self Care | Payer: Medicare HMO | Source: Ambulatory Visit | Attending: Cardiology | Admitting: Cardiology

## 2017-10-03 ENCOUNTER — Encounter (HOSPITAL_COMMUNITY)
Admission: RE | Disposition: A | Discharge status: Home / Self Care | Payer: Self-pay | Source: Ambulatory Visit | Attending: Cardiology

## 2017-10-03 ENCOUNTER — Encounter (HOSPITAL_COMMUNITY): Payer: Self-pay | Admitting: General Practice

## 2017-10-03 DIAGNOSIS — I255 Ischemic cardiomyopathy: Secondary | ICD-10-CM | POA: Insufficient documentation

## 2017-10-03 DIAGNOSIS — Z951 Presence of aortocoronary bypass graft: Secondary | ICD-10-CM | POA: Insufficient documentation

## 2017-10-03 DIAGNOSIS — E785 Hyperlipidemia, unspecified: Secondary | ICD-10-CM | POA: Insufficient documentation

## 2017-10-03 DIAGNOSIS — I272 Pulmonary hypertension, unspecified: Secondary | ICD-10-CM

## 2017-10-03 DIAGNOSIS — N183 Chronic kidney disease, stage 3 (moderate): Secondary | ICD-10-CM | POA: Insufficient documentation

## 2017-10-03 DIAGNOSIS — E1122 Type 2 diabetes mellitus with diabetic chronic kidney disease: Secondary | ICD-10-CM | POA: Insufficient documentation

## 2017-10-03 DIAGNOSIS — I5022 Chronic systolic (congestive) heart failure: Secondary | ICD-10-CM | POA: Insufficient documentation

## 2017-10-03 DIAGNOSIS — I509 Heart failure, unspecified: Secondary | ICD-10-CM | POA: Diagnosis not present

## 2017-10-03 DIAGNOSIS — Z7901 Long term (current) use of anticoagulants: Secondary | ICD-10-CM | POA: Insufficient documentation

## 2017-10-03 DIAGNOSIS — K029 Dental caries, unspecified: Secondary | ICD-10-CM | POA: Insufficient documentation

## 2017-10-03 DIAGNOSIS — K0530 Chronic periodontitis, unspecified: Secondary | ICD-10-CM | POA: Insufficient documentation

## 2017-10-03 DIAGNOSIS — I5023 Acute on chronic systolic (congestive) heart failure: Secondary | ICD-10-CM | POA: Diagnosis not present

## 2017-10-03 DIAGNOSIS — F419 Anxiety disorder, unspecified: Secondary | ICD-10-CM | POA: Insufficient documentation

## 2017-10-03 DIAGNOSIS — I2721 Secondary pulmonary arterial hypertension: Principal | ICD-10-CM | POA: Insufficient documentation

## 2017-10-03 DIAGNOSIS — I480 Paroxysmal atrial fibrillation: Secondary | ICD-10-CM | POA: Insufficient documentation

## 2017-10-03 DIAGNOSIS — J45909 Unspecified asthma, uncomplicated: Secondary | ICD-10-CM | POA: Insufficient documentation

## 2017-10-03 DIAGNOSIS — I447 Left bundle-branch block, unspecified: Secondary | ICD-10-CM | POA: Insufficient documentation

## 2017-10-03 DIAGNOSIS — M549 Dorsalgia, unspecified: Secondary | ICD-10-CM | POA: Insufficient documentation

## 2017-10-03 DIAGNOSIS — G4733 Obstructive sleep apnea (adult) (pediatric): Secondary | ICD-10-CM | POA: Insufficient documentation

## 2017-10-03 DIAGNOSIS — J849 Interstitial pulmonary disease, unspecified: Secondary | ICD-10-CM | POA: Insufficient documentation

## 2017-10-03 DIAGNOSIS — Z95810 Presence of automatic (implantable) cardiac defibrillator: Secondary | ICD-10-CM | POA: Insufficient documentation

## 2017-10-03 DIAGNOSIS — Z7984 Long term (current) use of oral hypoglycemic drugs: Secondary | ICD-10-CM | POA: Insufficient documentation

## 2017-10-03 DIAGNOSIS — G8929 Other chronic pain: Secondary | ICD-10-CM | POA: Insufficient documentation

## 2017-10-03 DIAGNOSIS — I130 Hypertensive heart and chronic kidney disease with heart failure and stage 1 through stage 4 chronic kidney disease, or unspecified chronic kidney disease: Secondary | ICD-10-CM | POA: Insufficient documentation

## 2017-10-03 DIAGNOSIS — K219 Gastro-esophageal reflux disease without esophagitis: Secondary | ICD-10-CM | POA: Insufficient documentation

## 2017-10-03 DIAGNOSIS — I2510 Atherosclerotic heart disease of native coronary artery without angina pectoris: Secondary | ICD-10-CM | POA: Insufficient documentation

## 2017-10-03 HISTORY — PX: RIGHT HEART CATH: CATH118263

## 2017-10-03 LAB — POCT I-STAT 3, VENOUS BLOOD GAS (G3P V)
ACID-BASE DEFICIT: 4 mmol/L — AB (ref 0.0–2.0)
ACID-BASE DEFICIT: 4 mmol/L — AB (ref 0.0–2.0)
Acid-base deficit: 4 mmol/L — ABNORMAL HIGH (ref 0.0–2.0)
Bicarbonate: 21.5 mmol/L (ref 20.0–28.0)
Bicarbonate: 22.4 mmol/L (ref 20.0–28.0)
Bicarbonate: 22.6 mmol/L (ref 20.0–28.0)
O2 SAT: 65 %
O2 SAT: 67 %
O2 Saturation: 59 %
PCO2 VEN: 43.2 mmHg — AB (ref 44.0–60.0)
PH VEN: 7.315 (ref 7.250–7.430)
TCO2: 24 mmol/L (ref 22–32)
TCO2: 23 mmol/L (ref 22–32)
TCO2: 24 mmol/L (ref 22–32)
pCO2, Ven: 42.1 mmHg — ABNORMAL LOW (ref 44.0–60.0)
pCO2, Ven: 44.2 mmHg (ref 44.0–60.0)
pH, Ven: 7.317 (ref 7.250–7.430)
pH, Ven: 7.322 (ref 7.250–7.430)
pO2, Ven: 34 mmHg (ref 32.0–45.0)
pO2, Ven: 37 mmHg (ref 32.0–45.0)
pO2, Ven: 37 mmHg (ref 32.0–45.0)

## 2017-10-03 LAB — GLUCOSE, CAPILLARY
GLUCOSE-CAPILLARY: 92 mg/dL (ref 65–99)
GLUCOSE-CAPILLARY: 109 mg/dL — AB (ref 65–99)
GLUCOSE-CAPILLARY: 108 mg/dL — AB (ref 65–99)
Glucose-Capillary: 129 mg/dL — ABNORMAL HIGH (ref 65–99)

## 2017-10-03 LAB — PROTIME-INR
INR: 1.06
Prothrombin Time: 13.7 seconds (ref 11.4–15.2)

## 2017-10-03 SURGERY — RIGHT HEART CATH
Anesthesia: LOCAL

## 2017-10-03 MED ORDER — SODIUM CHLORIDE 0.9% FLUSH
3.0000 mL | Freq: Two times a day (BID) | INTRAVENOUS | Status: DC
  Administered 2017-10-03 – 2017-10-04 (×2): 3 mL via INTRAVENOUS

## 2017-10-03 MED ORDER — ASPIRIN 81 MG PO CHEW
81.0000 mg | CHEWABLE_TABLET | ORAL | Status: AC
  Administered 2017-10-03: 81 mg via ORAL

## 2017-10-03 MED ORDER — SODIUM CHLORIDE 0.9 % IV SOLN: 250.0000 mL | INTRAVENOUS | Status: DC | PRN

## 2017-10-03 MED ORDER — ACETAMINOPHEN 325 MG PO TABS: 650.0000 mg | ORAL_TABLET | ORAL | Status: DC | PRN

## 2017-10-03 MED ORDER — SODIUM CHLORIDE 0.9% FLUSH: 3.0000 mL | Freq: Two times a day (BID) | INTRAVENOUS | Status: DC

## 2017-10-03 MED ORDER — LIDOCAINE HCL (PF) 1 % IJ SOLN
INTRAMUSCULAR | Status: DC | PRN
  Administered 2017-10-03: 1 mL

## 2017-10-03 MED ORDER — METFORMIN HCL 500 MG PO TABS
500.0000 mg | ORAL_TABLET | Freq: Two times a day (BID) | ORAL | Status: DC
  Administered 2017-10-03 – 2017-10-04 (×2): 500 mg via ORAL
  Filled 2017-10-03 (×2): qty 1

## 2017-10-03 MED ORDER — ASPIRIN 81 MG PO CHEW
CHEWABLE_TABLET | ORAL | Status: AC
  Administered 2017-10-03: 81 mg via ORAL
  Filled 2017-10-03: qty 1

## 2017-10-03 MED ORDER — SACUBITRIL-VALSARTAN 49-51 MG PO TABS
1.0000 | ORAL_TABLET | Freq: Two times a day (BID) | ORAL | Status: DC
  Administered 2017-10-03 – 2017-10-04 (×2): 1 via ORAL
  Filled 2017-10-03 (×2): qty 1

## 2017-10-03 MED ORDER — SODIUM CHLORIDE 0.9 % IV SOLN
INTRAVENOUS | Status: DC
  Administered 2017-10-03: 09:00:00 via INTRAVENOUS

## 2017-10-03 MED ORDER — SODIUM CHLORIDE 0.9% FLUSH: 3.0000 mL | INTRAVENOUS | Status: DC | PRN

## 2017-10-03 MED ORDER — LIDOCAINE HCL (PF) 1 % IJ SOLN
INTRAMUSCULAR | Status: AC
  Filled 2017-10-03: qty 30

## 2017-10-03 MED ORDER — CEFAZOLIN SODIUM-DEXTROSE 2-4 GM/100ML-% IV SOLN
2.0000 g | Freq: Once | INTRAVENOUS | Status: AC
  Administered 2017-10-04: 2 g via INTRAVENOUS
  Filled 2017-10-03: qty 100

## 2017-10-03 MED ORDER — MOMETASONE FURO-FORMOTEROL FUM 200-5 MCG/ACT IN AERO
2.0000 | INHALATION_SPRAY | Freq: Two times a day (BID) | RESPIRATORY_TRACT | Status: DC
  Administered 2017-10-03 – 2017-10-04 (×2): 2 via RESPIRATORY_TRACT
  Filled 2017-10-03: qty 8.8

## 2017-10-03 MED ORDER — SPIRONOLACTONE 12.5 MG HALF TABLET
12.5000 mg | ORAL_TABLET | Freq: Every day | ORAL | Status: DC
  Administered 2017-10-04: 12.5 mg via ORAL
  Filled 2017-10-03: qty 1

## 2017-10-03 MED ORDER — ONDANSETRON HCL 4 MG/2ML IJ SOLN: 4.0000 mg | Freq: Four times a day (QID) | INTRAMUSCULAR | Status: DC | PRN

## 2017-10-03 MED ORDER — HEPARIN (PORCINE) IN NACL 2-0.9 UNIT/ML-% IJ SOLN
INTRAMUSCULAR | Status: AC | PRN
  Administered 2017-10-03: 500 mL

## 2017-10-03 MED ORDER — ATORVASTATIN CALCIUM 40 MG PO TABS
40.0000 mg | ORAL_TABLET | Freq: Every day | ORAL | Status: DC
  Administered 2017-10-03: 40 mg via ORAL
  Filled 2017-10-03: qty 1

## 2017-10-03 MED ORDER — CARVEDILOL 6.25 MG PO TABS
6.2500 mg | ORAL_TABLET | Freq: Two times a day (BID) | ORAL | Status: DC
  Administered 2017-10-03 – 2017-10-04 (×2): 6.25 mg via ORAL
  Filled 2017-10-03 (×2): qty 1

## 2017-10-03 MED ORDER — GUAIFENESIN ER 600 MG PO TB12: 600.0000 mg | ORAL_TABLET | Freq: Two times a day (BID) | ORAL | Status: DC | PRN

## 2017-10-03 MED ORDER — HEPARIN (PORCINE) IN NACL 2-0.9 UNIT/ML-% IJ SOLN
INTRAMUSCULAR | Status: AC
  Filled 2017-10-03: qty 500

## 2017-10-03 MED ORDER — DIGOXIN 0.0625 MG HALF TABLET
0.0625 mg | ORAL_TABLET | Freq: Every day | ORAL | Status: DC
  Administered 2017-10-04: 0.0625 mg via ORAL
  Filled 2017-10-03: qty 1

## 2017-10-03 SURGICAL SUPPLY — 7 items
CATH BALLN WEDGE 5F 110CM (CATHETERS) ×1 IMPLANT
PACK CARDIAC CATHETERIZATION (CUSTOM PROCEDURE TRAY) ×2 IMPLANT
SHEATH RAIN 4/5FR (SHEATH) ×1 IMPLANT
TRANSDUCER W/STOPCOCK (MISCELLANEOUS) ×2 IMPLANT
TUBING ART PRESS 72  MALE/FEM (TUBING) ×1
TUBING ART PRESS 72 MALE/FEM (TUBING) IMPLANT
TUBING CIL FLEX 10 FLL-RA (TUBING) ×2 IMPLANT

## 2017-10-03 NOTE — Interval H&P Note (Signed)
History and Physical Interval Note:  10/03/2017 10:40 AM  Vincent Chandler  has presented today for surgery, with the diagnosis of chf  The various methods of treatment have been discussed with the patient and family. After consideration of risks, benefits and other options for treatment, the patient has consented to  Procedure(s): RIGHT HEART CATH (N/A) as a surgical intervention .  The patient's history has been reviewed, patient examined, no change in status, stable for surgery.  I have reviewed the patient's chart and labs.  Questions were answered to the patient's satisfaction.     Lucinda Spells [employer]

## 2017-10-03 NOTE — H&P (Addendum)
Advanced Heart Failure Team History and Physical Note   PCP:  Ann Held, DO  PCP-Cardiology: Dr [name]  EP: Dr Caryl Comes     Reason for Admission: Heart Failure    HPI:   Mr Perales ins a 57 year old with history of A flutter, chronic systolic heart failure, CAD s/p CABG, CKD Stage III, DMII, and OSA. Has A flutter ablation 09/12/2017.   Admitted 08/07/17 with atrial flutter/RVR and acute on chronic systolic heart failure. Underwent successful DC/CV on 2/8. Required short term milrinone but was able to wean off. HF meds started. Echo this admission with EF 15%. D/C weight 201 pounds.  CPX in 2/19 showed severe functional impairment due to heart failure. However, PFTs were restrictive and high resolution CT chest was concerning for interstitial lung disease. Amiodarone was stopped.   Today he present for RHC and dental extractions. Ongoing mild dyspnea with exertion. No bleeding problems. Denies chest pain.  He has been taking all medications.   PMH:  1. Atrial fibrillation: Noted post-op CABG.  2. Atrial flutter: Noted during 2/19 admission, DCCV done.  S/p atrial flutter ablation in 3/19.  3. CAD: s/p CABG.  - LHC (1/16):  Patent LIMA-LAD and SVG-D. 80% mLCx and 99% PLOM, total occlusion of SVG-PLOM.  Total occlusion of RCA with occlusion of SVG-RCA.  Patient had DES to mLCx-PLOM.  4. HTN 5. Type II diabetes  6. Hyperlipidemia 7. Chronic systolic CHF: Ischemic cardiomyopathy.   - Echo (2/19): EF 15%, mildly dilated LV, mild LVH, inferolateral akinesis, milr MR, mildly dilated RV with severely reduced systolic function.  - CPX (2/19): Peak VO2 12.2, VE/VCO2 slope 53, RER 1.07 => severe limitation from heart failure.  [employer] ICD  8. Interstitial lung disease: PFTs (2/19) were restrictive, raising concern for ILD.  - High resolution CT chest in 2/19: interstitial lung disease concerning for usual interstitial pneumonitis. - Amiodarone stopped, ?amiodarone  lung toxicity.  9. CKD stage 3 10. OSA 11. ABIs (2/19): not significantly abnormal 12. Carotid dopplers (2/19): Mild disease only.   Review of Systems: [y] = yes, [ ] = no   General: Weight gain [ ]; Weight loss [ ]; Anorexia [ ]; Fatigue [ ]; Fever [ ]; Chills [ ]; Weakness [ ]  Cardiac: Chest pain/pressure [ ]; Resting SOB [ ]; Exertional SOB [ ]; Orthopnea [ ]; Pedal Edema [ ]; Palpitations [ ]; Syncope [ ]; Presyncope [ ]; Paroxysmal nocturnal dyspnea[ ]  Pulmonary: Cough [ ]; Wheezing[ ]; Hemoptysis[ ]; Sputum [ ]; Snoring [ ]  GI: Vomiting[ ]; Dysphagia[ ]; Melena[ ]; Hematochezia [ ]; Heartburn[ ]; Abdominal pain [ ]; Constipation [ ]; Diarrhea [ ]; BRBPR [ ]  GU: Hematuria[ ]; Dysuria [ ]; Nocturia[ ]  Vascular: Pain in legs with walking [ ]; Pain in feet with lying flat [ ]; Non-healing sores [ ]; Stroke [ ]; TIA [ ]; Slurred speech [ ];  Neuro: Headaches[ ]; Vertigo[ ]; Seizures[ ]; Paresthesias[ ];Blurred vision [ ]; Diplopia [ ]; Vision changes [ ]  Ortho/Skin: Arthritis [ ]; Joint pain [ ]; Muscle pain [ ]; Joint swelling [ ]; Back Pain [ ]; Rash [ ]  Psych: Depression[ ]; Anxiety[ ]  Heme: Bleeding problems [ ]; Clotting disorders [ ]; Anemia [ ]  Endocrine: Diabetes [ ]; Thyroid dysfunction[ ]   Home Medications Prior to Admission medications   Medication Sig Start Date End Date Taking? Authorizing Provider  amiodarone (PACERONE) 200 MG tablet Take 200 mg by mouth daily.   Yes [provider]  apixaban (ELIQUIS) 5 MG TABS tablet Take 1 tablet (5 mg total) by mouth 2 (two) times daily. 08/28/17  Yes Larey Dresser, MD  atorvastatin (LIPITOR) 40 MG tablet Take 1 tablet (40 mg total) by mouth at bedtime. 08/17/17  Yes Shirley Friar, PA-C  carvedilol (COREG) 6.25 MG tablet Take 1 tablet (6.25 mg total) by mouth 2 (two) times daily with a meal. 09/13/17  Yes Larey Dresser, MD  Digoxin 62.5 MCG TABS Take 0.0625 mg by mouth daily. Patient taking differently:  Take 0.5 tablets by mouth daily.  09/11/17  Yes Larey Dresser, MD  metFORMIN (GLUCOPHAGE) 500 MG tablet Take 1 tablet (500 mg total) by mouth 2 (two) times daily with a meal. 09/13/17  Yes Lowne Chase, Yvonne R, DO  mometasone-formoterol (DULERA) 200-5 MCG/ACT AERO Inhale 2 puffs into the lungs 2 (two) times daily. Further refills per PCP 08/17/17  Yes Shirley Friar, PA-C  sacubitril-valsartan (ENTRESTO) 49-51 MG Take 1 tablet by mouth 2 (two) times daily.   Yes [provider]  spironolactone (ALDACTONE) 25 MG tablet Take 0.5 tablets (12.5 mg total) by mouth daily. 09/13/17  Yes Larey Dresser, MD  ACCU-CHEK FASTCLIX LANCETS MISC Use as directed once a day.  Dx code: E11.9 09/19/17   Ann Held, DO  blood glucose meter kit and supplies Dispense based on patient and insurance preference. Use as directed once a day. Dx Code E11.9. 09/06/17   [name], Alferd Apa, DO  Blood Glucose Monitoring Suppl (ACCU-CHEK GUIDE) w/Device KIT 1 each by Does not apply route daily. DX Code: E11.9 09/19/17   Roma Schanz R, DO  glucose blood (ACCU-CHEK GUIDE) test strip Use as directed once a day.  Dx code: E11.9 09/19/17   [name], Alferd Apa, DO  guaiFENesin (MUCINEX) 600 MG 12 hr tablet Take 1 tablet (600 mg total) by mouth 2 (two) times daily as needed for to loosen phlegm. Patient not taking: Reported on 09/25/2017 08/17/17   Shirley Friar, PA-C  nitroGLYCERIN (NITROSTAT) 0.4 MG SL tablet Place 1 tablet (0.4 mg total) under the tongue every 5 (five) minutes as needed for chest pain. Patient not taking: Reported on 09/25/2017 07/14/15   Burtis Junes, NP    Past Medical History: Past Medical History:  Diagnosis Date  . AICD (automatic cardioverter/defibrillator) present   . Anginal pain ([place])   . Anxiety   . Asthma   . Atrial fibrillation-postoperative 11/28/2012  . Cardiomyopathy    alcohol use related  . CHF (congestive heart failure) ([place])   . Chronic back  pain   . Coronary artery disease    drug eluting stent RCA 2005-EF 30%- s/p CABG x 4; 2/4 patent grafts with SVG-PLOM and SVG-RCA system totally occluded. There are collaterals from the LAD system to the RCA and the RCA is totally occluded proximally. There are no collaterals to the LCx territory. He then underwent successful PCI of the mid left circumflex artery with overlapping Synergy drug-eluting stents  . Depression   . Diabetes mellitus type II dx'd in the 1990's  . GERD (gastroesophageal reflux disease)    yrs ago  . H/O hiatal hernia   . History of hypogonadism   . History of kidney stones   . Hyperlipidemia   . Hypertension   . Migraines    "used to"  . OSA on  CPAP    "mask is broken; working on getting a new one" (01/15/2015)  . Pneumonia 3-4 times  . Urinary incontinence     Past Surgical History: Past Surgical History:  Procedure Laterality Date  . A-FLUTTER ABLATION N/A 09/12/2017   Procedure: A-FLUTTER ABLATION;  Surgeon: Deboraha Sprang, MD;  Location: [facility];  Service: Cardiovascular;  Laterality: N/A;  . CARDIAC DEFIBRILLATOR PLACEMENT  01/15/2015  . CARDIOVERSION N/A 08/10/2017   Procedure: CARDIOVERSION;  Surgeon: Larey Dresser, MD;  Location: [facility] ENDOSCOPY;  Service: Cardiovascular;  Laterality: N/A;  . CORONARY ANGIOPLASTY WITH STENT PLACEMENT  ~ 2003  . CORONARY ARTERY BYPASS GRAFT N/A 08/15/2012   Procedure: CORONARY ARTERY BYPASS GRAFTING (CABG);  Surgeon: Ivin Poot, MD;  Location: [place] [facility];  Service: Open Heart Surgery;  Laterality: N/A;  Coronary Artery Bypass Grafting Times Four Using Left Internal Mammary Artery and Right Saphenous Leg Vein Harvested Endoscopically  . EP IMPLANTABLE DEVICE N/A 01/15/2015   Procedure: ICD Implant;  Surgeon: Deboraha Sprang, MD;  Location: [place] [facility];  Service: Cardiovascular;  Laterality: N/A;  . LEFT HEART CATHETERIZATION WITH CORONARY ANGIOGRAM N/A 08/08/2012   Procedure: LEFT HEART CATHETERIZATION WITH  CORONARY ANGIOGRAM;  Surgeon: Peter M Martinique, MD;  Location: [facility] CATH LAB;  Service: Cardiovascular;  Laterality: N/A;  . LEFT HEART CATHETERIZATION WITH CORONARY/GRAFT ANGIOGRAM N/A 07/21/2014   Procedure: LEFT HEART CATHETERIZATION WITH Beatrix Fetters;  Surgeon: Larey Dresser, MD;  Location: [facility] CATH LAB;  Service: Cardiovascular;  Laterality: N/A;  . PERCUTANEOUS CORONARY STENT INTERVENTION (PCI-S)  07/21/2014   Procedure: PERCUTANEOUS CORONARY STENT INTERVENTION (PCI-S);  Surgeon: Larey Dresser, MD;  Location: [facility] CATH LAB;  Service: Cardiovascular;;  . REFRACTIVE SURGERY Bilateral 1990's  . TEE WITHOUT CARDIOVERSION N/A 08/10/2017   Procedure: TRANSESOPHAGEAL ECHOCARDIOGRAM (TEE);  Surgeon: Larey Dresser, MD;  Location: [facility] ENDOSCOPY;  Service: Cardiovascular;  Laterality: N/A;  . TONSILLECTOMY  ~ 1970    Family History:  Family History  Problem Relation Age of Onset  . Hypertension Mother   . Diabetes Mother   . Hypertension Father   . Diabetes Father   . Hyperlipidemia Father     Social History: Social History   Socioeconomic History  . Marital status: Divorced    Spouse name: n/a  . Number of children: 0  . Years of education: 12th grade  . Highest education level: Not on file  Occupational History  . Occupation: Data processing manager ---self employed    Employer: [employer]  Social Needs  . Financial resource strain: Not on file  . Food insecurity:    Worry: Not on file    Inability: Not on file  . Transportation needs:    Medical: Not on file    Non-medical: Not on file  Tobacco Use  . Smoking status: Never Smoker  . Smokeless tobacco: Current User    Types: Chew  Substance and Sexual Activity  . Alcohol use: Yes    Alcohol/week: 3.6 oz    Types: 6 Shots of liquor per week  . Drug use: No  . Sexual activity: Yes    Partners: Female  Lifestyle  . Physical activity:    Days per week: Not on file    Minutes per session: Not  on file  . Stress: Not on file  Relationships  . Social connections:    Talks on phone: Not on file    Gets together: Not on file  Attends religious service: Not on file    Active member of club or organization: Not on file    Attends meetings of clubs or organizations: Not on file    Relationship status: Not on file  Other Topics Concern  . Not on file  Social History Narrative   Exercise-- walking    Lives alone.   Brother lives in [place], [place]    Allergies:  No Known Allergies  Objective:    Vital Signs:   Temp:  [97.7 F (36.5 C)] 97.7 F (36.5 C) (04/03 0754) Pulse Rate:  [61-65] 65 (04/03 1059) Resp:  [11-60] 60 (04/03 1059) BP: (102-137)/(67-85) 128/80 (04/03 1059) SpO2:  [96 %-98 %] 96 % (04/03 1059) Weight:  [198 lb (89.8 kg)] 198 lb (89.8 kg) (04/03 0754)   Filed Weights   10/03/17 0754  Weight: 198 lb (89.8 kg)     Physical Exam     General:  Well appearing. No respiratory difficulty HEENT: Normal Neck: Supple. JVP 5-6. Carotids 2+ bilat; no bruits. No lymphadenopathy or thyromegaly appreciated. Cor: PMI nondisplaced. Regular rate & rhythm. No rubs, gallops or murmurs. Lungs: Clear Abdomen: Soft, nontender, nondistended. No hepatosplenomegaly. No bruits or masses. Good bowel sounds. Extremities: No cyanosis, clubbing, rash, edema Neuro: Alert & oriented x 3, cranial nerves grossly intact. moves all 4 extremities w/o difficulty. Affect pleasant.   Telemetry   NSR, personally reviewed.   EKG   Pending.   Labs     Basic Metabolic Panel: Recent Labs  Lab 10/01/17 1330  NA 139  K 5.1  CL 108  CO2 21*  GLUCOSE 125*  BUN 22*  CREATININE 1.31*  CALCIUM 9.4    Liver Function Tests: No results for input(s): AST, ALT, ALKPHOS, BILITOT, PROT, ALBUMIN in the last 168 hours. No results for input(s): LIPASE, AMYLASE in the last 168 hours. No results for input(s): AMMONIA in the last 168 hours.  CBC: Recent Labs  Lab 10/01/17 1330    WBC 5.3  HGB 13.0  HCT 40.3  MCV 109.5*  PLT 163    Cardiac Enzymes: No results for input(s): CKTOTAL, CKMB, CKMBINDEX, TROPONINI in the last 168 hours.  BNP: BNP (last 3 results) Recent Labs    08/07/17 1849  BNP 423.7*    ProBNP (last 3 results) No results for input(s): PROBNP in the last 8760 hours.   CBG: Recent Labs  Lab 10/01/17 1302 10/03/17 0754  GLUCAP 124* 92    Coagulation Studies: Recent Labs    10/03/17 0751  LABPROT 13.7  INR 1.06    Imaging:  No results found.   Patient Profile  Mr Scannell ins a 57 year old with history of A flutter, chronic systolic heart failure, CAD s/p CABG, CKD Stage III, DMII, and OSA. Has A flutter ablation 09/12/2017.     Assessment/Plan   1. Chronic Systolic Heart Failure , ICM, single chamber medtronic ICD.  ECHO 2/7/019 EF 15% Mildly dilated RV. CPX in 2/19 was suggestive of severe limitation due to HF.  RHC completed today with  Normal filling pressures, adequate cardiac output, and moderate pulmonary hth.  Continue current HF medications.   2. ILD Followed by Dr Ramaswamy.PFTs in 2/19 were restrictive, and high resolution CT showed interstitial lung disease.   Concern for amio toxicity. Amio has been stopped.   3. Dental Cariers-  Followed by Dr Kulinski. Plan for extractions tooth numbers 5-14 in am.  Eliquis has been held.   4. PAF EKG Anticoagulants on hold   for sugery tomorrow.   5. CAD S/P CABG No chest pain. Continue statin, bb. No eliquis due to planned dental surgery tomorrow.    6. CKD Stage III Check BMET in am   7. OSA  Admit to tele. Anticipate d/c after dental surgery if all goes well.   Darrick Grinder, NP 10/03/2017, 11:13 AM  Advanced Heart Failure Team Pager [phone] (M-F; [place])  Please contact [facility] for night-coverage after hours (4p -7a ) and weekends on [email or web address]  Patient seen with NP, agree with the above note.    He has been doing well recently, NYHA class II  symptoms.    RHC today:  RA mean 5 RV 51/5 PA 54/18, mean 31 PCWP mean 12 Oxygen saturations: PA 66% AO 96% Cardiac Output (Fick) 5.22  Cardiac Index (Fick) 2.51 PVR 3.6 WU  On exam, he is not volume overloaded with no JVD or edema. Regular S1S2, no murmur.  Clear lungs.   He will be admitted to overnight after cath to have tooth extractions with Dr. Enrique Sack tomorrow.  Eliquis is on hold (DCCV was in 2/19 and he has been in NSR since that time).  - Restart Eliquis after procedure.   RHC showed optimized filling pressures and preserved cardiac output.  We had been working him up for LVAD based on poor CPX and low output at last hospitalization, but he seems to have made improvement on the current medication regimen and also now has been found to have significant interstitial lung disease.  - Continue current digoxin, Coreg, Entresto, and spironolactone.  Will check digoxin level in am.  - He has not been on furosemide and does not appear to need it based on RHC.  - Wide QRS with LBBB on prior ECG, now off amiodarone.  Will repeat ECG, if still with wide LBBB will need to consider CRT though will need to check with Dr. Caryl Comes to see how long he would want amiodarone to wash out.   - LVAD workup currently on hold with clinical improvement based on today's cath and recent symptoms as well as ILD.   ILD being followed by [name].  May need bronchoscopy.  Now off amiodarone for ?toxicity, will see if he has improvement off this medication.   Pulmonary arterial hypertension on RHC today is likely group 3 PH from interstitial lung disease.   Atrial flutter with DCCV in 2/19 and later ablation.   Loralie Champagne 10/03/2017 11:49 AM

## 2017-10-04 ENCOUNTER — Observation Stay (HOSPITAL_COMMUNITY): Payer: Medicare HMO | Admitting: Certified Registered Nurse Anesthetist

## 2017-10-04 ENCOUNTER — Encounter (HOSPITAL_COMMUNITY)
Admission: RE | Disposition: A | Discharge status: Home / Self Care | Payer: Self-pay | Source: Ambulatory Visit | Attending: Cardiology

## 2017-10-04 ENCOUNTER — Observation Stay (HOSPITAL_COMMUNITY): Payer: Medicare HMO | Admitting: Emergency Medicine

## 2017-10-04 ENCOUNTER — Encounter (HOSPITAL_COMMUNITY): Payer: Self-pay | Admitting: Cardiology

## 2017-10-04 DIAGNOSIS — K029 Dental caries, unspecified: Secondary | ICD-10-CM | POA: Diagnosis not present

## 2017-10-04 DIAGNOSIS — I2510 Atherosclerotic heart disease of native coronary artery without angina pectoris: Secondary | ICD-10-CM | POA: Diagnosis not present

## 2017-10-04 DIAGNOSIS — I130 Hypertensive heart and chronic kidney disease with heart failure and stage 1 through stage 4 chronic kidney disease, or unspecified chronic kidney disease: Secondary | ICD-10-CM | POA: Diagnosis not present

## 2017-10-04 DIAGNOSIS — I509 Heart failure, unspecified: Secondary | ICD-10-CM | POA: Diagnosis not present

## 2017-10-04 DIAGNOSIS — E1122 Type 2 diabetes mellitus with diabetic chronic kidney disease: Secondary | ICD-10-CM | POA: Diagnosis not present

## 2017-10-04 DIAGNOSIS — I5023 Acute on chronic systolic (congestive) heart failure: Secondary | ICD-10-CM

## 2017-10-04 DIAGNOSIS — N183 Chronic kidney disease, stage 3 (moderate): Secondary | ICD-10-CM | POA: Diagnosis not present

## 2017-10-04 DIAGNOSIS — K083 Retained dental root: Secondary | ICD-10-CM

## 2017-10-04 DIAGNOSIS — E785 Hyperlipidemia, unspecified: Secondary | ICD-10-CM | POA: Diagnosis not present

## 2017-10-04 DIAGNOSIS — K0530 Chronic periodontitis, unspecified: Secondary | ICD-10-CM | POA: Diagnosis not present

## 2017-10-04 DIAGNOSIS — G4733 Obstructive sleep apnea (adult) (pediatric): Secondary | ICD-10-CM | POA: Diagnosis not present

## 2017-10-04 DIAGNOSIS — I480 Paroxysmal atrial fibrillation: Secondary | ICD-10-CM | POA: Diagnosis not present

## 2017-10-04 DIAGNOSIS — I2721 Secondary pulmonary arterial hypertension: Secondary | ICD-10-CM | POA: Diagnosis not present

## 2017-10-04 DIAGNOSIS — I5022 Chronic systolic (congestive) heart failure: Secondary | ICD-10-CM | POA: Diagnosis not present

## 2017-10-04 DIAGNOSIS — K036 Deposits [accretions] on teeth: Secondary | ICD-10-CM

## 2017-10-04 DIAGNOSIS — Z01818 Encounter for other preprocedural examination: Secondary | ICD-10-CM

## 2017-10-04 DIAGNOSIS — J849 Interstitial pulmonary disease, unspecified: Secondary | ICD-10-CM | POA: Diagnosis not present

## 2017-10-04 DIAGNOSIS — I272 Pulmonary hypertension, unspecified: Secondary | ICD-10-CM | POA: Diagnosis not present

## 2017-10-04 HISTORY — PX: MULTIPLE EXTRACTIONS WITH ALVEOLOPLASTY: SHX5342

## 2017-10-04 LAB — BASIC METABOLIC PANEL
Anion gap: 8 (ref 5–15)
BUN: 19 mg/dL (ref 6–20)
CALCIUM: 8.8 mg/dL — AB (ref 8.9–10.3)
CO2: 22 mmol/L (ref 22–32)
Chloride: 108 mmol/L (ref 101–111)
Creatinine, Ser: 1.27 mg/dL — ABNORMAL HIGH (ref 0.61–1.24)
Glucose, Bld: 102 mg/dL — ABNORMAL HIGH (ref 65–99)
POTASSIUM: 4.2 mmol/L (ref 3.5–5.1)
Sodium: 138 mmol/L (ref 135–145)

## 2017-10-04 LAB — GLUCOSE, CAPILLARY
GLUCOSE-CAPILLARY: 233 mg/dL — AB (ref 65–99)
Glucose-Capillary: 117 mg/dL — ABNORMAL HIGH (ref 65–99)

## 2017-10-04 LAB — SURGICAL PCR SCREEN
MRSA, PCR: NEGATIVE
STAPHYLOCOCCUS AUREUS: POSITIVE — AB

## 2017-10-04 SURGERY — MULTIPLE EXTRACTION WITH ALVEOLOPLASTY
Anesthesia: General | Site: Mouth

## 2017-10-04 MED ORDER — LIDOCAINE-EPINEPHRINE 2 %-1:100000 IJ SOLN
INTRAMUSCULAR | Status: DC | PRN
  Administered 2017-10-04: 6.8 mL via INTRADERMAL

## 2017-10-04 MED ORDER — HYDROCODONE-ACETAMINOPHEN 5-325 MG PO TABS
1.0000 | ORAL_TABLET | Freq: Four times a day (QID) | ORAL | Status: DC | PRN
  Administered 2017-10-04: 2 via ORAL
  Filled 2017-10-04: qty 2

## 2017-10-04 MED ORDER — HEMOSTATIC AGENTS (NO CHARGE) OPTIME
TOPICAL | Status: DC | PRN
  Administered 2017-10-04: 1 via TOPICAL

## 2017-10-04 MED ORDER — DEXAMETHASONE SODIUM PHOSPHATE 10 MG/ML IJ SOLN
INTRAMUSCULAR | Status: DC | PRN
  Administered 2017-10-04: 5 mg via INTRAVENOUS

## 2017-10-04 MED ORDER — PHENYLEPHRINE HCL 10 MG/ML IJ SOLN
INTRAVENOUS | Status: DC | PRN
  Administered 2017-10-04: 20 ug/min via INTRAVENOUS

## 2017-10-04 MED ORDER — LIDOCAINE 2% (20 MG/ML) 5 ML SYRINGE
INTRAMUSCULAR | Status: DC | PRN
  Administered 2017-10-04: 100 mg via INTRAVENOUS

## 2017-10-04 MED ORDER — 0.9 % SODIUM CHLORIDE (POUR BTL) OPTIME
TOPICAL | Status: DC | PRN
  Administered 2017-10-04: 1000 mL

## 2017-10-04 MED ORDER — ROCURONIUM BROMIDE 10 MG/ML (PF) SYRINGE
PREFILLED_SYRINGE | INTRAVENOUS | Status: DC | PRN
  Administered 2017-10-04: 50 mg via INTRAVENOUS

## 2017-10-04 MED ORDER — OXYMETAZOLINE HCL 0.05 % NA SOLN
NASAL | Status: AC
  Filled 2017-10-04: qty 15

## 2017-10-04 MED ORDER — ETOMIDATE 2 MG/ML IV SOLN
INTRAVENOUS | Status: DC | PRN
  Administered 2017-10-04: 14 mg via INTRAVENOUS

## 2017-10-04 MED ORDER — BUPIVACAINE-EPINEPHRINE (PF) 0.5% -1:200000 IJ SOLN
INTRAMUSCULAR | Status: AC
  Filled 2017-10-04: qty 3.6

## 2017-10-04 MED ORDER — CHLORHEXIDINE GLUCONATE CLOTH 2 % EX PADS: 6.0000 | MEDICATED_PAD | Freq: Every day | CUTANEOUS | Status: DC

## 2017-10-04 MED ORDER — MIDAZOLAM HCL 2 MG/2ML IJ SOLN
INTRAMUSCULAR | Status: DC | PRN
  Administered 2017-10-04: 2 mg via INTRAVENOUS

## 2017-10-04 MED ORDER — AMINOCAPROIC ACID SOLUTION 5% (50 MG/ML): 10.0000 mL | ORAL | 0 refills | Status: AC

## 2017-10-04 MED ORDER — LACTATED RINGERS IV SOLN
INTRAVENOUS | Status: DC | PRN
  Administered 2017-10-04: 07:00:00 via INTRAVENOUS

## 2017-10-04 MED ORDER — ONDANSETRON HCL 4 MG/2ML IJ SOLN
INTRAMUSCULAR | Status: DC | PRN
  Administered 2017-10-04: 4 mg via INTRAVENOUS

## 2017-10-04 MED ORDER — AMINOCAPROIC ACID SOLUTION 5% (50 MG/ML)
ORAL | Status: DC | PRN
  Administered 2017-10-04: 5 mL via ORAL

## 2017-10-04 MED ORDER — AMINOCAPROIC ACID SOLUTION 5% (50 MG/ML)
10.0000 mL | ORAL | Status: DC
  Administered 2017-10-04: 10 mL via ORAL
  Filled 2017-10-04: qty 100

## 2017-10-04 MED ORDER — FENTANYL CITRATE (PF) 250 MCG/5ML IJ SOLN
INTRAMUSCULAR | Status: DC | PRN
  Administered 2017-10-04 (×5): 50 ug via INTRAVENOUS

## 2017-10-04 MED ORDER — ONDANSETRON HCL 4 MG/2ML IJ SOLN: 4.0000 mg | Freq: Once | INTRAMUSCULAR | Status: DC | PRN

## 2017-10-04 MED ORDER — MUPIROCIN 2 % EX OINT: 1.0000 "application " | TOPICAL_OINTMENT | Freq: Two times a day (BID) | CUTANEOUS | 0 refills | Status: DC

## 2017-10-04 MED ORDER — SUGAMMADEX SODIUM 200 MG/2ML IV SOLN
INTRAVENOUS | Status: DC | PRN
  Administered 2017-10-04: 200 mg via INTRAVENOUS

## 2017-10-04 MED ORDER — HYDROMORPHONE HCL 1 MG/ML IJ SOLN: 0.2500 mg | INTRAMUSCULAR | Status: DC | PRN

## 2017-10-04 MED ORDER — LACTATED RINGERS IV SOLN: INTRAVENOUS | Status: DC

## 2017-10-04 MED ORDER — HYDROCODONE-ACETAMINOPHEN 5-325 MG PO TABS: 1.0000 | ORAL_TABLET | Freq: Four times a day (QID) | ORAL | 0 refills | Status: DC | PRN

## 2017-10-04 MED ORDER — MEPERIDINE HCL 50 MG/ML IJ SOLN: 6.2500 mg | INTRAMUSCULAR | Status: DC | PRN

## 2017-10-04 MED ORDER — MUPIROCIN 2 % EX OINT
1.0000 "application " | TOPICAL_OINTMENT | Freq: Two times a day (BID) | CUTANEOUS | Status: DC
  Administered 2017-10-04: 1 via NASAL
  Filled 2017-10-04: qty 22

## 2017-10-04 MED ORDER — APIXABAN 5 MG PO TABS: 5.0000 mg | ORAL_TABLET | Freq: Two times a day (BID) | ORAL | 3 refills | Status: DC

## 2017-10-04 MED ORDER — LIDOCAINE-EPINEPHRINE 2 %-1:100000 IJ SOLN
INTRAMUSCULAR | Status: AC
  Filled 2017-10-04: qty 10.2

## 2017-10-04 SURGICAL SUPPLY — 37 items
ALCOHOL 70% 16 OZ (MISCELLANEOUS) ×2 IMPLANT
ATTRACTOMAT 16X20 MAGNETIC DRP (DRAPES) ×2 IMPLANT
BLADE SURG 15 STRL LF DISP TIS (BLADE) ×2 IMPLANT
BLADE SURG 15 STRL SS (BLADE) ×4
COVER SURGICAL LIGHT HANDLE (MISCELLANEOUS) ×2 IMPLANT
GAUZE PACKING FOLDED 2  STR (GAUZE/BANDAGES/DRESSINGS) ×1
GAUZE PACKING FOLDED 2 STR (GAUZE/BANDAGES/DRESSINGS) ×1 IMPLANT
GAUZE SPONGE 4X4 16PLY XRAY LF (GAUZE/BANDAGES/DRESSINGS) ×2 IMPLANT
GLOVE BIO SURGEON STRL SZ 6.5 (GLOVE) ×2 IMPLANT
GLOVE SURG ORTHO 8.0 STRL STRW (GLOVE) ×2 IMPLANT
GOWN STRL REUS W/ TWL LRG LVL3 (GOWN DISPOSABLE) ×1 IMPLANT
GOWN STRL REUS W/TWL 2XL LVL3 (GOWN DISPOSABLE) ×2 IMPLANT
GOWN STRL REUS W/TWL LRG LVL3 (GOWN DISPOSABLE) ×2
HEMOSTAT SURGICEL 2X14 (HEMOSTASIS) ×2 IMPLANT
KIT BASIN OR (CUSTOM PROCEDURE TRAY) ×2 IMPLANT
KIT TURNOVER KIT B (KITS) ×2 IMPLANT
MANIFOLD NEPTUNE WASTE (CANNULA) ×2 IMPLANT
NDL BLUNT 16X1.5 OR ONLY (NEEDLE) ×1 IMPLANT
NDL DENTAL 27 LONG (NEEDLE) ×2 IMPLANT
NEEDLE BLUNT 16X1.5 OR ONLY (NEEDLE) ×2 IMPLANT
NEEDLE DENTAL 27 LONG (NEEDLE) ×4 IMPLANT
NS IRRIG 1000ML POUR BTL (IV SOLUTION) ×2 IMPLANT
PACK EENT II TURBAN DRAPE (CUSTOM PROCEDURE TRAY) ×2 IMPLANT
PAD ARMBOARD 7.5X6 YLW CONV (MISCELLANEOUS) ×1 IMPLANT
SPONGE SURGIFOAM ABS GEL 100 (HEMOSTASIS) IMPLANT
SPONGE SURGIFOAM ABS GEL 12-7 (HEMOSTASIS) ×1 IMPLANT
SPONGE SURGIFOAM ABS GEL SZ50 (HEMOSTASIS) IMPLANT
SUCTION FRAZIER HANDLE 10FR (MISCELLANEOUS) ×1
SUCTION TUBE FRAZIER 10FR DISP (MISCELLANEOUS) ×1 IMPLANT
SUT CHROMIC 3 0 PS 2 (SUTURE) ×3 IMPLANT
SUT CHROMIC 4 0 P 3 18 (SUTURE) IMPLANT
SYR 50ML SLIP (SYRINGE) ×2 IMPLANT
TOWEL NATURAL 10PK STERILE (DISPOSABLE) ×2 IMPLANT
TUBE CONNECTING 12X1/4 (SUCTIONS) ×2 IMPLANT
WATER STERILE IRR 1000ML POUR (IV SOLUTION) ×1 IMPLANT
WATER TABLETS ICX (MISCELLANEOUS) ×1 IMPLANT
YANKAUER SUCT BULB TIP NO VENT (SUCTIONS) ×2 IMPLANT

## 2017-10-04 NOTE — Discharge Summary (Addendum)
Advanced Heart Failure Team  Discharge Summary   Patient ID: Vincent Chandler MRN: [id number], DOB/AGE: 10-Mar-1961 57 y.o. Admit date: 10/03/2017 D/C date:     10/04/2017   Primary Discharge Diagnoses:  1. Chronic Systolic Heart Failure 2. ILD 3. Dental Caries 4. PAF 5. CAD S/P CABG 6. CKD Stage III 7. OSA   Hospital Course:  Vincent Chandler ins a 57 year old with history of A flutter, chronic systolic heart failure, CADs/pCABG, CKD Stage III, DMII, and OSA. Has A flutter ablation 09/12/2017.   Admitted for RHC and dental procedure.   1. Chronic Systolic Heart Failure , ICM, single chamber medtronic ICD.  ECHO 2/7/019 EF 15% Mildly dilated RV.CPX in 2/19 was suggestive of severe limitation due to HF.  RHC4/10/2017 normal filling pressures, adequate cardiac output, and moderate pulmonary hth. Continue current HF medications.  QRS remains wide so will need Biv. Vincent Chandler has follow up with Dr Caryl Comes on April 25th.  2. ILD Followed by Dr Chase Caller.PFTs in 2/19 were restrictive, and high resolution CT showed interstitial lung disease.   Concern for amio toxicity. Amio has been stopped.  3. Dental Cariers-  Followed by Dr Enrique Sack. S/P tooth numbers 5 &14 today.  Mouth care per Dr Enrique Sack. Vincent Chandler provided instructions.  4. PAF S/P Ablation 09/12/2017  Maintaining NSR. Plan to resume eliquis in am.  5. CAD S/P CABG No chest pain. Continue statin, bb. Resume eliquis 4/5.   6. CKD Stage III Renal function was stable.  7. OSA  Discharge Vitals: Blood pressure 118/74, pulse (!) 56, temperature (!) 97.5 F (36.4 C), resp. rate 16, height _0  (1.778 m), weight 195 lb 1.6 oz (88.5 kg), SpO2 97 %.   General:   No resp difficulty HEENT: normal ice pack on his jaws Neck: supple. no JVD. Carotids 2+ bilat; no bruits. No lymphadenopathy or thryomegaly appreciated. Cor: PMI nondisplaced. Regular rate & rhythm. No rubs, gallops or murmurs. Lungs: clear Abdomen: soft, nontender, nondistended. No  hepatosplenomegaly. No bruits or masses. Good bowel sounds. Extremities: no cyanosis, clubbing, rash, edema Neuro: alert & orientedx3, cranial nerves grossly intact. moves all 4 extremities w/o difficulty. Affect pleasant  RHC 10/03/2017  RA mean 5 RV 51/5 PA 54/18, mean 31 PCWP mean 12 Oxygen saturations: PA 66% AO 96% Cardiac Output (Fick) 5.22  Cardiac Index (Fick) 2.51 PVR 3.6 WU  Labs: Lab Results  Component Value Date   WBC 5.3 10/01/2017   HGB 13.0 10/01/2017   HCT 40.3 10/01/2017   MCV 109.5 (H) 10/01/2017   PLT 163 10/01/2017    Recent Labs  Lab 10/04/17 0350  NA 138  K 4.2  CL 108  CO2 22  BUN 19  CREATININE 1.27*  CALCIUM 8.8*  GLUCOSE 102*   Lab Results  Component Value Date   CHOL 109 09/10/2017   HDL 28 (L) 09/10/2017   LDLCALC 52 09/10/2017   TRIG 147 09/10/2017   BNP (last 3 results) Recent Labs    08/07/17 1849  BNP 423.7*    ProBNP (last 3 results) No results for input(s): PROBNP in the last 8760 hours.   Diagnostic Studies/Procedures   No results found.  Discharge Medications   Allergies as of 10/04/2017   No Known Allergies     Medication List    STOP taking these medications   amiodarone 200 MG tablet Commonly known as:  PACERONE     TAKE these medications   ACCU-CHEK FASTCLIX LANCETS Misc Use as directed once a day.  Dx code: E11.9   ACCU-CHEK GUIDE w/Device Kit 1 each by Does not apply route daily. DX Code: E11.9   aminocaproic acid 5 % Soln Commonly known as:  AMICAR Take 10 mLs by mouth every hour for 10 doses.   apixaban 5 MG Tabs tablet Commonly known as:  ELIQUIS Take 1 tablet (5 mg total) by mouth 2 (two) times daily.   atorvastatin 40 MG tablet Commonly known as:  LIPITOR Take 1 tablet (40 mg total) by mouth at bedtime.   blood glucose meter kit and supplies Dispense based on patient and insurance preference. Use as directed once a day. Dx Code E11.9.   carvedilol 6.25 MG tablet Commonly known as:   COREG Take 1 tablet (6.25 mg total) by mouth 2 (two) times daily with a meal.   Digoxin 62.5 MCG Tabs Take 0.0625 mg by mouth daily. What changed:  how much to take   ENTRESTO 49-51 MG Generic drug:  sacubitril-valsartan Take 1 tablet by mouth 2 (two) times daily.   glucose blood test strip Commonly known as:  ACCU-CHEK GUIDE Use as directed once a day.  Dx code: E11.9   guaiFENesin 600 MG 12 hr tablet Commonly known as:  MUCINEX Take 1 tablet (600 mg total) by mouth 2 (two) times daily as needed for to loosen phlegm.   HYDROcodone-acetaminophen 5-325 MG tablet Commonly known as:  NORCO/VICODIN Take 1-2 tablets by mouth every 6 (six) hours as needed for moderate pain or severe pain.   metFORMIN 500 MG tablet Commonly known as:  GLUCOPHAGE Take 1 tablet (500 mg total) by mouth 2 (two) times daily with a meal.   mometasone-formoterol 200-5 MCG/ACT Aero Commonly known as:  DULERA Inhale 2 puffs into the lungs 2 (two) times daily. Further refills per PCP   mupirocin ointment 2 % Commonly known as:  BACTROBAN Place 1 application into the nose 2 (two) times daily. For 1 week   nitroGLYCERIN 0.4 MG SL tablet Commonly known as:  NITROSTAT Place 1 tablet (0.4 mg total) under the tongue every 5 (five) minutes as needed for chest pain.   spironolactone 25 MG tablet Commonly known as:  ALDACTONE Take 0.5 tablets (12.5 mg total) by mouth daily.       Disposition   The patient will be discharged in stable condition to home. Discharge Instructions    (HEART FAILURE PATIENTS) Call MD:  Anytime you have any of the following symptoms: 1) 3 pound weight gain in 24 hours or 5 pounds in 1 week 2) shortness of breath, with or without a dry hacking cough 3) swelling in the hands, feet or stomach 4) if you have to sleep on extra pillows at night in order to breathe.   Complete by:  As directed    Diet - low sodium heart healthy   Complete by:  As directed    Discharge instructions    Complete by:  As directed    Mouth care per Dr Enrique Sack   Heart Failure patients record your daily weight using the same scale at the same time of day   Complete by:  As directed    Increase activity slowly   Complete by:  As directed      Follow-up Information    [name], DDS. Schedule an appointment as soon as possible for a visit on 10/16/2017.   Specialty:  Dentistry Why:  For suture removal Contact information: [address] [place] [address] [phone]  Larey Dresser, MD Follow up on 10/19/2017.   Specialty:  Cardiology Why:  at  1140  Contact information: [address] [place] [address] [phone]             Duration of Discharge Encounter: Greater than 35 minutes   Signed, Amy Clegg NP-C   10/04/2017, 11:49 AM  Patient stable s/p tooth extractions.  Not volume overloaded on exam.  Vincent Chandler can start back on Eliquis tomorrow.   QRS still wide at 154 msec with LBBB, has been off amiodarone about 3 weeks.  Vincent Chandler has followup later in the month with Dr. Caryl Comes, if QRS remains wide at that time, Vincent Chandler will need CRT upgrade.   Loralie Champagne 10/04/2017 12:12 PM

## 2017-10-04 NOTE — Op Note (Signed)
OPERATIVE REPORT  Patient:            Vincent Chandler Date of Birth:  27-Jan-1961 MRN:                [id number]   DATE OF PROCEDURE:  10/04/2017  PREOPERATIVE DIAGNOSES: 1.  Congestive heart failure  2.  Pre-LVAD procedure dental protocol 3.  Retained root segment 4.  Dental caries 5.  Chronic periodontitis 6.  Accretions  POSTOPERATIVE DIAGNOSES: 1.  Congestive heart failure  2.  Pre-LVAD procedure dental protocol 3.  Retained root segment 4.  Dental caries 5.  Chronic periodontitis 6.  Accretions  OPERATIONS: 1. Multiple extraction of tooth numbers 5 and 14 with alveoloplasty 2. Gross debridement of remaining dentition   SURGEON: [name], DDS  ASSISTANT: Molli Posey (dental assistant)  ANESTHESIA: General anesthesia via oral endotracheal tube.  MEDICATIONS: 1. Ancef 2 g IV prior to invasive dental procedures. 2. Local anesthesia with a total utilization of 4 carpules each containing 34 mg of lidocaine with 0.017 mg of epinephrine.  SPECIMENS: There are 2 teeth that were discarded.  DRAINS: None  CULTURES: None  COMPLICATIONS: None  ESTIMATED BLOOD LOSS: Less than 50 mLs.  INTRAVENOUS FLUIDS: 300 mLs of Lactated ringers solution.  INDICATIONS: The patient was recently diagnosed with congestive heart failure with the need for LVAD placement.  A medically necessary dental consultation was then requested to evaluate poor dentition.  The patient was examined and treatment planned for extraction of indicated teeth with alveoloplasty and gross remaining remaining dentition in the operating room with general anesthesia.  This treatment plan was formulated to decrease the risks and complications associated with dental infection from affecting the patient's systemic health and the anticipated LVAD procedure.  OPERATIVE FINDINGS: Patient was examined operating room number 8.  The teeth were identified for extraction. The patient was noted be affected by  chronic periodontitis, retained root segment, dental caries, and Accretions.   DESCRIPTION OF PROCEDURE: Patient was brought to the main operating room number 8. Patient was then placed in the supine position on the operating table. General anesthesia was then induced per the anesthesia team. The patient was then prepped and draped in the usual manner for dental medicine procedure. A timeout was performed. The patient was identified and procedures were verified. A throat pack was placed at this time. The oral cavity was then thoroughly examined with the findings noted above. The patient was then ready for dental medicine procedure as follows:  Local anesthesia was then administered sequentially with a total utilization of 4 carpules each containing 34 mg of lidocaine with 0.017 mg of epinephrine.  The Maxillary left and right quadrants first approached. Anesthesia was then delivered utilizing infiltration with lidocaine with epinephrine. A #15 blade incision was then made in a sulcular fashion around tooth numbers 5 and 14. Surgical flaps were then carefully reflected.  Tooth numbers 5 and 14 were then subluxated with a series of straight elevators.  Tooth #5 was then removed with a 150 forceps without complications.  Tooth #14 was then removed with a 53L forceps without complications.  Alveoloplasty was then performed utilizing a ronguers and bone file as needed. The surgical sites were then irrigated with copious amounts of sterile saline.  A piece of Surgicel was placed in each extraction socket appropriately.  A piece of Surgifoam was placed additionally in the extraction socket #5.  The surgical site in the area of #5 was then closed from the mesial of #  4 and extended the distal of #6 utilizing 3-0 chromic gut suture in a continuous interrupted suture technique x1.  The maxillary left surgical site was then closed from the mesial #15 and extended to the distal of #13 utilizing 3-0 chromic gut suture in  a continuous interrupted suture technique x1.    At this point time, the remaining dentition was approached.  A sonic scaler was used to remove accretions.  A series of hand curettes were then used to further remove accretions.  The Sonic scaler was then again used to further refine the removal of Accretions.  This completed the gross debridement procedure.    At this point time, the entire mouth was irrigated with copious amounts of sterile saline. The patient was examined for complications, seeing none, the dental medicine procedure was deemed to be complete. The throat pack was removed at this time. An oral airway was then placed at the request of the anesthesia team. A series of 4 x 4 gauze moistened with Amicar 5% rinse were placed in the mouth to aid hemostasis. The patient was then handed over to the anesthesia team for final disposition. After an appropriate amount of time, the patient was extubated and taken to the postanesthsia care unit in good condition. All counts were correct for the dental medicine procedure.  The patient is to continue the Amicar 5% rinses postoperatively.  Patient is to rinse with 10 mL's every hour for the next 10 hours and a swish and spit manner.  Patient is to utilize hydrocodone/acetaminophen 5/325 taking 1-2 tablets every 6 hours by mouth as needed for moderate to severe pain.  Patient is to restart his Eliquis therapy tomorrow morning.   [name], DDS.

## 2017-10-04 NOTE — Discharge Instructions (Signed)
MOUTH CARE AFTER SURGERY  FACTS:  Ice used in ice bag helps keep the swelling down, and can help lessen the pain.  It is easier to treat pain BEFORE it happens.  Spitting disturbs the clot and may cause bleeding to start again, or to get worse.  Smoking delays healing and can cause complications.  Sharing prescriptions can be dangerous.  Do not take medications not recently prescribed for you.  Antibiotics may stop birth control pills from working.  Use other means of birth control while on antibiotics.  Warm salt water rinses after the first 24 hours will help lessen the swelling:  Use 1/2 teaspoonful of table salt per oz.of water.  DO NOT:  Do not spit.  Do not drink through a straw.  Strongly advised not to smoke, dip snuff or chew tobacco at least for 3 days.  Do not eat sharp or crunchy foods.  Avoid the area of surgery when chewing.  Do not stop your antibiotics before your instructions say to do so.  Do not eat hot foods until bleeding has stopped.  If you need to, let your food cool down to room temperature.  EXPECT:  Some swelling, especially first 2-3 days.  Soreness or discomfort in varying degrees.  Follow your dentist's instructions about how to handle pain before it starts.  Pinkish saliva or light blood in saliva, or on your pillow in the morning.  This can last around 24 hours.  Bruising inside or outside the mouth.  This may not show up until 2-3 days after surgery.  Don't worry, it will go away in time.  Pieces of "bone" may work themselves loose.  It's OK.  If they bother you, let us know.  WHAT TO DO IMMEDIATELY AFTER SURGERY:  Bite on the gauze with steady pressure for 1-2 hours.  Don't chew on the gauze.  Do not lie down flat.  Raise your head support especially for the first 24 hours.  Apply ice to your face on the side of the surgery.  You may apply it 20 minutes on and a few minutes off.  Ice for 8-12 hours.  You may use ice up to 24  hours.  Before the numbness wears off, take a pain pill as instructed.  Prescription pain medication is not always required.  SWELLING:  Expect swelling for the first couple of days.  It should get better after that.  If swelling increases 3 days or so after surgery; let us know as soon as possible.  FEVER:  Take Tylenol every 4 hours if needed to lower your temperature, especially if it is at 100F or higher.  Drink lots of fluids.  If the fever does not go away, let us know.  BREATHING TROUBLE:  Any unusual difficulty breathing means you have to have someone bring you to the emergency room ASAP  BLEEDING:  Light oozing is expected for 24 hours or so.  Prop head up with pillows  Avoid spitting  Do not confuse bright red fresh flowing blood with lots of saliva colored with a little bit of blood.  If you notice some bleeding, place gauze or a tea bag where it is bleeding and apply CONSTANT pressure by biting down for 1 hour.  Avoid talking during this time.  Do not remove the gauze or tea bag during this hour to "check" the bleeding.  If you notice bright RED bleeding FLOWING out of particular area, and filling the floor of your mouth, put  a wad of gauze on that area, bite down firmly and constantly.  Call us immediately.  If we're closed, have someone bring you to the emergency room.  ORAL HYGIENE:  Brush your teeth as usual after meals and before bedtime.  Use a soft toothbrush around the area of surgery.  DO NOT AVOID BRUSHING.  Otherwise bacteria(germs) will grow and may delay healing or encourage infection.  Since you cannot spit, just gently rinse and let the water flow out of your mouth.  DO NOT SWISH HARD.  EATING:  Cool liquids are a good point to start.  Increase to soft foods as tolerated.  PRESCRIPTIONS:  Follow the directions for your prescriptions exactly as written.  If Dr. Enrique Sack gave you a narcotic pain medication, do not drive, operate  machinery or drink alcohol when on that medication.  QUESTIONS:  Call our office during office hours [phone] or call the Emergency Room at [phone].

## 2017-10-04 NOTE — Anesthesia Preprocedure Evaluation (Signed)
Anesthesia Evaluation  Patient identified by MRN, date of birth, ID band Patient awake    Reviewed: Allergy & Precautions, NPO status , Patient's Chart, lab work & pertinent test results  Airway Mallampati: II  TM Distance: >3 FB Neck ROM: Full    Dental   Pulmonary asthma , sleep apnea ,    Pulmonary exam normal        Cardiovascular hypertension, Pt. on medications + angina with exertion + CAD, + Past MI, + Cardiac Stents and + CABG  Normal cardiovascular exam+ Cardiac Defibrillator      Neuro/Psych    GI/Hepatic GERD  Medicated and Controlled,  Endo/Other  diabetes, Type 2, Oral Hypoglycemic Agents  Renal/GU Renal InsufficiencyRenal disease     Musculoskeletal   Abdominal   Peds  Hematology   Anesthesia Other Findings   Reproductive/Obstetrics                             Anesthesia Physical Anesthesia Plan  ASA: III  Anesthesia Plan: General   Post-op Pain Management:    Induction: Intravenous  PONV Risk Score and Plan: 2 and Ondansetron and Treatment may vary due to age or medical condition  Airway Management Planned: LMA  Additional Equipment:   Intra-op Plan:   Post-operative Plan: Extubation in OR  Informed Consent: I have reviewed the patients History and Physical, chart, labs and discussed the procedure including the risks, benefits and alternatives for the proposed anesthesia with the patient or authorized representative who has indicated his/her understanding and acceptance.     Plan Discussed with: CRNA and Surgeon  Anesthesia Plan Comments:         Anesthesia Quick Evaluation

## 2017-10-04 NOTE — Anesthesia Procedure Notes (Signed)
Procedure Name: Intubation Date/Time: 10/04/2017 7:36 AM Performed by: White, Amedeo Plenty, CRNA Pre-anesthesia Checklist: Patient identified, Emergency Drugs available, Suction available and Patient being monitored Patient Re-evaluated:Patient Re-evaluated prior to induction Oxygen Delivery Method: Circle System Utilized Preoxygenation: Pre-oxygenation with 100% oxygen Induction Type: IV induction Ventilation: Mask ventilation without difficulty Laryngoscope Size: Mac and 4 Grade View: Grade I Tube type: Oral Tube size: 7.0 mm Number of attempts: 1 Airway Equipment and Method: Stylet and Oral airway Placement Confirmation: ETT inserted through vocal cords under direct vision,  positive ETCO2 and breath sounds checked- equal and bilateral Secured at: 23 cm Tube secured with: Tape Dental Injury: Teeth and Oropharynx as per pre-operative assessment

## 2017-10-04 NOTE — Progress Notes (Signed)
PRE-OPERATIVE NOTE:  10/04/2017 Vincent Chandler [id number]  VITALS: BP 105/62 (BP Location: Left Arm)   Pulse 68   Temp 97.9 F (36.6 C) (Oral)   Resp 18   Ht 5' 10" (1.778 m)   Wt 195 lb 1.6 oz (88.5 kg)   SpO2 95%   BMI 27.99 kg/m   Lab Results  Component Value Date   WBC 5.3 10/01/2017   HGB 13.0 10/01/2017   HCT 40.3 10/01/2017   MCV 109.5 (H) 10/01/2017   PLT 163 10/01/2017   BMET    Component Value Date/Time   NA 138 10/04/2017 0350   NA 141 09/07/2017 0955   K 4.2 10/04/2017 0350   CL 108 10/04/2017 0350   CO2 22 10/04/2017 0350   GLUCOSE 102 (H) 10/04/2017 0350   BUN 19 10/04/2017 0350   BUN 25 (H) 09/07/2017 0955   CREATININE 1.27 (H) 10/04/2017 0350   CREATININE 1.63 (H) 03/22/2016 1454   CALCIUM 8.8 (L) 10/04/2017 0350   GFRNONAA >60 10/04/2017 0350   GFRAA >60 10/04/2017 0350    Lab Results  Component Value Date   INR 1.06 10/03/2017   INR 1.32 08/17/2017   INR 1.06 08/07/2017   No results found for: PTT   Vincent Chandler presents for extraction of indicated teeth with alveoloplasty and gross debridement remaining dentition in the operating room with general anesthesia.     SUBJECTIVE: The patient denies any acute medical or dental changes and agrees to proceed with treatment as planned.  EXAM: No sign of acute dental changes.  ASSESSMENT: Patient is affected by a retained root segment, dental caries, chronic periodontitis, and Accretions.  PLAN: Patient agrees to proceed with treatment as planned in the operating room as previously discussed and accepts the risks, benefits, and complications of the proposed treatment. Patient is aware of the risk for bleeding, bruising, swelling, infection, pain, nerve damage, soft tissue damage, damage to adjacent teeth, sinus involvement, root tip fracture, mandible fracture, and the risks of complications associated with the anesthesia. Patient also is aware of the potential for other complications up to and  including death due to his overall cardiovascular compromise.     [name], DDS

## 2017-10-04 NOTE — Anesthesia Postprocedure Evaluation (Signed)
Anesthesia Post Note  Patient: [name]  Procedure(s) Performed: Extraction of tooth #'s 5 and 14 with alveoloplasty and gross debridement of remaining teeth (N/A Mouth)     Patient location during evaluation: PACU Anesthesia Type: General Level of consciousness: awake and alert Pain management: pain level controlled Vital Signs Assessment: post-procedure vital signs reviewed and stable Respiratory status: spontaneous breathing, nonlabored ventilation, respiratory function stable and patient connected to nasal cannula oxygen Cardiovascular status: blood pressure returned to baseline and stable Postop Assessment: no apparent nausea or vomiting Anesthetic complications: no    Last Vitals:  Vitals:   10/04/17 1010 10/04/17 1116  BP:  118/74  Pulse: (!) 58 (!) 56  Resp:  16  Temp:    SpO2:  97%    Last Pain:  Vitals:   10/04/17 0533  TempSrc: Oral  PainSc:                  Estuardo Frisbee DAVID

## 2017-10-04 NOTE — Transfer of Care (Signed)
Immediate Anesthesia Transfer of Care Note  Patient: Vincent Chandler  Procedure(s) Performed: Extraction of tooth #'s 5 and 14 with alveoloplasty and gross debridement of remaining teeth (N/A Mouth)  Patient Location: PACU  Anesthesia Type:General  Level of Consciousness: awake, alert , oriented and patient cooperative  Airway & Oxygen Therapy: Patient Spontanous Breathing and Patient connected to face mask oxygen  Post-op Assessment: Report given to RN and Post -op Vital signs reviewed and stable  Post vital signs: Reviewed and stable  Last Vitals:  Vitals Value Taken Time  BP 123/77 10/04/2017  8:35 AM  Temp    Pulse 58 10/04/2017  8:36 AM  Resp 9 10/04/2017  8:36 AM  SpO2 99 % 10/04/2017  8:36 AM  Vitals shown include unvalidated device data.  Last Pain:  Vitals:   10/04/17 0533  TempSrc: Oral  PainSc:       Patients Stated Pain Goal: 0 (30/73/54 3014)  Complications: No apparent anesthesia complications

## 2017-10-05 ENCOUNTER — Encounter (HOSPITAL_COMMUNITY): Payer: Self-pay | Admitting: Dentistry

## 2017-10-08 ENCOUNTER — Ambulatory Visit: Payer: Medicare Other | Admitting: Gastroenterology

## 2017-10-11 ENCOUNTER — Other Ambulatory Visit (HOSPITAL_COMMUNITY): Payer: Self-pay | Admitting: *Deleted

## 2017-10-11 ENCOUNTER — Other Ambulatory Visit (HOSPITAL_COMMUNITY): Payer: Self-pay

## 2017-10-11 LAB — CUP PACEART REMOTE DEVICE CHECK
Brady Statistic RV Percent Paced: 0.03 %
Date Time Interrogation Session: 20190320083826
HIGH POWER IMPEDANCE MEASURED VALUE: 71 Ohm
Implantable Lead Model: 293
Lead Channel Impedance Value: 456 Ohm
Lead Channel Impedance Value: 456 Ohm
Lead Channel Sensing Intrinsic Amplitude: 7.625 mV
Lead Channel Sensing Intrinsic Amplitude: 7.625 mV
MDC IDC LEAD IMPLANT DT: 20160715
MDC IDC LEAD LOCATION: 753862
MDC IDC LEAD SERIAL: 382379
MDC IDC MSMT BATTERY REMAINING LONGEVITY: 121 mo
MDC IDC MSMT BATTERY VOLTAGE: 3.01 V
MDC IDC MSMT LEADCHNL RV PACING THRESHOLD AMPLITUDE: 0.75 V
MDC IDC MSMT LEADCHNL RV PACING THRESHOLD PULSEWIDTH: 0.4 ms
MDC IDC PG IMPLANT DT: 20160715
MDC IDC SET LEADCHNL RV PACING AMPLITUDE: 2.5 V
MDC IDC SET LEADCHNL RV PACING PULSEWIDTH: 0.4 ms
MDC IDC SET LEADCHNL RV SENSING SENSITIVITY: 0.3 mV

## 2017-10-11 MED ORDER — SACUBITRIL-VALSARTAN 49-51 MG PO TABS: 1.0000 | ORAL_TABLET | Freq: Two times a day (BID) | ORAL | 0 refills | Status: DC

## 2017-10-11 MED FILL — ENTRESTO 49 MG-51 MG TABLET: 49-51 | 30 days supply | Qty: 60 | Fill #0

## 2017-10-11 MED FILL — SPIRONOLACTONE 25 MG TABLET: 25 | 30 days supply | Qty: 15 | Fill #1

## 2017-10-11 NOTE — Progress Notes (Addendum)
Paramedicine Encounter    Patient ID: Vincent Chandler, male    DOB: 03/07/1961, 57 y.o.   MRN: [id number]   Patient Care Team: [name], Alferd Apa, DO as PCP - General (Family Medicine) Burtis Junes, NP as Nurse Practitioner (Nurse Practitioner) Deboraha Sprang, MD as Consulting Physician (Cardiology) Allyn Kenner, MD (Dermatology)  Patient Active Problem List   Diagnosis Date Noted  . Acute on chronic systolic heart failure ([place]) 10/03/2017  . Retained dental root 08/30/2017  . Dental caries 08/30/2017  . Chronic periodontitis 08/30/2017  . Atrial flutter ([place]) 08/08/2017  . Dyspnea 08/07/2017  . Chronic systolic heart failure ([place]) 08/07/2017  . Hyperglycemia 08/07/2017  . Lower respiratory infection 08/07/2017  . Screening for colon cancer 08/07/2017  . CKD (chronic kidney disease) 2-3 07/19/2014  . NSTEMI (non-ST elevated myocardial infarction) ([place]) 07/18/2014  . OSA (obstructive sleep apnea) 07/18/2014  . Tachycardia   . V tach ([place]) 07/17/2014  . Hypoxemia 07/17/2014  . CHF (congestive heart failure) ([place]) 06/04/2012  . Sinus tachycardia 12/29/2010  . Coronary artery disease prior RCA stent with new inferior Q waves 10/18/2010  . Ischemic and nonischemic cardiomyopathy   10/18/2010  . Uncontrolled type 2 diabetes mellitus without complication, without long-term current use of insulin ([place]) 03/10/2010  . Hyperlipidemia 03/10/2010  . Essential hypertension 03/10/2010    Current Outpatient Medications:  .  ACCU-CHEK FASTCLIX LANCETS MISC, Use as directed once a day.  Dx code: E11.9, Disp: 100 each, Rfl: 1 .  apixaban (ELIQUIS) 5 MG TABS tablet, Take 1 tablet (5 mg total) by mouth 2 (two) times daily., Disp: 180 tablet, Rfl: 3 .  atorvastatin (LIPITOR) 40 MG tablet, Take 1 tablet (40 mg total) by mouth at bedtime., Disp: , Rfl:  .  blood glucose meter kit and supplies, Dispense based on patient and insurance preference. Use as directed once a day. Dx Code E11.9., Disp: 1  each, Rfl: 0 .  Blood Glucose Monitoring Suppl (ACCU-CHEK GUIDE) w/Device KIT, 1 each by Does not apply route daily. DX Code: E11.9, Disp: 1 kit, Rfl: 0 .  carvedilol (COREG) 6.25 MG tablet, Take 1 tablet (6.25 mg total) by mouth 2 (two) times daily with a meal., Disp: 60 tablet, Rfl: 3 .  Digoxin 62.5 MCG TABS, Take 0.0625 mg by mouth daily. (Patient taking differently: Take 0.5 tablets by mouth daily. ), Disp: 30 tablet, Rfl: 3 .  glucose blood (ACCU-CHEK GUIDE) test strip, Use as directed once a day.  Dx code: E11.9, Disp: 100 each, Rfl: 1 .  metFORMIN (GLUCOPHAGE) 500 MG tablet, Take 1 tablet (500 mg total) by mouth 2 (two) times daily with a meal., Disp: 60 tablet, Rfl: 1 .  mometasone-formoterol (DULERA) 200-5 MCG/ACT AERO, Inhale 2 puffs into the lungs 2 (two) times daily. Further refills per PCP, Disp: 1 Inhaler, Rfl: 0 .  sacubitril-valsartan (ENTRESTO) 49-51 MG, Take 1 tablet by mouth 2 (two) times daily., Disp: 30 tablet, Rfl: 0 .  spironolactone (ALDACTONE) 25 MG tablet, Take 0.5 tablets (12.5 mg total) by mouth daily., Disp: 15 tablet, Rfl: 3 .  guaiFENesin (MUCINEX) 600 MG 12 hr tablet, Take 1 tablet (600 mg total) by mouth 2 (two) times daily as needed for to loosen phlegm. (Patient not taking: Reported on 09/25/2017), Disp: , Rfl:  .  HYDROcodone-acetaminophen (NORCO/VICODIN) 5-325 MG tablet, Take 1-2 tablets by mouth every 6 (six) hours as needed for moderate pain or severe pain. (Patient not taking: Reported on 10/11/2017), Disp: 15 tablet,  Rfl: 0 .  mupirocin ointment (BACTROBAN) 2 %, Place 1 application into the nose 2 (two) times daily. For 1 week (Patient not taking: Reported on 10/11/2017), Disp: 22 g, Rfl: 0 .  nitroGLYCERIN (NITROSTAT) 0.4 MG SL tablet, Place 1 tablet (0.4 mg total) under the tongue every 5 (five) minutes as needed for chest pain. (Patient not taking: Reported on 09/25/2017), Disp: 25 tablet, Rfl: 3 No Known Allergies    Social History   Socioeconomic History   . Marital status: Divorced    Spouse name: n/a  . Number of children: 0  . Years of education: 12th grade  . Highest education level: Not on file  Occupational History  . Occupation: Data processing manager ---self employed    Employer: [employer]  Social Needs  . Financial resource strain: Not on file  . Food insecurity:    Worry: Not on file    Inability: Not on file  . Transportation needs:    Medical: Not on file    Non-medical: Not on file  Tobacco Use  . Smoking status: Never Smoker  . Smokeless tobacco: Current User    Types: Chew  Substance and Sexual Activity  . Alcohol use: Yes    Alcohol/week: 1.8 oz    Types: 3 Glasses of wine per week  . Drug use: No  . Sexual activity: Not Currently    Partners: Female  Lifestyle  . Physical activity:    Days per week: Not on file    Minutes per session: Not on file  . Stress: Not on file  Relationships  . Social connections:    Talks on phone: Not on file    Gets together: Not on file    Attends religious service: Not on file    Active member of club or organization: Not on file    Attends meetings of clubs or organizations: Not on file    Relationship status: Not on file  . Intimate partner violence:    Fear of current or ex partner: Not on file    Emotionally abused: Not on file    Physically abused: Not on file    Forced sexual activity: Not on file  Other Topics Concern  . Not on file  Social History Narrative   Exercise-- walking    Lives alone.   Brother lives in [place], [place]    Physical Exam      Future Appointments  Date Time Provider [place]  10/16/2017  8:30 AM [name], DDS [facility] None  10/19/2017 11:40 AM Larey Dresser, MD [facility] None  10/25/2017  1:45 PM Deboraha Sprang, MD [facility]  10/30/2017  3:00 PM [facility] PFT ROOM [facility] None  10/30/2017  4:30 PM Brand Males, MD [facility] None  11/08/2017 11:15 AM Ann Held, DO [facility]  11/20/2017  2:00 PM Sueanne Margarita, MD [facility]  12/19/2017  9:40 AM [facility] DEVICE REMOTES [facility]    BP 130/64   Pulse 66   Resp 15   Wt 197 lb (89.4 kg)   SpO2 94%   BMI 28.27 kg/m   Weight yesterday-? Last visit weight-202 CBG PTA-129   Pt reports he is doing well, he had procedures done last week and he feels ok from that, meds verified, pt handles all his meds and orders his own refills. Pt denies any sob, he has a lot of seasonal allergies and he is being bothered by those. No dizziness.  He is trying to lower his weight, eating small meals through out the day.  pts med insurance has kicked in but his entresto was $140 and he has only had to refill 2 of his meds since his insurance has started. Income is disability about $1600.  Will speak to erika about what kind of assistance he is on for his meds. Will f/u at his next clinic appointment next week.  Spoke to [place] and she confirmed that he has the med assist and he needs to call the company for the refills and not the pharmacy.  I relayed this to pt and advised him to call if any issues.   Marylouise Stacks, [facility] Paramedic  10/11/17

## 2017-10-12 MED FILL — DIGOXIN 0.125 MG TABLET: 125 | 30 days supply | Qty: 30 | Fill #2

## 2017-10-12 MED FILL — CARVEDILOL 6.25 MG TABLET: 6.25 | 30 days supply | Qty: 60 | Fill #1

## 2017-10-12 MED FILL — ATORVASTATIN 40 MG TABLET: 40 | 34 days supply | Qty: 34 | Fill #2

## 2017-10-12 MED FILL — metFORMIN HCL 500 MG TABS: 500 | 30 days supply | Qty: 60 | Fill #1

## 2017-10-15 DIAGNOSIS — H40013 Open angle with borderline findings, low risk, bilateral: Secondary | ICD-10-CM | POA: Diagnosis not present

## 2017-10-16 ENCOUNTER — Ambulatory Visit (HOSPITAL_COMMUNITY): Payer: Self-pay | Admitting: Dentistry

## 2017-10-16 ENCOUNTER — Encounter (HOSPITAL_COMMUNITY): Payer: Self-pay | Admitting: Dentistry

## 2017-10-16 VITALS — BP 125/67 | HR 62 | Temp 98.3°F

## 2017-10-16 DIAGNOSIS — K08199 Complete loss of teeth due to other specified cause, unspecified class: Secondary | ICD-10-CM

## 2017-10-16 DIAGNOSIS — I5022 Chronic systolic (congestive) heart failure: Secondary | ICD-10-CM

## 2017-10-16 DIAGNOSIS — Z01818 Encounter for other preprocedural examination: Secondary | ICD-10-CM

## 2017-10-16 NOTE — Progress Notes (Signed)
POST OPERATIVE NOTE:  10/16/2017 Vincent Chandler [id number]  VITALS: BP 125/67 (BP Location: Left Arm)   Pulse 62   Temp 98.3 F (36.8 C)    LABS:  Lab Results  Component Value Date   WBC 5.3 10/01/2017   HGB 13.0 10/01/2017   HCT 40.3 10/01/2017   MCV 109.5 (H) 10/01/2017   PLT 163 10/01/2017   BMET    Component Value Date/Time   NA 138 10/04/2017 0350   NA 141 09/07/2017 0955   K 4.2 10/04/2017 0350   CL 108 10/04/2017 0350   CO2 22 10/04/2017 0350   GLUCOSE 102 (H) 10/04/2017 0350   BUN 19 10/04/2017 0350   BUN 25 (H) 09/07/2017 0955   CREATININE 1.27 (H) 10/04/2017 0350   CREATININE 1.63 (H) 03/22/2016 1454   CALCIUM 8.8 (L) 10/04/2017 0350   GFRNONAA >60 10/04/2017 0350   GFRAA >60 10/04/2017 0350    Lab Results  Component Value Date   INR 1.06 10/03/2017   INR 1.32 08/17/2017   INR 1.06 08/07/2017   No results found for: PTT   Vincent Chandler is status post multiple extractions with alveoloplasty and gross debridement of remaining dentition on 10/04/2017 in the operating room. Patient now presents for evaluation of healing and suture removal as needed.  SUBJECTIVE: Patient denies any problems with his dental extraction sites. Patient denies having any active bleeding. Patient denies having any sutures that remain.   EXAM: There is no sign of infection, heme, or ooze. No sutures remain. Patient is healing in by secondary intention.  PROCEDURE: The patient was given a chlorhexidine gluconate rinse for 30 seconds. No sutures need to be removed.  ASSESSMENT: Post operative course is consistent with dental procedures performed in the operating with general anesthesia Loss of teeth due to extraction Chronic periodontitis   PLAN: 1. Continue salt water rinses as needed to aid healing. 2. Patient is currently cleared for LVAD procedure 3. Follow-up with the dentist of his choice for continued dental care, periodontal therapy, and evaluation on the distal of  #11 for dental restoration if indicated. 4. Patient will require antibiotic premedication prior to invasive dental procedures after the LVAD procedure.  [name], DDS

## 2017-10-19 ENCOUNTER — Ambulatory Visit (HOSPITAL_COMMUNITY)
Admission: RE | Admit: 2017-10-19 | Discharge: 2017-10-19 | Disposition: A | Discharge status: Home / Self Care | Payer: Medicare HMO | Source: Ambulatory Visit | Attending: Cardiology | Admitting: Cardiology

## 2017-10-19 VITALS — BP 106/58 | HR 68 | Wt 198.8 lb

## 2017-10-19 DIAGNOSIS — Z7984 Long term (current) use of oral hypoglycemic drugs: Secondary | ICD-10-CM | POA: Insufficient documentation

## 2017-10-19 DIAGNOSIS — N183 Chronic kidney disease, stage 3 unspecified: Secondary | ICD-10-CM

## 2017-10-19 DIAGNOSIS — J8410 Pulmonary fibrosis, unspecified: Secondary | ICD-10-CM | POA: Insufficient documentation

## 2017-10-19 DIAGNOSIS — I4892 Unspecified atrial flutter: Secondary | ICD-10-CM | POA: Insufficient documentation

## 2017-10-19 DIAGNOSIS — Z79899 Other long term (current) drug therapy: Secondary | ICD-10-CM | POA: Diagnosis not present

## 2017-10-19 DIAGNOSIS — I5022 Chronic systolic (congestive) heart failure: Secondary | ICD-10-CM | POA: Diagnosis not present

## 2017-10-19 DIAGNOSIS — G4733 Obstructive sleep apnea (adult) (pediatric): Secondary | ICD-10-CM | POA: Insufficient documentation

## 2017-10-19 DIAGNOSIS — I4891 Unspecified atrial fibrillation: Secondary | ICD-10-CM | POA: Insufficient documentation

## 2017-10-19 DIAGNOSIS — Z7901 Long term (current) use of anticoagulants: Secondary | ICD-10-CM | POA: Insufficient documentation

## 2017-10-19 DIAGNOSIS — I2510 Atherosclerotic heart disease of native coronary artery without angina pectoris: Secondary | ICD-10-CM | POA: Insufficient documentation

## 2017-10-19 DIAGNOSIS — J849 Interstitial pulmonary disease, unspecified: Secondary | ICD-10-CM

## 2017-10-19 DIAGNOSIS — I130 Hypertensive heart and chronic kidney disease with heart failure and stage 1 through stage 4 chronic kidney disease, or unspecified chronic kidney disease: Secondary | ICD-10-CM | POA: Diagnosis not present

## 2017-10-19 DIAGNOSIS — I25810 Atherosclerosis of coronary artery bypass graft(s) without angina pectoris: Secondary | ICD-10-CM | POA: Insufficient documentation

## 2017-10-19 LAB — BASIC METABOLIC PANEL
Anion gap: 8 (ref 5–15)
BUN: 17 mg/dL (ref 6–20)
CALCIUM: 9.1 mg/dL (ref 8.9–10.3)
CHLORIDE: 105 mmol/L (ref 101–111)
CO2: 24 mmol/L (ref 22–32)
Creatinine, Ser: 1.4 mg/dL — ABNORMAL HIGH (ref 0.61–1.24)
GFR calc non Af Amer: 55 mL/min — ABNORMAL LOW (ref >60)
Glucose, Bld: 210 mg/dL — ABNORMAL HIGH (ref 65–99)
Potassium: 4.4 mmol/L (ref 3.5–5.1)
SODIUM: 137 mmol/L (ref 135–145)

## 2017-10-19 LAB — DIGOXIN LEVEL: DIGOXIN LVL: 0.6 ng/mL — AB (ref 0.8–2.0)

## 2017-10-19 MED ORDER — SPIRONOLACTONE 25 MG PO TABS: 25.0000 mg | ORAL_TABLET | Freq: Every day | ORAL | 3 refills | Status: DC

## 2017-10-19 MED ORDER — CARVEDILOL 6.25 MG PO TABS: 9.3750 mg | ORAL_TABLET | Freq: Two times a day (BID) | ORAL | 3 refills | Status: DC

## 2017-10-19 MED ORDER — MOMETASONE FURO-FORMOTEROL FUM 200-5 MCG/ACT IN AERO: 2.0000 | INHALATION_SPRAY | Freq: Two times a day (BID) | RESPIRATORY_TRACT | 0 refills | Status: DC

## 2017-10-19 NOTE — Patient Instructions (Addendum)
Increase Spironolactone to 25 mg (1 tab) Daily  Increase Carvedilol to 9.375 mg (1 & 1/2 tabs) Twice daily   Labs today  Labs in 10 days  Keep appointment with Dr Chase Caller on 10/30/17 at 4:30 PM  Keep appointment with Dr Radford Pax on 11/20/17 at 2:00 PM  Your physician recommends that you schedule a follow-up appointment in: 1 month with Abbe Amsterdam D  Your physician recommends that you schedule a follow-up appointment in: 2 months with Dr [name]

## 2017-10-22 NOTE — Progress Notes (Signed)
PCP: Dr. Etter Sjogren Primary HF Cardiologist: Dr [name]  EP: Dr Caryl Comes   HPI: Vincent Chandler is a 57 year old with history of A flutter, chronic systolic heart failure, CAD s/p CABG, CKD Stage III, DMII, OSA.   Admitted 08/07/17 with atrial flutter/RVR and acute on chronic systolic heart failure. Underwent successful DC/CV on 2/8. Required short term milrinone but was able to wean off. HF meds started. Echo this admission with EF 15%. D/C weight 201 pounds.  CPX in 2/19 showed severe functional impairment due to heart failure. However, PFTs were restrictive and high resolution CT chest was concerning for interstitial lung disease.   He has had atrial flutter ablation.   He had RHC in 4/19 showing preserved cardiac output and low filling pressures, but moderate pulmonary hypertension.   He returns today for followup of CHF.  He feels very good overall.  Played 9 holes of golf this week, some fatigue with long walks on the golf course but generally did well.  No dyspnea except with walking up hills.  No chest pain.  No palpitations, he is in NSR today. He is not smoking or drinking ETOH. He does not require a diuretic currently.   ECG (personally reviewed): NSR, IVCD 148 msec  Labs (2/19): free T4 increased but free T3 normal, LDL 86, HDL 30 Labs (3/19): K 4.9, creatinine 1.53, hgb 14.3, LDL 52, ANA negative, RF negative Labs (4/19): K 4.2, creatinine 1.27  PMH:  1. Atrial fibrillation: Noted post-op CABG.  2. Atrial flutter: Noted during 2/19 admission, DCCV done.  S/p Ablation 3/19.  3. CAD: s/p CABG.  - LHC (1/16):  Patent LIMA-LAD and SVG-D. 80% mLCx and 99% PLOM, total occlusion of SVG-PLOM.  Total occlusion of RCA with occlusion of SVG-RCA.  Patient had DES to mLCx-PLOM.  4. HTN 5. Type II diabetes  6. Hyperlipidemia 7. Chronic systolic CHF: Ischemic cardiomyopathy.   - Echo (2/19): EF 15%, mildly dilated LV, mild LVH, inferolateral akinesis, milr Vincent, mildly dilated RV with severely reduced  systolic function.  - CPX (2/19): Peak VO2 12.2, VE/VCO2 slope 53, RER 1.07 => severe limitation from heart failure.  - [place] ICD  - RHC (4/19): mean RA 5, PA 54/18 mean 31, mean PCWP 12, CI 2.5, PVR 3.6 WU 8. Interstitial lung disease: PFTs (2/19) were restrictive, raising concern for ILD.  - High resolution CT chest in 2/19: interstitial lung disease concerning for usual interstitial pneumonitis. 9. CKD stage 3 10. OSA 11. ABIs (2/19): not significantly abnormal 12. Carotid dopplers (2/19): Mild disease only.  13. Pulmonary hypertension: ?Group 3 due to ILD.   ROS: All systems negative except as listed in HPI, PMH and Problem List.  Social History   Socioeconomic History  . Marital status: Divorced    Spouse name: n/a  . Number of children: 0  . Years of education: 12th grade  . Highest education level: Not on file  Occupational History  . Occupation: Data processing manager ---self employed    Employer: [employer]  Social Needs  . Financial resource strain: Not on file  . Food insecurity:    Worry: Not on file    Inability: Not on file  . Transportation needs:    Medical: Not on file    Non-medical: Not on file  Tobacco Use  . Smoking status: Never Smoker  . Smokeless tobacco: Current User    Types: Chew  Substance and Sexual Activity  . Alcohol use: Yes  Alcohol/week: 1.8 oz    Types: 3 Glasses of wine per week  . Drug use: No  . Sexual activity: Not Currently    Partners: Female  Lifestyle  . Physical activity:    Days per week: Not on file    Minutes per session: Not on file  . Stress: Not on file  Relationships  . Social connections:    Talks on phone: Not on file    Gets together: Not on file    Attends religious service: Not on file    Active member of club or organization: Not on file    Attends meetings of clubs or organizations: Not on file    Relationship status: Not on file  . Intimate partner violence:    Fear  of current or ex partner: Not on file    Emotionally abused: Not on file    Physically abused: Not on file    Forced sexual activity: Not on file  Other Topics Concern  . Not on file  Social History Narrative   Exercise-- walking    Lives alone.   Brother lives in [place], [place]    Family History  Problem Relation Age of Onset  . Hypertension Mother   . Diabetes Mother   . Hypertension Father   . Diabetes Father   . Hyperlipidemia Father     Current Outpatient Medications  Medication Sig Dispense Refill  . ACCU-CHEK FASTCLIX LANCETS MISC Use as directed once a day.  Dx code: E11.9 100 each 1  . apixaban (ELIQUIS) 5 MG TABS tablet Take 1 tablet (5 mg total) by mouth 2 (two) times daily. 180 tablet 3  . atorvastatin (LIPITOR) 40 MG tablet Take 1 tablet (40 mg total) by mouth at bedtime.    . blood glucose meter kit and supplies Dispense based on patient and insurance preference. Use as directed once a day. Dx Code E11.9. 1 each 0  . Blood Glucose Monitoring Suppl (ACCU-CHEK GUIDE) w/Device KIT 1 each by Does not apply route daily. DX Code: E11.9 1 kit 0  . carvedilol (COREG) 6.25 MG tablet Take 1.5 tablets (9.375 mg total) by mouth 2 (two) times daily with a meal. 90 tablet 3  . digoxin (LANOXIN) 0.125 MG tablet Take 0.0625 mg by mouth daily.    Marland Kitchen glucose blood (ACCU-CHEK GUIDE) test strip Use as directed once a day.  Dx code: E11.9 100 each 1  . guaiFENesin (MUCINEX) 600 MG 12 hr tablet Take 1 tablet (600 mg total) by mouth 2 (two) times daily as needed for to loosen phlegm.    . metFORMIN (GLUCOPHAGE) 500 MG tablet Take 1 tablet (500 mg total) by mouth 2 (two) times daily with a meal. 60 tablet 1  . mometasone-formoterol (DULERA) 200-5 MCG/ACT AERO Inhale 2 puffs into the lungs 2 (two) times daily. Further refills per PCP 1 Inhaler 0  . nitroGLYCERIN (NITROSTAT) 0.4 MG SL tablet Place 1 tablet (0.4 mg total) under the tongue every 5 (five) minutes as needed for chest pain. 25 tablet  3  . sacubitril-valsartan (ENTRESTO) 49-51 MG Take 1 tablet by mouth 2 (two) times daily. 30 tablet 0  . spironolactone (ALDACTONE) 25 MG tablet Take 1 tablet (25 mg total) by mouth daily. 30 tablet 3   No current facility-administered medications for this encounter.     Vitals:   10/19/17 1151  BP: (!) 106/58  Pulse: 68  SpO2: 93%  Weight: 198 lb 12 oz (90.2 kg)    PHYSICAL  EXAM: General: NAD Neck: No JVD, no thyromegaly or thyroid nodule.  Lungs: Clear to auscultation bilaterally with normal respiratory effort. CV: Nondisplaced PMI.  Heart regular S1/S2, no S3/S4, no murmur.  No peripheral edema.  No carotid bruit.  Normal pedal pulses.  Abdomen: Soft, nontender, no hepatosplenomegaly, no distention.  Skin: Intact without lesions or rashes.  Neurologic: Alert and oriented x 3.  Psych: Normal affect. Extremities: No clubbing or cyanosis.  HEENT: Normal.   ASSESSMENT & PLAN: 1. Chronic systolic CHF: Ischemic cardiomyopathy. Medtronic ICD single chamber. 2/19 admission for decompensated HF requiring milrinone. ECHO 2/7/019 EF 15% Mildly dilated RV. CPX in 2/19 was suggestive of severe limitation due to HF.  Currently NYHA class II symptoms, seems to be doing better than would be predicted from CPX and recent admission.  He is not volume overloaded on exam and is not on a diuretic.  RHC in 4/19 showed normal filling pressures and preserved cardiac output.  - Continue Coreg 9.375 mg bid.  - Continue digoxin 0.125.  - Continue current Entresto, recent BMET ok.  - Increase spironolactone to 25 mg daily, BMET 10 days.   - He has a LBBB-like IVCD.  QRS 148.  Without true LBBB and without the QRS being markedly wide, I am not sure that he would benefit much from CRT upgrade.  Will see what Dr. Caryl Comes thinks.   - We have discussed LVAD with him.  However, with good cardiac output on recent RHC and NYHA class II symptoms (improved), would hold off for now.  Also need to figure out his  interstitial lung disease.  2. Atrial flutter: Paroxysmal.  Admitted in 2/19 with severe HF decompensation in setting of atrial flutter with RVR.  He was cardioverted.  He then had atrial flutter ablation in 3/19.  He is in NSR today.  - He is now off amiodarone (question of possible lung toxicity).   - Continue Eliquis 5 mg bid.  3. Atrial fibrillation: Noted post-op CABG.  4. CAD: S/p CABG.  Last intervention was PCI to LCx in 2016. No chest pain.  - Continue atorvastatin, good lipids in 3/19.  - No ASA with Eliquis use.  5. Pulmonary fibrosis: PFTs in 2/19 were restrictive, and high resolution CT showed interstitial lung disease. Amiodarone was stopped due to concern for possible toxicity.  He has seen [name] => amiodarone lung toxicity versus IPF. He has followup with [name].    6. OSA: Arrange appt with Dr. Radford Pax.  7. CKD: Stage 3. BMET in 10 days.  8. Pulmonary hypertension: Suspect group 3 PH due to lung disease (ILD).    Followup in 1 month in HF pharmacy clinic for med titration and 2 months with me.    Loralie Champagne  10/22/2017

## 2017-10-25 ENCOUNTER — Ambulatory Visit: Payer: Medicare HMO | Admitting: Internal Medicine

## 2017-10-25 ENCOUNTER — Encounter: Payer: Self-pay | Admitting: Internal Medicine

## 2017-10-25 VITALS — BP 96/60 | HR 68 | Ht 70.0 in | Wt 200.6 lb

## 2017-10-25 DIAGNOSIS — I255 Ischemic cardiomyopathy: Secondary | ICD-10-CM

## 2017-10-25 DIAGNOSIS — I5022 Chronic systolic (congestive) heart failure: Secondary | ICD-10-CM | POA: Diagnosis not present

## 2017-10-25 DIAGNOSIS — Z95810 Presence of automatic (implantable) cardiac defibrillator: Secondary | ICD-10-CM | POA: Diagnosis not present

## 2017-10-25 DIAGNOSIS — I4892 Unspecified atrial flutter: Secondary | ICD-10-CM

## 2017-10-25 LAB — CUP PACEART INCLINIC DEVICE CHECK
Battery Remaining Longevity: 120 mo
Battery Voltage: 3.01 V
HIGH POWER IMPEDANCE MEASURED VALUE: 63 Ohm
Implantable Lead Implant Date: 20160715
Implantable Lead Model: 293
Implantable Pulse Generator Implant Date: 20160715
Lead Channel Impedance Value: 456 Ohm
Lead Channel Pacing Threshold Amplitude: 0.75 V
Lead Channel Pacing Threshold Pulse Width: 0.4 ms
Lead Channel Setting Pacing Amplitude: 2.5 V
Lead Channel Setting Pacing Pulse Width: 0.4 ms
Lead Channel Setting Sensing Sensitivity: 0.3 mV
MDC IDC LEAD LOCATION: 753860
MDC IDC LEAD SERIAL: 382379
MDC IDC MSMT LEADCHNL RV IMPEDANCE VALUE: 437 Ohm
MDC IDC MSMT LEADCHNL RV SENSING INTR AMPL: 6.125 mV
MDC IDC MSMT LEADCHNL RV SENSING INTR AMPL: 6.75 mV
MDC IDC SESS DTM: 20190425151518
MDC IDC STAT BRADY RV PERCENT PACED: 0.03 %

## 2017-10-25 MED ORDER — FUROSEMIDE 40 MG PO TABS: 40.0000 mg | ORAL_TABLET | Freq: Every day | ORAL | 11 refills | Status: DC | PRN

## 2017-10-25 MED FILL — FUROSEMIDE 40 MG TAB: 40 | 30 days supply | Qty: 30 | Fill #0

## 2017-10-25 NOTE — Progress Notes (Signed)
Patient Care Team: [name], Alferd Apa, DO as PCP - General (Family Medicine) Burtis Junes, NP as Nurse Practitioner (Nurse Practitioner) Deboraha Sprang, MD as Consulting Physician (Cardiology) Allyn Kenner, MD (Dermatology)   HPI  Vincent Chandler is a 57 y.o. male Seen in followup for ischemic heart disease and sinus tachycardia. When he was seen last new Q waves were identified. There is a concern about an intercurrent myocardial infarction     DATE TEST EF   2012 Echo   25 %   2014/ LHC    % 3v CAD> CABG  2/16 Echo  20-25%   1/16 .LHC  PCI Cx  4/19 RHC  CI 2.51  PCWP 12           He was admitted 2/19 with congestive heart failure.  Echocardiogram EF 15% he was found to be in atrial flutter-typical.  He was in low output shock and required milrinone for support .  Given left ventricular low output, evaluation was initiated for LVAD  He underwent catheter ablation 3/19 for his atrial flutter although electrograms and suggested that there was also atrial fibrillation, it was thought that maybe the atrial fibrillation was secondary.  Feels better today and is now "swearing he will take his meds"  Does not want to have to go to LVAD  Overall feeling better without edema,  But weight up about 2 lbs  Saw Dr DM yday  Doing well.  I am amazing how many things     Past Medical History:  Diagnosis Date  . AICD (automatic cardioverter/defibrillator) present   . Anginal pain ([place])   . Anxiety   . Asthma   . Atrial fibrillation-postoperative 11/28/2012  . Cardiomyopathy    alcohol use related  . CHF (congestive heart failure) ([place])   . Chronic back pain   . Coronary artery disease    drug eluting stent RCA 2005-EF 30%- s/p CABG x 4; 2/4 patent grafts with SVG-PLOM and SVG-RCA system totally occluded. There are collaterals from the LAD system to the RCA and the RCA is totally occluded proximally. There are no collaterals to the LCx territory. He then underwent successful PCI  of the mid left circumflex artery with overlapping Synergy drug-eluting stents  . Depression   . Diabetes mellitus type II dx'd in the 1990's  . GERD (gastroesophageal reflux disease)    yrs ago  . H/O hiatal hernia   . History of hypogonadism   . History of kidney stones   . Hyperlipidemia   . Hypertension   . OSA on CPAP    "mask is broken; working on getting a new one" (01/15/2015; 10/03/2017)  . Pneumonia    "3-4 times" (10/03/2017)  . Urinary incontinence     Past Surgical History:  Procedure Laterality Date  . A-FLUTTER ABLATION N/A 09/12/2017   Procedure: A-FLUTTER ABLATION;  Surgeon: Deboraha Sprang, MD;  Location: [place] [facility];  Service: Cardiovascular;  Laterality: N/A;  . CARDIAC DEFIBRILLATOR PLACEMENT  01/15/2015  . CARDIOVERSION N/A 08/10/2017   Procedure: CARDIOVERSION;  Surgeon: Larey Dresser, MD;  Location: [facility] ENDOSCOPY;  Service: Cardiovascular;  Laterality: N/A;  . CORONARY ANGIOPLASTY WITH STENT PLACEMENT  ~ 2003  . CORONARY ARTERY BYPASS GRAFT N/A 08/15/2012   Procedure: CORONARY ARTERY BYPASS GRAFTING (CABG);  Surgeon: Ivin Poot, MD;  Location: [place];  Service: Open Heart Surgery;  Laterality: N/A;  Coronary Artery Bypass Grafting Times Four Using Left Internal Mammary Artery and Right  Saphenous Leg Vein Harvested Endoscopically  . EP IMPLANTABLE DEVICE N/A 01/15/2015   Procedure: ICD Implant;  Surgeon: Deboraha Sprang, MD;  Location: [place] [facility];  Service: Cardiovascular;  Laterality: N/A;  . LEFT HEART CATHETERIZATION WITH CORONARY ANGIOGRAM N/A 08/08/2012   Procedure: LEFT HEART CATHETERIZATION WITH CORONARY ANGIOGRAM;  Surgeon: Peter M Martinique, MD;  Location: [facility] CATH LAB;  Service: Cardiovascular;  Laterality: N/A;  . LEFT HEART CATHETERIZATION WITH CORONARY/GRAFT ANGIOGRAM N/A 07/21/2014   Procedure: LEFT HEART CATHETERIZATION WITH Beatrix Fetters;  Surgeon: Larey Dresser, MD;  Location: [facility] CATH LAB;  Service: Cardiovascular;  Laterality:  N/A;  . MULTIPLE EXTRACTIONS WITH ALVEOLOPLASTY N/A 10/04/2017   Procedure: Extraction of tooth #'s 5 and 14 with alveoloplasty and gross debridement of remaining teeth;  Surgeon: [name], DDS;  Location: [place];  Service: Oral Surgery;  Laterality: N/A;  . PERCUTANEOUS CORONARY STENT INTERVENTION (PCI-S)  07/21/2014   Procedure: PERCUTANEOUS CORONARY STENT INTERVENTION (PCI-S);  Surgeon: Larey Dresser, MD;  Location: [facility] CATH LAB;  Service: Cardiovascular;;  . REFRACTIVE SURGERY Bilateral 1990's  . RIGHT HEART CATH N/A 10/03/2017   Procedure: RIGHT HEART CATH;  Surgeon: Larey Dresser, MD;  Location: [place] [facility];  Service: Cardiovascular;  Laterality: N/A;  . TEE WITHOUT CARDIOVERSION N/A 08/10/2017   Procedure: TRANSESOPHAGEAL ECHOCARDIOGRAM (TEE);  Surgeon: Larey Dresser, MD;  Location[facility] ENDOSCOPY;  Service: Cardiovascular;  Laterality: N/A;  . TONSILLECTOMY  ~ 1970    Current Outpatient Medications  Medication Sig Dispense Refill  . ACCU-CHEK FASTCLIX LANCETS MISC Use as directed once a day.  Dx code: E11.9 100 each 1  . apixaban (ELIQUIS) 5 MG TABS tablet Take 1 tablet (5 mg total) by mouth 2 (two) times daily. 180 tablet 3  . atorvastatin (LIPITOR) 40 MG tablet Take 1 tablet (40 mg total) by mouth at bedtime.    . blood glucose meter kit and supplies Dispense based on patient and insurance preference. Use as directed once a day. Dx Code E11.9. 1 each 0  . Blood Glucose Monitoring Suppl (ACCU-CHEK GUIDE) w/Device KIT 1 each by Does not apply route daily. DX Code: E11.9 1 kit 0  . carvedilol (COREG) 6.25 MG tablet Take 1.5 tablets (9.375 mg total) by mouth 2 (two) times daily with a meal. 90 tablet 3  . digoxin (LANOXIN) 0.125 MG tablet Take 0.0625 mg by mouth daily.    Marland Kitchen glucose blood (ACCU-CHEK GUIDE) test strip Use as directed once a day.  Dx code: E11.9 100 each 1  . guaiFENesin (MUCINEX) 600 MG 12 hr tablet Take 1 tablet (600 mg total) by mouth 2 (two) times daily as  needed for to loosen phlegm.    . metFORMIN (GLUCOPHAGE) 500 MG tablet Take 1 tablet (500 mg total) by mouth 2 (two) times daily with a meal. 60 tablet 1  . mometasone-formoterol (DULERA) 200-5 MCG/ACT AERO Inhale 2 puffs into the lungs 2 (two) times daily. Further refills per PCP 1 Inhaler 0  . nitroGLYCERIN (NITROSTAT) 0.4 MG SL tablet Place 1 tablet (0.4 mg total) under the tongue every 5 (five) minutes as needed for chest pain. 25 tablet 3  . sacubitril-valsartan (ENTRESTO) 49-51 MG Take 1 tablet by mouth 2 (two) times daily. 30 tablet 0  . spironolactone (ALDACTONE) 25 MG tablet Take 1 tablet (25 mg total) by mouth daily. 30 tablet 3   No current facility-administered medications for this visit.     No Known Allergies  Review of Systems negative except from HPI and PMH  Physical Exam BP 96/60   Pulse 68   Ht _0  (1.778 m)   Wt 200 lb 9.6 oz (91 kg)   SpO2 96%   BMI 28.78 kg/m  Well developed and nourished in no acute distress HENT normal Neck supple with JVP-flat Clear Regular rate and rhythm, no murmurs or gallops Abd-soft with active BS No Clubbing cyanosis edema Skin-warm and dry A & Oriented  Grossly normal sensory and motor function People complain of result to be potentially drug  ECG demonstrates sinus @ 68 17/15/42 IVCD     Assessment and  Plan  ischemic cardiomyopathy  Congestive heart failure-chronic-systolic  3a  High Risk Medication Surveillance  ICD single chamber  Atrial flutter  Atrial fib post cabg  He has had no interval atrial fibrillation, but the high likelihood of atrial fibrillation I think justifies ongoing use of anticoagulation for 3-6 months.  His weight is up 2 pounds.  I have given him a diuretic furosemide 40 mg to take as needed if weight is up 2 pounds in a day or 5 pounds in a week.  Without symptoms of ischemia  His conduction abnormality is not consistent with left bundle branch block.  It is most consistent with an  IVCD and the QRS is less than 150 ms.  Hence, I am reluctant to undertake device generator upgrade at this point.

## 2017-10-25 NOTE — Patient Instructions (Signed)
Medication Instructions:  Your physician has recommended you make the following change in your medication:   1. Take Lasix, 45m tablet, as needed for swelling  Labwork: None ordered.  Testing/Procedures: None ordered.  Follow-Up: Your physician recommends that you schedule a follow-up appointment in:  4 months with Dr KCaryl Comes   Any Other Special Instructions Will Be Listed Below (If Applicable).     If you need a refill on your cardiac medications before your next appointment, please call your pharmacy.

## 2017-10-29 ENCOUNTER — Other Ambulatory Visit (HOSPITAL_COMMUNITY): Payer: Self-pay

## 2017-10-30 ENCOUNTER — Encounter: Payer: Self-pay | Admitting: Internal Medicine

## 2017-10-30 ENCOUNTER — Ambulatory Visit (INDEPENDENT_AMBULATORY_CARE_PROVIDER_SITE_OTHER): Payer: Medicare HMO | Admitting: Internal Medicine

## 2017-10-30 ENCOUNTER — Ambulatory Visit: Payer: Medicare HMO | Admitting: Internal Medicine

## 2017-10-30 ENCOUNTER — Ambulatory Visit (HOSPITAL_COMMUNITY)
Admission: RE | Admit: 2017-10-30 | Discharge: 2017-10-30 | Disposition: A | Discharge status: Home / Self Care | Payer: Medicare HMO | Source: Ambulatory Visit | Attending: Cardiology | Admitting: Cardiology

## 2017-10-30 ENCOUNTER — Telehealth: Payer: Self-pay | Admitting: Internal Medicine

## 2017-10-30 VITALS — BP 126/70 | HR 59 | Ht 69.0 in | Wt 198.6 lb

## 2017-10-30 DIAGNOSIS — I5022 Chronic systolic (congestive) heart failure: Secondary | ICD-10-CM | POA: Insufficient documentation

## 2017-10-30 DIAGNOSIS — J849 Interstitial pulmonary disease, unspecified: Secondary | ICD-10-CM

## 2017-10-30 DIAGNOSIS — R0600 Dyspnea, unspecified: Secondary | ICD-10-CM

## 2017-10-30 DIAGNOSIS — Z9229 Personal history of other drug therapy: Secondary | ICD-10-CM

## 2017-10-30 LAB — PULMONARY FUNCTION TEST
DL/VA % pred: 52 %
DL/VA: 2.38 ml/min/mmHg/L
DLCO COR: 9.16 ml/min/mmHg
DLCO UNC % PRED: 28 %
DLCO UNC: 8.81 ml/min/mmHg
DLCO cor % pred: 29 %
FEF 25-75 POST: 2.87 L/s
FEF 25-75 PRE: 2.59 L/s
FEF2575-%Change-Post: 10 %
FEF2575-%PRED-PRE: 84 %
FEF2575-%Pred-Post: 93 %
FEV1-%Change-Post: 1 %
FEV1-%PRED-POST: 66 %
FEV1-%PRED-PRE: 65 %
FEV1-POST: 2.39 L
FEV1-Pre: 2.35 L
FEV1FVC-%Change-Post: 0 %
FEV1FVC-%PRED-PRE: 110 %
FEV6-%CHANGE-POST: 1 %
FEV6-%PRED-PRE: 61 %
FEV6-%Pred-Post: 62 %
FEV6-POST: 2.81 L
FEV6-Pre: 2.76 L
FEV6FVC-%Change-Post: 0 %
FEV6FVC-%PRED-POST: 104 %
FEV6FVC-%Pred-Pre: 103 %
FVC-%Change-Post: 1 %
FVC-%Pred-Post: 60 %
FVC-%Pred-Pre: 59 %
FVC-Post: 2.84 L
FVC-Pre: 2.81 L
POST FEV6/FVC RATIO: 100 %
PRE FEV1/FVC RATIO: 84 %
Post FEV1/FVC ratio: 84 %
Pre FEV6/FVC Ratio: 99 %
RV % pred: 56 %
RV: 1.19 L
TLC % PRED: 56 %
TLC: 3.85 L

## 2017-10-30 LAB — BASIC METABOLIC PANEL
Anion gap: 6 (ref 5–15)
BUN: 19 mg/dL (ref 6–20)
CHLORIDE: 107 mmol/L (ref 101–111)
CO2: 25 mmol/L (ref 22–32)
CREATININE: 1.49 mg/dL — AB (ref 0.61–1.24)
Calcium: 9.1 mg/dL (ref 8.9–10.3)
GFR calc Af Amer: 59 mL/min — ABNORMAL LOW (ref >60)
GFR, EST NON AFRICAN AMERICAN: 51 mL/min — AB (ref >60)
GLUCOSE: 156 mg/dL — AB (ref 65–99)
POTASSIUM: 5.1 mmol/L (ref 3.5–5.1)
SODIUM: 138 mmol/L (ref 135–145)

## 2017-10-30 NOTE — Progress Notes (Signed)
PFT done today. 

## 2017-10-30 NOTE — Patient Instructions (Addendum)
ICD-10-CM   1. ILD (interstitial lung disease) ([place]) J84.9   2. History of amiodarone therapy Z92.29     I think there is still ILD ongoing Since last visit Might be stable v slightly worse; hard to figure out but clinically I think is ongoijng No clear cut cause Definitely not autoimmune Possibilities are IPF v amio drug toxicity  Plan - Surgical lung biopsy can be too risky - Recommend moderate sedation bronchoscopy with lavage without biopsy for cell count  - 50% chance we can make a diagnosis with this  0- will get clearance from Dr [name]  - will schedule sometime first 2 weeks may 2019  Followup  - 4-8 weeks in ILD clinic

## 2017-10-30 NOTE — Addendum Note (Signed)
Encounter addended by: Kerry Dory, CMA on: 10/30/2017 2:54 PM  Actions taken: Visit diagnoses modified, Order list changed, Diagnosis association updated, Sign clinical note

## 2017-10-30 NOTE — Telephone Encounter (Signed)
Hi [name]  Demian Stefanko\ stil has mild ILD ongoing. Might be worse v same since last month. Autoimmune negative. Like to do bronch BAL under moderate sedation with low EF and high PASP. Can  His heart handle sedation? He said he had no issues with teeth removal . If there is elevated risk then thoughts on committing him to empiric prednisone on presumption this is amio related  You can call me if you like  tHanks  Dr. Brand Males, M.D., [facility].C.P Pulmonary and Critical Care Medicine Staff Physician, Vivian Director - Interstitial Lung Disease  Program  Pulmonary [place] [facility] [place], [place], [address]  Pager: [phone], If no answer or between  15:00h - 7:00h: call [phone] Telephone: [phone]

## 2017-10-30 NOTE — Progress Notes (Signed)
[facility] unable to process/run lupus panel (QNS) Patient currently at [facility], will replace order and have labs repeated.

## 2017-10-30 NOTE — H&P (View-Only) (Signed)
Subjective:     Patient ID: Vincent Chandler, male   DOB: 08/06/1960, 57 y.o.   MRN: [id number]  HPI  PCP Vincent Chase, Yvonne R, DO  HPI  IOV 09/25/2017  Chief Complaint  Patient presents with  . Consult    Referred by Dr. [name] Chandler for ILD.  HRCT 3/19/189.  Pt denies any complaint of cough, SOB or CP.     57-year-old male with chronic systolic heart failure secondary to ischemic heart disease with chronic kidney disease and diabetes and obstructive sleep apnea not yet on CPAP treatment.  This concerned now that he has interstitial lung disease/pulmonary fibrosis not otherwise specified and therefore has been referred here by the cardiology heart failure team  History is gained from talking to the patient and review of the chart.  According to him he had coronary artery bypass grafting in 2014 that then left him with chronic systolic heart failure.  Then in 2016 he had a stent placed and had an admission for that (CT angio  At that time - effusion +/- interistial prominence).  After that he has been admission free up until early February 2019 when he was admitted for acute on chronic systolic heart failure.  During this time he was in atrial fibrillation and then according to his history was loaded with IV amiodarone [it appears he might of failed cardioversion] he was diuresed and discharged on oral amiodarone on August 17, 2017 after a 10-day hospital stay..  With the diuresis he continued to feel better and his shortness of breath and cough improved although he had significant improvement with shortness of breath more than cough.  Just prior to discharge from the hospital he had CT chest (non-HRCT that suggested ILD). Pulmonary function testing same day 08/16/17 that showed moderate restriction with significant severe reduction in diffusion capacity.  Then on August 20, 2017 he had a transesophageal echocardiogram with ejection fraction 15%.  This was then followed with a pulmonary stress  test on August 24, 2017 that showed a VO2 max of 12 with high risk heart failure features.  He did desaturate below 90%. Then on September 12, 2017 he had status post ablation for his atrial fibrillation and was able to come off the amiodarone.  Since coming off the amiodarone his cough is continued to improve and he relates his improvement in cough to stopping the amiodarone.  Then in 09/17/17 - > HRCT  - read by Thoracic radiology as Probable UIP (> 50-80% this is UIP; review of prior CT shows heart failure features - so this is new finding. Personally visualized CT).   And on 09/25/2017 => walk test - Walking desaturation test on 09/25/2017 185 feet x 3 laps on ROOM AIR:  did walk all 3 at normal pace. Did not have complaints. Didnot desaturate. Rest pulse ox was 98%, final pulse ox was 90%. HR response 72/min at rest to 88/min at peak exertion. Patient Vincent Chandler  Did no6 Desaturate < 88% . Vincent Chandler did yes  Desaturated </= 3% points. Vincent Chandler did not get tachyardic. These features are c/w MILD/EARLY ILD   Noted: he is considering for LVAD but CVTS feels he is high risk due to ILD and RV failure. Not clear if transplant is part of his goal   ACCP ILD question  - cough x 6 months. 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 No honeycombing inspiratory and expiratory  imaging no acute consolidative airspace disease. No pleural effusions. No suspicious appearing pulmonary nodules or masses are noted.  1. The appearance of the lungs is compatible with interstitial lung disease. The pattern is considered a probable usual interstitial pneumonia (UIP) CT pattern, however, at this time, no definitive honeycombing is identified. Outpatient referral to Pulmonology for further evaluation is suggested in the near future if not already obtained. Repeat high-resolution chest CT is recommended in 12 months to assess for temporal changes in the appearance of the lung parenchyma. 2. Aortic atherosclerosis, in addition to 3 vessel coronary artery disease. Patient is status post median sternotomy for CABG including LIMA to the LAD. 3. Additional incidental findings, as above.  Aortic Atherosclerosis (ICD10-I70.0).   Electronically Signed   By: Vincent  Chandler M.D.   OV 10/30/2017  Chief Complaint  Patient presents with  . Follow-up    PFT done today. Pt states he has been doing well since last visit and denies any complaints or concerns other than some mild back pain.     Walking desaturation test on 10/30/2017 185 feet x 3 laps on ROOM AIR:  did not  Desaturate to < 88%. Rest pulse ox was 97%, final pulse ox was 90%. HR response 60/min at rest to 70/min at peak exertion. Patient Vincent Chandler  Did not Desaturate < 88% . Vincent Chandler did yes  Desaturated </= 3% points. Vincent Chandler did not get tachyardic. Moderate pace . Denied complaints  Results for Vincent Chandler (MRN [id number]) as of 10/30/2017 17:01  Ref. Range 09/25/2017 17:03  ASPERGILLUS FUMIGATUS Latest Ref Range: NEGATIVE  NEGATIVE  [place]on Serum Latest Ref Range: NEGATIVE  NEGATIVE  Anit Nuclear Antibody(ANA) Latest Ref Range: Negative  Negative  Cyclic Citrullin Peptide Ab Latest Units: UNITS <16  ds DNA Ab Latest Units: IU/mL 5 (H)  Myeloperoxidase Abs Latest Units: AI <1.0  Serine Protease 3  Latest Units: AI 2.9 (H)  RA Latex Turbid. Latest Ref Range: <14 IU/mL <14  SSA (Ro) (ENA) Antibody, IgG Latest Ref Range: <1.0 NEG AI <1.0 NEG  SSB (La) (ENA) Antibody, IgG Latest Ref Range: <1.0 NEG AI <1.0 NEG  Scleroderma (Scl-70) (ENA) Antibody, IgG Latest Ref Range: <1.0 NEG AI <1.0 NEG   Results for Delorenzo, Lucy (MRN [id number]) as of 10/30/2017 17:01  Ref. Range 08/16/2017 14:34 10/30/2017 14:45  FVC-Pre Latest Units: L 2.89 2.81  FVC-%Pred-Pre Latest Units: % 61 59    Results for Sandberg, Brandin (MRN [id number]) as of 10/30/2017 17:01  Ref. Range 08/16/2017 14:34 10/30/2017 14:45  TLC Latest Units: L 4.09 3.85  TLC % pred Latest Units: % 60 56   Results for Jowett, Chrsitopher (MRN [id number]) as of 10/30/2017 17:01  Ref. Range 09/25/2017 17:03  Sed Rate Latest Ref Range: 0 - 20 mm/hr 18   RHC 10/03/2017  RA mean 5 RV 51/5 PA 54/18, mean 31 PCWP mean 12 Oxygen saturations: PA 66% AO 96% Cardiac Output (Fick) 5.22    Cardiac Index (Fick) 2.51 PVR 3.6 WU     has a past medical history of AICD (automatic cardioverter/defibrillator) present, Anginal pain (HCC), Anxiety, Asthma, Atrial fibrillation-postoperative (11/28/2012), Cardiomyopathy, CHF (congestive heart failure) (HCC), Chronic back pain, Coronary artery disease, Depression, Diabetes mellitus type II (dx'd in the 1990's), GERD (gastroesophageal reflux disease), H/O hiatal hernia, History of hypogonadism, History of kidney stones, Hyperlipidemia, Hypertension, OSA on CPAP, Pneumonia, and Urinary incontinence.   reports that he has never smoked. His smokeless tobacco use includes chew.  Past Surgical History:  Procedure Laterality Date  . A-FLUTTER ABLATION N/A 09/12/2017   Procedure: A-FLUTTER ABLATION;  Surgeon: Klein, Steven C, MD;  Location: [facility];  Service: Cardiovascular;  Laterality: N/A;  . CARDIAC DEFIBRILLATOR PLACEMENT  01/15/2015  . CARDIOVERSION N/A 08/10/2017   Procedure: CARDIOVERSION;  Surgeon: Chandler,  Vincent S, MD;  Location: [facility];  Service: Cardiovascular;  Laterality: N/A;  . CORONARY ANGIOPLASTY WITH STENT PLACEMENT  ~ 2003  . CORONARY ARTERY BYPASS GRAFT N/A 08/15/2012   Procedure: CORONARY ARTERY BYPASS GRAFTING (CABG);  Surgeon: Peter Van Trigt, MD;  Location: [facility] OR;  Service: Open Heart Surgery;  Laterality: N/A;  Coronary Artery Bypass Grafting Times Four Using Left Internal Mammary Artery and Right Saphenous Leg Vein Harvested Endoscopically  . EP IMPLANTABLE DEVICE N/A 01/15/2015   Procedure: ICD Implant;  Surgeon: Steven C Klein, MD;  Location: [facility];  Service: Cardiovascular;  Laterality: N/A;  . LEFT HEART CATHETERIZATION WITH CORONARY ANGIOGRAM N/A 08/08/2012   Procedure: LEFT HEART CATHETERIZATION WITH CORONARY ANGIOGRAM;  Surgeon: Peter M Jordan, MD;  Location: [facility];  Service: Cardiovascular;  Laterality: N/A;  . LEFT HEART CATHETERIZATION WITH CORONARY/GRAFT ANGIOGRAM N/A 07/21/2014   Procedure: LEFT HEART CATHETERIZATION WITH CORONARY/GRAFT ANGIOGRAM;  Surgeon: Vincent S McLean, MD;  Location: [facility];  Service: Cardiovascular;  Laterality: N/A;  . MULTIPLE EXTRACTIONS WITH ALVEOLOPLASTY N/A 10/04/2017   Procedure: Extraction of tooth #'s 5 and 14 with alveoloplasty and gross debridement of remaining teeth;  Surgeon: Kulinski, Ronald F, DDS;  Location: [facility] OR;  Service: Oral Surgery;  Laterality: N/A;  . PERCUTANEOUS CORONARY STENT INTERVENTION (PCI-S)  07/21/2014   Procedure: PERCUTANEOUS CORONARY STENT INTERVENTION (PCI-S);  Surgeon: Vincent S McLean, MD;  Location: [facility];  Service: Cardiovascular;;  . REFRACTIVE SURGERY Bilateral 1990's  . RIGHT HEART CATH N/A 10/03/2017   Procedure: RIGHT HEART CATH;  Surgeon: Chandler, Vincent S, MD;  Location: [facility];  Service: Cardiovascular;  Laterality: N/A;  . TEE WITHOUT CARDIOVERSION N/A 08/10/2017   Procedure: TRANSESOPHAGEAL ECHOCARDIOGRAM (TEE);  Surgeon: Chandler, Vincent S, MD;  Location: [facility]  END[facility];  Service: Cardiovascular;  Laterality: N/A;  . TONSILLECTOMY  ~ 1970    No Known Allergies  Immunization History  Administered Date(s) Administered  . Influenza Split 06/05/2012  . Influenza Whole 05/09/2010  . Influenza,inj,Quad PF,6+ Mos 04/09/2013, 06/05/2014, 08/17/2017  . Influenza-Unspecified 04/03/2015  . Pneumococcal Conjugate-13 06/05/2014  . Pneumococcal Polysaccharide-23 02/17/2012  . Td 05/09/2010    Family History  Problem Relation Age of Onset  . Hypertension Mother   . Diabetes Mother   . Hypertension Father   . Diabetes Father   . Hyperlipidemia Father      Current Outpatient Medications:  .  ACCU-CHEK FASTCLIX LANCETS MISC, Use as directed once a day.  Dx code: E11.9, Disp: 100 each, Rfl: 1 .  apixaban (ELIQUIS) 5 MG TABS tablet, Take 1 tablet (5 mg total) by mouth 2 (  two) times daily., Disp: 180 tablet, Rfl: 3 .  atorvastatin (LIPITOR) 40 MG tablet, Take 1 tablet (40 mg total) by mouth at bedtime., Disp: , Rfl:  .  blood glucose meter kit and supplies, Dispense based on patient and insurance preference. Use as directed once a day. Dx Code E11.9., Disp: 1 each, Rfl: 0 .  Blood Glucose Monitoring Suppl (ACCU-CHEK GUIDE) w/Device KIT, 1 each by Does not apply route daily. DX Code: E11.9, Disp: 1 kit, Rfl: 0 .  carvedilol (COREG) 6.25 MG tablet, Take 1.5 tablets (9.375 mg total) by mouth 2 (two) times daily with a meal., Disp: 90 tablet, Rfl: 3 .  digoxin (LANOXIN) 0.125 MG tablet, Take 0.0625 mg by mouth daily., Disp: , Rfl:  .  furosemide (LASIX) 40 MG tablet, Take 1 tablet (40 mg total) by mouth daily as needed., Disp: 30 tablet, Rfl: 11 .  glucose blood (ACCU-CHEK GUIDE) test strip, Use as directed once a day.  Dx code: E11.9, Disp: 100 each, Rfl: 1 .  guaiFENesin (MUCINEX) 600 MG 12 hr tablet, Take 1 tablet (600 mg total) by mouth 2 (two) times daily as needed for to loosen phlegm., Disp: , Rfl:  .  metFORMIN (GLUCOPHAGE) 500 MG tablet, Take 1  tablet (500 mg total) by mouth 2 (two) times daily with a meal., Disp: 60 tablet, Rfl: 1 .  mometasone-formoterol (DULERA) 200-5 MCG/ACT AERO, Inhale 2 puffs into the lungs 2 (two) times daily. Further refills per PCP, Disp: 1 Inhaler, Rfl: 0 .  sacubitril-valsartan (ENTRESTO) 49-51 MG, Take 1 tablet by mouth 2 (two) times daily., Disp: 30 tablet, Rfl: 0 .  spironolactone (ALDACTONE) 25 MG tablet, Take 1 tablet (25 mg total) by mouth daily., Disp: 30 tablet, Rfl: 3 .  nitroGLYCERIN (NITROSTAT) 0.4 MG SL tablet, Place 1 tablet (0.4 mg total) under the tongue every 5 (five) minutes as needed for chest pain. (Patient not taking: Reported on 10/30/2017), Disp: 25 tablet, Rfl: 3     Review of Systems     Objective:   Physical Exam  Constitutional: He is oriented to person, place, and time. He appears well-developed and well-nourished. No distress.  HENT:  Head: Normocephalic and atraumatic.  Right Ear: External ear normal.  Left Ear: External ear normal.  Mouth/Throat: Oropharynx is clear and moist. No oropharyngeal exudate.  Eyes: Pupils are equal, round, and reactive to light. Conjunctivae and EOM are normal. Right eye exhibits no discharge. Left eye exhibits no discharge. No scleral icterus.  Neck: Normal range of motion. Neck supple. No JVD present. No tracheal deviation present. No thyromegaly present.  Cardiovascular: Normal rate, regular rhythm and intact distal pulses. Exam reveals no gallop and no friction rub.  No murmur heard. Pulmonary/Chest: Effort normal and breath sounds normal. No respiratory distress. He has no wheezes. He has no rales. He exhibits no tenderness.  Very mild basal crackles only  Abdominal: Soft. Bowel sounds are normal. He exhibits no distension and no mass. There is no tenderness. There is no rebound and no guarding.  Musculoskeletal: Normal range of motion. He exhibits no edema or tenderness.  Lymphadenopathy:    He has no cervical adenopathy.  Neurological:  He is alert and oriented to person, place, and time. He has normal reflexes. No cranial nerve deficit. Coordination normal.  Skin: Skin is warm and dry. No rash noted. He is not diaphoretic. No erythema. No pallor.  Psychiatric: He has a normal mood and affect. His behavior is normal. Judgment and thought content  normal.  Nursing note and vitals reviewed.  Today's Vitals   10/30/17 1603  BP: 126/70  Pulse: (!) 59  SpO2: 96%  Weight: 198 lb 9.6 oz (90.1 kg)  Height: 5' 9" (1.753 m)    Estimated body mass index is 29.33 kg/m as calculated from the following:   Height as of this encounter: 5' 9" (1.753 m).   Weight as of this encounter: 198 lb 9.6 oz (90.1 kg).      Assessment:       ICD-10-CM   1. ILD (interstitial lung disease) (HCC) J84.9   2. History of amiodarone therapy Z92.29        Plan:      I think there is still ILD ongoing Since last visit Might be stable v slightly worse (walk test same, symptoms better, pft worse); hard to figure out but clinically I think is definitely ongoijng No clear  Cause. .Definitely not autoimmune. Possibilities are IPF v amio drug toxicity  Plan - Surgical lung biopsy can be too risky so avoid - Recommend moderate sedation bronchoscopy with lavage without biopsy for cell count  - 50% chance we can make a diagnosis with this  0- will get clearance from Dr Chandler  - will schedule sometime first 2 weeks may 2019  - if this is risky, consider empiric steroid trial  Followup  - 4-8 weeks in ILD clinic    > 50% of this > 25 min visit spent in face to face counseling or coordination of care    Dr.  , M.D., F.C.C.P Pulmonary and Critical Care Medicine Staff Physician, [facility] Director - Interstitial Lung Disease  Program  [facility] [place], [place], [address]  Pager: [phone], If no answer or between  15:00h - 7:00h: call [phone]   [phone] Telephone: [phone]       

## 2017-10-30 NOTE — Progress Notes (Signed)
Subjective:     Patient ID: Vincent Chandler, male   DOB: September 29, 1960, 57 y.o.   MRN: [id number]  HPI  PCP Vincent Chandler, Vincent Apa, DO  HPI  IOV 09/25/2017  Chief Complaint  Patient presents with  . Consult    Referred by Dr. Loralie Chandler for ILD.  HRCT 3/19/189.  Pt denies any complaint of cough, SOB or CP.     57 year old male with chronic systolic heart failure secondary to ischemic heart disease with chronic kidney disease and diabetes and obstructive sleep apnea not yet on CPAP treatment.  This concerned now that he has interstitial lung disease/pulmonary fibrosis not otherwise specified and therefore has been referred here by the cardiology heart failure team  History is gained from talking to the patient and review of the chart.  According to him he had coronary artery bypass grafting in 2014 that then left him with chronic systolic heart failure.  Then in 2016 he had a stent placed and had an admission for that (CT angio  At that time - effusion +/- interistial prominence).  After that he has been admission free up until early February 2019 when he was admitted for acute on chronic systolic heart failure.  During this time he was in atrial fibrillation and then according to his history was loaded with IV amiodarone [it appears he might of failed cardioversion] he was diuresed and discharged on oral amiodarone on August 17, 2017 after a 10-day hospital stay..  With the diuresis he continued to feel better and his shortness of breath and cough improved although he had significant improvement with shortness of breath more than cough.  Just prior to discharge from the hospital he had CT chest (non-HRCT that suggested ILD). Pulmonary function testing same day 08/16/17 that showed moderate restriction with significant severe reduction in diffusion capacity.  Then on August 20, 2017 he had a transesophageal echocardiogram with ejection fraction 15%.  This was then followed with a pulmonary stress  test on August 24, 2017 that showed a VO2 max of 12 with high risk heart failure features.  He did desaturate below 90%. Then on September 12, 2017 he had status post ablation for his atrial fibrillation and was able to come off the amiodarone.  Since coming off the amiodarone his cough is continued to improve and he relates his improvement in cough to stopping the amiodarone.  Then in 09/17/17 - > HRCT  - read by Thoracic radiology as Probable UIP (> 50-80% this is UIP; review of prior CT shows heart failure features - so this is new finding. Personally visualized CT).   And on 09/25/2017 => walk test - Walking desaturation test on 09/25/2017 185 feet x 3 laps on ROOM AIR:  did walk all 3 at normal pace. Did not have complaints. Didnot desaturate. Rest pulse ox was 98%, final pulse ox was 90%. HR response 72/min at rest to 88/min at peak exertion. Patient Vincent Chandler  Did no6 Desaturate < 88% . Vincent Chandler did yes  Desaturated </= 3% points. Vincent Chandler did not get tachyardic. These features are c/w MILD/EARLY ILD   Noted: he is considering for LVAD but CVTS feels he is high risk due to ILD and RV failure. Not clear if transplant is part of his goal   ACCP ILD question  - cough x 6 months. 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 No honeycombing inspiratory and expiratory  imaging no acute consolidative airspace disease. No pleural effusions. No suspicious appearing pulmonary nodules or masses are noted.  1. The appearance of the lungs is compatible with interstitial lung disease. The pattern is considered a probable usual interstitial pneumonia (UIP) CT pattern, however, at this time, no definitive honeycombing is identified. Outpatient referral to Pulmonology for further evaluation is suggested in the near future if not already obtained. Repeat high-resolution chest CT is recommended in 12 months to assess for temporal changes in the appearance of the lung parenchyma. 2. Aortic atherosclerosis, in addition to 3 vessel coronary artery disease. Patient is status post median sternotomy for CABG including [name] 3. Additional incidental findings, as above.  Aortic Atherosclerosis (ICD10-I70.0).   Electronically Signed   By: Vincent Chandler M.D.   Vincent Chandler 10/30/2017  Chief Complaint  Patient presents with  . Follow-up    PFT done today. Pt states he has been doing well since last visit and denies any complaints or concerns other than some mild back pain.     Walking desaturation test on 10/30/2017 185 feet x 3 laps on ROOM AIR:  did not  Desaturate to < 88%. Rest pulse ox was 97%, final pulse ox was 90%. HR response 60/min at rest to 70/min at peak exertion. Patient Vincent Chandler  Did not Desaturate < 88% . Vincent Chandler did yes  Desaturated </= 3% points. Vincent Chandler did not get tachyardic. Moderate pace . Denied complaints  Results for Vincent Chandler (MRN [id number]) as of 10/30/2017 17:01  Ref. Range 09/25/2017 17:03  ASPERGILLUS FUMIGATUS Latest Ref Range: NEGATIVE  NEGATIVE  [place] Serum Latest Ref Range: NEGATIVE  NEGATIVE  Anit Nuclear Antibody(ANA) Latest Ref Range: Negative  Negative  Cyclic Citrullin Peptide Ab Latest Units: UNITS <16  ds DNA Ab Latest Units: IU/mL 5 (H)  Myeloperoxidase Abs Latest Units: AI <1.0  Serine Protease 3  Latest Units: AI 2.9 (H)  RA Latex Turbid. Latest Ref Range: <14 IU/mL <14  SSA (Ro) (ENA) Antibody, IgG Latest Ref Range: <1.0 NEG AI <1.0 NEG  SSB (La) (ENA) Antibody, IgG Latest Ref Range: <1.0 NEG AI <1.0 NEG  Scleroderma (Scl-70) (ENA) Antibody, IgG Latest Ref Range: <1.0 NEG AI <1.0 NEG   Results for BRIXTON, FRANKO (MRN [id number]) as of 10/30/2017 17:01  Ref. Range 08/16/2017 14:34 10/30/2017 14:45  FVC-Pre Latest Units: L 2.89 2.81  FVC-%Pred-Pre Latest Units: % 61 59    Results for JAQUELL, SEDDON (MRN [id number]) as of 10/30/2017 17:01  Ref. Range 08/16/2017 14:34 10/30/2017 14:45  TLC Latest Units: L 4.09 3.85  TLC % pred Latest Units: % 60 56   Results for DAMARION, MENDIZABAL (MRN [id number]) as of 10/30/2017 17:01  Ref. Range 09/25/2017 17:03  Sed Rate Latest Ref Range: 0 - 20 mm/hr 18   RHC 10/03/2017  RA mean 5 RV 51/5 PA 54/18, mean 31 PCWP mean 12 Oxygen saturations: PA 66% AO 96% Cardiac Output (Fick) 5.22  Cardiac Index (Fick) 2.51 PVR 3.6 WU     has a past medical history of AICD (automatic cardioverter/defibrillator) present, Anginal pain ([place]), Anxiety, Asthma, Atrial fibrillation-postoperative (11/28/2012), Cardiomyopathy, CHF (congestive heart failure) ([place]), Chronic back pain, Coronary artery disease, Depression, Diabetes mellitus type II (dx'd in the 1990's), GERD (gastroesophageal reflux disease), H/O hiatal hernia, History of hypogonadism, History of kidney stones, Hyperlipidemia, Hypertension, OSA on CPAP, Pneumonia, and Urinary incontinence.   reports that he has never smoked. His smokeless tobacco use includes chew.  Past Surgical History:  Procedure Laterality Date  . A-FLUTTER ABLATION N/A 09/12/2017   Procedure: A-FLUTTER ABLATION;  Surgeon: Deboraha Sprang, MD;  Location: [facility];  Service: Cardiovascular;  Laterality: N/A;  . CARDIAC DEFIBRILLATOR PLACEMENT  01/15/2015  . CARDIOVERSION N/A 08/10/2017   Procedure: CARDIOVERSION;  Surgeon: Larey Dresser, MD;  Location: [facility] ENDOSCOPY;  Service: Cardiovascular;  Laterality: N/A;  . CORONARY ANGIOPLASTY WITH STENT PLACEMENT  ~ 2003  . CORONARY ARTERY BYPASS GRAFT N/A 08/15/2012   Procedure: CORONARY ARTERY BYPASS GRAFTING (CABG);  Surgeon: Ivin Poot, MD;  Location: [facility];  Service: Open Heart Surgery;  Laterality: N/A;  Coronary Artery Bypass Grafting Times Four Using Left Internal Mammary Artery and Right Saphenous Leg Vein Harvested Endoscopically  . EP IMPLANTABLE DEVICE N/A 01/15/2015   Procedure: ICD Implant;  Surgeon: Deboraha Sprang, MD;  Location: [place] [facility];  Service: Cardiovascular;  Laterality: N/A;  . LEFT HEART CATHETERIZATION WITH CORONARY ANGIOGRAM N/A 08/08/2012   Procedure: LEFT HEART CATHETERIZATION WITH CORONARY ANGIOGRAM;  Surgeon: Peter M Martinique, MD;  Location: [facility] CATH LAB;  Service: Cardiovascular;  Laterality: N/A;  . LEFT HEART CATHETERIZATION WITH CORONARY/GRAFT ANGIOGRAM N/A 07/21/2014   Procedure: LEFT HEART CATHETERIZATION WITH Beatrix Fetters;  Surgeon: Larey Dresser, MD;  Location: [facility] CATH LAB;  Service: Cardiovascular;  Laterality: N/A;  . MULTIPLE EXTRACTIONS WITH ALVEOLOPLASTY N/A 10/04/2017   Procedure: Extraction of tooth #'s 5 and 14 with alveoloplasty and gross debridement of remaining teeth;  Surgeon: [name], DDS;  Location: [place] [facility];  Service: Oral Surgery;  Laterality: N/A;  . PERCUTANEOUS CORONARY STENT INTERVENTION (PCI-S)  07/21/2014   Procedure: PERCUTANEOUS CORONARY STENT INTERVENTION (PCI-S);  Surgeon: Larey Dresser, MD;  Location: [facility] CATH LAB;  Service: Cardiovascular;;  . REFRACTIVE SURGERY Bilateral 1990's  . RIGHT HEART CATH N/A 10/03/2017   Procedure: RIGHT HEART CATH;  Surgeon: Larey Dresser, MD;  Location: [place] LAB;  Service: Cardiovascular;  Laterality: N/A;  . TEE WITHOUT CARDIOVERSION N/A 08/10/2017   Procedure: TRANSESOPHAGEAL ECHOCARDIOGRAM (TEE);  Surgeon: Larey Dresser, MD;  Location: [facility]  ENDOSCOPY;  Service: Cardiovascular;  Laterality: N/A;  . TONSILLECTOMY  ~ 1970    No Known Allergies  Immunization History  Administered Date(s) Administered  . Influenza Split 06/05/2012  . Influenza Whole 05/09/2010  . Influenza,inj,Quad PF,6+ Mos 04/09/2013, 06/05/2014, 08/17/2017  . Influenza-Unspecified 04/03/2015  . Pneumococcal Conjugate-13 06/05/2014  . Pneumococcal Polysaccharide-23 02/17/2012  . Td 05/09/2010    Family History  Problem Relation Age of Onset  . Hypertension Mother   . Diabetes Mother   . Hypertension Father   . Diabetes Father   . Hyperlipidemia Father      Current Outpatient Medications:  .  ACCU-CHEK FASTCLIX LANCETS MISC, Use as directed once a day.  Dx code: E11.9, Disp: 100 each, Rfl: 1 .  apixaban (ELIQUIS) 5 MG TABS tablet, Take 1 tablet (5 mg total) by mouth 2 (  two) times daily., Disp: 180 tablet, Rfl: 3 .  atorvastatin (LIPITOR) 40 MG tablet, Take 1 tablet (40 mg total) by mouth at bedtime., Disp: , Rfl:  .  blood glucose meter kit and supplies, Dispense based on patient and insurance preference. Use as directed once a day. Dx Code E11.9., Disp: 1 each, Rfl: 0 .  Blood Glucose Monitoring Suppl (ACCU-CHEK GUIDE) w/Device KIT, 1 each by Does not apply route daily. DX Code: E11.9, Disp: 1 kit, Rfl: 0 .  carvedilol (COREG) 6.25 MG tablet, Take 1.5 tablets (9.375 mg total) by mouth 2 (two) times daily with a meal., Disp: 90 tablet, Rfl: 3 .  digoxin (LANOXIN) 0.125 MG tablet, Take 0.0625 mg by mouth daily., Disp: , Rfl:  .  furosemide (LASIX) 40 MG tablet, Take 1 tablet (40 mg total) by mouth daily as needed., Disp: 30 tablet, Rfl: 11 .  glucose blood (ACCU-CHEK GUIDE) test strip, Use as directed once a day.  Dx code: E11.9, Disp: 100 each, Rfl: 1 .  guaiFENesin (MUCINEX) 600 MG 12 hr tablet, Take 1 tablet (600 mg total) by mouth 2 (two) times daily as needed for to loosen phlegm., Disp: , Rfl:  .  metFORMIN (GLUCOPHAGE) 500 MG tablet, Take 1  tablet (500 mg total) by mouth 2 (two) times daily with a meal., Disp: 60 tablet, Rfl: 1 .  mometasone-formoterol (DULERA) 200-5 MCG/ACT AERO, Inhale 2 puffs into the lungs 2 (two) times daily. Further refills per PCP, Disp: 1 Inhaler, Rfl: 0 .  sacubitril-valsartan (ENTRESTO) 49-51 MG, Take 1 tablet by mouth 2 (two) times daily., Disp: 30 tablet, Rfl: 0 .  spironolactone (ALDACTONE) 25 MG tablet, Take 1 tablet (25 mg total) by mouth daily., Disp: 30 tablet, Rfl: 3 .  nitroGLYCERIN (NITROSTAT) 0.4 MG SL tablet, Place 1 tablet (0.4 mg total) under the tongue every 5 (five) minutes as needed for chest pain. (Patient not taking: Reported on 10/30/2017), Disp: 25 tablet, Rfl: 3     Review of Systems     Objective:   Physical Exam  Constitutional: He is oriented to person, place, and time. He appears well-developed and well-nourished. No distress.  HENT:  Head: Normocephalic and atraumatic.  Right Ear: External ear normal.  Left Ear: External ear normal.  Mouth/Throat: Oropharynx is clear and moist. No oropharyngeal exudate.  Eyes: Pupils are equal, round, and reactive to light. Conjunctivae and EOM are normal. Right eye exhibits no discharge. Left eye exhibits no discharge. No scleral icterus.  Neck: Normal range of motion. Neck supple. No JVD present. No tracheal deviation present. No thyromegaly present.  Cardiovascular: Normal rate, regular rhythm and intact distal pulses. Exam reveals no gallop and no friction rub.  No murmur heard. Pulmonary/Chest: Effort normal and breath sounds normal. No respiratory distress. He has no wheezes. He has no rales. He exhibits no tenderness.  Very mild basal crackles only  Abdominal: Soft. Bowel sounds are normal. He exhibits no distension and no mass. There is no tenderness. There is no rebound and no guarding.  Musculoskeletal: Normal range of motion. He exhibits no edema or tenderness.  Lymphadenopathy:    He has no cervical adenopathy.  Neurological:  He is alert and oriented to person, place, and time. He has normal reflexes. No cranial nerve deficit. Coordination normal.  Skin: Skin is warm and dry. No rash noted. He is not diaphoretic. No erythema. No pallor.  Psychiatric: He has a normal mood and affect. His behavior is normal. Judgment and thought content  normal.  Nursing note and vitals reviewed.  Today's Vitals   10/30/17 1603  BP: 126/70  Pulse: (!) 59  SpO2: 96%  Weight: 198 lb 9.6 oz (90.1 kg)  Height: _0  (1.753 m)    Estimated body mass index is 29.33 kg/m as calculated from the following:   Height as of this encounter: _1  (1.753 m).   Weight as of this encounter: 198 lb 9.6 oz (90.1 kg).      Assessment:       ICD-10-CM   1. ILD (interstitial lung disease) ([place]) J84.9   2. History of amiodarone therapy Z92.29        Plan:      I think there is still ILD ongoing Since last visit Might be stable v slightly worse (walk test same, symptoms better, pft worse); hard to figure out but clinically I think is definitely ongoijng No clear  Cause. Marland KitchenDefinitely not autoimmune. Possibilities are IPF v amio drug toxicity  Plan - Surgical lung biopsy can be too risky so avoid - Recommend moderate sedation bronchoscopy with lavage without biopsy for cell count  - 50% chance we can make a diagnosis with this  0- will get clearance from Dr [name]  - will schedule sometime first 2 weeks may 2019  - if this is risky, consider empiric steroid trial  Followup  - 4-8 weeks in ILD clinic    > 50% of this > 25 min visit spent in face to face counseling or coordination of care    Dr. Brand Males, M.D., [facility].C.P Pulmonary and Critical Care Medicine Staff Physician, [place] Director - Interstitial Lung Disease  Program  Pulmonary [place] at [place], [place], [address]  Pager: [phone], If no answer or between  15:00h - 7:00h: call [phone] Telephone: [phone]

## 2017-10-31 ENCOUNTER — Other Ambulatory Visit: Payer: Self-pay

## 2017-10-31 NOTE — Telephone Encounter (Signed)
I think he would be acceptable risk for moderate sedation.  He has been stable recently with minimal symptoms.  I would be ok with him on prednisone, we'll just have to follow his volume status.

## 2017-10-31 NOTE — Telephone Encounter (Signed)
Given concerns with volume status on prednisone and in undifferentiated ILD - > then will consider bronch/BAL with moderate sedation   Thanks  Dr. Brand Males, M.D., [facility].C.P Pulmonary and Critical Care Medicine Staff Physician, [place] Director - Interstitial Lung Disease  Program  Pulmonary [place] at [place], [place], [address]  Pager: [phone], If no answer or between  15:00h - 7:00h: call [phone] Telephone: [phone]

## 2017-10-31 NOTE — Telephone Encounter (Signed)
MR this was sent to pulmonary triage and not [name]- please advise on what is needed from this office regarding this message.  Thanks.

## 2017-10-31 NOTE — Telephone Encounter (Signed)
HI Vincent Chandler  Please see below  THans  Dr. Brand Males, M.D., [facility].C.P Pulmonary and Critical Care Medicine Staff Physician, [place] Director - Interstitial Lung Disease  Program  Pulmonary [place] at [place], [place], [address]  Pager: [phone], If no answer or between  15:00h - 7:00h: call [phone] Telephone: [phone]

## 2017-11-01 ENCOUNTER — Other Ambulatory Visit (HOSPITAL_COMMUNITY): Payer: Self-pay

## 2017-11-01 MED FILL — SPIRONOLACTONE 25 MG TABLET: 25 | 30 days supply | Qty: 30 | Fill #0

## 2017-11-01 MED FILL — CARVEDILOL 6.25 MG TABLET: 6.25 | 30 days supply | Qty: 90 | Fill #0

## 2017-11-01 NOTE — Progress Notes (Signed)
Paramedicine Encounter    Patient ID: Vincent Chandler, male    DOB: 08/20/1960, 57 y.o.   MRN: [id number]   Patient Care Team: [name], Alferd Apa, DO as PCP - General (Family Medicine) Burtis Junes, NP as Nurse Practitioner (Nurse Practitioner) Deboraha Sprang, MD as Consulting Physician (Cardiology) Allyn Kenner, MD (Dermatology)  Patient Active Problem List   Diagnosis Date Noted  . Acute on chronic systolic heart failure ([place]) 10/03/2017  . Dental caries 08/30/2017  . Chronic periodontitis 08/30/2017  . Atrial flutter ([place]) 08/08/2017  . Dyspnea 08/07/2017  . Chronic systolic heart failure ([place]) 08/07/2017  . Hyperglycemia 08/07/2017  . Lower respiratory infection 08/07/2017  . Screening for colon cancer 08/07/2017  . CKD (chronic kidney disease) 2-3 07/19/2014  . NSTEMI (non-ST elevated myocardial infarction) ([place]) 07/18/2014  . OSA (obstructive sleep apnea) 07/18/2014  . Tachycardia   . V tach ([place]) 07/17/2014  . Hypoxemia 07/17/2014  . CHF (congestive heart failure) ([place]) 06/04/2012  . Sinus tachycardia 12/29/2010  . Coronary artery disease prior RCA stent with new inferior Q waves 10/18/2010  . Ischemic and nonischemic cardiomyopathy   10/18/2010  . Uncontrolled type 2 diabetes mellitus without complication, without long-term current use of insulin ([place]) 03/10/2010  . Hyperlipidemia 03/10/2010  . Essential hypertension 03/10/2010    Current Outpatient Medications:  .  ACCU-CHEK FASTCLIX LANCETS MISC, Use as directed once a day.  Dx code: E11.9, Disp: 100 each, Rfl: 1 .  apixaban (ELIQUIS) 5 MG TABS tablet, Take 1 tablet (5 mg total) by mouth 2 (two) times daily., Disp: 180 tablet, Rfl: 3 .  atorvastatin (LIPITOR) 40 MG tablet, Take 1 tablet (40 mg total) by mouth at bedtime., Disp: , Rfl:  .  blood glucose meter kit and supplies, Dispense based on patient and insurance preference. Use as directed once a day. Dx Code E11.9., Disp: 1 each, Rfl: 0 .  Blood Glucose  Monitoring Suppl (ACCU-CHEK GUIDE) w/Device KIT, 1 each by Does not apply route daily. DX Code: E11.9, Disp: 1 kit, Rfl: 0 .  carvedilol (COREG) 6.25 MG tablet, Take 1.5 tablets (9.375 mg total) by mouth 2 (two) times daily with a meal., Disp: 90 tablet, Rfl: 3 .  digoxin (LANOXIN) 0.125 MG tablet, Take 0.0625 mg by mouth daily., Disp: , Rfl:  .  glucose blood (ACCU-CHEK GUIDE) test strip, Use as directed once a day.  Dx code: E11.9, Disp: 100 each, Rfl: 1 .  metFORMIN (GLUCOPHAGE) 500 MG tablet, Take 1 tablet (500 mg total) by mouth 2 (two) times daily with a meal., Disp: 60 tablet, Rfl: 1 .  mometasone-formoterol (DULERA) 200-5 MCG/ACT AERO, Inhale 2 puffs into the lungs 2 (two) times daily. Further refills per PCP, Disp: 1 Inhaler, Rfl: 0 .  sacubitril-valsartan (ENTRESTO) 49-51 MG, Take 1 tablet by mouth 2 (two) times daily., Disp: 30 tablet, Rfl: 0 .  spironolactone (ALDACTONE) 25 MG tablet, Take 1 tablet (25 mg total) by mouth daily., Disp: 30 tablet, Rfl: 3 .  furosemide (LASIX) 40 MG tablet, Take 1 tablet (40 mg total) by mouth daily as needed. (Patient not taking: Reported on 11/01/2017), Disp: 30 tablet, Rfl: 11 .  guaiFENesin (MUCINEX) 600 MG 12 hr tablet, Take 1 tablet (600 mg total) by mouth 2 (two) times daily as needed for to loosen phlegm. (Patient not taking: Reported on 11/01/2017), Disp: , Rfl:  .  nitroGLYCERIN (NITROSTAT) 0.4 MG SL tablet, Place 1 tablet (0.4 mg total) under the tongue every 5 (five)  minutes as needed for chest pain. (Patient not taking: Reported on 10/30/2017), Disp: 25 tablet, Rfl: 3 No Known Allergies    Social History   Socioeconomic History  . Marital status: Divorced    Spouse name: n/a  . Number of children: 0  . Years of education: 12th grade  . Highest education level: Not on file  Occupational History  . Occupation: Data processing manager ---self employed    Employer: [employer]  Social Needs  . Financial resource strain: Not  on file  . Food insecurity:    Worry: Not on file    Inability: Not on file  . Transportation needs:    Medical: Not on file    Non-medical: Not on file  Tobacco Use  . Smoking status: Never Smoker  . Smokeless tobacco: Current User    Types: Chew  Substance and Sexual Activity  . Alcohol use: Yes    Alcohol/week: 1.8 oz    Types: 3 Glasses of wine per week  . Drug use: No  . Sexual activity: Not Currently    Partners: Female  Lifestyle  . Physical activity:    Days per week: Not on file    Minutes per session: Not on file  . Stress: Not on file  Relationships  . Social connections:    Talks on phone: Not on file    Gets together: Not on file    Attends religious service: Not on file    Active member of club or organization: Not on file    Attends meetings of clubs or organizations: Not on file    Relationship status: Not on file  . Intimate partner violence:    Fear of current or ex partner: Not on file    Emotionally abused: Not on file    Physically abused: Not on file    Forced sexual activity: Not on file  Other Topics Concern  . Not on file  Social History Narrative   Exercise-- walking    Lives alone.   Brother lives in [place], [place]    Physical Exam      Future Appointments  Date Time Provider [place]  11/08/2017 11:15 AM Etter Sjogren Koren Shiver, DO [facility]  11/19/2017  1:15 PM [facility] PHARMACY [facility] None  11/20/2017  2:00 PM Sueanne Margarita, MD [facility]  12/11/2017  2:00 PM Brand Males, MD [facility] None  12/19/2017  9:40 AM [facility] DEVICE REMOTES [facility]  01/07/2018 11:00 AM Larey Dresser, MD [facility] None  03/18/2018  1:45 PM Deboraha Sprang, MD [facility]    BP 112/70   Pulse 60   Resp 15   Wt 198 lb (89.8 kg)   SpO2 98%   BMI 29.24 kg/m   Weight yesterday-198 Last visit weight-197 [facility]-106  Pt reports he is doing well, at last clinic visit there were some med  changes-his carvedilol was increased and spiro was increased.  Dr Caryl Comes added lasix for him to take as needed-his weight has been is back down to 198- He has been taking a whole tab of digoxin-in the clinic note it said for him to take .0172m of dig but then in the med list it reports for him to take half tab of it.  Will need to clarify with clinic-- Pt reports no sob, he states he is having orthostatic dizziness-probably from the carvedilol increase-- He reports he has been having diarrhea for past 2 days. Called in to pharmacy to  get his new dose of spiro and carvedilol ready and pt will p/u tomor.  Doroteo Bradford will f/u tomor about the digoxin dose and get back with pt. No orthostatic changes today with b/p.  Pt wants to continue monthly visits for now. Not wanting to d/c completely. Advised him to call if needed before our next visit in 3-4 wks.   Marylouise Stacks, [place] [facility] Paramedic  11/01/17

## 2017-11-02 ENCOUNTER — Telehealth (HOSPITAL_COMMUNITY): Payer: Self-pay | Admitting: Pharmacist

## 2017-11-02 LAB — LUPUS ANTICOAGULANT PANEL
DRVVT: 71.4 s — ABNORMAL HIGH (ref 0.0–47.0)
PTT Lupus Anticoagulant: 40.2 s (ref 0.0–51.9)

## 2017-11-02 LAB — DRVVT CONFIRM: DRVVT CONFIRM: 1.1 ratio (ref 0.8–1.2)

## 2017-11-02 LAB — DRVVT MIX: dRVVT Mix: 52.3 s — ABNORMAL HIGH (ref 0.0–47.0)

## 2017-11-02 NOTE — Telephone Encounter (Signed)
[name] was confused about whether he should be taking digoxin 1/2 tablet daily or 1 whole tablet daily. In March when his dig level was checked, it was elevated so he was told to reduce his dose to 1/2 tablet daily which he states he did. At his last visit with Dr. [name] on 10/19/17, his digoxin level was checked and it was within the goal range so he was asked to continue the 1/2 tablet daily. At that same visit, Dr. [name] increased his spironolactone from 1/2 tablet to a full tablet daily and [name] got confused and increased both his digoxin and his spironolactone to 1 tablet daily. I have asked [name] to reduce his digoxin back to 1/2 tablet daily and continue his increased spironolactone at 1 tablet daily. [name] verbalized understanding and was grateful for the clarification.   Ruta Hinds. Velva Harman, PharmD, BCPS, CPP Clinical Pharmacist Phone: [phone] 11/02/2017 10:41 AM

## 2017-11-08 ENCOUNTER — Ambulatory Visit: Payer: Medicare HMO | Admitting: Family Medicine

## 2017-11-09 ENCOUNTER — Encounter: Payer: Self-pay | Admitting: Family Medicine

## 2017-11-12 ENCOUNTER — Other Ambulatory Visit (HOSPITAL_COMMUNITY): Payer: Self-pay | Admitting: Adult Health

## 2017-11-12 ENCOUNTER — Other Ambulatory Visit: Payer: Self-pay | Admitting: Cardiology

## 2017-11-12 MED FILL — metFORMIN HCL 500 MG TABS: 500 | 30 days supply | Qty: 60 | Fill #1

## 2017-11-13 ENCOUNTER — Other Ambulatory Visit (HOSPITAL_COMMUNITY): Payer: Self-pay

## 2017-11-13 MED ORDER — ATORVASTATIN CALCIUM 40 MG PO TABS: 40.0000 mg | ORAL_TABLET | Freq: Every day | ORAL | 3 refills | Status: DC

## 2017-11-13 MED FILL — ATORVASTATIN 40 MG TABLET: 40 | 90 days supply | Qty: 90 | Fill #0

## 2017-11-16 ENCOUNTER — Encounter: Payer: Self-pay | Admitting: Family Medicine

## 2017-11-16 NOTE — Telephone Encounter (Signed)
I said week of 11/20/17 in the afternoons as long as you can find 1h gap in my PM schedule for me to do bronch and come back. Also same 11/27/17 or 11/28/17 or 11/30/17 in the afternoon - you will need to block 1h off my afternoon schedule for me to do bronch

## 2017-11-16 NOTE — Telephone Encounter (Signed)
Vincent Chandler  1. Please apologize to patient for delay  2. Dr McLean ok with me doing bronc with lavage under moderate sedation  3. Please see if patient can do bronch any afternoon next week from Tuesday 11/20/17 at [place]. You will have to block out 1h of the afternoon clinic schedule so I can go to [place] do bronch and come back  Thanks  Dr. Khali Albanese, M.D., F.C.C.P Pulmonary and Critical Care Medicine Staff Physician, [facility] Director - Interstitial Lung Disease  Program  [facility] [place] [facility] [place], [place], [address]  Pager: [phone], If no answer or between  15:00h - 7:00h: call [phone] Telephone: [phone]     

## 2017-11-16 NOTE — Progress Notes (Unsigned)
Vincent Chandler  1. Please apologize to patient for delay  2. Dr [name] ok with me doing bronc with lavage under moderate sedation  3. Please see if patient can do bronch any afternoon next week from Tuesday 11/20/17 at [place] [facility]. You will have to block out 1h of the afternoon clinic schedule so I can go to [place] do bronch and come back  Thanks  Dr. Brand Males, M.D., [facility].C.P Pulmonary and Critical Care Medicine Staff Physician, [place] Director - Interstitial Lung Disease  Program  Pulmonary [place] at [employer], [place], [address]  Pager: [phone], If no answer or between  15:00h - 7:00h: call [phone] Telephone: [phone]

## 2017-11-16 NOTE — Telephone Encounter (Signed)
Called and spoke with pt letting him know we could do bronch.  Pt is currently at the beach and stated to call him Tuesday to let him know about the bronch due to having other doctor's appts up until then when he comes back home from the beach.  MR, due to this, please advise another day other than Tuesday, 5/21 to schedule the bronch for pt. It will need to be after 5/21.  Please let me know more than one day if able so that way I can have more than one if the first day you tell me is not able to happen over at [facility].  Thanks!

## 2017-11-19 ENCOUNTER — Ambulatory Visit (HOSPITAL_COMMUNITY)
Admission: RE | Admit: 2017-11-19 | Discharge: 2017-11-19 | Disposition: A | Discharge status: Home / Self Care | Payer: Medicare HMO | Source: Ambulatory Visit | Attending: Internal Medicine | Admitting: Internal Medicine

## 2017-11-19 ENCOUNTER — Other Ambulatory Visit (HOSPITAL_COMMUNITY): Payer: Self-pay

## 2017-11-19 VITALS — BP 118/72 | HR 71 | Wt 200.0 lb

## 2017-11-19 DIAGNOSIS — I5022 Chronic systolic (congestive) heart failure: Secondary | ICD-10-CM | POA: Diagnosis present

## 2017-11-19 DIAGNOSIS — I4892 Unspecified atrial flutter: Secondary | ICD-10-CM | POA: Insufficient documentation

## 2017-11-19 DIAGNOSIS — Z951 Presence of aortocoronary bypass graft: Secondary | ICD-10-CM | POA: Insufficient documentation

## 2017-11-19 DIAGNOSIS — N183 Chronic kidney disease, stage 3 (moderate): Secondary | ICD-10-CM | POA: Diagnosis not present

## 2017-11-19 DIAGNOSIS — E1122 Type 2 diabetes mellitus with diabetic chronic kidney disease: Secondary | ICD-10-CM | POA: Insufficient documentation

## 2017-11-19 DIAGNOSIS — I2510 Atherosclerotic heart disease of native coronary artery without angina pectoris: Secondary | ICD-10-CM | POA: Insufficient documentation

## 2017-11-19 DIAGNOSIS — I272 Pulmonary hypertension, unspecified: Secondary | ICD-10-CM | POA: Insufficient documentation

## 2017-11-19 DIAGNOSIS — Z7901 Long term (current) use of anticoagulants: Secondary | ICD-10-CM | POA: Insufficient documentation

## 2017-11-19 DIAGNOSIS — Z79899 Other long term (current) drug therapy: Secondary | ICD-10-CM | POA: Diagnosis not present

## 2017-11-19 DIAGNOSIS — G4733 Obstructive sleep apnea (adult) (pediatric): Secondary | ICD-10-CM | POA: Insufficient documentation

## 2017-11-19 DIAGNOSIS — J8410 Pulmonary fibrosis, unspecified: Secondary | ICD-10-CM | POA: Diagnosis not present

## 2017-11-19 LAB — BASIC METABOLIC PANEL
ANION GAP: 7 (ref 5–15)
BUN: 22 mg/dL — AB (ref 6–20)
CALCIUM: 8.9 mg/dL (ref 8.9–10.3)
CO2: 23 mmol/L (ref 22–32)
Chloride: 108 mmol/L (ref 101–111)
Creatinine, Ser: 1.24 mg/dL (ref 0.61–1.24)
GFR calc Af Amer: >60 mL/min (ref >60)
GLUCOSE: 184 mg/dL — AB (ref 65–99)
POTASSIUM: 4.5 mmol/L (ref 3.5–5.1)
Sodium: 138 mmol/L (ref 135–145)

## 2017-11-19 MED ORDER — CARVEDILOL 12.5 MG PO TABS: 12.5000 mg | ORAL_TABLET | Freq: Two times a day (BID) | ORAL | 3 refills | Status: DC

## 2017-11-19 NOTE — Progress Notes (Signed)
HF MD: Vincent Chandler  HPI:  [name] is a 57 year old with history of A flutter, chronic systolic heart failure, CAD s/p CABG, CKD Stage III, DMII, OSA.   Admitted 08/07/17 with atrial flutter/RVR and acute on chronic systolic heart failure. Underwent successful DC/CV on 2/8. Required short term milrinone but was able to wean off. HF meds started. Echo that admission with EF 15%. D/C weight 201 pounds.  CPX in 2/19 showed severe functional impairment due to heart failure. However, PFTs were restrictive and high resolution CT chest was concerning for interstitial lung disease.He has had atrial flutter ablation.   He had RHC in 4/19 showing preserved cardiac output and low filling pressures, but moderate pulmonary hypertension.  He presents today for pharmacist-led medication titration. At last visit 4/19 spironolactone was increased to 62m daily by MD. Pt recently went to beach and reports gaining 2 lbs but denies SOB and has no edema on exam. Pt also notes rash on his back that developed just prior to going to beach - this has improved with topical corticosteroids.     Shortness of breath/dyspnea on exertion? no   Orthopnea/PND? no  Edema? no  Lightheadedness/dizziness? Yes - occasionally when he sits and stands  Daily weights at home? Three times weekly (~198 lbs)  Blood pressure/heart rate monitoring at home? no  Following low-sodium/fluid-restricted diet? Yes - went out to eat a lot at the beach this past week but generally low Na diet at home  HF Medications: -Carvedilol 9.3727mBID -Digoxin 0.062544maily -Entresto 49-51 BID -Spironolactone 34m60mily -Furosemide 40mg68mly as needed (pt has only ever taken once)  Has the patient been experiencing any side effects to the medications prescribed?  Yes - Rash as noted above which pt believes could be drug-induced - but continues to take meds and rash improving with treatment    Does the patient have any problems  obtaining medications due to transportation or finances?   no  Understanding of regimen: good Understanding of indications: good Potential of compliance: good Patient understands to avoid NSAIDs. Patient understands to avoid decongestants.   Pertinent Lab Values:  5/20: SCr 1.24 mg/dl, CO2 23 mmol/L, Potassium 4.5 mmol/L, Sodium 138 mmol/L  4/30: SCr 1.49 mg/dl, CO2 25 mmol/L, Potassium 5.1 mmol/L, Sodium 138 mmol/L   Vital Signs:  Weight: 200 lbs (dry weight: ~198 lbs)  Blood pressure: 118/72 mmHg   Heart rate: 71 bpm   Assessment: 1. Chronicsystolic CHF (EF 15%),25%e to ischemia. NYHA class IIsymptoms. -Despite 2 lb weight gain, pt does not appear grossly volume-overloaded - no SOB, DOE, or edema noted. In addition, pt has recent weights ~200 lbs noted in March. Will have patient take one dose of Lasix tonight to be sure. -BMET today. -Increase carvedilol to 12.5mg B34m- as above, doubt patient is excessively fluid-overloaded.  -Continue Entresto 49-51mg B2mdigoxin 0.0625 mg daily, and spironolactone 34mg da73m  -Basic disease state pathophysiology, medication indication, mechanism and side effects reviewed at length with patient and he verbalized understanding 2. Atrial flutter: Paroxysmal s/p DCCV 2/19 and ablation 3/19 -He is now off amiodarone (question of possible lung toxicity).  -Continue Eliquis 5 mg bid  -Pt has had recent bleeding with hemorrhoids but denies dark tarry stools 3. CAD s/p CABG. Last intervention was PCI to LCx in 2016 -Continue atorvastatin -No ASA with Eliquis use.  4. Pulmonary fibrosis: PFTs in 2/19 were restrictive, and high resolution CT showed interstitial lung disease.Amiodarone was stopped due to concern for  possible toxicity.  -Possible amiodarone lung toxicity, he is now off amiodarone -[name] following, negative autoimmune w/u, planning BAL 5. OSA:  -Arrange appt with Dr. Radford Pax - tomorrow 5/21 6. CKD III: -Cr  stable on BMET 4/30 7. Pulmonary hypertension: Suspect group 3 PH due to lung disease (ILD).   Plan: 1) Medication changes: Based on clinical presentation, vital signs and recent labs will  take furosemide 45m po x1 today, and increase carvedilol to 12.510mBID.  2) Labs: BMET today 3) Follow-up: 2 weeks with pharmacy, 4 weeks with MD   LiBonnita Nasutiharm.D. CPP, BCPS Clinical Pharmacist [phone]/20/2019 4:51 PM

## 2017-11-19 NOTE — Patient Instructions (Addendum)
Take a dose of furosemide (Lasix) 71m once tonight.  Increase your carvedilol (Coreg) to 12.579mtwice daily. You may take two of your 6.2535mablets twice daily until you run out.  We took bloodwork today, we will call you if any changes need to be made.  Return to pharmacy clinic 12/03/17 at 1:15pm.

## 2017-11-19 NOTE — Progress Notes (Signed)
Paramedicine Encounter   Patient ID: Vincent Chandler , male,   DOB: 1961-02-07,57 y.o.,  MRN: [id number]   Met patient in clinic today with provider.  Time spent with patient 7mn  Met pt in clinic with pharmacy. Pt reports that he is feeling good, he just got back from beach trip, gained 2 lbs from last visit.  He states he will likely take a lasix when he gets home.  He started having an itchy rash on top of back of his neck and a spot came up on back of his left calf area. He has been using hydrocortisone creme that was a rx a friend had and it has helped. Pharmacist advised for him to watch it and if it worsens then for pt to contact him.  Having issues with hemorrhoids with bright red blood, he was cautioned to be aware of dark tarry like stools.   Labs will be done today.  Carvedilol will be increased to 12.526m He will be sch to come back 6/3 for pharmacy f/u.  He will take lasix today and weigh himself. No swelling noted so it may just be food weight.   Weight @ clinic-200  KaMarylouise StacksEMT-Paramedic 11/19/2017   ACTION: Home visit completed

## 2017-11-20 ENCOUNTER — Ambulatory Visit: Payer: Medicare HMO | Admitting: Family Medicine

## 2017-11-20 ENCOUNTER — Telehealth: Payer: Self-pay | Admitting: *Deleted

## 2017-11-20 ENCOUNTER — Encounter: Payer: Self-pay | Admitting: Family Medicine

## 2017-11-20 ENCOUNTER — Ambulatory Visit: Payer: Medicare HMO | Admitting: Cardiology

## 2017-11-20 ENCOUNTER — Encounter: Payer: Self-pay | Admitting: Cardiology

## 2017-11-20 ENCOUNTER — Ambulatory Visit (INDEPENDENT_AMBULATORY_CARE_PROVIDER_SITE_OTHER): Payer: Medicare HMO | Admitting: Family Medicine

## 2017-11-20 VITALS — BP 100/64 | HR 76 | Ht 69.0 in | Wt 200.6 lb

## 2017-11-20 VITALS — BP 109/66 | HR 78 | Temp 98.5°F | Resp 14 | Wt 199.4 lb

## 2017-11-20 DIAGNOSIS — E1165 Type 2 diabetes mellitus with hyperglycemia: Secondary | ICD-10-CM

## 2017-11-20 DIAGNOSIS — E1151 Type 2 diabetes mellitus with diabetic peripheral angiopathy without gangrene: Secondary | ICD-10-CM

## 2017-11-20 DIAGNOSIS — I10 Essential (primary) hypertension: Secondary | ICD-10-CM

## 2017-11-20 DIAGNOSIS — G4733 Obstructive sleep apnea (adult) (pediatric): Secondary | ICD-10-CM | POA: Diagnosis not present

## 2017-11-20 DIAGNOSIS — I255 Ischemic cardiomyopathy: Secondary | ICD-10-CM | POA: Diagnosis not present

## 2017-11-20 DIAGNOSIS — IMO0001 Reserved for inherently not codable concepts without codable children: Secondary | ICD-10-CM

## 2017-11-20 DIAGNOSIS — I2510 Atherosclerotic heart disease of native coronary artery without angina pectoris: Secondary | ICD-10-CM

## 2017-11-20 DIAGNOSIS — L0201 Cutaneous abscess of face: Secondary | ICD-10-CM

## 2017-11-20 DIAGNOSIS — E785 Hyperlipidemia, unspecified: Secondary | ICD-10-CM | POA: Diagnosis not present

## 2017-11-20 MED ORDER — DOXYCYCLINE HYCLATE 100 MG PO TABS: 100.0000 mg | ORAL_TABLET | Freq: Two times a day (BID) | ORAL | 0 refills | Status: DC

## 2017-11-20 MED ORDER — CEFTRIAXONE SODIUM 1 G IJ SOLR
1.0000 g | Freq: Once | INTRAMUSCULAR | Status: AC
  Administered 2017-11-20: 1 g via INTRAMUSCULAR

## 2017-11-20 MED ORDER — METFORMIN HCL 500 MG PO TABS: 500.0000 mg | ORAL_TABLET | Freq: Two times a day (BID) | ORAL | 3 refills | Status: DC

## 2017-11-20 NOTE — Progress Notes (Signed)
Patient ID: Vincent Chandler, male    DOB: Nov 26, 1960  Age: 57 y.o. MRN: [id number]    Subjective:  Subjective  HPI Vincent Chandler presents for f/u dm ---  He is also c/o cyst on  l side of his chin/  No other complaints.  Pt was admitted /4/3 for dental procedure ---  He has a hx a flutter, chronic sys heart failure , cad--s/p cabg, ckd stage III, DMII and OSA.  Had a flutter ablation 09/12/17 HYPERTENSION   Blood pressure range-not checking   Chest pain- no      Dyspnea- no Lightheadedness- no   Edema- no  Other side effects - no   Medication compliance: good Low salt diet- yes     DIABETES    Blood Sugar ranges-120-140  Polyuria- no New Visual problems- no  Hypoglycemic symptoms- no  Other side effects-no Medication compliance - g Last eye exam- in last 3 monthes  Foot exam- today   HYPERLIPIDEMIA  Medication compliance- good RUQ pain- no  Muscle aches- no Other side effects-no       Review of Systems  Constitutional: Negative for appetite change, diaphoresis, fatigue and unexpected weight change.  Eyes: Negative for pain, redness and visual disturbance.  Respiratory: Negative for cough, chest tightness, shortness of breath and wheezing.   Cardiovascular: Negative for chest pain, palpitations and leg swelling.  Endocrine: Negative for cold intolerance, heat intolerance, polydipsia, polyphagia and polyuria.  Genitourinary: Negative for difficulty urinating, dysuria and frequency.  Neurological: Negative for dizziness, light-headedness, numbness and headaches.    History Past Medical History:  Diagnosis Date  . AICD (automatic cardioverter/defibrillator) present   . Anginal pain ([place])   . Anxiety   . Asthma   . Atrial fibrillation-postoperative 11/28/2012  . Cardiomyopathy    alcohol use related  . CHF (congestive heart failure) ([place])   . Chronic back pain   . Coronary artery disease    drug eluting stent RCA 2005-EF 30%- s/p CABG x 4; 2/4 patent grafts with  SVG-PLOM and SVG-RCA system totally occluded. There are collaterals from the LAD system to the RCA and the RCA is totally occluded proximally. There are no collaterals to the LCx territory. He then underwent successful PCI of the mid left circumflex artery with overlapping Synergy drug-eluting stents  . Depression   . Diabetes mellitus type II dx'd in the 1990's  . GERD (gastroesophageal reflux disease)    yrs ago  . H/O hiatal hernia   . History of hypogonadism   . History of kidney stones   . Hyperlipidemia   . Hypertension   . OSA on CPAP    "mask is broken; working on getting a new one" (01/15/2015; 10/03/2017)  . Pneumonia    "3-4 times" (10/03/2017)  . Urinary incontinence     He has a past surgical history that includes Tonsillectomy (~ 1970); Coronary angioplasty with stent (~ 2003); Refractive surgery (Bilateral, 1990's); Coronary artery bypass graft (N/A, 08/15/2012); left heart catheterization with coronary angiogram (N/A, 08/08/2012); left heart catheterization with coronary/graft angiogram (N/A, 07/21/2014); percutaneous coronary stent intervention (pci-s) (07/21/2014); Cardiac defibrillator placement (01/15/2015); Cardiac catheterization (N/A, 01/15/2015); TEE without cardioversion (N/A, 08/10/2017); Cardioversion (N/A, 08/10/2017); A-FLUTTER ABLATION (N/A, 09/12/2017); RIGHT HEART CATH (N/A, 10/03/2017); and Multiple extractions with alveoloplasty (N/A, 10/04/2017).   His family history includes Diabetes in his father and mother; Hyperlipidemia in his father; Hypertension in his father and mother.He reports that he has never smoked. His smokeless tobacco use includes chew. He reports that he  drinks about 1.8 oz of alcohol per week. He reports that he does not use drugs.  Current Outpatient Medications on File Prior to Visit  Medication Sig Dispense Refill  . ACCU-CHEK FASTCLIX LANCETS MISC Use as directed once a day.  Dx code: E11.9 100 each 1  . apixaban (ELIQUIS) 5 MG TABS tablet Take 1 tablet  (5 mg total) by mouth 2 (two) times daily. 180 tablet 3  . atorvastatin (LIPITOR) 40 MG tablet Take 1 tablet (40 mg total) by mouth at bedtime. 90 tablet 3  . blood glucose meter kit and supplies Dispense based on patient and insurance preference. Use as directed once a day. Dx Code E11.9. 1 each 0  . Blood Glucose Monitoring Suppl (ACCU-CHEK GUIDE) w/Device KIT 1 each by Does not apply route daily. DX Code: E11.9 1 kit 0  . carvedilol (COREG) 12.5 MG tablet Take 1 tablet (12.5 mg total) by mouth 2 (two) times daily with a meal. 60 tablet 3  . digoxin (LANOXIN) 0.125 MG tablet Take 0.0625 mg by mouth daily.    . furosemide (LASIX) 40 MG tablet Take 1 tablet (40 mg total) by mouth daily as needed. 30 tablet 11  . glucose blood (ACCU-CHEK GUIDE) test strip Use as directed once a day.  Dx code: E11.9 100 each 1  . guaiFENesin (MUCINEX) 600 MG 12 hr tablet Take 1 tablet (600 mg total) by mouth 2 (two) times daily as needed for to loosen phlegm.    . mometasone-formoterol (DULERA) 200-5 MCG/ACT AERO Inhale 2 puffs into the lungs 2 (two) times daily. Further refills per PCP 1 Inhaler 0  . nitroGLYCERIN (NITROSTAT) 0.4 MG SL tablet Place 1 tablet (0.4 mg total) under the tongue every 5 (five) minutes as needed for chest pain. 25 tablet 3  . sacubitril-valsartan (ENTRESTO) 49-51 MG Take 1 tablet by mouth 2 (two) times daily. 30 tablet 0  . spironolactone (ALDACTONE) 25 MG tablet Take 1 tablet (25 mg total) by mouth daily. 30 tablet 3   No current facility-administered medications on file prior to visit.      Objective:  Objective  Physical Exam  Constitutional: He is oriented to person, place, and time. Vital signs are normal. He appears well-developed and well-nourished. He is sleeping.  HENT:  Head: Normocephalic and atraumatic.  Mouth/Throat: Oropharynx is clear and moist.  Eyes: Pupils are equal, round, and reactive to light. EOM are normal.  Neck: Normal range of motion. Neck supple. No  thyromegaly present.  Cardiovascular: Normal rate and regular rhythm.  No murmur heard. Pulmonary/Chest: Effort normal and breath sounds normal. No respiratory distress. He has no wheezes. He has no rales. He exhibits no tenderness.  Musculoskeletal: He exhibits no edema or tenderness.  Neurological: He is alert and oriented to person, place, and time.  Skin: Skin is warm and dry.  Psychiatric: He has a normal mood and affect. His behavior is normal. Judgment and thought content normal.  Nursing note and vitals reviewed.  Diabetic Foot Exam - Simple   No data filed      BP 109/66 (BP Location: Left Arm, Patient Position: Sitting, Cuff Size: Normal)   Pulse 78   Temp 98.5 F (36.9 C) (Oral)   Resp 14   Wt 199 lb 6.4 oz (90.4 kg)   SpO2 97%   BMI 29.45 kg/m  Wt Readings from Last 3 Encounters:  11/20/17 199 lb 6.4 oz (90.4 kg)  11/20/17 200 lb 9.6 oz (91 kg)  11/19/17 200  lb (90.7 kg)     Lab Results  Component Value Date   WBC 5.3 10/01/2017   HGB 13.0 10/01/2017   HCT 40.3 10/01/2017   PLT 163 10/01/2017   GLUCOSE 184 (H) 11/19/2017   CHOL 109 09/10/2017   TRIG 147 09/10/2017   HDL 28 (L) 09/10/2017   LDLDIRECT 134.0 09/21/2015   LDLCALC 52 09/10/2017   ALT 44 09/10/2017   AST 32 09/10/2017   NA 138 11/19/2017   K 4.5 11/19/2017   CL 108 11/19/2017   CREATININE 1.24 11/19/2017   BUN 22 (H) 11/19/2017   CO2 23 11/19/2017   TSH 4.560 (H) 09/10/2017   PSA 0.26 09/21/2015   INR 1.06 10/03/2017   HGBA1C 8.7 (H) 08/17/2017   MICROALBUR 1.9 09/21/2015    No results found.   Assessment & Plan:  Plan  I am having [name] start on doxycycline. I am also having him maintain his nitroGLYCERIN, guaiFENesin, blood glucose meter kit and supplies, ACCU-CHEK GUIDE, glucose blood, ACCU-CHEK FASTCLIX LANCETS, apixaban, sacubitril-valsartan, digoxin, mometasone-formoterol, spironolactone, furosemide, atorvastatin, carvedilol, and metFORMIN.  Meds ordered this  encounter  Medications  . DISCONTD: metFORMIN (GLUCOPHAGE) 500 MG tablet    Sig: Take 1 tablet (500 mg total) by mouth 2 (two) times daily with a meal.    Dispense:  60 tablet    Refill:  3  . doxycycline (VIBRA-TABS) 100 MG tablet    Sig: Take 1 tablet (100 mg total) by mouth 2 (two) times daily.    Dispense:  20 tablet    Refill:  0  . metFORMIN (GLUCOPHAGE) 500 MG tablet    Sig: Take 1 tablet (500 mg total) by mouth 2 (two) times daily with a meal.    Dispense:  60 tablet    Refill:  3    Problem List Items Addressed This Visit      Unprioritized   Coronary artery disease prior RCA stent with new inferior Q waves    Per cardiology      Essential hypertension    Well controlled, no changes to meds. Encouraged heart healthy diet such as the DASH diet and exercise as tolerated.       Relevant Orders   Hemoglobin A1c   Lipid panel   Comprehensive metabolic panel   Hyperlipidemia (Chronic)    Tolerating statin, encouraged heart healthy diet, avoid trans fats, minimize simple carbs and saturated fats. Increase exercise as tolerated      Relevant Orders   Lipid panel   Comprehensive metabolic panel   Ischemic and nonischemic cardiomyopathy      Per cardiology      Uncontrolled type 2 diabetes mellitus without complication, without long-term current use of insulin (HCC) (Chronic)    hgba1c to be done, minimize simple carbs. Increase exercise as tolerated. Continue current meds       Relevant Medications   metFORMIN (GLUCOPHAGE) 500 MG tablet    Other Visit Diagnoses    Cutaneous abscess of face    -  Primary   Relevant Medications   doxycycline (VIBRA-TABS) 100 MG tablet   DM (diabetes mellitus) type II, controlled, with peripheral vascular disorder (HCC)       Relevant Medications   metFORMIN (GLUCOPHAGE) 500 MG tablet   Other Relevant Orders   Hemoglobin A1c   Lipid panel   Comprehensive metabolic panel      Follow-up: Return in about 6 months (around  05/23/2018) for hypertension, hyperlipidemia, diabetes II, annual exam, fasting.  Kendrick Fries  [place], DO
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
## 2017-11-20 NOTE — Addendum Note (Signed)
Addended by: Teressa Senter on: 11/20/2017 02:06 PM   Modules accepted: Orders

## 2017-11-20 NOTE — Patient Instructions (Signed)

## 2017-11-20 NOTE — Telephone Encounter (Signed)
-----  Message from [place], RN sent at 11/20/2017  2:13 PM EDT ----- Regarding: sleep study  Sleep study ordered. Epworth sleepiness scale in Epic  Thanks  [employer]

## 2017-11-20 NOTE — Assessment & Plan Note (Signed)
Per cardiology

## 2017-11-20 NOTE — Patient Instructions (Signed)
Medication Instructions:  Your physician recommends that you continue on your current medications as directed. Please refer to the Current Medication list given to you today.  If you need a refill on your cardiac medications, please contact your pharmacy first.  Labwork: None ordered   Testing/Procedures: Your physician has recommended that you have a sleep study. This test records several body functions during sleep, including: brain activity, eye movement, oxygen and carbon dioxide blood levels, heart rate and rhythm, breathing rate and rhythm, the flow of air through your mouth and nose, snoring, body muscle movements, and chest and belly movement.   Follow-Up: Your physician wants you to follow-up AS NEEDED with Dr. Radford Pax   Any Other Special Instructions Will Be Listed Below (If Applicable).   Thank you for choosing [name], RN  [phone]  If you need a refill on your cardiac medications before your next appointment, please call your pharmacy.

## 2017-11-20 NOTE — Addendum Note (Signed)
Addended by: Bartholome Bill on: 11/20/2017 05:58 PM   Modules accepted: Orders

## 2017-11-20 NOTE — Assessment & Plan Note (Signed)
Per cardiology 

## 2017-11-20 NOTE — Assessment & Plan Note (Signed)
Well controlled, no changes to meds. Encouraged heart healthy diet such as the DASH diet and exercise as tolerated.

## 2017-11-20 NOTE — Assessment & Plan Note (Signed)
Tolerating statin, encouraged heart healthy diet, avoid trans fats, minimize simple carbs and saturated fats. Increase exercise as tolerated 

## 2017-11-20 NOTE — Assessment & Plan Note (Signed)
hgba1c to be done, minimize simple carbs. Increase exercise as tolerated. Continue current meds  

## 2017-11-21 ENCOUNTER — Ambulatory Visit (INDEPENDENT_AMBULATORY_CARE_PROVIDER_SITE_OTHER): Payer: Medicare HMO | Admitting: Internal Medicine

## 2017-11-21 ENCOUNTER — Encounter: Payer: Self-pay | Admitting: Internal Medicine

## 2017-11-21 ENCOUNTER — Telehealth: Payer: Self-pay | Admitting: *Deleted

## 2017-11-21 VITALS — BP 132/68 | HR 73 | Temp 97.9°F | Resp 14 | Ht 69.0 in | Wt 199.4 lb

## 2017-11-21 DIAGNOSIS — L0201 Cutaneous abscess of face: Secondary | ICD-10-CM

## 2017-11-21 LAB — LDL CHOLESTEROL, DIRECT: Direct LDL: 69 mg/dL

## 2017-11-21 LAB — LIPID PANEL
CHOL/HDL RATIO: 4
Cholesterol: 119 mg/dL (ref 0–200)
HDL: 28.9 mg/dL — ABNORMAL LOW (ref >39.00)
NONHDL: 90.18
Triglycerides: 201 mg/dL — ABNORMAL HIGH (ref 0.0–149.0)
VLDL: 40.2 mg/dL — ABNORMAL HIGH (ref 0.0–40.0)

## 2017-11-21 LAB — COMPREHENSIVE METABOLIC PANEL
ALK PHOS: 81 U/L (ref 39–117)
ALT: 29 U/L (ref 0–53)
AST: 17 U/L (ref 0–37)
Albumin: 4.3 g/dL (ref 3.5–5.2)
BILIRUBIN TOTAL: 0.6 mg/dL (ref 0.2–1.2)
BUN: 23 mg/dL (ref 6–23)
CO2: 29 meq/L (ref 19–32)
Calcium: 9.7 mg/dL (ref 8.4–10.5)
Chloride: 102 mEq/L (ref 96–112)
Creatinine, Ser: 1.36 mg/dL (ref 0.40–1.50)
GFR: 57.55 mL/min — AB (ref >60.00)
GLUCOSE: 159 mg/dL — AB (ref 70–99)
POTASSIUM: 4.4 meq/L (ref 3.5–5.1)
SODIUM: 139 meq/L (ref 135–145)
TOTAL PROTEIN: 7.8 g/dL (ref 6.0–8.3)

## 2017-11-21 LAB — HEMOGLOBIN A1C: Hgb A1c MFr Bld: 6.4 % (ref 4.6–6.5)

## 2017-11-21 NOTE — Progress Notes (Signed)
Subjective:    Patient ID: Vincent Chandler, male    DOB: 23-Jun-1961, 57 y.o.   MRN: [id number]  DOS:  11/21/2017 Type of visit - description : Acute visit Interval history: Developed pain and swelling at the left side of the face 3 days ago, was seen yesterday, diagnosed with abscess, got Rocephin 1 g IM, prescribed doxycycline. Patient is here because he is not any better.  Reports that the area is not worse.   Review of Systems Denies fever chills Denies previous injury or cut at the area of abscess. No dental pain at this point.  Had dental work about a month ago.  Past Medical History:  Diagnosis Date  . AICD (automatic cardioverter/defibrillator) present   . Anginal pain ([place])   . Anxiety   . Asthma   . Atrial fibrillation-postoperative 11/28/2012  . Cardiomyopathy    alcohol use related  . CHF (congestive heart failure) ([place])   . Chronic back pain   . Coronary artery disease    drug eluting stent RCA 2005-EF 30%- s/p CABG x 4; 2/4 patent grafts with SVG-PLOM and SVG-RCA system totally occluded. There are collaterals from the LAD system to the RCA and the RCA is totally occluded proximally. There are no collaterals to the LCx territory. He then underwent successful PCI of the mid left circumflex artery with overlapping Synergy drug-eluting stents  . Depression   . Diabetes mellitus type II dx'd in the 1990's  . GERD (gastroesophageal reflux disease)    yrs ago  . H/O hiatal hernia   . History of hypogonadism   . History of kidney stones   . Hyperlipidemia   . Hypertension   . OSA on CPAP    "mask is broken; working on getting a new one" (01/15/2015; 10/03/2017)  . Pneumonia    "3-4 times" (10/03/2017)  . Urinary incontinence     Past Surgical History:  Procedure Laterality Date  . A-FLUTTER ABLATION N/A 09/12/2017   Procedure: A-FLUTTER ABLATION;  Surgeon: Deboraha Sprang, MD;  Location: [place] [facility];  Service: Cardiovascular;  Laterality: N/A;  . CARDIAC  DEFIBRILLATOR PLACEMENT  01/15/2015  . CARDIOVERSION N/A 08/10/2017   Procedure: CARDIOVERSION;  Surgeon: Larey Dresser, MD;  Location: [facility] ENDOSCOPY;  Service: Cardiovascular;  Laterality: N/A;  . CORONARY ANGIOPLASTY WITH STENT PLACEMENT  ~ 2003  . CORONARY ARTERY BYPASS GRAFT N/A 08/15/2012   Procedure: CORONARY ARTERY BYPASS GRAFTING (CABG);  Surgeon: Ivin Poot, MD;  Location: [place];  Service: Open Heart Surgery;  Laterality: N/A;  Coronary Artery Bypass Grafting Times Four Using Left Internal Mammary Artery and Right Saphenous Leg Vein Harvested Endoscopically  . EP IMPLANTABLE DEVICE N/A 01/15/2015   Procedure: ICD Implant;  Surgeon: Deboraha Sprang, MD;  Location: [place] [facility] LAB;  Service: Cardiovascular;  Laterality: N/A;  . LEFT HEART CATHETERIZATION WITH CORONARY ANGIOGRAM N/A 08/08/2012   Procedure: LEFT HEART CATHETERIZATION WITH CORONARY ANGIOGRAM;  Surgeon: Peter M Martinique, MD;  Location: [facility] CATH LAB;  Service: Cardiovascular;  Laterality: N/A;  . LEFT HEART CATHETERIZATION WITH CORONARY/GRAFT ANGIOGRAM N/A 07/21/2014   Procedure: LEFT HEART CATHETERIZATION WITH Beatrix Fetters;  Surgeon: Larey Dresser, MD;  Location: [facility] CATH LAB;  Service: Cardiovascular;  Laterality: N/A;  . MULTIPLE EXTRACTIONS WITH ALVEOLOPLASTY N/A 10/04/2017   Procedure: Extraction of tooth #'s 5 and 14 with alveoloplasty and gross debridement of remaining teeth;  Surgeon: [name], DDS;  Location: [place];  Service: Oral Surgery;  Laterality: N/A;  . PERCUTANEOUS CORONARY STENT INTERVENTION (PCI-S)  07/21/2014   Procedure: PERCUTANEOUS CORONARY STENT INTERVENTION (PCI-S);  Surgeon: Larey Dresser, MD;  Location: [facility] CATH LAB;  Service: Cardiovascular;;  . REFRACTIVE SURGERY Bilateral 1990's  . RIGHT HEART CATH N/A 10/03/2017   Procedure: RIGHT HEART CATH;  Surgeon: Larey Dresser, MD;  Location: [place] CV LAB;  Service: Cardiovascular;  Laterality: N/A;  . TEE WITHOUT CARDIOVERSION N/A  08/10/2017   Procedure: TRANSESOPHAGEAL ECHOCARDIOGRAM (TEE);  Surgeon: Larey Dresser, MD;  Location: [facility] ENDOSCOPY;  Service: Cardiovascular;  Laterality: N/A;  . TONSILLECTOMY  ~ 1970    Social History   Socioeconomic History  . Marital status: Divorced    Spouse name: n/a  . Number of children: 0  . Years of education: 12th grade  . Highest education level: Not on file  Occupational History  . Occupation: Data processing manager ---self employed    Employer: [employer]  Social Needs  . Financial resource strain: Not on file  . Food insecurity:    Worry: Not on file    Inability: Not on file  . Transportation needs:    Medical: Not on file    Non-medical: Not on file  Tobacco Use  . Smoking status: Never Smoker  . Smokeless tobacco: Current User    Types: Chew  Substance and Sexual Activity  . Alcohol use: Yes    Alcohol/week: 1.8 oz    Types: 3 Glasses of wine per week  . Drug use: No  . Sexual activity: Not Currently    Partners: Female  Lifestyle  . Physical activity:    Days per week: Not on file    Minutes per session: Not on file  . Stress: Not on file  Relationships  . Social connections:    Talks on phone: Not on file    Gets together: Not on file    Attends religious service: Not on file    Active member of club or organization: Not on file    Attends meetings of clubs or organizations: Not on file    Relationship status: Not on file  . Intimate partner violence:    Fear of current or ex partner: Not on file    Emotionally abused: Not on file    Physically abused: Not on file    Forced sexual activity: Not on file  Other Topics Concern  . Not on file  Social History Narrative   Exercise-- walking    Lives alone.   Brother lives in [place], [place]      Allergies as of 11/21/2017   No Known Allergies     Medication List        Accurate as of 11/21/17 11:59 PM. Always use your most recent med list.          ACCU-CHEK  FASTCLIX LANCETS Misc Use as directed once a day.  Dx code: E11.9   ACCU-CHEK GUIDE w/Device Kit 1 each by Does not apply route daily. DX Code: E11.9   apixaban 5 MG Tabs tablet Commonly known as:  ELIQUIS Take 1 tablet (5 mg total) by mouth 2 (two) times daily.   atorvastatin 40 MG tablet Commonly known as:  LIPITOR Take 1 tablet (40 mg total) by mouth at bedtime.   blood glucose meter kit and supplies Dispense based on patient and insurance preference. Use as directed once a day. Dx Code E11.9.   carvedilol 12.5 MG tablet Commonly known as:  COREG  Take 1 tablet (12.5 mg total) by mouth 2 (two) times daily with a meal.   digoxin 0.125 MG tablet Commonly known as:  LANOXIN Take 0.0625 mg by mouth daily.   doxycycline 100 MG tablet Commonly known as:  VIBRA-TABS Take 1 tablet (100 mg total) by mouth 2 (two) times daily.   furosemide 40 MG tablet Commonly known as:  LASIX Take 1 tablet (40 mg total) by mouth daily as needed.   glucose blood test strip Commonly known as:  ACCU-CHEK GUIDE Use as directed once a day.  Dx code: E11.9   guaiFENesin 600 MG 12 hr tablet Commonly known as:  MUCINEX Take 1 tablet (600 mg total) by mouth 2 (two) times daily as needed for to loosen phlegm.   metFORMIN 500 MG tablet Commonly known as:  GLUCOPHAGE Take 1 tablet (500 mg total) by mouth 2 (two) times daily with a meal.   mometasone-formoterol 200-5 MCG/ACT Aero Commonly known as:  DULERA Inhale 2 puffs into the lungs 2 (two) times daily. Further refills per PCP   nitroGLYCERIN 0.4 MG SL tablet Commonly known as:  NITROSTAT Place 1 tablet (0.4 mg total) under the tongue every 5 (five) minutes as needed for chest pain.   sacubitril-valsartan 49-51 MG Commonly known as:  ENTRESTO Take 1 tablet by mouth 2 (two) times daily.   spironolactone 25 MG tablet Commonly known as:  ALDACTONE Take 1 tablet (25 mg total) by mouth daily.          Objective:   Physical Exam  HENT:    Head:     BP 132/68 (BP Location: Left Arm, Patient Position: Sitting, Cuff Size: Small)   Pulse 73   Temp 97.9 F (36.6 C) (Oral)   Resp 14   Ht 5' 9" (1.753 m)   Wt 199 lb 6 oz (90.4 kg)   SpO2 97%   BMI 29.44 kg/m  General:   Well developed, well nourished . NAD.  HEENT:  Normocephalic . Face symmetric, atraumatic. Oral exam, tooth in good condition, gums without swelling or abscess to palpation, the floor of the mouth is soft and nontender.  Neurologic:  alert & oriented X3.  Speech normal, gait appropriate for age and unassisted Psych--  Cognition and judgment appear intact.  Cooperative with normal attention span and concentration.  Behavior appropriate. No anxious or depressed appearing.      Assessment & Plan:   57 year old gentleman with multiple medical problems including atrial flutter, CHF, CKD, anticoagulated, hyperlipidemia, CAD, OSA presents with Abscess, face: Started 3 days ago, on antibiotics for the last 24 hours, not getting worse, no obvious complications at this time. He is however high risk for complications given diabetes and anticoagulation, recommend to take antibiotics for 10 days as prescribed.  Continue using a warm compress. If he develops a small area of fluctuance,  we could do a small incision here at the office however if he gets much worse, may need more extensive I&D or IV antibiotics, thus recommend ER.

## 2017-11-21 NOTE — Telephone Encounter (Signed)
Submitted PA request to [employer] for in lab split night sleep study.

## 2017-11-21 NOTE — Progress Notes (Signed)
Pre visit review using our clinic review tool, if applicable. No additional management support is needed unless otherwise documented below in the visit note.

## 2017-11-21 NOTE — Patient Instructions (Signed)
Continue with antibiotics until they are gone  Continue applying a warm compress  If you are not gradually better let us know  If you get much more swollen or if you have fever and more redness: Go to the ER

## 2017-11-21 NOTE — Telephone Encounter (Signed)
-----  Message from [place], RN sent at 11/20/2017  2:13 PM EDT ----- Regarding: sleep study  Sleep study ordered. Epworth sleepiness scale in Epic  Thanks  [employer]

## 2017-11-21 NOTE — Telephone Encounter (Signed)
[place] scheduled at [facility] 5/29 at 2pm.  Called and spoke with pt making him aware.  Pt expressed understanding.  Routing message to MR so he has as an Pharmacist, hospital

## 2017-11-22 ENCOUNTER — Telehealth: Payer: Self-pay | Admitting: *Deleted

## 2017-11-22 NOTE — Telephone Encounter (Signed)
-----  Message from Ann Held, DO sent at 11/22/2017  1:58 PM EDT ----- Regarding: FW: FYI, I saw him yesterday, may be a good idea to check on him today or tomorrow.   Please check on him today ----- Message ----- From: Colon Branch, MD Sent: 11/22/2017   1:33 PM To: Ann Held, DO Subject: Juluis Rainier, I saw him yesterday, may be a good idea#

## 2017-11-23 ENCOUNTER — Encounter: Payer: Self-pay | Admitting: Family Medicine

## 2017-11-23 ENCOUNTER — Telehealth: Payer: Self-pay | Admitting: *Deleted

## 2017-11-23 DIAGNOSIS — B9689 Other specified bacterial agents as the cause of diseases classified elsewhere: Secondary | ICD-10-CM | POA: Diagnosis not present

## 2017-11-23 DIAGNOSIS — L0202 Furuncle of face: Secondary | ICD-10-CM | POA: Diagnosis not present

## 2017-11-23 NOTE — Telephone Encounter (Signed)
-----  Message from [place], RN sent at 11/20/2017  2:13 PM EDT ----- Regarding: sleep study  Sleep study ordered. Epworth sleepiness scale in Epic  Thanks  [employer]

## 2017-11-23 NOTE — Telephone Encounter (Signed)
Great -- ok

## 2017-11-23 NOTE — Telephone Encounter (Signed)
Staff message sent to [employer] received. Ok to schedule.

## 2017-11-23 NOTE — Telephone Encounter (Signed)
Called to check status.  He stated that he was at the dermatologist now and hopefully they can help.

## 2017-11-27 ENCOUNTER — Other Ambulatory Visit: Payer: Self-pay

## 2017-11-27 DIAGNOSIS — E1151 Type 2 diabetes mellitus with diabetic peripheral angiopathy without gangrene: Secondary | ICD-10-CM

## 2017-11-27 DIAGNOSIS — I10 Essential (primary) hypertension: Secondary | ICD-10-CM

## 2017-11-27 DIAGNOSIS — E785 Hyperlipidemia, unspecified: Secondary | ICD-10-CM

## 2017-11-28 ENCOUNTER — Encounter (HOSPITAL_COMMUNITY)
Admission: RE | Disposition: A | Discharge status: Home / Self Care | Payer: Self-pay | Source: Ambulatory Visit | Attending: Internal Medicine

## 2017-11-28 ENCOUNTER — Encounter (HOSPITAL_COMMUNITY): Payer: Self-pay | Admitting: Internal Medicine

## 2017-11-28 ENCOUNTER — Ambulatory Visit (HOSPITAL_COMMUNITY)
Admission: RE | Admit: 2017-11-28 | Discharge: 2017-11-28 | Disposition: A | Discharge status: Home / Self Care | Payer: Medicare HMO | Source: Ambulatory Visit | Attending: Internal Medicine | Admitting: Internal Medicine

## 2017-11-28 ENCOUNTER — Telehealth: Payer: Self-pay | Admitting: *Deleted

## 2017-11-28 DIAGNOSIS — K219 Gastro-esophageal reflux disease without esophagitis: Secondary | ICD-10-CM | POA: Insufficient documentation

## 2017-11-28 DIAGNOSIS — Z955 Presence of coronary angioplasty implant and graft: Secondary | ICD-10-CM | POA: Insufficient documentation

## 2017-11-28 DIAGNOSIS — J449 Chronic obstructive pulmonary disease, unspecified: Secondary | ICD-10-CM | POA: Diagnosis not present

## 2017-11-28 DIAGNOSIS — E785 Hyperlipidemia, unspecified: Secondary | ICD-10-CM | POA: Diagnosis not present

## 2017-11-28 DIAGNOSIS — Z79899 Other long term (current) drug therapy: Secondary | ICD-10-CM | POA: Diagnosis not present

## 2017-11-28 DIAGNOSIS — J8410 Pulmonary fibrosis, unspecified: Secondary | ICD-10-CM | POA: Diagnosis present

## 2017-11-28 DIAGNOSIS — Z7902 Long term (current) use of antithrombotics/antiplatelets: Secondary | ICD-10-CM | POA: Diagnosis not present

## 2017-11-28 DIAGNOSIS — I130 Hypertensive heart and chronic kidney disease with heart failure and stage 1 through stage 4 chronic kidney disease, or unspecified chronic kidney disease: Secondary | ICD-10-CM | POA: Diagnosis not present

## 2017-11-28 DIAGNOSIS — E1122 Type 2 diabetes mellitus with diabetic chronic kidney disease: Secondary | ICD-10-CM | POA: Diagnosis not present

## 2017-11-28 DIAGNOSIS — F419 Anxiety disorder, unspecified: Secondary | ICD-10-CM | POA: Diagnosis not present

## 2017-11-28 DIAGNOSIS — R848 Other abnormal findings in specimens from respiratory organs and thorax: Secondary | ICD-10-CM | POA: Diagnosis not present

## 2017-11-28 DIAGNOSIS — M349 Systemic sclerosis, unspecified: Secondary | ICD-10-CM | POA: Insufficient documentation

## 2017-11-28 DIAGNOSIS — Z951 Presence of aortocoronary bypass graft: Secondary | ICD-10-CM | POA: Insufficient documentation

## 2017-11-28 DIAGNOSIS — I700 Atherosclerosis of aorta: Secondary | ICD-10-CM | POA: Diagnosis not present

## 2017-11-28 DIAGNOSIS — Z7984 Long term (current) use of oral hypoglycemic drugs: Secondary | ICD-10-CM | POA: Diagnosis not present

## 2017-11-28 DIAGNOSIS — I4891 Unspecified atrial fibrillation: Secondary | ICD-10-CM | POA: Diagnosis not present

## 2017-11-28 DIAGNOSIS — Z95810 Presence of automatic (implantable) cardiac defibrillator: Secondary | ICD-10-CM | POA: Insufficient documentation

## 2017-11-28 DIAGNOSIS — Z87442 Personal history of urinary calculi: Secondary | ICD-10-CM | POA: Insufficient documentation

## 2017-11-28 DIAGNOSIS — N189 Chronic kidney disease, unspecified: Secondary | ICD-10-CM | POA: Diagnosis not present

## 2017-11-28 DIAGNOSIS — J849 Interstitial pulmonary disease, unspecified: Secondary | ICD-10-CM | POA: Diagnosis not present

## 2017-11-28 DIAGNOSIS — G4733 Obstructive sleep apnea (adult) (pediatric): Secondary | ICD-10-CM | POA: Diagnosis not present

## 2017-11-28 DIAGNOSIS — F329 Major depressive disorder, single episode, unspecified: Secondary | ICD-10-CM | POA: Diagnosis not present

## 2017-11-28 DIAGNOSIS — I5022 Chronic systolic (congestive) heart failure: Secondary | ICD-10-CM | POA: Insufficient documentation

## 2017-11-28 DIAGNOSIS — R69 Illness, unspecified: Secondary | ICD-10-CM | POA: Diagnosis not present

## 2017-11-28 HISTORY — PX: VIDEO BRONCHOSCOPY: SHX5072

## 2017-11-28 LAB — BODY FLUID CELL COUNT WITH DIFFERENTIAL
EOS FL: 8 %
Lymphs, Fluid: 12 %
MONOCYTE-MACROPHAGE-SEROUS FLUID: 70 % (ref 50–90)
NEUTROPHIL FLUID: 10 % (ref 0–25)
Total Nucleated Cell Count, Fluid: 137 cu mm (ref 0–1000)

## 2017-11-28 LAB — GLUCOSE, CAPILLARY: Glucose-Capillary: 122 mg/dL — ABNORMAL HIGH (ref 65–99)

## 2017-11-28 SURGERY — VIDEO BRONCHOSCOPY WITHOUT FLUORO
Anesthesia: Moderate Sedation | Laterality: Bilateral

## 2017-11-28 MED ORDER — FENTANYL CITRATE (PF) 100 MCG/2ML IJ SOLN
INTRAMUSCULAR | Status: AC
  Filled 2017-11-28: qty 4

## 2017-11-28 MED ORDER — LIDOCAINE HCL URETHRAL/MUCOSAL 2 % EX GEL
CUTANEOUS | Status: DC | PRN
  Administered 2017-11-28: 1

## 2017-11-28 MED ORDER — SODIUM CHLORIDE 0.9 % IV SOLN
INTRAVENOUS | Status: DC
  Administered 2017-11-28: 14:00:00 via INTRAVENOUS

## 2017-11-28 MED ORDER — PHENYLEPHRINE HCL 0.25 % NA SOLN
NASAL | Status: DC | PRN
  Administered 2017-11-28: 2 via NASAL

## 2017-11-28 MED ORDER — MIDAZOLAM HCL 5 MG/ML IJ SOLN
INTRAMUSCULAR | Status: AC
  Filled 2017-11-28: qty 2

## 2017-11-28 MED ORDER — LIDOCAINE HCL (PF) 1 % IJ SOLN
INTRAMUSCULAR | Status: DC | PRN
  Administered 2017-11-28: 6 mL

## 2017-11-28 MED ORDER — LIDOCAINE HCL 2 % EX GEL
1.0000 "application " | Freq: Once | CUTANEOUS | Status: DC
  Filled 2017-11-28: qty 5

## 2017-11-28 MED ORDER — FENTANYL CITRATE (PF) 100 MCG/2ML IJ SOLN
INTRAMUSCULAR | Status: DC | PRN
  Administered 2017-11-28 (×2): 25 ug via INTRAVENOUS

## 2017-11-28 MED ORDER — MIDAZOLAM HCL 10 MG/2ML IJ SOLN
INTRAMUSCULAR | Status: DC | PRN
  Administered 2017-11-28 (×2): 2 mg via INTRAVENOUS

## 2017-11-28 MED ORDER — PHENYLEPHRINE HCL 0.25 % NA SOLN: 1.0000 | Freq: Four times a day (QID) | NASAL | Status: DC | PRN

## 2017-11-28 MED ORDER — BUTAMBEN-TETRACAINE-BENZOCAINE 2-2-14 % EX AERO: 1.0000 | INHALATION_SPRAY | Freq: Once | CUTANEOUS | Status: DC

## 2017-11-28 NOTE — Discharge Instructions (Signed)
Flexible Bronchoscopy, Care After These instructions give you information on caring for yourself after your procedure. Your doctor may also give you more specific instructions. Call your doctor if you have any problems or questions after your procedure. Follow these instructions at home:  Do not eat or drink anything for 2 hours after your procedure. If you try to eat or drink before the medicine wears off, food or drink could go into your lungs. You could also burn yourself.  After 2 hours have passed and when you can cough and gag normally, you may eat soft food and drink liquids slowly.  The day after the test, you may eat your normal diet.  You may do your normal activities.  Keep all doctor visits. Get help right away if:  You get more and more short of breath.  You get light-headed.  You feel like you are going to pass out (faint).  You have chest pain.  You have new problems that worry you.  You cough up more than a little blood.  You cough up more blood than before. This information is not intended to replace advice given to you by your health care provider. Make sure you discuss any questions you have with your health care provider. Document Released: 04/16/2009 Document Revised: 11/25/2015 Document Reviewed: 02/21/2013 Elsevier Interactive Patient Education  2017 [place] not eat or drink until after 4:15 today 11/28/17  FOLLOWUP Future Appointments  Date Time Provider [place]  12/03/2017  1:15 PM [facility] PHARMACY [facility] None  12/11/2017  2:00 PM Brand Males, MD [facility] None  12/19/2017  9:40 AM [facility] DEVICE REMOTES [facility]  12/23/2017  8:00 PM [facility] ROOM 3 [facility] MSD  01/07/2018 11:00 AM Larey Dresser, MD [facility] None  03/18/2018  1:45 PM Deboraha Sprang, MD [facility]

## 2017-11-28 NOTE — Interval H&P Note (Signed)
History and Physical Interval Note:  11/28/2017 2:01 PM  Vincent Chandler  has presented today for surgery, with the diagnosis of ILD  The various methods of treatment have been discussed with the patient and family. After consideration of risks, benefits and other options for treatment, the patient has consented to  Procedure(s): VIDEO BRONCHOSCOPY WITHOUT FLUORO (Bilateral) as a surgical intervention .  The patient's history has been reviewed, patient examined, no change in status, stable for bronch with bAL. Only issue is left submandibular swelling due to ingrowing hair nail and is on doxyc.  I have reviewed the patient's chart and labs.  Questions were answered to the patient's satisfaction.     Vincent Chandler

## 2017-11-28 NOTE — Op Note (Signed)
Name:  [name] MRN:  [id number] DOB:  27-Oct-1960  PROCEDURE NOTE  Procedure(s): Flexible bronchoscopy [phone]) Bronchial alveolar lavage [phone]) of the right lower llob4   Indications:  ILD  Consent:  Procedure, benefits, risks and alternatives discussed.  Questions answered.  Consent obtained.  Anesthesia:  Moderate Sedation  Location: [place] opd bronch suite  Procedure summary:  Appropriate equipment was assembled.  The patient was brought to the procedure suite room and identified as Vincent Chandler with Oct 22, 1960  Safety timeout was performed. The patient was placed supine on the  table, airway and moderate sedation administered by this operator  After the appropriate level of moderation was assured, flexible video bronchoscope was lubricated and inserted through the endotracheal tube.  Total of 18 mL of 1% Lidocaine were administered through the bronchoscope to augment moderate sedation  Airway examination was not performed bilaterally to subsegmental level but ws just restricted to trachear, carina, RMB and RUL, RML And RLL entrances due to need to minimize sedation time.  Minimal clear secretions were noted, mucosa appeared normal and no endobronchial lesions were identified.  Bronchial alveolar lavage of the right lower  was performed with 140 mL of normal saline in 22m x 1 and then 432maliquots x 3 number of times. Total return of 60 mL of fluid - cloudy grey, after which the bronchoscope was withdrawn.  The patient was recovered and then  transferred to recovery area  Post-procedure chest x-ray was not needed and so not ordered.  Specimens sent: Bronchial alveolar lavage specimen of the right lower lobe for cell count (including CD4/CD8 assessment), microbiology and cytology.  Complications:  No immediate complications were noted.  Hemodynamic parameters and oxygenation remained stable throughout the procedure.  Estimated blood loss:  none  IMPRESSION 1.  ILD with normal right sided airways. Left not examined 2. RLL lavage  Followup Future Appointments  Date Time Provider [place]6/08/2017  1:15 PM [facility] PHARMACY [facility] None  12/11/2017  2:00 PM RaBrand MalesMD [facility] None  12/19/2017  9:40 AM [facility] DEVICE REMOTES [facility]  12/23/2017  8:00 PM MSD-SLE[facility] ROOM 3 [facility] MSD  01/07/2018 11:00 AM McLarey DresserMD [facility] None  03/18/2018  1:45 PM KlDeboraha SprangMD [facility]     Dr. MuBrand MalesM.D., F.C.C.P Pulmonary and Critical Care Medicine Staff Physician [facility] Pager: [phone] no answer or between  15:00h - 7:00h: call [phone]  11/28/2017 2:15 PM

## 2017-11-28 NOTE — Interval H&P Note (Signed)
History and Physical Interval Note:  11/28/2017 2:03 PM  Risks of pneumothorax, hemothorax, sedation/anesthesia complications such as cardiac or respiratory arrest or hypotension, stroke and bleeding all explained. Benefits of diagnosis but limitations of non-diagnosis also explained. Patient verbalized understanding and wished to proceed.    Dr. Brand Males, M.D., [facility].C.P Pulmonary and Critical Care Medicine Staff Physician, [place] Director - Interstitial Lung Disease  Program  Pulmonary [place] [facility] [place], [place], [address]  Pager: [phone], If no answer or between  15:00h - 7:00h: call [phone] Telephone: [phone]

## 2017-11-28 NOTE — Progress Notes (Signed)
Video bronchoscopy performed Intervention bronchial washings Pt tolerated well  Kathie Dike RRT

## 2017-11-28 NOTE — Telephone Encounter (Signed)
Received a call from [facility] denying request for in lab CPAP titration. They recommend APAP or MD can do a peer to peer by calling [phone] within 21 days from 11/27/17. Message will be routed to Dr Claiborne Billings for review and further recommendation.

## 2017-11-29 ENCOUNTER — Telehealth: Payer: Self-pay | Admitting: Internal Medicine

## 2017-11-29 LAB — PNEUMOCYSTIS JIROVECI SMEAR BY DFA: Pneumocystis jiroveci Ag: NEGATIVE

## 2017-11-29 NOTE — Telephone Encounter (Signed)
The patient was seen in office by Dr. Radford Pax on 11/20/17 and scheduled for sleep study

## 2017-11-29 NOTE — Telephone Encounter (Signed)
BAL shows mild eosinophilia 8% (> 5% is abnormal). Ddx per uptodate  High count (?25 percent) Chronic eosinophilic pneumonia (?40 percent) Churg Strauss syndrome with active pneumonitis Idiopathic acute eosinophilic pneumonia (?25 percent) Tropical pulmonary eosinophilia (40 to 70 percent)   Mild to moderate counts (<25 percent) Connective tissue disease Drug-induced pneumonitis Fungal pneumonia Idiopathic pulmonary fibrosis (<10 percent) Pulmonary Langerhans cell histiocytosis (Histiocytosis X) Sarcoidosis

## 2017-11-30 NOTE — Telephone Encounter (Signed)
Bronch completed 70% macs, 8% eos

## 2017-11-30 NOTE — Telephone Encounter (Signed)
Message sent to Dr Claiborne Billings in error.

## 2017-12-01 LAB — CULTURE, BAL-QUANTITATIVE W GRAM STAIN: Special Requests: NORMAL

## 2017-12-01 LAB — CULTURE, BAL-QUANTITATIVE: CULTURE: NORMAL — AB

## 2017-12-03 ENCOUNTER — Ambulatory Visit (HOSPITAL_COMMUNITY)
Admission: RE | Admit: 2017-12-03 | Discharge: 2017-12-03 | Disposition: A | Discharge status: Home / Self Care | Payer: Medicare HMO | Source: Ambulatory Visit | Attending: Internal Medicine | Admitting: Internal Medicine

## 2017-12-03 DIAGNOSIS — I272 Pulmonary hypertension, unspecified: Secondary | ICD-10-CM | POA: Diagnosis not present

## 2017-12-03 DIAGNOSIS — Z951 Presence of aortocoronary bypass graft: Secondary | ICD-10-CM | POA: Insufficient documentation

## 2017-12-03 DIAGNOSIS — I259 Chronic ischemic heart disease, unspecified: Secondary | ICD-10-CM | POA: Diagnosis not present

## 2017-12-03 DIAGNOSIS — I2510 Atherosclerotic heart disease of native coronary artery without angina pectoris: Secondary | ICD-10-CM | POA: Insufficient documentation

## 2017-12-03 DIAGNOSIS — Z9889 Other specified postprocedural states: Secondary | ICD-10-CM | POA: Diagnosis not present

## 2017-12-03 DIAGNOSIS — E1122 Type 2 diabetes mellitus with diabetic chronic kidney disease: Secondary | ICD-10-CM | POA: Diagnosis not present

## 2017-12-03 DIAGNOSIS — J8410 Pulmonary fibrosis, unspecified: Secondary | ICD-10-CM | POA: Diagnosis not present

## 2017-12-03 DIAGNOSIS — G4733 Obstructive sleep apnea (adult) (pediatric): Secondary | ICD-10-CM | POA: Insufficient documentation

## 2017-12-03 DIAGNOSIS — N183 Chronic kidney disease, stage 3 (moderate): Secondary | ICD-10-CM | POA: Insufficient documentation

## 2017-12-03 DIAGNOSIS — I4892 Unspecified atrial flutter: Secondary | ICD-10-CM | POA: Insufficient documentation

## 2017-12-03 DIAGNOSIS — I5022 Chronic systolic (congestive) heart failure: Secondary | ICD-10-CM | POA: Diagnosis not present

## 2017-12-03 LAB — MTB NAA WITHOUT AFB CULTURE: M Tuberculosis, Naa: NEGATIVE

## 2017-12-03 NOTE — Progress Notes (Signed)
HF MD: [name]  HPI: [name] is a 57 year old with history of A flutter, chronic systolic heart failure, CAD s/p CABG, CKD Stage III, DMII, OSA.   Admitted 08/07/17 with atrial flutter/RVR and acute on chronic systolic heart failure. Underwent successful DC/CV on 2/8. Required short term milrinone but was able to wean off. HF meds started. Echo thatadmission with EF 15%. D/C weight 201 pounds.  CPX in 2/19 showed severe functional impairment due to heart failure. However, PFTs were restrictive and high resolution CT chest was concerning for interstitial lung disease.He has had atrial flutter ablation.   He had RHC in 4/19 showing preserved cardiac output and low filling pressures, but moderate pulmonary hypertension.  He presents today for pharmacist-led medication titration. At last visit pt was up ~2 lbs from dietary indiscretion at beach trip so he took Lasix for 2 days and then increased carvedilol to 12.54m BID. Pt has lost about 4 lbs since last visit and notes that he has had less appetite recently but states he is not dizzy, last renal function stable, has had no dark tarry stools. He has been coughing up clear phlegm since BAL by pulmonary last week. Pt denies vomiting or diarrhea or fevers/chills. ReDS Vest reading in clinic was 38% - in setting of w/u for ILD.     Shortness of breath/dyspnea on exertion? One episode of SOB after waking up from nap but this was transient   Orthopnea/PND? no  Edema? no  Lightheadedness/dizziness? no  Daily weights at home? Yes but not in over a week (last weight at home was 198 lbs)  Blood pressure/heart rate monitoring at home? no  Following low-sodium/fluid-restricted diet? yes  HF Medications: -Carvedilol 12.527mBID -Digoxin 0.062556maily -Entresto 49-51 BID -Spironolactone 58m38mily -Furosemide 40mg53mly as needed(pt has only ever taken once)   Has the patient been experiencing any side effects to the medications  prescribed?  no  Does the patient have any problems obtaining medications due to transportation or finances?   no  Understanding of regimen: good Understanding of indications: good Potential of compliance: good Patient understands to avoid NSAIDs. Patient understands to avoid decongestants.   Pertinent Lab Values:  5/20:SCr1.24mg/41mCO2 23mmol79mPotassium 4.5mmol/L65modium 138mmol/L26m30:SCr1.49 mg/dl, CO225 mmol/L, Potassium5.1 mmol/L, Sodium138 mmol/L   Vital Signs:  Weight: 194.2 lbs (dry weight: ~198 lbs)  Blood pressure: 110/68 mmHg   Heart rate: 68 bpm   Assessment: 1. Chronicsystolic CHF (EF15%), dRR11%ischemia. NYHA classIIsymptoms. -ReDS Vest reading 38% in clinic - however, given likely diagnosis of ILD this reading is likely overestimating volume status. Pt appears euvolemic on exam with no edema and recent weight loss. -Pt likely has acute viral illness - will make no adjustments today to medications. -Continue carvedilol 12.5mg BID, 67mresto 49/51mg BID, 46mxin 0.0658mg daily,52m spironolactone 58mg daily. 55mtinue taking furosemide as needed. -Basic disease state pathophysiology, medication indication, mechanism and side effects reviewed at length with patient and he verbalized understanding  2. Atrial flutter: Paroxysmal s/p DCCV 2/19 and ablation 3/19 -He is now off amiodarone (question of possible lung toxicity). -Continue Eliquis 5 mg bid -Pt has had recent bleeding with hemorrhoids but denies dark tarry stools 3.CAD s/p CABG. Last intervention was PCI toLCx in 2016 -Continue atorvastatin -No ASA with Eliquis use. 4. Pulmonary fibrosis:PFTs in 2/19 were restrictive, and high resolution CT showed interstitial lung disease.Amiodarone was stopped due to concern for possible toxicity. -Possible amiodarone lung toxicity, he is now off amiodarone -Dr. RamaswamyChase Caller  following, negative autoimmune w/u, BAL performed last week  with results in process 5. OSA: -Appointment completed with Dr. Radford Pax, sleep study pending 6. CKDIII: -Cr stable on BMET 5/20 and CMET 5/21 7. Pulmonary hypertension:Suspect group 3 PH due to lung disease (ILD).   Plan: 1) Medication changes: Based on clinical presentation, vital signs and recent labs will continue current regimen noted above.  2) Labs: none today, BMET at MD follow-up 3) Follow-up: Follow-up with Dr. [name] 01/08/18   Bonnita Nasuti Pharm.D. CPP, BCPS Clinical Pharmacist [phone] 12/03/2017 5:09 PM

## 2017-12-03 NOTE — Patient Instructions (Addendum)
It was great to see you today.  No changes to your medications today.  Follow-up with Dr. [name] on 01/07/18 at 11:00

## 2017-12-04 MED FILL — SPIRONOLACTONE 25 MG TABLET: 25 | 30 days supply | Qty: 30 | Fill #1

## 2017-12-04 MED FILL — CARVEDILOL 12.5 MG TABLET: 12.5 | 30 days supply | Qty: 60 | Fill #0

## 2017-12-11 ENCOUNTER — Ambulatory Visit: Payer: Self-pay | Admitting: Internal Medicine

## 2017-12-12 ENCOUNTER — Other Ambulatory Visit (HOSPITAL_COMMUNITY): Payer: Self-pay

## 2017-12-12 NOTE — Progress Notes (Signed)
Paramedicine Encounter    Patient ID: Vincent Chandler, male    DOB: 06/08/1961, 57 y.o.   MRN: [id number]   Patient Care Team: [name], Alferd Apa, DO as PCP - General (Family Medicine) Burtis Junes, NP as Nurse Practitioner (Nurse Practitioner) Deboraha Sprang, MD as Consulting Physician (Cardiology) Allyn Kenner, MD (Dermatology)  Patient Active Problem List   Diagnosis Date Noted  . ILD (interstitial lung disease) ([place])   . Chronic periodontitis 08/30/2017  . Atrial flutter ([place]) 08/08/2017  . Dyspnea 08/07/2017  . Hyperglycemia 08/07/2017  . CKD (chronic kidney disease) 2-3 07/19/2014  . NSTEMI (non-ST elevated myocardial infarction) ([place]) 07/18/2014  . OSA (obstructive sleep apnea) 07/18/2014  . Tachycardia   . V tach ([place]) 07/17/2014  . Hypoxemia 07/17/2014  . CHF (congestive heart failure) ([place]) 06/04/2012  . Sinus tachycardia 12/29/2010  . Coronary artery disease prior RCA stent with new inferior Q waves 10/18/2010  . Ischemic and nonischemic cardiomyopathy   10/18/2010  . Uncontrolled type 2 diabetes mellitus without complication, without long-term current use of insulin ([place]) 03/10/2010  . Hyperlipidemia 03/10/2010  . Essential hypertension 03/10/2010    Current Outpatient Medications:  .  ACCU-CHEK FASTCLIX LANCETS MISC, Use as directed once a day.  Dx code: E11.9, Disp: 100 each, Rfl: 1 .  apixaban (ELIQUIS) 5 MG TABS tablet, Take 1 tablet (5 mg total) by mouth 2 (two) times daily., Disp: 180 tablet, Rfl: 3 .  atorvastatin (LIPITOR) 40 MG tablet, Take 1 tablet (40 mg total) by mouth at bedtime., Disp: 90 tablet, Rfl: 3 .  blood glucose meter kit and supplies, Dispense based on patient and insurance preference. Use as directed once a day. Dx Code E11.9., Disp: 1 each, Rfl: 0 .  Blood Glucose Monitoring Suppl (ACCU-CHEK GUIDE) w/Device KIT, 1 each by Does not apply route daily. DX Code: E11.9, Disp: 1 kit, Rfl: 0 .  carvedilol (COREG) 12.5 MG tablet, Take 1 tablet  (12.5 mg total) by mouth 2 (two) times daily with a meal., Disp: 60 tablet, Rfl: 3 .  digoxin (LANOXIN) 0.125 MG tablet, Take 0.0625 mg by mouth daily., Disp: , Rfl:  .  furosemide (LASIX) 40 MG tablet, Take 1 tablet (40 mg total) by mouth daily as needed., Disp: 30 tablet, Rfl: 11 .  glucose blood (ACCU-CHEK GUIDE) test strip, Use as directed once a day.  Dx code: E11.9, Disp: 100 each, Rfl: 1 .  guaiFENesin (MUCINEX) 600 MG 12 hr tablet, Take 1 tablet (600 mg total) by mouth 2 (two) times daily as needed for to loosen phlegm., Disp: , Rfl:  .  metFORMIN (GLUCOPHAGE) 500 MG tablet, Take 1 tablet (500 mg total) by mouth 2 (two) times daily with a meal., Disp: 60 tablet, Rfl: 3 .  mometasone-formoterol (DULERA) 200-5 MCG/ACT AERO, Inhale 2 puffs into the lungs 2 (two) times daily. Further refills per PCP, Disp: 1 Inhaler, Rfl: 0 .  sacubitril-valsartan (ENTRESTO) 49-51 MG, Take 1 tablet by mouth 2 (two) times daily., Disp: 30 tablet, Rfl: 0 .  spironolactone (ALDACTONE) 25 MG tablet, Take 1 tablet (25 mg total) by mouth daily., Disp: 30 tablet, Rfl: 3 .  doxycycline (VIBRA-TABS) 100 MG tablet, Take 1 tablet (100 mg total) by mouth 2 (two) times daily. (Patient not taking: Reported on 12/12/2017), Disp: 20 tablet, Rfl: 0 .  nitroGLYCERIN (NITROSTAT) 0.4 MG SL tablet, Place 1 tablet (0.4 mg total) under the tongue every 5 (five) minutes as needed for chest pain. (Patient not  taking: Reported on 11/21/2017), Disp: 25 tablet, Rfl: 3 No Known Allergies    Social History   Socioeconomic History  . Marital status: Divorced    Spouse name: n/a  . Number of children: 0  . Years of education: 12th grade  . Highest education level: Not on file  Occupational History  . Occupation: Data processing manager ---self employed    Employer: [employer]  Social Needs  . Financial resource strain: Not on file  . Food insecurity:    Worry: Not on file    Inability: Not on file  .  Transportation needs:    Medical: Not on file    Non-medical: Not on file  Tobacco Use  . Smoking status: Never Smoker  . Smokeless tobacco: Current User    Types: Chew  Substance and Sexual Activity  . Alcohol use: Yes    Alcohol/week: 1.8 oz    Types: 3 Glasses of wine per week  . Drug use: No  . Sexual activity: Not Currently    Partners: Female  Lifestyle  . Physical activity:    Days per week: Not on file    Minutes per session: Not on file  . Stress: Not on file  Relationships  . Social connections:    Talks on phone: Not on file    Gets together: Not on file    Attends religious service: Not on file    Active member of club or organization: Not on file    Attends meetings of clubs or organizations: Not on file    Relationship status: Not on file  . Intimate partner violence:    Fear of current or ex partner: Not on file    Emotionally abused: Not on file    Physically abused: Not on file    Forced sexual activity: Not on file  Other Topics Concern  . Not on file  Social History Narrative   Exercise-- walking    Lives alone.   Brother lives in [place], [place]    Physical Exam      Future Appointments  Date Time Provider [place]  12/19/2017  9:40 AM [facility] DEVICE REMOTES [facility]  12/23/2017  8:00 PM MSD-SLE[facility] ROOM 3 [facility] MSD  12/27/2017  2:30 PM Brand Males, MD [facility] None  01/07/2018 11:00 AM Larey Dresser, MD [facility] None  03/18/2018  1:45 PM Deboraha Sprang, MD [facility]    BP 110/68   Pulse 67   Resp 14   SpO2 95%  Friday @ office-194  Weight yesterday-? Last visit weight-200 [facility]-158  Pt reports he has been feeling ok, he has been having a productive congestion/phlegm down in his chest.  He missed his pulmonologist appointment.  He had an episode of dizziness on Monday-he reports he was coughing up that phlegm when it started-possibly a vagal response from the coughing-when he  was done it quickly passed. No sob upon exertion. Doing well on steps. He feels congested--has sleep study planned. Pt is able to manage medicines and condition independently, pt is able to graduate from the paramedicine program.    Marylouise Stacks, [place] Paramedic  12/12/17

## 2017-12-17 ENCOUNTER — Other Ambulatory Visit: Payer: Self-pay | Admitting: *Deleted

## 2017-12-17 DIAGNOSIS — E1151 Type 2 diabetes mellitus with diabetic peripheral angiopathy without gangrene: Secondary | ICD-10-CM

## 2017-12-17 MED ORDER — METFORMIN HCL 500 MG PO TABS: 500.0000 mg | ORAL_TABLET | Freq: Two times a day (BID) | ORAL | 0 refills | Status: DC

## 2017-12-17 MED FILL — DIGOXIN 0.125 MG TABLET: 125 | 30 days supply | Qty: 30 | Fill #3

## 2017-12-17 MED FILL — metFORMIN HCL 500 MG TABS: 500 | 30 days supply | Qty: 60 | Fill #0

## 2017-12-18 ENCOUNTER — Telehealth: Payer: Self-pay | Admitting: *Deleted

## 2017-12-18 NOTE — Telephone Encounter (Signed)
rx sent in yesterday

## 2017-12-18 NOTE — Telephone Encounter (Signed)
Copied from [place] [phone]. Topic: General - Other >> Dec 17, 2017  2:31 PM Keene Breath wrote: Reason for CRM: Manuela Schwartz from [place] called stating that the insurance company wants patient to get 90 supply at a time of metFORMIN (GLUCOPHAGE) 500 MG tablet instead of the 30day supply.  CB# [phone], Fax#[phone].

## 2017-12-19 ENCOUNTER — Ambulatory Visit (INDEPENDENT_AMBULATORY_CARE_PROVIDER_SITE_OTHER): Payer: Medicare HMO | Admitting: *Deleted

## 2017-12-19 DIAGNOSIS — I255 Ischemic cardiomyopathy: Secondary | ICD-10-CM

## 2017-12-19 LAB — CULTURE, FUNGUS WITHOUT SMEAR: Special Requests: NORMAL

## 2017-12-19 NOTE — Progress Notes (Signed)
Remote ICD transmission.   

## 2017-12-21 LAB — CUP PACEART REMOTE DEVICE CHECK
Battery Remaining Longevity: 119 mo
Brady Statistic RV Percent Paced: 0.01 %
Date Time Interrogation Session: 20190619084224
HIGH POWER IMPEDANCE MEASURED VALUE: 72 Ohm
Implantable Lead Serial Number: 382379
Lead Channel Impedance Value: 494 Ohm
Lead Channel Impedance Value: 494 Ohm
Lead Channel Pacing Threshold Pulse Width: 0.4 ms
Lead Channel Sensing Intrinsic Amplitude: 8.875 mV
Lead Channel Sensing Intrinsic Amplitude: 8.875 mV
MDC IDC LEAD IMPLANT DT: 20160715
MDC IDC LEAD LOCATION: 753860
MDC IDC MSMT BATTERY VOLTAGE: 3.01 V
MDC IDC MSMT LEADCHNL RV PACING THRESHOLD AMPLITUDE: 0.75 V
MDC IDC PG IMPLANT DT: 20160715
MDC IDC SET LEADCHNL RV PACING AMPLITUDE: 2.5 V
MDC IDC SET LEADCHNL RV PACING PULSEWIDTH: 0.4 ms
MDC IDC SET LEADCHNL RV SENSING SENSITIVITY: 0.3 mV

## 2017-12-23 ENCOUNTER — Ambulatory Visit (HOSPITAL_BASED_OUTPATIENT_CLINIC_OR_DEPARTMENT_OTHER): Payer: Medicare HMO | Attending: Cardiology

## 2017-12-24 ENCOUNTER — Telehealth: Payer: Self-pay | Admitting: *Deleted

## 2017-12-24 NOTE — Telephone Encounter (Signed)
Patient called today to say he missed his sleep study appointment scheduled for 12/23/17 because he did not know where to go. I gave the patient the contact information for the sleep lab so he can get his sleep study rescheduled.Pt is aware and agreeable to this treatment.

## 2017-12-27 ENCOUNTER — Ambulatory Visit: Payer: Medicare HMO | Admitting: Internal Medicine

## 2017-12-27 ENCOUNTER — Encounter: Payer: Self-pay | Admitting: Internal Medicine

## 2017-12-27 VITALS — BP 128/74 | HR 66 | Ht 69.0 in | Wt 188.6 lb

## 2017-12-27 DIAGNOSIS — R062 Wheezing: Secondary | ICD-10-CM

## 2017-12-27 DIAGNOSIS — R05 Cough: Secondary | ICD-10-CM | POA: Diagnosis not present

## 2017-12-27 DIAGNOSIS — J849 Interstitial pulmonary disease, unspecified: Secondary | ICD-10-CM | POA: Diagnosis not present

## 2017-12-27 DIAGNOSIS — Z9229 Personal history of other drug therapy: Secondary | ICD-10-CM | POA: Diagnosis not present

## 2017-12-27 DIAGNOSIS — R053 Chronic cough: Secondary | ICD-10-CM

## 2017-12-27 LAB — POCT EXHALED NITRIC OXIDE: FENO LEVEL (PPB): 39

## 2017-12-27 MED ORDER — PREDNISONE 10 MG PO TABS: ORAL_TABLET | ORAL | 1 refills | Status: DC

## 2017-12-27 MED ORDER — PREDNISONE 20 MG PO TABS: 40.0000 mg | ORAL_TABLET | Freq: Every day | ORAL | 5 refills | Status: DC

## 2017-12-27 MED ORDER — ALBUTEROL SULFATE HFA 108 (90 BASE) MCG/ACT IN AERS: 1.0000 | INHALATION_SPRAY | Freq: Four times a day (QID) | RESPIRATORY_TRACT | 3 refills | Status: DC | PRN

## 2017-12-27 MED FILL — PROAIR HFA 90 MCG INHALER: 108 (90 BAS | 25 days supply | Qty: 9 | Fill #0

## 2017-12-27 MED FILL — predniSONE 10 MG TABS: 10 | 28 days supply | Qty: 84 | Fill #0

## 2017-12-27 NOTE — Addendum Note (Signed)
Addended by: Lorretta Harp on: 12/27/2017 03:00 PM   Modules accepted: Orders

## 2017-12-27 NOTE — Patient Instructions (Signed)
ICD-10-CM   1. ILD (interstitial lung disease) ([place]) J84.9   2. History of amiodarone therapy Z92.29   3. Chronic cough R05   4. Wheezing R06.2     Based on bronchoscopy findings I suspect drug-induced pneumonitis - possibly amiodarone  Based on history andphysical exam I think there is still evidence of interstitial lung disease despite being off amiodarone for a few months  The current cough and wheezing could be reflective of amiodarone pulmonary interstitial lung disease or related to [facility] - Given the beneficial effects of Entresto to your heart I recommend that he is still take this as directed by a cardiologist - Meanwhile we will treat interstitial lung disease with prednisone and see if we can wipe this out - please be mindful that prednisone can cause weight gain and affect are congestive heart failure and also increased blood sugars and can affect diabetes  - I have written to both primary care physician and Dr. [name] a cardiologist to keep a closer eye on U in helping manage these issues - start prednisone as follows: 40 mg once daily for 2 weeks and then 20 mg once daily for 2 weeks and then 10 mg once daily to continue  - I expect total course of prednisone to be for few to several months  Follow-up  - In 4-6 weeks do spirometry and diffusion capacity  - 4-6 weeks in  interstitial lung disease clinic with [name]

## 2017-12-27 NOTE — Addendum Note (Signed)
Addended by: Lorretta Harp on: 12/27/2017 02:57 PM   Modules accepted: Orders

## 2017-12-27 NOTE — Progress Notes (Signed)
Subjective:     Patient ID: Vincent Chandler, male   DOB: Jan 16, 1961, 57 y.o.   MRN: [id number]  HPI  PCP [name], Alferd Apa, DO  HPI  IOV 09/25/2017  Chief Complaint  Patient presents with  . Consult    Referred by Dr. Loralie Champagne for ILD.  HRCT 3/19/189.  Pt denies any complaint of cough, SOB or CP.     57 year old male with chronic systolic heart failure secondary to ischemic heart disease with chronic kidney disease and diabetes and obstructive sleep apnea not yet on CPAP treatment.  This concerned now that he has interstitial lung disease/pulmonary fibrosis not otherwise specified and therefore has been referred here by the cardiology heart failure team  History is gained from talking to the patient and review of the chart.  According to him he had coronary artery bypass grafting in 2014 that then left him with chronic systolic heart failure.  Then in 2016 he had a stent placed and had an admission for that (CT angio  At that time - effusion +/- interistial prominence).  After that he has been admission free up until early February 2019 when he was admitted for acute on chronic systolic heart failure.  During this time he was in atrial fibrillation and then according to his history was loaded with IV amiodarone [it appears he might of failed cardioversion] he was diuresed and discharged on oral amiodarone on August 17, 2017 after a 10-day hospital stay..  With the diuresis he continued to feel better and his shortness of breath and cough improved although he had significant improvement with shortness of breath more than cough.  Just prior to discharge from the hospital he had CT chest (non-HRCT that suggested ILD). Pulmonary function testing same day 08/16/17 that showed moderate restriction with significant severe reduction in diffusion capacity.  Then on August 20, 2017 he had a transesophageal echocardiogram with ejection fraction 15%.  This was then followed with a pulmonary stress  test on August 24, 2017 that showed a VO2 max of 12 with high risk heart failure features.  He did desaturate below 90%. Then on September 12, 2017 he had status post ablation for his atrial fibrillation and was able to come off the amiodarone.  Since coming off the amiodarone his cough is continued to improve and he relates his improvement in cough to stopping the amiodarone.  Then in 09/17/17 - > HRCT  - read by Thoracic radiology as Probable UIP (> 50-80% this is UIP; review of prior CT shows heart failure features - so this is new finding. Personally visualized CT).   And on 09/25/2017 => walk test - Walking desaturation test on 09/25/2017 185 feet x 3 laps on ROOM AIR:  did walk all 3 at normal pace. Did not have complaints. Didnot desaturate. Rest pulse ox was 98%, final pulse ox was 90%. HR response 72/min at rest to 88/min at peak exertion. Patient Vincent Chandler  Did no6 Desaturate < 88% . [name] did yes  Desaturated </= 3% points. [name] did not get tachyardic. These features are c/w MILD/EARLY ILD   Noted: he is considering for LVAD but CVTS feels he is high risk due to ILD and RV failure. Not clear if transplant is part of his goal   ACCP ILD question  - cough x 6 months. 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 No honeycombing inspiratory and expiratory  imaging no acute consolidative airspace disease. No pleural effusions. No suspicious appearing pulmonary nodules or masses are noted.  1. The appearance of the lungs is compatible with interstitial lung disease. The pattern is considered a probable usual interstitial pneumonia (UIP) CT pattern, however, at this time, no definitive honeycombing is identified. Outpatient referral to Pulmonology for further evaluation is suggested in the near future if not already obtained. Repeat high-resolution chest CT is recommended in 12 months to assess for temporal changes in the appearance of the lung parenchyma. 2. Aortic atherosclerosis, in addition to 3 vessel coronary artery disease. Patient is status post median sternotomy for CABG including LIMA to the LAD. 3. Additional incidental findings, as above.  Aortic Atherosclerosis (ICD10-I70.0).   Electronically Signed   By: Vinnie Langton M.D.   [name] 10/30/2017  Chief Complaint  Patient presents with  . Follow-up    PFT done today. Pt states he has been doing well since last visit and denies any complaints or concerns other than some mild back pain.     Walking desaturation test on 10/30/2017 185 feet x 3 laps on ROOM AIR:  did not  Desaturate to < 88%. Rest pulse ox was 97%, final pulse ox was 90%. HR response 60/min at rest to 70/min at peak exertion. Patient Vincent Chandler  Did not Desaturate < 88% . [name] did yes  Desaturated </= 3% points. [name] did not get tachyardic. Moderate pace . Denied complaints  Results for Vincent Chandler, Vincent Chandler (MRN [id number]) as of 10/30/2017 17:01  Ref. Range 09/25/2017 17:03  ASPERGILLUS FUMIGATUS Latest Ref Range: NEGATIVE  NEGATIVE  [place] Serum Latest Ref Range: NEGATIVE  NEGATIVE  Anit Nuclear Antibody(ANA) Latest Ref Range: Negative  Negative  Cyclic Citrullin Peptide Ab Latest Units: UNITS <16  ds DNA Ab Latest Units: IU/mL 5 (H)  Myeloperoxidase Abs Latest Units: AI <1.0  Serine Protease 3  Latest Units: AI 2.9 (H)  RA Latex Turbid. Latest Ref Range: <14 IU/mL <14  SSA (Ro) (ENA) Antibody, IgG Latest Ref Range: <1.0 NEG AI <1.0 NEG  SSB (La) (ENA) Antibody, IgG Latest Ref Range: <1.0 NEG AI <1.0 NEG  Scleroderma (Scl-70) (ENA) Antibody, IgG Latest Ref Range: <1.0 NEG AI <1.0 NEG   Results for Vincent Chandler, Vincent Chandler (MRN [id number]) as of 10/30/2017 17:01  Ref. Range 08/16/2017 14:34 10/30/2017 14:45  FVC-Pre Latest Units: L 2.89 2.81  FVC-%Pred-Pre Latest Units: % 61 59    Results for Vincent Chandler, Vincent Chandler (MRN [id number]) as of 10/30/2017 17:01  Ref. Range 08/16/2017 14:34 10/30/2017 14:45  TLC Latest Units: L 4.09 3.85  TLC % pred Latest Units: % 60 56   Results for Vincent Chandler, Vincent Chandler (MRN [id number]) as of 10/30/2017 17:01  Ref. Range 09/25/2017 17:03  Sed Rate Latest Ref Range: 0 - 20 mm/hr 18   RHC 10/03/2017  RA mean 5 RV 51/5 PA 54/18, mean 31 PCWP mean 12 Oxygen saturations: PA 66% AO 96% Cardiac Output (  Fick) 5.22  Cardiac Index (Fick) 2.51 PVR 3.6 WU   OV 12/27/2017  Chief Complaint  Patient presents with  . Follow-up    Pt states he has been doing okay. States he has been coughing up clear phlegm x4 weeks and states his nose becomes stopped up at night. Pt also has had c/o wheezing. Denies any chest tightness.    Vincent Chandler , 57 y.o. , with dob May 31, 1961 and male ,Not Hispanic or Latino from [place] [address] - presents to ILD  clinic for followup after bronchoscopy and bronchoalveolar lavage. Results show that he has 70% macro phage [normal is greater than 90% macrophage) and 8% eosinophils [normal is less than 5% eosinophils) which could be reflective of drug induced pneumonitis. Patter is definitely not IPF/UIPO. In the interim he tells me that he speak of a cough now for the last 1 month associated with chest tightness and nocturnal wheezing and sinus drainage. This is definitely new. I did a history retake and he tells me he has been off the  amiodarone for over 3 months. In this time he's also been on Entresto for 3 months [this is known to cause cough]. He feels he needs an inhaler. This no hemoptysis no nausea no vomiting no chills or any other associated symptoms.Marland Kitchen He feels his asthma is coming back. We did exhaled nitric oxide test today - 39 ppb and inderterminate range (< 25 normal, , > 50 abnormal)    Results for Vincent Chandler, Vincent Chandler (MRN [id number]) as of 12/27/2017 14:25  Ref. Range 11/28/2017 14:12  Color, Fluid Latest Ref Range: YELLOW  COLORLESS (A)  WBC, Fluid Latest Ref Range: 0 - 1,000 cu mm 137  Lymphs, Fluid Latest Units: % 12  Eos, Fluid Latest Units: % 8  Appearance, Fluid Latest Ref Range: CLEAR  CLEAR (A)  Other Cells, Fluid Latest Units: % CORRELATE WITH CY...  Neutrophil Count, Fluid Latest Ref Range: 0 - 25 % 10  Monocyte-Macrophage-Serous Fluid Latest Ref Range: 50 - 90 % 70     has a past medical history of AICD (automatic cardioverter/defibrillator) present, Anginal pain ([place]), Anxiety, Asthma, Atrial fibrillation-postoperative (11/28/2012), Cardiomyopathy, CHF (congestive heart failure) ([place]), Chronic back pain, Coronary artery disease, Depression, Diabetes mellitus type II (dx'd in the 1990's), GERD (gastroesophageal reflux disease), H/O hiatal hernia, History of hypogonadism, History of kidney stones, Hyperlipidemia, Hypertension, OSA on CPAP, Pneumonia, and Urinary incontinence.   reports that he has never smoked. His smokeless tobacco use includes chew.  Past Surgical History:  Procedure Laterality Date  . A-FLUTTER ABLATION N/A 09/12/2017   Procedure: A-FLUTTER ABLATION;  Surgeon: Deboraha Sprang, MD;  Location: [facility];  Service: Cardiovascular;  Laterality: N/A;  . CARDIAC DEFIBRILLATOR PLACEMENT  01/15/2015  . CARDIOVERSION N/A 08/10/2017   Procedure: CARDIOVERSION;  Surgeon: Larey Dresser, MD;  Location: [facility] ENDOSCOPY;  Service: Cardiovascular;  Laterality: N/A;  . CORONARY ANGIOPLASTY WITH  STENT PLACEMENT  ~ 2003  . CORONARY ARTERY BYPASS GRAFT N/A 08/15/2012   Procedure: CORONARY ARTERY BYPASS GRAFTING (CABG);  Surgeon: Ivin Poot, MD;  Location: [facility];  Service: Open Heart Surgery;  Laterality: N/A;  Coronary Artery Bypass Grafting Times Four Using Left Internal Mammary Artery and Right Saphenous Leg Vein Harvested Endoscopically  . EP IMPLANTABLE DEVICE N/A 01/15/2015   Procedure: ICD Implant;  Surgeon: Deboraha Sprang, MD;  Location: [facility];  Service: Cardiovascular;  Laterality: N/A;  . LEFT  HEART CATHETERIZATION WITH CORONARY ANGIOGRAM N/A 08/08/2012   Procedure: LEFT HEART CATHETERIZATION WITH CORONARY ANGIOGRAM;  Surgeon: Peter M Martinique, MD;  Location[facility] LAB;  Service: Cardiovascular;  Laterality: N/A;  . LEFT HEART CATHETERIZATION WITH CORONARY/GRAFT ANGIOGRAM N/A 07/21/2014   Procedure: LEFT HEART CATHETERIZATION WITH Beatrix Fetters;  Surgeon: Larey Dresser, MD;  Location: [facility] CATH LAB;  Service: Cardiovascular;  Laterality: N/A;  . MULTIPLE EXTRACTIONS WITH ALVEOLOPLASTY N/A 10/04/2017   Procedure: Extraction of tooth #'s 5 and 14 with alveoloplasty and gross debridement of remaining teeth;  Surgeon: [name], DDS;  Location: [facility];  Service: Oral Surgery;  Laterality: N/A;  . PERCUTANEOUS CORONARY STENT INTERVENTION (PCI-S)  07/21/2014   Procedure: PERCUTANEOUS CORONARY STENT INTERVENTION (PCI-S);  Surgeon: Larey Dresser, MD;  Location: [facility] CATH LAB;  Service: Cardiovascular;;  . REFRACTIVE SURGERY Bilateral 1990's  . RIGHT HEART CATH N/A 10/03/2017   Procedure: RIGHT HEART CATH;  Surgeon: Larey Dresser, MD;  Location: [facility];  Service: Cardiovascular;  Laterality: N/A;  . TEE WITHOUT CARDIOVERSION N/A 08/10/2017   Procedure: TRANSESOPHAGEAL ECHOCARDIOGRAM (TEE);  Surgeon: Larey Dresser, MD;  Location: [facility] ENDOSCOPY;  Service: Cardiovascular;  Laterality: N/A;  . TONSILLECTOMY  ~ 1970  . VIDEO BRONCHOSCOPY Bilateral  11/28/2017   Procedure: VIDEO BRONCHOSCOPY WITHOUT FLUORO;  Surgeon: Brand Males, MD;  Location: [facility] ENDOSCOPY;  Service: Endoscopy;  Laterality: Bilateral;    No Known Allergies  Immunization History  Administered Date(s) Administered  . Influenza Split 06/05/2012  . Influenza Whole 05/09/2010  . Influenza,inj,Quad PF,6+ Mos 04/09/2013, 06/05/2014, 08/17/2017  . Influenza-Unspecified 04/03/2015  . Pneumococcal Conjugate-13 06/05/2014  . Pneumococcal Polysaccharide-23 02/17/2012  . Td 05/09/2010    Family History  Problem Relation Age of Onset  . Hypertension Mother   . Diabetes Mother   . Hypertension Father   . Diabetes Father   . Hyperlipidemia Father      Current Outpatient Medications:  .  ACCU-CHEK FASTCLIX LANCETS MISC, Use as directed once a day.  Dx code: E11.9, Disp: 100 each, Rfl: 1 .  apixaban (ELIQUIS) 5 MG TABS tablet, Take 1 tablet (5 mg total) by mouth 2 (two) times daily., Disp: 180 tablet, Rfl: 3 .  atorvastatin (LIPITOR) 40 MG tablet, Take 1 tablet (40 mg total) by mouth at bedtime., Disp: 90 tablet, Rfl: 3 .  blood glucose meter kit and supplies, Dispense based on patient and insurance preference. Use as directed once a day. Dx Code E11.9., Disp: 1 each, Rfl: 0 .  Blood Glucose Monitoring Suppl (ACCU-CHEK GUIDE) w/Device KIT, 1 each by Does not apply route daily. DX Code: E11.9, Disp: 1 kit, Rfl: 0 .  carvedilol (COREG) 12.5 MG tablet, Take 1 tablet (12.5 mg total) by mouth 2 (two) times daily with a meal., Disp: 60 tablet, Rfl: 3 .  digoxin (LANOXIN) 0.125 MG tablet, Take 0.0625 mg by mouth daily., Disp: , Rfl:  .  furosemide (LASIX) 40 MG tablet, Take 1 tablet (40 mg total) by mouth daily as needed., Disp: 30 tablet, Rfl: 11 .  glucose blood (ACCU-CHEK GUIDE) test strip, Use as directed once a day.  Dx code: E11.9, Disp: 100 each, Rfl: 1 .  guaiFENesin (MUCINEX) 600 MG 12 hr tablet, Take 1 tablet (600 mg total) by mouth 2 (two) times daily as needed for  to loosen phlegm., Disp: , Rfl:  .  metFORMIN (GLUCOPHAGE) 500 MG tablet, Take 1 tablet (500 mg total) by mouth 2 (two) times  daily with a meal., Disp: 180 tablet, Rfl: 0 .  mometasone-formoterol (DULERA) 200-5 MCG/ACT AERO, Inhale 2 puffs into the lungs 2 (two) times daily. Further refills per PCP, Disp: 1 Inhaler, Rfl: 0 .  sacubitril-valsartan (ENTRESTO) 49-51 MG, Take 1 tablet by mouth 2 (two) times daily., Disp: 30 tablet, Rfl: 0 .  spironolactone (ALDACTONE) 25 MG tablet, Take 1 tablet (25 mg total) by mouth daily., Disp: 30 tablet, Rfl: 3 .  nitroGLYCERIN (NITROSTAT) 0.4 MG SL tablet, Place 1 tablet (0.4 mg total) under the tongue every 5 (five) minutes as needed for chest pain. (Patient not taking: Reported on 11/21/2017), Disp: 25 tablet, Rfl: 3   Review of Systems     Objective:   Physical Exam  Constitutional: He is oriented to person, place, and time. He appears well-developed and well-nourished. No distress.  HENT:  Head: Normocephalic and atraumatic.  Right Ear: External ear normal.  Left Ear: External ear normal.  Mouth/Throat: Oropharynx is clear and moist. No oropharyngeal exudate.  Eyes: Pupils are equal, round, and reactive to light. Conjunctivae and EOM are normal. Right eye exhibits no discharge. Left eye exhibits no discharge. No scleral icterus.  Neck: Normal range of motion. Neck supple. No JVD present. No tracheal deviation present. No thyromegaly present.  Cardiovascular: Normal rate, regular rhythm and intact distal pulses. Exam reveals no gallop and no friction rub.  No murmur heard. Pulmonary/Chest: Effort normal. No respiratory distress. He has no wheezes. He has rales. He exhibits no tenderness.  Scattered crackles present  Abdominal: Soft. Bowel sounds are normal. He exhibits no distension and no mass. There is no tenderness. There is no rebound and no guarding.  Musculoskeletal: Normal range of motion. He exhibits no edema or tenderness.  Lymphadenopathy:     He has no cervical adenopathy.  Neurological: He is alert and oriented to person, place, and time. He has normal reflexes. No cranial nerve deficit. Coordination normal.  Skin: Skin is warm and dry. No rash noted. He is not diaphoretic. No erythema. No pallor.  Psychiatric: He has a normal mood and affect. His behavior is normal. Judgment and thought content normal.  Nursing note and vitals reviewed.  Today's Vitals   12/27/17 1419  BP: 128/74  Pulse: 66  SpO2: 91%  Weight: 188 lb 9.6 oz (85.5 kg)  Height: _0  (1.753 m)    Estimated body mass index is 27.85 kg/m as calculated from the following:   Height as of this encounter: _1  (1.753 m).   Weight as of this encounter: 188 lb 9.6 oz (85.5 kg).     Assessment:       ICD-10-CM   1. ILD (interstitial lung disease) ([place]) J84.9   2. History of amiodarone therapy Z92.29   3. Chronic cough R05   4. Wheezing R06.2        Plan:      Based on bronchoscopy findings I suspect drug-induced pneumonitis - possibly amiodarone  Based on history andphysical exam I think there is still evidence of interstitial lung disease despite being off amiodarone for a few months  The current cough and wheezing could be reflective of amiodarone pulmonary interstitial lung disease or related to [facility] - Given the beneficial effects of Entresto to your heart I recommend that he is still take this as directed by a cardiologist - Meanwhile we will treat interstitial lung disease with prednisone and see if we can wipe this out - please be mindful that prednisone can  cause weight gain and affect are congestive heart failure and also increased blood sugars and can affect diabetes  - I have written to both primary care physician and Dr. [name] a cardiologist to keep a closer eye on U in helping manage these issues - start prednisone as follows: 40 mg once daily for 2 weeks and then 20 mg once daily for 2 weeks and then 10 mg once daily to  continue  - I expect total course of prednisone to be for few to several months  Follow-up  - In 4-6 weeks do spirometry and diffusion capacity  - 4-6 weeks in  interstitial lung disease clinic with [name]     > 50% of this > 25 min visit spent in face to face counseling or coordination of care - by this undersigned MD - Dr Brand Males. This includes one or more of the following documented above: discussion of test results, diagnostic or treatment recommendations, prognosis, risks and benefits of management options, instructions, education, compliance or risk-factor reduction  Dr. Brand Males, M.D., [facility].C.P Pulmonary and Critical Care Medicine Staff Physician, [place] Director - Interstitial Lung Disease  Program  Pulmonary [place] [facility] [place], [place], [address]  Pager: [phone], If no answer or between  15:00h - 7:00h: call [phone] Telephone: [phone]

## 2018-01-07 ENCOUNTER — Ambulatory Visit (HOSPITAL_COMMUNITY)
Admission: RE | Admit: 2018-01-07 | Discharge: 2018-01-07 | Disposition: A | Discharge status: Home / Self Care | Payer: Medicare HMO | Source: Ambulatory Visit | Attending: Cardiology | Admitting: Cardiology

## 2018-01-07 VITALS — BP 130/72 | HR 80 | Wt 191.8 lb

## 2018-01-07 DIAGNOSIS — E1322 Other specified diabetes mellitus with diabetic chronic kidney disease: Secondary | ICD-10-CM | POA: Insufficient documentation

## 2018-01-07 DIAGNOSIS — N183 Chronic kidney disease, stage 3 (moderate): Secondary | ICD-10-CM | POA: Insufficient documentation

## 2018-01-07 DIAGNOSIS — I130 Hypertensive heart and chronic kidney disease with heart failure and stage 1 through stage 4 chronic kidney disease, or unspecified chronic kidney disease: Secondary | ICD-10-CM | POA: Insufficient documentation

## 2018-01-07 DIAGNOSIS — I255 Ischemic cardiomyopathy: Secondary | ICD-10-CM

## 2018-01-07 DIAGNOSIS — I4892 Unspecified atrial flutter: Secondary | ICD-10-CM | POA: Diagnosis not present

## 2018-01-07 DIAGNOSIS — Z7901 Long term (current) use of anticoagulants: Secondary | ICD-10-CM | POA: Diagnosis not present

## 2018-01-07 DIAGNOSIS — G4733 Obstructive sleep apnea (adult) (pediatric): Secondary | ICD-10-CM | POA: Insufficient documentation

## 2018-01-07 DIAGNOSIS — Z951 Presence of aortocoronary bypass graft: Secondary | ICD-10-CM | POA: Insufficient documentation

## 2018-01-07 DIAGNOSIS — I2723 Pulmonary hypertension due to lung diseases and hypoxia: Secondary | ICD-10-CM | POA: Insufficient documentation

## 2018-01-07 DIAGNOSIS — I4891 Unspecified atrial fibrillation: Secondary | ICD-10-CM | POA: Diagnosis not present

## 2018-01-07 DIAGNOSIS — J8410 Pulmonary fibrosis, unspecified: Secondary | ICD-10-CM | POA: Diagnosis not present

## 2018-01-07 DIAGNOSIS — J849 Interstitial pulmonary disease, unspecified: Secondary | ICD-10-CM | POA: Diagnosis not present

## 2018-01-07 DIAGNOSIS — I2510 Atherosclerotic heart disease of native coronary artery without angina pectoris: Secondary | ICD-10-CM | POA: Diagnosis not present

## 2018-01-07 DIAGNOSIS — E785 Hyperlipidemia, unspecified: Secondary | ICD-10-CM | POA: Insufficient documentation

## 2018-01-07 DIAGNOSIS — Z79899 Other long term (current) drug therapy: Secondary | ICD-10-CM | POA: Insufficient documentation

## 2018-01-07 DIAGNOSIS — I5022 Chronic systolic (congestive) heart failure: Secondary | ICD-10-CM | POA: Diagnosis not present

## 2018-01-07 DIAGNOSIS — Z8249 Family history of ischemic heart disease and other diseases of the circulatory system: Secondary | ICD-10-CM | POA: Insufficient documentation

## 2018-01-07 DIAGNOSIS — Z7984 Long term (current) use of oral hypoglycemic drugs: Secondary | ICD-10-CM | POA: Insufficient documentation

## 2018-01-07 LAB — BASIC METABOLIC PANEL
ANION GAP: 9 (ref 5–15)
BUN: 35 mg/dL — ABNORMAL HIGH (ref 6–20)
CALCIUM: 9.6 mg/dL (ref 8.9–10.3)
CO2: 25 mmol/L (ref 22–32)
CREATININE: 1.52 mg/dL — AB (ref 0.61–1.24)
Chloride: 105 mmol/L (ref 98–111)
GFR calc non Af Amer: 50 mL/min — ABNORMAL LOW (ref >60)
GFR, EST AFRICAN AMERICAN: 57 mL/min — AB (ref >60)
Glucose, Bld: 343 mg/dL — ABNORMAL HIGH (ref 70–99)
Potassium: 4.7 mmol/L (ref 3.5–5.1)
SODIUM: 139 mmol/L (ref 135–145)

## 2018-01-07 LAB — CBC
HEMATOCRIT: 43.3 % (ref 39.0–52.0)
HEMOGLOBIN: 14.2 g/dL (ref 13.0–17.0)
MCH: 34.6 pg — ABNORMAL HIGH (ref 26.0–34.0)
MCHC: 32.8 g/dL (ref 30.0–36.0)
MCV: 105.6 fL — ABNORMAL HIGH (ref 78.0–100.0)
Platelets: 171 10*3/uL (ref 150–400)
RBC: 4.1 MIL/uL — ABNORMAL LOW (ref 4.22–5.81)
RDW: 13.9 % (ref 11.5–15.5)
WBC: 8.7 10*3/uL (ref 4.0–10.5)

## 2018-01-07 LAB — DIGOXIN LEVEL: DIGOXIN LVL: 0.4 ng/mL — AB (ref 0.8–2.0)

## 2018-01-07 MED ORDER — CARVEDILOL 12.5 MG PO TABS: 18.7500 mg | ORAL_TABLET | Freq: Two times a day (BID) | ORAL | 3 refills | Status: DC

## 2018-01-07 MED ORDER — SPIRONOLACTONE 25 MG PO TABS: 25.0000 mg | ORAL_TABLET | Freq: Every day | ORAL | 3 refills | Status: DC

## 2018-01-07 MED FILL — SPIRONOLACTONE 25 MG TABLET: 25 | 30 days supply | Qty: 30 | Fill #0

## 2018-01-07 MED FILL — CARVEDILOL 12.5 MG TABLET: 12.5 | 25 days supply | Qty: 75 | Fill #0

## 2018-01-07 NOTE — Patient Instructions (Signed)
Increase Carvedilol 18.75 mg (1.5 tabs), twice a day  Labs drawn today (if we do not call you, then your lab work was stable)   You have been referred to Cardiac Rehab (they will call you)    Your physician recommends that you schedule a follow-up appointment in: 1 month Erika, Pharm D   Your physician recommends that you schedule a follow-up appointment in: 2 months with Dr. [name]

## 2018-01-07 NOTE — Progress Notes (Signed)
PCP: Dr. Etter Sjogren Primary HF Cardiologist: Dr [name]  EP: Dr Caryl Comes   HPI: [name] is a 57 year old with history of A flutter, chronic systolic heart failure, CAD s/p CABG, CKD Stage III, DMII, OSA.   Admitted 08/07/17 with atrial flutter/RVR and acute on chronic systolic heart failure. Underwent successful DC/CV on 2/8. Required short term milrinone but was able to wean off. HF meds started. Echo this admission with EF 15%. D/C weight 201 pounds.  CPX in 2/19 showed severe functional impairment due to heart failure. However, PFTs were restrictive and high resolution CT chest was concerning for interstitial lung disease.   He has had atrial flutter ablation.   He had RHC in 4/19 showing preserved cardiac output and low filling pressures, but moderate pulmonary hypertension.   He saw pulmonary regarding interstitial lung disease and had bronchoscopy.  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  He has been started on prednisone, cough is much improved on prednisone.    He saw Dr. Caryl Comes, not planning to upgrade ICD to CRT given IVCD but not LBBB-like.   He returns today for followup of CHF.  Weight is down 7 lbs.  As above, cough resolved for the most part on prednisone.  No ETOH or smoking.  He is able to walk as far as he wants on flat ground without dyspnea.  Able to golf.  Can walk up a flight of stairs.  No chest pain, no palpitations.  No orthopnea/PND.  He takes Lasix a couple of times a month generally.   ECG (personally reviewed): NSR, 1st degree AVB, lateral TWIs  Labs (2/19): free T4 increased but free T3 normal, LDL 86, HDL 30 Labs (3/19): K 4.9, creatinine 1.53, hgb 14.3, LDL 52, ANA negative, RF negative Labs (4/19): K 4.2, creatinine 1.27 Labs (5/19): LDL 69, K 4.4, creatinine 1.36  PMH:  1. Atrial fibrillation: Noted post-op CABG.  2. Atrial flutter: Noted during 2/19 admission, DCCV done.  S/p Ablation 3/19.  3. CAD: s/p CABG.  - LHC (1/16):  Patent  LIMA-LAD and SVG-D. 80% mLCx and 99% PLOM, total occlusion of SVG-PLOM.  Total occlusion of RCA with occlusion of SVG-RCA.  Patient had DES to mLCx-PLOM.  4. HTN 5. Type II diabetes  6. Hyperlipidemia 7. Chronic systolic CHF: Ischemic cardiomyopathy.   - Echo (2/19): EF 15%, mildly dilated LV, mild LVH, inferolateral akinesis, milr MR, mildly dilated RV with severely reduced systolic function.  - CPX (2/19): Peak VO2 12.2, VE/VCO2 slope 53, RER 1.07 => severe limitation from heart failure.  - [place] ICD  - RHC (4/19): mean RA 5, PA 54/18 mean 31, mean PCWP 12, CI 2.5, PVR 3.6 WU 8. Interstitial lung disease: PFTs (2/19) were restrictive, raising concern for ILD.  - High resolution CT chest in 2/19: interstitial lung disease concerning for usual interstitial pneumonitis. - Bronchoscopy was done, possible amiodarone lung toxicity causing ILD, now on prednisone.  9. CKD stage 3 10. OSA 11. ABIs (2/19): not significantly abnormal 12. Carotid dopplers (2/19): Mild disease only.  13. Pulmonary hypertension: ?Group 3 due to ILD.   ROS: All systems negative except as listed in HPI, PMH and Problem List.  Social History   Socioeconomic History  . Marital status: Divorced    Spouse name: n/a  . Number of children: 0  . Years of education: 12th grade  . Highest education level: Not on file  Occupational History  . Occupation: Data processing manager ---self  employed    Employer: [employer]  Social Needs  . Financial resource strain: Not on file  . Food insecurity:    Worry: Not on file    Inability: Not on file  . Transportation needs:    Medical: Not on file    Non-medical: Not on file  Tobacco Use  . Smoking status: Never Smoker  . Smokeless tobacco: Current User    Types: Chew  Substance and Sexual Activity  . Alcohol use: Yes    Alcohol/week: 1.8 oz    Types: 3 Glasses of wine per week  . Drug use: No  . Sexual activity: Not Currently     Partners: Female  Lifestyle  . Physical activity:    Days per week: Not on file    Minutes per session: Not on file  . Stress: Not on file  Relationships  . Social connections:    Talks on phone: Not on file    Gets together: Not on file    Attends religious service: Not on file    Active member of club or organization: Not on file    Attends meetings of clubs or organizations: Not on file    Relationship status: Not on file  . Intimate partner violence:    Fear of current or ex partner: Not on file    Emotionally abused: Not on file    Physically abused: Not on file    Forced sexual activity: Not on file  Other Topics Concern  . Not on file  Social History Narrative   Exercise-- walking    Lives alone.   Brother lives in [place], [place]    Family History  Problem Relation Age of Onset  . Hypertension Mother   . Diabetes Mother   . Hypertension Father   . Diabetes Father   . Hyperlipidemia Father     Current Outpatient Medications  Medication Sig Dispense Refill  . ACCU-CHEK FASTCLIX LANCETS MISC Use as directed once a day.  Dx code: E11.9 100 each 1  . albuterol (PROAIR HFA) 108 (90 Base) MCG/ACT inhaler Inhale 1-2 puffs into the lungs every 6 (six) hours as needed for wheezing or shortness of breath. 1 Inhaler 3  . apixaban (ELIQUIS) 5 MG TABS tablet Take 1 tablet (5 mg total) by mouth 2 (two) times daily. 180 tablet 3  . atorvastatin (LIPITOR) 40 MG tablet Take 1 tablet (40 mg total) by mouth at bedtime. 90 tablet 3  . blood glucose meter kit and supplies Dispense based on patient and insurance preference. Use as directed once a day. Dx Code E11.9. 1 each 0  . Blood Glucose Monitoring Suppl (ACCU-CHEK GUIDE) w/Device KIT 1 each by Does not apply route daily. DX Code: E11.9 1 kit 0  . carvedilol (COREG) 12.5 MG tablet Take 1.5 tablets (18.75 mg total) by mouth 2 (two) times daily with a meal. 75 tablet 3  . digoxin (LANOXIN) 0.125 MG tablet Take 0.0625 mg by mouth daily.     . furosemide (LASIX) 40 MG tablet Take 1 tablet (40 mg total) by mouth daily as needed. 30 tablet 11  . glucose blood (ACCU-CHEK GUIDE) test strip Use as directed once a day.  Dx code: E11.9 100 each 1  . guaiFENesin (MUCINEX) 600 MG 12 hr tablet Take 1 tablet (600 mg total) by mouth 2 (two) times daily as needed for to loosen phlegm.    . metFORMIN (GLUCOPHAGE) 500 MG tablet Take 1 tablet (500 mg total)  by mouth 2 (two) times daily with a meal. 180 tablet 0  . mometasone-formoterol (DULERA) 200-5 MCG/ACT AERO Inhale 2 puffs into the lungs 2 (two) times daily. Further refills per PCP 1 Inhaler 0  . predniSONE (DELTASONE) 10 MG tablet Take 48mx2weeks,20mgx2weeks,then 164maily 180 tablet 1  . sacubitril-valsartan (ENTRESTO) 49-51 MG Take 1 tablet by mouth 2 (two) times daily. 30 tablet 0  . spironolactone (ALDACTONE) 25 MG tablet Take 1 tablet (25 mg total) by mouth daily. 30 tablet 3  . nitroGLYCERIN (NITROSTAT) 0.4 MG SL tablet Place 1 tablet (0.4 mg total) under the tongue every 5 (five) minutes as needed for chest pain. (Patient not taking: Reported on 11/21/2017) 25 tablet 3   No current facility-administered medications for this encounter.     Vitals:   01/07/18 1108  BP: 130/72  Pulse: 80  SpO2: 97%  Weight: 191 lb 12.8 oz (87 kg)    PHYSICAL EXAM: General: NAD Neck: No JVD, no thyromegaly or thyroid nodule.  Lungs: Clear to auscultation bilaterally with normal respiratory effort. CV: Nondisplaced PMI.  Heart regular S1/S2, no S3/S4, no murmur.  No peripheral edema.  No carotid bruit.  Normal pedal pulses.  Abdomen: Soft, nontender, no hepatosplenomegaly, no distention.  Skin: Intact without lesions or rashes.  Neurologic: Alert and oriented x 3.  Psych: Normal affect. Extremities: No clubbing or cyanosis.  HEENT: Normal.   ASSESSMENT & PLAN: 1. Chronic systolic CHF: Ischemic cardiomyopathy. [employer] ICD single chamber. 2/19 admission for decompensated HF requiring  milrinone. ECHO 2/7/019 EF 15% Mildly dilated RV. CPX in 2/19 was suggestive of severe limitation due to HF.  Currently NYHA class II symptoms, seems to be doing better than would be predicted from CPX and 2/19 admission.  He is not volume overloaded on exam and is taking Lasix only prn.  RHC in 4/19 showed normal filling pressures and preserved cardiac output.  - Increase Coreg to 18.75 mg bid.   - Continue digoxin 0.125, check level today.  - Continue Entresto 49/51 bid, BMET today.  - Continue spironolactone 25 mg daily.   - Has IVCD, not LBBB-like.  Saw Dr. KlCaryl Comeswill not upgrade ICD to CRT.  - We have discussed LVAD with him.  However, with good cardiac output on last RHC and NYHA class II symptoms (improved), would hold off for now.  2. Atrial flutter: Paroxysmal.  Admitted in 2/19 with severe HF decompensation in setting of atrial flutter with RVR.  He was cardioverted.  He then had atrial flutter ablation in 3/19.  He is in NSR today.  - He is now off amiodarone (question of lung toxicity, would not re-challenge in future).   - Continue Eliquis 5 mg bid. CBC today.  3. Atrial fibrillation: Noted post-op CABG.  4. CAD: S/p CABG.  Last intervention was PCI to LCx in 2016. No chest pain.  - Continue atorvastatin, good lipids in 3/19.  - No ASA with Eliquis use.  5. Pulmonary fibrosis: PFTs in 2/19 were restrictive, and high resolution CT showed interstitial lung disease. Amiodarone was stopped due to concern for possible toxicity.  He has seen Dr. RaChase Caller> bronchoscopy suggestive of ILD related to amiodarone.  He is now on prednisone taper, this has helped his cough.    6. OSA: Uses CPAP.  7. CKD: Stage 3. BMET today.  8. Pulmonary hypertension: Suspect group 3 PH due to lung disease (ILD).    Followup in 1 month in HF pharmacy clinic to increase Entresto.  Loralie Champagne  01/07/2018

## 2018-01-11 ENCOUNTER — Telehealth (HOSPITAL_COMMUNITY): Payer: Self-pay

## 2018-01-11 NOTE — Telephone Encounter (Signed)
Patients insurance is active and benefits verified through [place] - $45.00 co-pay, no deductible, out of pocket amount of $4,200/$717.85 has been met, no co-insurance, and no pre-authorization is required. Passport/reference [phone]  Will contact patient to see if he is interested in the Cardiac Rehab Program. If interested, patient will be contacted for scheduling upon review by the RN Navigator.

## 2018-01-11 NOTE — Telephone Encounter (Signed)
Attempted to contact patient to see if he is interested in the Cardiac rehab Program - vm is not set up.

## 2018-01-16 ENCOUNTER — Telehealth (HOSPITAL_COMMUNITY): Payer: Self-pay

## 2018-01-16 NOTE — Telephone Encounter (Signed)
Called patient to see if he is interested in the Cardiac Rehab program - Patient stated he is not interested at this time as he has a lot going on. Closed referral.

## 2018-01-23 ENCOUNTER — Telehealth (HOSPITAL_COMMUNITY): Payer: Self-pay | Admitting: Surgery

## 2018-01-23 NOTE — Telephone Encounter (Signed)
Patient has successfully completed HF [employer].  He has been successfully independently managing his HF.   He will continue to follow with HF Clinic and is aware when to contact with any worsening symptoms.

## 2018-02-06 MED FILL — CARVEDILOL 12.5 MG TABLET: 12.5 | 25 days supply | Qty: 75 | Fill #1

## 2018-02-06 MED FILL — predniSONE 10 MG TABS: 10 | 28 days supply | Qty: 84 | Fill #1

## 2018-02-06 MED FILL — SPIRONOLACTONE 25 MG TABLET: 25 | 30 days supply | Qty: 30 | Fill #1

## 2018-02-11 ENCOUNTER — Ambulatory Visit (HOSPITAL_COMMUNITY)
Admission: RE | Admit: 2018-02-11 | Discharge: 2018-02-11 | Disposition: A | Discharge status: Home / Self Care | Payer: Medicare HMO | Source: Ambulatory Visit | Attending: Internal Medicine | Admitting: Internal Medicine

## 2018-02-11 ENCOUNTER — Other Ambulatory Visit (HOSPITAL_COMMUNITY): Payer: Self-pay | Admitting: Pharmacist

## 2018-02-11 VITALS — BP 124/68 | HR 72 | Wt 193.4 lb

## 2018-02-11 DIAGNOSIS — J8410 Pulmonary fibrosis, unspecified: Secondary | ICD-10-CM | POA: Diagnosis not present

## 2018-02-11 DIAGNOSIS — G4733 Obstructive sleep apnea (adult) (pediatric): Secondary | ICD-10-CM | POA: Diagnosis not present

## 2018-02-11 DIAGNOSIS — Z951 Presence of aortocoronary bypass graft: Secondary | ICD-10-CM | POA: Insufficient documentation

## 2018-02-11 DIAGNOSIS — N183 Chronic kidney disease, stage 3 (moderate): Secondary | ICD-10-CM | POA: Diagnosis not present

## 2018-02-11 DIAGNOSIS — I5022 Chronic systolic (congestive) heart failure: Secondary | ICD-10-CM | POA: Insufficient documentation

## 2018-02-11 DIAGNOSIS — I2510 Atherosclerotic heart disease of native coronary artery without angina pectoris: Secondary | ICD-10-CM | POA: Insufficient documentation

## 2018-02-11 DIAGNOSIS — I4892 Unspecified atrial flutter: Secondary | ICD-10-CM | POA: Insufficient documentation

## 2018-02-11 DIAGNOSIS — E119 Type 2 diabetes mellitus without complications: Secondary | ICD-10-CM | POA: Diagnosis not present

## 2018-02-11 DIAGNOSIS — I4891 Unspecified atrial fibrillation: Secondary | ICD-10-CM | POA: Diagnosis not present

## 2018-02-11 MED ORDER — SACUBITRIL-VALSARTAN 97-103 MG PO TABS: 1.0000 | ORAL_TABLET | Freq: Two times a day (BID) | ORAL | 3 refills | Status: DC

## 2018-02-11 MED ORDER — DIGOXIN 125 MCG PO TABS: 0.0625 mg | ORAL_TABLET | Freq: Every day | ORAL | 5 refills | Status: DC

## 2018-02-11 NOTE — Patient Instructions (Addendum)
It was great to see you!  Please INCREASE Entresto to 97-103 mg TWICE DAILY. You may take #2 tablets TWICE DAILY of the Entresto 49-51 mg until you receive the higher dosage from [employer].   You are scheduled for blood work on Monday 8/26 at 10:30 am.   Please keep your appointment with Dr. [name] on Tuesday, 9/10.

## 2018-02-11 NOTE — Progress Notes (Signed)
HF MD: [facility]  HPI:  Vincent Chandler is a 57 year old with history of A flutter, chronic systolic heart failure, CAD s/p CABG, CKD Stage III, DMII, OSA.   Admitted 08/07/17 with atrial flutter/RVR and acute on chronic systolic heart failure. Underwent successful DC/CV on 2/8. Required short term milrinone but was able to wean off. HF meds started. Echo this admission with EF 15%. D/C weight 201 pounds.  CPX in 2/19 showed severe functional impairment due to heart failure. However, PFTs were restrictive and high resolution CT chest was concerning for interstitial lung disease.   He has had atrial flutter ablation.   He had RHC in 4/19 showing preserved cardiac output and low filling pressures, but moderate pulmonary hypertension.   He saw pulmonary regarding interstitial lung disease and had bronchoscopy.  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  He has been started on prednisone, cough is much improved on prednisone.    He saw Dr. Caryl Comes, not planning to upgrade ICD to CRT given IVCD but not LBBB-like.   He returns today for pharmacist-led HF medication titration. At last HF clinic visit on 01/07/18, his carvedilol was increased to 18.75 mg BID. As above, cough resolved for the most part on prednisone.  He is able to walk as far as he wants on flat ground without dyspnea. Can walk up a flight of stairs.  He takes Lasix a couple of times a month generally.   . Shortness of breath/dyspnea on exertion? no  . Orthopnea/PND? no . Edema? no . Lightheadedness/dizziness? no . Daily weights at home? Yes - stable 188-193 lb . Blood pressure/heart rate monitoring at home? no . Following low-sodium/fluid-restricted diet? yes  HF Medications: Carvedilol 18.75 mg PO BID Digoxin 0.0625 mg PO daily Furosemide 40 mg PO daily PRN weight gain/SOB - once in the last 2 weeks d/t "feeling bloated" Entresto 49-51 mg PO BID Spironolactone 25 mg PO daily   Has the patient been experiencing  any side effects to the medications prescribed?  no  Does the patient have any problems obtaining medications due to transportation or finances?   No - no Rx insurance but currently enrolled in [employer] PAF for Eliquis and [employer] PAF for [employer]   Understanding of regimen: good Understanding of indications: good Potential of compliance: good Patient understands to avoid NSAIDs. Patient understands to avoid decongestants.    Pertinent Lab Values: . 01/07/18: Serum creatinine 1.52 (BL 1.3-1.5), BUN 35, Potassium 4.7, Sodium 139, Digoxin 0.4   Vital Signs: . Weight: 193.4 lb (dry weight: 188-191 lb) . Blood pressure: 124/68 mmHg . Heart rate: 72 bpm  Assessment: 1. Chronicsystolic CHF (EF 19%), due to ICM. NYHA class IIsymptoms.  - Volume status slightly elevated based on weight  - Increase Entresto to goal 97-103 mg BID  - Continue carvedilol 18.75 mg BID, digoxin 0.0625 mg daily, furosemide 40 mg daily PRN for weight gain/SOB, and spironolactone 25 mg daily  - Basic disease state pathophysiology, medication indication, mechanism and side effects reviewed at length with patient and he verbalized understanding  2. Atrial flutter: Paroxysmal.  Admitted in 2/19 with severe HF decompensation in setting of atrial flutter with RVR.  He was cardioverted.  He then had atrial flutter ablation in 3/19. - He is now off amiodarone (question of lung toxicity, would not re-challenge in future).   - Continue Eliquis 5 mg bid  - Last CBC in 12/2017 WNL 3. Atrial fibrillation: Noted post-op CABG.  4. CAD:  S/p CABG.  Last intervention was PCI to LCx in 2016. No chest pain.  - Continue atorvastatin, good lipids in 3/19 - No ASA with Eliquis use 5. Pulmonary fibrosis: PFTs in 2/19 were restrictive, and high resolution CT showed interstitial lung disease. Amiodarone was stopped due to concern for possible toxicity.  He has seen [name] => bronchoscopy suggestive of ILD related to amiodarone.  He is now  on prednisone taper, this has helped his cough.    6. OSA:   - Uses CPAP  7. CKD: Stage 3.   - Last BMET stable (SCr 1.3-1.5) 8. Pulmonary hypertension:   - Suspect group 3 PH due to lung disease (ILD).    Plan: 1) Medication changes: Based on clinical presentation, vital signs and recent labs will increase Entresto to goal 97-103 mg BID 2) Labs: BMET in 2 weeks 3) Follow-up: Dr. [name] on 03/12/18   Ruta Hinds. Velva Harman, PharmD, BCPS, CPP Clinical Pharmacist Pager: [phone] Phone: [phone] 02/11/2018 10:44 AM

## 2018-02-14 ENCOUNTER — Emergency Department (HOSPITAL_BASED_OUTPATIENT_CLINIC_OR_DEPARTMENT_OTHER)
Admission: EM | Admit: 2018-02-14 | Discharge: 2018-02-14 | Disposition: A | Discharge status: Home / Self Care | Payer: Medicare HMO | Attending: Emergency Medicine | Admitting: Emergency Medicine

## 2018-02-14 ENCOUNTER — Other Ambulatory Visit: Payer: Self-pay

## 2018-02-14 ENCOUNTER — Encounter (HOSPITAL_BASED_OUTPATIENT_CLINIC_OR_DEPARTMENT_OTHER): Payer: Self-pay | Admitting: Emergency Medicine

## 2018-02-14 ENCOUNTER — Emergency Department (HOSPITAL_BASED_OUTPATIENT_CLINIC_OR_DEPARTMENT_OTHER): Payer: Medicare HMO

## 2018-02-14 DIAGNOSIS — N179 Acute kidney failure, unspecified: Secondary | ICD-10-CM | POA: Insufficient documentation

## 2018-02-14 DIAGNOSIS — F17220 Nicotine dependence, chewing tobacco, uncomplicated: Secondary | ICD-10-CM | POA: Insufficient documentation

## 2018-02-14 DIAGNOSIS — R1030 Lower abdominal pain, unspecified: Secondary | ICD-10-CM | POA: Diagnosis present

## 2018-02-14 DIAGNOSIS — R143 Flatulence: Secondary | ICD-10-CM | POA: Diagnosis not present

## 2018-02-14 DIAGNOSIS — I2510 Atherosclerotic heart disease of native coronary artery without angina pectoris: Secondary | ICD-10-CM | POA: Insufficient documentation

## 2018-02-14 DIAGNOSIS — E119 Type 2 diabetes mellitus without complications: Secondary | ICD-10-CM | POA: Diagnosis not present

## 2018-02-14 DIAGNOSIS — N200 Calculus of kidney: Secondary | ICD-10-CM | POA: Insufficient documentation

## 2018-02-14 DIAGNOSIS — I509 Heart failure, unspecified: Secondary | ICD-10-CM | POA: Diagnosis not present

## 2018-02-14 DIAGNOSIS — Z951 Presence of aortocoronary bypass graft: Secondary | ICD-10-CM | POA: Insufficient documentation

## 2018-02-14 DIAGNOSIS — R69 Illness, unspecified: Secondary | ICD-10-CM | POA: Diagnosis not present

## 2018-02-14 DIAGNOSIS — R1033 Periumbilical pain: Secondary | ICD-10-CM | POA: Diagnosis not present

## 2018-02-14 DIAGNOSIS — I110 Hypertensive heart disease with heart failure: Secondary | ICD-10-CM | POA: Insufficient documentation

## 2018-02-14 DIAGNOSIS — J45909 Unspecified asthma, uncomplicated: Secondary | ICD-10-CM | POA: Diagnosis not present

## 2018-02-14 LAB — URINALYSIS, MICROSCOPIC (REFLEX)

## 2018-02-14 LAB — CBC
HCT: 39.2 % (ref 39.0–52.0)
Hemoglobin: 14.2 g/dL (ref 13.0–17.0)
MCH: 36.1 pg — ABNORMAL HIGH (ref 26.0–34.0)
MCHC: 36.2 g/dL — ABNORMAL HIGH (ref 30.0–36.0)
MCV: 99.7 fL (ref 78.0–100.0)
PLATELETS: 166 10*3/uL (ref 150–400)
RBC: 3.93 MIL/uL — AB (ref 4.22–5.81)
RDW: 14.7 % (ref 11.5–15.5)
WBC: 7.8 10*3/uL (ref 4.0–10.5)

## 2018-02-14 LAB — COMPREHENSIVE METABOLIC PANEL
ALK PHOS: 56 U/L (ref 38–126)
ALT: 17 U/L (ref 0–44)
AST: 13 U/L — ABNORMAL LOW (ref 15–41)
Albumin: 3.5 g/dL (ref 3.5–5.0)
Anion gap: 13 (ref 5–15)
BILIRUBIN TOTAL: 0.8 mg/dL (ref 0.3–1.2)
BUN: 45 mg/dL — ABNORMAL HIGH (ref 6–20)
CALCIUM: 8.7 mg/dL — AB (ref 8.9–10.3)
CO2: 23 mmol/L (ref 22–32)
CREATININE: 2.54 mg/dL — AB (ref 0.61–1.24)
Chloride: 100 mmol/L (ref 98–111)
GFR calc non Af Amer: 27 mL/min — ABNORMAL LOW (ref >60)
GFR, EST AFRICAN AMERICAN: 31 mL/min — AB (ref >60)
Glucose, Bld: 198 mg/dL — ABNORMAL HIGH (ref 70–99)
Potassium: 5.1 mmol/L (ref 3.5–5.1)
Sodium: 136 mmol/L (ref 135–145)
TOTAL PROTEIN: 7.1 g/dL (ref 6.5–8.1)

## 2018-02-14 LAB — URINALYSIS, ROUTINE W REFLEX MICROSCOPIC
BILIRUBIN URINE: NEGATIVE
Glucose, UA: 250 mg/dL — AB
Ketones, ur: NEGATIVE mg/dL
Leukocytes, UA: NEGATIVE
Nitrite: NEGATIVE
PROTEIN: NEGATIVE mg/dL
Specific Gravity, Urine: 1.02 (ref 1.005–1.030)
pH: 5.5 (ref 5.0–8.0)

## 2018-02-14 LAB — BASIC METABOLIC PANEL
ANION GAP: 9 (ref 5–15)
BUN: 45 mg/dL — AB (ref 6–20)
CHLORIDE: 101 mmol/L (ref 98–111)
CO2: 25 mmol/L (ref 22–32)
Calcium: 8.4 mg/dL — ABNORMAL LOW (ref 8.9–10.3)
Creatinine, Ser: 2.2 mg/dL — ABNORMAL HIGH (ref 0.61–1.24)
GFR calc Af Amer: 37 mL/min — ABNORMAL LOW (ref >60)
GFR calc non Af Amer: 32 mL/min — ABNORMAL LOW (ref >60)
GLUCOSE: 161 mg/dL — AB (ref 70–99)
POTASSIUM: 5.3 mmol/L — AB (ref 3.5–5.1)
Sodium: 135 mmol/L (ref 135–145)

## 2018-02-14 LAB — LIPASE, BLOOD: LIPASE: 22 U/L (ref 11–51)

## 2018-02-14 MED ORDER — ONDANSETRON HCL 4 MG/2ML IJ SOLN
4.0000 mg | Freq: Once | INTRAMUSCULAR | Status: AC
  Administered 2018-02-14: 4 mg via INTRAVENOUS
  Filled 2018-02-14: qty 2

## 2018-02-14 MED ORDER — HYDROMORPHONE HCL 1 MG/ML IJ SOLN
1.0000 mg | Freq: Once | INTRAMUSCULAR | Status: AC
  Administered 2018-02-14: 1 mg via INTRAVENOUS
  Filled 2018-02-14: qty 1

## 2018-02-14 MED ORDER — OXYCODONE-ACETAMINOPHEN 5-325 MG PO TABS: 1.0000 | ORAL_TABLET | ORAL | 0 refills | Status: DC | PRN

## 2018-02-14 MED ORDER — SODIUM CHLORIDE 0.9 % IV BOLUS
500.0000 mL | Freq: Once | INTRAVENOUS | Status: AC
  Administered 2018-02-14: 500 mL via INTRAVENOUS

## 2018-02-14 MED FILL — OXYCODONE-ACETAMINOPHEN 5-3: 5-325 | 2 days supply | Qty: 5 | Fill #0

## 2018-02-14 NOTE — ED Provider Notes (Signed)
[facility] Emergency Department Provider Note MRN:  [id number]  Arrival date & time: 02/14/18     Chief Complaint   Abdominal Pain   History of Present Illness   Vincent Chandler is a 57 y.o. year-old male with a history of diabetes, cardiomyopathy, CAD presenting to the ED with chief complaint of abdominal pain.  Pain is located in the lower abdomen, suprapubic region.  The pain began gradually last night, progressively worsening.  Described as a pressure-like pain.  No exacerbating or alleviating factors.  Associated with single episode nonbloody nonbilious emesis.  No urine production this morning.  Denies recent fevers or chills, no chest pain or shortness of breath, no cough, no upper abdominal pain, no recent dysuria, no bowel movement since last night, but passing gas this morning.  Review of Systems  A complete 10 system review of systems was obtained and all systems are negative except as noted in the HPI and PMH.   Patient's Health History    Past Medical History:  Diagnosis Date  . AICD (automatic cardioverter/defibrillator) present   . Anginal pain ([place])   . Anxiety   . Asthma   . Atrial fibrillation-postoperative 11/28/2012  . Cardiomyopathy    alcohol use related  . CHF (congestive heart failure) ([place])   . Chronic back pain   . Coronary artery disease    drug eluting stent RCA 2005-EF 30%- s/p CABG x 4; 2/4 patent grafts with SVG-PLOM and SVG-RCA system totally occluded. There are collaterals from the LAD system to the RCA and the RCA is totally occluded proximally. There are no collaterals to the LCx territory. He then underwent successful PCI of the mid left circumflex artery with overlapping Synergy drug-eluting stents  . Depression   . Diabetes mellitus type II dx'd in the 1990's  . GERD (gastroesophageal reflux disease)    yrs ago  . H/O hiatal hernia   . History of hypogonadism   . History of kidney stones   . Hyperlipidemia     . Hypertension   . OSA on CPAP    "mask is broken; working on getting a new one" (01/15/2015; 10/03/2017)  . Pneumonia    "3-4 times" (10/03/2017)  . Urinary incontinence     Past Surgical History:  Procedure Laterality Date  . A-FLUTTER ABLATION N/A 09/12/2017   Procedure: A-FLUTTER ABLATION;  Surgeon: Deboraha Sprang, MD;  Location: [place] [facility];  Service: Cardiovascular;  Laterality: N/A;  . CARDIAC DEFIBRILLATOR PLACEMENT  01/15/2015  . CARDIOVERSION N/A 08/10/2017   Procedure: CARDIOVERSION;  Surgeon: Larey Dresser, MD;  Location: [facility] ENDOSCOPY;  Service: Cardiovascular;  Laterality: N/A;  . CORONARY ANGIOPLASTY WITH STENT PLACEMENT  ~ 2003  . CORONARY ARTERY BYPASS GRAFT N/A 08/15/2012   Procedure: CORONARY ARTERY BYPASS GRAFTING (CABG);  Surgeon: Ivin Poot, MD;  Location: [place];  Service: Open Heart Surgery;  Laterality: N/A;  Coronary Artery Bypass Grafting Times Four Using Left Internal Mammary Artery and Right Saphenous Leg Vein Harvested Endoscopically  . EP IMPLANTABLE DEVICE N/A 01/15/2015   Procedure: ICD Implant;  Surgeon: Deboraha Sprang, MD;  Location: [place] [facility];  Service: Cardiovascular;  Laterality: N/A;  . LEFT HEART CATHETERIZATION WITH CORONARY ANGIOGRAM N/A 08/08/2012   Procedure: LEFT HEART CATHETERIZATION WITH CORONARY ANGIOGRAM;  Surgeon: Peter M Martinique, MD;  Location: [facility] CATH LAB;  Service: Cardiovascular;  Laterality: N/A;  . LEFT HEART CATHETERIZATION WITH CORONARY/GRAFT ANGIOGRAM N/A 07/21/2014   Procedure: LEFT  HEART CATHETERIZATION WITH Beatrix Fetters;  Surgeon: Larey Dresser, MD;  Location: [facility] CATH LAB;  Service: Cardiovascular;  Laterality: N/A;  . MULTIPLE EXTRACTIONS WITH ALVEOLOPLASTY N/A 10/04/2017   Procedure: Extraction of tooth #'s 5 and 14 with alveoloplasty and gross debridement of remaining teeth;  Surgeon: [name], DDS;  Location: [place];  Service: Oral Surgery;  Laterality: N/A;  . PERCUTANEOUS CORONARY STENT  INTERVENTION (PCI-S)  07/21/2014   Procedure: PERCUTANEOUS CORONARY STENT INTERVENTION (PCI-S);  Surgeon: Larey Dresser, MD;  Location: [facility] CATH LAB;  Service: Cardiovascular;;  . REFRACTIVE SURGERY Bilateral 1990's  . RIGHT HEART CATH N/A 10/03/2017   Procedure: RIGHT HEART CATH;  Surgeon: Larey Dresser, MD;  Location: [place] CV LAB;  Service: Cardiovascular;  Laterality: N/A;  . TEE WITHOUT CARDIOVERSION N/A 08/10/2017   Procedure: TRANSESOPHAGEAL ECHOCARDIOGRAM (TEE);  Surgeon: Larey Dresser, MD;  Location: [facility] ENDOSCOPY;  Service: Cardiovascular;  Laterality: N/A;  . TONSILLECTOMY  ~ 1970  . VIDEO BRONCHOSCOPY Bilateral 11/28/2017   Procedure: VIDEO BRONCHOSCOPY WITHOUT FLUORO;  Surgeon: Brand Males, MD;  Location: [facility] ENDOSCOPY;  Service: Endoscopy;  Laterality: Bilateral;    Family History  Problem Relation Age of Onset  . Hypertension Mother   . Diabetes Mother   . Hypertension Father   . Diabetes Father   . Hyperlipidemia Father     Social History   Socioeconomic History  . Marital status: Divorced    Spouse name: n/a  . Number of children: 0  . Years of education: 12th grade  . Highest education level: Not on file  Occupational History  . Occupation: Data processing manager ---self employed    Employer: [employer]  Social Needs  . Financial resource strain: Not on file  . Food insecurity:    Worry: Not on file    Inability: Not on file  . Transportation needs:    Medical: Not on file    Non-medical: Not on file  Tobacco Use  . Smoking status: Never Smoker  . Smokeless tobacco: Current User    Types: Chew  Substance and Sexual Activity  . Alcohol use: Yes    Alcohol/week: 3.0 standard drinks    Types: 3 Glasses of wine per week  . Drug use: No  . Sexual activity: Not Currently    Partners: Female  Lifestyle  . Physical activity:    Days per week: Not on file    Minutes per session: Not on file  . Stress: Not on file    Relationships  . Social connections:    Talks on phone: Not on file    Gets together: Not on file    Attends religious service: Not on file    Active member of club or organization: Not on file    Attends meetings of clubs or organizations: Not on file    Relationship status: Not on file  . Intimate partner violence:    Fear of current or ex partner: Not on file    Emotionally abused: Not on file    Physically abused: Not on file    Forced sexual activity: Not on file  Other Topics Concern  . Not on file  Social History Narrative   Exercise-- walking    Lives alone.   Brother lives in [place], [place]     Physical Exam  Vital Signs and Nursing Notes reviewed Vitals:   02/14/18 0931 02/14/18 1138  BP: (!) 177/97 125/79  Pulse: 97 75  Resp:  20 18  Temp: 97.7 F (36.5 C)   SpO2: 95% 94%    CONSTITUTIONAL: Well-appearing, in mild distress due to pain NEURO:  Alert and oriented x 3, no focal deficits EYES:  eyes equal and reactive ENT/NECK:  no LAD, no JVD CARDIO: Regular rate, well-perfused, normal S1 and S2 PULM:  CTAB no wheezing or rhonchi GI/GU:  normal bowel sounds, non-distended, tender to palpation to right and left lower quadrants MSK/SPINE:  No gross deformities, no edema SKIN:  no rash, atraumatic PSYCH:  Appropriate speech and behavior  Diagnostic and Interventional Summary    EKG Interpretation  Date/Time:    Ventricular Rate:    PR Interval:    QRS Duration:   QT Interval:    QTC Calculation:   R Axis:     Text Interpretation:        Labs Reviewed  CBC - Abnormal; Notable for the following components:      Result Value   RBC 3.93 (*)    MCH 36.1 (*)    MCHC 36.2 (*)    All other components within normal limits  COMPREHENSIVE METABOLIC PANEL - Abnormal; Notable for the following components:   Glucose, Bld 198 (*)    BUN 45 (*)    Creatinine, Ser 2.54 (*)    Calcium 8.7 (*)    AST 13 (*)    GFR calc non Af Amer 27 (*)    GFR calc Af Amer  31 (*)    All other components within normal limits  URINALYSIS, ROUTINE W REFLEX MICROSCOPIC - Abnormal; Notable for the following components:   APPearance HAZY (*)    Glucose, UA 250 (*)    Hgb urine dipstick LARGE (*)    All other components within normal limits  URINALYSIS, MICROSCOPIC (REFLEX) - Abnormal; Notable for the following components:   Bacteria, UA RARE (*)    All other components within normal limits  BASIC METABOLIC PANEL - Abnormal; Notable for the following components:   Potassium 5.3 (*)    Glucose, Bld 161 (*)    BUN 45 (*)    Creatinine, Ser 2.20 (*)    Calcium 8.4 (*)    GFR calc non Af Amer 32 (*)    GFR calc Af Amer 37 (*)    All other components within normal limits  LIPASE, BLOOD    CT ABDOMEN PELVIS WO CONTRAST  Final Result      Medications  HYDROmorphone (DILAUDID) injection 1 mg (1 mg Intravenous Given 02/14/18 1020)  sodium chloride 0.9 % bolus 500 mL (0 mLs Intravenous Stopped 02/14/18 1135)  ondansetron (ZOFRAN) injection 4 mg (4 mg Intravenous Given 02/14/18 1019)     Procedures Critical Care  ED Course and Medical Decision Making  I have reviewed the triage vital signs and the nursing notes.  Pertinent labs & imaging results that were available during my care of the patient were reviewed by me and considered in my medical decision making (see below for details). Clinical Course as of Feb 15 1327  Thu Feb 15, 6372  [id number] 57 year old male history of cardiomyopathy, diabetes presenting with lower abdominal pain.  Initially considering urinary retention versus appendicitis versus fecal impaction versus diverticulitis.  Bedside ultrasound reveals empty bladder, normal caliber aorta.  Will evaluate with CT, labs pending.   [MB]  1209 CT reveals kidney stone that has already passed.  Labs revealed mild to moderate AKI.  Patient's pain is resolved, well-appearing.  Has been hydrated here in the  ED, will repeat BMP to ensure improvement of kidney  function.  Patient explains he has easy ability to obtain close follow-up with PCP in the next 1 to 2 days for repeat kidney function testing.   [MB]    Clinical Course User Index [MB] Maudie Flakes, MD    Creatinine improving from 2.5-2.2 after only 500 cc IV fluids.  Patient continues to be pain-free.  Noted stranding around the kidney on CT scan but no evidence of infection on the UA today.  All explained by past kidney stone today.  Expecting resolution of AKI with continued fluids at home.  Patient advised to follow-up with his primary care provider in 2 days for repeat kidney function testing to ensure resolution.  Advised to return to the emergency department with any return of pain, or fever, or dysuria.  After the discussed management above, the patient was determined to be safe for discharge.  The patient was in agreement with this plan and all questions regarding their care were answered.  ED return precautions were discussed and the patient will return to the ED with any significant worsening of condition.  Barth Kirks. Sedonia Small, [place] [email or web address]  Final Clinical Impressions(s) / ED Diagnoses     ICD-10-CM   1. Kidney stone N20.0   2. AKI (acute kidney injury) ([place]) N17.9     ED Discharge Orders         Ordered    oxyCODONE-acetaminophen (PERCOCET/ROXICET) 5-325 MG tablet  Every 4 hours PRN     02/14/18 1325             Maudie Flakes, MD 02/14/18 1328

## 2018-02-14 NOTE — ED Notes (Signed)
Pt/family verbalized understanding of discharge instructions.

## 2018-02-14 NOTE — ED Triage Notes (Signed)
Pt c/o lower middle abd pain that kept him up all night; some difficulty urinating; vomited x 1

## 2018-02-14 NOTE — Discharge Instructions (Addendum)
You were evaluated in the Emergency Department and after careful evaluation, we did not find any emergent condition requiring admission or further testing in the hospital.  Your symptoms today seem to be due to a kidney stone.  The CT performed today shows that you have already passed the stone.  There was some inflammation around your kidney, but there is no evidence of infection in your urine.  Your kidney function was decreased today due to the kidney stone.  Your kidney function was improving with fluids, but still not normal.  Please follow-up with your primary care provider in 2 days for repeat testing of your kidney function to ensure that it is returning to normal.  Please return to the Emergency Department if you experience return of your pain, fever, or any pain with urination.  We encourage you to follow up with a primary care provider.  Thank you for allowing [place] to be a part of your care.

## 2018-02-15 ENCOUNTER — Telehealth: Payer: Self-pay

## 2018-02-15 NOTE — Telephone Encounter (Signed)
Hospital follow up call made to patient. Unable to leave message for return call because voicemail not setup.

## 2018-02-21 ENCOUNTER — Ambulatory Visit: Payer: Self-pay | Admitting: Internal Medicine

## 2018-02-25 ENCOUNTER — Other Ambulatory Visit (HOSPITAL_COMMUNITY): Payer: Self-pay

## 2018-03-07 MED FILL — DIGOXIN 0.125 MG TABLET: 125 | 30 days supply | Qty: 15 | Fill #0

## 2018-03-07 MED FILL — CARVEDILOL 12.5 MG TABLET: 12.5 | 25 days supply | Qty: 75 | Fill #2

## 2018-03-07 MED FILL — metFORMIN HCL 500 MG TABS: 500 | 30 days supply | Qty: 60 | Fill #0

## 2018-03-07 MED FILL — SPIRONOLACTONE 25 MG TABLET: 25 | 30 days supply | Qty: 30 | Fill #2

## 2018-03-12 ENCOUNTER — Ambulatory Visit (HOSPITAL_COMMUNITY)
Admission: RE | Admit: 2018-03-12 | Discharge: 2018-03-12 | Disposition: A | Discharge status: Home / Self Care | Payer: Medicare HMO | Source: Ambulatory Visit | Attending: Cardiology | Admitting: Cardiology

## 2018-03-12 VITALS — BP 102/58 | HR 90 | Wt 191.0 lb

## 2018-03-12 DIAGNOSIS — I5023 Acute on chronic systolic (congestive) heart failure: Secondary | ICD-10-CM | POA: Insufficient documentation

## 2018-03-12 DIAGNOSIS — N200 Calculus of kidney: Secondary | ICD-10-CM | POA: Insufficient documentation

## 2018-03-12 DIAGNOSIS — I2723 Pulmonary hypertension due to lung diseases and hypoxia: Secondary | ICD-10-CM | POA: Insufficient documentation

## 2018-03-12 DIAGNOSIS — G4733 Obstructive sleep apnea (adult) (pediatric): Secondary | ICD-10-CM | POA: Insufficient documentation

## 2018-03-12 DIAGNOSIS — J8410 Pulmonary fibrosis, unspecified: Secondary | ICD-10-CM | POA: Insufficient documentation

## 2018-03-12 DIAGNOSIS — Z833 Family history of diabetes mellitus: Secondary | ICD-10-CM | POA: Insufficient documentation

## 2018-03-12 DIAGNOSIS — E785 Hyperlipidemia, unspecified: Secondary | ICD-10-CM | POA: Diagnosis not present

## 2018-03-12 DIAGNOSIS — I2510 Atherosclerotic heart disease of native coronary artery without angina pectoris: Secondary | ICD-10-CM | POA: Insufficient documentation

## 2018-03-12 DIAGNOSIS — Z79899 Other long term (current) drug therapy: Secondary | ICD-10-CM | POA: Diagnosis not present

## 2018-03-12 DIAGNOSIS — I255 Ischemic cardiomyopathy: Secondary | ICD-10-CM | POA: Diagnosis not present

## 2018-03-12 DIAGNOSIS — I130 Hypertensive heart and chronic kidney disease with heart failure and stage 1 through stage 4 chronic kidney disease, or unspecified chronic kidney disease: Secondary | ICD-10-CM | POA: Insufficient documentation

## 2018-03-12 DIAGNOSIS — Z79891 Long term (current) use of opiate analgesic: Secondary | ICD-10-CM | POA: Insufficient documentation

## 2018-03-12 DIAGNOSIS — I4892 Unspecified atrial flutter: Secondary | ICD-10-CM | POA: Diagnosis not present

## 2018-03-12 DIAGNOSIS — N183 Chronic kidney disease, stage 3 (moderate): Secondary | ICD-10-CM | POA: Insufficient documentation

## 2018-03-12 DIAGNOSIS — Z09 Encounter for follow-up examination after completed treatment for conditions other than malignant neoplasm: Secondary | ICD-10-CM | POA: Diagnosis not present

## 2018-03-12 DIAGNOSIS — E1122 Type 2 diabetes mellitus with diabetic chronic kidney disease: Secondary | ICD-10-CM | POA: Diagnosis not present

## 2018-03-12 DIAGNOSIS — I5022 Chronic systolic (congestive) heart failure: Secondary | ICD-10-CM | POA: Diagnosis not present

## 2018-03-12 DIAGNOSIS — Z7984 Long term (current) use of oral hypoglycemic drugs: Secondary | ICD-10-CM | POA: Diagnosis not present

## 2018-03-12 DIAGNOSIS — I4891 Unspecified atrial fibrillation: Secondary | ICD-10-CM | POA: Insufficient documentation

## 2018-03-12 DIAGNOSIS — Z951 Presence of aortocoronary bypass graft: Secondary | ICD-10-CM | POA: Insufficient documentation

## 2018-03-12 DIAGNOSIS — Z8249 Family history of ischemic heart disease and other diseases of the circulatory system: Secondary | ICD-10-CM | POA: Insufficient documentation

## 2018-03-12 DIAGNOSIS — Z7901 Long term (current) use of anticoagulants: Secondary | ICD-10-CM | POA: Diagnosis not present

## 2018-03-12 LAB — DIGOXIN LEVEL: Digoxin Level: 0.9 ng/mL (ref 0.8–2.0)

## 2018-03-12 MED ORDER — CARVEDILOL 25 MG PO TABS: 25.0000 mg | ORAL_TABLET | Freq: Two times a day (BID) | ORAL | 3 refills | Status: DC

## 2018-03-12 NOTE — Progress Notes (Signed)
PCP: Dr. Etter Sjogren Primary HF Cardiologist: Dr [name]  EP: Dr Caryl Comes   HPI: Vincent Chandler is a 57 year old with history of A flutter, chronic systolic heart failure, CAD s/p CABG, CKD Stage III, DMII, OSA.   Admitted 08/07/17 with atrial flutter/RVR and acute on chronic systolic heart failure. Underwent successful DC/CV on 2/8. Required short term milrinone but was able to wean off. HF meds started. Echo this admission with EF 15%. D/C weight 201 pounds.  CPX in 2/19 showed severe functional impairment due to heart failure. However, PFTs were restrictive and high resolution CT chest was concerning for interstitial lung disease.   He has had atrial flutter ablation.   He had RHC in 4/19 showing preserved cardiac output and low filling pressures, but moderate pulmonary hypertension.   He saw pulmonary regarding interstitial lung disease and had bronchoscopy.  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  He has been started on prednisone, cough is much improved on prednisone.    He saw Dr. Caryl Comes, not planning to upgrade ICD to CRT given IVCD but not LBBB-like.   He returns today for HF follow up. Last saw pharmacist and Entresto increased to goal dose. BMET a few days later showed creat 2.2, K 5.3 in setting of a kidney stone. Overall doing fine. Able to walk pretty far on flat ground without SOB. Playing golf. No problems with stairs or hills. Denies edema, orthopnea, or PND. He has a productive cough with clear sputum. No fever or chills. No CP. Has some mild dizziness with standing every other day. Has taken lasix 2-3 times in the last month for bloating or when he is going to eat a salty meal. Rarely misses PM dose of medications. Limits fluid and salt. No tobacco use. Occasional ETOH.   He returns today for followup of CHF.  Weight is down 7 lbs.  As above, cough resolved for the most part on prednisone.  No ETOH or smoking.  He is able to walk as far as he wants on flat ground without  dyspnea.  Able to golf.  Can walk up a flight of stairs.  No chest pain, no palpitations.  No orthopnea/PND.  He takes Lasix a couple of times a month generally.   Medtronic device interrogation: thoracic impedence below threshold and trending down. Active ~2 hours/day. No AF. No VT/VF.  Labs (2/19): free T4 increased but free T3 normal, LDL 86, HDL 30 Labs (3/19): K 4.9, creatinine 1.53, hgb 14.3, LDL 52, ANA negative, RF negative Labs (4/19): K 4.2, creatinine 1.27 Labs (5/19): LDL 69, K 4.4, creatinine 1.36 Labs (8/19): K 5.3, creatinine 2.2  PMH:  1. Atrial fibrillation: Noted post-op CABG.  2. Atrial flutter: Noted during 2/19 admission, DCCV done.  S/p Ablation 3/19.  3. CAD: s/p CABG.  - LHC (1/16):  Patent LIMA-LAD and SVG-D. 80% mLCx and 99% PLOM, total occlusion of SVG-PLOM.  Total occlusion of RCA with occlusion of SVG-RCA.  Patient had DES to mLCx-PLOM.  4. HTN 5. Type II diabetes  6. Hyperlipidemia 7. Chronic systolic CHF: Ischemic cardiomyopathy.   - Echo (2/19): EF 15%, mildly dilated LV, mild LVH, inferolateral akinesis, milr Vincent, mildly dilated RV with severely reduced systolic function.  - CPX (2/19): Peak VO2 12.2, VE/VCO2 slope 53, RER 1.07 => severe limitation from heart failure.  - [place] ICD  - RHC (4/19): mean RA 5, PA 54/18 mean 31, mean PCWP 12, CI 2.5, PVR 3.6 WU  8. Interstitial lung disease: PFTs (2/19) were restrictive, raising concern for ILD.  - High resolution CT chest in 2/19: interstitial lung disease concerning for usual interstitial pneumonitis. - Bronchoscopy was done, possible amiodarone lung toxicity causing ILD, now on prednisone.  9. CKD stage 3 10. OSA 11. ABIs (2/19): not significantly abnormal 12. Carotid dopplers (2/19): Mild disease only.  13. Pulmonary hypertension: ?Group 3 due to ILD.  14. Nephrolithiasis  Review of systems complete and found to be negative unless listed in HPI.   Social History   Socioeconomic History    . Marital status: Divorced    Spouse name: n/a  . Number of children: 0  . Years of education: 12th grade  . Highest education level: Not on file  Occupational History  . Occupation: Data processing manager ---self employed    Employer: [employer]  Social Needs  . Financial resource strain: Not on file  . Food insecurity:    Worry: Not on file    Inability: Not on file  . Transportation needs:    Medical: Not on file    Non-medical: Not on file  Tobacco Use  . Smoking status: Never Smoker  . Smokeless tobacco: Current User    Types: Chew  Substance and Sexual Activity  . Alcohol use: Yes    Alcohol/week: 3.0 standard drinks    Types: 3 Glasses of wine per week  . Drug use: No  . Sexual activity: Not Currently    Partners: Female  Lifestyle  . Physical activity:    Days per week: Not on file    Minutes per session: Not on file  . Stress: Not on file  Relationships  . Social connections:    Talks on phone: Not on file    Gets together: Not on file    Attends religious service: Not on file    Active member of club or organization: Not on file    Attends meetings of clubs or organizations: Not on file    Relationship status: Not on file  . Intimate partner violence:    Fear of current or ex partner: Not on file    Emotionally abused: Not on file    Physically abused: Not on file    Forced sexual activity: Not on file  Other Topics Concern  . Not on file  Social History Narrative   Exercise-- walking    Lives alone.   Brother lives in [place], [place]    Family History  Problem Relation Age of Onset  . Hypertension Mother   . Diabetes Mother   . Hypertension Father   . Diabetes Father   . Hyperlipidemia Father     Current Outpatient Medications  Medication Sig Dispense Refill  . apixaban (ELIQUIS) 5 MG TABS tablet Take 1 tablet (5 mg total) by mouth 2 (two) times daily. 180 tablet 3  . carvedilol (COREG) 12.5 MG tablet Take 1.5 tablets  (18.75 mg total) by mouth 2 (two) times daily with a meal. 75 tablet 3  . digoxin (LANOXIN) 0.125 MG tablet Take 0.5 tablets (0.0625 mg total) by mouth daily. 15 tablet 5  . furosemide (LASIX) 40 MG tablet Take 1 tablet (40 mg total) by mouth daily as needed. 30 tablet 11  . guaiFENesin (MUCINEX) 600 MG 12 hr tablet Take 1 tablet (600 mg total) by mouth 2 (two) times daily as needed for to loosen phlegm.    . sacubitril-valsartan (ENTRESTO) 97-103 MG Take 1 tablet by mouth 2 (two)  times daily. 180 tablet 3  . spironolactone (ALDACTONE) 25 MG tablet Take 1 tablet (25 mg total) by mouth daily. 30 tablet 3  . ACCU-CHEK FASTCLIX LANCETS MISC Use as directed once a day.  Dx code: E11.9 100 each 1  . albuterol (PROAIR HFA) 108 (90 Base) MCG/ACT inhaler Inhale 1-2 puffs into the lungs every 6 (six) hours as needed for wheezing or shortness of breath. 1 Inhaler 3  . atorvastatin (LIPITOR) 40 MG tablet Take 1 tablet (40 mg total) by mouth at bedtime. 90 tablet 3  . blood glucose meter kit and supplies Dispense based on patient and insurance preference. Use as directed once a day. Dx Code E11.9. 1 each 0  . Blood Glucose Monitoring Suppl (ACCU-CHEK GUIDE) w/Device KIT 1 each by Does not apply route daily. DX Code: E11.9 1 kit 0  . glucose blood (ACCU-CHEK GUIDE) test strip Use as directed once a day.  Dx code: E11.9 100 each 1  . metFORMIN (GLUCOPHAGE) 500 MG tablet Take 1 tablet (500 mg total) by mouth 2 (two) times daily with a meal. 180 tablet 0  . mometasone-formoterol (DULERA) 200-5 MCG/ACT AERO Inhale 2 puffs into the lungs 2 (two) times daily. Further refills per PCP 1 Inhaler 0  . nitroGLYCERIN (NITROSTAT) 0.4 MG SL tablet Place 1 tablet (0.4 mg total) under the tongue every 5 (five) minutes as needed for chest pain. (Patient not taking: Reported on 11/21/2017) 25 tablet 3  . oxyCODONE-acetaminophen (PERCOCET/ROXICET) 5-325 MG tablet Take 1 tablet by mouth every 4 (four) hours as needed for severe pain.  5 tablet 0  . predniSONE (DELTASONE) 10 MG tablet Take 32mx2weeks,20mgx2weeks,then 176maily 180 tablet 1   No current facility-administered medications for this encounter.     Vitals:   03/12/18 1158  BP: (!) 102/58  Pulse: 90  SpO2: 97%  Weight: 86.6 kg (191 lb)    Wt Readings from Last 3 Encounters:  03/12/18 86.6 kg (191 lb)  02/14/18 83.9 kg (185 lb)  02/11/18 87.7 kg (193 lb 6.4 oz)     PHYSICAL EXAM: General: Well appearing. No resp difficulty. HEENT: Normal Neck: Supple. JVP flat. Carotids 2+ bilat; no bruits. No thyromegaly or nodule noted. Cor: PMI nondisplaced. RRR, No M/G/R noted Lungs: CTAB, normal effort. Abdomen: Soft, non-tender, non-distended, no HSM. No bruits or masses. +BS  Extremities: No cyanosis, clubbing, or rash. R and LLE no edema.  Neuro: Alert & orientedx3, cranial nerves grossly intact. moves all 4 extremities w/o difficulty. Affect pleasant  ASSESSMENT & PLAN: 1. Chronic systolic CHF: Ischemic cardiomyopathy. Medtronic ICD single chamber. 2/19 admission for decompensated HF requiring milrinone. ECHO 2/7/019 EF 15% Mildly dilated RV. CPX in 2/19 was suggestive of severe limitation due to HF.  Currently NYHA class II symptoms, seems to be doing better than would be predicted from CPX and 2/19 admission.  Volume stable on exam and by Optivol,  And he is taking Lasix only prn.  RHC in 4/19 showed normal filling pressures and preserved cardiac output.  - Increase coreg to 25 mg bid.   - Continue digoxin 0.0625 mg daily, check level today.  - Continue Entresto 97/103 bid, BMET today.  - Continue spironolactone 25 mg daily.   - Has IVCD, not LBBB-like.  Saw Dr. KlCaryl Comeswill not upgrade ICD to CRT.  - We have discussed LVAD with him.  However, with good cardiac output on last RHC and NYHA class II symptoms (improved), would hold off for now.  2. Atrial flutter:  Paroxysmal.  Admitted in 2/19 with severe HF decompensation in setting of atrial flutter with  RVR.  He was cardioverted.  He then had atrial flutter ablation in 3/19.  Regular on exam today, no recent atrial arrhythmias noted on device interrogation today. - He is now off amiodarone (question of lung toxicity, would not re-challenge in future).   - Continue Eliquis 5 mg bid. Hemoglobin stable 14.2 02/14/18 3. Atrial fibrillation: Noted post-op CABG.  4. CAD: S/p CABG.  Last intervention was PCI to LCx in 2016. No chest pain.  - Continue atorvastatin, good lipids in 3/19.  - No ASA with Eliquis use.  5. Pulmonary fibrosis: PFTs in 2/19 were restrictive, and high resolution CT showed interstitial lung disease. Amiodarone was stopped due to concern for possible toxicity.  He has seen [name] => bronchoscopy suggestive of ILD related to amiodarone.  He is now on prednisone, this has helped his cough.    6. OSA: Uses CPAP. No change. 7. CKD: Stage 3. BMET today.  8. Pulmonary hypertension: Suspect group 3 PH due to lung disease (ILD).    BMET, dig level Increase coreg to 25 mg BID Follow up in 3 months  Georgiana Shore NP 03/12/2018   Patient seen with NP, agree with the above note.  Patient is doing well, NYHA class II. He is not volume overloaded by exam or Optivol.  Suspect residual dyspnea may be due to ILD.  He is currently on prednisone for treatment of ?amiodarone-related ILD.   - Increase Coreg to 25 mg bid.  - digoxin level, BMET today.  Creatinine higher recently in setting of nephrolithiasis.  - Followup in 3 months.   Loralie Champagne 03/13/2018

## 2018-03-12 NOTE — Patient Instructions (Signed)
Labs today (will call for abnormal results, otherwise no news is good news)  INCREASE Carvedilol to 25 mg Twice Daily  Please call [name] and schedule your follow up appointment.  Follow up with Dr. [name] in 3 months.

## 2018-03-18 ENCOUNTER — Encounter: Payer: Self-pay | Admitting: Internal Medicine

## 2018-03-18 ENCOUNTER — Telehealth (HOSPITAL_COMMUNITY): Payer: Self-pay | Admitting: *Deleted

## 2018-03-18 NOTE — Telephone Encounter (Signed)
Pt called stating he is out of Entresto and will not receive his shipment until Wed or Thursday this week.  Advised we can leave samples of the 49/51 mg tabs at front desk for him to p/u, advised he will have to take 2 of those tabs Twice daily, he is agreeable.  He also states he can not remember what medication was increased at last OV and would like a note about that left w/samples.  Advised carvedilol was increased to 25 mg Twice daily, copy of AVS left w/samples for pt.  Medication Samples have been provided to the patient.  Drug name: entresto       Strength: 49/51        Qty: 1    LOT: [id number]  Exp.Date: 12/21  Dosing instructions: take 2 tabs Twice daily   The patient has been instructed regarding the correct time, dose, and frequency of taking this medication, including desired effects and most common side effects.   Vincent Chandler 11:22 AM 03/18/2018

## 2018-03-20 ENCOUNTER — Telehealth: Payer: Self-pay | Admitting: Cardiology

## 2018-03-20 ENCOUNTER — Ambulatory Visit (INDEPENDENT_AMBULATORY_CARE_PROVIDER_SITE_OTHER): Payer: Medicare HMO | Admitting: *Deleted

## 2018-03-20 ENCOUNTER — Encounter: Payer: Self-pay | Admitting: Internal Medicine

## 2018-03-20 DIAGNOSIS — I255 Ischemic cardiomyopathy: Secondary | ICD-10-CM

## 2018-03-20 NOTE — Telephone Encounter (Signed)
Attempted to confirm remote transmission with pt. No answer and was unable to leave a message.

## 2018-03-21 NOTE — Progress Notes (Signed)
Remote ICD transmission.

## 2018-03-28 ENCOUNTER — Encounter: Payer: Self-pay | Admitting: Internal Medicine

## 2018-03-29 ENCOUNTER — Other Ambulatory Visit (HOSPITAL_COMMUNITY): Payer: Self-pay | Admitting: Pharmacist

## 2018-03-29 ENCOUNTER — Other Ambulatory Visit (HOSPITAL_COMMUNITY): Payer: Self-pay

## 2018-03-29 MED ORDER — SACUBITRIL-VALSARTAN 97-103 MG PO TABS: 1.0000 | ORAL_TABLET | Freq: Two times a day (BID) | ORAL | 11 refills | Status: DC

## 2018-03-29 MED ORDER — SACUBITRIL-VALSARTAN 97-103 MG PO TABS: 1.0000 | ORAL_TABLET | Freq: Two times a day (BID) | ORAL | 3 refills | Status: DC

## 2018-04-02 ENCOUNTER — Other Ambulatory Visit: Payer: Self-pay | Admitting: Cardiology

## 2018-04-05 MED FILL — metFORMIN HCL 500 MG TABS: 500 | 30 days supply | Qty: 60 | Fill #1

## 2018-04-05 MED FILL — CARVEDILOL 25 MG TABLET: 25 | 30 days supply | Qty: 60 | Fill #0

## 2018-04-05 MED FILL — SPIRONOLACTONE 25 MG TABLET: 25 | 30 days supply | Qty: 30 | Fill #3

## 2018-04-05 MED FILL — DIGOXIN 0.125 MG TABLET: 125 | 30 days supply | Qty: 15 | Fill #1

## 2018-04-05 MED FILL — ATORVASTATIN 40 MG TABLET: 40 | 30 days supply | Qty: 30 | Fill #1

## 2018-04-12 LAB — CUP PACEART REMOTE DEVICE CHECK
Battery Remaining Longevity: 116 mo
HIGH POWER IMPEDANCE MEASURED VALUE: 70 Ohm
Implantable Lead Implant Date: 20160715
Implantable Lead Model: 293
Implantable Pulse Generator Implant Date: 20160715
Lead Channel Impedance Value: 494 Ohm
Lead Channel Pacing Threshold Amplitude: 0.75 V
Lead Channel Pacing Threshold Pulse Width: 0.4 ms
Lead Channel Setting Pacing Pulse Width: 0.4 ms
Lead Channel Setting Sensing Sensitivity: 0.3 mV
MDC IDC LEAD LOCATION: 753860
MDC IDC LEAD SERIAL: 382379
MDC IDC MSMT BATTERY VOLTAGE: 3.01 V
MDC IDC MSMT LEADCHNL RV IMPEDANCE VALUE: 513 Ohm
MDC IDC MSMT LEADCHNL RV SENSING INTR AMPL: 9.375 mV
MDC IDC MSMT LEADCHNL RV SENSING INTR AMPL: 9.375 mV
MDC IDC SESS DTM: 20190919022502
MDC IDC SET LEADCHNL RV PACING AMPLITUDE: 2.5 V
MDC IDC STAT BRADY RV PERCENT PACED: 0.02 %

## 2018-04-23 MED FILL — FUROSEMIDE 40 MG TAB: 40 | 30 days supply | Qty: 30 | Fill #1

## 2018-04-23 MED FILL — DIGOXIN 0.125 MG TABLET: 125 | 30 days supply | Qty: 15 | Fill #2

## 2018-04-23 MED FILL — predniSONE 10 MG TABS: 10 | 30 days supply | Qty: 30 | Fill #2

## 2018-04-24 ENCOUNTER — Ambulatory Visit: Payer: Medicare HMO | Admitting: Internal Medicine

## 2018-04-24 VITALS — BP 118/68 | HR 91 | Resp 16 | Ht 71.0 in | Wt 202.0 lb

## 2018-04-24 DIAGNOSIS — I472 Ventricular tachycardia, unspecified: Secondary | ICD-10-CM

## 2018-04-24 DIAGNOSIS — I255 Ischemic cardiomyopathy: Secondary | ICD-10-CM

## 2018-04-24 DIAGNOSIS — I4892 Unspecified atrial flutter: Secondary | ICD-10-CM | POA: Diagnosis not present

## 2018-04-24 DIAGNOSIS — Z95810 Presence of automatic (implantable) cardiac defibrillator: Secondary | ICD-10-CM | POA: Diagnosis not present

## 2018-04-24 DIAGNOSIS — I5022 Chronic systolic (congestive) heart failure: Secondary | ICD-10-CM

## 2018-04-24 NOTE — Progress Notes (Signed)
Patient Care Team: [name], Alferd Apa, DO as PCP - General (Family Medicine) Burtis Junes, NP as Nurse Practitioner (Nurse Practitioner) Deboraha Sprang, MD as Consulting Physician (Cardiology) Allyn Kenner, MD (Dermatology)   HPI  Vincent Chandler is a 57 y.o. male Seen in followup for ischemic heart disease and sinus tachycardia.  He has an implanted ICD.  When he was seen last new Q waves were identified.   He has a history of atrial arrhythmias for which he underwent catheter ablation of atrial flutter 3/19-not withstanding some suggestions of atrial fibrillation.  Previously exposed to amiodarone but discontinued because of concerns of possible amiodarone lung toxicity.  Has been seen in heart failure clinic with discussions regarding LVAD following admission   2/19 with congestive heart failure.  Echocardiogram EF 15%;  right heart cath showed reasonable cardiac index and he was disinclined.  Consideration was given to CRT upgrade but he has a non-left bundle branch block IVCD .    DATE TEST EF   2012 Echo   25 %   2014/ [facility]    % 3v CAD> CABG  2/16 Echo  20-25%   1/16 .LHC  PCI Cx  4/19 R[facility]  CI 2.51  PCWP 12         Date Cr K Dig  5/19 1.36 4.4 0.4  9/19 2.2<<2,5 5.3 0.9    Given left ventricular low output, evaluation was initiated for LVAD  Functional status is about the same.  He is been on prednisone for some months now because of question of amiodarone toxicity.  Unfortunately he missed his follow-up appointment.  Have reached out to pulmonary.  No chest pain.  Mild edema.  He relates this to the prednisone.    Past Medical History:  Diagnosis Date  . AICD (automatic cardioverter/defibrillator) present   . Anginal pain ([place])   . Anxiety   . Asthma   . Atrial fibrillation-postoperative 11/28/2012  . Cardiomyopathy    alcohol use related  . CHF (congestive heart failure) ([place])   . Chronic back pain   . Coronary artery disease    drug eluting stent RCA  2005-EF 30%- s/p CABG x 4; 2/4 patent grafts with SVG-PLOM and SVG-RCA system totally occluded. There are collaterals from the LAD system to the RCA and the RCA is totally occluded proximally. There are no collaterals to the LCx territory. He then underwent successful PCI of the mid left circumflex artery with overlapping Synergy drug-eluting stents  . Depression   . Diabetes mellitus type II dx'd in the 1990's  . GERD (gastroesophageal reflux disease)    yrs ago  . H/O hiatal hernia   . History of hypogonadism   . History of kidney stones   . Hyperlipidemia   . Hypertension   . OSA on CPAP    "mask is broken; working on getting a new one" (01/15/2015; 10/03/2017)  . Pneumonia    "3-4 times" (10/03/2017)  . Urinary incontinence     Past Surgical History:  Procedure Laterality Date  . A-FLUTTER ABLATION N/A 09/12/2017   Procedure: A-FLUTTER ABLATION;  Surgeon: Deboraha Sprang, MD;  Location: [place] [facility];  Service: Cardiovascular;  Laterality: N/A;  . CARDIAC DEFIBRILLATOR PLACEMENT  01/15/2015  . CARDIOVERSION N/A 08/10/2017   Procedure: CARDIOVERSION;  Surgeon: Larey Dresser, MD;  Location: [facility] ENDOSCOPY;  Service: Cardiovascular;  Laterality: N/A;  . CORONARY ANGIOPLASTY WITH STENT PLACEMENT  ~ 2003  . CORONARY ARTERY BYPASS GRAFT N/A  08/15/2012   Procedure: CORONARY ARTERY BYPASS GRAFTING (CABG);  Surgeon: Ivin Poot, MD;  Location: [place];  Service: Open Heart Surgery;  Laterality: N/A;  Coronary Artery Bypass Grafting Times Four Using Left Internal Mammary Artery and Right Saphenous Leg Vein Harvested Endoscopically  . EP IMPLANTABLE DEVICE N/A 01/15/2015   Procedure: ICD Implant;  Surgeon: Deboraha Sprang, MD;  Location: [place] [facility];  Service: Cardiovascular;  Laterality: N/A;  . LEFT HEART CATHETERIZATION WITH CORONARY ANGIOGRAM N/A 08/08/2012   Procedure: LEFT HEART CATHETERIZATION WITH CORONARY ANGIOGRAM;  Surgeon: Peter M Martinique, MD;  Location: [facility] CATH LAB;  Service:  Cardiovascular;  Laterality: N/A;  . LEFT HEART CATHETERIZATION WITH CORONARY/GRAFT ANGIOGRAM N/A 07/21/2014   Procedure: LEFT HEART CATHETERIZATION WITH Beatrix Fetters;  Surgeon: Larey Dresser, MD;  Location: [facility] CATH LAB;  Service: Cardiovascular;  Laterality: N/A;  . MULTIPLE EXTRACTIONS WITH ALVEOLOPLASTY N/A 10/04/2017   Procedure: Extraction of tooth #'s 5 and 14 with alveoloplasty and gross debridement of remaining teeth;  Surgeon: [name], DDS;  Location: [place];  Service: Oral Surgery;  Laterality: N/A;  . PERCUTANEOUS CORONARY STENT INTERVENTION (PCI-S)  07/21/2014   Procedure: PERCUTANEOUS CORONARY STENT INTERVENTION (PCI-S);  Surgeon: Larey Dresser, MD;  Location: [facility] CATH LAB;  Service: Cardiovascular;;  . REFRACTIVE SURGERY Bilateral 1990's  . RIGHT HEART CATH N/A 10/03/2017   Procedure: RIGHT HEART CATH;  Surgeon: Larey Dresser, MD;  Location: [place] [facility];  Service: Cardiovascular;  Laterality: N/A;  . TEE WITHOUT CARDIOVERSION N/A 08/10/2017   Procedure: TRANSESOPHAGEAL ECHOCARDIOGRAM (TEE);  Surgeon: Larey Dresser, MD;  Location: [facility] ENDOSCOPY;  Service: Cardiovascular;  Laterality: N/A;  . TONSILLECTOMY  ~ 1970  . VIDEO BRONCHOSCOPY Bilateral 11/28/2017   Procedure: VIDEO BRONCHOSCOPY WITHOUT FLUORO;  Surgeon: Brand Males, MD;  Location: [facility] ENDOSCOPY;  Service: Endoscopy;  Laterality: Bilateral;    Current Outpatient Medications  Medication Sig Dispense Refill  . ACCU-CHEK FASTCLIX LANCETS MISC Use as directed once a day.  Dx code: E11.9 100 each 1  . albuterol (PROAIR HFA) 108 (90 Base) MCG/ACT inhaler Inhale 1-2 puffs into the lungs every 6 (six) hours as needed for wheezing or shortness of breath. 1 Inhaler 3  . apixaban (ELIQUIS) 5 MG TABS tablet Take 1 tablet (5 mg total) by mouth 2 (two) times daily. 180 tablet 3  . atorvastatin (LIPITOR) 40 MG tablet Take 1 tablet (40 mg total) by mouth at bedtime. 90 tablet 3  . blood glucose meter kit  and supplies Dispense based on patient and insurance preference. Use as directed once a day. Dx Code E11.9. 1 each 0  . Blood Glucose Monitoring Suppl (ACCU-CHEK GUIDE) w/Device KIT 1 each by Does not apply route daily. DX Code: E11.9 1 kit 0  . carvedilol (COREG) 25 MG tablet Take 1 tablet (25 mg total) by mouth 2 (two) times daily with a meal. 60 tablet 3  . digoxin (LANOXIN) 0.125 MG tablet Take 0.5 tablets (0.0625 mg total) by mouth daily. 15 tablet 5  . furosemide (LASIX) 40 MG tablet Take 1 tablet (40 mg total) by mouth daily as needed. 30 tablet 11  . glucose blood (ACCU-CHEK GUIDE) test strip Use as directed once a day.  Dx code: E11.9 100 each 1  . guaiFENesin (MUCINEX) 600 MG 12 hr tablet Take 1 tablet (600 mg total) by mouth 2 (two) times daily as needed for to loosen phlegm.    . metFORMIN (GLUCOPHAGE) 500 MG  tablet Take 1 tablet (500 mg total) by mouth 2 (two) times daily with a meal. 180 tablet 0  . mometasone-formoterol (DULERA) 200-5 MCG/ACT AERO Inhale 2 puffs into the lungs 2 (two) times daily. Further refills per PCP 1 Inhaler 0  . nitroGLYCERIN (NITROSTAT) 0.4 MG SL tablet Place 1 tablet (0.4 mg total) under the tongue every 5 (five) minutes as needed for chest pain. 25 tablet 3  . predniSONE (DELTASONE) 10 MG tablet Take 42mx2weeks,20mgx2weeks,then 166maily 180 tablet 1  . sacubitril-valsartan (ENTRESTO) 97-103 MG Take 1 tablet by mouth 2 (two) times daily. 60 tablet 11  . spironolactone (ALDACTONE) 25 MG tablet Take 1 tablet (25 mg total) by mouth daily. 30 tablet 3   No current facility-administered medications for this visit.     No Known Allergies  Review of Systems negative except from HPI and PMH  Physical Exam BP 118/68 (BP Location: Left Arm, Patient Position: Sitting)   Pulse 91   Resp 16   Ht _0  (1.803 m)   Wt 202 lb (91.6 kg)   BMI 28.17 kg/m   Well developed and nourished in no acute distress HENT normal cherubic cheeks  Neck supple with  JVP-flat Clear Regular rate and rhythm, no murmurs or gallops Abd-soft with active BS No Clubbing cyanosis tr edema Skin-warm and dry A & Oriented  Grossly normal sensory and motor function   ECG demonstrates sinus @ 91 23/13/36   Assessment and  Plan  ischemic cardiomyopathy  Congestive heart failure-chronic-systolic  3a  High Risk Medication Surveillance  ICD single chamber The patient's device was interrogated.  The information was reviewed. No changes were made in the programming.     Renal insuff acute   Atrial flutter  Atrial fib post cabg  No intercurrent atrial fibrillation or flutter  On Anticoagulation;  No bleeding issues   Without symptoms of ischemia  The patient had elevated creatinine borderline dig and potassium levels at his last blood draw.  We will check them again today.  Mildly volume overloaded.  Energy tolerance a little bit low; he thinks he states to his prednisone.  I reached out to pulmonary as it looks like he missed his last appointment and was without a recall.

## 2018-04-24 NOTE — Patient Instructions (Addendum)
Medication Instructions:  Your physician recommends that you continue on your current medications as directed. Please refer to the Current Medication list given to you today.  Labwork: You will have labs drawn today: CBC and BMP   Testing/Procedures: None ordered.  Follow-Up: Your physician recommends that you schedule a follow-up appointment in:   One Year with Dr Caryl Comes  Please follow up with Dr Chase Caller   Any Other Special Instructions Will Be Listed Below (If Applicable).     If you need a refill on your cardiac medications before your next appointment, please call your pharmacy.

## 2018-04-25 LAB — BASIC METABOLIC PANEL
BUN/Creatinine Ratio: 19 (ref 9–20)
BUN: 29 mg/dL — AB (ref 6–24)
CO2: 23 mmol/L (ref 20–29)
CREATININE: 1.49 mg/dL — AB (ref 0.76–1.27)
Calcium: 9.3 mg/dL (ref 8.7–10.2)
Chloride: 106 mmol/L (ref 96–106)
GFR calc Af Amer: 60 mL/min/{1.73_m2} (ref >59)
GFR calc non Af Amer: 52 mL/min/{1.73_m2} — ABNORMAL LOW (ref >59)
GLUCOSE: 224 mg/dL — AB (ref 65–99)
Potassium: 5 mmol/L (ref 3.5–5.2)
SODIUM: 142 mmol/L (ref 134–144)

## 2018-04-25 LAB — CBC
HEMOGLOBIN: 13.3 g/dL (ref 13.0–17.7)
Hematocrit: 37.8 % (ref 37.5–51.0)
MCH: 39 pg — ABNORMAL HIGH (ref 26.6–33.0)
MCHC: 35.2 g/dL (ref 31.5–35.7)
MCV: 111 fL — ABNORMAL HIGH (ref 79–97)
NRBC: 1 % — ABNORMAL HIGH (ref 0–0)
Platelets: 200 10*3/uL (ref 150–450)
RBC: 3.41 x10E6/uL — ABNORMAL LOW (ref 4.14–5.80)
RDW: 15.5 % — ABNORMAL HIGH (ref 12.3–15.4)
WBC: 8.1 10*3/uL (ref 3.4–10.8)

## 2018-05-08 ENCOUNTER — Other Ambulatory Visit (HOSPITAL_COMMUNITY): Payer: Self-pay | Admitting: Cardiology

## 2018-05-08 MED FILL — CARVEDILOL 25 MG TABLET: 25 | 30 days supply | Qty: 60 | Fill #1

## 2018-05-08 MED FILL — SPIRONOLACTONE 25 MG TABLET: 25 | 30 days supply | Qty: 30 | Fill #2

## 2018-05-14 MED FILL — metFORMIN HCL 500 MG TABS: 500 | 30 days supply | Qty: 60 | Fill #2

## 2018-05-28 ENCOUNTER — Other Ambulatory Visit: Payer: Self-pay

## 2018-05-28 ENCOUNTER — Ambulatory Visit (INDEPENDENT_AMBULATORY_CARE_PROVIDER_SITE_OTHER): Payer: Medicare HMO | Admitting: Physician Assistant

## 2018-05-28 ENCOUNTER — Encounter: Payer: Self-pay | Admitting: Physician Assistant

## 2018-05-28 VITALS — BP 100/62 | HR 43 | Temp 99.2°F | Resp 20 | Ht 70.0 in | Wt 208.4 lb

## 2018-05-28 DIAGNOSIS — J029 Acute pharyngitis, unspecified: Secondary | ICD-10-CM

## 2018-05-28 DIAGNOSIS — R05 Cough: Secondary | ICD-10-CM

## 2018-05-28 DIAGNOSIS — R059 Cough, unspecified: Secondary | ICD-10-CM

## 2018-05-28 LAB — POCT RAPID STREP A (OFFICE): Rapid Strep A Screen: NEGATIVE

## 2018-05-28 MED ORDER — BENZONATATE 100 MG PO CAPS: 100.0000 mg | ORAL_CAPSULE | Freq: Three times a day (TID) | ORAL | 0 refills | Status: DC | PRN

## 2018-05-28 MED ORDER — BENZOCAINE-MENTHOL 15-2.6 MG MT LOZG: 1.0000 | LOZENGE | OROMUCOSAL | 0 refills | Status: DC | PRN

## 2018-05-28 NOTE — Patient Instructions (Addendum)
  Cepacol throat lozenges.  Tessalon is for cough during the day. This should not make you drowsy.    Stay well hydrated. Get lost of rest. Wash your hands often.  ? Gargle with 8 oz of salt water ( tsp of salt per 1 qt of water) as often as every 1-2 hours to soothe your throat.  Gargle liquid benadryl. Come back if you are not improving or if your symptoms worsen.   -Foods that can help speed recovery: honey, garlic, chicken soup, elderberries, green tea.  -Supplements that can help speed recovery: vitamin C, zinc, elderberry extract, quercetin, ginseng, selenium  For sore throat try using a honey-based tea. Use 3 teaspoons of honey with juice squeezed from half lemon. Place shaved pieces of ginger into 1/2-1 cup of water and warm over stove top. Then mix the ingredients and repeat every 4 hours as needed.  Cough Syrup Recipe: Sweet Lemon & Honey Thyme  Ingredients . a handful of fresh thyme sprigs   . 1 pint of water (2 cups)  . 1/2 cup honey (raw is best, but regular will do)  . 1/2 lemon chopped Instructions 1. Place the lemon in the pint jar and cover with the honey. The honey will macerate the lemons and draw out liquids which taste so delicious! 2. Meanwhile, toss the thyme leaves into a saucepan and cover them with the water. 3. Bring the water to a gentle simmer and reduce it to half, about a cup of tea. 4. When the tea is reduced and cooled a bit, strain the sprigs & leaves, add it into the pint jar and stir it well. 5. Give it a shake and use a spoonful as needed. 6. Store your homemade cough syrup in the refrigerator for about a month.  What causes a cough?  In adults, common causes of a cough include: ?An infection of the airways or lungs (such as the common cold) ?Postnasal drip - Postnasal drip is when mucus from the nose drips down or flows along the back of the throat. Postnasal drip can happen when people have: .A cold .Allergies .A sinus infection - The sinuses  are hollow areas in the bones of the face that open into the nose. ?Lung conditions, like asthma and chronic obstructive pulmonary disease (COPD) - Both of these conditions can make it hard to breathe. COPD is usually caused by smoking. ?Acid reflux - Acid reflux is when the acid that is normally in your stomach backs up into your esophagus (the tube that carries food from your mouth to your stomach). ?A side effect from blood pressure medicines called "ACE inhibitors" ?Smoking cigarettes  Is there anything I can do on my own to get rid of my cough?  Yes. To help get rid of your cough, you can: ?Use a humidifier in your bedroom ?Use an over-the-counter cough medicine, or suck on cough drops or hard candy ?Stop smoking, if you smoke ?If you have allergies, avoid the things you are allergic to (like pollen, dust, animals, or mold) If you have acid reflux, your doctor or nurse will tell you which lifestyle changes can help reduce symptoms.

## 2018-05-28 NOTE — Progress Notes (Signed)
Vincent Chandler  MRN: [id number] DOB: 1961-02-23  PCP: Vincent Held, DO  Subjective:  Pt is a 57 year old male who presents to clinic for sore throat and nasal congestion.  He woke up this morning and throat felt like "razor blades" in his throat. No difficulty swallowing or eating. Denies drooling, stridor, unilateral pain.  Endorses nasal congestion x 2-3 days with productive cough. No known sick contacts.   He has been taking Robitussin.  Denies fever, chills, body aches, HA, voice changes, shob, chest pain, wheezing.   Pt  has a past medical history of AICD (automatic cardioverter/defibrillator) present, Anginal pain ([place]), Anxiety, Asthma, Atrial fibrillation-postoperative (11/28/2012), Cardiomyopathy, CHF (congestive heart failure) ([place]), Chronic back pain, Coronary artery disease, Depression, Diabetes mellitus type II (dx'd in the 1990's), GERD (gastroesophageal reflux disease), H/O hiatal hernia, History of hypogonadism, History of kidney stones, Hyperlipidemia, Hypertension, OSA on CPAP, Pneumonia, and Urinary incontinence.  Review of Systems  Constitutional: Negative for chills, diaphoresis, fatigue and fever.  HENT: Positive for congestion and sore throat. Negative for drooling, postnasal drip, rhinorrhea, sinus pressure, sinus pain, sneezing and trouble swallowing.   Respiratory: Positive for cough. Negative for shortness of breath and wheezing.     Patient Active Problem List   Diagnosis Date Noted  . ILD (interstitial lung disease) ([place])   . Chronic periodontitis 08/30/2017  . Atrial flutter ([place]) 08/08/2017  . Dyspnea 08/07/2017  . Hyperglycemia 08/07/2017  . CKD (chronic kidney disease) 2-3 07/19/2014  . NSTEMI (non-ST elevated myocardial infarction) ([name]) 07/18/2014  . OSA (obstructive sleep apnea) 07/18/2014  . Tachycardia   . V tach ([place]) 07/17/2014  . Hypoxemia 07/17/2014  . CHF (congestive heart failure) ([place]) 06/04/2012  . Sinus tachycardia  12/29/2010  . Coronary artery disease prior RCA stent with new inferior Q waves 10/18/2010  . Ischemic and nonischemic cardiomyopathy   10/18/2010  . Uncontrolled type 2 diabetes mellitus without complication, without long-term current use of insulin ([place]) 03/10/2010  . Hyperlipidemia 03/10/2010  . Essential hypertension 03/10/2010    Current Outpatient Medications on File Prior to Visit  Medication Sig Dispense Refill  . ACCU-CHEK FASTCLIX LANCETS MISC Use as directed once a day.  Dx code: E11.9 100 each 1  . albuterol (PROAIR HFA) 108 (90 Base) MCG/ACT inhaler Inhale 1-2 puffs into the lungs every 6 (six) hours as needed for wheezing or shortness of breath. 1 Inhaler 3  . apixaban (ELIQUIS) 5 MG TABS tablet Take 1 tablet (5 mg total) by mouth 2 (two) times daily. 180 tablet 3  . atorvastatin (LIPITOR) 40 MG tablet Take 1 tablet (40 mg total) by mouth at bedtime. 90 tablet 3  . blood glucose meter kit and supplies Dispense based on patient and insurance preference. Use as directed once a day. Dx Code E11.9. 1 each 0  . Blood Glucose Monitoring Suppl (ACCU-CHEK GUIDE) w/Device KIT 1 each by Does not apply route daily. DX Code: E11.9 1 kit 0  . carvedilol (COREG) 25 MG tablet Take 1 tablet (25 mg total) by mouth 2 (two) times daily with a meal. 60 tablet 3  . digoxin (LANOXIN) 0.125 MG tablet Take 0.5 tablets (0.0625 mg total) by mouth daily. 15 tablet 5  . furosemide (LASIX) 40 MG tablet Take 1 tablet (40 mg total) by mouth daily as needed. 30 tablet 11  . guaiFENesin (MUCINEX) 600 MG 12 hr tablet Take 1 tablet (600 mg total) by mouth 2 (two) times daily as needed for to  loosen phlegm.    . metFORMIN (GLUCOPHAGE) 500 MG tablet Take 1 tablet (500 mg total) by mouth 2 (two) times daily with a meal. 180 tablet 0  . mometasone-formoterol (DULERA) 200-5 MCG/ACT AERO Inhale 2 puffs into the lungs 2 (two) times daily. Further refills per PCP 1 Inhaler 0  . nitroGLYCERIN (NITROSTAT) 0.4 MG SL tablet  Place 1 tablet (0.4 mg total) under the tongue every 5 (five) minutes as needed for chest pain. 25 tablet 3  . predniSONE (DELTASONE) 10 MG tablet Take 35mx2weeks,20mgx2weeks,then 156maily 180 tablet 1  . sacubitril-valsartan (ENTRESTO) 97-103 MG Take 1 tablet by mouth 2 (two) times daily. 60 tablet 11  . spironolactone (ALDACTONE) 25 MG tablet TAKE 1 TABLET BY MOUTH DAILY. 30 tablet 5  . glucose blood (ACCU-CHEK GUIDE) test strip Use as directed once a day.  Dx code: E11.9 (Patient not taking: Reported on 05/28/2018) 100 each 1   No current facility-administered medications on file prior to visit.     No Known Allergies   Objective:  BP 100/62 (BP Location: Left Arm, Patient Position: Sitting, Cuff Size: Normal)   Pulse (!) 43   Temp 99.2 F (37.3 C) (Oral)   Resp 20   Ht _0  (1.778 m)   Wt 208 lb 6.4 oz (94.5 kg)   SpO2 95%   BMI 29.90 kg/m   Physical Exam  Constitutional: He is oriented to person, place, and time. No distress.  HENT:  Right Ear: Tympanic membrane normal.  Left Ear: Tympanic membrane normal.  Nose: Mucosal edema present. No rhinorrhea. Right sinus exhibits no maxillary sinus tenderness and no frontal sinus tenderness. Left sinus exhibits no maxillary sinus tenderness and no frontal sinus tenderness.  Mouth/Throat: Oropharynx is clear and moist and mucous membranes are normal.  Cardiovascular: Normal rate, regular rhythm and normal heart sounds.  Pulmonary/Chest: Effort normal and breath sounds normal. No respiratory distress. He has no wheezes. He has no rhonchi. He has no rales.  Neurological: He is alert and oriented to person, place, and time.  Skin: Skin is warm and dry.  Psychiatric: Judgment normal.  Vitals reviewed.  Results for orders placed or performed in visit on 05/28/18  POCT rapid strep A  Result Value Ref Range   Rapid Strep A Screen Negative Negative    Assessment and Plan :  1. Sore throat - pt presents c/o sore throat x 1 day. PE  unremarkable, vitals are stable. Negative rapid strep- will send culture. Plan to treat supportively. RTC if symptoms worsen.  - POCT rapid strep A - Culture, Group A Strep - Benzocaine-Menthol 15-2.6 MG LOZG; Use as directed 1 lozenge in the mouth or throat every 2 (two) hours as needed.  Dispense: 18 lozenge; Refill: 0  2. Cough - benzonatate (TESSALON) 100 MG capsule; Take 1-2 capsules (100-200 mg total) by mouth 3 (three) times daily as needed for cough.  Dispense: 40 capsule; Refill: 0   WhMercer PodPA-C  [facility]/26/2019 5:25 PM  Please note: Portions of this report may have been transcribed using dragon voice recognition software. Every effort was made to ensure accuracy; however, inadvertent computerized transcription errors may be present.

## 2018-05-29 MED FILL — predniSONE 10 MG TABS: 10 | 30 days supply | Qty: 30 | Fill #3

## 2018-05-29 MED FILL — ATORVASTATIN 40 MG TABLET: 40 | 30 days supply | Qty: 30 | Fill #2

## 2018-05-29 MED FILL — DIGOXIN 0.125 MG TABLET: 125 | 30 days supply | Qty: 15 | Fill #3

## 2018-05-30 LAB — CULTURE, GROUP A STREP: Strep A Culture: NEGATIVE

## 2018-06-01 LAB — CUP PACEART INCLINIC DEVICE CHECK
Battery Remaining Longevity: 115 mo
Battery Voltage: 3.01 V
Date Time Interrogation Session: 20191023171934
HIGH POWER IMPEDANCE MEASURED VALUE: 68 Ohm
Implantable Lead Implant Date: 20160715
Implantable Lead Location: 753860
Implantable Lead Model: 293
Implantable Lead Serial Number: 382379
Implantable Pulse Generator Implant Date: 20160715
Lead Channel Impedance Value: 456 Ohm
Lead Channel Impedance Value: 513 Ohm
Lead Channel Pacing Threshold Amplitude: 0.75 V
Lead Channel Pacing Threshold Pulse Width: 0.4 ms
Lead Channel Sensing Intrinsic Amplitude: 8.875 mV
Lead Channel Setting Pacing Pulse Width: 0.4 ms
Lead Channel Setting Sensing Sensitivity: 0.3 mV
MDC IDC SET LEADCHNL RV PACING AMPLITUDE: 2.5 V
MDC IDC STAT BRADY RV PERCENT PACED: 0.01 %

## 2018-06-04 ENCOUNTER — Emergency Department (HOSPITAL_BASED_OUTPATIENT_CLINIC_OR_DEPARTMENT_OTHER): Payer: Medicare HMO

## 2018-06-04 ENCOUNTER — Other Ambulatory Visit: Payer: Self-pay

## 2018-06-04 ENCOUNTER — Encounter (HOSPITAL_BASED_OUTPATIENT_CLINIC_OR_DEPARTMENT_OTHER): Payer: Self-pay | Admitting: *Deleted

## 2018-06-04 ENCOUNTER — Emergency Department (HOSPITAL_BASED_OUTPATIENT_CLINIC_OR_DEPARTMENT_OTHER)
Admission: EM | Admit: 2018-06-04 | Discharge: 2018-06-04 | Disposition: A | Discharge status: Home / Self Care | Payer: Medicare HMO | Attending: Emergency Medicine | Admitting: Emergency Medicine

## 2018-06-04 DIAGNOSIS — I509 Heart failure, unspecified: Secondary | ICD-10-CM | POA: Insufficient documentation

## 2018-06-04 DIAGNOSIS — R05 Cough: Secondary | ICD-10-CM | POA: Diagnosis not present

## 2018-06-04 DIAGNOSIS — I130 Hypertensive heart and chronic kidney disease with heart failure and stage 1 through stage 4 chronic kidney disease, or unspecified chronic kidney disease: Secondary | ICD-10-CM | POA: Diagnosis not present

## 2018-06-04 DIAGNOSIS — I2510 Atherosclerotic heart disease of native coronary artery without angina pectoris: Secondary | ICD-10-CM | POA: Diagnosis not present

## 2018-06-04 DIAGNOSIS — E119 Type 2 diabetes mellitus without complications: Secondary | ICD-10-CM | POA: Insufficient documentation

## 2018-06-04 DIAGNOSIS — R531 Weakness: Secondary | ICD-10-CM | POA: Diagnosis not present

## 2018-06-04 DIAGNOSIS — N183 Chronic kidney disease, stage 3 (moderate): Secondary | ICD-10-CM | POA: Diagnosis not present

## 2018-06-04 DIAGNOSIS — R059 Cough, unspecified: Secondary | ICD-10-CM

## 2018-06-04 DIAGNOSIS — Z95810 Presence of automatic (implantable) cardiac defibrillator: Secondary | ICD-10-CM | POA: Diagnosis not present

## 2018-06-04 DIAGNOSIS — I252 Old myocardial infarction: Secondary | ICD-10-CM | POA: Insufficient documentation

## 2018-06-04 LAB — CBC WITH DIFFERENTIAL/PLATELET
Abs Immature Granulocytes: 0.07 10*3/uL (ref 0.00–0.07)
BASOS ABS: 0 10*3/uL (ref 0.0–0.1)
Basophils Relative: 0 %
Eosinophils Absolute: 0 10*3/uL (ref 0.0–0.5)
Eosinophils Relative: 0 %
HCT: 41 % (ref 39.0–52.0)
Hemoglobin: 13.4 g/dL (ref 13.0–17.0)
Immature Granulocytes: 1 %
Lymphocytes Relative: 11 %
Lymphs Abs: 0.9 10*3/uL (ref 0.7–4.0)
MCH: 38.8 pg — ABNORMAL HIGH (ref 26.0–34.0)
MCHC: 32.7 g/dL (ref 30.0–36.0)
MCV: 118.8 fL — ABNORMAL HIGH (ref 80.0–100.0)
Monocytes Absolute: 0.4 10*3/uL (ref 0.1–1.0)
Monocytes Relative: 5 %
Neutro Abs: 7.2 10*3/uL (ref 1.7–7.7)
Neutrophils Relative %: 83 %
PLATELETS: 214 10*3/uL (ref 150–400)
RBC: 3.45 MIL/uL — AB (ref 4.22–5.81)
RDW: 13.7 % (ref 11.5–15.5)
WBC: 8.6 10*3/uL (ref 4.0–10.5)
nRBC: 0.2 % (ref 0.0–0.2)

## 2018-06-04 MED ORDER — AZITHROMYCIN 250 MG PO TABS
500.0000 mg | ORAL_TABLET | Freq: Once | ORAL | Status: AC
  Administered 2018-06-04: 500 mg via ORAL
  Filled 2018-06-04: qty 2

## 2018-06-04 MED ORDER — HYDROCODONE-HOMATROPINE 5-1.5 MG/5ML PO SYRP: 5.0000 mL | ORAL_SOLUTION | Freq: Four times a day (QID) | ORAL | 0 refills | Status: DC | PRN

## 2018-06-04 MED ORDER — AZITHROMYCIN 250 MG PO TABS: 250.0000 mg | ORAL_TABLET | Freq: Every day | ORAL | 0 refills | Status: DC

## 2018-06-04 MED FILL — HYDROCODONE-HOMATROPINE SOL: 5-1.5 | 6 days supply | Qty: 120 | Fill #0

## 2018-06-04 MED FILL — AZITHROMYCIN 250 MG TABLET: 250 | 4 days supply | Qty: 4 | Fill #0

## 2018-06-04 NOTE — ED Triage Notes (Signed)
Cough for a week. She was seen at [facility] a week ago and given Benzonatate. He is no better and feels he has pneumonia.

## 2018-06-04 NOTE — ED Notes (Signed)
Pt refused to be stuck again to get his CMP. He said he had been stuck twice, and both times they blew his veins.

## 2018-06-04 NOTE — ED Notes (Signed)
NAD at this time. Pt is stable and going home.  

## 2018-06-04 NOTE — ED Provider Notes (Signed)
Emergency Department Provider Note   I have reviewed the triage vital signs and the nursing notes.   HISTORY  Chief Complaint Cough   HPI Vincent Chandler is a 57 y.o. male with multiple medical pons document below the presents to the emergency department today secondary to a cough.  Patient states that he had a cough for approximately 7 to 8 days now.  Patient states that initially it was a dry hacking cough and he went to urgent care where they start him on Tessalon Perles secondary to a reassuring exam but since that time is become more productive with yellow chunks coming out he has had more weakness.  No fevers or chills.  He states he has had heart failure exacerbations in the past but he has no swelling legs or swollen arms like he would with that.  No rashes.  No other symptoms. No other associated or modifying symptoms.    Past Medical History:  Diagnosis Date  . AICD (automatic cardioverter/defibrillator) present   . Anginal pain ([place])   . Anxiety   . Asthma   . Atrial fibrillation-postoperative 11/28/2012  . Cardiomyopathy    alcohol use related  . CHF (congestive heart failure) ([place])   . Chronic back pain   . Coronary artery disease    drug eluting stent RCA 2005-EF 30%- s/p CABG x 4; 2/4 patent grafts with SVG-PLOM and SVG-RCA system totally occluded. There are collaterals from the LAD system to the RCA and the RCA is totally occluded proximally. There are no collaterals to the LCx territory. He then underwent successful PCI of the mid left circumflex artery with overlapping Synergy drug-eluting stents  . Depression   . Diabetes mellitus type II dx'd in the 1990's  . GERD (gastroesophageal reflux disease)    yrs ago  . H/O hiatal hernia   . History of hypogonadism   . History of kidney stones   . Hyperlipidemia   . Hypertension   . OSA on CPAP    "mask is broken; working on getting a new one" (01/15/2015; 10/03/2017)  . Pneumonia    "3-4 times" (10/03/2017)  .  Urinary incontinence     Patient Active Problem List   Diagnosis Date Noted  . ILD (interstitial lung disease) ([place])   . Chronic periodontitis 08/30/2017  . Atrial flutter ([place]) 08/08/2017  . Dyspnea 08/07/2017  . Hyperglycemia 08/07/2017  . CKD (chronic kidney disease) 2-3 07/19/2014  . NSTEMI (non-ST elevated myocardial infarction) ([place]) 07/18/2014  . OSA (obstructive sleep apnea) 07/18/2014  . Tachycardia   . V tach ([place]) 07/17/2014  . Hypoxemia 07/17/2014  . CHF (congestive heart failure) ([place]) 06/04/2012  . Sinus tachycardia 12/29/2010  . Coronary artery disease prior RCA stent with new inferior Q waves 10/18/2010  . Ischemic and nonischemic cardiomyopathy   10/18/2010  . Uncontrolled type 2 diabetes mellitus without complication, without long-term current use of insulin ([place]) 03/10/2010  . Hyperlipidemia 03/10/2010  . Essential hypertension 03/10/2010    Past Surgical History:  Procedure Laterality Date  . A-FLUTTER ABLATION N/A 09/12/2017   Procedure: A-FLUTTER ABLATION;  Surgeon: Deboraha Sprang, MD;  Location: [facility];  Service: Cardiovascular;  Laterality: N/A;  . CARDIAC DEFIBRILLATOR PLACEMENT  01/15/2015  . CARDIOVERSION N/A 08/10/2017   Procedure: CARDIOVERSION;  Surgeon: Larey Dresser, MD;  Location: [facility] ENDOSCOPY;  Service: Cardiovascular;  Laterality: N/A;  . CORONARY ANGIOPLASTY WITH STENT PLACEMENT  ~ 2003  . CORONARY ARTERY BYPASS GRAFT N/A 08/15/2012  Procedure: CORONARY ARTERY BYPASS GRAFTING (CABG);  Surgeon: Ivin Poot, MD;  Location: [facility];  Service: Open Heart Surgery;  Laterality: N/A;  Coronary Artery Bypass Grafting Times Four Using Left Internal Mammary Artery and Right Saphenous Leg Vein Harvested Endoscopically  . EP IMPLANTABLE DEVICE N/A 01/15/2015   Procedure: ICD Implant;  Surgeon: Deboraha Sprang, MD;  Location: [place];  Service: Cardiovascular;  Laterality: N/A;  . LEFT HEART CATHETERIZATION WITH CORONARY ANGIOGRAM N/A  08/08/2012   Procedure: LEFT HEART CATHETERIZATION WITH CORONARY ANGIOGRAM;  Surgeon: Peter M Martinique, MD;  Location: [facility] CATH LAB;  Service: Cardiovascular;  Laterality: N/A;  . LEFT HEART CATHETERIZATION WITH CORONARY/GRAFT ANGIOGRAM N/A 07/21/2014   Procedure: LEFT HEART CATHETERIZATION WITH Beatrix Fetters;  Surgeon: Larey Dresser, MD;  Location: [facility] CATH LAB;  Service: Cardiovascular;  Laterality: N/A;  . MULTIPLE EXTRACTIONS WITH ALVEOLOPLASTY N/A 10/04/2017   Procedure: Extraction of tooth #'s 5 and 14 with alveoloplasty and gross debridement of remaining teeth;  Surgeon: [name], DDS;  Location: [place];  Service: Oral Surgery;  Laterality: N/A;  . PERCUTANEOUS CORONARY STENT INTERVENTION (PCI-S)  07/21/2014   Procedure: PERCUTANEOUS CORONARY STENT INTERVENTION (PCI-S);  Surgeon: Larey Dresser, MD;  Location: [facility] CATH LAB;  Service: Cardiovascular;;  . REFRACTIVE SURGERY Bilateral 1990's  . RIGHT HEART CATH N/A 10/03/2017   Procedure: RIGHT HEART CATH;  Surgeon: Larey Dresser, MD;  Location: [place] [facility];  Service: Cardiovascular;  Laterality: N/A;  . TEE WITHOUT CARDIOVERSION N/A 08/10/2017   Procedure: TRANSESOPHAGEAL ECHOCARDIOGRAM (TEE);  Surgeon: Larey Dresser, MD;  Location: [facility] ENDOSCOPY;  Service: Cardiovascular;  Laterality: N/A;  . TONSILLECTOMY  ~ 1970  . VIDEO BRONCHOSCOPY Bilateral 11/28/2017   Procedure: VIDEO BRONCHOSCOPY WITHOUT FLUORO;  Surgeon: Brand Males, MD;  Location: [facility] ENDOSCOPY;  Service: Endoscopy;  Laterality: Bilateral;    Current Outpatient Rx  . Order #: [id number] Class: Normal  . Order #: [id number] Class: Normal  . Order #: [id number] Class: Normal  . Order #: [id number] Class: Normal  . [START ON 06/05/2018] Order #: [id number] Class: Print  . Order #: [id number] Class: Normal  . Order #: [id number] Class: Normal  . Order #: [id number] Class: Print  . Order #: [id number] Class: Normal  . Order #: [id number] Class: Normal  . Order #:  [id number] Class: Normal  . Order #: [id number] Class: Normal  . Order #: [id number] Class: Normal  . Order #: [id number] Class: OTC  . Order #: [id number] Class: Print  . Order #: [id number] Class: Normal  . Order #: [id number] Class: Normal  . Order #: [id number] Class: Normal  . Order #: [id number] Class: Normal  . Order #: [id number] Class: Print  . Order #: [id number] Class: Normal    Allergies Patient has no known allergies.  Family History  Problem Relation Age of Onset  . Hypertension Mother   . Diabetes Mother   . Hypertension Father   . Diabetes Father   . Hyperlipidemia Father     Social History Social History   Tobacco Use  . Smoking status: Never Smoker  . Smokeless tobacco: Current User    Types: Chew  Substance Use Topics  . Alcohol use: Yes    Alcohol/week: 3.0 standard drinks    Types: 3 Glasses of wine per week  . Drug use: No    Review of Systems  All other systems negative except as documented in the HPI. All pertinent positives and negatives as reviewed in the HPI. ____________________________________________   PHYSICAL EXAM:  VITAL SIGNS: ED Triage Vitals  Enc Vitals Group  BP 06/04/18 1530 97/61     Pulse Rate 06/04/18 1530 73     Resp 06/04/18 1530 16     Temp 06/04/18 1530 98.3 F (36.8 C)     Temp Source 06/04/18 1530 Oral     SpO2 06/04/18 1530 97 %     Weight 06/04/18 1525 208 lb 5.4 oz (94.5 kg)     Height 06/04/18 1525 _0  (1.778 m)     Head Circumference --      Peak Flow --      Pain Score 06/04/18 1525 0     Pain Loc --      Pain Edu? --      Excl. in [place]? --     Constitutional: Alert and oriented. Well appearing and in no acute distress. Eyes: Conjunctivae are normal. PERRL. EOMI. Head: Atraumatic. Nose: No congestion/rhinnorhea. Mouth/Throat: Mucous membranes are moist.  Oropharynx non-erythematous. Neck: No stridor.  No meningeal signs.   Cardiovascular: Normal rate, regular rhythm. Good peripheral circulation. Grossly normal  heart sounds.   Respiratory: Normal respiratory effort.  No retractions. Lungs CTAB. Gastrointestinal: Soft and nontender. No distention.  Musculoskeletal: No lower extremity tenderness nor edema. No gross deformities of extremities. Neurologic:  Normal speech and language. No gross focal neurologic deficits are appreciated.  Skin:  Skin is warm, dry and intact. No rash noted.   ____________________________________________   LABS (all labs ordered are listed, but only abnormal results are displayed)  Labs Reviewed  CBC WITH DIFFERENTIAL/PLATELET - Abnormal; Notable for the following components:      Result Value   RBC 3.45 (*)    MCV 118.8 (*)    MCH 38.8 (*)    All other components within normal limits   ____________________________________________  EKG   EKG Interpretation  Date/Time:  Tuesday June 04 2018 15:38:19 EST Ventricular Rate:  71 PR Interval:    QRS Duration: 119 QT Interval:  402 QTC Calculation: 437 R Axis:   -74 Text Interpretation:  Incomplete analysis due to missing data in precordial lead(s) Sinus rhythm Ventricular premature complex Prolonged PR interval Nonspecific IVCD with LAD Inferior infarct, old Abnormal lateral Q waves Anterior infarct, old Baseline wander in lead(s) V1 V4 Missing lead(s): V3 No significant change since last tracing since july Confirmed by Merrily Pew [phone]) on 06/04/2018 3:55:40 PM       ____________________________________________  RADIOLOGY  Dg Chest 2 View  Result Date: 06/04/2018 CLINICAL DATA:  Cough and congestion for 5 days. EXAM: CHEST - 2 VIEW COMPARISON:  August 09, 2017 FINDINGS: Reticular passes in the lung bases remain, similar to previous studies. Mild cardiomegaly. Stable AICD device. No other interval change or acute abnormalities. IMPRESSION: Reticular changes in the bases are similar in the interval, consistent with the patient's known fibrosis. No acute infiltrate identified. Electronically Signed    By: Dorise Bullion III M.D   On: 06/04/2018 15:57    ____________________________________________   PROCEDURES  Procedure(s) performed:   Procedures   ____________________________________________   INITIAL IMPRESSION / ASSESSMENT AND PLAN / ED COURSE  Suspect sinus infection w/ PND vs community acquired pneumonia. Will get xr, basic labs but likely discharge.   Labs unremarkable chest x-ray normal.  Patient when resting has oxygen saturations are in 8991% range but when he is awake and alert his oxygen saturations are 94-97 and this is somewhat due to airway obstruction secondary to positioning and body habitus.  I do not think he is truly hypoxic.  Will treat for  [facility] pneumonia with PCP follow-up.  Low suspicion for heart failure exacerbation this time     Pertinent labs & imaging results that were available during my care of the patient were reviewed by me and considered in my medical decision making (see chart for details).  ____________________________________________  FINAL CLINICAL IMPRESSION(S) / ED DIAGNOSES  Final diagnoses:  Cough     MEDICATIONS GIVEN DURING THIS VISIT:  Medications  azithromycin (ZITHROMAX) tablet 500 mg (500 mg Oral Given 06/04/18 1709)     NEW OUTPATIENT MEDICATIONS STARTED DURING THIS VISIT:  Discharge Medication List as of 06/04/2018  5:04 PM    START taking these medications   Details  azithromycin (ZITHROMAX) 250 MG tablet Take 1 tablet (250 mg total) by mouth daily. Take 1 every day until finished., Starting Wed 06/05/2018, Print    HYDROcodone-homatropine (HYCODAN) 5-1.5 MG/5ML syrup Take 5 mLs by mouth every 6 (six) hours as needed for cough., Starting Tue 06/04/2018, Print        Note:  This note was prepared with assistance of Dragon voice recognition software. Occasional wrong-word or sound-a-like substitutions may have occurred due to the inherent limitations of voice recognition software.   Merrily Pew,  MD 06/04/18 [phone]

## 2018-06-05 ENCOUNTER — Ambulatory Visit: Payer: Medicare HMO | Admitting: Internal Medicine

## 2018-06-05 ENCOUNTER — Encounter: Payer: Self-pay | Admitting: Internal Medicine

## 2018-06-05 DIAGNOSIS — Z0289 Encounter for other administrative examinations: Secondary | ICD-10-CM

## 2018-06-11 MED FILL — CARVEDILOL 25 MG TABLET: 25 | 30 days supply | Qty: 60 | Fill #2

## 2018-06-13 ENCOUNTER — Ambulatory Visit (HOSPITAL_COMMUNITY)
Admission: RE | Admit: 2018-06-13 | Discharge: 2018-06-13 | Disposition: A | Discharge status: Home / Self Care | Payer: Medicare HMO | Source: Ambulatory Visit | Attending: Cardiology | Admitting: Cardiology

## 2018-06-13 ENCOUNTER — Encounter (HOSPITAL_COMMUNITY): Payer: Self-pay | Admitting: Cardiology

## 2018-06-13 ENCOUNTER — Other Ambulatory Visit (HOSPITAL_COMMUNITY): Payer: Self-pay | Admitting: *Deleted

## 2018-06-13 ENCOUNTER — Telehealth: Payer: Self-pay | Admitting: Internal Medicine

## 2018-06-13 VITALS — BP 110/62 | HR 105 | Wt 210.2 lb

## 2018-06-13 DIAGNOSIS — J8410 Pulmonary fibrosis, unspecified: Secondary | ICD-10-CM | POA: Diagnosis not present

## 2018-06-13 DIAGNOSIS — Z79899 Other long term (current) drug therapy: Secondary | ICD-10-CM | POA: Insufficient documentation

## 2018-06-13 DIAGNOSIS — J849 Interstitial pulmonary disease, unspecified: Secondary | ICD-10-CM

## 2018-06-13 DIAGNOSIS — Z951 Presence of aortocoronary bypass graft: Secondary | ICD-10-CM | POA: Diagnosis not present

## 2018-06-13 DIAGNOSIS — I4892 Unspecified atrial flutter: Secondary | ICD-10-CM | POA: Diagnosis not present

## 2018-06-13 DIAGNOSIS — Z8249 Family history of ischemic heart disease and other diseases of the circulatory system: Secondary | ICD-10-CM | POA: Insufficient documentation

## 2018-06-13 DIAGNOSIS — I4891 Unspecified atrial fibrillation: Secondary | ICD-10-CM | POA: Insufficient documentation

## 2018-06-13 DIAGNOSIS — Z7984 Long term (current) use of oral hypoglycemic drugs: Secondary | ICD-10-CM | POA: Diagnosis not present

## 2018-06-13 DIAGNOSIS — I5022 Chronic systolic (congestive) heart failure: Secondary | ICD-10-CM | POA: Diagnosis not present

## 2018-06-13 DIAGNOSIS — N183 Chronic kidney disease, stage 3 (moderate): Secondary | ICD-10-CM | POA: Insufficient documentation

## 2018-06-13 DIAGNOSIS — E785 Hyperlipidemia, unspecified: Secondary | ICD-10-CM | POA: Diagnosis not present

## 2018-06-13 DIAGNOSIS — I2723 Pulmonary hypertension due to lung diseases and hypoxia: Secondary | ICD-10-CM | POA: Insufficient documentation

## 2018-06-13 DIAGNOSIS — I130 Hypertensive heart and chronic kidney disease with heart failure and stage 1 through stage 4 chronic kidney disease, or unspecified chronic kidney disease: Secondary | ICD-10-CM | POA: Diagnosis not present

## 2018-06-13 DIAGNOSIS — G4733 Obstructive sleep apnea (adult) (pediatric): Secondary | ICD-10-CM | POA: Diagnosis not present

## 2018-06-13 DIAGNOSIS — I255 Ischemic cardiomyopathy: Secondary | ICD-10-CM | POA: Insufficient documentation

## 2018-06-13 DIAGNOSIS — I2510 Atherosclerotic heart disease of native coronary artery without angina pectoris: Secondary | ICD-10-CM | POA: Insufficient documentation

## 2018-06-13 DIAGNOSIS — Z7901 Long term (current) use of anticoagulants: Secondary | ICD-10-CM | POA: Insufficient documentation

## 2018-06-13 DIAGNOSIS — E1122 Type 2 diabetes mellitus with diabetic chronic kidney disease: Secondary | ICD-10-CM | POA: Insufficient documentation

## 2018-06-13 LAB — LIPID PANEL
Cholesterol: 142 mg/dL (ref 0–200)
HDL: 45 mg/dL (ref >40)
LDL Cholesterol: 27 mg/dL (ref 0–99)
Total CHOL/HDL Ratio: 3.2 RATIO
Triglycerides: 348 mg/dL — ABNORMAL HIGH (ref <150)
VLDL: 70 mg/dL — ABNORMAL HIGH (ref 0–40)

## 2018-06-13 LAB — BASIC METABOLIC PANEL WITH GFR
Anion gap: 11 (ref 5–15)
BUN: 34 mg/dL — ABNORMAL HIGH (ref 6–20)
CO2: 23 mmol/L (ref 22–32)
Calcium: 9 mg/dL (ref 8.9–10.3)
Chloride: 104 mmol/L (ref 98–111)
Creatinine, Ser: 1.61 mg/dL — ABNORMAL HIGH (ref 0.61–1.24)
GFR calc Af Amer: 55 mL/min — ABNORMAL LOW
GFR calc non Af Amer: 47 mL/min — ABNORMAL LOW
Glucose, Bld: 234 mg/dL — ABNORMAL HIGH (ref 70–99)
Potassium: 4.5 mmol/L (ref 3.5–5.1)
Sodium: 138 mmol/L (ref 135–145)

## 2018-06-13 LAB — DIGOXIN LEVEL: Digoxin Level: 0.6 ng/mL — ABNORMAL LOW (ref 0.8–2.0)

## 2018-06-13 MED ORDER — DAPAGLIFLOZIN PROPANEDIOL 10 MG PO TABS: 10.0000 mg | ORAL_TABLET | Freq: Every day | ORAL | 3 refills | Status: DC

## 2018-06-13 NOTE — Progress Notes (Signed)
PCP: Dr. Etter Sjogren Primary HF Cardiologist: Dr [name]  EP: Dr Caryl Comes   HPI: Mr Nelles is a 57 y.o. with history of A flutter, chronic systolic heart failure, CAD s/p CABG, CKD Stage III, DMII, OSA.   Admitted 08/07/17 with atrial flutter/RVR and acute on chronic systolic heart failure. Underwent successful DC/CV on 2/8. Required short term milrinone but was able to wean off. HF meds started. Echo this admission with EF 15%. D/C weight 201 pounds.  CPX in 2/19 showed severe functional impairment due to heart failure. However, PFTs were restrictive and high resolution CT chest was concerning for interstitial lung disease.   He has had atrial flutter ablation.   He had RHC in 4/19 showing preserved cardiac output and low filling pressures, but moderate pulmonary hypertension.   He saw pulmonary regarding interstitial lung disease and had bronchoscopy.  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  He has been started on prednisone, cough is much improved on prednisone.    He saw Dr. Caryl Comes, not planning to upgrade ICD to CRT given IVCD but not LBBB-like.   He returns today for followup of CHF.  Weight is up considerably, he thinks this is due to prednisone use (started for ILD by [name]).  He has had URI symptoms recently, sore throat and cough.  He took a course of azithomycin and is now feeling better.  Prior to his cold, no significant exertional dyspnea.  No orthopnea/PND. No chest pain.  Rare hemorrhoidal-type bleeding.  He uses Lasix only prn (rarely).   Medtronic device interrogation: thoracic impedence below threshold, no VT or AF.   Labs (2/19): free T4 increased but free T3 normal, LDL 86, HDL 30 Labs (3/19): K 4.9, creatinine 1.53, hgb 14.3, LDL 52, ANA negative, RF negative Labs (4/19): K 4.2, creatinine 1.27 Labs (5/19): LDL 69, K 4.4, creatinine 1.36 Labs (8/19): K 5.3, creatinine 2.2 Labs (10/19): K 5, creatinine 1.49 Labs (12/19): hgb 13.4  PMH:  1. Atrial  fibrillation: Noted post-op CABG.  2. Atrial flutter: Noted during 2/19 admission, DCCV done.  S/p Ablation 3/19.  3. CAD: s/p CABG.  - LHC (1/16):  Patent LIMA-LAD and SVG-D. 80% mLCx and 99% PLOM, total occlusion of SVG-PLOM.  Total occlusion of RCA with occlusion of SVG-RCA.  Patient had DES to mLCx-PLOM.  4. HTN 5. Type II diabetes  6. Hyperlipidemia 7. Chronic systolic CHF: Ischemic cardiomyopathy.   - Echo (2/19): EF 15%, mildly dilated LV, mild LVH, inferolateral akinesis, milr MR, mildly dilated RV with severely reduced systolic function.  - CPX (2/19): Peak VO2 12.2, VE/VCO2 slope 53, RER 1.07 => severe limitation from heart failure.  - [place] ICD  - RHC (4/19): mean RA 5, PA 54/18 mean 31, mean PCWP 12, CI 2.5, PVR 3.6 WU 8. Interstitial lung disease: PFTs (2/19) were restrictive, raising concern for ILD.  - High resolution CT chest in 2/19: interstitial lung disease concerning for usual interstitial pneumonitis. - Bronchoscopy was done, possible amiodarone lung toxicity causing ILD, now on prednisone.  9. CKD stage 3 10. OSA 11. ABIs (2/19): not significantly abnormal 12. Carotid dopplers (2/19): Mild disease only.  13. Pulmonary hypertension: ?Group 3 due to ILD.  14. Nephrolithiasis  Review of systems complete and found to be negative unless listed in HPI.   Social History   Socioeconomic History  . Marital status: Divorced    Spouse name: n/a  . Number of children: 0  . Years of education:  12th grade  . Highest education level: Not on file  Occupational History  . Occupation: Data processing manager ---self employed    Employer: [employer]  Social Needs  . Financial resource strain: Not on file  . Food insecurity:    Worry: Not on file    Inability: Not on file  . Transportation needs:    Medical: Not on file    Non-medical: Not on file  Tobacco Use  . Smoking status: Never Smoker  . Smokeless tobacco: Current User    Types:  Chew  Substance and Sexual Activity  . Alcohol use: Yes    Alcohol/week: 3.0 standard drinks    Types: 3 Glasses of wine per week  . Drug use: No  . Sexual activity: Not Currently    Partners: Female  Lifestyle  . Physical activity:    Days per week: Not on file    Minutes per session: Not on file  . Stress: Not on file  Relationships  . Social connections:    Talks on phone: Not on file    Gets together: Not on file    Attends religious service: Not on file    Active member of club or organization: Not on file    Attends meetings of clubs or organizations: Not on file    Relationship status: Not on file  . Intimate partner violence:    Fear of current or ex partner: Not on file    Emotionally abused: Not on file    Physically abused: Not on file    Forced sexual activity: Not on file  Other Topics Concern  . Not on file  Social History Narrative   Exercise-- walking    Lives alone.   Brother lives in [place], [place]    Family History  Problem Relation Age of Onset  . Hypertension Mother   . Diabetes Mother   . Hypertension Father   . Diabetes Father   . Hyperlipidemia Father     Current Outpatient Medications  Medication Sig Dispense Refill  . ACCU-CHEK FASTCLIX LANCETS MISC Use as directed once a day.  Dx code: E11.9 100 each 1  . albuterol (PROAIR HFA) 108 (90 Base) MCG/ACT inhaler Inhale 1-2 puffs into the lungs every 6 (six) hours as needed for wheezing or shortness of breath. 1 Inhaler 3  . apixaban (ELIQUIS) 5 MG TABS tablet Take 1 tablet (5 mg total) by mouth 2 (two) times daily. 180 tablet 3  . atorvastatin (LIPITOR) 40 MG tablet Take 1 tablet (40 mg total) by mouth at bedtime. 90 tablet 3  . blood glucose meter kit and supplies Dispense based on patient and insurance preference. Use as directed once a day. Dx Code E11.9. 1 each 0  . Blood Glucose Monitoring Suppl (ACCU-CHEK GUIDE) w/Device KIT 1 each by Does not apply route daily. DX Code: E11.9 1 kit 0  .  carvedilol (COREG) 25 MG tablet Take 1 tablet (25 mg total) by mouth 2 (two) times daily with a meal. 60 tablet 3  . digoxin (LANOXIN) 0.125 MG tablet Take 0.5 tablets (0.0625 mg total) by mouth daily. 15 tablet 5  . furosemide (LASIX) 40 MG tablet Take 1 tablet (40 mg total) by mouth daily as needed. 30 tablet 11  . glucose blood (ACCU-CHEK GUIDE) test strip Use as directed once a day.  Dx code: E11.9 100 each 1  . guaiFENesin (MUCINEX) 600 MG 12 hr tablet Take 1 tablet (600 mg total) by mouth 2 (  two) times daily as needed for to loosen phlegm.    Marland Kitchen HYDROcodone-homatropine (HYCODAN) 5-1.5 MG/5ML syrup Take 5 mLs by mouth every 6 (six) hours as needed for cough. 120 mL 0  . metFORMIN (GLUCOPHAGE) 500 MG tablet Take 1 tablet (500 mg total) by mouth 2 (two) times daily with a meal. 180 tablet 0  . mometasone-formoterol (DULERA) 200-5 MCG/ACT AERO Inhale 2 puffs into the lungs 2 (two) times daily. Further refills per PCP 1 Inhaler 0  . predniSONE (DELTASONE) 10 MG tablet Take 30mx2weeks,20mgx2weeks,then 154maily 180 tablet 1  . sacubitril-valsartan (ENTRESTO) 97-103 MG Take 1 tablet by mouth 2 (two) times daily. 60 tablet 11  . spironolactone (ALDACTONE) 25 MG tablet TAKE 1 TABLET BY MOUTH DAILY. 30 tablet 5  . dapagliflozin propanediol (FARXIGA) 10 MG TABS tablet Take 10 mg by mouth daily. 30 tablet 3  . nitroGLYCERIN (NITROSTAT) 0.4 MG SL tablet Place 1 tablet (0.4 mg total) under the tongue every 5 (five) minutes as needed for chest pain. (Patient not taking: Reported on 06/13/2018) 25 tablet 3   No current facility-administered medications for this encounter.     Vitals:   06/13/18 0956  BP: 110/62  Pulse: (!) 105  SpO2: 98%  Weight: 95.3 kg (210 lb 3.2 oz)    Wt Readings from Last 3 Encounters:  06/13/18 95.3 kg (210 lb 3.2 oz)  06/04/18 94.5 kg (208 lb 5.4 oz)  05/28/18 94.5 kg (208 lb 6.4 oz)     PHYSICAL EXAM: General: NAD Neck: Thick, no JVD, no thyromegaly or thyroid nodule.   Lungs: mild dry crackles at bases bilaterally.  CV: Nondisplaced PMI.  Heart regular S1/S2, no S3/S4, no murmur.  No peripheral edema.  No carotid bruit.  Normal pedal pulses.  Abdomen: Soft, nontender, no hepatosplenomegaly, no distention.  Skin: Intact without lesions or rashes.  Neurologic: Alert and oriented x 3.  Psych: Normal affect. Extremities: No clubbing or cyanosis.  HEENT: Normal.   ASSESSMENT & PLAN: 1. Chronic systolic CHF: Ischemic cardiomyopathy. Medtronic ICD single chamber. 2/19 admission for decompensated HF requiring milrinone. ECHO 2/7/019 EF 15% Mildly dilated RV. CPX in 2/19 was suggestive of severe limitation due to HF.  Currently NYHA class II symptoms, seems to be doing better than would be predicted from CPX and 2/19 admission.  RHC in 4/19 showed normal filling pressures and preserved cardiac output. He is not volume overloaded by exam or Optivol. I think his weight gain is likely related to prednisone use.  - Continue Coreg 25 mg bid.   - Continue digoxin 0.0625 mg daily, check level today.  - Continue Entresto 97/103 bid, BMET today.  - Continue spironolactone 25 mg daily.   - Has IVCD, not LBBB-like.  Saw Dr. KlCaryl Comeswill not upgrade ICD to CRT.  - I am going to start him on dapagliflozin 10 mg daily given CHF and diabetes.  - He may be a future LVAD candidate.   2. Atrial flutter: Paroxysmal.  Admitted in 2/19 with severe HF decompensation in setting of atrial flutter with RVR.  He was cardioverted.  He then had atrial flutter ablation in 3/19.  Regular on exam today, no recent atrial arrhythmias noted on device interrogation today. - He is now off amiodarone (question of lung toxicity, would not re-challenge in future).   - Continue Eliquis 5 mg bid.  3. Atrial fibrillation: Noted post-op CABG.  4. CAD: S/p CABG.  Last intervention was PCI to LCx in 2016. No chest pain.  -  Continue atorvastatin, check lipids today.  - No ASA with Eliquis use.  5. Pulmonary  fibrosis: PFTs in 2/19 were restrictive, and high resolution CT showed interstitial lung disease. Amiodarone was stopped due to concern for possible toxicity.  He has seen [name] => bronchoscopy suggestive of ILD related to amiodarone.  He is now on prednisone, this has helped his cough. - Needs followup with [name], will arrange.  With significant weight gain, think he needs to start tapering prednisone.  6. OSA: Uses CPAP. No change. 7. CKD: Stage 3. BMET today.  8. Pulmonary hypertension: Suspect group 3 PH due to lung disease (ILD).    Loralie Champagne 06/13/2018

## 2018-06-13 NOTE — Telephone Encounter (Signed)
Vincent Chandler  Dr Caryl Comes cardiology sent me a message in Oct 2019 saying patient no longer on our ILD clinic schedule. Pleas ehave patient do spiro/dlco and return to see me in ILD clinic first avail  Thanks    SIGNATURE    Dr. Brand Males, M.D., F.C.C.P,  Pulmonary and Critical Care Medicine Staff Physician, [place] Director - Interstitial Lung Disease  Program  Pulmonary [facility] [place], [place], [address]  Pager: [phone], If no answer or between  15:00h - 7:00h: call [phone] Telephone: [phone]  5:31 AM 06/13/2018

## 2018-06-13 NOTE — Telephone Encounter (Signed)
Attempted to call pt to see if we could get him scheduled for an appt with MR for 21mn OV in ILD clinic with 340m PFT prior but unable to reach pt and unable to leave a VM due to mailbox not being set up.  When pt calls back, please schedule pt first avail appt with MR in ILD clinic ([id number]) with 34m36mFT before. If it does not work out for the PFT to be done same day as OV, if pt does not mind to come a different day to have PFT performed before OV, please schedule it that way.  PFT just needs to be done before pt sees MR.

## 2018-06-13 NOTE — Patient Instructions (Addendum)
Routine lab work today. Will notify you of abnormal results  We have referred you to [facility] his office will contact you to schedule your appointment.   Dr.McLean plans to start you on Farxiga 56m our pharmacist EDoroteo Bradfordwill contact you regarding this medication.  Follow up and echo in 2 months with Dr. MAundra Dubin

## 2018-06-14 ENCOUNTER — Telehealth (HOSPITAL_COMMUNITY): Payer: Self-pay

## 2018-06-14 DIAGNOSIS — R69 Illness, unspecified: Secondary | ICD-10-CM | POA: Diagnosis not present

## 2018-06-14 MED ORDER — ICOSAPENT ETHYL 1 G PO CAPS: 2.0000 g | ORAL_CAPSULE | Freq: Two times a day (BID) | ORAL | 6 refills | Status: DC

## 2018-06-14 NOTE — Telephone Encounter (Signed)
Pt called with lab results Very high triglycerides.  Would have him start vascepa 2 g bid script sent to [facility]

## 2018-06-17 MED FILL — SPIRONOLACTONE 25 MG TABLET: 25 | 30 days supply | Qty: 30 | Fill #3

## 2018-06-17 MED FILL — metFORMIN HCL 500 MG TABS: 500 | 30 days supply | Qty: 60 | Fill #2

## 2018-06-17 NOTE — Telephone Encounter (Signed)
Attempted to call pt but unable to reach pt and unable to leave a VM due to mailbox not being set up yet. Will try to call again later.

## 2018-06-18 ENCOUNTER — Encounter: Payer: Medicare HMO | Admitting: *Deleted

## 2018-06-18 VITALS — BP 110/62 | HR 98 | Ht 70.0 in | Wt 210.0 lb

## 2018-06-18 DIAGNOSIS — Z006 Encounter for examination for normal comparison and control in clinical research program: Secondary | ICD-10-CM

## 2018-06-19 ENCOUNTER — Telehealth: Payer: Self-pay

## 2018-06-19 NOTE — Telephone Encounter (Signed)
Spoke with pt and reminded pt of remote transmission that is due today. Pt verbalized understanding.   

## 2018-06-20 ENCOUNTER — Telehealth: Payer: Self-pay | Admitting: *Deleted

## 2018-06-20 ENCOUNTER — Encounter: Payer: Self-pay | Admitting: Cardiology

## 2018-06-20 ENCOUNTER — Other Ambulatory Visit: Payer: Self-pay | Admitting: *Deleted

## 2018-06-20 DIAGNOSIS — Z006 Encounter for examination for normal comparison and control in clinical research program: Secondary | ICD-10-CM

## 2018-06-20 NOTE — Research (Signed)
Pt here today for screening of BeAT HF research study.  6MW was completed and 396 m BNP for central lab was drawn and sent off. NYHA = 3 AHA/ACC = C  All questions for study were answered.  Pt was excited to get the process started.     Current Outpatient Medications:  .  ACCU-CHEK FASTCLIX LANCETS MISC, Use as directed once a day.  Dx code: E11.9, Disp: 100 each, Rfl: 1 .  albuterol (PROAIR HFA) 108 (90 Base) MCG/ACT inhaler, Inhale 1-2 puffs into the lungs every 6 (six) hours as needed for wheezing or shortness of breath., Disp: 1 Inhaler, Rfl: 3 .  apixaban (ELIQUIS) 5 MG TABS tablet, Take 1 tablet (5 mg total) by mouth 2 (two) times daily., Disp: 180 tablet, Rfl: 3 .  atorvastatin (LIPITOR) 40 MG tablet, Take 1 tablet (40 mg total) by mouth at bedtime., Disp: 90 tablet, Rfl: 3 .  blood glucose meter kit and supplies, Dispense based on patient and insurance preference. Use as directed once a day. Dx Code E11.9., Disp: 1 each, Rfl: 0 .  Blood Glucose Monitoring Suppl (ACCU-CHEK GUIDE) w/Device KIT, 1 each by Does not apply route daily. DX Code: E11.9, Disp: 1 kit, Rfl: 0 .  carvedilol (COREG) 25 MG tablet, Take 1 tablet (25 mg total) by mouth 2 (two) times daily with a meal., Disp: 60 tablet, Rfl: 3 .  dapagliflozin propanediol (FARXIGA) 10 MG TABS tablet, Take 10 mg by mouth daily., Disp: 30 tablet, Rfl: 3 .  digoxin (LANOXIN) 0.125 MG tablet, Take 0.5 tablets (0.0625 mg total) by mouth daily., Disp: 15 tablet, Rfl: 5 .  furosemide (LASIX) 40 MG tablet, Take 1 tablet (40 mg total) by mouth daily as needed., Disp: 30 tablet, Rfl: 11 .  glucose blood (ACCU-CHEK GUIDE) test strip, Use as directed once a day.  Dx code: E11.9, Disp: 100 each, Rfl: 1 .  guaiFENesin (MUCINEX) 600 MG 12 hr tablet, Take 1 tablet (600 mg total) by mouth 2 (two) times daily as needed for to loosen phlegm., Disp: , Rfl:  .  HYDROcodone-homatropine (HYCODAN) 5-1.5 MG/5ML syrup, Take 5 mLs by mouth every 6 (six) hours as  needed for cough., Disp: 120 mL, Rfl: 0 .  Icosapent Ethyl (VASCEPA) 1 g CAPS, Take 2 capsules (2 g total) by mouth 2 (two) times daily., Disp: 120 capsule, Rfl: 6 .  metFORMIN (GLUCOPHAGE) 500 MG tablet, Take 1 tablet (500 mg total) by mouth 2 (two) times daily with a meal., Disp: 180 tablet, Rfl: 0 .  mometasone-formoterol (DULERA) 200-5 MCG/ACT AERO, Inhale 2 puffs into the lungs 2 (two) times daily. Further refills per PCP, Disp: 1 Inhaler, Rfl: 0 .  nitroGLYCERIN (NITROSTAT) 0.4 MG SL tablet, Place 1 tablet (0.4 mg total) under the tongue every 5 (five) minutes as needed for chest pain. (Patient not taking: Reported on 06/13/2018), Disp: 25 tablet, Rfl: 3 .  predniSONE (DELTASONE) 10 MG tablet, Take 70mx2weeks,20mgx2weeks,then 14maily, Disp: 180 tablet, Rfl: 1 .  sacubitril-valsartan (ENTRESTO) 97-103 MG, Take 1 tablet by mouth 2 (two) times daily., Disp: 60 tablet, Rfl: 11 .  spironolactone (ALDACTONE) 25 MG tablet, TAKE 1 TABLET BY MOUTH DAILY., Disp: 30 tablet, Rfl: 5

## 2018-06-20 NOTE — Research (Signed)
BeAT HF Informed Consent    Subject met inclusion and exclusion criteria.  The informed consent form, study requirements and expectations were reviewed with the subject and questions and concerns were addressed prior to the signing of the consent form.  The subject verbalized understanding of the trial requirements.  The subject agreed to participate in the BeAT HF trial and signed the informed consent at 06/18/18 on 1040am.  The informed consent was obtained prior to performance of any protocol-specific procedures for the subject.  A copy of the signed informed consent was given to the subject and a copy was placed in the subject's medical record.   Philemon Kingdom D 06/18/18 @ 1040 am

## 2018-06-20 NOTE — Telephone Encounter (Signed)
Informed pt of results of labs form BEAT HF research study.  Pt scheduled for EKG, ECHO and Carotid dopplers 07/08/2018.

## 2018-06-21 ENCOUNTER — Encounter: Payer: Self-pay | Admitting: *Deleted

## 2018-06-21 NOTE — Addendum Note (Signed)
Addended by: Lorretta Harp on: 06/21/2018 04:25 PM   Modules accepted: Orders

## 2018-06-21 NOTE — Telephone Encounter (Signed)
Called and was finally able to speak with pt. Stated to him that we were needing him to come in for an OV with PFT prior. Pt expressed understanding. appts have been scheduled for pt with PFT at 4pm 07/23/18 and OV with MR after at 4:30. Nothing further needed.

## 2018-07-05 ENCOUNTER — Telehealth (HOSPITAL_COMMUNITY): Payer: Self-pay | Admitting: Surgery

## 2018-07-05 NOTE — Telephone Encounter (Signed)
Attempted to contact patient to confirm appointment at [facility] vascular lab on 07/08/2018 [facility]

## 2018-07-08 ENCOUNTER — Encounter: Payer: Medicare HMO | Admitting: *Deleted

## 2018-07-08 ENCOUNTER — Ambulatory Visit (HOSPITAL_COMMUNITY)
Admission: RE | Admit: 2018-07-08 | Discharge: 2018-07-08 | Disposition: A | Discharge status: Home / Self Care | Payer: Self-pay | Source: Ambulatory Visit | Attending: Family Medicine | Admitting: Family Medicine

## 2018-07-08 ENCOUNTER — Ambulatory Visit (HOSPITAL_BASED_OUTPATIENT_CLINIC_OR_DEPARTMENT_OTHER)
Admission: RE | Admit: 2018-07-08 | Discharge: 2018-07-08 | Disposition: A | Discharge status: Home / Self Care | Payer: Self-pay | Source: Ambulatory Visit | Attending: Internal Medicine | Admitting: Internal Medicine

## 2018-07-08 DIAGNOSIS — Z006 Encounter for examination for normal comparison and control in clinical research program: Secondary | ICD-10-CM

## 2018-07-08 MED FILL — ATORVASTATIN 40 MG TABLET: 40 | 30 days supply | Qty: 30 | Fill #3

## 2018-07-08 MED FILL — DIGOXIN 0.125 MG TABLET: 125 | 30 days supply | Qty: 15 | Fill #4

## 2018-07-08 NOTE — Research (Signed)
EKG done for BEAT HF screening

## 2018-07-08 NOTE — Progress Notes (Signed)
  Echocardiogram 2D Echocardiogram has been performed.  Jennette Dubin 07/08/2018, 2:59 PM

## 2018-07-10 ENCOUNTER — Encounter: Payer: Medicare HMO | Admitting: *Deleted

## 2018-07-10 VITALS — BP 78/52 | HR 87 | Wt 212.2 lb

## 2018-07-10 DIAGNOSIS — Z006 Encounter for examination for normal comparison and control in clinical research program: Secondary | ICD-10-CM

## 2018-07-10 DIAGNOSIS — I5022 Chronic systolic (congestive) heart failure: Secondary | ICD-10-CM

## 2018-07-10 MED ORDER — SACUBITRIL-VALSARTAN 49-51 MG PO TABS: 1.0000 | ORAL_TABLET | Freq: Two times a day (BID) | ORAL | Status: DC

## 2018-07-10 MED ORDER — SACUBITRIL-VALSARTAN 49-51 MG PO TABS: 1.0000 | ORAL_TABLET | Freq: Two times a day (BID) | ORAL | 3 refills | Status: DC

## 2018-07-10 MED FILL — predniSONE 10 MG TABS: 10 | 30 days supply | Qty: 30 | Fill #4

## 2018-07-10 NOTE — Progress Notes (Signed)
Patient aware.

## 2018-07-10 NOTE — Patient Instructions (Signed)
Do Not take any Entresto or carvedilol tonight 07/10/2018. Start Entresto 49-51 mg in the morning 07/11/2018 Come back 07/11/2018 @ 1130 for BP check

## 2018-07-10 NOTE — Research (Addendum)
Pt came into see Dr Trula Slade.  Pt BP was on the low side, decreased his Entresto to 49-51 mg bid per Dr [name], checked his blood sugar it was 227. EKG was completed. CMP and CBC drawn per Dr [name]. Told pt to hold his Entresto and Carvedilol tonight and then start Entresto 49-51 mg tomorrow 07/11/2018.  Pt is going to come in the am to get his BP checked again.                                       [facility]  Patient name: Vincent Chandler MRN: [id number] DOB: January 06, 1961 Sex: male   REQUESTING PROVIDER:    Dr. [name]   REASON FOR CONSULT:    Barostim eval  HISTORY OF PRESENT ILLNESS:   Vincent WILLHITE is a 58 y.o. male, who is being considered for the Barostim trial.  The patient has interstitial lung disease.  He suffers from coronary artery disease, status post CABG and PCI.  He has an ICD in place on the left side.  He is on Eliquis.  He is medically managed for hypertension and hyperlipidemia.  He is a diabetic and has a history of atrial fibrillation.  He is a non-smoker.  PAST MEDICAL HISTORY    Past Medical History:  Diagnosis Date  . AICD (automatic cardioverter/defibrillator) present   . Anginal pain ([place])   . Anxiety   . Asthma   . Atrial fibrillation-postoperative 11/28/2012  . Cardiomyopathy    alcohol use related  . CHF (congestive heart failure) ([place])   . Chronic back pain   . Coronary artery disease    drug eluting stent RCA 2005-EF 30%- s/p CABG x 4; 2/4 patent grafts with SVG-PLOM and SVG-RCA system totally occluded. There are collaterals from the LAD system to the RCA and the RCA is totally occluded proximally. There are no collaterals to the LCx territory. He then underwent successful PCI of the mid left circumflex artery with overlapping Synergy drug-eluting stents  . Depression   . Diabetes mellitus type II dx'd in the 1990's  . GERD (gastroesophageal reflux disease)    yrs ago  . H/O hiatal hernia   . History of  hypogonadism   . History of kidney stones   . Hyperlipidemia   . Hypertension   . OSA on CPAP    "mask is broken; working on getting a new one" (01/15/2015; 10/03/2017)  . Pneumonia    "3-4 times" (10/03/2017)  . Urinary incontinence      FAMILY HISTORY   Family History  Problem Relation Age of Onset  . Hypertension Mother   . Diabetes Mother   . Hypertension Father   . Diabetes Father   . Hyperlipidemia Father     SOCIAL HISTORY:   Social History   Socioeconomic History  . Marital status: Divorced    Spouse name: n/a  . Number of children: 0  . Years of education: 12th grade  . Highest education level: Not on file  Occupational History  . Occupation: Data processing manager ---self employed    Employer: [employer]  Social Needs  . Financial resource strain: Not on file  . Food insecurity:    Worry: Not on file    Inability: Not on file  . Transportation needs:    Medical: Not on file    Non-medical: Not on file  Tobacco Use  . Smoking status: Never Smoker  . Smokeless tobacco: Current User    Types: Chew  Substance and Sexual Activity  . Alcohol use: Yes    Alcohol/week: 3.0 standard drinks    Types: 3 Glasses of wine per week  . Drug use: No  . Sexual activity: Not Currently    Partners: Female  Lifestyle  . Physical activity:    Days per week: Not on file    Minutes per session: Not on file  . Stress: Not on file  Relationships  . Social connections:    Talks on phone: Not on file    Gets together: Not on file    Attends religious service: Not on file    Active member of club or organization: Not on file    Attends meetings of clubs or organizations: Not on file    Relationship status: Not on file  . Intimate partner violence:    Fear of current or ex partner: Not on file    Emotionally abused: Not on file    Physically abused: Not on file    Forced sexual activity: Not on file  Other Topics Concern  . Not on file  Social  History Narrative   Exercise-- walking    Lives alone.   Brother lives in [place], [place]:    No Known Allergies  CURRENT MEDICATIONS:    Current Outpatient Medications  Medication Sig Dispense Refill  . ACCU-CHEK FASTCLIX LANCETS MISC Use as directed once a day.  Dx code: E11.9 100 each 1  . albuterol (PROAIR HFA) 108 (90 Base) MCG/ACT inhaler Inhale 1-2 puffs into the lungs every 6 (six) hours as needed for wheezing or shortness of breath. 1 Inhaler 3  . apixaban (ELIQUIS) 5 MG TABS tablet Take 1 tablet (5 mg total) by mouth 2 (two) times daily. 180 tablet 3  . atorvastatin (LIPITOR) 40 MG tablet Take 1 tablet (40 mg total) by mouth at bedtime. 90 tablet 3  . blood glucose meter kit and supplies Dispense based on patient and insurance preference. Use as directed once a day. Dx Code E11.9. 1 each 0  . Blood Glucose Monitoring Suppl (ACCU-CHEK GUIDE) w/Device KIT 1 each by Does not apply route daily. DX Code: E11.9 1 kit 0  . carvedilol (COREG) 25 MG tablet Take 1 tablet (25 mg total) by mouth 2 (two) times daily with a meal. 60 tablet 3  . dapagliflozin propanediol (FARXIGA) 10 MG TABS tablet Take 10 mg by mouth daily. 30 tablet 3  . digoxin (LANOXIN) 0.125 MG tablet Take 0.5 tablets (0.0625 mg total) by mouth daily. 15 tablet 5  . furosemide (LASIX) 40 MG tablet Take 1 tablet (40 mg total) by mouth daily as needed. 30 tablet 11  . glucose blood (ACCU-CHEK GUIDE) test strip Use as directed once a day.  Dx code: E11.9 100 each 1  . guaiFENesin (MUCINEX) 600 MG 12 hr tablet Take 1 tablet (600 mg total) by mouth 2 (two) times daily as needed for to loosen phlegm.    Marland Kitchen HYDROcodone-homatropine (HYCODAN) 5-1.5 MG/5ML syrup Take 5 mLs by mouth every 6 (six) hours as needed for cough. 120 mL 0  . Icosapent Ethyl (VASCEPA) 1 g CAPS Take 2 capsules (2 g total) by mouth 2 (two) times daily. 120 capsule 6  . metFORMIN (GLUCOPHAGE) 500 MG tablet Take 1 tablet (500 mg total) by mouth 2  (two) times daily with a meal. 180 tablet  0  . mometasone-formoterol (DULERA) 200-5 MCG/ACT AERO Inhale 2 puffs into the lungs 2 (two) times daily. Further refills per PCP 1 Inhaler 0  . nitroGLYCERIN (NITROSTAT) 0.4 MG SL tablet Place 1 tablet (0.4 mg total) under the tongue every 5 (five) minutes as needed for chest pain. 25 tablet 3  . predniSONE (DELTASONE) 10 MG tablet Take 69mx2weeks,20mgx2weeks,then 170maily 180 tablet 1  . spironolactone (ALDACTONE) 25 MG tablet TAKE 1 TABLET BY MOUTH DAILY. 30 tablet 5  . [START ON 07/11/2018] sacubitril-valsartan (ENTRESTO) 49-51 MG Take 1 tablet by mouth 2 (two) times daily. 60 tablet 3   No current facility-administered medications for this visit.     REVIEW OF SYSTEMS:   _0  denotes positive finding, _1  denotes negative finding Cardiac  Comments:  Chest pain or chest pressure:    Shortness of breath upon exertion: x   Short of breath when lying flat: x   Irregular heart rhythm:        Vascular    Pain in calf, thigh, or hip brought on by ambulation:    Pain in feet at night that wakes you up from your sleep:     Blood clot in your veins:    Leg swelling:         Pulmonary    Oxygen at home:    Productive cough:     Wheezing:  x       Neurologic    Sudden weakness in arms or legs:     Sudden numbness in arms or legs:     Sudden onset of difficulty speaking or slurred speech:    Temporary loss of vision in one eye:     Problems with dizziness:         Gastrointestinal    Blood in stool:      Vomited blood:         Genitourinary    Burning when urinating:     Blood in urine:        Psychiatric    Major depression:         Hematologic    Bleeding problems:    Problems with blood clotting too easily:        Skin    Rashes or ulcers:        Constitutional    Fever or chills:     PHYSICAL EXAM:   Vitals:   07/10/18 1633 07/10/18 1645  BP: (!) 84/59 (!) 78/52  Pulse: 87   SpO2: 98%   Weight: 212 lb 3.2 oz (96.3  kg)     GENERAL: The patient is a well-nourished male, in no acute distress. The vital signs are documented above. CARDIAC: There is a regular rate and rhythm.  VASCULAR: I used the ultrasound to evaluate his carotid bifurcation which is in the mid neck.  There is no significant calcification. PULMONARY: Nonlabored respirations MUSCULOSKELETAL: There are no major deformities or cyanosis. NEUROLOGIC: No focal weakness or paresthesias are detected. SKIN: There are no ulcers or rashes noted. PSYCHIATRIC: The patient has a normal affect.  STUDIES:   I have reviewed his ultrasound which reveals 1-39% bilateral carotid stenosis.  The bifurcation is mid neck.  ASSESSMENT and PLAN   I have evaluated the patient for the Barostim trial.  He meets surgical criteria to proceed.  I discussed the details of the operation with the patient including the incisions the recovery.  And the risk of infection.  All of his questions were answered.  He will be randomized tomorrow.   Annamarie Major, MD [facility] [phone] Pager [phone]

## 2018-07-11 ENCOUNTER — Telehealth (HOSPITAL_COMMUNITY): Payer: Self-pay

## 2018-07-11 DIAGNOSIS — I5022 Chronic systolic (congestive) heart failure: Secondary | ICD-10-CM

## 2018-07-11 LAB — CBC WITH DIFFERENTIAL/PLATELET
Basophils Absolute: 0 10*3/uL (ref 0.0–0.2)
Basos: 0 %
EOS (ABSOLUTE): 0.2 10*3/uL (ref 0.0–0.4)
Eos: 3 %
Hematocrit: 38.5 % (ref 37.5–51.0)
Hemoglobin: 13.3 g/dL (ref 13.0–17.7)
Immature Grans (Abs): 0 10*3/uL (ref 0.0–0.1)
Immature Granulocytes: 1 %
Lymphocytes Absolute: 1.4 10*3/uL (ref 0.7–3.1)
Lymphs: 25 %
MCH: 38.9 pg — ABNORMAL HIGH (ref 26.6–33.0)
MCHC: 34.5 g/dL (ref 31.5–35.7)
MCV: 113 fL — ABNORMAL HIGH (ref 79–97)
Monocytes Absolute: 0.5 10*3/uL (ref 0.1–0.9)
Monocytes: 9 %
NRBC: 1 % — ABNORMAL HIGH (ref 0–0)
Neutrophils Absolute: 3.4 10*3/uL (ref 1.4–7.0)
Neutrophils: 62 %
Platelets: 155 10*3/uL (ref 150–450)
RBC: 3.42 x10E6/uL — AB (ref 4.14–5.80)
RDW: 14 % (ref 11.6–15.4)
WBC: 5.5 10*3/uL (ref 3.4–10.8)

## 2018-07-11 LAB — COMPREHENSIVE METABOLIC PANEL
A/G RATIO: 1.6 (ref 1.2–2.2)
ALT: 15 IU/L (ref 0–44)
AST: 12 IU/L (ref 0–40)
Albumin: 3.8 g/dL (ref 3.5–5.5)
Alkaline Phosphatase: 55 IU/L (ref 39–117)
BILIRUBIN TOTAL: 0.3 mg/dL (ref 0.0–1.2)
BUN/Creatinine Ratio: 17 (ref 9–20)
BUN: 30 mg/dL — ABNORMAL HIGH (ref 6–24)
CHLORIDE: 102 mmol/L (ref 96–106)
CO2: 19 mmol/L — ABNORMAL LOW (ref 20–29)
Calcium: 8.8 mg/dL (ref 8.7–10.2)
Creatinine, Ser: 1.8 mg/dL — ABNORMAL HIGH (ref 0.76–1.27)
GFR calc Af Amer: 48 mL/min/{1.73_m2} — ABNORMAL LOW (ref >59)
GFR calc non Af Amer: 41 mL/min/{1.73_m2} — ABNORMAL LOW (ref >59)
Globulin, Total: 2.4 g/dL (ref 1.5–4.5)
Glucose: 240 mg/dL — ABNORMAL HIGH (ref 65–99)
POTASSIUM: 4.9 mmol/L (ref 3.5–5.2)
Sodium: 138 mmol/L (ref 134–144)
Total Protein: 6.2 g/dL (ref 6.0–8.5)

## 2018-07-11 NOTE — Telephone Encounter (Signed)
Pt called lab work scheduled for Monday at 12:30

## 2018-07-15 ENCOUNTER — Encounter: Payer: Medicare HMO | Admitting: *Deleted

## 2018-07-15 ENCOUNTER — Ambulatory Visit (HOSPITAL_COMMUNITY)
Admission: RE | Admit: 2018-07-15 | Discharge: 2018-07-15 | Disposition: A | Discharge status: Home / Self Care | Payer: Medicare HMO | Source: Ambulatory Visit | Attending: Internal Medicine | Admitting: Internal Medicine

## 2018-07-15 VITALS — BP 90/50 | HR 94 | Wt 211.8 lb

## 2018-07-15 DIAGNOSIS — Z006 Encounter for examination for normal comparison and control in clinical research program: Secondary | ICD-10-CM

## 2018-07-15 DIAGNOSIS — I5022 Chronic systolic (congestive) heart failure: Secondary | ICD-10-CM

## 2018-07-15 LAB — BASIC METABOLIC PANEL
ANION GAP: 10 (ref 5–15)
BUN: 31 mg/dL — ABNORMAL HIGH (ref 6–20)
CO2: 24 mmol/L (ref 22–32)
Calcium: 9.1 mg/dL (ref 8.9–10.3)
Chloride: 103 mmol/L (ref 98–111)
Creatinine, Ser: 1.61 mg/dL — ABNORMAL HIGH (ref 0.61–1.24)
GFR calc Af Amer: 55 mL/min — ABNORMAL LOW (ref >60)
GFR calc non Af Amer: 47 mL/min — ABNORMAL LOW (ref >60)
Glucose, Bld: 224 mg/dL — ABNORMAL HIGH (ref 70–99)
Potassium: 5 mmol/L (ref 3.5–5.1)
Sodium: 137 mmol/L (ref 135–145)

## 2018-07-15 NOTE — Research (Signed)
Pt came in today for baseline appt for BeAT HF.  Pt feeling weak,and dizzy, vitals checked BP low. We decreased his Entresto to 49-51 last week. Pt also stopped his [place].  Per Dr [name] we will stop his Delene Loll today and he will see PA in clinic on Wednesday.  We will hold on screening pt until Bp stable and pt feels better.

## 2018-07-15 NOTE — Patient Instructions (Signed)
Stop Entresto  See NP on Wednesday 07/17/2018 at 9 am

## 2018-07-16 ENCOUNTER — Telehealth (HOSPITAL_COMMUNITY): Payer: Self-pay

## 2018-07-16 MED FILL — CARVEDILOL 25 MG TABLET: 25 | 30 days supply | Qty: 60 | Fill #3

## 2018-07-16 MED FILL — SPIRONOLACTONE 25 MG TABLET: 25 | 30 days supply | Qty: 30 | Fill #0

## 2018-07-16 NOTE — Telephone Encounter (Signed)
Pt  Called with lab results Creatinine better.  K is high, stop any K supplement and follow low K diet. Pt given list of food high and low in K, informed pt to look on line as well to see foods low in K. Pt verbalized understanding

## 2018-07-17 ENCOUNTER — Ambulatory Visit (HOSPITAL_COMMUNITY)
Admission: RE | Admit: 2018-07-17 | Discharge: 2018-07-17 | Disposition: A | Discharge status: Home / Self Care | Payer: Medicare HMO | Source: Ambulatory Visit | Attending: Cardiology | Admitting: Cardiology

## 2018-07-18 ENCOUNTER — Ambulatory Visit (HOSPITAL_COMMUNITY)
Admission: RE | Admit: 2018-07-18 | Discharge: 2018-07-18 | Disposition: A | Discharge status: Home / Self Care | Payer: Medicare HMO | Source: Ambulatory Visit | Attending: Cardiology | Admitting: Cardiology

## 2018-07-18 ENCOUNTER — Encounter (HOSPITAL_COMMUNITY): Payer: Self-pay

## 2018-07-18 VITALS — BP 158/98 | Wt 213.0 lb

## 2018-07-18 DIAGNOSIS — J849 Interstitial pulmonary disease, unspecified: Secondary | ICD-10-CM

## 2018-07-18 DIAGNOSIS — G4733 Obstructive sleep apnea (adult) (pediatric): Secondary | ICD-10-CM | POA: Insufficient documentation

## 2018-07-18 DIAGNOSIS — I130 Hypertensive heart and chronic kidney disease with heart failure and stage 1 through stage 4 chronic kidney disease, or unspecified chronic kidney disease: Secondary | ICD-10-CM | POA: Insufficient documentation

## 2018-07-18 DIAGNOSIS — I480 Paroxysmal atrial fibrillation: Secondary | ICD-10-CM | POA: Diagnosis not present

## 2018-07-18 DIAGNOSIS — J8410 Pulmonary fibrosis, unspecified: Secondary | ICD-10-CM | POA: Diagnosis not present

## 2018-07-18 DIAGNOSIS — E785 Hyperlipidemia, unspecified: Secondary | ICD-10-CM | POA: Diagnosis not present

## 2018-07-18 DIAGNOSIS — I2723 Pulmonary hypertension due to lung diseases and hypoxia: Secondary | ICD-10-CM | POA: Diagnosis not present

## 2018-07-18 DIAGNOSIS — N183 Chronic kidney disease, stage 3 unspecified: Secondary | ICD-10-CM

## 2018-07-18 DIAGNOSIS — I10 Essential (primary) hypertension: Secondary | ICD-10-CM | POA: Diagnosis not present

## 2018-07-18 DIAGNOSIS — I4892 Unspecified atrial flutter: Secondary | ICD-10-CM

## 2018-07-18 DIAGNOSIS — I5022 Chronic systolic (congestive) heart failure: Secondary | ICD-10-CM | POA: Insufficient documentation

## 2018-07-18 DIAGNOSIS — Z951 Presence of aortocoronary bypass graft: Secondary | ICD-10-CM | POA: Insufficient documentation

## 2018-07-18 DIAGNOSIS — Z7984 Long term (current) use of oral hypoglycemic drugs: Secondary | ICD-10-CM | POA: Insufficient documentation

## 2018-07-18 DIAGNOSIS — Z79899 Other long term (current) drug therapy: Secondary | ICD-10-CM | POA: Diagnosis not present

## 2018-07-18 DIAGNOSIS — E1122 Type 2 diabetes mellitus with diabetic chronic kidney disease: Secondary | ICD-10-CM | POA: Insufficient documentation

## 2018-07-18 DIAGNOSIS — I2510 Atherosclerotic heart disease of native coronary artery without angina pectoris: Secondary | ICD-10-CM | POA: Insufficient documentation

## 2018-07-18 DIAGNOSIS — Z833 Family history of diabetes mellitus: Secondary | ICD-10-CM | POA: Insufficient documentation

## 2018-07-18 DIAGNOSIS — Z7901 Long term (current) use of anticoagulants: Secondary | ICD-10-CM | POA: Insufficient documentation

## 2018-07-18 DIAGNOSIS — I255 Ischemic cardiomyopathy: Secondary | ICD-10-CM | POA: Diagnosis not present

## 2018-07-18 DIAGNOSIS — Z8249 Family history of ischemic heart disease and other diseases of the circulatory system: Secondary | ICD-10-CM | POA: Diagnosis not present

## 2018-07-18 NOTE — Patient Instructions (Addendum)
STOP TAKING Farxiga.  START TAKING Entreso 49/51 mg by mouth twice daily.  Lab appt in 10 days.

## 2018-07-18 NOTE — Progress Notes (Signed)
PCP: Dr. Etter Sjogren Primary HF Cardiologist: Dr [name]  EP: Dr Caryl Comes   HPI: Vincent Chandler is a 58 y.o. male with history of A flutter, chronic systolic heart failure, CAD s/p CABG, CKD Stage III, DMII, OSA.   Admitted 08/07/17 with atrial flutter/RVR and acute on chronic systolic heart failure. Underwent successful DC/CV on 2/8. Required short term milrinone but was able to wean off. HF meds started. Echo this admission with EF 15%. D/C weight 201 pounds.  CPX in 2/19 showed severe functional impairment due to heart failure. However, PFTs were restrictive and high resolution CT chest was concerning for interstitial lung disease.   He has had atrial flutter ablation.   He had RHC in 4/19 showing preserved cardiac output and low filling pressures, but moderate pulmonary hypertension.   He saw pulmonary regarding interstitial lung disease and had bronchoscopy.  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  He has been started on prednisone, cough is much improved on prednisone.    He saw Dr. Caryl Comes, not planning to upgrade ICD to CRT given IVCD but not LBBB-like.   He presents today for add on for BP check. Seen in research 1/8 and 1/13 and Entresto stopped. Feeling much better today. BP running high, checked multiple times. No further dizziness or lightheadedness. No peripheral edema, orthopnea, PND, or CP. Has not used any lasix. Remains off [place].    Medtronic device interrogation: No VT/VF. Thoracic impedence with recent downtrend, but now normalized. Fluid index slightly elevated, but no recent crossings. Pt activity ~ 3 hrs daily. Personally reviewed.   Echo 07/08/2018 LVEF 20%, Mild LAE  Labs (2/19): free T4 increased but free T3 normal, LDL 86, HDL 30 Labs (3/19): K 4.9, creatinine 1.53, hgb 14.3, LDL 52, ANA negative, RF negative Labs (4/19): K 4.2, creatinine 1.27 Labs (5/19): LDL 69, K 4.4, creatinine 1.36 Labs (8/19): K 5.3, creatinine 2.2 Labs (10/19): K 5, creatinine  1.49 Labs (12/19): hgb 13.4  PMH:  1. Atrial fibrillation: Noted post-op CABG.  2. Atrial flutter: Noted during 2/19 admission, DCCV done.  S/p Ablation 3/19.  3. CAD: s/p CABG.  - LHC (1/16):  Patent LIMA-LAD and SVG-D. 80% mLCx and 99% PLOM, total occlusion of SVG-PLOM.  Total occlusion of RCA with occlusion of SVG-RCA.  Patient had DES to mLCx-PLOM.  4. HTN 5. Type II diabetes  6. Hyperlipidemia 7. Chronic systolic CHF: Ischemic cardiomyopathy.   - Echo (2/19): EF 15%, mildly dilated LV, mild LVH, inferolateral akinesis, milr MR, mildly dilated RV with severely reduced systolic function.  - CPX (2/19): Peak VO2 12.2, VE/VCO2 slope 53, RER 1.07 => severe limitation from heart failure.  - Electra ICD  - RHC (4/19): mean RA 5, PA 54/18 mean 31, mean PCWP 12, CI 2.5, PVR 3.6 WU 8. Interstitial lung disease: PFTs (2/19) were restrictive, raising concern for ILD.  - High resolution CT chest in 2/19: interstitial lung disease concerning for usual interstitial pneumonitis. - Bronchoscopy was done, possible amiodarone lung toxicity causing ILD, now on prednisone.  9. CKD stage 3 10. OSA 11. ABIs (2/19): not significantly abnormal 12. Carotid dopplers (2/19): Mild disease only.  13. Pulmonary hypertension: ?Group 3 due to ILD.  14. Nephrolithiasis  Review of systems complete and found to be negative unless listed in HPI.    Social History   Socioeconomic History  . Marital status: Divorced    Spouse name: n/a  . Number of children: 0  . Years  of education: 12th grade  . Highest education level: Not on file  Occupational History  . Occupation: Data processing manager ---self employed    Employer: [employer]  Social Needs  . Financial resource strain: Not on file  . Food insecurity:    Worry: Not on file    Inability: Not on file  . Transportation needs:    Medical: Not on file    Non-medical: Not on file  Tobacco Use  . Smoking status: Never  Smoker  . Smokeless tobacco: Current User    Types: Chew  Substance and Sexual Activity  . Alcohol use: Yes    Alcohol/week: 3.0 standard drinks    Types: 3 Glasses of wine per week  . Drug use: No  . Sexual activity: Not Currently    Partners: Female  Lifestyle  . Physical activity:    Days per week: Not on file    Minutes per session: Not on file  . Stress: Not on file  Relationships  . Social connections:    Talks on phone: Not on file    Gets together: Not on file    Attends religious service: Not on file    Active member of club or organization: Not on file    Attends meetings of clubs or organizations: Not on file    Relationship status: Not on file  . Intimate partner violence:    Fear of current or ex partner: Not on file    Emotionally abused: Not on file    Physically abused: Not on file    Forced sexual activity: Not on file  Other Topics Concern  . Not on file  Social History Narrative   Exercise-- walking    Lives alone.   Brother lives in [place], [place]    Family History  Problem Relation Age of Onset  . Hypertension Mother   . Diabetes Mother   . Hypertension Father   . Diabetes Father   . Hyperlipidemia Father     Current Outpatient Medications  Medication Sig Dispense Refill  . ACCU-CHEK FASTCLIX LANCETS MISC Use as directed once a day.  Dx code: E11.9 100 each 1  . albuterol (PROAIR HFA) 108 (90 Base) MCG/ACT inhaler Inhale 1-2 puffs into the lungs every 6 (six) hours as needed for wheezing or shortness of breath. 1 Inhaler 3  . apixaban (ELIQUIS) 5 MG TABS tablet Take 1 tablet (5 mg total) by mouth 2 (two) times daily. 180 tablet 3  . atorvastatin (LIPITOR) 40 MG tablet Take 1 tablet (40 mg total) by mouth at bedtime. 90 tablet 3  . blood glucose meter kit and supplies Dispense based on patient and insurance preference. Use as directed once a day. Dx Code E11.9. 1 each 0  . Blood Glucose Monitoring Suppl (ACCU-CHEK GUIDE) w/Device KIT 1 each by  Does not apply route daily. DX Code: E11.9 1 kit 0  . carvedilol (COREG) 25 MG tablet Take 1 tablet (25 mg total) by mouth 2 (two) times daily with a meal. 60 tablet 3  . dapagliflozin propanediol (FARXIGA) 10 MG TABS tablet Take 10 mg by mouth daily. (Patient not taking: Reported on 07/15/2018) 30 tablet 3  . digoxin (LANOXIN) 0.125 MG tablet Take 0.5 tablets (0.0625 mg total) by mouth daily. 15 tablet 5  . furosemide (LASIX) 40 MG tablet Take 1 tablet (40 mg total) by mouth daily as needed. 30 tablet 11  . glucose blood (ACCU-CHEK GUIDE) test strip Use as directed once a  day.  Dx code: E11.9 100 each 1  . guaiFENesin (MUCINEX) 600 MG 12 hr tablet Take 1 tablet (600 mg total) by mouth 2 (two) times daily as needed for to loosen phlegm.    Marland Kitchen HYDROcodone-homatropine (HYCODAN) 5-1.5 MG/5ML syrup Take 5 mLs by mouth every 6 (six) hours as needed for cough. 120 mL 0  . Icosapent Ethyl (VASCEPA) 1 g CAPS Take 2 capsules (2 g total) by mouth 2 (two) times daily. 120 capsule 6  . metFORMIN (GLUCOPHAGE) 500 MG tablet Take 1 tablet (500 mg total) by mouth 2 (two) times daily with a meal. 180 tablet 0  . mometasone-formoterol (DULERA) 200-5 MCG/ACT AERO Inhale 2 puffs into the lungs 2 (two) times daily. Further refills per PCP 1 Inhaler 0  . nitroGLYCERIN (NITROSTAT) 0.4 MG SL tablet Place 1 tablet (0.4 mg total) under the tongue every 5 (five) minutes as needed for chest pain. 25 tablet 3  . predniSONE (DELTASONE) 10 MG tablet Take 80mx2weeks,20mgx2weeks,then 111maily 180 tablet 1  . spironolactone (ALDACTONE) 25 MG tablet TAKE 1 TABLET BY MOUTH DAILY. 30 tablet 5   No current facility-administered medications for this visit.    There were no vitals filed for this visit.   Wt Readings from Last 3 Encounters:  07/15/18 96.1 kg (211 lb 12.8 oz)  07/10/18 96.3 kg (212 lb 3.2 oz)  06/18/18 95.3 kg (210 lb)   PHYSICAL EXAM: General: Well appearing. No resp difficulty. HEENT: Normal Neck: Supple. JVP  5-6. Carotids 2+ bilat; no bruits. No thyromegaly or nodule noted. Cor: PMI nondisplaced. RRR, No M/G/R noted Lungs: CTAB, normal effort. Abdomen: Soft, non-tender, non-distended, no HSM. No bruits or masses. +BS  Extremities: No cyanosis, clubbing, or rash. R and LLE no edema.  Neuro: Alert & orientedx3, cranial nerves grossly intact. moves all 4 extremities w/o difficulty. Affect pleasant   ASSESSMENT & PLAN: 1. Chronic systolic CHF: Ischemic cardiomyopathy. Medtronic ICD single chamber. 2/19 admission for decompensated HF requiring milrinone. ECHO 2/7/019 EF 15% Mildly dilated RV. CPX in 2/19 was suggestive of severe limitation due to HF.  Currently NYHA II symptoms, seems to be doing better than would be predicted from CPX and 2/19 admission.  RHC in 4/19 showed normal filling pressures and preserved cardiac output. Volume status stable by exam and optivol.  - Continue Coreg 25 mg bid.   - Continue digoxin 0.0625 mg daily, check level today.  - Entresto stopped 07/15/2018 with hypotension. With elevated BP, will restart at 49/51 mg BID and follow. Off farxiga as below.  - Continue spironolactone 25 mg daily.   - Has IVCD, not LBBB-like.  Saw Dr. KlCaryl Comeswill not upgrade ICD to CRT.  - He may be a future LVAD candidate.   2. Afib/ Atrial flutter: Paroxysmal.  Admitted in 2/19 with severe HF decompensation in setting of atrial flutter with RVR.  He was cardioverted.  He then had atrial flutter ablation in 3/19.   - Regular on exam. No AF/AT on interrogation.  - He is now off amiodarone (question of lung toxicity, would not re-challenge in future).   - Continue Eliquis 5 mg bid. Denies bleeding.  3. CAD: S/p CABG.  Last intervention was PCI to LCx in 2016.  - No s/s of ischemia.    - Continue atorvastatin.  - Started on Vascepa 06/14/18 with TGs 348. - No ASA with Eliquis use.  4. Pulmonary fibrosis: PFTs in 2/19 were restrictive, and high resolution CT showed interstitial lung disease.  Amiodarone  was stopped due to concern for possible toxicity.  He has seen [name] => bronchoscopy suggestive of ILD related to amiodarone.  He is now on prednisone, this has helped his cough. No change.  - Has followup with [name] 07/23/2018 arranged.  With significant weight gain, think he needs to start tapering prednisone.  5. OSA: Uses CPAP. No change.  6. CKD: Stage 3.  - BMET ok 07/15/2018.   7. Pulmonary hypertension: Suspect group 3 PH due to lung disease (ILD). - Meds as above.  8. DM2 - Off farxiga  Resume Entresto with HTN. Suspect it was mostly related to hypovolemia in setting of Entresto + Farxiga. Holding farxiga for now.   Shirley Friar, PA-C  07/18/2018  Greater than 50% of the 25 minute visit was spent in counseling/coordination of care regarding disease state education, salt/fluid restriction, sliding scale diuretics, and medication compliance.

## 2018-07-19 ENCOUNTER — Telehealth (HOSPITAL_COMMUNITY): Payer: Self-pay | Admitting: Licensed Clinical Social Worker

## 2018-07-19 NOTE — Telephone Encounter (Signed)
CSW attempted calling patient last week and again today to see if pt needed Entresto assistance- unable to leave voicemail.  CSW will continue to follow and assist as needed  Jorge Ny, LCSW Clinical Social Worker [facility] [phone]

## 2018-07-22 ENCOUNTER — Other Ambulatory Visit: Payer: Self-pay | Admitting: *Deleted

## 2018-07-22 MED FILL — metFORMIN HCL 500 MG TABS: 500 | 30 days supply | Qty: 60 | Fill #3

## 2018-07-23 ENCOUNTER — Encounter (HOSPITAL_COMMUNITY): Payer: Self-pay | Admitting: *Deleted

## 2018-07-23 ENCOUNTER — Ambulatory Visit: Payer: Self-pay | Admitting: Internal Medicine

## 2018-07-23 ENCOUNTER — Other Ambulatory Visit: Payer: Self-pay

## 2018-07-23 NOTE — Progress Notes (Signed)
Spoke with pt for pre-op call. Pt has a very extensive cardiac history with stents, CABG, ICD implant, cardiomyopathy and A-fib/flutter. Pt's cardiologists are Dr. [name] and Dr. Caryl Comes. Pt denies any recent chest pain or sob. Pt had an Echo on 07/08/18, EF 20%, Dr. Conrad Kailua, Anesthesiologist reviewed it this evening - no concerns were noted. Pt is type 2 diabetic. Last A1C was 6.4 on 11/20/17. Pt states he does not check his blood sugar at home. He did say he has a CBG meter. I instructed him to check his blood sugar in the AM when he gets up. If blood sugar is 70 or below, treat with 1/2 cup of clear juice (apple or cranberry) and recheck blood sugar 15 minutes after drinking juice. Pt voiced understanding.

## 2018-07-23 NOTE — Anesthesia Preprocedure Evaluation (Addendum)
Anesthesia Evaluation  Patient identified by MRN, date of birth, ID band Patient awake    Reviewed: Allergy & Precautions, NPO status , Patient's Chart, lab work & pertinent test results, reviewed documented beta blocker date and time   History of Anesthesia Complications Negative for: history of anesthetic complications  Airway Mallampati: III  TM Distance: >3 FB Neck ROM: Full    Dental  (+) Dental Advisory Given, Teeth Intact   Pulmonary asthma , sleep apnea ,    breath sounds clear to auscultation       Cardiovascular hypertension, Pt. on home beta blockers and Pt. on medications (-) angina+ CAD, + Past MI, + Cardiac Stents, + CABG and +CHF  + Cardiac Defibrillator  Rhythm:Regular Rate:Normal   '20 Carotid US - 1/39% b/l ICAS  '20 TTE - LV cavity size was mildly dilated. EF was 20%. LA was mildly dilated.    Neuro/Psych PSYCHIATRIC DISORDERS Anxiety Depression negative neurological ROS     GI/Hepatic hiatal hernia, GERD  Controlled,(+)     substance abuse  alcohol use,   Endo/Other  diabetes, Type 2, Oral Hypoglycemic Agents Obesity   Renal/GU CRFRenal disease     Musculoskeletal negative musculoskeletal ROS (+)   Abdominal (+) + obese,   Peds  Hematology negative hematology ROS (+)   Anesthesia Other Findings   Reproductive/Obstetrics                          Anesthesia Physical Anesthesia Plan  ASA: IV  Anesthesia Plan: General   Post-op Pain Management:    Induction: Intravenous  PONV Risk Score and Plan: 2 and Treatment may vary due to age or medical condition, Ondansetron and Midazolam  Airway Management Planned: Oral ETT  Additional Equipment: Arterial line  Intra-op Plan:   Post-operative Plan: Extubation in OR  Informed Consent: I have reviewed the patients History and Physical, chart, labs and discussed the procedure including the risks, benefits and  alternatives for the proposed anesthesia with the patient or authorized representative who has indicated his/her understanding and acceptance.     Dental advisory given  Plan Discussed with: CRNA and Anesthesiologist  Anesthesia Plan Comments:        Anesthesia Quick Evaluation

## 2018-07-24 ENCOUNTER — Inpatient Hospital Stay (HOSPITAL_COMMUNITY): Payer: Medicare HMO | Admitting: Anesthesiology

## 2018-07-24 ENCOUNTER — Encounter (HOSPITAL_COMMUNITY)
Admission: RE | Disposition: A | Discharge status: Home / Self Care | Payer: Self-pay | Source: Home / Self Care | Attending: Surgery

## 2018-07-24 ENCOUNTER — Inpatient Hospital Stay (HOSPITAL_COMMUNITY)
Admission: RE | Admit: 2018-07-24 | Discharge: 2018-07-25 | DRG: 253 | Disposition: A | Discharge status: Home / Self Care | Payer: Medicare HMO | Attending: Surgery | Admitting: Surgery

## 2018-07-24 ENCOUNTER — Other Ambulatory Visit: Payer: Self-pay

## 2018-07-24 ENCOUNTER — Encounter (HOSPITAL_COMMUNITY): Payer: Self-pay

## 2018-07-24 DIAGNOSIS — Z79899 Other long term (current) drug therapy: Secondary | ICD-10-CM

## 2018-07-24 DIAGNOSIS — Z8249 Family history of ischemic heart disease and other diseases of the circulatory system: Secondary | ICD-10-CM

## 2018-07-24 DIAGNOSIS — Z951 Presence of aortocoronary bypass graft: Secondary | ICD-10-CM

## 2018-07-24 DIAGNOSIS — G4733 Obstructive sleep apnea (adult) (pediatric): Secondary | ICD-10-CM | POA: Diagnosis present

## 2018-07-24 DIAGNOSIS — I252 Old myocardial infarction: Secondary | ICD-10-CM

## 2018-07-24 DIAGNOSIS — I110 Hypertensive heart disease with heart failure: Principal | ICD-10-CM | POA: Diagnosis present

## 2018-07-24 DIAGNOSIS — Z955 Presence of coronary angioplasty implant and graft: Secondary | ICD-10-CM

## 2018-07-24 DIAGNOSIS — I429 Cardiomyopathy, unspecified: Secondary | ICD-10-CM | POA: Diagnosis present

## 2018-07-24 DIAGNOSIS — F419 Anxiety disorder, unspecified: Secondary | ICD-10-CM | POA: Diagnosis present

## 2018-07-24 DIAGNOSIS — E669 Obesity, unspecified: Secondary | ICD-10-CM | POA: Diagnosis present

## 2018-07-24 DIAGNOSIS — J849 Interstitial pulmonary disease, unspecified: Secondary | ICD-10-CM | POA: Diagnosis present

## 2018-07-24 DIAGNOSIS — Z833 Family history of diabetes mellitus: Secondary | ICD-10-CM

## 2018-07-24 DIAGNOSIS — Z7952 Long term (current) use of systemic steroids: Secondary | ICD-10-CM

## 2018-07-24 DIAGNOSIS — E1122 Type 2 diabetes mellitus with diabetic chronic kidney disease: Secondary | ICD-10-CM | POA: Diagnosis not present

## 2018-07-24 DIAGNOSIS — F329 Major depressive disorder, single episode, unspecified: Secondary | ICD-10-CM | POA: Diagnosis present

## 2018-07-24 DIAGNOSIS — I509 Heart failure, unspecified: Secondary | ICD-10-CM | POA: Diagnosis present

## 2018-07-24 DIAGNOSIS — I5022 Chronic systolic (congestive) heart failure: Secondary | ICD-10-CM

## 2018-07-24 DIAGNOSIS — I2510 Atherosclerotic heart disease of native coronary artery without angina pectoris: Secondary | ICD-10-CM | POA: Diagnosis present

## 2018-07-24 DIAGNOSIS — I130 Hypertensive heart and chronic kidney disease with heart failure and stage 1 through stage 4 chronic kidney disease, or unspecified chronic kidney disease: Secondary | ICD-10-CM | POA: Diagnosis not present

## 2018-07-24 DIAGNOSIS — J45909 Unspecified asthma, uncomplicated: Secondary | ICD-10-CM | POA: Diagnosis present

## 2018-07-24 DIAGNOSIS — F17220 Nicotine dependence, chewing tobacco, uncomplicated: Secondary | ICD-10-CM | POA: Diagnosis present

## 2018-07-24 DIAGNOSIS — Z7984 Long term (current) use of oral hypoglycemic drugs: Secondary | ICD-10-CM

## 2018-07-24 DIAGNOSIS — Z6830 Body mass index (BMI) 30.0-30.9, adult: Secondary | ICD-10-CM

## 2018-07-24 DIAGNOSIS — Z7901 Long term (current) use of anticoagulants: Secondary | ICD-10-CM

## 2018-07-24 DIAGNOSIS — E785 Hyperlipidemia, unspecified: Secondary | ICD-10-CM | POA: Diagnosis present

## 2018-07-24 DIAGNOSIS — K219 Gastro-esophageal reflux disease without esophagitis: Secondary | ICD-10-CM | POA: Diagnosis present

## 2018-07-24 DIAGNOSIS — E875 Hyperkalemia: Secondary | ICD-10-CM | POA: Diagnosis present

## 2018-07-24 DIAGNOSIS — N183 Chronic kidney disease, stage 3 (moderate): Secondary | ICD-10-CM | POA: Diagnosis not present

## 2018-07-24 DIAGNOSIS — Z95810 Presence of automatic (implantable) cardiac defibrillator: Secondary | ICD-10-CM

## 2018-07-24 DIAGNOSIS — I4891 Unspecified atrial fibrillation: Secondary | ICD-10-CM | POA: Diagnosis present

## 2018-07-24 DIAGNOSIS — E119 Type 2 diabetes mellitus without complications: Secondary | ICD-10-CM | POA: Diagnosis present

## 2018-07-24 HISTORY — DX: Unspecified hemorrhoids: K64.9

## 2018-07-24 LAB — CBC
HCT: 43 % (ref 39.0–52.0)
Hemoglobin: 13.7 g/dL (ref 13.0–17.0)
MCH: 37.5 pg — ABNORMAL HIGH (ref 26.0–34.0)
MCHC: 31.9 g/dL (ref 30.0–36.0)
MCV: 117.8 fL — ABNORMAL HIGH (ref 80.0–100.0)
Platelets: 156 10*3/uL (ref 150–400)
RBC: 3.65 MIL/uL — ABNORMAL LOW (ref 4.22–5.81)
RDW: 15.2 % (ref 11.5–15.5)
WBC: 5.6 10*3/uL (ref 4.0–10.5)
nRBC: 0 % (ref 0.0–0.2)

## 2018-07-24 LAB — URINALYSIS, ROUTINE W REFLEX MICROSCOPIC
Bilirubin Urine: NEGATIVE
Glucose, UA: 50 mg/dL — AB
Hgb urine dipstick: NEGATIVE
Ketones, ur: NEGATIVE mg/dL
Leukocytes, UA: NEGATIVE
Nitrite: NEGATIVE
Protein, ur: NEGATIVE mg/dL
Specific Gravity, Urine: 1.017 (ref 1.005–1.030)
pH: 5 (ref 5.0–8.0)

## 2018-07-24 LAB — COMPREHENSIVE METABOLIC PANEL
ALT: 29 U/L (ref 0–44)
AST: 20 U/L (ref 15–41)
Albumin: 3.5 g/dL (ref 3.5–5.0)
Alkaline Phosphatase: 41 U/L (ref 38–126)
Anion gap: 10 (ref 5–15)
BUN: 35 mg/dL — ABNORMAL HIGH (ref 6–20)
CALCIUM: 9.1 mg/dL (ref 8.9–10.3)
CO2: 19 mmol/L — ABNORMAL LOW (ref 22–32)
Chloride: 109 mmol/L (ref 98–111)
Creatinine, Ser: 1.37 mg/dL — ABNORMAL HIGH (ref 0.61–1.24)
GFR calc Af Amer: >60 mL/min (ref >60)
GFR calc non Af Amer: 57 mL/min — ABNORMAL LOW (ref >60)
Glucose, Bld: 195 mg/dL — ABNORMAL HIGH (ref 70–99)
Potassium: 4.3 mmol/L (ref 3.5–5.1)
Sodium: 138 mmol/L (ref 135–145)
Total Bilirubin: 0.7 mg/dL (ref 0.3–1.2)
Total Protein: 6.5 g/dL (ref 6.5–8.1)

## 2018-07-24 LAB — BLOOD GAS, ARTERIAL
Acid-base deficit: 3.7 mmol/L — ABNORMAL HIGH (ref 0.0–2.0)
Bicarbonate: 20.4 mmol/L (ref 20.0–28.0)
Drawn by: 449841
FIO2: 21
O2 Saturation: 97.2 %
PATIENT TEMPERATURE: 98.6
pCO2 arterial: 34.3 mmHg (ref 32.0–48.0)
pH, Arterial: 7.392 (ref 7.350–7.450)
pO2, Arterial: 94.7 mmHg (ref 83.0–108.0)

## 2018-07-24 LAB — TYPE AND SCREEN
ABO/RH(D): A POS
Antibody Screen: NEGATIVE

## 2018-07-24 LAB — SURGICAL PCR SCREEN
MRSA, PCR: NEGATIVE
Staphylococcus aureus: POSITIVE — AB

## 2018-07-24 LAB — APTT: aPTT: 26 seconds (ref 24–36)

## 2018-07-24 LAB — GLUCOSE, CAPILLARY: Glucose-Capillary: 170 mg/dL — ABNORMAL HIGH (ref 70–99)

## 2018-07-24 LAB — PROTIME-INR
INR: 1.04
Prothrombin Time: 13.5 seconds (ref 11.4–15.2)

## 2018-07-24 SURGERY — INSERTION, CAROTID SINUS BAROREFLEX ACTIVATION DEVICE
Anesthesia: General | Site: Neck | Laterality: Right

## 2018-07-24 MED ORDER — BISACODYL 5 MG PO TBEC: 5.0000 mg | DELAYED_RELEASE_TABLET | Freq: Every day | ORAL | Status: DC | PRN

## 2018-07-24 MED ORDER — ETOMIDATE 2 MG/ML IV SOLN
INTRAVENOUS | Status: AC
  Filled 2018-07-24: qty 20

## 2018-07-24 MED ORDER — FUROSEMIDE 40 MG PO TABS: 40.0000 mg | ORAL_TABLET | Freq: Every day | ORAL | Status: DC | PRN

## 2018-07-24 MED ORDER — ALBUTEROL SULFATE (2.5 MG/3ML) 0.083% IN NEBU: 3.0000 mL | INHALATION_SOLUTION | Freq: Four times a day (QID) | RESPIRATORY_TRACT | Status: DC | PRN

## 2018-07-24 MED ORDER — FENTANYL CITRATE (PF) 100 MCG/2ML IJ SOLN
25.0000 ug | INTRAMUSCULAR | Status: DC | PRN
  Administered 2018-07-24 (×4): 25 ug via INTRAVENOUS

## 2018-07-24 MED ORDER — MIDAZOLAM HCL 2 MG/2ML IJ SOLN: INTRAMUSCULAR | Status: DC | PRN

## 2018-07-24 MED ORDER — MAGNESIUM SULFATE 2 GM/50ML IV SOLN: 2.0000 g | Freq: Every day | INTRAVENOUS | Status: DC | PRN

## 2018-07-24 MED ORDER — SODIUM CHLORIDE 0.9 % IV SOLN
INTRAVENOUS | Status: AC
  Filled 2018-07-24: qty 500000

## 2018-07-24 MED ORDER — METOPROLOL TARTRATE 5 MG/5ML IV SOLN: 2.0000 mg | INTRAVENOUS | Status: DC | PRN

## 2018-07-24 MED ORDER — ROCURONIUM BROMIDE 10 MG/ML (PF) SYRINGE
PREFILLED_SYRINGE | INTRAVENOUS | Status: DC | PRN
  Administered 2018-07-24 (×2): 50 mg via INTRAVENOUS

## 2018-07-24 MED ORDER — SENNOSIDES-DOCUSATE SODIUM 8.6-50 MG PO TABS: 1.0000 | ORAL_TABLET | Freq: Every evening | ORAL | Status: DC | PRN

## 2018-07-24 MED ORDER — GUAIFENESIN ER 600 MG PO TB12: 600.0000 mg | ORAL_TABLET | Freq: Two times a day (BID) | ORAL | Status: DC | PRN

## 2018-07-24 MED ORDER — SODIUM CHLORIDE 0.9 % IV SOLN
INTRAVENOUS | Status: DC | PRN
  Administered 2018-07-24: 500 mL

## 2018-07-24 MED ORDER — MIDAZOLAM HCL 5 MG/5ML IJ SOLN
INTRAMUSCULAR | Status: DC | PRN
  Administered 2018-07-24 (×2): 1 mg via INTRAVENOUS
  Administered 2018-07-24: 2 mg via INTRAVENOUS

## 2018-07-24 MED ORDER — PANTOPRAZOLE SODIUM 40 MG PO TBEC
40.0000 mg | DELAYED_RELEASE_TABLET | Freq: Every day | ORAL | Status: DC
  Administered 2018-07-24: 40 mg via ORAL
  Filled 2018-07-24: qty 1

## 2018-07-24 MED ORDER — SODIUM CHLORIDE 0.9 % IV SOLN
INTRAVENOUS | Status: DC | PRN
  Administered 2018-07-24 (×2): 20 ug/min via INTRAVENOUS

## 2018-07-24 MED ORDER — LIDOCAINE HCL (PF) 1 % IJ SOLN
INTRAMUSCULAR | Status: AC
  Filled 2018-07-24: qty 30

## 2018-07-24 MED ORDER — OXYCODONE HCL 5 MG/5ML PO SOLN: 5.0000 mg | Freq: Once | ORAL | Status: DC | PRN

## 2018-07-24 MED ORDER — DOCUSATE SODIUM 100 MG PO CAPS
100.0000 mg | ORAL_CAPSULE | Freq: Every day | ORAL | Status: DC
  Administered 2018-07-25: 100 mg via ORAL
  Filled 2018-07-24: qty 1

## 2018-07-24 MED ORDER — ROCURONIUM BROMIDE 50 MG/5ML IV SOSY
PREFILLED_SYRINGE | INTRAVENOUS | Status: AC
  Filled 2018-07-24: qty 5

## 2018-07-24 MED ORDER — MUPIROCIN 2 % EX OINT: 1.0000 "application " | TOPICAL_OINTMENT | Freq: Once | CUTANEOUS | Status: DC

## 2018-07-24 MED ORDER — DIGOXIN 125 MCG PO TABS
0.0625 mg | ORAL_TABLET | Freq: Every day | ORAL | Status: DC
  Administered 2018-07-25: 0.0625 mg via ORAL
  Filled 2018-07-24: qty 1

## 2018-07-24 MED ORDER — CEFAZOLIN SODIUM-DEXTROSE 2-4 GM/100ML-% IV SOLN
2.0000 g | Freq: Three times a day (TID) | INTRAVENOUS | Status: AC
  Administered 2018-07-24 – 2018-07-25 (×2): 2 g via INTRAVENOUS
  Filled 2018-07-24 (×2): qty 100

## 2018-07-24 MED ORDER — ASPIRIN EC 325 MG PO TBEC
325.0000 mg | DELAYED_RELEASE_TABLET | Freq: Every day | ORAL | Status: DC
  Administered 2018-07-24 – 2018-07-25 (×2): 325 mg via ORAL
  Filled 2018-07-24 (×2): qty 1

## 2018-07-24 MED ORDER — HYDRALAZINE HCL 20 MG/ML IJ SOLN
10.0000 mg | Freq: Once | INTRAMUSCULAR | Status: AC
  Administered 2018-07-24: 10 mg via INTRAVENOUS

## 2018-07-24 MED ORDER — BUPIVACAINE HCL (PF) 0.5 % IJ SOLN
INTRAMUSCULAR | Status: AC
  Filled 2018-07-24: qty 30

## 2018-07-24 MED ORDER — ALUM & MAG HYDROXIDE-SIMETH 200-200-20 MG/5ML PO SUSP: 15.0000 mL | ORAL | Status: DC | PRN

## 2018-07-24 MED ORDER — LACTATED RINGERS IV SOLN
INTRAVENOUS | Status: DC | PRN
  Administered 2018-07-24 (×2): via INTRAVENOUS

## 2018-07-24 MED ORDER — SODIUM CHLORIDE 0.9 % IV SOLN
INTRAVENOUS | Status: AC
  Filled 2018-07-24: qty 1.2

## 2018-07-24 MED ORDER — OXYCODONE HCL 5 MG PO TABS
5.0000 mg | ORAL_TABLET | ORAL | Status: DC | PRN
  Administered 2018-07-24: 5 mg via ORAL
  Administered 2018-07-25: 10 mg via ORAL
  Filled 2018-07-24: qty 1
  Filled 2018-07-24: qty 2

## 2018-07-24 MED ORDER — MOMETASONE FURO-FORMOTEROL FUM 200-5 MCG/ACT IN AERO
2.0000 | INHALATION_SPRAY | Freq: Two times a day (BID) | RESPIRATORY_TRACT | Status: DC | PRN
  Filled 2018-07-24: qty 8.8

## 2018-07-24 MED ORDER — ENOXAPARIN SODIUM 40 MG/0.4ML ~~LOC~~ SOLN: 40.0000 mg | SUBCUTANEOUS | Status: DC

## 2018-07-24 MED ORDER — OXYCODONE HCL 5 MG PO TABS: 5.0000 mg | ORAL_TABLET | Freq: Once | ORAL | Status: DC | PRN

## 2018-07-24 MED ORDER — SODIUM CHLORIDE 0.9 % IV SOLN: INTRAVENOUS | Status: DC

## 2018-07-24 MED ORDER — CARVEDILOL 25 MG PO TABS
25.0000 mg | ORAL_TABLET | Freq: Two times a day (BID) | ORAL | Status: DC
  Administered 2018-07-24 – 2018-07-25 (×2): 25 mg via ORAL
  Filled 2018-07-24 (×2): qty 1

## 2018-07-24 MED ORDER — SPIRONOLACTONE 25 MG PO TABS
25.0000 mg | ORAL_TABLET | Freq: Every day | ORAL | Status: DC
  Filled 2018-07-24: qty 1

## 2018-07-24 MED ORDER — DEXAMETHASONE SODIUM PHOSPHATE 10 MG/ML IJ SOLN
INTRAMUSCULAR | Status: DC | PRN
  Administered 2018-07-24: 4 mg via INTRAVENOUS

## 2018-07-24 MED ORDER — HYDRALAZINE HCL 20 MG/ML IJ SOLN
5.0000 mg | INTRAMUSCULAR | Status: DC | PRN
  Administered 2018-07-24: 5 mg via INTRAVENOUS
  Filled 2018-07-24: qty 1

## 2018-07-24 MED ORDER — ONDANSETRON HCL 4 MG/2ML IJ SOLN: 4.0000 mg | Freq: Four times a day (QID) | INTRAMUSCULAR | Status: DC | PRN

## 2018-07-24 MED ORDER — GUAIFENESIN-DM 100-10 MG/5ML PO SYRP: 15.0000 mL | ORAL_SOLUTION | ORAL | Status: DC | PRN

## 2018-07-24 MED ORDER — MUPIROCIN 2 % EX OINT
TOPICAL_OINTMENT | CUTANEOUS | Status: AC
  Filled 2018-07-24: qty 22

## 2018-07-24 MED ORDER — APIXABAN 5 MG PO TABS
5.0000 mg | ORAL_TABLET | Freq: Two times a day (BID) | ORAL | Status: DC
  Administered 2018-07-25: 5 mg via ORAL
  Filled 2018-07-24: qty 1

## 2018-07-24 MED ORDER — ONDANSETRON HCL 4 MG/2ML IJ SOLN: 4.0000 mg | Freq: Once | INTRAMUSCULAR | Status: DC | PRN

## 2018-07-24 MED ORDER — SODIUM CHLORIDE 0.9 % IV SOLN
0.0500 ug/kg/min | INTRAVENOUS | Status: AC
  Administered 2018-07-24: .15 ug/kg/min via INTRAVENOUS
  Filled 2018-07-24: qty 4000

## 2018-07-24 MED ORDER — ETOMIDATE 2 MG/ML IV SOLN
INTRAVENOUS | Status: DC | PRN
  Administered 2018-07-24: 5 mg via INTRAVENOUS
  Administered 2018-07-24: 10 mg via INTRAVENOUS
  Administered 2018-07-24: 5 mg via INTRAVENOUS

## 2018-07-24 MED ORDER — PROPOFOL 10 MG/ML IV BOLUS
INTRAVENOUS | Status: AC
  Filled 2018-07-24: qty 20

## 2018-07-24 MED ORDER — CHLORHEXIDINE GLUCONATE CLOTH 2 % EX PADS: 6.0000 | MEDICATED_PAD | Freq: Once | CUTANEOUS | Status: DC

## 2018-07-24 MED ORDER — POTASSIUM CHLORIDE CRYS ER 20 MEQ PO TBCR: 20.0000 meq | EXTENDED_RELEASE_TABLET | Freq: Every day | ORAL | Status: DC | PRN

## 2018-07-24 MED ORDER — FENTANYL CITRATE (PF) 100 MCG/2ML IJ SOLN
INTRAMUSCULAR | Status: AC
  Administered 2018-07-24: 25 ug via INTRAVENOUS
  Filled 2018-07-24: qty 2

## 2018-07-24 MED ORDER — ACETAMINOPHEN 325 MG RE SUPP: 325.0000 mg | RECTAL | Status: DC | PRN

## 2018-07-24 MED ORDER — MIDAZOLAM HCL 2 MG/2ML IJ SOLN
INTRAMUSCULAR | Status: AC
  Filled 2018-07-24: qty 2

## 2018-07-24 MED ORDER — ATORVASTATIN CALCIUM 40 MG PO TABS
40.0000 mg | ORAL_TABLET | Freq: Every day | ORAL | Status: DC
  Administered 2018-07-24: 40 mg via ORAL
  Filled 2018-07-24: qty 1

## 2018-07-24 MED ORDER — SUCCINYLCHOLINE CHLORIDE 200 MG/10ML IV SOSY
PREFILLED_SYRINGE | INTRAVENOUS | Status: AC
  Filled 2018-07-24: qty 10

## 2018-07-24 MED ORDER — HYDRALAZINE HCL 20 MG/ML IJ SOLN
INTRAMUSCULAR | Status: AC
  Filled 2018-07-24: qty 1

## 2018-07-24 MED ORDER — NITROGLYCERIN 0.4 MG SL SUBL: 0.4000 mg | SUBLINGUAL_TABLET | SUBLINGUAL | Status: DC | PRN

## 2018-07-24 MED ORDER — PREDNISONE 10 MG PO TABS
10.0000 mg | ORAL_TABLET | Freq: Every day | ORAL | Status: DC
  Administered 2018-07-25: 10 mg via ORAL
  Filled 2018-07-24: qty 1

## 2018-07-24 MED ORDER — 0.9 % SODIUM CHLORIDE (POUR BTL) OPTIME
TOPICAL | Status: DC | PRN
  Administered 2018-07-24: 2000 mL

## 2018-07-24 MED ORDER — PHENOL 1.4 % MT LIQD: 1.0000 | OROMUCOSAL | Status: DC | PRN

## 2018-07-24 MED ORDER — SACUBITRIL-VALSARTAN 49-51 MG PO TABS
1.0000 | ORAL_TABLET | Freq: Two times a day (BID) | ORAL | Status: DC
  Administered 2018-07-24 – 2018-07-25 (×3): 1 via ORAL
  Filled 2018-07-24 (×3): qty 1

## 2018-07-24 MED ORDER — CEFAZOLIN SODIUM-DEXTROSE 2-4 GM/100ML-% IV SOLN
2.0000 g | INTRAVENOUS | Status: AC
  Administered 2018-07-24: 2 g via INTRAVENOUS
  Filled 2018-07-24: qty 100

## 2018-07-24 MED ORDER — MIDAZOLAM HCL 2 MG/2ML IJ SOLN
INTRAMUSCULAR | Status: AC
  Filled 2018-07-24: qty 4

## 2018-07-24 MED ORDER — LIDOCAINE 2% (20 MG/ML) 5 ML SYRINGE
INTRAMUSCULAR | Status: DC | PRN
  Administered 2018-07-24: 80 mg via INTRAVENOUS

## 2018-07-24 MED ORDER — DEXAMETHASONE SODIUM PHOSPHATE 10 MG/ML IJ SOLN
INTRAMUSCULAR | Status: AC
  Filled 2018-07-24: qty 1

## 2018-07-24 MED ORDER — SODIUM CHLORIDE 0.9 % IV SOLN: 500.0000 mL | Freq: Once | INTRAVENOUS | Status: DC | PRN

## 2018-07-24 MED ORDER — ONDANSETRON HCL 4 MG/2ML IJ SOLN
INTRAMUSCULAR | Status: DC | PRN
  Administered 2018-07-24: 4 mg via INTRAVENOUS

## 2018-07-24 MED ORDER — LABETALOL HCL 5 MG/ML IV SOLN: 10.0000 mg | INTRAVENOUS | Status: DC | PRN

## 2018-07-24 MED ORDER — ACETAMINOPHEN 325 MG PO TABS: 325.0000 mg | ORAL_TABLET | ORAL | Status: DC | PRN

## 2018-07-24 MED ORDER — METFORMIN HCL 500 MG PO TABS
500.0000 mg | ORAL_TABLET | Freq: Two times a day (BID) | ORAL | Status: DC
  Administered 2018-07-24 – 2018-07-25 (×2): 500 mg via ORAL
  Filled 2018-07-24 (×2): qty 1

## 2018-07-24 MED ORDER — LIDOCAINE 2% (20 MG/ML) 5 ML SYRINGE
INTRAMUSCULAR | Status: AC
  Filled 2018-07-24: qty 5

## 2018-07-24 MED ORDER — HYDROMORPHONE HCL 1 MG/ML IJ SOLN
0.5000 mg | INTRAMUSCULAR | Status: DC | PRN
  Administered 2018-07-24: 0.5 mg via INTRAVENOUS
  Filled 2018-07-24: qty 1

## 2018-07-24 SURGICAL SUPPLY — 40 items
ADH SKN CLS APL DERMABOND .7 (GAUZE/BANDAGES/DRESSINGS) ×1
CANISTER SUCT 3000ML PPV (MISCELLANEOUS) ×2 IMPLANT
CLIP VESOCCLUDE MED 6/CT (CLIP) IMPLANT
CLIP VESOCCLUDE SM WIDE 6/CT (CLIP) IMPLANT
COVER WAND RF STERILE (DRAPES) ×2 IMPLANT
CRADLE DONUT ADULT HEAD (MISCELLANEOUS) ×2 IMPLANT
DERMABOND ADVANCED (GAUZE/BANDAGES/DRESSINGS) ×1
DERMABOND ADVANCED .7 DNX12 (GAUZE/BANDAGES/DRESSINGS) ×1 IMPLANT
ELECT REM PT RETURN 9FT ADLT (ELECTROSURGICAL) ×2
ELECTRODE REM PT RTRN 9FT ADLT (ELECTROSURGICAL) ×1 IMPLANT
GENERATOR BAROSTIM 2102 CVRX (Generator) ×1 IMPLANT
GLOVE BIOGEL PI IND STRL 7.5 (GLOVE) ×1 IMPLANT
GLOVE BIOGEL PI INDICATOR 7.5 (GLOVE) ×2
GLOVE ECLIPSE 7.0 STRL STRAW (GLOVE) ×1 IMPLANT
GLOVE SURG SS PI 7.5 STRL IVOR (GLOVE) ×2 IMPLANT
GOWN STRL REUS W/ TWL LRG LVL3 (GOWN DISPOSABLE) ×3 IMPLANT
GOWN STRL REUS W/ TWL XL LVL3 (GOWN DISPOSABLE) ×1 IMPLANT
GOWN STRL REUS W/TWL LRG LVL3 (GOWN DISPOSABLE) ×6
GOWN STRL REUS W/TWL XL LVL3 (GOWN DISPOSABLE) ×2
HEMOSTAT SNOW SURGICEL 2X4 (HEMOSTASIS) IMPLANT
KIT BASIN OR (CUSTOM PROCEDURE TRAY) ×2 IMPLANT
KIT TURNOVER KIT B (KITS) ×2 IMPLANT
MARKER SKIN DUAL TIP RULER LAB (MISCELLANEOUS) ×2 IMPLANT
NDL 18GX1X1/2 (RX/OR ONLY) (NEEDLE) ×1 IMPLANT
NEEDLE 18GX1X1/2 (RX/OR ONLY) (NEEDLE) ×4 IMPLANT
NS IRRIG 1000ML POUR BTL (IV SOLUTION) ×4 IMPLANT
PACK CAROTID (CUSTOM PROCEDURE TRAY) ×2 IMPLANT
PAD ARMBOARD 7.5X6 YLW CONV (MISCELLANEOUS) ×4 IMPLANT
SUT ETHIBOND CT1 BRD #0 30IN (SUTURE) ×4 IMPLANT
SUT ETHILON 3 0 PS 1 (SUTURE) IMPLANT
SUT PROLENE 6 0 BV (SUTURE) ×18 IMPLANT
SUT SILK 0 FSL (SUTURE) IMPLANT
SUT VIC AB 3-0 SH 27 (SUTURE) ×6
SUT VIC AB 3-0 SH 27X BRD (SUTURE) ×2 IMPLANT
SUT VIC AB 4-0 PS2 18 (SUTURE) ×1 IMPLANT
SUT VICRYL 4-0 PS2 18IN ABS (SUTURE) ×4 IMPLANT
SYR 5ML LL (SYRINGE) ×3 IMPLANT
SYR BULB IRRIGATION 50ML (SYRINGE) ×2 IMPLANT
TOWEL GREEN STERILE (TOWEL DISPOSABLE) ×2 IMPLANT
WATER STERILE IRR 1000ML POUR (IV SOLUTION) ×2 IMPLANT

## 2018-07-24 NOTE — Anesthesia Procedure Notes (Signed)
Procedure Name: Intubation Date/Time: 07/24/2018 7:50 AM Performed by: Myna Bright, CRNA Pre-anesthesia Checklist: Patient identified, Emergency Drugs available, Patient being monitored and Suction available Patient Re-evaluated:Patient Re-evaluated prior to induction Oxygen Delivery Method: Circle system utilized Preoxygenation: Pre-oxygenation with 100% oxygen Induction Type: IV induction Ventilation: Mask ventilation without difficulty Grade View: Grade III Tube type: Oral Tube size: 7.5 mm Number of attempts: 2 Airway Equipment and Method: Video-laryngoscopy and Rigid stylet Placement Confirmation: ETT inserted through vocal cords under direct vision,  positive ETCO2 and breath sounds checked- equal and bilateral Secured at: 22 cm Tube secured with: Tape Dental Injury: Teeth and Oropharynx as per pre-operative assessment  Comments: Smooth IV induction. Easy mask airway. DL with MAC 4, view of epiglottis only, unable to lift epiglottis to view cords. Resumed mask ventilation. DL with Glidescope 4, good view with Glide, stiff epiglottis. ETT placed without resistance. Tiny upper lip laceration noted, ointment applied. +EtCO2, +BBS. VSS.

## 2018-07-24 NOTE — Transfer of Care (Signed)
Immediate Anesthesia Transfer of Care Note  Patient: Vincent Chandler  Procedure(s) Performed: INSERTION OF [name] (Right Neck)  Patient Location: PACU  Anesthesia Type:General  Level of Consciousness: awake, alert , oriented and patient cooperative  Airway & Oxygen Therapy: Patient Spontanous Breathing and Patient connected to nasal cannula oxygen  Post-op Assessment: Report given to RN, Post -op Vital signs reviewed and stable and Patient moving all extremities  Post vital signs: Reviewed and stable  Last Vitals:  Vitals Value Taken Time  BP 151/78 07/24/2018  9:24 AM  Temp    Pulse 84 07/24/2018  9:25 AM  Resp 11 07/24/2018  9:25 AM  SpO2 99 % 07/24/2018  9:25 AM  Vitals shown include unvalidated device data.  Last Pain:  Vitals:   07/24/18 0601  TempSrc:   PainSc: 0-No pain      Patients Stated Pain Goal: 0 (05/39/76 7341)  Complications: No apparent anesthesia complications

## 2018-07-24 NOTE — Anesthesia Procedure Notes (Signed)
Arterial Line Insertion Start/End1/22/2020 7:05 AM, 07/24/2018 7:15 AM Performed by: Jearld Pies, CRNA, CRNA  Patient location: Pre-op. Preanesthetic checklist: patient identified, IV checked, site marked, risks and benefits discussed, surgical consent, monitors and equipment checked and pre-op evaluation Lidocaine 1% used for infiltration Right, radial was placed Catheter size: 20 G Maximum sterile barriers used  Allen's test indicative of satisfactory collateral circulation Attempts: 2 Procedure performed without using ultrasound guided technique. Following insertion, dressing applied and Biopatch. Post procedure assessment: normal  Patient tolerated the procedure well with no immediate complications.

## 2018-07-24 NOTE — H&P (Signed)
[facility] [place]  Patient name: Vincent Chandler         MRN: [id number]        DOB: 04-11-1961          Sex: male   REQUESTING PROVIDER:    Dr. [name]   REASON FOR CONSULT:    Barostim eval  HISTORY OF PRESENT ILLNESS:   Vincent Chandler is a 58 y.o. male, who is being considered for the Barostim trial.  The patient has interstitial lung disease.  He suffers from coronary artery disease, status post CABG and PCI.  He has an ICD in place on the left side.  He is on Eliquis.  He is medically managed for hypertension and hyperlipidemia.  He is a diabetic and has a history of atrial fibrillation.  He is a non-smoker.  PAST MEDICAL HISTORY        Past Medical History:  Diagnosis Date  . AICD (automatic cardioverter/defibrillator) present   . Anginal pain ([place])   . Anxiety   . Asthma   . Atrial fibrillation-postoperative 11/28/2012  . Cardiomyopathy    alcohol use related  . CHF (congestive heart failure) ([place])   . Chronic back pain   . Coronary artery disease    drug eluting stent RCA 2005-EF 30%- s/p CABG x 4; 2/4 patent grafts with SVG-PLOM and SVG-RCA system totally occluded. There are collaterals from the LAD system to the RCA and the RCA is totally occluded proximally. There are no collaterals to the LCx territory. He then underwent successful PCI of the mid left circumflex artery with overlapping Synergy drug-eluting stents  . Depression   . Diabetes mellitus type II dx'd in the 1990's  . GERD (gastroesophageal reflux disease)    yrs ago  . H/O hiatal hernia   . History of hypogonadism   . History of kidney stones   . Hyperlipidemia   . Hypertension   . OSA on CPAP    "mask is broken; working on getting a new one" (01/15/2015; 10/03/2017)  . Pneumonia    "3-4 times" (10/03/2017)  . Urinary incontinence      FAMILY HISTORY        Family History  Problem Relation Age of Onset  . Hypertension  Mother   . Diabetes Mother   . Hypertension Father   . Diabetes Father   . Hyperlipidemia Father     SOCIAL HISTORY:   Social History        Socioeconomic History  . Marital status: Divorced    Spouse name: n/a  . Number of children: 0  . Years of education: 12th grade  . Highest education level: Not on file  Occupational History  . Occupation: Data processing manager ---self employed    Employer: [employer]  Social Needs  . Financial resource strain: Not on file  . Food insecurity:    Worry: Not on file    Inability: Not on file  . Transportation needs:    Medical: Not on file    Non-medical: Not on file  Tobacco Use  . Smoking status: Never Smoker  . Smokeless tobacco: Current User    Types: Chew  Substance and Sexual Activity  . Alcohol use: Yes    Alcohol/week: 3.0 standard drinks    Types: 3 Glasses of wine per week  . Drug use: No  . Sexual activity: Not Currently    Partners: Female  Lifestyle  . Physical activity:  Days per week: Not on file    Minutes per session: Not on file  . Stress: Not on file  Relationships  . Social connections:    Talks on phone: Not on file    Gets together: Not on file    Attends religious service: Not on file    Active member of club or organization: Not on file    Attends meetings of clubs or organizations: Not on file    Relationship status: Not on file  . Intimate partner violence:    Fear of current or ex partner: Not on file    Emotionally abused: Not on file    Physically abused: Not on file    Forced sexual activity: Not on file  Other Topics Concern  . Not on file  Social History Narrative   Exercise-- walking    Lives alone.   Brother lives in [place], [place]:    No Known Allergies  CURRENT MEDICATIONS:          Current Outpatient Medications  Medication Sig Dispense Refill  . ACCU-CHEK FASTCLIX  LANCETS MISC Use as directed once a day.  Dx code: E11.9 100 each 1  . albuterol (PROAIR HFA) 108 (90 Base) MCG/ACT inhaler Inhale 1-2 puffs into the lungs every 6 (six) hours as needed for wheezing or shortness of breath. 1 Inhaler 3  . apixaban (ELIQUIS) 5 MG TABS tablet Take 1 tablet (5 mg total) by mouth 2 (two) times daily. 180 tablet 3  . atorvastatin (LIPITOR) 40 MG tablet Take 1 tablet (40 mg total) by mouth at bedtime. 90 tablet 3  . blood glucose meter kit and supplies Dispense based on patient and insurance preference. Use as directed once a day. Dx Code E11.9. 1 each 0  . Blood Glucose Monitoring Suppl (ACCU-CHEK GUIDE) w/Device KIT 1 each by Does not apply route daily. DX Code: E11.9 1 kit 0  . carvedilol (COREG) 25 MG tablet Take 1 tablet (25 mg total) by mouth 2 (two) times daily with a meal. 60 tablet 3  . dapagliflozin propanediol (FARXIGA) 10 MG TABS tablet Take 10 mg by mouth daily. 30 tablet 3  . digoxin (LANOXIN) 0.125 MG tablet Take 0.5 tablets (0.0625 mg total) by mouth daily. 15 tablet 5  . furosemide (LASIX) 40 MG tablet Take 1 tablet (40 mg total) by mouth daily as needed. 30 tablet 11  . glucose blood (ACCU-CHEK GUIDE) test strip Use as directed once a day.  Dx code: E11.9 100 each 1  . guaiFENesin (MUCINEX) 600 MG 12 hr tablet Take 1 tablet (600 mg total) by mouth 2 (two) times daily as needed for to loosen phlegm.    Marland Kitchen HYDROcodone-homatropine (HYCODAN) 5-1.5 MG/5ML syrup Take 5 mLs by mouth every 6 (six) hours as needed for cough. 120 mL 0  . Icosapent Ethyl (VASCEPA) 1 g CAPS Take 2 capsules (2 g total) by mouth 2 (two) times daily. 120 capsule 6  . metFORMIN (GLUCOPHAGE) 500 MG tablet Take 1 tablet (500 mg total) by mouth 2 (two) times daily with a meal. 180 tablet 0  . mometasone-formoterol (DULERA) 200-5 MCG/ACT AERO Inhale 2 puffs into the lungs 2 (two) times daily. Further refills per PCP 1 Inhaler 0  . nitroGLYCERIN (NITROSTAT) 0.4 MG SL tablet Place 1 tablet  (0.4 mg total) under the tongue every 5 (five) minutes as needed for chest pain. 25 tablet 3  . predniSONE (DELTASONE) 10 MG tablet Take 32mx2weeks,20mgx2weeks,then 184maily 180  tablet 1  . spironolactone (ALDACTONE) 25 MG tablet TAKE 1 TABLET BY MOUTH DAILY. 30 tablet 5  . [START ON 07/11/2018] sacubitril-valsartan (ENTRESTO) 49-51 MG Take 1 tablet by mouth 2 (two) times daily. 60 tablet 3   No current facility-administered medications for this visit.     REVIEW OF SYSTEMS:   _0  denotes positive finding, _1  denotes negative finding Cardiac  Comments:  Chest pain or chest pressure:    Shortness of breath upon exertion: x   Short of breath when lying flat: x   Irregular heart rhythm:        Vascular    Pain in calf, thigh, or hip brought on by ambulation:    Pain in feet at night that wakes you up from your sleep:     Blood clot in your veins:    Leg swelling:         Pulmonary    Oxygen at home:    Productive cough:     Wheezing:  x       Neurologic    Sudden weakness in arms or legs:     Sudden numbness in arms or legs:     Sudden onset of difficulty speaking or slurred speech:    Temporary loss of vision in one eye:     Problems with dizziness:         Gastrointestinal    Blood in stool:      Vomited blood:         Genitourinary    Burning when urinating:     Blood in urine:        Psychiatric    Major depression:         Hematologic    Bleeding problems:    Problems with blood clotting too easily:        Skin    Rashes or ulcers:        Constitutional    Fever or chills:     PHYSICAL EXAM:       Vitals:   07/10/18 1633 07/10/18 1645  BP: (!) 84/59 (!) 78/52  Pulse: 87   SpO2: 98%   Weight: 212 lb 3.2 oz (96.3 kg)     GENERAL: The patient is a well-nourished male, in no acute distress. The vital signs are documented above. CARDIAC: There is  a regular rate and rhythm.  VASCULAR: I used the ultrasound to evaluate his carotid bifurcation which is in the mid neck.  There is no significant calcification. PULMONARY: Nonlabored respirations MUSCULOSKELETAL: There are no major deformities or cyanosis. NEUROLOGIC: No focal weakness or paresthesias are detected. SKIN: There are no ulcers or rashes noted. PSYCHIATRIC: The patient has a normal affect.  STUDIES:   I have reviewed his ultrasound which reveals 1-39% bilateral carotid stenosis.  The bifurcation is mid neck.  ASSESSMENT and PLAN   I have evaluated the patient for the Barostim trial.  He meets surgical criteria to proceed.  I discussed the details of the operation with the patient including the incisions the recovery.  And the risk of infection.  All of his questions were answered.  He will be randomized tomorrow.   Annamarie Major, MD [facility] [phone] Pager [phone]

## 2018-07-24 NOTE — Op Note (Signed)
    Patient name: Vincent Chandler MRN: [id number] DOB: May 27, 1961 Sex: male  07/24/2018 Pre-operative Diagnosis: CHF Post-operative diagnosis:  Same Surgeon:  Annamarie Major Assistants:  Laurence Slate Procedure:   Insertion of Barostim Neo deveice Anesthesia:  General Blood Loss:  Minimal Specimens:  none  Findings:  Normal anatomy  Indications: The patient has been screened and evaluated for a Barostim device.  He was randomized to the device arm.  He comes in today for his procedure.  Procedure:  The patient was identified in the holding area and taken to [facility]  The patient was then placed supine on the table. general anesthesia was administered.  The patient was prepped and draped in the usual sterile fashion.  A time out was called and antibiotics were administered.  I first used ultrasound to identify the carotid bifurcation and marked this with a ink pen.  A transverse 3.5 cm incision was made in a skin crease in the right neck.  Cautery was used about subcutaneous tissue.  The platysma muscle was divided with cautery.  I then used sharp dissection and identified the facial vein which was divided between silk ties.  I then identified the common carotid artery and dissected distally until I identified the carotid bifurcation.  There was minimal manipulation of the neural tissue at the carotid bifurcation.  An 18-gauge needle was then inserted into the infraclavicular space and the generator was connected to the circuit.  The electrode was then placed on the carotid bifurcation in position A and the device was turned on.  We had an excellent response.  I sutured the device down with a 6-0 Prolene at the 4:00 and 5:00 and 7 o'clock position.  We then tested the device again and had an excellent response.  The buckle was then cut and removed.  Additional anchoring sutures were placed at 11:00 8:00 and 1:00.  The wound was then irrigated with antibiotic solution.  The 18-gauge needle was removed  from the infraclavicular space.  I then made a transverse incision 1 fingerbreadth below the clavicle on the right chest.  Cautery was used about subcutaneous tissue down to the pectoral fascia.  I then used cautery to create a pocket for the generator.  Once the pocket was completed, I used a tonsil to create a tunnel between the 2 incisions.  The tubing for the device was then brought through the tunnel.  I then used two 6-0 Prolene sutures to create the strain relief loop, sutured to the common carotid artery.  The tubing was then connected to the generator.  The generator was placed into the pocket and Ethibond anchoring sutures were placed in the pocket.  The remaining portion of the tubing was placed on the lateral side of the device.  The wound was then copiously irrigated with antibiotic solution.  I inspected both wounds for hemostasis.  Once I was satisfied with hemostasis, the neck incision was closed by reapproximating the platysma with 3-0 Vicryl and the skin with 4-0 Vicryl.  The infraclavicular incision was closed by reapproximating the fascia with 3-0 Vicryl and the skin with 4-0 Vicryl.  Dermabond was applied.  There were no immediate complications.  The patient was successfully extubated and taken to recovery in stable condition.   Disposition: To PACU stable   V. Annamarie Major, M.D. [facility] [place] Office: [phone] Pager:  [phone]

## 2018-07-24 NOTE — Anesthesia Postprocedure Evaluation (Signed)
Anesthesia Post Note  Patient: Vincent Chandler  Procedure(s) Performed: INSERTION OF [name] (Right Neck)     Patient location during evaluation: PACU Anesthesia Type: General Level of consciousness: awake and alert Pain management: pain level controlled Vital Signs Assessment: post-procedure vital signs reviewed and stable Respiratory status: spontaneous breathing, nonlabored ventilation, respiratory function stable and patient connected to nasal cannula oxygen Cardiovascular status: blood pressure returned to baseline and stable Postop Assessment: no apparent nausea or vomiting Anesthetic complications: no    Last Vitals:  Vitals:   07/24/18 1024 07/24/18 1030  BP: 137/72   Pulse: 86 84  Resp: 14 (!) 8  Temp:    SpO2: (!) 88% 96%    Last Pain:  Vitals:   07/24/18 1030  TempSrc:   PainSc: [place]

## 2018-07-24 NOTE — Progress Notes (Signed)
Pt arrived from PACU. Oriented to room and equipment. CHG completed. VSS. Telemetry applied, CCMD notified. Incisions on R neck and R chest are clean, dry, intact, closed with dermabond and without drainage. Family at bedside. Call bell within reach. Will continue to monitor.   Fritz Pickerel, RN

## 2018-07-25 LAB — CBC
HCT: 43 % (ref 39.0–52.0)
Hemoglobin: 13.9 g/dL (ref 13.0–17.0)
MCH: 38.3 pg — ABNORMAL HIGH (ref 26.0–34.0)
MCHC: 32.3 g/dL (ref 30.0–36.0)
MCV: 118.5 fL — ABNORMAL HIGH (ref 80.0–100.0)
Platelets: 180 10*3/uL (ref 150–400)
RBC: 3.63 MIL/uL — ABNORMAL LOW (ref 4.22–5.81)
RDW: 15 % (ref 11.5–15.5)
WBC: 8.9 10*3/uL (ref 4.0–10.5)
nRBC: 0 % (ref 0.0–0.2)

## 2018-07-25 LAB — BASIC METABOLIC PANEL
ANION GAP: 10 (ref 5–15)
BUN: 37 mg/dL — ABNORMAL HIGH (ref 6–20)
CO2: 21 mmol/L — ABNORMAL LOW (ref 22–32)
Calcium: 8.9 mg/dL (ref 8.9–10.3)
Chloride: 107 mmol/L (ref 98–111)
Creatinine, Ser: 1.57 mg/dL — ABNORMAL HIGH (ref 0.61–1.24)
GFR calc Af Amer: 56 mL/min — ABNORMAL LOW (ref >60)
GFR, EST NON AFRICAN AMERICAN: 49 mL/min — AB (ref >60)
Glucose, Bld: 246 mg/dL — ABNORMAL HIGH (ref 70–99)
Potassium: 5.4 mmol/L — ABNORMAL HIGH (ref 3.5–5.1)
Sodium: 138 mmol/L (ref 135–145)

## 2018-07-25 LAB — GLUCOSE, CAPILLARY: Glucose-Capillary: 210 mg/dL — ABNORMAL HIGH (ref 70–99)

## 2018-07-25 MED ORDER — MUPIROCIN 2 % EX OINT
1.0000 "application " | TOPICAL_OINTMENT | Freq: Two times a day (BID) | CUTANEOUS | Status: DC
  Filled 2018-07-25: qty 22

## 2018-07-25 MED ORDER — CHLORHEXIDINE GLUCONATE CLOTH 2 % EX PADS: 6.0000 | MEDICATED_PAD | Freq: Every day | CUTANEOUS | Status: DC

## 2018-07-25 MED ORDER — OXYCODONE HCL 5 MG PO TABS: 5.0000 mg | ORAL_TABLET | ORAL | 0 refills | Status: DC | PRN

## 2018-07-25 MED FILL — oxyCODONE HCL 5 MG TABS: 5 | 2 days supply | Qty: 12 | Fill #0

## 2018-07-25 NOTE — Progress Notes (Addendum)
[facility] [place]  Subjective  - Doing well with a complaint of incisional pain.   Objective 131/74 90 (!) 97.5 F (36.4 C) (Oral) 15 91%  Intake/Output Summary (Last 24 hours) at 07/25/2018 0719 Last data filed at 07/25/2018 0300 Gross per 24 hour  Intake 1490 ml  Output 1950 ml  Net -460 ml    Right neck incision healing well without hematoma Right anterior chest incision healing well without hematoma No tongue deviation and smile is symmetric BP stable systolic 157'W Heart RRR  Assessment/Planning: POD # 1 Insertion of Barostim Neo deveice  Plan to follow up with Barostim team Pain medication sent to his pharmacy Stable disposition. Hyperkalemia will hold spironolactone per pharmacy recommendation until f/u out patient visit.  [name] 07/25/2018 7:19 AM --  Laboratory Lab Results: Recent Labs    07/24/18 0619 07/25/18 0231  WBC 5.6 8.9  HGB 13.7 13.9  HCT 43.0 43.0  PLT 156 180   BMET Recent Labs    07/24/18 0619 07/25/18 0231  NA 138 138  K 4.3 5.4*  CL 109 107  CO2 19* 21*  GLUCOSE 195* 246*  BUN 35* 37*  CREATININE 1.37* 1.57*  CALCIUM 9.1 8.9    COAG Lab Results  Component Value Date   INR 1.04 07/24/2018   INR 1.06 10/03/2017   INR 1.32 08/17/2017   No results found for: PTT  Agree with the above Neuro intact Incisions all clean and dry OK for discharge  Vincent Chandler

## 2018-07-29 ENCOUNTER — Other Ambulatory Visit (HOSPITAL_COMMUNITY): Payer: Self-pay

## 2018-07-29 NOTE — Discharge Summary (Signed)
[facility] Specialists Discharge Summary   Patient ID:  Vincent Chandler MRN: [id number] DOB/AGE: 10/18/1960 58 y.o.  Admit date: 07/24/2018 Discharge date: 07/25/2018 Date of Surgery: 07/24/2018 Surgeon: Surgeon(s): Serafina Mitchell, MD  Admission Diagnosis: congestive heart failure  Discharge Diagnoses:  congestive heart failure  Secondary Diagnoses: Past Medical History:  Diagnosis Date  . AICD (automatic cardioverter/defibrillator) present   . Anginal pain ([place])   . Anxiety   . Asthma   . Atrial fibrillation-postoperative 11/28/2012  . Cardiomyopathy    alcohol use related  . CHF (congestive heart failure) ([place])   . Chronic back pain   . Coronary artery disease    drug eluting stent RCA 2005-EF 30%- s/p CABG x 4; 2/4 patent grafts with SVG-PLOM and SVG-RCA system totally occluded. There are collaterals from the LAD system to the RCA and the RCA is totally occluded proximally. There are no collaterals to the LCx territory. He then underwent successful PCI of the mid left circumflex artery with overlapping Synergy drug-eluting stents  . Depression    pt denies  . Diabetes mellitus type II dx'd in the 1990's  . GERD (gastroesophageal reflux disease)    yrs ago  . H/O hiatal hernia   . Hemorrhoids   . History of hypogonadism   . History of kidney stones   . Hyperlipidemia   . Hypertension   . OSA on CPAP    "mask is broken; working on getting a new one" (01/15/2015; 10/03/2017)  . Pneumonia    "3-4 times" (10/03/2017)  . Urinary incontinence     Procedure(s): INSERTION OF BAROSTIM  Discharged Condition: good  HPI:  Vincent Hannis Welbornis a 58 y.o.male, who isbeing considered for the Barostim trial.The patient has interstitial lung disease. He suffers from coronary artery disease, status post CABG and PCI. He has an ICD in place on the left side. He is on Eliquis. He is medically managed for hypertension and hyperlipidemia. He is a diabetic and has a history  of atrial fibrillation. He is a non-smoker.  Dr. Trula Slade has evaluated the patient for theBarostim trial.He meets surgical criteria to proceed.  Hospital Course:  Vincent Chandler is a 58 y.o. male is S/P  Procedure(s): INSERTION OF BAROSTIM POD # 1 Insertion of Barostim Neo deveice  Plan to follow up with Barostim team Pain medication sent to his pharmacy Stable disposition. Hyperkalemia will hold spironolactone per pharmacy recommendation until f/u out patient visit.   Significant Diagnostic Studies: CBC Lab Results  Component Value Date   WBC 8.9 07/25/2018   HGB 13.9 07/25/2018   HCT 43.0 07/25/2018   MCV 118.5 (H) 07/25/2018   PLT 180 07/25/2018    BMET    Component Value Date/Time   NA 138 07/25/2018 0231   NA 138 07/10/2018 1700   K 5.4 (H) 07/25/2018 0231   CL 107 07/25/2018 0231   CO2 21 (L) 07/25/2018 0231   GLUCOSE 246 (H) 07/25/2018 0231   BUN 37 (H) 07/25/2018 0231   BUN 30 (H) 07/10/2018 1700   CREATININE 1.57 (H) 07/25/2018 0231   CREATININE 1.63 (H) 03/22/2016 1454   CALCIUM 8.9 07/25/2018 0231   GFRNONAA 49 (L) 07/25/2018 0231   GFRAA 56 (L) 07/25/2018 0231   COAG Lab Results  Component Value Date   INR 1.04 07/24/2018   INR 1.06 10/03/2017   INR 1.32 08/17/2017     Disposition:  Discharge to :Home Discharge Instructions    Call MD for:  redness, tenderness,  or signs of infection (pain, swelling, bleeding, redness, odor or green/yellow discharge around incision site)   Complete by:  As directed    Call MD for:  severe or increased pain, loss or decreased feeling  in affected limb(s)   Complete by:  As directed    Call MD for:  temperature >100.5   Complete by:  As directed    Resume previous diet   Complete by:  As directed      Allergies as of 07/25/2018   No Known Allergies     Medication List    STOP taking these medications   spironolactone 25 MG tablet Commonly known as:  ALDACTONE     TAKE these medications    ACCU-CHEK FASTCLIX LANCETS Misc Use as directed once a day.  Dx code: E11.9   ACCU-CHEK GUIDE w/Device Kit 1 each by Does not apply route daily. DX Code: E11.9   acetaminophen 500 MG tablet Commonly known as:  TYLENOL Take 1,000 mg by mouth daily as needed for moderate pain or headache.   albuterol 108 (90 Base) MCG/ACT inhaler Commonly known as:  PROAIR HFA Inhale 1-2 puffs into the lungs every 6 (six) hours as needed for wheezing or shortness of breath.   apixaban 5 MG Tabs tablet Commonly known as:  ELIQUIS Take 1 tablet (5 mg total) by mouth 2 (two) times daily.   atorvastatin 40 MG tablet Commonly known as:  LIPITOR Take 1 tablet (40 mg total) by mouth at bedtime.   blood glucose meter kit and supplies Dispense based on patient and insurance preference. Use as directed once a day. Dx Code E11.9.   carvedilol 25 MG tablet Commonly known as:  COREG Take 1 tablet (25 mg total) by mouth 2 (two) times daily with a meal.   digoxin 0.125 MG tablet Commonly known as:  LANOXIN Take 0.5 tablets (0.0625 mg total) by mouth daily.   furosemide 40 MG tablet Commonly known as:  LASIX Take 1 tablet (40 mg total) by mouth daily as needed. What changed:  reasons to take this   glucose blood test strip Commonly known as:  ACCU-CHEK GUIDE Use as directed once a day.  Dx code: E11.9   guaiFENesin 600 MG 12 hr tablet Commonly known as:  MUCINEX Take 1 tablet (600 mg total) by mouth 2 (two) times daily as needed for to loosen phlegm.   HYDROcodone-homatropine 5-1.5 MG/5ML syrup Commonly known as:  HYCODAN Take 5 mLs by mouth every 6 (six) hours as needed for cough.   metFORMIN 500 MG tablet Commonly known as:  GLUCOPHAGE Take 1 tablet (500 mg total) by mouth 2 (two) times daily with a meal.   mometasone-formoterol 200-5 MCG/ACT Aero Commonly known as:  DULERA Inhale 2 puffs into the lungs 2 (two) times daily. Further refills per PCP What changed:    when to take  this  reasons to take this   nitroGLYCERIN 0.4 MG SL tablet Commonly known as:  NITROSTAT Place 1 tablet (0.4 mg total) under the tongue every 5 (five) minutes as needed for chest pain.   oxyCODONE 5 MG immediate release tablet Commonly known as:  Oxy IR/ROXICODONE Take 1-2 tablets (5-10 mg total) by mouth every 4 (four) hours as needed for moderate pain.   predniSONE 10 MG tablet Commonly known as:  DELTASONE Take 32mx2weeks,20mgx2weeks,then 122maily What changed:    how much to take  how to take this  when to take this  additional instructions   sacubitril-valsartan 49-51  MG Commonly known as:  ENTRESTO Take 1 tablet by mouth 2 (two) times daily.      Verbal and written Discharge instructions given to the patient. Wound care per Discharge AVS   Signed: [name] 07/29/2018, 12:21 PM

## 2018-08-07 ENCOUNTER — Other Ambulatory Visit (HOSPITAL_COMMUNITY): Payer: Self-pay

## 2018-08-07 MED ORDER — SACUBITRIL-VALSARTAN 49-51 MG PO TABS: 1.0000 | ORAL_TABLET | Freq: Two times a day (BID) | ORAL | 0 refills | Status: DC

## 2018-08-09 ENCOUNTER — Telehealth (HOSPITAL_COMMUNITY): Payer: Self-pay | Admitting: Licensed Clinical Social Worker

## 2018-08-09 MED FILL — predniSONE 10 MG TABS: 10 | 30 days supply | Qty: 30 | Fill #5

## 2018-08-09 MED FILL — DIGOXIN 0.125 MG TABLET: 125 | 30 days supply | Qty: 15 | Fill #5

## 2018-08-09 NOTE — Telephone Encounter (Signed)
CSW faxed completed [employer] application for [employer] assistance for review- fax confirmation received  CSW will continue to follow and assist as needed  Jorge Ny, LCSW Clinical Social Worker [facility] [phone]

## 2018-08-12 NOTE — Research (Signed)
Pt came in today Feeling a little drained and full. Pt said he was going to take an extra Lasix today.  BP is low.  Adjusted device, walked the hallway pt felt ok.  Will see patient back in 2 weeks.

## 2018-08-16 ENCOUNTER — Other Ambulatory Visit (HOSPITAL_COMMUNITY): Payer: Self-pay | Admitting: Cardiology

## 2018-08-16 MED FILL — ATORVASTATIN 40 MG TABLET: 40 | 30 days supply | Qty: 30 | Fill #4 | Status: TO

## 2018-08-16 MED FILL — CARVEDILOL 25 MG TABLET: 25 | 30 days supply | Qty: 60 | Fill #0 | Status: TO

## 2018-08-19 ENCOUNTER — Other Ambulatory Visit (HOSPITAL_COMMUNITY): Payer: Self-pay

## 2018-08-19 ENCOUNTER — Telehealth (HOSPITAL_COMMUNITY): Payer: Self-pay | Admitting: Licensed Clinical Social Worker

## 2018-08-19 MED ORDER — SACUBITRIL-VALSARTAN 49-51 MG PO TABS: 1.0000 | ORAL_TABLET | Freq: Two times a day (BID) | ORAL | 0 refills | Status: DC

## 2018-08-19 NOTE — Telephone Encounter (Signed)
CSW received notification from [employer] that pt is not currently eligible for Entresto assistance through them  CSW informed pt and pt gave verbal permission to apply for [employer] for him- grant approved:  Member ID: [id number] Group ID: [id number] RxBin ID: [id number] PCN: [id number] Eligibility Start Date: 05/21/2018 Eligibility End Date: 08/19/2019 Assistance Amount: $1,000.00  Information provided to pharmacy  CSW will continue to follow and assist as needed  Jorge Ny, [place] Worker [facility] [phone]

## 2018-08-21 ENCOUNTER — Ambulatory Visit (HOSPITAL_COMMUNITY)
Admission: RE | Admit: 2018-08-21 | Discharge: 2018-08-21 | Disposition: A | Discharge status: Home / Self Care | Payer: Medicare HMO | Source: Ambulatory Visit | Attending: Internal Medicine | Admitting: Internal Medicine

## 2018-08-21 ENCOUNTER — Encounter: Payer: Medicare HMO | Admitting: *Deleted

## 2018-08-21 ENCOUNTER — Other Ambulatory Visit (HOSPITAL_COMMUNITY): Payer: Self-pay | Admitting: *Deleted

## 2018-08-21 VITALS — BP 90/54 | HR 96 | Wt 209.2 lb

## 2018-08-21 DIAGNOSIS — T8149XA Infection following a procedure, other surgical site, initial encounter: Principal | ICD-10-CM

## 2018-08-21 DIAGNOSIS — I5022 Chronic systolic (congestive) heart failure: Secondary | ICD-10-CM | POA: Diagnosis not present

## 2018-08-21 DIAGNOSIS — T8141XA Infection following a procedure, superficial incisional surgical site, initial encounter: Secondary | ICD-10-CM

## 2018-08-21 DIAGNOSIS — Z006 Encounter for examination for normal comparison and control in clinical research program: Secondary | ICD-10-CM

## 2018-08-21 LAB — BASIC METABOLIC PANEL
Anion gap: 14 (ref 5–15)
BUN: 26 mg/dL — ABNORMAL HIGH (ref 6–20)
CO2: 24 mmol/L (ref 22–32)
Calcium: 9.5 mg/dL (ref 8.9–10.3)
Chloride: 102 mmol/L (ref 98–111)
Creatinine, Ser: 1.45 mg/dL — ABNORMAL HIGH (ref 0.61–1.24)
GFR calc non Af Amer: 53 mL/min — ABNORMAL LOW (ref >60)
Glucose, Bld: 234 mg/dL — ABNORMAL HIGH (ref 70–99)
Potassium: 4.2 mmol/L (ref 3.5–5.1)
SODIUM: 140 mmol/L (ref 135–145)

## 2018-08-21 MED ORDER — CEPHALEXIN 500 MG PO CAPS: 500.0000 mg | ORAL_CAPSULE | Freq: Three times a day (TID) | ORAL | 0 refills | Status: DC

## 2018-08-21 MED ORDER — CEPHALEXIN 500 MG PO CAPS: 500.0000 mg | ORAL_CAPSULE | Freq: Three times a day (TID) | ORAL | 0 refills | Status: AC

## 2018-08-21 MED FILL — CEPHALEXIN 500 MG CAPSULE: 500 | 7 days supply | Qty: 21 | Fill #0

## 2018-08-21 NOTE — Patient Instructions (Signed)
Will call with results of BMP and CBC.  Start Keflex antibiotics today for 7 days as directed.  Will see you back in 1 week 08/28/2018 @ 3pm

## 2018-08-21 NOTE — Research (Addendum)
4 week beat visit  Pt seen today doing ok, two day ago his neck started to hurt, it was tender to touch. In clinic we observed his neck, it was red, tender to touch and hard above the incision. Picture was sent to Dr Trula Slade he said it looked like a stitch abscess and to start him on Keflex 500 mg TID.  We will see patient back in one week. Instructed patient to not shave around area or touch area. We told him that if he spiked a fever or started to have drainage please call research office and go to ER. Pt verbalize understood. Labs were drawn today.

## 2018-08-21 NOTE — Progress Notes (Signed)
bmet drawn in research per Dr [name], order placed

## 2018-08-22 ENCOUNTER — Telehealth: Payer: Self-pay | Admitting: *Deleted

## 2018-08-22 LAB — CBC
Hematocrit: 41 % (ref 37.5–51.0)
Hemoglobin: 14.3 g/dL (ref 13.0–17.7)
MCH: 38.2 pg — ABNORMAL HIGH (ref 26.6–33.0)
MCHC: 34.9 g/dL (ref 31.5–35.7)
MCV: 110 fL — ABNORMAL HIGH (ref 79–97)
NRBC: 1 % — ABNORMAL HIGH (ref 0–0)
Platelets: 204 10*3/uL (ref 150–450)
RBC: 3.74 x10E6/uL — ABNORMAL LOW (ref 4.14–5.80)
RDW: 13.8 % (ref 11.6–15.4)
WBC: 6.3 10*3/uL (ref 3.4–10.8)

## 2018-08-22 MED ORDER — SPIRONOLACTONE 25 MG PO TABS: 12.5000 mg | ORAL_TABLET | Freq: Every day | ORAL | 2 refills | Status: DC

## 2018-08-22 MED FILL — SPIRONOLACTONE 25 MG TABLET: 25 | 30 days supply | Qty: 15 | Fill #0

## 2018-08-22 NOTE — Progress Notes (Signed)
Pt started on Keflex 500 mg TID coming back in one week for incision check

## 2018-08-22 NOTE — Telephone Encounter (Signed)
Labs reviewed by Dr [name]  K+ is good we will restart spironolactone 12.5 mg daily.  Will see patient back in research office next Wednesday for incision check  Pt is aware of all blood results.

## 2018-08-23 ENCOUNTER — Telehealth (HOSPITAL_COMMUNITY): Payer: Self-pay

## 2018-08-23 NOTE — Telephone Encounter (Signed)
-----  Message from Larey Dresser, MD sent at 08/21/2018  8:08 PM EST ----- Stable creatinine, glucose is high

## 2018-08-23 NOTE — Telephone Encounter (Signed)
Spoke with pt regarding results, advised pt to follow up with PCP for elevated CBG. Results sent to PCP

## 2018-08-26 ENCOUNTER — Other Ambulatory Visit (HOSPITAL_COMMUNITY): Payer: Self-pay | Admitting: Student

## 2018-08-26 ENCOUNTER — Other Ambulatory Visit (HOSPITAL_COMMUNITY): Payer: Self-pay | Admitting: *Deleted

## 2018-08-26 DIAGNOSIS — E1151 Type 2 diabetes mellitus with diabetic peripheral angiopathy without gangrene: Secondary | ICD-10-CM

## 2018-08-26 DIAGNOSIS — I5022 Chronic systolic (congestive) heart failure: Secondary | ICD-10-CM

## 2018-08-27 ENCOUNTER — Other Ambulatory Visit (HOSPITAL_COMMUNITY): Payer: Self-pay | Admitting: Student

## 2018-08-27 DIAGNOSIS — E1151 Type 2 diabetes mellitus with diabetic peripheral angiopathy without gangrene: Secondary | ICD-10-CM

## 2018-08-28 ENCOUNTER — Encounter: Payer: Medicare HMO | Admitting: *Deleted

## 2018-08-28 DIAGNOSIS — T8141XA Infection following a procedure, superficial incisional surgical site, initial encounter: Secondary | ICD-10-CM

## 2018-08-28 DIAGNOSIS — T8149XA Infection following a procedure, other surgical site, initial encounter: Principal | ICD-10-CM

## 2018-08-28 MED ORDER — CEPHALEXIN 500 MG PO CAPS: 500.0000 mg | ORAL_CAPSULE | Freq: Three times a day (TID) | ORAL | 0 refills | Status: DC

## 2018-08-28 MED ORDER — CARVEDILOL 12.5 MG PO TABS: 18.7500 mg | ORAL_TABLET | Freq: Two times a day (BID) | ORAL | 3 refills | Status: DC

## 2018-08-28 MED FILL — CEPHALEXIN 500 MG CAPSULE: 500 | 7 days supply | Qty: 21 | Fill #0

## 2018-08-28 NOTE — Research (Addendum)
Ordering 7 more days of Keflex 500 mg TID and decreasing coreg to 18.75 mg BID Pt said there is no more pain or tenderness, that site feels much better. Site looks better. Dr Trula Slade saw pt while here.

## 2018-08-29 ENCOUNTER — Other Ambulatory Visit (HOSPITAL_COMMUNITY): Payer: Self-pay

## 2018-08-29 ENCOUNTER — Other Ambulatory Visit: Payer: Self-pay | Admitting: *Deleted

## 2018-08-29 DIAGNOSIS — E1151 Type 2 diabetes mellitus with diabetic peripheral angiopathy without gangrene: Secondary | ICD-10-CM

## 2018-08-29 MED ORDER — METFORMIN HCL 500 MG PO TABS: 500.0000 mg | ORAL_TABLET | Freq: Two times a day (BID) | ORAL | 0 refills | Status: DC

## 2018-08-29 MED ORDER — CARVEDILOL 25 MG PO TABS: 25.0000 mg | ORAL_TABLET | Freq: Two times a day (BID) | ORAL | 3 refills | Status: DC

## 2018-08-29 MED FILL — metFORMIN HCL 500 MG TABS: 500 | 30 days supply | Qty: 60 | Fill #0

## 2018-08-30 ENCOUNTER — Encounter (HOSPITAL_COMMUNITY): Payer: Self-pay | Admitting: Cardiology

## 2018-08-30 ENCOUNTER — Ambulatory Visit (HOSPITAL_COMMUNITY): Admission: RE | Admit: 2018-08-30 | Payer: Medicare HMO | Source: Ambulatory Visit

## 2018-09-10 ENCOUNTER — Other Ambulatory Visit (HOSPITAL_COMMUNITY): Payer: Self-pay | Admitting: Cardiology

## 2018-09-10 MED FILL — FUROSEMIDE 40 MG TAB: 40 | 30 days supply | Qty: 30 | Fill #2

## 2018-09-10 MED FILL — DIGOXIN 0.125 MG TABLET: 125 | 30 days supply | Qty: 15 | Fill #0 | Status: TO

## 2018-09-11 ENCOUNTER — Other Ambulatory Visit: Payer: Self-pay

## 2018-09-11 ENCOUNTER — Encounter: Payer: Medicare HMO | Admitting: *Deleted

## 2018-09-11 VITALS — BP 83/58 | HR 121 | Wt 211.0 lb

## 2018-09-11 DIAGNOSIS — Z006 Encounter for examination for normal comparison and control in clinical research program: Secondary | ICD-10-CM

## 2018-09-11 NOTE — Research (Signed)
Patient doing well, no complaints of cp, sob, or swelling.  Weight is about the same checks it at home some. Incision on neck looks much better, no tenderness to touch, site is not warm to touch. No pain in the neck area.  BP is still low but feels fine.  Adjusted monitor today, pt went for a walk felt great, BP went up.  Will check him again in 2 weeks.  Told patient to call with any dizziness/lightheadedness.

## 2018-09-16 MED FILL — ENTRESTO 49 MG-51 MG TABLET: 49-51 | 30 days supply | Qty: 60 | Fill #0

## 2018-09-16 MED FILL — predniSONE 10 MG TABS: 10 | 30 days supply | Qty: 30 | Fill #6 | Status: TO

## 2018-09-25 ENCOUNTER — Encounter: Payer: Medicare HMO | Admitting: *Deleted

## 2018-09-25 ENCOUNTER — Other Ambulatory Visit: Payer: Self-pay

## 2018-09-25 DIAGNOSIS — Z006 Encounter for examination for normal comparison and control in clinical research program: Secondary | ICD-10-CM

## 2018-09-25 NOTE — Research (Signed)
Pt concerned with coming to clinic for visit due to CODVID-19. Pt visit done on the phone. Pt state he is feeling good with no complaints. Pt stated only medication change is Carvedilol decreased 18.75 on 26/FEB/2020. No STEM noted from pt.Instructed pt to call Joelene Millin or Dr.Mclean if he start having any problems. Pt verbalized understanding. Next appointment scheduled for April.

## 2018-09-26 ENCOUNTER — Other Ambulatory Visit: Payer: Self-pay | Admitting: Family Medicine

## 2018-09-26 ENCOUNTER — Telehealth (HOSPITAL_COMMUNITY): Payer: Self-pay | Admitting: *Deleted

## 2018-09-26 DIAGNOSIS — E1151 Type 2 diabetes mellitus with diabetic peripheral angiopathy without gangrene: Secondary | ICD-10-CM

## 2018-09-26 MED FILL — CARVEDILOL 25 MG TABLET: 25 | 30 days supply | Qty: 60 | Fill #0

## 2018-09-26 MED FILL — ATORVASTATIN 40 MG TABLET: 40 | 30 days supply | Qty: 30 | Fill #0

## 2018-09-26 NOTE — Telephone Encounter (Signed)
Pt needs samples of eliquis, aware to come by and pick up samples  Medication Samples have been provided to the patient.  Drug name: Eliquis       Strength: 5        Qty: 1 LOT: [id number]  Exp.Date: 6/22  Dosing instructions: take 1 tab Twice daily   The patient has been instructed regarding the correct time, dose, and frequency of taking this medication, including desired effects and most common side effects.   Jenella Craigie 3:03 PM 09/26/2018

## 2018-09-27 MED FILL — metFORMIN HCL 500 MG TABS: 500 | 15 days supply | Qty: 30 | Fill #0

## 2018-10-08 ENCOUNTER — Encounter (HOSPITAL_COMMUNITY): Payer: Self-pay | Admitting: Cardiology

## 2018-10-08 ENCOUNTER — Ambulatory Visit (HOSPITAL_COMMUNITY): Payer: Medicare HMO

## 2018-10-09 ENCOUNTER — Other Ambulatory Visit (HOSPITAL_COMMUNITY): Payer: Self-pay | Admitting: Student

## 2018-10-09 MED FILL — ELIQUIS 5 MG TABLET: 5 | 30 days supply | Qty: 60 | Fill #0

## 2018-10-09 MED FILL — predniSONE 10 MG TABS: 10 | 30 days supply | Qty: 30 | Fill #0

## 2018-10-09 MED FILL — DIGOXIN 0.125 MG TABLET: 125 | 30 days supply | Qty: 15 | Fill #0

## 2018-10-14 ENCOUNTER — Telehealth (HOSPITAL_COMMUNITY): Payer: Self-pay

## 2018-10-14 ENCOUNTER — Ambulatory Visit (HOSPITAL_COMMUNITY)
Admission: RE | Admit: 2018-10-14 | Discharge: 2018-10-14 | Disposition: A | Discharge status: Home / Self Care | Payer: Medicare HMO | Source: Ambulatory Visit | Attending: Cardiology | Admitting: Cardiology

## 2018-10-14 ENCOUNTER — Other Ambulatory Visit: Payer: Self-pay

## 2018-10-14 DIAGNOSIS — I5022 Chronic systolic (congestive) heart failure: Secondary | ICD-10-CM

## 2018-10-14 MED ORDER — SPIRONOLACTONE 25 MG PO TABS: 25.0000 mg | ORAL_TABLET | Freq: Every day | ORAL | 3 refills | Status: DC

## 2018-10-14 MED ORDER — CARVEDILOL 12.5 MG PO TABS: 25.0000 mg | ORAL_TABLET | Freq: Two times a day (BID) | ORAL | 3 refills | Status: DC

## 2018-10-14 MED ORDER — FUROSEMIDE 20 MG PO TABS: 20.0000 mg | ORAL_TABLET | Freq: Every day | ORAL | 3 refills | Status: DC | PRN

## 2018-10-14 MED ORDER — PREDNISONE 10 MG PO TABS: 10.0000 mg | ORAL_TABLET | Freq: Every day | ORAL | 3 refills | Status: DC

## 2018-10-14 MED FILL — SPIRONOLACTONE 25 MG TABS: 25 | 90 days supply | Qty: 90 | Fill #0

## 2018-10-14 MED FILL — FUROSEMIDE 20 MG TABS: 20 | 90 days supply | Qty: 90 | Fill #0

## 2018-10-14 MED FILL — CARVEDILOL 12.5 MG TABLET: 12.5 | 45 days supply | Qty: 180 | Fill #0

## 2018-10-14 NOTE — Progress Notes (Signed)
Heart Failure TeleHealth Note  Due to national recommendations of social distancing due to [place] 19, Audio/video telehealth visit is felt to be most appropriate for this patient at this time.  See MyChart message from today for patient consent regarding telehealth for [facility].  Date:  10/14/2018   ID:  Vincent Chandler, DOB 09-01-1960, MRN [id number]  Location: Home  Provider location: [facility] Type of Visit: Established patient  PCP:  Default, Provider, MD  Cardiologist: Dr. [name]  Chief Complaint: Exertional shortness of breath.   History of Present Illness: Vincent Chandler is a 58 y.o. male who presents via audio/video conferencing for a telehealth visit today.     he denies symptoms worrisome for COVID 19.   Patient has a history of A flutter, chronic systolic heart failure, CAD s/p CABG, CKD Stage III, DMII, OSA.   Admitted 08/07/17 with atrial flutter/RVR and acute on chronic systolic heart failure. Underwent successful DC/CV on 2/8. Required short term milrinone but was able to wean off. HF meds started. Echo this admission with EF 15%. D/C weight 201 pounds.  CPX in 2/19 showed severe functional impairment due to heart failure. However, PFTs were restrictive and high resolution CT chest was concerning for interstitial lung disease.   He has had atrial flutter ablation.   He had RHC in 4/19 showing preserved cardiac output and low filling pressures, but moderate pulmonary hypertension.   He saw pulmonary regarding interstitial lung disease and had bronchoscopy.  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  He has been started on prednisone, cough is much improved on prednisone.    He saw Dr. Caryl Comes, not planning to upgrade ICD to CRT given IVCD but not LBBB-like.   He had a Barostim device placed in 1/20.   I tried him on [place] but he developed orthostasis after starting it and had to stop.   He is stable today.   Weight is down 8-10 lbs at home.  He is taking Lasix maybe one time a week.  He has an ongoing chronic cough, this was thought by pulmonary in the past to be related to amiodarone lung toxicity.  He is still on prednisone 10 mg daily for amiodarone lung toxicity.  He is not lightheaded unless he squats down then stands up.  At times, he notes dyspnea with moderate exertion.  At other times, he has no significant dyspnea.  No chest pain.  No orthopnea/PND.    Labs (2/19): free T4 increased but free T3 normal, LDL 86, HDL 30 Labs (3/19): K 4.9, creatinine 1.53, hgb 14.3, LDL 52, ANA negative, RF negative Labs (4/19): K 4.2, creatinine 1.27 Labs (5/19): LDL 69, K 4.4, creatinine 1.36 Labs (8/19): K 5.3, creatinine 2.2 Labs (10/19): K 5, creatinine 1.49 Labs (12/19): hgb 13.4 Labs (2/20): K 4.2, creatinine 1.45, hgb 14.3  PMH:  1. Atrial fibrillation: Noted post-op CABG.  2. Atrial flutter: Noted during 2/19 admission, DCCV done.  S/p Ablation 3/19.  3. CAD: s/p CABG.  - LHC (1/16):  Patent LIMA-LAD and SVG-D. 80% mLCx and 99% PLOM, total occlusion of SVG-PLOM.  Total occlusion of RCA with occlusion of SVG-RCA.  Patient had DES to mLCx-PLOM.  4. HTN 5. Type II diabetes  6. Hyperlipidemia 7. Chronic systolic CHF: Ischemic cardiomyopathy.   - Echo (2/19): EF 15%, mildly dilated LV, mild LVH, inferolateral akinesis, milr MR, mildly dilated RV with severely reduced systolic function.  -  CPX (2/19): Peak VO2 12.2, VE/VCO2 slope 53, RER 1.07 => severe limitation from heart failure.  - Medtronic ICD  - RHC (4/19): mean RA 5, PA 54/18 mean 31, mean PCWP 12, CI 2.5, PVR 3.6 WU - Echo (1/20): EF 20% - Barostimulator device placed in 1/20.  8. Interstitial lung disease: PFTs (2/19) were restrictive, raising concern for ILD.  - High resolution CT chest in 2/19: interstitial lung disease concerning for usual interstitial pneumonitis. - Bronchoscopy was done, possible amiodarone lung toxicity causing ILD,  now on prednisone.  9. CKD stage 3 10. OSA 11. ABIs (2/19): not significantly abnormal 12. Carotid dopplers (2/19): Mild disease only.  - Carotid dopplers (1/20): Minimal stenosis.  13. Pulmonary hypertension: ?Group 3 due to ILD.  14. Nephrolithiasis   Current Outpatient Medications  Medication Sig Dispense Refill  . ACCU-CHEK FASTCLIX LANCETS MISC Use as directed once a day.  Dx code: E11.9 100 each 1  . acetaminophen (TYLENOL) 500 MG tablet Take 1,000 mg by mouth daily as needed for moderate pain or headache.    . albuterol (PROAIR HFA) 108 (90 Base) MCG/ACT inhaler Inhale 1-2 puffs into the lungs every 6 (six) hours as needed for wheezing or shortness of breath. 1 Inhaler 3  . atorvastatin (LIPITOR) 40 MG tablet Take 1 tablet (40 mg total) by mouth at bedtime. 90 tablet 3  . blood glucose meter kit and supplies Dispense based on patient and insurance preference. Use as directed once a day. Dx Code E11.9. 1 each 0  . Blood Glucose Monitoring Suppl (ACCU-CHEK GUIDE) w/Device KIT 1 each by Does not apply route daily. DX Code: E11.9 1 kit 0  . carvedilol (COREG) 12.5 MG tablet Take 2 tablets (25 mg total) by mouth 2 (two) times daily with a meal. 180 tablet 3  . cephALEXin (KEFLEX) 500 MG capsule Take 1 capsule (500 mg total) by mouth 3 (three) times daily between meals. 21 capsule 0  . digoxin (LANOXIN) 0.125 MG tablet TAKE 1/2 TABLET BY MOUTH DAILY. 15 tablet 5  . ELIQUIS 5 MG TABS tablet TAKE 1 TABLET BY MOUTH 2 TIMES DAILY. 60 tablet 5  . furosemide (LASIX) 20 MG tablet Take 1 tablet (20 mg total) by mouth daily as needed. 90 tablet 3  . glucose blood (ACCU-CHEK GUIDE) test strip Use as directed once a day.  Dx code: E11.9 100 each 1  . guaiFENesin (MUCINEX) 600 MG 12 hr tablet Take 1 tablet (600 mg total) by mouth 2 (two) times daily as needed for to loosen phlegm.    Marland Kitchen HYDROcodone-homatropine (HYCODAN) 5-1.5 MG/5ML syrup Take 5 mLs by mouth every 6 (six) hours as needed for cough.  (Patient not taking: Reported on 07/22/2018) 120 mL 0  . metFORMIN (GLUCOPHAGE) 500 MG tablet TAKE 1 TABLET (500 MG TOTAL) BY MOUTH 2 (TWO) TIMES DAILY WITH A MEAL. 30 tablet 0  . mometasone-formoterol (DULERA) 200-5 MCG/ACT AERO Inhale 2 puffs into the lungs 2 (two) times daily. Further refills per PCP (Patient taking differently: Inhale 2 puffs into the lungs 2 (two) times daily as needed for shortness of breath. Further refills per PCP) 1 Inhaler 0  . nitroGLYCERIN (NITROSTAT) 0.4 MG SL tablet Place 1 tablet (0.4 mg total) under the tongue every 5 (five) minutes as needed for chest pain. 25 tablet 3  . oxyCODONE (OXY IR/ROXICODONE) 5 MG immediate release tablet Take 1-2 tablets (5-10 mg total) by mouth every 4 (four) hours as needed for moderate pain. 12 tablet 0  .  predniSONE (DELTASONE) 10 MG tablet Take 1 tablet (10 mg total) by mouth daily. 90 tablet 3  . sacubitril-valsartan (ENTRESTO) 49-51 MG Take 1 tablet by mouth 2 (two) times daily. 60 tablet 0  . spironolactone (ALDACTONE) 25 MG tablet Take 1 tablet (25 mg total) by mouth daily. 90 tablet 3   No current facility-administered medications for this encounter.     Allergies:   Patient has no known allergies.   Social History:  The patient  reports that he has never smoked. His smokeless tobacco use includes chew. He reports current alcohol use of about 3.0 standard drinks of alcohol per week. He reports that he does not use drugs.   Family History:  The patient's family history includes Diabetes in his father and mother; Hyperlipidemia in his father; Hypertension in his father and mother.   ROS:  Please see the history of present illness.   All other systems are personally reviewed and negative.   Exam:  (Video/Tele Health Call; Exam is subjective and or/visual.) BP 104/75, P 103 General:  Speaks in full sentences. No resp difficulty. Neck: Thick, JVP difficult Lungs: Normal respiratory effort with conversation.  Abdomen:  Non-distended per patient report Extremities: Pt denies edema. Neuro: Alert & oriented x 3.   Recent Labs: 07/24/2018: ALT 29 08/21/2018: BUN 26; Creatinine, Ser 1.45; Hemoglobin 14.3; Platelets 204; Potassium 4.2; Sodium 140  Personally reviewed   Wt Readings from Last 3 Encounters:  09/11/18 95.7 kg (211 lb)  08/28/18 95.3 kg (210 lb)  08/21/18 94.9 kg (209 lb 3.2 oz)      ASSESSMENT AND PLAN:  1. Chronic systolic CHF: Ischemic cardiomyopathy. Medtronic ICD single chamber. 2/19 admission for decompensated HF requiring milrinone. Echo 2/7/019 EF 15% Mildly dilated RV. CPX in 2/19 was suggestive of severe limitation due to HF.   RHC in 4/19 showed normal filling pressures and preserved cardiac output.  Echo in 1/20 with EF 20%.  He had a Barostimulator device placed in 1/20.  Currently NYHA II-III symptoms.  At times, he is more short of breath. - Continue Coreg 12.5 mg bid (dose decreased due to orthostasis).   - Continue digoxin 0.0625 mg daily, check digoxin level.  - Continue Entresto 49/51 bid.  - Increase spironolactone to 25 mg daily.    - I will have him take Lasix 20 mg daily.  BMET in 1 week.  - Has IVCD, not LBBB-like.  Saw Dr. Caryl Comes, will not upgrade ICD to CRT.  - He may be a future LVAD candidate.   - He will need echo when we are able to do elective studies again.  2. Afib/ Atrial flutter: Paroxysmal.  Admitted in 2/19 with severe HF decompensation in setting of atrial flutter with RVR.  He was cardioverted.  He then had atrial flutter ablation in 3/19.  He does not have palpitations though HR around 100 today on his measure.  He is off amiodarone due to question of amiodarone lung toxicity.  - He is now off amiodarone (question of lung toxicity, would not re-challenge in future).   - Continue Eliquis 5 mg bid. Denies bleeding.  3. CAD: S/p CABG.  Last intervention was PCI to LCx in 2016.  No chest pain.   - Continue atorvastatin, check lipids with labs in 1 week.  - He  was supposed to start on Vascepa after last lipid panel but did not.  Will see what triglycerides look like on lipid check in 1 week.  - No ASA  with Eliquis use.  4. Pulmonary fibrosis: PFTs in 2/19 were restrictive, and high resolution CT showed interstitial lung disease. Amiodarone was stopped due to concern for possible toxicity.  He has seen [name] => bronchoscopy suggestive of ILD related to amiodarone.  He is now on prednisone 10 mg daily.  Still has a nagging chronic cough.  - He has not had followup wiwth [name], would like him seen to determine when he can come off prednisone.  5. OSA: Does not have CPAP, needs to reschedule sleep study.   6. CKD: Stage 3.  - BMET in 1 week.    7. Pulmonary hypertension: Suspect group 3 PH due to lung disease (ILD).  8. DM2: He did not tolerate [place]   COVID screen The patient does not have any symptoms that suggest any further testing/ screening at this time.  Social distancing reinforced today.  Relevant cardiac medications were reviewed at length with the patient today.   The patient does not have concerns regarding their medications at this time.   Recommended follow-up:  1 month via telehealth  Today, I have spent 23 minutes with the patient with telehealth technology discussing the above issues .    Signed, Loralie Champagne, MD  10/14/2018 10:58 PM  [facility] [address] [facility] [place] [address] [phone] (office) [phone] (fax)

## 2018-10-14 NOTE — Patient Instructions (Addendum)
Increase Spironolactone to 25 mg daily.   Start taking Lasix 20 mg daily   Medications sent to [facility].  Please follow up with lab work Monday,  4/20 @ 11:30    You have been referred to Pulmonology, this office will call and schedule an appointment with you.   Telehealth followup 1 month, this has been scheduled 29 June _0 :40

## 2018-10-14 NOTE — Telephone Encounter (Signed)
VM not set up, AVS mailed

## 2018-10-16 ENCOUNTER — Other Ambulatory Visit: Payer: Self-pay | Admitting: Family Medicine

## 2018-10-16 DIAGNOSIS — E1151 Type 2 diabetes mellitus with diabetic peripheral angiopathy without gangrene: Secondary | ICD-10-CM

## 2018-10-16 MED FILL — ENTRESTO 49 MG-51 MG TABLET: 49-51 | 30 days supply | Qty: 60 | Fill #0

## 2018-10-16 MED FILL — metFORMIN HCL 500 MG TABS: 500 | 15 days supply | Qty: 30 | Fill #0

## 2018-10-16 NOTE — Telephone Encounter (Signed)
Dr. Etter Sjogren, no pcp in patients chart. Last seen in 10/2017. Please advise on refilling of medication. Schedule webex?

## 2018-10-18 ENCOUNTER — Ambulatory Visit (INDEPENDENT_AMBULATORY_CARE_PROVIDER_SITE_OTHER): Payer: Medicare HMO | Admitting: *Deleted

## 2018-10-18 DIAGNOSIS — I5022 Chronic systolic (congestive) heart failure: Secondary | ICD-10-CM | POA: Diagnosis not present

## 2018-10-18 DIAGNOSIS — N183 Chronic kidney disease, stage 3 unspecified: Secondary | ICD-10-CM

## 2018-10-19 LAB — CUP PACEART REMOTE DEVICE CHECK
Battery Remaining Longevity: 107 mo
Battery Voltage: 3.01 V
Brady Statistic RV Percent Paced: 0 %
Date Time Interrogation Session: 20200418141859
HighPow Impedance: 78 Ohm
Implantable Lead Implant Date: 20160715
Implantable Lead Location: 753860
Implantable Lead Model: 293
Implantable Lead Serial Number: 382379
Implantable Pulse Generator Implant Date: 20160715
Lead Channel Impedance Value: 456 Ohm
Lead Channel Impedance Value: 494 Ohm
Lead Channel Pacing Threshold Amplitude: 0.75 V
Lead Channel Pacing Threshold Pulse Width: 0.4 ms
Lead Channel Sensing Intrinsic Amplitude: 8.875 mV
Lead Channel Sensing Intrinsic Amplitude: 8.875 mV
Lead Channel Setting Pacing Amplitude: 2.5 V
Lead Channel Setting Pacing Pulse Width: 0.4 ms
Lead Channel Setting Sensing Sensitivity: 0.3 mV

## 2018-10-21 ENCOUNTER — Other Ambulatory Visit (HOSPITAL_COMMUNITY): Payer: Self-pay

## 2018-10-23 NOTE — Progress Notes (Signed)
Remote ICD transmission.   

## 2018-11-04 ENCOUNTER — Other Ambulatory Visit: Payer: Self-pay | Admitting: Family Medicine

## 2018-11-04 DIAGNOSIS — E1151 Type 2 diabetes mellitus with diabetic peripheral angiopathy without gangrene: Secondary | ICD-10-CM

## 2018-11-04 MED FILL — ATORVASTATIN 40 MG TABLET: 40 | 30 days supply | Qty: 30 | Fill #1

## 2018-11-04 MED FILL — DIGOXIN 0.125 MG TABLET: 125 | 30 days supply | Qty: 15 | Fill #1 | Status: TO

## 2018-11-05 MED FILL — metFORMIN HCL 500 MG TABS: 500 | 30 days supply | Qty: 60 | Fill #0

## 2018-11-08 MED FILL — ELIQUIS 5 MG TABLET: 5 | 30 days supply | Qty: 60 | Fill #1 | Status: TO

## 2018-11-14 ENCOUNTER — Encounter: Payer: Medicare HMO | Admitting: *Deleted

## 2018-11-14 ENCOUNTER — Other Ambulatory Visit: Payer: Self-pay

## 2018-11-14 ENCOUNTER — Ambulatory Visit (HOSPITAL_COMMUNITY)
Admission: RE | Admit: 2018-11-14 | Discharge: 2018-11-14 | Disposition: A | Discharge status: Home / Self Care | Payer: Medicare HMO | Source: Ambulatory Visit | Attending: Cardiology | Admitting: Cardiology

## 2018-11-14 VITALS — BP 103/65 | HR 79 | Temp 96.8°F | Wt 209.8 lb

## 2018-11-14 DIAGNOSIS — I5022 Chronic systolic (congestive) heart failure: Secondary | ICD-10-CM | POA: Diagnosis not present

## 2018-11-14 DIAGNOSIS — Z006 Encounter for examination for normal comparison and control in clinical research program: Secondary | ICD-10-CM

## 2018-11-14 LAB — CBC
HCT: 42.9 % (ref 39.0–52.0)
Hemoglobin: 13.8 g/dL (ref 13.0–17.0)
MCH: 36.9 pg — ABNORMAL HIGH (ref 26.0–34.0)
MCHC: 32.2 g/dL (ref 30.0–36.0)
MCV: 114.7 fL — ABNORMAL HIGH (ref 80.0–100.0)
Platelets: 180 10*3/uL (ref 150–400)
RBC: 3.74 MIL/uL — ABNORMAL LOW (ref 4.22–5.81)
RDW: 14.6 % (ref 11.5–15.5)
WBC: 7 10*3/uL (ref 4.0–10.5)
nRBC: 0.6 % — ABNORMAL HIGH (ref 0.0–0.2)

## 2018-11-14 LAB — BASIC METABOLIC PANEL
Anion gap: 9 (ref 5–15)
BUN: 31 mg/dL — ABNORMAL HIGH (ref 6–20)
CO2: 24 mmol/L (ref 22–32)
Calcium: 8.7 mg/dL — ABNORMAL LOW (ref 8.9–10.3)
Chloride: 105 mmol/L (ref 98–111)
Creatinine, Ser: 1.81 mg/dL — ABNORMAL HIGH (ref 0.61–1.24)
GFR calc Af Amer: 47 mL/min — ABNORMAL LOW (ref >60)
GFR calc non Af Amer: 41 mL/min — ABNORMAL LOW (ref >60)
Glucose, Bld: 219 mg/dL — ABNORMAL HIGH (ref 70–99)
Potassium: 4.2 mmol/L (ref 3.5–5.1)
Sodium: 138 mmol/L (ref 135–145)

## 2018-11-14 LAB — LIPID PANEL
Cholesterol: 130 mg/dL (ref 0–200)
HDL: 35 mg/dL — ABNORMAL LOW (ref >40)
LDL Cholesterol: 16 mg/dL (ref 0–99)
Total CHOL/HDL Ratio: 3.7 RATIO
Triglycerides: 396 mg/dL — ABNORMAL HIGH (ref <150)
VLDL: 79 mg/dL — ABNORMAL HIGH (ref 0–40)

## 2018-11-14 LAB — DIGOXIN LEVEL: Digoxin Level: 0.6 ng/mL — ABNORMAL LOW (ref 0.8–2.0)

## 2018-11-15 ENCOUNTER — Telehealth (HOSPITAL_COMMUNITY): Payer: Self-pay

## 2018-11-15 DIAGNOSIS — I5022 Chronic systolic (congestive) heart failure: Secondary | ICD-10-CM

## 2018-11-15 MED ORDER — ICOSAPENT ETHYL 1 G PO CAPS: 2.0000 g | ORAL_CAPSULE | Freq: Two times a day (BID) | ORAL | 2 refills | Status: DC

## 2018-11-15 MED FILL — predniSONE 10 MG TABS: 10 | 90 days supply | Qty: 90 | Fill #0

## 2018-11-15 MED FILL — ENTRESTO 49 MG-51 MG TABLET: 49-51 | 30 days supply | Qty: 60 | Fill #1

## 2018-11-15 MED FILL — VASCEPA 1 GM CAPSULE: 1 | 30 days supply | Qty: 120 | Fill #0

## 2018-11-15 NOTE — Telephone Encounter (Signed)
-----  Message from Larey Dresser, MD sent at 11/15/2018  3:22 PM EDT ----- Excellent LDL. Triglycerides too high. He should start Vascepa 2 g bid, lipids in 3 months.

## 2018-11-15 NOTE — Telephone Encounter (Signed)
Pt aware of lab results. Pt advised of new medicine to lower Triglycerides. Pt reports understanding.  Appt card mailed.  Rx sent to pharmacy at [facility] long to be mailed to patient.

## 2018-12-02 ENCOUNTER — Telehealth: Payer: Self-pay | Admitting: *Deleted

## 2018-12-02 MED FILL — DIGOXIN 0.125 MG TABLET: 125 | 30 days supply | Qty: 15 | Fill #0

## 2018-12-02 NOTE — Telephone Encounter (Signed)
Received call from Vincent Chandler this afternoon. He stated that he was having breathing problems more than normal, especially with exertion.Marland Kitchen   He stated that he went to drive this morning and he got dizzy and had like a white out episode, he said that he didn't not pass out, he still has the little cough, that he has had for a while now since like end of year, nothing new.  He feels more fatigue than normal.  I asked if he had fluid on board and he said his weight was the same no change.   I asked about his sugar control and he don't check his sugar at home, last checked with his Bmet in May and it was in the 200s.  He said that he was going to call his eye dr which will be new to him and get see him to get checked out.  I asked him to check his BP especially in the mornings, if they dizziness if happening more so in the morning time.  Vincent Chandler stated that he would like to follow up with Dr Chase Caller for an appointment.  Will forward this message to Dr [name] and Dr Chase Caller for advice.

## 2018-12-03 ENCOUNTER — Ambulatory Visit (HOSPITAL_COMMUNITY)
Admission: RE | Admit: 2018-12-03 | Discharge: 2018-12-03 | Disposition: A | Discharge status: Home / Self Care | Payer: Medicare HMO | Source: Ambulatory Visit | Attending: Cardiology | Admitting: Cardiology

## 2018-12-03 ENCOUNTER — Encounter (HOSPITAL_COMMUNITY): Payer: Self-pay | Admitting: Cardiology

## 2018-12-03 ENCOUNTER — Other Ambulatory Visit: Payer: Self-pay

## 2018-12-03 ENCOUNTER — Telehealth (HOSPITAL_COMMUNITY): Payer: Self-pay

## 2018-12-03 VITALS — BP 124/86 | HR 81 | Wt 207.2 lb

## 2018-12-03 DIAGNOSIS — N183 Chronic kidney disease, stage 3 unspecified: Secondary | ICD-10-CM

## 2018-12-03 DIAGNOSIS — E1122 Type 2 diabetes mellitus with diabetic chronic kidney disease: Secondary | ICD-10-CM | POA: Insufficient documentation

## 2018-12-03 DIAGNOSIS — Z720 Tobacco use: Secondary | ICD-10-CM | POA: Diagnosis not present

## 2018-12-03 DIAGNOSIS — Z951 Presence of aortocoronary bypass graft: Secondary | ICD-10-CM | POA: Insufficient documentation

## 2018-12-03 DIAGNOSIS — E785 Hyperlipidemia, unspecified: Secondary | ICD-10-CM | POA: Diagnosis not present

## 2018-12-03 DIAGNOSIS — Z7901 Long term (current) use of anticoagulants: Secondary | ICD-10-CM | POA: Diagnosis not present

## 2018-12-03 DIAGNOSIS — J8410 Pulmonary fibrosis, unspecified: Secondary | ICD-10-CM | POA: Diagnosis not present

## 2018-12-03 DIAGNOSIS — I2510 Atherosclerotic heart disease of native coronary artery without angina pectoris: Secondary | ICD-10-CM | POA: Diagnosis not present

## 2018-12-03 DIAGNOSIS — I255 Ischemic cardiomyopathy: Secondary | ICD-10-CM | POA: Insufficient documentation

## 2018-12-03 DIAGNOSIS — Z79899 Other long term (current) drug therapy: Secondary | ICD-10-CM | POA: Insufficient documentation

## 2018-12-03 DIAGNOSIS — I5023 Acute on chronic systolic (congestive) heart failure: Secondary | ICD-10-CM | POA: Diagnosis not present

## 2018-12-03 DIAGNOSIS — Z8249 Family history of ischemic heart disease and other diseases of the circulatory system: Secondary | ICD-10-CM | POA: Diagnosis not present

## 2018-12-03 DIAGNOSIS — I5022 Chronic systolic (congestive) heart failure: Secondary | ICD-10-CM

## 2018-12-03 DIAGNOSIS — Z95810 Presence of automatic (implantable) cardiac defibrillator: Secondary | ICD-10-CM | POA: Insufficient documentation

## 2018-12-03 DIAGNOSIS — R0989 Other specified symptoms and signs involving the circulatory and respiratory systems: Secondary | ICD-10-CM | POA: Diagnosis not present

## 2018-12-03 DIAGNOSIS — Z7984 Long term (current) use of oral hypoglycemic drugs: Secondary | ICD-10-CM | POA: Diagnosis not present

## 2018-12-03 DIAGNOSIS — I4892 Unspecified atrial flutter: Secondary | ICD-10-CM | POA: Insufficient documentation

## 2018-12-03 DIAGNOSIS — J849 Interstitial pulmonary disease, unspecified: Secondary | ICD-10-CM

## 2018-12-03 DIAGNOSIS — R05 Cough: Secondary | ICD-10-CM | POA: Insufficient documentation

## 2018-12-03 DIAGNOSIS — G4733 Obstructive sleep apnea (adult) (pediatric): Secondary | ICD-10-CM | POA: Diagnosis not present

## 2018-12-03 DIAGNOSIS — I2723 Pulmonary hypertension due to lung diseases and hypoxia: Secondary | ICD-10-CM | POA: Diagnosis not present

## 2018-12-03 DIAGNOSIS — I252 Old myocardial infarction: Secondary | ICD-10-CM | POA: Diagnosis not present

## 2018-12-03 DIAGNOSIS — I4891 Unspecified atrial fibrillation: Secondary | ICD-10-CM | POA: Insufficient documentation

## 2018-12-03 DIAGNOSIS — I130 Hypertensive heart and chronic kidney disease with heart failure and stage 1 through stage 4 chronic kidney disease, or unspecified chronic kidney disease: Secondary | ICD-10-CM | POA: Diagnosis not present

## 2018-12-03 LAB — BASIC METABOLIC PANEL
Anion gap: 11 (ref 5–15)
BUN: 32 mg/dL — ABNORMAL HIGH (ref 6–20)
CO2: 23 mmol/L (ref 22–32)
Calcium: 9.2 mg/dL (ref 8.9–10.3)
Chloride: 106 mmol/L (ref 98–111)
Creatinine, Ser: 1.79 mg/dL — ABNORMAL HIGH (ref 0.61–1.24)
GFR calc Af Amer: 48 mL/min — ABNORMAL LOW (ref >60)
GFR calc non Af Amer: 41 mL/min — ABNORMAL LOW (ref >60)
Glucose, Bld: 239 mg/dL — ABNORMAL HIGH (ref 70–99)
Potassium: 4.8 mmol/L (ref 3.5–5.1)
Sodium: 140 mmol/L (ref 135–145)

## 2018-12-03 LAB — PROTIME-INR
INR: 1.4 — ABNORMAL HIGH (ref 0.8–1.2)
Prothrombin Time: 17.3 seconds — ABNORMAL HIGH (ref 11.4–15.2)

## 2018-12-03 LAB — BRAIN NATRIURETIC PEPTIDE: B Natriuretic Peptide: 288.2 pg/mL — ABNORMAL HIGH (ref 0.0–100.0)

## 2018-12-03 MED ORDER — FUROSEMIDE 20 MG PO TABS: ORAL_TABLET | ORAL | 3 refills | Status: DC

## 2018-12-03 MED ORDER — FUROSEMIDE 20 MG PO TABS: 20.0000 mg | ORAL_TABLET | Freq: Every day | ORAL | 3 refills | Status: DC

## 2018-12-03 NOTE — Telephone Encounter (Signed)
-----  Message from Larey Dresser, MD sent at 12/03/2018  2:54 PM EDT ----- Pulmonary fibrosis.  Needs appointment soon with [name].

## 2018-12-03 NOTE — Telephone Encounter (Signed)
Ideally sooner, can he see a PA for Dr Chase Caller?

## 2018-12-03 NOTE — H&P (View-Only) (Signed)
   Date:  12/03/2018   ID:  Vincent Chandler, DOB 03/21/1961, MRN [id number]  Location: Home  Provider location: [facility] Type of Visit: Established patient  PCP:  Default, Provider, MD  Cardiologist: Dr. Anneth Brunell  Chief Complaint: Exertional shortness of breath.   History of Present Illness: Vincent Chandler is a 58 y.o. male with history of A flutter, chronic systolic heart failure, CAD s/p CABG, CKD Stage III, DMII, OSA who presents for followup of CHF and CAD.   Admitted 08/07/17 with atrial flutter/RVR and acute on chronic systolic heart failure. Underwent successful DC/CV on 2/8. Required short term milrinone but was able to wean off. HF meds started. Echo this admission with EF 15%. D/C weight 201 pounds.  CPX in 2/19 showed severe functional impairment due to heart failure. However, PFTs were restrictive and high resolution CT chest was concerning for interstitial lung disease.   He has had atrial flutter ablation.   He had RHC in 4/19 showing preserved cardiac output and low filling pressures, but moderate pulmonary hypertension.   He saw pulmonary regarding interstitial lung disease and had bronchoscopy.  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  He has been started on prednisone, cough is much improved on prednisone.    He saw Dr. Klein, not planning to upgrade ICD to CRT given IVCD but not LBBB-like.   He had a Barostim device placed in 1/20.   I tried him on Farxiga but he developed orthostasis after starting it and had to stop.   He has an ongoing chronic cough, this was thought by pulmonary in the past to be related to amiodarone lung toxicity.  He is still on prednisone 10 mg daily for amiodarone lung toxicity.    For the last week, he has been more symptomatic with increased cough and poor energy, "wears out" very easily.  Short of breath taking garbage out to street.  Weight is actually down 6 lbs but REDS clip measurement  today was 48%.  Oxygen saturation 90% on room air.  No chest pain, +orthopnea.  He feels like he wheezes occasionally.   ECG (personally reviewed): NSR, old inferior MI, old anterolateral MI, QRS 112 msec.    Labs (2/19): free T4 increased but free T3 normal, LDL 86, HDL 30 Labs (3/19): K 4.9, creatinine 1.53, hgb 14.3, LDL 52, ANA negative, RF negative Labs (4/19): K 4.2, creatinine 1.27 Labs (5/19): LDL 69, K 4.4, creatinine 1.36 Labs (8/19): K 5.3, creatinine 2.2 Labs (10/19): K 5, creatinine 1.49 Labs (12/19): hgb 13.4 Labs (2/20): K 4.2, creatinine 1.45, hgb 14.3 Labs (5/20): K 4.2, creatinine 1.81, digoxin 0.6, LDL 16, TGs 396, hgb 13.8  PMH:  1. Atrial fibrillation: Noted post-op CABG.  2. Atrial flutter: Noted during 2/19 admission, DCCV done.  S/p Ablation 3/19.  3. CAD: s/p CABG.  - LHC (1/16):  Patent LIMA-LAD and SVG-D. 80% mLCx and 99% PLOM, total occlusion of SVG-PLOM.  Total occlusion of RCA with occlusion of SVG-RCA.  Patient had DES to mLCx-PLOM.  4. HTN 5. Type II diabetes  6. Hyperlipidemia 7. Chronic systolic CHF: Ischemic cardiomyopathy.   - Echo (2/19): EF 15%, mildly dilated LV, mild LVH, inferolateral akinesis, milr MR, mildly dilated RV with severely reduced systolic function.  - CPX (2/19): Peak VO2 12.2, VE/VCO2 slope 53, RER 1.07 => severe limitation from heart failure.  - Medtronic ICD  - RHC (4/19): mean RA 5, PA 54/18 mean   31, mean PCWP 12, CI 2.5, PVR 3.6 WU - Echo (1/20): EF 20% - Barostimulator device placed in 1/20.  8. Interstitial lung disease: PFTs (2/19) were restrictive, raising concern for ILD.  - High resolution CT chest in 2/19: interstitial lung disease concerning for usual interstitial pneumonitis. - Bronchoscopy was done, possible amiodarone lung toxicity causing ILD, now on prednisone.  9. CKD stage 3 10. OSA 11. ABIs (2/19): not significantly abnormal 12. Carotid dopplers (2/19): Mild disease only.  - Carotid dopplers (1/20):  Minimal stenosis.  13. Pulmonary hypertension: ?Group 3 due to ILD.  14. Nephrolithiasis   Current Outpatient Medications  Medication Sig Dispense Refill  . ACCU-CHEK FASTCLIX LANCETS MISC Use as directed once a day.  Dx code: E11.9 100 each 1  . acetaminophen (TYLENOL) 500 MG tablet Take 1,000 mg by mouth daily as needed for moderate pain or headache.    . albuterol (PROAIR HFA) 108 (90 Base) MCG/ACT inhaler Inhale 1-2 puffs into the lungs every 6 (six) hours as needed for wheezing or shortness of breath. 1 Inhaler 3  . atorvastatin (LIPITOR) 40 MG tablet Take 1 tablet (40 mg total) by mouth at bedtime. 90 tablet 3  . blood glucose meter kit and supplies Dispense based on patient and insurance preference. Use as directed once a day. Dx Code E11.9. 1 each 0  . Blood Glucose Monitoring Suppl (ACCU-CHEK GUIDE) w/Device KIT 1 each by Does not apply route daily. DX Code: E11.9 1 kit 0  . carvedilol (COREG) 12.5 MG tablet Take 2 tablets (25 mg total) by mouth 2 (two) times daily with a meal. 180 tablet 3  . digoxin (LANOXIN) 0.125 MG tablet TAKE 1/2 TABLET BY MOUTH DAILY. 15 tablet 5  . ELIQUIS 5 MG TABS tablet TAKE 1 TABLET BY MOUTH 2 TIMES DAILY. 60 tablet 5  . furosemide (LASIX) 20 MG tablet Take 2 tablets (40 mg total) by mouth every morning AND 1 tablet (20 mg total) at bedtime. 270 tablet 3  . glucose blood (ACCU-CHEK GUIDE) test strip Use as directed once a day.  Dx code: E11.9 100 each 1  . guaiFENesin (MUCINEX) 600 MG 12 hr tablet Take 1 tablet (600 mg total) by mouth 2 (two) times daily as needed for to loosen phlegm.    . HYDROcodone-homatropine (HYCODAN) 5-1.5 MG/5ML syrup Take 5 mLs by mouth every 6 (six) hours as needed for cough. 120 mL 0  . Icosapent Ethyl 1 g CAPS Take 2 capsules (2 g total) by mouth 2 (two) times a day. 360 capsule 2  . metFORMIN (GLUCOPHAGE) 500 MG tablet TAKE 1 TABLET BY MOUTH 2 TIMES DAILY WITH A MEAL. 60 tablet 0  . mometasone-formoterol (DULERA) 200-5 MCG/ACT  AERO Inhale 2 puffs into the lungs 2 (two) times daily. Further refills per PCP 1 Inhaler 0  . nitroGLYCERIN (NITROSTAT) 0.4 MG SL tablet Place 1 tablet (0.4 mg total) under the tongue every 5 (five) minutes as needed for chest pain. 25 tablet 3  . oxyCODONE (OXY IR/ROXICODONE) 5 MG immediate release tablet Take 1-2 tablets (5-10 mg total) by mouth every 4 (four) hours as needed for moderate pain. 12 tablet 0  . predniSONE (DELTASONE) 10 MG tablet Take 1 tablet (10 mg total) by mouth daily. 90 tablet 3  . sacubitril-valsartan (ENTRESTO) 49-51 MG Take 1 tablet by mouth 2 (two) times daily. 60 tablet 0  . spironolactone (ALDACTONE) 25 MG tablet Take 1 tablet (25 mg total) by mouth daily. 90 tablet 3     No current facility-administered medications for this encounter.     Allergies:   Patient has no known allergies.   Social History:  The patient  reports that he has never smoked. His smokeless tobacco use includes chew. He reports current alcohol use of about 3.0 standard drinks of alcohol per week. He reports that he does not use drugs.   Family History:  The patient's family history includes Diabetes in his father and mother; Hyperlipidemia in his father; Hypertension in his father and mother.   ROS:  Please see the history of present illness.   All other systems are personally reviewed and negative.   Exam:   BP 124/86   Pulse 81   Wt 94 kg (207 lb 3.2 oz)   SpO2 90%   BMI 29.31 kg/m  General: NAD Neck: Thick, JVP 10, no thyromegaly or thyroid nodule.  Lungs: Dry crackles at bases bilaterally.  CV: Nondisplaced PMI.  Heart regular S1/S2, no S3/S4, no murmur.  No peripheral edema.  No carotid bruit.  Normal pedal pulses.  Abdomen: Soft, nontender, no hepatosplenomegaly, no distention.  Skin: Intact without lesions or rashes.  Neurologic: Alert and oriented x 3.  Psych: Normal affect. Extremities: No clubbing or cyanosis.  HEENT: Normal.   Recent Labs: 07/24/2018: ALT 29 11/14/2018:  Hemoglobin 13.8; Platelets 180 12/03/2018: B Natriuretic Peptide 288.2; BUN 32; Creatinine, Ser 1.79; Potassium 4.8; Sodium 140  Personally reviewed   Wt Readings from Last 3 Encounters:  12/03/18 94 kg (207 lb 3.2 oz)  11/14/18 95.2 kg (209 lb 12.8 oz)  09/11/18 95.7 kg (211 lb)      ASSESSMENT AND PLAN:  1. Chronic systolic CHF: Ischemic cardiomyopathy. Medtronic ICD single chamber. 2/19 admission for decompensated HF requiring milrinone. Echo 2/7/019 EF 15% Mildly dilated RV. CPX in 2/19 was suggestive of severe limitation due to HF.   RHC in 4/19 showed normal filling pressures and preserved cardiac output.  Echo in 1/20 with EF 20%.  He had a Barostimulator device placed in 1/20.  Currently NYHA III symptoms, worse for the last week or so.  Exam is difficult but I think that he is volume overloaded with REDS clip measurement 48%.  - Continue Coreg 12.5 mg bid (dose decreased due to orthostasis).   - Continue digoxin 0.0625 mg daily, check digoxin level.  - Continue Entresto 49/51 bid.  - Continue spironolactone 25 mg daily.    - Increase Lasix to 40 qam/20 qpm.  BMET today and again in 10 days.  - Has IVCD, not LBBB-like.  Saw Dr. Caryl Comes, will not upgrade ICD to CRT.  - He may be a future LVAD candidate.   - I am going to arrange for RHC next week to assess filling pressures and cardiac output.  Hold Eliquis day prior to study and day of study.  I discussed risks/benefits with patient and he agreed to procedure.  2. Afib/ Atrial flutter: Paroxysmal.  Admitted in 2/19 with severe HF decompensation in setting of atrial flutter with RVR.  He was cardioverted.  He then had atrial flutter ablation in 3/19.  He is off amiodarone due to question of amiodarone lung toxicity. He is in NSR today.  - He is now off amiodarone (question of lung toxicity, would not re-challenge in future).   - Continue Eliquis 5 mg bid. Denies bleeding.  3. CAD: S/p CABG.  Last intervention was PCI to LCx in 2016.  No  chest pain.   - Continue atorvastatin, good lipids in  5/20.  - Triglycerides high, now on Vascepa. Repeat lipids in 2 months.   - No ASA with Eliquis use.  4. Pulmonary fibrosis: PFTs in 2/19 were restrictive, and high resolution CT showed interstitial lung disease. Amiodarone was stopped due to concern for possible toxicity.  He has seen Dr. Ramaswamy => bronchoscopy suggestive of ILD related to amiodarone.  He is now on prednisone 10 mg daily.  Still with chronic cough and dry crackles on exam.  Dyspnea is likely a combination of CHF (which I think is worse currently) and ILD.  - He has not had followup with Dr. Ramaswamy, I think he needs re-evaluation and will see if pulmonary can get him in soon.  - CXR today.  5. OSA: Does not have CPAP, needs to reschedule sleep study.   6. CKD: Stage 3.  - BMET today and again in 10 days with increased Lasix.  7. Pulmonary hypertension: Suspect group 3 PH due to lung disease (ILD).  8. DM2: He did not tolerate Farxiga   Followup 1 month after RHC  Signed, Sashay Felling, MD  12/03/2018  [facility] [address] [facility] [place] [address] [phone] (office) [phone] (fax) 

## 2018-12-03 NOTE — Progress Notes (Signed)
Date:  12/03/2018   ID:  Vincent Chandler, DOB Nov 29, 1960, MRN [id number]  Location: Home  Provider location: [facility] Type of Visit: Established patient  PCP:  Default, Provider, MD  Cardiologist: Dr. [name]  Chief Complaint: Exertional shortness of breath.   History of Present Illness: Vincent Chandler is a 58 y.o. male with history of A flutter, chronic systolic heart failure, CAD s/p CABG, CKD Stage III, DMII, OSA who presents for followup of CHF and CAD.   Admitted 08/07/17 with atrial flutter/RVR and acute on chronic systolic heart failure. Underwent successful DC/CV on 2/8. Required short term milrinone but was able to wean off. HF meds started. Echo this admission with EF 15%. D/C weight 201 pounds.  CPX in 2/19 showed severe functional impairment due to heart failure. However, PFTs were restrictive and high resolution CT chest was concerning for interstitial lung disease.   He has had atrial flutter ablation.   He had RHC in 4/19 showing preserved cardiac output and low filling pressures, but moderate pulmonary hypertension.   He saw pulmonary regarding interstitial lung disease and had bronchoscopy.  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  He has been started on prednisone, cough is much improved on prednisone.    He saw Dr. Caryl Comes, not planning to upgrade ICD to CRT given IVCD but not LBBB-like.   He had a Barostim device placed in 1/20.   I tried him on [place] but he developed orthostasis after starting it and had to stop.   He has an ongoing chronic cough, this was thought by pulmonary in the past to be related to amiodarone lung toxicity.  He is still on prednisone 10 mg daily for amiodarone lung toxicity.    For the last week, he has been more symptomatic with increased cough and poor energy, "wears out" very easily.  Short of breath taking garbage out to street.  Weight is actually down 6 lbs but REDS clip measurement  today was 48%.  Oxygen saturation 90% on room air.  No chest pain, +orthopnea.  He feels like he wheezes occasionally.   ECG (personally reviewed): NSR, old inferior MI, old anterolateral MI, QRS 112 msec.    Labs (2/19): free T4 increased but free T3 normal, LDL 86, HDL 30 Labs (3/19): K 4.9, creatinine 1.53, hgb 14.3, LDL 52, ANA negative, RF negative Labs (4/19): K 4.2, creatinine 1.27 Labs (5/19): LDL 69, K 4.4, creatinine 1.36 Labs (8/19): K 5.3, creatinine 2.2 Labs (10/19): K 5, creatinine 1.49 Labs (12/19): hgb 13.4 Labs (2/20): K 4.2, creatinine 1.45, hgb 14.3 Labs (5/20): K 4.2, creatinine 1.81, digoxin 0.6, LDL 16, TGs 396, hgb 13.8  PMH:  1. Atrial fibrillation: Noted post-op CABG.  2. Atrial flutter: Noted during 2/19 admission, DCCV done.  S/p Ablation 3/19.  3. CAD: s/p CABG.  - LHC (1/16):  Patent LIMA-LAD and SVG-D. 80% mLCx and 99% PLOM, total occlusion of SVG-PLOM.  Total occlusion of RCA with occlusion of SVG-RCA.  Patient had DES to mLCx-PLOM.  4. HTN 5. Type II diabetes  6. Hyperlipidemia 7. Chronic systolic CHF: Ischemic cardiomyopathy.   - Echo (2/19): EF 15%, mildly dilated LV, mild LVH, inferolateral akinesis, milr MR, mildly dilated RV with severely reduced systolic function.  - CPX (2/19): Peak VO2 12.2, VE/VCO2 slope 53, RER 1.07 => severe limitation from heart failure.  - Medtronic ICD  - RHC (4/19): mean RA 5, PA 54/18 mean  31, mean PCWP 12, CI 2.5, PVR 3.6 WU - Echo (1/20): EF 20% - Barostimulator device placed in 1/20.  8. Interstitial lung disease: PFTs (2/19) were restrictive, raising concern for ILD.  - High resolution CT chest in 2/19: interstitial lung disease concerning for usual interstitial pneumonitis. - Bronchoscopy was done, possible amiodarone lung toxicity causing ILD, now on prednisone.  9. CKD stage 3 10. OSA 11. ABIs (2/19): not significantly abnormal 12. Carotid dopplers (2/19): Mild disease only.  - Carotid dopplers (1/20):  Minimal stenosis.  13. Pulmonary hypertension: ?Group 3 due to ILD.  14. Nephrolithiasis   Current Outpatient Medications  Medication Sig Dispense Refill  . ACCU-CHEK FASTCLIX LANCETS MISC Use as directed once a day.  Dx code: E11.9 100 each 1  . acetaminophen (TYLENOL) 500 MG tablet Take 1,000 mg by mouth daily as needed for moderate pain or headache.    . albuterol (PROAIR HFA) 108 (90 Base) MCG/ACT inhaler Inhale 1-2 puffs into the lungs every 6 (six) hours as needed for wheezing or shortness of breath. 1 Inhaler 3  . atorvastatin (LIPITOR) 40 MG tablet Take 1 tablet (40 mg total) by mouth at bedtime. 90 tablet 3  . blood glucose meter kit and supplies Dispense based on patient and insurance preference. Use as directed once a day. Dx Code E11.9. 1 each 0  . Blood Glucose Monitoring Suppl (ACCU-CHEK GUIDE) w/Device KIT 1 each by Does not apply route daily. DX Code: E11.9 1 kit 0  . carvedilol (COREG) 12.5 MG tablet Take 2 tablets (25 mg total) by mouth 2 (two) times daily with a meal. 180 tablet 3  . digoxin (LANOXIN) 0.125 MG tablet TAKE 1/2 TABLET BY MOUTH DAILY. 15 tablet 5  . ELIQUIS 5 MG TABS tablet TAKE 1 TABLET BY MOUTH 2 TIMES DAILY. 60 tablet 5  . furosemide (LASIX) 20 MG tablet Take 2 tablets (40 mg total) by mouth every morning AND 1 tablet (20 mg total) at bedtime. 270 tablet 3  . glucose blood (ACCU-CHEK GUIDE) test strip Use as directed once a day.  Dx code: E11.9 100 each 1  . guaiFENesin (MUCINEX) 600 MG 12 hr tablet Take 1 tablet (600 mg total) by mouth 2 (two) times daily as needed for to loosen phlegm.    Marland Kitchen HYDROcodone-homatropine (HYCODAN) 5-1.5 MG/5ML syrup Take 5 mLs by mouth every 6 (six) hours as needed for cough. 120 mL 0  . Icosapent Ethyl 1 g CAPS Take 2 capsules (2 g total) by mouth 2 (two) times a day. 360 capsule 2  . metFORMIN (GLUCOPHAGE) 500 MG tablet TAKE 1 TABLET BY MOUTH 2 TIMES DAILY WITH A MEAL. 60 tablet 0  . mometasone-formoterol (DULERA) 200-5 MCG/ACT  AERO Inhale 2 puffs into the lungs 2 (two) times daily. Further refills per PCP 1 Inhaler 0  . nitroGLYCERIN (NITROSTAT) 0.4 MG SL tablet Place 1 tablet (0.4 mg total) under the tongue every 5 (five) minutes as needed for chest pain. 25 tablet 3  . oxyCODONE (OXY IR/ROXICODONE) 5 MG immediate release tablet Take 1-2 tablets (5-10 mg total) by mouth every 4 (four) hours as needed for moderate pain. 12 tablet 0  . predniSONE (DELTASONE) 10 MG tablet Take 1 tablet (10 mg total) by mouth daily. 90 tablet 3  . sacubitril-valsartan (ENTRESTO) 49-51 MG Take 1 tablet by mouth 2 (two) times daily. 60 tablet 0  . spironolactone (ALDACTONE) 25 MG tablet Take 1 tablet (25 mg total) by mouth daily. 90 tablet 3  No current facility-administered medications for this encounter.     Allergies:   Patient has no known allergies.   Social History:  The patient  reports that he has never smoked. His smokeless tobacco use includes chew. He reports current alcohol use of about 3.0 standard drinks of alcohol per week. He reports that he does not use drugs.   Family History:  The patient's family history includes Diabetes in his father and mother; Hyperlipidemia in his father; Hypertension in his father and mother.   ROS:  Please see the history of present illness.   All other systems are personally reviewed and negative.   Exam:   BP 124/86   Pulse 81   Wt 94 kg (207 lb 3.2 oz)   SpO2 90%   BMI 29.31 kg/m  General: NAD Neck: Thick, JVP 10, no thyromegaly or thyroid nodule.  Lungs: Dry crackles at bases bilaterally.  CV: Nondisplaced PMI.  Heart regular S1/S2, no S3/S4, no murmur.  No peripheral edema.  No carotid bruit.  Normal pedal pulses.  Abdomen: Soft, nontender, no hepatosplenomegaly, no distention.  Skin: Intact without lesions or rashes.  Neurologic: Alert and oriented x 3.  Psych: Normal affect. Extremities: No clubbing or cyanosis.  HEENT: Normal.   Recent Labs: 07/24/2018: ALT 29 11/14/2018:  Hemoglobin 13.8; Platelets 180 12/03/2018: B Natriuretic Peptide 288.2; BUN 32; Creatinine, Ser 1.79; Potassium 4.8; Sodium 140  Personally reviewed   Wt Readings from Last 3 Encounters:  12/03/18 94 kg (207 lb 3.2 oz)  11/14/18 95.2 kg (209 lb 12.8 oz)  09/11/18 95.7 kg (211 lb)      ASSESSMENT AND PLAN:  1. Chronic systolic CHF: Ischemic cardiomyopathy. Medtronic ICD single chamber. 2/19 admission for decompensated HF requiring milrinone. Echo 2/7/019 EF 15% Mildly dilated RV. CPX in 2/19 was suggestive of severe limitation due to HF.   RHC in 4/19 showed normal filling pressures and preserved cardiac output.  Echo in 1/20 with EF 20%.  He had a Barostimulator device placed in 1/20.  Currently NYHA III symptoms, worse for the last week or so.  Exam is difficult but I think that he is volume overloaded with REDS clip measurement 48%.  - Continue Coreg 12.5 mg bid (dose decreased due to orthostasis).   - Continue digoxin 0.0625 mg daily, check digoxin level.  - Continue Entresto 49/51 bid.  - Continue spironolactone 25 mg daily.    - Increase Lasix to 40 qam/20 qpm.  BMET today and again in 10 days.  - Has IVCD, not LBBB-like.  Saw Dr. Caryl Comes, will not upgrade ICD to CRT.  - He may be a future LVAD candidate.   - I am going to arrange for RHC next week to assess filling pressures and cardiac output.  Hold Eliquis day prior to study and day of study.  I discussed risks/benefits with patient and he agreed to procedure.  2. Afib/ Atrial flutter: Paroxysmal.  Admitted in 2/19 with severe HF decompensation in setting of atrial flutter with RVR.  He was cardioverted.  He then had atrial flutter ablation in 3/19.  He is off amiodarone due to question of amiodarone lung toxicity. He is in NSR today.  - He is now off amiodarone (question of lung toxicity, would not re-challenge in future).   - Continue Eliquis 5 mg bid. Denies bleeding.  3. CAD: S/p CABG.  Last intervention was PCI to LCx in 2016.  No  chest pain.   - Continue atorvastatin, good lipids in  5/20.  - Triglycerides high, now on Vascepa. Repeat lipids in 2 months.   - No ASA with Eliquis use.  4. Pulmonary fibrosis: PFTs in 2/19 were restrictive, and high resolution CT showed interstitial lung disease. Amiodarone was stopped due to concern for possible toxicity.  He has seen [name] => bronchoscopy suggestive of ILD related to amiodarone.  He is now on prednisone 10 mg daily.  Still with chronic cough and dry crackles on exam.  Dyspnea is likely a combination of CHF (which I think is worse currently) and ILD.  - He has not had followup with [name], I think he needs re-evaluation and will see if pulmonary can get him in soon.  - CXR today.  5. OSA: Does not have CPAP, needs to reschedule sleep study.   6. CKD: Stage 3.  - BMET today and again in 10 days with increased Lasix.  7. Pulmonary hypertension: Suspect group 3 PH due to lung disease (ILD).  8. DM2: He did not tolerate Farxiga   Followup 1 month after RHC  Signed, Loralie Champagne, MD  12/03/2018  [facility] [address] [facility] [place] [address] [phone] (office) [phone] (fax)

## 2018-12-03 NOTE — Telephone Encounter (Signed)
Need to work Vincent Chandler in with me this week. Also try to make pulmonary appt with [name].

## 2018-12-03 NOTE — Telephone Encounter (Signed)
Called Dr Chase Caller office, they are able to arrange visit with PA tomorrow.  Patient made aware. Advised for him to call office to arrange time.  Office number provided. Pt appreciative.

## 2018-12-03 NOTE — Progress Notes (Signed)
ReDS Vest - 12/03/18 1100      ReDS Vest   MR   No    Fitting Posture  Sitting    Height Marker  Tall    Center Strip  Aligned

## 2018-12-03 NOTE — Patient Instructions (Addendum)
EKG was completed today.  Labs were done today. We will call you with any ABNORMAL results. No news is good news!  Please repeat lab work in 10 days.   ReDs clip was placed to evaluate the percentage of fluid in your chest, it was 48%.   You will need a chest x-ray today.  You have been referred to Pulmonology, this office will call to schedule an appointment with you.  INCREASE Lasix to 40 mg (2 tab) in the morning and 20 mg (1 tab) in the evening.  YOU WILL NEED TO COMPLETE A COVID TEST on Friday June 5TH at [facility] drive through testing site.   Your physician recommends that you schedule a follow-up appointment in: 1 MONTH with Dr [name].     [facility] [place] [id number] [place] [address] Dept: [phone] Loc: [phone]  STEFANOS HAYNESWORTH  12/03/2018  You are scheduled for a Cardiac Catheterization on Wednesday, June 10 with Dr. Loralie Champagne.  1. Please arrive at the [facility] (Main Entrance A) at [facility]: [address] [place], [place] [address] at 8:00 AM (This time is two hours before your procedure to ensure your preparation). Free valet parking service is available.   Special note: Every effort is made to have your procedure done on time. Please understand that emergencies sometimes delay scheduled procedures.  2. Diet: Do not eat solid foods after midnight.  The patient may have clear liquids until 5am upon the day of the procedure.   3. Medication instructions in preparation for your procedure: DO NOT TAKE LASIX, SPIROLACTONE  OR YOUR METFORMIN THE MORNING OF YOUR PROCEDURE.   HOLD YOUR ELIQUIS THE DAY BEFORE AND THE DAY OF YOUR PROCEDURE.  4. You may take all other morning medications with sips of water.   Contrast Allergy: No   5. Plan for one night stay--bring personal belongings. 6. Bring a current list of your medications and current  insurance cards. 7. You MUST have a responsible person to drive you home. 8. Someone MUST be with you the first 24 hours after you arrive home or your discharge will be delayed. 9. Please wear clothes that are easy to get on and off and wear slip-on shoes.  Thank you for allowing [place] to care for you!   -- [facility]

## 2018-12-03 NOTE — Telephone Encounter (Signed)
Pt aware of result of chest xray. Called Dr Chase Caller office to assist with arranging appt, per office staff Dr Chase Caller is  not accepting patients until July as MD is in hospital helping with Covid patients. Can patient wait until July?   Routed to Dr [name]

## 2018-12-04 ENCOUNTER — Ambulatory Visit: Payer: Medicare HMO | Admitting: Nurse Practitioner

## 2018-12-04 ENCOUNTER — Telehealth: Payer: Self-pay

## 2018-12-04 ENCOUNTER — Encounter: Payer: Self-pay | Admitting: Nurse Practitioner

## 2018-12-04 VITALS — BP 98/60 | HR 82 | Temp 97.7°F | Ht 70.0 in | Wt 208.0 lb

## 2018-12-04 DIAGNOSIS — J849 Interstitial pulmonary disease, unspecified: Secondary | ICD-10-CM | POA: Diagnosis not present

## 2018-12-04 DIAGNOSIS — I509 Heart failure, unspecified: Secondary | ICD-10-CM | POA: Diagnosis not present

## 2018-12-04 DIAGNOSIS — J45909 Unspecified asthma, uncomplicated: Secondary | ICD-10-CM | POA: Diagnosis not present

## 2018-12-04 DIAGNOSIS — I2510 Atherosclerotic heart disease of native coronary artery without angina pectoris: Secondary | ICD-10-CM | POA: Diagnosis not present

## 2018-12-04 MED ORDER — PREDNISONE 10 MG PO TABS: ORAL_TABLET | ORAL | 0 refills | Status: DC

## 2018-12-04 MED ORDER — MOMETASONE FURO-FORMOTEROL FUM 200-5 MCG/ACT IN AERO: 2.0000 | INHALATION_SPRAY | Freq: Two times a day (BID) | RESPIRATORY_TRACT | 0 refills | Status: DC

## 2018-12-04 MED ORDER — ALBUTEROL SULFATE 0.63 MG/3ML IN NEBU: 1.0000 | INHALATION_SOLUTION | Freq: Four times a day (QID) | RESPIRATORY_TRACT | 12 refills | Status: AC | PRN

## 2018-12-04 MED ORDER — ALBUTEROL SULFATE HFA 108 (90 BASE) MCG/ACT IN AERS: 1.0000 | INHALATION_SPRAY | Freq: Four times a day (QID) | RESPIRATORY_TRACT | 3 refills | Status: DC | PRN

## 2018-12-04 NOTE — Progress Notes (Signed)
_0  ID: Vincent Chandler, male    DOB: 1961/02/11, 58 y.o.   MRN: [id number]  Chief Complaint  Patient presents with   Other    Interstitial Lung Disease. Patient stated his breathing has gottien worse. Feels like it is asthma. Has been seen by Cardiology 12/03/18    Referring provider: No ref. provider found  HPI 58 year old male never smoker with ILD (history of amiodarone therapy), chronic cough is followed by Dr. Chase Chandler. PMH: Chronic systolic heart failure, CAD s/p CABG, CKD stage III, DM type II, OSA (previously on CPAP), asthma, a-fib/atrial flutter (on eliquis)  Tests: CXR 12/03/18 - Pulmonary fibrosis. Enlargement of cardiac silhouette post CABG and AICD. No acute abnormalities. 09/25/2017 => walk test - Walking desaturation test on 09/25/2017 185 feet x 3 laps on ROOM AIR:  did walk all 3 at normal pace. Did not have complaints. Didnot desaturate. Rest pulse ox was 98%, final pulse ox was 90%. HR response 72/min at rest to 88/min at peak exertion. Patient Vincent Chandler  Did no6 Desaturate < 88% . [name] did yes  Desaturated </= 3% points. [name] did not get tachyardic. These features are c/w MILD/EARLY ILD 09/17/17 - > HRCT  - read by Thoracic radiology as Probable UIP (> 50-80% this is UIP) ECHO TEE 08/20/17 - ef 15%  (CPST 08/24/17 - high risk CHF features with Vo2 max 12 and also desaturation  < 90%) Walking desaturation test on 10/30/2017 185 feet x 3 laps on ROOM AIR:  did not  Desaturate to < 88%. Rest pulse ox was 97%, final pulse ox was 90%. HR response 60/min at rest to 70/min at peak exertion. Patient Vincent Chandler  Did not Desaturate < 88% . [name] did yes  Desaturated </= 3% points. [name] did not get tachyardic. Moderate pace . Denied complaints Results for Vincent Chandler (MRN [id number]) as of 10/30/2017 17:01  Ref. Range 09/25/2017 17:03  ASPERGILLUS FUMIGATUS Latest Ref Range: NEGATIVE  NEGATIVE  [place] Serum Latest Ref Range: NEGATIVE  NEGATIVE    Anit Nuclear Antibody(ANA) Latest Ref Range: Negative  Negative  Cyclic Citrullin Peptide Ab Latest Units: UNITS <16  ds DNA Ab Latest Units: IU/mL 5 (H)  Myeloperoxidase Abs Latest Units: AI <1.0  Serine Protease 3 Latest Units: AI 2.9 (H)  RA Latex Turbid. Latest Ref Range: <14 IU/mL <14  SSA (Ro) (ENA) Antibody, IgG Latest Ref Range: <1.0 NEG AI <1.0 NEG  SSB (La) (ENA) Antibody, IgG Latest Ref Range: <1.0 NEG AI <1.0 NEG  Scleroderma (Scl-70) (ENA) Antibody, IgG Latest Ref Range: <1.0 NEG AI <1.0 NEG   Results for Vincent Chandler (MRN [id number]) as of 10/30/2017 17:01  Ref. Range 08/16/2017 14:34 10/30/2017 14:45  FVC-Pre Latest Units: L 2.89 2.81  FVC-%Pred-Pre Latest Units: % 61 59    Results for Vincent Chandler (MRN [id number]) as of 10/30/2017 17:01  Ref. Range 08/16/2017 14:34 10/30/2017 14:45  TLC Latest Units: L 4.09 3.85  TLC % pred Latest Units: % 60 56   Results for Vincent Chandler (MRN [id number]) as of 10/30/2017 17:01  Ref. Range 09/25/2017 17:03  Sed Rate Latest Ref Range: 0 - 20 mm/hr 18   RHC 10/03/2017 RA mean 5 RV 51/5 PA 54/18, mean 31 PCWP mean 12 Oxygen saturations: PA 66% AO 96% Cardiac Output (Fick) 5.22  Cardiac Index (Fick) 2.51 PVR 3.6 WU  11/28/17 - bronchoscopy and bronchoalveolar lavage. Results show that he has 70% macro phage [normal is greater than 90% macrophage)  and 8% eosinophils [normal is less than 5% eosinophils) - suspected  drug-induced pneumonitis - possibly amiodarone  OV 12/04/18 -follow-up Patient presents today for follow-up on ILD.  He was last seen by Dr. Chase Chandler on 12/27/2017.  Patient was treated for suspected drug-induced pneumonitis from amiodarone.  Was started on prednisone taper and was advised to continue 10 mg daily.  Patient was advised to follow-up and 1 month but failed to do so.  He states that he is compliant and is still taking prednisone daily.  States that he was previously on [facility] and albuterol and that these  medications helped but he has not had them refilled in the past several months.  He states that over the past few months since the beginning of the year he has progressively worsened.  He has increased shortness of breath with exertion.  He still is able to perform activities of daily living such as shopping and family outings.  He notices extreme shortness of breath when he walks up the hill from his mailbox.  Patient is followed by cardiology and is scheduled for a heart cath next week.  When patient arrived to the office today his sats were noted to be at 70% on room air.  Once in the exam room and sitting in the chair his sats returned to 92% on room air.  Patient denies any significant wheezing or cough.  Patient had chest x-ray and labs completed yesterday by cardiology.  Denies f/c/s, n/v/d, hemoptysis, PND, leg swelling.  OSA: Patient has history of OSA requiring CPAP.  He states that his machine was damaged more than 3 years ago and has not used it since that time.  Has not followed up with his previous sleep doctor.  He was previously diagnosed with severe sleep apnea per patient.   No Known Allergies  Immunization History  Administered Date(s) Administered   Influenza Inj Mdck Quad Pf 06/14/2018   Influenza Split 06/05/2012   Influenza Whole 05/09/2010   Influenza,inj,Quad PF,6+ Mos 04/09/2013, 06/05/2014, 08/17/2017   Influenza-Unspecified 04/03/2015   Pneumococcal Conjugate-13 06/05/2014   Pneumococcal Polysaccharide-23 02/17/2012   Td 05/09/2010    Past Medical History:  Diagnosis Date   AICD (automatic cardioverter/defibrillator) present    Anginal pain (HCC)    Anxiety    Asthma    Atrial fibrillation-postoperative 11/28/2012   Cardiomyopathy    alcohol use related   CHF (congestive heart failure) (HCC)    Chronic back pain    Coronary artery disease    drug eluting stent RCA 2005-EF 30%- s/p CABG x 4; 2/4 patent grafts with SVG-PLOM and SVG-RCA system  totally occluded. There are collaterals from the LAD system to the RCA and the RCA is totally occluded proximally. There are no collaterals to the LCx territory. He then underwent successful PCI of the mid left circumflex artery with overlapping Synergy drug-eluting stents   Depression    pt denies   Diabetes mellitus type II dx'd in the 1990's   GERD (gastroesophageal reflux disease)    yrs ago   H/O hiatal hernia    Hemorrhoids    History of hypogonadism    History of kidney stones    Hyperlipidemia    Hypertension    OSA on CPAP    "mask is broken; working on getting a new one" (01/15/2015; 10/03/2017)   Pneumonia    "3-4 times" (10/03/2017)   Urinary incontinence     Tobacco History: Social History   Tobacco Use  Smoking Status  Never Smoker  Smokeless Tobacco Current User   Types: Chew   Ready to quit: Not Answered Counseling given: Not Answered   Outpatient Encounter Medications as of 12/04/2018  Medication Sig   ACCU-CHEK FASTCLIX LANCETS MISC Use as directed once a day.  Dx code: E11.9   acetaminophen (TYLENOL) 500 MG tablet Take 1,000 mg by mouth daily as needed for moderate pain or headache.   albuterol (PROAIR HFA) 108 (90 Base) MCG/ACT inhaler Inhale 1-2 puffs into the lungs every 6 (six) hours as needed for wheezing or shortness of breath.   atorvastatin (LIPITOR) 40 MG tablet Take 1 tablet (40 mg total) by mouth at bedtime.   blood glucose meter kit and supplies Dispense based on patient and insurance preference. Use as directed once a day. Dx Code E11.9.   Blood Glucose Monitoring Suppl (ACCU-CHEK GUIDE) w/Device KIT 1 each by Does not apply route daily. DX Code: E11.9   carvedilol (COREG) 12.5 MG tablet Take 2 tablets (25 mg total) by mouth 2 (two) times daily with a meal.   digoxin (LANOXIN) 0.125 MG tablet TAKE 1/2 TABLET BY MOUTH DAILY.   ELIQUIS 5 MG TABS tablet TAKE 1 TABLET BY MOUTH 2 TIMES DAILY.   furosemide (LASIX) 20 MG tablet Take  2 tablets (40 mg total) by mouth every morning AND 1 tablet (20 mg total) at bedtime.   glucose blood (ACCU-CHEK GUIDE) test strip Use as directed once a day.  Dx code: E11.9   guaiFENesin (MUCINEX) 600 MG 12 hr tablet Take 1 tablet (600 mg total) by mouth 2 (two) times daily as needed for to loosen phlegm.   HYDROcodone-homatropine (HYCODAN) 5-1.5 MG/5ML syrup Take 5 mLs by mouth every 6 (six) hours as needed for cough.   Icosapent Ethyl 1 g CAPS Take 2 capsules (2 g total) by mouth 2 (two) times a day.   metFORMIN (GLUCOPHAGE) 500 MG tablet TAKE 1 TABLET BY MOUTH 2 TIMES DAILY WITH A MEAL.   mometasone-formoterol (DULERA) 200-5 MCG/ACT AERO Inhale 2 puffs into the lungs 2 (two) times daily. Further refills per PCP   nitroGLYCERIN (NITROSTAT) 0.4 MG SL tablet Place 1 tablet (0.4 mg total) under the tongue every 5 (five) minutes as needed for chest pain.   oxyCODONE (OXY IR/ROXICODONE) 5 MG immediate release tablet Take 1-2 tablets (5-10 mg total) by mouth every 4 (four) hours as needed for moderate pain.   predniSONE (DELTASONE) 10 MG tablet Take 1 tablet (10 mg total) by mouth daily.   sacubitril-valsartan (ENTRESTO) 49-51 MG Take 1 tablet by mouth 2 (two) times daily.   spironolactone (ALDACTONE) 25 MG tablet Take 1 tablet (25 mg total) by mouth daily.   [DISCONTINUED] albuterol (PROAIR HFA) 108 (90 Base) MCG/ACT inhaler Inhale 1-2 puffs into the lungs every 6 (six) hours as needed for wheezing or shortness of breath.   albuterol (ACCUNEB) 0.63 MG/3ML nebulizer solution Take 3 mLs (0.63 mg total) by nebulization every 6 (six) hours as needed for wheezing.   mometasone-formoterol (DULERA) 200-5 MCG/ACT AERO Inhale 2 puffs into the lungs 2 (two) times a day.   predniSONE (DELTASONE) 10 MG tablet Take 4 tabs for 2 days, then 3 tabs for 2 days, then 2 tabs for 2 days, then 1 tab for 2 days, then stop   No facility-administered encounter medications on file as of 12/04/2018.       Review of Systems  Review of Systems  Constitutional: Negative.  Negative for chills and fever.  HENT: Negative.  Respiratory: Positive for shortness of breath. Negative for cough and wheezing.   Cardiovascular: Negative.  Negative for chest pain, palpitations and leg swelling.  Gastrointestinal: Negative.   Allergic/Immunologic: Negative.   Neurological: Negative.   Psychiatric/Behavioral: Negative.        Physical Exam  BP 98/60    Pulse 82    Temp 97.7 F (36.5 C)    Ht _0  (1.778 m)    Wt 208 lb (94.3 kg)    SpO2 92%    BMI 29.84 kg/m   Wt Readings from Last 5 Encounters:  12/04/18 208 lb (94.3 kg)  12/03/18 207 lb 3.2 oz (94 kg)  11/14/18 209 lb 12.8 oz (95.2 kg)  09/11/18 211 lb (95.7 kg)  08/28/18 210 lb (95.3 kg)     Physical Exam Vitals signs and nursing note reviewed.  Constitutional:      General: He is not in acute distress.    Appearance: He is well-developed.  Cardiovascular:     Rate and Rhythm: Normal rate and regular rhythm.  Pulmonary:     Effort: Pulmonary effort is normal. No respiratory distress.     Breath sounds: Normal breath sounds. No wheezing or rhonchi.     Comments: Crackles noted to bilateral bases.  Skin:    General: Skin is warm and dry.  Neurological:     Mental Status: He is alert and oriented to person, place, and time.      Lab Results:  CBC    Component Value Date/Time   WBC 7.0 11/14/2018 1408   RBC 3.74 (L) 11/14/2018 1408   HGB 13.8 11/14/2018 1408   HGB 14.3 08/21/2018 1500   HCT 42.9 11/14/2018 1408   HCT 41.0 08/21/2018 1500   PLT 180 11/14/2018 1408   PLT 204 08/21/2018 1500   MCV 114.7 (H) 11/14/2018 1408   MCV 110 (H) 08/21/2018 1500   MCH 36.9 (H) 11/14/2018 1408   MCHC 32.2 11/14/2018 1408   RDW 14.6 11/14/2018 1408   RDW 13.8 08/21/2018 1500   LYMPHSABS 1.4 07/10/2018 1700   MONOABS 0.4 06/04/2018 1602   EOSABS 0.2 07/10/2018 1700   BASOSABS 0.0 07/10/2018 1700    BMET    Component  Value Date/Time   NA 140 12/03/2018 1137   NA 138 07/10/2018 1700   K 4.8 12/03/2018 1137   CL 106 12/03/2018 1137   CO2 23 12/03/2018 1137   GLUCOSE 239 (H) 12/03/2018 1137   BUN 32 (H) 12/03/2018 1137   BUN 30 (H) 07/10/2018 1700   CREATININE 1.79 (H) 12/03/2018 1137   CREATININE 1.63 (H) 03/22/2016 1454   CALCIUM 9.2 12/03/2018 1137   GFRNONAA 41 (L) 12/03/2018 1137   GFRAA 48 (L) 12/03/2018 1137    BNP    Component Value Date/Time   BNP 288.2 (H) 12/03/2018 1137   BNP 78.8 07/14/2015 1547    ProBNP    Component Value Date/Time   PROBNP 731 (H) 07/12/2016 1603   PROBNP 165.0 (H) 06/16/2013 1111    Imaging: Dg Chest 2 View  Result Date: 12/03/2018 CLINICAL DATA:  Chronic systolic CHF, dyspnea, history coronary artery disease, type II diabetes mellitus, hypertension EXAM: CHEST - 2 VIEW COMPARISON:  06/04/2018 FINDINGS: LEFT subclavian AICD lead with tip projecting at RIGHT ventricle. Enlargement of cardiac silhouette post CABG. Mediastinal contours and pulmonary vascularity normal. Stimulator extending into RIGHT cervical region new since previous exam with generator projecting over upper lateral RIGHT chest. Chronic interstitial infiltrates in the mid to  lower lungs consistent with pulmonary fibrosis, stable. No definite acute infiltrate, pleural effusion or pneumothorax. Bones unremarkable. IMPRESSION: Pulmonary fibrosis. Enlargement of cardiac silhouette post CABG and AICD. No acute abnormalities. Electronically Signed   By: Lavonia Dana M.D.   On: 12/03/2018 14:09     Assessment & Plan:   ILD (interstitial lung disease) ([address]) ILD: Patient presents today for follow-up on ILD.  He was last seen by Dr. Chase Chandler on 12/27/2017.  Patient was treated for suspected drug-induced pneumonitis from amiodarone.  Was started on prednisone taper and was advised to continue 10 mg daily.  Patient was advised to follow-up and 1 month but failed to do so.  He states that he is compliant and  is still taking prednisone daily.  States that he was previously on [facility] and albuterol and that these medications helped but he has not had them refilled in the past several months.  He states that over the past few months since the beginning of the year he has progressively worsened.  He has increased shortness of breath with exertion.  He still is able to perform activities of daily living such as shopping and family outings.  He notices extreme shortness of breath when he walks up the hill from his mailbox.  Patient is followed by cardiology and is scheduled for a heart cath next week.  When patient arrived to the office today his sats were noted to be at 70% on room air.  Once in the exam room and sitting in the chair his sats returned to 92% on room air.  Plan: ILD: Will order HRCT Heart cath scheduled for next week Will order prednisone taper - return to 10 mg daily after taper Restart Dulera Restart albuterol as needed Will order albuterol neb treatment for home Will order ONO  Patient walked in office today - sats dropped to 79% on RA and returned to 93% when placed at 3 L [place] Will order home O2 to use with exertion at 2- 3 L to keep sats above 90%  OSA: Patient has history of OSA requiring CPAP.  He states that his machine was damaged more than 3 years ago and has not used it since that time.  Has not followed up with his previous sleep doctor.  He was previously diagnosed with severe sleep apnea per patient.  PLAN: OSA: Patient previously on CPAP - has not used CPAP in 3 years Will order in lab sleep study Will refer to Dr. Elsworth Soho or Dr. Halford Chessman (first available) for OSA  Patient Instructions  ILD: Will order HRCT Heart cath scheduled for next week Will order prednisone taper - return to 10 mg daily after taper Restart Dulera Restart albuterol as needed Will order albuterol neb treatment for home Will order ONO  Patient walked in office today - sats dropped to 79% on RA and  returned to 93% when placed at 3 L [place] Will order home O2 to use with exertion at 2- 3 L to keep sats above 90%  OSA: Patient previously on CPAP - has not used CPAP in 3 years Will order in lab sleep study Will refer to Dr. Elsworth Soho or Dr. Halford Chessman (first available) for OSA    Follow up in 1 month with Dr. Chase Chandler or APP - sooner if needed Patient will need PFT and 6 minute walk scheduled         Fenton Foy, NP 12/05/2018

## 2018-12-04 NOTE — Telephone Encounter (Signed)
[facility] but no one answered.   Will attempt to call back later.

## 2018-12-04 NOTE — Patient Instructions (Addendum)
ILD: Will order HRCT Heart cath scheduled for next week Will order prednisone taper - return to 10 mg daily after taper Restart Dulera Restart albuterol as needed Will order albuterol neb treatment for home Will order ONO  Patient walked in office today - sats dropped to 79% on RA and returned to 93% when placed at 3 L [place] Will order home O2 to use with exertion at 2- 3 L to keep sats above 90%  OSA: Patient previously on CPAP - has not used CPAP in 3 years Will order in lab sleep study Will refer to Dr. Elsworth Soho or Dr. Halford Chessman (first available) for OSA    Follow up in 1 month with [name] or APP - sooner if needed Patient will need PFT and 6 minute walk scheduled

## 2018-12-05 ENCOUNTER — Encounter: Payer: Self-pay | Admitting: Nurse Practitioner

## 2018-12-05 ENCOUNTER — Telehealth (HOSPITAL_COMMUNITY): Payer: Self-pay | Admitting: *Deleted

## 2018-12-05 ENCOUNTER — Other Ambulatory Visit (HOSPITAL_COMMUNITY): Payer: Self-pay | Admitting: Student

## 2018-12-05 ENCOUNTER — Other Ambulatory Visit: Payer: Self-pay | Admitting: General Surgery

## 2018-12-05 ENCOUNTER — Other Ambulatory Visit: Payer: Self-pay | Admitting: Family Medicine

## 2018-12-05 ENCOUNTER — Telehealth: Payer: Self-pay | Admitting: Nurse Practitioner

## 2018-12-05 DIAGNOSIS — E1151 Type 2 diabetes mellitus with diabetic peripheral angiopathy without gangrene: Secondary | ICD-10-CM

## 2018-12-05 DIAGNOSIS — G4733 Obstructive sleep apnea (adult) (pediatric): Secondary | ICD-10-CM

## 2018-12-05 MED FILL — ELIQUIS 5 MG TABLET: 5 | 30 days supply | Qty: 60 | Fill #0

## 2018-12-05 MED FILL — ATORVASTATIN 40 MG TABLET: 40 | 90 days supply | Qty: 90 | Fill #0

## 2018-12-05 MED FILL — predniSONE 10 MG TABS: 10 | 8 days supply | Qty: 20 | Fill #0

## 2018-12-05 MED FILL — VENTOLIN HFA 90 MCG INHALER: 108 (90 BAS | 25 days supply | Qty: 18 | Fill #0

## 2018-12-05 NOTE — Telephone Encounter (Signed)
Vincent Chandler from [facility] is returning phone call.  Phone number is [phone].

## 2018-12-05 NOTE — Telephone Encounter (Signed)
Returned call to [employer]. ProAir changed to generic Ventolin per insurance. Nothing further needed.

## 2018-12-05 NOTE — Assessment & Plan Note (Signed)
ILD: Patient presents today for follow-up on ILD.  He was last seen by [name] on 12/27/2017.  Patient was treated for suspected drug-induced pneumonitis from amiodarone.  Was started on prednisone taper and was advised to continue 10 mg daily.  Patient was advised to follow-up and 1 month but failed to do so.  He states that he is compliant and is still taking prednisone daily.  States that he was previously on [facility] and albuterol and that these medications helped but he has not had them refilled in the past several months.  He states that over the past few months since the beginning of the year he has progressively worsened.  He has increased shortness of breath with exertion.  He still is able to perform activities of daily living such as shopping and family outings.  He notices extreme shortness of breath when he walks up the hill from his mailbox.  Patient is followed by cardiology and is scheduled for a heart cath next week.  When patient arrived to the office today his sats were noted to be at 70% on room air.  Once in the exam room and sitting in the chair his sats returned to 92% on room air.  Plan: ILD: Will order HRCT Heart cath scheduled for next week Will order prednisone taper - return to 10 mg daily after taper Restart Dulera Restart albuterol as needed Will order albuterol neb treatment for home Will order ONO  Patient walked in office today - sats dropped to 79% on RA and returned to 93% when placed at 3 L [place] Will order home O2 to use with exertion at 2- 3 L to keep sats above 90%  OSA: Patient has history of OSA requiring CPAP.  He states that his machine was damaged more than 3 years ago and has not used it since that time.  Has not followed up with his previous sleep doctor.  He was previously diagnosed with severe sleep apnea per patient.  PLAN: OSA: Patient previously on CPAP - has not used CPAP in 3 years Will order in lab sleep study Will refer to Dr. Elsworth Soho or Dr.  Halford Chessman (first available) for OSA  Patient Instructions  ILD: Will order HRCT Heart cath scheduled for next week Will order prednisone taper - return to 10 mg daily after taper Restart Dulera Restart albuterol as needed Will order albuterol neb treatment for home Will order ONO  Patient walked in office today - sats dropped to 79% on RA and returned to 93% when placed at 3 L [place] Will order home O2 to use with exertion at 2- 3 L to keep sats above 90%  OSA: Patient previously on CPAP - has not used CPAP in 3 years Will order in lab sleep study Will refer to Dr. Elsworth Soho or Dr. Halford Chessman (first available) for OSA    Follow up in 1 month with [name] or APP - sooner if needed Patient will need PFT and 6 minute walk scheduled

## 2018-12-05 NOTE — Telephone Encounter (Signed)
[facility] pre cert in clinical review.

## 2018-12-06 ENCOUNTER — Other Ambulatory Visit: Payer: Self-pay

## 2018-12-06 ENCOUNTER — Other Ambulatory Visit (HOSPITAL_COMMUNITY)
Admission: RE | Admit: 2018-12-06 | Discharge: 2018-12-06 | Disposition: A | Discharge status: Home / Self Care | Payer: Medicare HMO | Source: Ambulatory Visit | Attending: Cardiology | Admitting: Cardiology

## 2018-12-06 DIAGNOSIS — Z1159 Encounter for screening for other viral diseases: Secondary | ICD-10-CM | POA: Diagnosis not present

## 2018-12-06 DIAGNOSIS — I5022 Chronic systolic (congestive) heart failure: Secondary | ICD-10-CM

## 2018-12-06 DIAGNOSIS — Z01812 Encounter for preprocedural laboratory examination: Secondary | ICD-10-CM | POA: Diagnosis not present

## 2018-12-07 LAB — NOVEL CORONAVIRUS, NAA (HOSP ORDER, SEND-OUT TO REF LAB; TAT 18-24 HRS): SARS-CoV-2, NAA: NOT DETECTED

## 2018-12-09 MED FILL — metFORMIN HCL 500 MG TABS: 500 | 30 days supply | Qty: 60 | Fill #0

## 2018-12-10 NOTE — Telephone Encounter (Signed)
Called and scheduled patient for PFT and f/u with TN on 01/16/2019-pr

## 2018-12-11 ENCOUNTER — Other Ambulatory Visit: Payer: Self-pay

## 2018-12-11 ENCOUNTER — Encounter (HOSPITAL_COMMUNITY): Payer: Self-pay | Admitting: Cardiology

## 2018-12-11 ENCOUNTER — Ambulatory Visit (HOSPITAL_COMMUNITY)
Admission: RE | Admit: 2018-12-11 | Discharge: 2018-12-11 | Disposition: A | Discharge status: Home / Self Care | Payer: Medicare HMO | Attending: Cardiology | Admitting: Cardiology

## 2018-12-11 ENCOUNTER — Encounter (HOSPITAL_COMMUNITY)
Admission: RE | Disposition: A | Discharge status: Home / Self Care | Payer: Self-pay | Source: Home / Self Care | Attending: Cardiology

## 2018-12-11 DIAGNOSIS — Z951 Presence of aortocoronary bypass graft: Secondary | ICD-10-CM | POA: Diagnosis not present

## 2018-12-11 DIAGNOSIS — Z7951 Long term (current) use of inhaled steroids: Secondary | ICD-10-CM | POA: Diagnosis not present

## 2018-12-11 DIAGNOSIS — I2723 Pulmonary hypertension due to lung diseases and hypoxia: Secondary | ICD-10-CM | POA: Diagnosis not present

## 2018-12-11 DIAGNOSIS — I4891 Unspecified atrial fibrillation: Secondary | ICD-10-CM | POA: Diagnosis not present

## 2018-12-11 DIAGNOSIS — Z79899 Other long term (current) drug therapy: Secondary | ICD-10-CM | POA: Diagnosis not present

## 2018-12-11 DIAGNOSIS — Z7984 Long term (current) use of oral hypoglycemic drugs: Secondary | ICD-10-CM | POA: Insufficient documentation

## 2018-12-11 DIAGNOSIS — N183 Chronic kidney disease, stage 3 (moderate): Secondary | ICD-10-CM | POA: Diagnosis not present

## 2018-12-11 DIAGNOSIS — Z7901 Long term (current) use of anticoagulants: Secondary | ICD-10-CM | POA: Diagnosis not present

## 2018-12-11 DIAGNOSIS — Z833 Family history of diabetes mellitus: Secondary | ICD-10-CM | POA: Insufficient documentation

## 2018-12-11 DIAGNOSIS — R0600 Dyspnea, unspecified: Secondary | ICD-10-CM | POA: Insufficient documentation

## 2018-12-11 DIAGNOSIS — I509 Heart failure, unspecified: Secondary | ICD-10-CM | POA: Diagnosis not present

## 2018-12-11 DIAGNOSIS — E785 Hyperlipidemia, unspecified: Secondary | ICD-10-CM | POA: Diagnosis not present

## 2018-12-11 DIAGNOSIS — Z8249 Family history of ischemic heart disease and other diseases of the circulatory system: Secondary | ICD-10-CM | POA: Diagnosis not present

## 2018-12-11 DIAGNOSIS — I5023 Acute on chronic systolic (congestive) heart failure: Secondary | ICD-10-CM | POA: Insufficient documentation

## 2018-12-11 DIAGNOSIS — I5022 Chronic systolic (congestive) heart failure: Secondary | ICD-10-CM

## 2018-12-11 DIAGNOSIS — I255 Ischemic cardiomyopathy: Secondary | ICD-10-CM | POA: Diagnosis not present

## 2018-12-11 DIAGNOSIS — J8410 Pulmonary fibrosis, unspecified: Secondary | ICD-10-CM | POA: Diagnosis not present

## 2018-12-11 DIAGNOSIS — G4733 Obstructive sleep apnea (adult) (pediatric): Secondary | ICD-10-CM | POA: Diagnosis not present

## 2018-12-11 DIAGNOSIS — I4892 Unspecified atrial flutter: Secondary | ICD-10-CM | POA: Diagnosis not present

## 2018-12-11 DIAGNOSIS — E1122 Type 2 diabetes mellitus with diabetic chronic kidney disease: Secondary | ICD-10-CM | POA: Diagnosis not present

## 2018-12-11 DIAGNOSIS — I252 Old myocardial infarction: Secondary | ICD-10-CM | POA: Diagnosis not present

## 2018-12-11 DIAGNOSIS — I130 Hypertensive heart and chronic kidney disease with heart failure and stage 1 through stage 4 chronic kidney disease, or unspecified chronic kidney disease: Secondary | ICD-10-CM | POA: Diagnosis not present

## 2018-12-11 HISTORY — PX: RIGHT HEART CATH: CATH118263

## 2018-12-11 LAB — POCT I-STAT EG7
Acid-Base Excess: 2 mmol/L (ref 0.0–2.0)
Acid-Base Excess: 2 mmol/L (ref 0.0–2.0)
Bicarbonate: 29.3 mmol/L — ABNORMAL HIGH (ref 20.0–28.0)
Bicarbonate: 29.7 mmol/L — ABNORMAL HIGH (ref 20.0–28.0)
Calcium, Ion: 1.27 mmol/L (ref 1.15–1.40)
Calcium, Ion: 1.29 mmol/L (ref 1.15–1.40)
HCT: 44 % (ref 39.0–52.0)
HCT: 45 % (ref 39.0–52.0)
Hemoglobin: 15 g/dL (ref 13.0–17.0)
Hemoglobin: 15.3 g/dL (ref 13.0–17.0)
O2 Saturation: 69 %
O2 Saturation: 69 %
Potassium: 3.9 mmol/L (ref 3.5–5.1)
Potassium: 4.1 mmol/L (ref 3.5–5.1)
Sodium: 139 mmol/L (ref 135–145)
Sodium: 139 mmol/L (ref 135–145)
TCO2: 31 mmol/L (ref 22–32)
TCO2: 31 mmol/L (ref 22–32)
pCO2, Ven: 55.1 mmHg (ref 44.0–60.0)
pCO2, Ven: 56.4 mmHg (ref 44.0–60.0)
pH, Ven: 7.33 (ref 7.250–7.430)
pH, Ven: 7.334 (ref 7.250–7.430)
pO2, Ven: 40 mmHg (ref 32.0–45.0)
pO2, Ven: 40 mmHg (ref 32.0–45.0)

## 2018-12-11 LAB — POCT I-STAT CREATININE: Creatinine, Ser: 2 mg/dL — ABNORMAL HIGH (ref 0.61–1.24)

## 2018-12-11 LAB — GLUCOSE, CAPILLARY: Glucose-Capillary: 285 mg/dL — ABNORMAL HIGH (ref 70–99)

## 2018-12-11 SURGERY — RIGHT HEART CATH
Anesthesia: LOCAL

## 2018-12-11 MED ORDER — LABETALOL HCL 5 MG/ML IV SOLN: 10.0000 mg | INTRAVENOUS | Status: DC | PRN

## 2018-12-11 MED ORDER — ONDANSETRON HCL 4 MG/2ML IJ SOLN: 4.0000 mg | Freq: Four times a day (QID) | INTRAMUSCULAR | Status: DC | PRN

## 2018-12-11 MED ORDER — HYDRALAZINE HCL 20 MG/ML IJ SOLN: 10.0000 mg | INTRAMUSCULAR | Status: DC | PRN

## 2018-12-11 MED ORDER — SODIUM CHLORIDE 0.9 % IV SOLN
INTRAVENOUS | Status: DC
  Administered 2018-12-11: 09:00:00 via INTRAVENOUS

## 2018-12-11 MED ORDER — HEPARIN (PORCINE) IN NACL 1000-0.9 UT/500ML-% IV SOLN
INTRAVENOUS | Status: AC
  Filled 2018-12-11: qty 500

## 2018-12-11 MED ORDER — SODIUM CHLORIDE 0.9% FLUSH: 3.0000 mL | Freq: Two times a day (BID) | INTRAVENOUS | Status: DC

## 2018-12-11 MED ORDER — LIDOCAINE HCL (PF) 1 % IJ SOLN
INTRAMUSCULAR | Status: DC | PRN
  Administered 2018-12-11: 2 mL via INTRADERMAL

## 2018-12-11 MED ORDER — LIDOCAINE HCL (PF) 1 % IJ SOLN
INTRAMUSCULAR | Status: AC
  Filled 2018-12-11: qty 30

## 2018-12-11 MED ORDER — SODIUM CHLORIDE 0.9% FLUSH: 3.0000 mL | INTRAVENOUS | Status: DC | PRN

## 2018-12-11 MED ORDER — ASPIRIN 81 MG PO CHEW: 81.0000 mg | CHEWABLE_TABLET | ORAL | Status: DC

## 2018-12-11 MED ORDER — SODIUM CHLORIDE 0.9 % IV SOLN: 250.0000 mL | INTRAVENOUS | Status: DC | PRN

## 2018-12-11 MED ORDER — FUROSEMIDE 20 MG PO TABS: 40.0000 mg | ORAL_TABLET | Freq: Every day | ORAL | 11 refills | Status: DC

## 2018-12-11 MED ORDER — ACETAMINOPHEN 325 MG PO TABS: 650.0000 mg | ORAL_TABLET | ORAL | Status: DC | PRN

## 2018-12-11 MED ORDER — HEPARIN (PORCINE) IN NACL 1000-0.9 UT/500ML-% IV SOLN
INTRAVENOUS | Status: DC | PRN
  Administered 2018-12-11: 500 mL

## 2018-12-11 SURGICAL SUPPLY — 8 items
CATH BALLN WEDGE 5F 110CM (CATHETERS) ×1 IMPLANT
CATH SWAN GANZ 7F STRAIGHT (CATHETERS) ×1 IMPLANT
GLIDESHEATH SLENDER 7FR .021G (SHEATH) ×1 IMPLANT
PACK CARDIAC CATHETERIZATION (CUSTOM PROCEDURE TRAY) ×2 IMPLANT
PROTECTION STATION PRESSURIZED (MISCELLANEOUS) ×2
STATION PROTECTION PRESSURIZED (MISCELLANEOUS) IMPLANT
TRANSDUCER W/STOPCOCK (MISCELLANEOUS) ×2 IMPLANT
TUBING CIL FLEX 10 FLL-RA (TUBING) ×2 IMPLANT

## 2018-12-11 NOTE — Discharge Instructions (Signed)
Brachial Site Care  This sheet gives you information about how to care for yourself after your procedure. Your health care provider may also give you more specific instructions. If you have problems or questions, contact your health care provider. What can I expect after the procedure? After the procedure, it is common to have:  Bruising and tenderness at the catheter insertion area. Follow these instructions at home: Medicines  Take over-the-counter and prescription medicines only as told by your health care provider. Insertion site care  Follow instructions from your health care provider about how to take care of your insertion site. Make sure you: ? Wash your hands with soap and water before you change your bandage (dressing). If soap and water are not available, use hand sanitizer. ? Change your dressing as told by your health care provider. ? Leave stitches (sutures), skin glue, or adhesive strips in place. These skin closures may need to stay in place for 2 weeks or longer. If adhesive strip edges start to loosen and curl up, you may trim the loose edges. Do not remove adhesive strips completely unless your health care provider tells you to do that.  Check your insertion site every day for signs of infection. Check for: ? Redness, swelling, or pain. ? Fluid or blood. ? Pus or a bad smell. ? Warmth.  Do not take baths, swim, or use a hot tub until your health care provider approves.  You may shower 24-48 hours after the procedure, or as directed by your health care provider. ? Remove the dressing and gently wash the site with plain soap and water. ? Pat the area dry with a clean towel. ? Do not rub the site. That could cause bleeding.  Do not apply powder or lotion to the site. Activity   For 24 hours after the procedure, or as directed by your health care provider: ? Do not flex or bend the affected arm. ? Do not push or pull heavy objects with the affected arm. ? Do not  drive yourself home from the hospital or clinic. You may drive 24 hours after the procedure unless your health care provider tells you not to. ? Do not operate machinery or power tools.  Do not lift anything that is heavier than 10 lb (4.5 kg), or the limit that you are told, until your health care provider says that it is safe.  Ask your health care provider when it is okay to: ? Return to work or school. ? Resume usual physical activities or sports. ? Resume sexual activity. General instructions  If the catheter site starts to bleed, raise your arm and put firm pressure on the site. If the bleeding does not stop, get help right away. This is a medical emergency.  If you went home on the same day as your procedure, a responsible adult should be with you for the first 24 hours after you arrive home.  Keep all follow-up visits as told by your health care provider. This is important. Contact a health care provider if:  You have a fever.  You have redness, swelling, or yellow drainage around your insertion site. Get help right away if:  You have unusual pain at the radial site.  The catheter insertion area swells very fast.  The insertion area is bleeding, and the bleeding does not stop when you hold steady pressure on the area.  Your arm or hand becomes pale, cool, tingly, or numb. These symptoms may represent a serious problem  that is an emergency. Do not wait to see if the symptoms will go away. Get medical help right away. Call your local emergency services (911 in the [place] Do not drive yourself to the hospital. Summary  After the procedure, it is common to have bruising and tenderness at the site.  Follow instructions from your health care provider about how to take care of your radial site wound. Check the wound every day for signs of infection.  Do not lift anything that is heavier than 10 lb (4.5 kg), or the limit that you are told, until your health care provider says  that it is safe. This information is not intended to replace advice given to you by your health care provider. Make sure you discuss any questions you have with your health care provider. Document Released: 07/22/2010 Document Revised: 07/25/2017 Document Reviewed: 07/25/2017 Elsevier Interactive Patient Education  2019 [employer].

## 2018-12-11 NOTE — Interval H&P Note (Signed)
History and Physical Interval Note:  12/11/2018 10:39 AM  Vincent Chandler  has presented today for surgery, with the diagnosis of heart failure.  The various methods of treatment have been discussed with the patient and family. After consideration of risks, benefits and other options for treatment, the patient has consented to  Procedure(s): RIGHT HEART CATH (N/A) as a surgical intervention.  The patient's history has been reviewed, patient examined, no change in status, stable for surgery.  I have reviewed the patient's chart and labs.  Questions were answered to the patient's satisfaction.     Eleri Ruben [employer]

## 2018-12-12 ENCOUNTER — Telehealth (HOSPITAL_COMMUNITY): Payer: Self-pay

## 2018-12-12 ENCOUNTER — Other Ambulatory Visit: Payer: Self-pay | Admitting: General Surgery

## 2018-12-12 DIAGNOSIS — G4733 Obstructive sleep apnea (adult) (pediatric): Secondary | ICD-10-CM

## 2018-12-12 DIAGNOSIS — I5022 Chronic systolic (congestive) heart failure: Secondary | ICD-10-CM

## 2018-12-12 NOTE — Telephone Encounter (Signed)
No answer/VM not set up

## 2018-12-13 ENCOUNTER — Other Ambulatory Visit (HOSPITAL_COMMUNITY): Payer: Self-pay

## 2018-12-13 ENCOUNTER — Encounter (HOSPITAL_COMMUNITY): Payer: Self-pay

## 2018-12-13 DIAGNOSIS — I5022 Chronic systolic (congestive) heart failure: Secondary | ICD-10-CM

## 2018-12-16 ENCOUNTER — Encounter (HOSPITAL_COMMUNITY): Payer: Self-pay

## 2018-12-16 NOTE — Telephone Encounter (Signed)
Attempted to reach patient again regarding lab results, no answer. Unable to leave message as vm not set up. Letter mailed

## 2018-12-16 NOTE — Progress Notes (Signed)
error 

## 2018-12-16 NOTE — Telephone Encounter (Signed)
-----  Message from Larey Dresser, MD sent at 12/11/2018  9:27 PM EDT ----- Decrease Lasix to 40 mg once daily. BMET in 1 week.

## 2018-12-17 MED FILL — ENTRESTO 49 MG-51 MG TABLET: 49-51 | 30 days supply | Qty: 60 | Fill #0

## 2018-12-18 ENCOUNTER — Other Ambulatory Visit (HOSPITAL_COMMUNITY): Payer: Medicare HMO

## 2018-12-18 NOTE — Telephone Encounter (Signed)
Pt left a message on VM returning our call. Spoke with patient advised to decrease lasix to 64m daily. Pt states he has already done so.  Pt will repeat labs on Friday.

## 2018-12-18 NOTE — Addendum Note (Signed)
Addended by: Valeda Malm on: 12/18/2018 01:51 PM   Modules accepted: Orders

## 2018-12-20 ENCOUNTER — Ambulatory Visit: Payer: Medicare HMO

## 2018-12-20 ENCOUNTER — Other Ambulatory Visit: Payer: Self-pay

## 2018-12-20 ENCOUNTER — Encounter (HOSPITAL_COMMUNITY): Payer: Medicare HMO

## 2018-12-20 DIAGNOSIS — G4733 Obstructive sleep apnea (adult) (pediatric): Secondary | ICD-10-CM

## 2018-12-22 DIAGNOSIS — G4733 Obstructive sleep apnea (adult) (pediatric): Secondary | ICD-10-CM

## 2018-12-25 ENCOUNTER — Telehealth: Payer: Self-pay | Admitting: *Deleted

## 2018-12-25 DIAGNOSIS — G4733 Obstructive sleep apnea (adult) (pediatric): Secondary | ICD-10-CM

## 2018-12-25 NOTE — Telephone Encounter (Signed)

## 2018-12-27 ENCOUNTER — Other Ambulatory Visit: Payer: Self-pay

## 2018-12-27 ENCOUNTER — Ambulatory Visit (INDEPENDENT_AMBULATORY_CARE_PROVIDER_SITE_OTHER)
Admission: RE | Admit: 2018-12-27 | Discharge: 2018-12-27 | Disposition: A | Discharge status: Home / Self Care | Payer: Medicare HMO | Source: Ambulatory Visit | Attending: Nurse Practitioner | Admitting: Nurse Practitioner

## 2018-12-27 DIAGNOSIS — J849 Interstitial pulmonary disease, unspecified: Secondary | ICD-10-CM

## 2018-12-27 DIAGNOSIS — R0602 Shortness of breath: Secondary | ICD-10-CM | POA: Diagnosis not present

## 2018-12-28 ENCOUNTER — Encounter (HOSPITAL_BASED_OUTPATIENT_CLINIC_OR_DEPARTMENT_OTHER): Payer: Medicare HMO

## 2018-12-30 ENCOUNTER — Other Ambulatory Visit: Payer: Self-pay

## 2018-12-30 ENCOUNTER — Ambulatory Visit (HOSPITAL_COMMUNITY)
Admission: RE | Admit: 2018-12-30 | Discharge: 2018-12-30 | Disposition: A | Discharge status: Home / Self Care | Payer: Medicare HMO | Source: Ambulatory Visit | Attending: Cardiology | Admitting: Cardiology

## 2018-12-30 ENCOUNTER — Encounter (HOSPITAL_COMMUNITY): Payer: Self-pay

## 2018-12-30 DIAGNOSIS — I5022 Chronic systolic (congestive) heart failure: Secondary | ICD-10-CM | POA: Diagnosis not present

## 2018-12-30 DIAGNOSIS — I255 Ischemic cardiomyopathy: Secondary | ICD-10-CM

## 2018-12-30 NOTE — Progress Notes (Addendum)
Called patient, discussed AVS, verbalized understanding.  Appt made for labs this week. Information sent on my chart   Lab appointment July 1st 12:30p We will only contact you if something comes back abnormal or we need to make some changes. Otherwise no news is good news!  Your physician recommends that you schedule a follow-up appointment in: 3 months with Dr [name]. You will get a phone call to schedule this appointment.

## 2018-12-30 NOTE — Patient Instructions (Addendum)
Lab appointment July 1st 12:30p We will only contact you if something comes back abnormal or we need to make some changes. Otherwise no news is good news!  Your physician recommends that you schedule a follow-up appointment in: 3 months with Dr [name]. You will get a phone call to schedule this appointment.

## 2018-12-31 NOTE — Progress Notes (Signed)
Telehealth visit Due to national recommendations of social distancing due to [place] 19, Audio/video telehealth visit is felt to be most appropriate for this patient at this time.  See MyChart message from today for patient consent regarding telehealth for [name].  Date:  12/31/2018   ID:  Vincent Chandler, DOB 03-14-1961, MRN [id number]  Location: Home  Provider location:  Advanced Heart Failure Type of Visit: Established patient  PCP:  Default, Provider, MD  Cardiologist: Dr. [name]  Chief Complaint: Exertional shortness of breath.   History of Present Illness: Vincent Chandler is a 58 y.o. male who presents via audio/video conferencing for a telehealth visit today.     he denies symptoms worrisome for COVID 19.   Patient has a history of A flutter, chronic systolic heart failure, CAD s/p CABG, CKD Stage III, DMII, OSA   Admitted 08/07/17 with atrial flutter/RVR and acute on chronic systolic heart failure. Underwent successful DC/CV on 2/8. Required short term milrinone but was able to wean off. HF meds started. Echo this admission with EF 15%. D/C weight 201 pounds.  CPX in 2/19 showed severe functional impairment due to heart failure. However, PFTs were restrictive and high resolution CT chest was concerning for interstitial lung disease.   He has had atrial flutter ablation.   He had RHC in 4/19 showing preserved cardiac output and low filling pressures, but moderate pulmonary hypertension.   He saw pulmonary regarding interstitial lung disease and had bronchoscopy.  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  He has been started on prednisone, cough is much improved on prednisone.    He saw Dr. Caryl Comes, not planning to upgrade ICD to CRT given IVCD but not LBBB-like.   He had a Barostim device placed in 1/20.   I tried him on [place] but he developed orthostasis after starting it and had to stop.   He has been on prednisone for suspected  amiodarone pulmonary toxicity.  He has had a chronic cough.   With worsened dyspnea, I took him for RHC in 6/20.  This showed normal filling pressures and low but not markedly low cardiac output. High resolution CT chest in 6/20 showed interstitial lung disease in UIP patttern. He saw pulmonary and prednisone was increased then tapered back down.   Cough and dyspnea improved with prednisone burst. He is using oxygen with exertion and at night now.  He can walk about 500 yards then gets short of breath. Short of breath hitting golf balls. No orthopnea/PND. No chest pain.    Labs (2/19): free T4 increased but free T3 normal, LDL 86, HDL 30 Labs (3/19): K 4.9, creatinine 1.53, hgb 14.3, LDL 52, ANA negative, RF negative Labs (4/19): K 4.2, creatinine 1.27 Labs (5/19): LDL 69, K 4.4, creatinine 1.36 Labs (8/19): K 5.3, creatinine 2.2 Labs (10/19): K 5, creatinine 1.49 Labs (12/19): hgb 13.4 Labs (2/20): K 4.2, creatinine 1.45, hgb 14.3 Labs (5/20): K 4.2, creatinine 1.81, digoxin 0.6, LDL 16, TGs 396, hgb 13.8 Labs (6/20): creatinine 2  PMH:  1. Atrial fibrillation: Noted post-op CABG.  2. Atrial flutter: Noted during 2/19 admission, DCCV done.  S/p Ablation 3/19.  3. CAD: s/p CABG.  - LHC (1/16):  Patent LIMA-LAD and SVG-D. 80% mLCx and 99% PLOM, total occlusion of SVG-PLOM.  Total occlusion of RCA with occlusion of SVG-RCA.  Patient had DES to mLCx-PLOM.  4. HTN 5. Type II diabetes  6. Hyperlipidemia 7. Chronic systolic  CHF: Ischemic cardiomyopathy.   - Echo (2/19): EF 15%, mildly dilated LV, mild LVH, inferolateral akinesis, milr MR, mildly dilated RV with severely reduced systolic function.  - CPX (2/19): Peak VO2 12.2, VE/VCO2 slope 53, RER 1.07 => severe limitation from heart failure.  - Medtronic ICD  - RHC (4/19): mean RA 5, PA 54/18 mean 31, mean PCWP 12, CI 2.5, PVR 3.6 WU - Echo (1/20): EF 20% - Barostimulator device placed in 1/20.  - RHC (6/20): mean RA 6, PA 42/17 mean 27,  mean PCWP 12, CI 2.29, PVR 3.1  8. Interstitial lung disease: PFTs (2/19) were restrictive, raising concern for ILD.  - High resolution CT chest in 2/19: interstitial lung disease concerning for usual interstitial pneumonitis. - Bronchoscopy was done, possible amiodarone lung toxicity causing ILD, now on prednisone.  - High resolution chest CT (6/20): ILD in UIP pattern.  9. CKD stage 3 10. OSA: Home sleep study in 2020 with severe OSA.  11. ABIs (2/19): not significantly abnormal 12. Carotid dopplers (2/19): Mild disease only.  - Carotid dopplers (1/20): Minimal stenosis.  13. Pulmonary hypertension: ?Group 3 due to ILD.  14. Nephrolithiasis   Current Outpatient Medications  Medication Sig Dispense Refill  . ACCU-CHEK FASTCLIX LANCETS MISC Use as directed once a day.  Dx code: E11.9 100 each 1  . albuterol (ACCUNEB) 0.63 MG/3ML nebulizer solution Take 3 mLs (0.63 mg total) by nebulization every 6 (six) hours as needed for wheezing. (Patient not taking: Reported on 12/10/2018) 75 mL 12  . albuterol (PROAIR HFA) 108 (90 Base) MCG/ACT inhaler Inhale 1-2 puffs into the lungs every 6 (six) hours as needed for wheezing or shortness of breath. (Patient taking differently: Inhale 2 puffs into the lungs every 6 (six) hours as needed for wheezing or shortness of breath. ) 1 Inhaler 3  . atorvastatin (LIPITOR) 40 MG tablet TAKE 1 TABLET (40 MG TOTAL) BY MOUTH AT BEDTIME. 90 tablet 3  . blood glucose meter kit and supplies Dispense based on patient and insurance preference. Use as directed once a day. Dx Code E11.9. 1 each 0  . Blood Glucose Monitoring Suppl (ACCU-CHEK GUIDE) w/Device KIT 1 each by Does not apply route daily. DX Code: E11.9 1 kit 0  . carvedilol (COREG) 12.5 MG tablet Take 2 tablets (25 mg total) by mouth 2 (two) times daily with a meal. (Patient taking differently: Take 12.5 mg by mouth 2 (two) times daily with a meal. ) 180 tablet 3  . digoxin (LANOXIN) 0.125 MG tablet TAKE 1/2 TABLET BY  MOUTH DAILY. (Patient taking differently: Take 0.0625 mg by mouth daily. ) 15 tablet 5  . ELIQUIS 5 MG TABS tablet TAKE 1 TABLET BY MOUTH 2 TIMES DAILY. (Patient taking differently: Take 5 mg by mouth 2 (two) times daily. ) 60 tablet 5  . furosemide (LASIX) 20 MG tablet Take 2 tablets (40 mg total) by mouth daily. 30 tablet 11  . glucose blood (ACCU-CHEK GUIDE) test strip Use as directed once a day.  Dx code: E11.9 100 each 1  . guaiFENesin (MUCINEX) 600 MG 12 hr tablet Take 1 tablet (600 mg total) by mouth 2 (two) times daily as needed for to loosen phlegm.    . metFORMIN (GLUCOPHAGE) 500 MG tablet TAKE 1 TABLET BY MOUTH 2 TIMES DAILY WITH A MEAL. (Patient taking differently: Take 500 mg by mouth 2 (two) times daily with a meal. ) 60 tablet 0  . mometasone-formoterol (DULERA) 200-5 MCG/ACT AERO Inhale 2 puffs  into the lungs 2 (two) times a day. 2 Inhaler 0  . nitroGLYCERIN (NITROSTAT) 0.4 MG SL tablet Place 1 tablet (0.4 mg total) under the tongue every 5 (five) minutes as needed for chest pain. 25 tablet 3  . Polyethyl Glycol-Propyl Glycol (SYSTANE) 0.4-0.3 % SOLN Place 1 drop into both eyes daily.    . predniSONE (DELTASONE) 10 MG tablet Take 1 tablet (10 mg total) by mouth daily. 90 tablet 3  . predniSONE (DELTASONE) 10 MG tablet Take 4 tabs for 2 days, then 3 tabs for 2 days, then 2 tabs for 2 days, then 1 tab for 2 days, then stop (Patient taking differently: Take 10-40 mg by mouth See admin instructions. Take 40 mg by mouth daily for 2 days, then 30 mg by mouth daily for 2 days, then 20 mg by mouth daily for 2 days, then 10 mg by mouth daily for 2 days, then stop) 20 tablet 0  . sacubitril-valsartan (ENTRESTO) 49-51 MG Take 1 tablet by mouth 2 (two) times daily. 60 tablet 0  . spironolactone (ALDACTONE) 25 MG tablet Take 1 tablet (25 mg total) by mouth daily. 90 tablet 3   No current facility-administered medications for this encounter.     Allergies:   Patient has no known allergies.    Social History:  The patient  reports that he has never smoked. His smokeless tobacco use includes chew. He reports current alcohol use of about 3.0 standard drinks of alcohol per week. He reports that he does not use drugs.   Family History:  The patient's family history includes Diabetes in his father and mother; Hyperlipidemia in his father; Hypertension in his father and mother.   ROS:  Please see the history of present illness.   All other systems are personally reviewed and negative.   Exam:    (Video/Tele Health Call; Exam is subjective and or/visual.) General: Speaks in full sentences. No resp difficulty. Neck: Thick, JVP does not appear elevated.  Lungs: Normal respiratory effort with conversation.  Abdomen: Non-distended per patient report Extremities: Pt denies edema. Neuro: Alert & oriented x 3.   Recent Labs: 07/24/2018: ALT 29 11/14/2018: Platelets 180 12/03/2018: B Natriuretic Peptide 288.2; BUN 32 12/11/2018: Creatinine, Ser 2.00; Hemoglobin 15.0; Hemoglobin 15.3; Potassium 4.1; Potassium 3.9; Sodium 139; Sodium 139  Personally reviewed   Wt Readings from Last 3 Encounters:  12/11/18 93 kg (205 lb)  12/04/18 94.3 kg (208 lb)  12/03/18 94 kg (207 lb 3.2 oz)      ASSESSMENT AND PLAN:  1. Chronic systolic CHF: Ischemic cardiomyopathy. Medtronic ICD single chamber. 2/19 admission for decompensated HF requiring milrinone. Echo 2/7/019 EF 15% Mildly dilated RV. CPX in 2/19 was suggestive of severe limitation due to HF.   RHC in 4/19 showed normal filling pressures and preserved cardiac output.  Echo in 1/20 with EF 20%.  He had a Barostimulator device placed in 1/20.  RHC in 6/20 showed normal filling pressures, CI 2.29.  NYHA class III symptoms. RHC did not suggest volume overload, suspect dyspnea at this point may primarily be due to interstitial lung disease.  - Continue Coreg 12.5 mg bid (dose decreased due to orthostasis).   - Continue digoxin 0.0625 mg daily, will  arrange to check digoxin level.  - Continue Entresto 49/51 bid.  - Continue spironolactone 25 mg daily.    - Continue Lasix 40 mg daily.  I will arrange for BMET.  - Has IVCD, not LBBB-like.  Saw Dr. Caryl Comes, will not  upgrade ICD to CRT.  - He may be a future LVAD candidate.   2. Afib/ Atrial flutter: Paroxysmal.  Admitted in 2/19 with severe HF decompensation in setting of atrial flutter with RVR.  He was cardioverted.  He then had atrial flutter ablation in 3/19.  He is off amiodarone due to question of amiodarone lung toxicity. No palpitations.   - He is now off amiodarone (question of lung toxicity, would not re-challenge in future).   - Continue Eliquis 5 mg bid. Denies bleeding.  3. CAD: S/p CABG.  Last intervention was PCI to LCx in 2016.  No chest pain.   - Continue atorvastatin, good LDL in 5/20.  - Triglycerides high, now on Vascepa. This needs to be refilled and will recheck lipids at next appt.    - No ASA with Eliquis use.  4. Pulmonary fibrosis: PFTs in 2/19 were restrictive, and high resolution CT showed interstitial lung disease. Amiodarone was stopped due to concern for possible toxicity.  He has seen [name] => bronchoscopy suggestive of ILD related to amiodarone.  He has been on chronic prednisone.  High resolution CT chest repeated in 6/20, again showing ILD, described at UIP pattern. Symptoms improved with prednisone burst.  - Continue pulmonary followup.   5. OSA: Does not have CPAP, needs to reschedule sleep study.   6. CKD: Stage 3. I will arrange for BMET.  7. Pulmonary hypertension: Suspect group 3 PH due to lung disease (ILD).  8. DM2: He did not tolerate [place]   COVID screen The patient does not have any symptoms that suggest any further testing/ screening at this time.  Social distancing reinforced today.  Relevant cardiac medications were reviewed at length with the patient today.   The patient does not have concerns regarding their medications at this  time.   Recommended follow-up:  2-3 months  Today, I have spent 17 minutes with the patient with telehealth technology discussing the above issues .    Signed, Loralie Champagne, MD  12/31/2018  [place] [address] [facility] [place] [address] [phone] (office) [phone] (fax)

## 2019-01-01 ENCOUNTER — Ambulatory Visit (HOSPITAL_COMMUNITY): Admission: RE | Admit: 2019-01-01 | Payer: Medicare HMO | Source: Ambulatory Visit

## 2019-01-03 DIAGNOSIS — I2510 Atherosclerotic heart disease of native coronary artery without angina pectoris: Secondary | ICD-10-CM | POA: Diagnosis not present

## 2019-01-03 DIAGNOSIS — J849 Interstitial pulmonary disease, unspecified: Secondary | ICD-10-CM | POA: Diagnosis not present

## 2019-01-03 DIAGNOSIS — I509 Heart failure, unspecified: Secondary | ICD-10-CM | POA: Diagnosis not present

## 2019-01-03 DIAGNOSIS — J45909 Unspecified asthma, uncomplicated: Secondary | ICD-10-CM | POA: Diagnosis not present

## 2019-01-06 ENCOUNTER — Other Ambulatory Visit: Payer: Self-pay

## 2019-01-06 ENCOUNTER — Other Ambulatory Visit: Payer: Self-pay | Admitting: Internal Medicine

## 2019-01-06 ENCOUNTER — Ambulatory Visit (INDEPENDENT_AMBULATORY_CARE_PROVIDER_SITE_OTHER): Payer: Medicare HMO

## 2019-01-06 DIAGNOSIS — R0609 Other forms of dyspnea: Secondary | ICD-10-CM

## 2019-01-06 DIAGNOSIS — J849 Interstitial pulmonary disease, unspecified: Secondary | ICD-10-CM

## 2019-01-06 DIAGNOSIS — R0600 Dyspnea, unspecified: Secondary | ICD-10-CM

## 2019-01-06 NOTE — Progress Notes (Signed)
SIX MIN WALK 01/06/2019 12/04/2018 10/30/2017 09/25/2017  Medications no medication - - -  Supplimental Oxygen during Test? (L/min) No Yes No No  O2 Flow Rate - 3 - -  Type - Continuous - -  Laps 12 - - -  Partial Lap (in Meters) 14.5 - - -  Baseline BP (sitting) 106/74 - - -  Baseline Heartrate 88 - - -  Baseline Dyspnea (Borg Scale) 2 - - -  Baseline Fatigue (Borg Scale) 3 - - -  Baseline SPO2 91 - - -  BP (sitting) 124/64 - - -  Heartrate 113 - - -  Dyspnea (Borg Scale) 4 - - -  Fatigue (Borg Scale) 3 - - -  SPO2 87 - - -  BP (sitting) 112/68 - - -  Heartrate 89 - - -  SPO2 93 - - -  Stopped or Paused before Six Minutes No - - -  Distance Completed 422.5 - - -  Tech Comments: patient completed test at a moderate pace.  patient did complain of dyspnea but attributed this to wearing his mask during the walk.  at end of test, upon viewing the low O2 sat, obtained forehead probe to double-check > showed O2 at 92%. Patient started without O2. Patient had to stop briefly during walk in order for oxygen to be turned on during the 2nd lap. O2 level increased once on 3L and able to maintain 93% during remainder of the walk. Pt walked at a moderate pace completing all required laps. Pt denied any complaints Pt walked at a normal pace completing all required laps. Denied any complaints.

## 2019-01-07 ENCOUNTER — Telehealth: Payer: Self-pay | Admitting: Nurse Practitioner

## 2019-01-07 NOTE — Telephone Encounter (Signed)
Patient in the office yesterday 01/06/2019 and requested the results of his recent home sleep study.  The results have been scanned into the chart but no interpretation for the staff to call the patient.  Routing to [place] NP whom is the ordering provider.  Please advise, thank you.

## 2019-01-08 ENCOUNTER — Telehealth (HOSPITAL_COMMUNITY): Payer: Self-pay

## 2019-01-08 MED FILL — DIGOXIN 0.125 MG TABLET: 125 | 30 days supply | Qty: 15 | Fill #1

## 2019-01-08 MED FILL — FUROSEMIDE 20 MG TABS: 20 | 15 days supply | Qty: 30 | Fill #0

## 2019-01-08 NOTE — Telephone Encounter (Signed)
Spoke with pt. He is aware of Tonya's respone. Due to the pt's work schedule, he can't physically come in for an appointment. Pt has been scheduled for a televisit on 01/09/2019 at 1400 per his request. Nothing further was needed.

## 2019-01-08 NOTE — Telephone Encounter (Signed)
Pt called requesting samples for eliquis 48m. afib clinic supplied 1 box. Pt advised to call next week for additional box if need. Will pick up tomorrow from front desk  Medication Samples have been provided to the patient.  Drug name: eliquis       Strength: 555m       Qty: 14  LOT: [id number].Date: 9/22  Dosing instructions: 1 tab twice a day  The patient has been instructed regarding the correct time, dose, and frequency of taking this medication, including desired effects and most common side effects.   TrValeda Malm:51 PM 01/08/2019

## 2019-01-08 NOTE — Telephone Encounter (Signed)
Please try to schedule patient for tomorrow or Friday to discuss results and discuss treatment options. Thanks.

## 2019-01-09 ENCOUNTER — Other Ambulatory Visit: Payer: Self-pay

## 2019-01-09 ENCOUNTER — Ambulatory Visit (INDEPENDENT_AMBULATORY_CARE_PROVIDER_SITE_OTHER): Payer: Medicare HMO | Admitting: Nurse Practitioner

## 2019-01-09 ENCOUNTER — Encounter: Payer: Self-pay | Admitting: Nurse Practitioner

## 2019-01-09 DIAGNOSIS — G4733 Obstructive sleep apnea (adult) (pediatric): Secondary | ICD-10-CM
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
## 2019-01-09 NOTE — Patient Instructions (Signed)
ILD: Continue Dulera Continue albuterol as needed Continue albuterol neb treatment as needed  Continue home O2 to use with exertion at 2- 3 L to keep sats above 90% Continue 3 L O2 at night  OSA: Will order CPAP Auto Set 5-15 cm H20 Continue current medications Goal of 4 hours or more usage per night Maintain healthy weight Do not drive if drowsy  Will refer to Dr. Elsworth Soho or Dr. Halford Chessman to establish care 1 month after starting CPAP for OSA    Follow up in 1 month with [name] or APP - sooner if needed Patient will need PFT and 6 minute walk scheduled
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
## 2019-01-13 ENCOUNTER — Other Ambulatory Visit (HOSPITAL_COMMUNITY)
Admission: RE | Admit: 2019-01-13 | Discharge: 2019-01-13 | Disposition: A | Discharge status: Home / Self Care | Payer: Medicare HMO | Source: Ambulatory Visit | Attending: Internal Medicine | Admitting: Internal Medicine

## 2019-01-13 DIAGNOSIS — Z1159 Encounter for screening for other viral diseases: Secondary | ICD-10-CM | POA: Insufficient documentation

## 2019-01-14 LAB — SARS CORONAVIRUS 2 (TAT 6-24 HRS): SARS Coronavirus 2: NEGATIVE

## 2019-01-16 ENCOUNTER — Ambulatory Visit (INDEPENDENT_AMBULATORY_CARE_PROVIDER_SITE_OTHER): Payer: Medicare HMO | Admitting: Nurse Practitioner

## 2019-01-16 ENCOUNTER — Ambulatory Visit (INDEPENDENT_AMBULATORY_CARE_PROVIDER_SITE_OTHER): Payer: Medicare HMO | Admitting: Internal Medicine

## 2019-01-16 ENCOUNTER — Other Ambulatory Visit: Payer: Self-pay

## 2019-01-16 ENCOUNTER — Encounter: Payer: Self-pay | Admitting: Nurse Practitioner

## 2019-01-16 ENCOUNTER — Telehealth (HOSPITAL_COMMUNITY): Payer: Self-pay | Admitting: *Deleted

## 2019-01-16 ENCOUNTER — Other Ambulatory Visit: Payer: Self-pay | Admitting: Family Medicine

## 2019-01-16 DIAGNOSIS — E1151 Type 2 diabetes mellitus with diabetic peripheral angiopathy without gangrene: Secondary | ICD-10-CM

## 2019-01-16 DIAGNOSIS — J849 Interstitial pulmonary disease, unspecified: Secondary | ICD-10-CM

## 2019-01-16 LAB — PULMONARY FUNCTION TEST
DL/VA % pred: 61 %
DL/VA: 2.62 ml/min/mmHg/L
DLCO unc % pred: 39 %
DLCO unc: 11.18 ml/min/mmHg
FEF 25-75 Post: 2.4 L/sec
FEF 25-75 Pre: 2.51 L/sec
FEF2575-%Change-Post: -4 %
FEF2575-%Pred-Post: 76 %
FEF2575-%Pred-Pre: 80 %
FEV1-%Change-Post: 0 %
FEV1-%Pred-Post: 58 %
FEV1-%Pred-Pre: 58 %
FEV1-Post: 2.16 L
FEV1-Pre: 2.16 L
FEV1FVC-%Change-Post: 0 %
FEV1FVC-%Pred-Pre: 110 %
FEV6-%Change-Post: 0 %
FEV6-%Pred-Post: 55 %
FEV6-%Pred-Pre: 55 %
FEV6-Post: 2.59 L
FEV6-Pre: 2.58 L
FEV6FVC-%Pred-Post: 104 %
FEV6FVC-%Pred-Pre: 104 %
FVC-%Change-Post: 0 %
FVC-%Pred-Post: 53 %
FVC-%Pred-Pre: 52 %
FVC-Post: 2.59 L
FVC-Pre: 2.58 L
Post FEV1/FVC ratio: 83 %
Post FEV6/FVC ratio: 100 %
Pre FEV1/FVC ratio: 84 %
Pre FEV6/FVC Ratio: 100 %
RV % pred: 41 %
RV: 0.9 L
TLC % pred: 57 %
TLC: 4 L

## 2019-01-16 NOTE — Progress Notes (Signed)
_0  ID: Vincent Chandler, male    DOB: Sep 15, 1960, 58 y.o.   MRN: [id number]  Chief Complaint  Patient presents with   Results    Discuss results of PFT    Referring provider: No ref. provider found  HPI 58 year old male never smoker with ILD (history of amiodarone therapy), chronic cough is followed by [name]. SAY:[id number] systolic heart failure, CAD s/p CABG, CKD stage III, DM type II, OSA(previously on CPAP),asthma, a-fib/atrial flutter (on eliqius).   Tests: CXR 12/03/18 -Pulmonary fibrosis. Enlargement of cardiac silhouette post CABG and AICD. No acute abnormalities. 09/25/2017 =>walk test - Walking desaturation test on 09/25/2017 185 feet x 3 laps on ROOM AIR: did walk all 3 at normal pace. Did not have complaints. Didnot desaturate. Rest pulse ox was 98%, final pulse ox was 90%. HR response 72/min at rest to 88/min at peak exertion. Patient Vincent Chandler Did no6 Desaturate <88% . Vincent Chandler did yes Desaturated </= 3% points. Vincent Chandler did not get tachyardic. These features are c/w MILD/EARLY ILD 09/17/17 - >HRCT - read by Thoracic radiology as Probable UIP(>50-80% this is UIP) ECHO TEE 08/20/17 - ef 15% (CPST 08/24/17 - high risk CHF features with Vo2 max 12 and also desaturation <90%) Walking desaturation test on 10/30/2017 185 feet x 3 laps on ROOM AIR: did not Desaturate to <88%. Rest pulse ox was 97%, final pulse ox was 90%. HR response 60/min at rest to 70/min at peak exertion. Patient [name] Did not Desaturate <88% . Vincent Chandler did yes Desaturated </= 3% points. Vincent Chandler did not get tachyardic. Moderate pace . Denied complaints Results for Vincent, Chandler (MRN [id number]) as of 10/30/2017 17:01  Ref. Range 09/25/2017 17:03  ASPERGILLUS FUMIGATUS Latest Ref Range: NEGATIVE  NEGATIVE  Pigeon Serum Latest Ref Range: NEGATIVE  NEGATIVE  Anit Nuclear Antibody(ANA) Latest Ref Range: Negative  Negative  Cyclic Citrullin Peptide Ab Latest Units:  UNITS <16  ds DNA Ab Latest Units: IU/mL 5 (H)  Myeloperoxidase Abs Latest Units: AI <1.0  Serine Protease 3 Latest Units: AI 2.9 (H)  RA Latex Turbid. Latest Ref Range: <14 IU/mL <14  SSA (Ro) (ENA) Antibody, IgG Latest Ref Range: <1.0 NEG AI <1.0 NEG  SSB (La) (ENA) Antibody, IgG Latest Ref Range: <1.0 NEG AI <1.0 NEG  Scleroderma (Scl-70) (ENA) Antibody, IgG Latest Ref Range: <1.0 NEG AI <1.0 NEG   Results for Vincent, Chandler (MRN [id number]) as of 10/30/2017 17:01  Ref. Range 08/16/2017 14:34 10/30/2017 14:45  FVC-Pre Latest Units: L 2.89 2.81  FVC-%Pred-Pre Latest Units: % 61 59    Results for Vincent, Chandler (MRN [id number]) as of 10/30/2017 17:01  Ref. Range 08/16/2017 14:34 10/30/2017 14:45  TLC Latest Units: L 4.09 3.85  TLC % pred Latest Units: % 60 56   Results for Vincent, Chandler (MRN [id number]) as of 10/30/2017 17:01  Ref. Range 09/25/2017 17:03  Sed Rate Latest Ref Range: 0 - 20 mm/hr 18   RHC 10/03/2017 RA mean 5 RV 51/5 PA 54/18, mean 31 PCWP mean 12 Oxygen saturations: PA 66% AO 96% Cardiac Output (Fick) 5.22  Cardiac Index (Fick) 2.51 PVR 3.6 WU  11/28/17 -bronchoscopy and bronchoalveolar lavage. Results show that he has 70% macro phage [normal is greater than 90% macrophage) and 8% eosinophils [normal is less than 5% eosinophils)- suspecteddrug-induced pneumonitis - possibly amiodarone   HST 12/22/18 - AHI 54.2, SpO2 low at 60%  PFT Results Latest Ref Rng & Units 01/16/2019 10/30/2017 08/16/2017  FVC-Pre L 2.58  2.81 2.89  FVC-Predicted Pre % 52 59 61  FVC-Post L 2.59 2.84 2.86  FVC-Predicted Post % 53 60 60  Pre FEV1/FVC % % 84 84 84  Post FEV1/FCV % % 83 84 86  FEV1-Pre L 2.16 2.35 2.44  FEV1-Predicted Pre % 58 65 67  FEV1-Post L 2.16 2.39 2.47  DLCO UNC% % 39 28 26  DLCO COR %Predicted % 61 52 45  TLC L 4.00 3.85 4.09  TLC % Predicted % 57 56 60  RV % Predicted % 41 56 54     6 minute walk:  SIX MIN WALK 01/06/2019 12/04/2018 10/30/2017 09/25/2017    Medications no medication - - -  Supplimental Oxygen during Test? (L/min) No Yes No No  O2 Flow Rate - 3 - -  Type - Continuous - -  Laps 12 - - -  Partial Lap (in Meters) 14.5 - - -  Baseline BP (sitting) 106/74 - - -  Baseline Heartrate 88 - - -  Baseline Dyspnea (Borg Scale) 2 - - -  Baseline Fatigue (Borg Scale) 3 - - -  Baseline SPO2 91 - - -  BP (sitting) 124/64 - - -  Heartrate 113 - - -  Dyspnea (Borg Scale) 4 - - -  Fatigue (Borg Scale) 3 - - -  SPO2 87 - - -  BP (sitting) 112/68 - - -  Heartrate 89 - - -  SPO2 93 - - -  Stopped or Paused before Six Minutes No - - -  Distance Completed 422.5 - - -  Tech Comments: patient completed test at a moderate pace.  patient did complain of dyspnea but attributed this to wearing his mask during the walk.  at end of test, upon viewing the low O2 sat, obtained forehead probe to double-check > showed O2 at 92%. Patient started without O2. Patient had to stop briefly during walk in order for oxygen to be turned on during the 2nd lap. O2 level increased once on 3L and able to maintain 93% during remainder of the walk. Pt walked at a moderate pace completing all required laps. Pt denied any complaints Pt walked at a normal pace completing all required laps. Denied any complaints      OV 01/16/19 - Follow up Patient has a follow-up with PFT today.  His PFT was overall stable compared to last PFT but DLCO was slightly improved.  Patient states that he has been doing well.  Oxygen during the day.  He does still wear oxygen at 3 L at night.  He is scheduled to get a new CPAP machine on 01/23/2019.  His recent sleep study did show severe sleep apnea.  Patient denies any significant shortness of breath or cough.  He does complain of generalized weakness and fatigue during the day.  He did recently undergo a heart cath and reports that cardiology stated it was normal.  He did have a recent 6-minute walk which was improved from previous walk on 12/04/2018.   Patient is compliant with Dulera and has albuterol as needed.  Patient had a recent CT scan that did show findings that remain compatible with interstitial lung disease.  Patient is still currently on prednisone daily.  Denies f/c/s, n/v/d, hemoptysis, PND, leg swelling.      No Known Allergies  Immunization History  Administered Date(s) Administered   Influenza Inj Mdck Quad Pf 06/14/2018   Influenza Split 06/05/2012   Influenza Whole 05/09/2010   Influenza,inj,Quad PF,6+ Mos 04/09/2013,  06/05/2014, 08/17/2017   Influenza-Unspecified 04/03/2015   Pneumococcal Conjugate-13 06/05/2014   Pneumococcal Polysaccharide-23 02/17/2012   Td 05/09/2010    Past Medical History:  Diagnosis Date   AICD (automatic cardioverter/defibrillator) present    Anginal pain (HCC)    Anxiety    Asthma    Atrial fibrillation-postoperative 11/28/2012   Cardiomyopathy    alcohol use related   CHF (congestive heart failure) (HCC)    Chronic back pain    Coronary artery disease    drug eluting stent RCA 2005-EF 30%- s/p CABG x 4; 2/4 patent grafts with SVG-PLOM and SVG-RCA system totally occluded. There are collaterals from the LAD system to the RCA and the RCA is totally occluded proximally. There are no collaterals to the LCx territory. He then underwent successful PCI of the mid left circumflex artery with overlapping Synergy drug-eluting stents   Depression    pt denies   Diabetes mellitus type II dx'd in the 1990's   GERD (gastroesophageal reflux disease)    yrs ago   H/O hiatal hernia    Hemorrhoids    History of hypogonadism    History of kidney stones    Hyperlipidemia    Hypertension    OSA on CPAP    "mask is broken; working on getting a new one" (01/15/2015; 10/03/2017)   Pneumonia    "3-4 times" (10/03/2017)   Urinary incontinence     Tobacco History: Social History   Tobacco Use  Smoking Status Never Smoker  Smokeless Tobacco Current User   Types:  Chew   Ready to quit: No Counseling given: Yes   Outpatient Encounter Medications as of 01/16/2019  Medication Sig   ACCU-CHEK FASTCLIX LANCETS MISC Use as directed once a day.  Dx code: E11.9   albuterol (ACCUNEB) 0.63 MG/3ML nebulizer solution Take 3 mLs (0.63 mg total) by nebulization every 6 (six) hours as needed for wheezing.   albuterol (PROAIR HFA) 108 (90 Base) MCG/ACT inhaler Inhale 1-2 puffs into the lungs every 6 (six) hours as needed for wheezing or shortness of breath. (Patient taking differently: Inhale 2 puffs into the lungs every 6 (six) hours as needed for wheezing or shortness of breath. )   atorvastatin (LIPITOR) 40 MG tablet TAKE 1 TABLET (40 MG TOTAL) BY MOUTH AT BEDTIME.   blood glucose meter kit and supplies Dispense based on patient and insurance preference. Use as directed once a day. Dx Code E11.9.   Blood Glucose Monitoring Suppl (ACCU-CHEK GUIDE) w/Device KIT 1 each by Does not apply route daily. DX Code: E11.9   carvedilol (COREG) 12.5 MG tablet Take 2 tablets (25 mg total) by mouth 2 (two) times daily with a meal. (Patient taking differently: Take 12.5 mg by mouth 2 (two) times daily with a meal. )   digoxin (LANOXIN) 0.125 MG tablet TAKE 1/2 TABLET BY MOUTH DAILY. (Patient taking differently: Take 0.0625 mg by mouth daily. )   ELIQUIS 5 MG TABS tablet TAKE 1 TABLET BY MOUTH 2 TIMES DAILY. (Patient taking differently: Take 5 mg by mouth 2 (two) times daily. )   furosemide (LASIX) 20 MG tablet Take 2 tablets (40 mg total) by mouth daily.   glucose blood (ACCU-CHEK GUIDE) test strip Use as directed once a day.  Dx code: E11.9   guaiFENesin (MUCINEX) 600 MG 12 hr tablet Take 1 tablet (600 mg total) by mouth 2 (two) times daily as needed for to loosen phlegm.   metFORMIN (GLUCOPHAGE) 500 MG tablet TAKE 1 TABLET BY MOUTH  2 TIMES DAILY WITH A MEAL. (Patient taking differently: Take 500 mg by mouth 2 (two) times daily with a meal. )   mometasone-formoterol  (DULERA) 200-5 MCG/ACT AERO Inhale 2 puffs into the lungs 2 (two) times a day.   nitroGLYCERIN (NITROSTAT) 0.4 MG SL tablet Place 1 tablet (0.4 mg total) under the tongue every 5 (five) minutes as needed for chest pain.   Polyethyl Glycol-Propyl Glycol (SYSTANE) 0.4-0.3 % SOLN Place 1 drop into both eyes daily.   predniSONE (DELTASONE) 10 MG tablet Take 1 tablet (10 mg total) by mouth daily.   predniSONE (DELTASONE) 10 MG tablet Take 4 tabs for 2 days, then 3 tabs for 2 days, then 2 tabs for 2 days, then 1 tab for 2 days, then stop (Patient taking differently: Take 10-40 mg by mouth See admin instructions. Take 40 mg by mouth daily for 2 days, then 30 mg by mouth daily for 2 days, then 20 mg by mouth daily for 2 days, then 10 mg by mouth daily for 2 days, then stop)   sacubitril-valsartan (ENTRESTO) 49-51 MG Take 1 tablet by mouth 2 (two) times daily.   spironolactone (ALDACTONE) 25 MG tablet Take 1 tablet (25 mg total) by mouth daily.   No facility-administered encounter medications on file as of 01/16/2019.      Review of Systems  Review of Systems  Constitutional: Negative.  Negative for chills and fever.  HENT: Negative.   Respiratory: Positive for shortness of breath (with exertion). Negative for cough and wheezing.   Cardiovascular: Negative.  Negative for chest pain, palpitations and leg swelling.  Gastrointestinal: Negative.   Allergic/Immunologic: Negative.   Neurological: Negative.   Psychiatric/Behavioral: Negative.        Physical Exam  BP 106/78 (BP Location: Right Arm, Patient Position: Sitting, Cuff Size: Normal)    Pulse 99    Temp 98.5 F (36.9 C)    Ht _0  (1.778 m)    Wt 204 lb 6.4 oz (92.7 kg)    SpO2 94%    BMI 29.33 kg/m   Wt Readings from Last 5 Encounters:  01/16/19 204 lb 6.4 oz (92.7 kg)  12/11/18 205 lb (93 kg)  12/04/18 208 lb (94.3 kg)  12/03/18 207 lb 3.2 oz (94 kg)  11/14/18 209 lb 12.8 oz (95.2 kg)     Physical Exam Vitals signs and  nursing note reviewed.  Constitutional:      General: He is not in acute distress.    Appearance: He is well-developed.  Cardiovascular:     Rate and Rhythm: Normal rate and regular rhythm.  Pulmonary:     Effort: Pulmonary effort is normal. No respiratory distress.     Breath sounds: Normal breath sounds. No wheezing or rhonchi.     Comments: Crackles to bilateral bases Skin:    General: Skin is warm and dry.  Neurological:     Mental Status: He is alert and oriented to person, place, and time.     Imaging: Ct Chest High Resolution  Result Date: 12/27/2018 CLINICAL DATA:  58 year old male with history of increasing shortness of breath. History of amiodarone use for the past year. EXAM: CT CHEST WITHOUT CONTRAST TECHNIQUE: Multidetector CT imaging of the chest was performed following the standard protocol without intravenous contrast. High resolution imaging of the lungs, as well as inspiratory and expiratory imaging, was performed. COMPARISON:  Chest CT 09/17/2017. FINDINGS: Cardiovascular: Heart size is normal. There is no significant pericardial fluid, thickening or pericardial calcification.  There is aortic atherosclerosis, as well as atherosclerosis of the great vessels of the mediastinum and the coronary arteries, including calcified atherosclerotic plaque in the left anterior descending, left circumflex and right coronary arteries. Status post median sternotomy for CABG including LIMA to the LAD. Aberrant right subclavian artery (normal anatomical variant) incidentally noted. Mediastinum/Nodes: No pathologically enlarged mediastinal or hilar lymph nodes. Prominent borderline enlarged lower middle mediastinal lymph node to the right of the esophagus (axial image 86 of series 2), decreased compared to the prior examination. Esophagus is unremarkable in appearance. No axillary lymphadenopathy. Lungs/Pleura: High-resolution images demonstrate widespread areas of ground-glass attenuation,  septal thickening, subpleural reticulation, thickening of the peribronchovascular interstitium, mild cylindrical traction bronchiectasis and regional areas of architectural distortion. No frank honeycombing is confidently identified at this time. Findings have a definitive craniocaudal gradient and appear minimally progressive compared to the prior study. Inspiratory and expiratory imaging is unremarkable. Upper Abdomen: Aortic atherosclerosis. Musculoskeletal: There are no aggressive appearing lytic or blastic lesions noted in the visualized portions of the skeleton. High-resolution images again demonstrate widespread areas of ground-glass attenuation, septal thickening, subpleural reticulation, thickening of the peribronchovascular interstitium and mild cylindrical traction bronchiectasis. IMPRESSION: 1. Findings in the lungs remain compatible with interstitial lung disease, with a pattern considered probable usual interstitial pneumonia (UIP) per current ATS guidelines. 2. Aortic atherosclerosis, in addition to 3 vessel coronary artery disease. Please note that although the presence of coronary artery calcium documents the presence of coronary artery disease, the severity of this disease and any potential stenosis cannot be assessed on this non-gated CT examination. Assessment for potential risk factor modification, dietary therapy or pharmacologic therapy may be warranted, if clinically indicated. Status post median sternotomy for CABG including LIMA to the LAD. Aortic Atherosclerosis (ICD10-I70.0). Electronically Signed   By: Vinnie Langton M.D.   On: 12/27/2018 09:29     Assessment & Plan:   ILD (interstitial lung disease) ([place]) Patient has a follow-up with PFT today.  His PFT was overall stable compared to last PFT but DLCO was slightly improved.  Patient states that he has been doing well.  Oxygen during the day.  He does still wear oxygen at 3 L at night.  He is scheduled to get a new CPAP machine  on 01/23/2019.  His recent sleep study did show severe sleep apnea.  Patient denies any significant shortness of breath or cough.  He does complain of generalized weakness and fatigue during the day.  He did recently undergo a heart cath and reports that cardiology stated it was normal.  He did have a recent 6-minute walk which was improved from previous walk on 12/04/2018.  Patient is compliant with Dulera and has albuterol as needed.  Patient had a recent CT scan that did show findings that remain compatible with interstitial lung disease.  Patient is still currently on prednisone daily.  Patient Instructions  ILD: Continue Dulera Continue albuterol as needed Continue albuterol neb treatment as needed PFT completed today 6 minute walk completed  Continue home O2 to use with exertion at 2- 3 L to keep sats above 90% Continue 3 L O2 at night  OSA: CPAP Auto Set 5-15 cm H20 ordered - will get CPAP on 01/23/19 Continue current medications Goal of 4 hours or more usage per night Maintain healthy weight Do not drive if drowsy  Will refer to Dr. Elsworth Soho or Dr. Halford Chessman to establish care 1 month after starting CPAP for OSA   Follow up with [name] at  first available apopintment in ILD clinic       Fenton Foy, NP 01/16/2019

## 2019-01-16 NOTE — Progress Notes (Signed)
Full PFT performed today.

## 2019-01-16 NOTE — Patient Instructions (Signed)
ILD: Continue Dulera Continue albuterol as needed Continue albuterol neb treatment as needed PFT completed today 6 minute walk completed  Continue home O2 to use with exertion at 2- 3 L to keep sats above 90% Continue 3 L O2 at night  OSA: CPAP Auto Set 5-15 cm H20 ordered - will get CPAP on 01/23/19 Continue current medications Goal of 4 hours or more usage per night Maintain healthy weight Do not drive if drowsy  Will refer to Dr. Elsworth Soho or Dr. Halford Chessman to establish care 1 month after starting CPAP for OSA   Follow up with [name] at first available apopintment in ILD clinic

## 2019-01-16 NOTE — Assessment & Plan Note (Signed)
Patient has a follow-up with PFT today.  His PFT was overall stable compared to last PFT but DLCO was slightly improved.  Patient states that he has been doing well.  Oxygen during the day.  He does still wear oxygen at 3 L at night.  He is scheduled to get a new CPAP machine on 01/23/2019.  His recent sleep study did show severe sleep apnea.  Patient denies any significant shortness of breath or cough.  He does complain of generalized weakness and fatigue during the day.  He did recently undergo a heart cath and reports that cardiology stated it was normal.  He did have a recent 6-minute walk which was improved from previous walk on 12/04/2018.  Patient is compliant with Dulera and has albuterol as needed.  Patient had a recent CT scan that did show findings that remain compatible with interstitial lung disease.  Patient is still currently on prednisone daily.  Patient Instructions  ILD: Continue Dulera Continue albuterol as needed Continue albuterol neb treatment as needed PFT completed today 6 minute walk completed  Continue home O2 to use with exertion at 2- 3 L to keep sats above 90% Continue 3 L O2 at night  OSA: CPAP Auto Set 5-15 cm H20 ordered - will get CPAP on 01/23/19 Continue current medications Goal of 4 hours or more usage per night Maintain healthy weight Do not drive if drowsy  Will refer to Dr. Elsworth Soho or Dr. Halford Chessman to establish care 1 month after starting CPAP for OSA   Follow up with [name] at first available apopintment in ILD clinic

## 2019-01-16 NOTE — Telephone Encounter (Signed)
eliquis samples left at the front desk per Dr.McLean request.

## 2019-01-20 ENCOUNTER — Encounter: Payer: Medicare HMO | Admitting: *Deleted

## 2019-01-20 MED FILL — metFORMIN HCL 500 MG TABS: 500 | 30 days supply | Qty: 60 | Fill #0

## 2019-01-21 ENCOUNTER — Other Ambulatory Visit: Payer: Self-pay | Admitting: Cardiology

## 2019-01-21 ENCOUNTER — Telehealth: Payer: Self-pay

## 2019-01-21 MED FILL — ENTRESTO 49 MG-51 MG TABLET: 49-51 | 30 days supply | Qty: 60 | Fill #0

## 2019-01-21 NOTE — Telephone Encounter (Signed)
Unable to leave message, vm not setup

## 2019-01-23 MED FILL — SPIRONOLACTONE 25 MG TABLET: 25 | 90 days supply | Qty: 90 | Fill #0

## 2019-01-23 MED FILL — FUROSEMIDE 20 MG TABS: 20 | 15 days supply | Qty: 30 | Fill #1

## 2019-01-23 MED FILL — CARVEDILOL 12.5 MG TABLET: 12.5 | 45 days supply | Qty: 180 | Fill #0

## 2019-01-27 DIAGNOSIS — G4733 Obstructive sleep apnea (adult) (pediatric): Secondary | ICD-10-CM | POA: Diagnosis not present

## 2019-01-27 MED FILL — ELIQUIS 5 MG TABLET: 5 | 30 days supply | Qty: 60 | Fill #1

## 2019-02-03 DIAGNOSIS — J849 Interstitial pulmonary disease, unspecified: Secondary | ICD-10-CM | POA: Diagnosis not present

## 2019-02-03 DIAGNOSIS — J45909 Unspecified asthma, uncomplicated: Secondary | ICD-10-CM | POA: Diagnosis not present

## 2019-02-03 DIAGNOSIS — I509 Heart failure, unspecified: Secondary | ICD-10-CM | POA: Diagnosis not present

## 2019-02-03 DIAGNOSIS — I2510 Atherosclerotic heart disease of native coronary artery without angina pectoris: Secondary | ICD-10-CM | POA: Diagnosis not present

## 2019-02-05 ENCOUNTER — Encounter: Payer: Self-pay | Admitting: Cardiology

## 2019-02-07 ENCOUNTER — Ambulatory Visit (INDEPENDENT_AMBULATORY_CARE_PROVIDER_SITE_OTHER): Payer: Medicare HMO | Admitting: Family Medicine

## 2019-02-07 ENCOUNTER — Encounter: Payer: Self-pay | Admitting: Family Medicine

## 2019-02-07 ENCOUNTER — Other Ambulatory Visit: Payer: Self-pay

## 2019-02-07 VITALS — BP 91/59 | HR 90 | Temp 98.0°F | Resp 18 | Ht 70.0 in | Wt 201.8 lb

## 2019-02-07 DIAGNOSIS — S91301A Unspecified open wound, right foot, initial encounter: Secondary | ICD-10-CM | POA: Diagnosis not present

## 2019-02-07 DIAGNOSIS — I10 Essential (primary) hypertension: Secondary | ICD-10-CM | POA: Diagnosis not present

## 2019-02-07 DIAGNOSIS — N182 Chronic kidney disease, stage 2 (mild): Secondary | ICD-10-CM | POA: Diagnosis not present

## 2019-02-07 DIAGNOSIS — E1169 Type 2 diabetes mellitus with other specified complication: Secondary | ICD-10-CM

## 2019-02-07 DIAGNOSIS — E1165 Type 2 diabetes mellitus with hyperglycemia: Secondary | ICD-10-CM

## 2019-02-07 DIAGNOSIS — Z23 Encounter for immunization: Secondary | ICD-10-CM

## 2019-02-07 DIAGNOSIS — E785 Hyperlipidemia, unspecified: Secondary | ICD-10-CM

## 2019-02-07 DIAGNOSIS — H538 Other visual disturbances: Secondary | ICD-10-CM

## 2019-02-07 LAB — GLUCOSE, POCT (MANUAL RESULT ENTRY): POC Glucose: 225 mg/dl — AB (ref 70–99)

## 2019-02-07 MED ORDER — DOXYCYCLINE HYCLATE 100 MG PO TABS: 100.0000 mg | ORAL_TABLET | Freq: Two times a day (BID) | ORAL | 0 refills | Status: DC

## 2019-02-07 MED ORDER — METFORMIN HCL 1000 MG PO TABS: 1000.0000 mg | ORAL_TABLET | Freq: Two times a day (BID) | ORAL | 3 refills | Status: DC

## 2019-02-07 NOTE — Progress Notes (Signed)
Patient ID: Vincent Chandler, male    DOB: 11/28/1960  Age: 59 y.o. MRN: [id number]    Subjective:  Subjective  HPI Vincent Chandler presents for f/u dm , chol and bp.  Pt c/o blurry vision-- he needs an eye dr.  His blood sugars are running high.  Her also c/o cut on the bottom of his R foot.  He is not sure how it happened.    Review of Systems  Constitutional: Negative for appetite change, diaphoresis, fatigue and unexpected weight change.  Eyes: Positive for visual disturbance. Negative for pain and redness.  Respiratory: Negative for cough, chest tightness, shortness of breath and wheezing.   Cardiovascular: Negative for chest pain, palpitations and leg swelling.  Endocrine: Negative for cold intolerance, heat intolerance, polydipsia, polyphagia and polyuria.  Genitourinary: Negative for difficulty urinating, dysuria and frequency.  Skin: Positive for wound.  Neurological: Negative for dizziness, light-headedness, numbness and headaches.    History Past Medical History:  Diagnosis Date  . AICD (automatic cardioverter/defibrillator) present   . Anginal pain ([place])   . Anxiety   . Asthma   . Atrial fibrillation-postoperative 11/28/2012  . Cardiomyopathy    alcohol use related  . CHF (congestive heart failure) ([place])   . Chronic back pain   . Coronary artery disease    drug eluting stent RCA 2005-EF 30%- s/p CABG x 4; 2/4 patent grafts with SVG-PLOM and SVG-RCA system totally occluded. There are collaterals from the LAD system to the RCA and the RCA is totally occluded proximally. There are no collaterals to the LCx territory. He then underwent successful PCI of the mid left circumflex artery with overlapping Synergy drug-eluting stents  . Depression    pt denies  . Diabetes mellitus type II dx'd in the 1990's  . GERD (gastroesophageal reflux disease)    yrs ago  . H/O hiatal hernia   . Hemorrhoids   . History of hypogonadism   . History of kidney stones   .  Hyperlipidemia   . Hypertension   . OSA on CPAP    "mask is broken; working on getting a new one" (01/15/2015; 10/03/2017)  . Pneumonia    "3-4 times" (10/03/2017)  . Urinary incontinence     He has a past surgical history that includes Tonsillectomy (~ 1970); Coronary angioplasty with stent (~ 2003); Refractive surgery (Bilateral, 1990's); Coronary artery bypass graft (N/A, 08/15/2012); left heart catheterization with coronary angiogram (N/A, 08/08/2012); left heart catheterization with coronary/graft angiogram (N/A, 07/21/2014); percutaneous coronary stent intervention (pci-s) (07/21/2014); Cardiac defibrillator placement (01/15/2015); Cardiac catheterization (N/A, 01/15/2015); TEE without cardioversion (N/A, 08/10/2017); Cardioversion (N/A, 08/10/2017); A-FLUTTER ABLATION (N/A, 09/12/2017); RIGHT HEART CATH (N/A, 10/03/2017); Multiple extractions with alveoloplasty (N/A, 10/04/2017); Video bronchoscopy (Bilateral, 11/28/2017); and RIGHT HEART CATH (N/A, 12/11/2018).   His family history includes Diabetes in his father and mother; Hyperlipidemia in his father; Hypertension in his father and mother.He reports that he has never smoked. His smokeless tobacco use includes chew. He reports current alcohol use of about 3.0 standard drinks of alcohol per week. He reports that he does not use drugs.  Current Outpatient Medications on File Prior to Visit  Medication Sig Dispense Refill  . ACCU-CHEK FASTCLIX LANCETS MISC Use as directed once a day.  Dx code: E11.9 100 each 1  . albuterol (ACCUNEB) 0.63 MG/3ML nebulizer solution Take 3 mLs (0.63 mg total) by nebulization every 6 (six) hours as needed for wheezing. 75 mL 12  . albuterol (PROAIR HFA) 108 (90  Base) MCG/ACT inhaler Inhale 1-2 puffs into the lungs every 6 (six) hours as needed for wheezing or shortness of breath. (Patient taking differently: Inhale 2 puffs into the lungs every 6 (six) hours as needed for wheezing or shortness of breath. ) 1 Inhaler 3  . atorvastatin  (LIPITOR) 40 MG tablet TAKE 1 TABLET (40 MG TOTAL) BY MOUTH AT BEDTIME. 90 tablet 3  . blood glucose meter kit and supplies Dispense based on patient and insurance preference. Use as directed once a day. Dx Code E11.9. 1 each 0  . Blood Glucose Monitoring Suppl (ACCU-CHEK GUIDE) w/Device KIT 1 each by Does not apply route daily. DX Code: E11.9 1 kit 0  . carvedilol (COREG) 12.5 MG tablet Take 2 tablets (25 mg total) by mouth 2 (two) times daily with a meal. (Patient taking differently: Take 12.5 mg by mouth 2 (two) times daily with a meal. ) 180 tablet 3  . digoxin (LANOXIN) 0.125 MG tablet TAKE 1/2 TABLET BY MOUTH DAILY. (Patient taking differently: Take 0.0625 mg by mouth daily. ) 15 tablet 5  . ELIQUIS 5 MG TABS tablet TAKE 1 TABLET BY MOUTH 2 TIMES DAILY. (Patient taking differently: Take 5 mg by mouth 2 (two) times daily. ) 60 tablet 5  . ENTRESTO 49-51 MG TAKE 1 TABLET BY MOUTH 2 TIMES DAILY. 60 tablet 2  . furosemide (LASIX) 20 MG tablet Take 2 tablets (40 mg total) by mouth daily. 30 tablet 11  . glucose blood (ACCU-CHEK GUIDE) test strip Use as directed once a day.  Dx code: E11.9 100 each 1  . guaiFENesin (MUCINEX) 600 MG 12 hr tablet Take 1 tablet (600 mg total) by mouth 2 (two) times daily as needed for to loosen phlegm.    . mometasone-formoterol (DULERA) 200-5 MCG/ACT AERO Inhale 2 puffs into the lungs 2 (two) times a day. 2 Inhaler 0  . nitroGLYCERIN (NITROSTAT) 0.4 MG SL tablet Place 1 tablet (0.4 mg total) under the tongue every 5 (five) minutes as needed for chest pain. 25 tablet 3  . Polyethyl Glycol-Propyl Glycol (SYSTANE) 0.4-0.3 % SOLN Place 1 drop into both eyes daily.    . predniSONE (DELTASONE) 10 MG tablet Take 1 tablet (10 mg total) by mouth daily. 90 tablet 3  . predniSONE (DELTASONE) 10 MG tablet Take 4 tabs for 2 days, then 3 tabs for 2 days, then 2 tabs for 2 days, then 1 tab for 2 days, then stop (Patient taking differently: Take 10-40 mg by mouth See admin instructions.  Take 40 mg by mouth daily for 2 days, then 30 mg by mouth daily for 2 days, then 20 mg by mouth daily for 2 days, then 10 mg by mouth daily for 2 days, then stop) 20 tablet 0  . spironolactone (ALDACTONE) 25 MG tablet Take 1 tablet (25 mg total) by mouth daily. 90 tablet 3   No current facility-administered medications on file prior to visit.      Objective:  Objective  Physical Exam Vitals signs and nursing note reviewed.  Constitutional:      General: He is sleeping.     Appearance: He is well-developed.  HENT:     Head: Normocephalic and atraumatic.  Eyes:     Pupils: Pupils are equal, round, and reactive to light.  Neck:     Musculoskeletal: Normal range of motion and neck supple.     Thyroid: No thyromegaly.  Cardiovascular:     Rate and Rhythm: Normal rate and regular rhythm.  Heart sounds: No murmur.  Pulmonary:     Effort: Pulmonary effort is normal. No respiratory distress.     Breath sounds: Normal breath sounds. No wheezing or rales.  Chest:     Chest wall: No tenderness.  Musculoskeletal:     Right foot: Tenderness and laceration present.       Feet:  Skin:    General: Skin is warm and dry.  Neurological:     Mental Status: He is oriented to person, place, and time.  Psychiatric:        Behavior: Behavior normal.        Thought Content: Thought content normal.        Judgment: Judgment normal.    BP (!) 91/59 (BP Location: Right Arm, Patient Position: Sitting, Cuff Size: Normal)   Pulse 90   Temp 98 F (36.7 C) (Oral)   Resp 18   Ht 5' 10" (1.778 m)   Wt 201 lb 12.8 oz (91.5 kg)   SpO2 95%   BMI 28.96 kg/m  Wt Readings from Last 3 Encounters:  02/07/19 201 lb 12.8 oz (91.5 kg)  01/16/19 204 lb 6.4 oz (92.7 kg)  12/11/18 205 lb (93 kg)     Lab Results  Component Value Date   WBC 9.1 02/07/2019   HGB 15.1 02/07/2019   HCT 44.0 02/07/2019   PLT 216 02/07/2019   GLUCOSE 234 (H) 02/07/2019   CHOL 139 02/07/2019   TRIG 476 (H) 02/07/2019    HDL 31 (L) 02/07/2019   LDLDIRECT 69.0 11/20/2017   [place]  02/07/2019     Comment:     . LDL cholesterol not calculated. Triglyceride levels greater than 400 mg/dL invalidate calculated LDL results. . Reference range: <100 . Desirable range <100 mg/dL for primary prevention;   <70 mg/dL for patients with CHD or diabetic patients  with > or = 2 CHD risk factors. Marland Kitchen LDL-C is now calculated using the Martin-Hopkins  calculation, which is a validated novel method providing  better accuracy than the Friedewald equation in the  estimation of LDL-C.  Cresenciano Genre et al. Annamaria Helling. 5974;163(84): 2061-2068  (http://education.[facility].com/faq/FAQ164)    ALT 17 02/07/2019   AST 11 02/07/2019   NA 142 02/07/2019   K 4.5 02/07/2019   CL 100 02/07/2019   CREATININE 2.30 (H) 02/07/2019   BUN 43 (H) 02/07/2019   CO2 25 02/07/2019   TSH 4.560 (H) 09/10/2017   PSA 0.26 09/21/2015   INR 1.4 (H) 12/03/2018   HGBA1C 11.2 (H) 02/07/2019   MICROALBUR 1.9 09/21/2015    No results found.   Assessment & Plan:  Plan  I have discontinued Lyda Kalata. [place]'s metFORMIN. I am also having him start on metFORMIN. Additionally, I am having him maintain his nitroGLYCERIN, guaiFENesin, blood glucose meter kit and supplies, Accu-Chek Guide, glucose blood, Accu-Chek FastClix Lancets, digoxin, Eliquis, spironolactone, carvedilol, predniSONE, predniSONE, albuterol, albuterol, mometasone-formoterol, atorvastatin, Polyethyl Glycol-Propyl Glycol, furosemide, Entresto, and doxycycline.  Meds ordered this encounter  Medications  . DISCONTD: doxycycline (VIBRA-TABS) 100 MG tablet    Sig: Take 1 tablet (100 mg total) by mouth 2 (two) times daily.    Dispense:  20 tablet    Refill:  0  . metFORMIN (GLUCOPHAGE) 1000 MG tablet    Sig: Take 1 tablet (1,000 mg total) by mouth 2 (two) times daily with a meal.    Dispense:  180 tablet    Refill:  3  . doxycycline (VIBRA-TABS) 100 MG tablet    Sig:  Take 1 tablet (100  mg total) by mouth 2 (two) times daily.    Dispense:  20 tablet    Refill:  0    Problem List Items Addressed This Visit      Unprioritized   Essential hypertension   Relevant Orders   Comprehensive metabolic panel (Completed)   CBC with Differential/Platelet (Completed)    Other Visit Diagnoses    Open wound of right foot with complication, initial encounter    -  Primary   Relevant Medications   doxycycline (VIBRA-TABS) 100 MG tablet   Other Relevant Orders   CBC with Differential/Platelet (Completed)   Td vaccine greater than or equal to 7yo preservative free IM (Completed)   Uncontrolled type 2 diabetes mellitus with hyperglycemia (HCC)       Relevant Medications   metFORMIN (GLUCOPHAGE) 1000 MG tablet   Other Relevant Orders   Hemoglobin A1c (Completed)   Comprehensive metabolic panel (Completed)   Ambulatory referral to Ophthalmology   POCT glucose (manual entry) (Completed)   Hyperlipidemia associated with type 2 diabetes mellitus ([place])       Relevant Medications   metFORMIN (GLUCOPHAGE) 1000 MG tablet   Other Relevant Orders   Lipid panel (Completed)   Blurry vision, bilateral       Relevant Orders   Ambulatory referral to Ophthalmology      Follow-up: Return in about 4 days (around 02/11/2019), or if symptoms worsen or fail to improve, for f/u foot.  Ann Held, DO

## 2019-02-08 ENCOUNTER — Other Ambulatory Visit: Payer: Self-pay | Admitting: Family Medicine

## 2019-02-08 DIAGNOSIS — R7989 Other specified abnormal findings of blood chemistry: Secondary | ICD-10-CM

## 2019-02-08 DIAGNOSIS — E1165 Type 2 diabetes mellitus with hyperglycemia: Secondary | ICD-10-CM

## 2019-02-08 DIAGNOSIS — E781 Pure hyperglyceridemia: Secondary | ICD-10-CM

## 2019-02-08 LAB — CBC WITH DIFFERENTIAL/PLATELET
Absolute Monocytes: 792 cells/uL (ref 200–950)
Basophils Absolute: 55 cells/uL (ref 0–200)
Basophils Relative: 0.6 %
Eosinophils Absolute: 109 cells/uL (ref 15–500)
Eosinophils Relative: 1.2 %
HCT: 44 % (ref 38.5–50.0)
Hemoglobin: 15.1 g/dL (ref 13.2–17.1)
Lymphs Abs: 1784 cells/uL (ref 850–3900)
MCH: 36.7 pg — ABNORMAL HIGH (ref 27.0–33.0)
MCHC: 34.3 g/dL (ref 32.0–36.0)
MCV: 106.8 fL — ABNORMAL HIGH (ref 80.0–100.0)
MPV: 9.6 fL (ref 7.5–12.5)
Monocytes Relative: 8.7 %
Neutro Abs: 6361 cells/uL (ref 1500–7800)
Neutrophils Relative %: 69.9 %
Platelets: 216 10*3/uL (ref 140–400)
RBC: 4.12 10*6/uL — ABNORMAL LOW (ref 4.20–5.80)
RDW: 13.2 % (ref 11.0–15.0)
Total Lymphocyte: 19.6 %
WBC: 9.1 10*3/uL (ref 3.8–10.8)

## 2019-02-08 LAB — COMPREHENSIVE METABOLIC PANEL
AG Ratio: 1.4 (calc) (ref 1.0–2.5)
ALT: 17 U/L (ref 9–46)
AST: 11 U/L (ref 10–35)
Albumin: 4.2 g/dL (ref 3.6–5.1)
Alkaline phosphatase (APISO): 60 U/L (ref 35–144)
BUN/Creatinine Ratio: 19 (calc) (ref 6–22)
BUN: 43 mg/dL — ABNORMAL HIGH (ref 7–25)
CO2: 25 mmol/L (ref 20–32)
Calcium: 9.6 mg/dL (ref 8.6–10.3)
Chloride: 100 mmol/L (ref 98–110)
Creat: 2.3 mg/dL — ABNORMAL HIGH (ref 0.70–1.33)
Globulin: 3.1 g/dL (calc) (ref 1.9–3.7)
Glucose, Bld: 234 mg/dL — ABNORMAL HIGH (ref 65–99)
Potassium: 4.5 mmol/L (ref 3.5–5.3)
Sodium: 142 mmol/L (ref 135–146)
Total Bilirubin: 0.8 mg/dL (ref 0.2–1.2)
Total Protein: 7.3 g/dL (ref 6.1–8.1)

## 2019-02-08 LAB — HEMOGLOBIN A1C
Hgb A1c MFr Bld: 11.2 % of total Hgb — ABNORMAL HIGH (ref <5.7)
Mean Plasma Glucose: 275 (calc)
eAG (mmol/L): 15.2 (calc)

## 2019-02-08 LAB — LIPID PANEL
Cholesterol: 139 mg/dL (ref <200)
HDL: 31 mg/dL — ABNORMAL LOW (ref >=40)
Non-HDL Cholesterol (Calc): 108 mg/dL (calc) (ref <130)
Total CHOL/HDL Ratio: 4.5 (calc) (ref <5.0)
Triglycerides: 476 mg/dL — ABNORMAL HIGH (ref <150)

## 2019-02-08 MED ORDER — TRESIBA FLEXTOUCH 100 UNIT/ML ~~LOC~~ SOPN: PEN_INJECTOR | SUBCUTANEOUS | 3 refills | Status: DC

## 2019-02-08 MED ORDER — FENOFIBRATE 160 MG PO TABS: 160.0000 mg | ORAL_TABLET | Freq: Every day | ORAL | 2 refills | Status: DC

## 2019-02-08 NOTE — Assessment & Plan Note (Signed)
Pt needs to see nephrology Kidney function workening

## 2019-02-08 NOTE — Assessment & Plan Note (Signed)
Well controlled, no changes to meds. Encouraged heart healthy diet such as the DASH diet and exercise as tolerated.

## 2019-02-08 NOTE — Assessment & Plan Note (Signed)
Encouraged heart healthy diet, increase exercise, avoid trans fats, consider a krill oil cap daily 

## 2019-02-11 ENCOUNTER — Encounter: Payer: Self-pay | Admitting: Family Medicine

## 2019-02-11 ENCOUNTER — Other Ambulatory Visit: Payer: Self-pay | Admitting: Sports Medicine

## 2019-02-11 ENCOUNTER — Ambulatory Visit (INDEPENDENT_AMBULATORY_CARE_PROVIDER_SITE_OTHER): Payer: Medicare HMO

## 2019-02-11 ENCOUNTER — Other Ambulatory Visit: Payer: Self-pay

## 2019-02-11 ENCOUNTER — Ambulatory Visit (INDEPENDENT_AMBULATORY_CARE_PROVIDER_SITE_OTHER): Payer: Medicare HMO | Admitting: Family Medicine

## 2019-02-11 ENCOUNTER — Encounter: Payer: Self-pay | Admitting: Sports Medicine

## 2019-02-11 ENCOUNTER — Ambulatory Visit: Payer: Medicare HMO | Admitting: Sports Medicine

## 2019-02-11 VITALS — Temp 97.5°F

## 2019-02-11 VITALS — BP 136/91 | HR 87 | Temp 97.0°F | Resp 18 | Ht 70.0 in | Wt 200.2 lb

## 2019-02-11 DIAGNOSIS — E1165 Type 2 diabetes mellitus with hyperglycemia: Secondary | ICD-10-CM

## 2019-02-11 DIAGNOSIS — L02619 Cutaneous abscess of unspecified foot: Secondary | ICD-10-CM

## 2019-02-11 DIAGNOSIS — L97511 Non-pressure chronic ulcer of other part of right foot limited to breakdown of skin: Secondary | ICD-10-CM

## 2019-02-11 DIAGNOSIS — L03119 Cellulitis of unspecified part of limb: Secondary | ICD-10-CM

## 2019-02-11 DIAGNOSIS — S91301A Unspecified open wound, right foot, initial encounter: Secondary | ICD-10-CM

## 2019-02-11 DIAGNOSIS — M79671 Pain in right foot: Secondary | ICD-10-CM

## 2019-02-11 MED ORDER — SILVER SULFADIAZINE 1 % EX CREA: 1.0000 "application " | TOPICAL_CREAM | Freq: Every day | CUTANEOUS | 0 refills | Status: DC

## 2019-02-11 MED FILL — FUROSEMIDE 20 MG TABS: 20 | 15 days supply | Qty: 30 | Fill #2

## 2019-02-11 MED FILL — DIGOXIN 0.125 MG TABLET: 125 | 30 days supply | Qty: 15 | Fill #2

## 2019-02-11 MED FILL — SSD 1% CREAM: 1 | 30 days supply | Qty: 50 | Fill #0

## 2019-02-11 NOTE — Progress Notes (Signed)
Patient ID: Vincent Chandler, male    DOB: [birth year]  Age: 58 y.o. MRN: [id number]    Subjective:  Subjective  HPI Vincent Chandler presents for f/u of her foot   Review of Systems  Constitutional: Negative for appetite change, diaphoresis, fatigue and unexpected weight change.  Eyes: Negative for pain, redness and visual disturbance.  Respiratory: Negative for cough, chest tightness, shortness of breath and wheezing.   Cardiovascular: Negative for chest pain, palpitations and leg swelling.  Gastrointestinal: Positive for diarrhea.  Endocrine: Negative for cold intolerance, heat intolerance, polydipsia, polyphagia and polyuria.  Genitourinary: Negative for difficulty urinating, dysuria and frequency.  Neurological: Negative for dizziness, light-headedness, numbness and headaches.    History Past Medical History:  Diagnosis Date  . AICD (automatic cardioverter/defibrillator) present   . Anginal pain ([place])   . Anxiety   . Asthma   . Atrial fibrillation-postoperative 11/28/2012  . Cardiomyopathy    alcohol use related  . CHF (congestive heart failure) ([place])   . Chronic back pain   . Coronary artery disease    drug eluting stent RCA 2005-EF 30%- s/p CABG x 4; 2/4 patent grafts with SVG-PLOM and SVG-RCA system totally occluded. There are collaterals from the LAD system to the RCA and the RCA is totally occluded proximally. There are no collaterals to the LCx territory. He then underwent successful PCI of the mid left circumflex artery with overlapping Synergy drug-eluting stents  . Depression    pt denies  . Diabetes mellitus type II dx'd in the 1990's  . GERD (gastroesophageal reflux disease)    yrs ago  . H/O hiatal hernia   . Hemorrhoids   . History of hypogonadism   . History of kidney stones   . Hyperlipidemia   . Hypertension   . OSA on CPAP    "mask is broken; working on getting a new one" (01/15/2015; 10/03/2017)  . Pneumonia    "3-4 times" (10/03/2017)  . Urinary  incontinence     He has a past surgical history that includes Tonsillectomy (~ 1970); Coronary angioplasty with stent (~ 2003); Refractive surgery (Bilateral, 1990's); Coronary artery bypass graft (N/A, 08/15/2012); left heart catheterization with coronary angiogram (N/A, 08/08/2012); left heart catheterization with coronary/graft angiogram (N/A, 07/21/2014); percutaneous coronary stent intervention (pci-s) (07/21/2014); Cardiac defibrillator placement (01/15/2015); Cardiac catheterization (N/A, 01/15/2015); TEE without cardioversion (N/A, 08/10/2017); Cardioversion (N/A, 08/10/2017); A-FLUTTER ABLATION (N/A, 09/12/2017); RIGHT HEART CATH (N/A, 10/03/2017); Multiple extractions with alveoloplasty (N/A, 10/04/2017); Video bronchoscopy (Bilateral, 11/28/2017); and RIGHT HEART CATH (N/A, 12/11/2018).   His family history includes Diabetes in his father and mother; Hyperlipidemia in his father; Hypertension in his father and mother.He reports that he has never smoked. His smokeless tobacco use includes chew. He reports current alcohol use of about 3.0 standard drinks of alcohol per week. He reports that he does not use drugs.  Current Outpatient Medications on File Prior to Visit  Medication Sig Dispense Refill  . ACCU-CHEK FASTCLIX LANCETS MISC Use as directed once a day.  Dx code: E11.9 100 each 1  . albuterol (ACCUNEB) 0.63 MG/3ML nebulizer solution Take 3 mLs (0.63 mg total) by nebulization every 6 (six) hours as needed for wheezing. 75 mL 12  . albuterol (PROAIR HFA) 108 (90 Base) MCG/ACT inhaler Inhale 1-2 puffs into the lungs every 6 (six) hours as needed for wheezing or shortness of breath. (Patient taking differently: Inhale 2 puffs into the lungs every 6 (six) hours as needed for wheezing or shortness of breath. ) 1  Inhaler 3  . atorvastatin (LIPITOR) 40 MG tablet TAKE 1 TABLET (40 MG TOTAL) BY MOUTH AT BEDTIME. 90 tablet 3  . blood glucose meter kit and supplies Dispense based on patient and insurance preference.  Use as directed once a day. Dx Code E11.9. 1 each 0  . Blood Glucose Monitoring Suppl (ACCU-CHEK GUIDE) w/Device KIT 1 each by Does not apply route daily. DX Code: E11.9 1 kit 0  . carvedilol (COREG) 12.5 MG tablet Take 2 tablets (25 mg total) by mouth 2 (two) times daily with a meal. (Patient taking differently: Take 12.5 mg by mouth 2 (two) times daily with a meal. ) 180 tablet 3  . digoxin (LANOXIN) 0.125 MG tablet TAKE 1/2 TABLET BY MOUTH DAILY. (Patient taking differently: Take 0.0625 mg by mouth daily. ) 15 tablet 5  . doxycycline (VIBRA-TABS) 100 MG tablet Take 1 tablet (100 mg total) by mouth 2 (two) times daily. 20 tablet 0  . ELIQUIS 5 MG TABS tablet TAKE 1 TABLET BY MOUTH 2 TIMES DAILY. (Patient taking differently: Take 5 mg by mouth 2 (two) times daily. ) 60 tablet 5  . ENTRESTO 49-51 MG TAKE 1 TABLET BY MOUTH 2 TIMES DAILY. 60 tablet 2  . fenofibrate 160 MG tablet Take 1 tablet (160 mg total) by mouth daily. 30 tablet 2  . furosemide (LASIX) 20 MG tablet Take 2 tablets (40 mg total) by mouth daily. 30 tablet 11  . glucose blood (ACCU-CHEK GUIDE) test strip Use as directed once a day.  Dx code: E11.9 100 each 1  . guaiFENesin (MUCINEX) 600 MG 12 hr tablet Take 1 tablet (600 mg total) by mouth 2 (two) times daily as needed for to loosen phlegm.    . insulin degludec (TRESIBA FLEXTOUCH) 100 UNIT/ML SOPN FlexTouch Pen 10 u Sq daily 3 mL 3  . mometasone-formoterol (DULERA) 200-5 MCG/ACT AERO Inhale 2 puffs into the lungs 2 (two) times a day. 2 Inhaler 0  . nitroGLYCERIN (NITROSTAT) 0.4 MG SL tablet Place 1 tablet (0.4 mg total) under the tongue every 5 (five) minutes as needed for chest pain. 25 tablet 3  . Polyethyl Glycol-Propyl Glycol (SYSTANE) 0.4-0.3 % SOLN Place 1 drop into both eyes daily.    . predniSONE (DELTASONE) 10 MG tablet Take 1 tablet (10 mg total) by mouth daily. 90 tablet 3  . predniSONE (DELTASONE) 10 MG tablet Take 4 tabs for 2 days, then 3 tabs for 2 days, then 2 tabs for  2 days, then 1 tab for 2 days, then stop (Patient taking differently: Take 10-40 mg by mouth See admin instructions. Take 40 mg by mouth daily for 2 days, then 30 mg by mouth daily for 2 days, then 20 mg by mouth daily for 2 days, then 10 mg by mouth daily for 2 days, then stop) 20 tablet 0  . spironolactone (ALDACTONE) 25 MG tablet Take 1 tablet (25 mg total) by mouth daily. 90 tablet 3   No current facility-administered medications on file prior to visit.      Objective:  Objective  Physical Exam Vitals signs and nursing note reviewed.  Constitutional:      General: He is sleeping.     Appearance: He is well-developed.  HENT:     Head: Normocephalic and atraumatic.  Eyes:     Pupils: Pupils are equal, round, and reactive to light.  Neck:     Musculoskeletal: Normal range of motion and neck supple.     Thyroid: No thyromegaly.  Cardiovascular:     Rate and Rhythm: Normal rate and regular rhythm.     Heart sounds: No murmur.  Pulmonary:     Effort: Pulmonary effort is normal. No respiratory distress.     Breath sounds: Normal breath sounds. No wheezing or rales.  Chest:     Chest wall: No tenderness.  Musculoskeletal:        General: No tenderness.       Feet:  Skin:    General: Skin is warm and dry.     Findings: Lesion present.  Neurological:     Mental Status: He is oriented to person, place, and time.  Psychiatric:        Behavior: Behavior normal.        Thought Content: Thought content normal.        Judgment: Judgment normal.    BP (!) 136/91 (BP Location: Left Arm, Patient Position: Sitting, Cuff Size: Normal)   Pulse 87   Temp (!) 97 F (36.1 C) (Temporal)   Resp 18   Ht _0  (1.778 m)   Wt 200 lb 3.2 oz (90.8 kg)   SpO2 94%   BMI 28.73 kg/m  Wt Readings from Last 3 Encounters:  02/11/19 200 lb 3.2 oz (90.8 kg)  02/07/19 201 lb 12.8 oz (91.5 kg)  01/16/19 204 lb 6.4 oz (92.7 kg)     Lab Results  Component Value Date   WBC 9.1 02/07/2019   HGB  15.1 02/07/2019   HCT 44.0 02/07/2019   PLT 216 02/07/2019   GLUCOSE 234 (H) 02/07/2019   CHOL 139 02/07/2019   TRIG 476 (H) 02/07/2019   HDL 31 (L) 02/07/2019   LDLDIRECT 69.0 11/20/2017   [place]  02/07/2019     Comment:     . LDL cholesterol not calculated. Triglyceride levels greater than 400 mg/dL invalidate calculated LDL results. . Reference range: <100 . Desirable range <100 mg/dL for primary prevention;   <70 mg/dL for patients with CHD or diabetic patients  with > or = 2 CHD risk factors. Marland Kitchen LDL-C is now calculated using the Martin-Hopkins  calculation, which is a validated novel method providing  better accuracy than the Friedewald equation in the  estimation of LDL-C.  Cresenciano Genre et al. Annamaria Helling. 8416;606(30): 2061-2068  (http://education.[facility].com/faq/FAQ164)    ALT 17 02/07/2019   AST 11 02/07/2019   NA 142 02/07/2019   K 4.5 02/07/2019   CL 100 02/07/2019   CREATININE 2.30 (H) 02/07/2019   BUN 43 (H) 02/07/2019   CO2 25 02/07/2019   TSH 4.560 (H) 09/10/2017   PSA 0.26 09/21/2015   INR 1.4 (H) 12/03/2018   HGBA1C 11.2 (H) 02/07/2019   MICROALBUR 1.9 09/21/2015    No results found.   Assessment & Plan:  Plan  I have discontinued Lyda Kalata. [place]'s metFORMIN. I am also having him maintain his nitroGLYCERIN, guaiFENesin, blood glucose meter kit and supplies, Accu-Chek Guide, glucose blood, Accu-Chek FastClix Lancets, digoxin, Eliquis, spironolactone, carvedilol, predniSONE, predniSONE, albuterol, albuterol, mometasone-formoterol, atorvastatin, Polyethyl Glycol-Propyl Glycol, furosemide, Entresto, doxycycline, fenofibrate, and Tresiba FlexTouch.  No orders of the defined types were placed in this encounter.   Problem List Items Addressed This Visit      Unprioritized   Uncontrolled type 2 diabetes mellitus with hyperglycemia ([place])    Pt to start tresiba 10 u daily Stop metformin due to diarrhea Referral to endo pending        Relevant Orders    Ambulatory referral to Endocrinology  Other Visit Diagnoses    Wound of right foot    -  Primary   Relevant Orders   Ambulatory referral to Podiatry      Follow-up: Return in about 3 months (around 05/14/2019), or if symptoms worsen or fail to improve.  Ann Held, DO

## 2019-02-11 NOTE — Patient Instructions (Signed)
Carbohydrate Counting for Diabetes Mellitus, Adult  Carbohydrate counting is a method of keeping track of how many carbohydrates you eat. Eating carbohydrates naturally increases the amount of sugar (glucose) in the blood. Counting how many carbohydrates you eat helps keep your blood glucose within normal limits, which helps you manage your diabetes (diabetes mellitus). It is important to know how many carbohydrates you can safely have in each meal. This is different for every person. A diet and nutrition specialist (registered dietitian) can help you make a meal plan and calculate how many carbohydrates you should have at each meal and snack. Carbohydrates are found in the following foods:  Grains, such as breads and cereals.  Dried beans and soy products.  Starchy vegetables, such as potatoes, peas, and corn.  Fruit and fruit juices.  Milk and yogurt.  Sweets and snack foods, such as cake, cookies, candy, chips, and soft drinks. How do I count carbohydrates? There are two ways to count carbohydrates in food. You can use either of the methods or a combination of both. Reading "Nutrition Facts" on packaged food The "Nutrition Facts" list is included on the labels of almost all packaged foods and beverages in the [place] It includes:  The serving size.  Information about nutrients in each serving, including the grams (g) of carbohydrate per serving. To use the "Nutrition Facts":  Decide how many servings you will have.  Multiply the number of servings by the number of carbohydrates per serving.  The resulting number is the total amount of carbohydrates that you will be having. Learning standard serving sizes of other foods When you eat carbohydrate foods that are not packaged or do not include "Nutrition Facts" on the label, you need to measure the servings in order to count the amount of carbohydrates:  Measure the foods that you will eat with a food scale or measuring cup, if needed.   Decide how many standard-size servings you will eat.  Multiply the number of servings by 15. Most carbohydrate-rich foods have about 15 g of carbohydrates per serving. ? For example, if you eat 8 oz (170 g) of strawberries, you will have eaten 2 servings and 30 g of carbohydrates (2 servings x 15 g = 30 g).  For foods that have more than one food mixed, such as soups and casseroles, you must count the carbohydrates in each food that is included. The following list contains standard serving sizes of common carbohydrate-rich foods. Each of these servings has about 15 g of carbohydrates:   hamburger bun or  English muffin.   oz (15 mL) syrup.   oz (14 g) jelly.  1 slice of bread.  1 six-inch tortilla.  3 oz (85 g) cooked rice or pasta.  4 oz (113 g) cooked dried beans.  4 oz (113 g) starchy vegetable, such as peas, corn, or potatoes.  4 oz (113 g) hot cereal.  4 oz (113 g) mashed potatoes or  of a large baked potato.  4 oz (113 g) canned or frozen fruit.  4 oz (120 mL) fruit juice.  4-6 crackers.  6 chicken nuggets.  6 oz (170 g) unsweetened dry cereal.  6 oz (170 g) plain fat-free yogurt or yogurt sweetened with artificial sweeteners.  8 oz (240 mL) milk.  8 oz (170 g) fresh fruit or one small piece of fruit.  24 oz (680 g) popped popcorn. Example of carbohydrate counting Sample meal  3 oz (85 g) chicken breast.  6 oz (170 g)   brown rice.  4 oz (113 g) corn.  8 oz (240 mL) milk.  8 oz (170 g) strawberries with sugar-free whipped topping. Carbohydrate calculation 1. Identify the foods that contain carbohydrates: ? Rice. ? Corn. ? Milk. ? Strawberries. 2. Calculate how many servings you have of each food: ? 2 servings rice. ? 1 serving corn. ? 1 serving milk. ? 1 serving strawberries. 3. Multiply each number of servings by 15 g: ? 2 servings rice x 15 g = 30 g. ? 1 serving corn x 15 g = 15 g. ? 1 serving milk x 15 g = 15 g. ? 1 serving  strawberries x 15 g = 15 g. 4. Add together all of the amounts to find the total grams of carbohydrates eaten: ? 30 g + 15 g + 15 g + 15 g = 75 g of carbohydrates total. Summary  Carbohydrate counting is a method of keeping track of how many carbohydrates you eat.  Eating carbohydrates naturally increases the amount of sugar (glucose) in the blood.  Counting how many carbohydrates you eat helps keep your blood glucose within normal limits, which helps you manage your diabetes.  A diet and nutrition specialist (registered dietitian) can help you make a meal plan and calculate how many carbohydrates you should have at each meal and snack. This information is not intended to replace advice given to you by your health care provider. Make sure you discuss any questions you have with your health care provider. Document Released: 06/19/2005 Document Revised: 01/11/2017 Document Reviewed: 12/01/2015 [employer] Patient Education  2020 [employer]  

## 2019-02-11 NOTE — Assessment & Plan Note (Signed)
Pt to start tresiba 10 u daily Stop metformin due to diarrhea Referral to endo pending

## 2019-02-11 NOTE — Progress Notes (Signed)
Subjective: Vincent Chandler is a 58 y.o. male patient seen in office for evaluation of ulceration of the right plantar forefoot.  Patient reports that it started out as a callus that he used a stone on and then it started to drain has saw his primary care and reports that this is been going on for the last 2 weeks states that he is currently on doxycycline as prescribed by his primary care doctor and his primary doctor became concerned because the wound is still there and has not healed.  Patient reports that his blood sugar 2 days ago was 189 and admits some numbness tingling burning sensation to feet denies nausea vomiting fever chills or any other constitutional symptoms at this time.  Review of Systems  Skin:       Wound  All other systems reviewed and are negative.   Patient Active Problem List   Diagnosis Date Noted  . Uncontrolled type 2 diabetes mellitus with hyperglycemia ([place]) 02/11/2019  . ILD (interstitial lung disease) ([place])   . Chronic periodontitis 08/30/2017  . Atrial flutter ([place]) 08/08/2017  . Dyspnea 08/07/2017  . Hyperglycemia 08/07/2017  . CKD (chronic kidney disease) 2-3 07/19/2014  . NSTEMI (non-ST elevated myocardial infarction) ([place]) 07/18/2014  . OSA (obstructive sleep apnea) 07/18/2014  . Tachycardia   . V tach ([place]) 07/17/2014  . Hypoxemia 07/17/2014  . CHF (congestive heart failure) ([place]) 06/04/2012  . Sinus tachycardia 12/29/2010  . Coronary artery disease prior RCA stent with new inferior Q waves 10/18/2010  . Ischemic and nonischemic cardiomyopathy   10/18/2010  . Uncontrolled type 2 diabetes mellitus without complication, without long-term current use of insulin ([place]) 03/10/2010  . Hyperlipidemia 03/10/2010  . Essential hypertension 03/10/2010   Current Outpatient Medications on File Prior to Visit  Medication Sig Dispense Refill  . ACCU-CHEK FASTCLIX LANCETS MISC Use as directed once a day.  Dx code: E11.9 100 each 1  . albuterol (ACCUNEB) 0.63  MG/3ML nebulizer solution Take 3 mLs (0.63 mg total) by nebulization every 6 (six) hours as needed for wheezing. 75 mL 12  . albuterol (PROAIR HFA) 108 (90 Base) MCG/ACT inhaler Inhale 1-2 puffs into the lungs every 6 (six) hours as needed for wheezing or shortness of breath. (Patient taking differently: Inhale 2 puffs into the lungs every 6 (six) hours as needed for wheezing or shortness of breath. ) 1 Inhaler 3  . atorvastatin (LIPITOR) 40 MG tablet TAKE 1 TABLET (40 MG TOTAL) BY MOUTH AT BEDTIME. 90 tablet 3  . blood glucose meter kit and supplies Dispense based on patient and insurance preference. Use as directed once a day. Dx Code E11.9. 1 each 0  . Blood Glucose Monitoring Suppl (ACCU-CHEK GUIDE) w/Device KIT 1 each by Does not apply route daily. DX Code: E11.9 1 kit 0  . carvedilol (COREG) 12.5 MG tablet Take 2 tablets (25 mg total) by mouth 2 (two) times daily with a meal. (Patient taking differently: Take 12.5 mg by mouth 2 (two) times daily with a meal. ) 180 tablet 3  . digoxin (LANOXIN) 0.125 MG tablet TAKE 1/2 TABLET BY MOUTH DAILY. (Patient taking differently: Take 0.0625 mg by mouth daily. ) 15 tablet 5  . doxycycline (VIBRA-TABS) 100 MG tablet Take 1 tablet (100 mg total) by mouth 2 (two) times daily. 20 tablet 0  . ELIQUIS 5 MG TABS tablet TAKE 1 TABLET BY MOUTH 2 TIMES DAILY. (Patient taking differently: Take 5 mg by mouth 2 (two) times daily. ) 60  tablet 5  . ENTRESTO 49-51 MG TAKE 1 TABLET BY MOUTH 2 TIMES DAILY. 60 tablet 2  . fenofibrate 160 MG tablet Take 1 tablet (160 mg total) by mouth daily. 30 tablet 2  . furosemide (LASIX) 20 MG tablet Take 2 tablets (40 mg total) by mouth daily. 30 tablet 11  . glucose blood (ACCU-CHEK GUIDE) test strip Use as directed once a day.  Dx code: E11.9 100 each 1  . guaiFENesin (MUCINEX) 600 MG 12 hr tablet Take 1 tablet (600 mg total) by mouth 2 (two) times daily as needed for to loosen phlegm.    . insulin degludec (TRESIBA FLEXTOUCH) 100  UNIT/ML SOPN FlexTouch Pen 10 u Sq daily 3 mL 3  . mometasone-formoterol (DULERA) 200-5 MCG/ACT AERO Inhale 2 puffs into the lungs 2 (two) times a day. 2 Inhaler 0  . nitroGLYCERIN (NITROSTAT) 0.4 MG SL tablet Place 1 tablet (0.4 mg total) under the tongue every 5 (five) minutes as needed for chest pain. 25 tablet 3  . Polyethyl Glycol-Propyl Glycol (SYSTANE) 0.4-0.3 % SOLN Place 1 drop into both eyes daily.    . predniSONE (DELTASONE) 10 MG tablet Take 1 tablet (10 mg total) by mouth daily. 90 tablet 3  . predniSONE (DELTASONE) 10 MG tablet Take 4 tabs for 2 days, then 3 tabs for 2 days, then 2 tabs for 2 days, then 1 tab for 2 days, then stop (Patient taking differently: Take 10-40 mg by mouth See admin instructions. Take 40 mg by mouth daily for 2 days, then 30 mg by mouth daily for 2 days, then 20 mg by mouth daily for 2 days, then 10 mg by mouth daily for 2 days, then stop) 20 tablet 0  . spironolactone (ALDACTONE) 25 MG tablet Take 1 tablet (25 mg total) by mouth daily. 90 tablet 3   No current facility-administered medications on file prior to visit.    No Known Allergies  Recent Results (from the past 2160 hour(s))  Lipid Profile     Status: Abnormal   Collection Time: 11/14/18  2:08 PM  Result Value Ref Range   Cholesterol 130 0 - 200 mg/dL   Triglycerides 396 (H) <150 mg/dL   HDL 35 (L) >40 mg/dL   Total CHOL/HDL Ratio 3.7 RATIO   VLDL 79 (H) 0 - 40 mg/dL   LDL Cholesterol 16 0 - 99 mg/dL    Comment:        Total Cholesterol/HDL:CHD Risk Coronary Heart Disease Risk Table                     Men   Women  1/2 Average Risk   3.4   3.3  Average Risk       5.0   4.4  2 X Average Risk   9.6   7.1  3 X Average Risk  23.4   11.0        Use the calculated Patient Ratio above and the CHD Risk Table to determine the patient's CHD Risk.        ATP III CLASSIFICATION (LDL):  <100     mg/dL   Optimal  100-129  mg/dL   Near or Above                    Optimal  130-159  mg/dL    Borderline  160-189  mg/dL   High  >190     mg/dL   Very High Performed at [facility]  Hospital Lab, [place] [address], [place], [place] [address]   Digoxin level     Status: Abnormal   Collection Time: 11/14/18  2:08 PM  Result Value Ref Range   Digoxin Level 0.6 (L) 0.8 - 2.0 ng/mL    Comment: Performed at [facility] [address], [place], [place] [address]  Basic metabolic panel     Status: Abnormal   Collection Time: 11/14/18  2:08 PM  Result Value Ref Range   Sodium 138 135 - 145 mmol/L   Potassium 4.2 3.5 - 5.1 mmol/L   Chloride 105 98 - 111 mmol/L   CO2 24 22 - 32 mmol/L   Glucose, Bld 219 (H) 70 - 99 mg/dL   BUN 31 (H) 6 - 20 mg/dL   Creatinine, Ser 1.81 (H) 0.61 - 1.24 mg/dL   Calcium 8.7 (L) 8.9 - 10.3 mg/dL   GFR calc non Af Amer 41 (L) >60 mL/min   GFR calc Af Amer 47 (L) >60 mL/min   Anion gap 9 5 - 15    Comment: Performed at [facility] [address], [place], [place] [address]  CBC     Status: Abnormal   Collection Time: 11/14/18  2:08 PM  Result Value Ref Range   WBC 7.0 4.0 - 10.5 K/uL   RBC 3.74 (L) 4.22 - 5.81 MIL/uL   Hemoglobin 13.8 13.0 - 17.0 g/dL   HCT 42.9 39.0 - 52.0 %   MCV 114.7 (H) 80.0 - 100.0 fL   MCH 36.9 (H) 26.0 - 34.0 pg   MCHC 32.2 30.0 - 36.0 g/dL   RDW 14.6 11.5 - 15.5 %   Platelets 180 150 - 400 K/uL   nRBC 0.6 (H) 0.0 - 0.2 %    Comment: Performed at [facility] [address] [place], [place] [address]  INR/PT     Status: Abnormal   Collection Time: 12/03/18 11:15 AM  Result Value Ref Range   Prothrombin Time 17.3 (H) 11.4 - 15.2 seconds   INR 1.4 (H) 0.8 - 1.2    Comment: (NOTE) INR goal varies based on device and disease states. Performed at [facility], [place] [address], [place], [place] [address]   B Nat Peptide     Status: Abnormal   Collection Time: 12/03/18 11:37 AM  Result Value Ref Range   B Natriuretic Peptide 288.2 (H) 0.0 - 100.0 pg/mL    Comment: Performed at [facility] [address], [place], [place] [address]  Basic metabolic panel     Status: Abnormal   Collection Time: 12/03/18 11:37 AM  Result Value Ref Range   Sodium 140 135 - 145 mmol/L   Potassium 4.8 3.5 - 5.1 mmol/L   Chloride 106 98 - 111 mmol/L   CO2 23 22 - 32 mmol/L   Glucose, Bld 239 (H) 70 - 99 mg/dL   BUN 32 (H) 6 - 20 mg/dL   Creatinine, Ser 1.79 (H) 0.61 - 1.24 mg/dL   Calcium 9.2 8.9 - 10.3 mg/dL   GFR calc non Af Amer 41 (L) >60 mL/min   GFR calc Af Amer 48 (L) >60 mL/min   Anion gap 11 5 - 15    Comment: Performed at [facility] [address] [place], [place] [address]  Novel Coronavirus, NAA (hospital order; send-out to ref lab)     Status: None   Collection Time: 12/06/18 11:41 AM   Specimen:  Nasopharyngeal Swab; Respiratory  Result Value Ref Range   SARS-CoV-2, NAA NOT DETECTED NOT DETECTED    Comment: (NOTE) This test was developed and its performance characteristics determined by [employer]. This test has not been FDA cleared or approved. This test has been authorized by FDA under an Emergency Use Authorization (EUA). This test is only authorized for the duration of time the declaration that circumstances exist justifying the authorization of the emergency use of in vitro diagnostic tests for detection of SARS-CoV-2 virus and/or diagnosis of COVID-19 infection under section 564(b)(1) of the Act, 21 U.S.C. 650PTW-6(F)(6), unless the authorization is terminated or revoked sooner. When diagnostic testing is negative, the possibility of a false negative result should be considered in the context of a patient's recent exposures and the presence of clinical signs and symptoms consistent with COVID-19. An individual without symptoms of COVID-19 and who is not shedding SARS-CoV-2 virus would expect to have a negative (not detected) result in this assay. Performed  At: [facility] [place], [place] [id number] Rush Farmer MD  FV:[id number]    Coronavirus Source NASOPHARYNGEAL     Comment: Performed at [facility], [place] [address], [place], [place] [address]  Glucose, capillary     Status: Abnormal   Collection Time: 12/11/18  8:30 AM  Result Value Ref Range   Glucose-Capillary 285 (H) 70 - 99 mg/dL  POCT I-Stat EG7     Status: Abnormal   Collection Time: 12/11/18 10:54 AM  Result Value Ref Range   pH, Ven 7.334 7.250 - 7.430   pCO2, Ven 55.1 44.0 - 60.0 mmHg   pO2, Ven 40.0 32.0 - 45.0 mmHg   Bicarbonate 29.3 (H) 20.0 - 28.0 mmol/L   TCO2 31 22 - 32 mmol/L   O2 Saturation 69.0 %   Acid-Base Excess 2.0 0.0 - 2.0 mmol/L   Sodium 139 135 - 145 mmol/L   Potassium 4.1 3.5 - 5.1 mmol/L   Calcium, Ion 1.29 1.15 - 1.40 mmol/L   HCT 44.0 39.0 - 52.0 %   Hemoglobin 15.0 13.0 - 17.0 g/dL   Patient temperature HIDE    Sample type VENOUS   POCT I-Stat EG7     Status: Abnormal   Collection Time: 12/11/18 10:54 AM  Result Value Ref Range   pH, Ven 7.330 7.250 - 7.430   pCO2, Ven 56.4 44.0 - 60.0 mmHg   pO2, Ven 40.0 32.0 - 45.0 mmHg   Bicarbonate 29.7 (H) 20.0 - 28.0 mmol/L   TCO2 31 22 - 32 mmol/L   O2 Saturation 69.0 %   Acid-Base Excess 2.0 0.0 - 2.0 mmol/L   Sodium 139 135 - 145 mmol/L   Potassium 3.9 3.5 - 5.1 mmol/L   Calcium, Ion 1.27 1.15 - 1.40 mmol/L   HCT 45.0 39.0 - 52.0 %   Hemoglobin 15.3 13.0 - 17.0 g/dL   Patient temperature HIDE    Sample type VENOUS   I-STAT creatinine     Status: Abnormal   Collection Time: 12/11/18 11:09 AM  Result Value Ref Range   Creatinine, Ser 2.00 (H) 0.61 - 1.24 mg/dL  SARS Coronavirus 2 (Performed in [facility] hospital lab)     Status: None   Collection Time: 01/13/19  1:05 PM   Specimen: Nasal Swab  Result Value Ref Range   SARS Coronavirus 2 NEGATIVE NEGATIVE    Comment: (NOTE) SARS-CoV-2 target nucleic acids are NOT DETECTED. The SARS-CoV-2 RNA is generally detectable in upper and lower  respiratory specimens during the acute phase of infection.  Negative results do not preclude SARS-CoV-2 infection, do not rule out co-infections with other pathogens, and should not be used as the sole basis for treatment or other patient management decisions. Negative results must be combined with clinical observations, patient history, and epidemiological information. The expected result is Negative. Fact Sheet for Patients: SugarRoll.be Fact Sheet for Healthcare Providers: https://www.woods-mathews.com/ This test is not yet approved or cleared by the [place] FDA and  has been authorized for detection and/or diagnosis of SARS-CoV-2 by FDA under an Emergency Use Authorization (EUA). This EUA will remain  in effect (meaning this test can be used) for the duration of the COVID-19 declaration under Section 56 4(b)(1) of the Act, 21 U.S.C. section 360bbb-3(b)(1), unless the authorization is terminated or revoked sooner. Performed at [name], [place] [address], [place], [place] [address]   Pulmonary function test     Status: None (Preliminary result)   Collection Time: 01/16/19 11:59 AM  Result Value Ref Range   FVC-Pre 2.58 L   FVC-%Pred-Pre 52 %   FVC-Post 2.59 L   FVC-%Pred-Post 53 %   FVC-%Change-Post 0 %   FEV1-Pre 2.16 L   FEV1-%Pred-Pre 58 %   FEV1-Post 2.16 L   FEV1-%Pred-Post 58 %   FEV1-%Change-Post 0 %   FEV6-Pre 2.58 L   FEV6-%Pred-Pre 55 %   FEV6-Post 2.59 L   FEV6-%Pred-Post 55 %   FEV6-%Change-Post 0 %   Pre FEV1/FVC ratio 84 %   FEV1FVC-%Pred-Pre 110 %   Post FEV1/FVC ratio 83 %   FEV1FVC-%Change-Post 0 %   Pre FEV6/FVC Ratio 100 %   FEV6FVC-%Pred-Pre 104 %   Post FEV6/FVC ratio 100 %   FEV6FVC-%Pred-Post 104 %   FEF 25-75 Pre 2.51 L/sec   FEF2575-%Pred-Pre 80 %   FEF 25-75 Post 2.40 L/sec   FEF2575-%Pred-Post 76 %   FEF2575-%Change-Post -4 %   RV 0.90 L   RV % pred 41 %   TLC 4.00 L   TLC % pred 57 %   DLCO unc 11.18 ml/min/mmHg   DLCO unc % pred 39 %    DL/VA 2.62 ml/min/mmHg/L   DL/VA % pred 61 %  Hemoglobin A1c     Status: Abnormal   Collection Time: 02/07/19  4:06 PM  Result Value Ref Range   Hgb A1c MFr Bld 11.2 (H) <5.7 % of total Hgb    Comment: For someone without known diabetes, a hemoglobin A1c value of 6.5% or greater indicates that they may have  diabetes and this should be confirmed with a follow-up  test. . For someone with known diabetes, a value <7% indicates  that their diabetes is well controlled and a value  greater than or equal to 7% indicates suboptimal  control. A1c targets should be individualized based on  duration of diabetes, age, comorbid conditions, and  other considerations. . Currently, no consensus exists regarding use of hemoglobin A1c for diagnosis of diabetes for children. .    Mean Plasma Glucose 275 (calc)   eAG (mmol/L) 15.2 (calc)  Comprehensive metabolic panel     Status: Abnormal   Collection Time: 02/07/19  4:06 PM  Result Value Ref Range   Glucose, Bld 234 (H) 65 - 99 mg/dL    Comment: .            Fasting reference interval . For someone without known diabetes, a glucose value >125 mg/dL indicates that they may have diabetes and  this should be confirmed with a follow-up test. .    BUN 43 (H) 7 - 25 mg/dL   Creat 2.30 (H) 0.70 - 1.33 mg/dL    Comment: For patients >57 years of age, the reference limit for Creatinine is approximately 13% higher for people identified as African-American. .    BUN/Creatinine Ratio 19 6 - 22 (calc)   Sodium 142 135 - 146 mmol/L   Potassium 4.5 3.5 - 5.3 mmol/L   Chloride 100 98 - 110 mmol/L   CO2 25 20 - 32 mmol/L   Calcium 9.6 8.6 - 10.3 mg/dL   Total Protein 7.3 6.1 - 8.1 g/dL   Albumin 4.2 3.6 - 5.1 g/dL   Globulin 3.1 1.9 - 3.7 g/dL (calc)   AG Ratio 1.4 1.0 - 2.5 (calc)   Total Bilirubin 0.8 0.2 - 1.2 mg/dL   Alkaline phosphatase (APISO) 60 35 - 144 U/L   AST 11 10 - 35 U/L   ALT 17 9 - 46 U/L  CBC with Differential/Platelet      Status: Abnormal   Collection Time: 02/07/19  4:06 PM  Result Value Ref Range   WBC 9.1 3.8 - 10.8 Thousand/uL   RBC 4.12 (L) 4.20 - 5.80 Million/uL   Hemoglobin 15.1 13.2 - 17.1 g/dL   HCT 44.0 38.5 - 50.0 %   MCV 106.8 (H) 80.0 - 100.0 fL   MCH 36.7 (H) 27.0 - 33.0 pg   MCHC 34.3 32.0 - 36.0 g/dL   RDW 13.2 11.0 - 15.0 %   Platelets 216 140 - 400 Thousand/uL   MPV 9.6 7.5 - 12.5 fL   Neutro Abs 6,361 1,500 - 7,800 cells/uL   Lymphs Abs 1,784 850 - 3,900 cells/uL   Absolute Monocytes 792 200 - 950 cells/uL   Eosinophils Absolute 109 15 - 500 cells/uL   Basophils Absolute 55 0 - 200 cells/uL   Neutrophils Relative % 69.9 %   Total Lymphocyte 19.6 %   Monocytes Relative 8.7 %   Eosinophils Relative 1.2 %   Basophils Relative 0.6 %  Lipid panel     Status: Abnormal   Collection Time: 02/07/19  4:06 PM  Result Value Ref Range   Cholesterol 139 <200 mg/dL   HDL 31 (L) > OR = 40 mg/dL   Triglycerides 476 (H) <150 mg/dL    Comment: . If a non-fasting specimen was collected, consider repeat triglyceride testing on a fasting specimen if clinically indicated.  Yates Decamp et al. J. of Clin. Lipidol. 5615;3:[id number]. [place] Kitchen    LDL Cholesterol (Calc)  mg/dL (calc)    Comment: . LDL cholesterol not calculated. Triglyceride levels greater than 400 mg/dL invalidate calculated LDL results. . Reference range: <100 . Desirable range <100 mg/dL for primary prevention;   <70 mg/dL for patients with CHD or diabetic patients  with > or = 2 CHD risk factors. [place] Kitchen LDL-C is now calculated using the Martin-Hopkins  calculation, which is a validated novel method providing  better accuracy than the Friedewald equation in the  estimation of LDL-C.  Cresenciano Genre et al. Annamaria Helling. 6147;092(95): 2061-2068  (http://education.QuestDiagnostics.com/faq/FAQ164)    Total CHOL/HDL Ratio 4.5 <5.0 (calc)   Non-HDL Cholesterol (Calc) 108 <130 mg/dL (calc)    Comment: For patients with diabetes plus 1 major ASCVD risk   factor, treating to a non-HDL-C goal of <100 mg/dL  (LDL-C of <70 mg/dL) is considered a therapeutic  option.   POCT glucose (manual entry)     Status: Abnormal  Collection Time: 02/07/19  4:06 PM  Result Value Ref Range   POC Glucose 225 (A) 70 - 99 mg/dl    Objective: There were no vitals filed for this visit.  General: Patient is awake, alert, oriented x 3 and in no acute distress.  Dermatology: Skin is warm and dry bilateral with a partial thickness ulceration present  Plantar fifth metatarsal head on the right foot. Ulceration measures 0.3 cm x 0.2cm x 0.2 cm. There is a keratotic border with a granular base. The ulceration does not  probe to bone. There is no malodor, no active drainage, blanchable localized erythema, no edema. No other acute signs of infection.   Vascular: Dorsalis Pedis pulse = 2/4 Bilateral,  Posterior Tibial pulse = 1/4 Bilateral,  Capillary Fill Time < 5 seconds  Neurologic: Protective sensation severely diminished using  the 5.07/10g [employer].  Musculosketal: There is mild pain with palpation to ulcerated area. No pain with compression to calves bilateral.  Prominent fifth metatarsal heads bilateral with right greater than left.  Xrays, Right foot at area of ulceration there is no bony destruction suggestive of osteomyelitis. No gas in soft tissues.   No results for input(s): GRAMSTAIN, LABORGA in the last 8760 hours.  Assessment and Plan:  Problem List Items Addressed This Visit      Endocrine   Uncontrolled type 2 diabetes mellitus with hyperglycemia ([place])    Other Visit Diagnoses    Pain in right foot    -  Primary   Relevant Medications   silver sulfADIAZINE (SILVADENE) 1 % cream   Right foot ulcer, limited to breakdown of skin ([facility])       Relevant Medications   silver sulfADIAZINE (SILVADENE) 1 % cream   Other Relevant Orders   WOUND CULTURE   Cellulitis and abscess of foot, except toes           -Examined  patient and discussed the progression of the wound and treatment alternatives. -Xrays reviewed - Excisionally dedbrided ulceration at right foot to healthy bleeding borders removing nonviable tissue using a sterile chisel blade. Wound measures post debridement as above. Wound was debrided to the level of the dermis with viable wound base exposed to promote healing. Hemostasis was achieved with manuel pressure. Patient tolerated procedure well without any discomfort or anesthesia necessary for this wound debridement.  -Wound culture obtained will call patient if needed additional antibiotics or a change in antibiotic therapy meanwhile continue with doxycycline -Applied Silvadene and offloading pad and dry sterile dressing and instructed patient to continue with daily dressings at home consisting of same. -Dispensed postop shoe for patient to use as instructed to decrease pressure to the area of ulceration - Advised patient to go to the ER or return to office if the wound worsens or if constitutional symptoms are present. -Patient to return to office in 1 week for follow up care and evaluation or sooner if problems arise.  Landis Martins, DPM

## 2019-02-14 ENCOUNTER — Other Ambulatory Visit: Payer: Self-pay

## 2019-02-14 ENCOUNTER — Ambulatory Visit (HOSPITAL_COMMUNITY)
Admission: RE | Admit: 2019-02-14 | Discharge: 2019-02-14 | Disposition: A | Discharge status: Home / Self Care | Payer: Medicare HMO | Source: Ambulatory Visit | Attending: Cardiology | Admitting: Cardiology

## 2019-02-14 ENCOUNTER — Encounter: Payer: Medicare HMO | Admitting: *Deleted

## 2019-02-14 VITALS — BP 99/60 | HR 84 | Wt 202.0 lb

## 2019-02-14 DIAGNOSIS — Z006 Encounter for examination for normal comparison and control in clinical research program: Secondary | ICD-10-CM

## 2019-02-14 DIAGNOSIS — I5022 Chronic systolic (congestive) heart failure: Secondary | ICD-10-CM | POA: Insufficient documentation

## 2019-02-14 LAB — LIPID PANEL
Cholesterol: 119 mg/dL (ref 0–200)
HDL: 35 mg/dL — ABNORMAL LOW (ref >40)
LDL Cholesterol: 27 mg/dL (ref 0–99)
Total CHOL/HDL Ratio: 3.4 RATIO
Triglycerides: 284 mg/dL — ABNORMAL HIGH (ref <150)
VLDL: 57 mg/dL — ABNORMAL HIGH (ref 0–40)

## 2019-02-14 LAB — WOUND CULTURE
MICRO NUMBER:: 759809
SPECIMEN QUALITY:: ADEQUATE

## 2019-02-14 LAB — HEPATIC FUNCTION PANEL
ALT: 20 U/L (ref 0–44)
AST: 16 U/L (ref 15–41)
Albumin: 3.6 g/dL (ref 3.5–5.0)
Alkaline Phosphatase: 46 U/L (ref 38–126)
Bilirubin, Direct: 0.2 mg/dL (ref 0.0–0.2)
Indirect Bilirubin: 1 mg/dL — ABNORMAL HIGH (ref 0.3–0.9)
Total Bilirubin: 1.2 mg/dL (ref 0.3–1.2)
Total Protein: 6.7 g/dL (ref 6.5–8.1)

## 2019-02-18 ENCOUNTER — Other Ambulatory Visit: Payer: Self-pay

## 2019-02-18 ENCOUNTER — Ambulatory Visit: Payer: Medicare HMO | Admitting: Sports Medicine

## 2019-02-18 ENCOUNTER — Encounter: Payer: Self-pay | Admitting: Sports Medicine

## 2019-02-18 VITALS — Temp 98.1°F

## 2019-02-18 DIAGNOSIS — L02619 Cutaneous abscess of unspecified foot: Secondary | ICD-10-CM

## 2019-02-18 DIAGNOSIS — L97511 Non-pressure chronic ulcer of other part of right foot limited to breakdown of skin: Secondary | ICD-10-CM

## 2019-02-18 DIAGNOSIS — M79671 Pain in right foot: Secondary | ICD-10-CM

## 2019-02-18 DIAGNOSIS — L03119 Cellulitis of unspecified part of limb: Secondary | ICD-10-CM

## 2019-02-18 DIAGNOSIS — E1165 Type 2 diabetes mellitus with hyperglycemia: Secondary | ICD-10-CM

## 2019-02-18 NOTE — Progress Notes (Signed)
Subjective: Vincent Chandler is a 58 y.o. male patient seen in office for follow up evaluation of ulceration of the right plantar forefoot.  Patient reports that he is unsure if it is getting better, patient denies nausea vomiting fever chills or any other constitutional symptoms at this time. Reports that he wears the post op shoe sometimes and has completed antibiotics and has been wrapping the area daily with antibiotic cream with no issues.   Patient Active Problem List   Diagnosis Date Noted  . Uncontrolled type 2 diabetes mellitus with hyperglycemia ([place]) 02/11/2019  . ILD (interstitial lung disease) ([place])   . Chronic periodontitis 08/30/2017  . Atrial flutter ([place]) 08/08/2017  . Dyspnea 08/07/2017  . Hyperglycemia 08/07/2017  . CKD (chronic kidney disease) 2-3 07/19/2014  . NSTEMI (non-ST elevated myocardial infarction) ([place]) 07/18/2014  . OSA (obstructive sleep apnea) 07/18/2014  . Tachycardia   . V tach ([place]) 07/17/2014  . Hypoxemia 07/17/2014  . CHF (congestive heart failure) ([place]) 06/04/2012  . Sinus tachycardia 12/29/2010  . Coronary artery disease prior RCA stent with new inferior Q waves 10/18/2010  . Ischemic and nonischemic cardiomyopathy   10/18/2010  . Uncontrolled type 2 diabetes mellitus without complication, without long-term current use of insulin ([place]) 03/10/2010  . Hyperlipidemia 03/10/2010  . Essential hypertension 03/10/2010   Current Outpatient Medications on File Prior to Visit  Medication Sig Dispense Refill  . ACCU-CHEK FASTCLIX LANCETS MISC Use as directed once a day.  Dx code: E11.9 100 each 1  . albuterol (ACCUNEB) 0.63 MG/3ML nebulizer solution Take 3 mLs (0.63 mg total) by nebulization every 6 (six) hours as needed for wheezing. 75 mL 12  . albuterol (PROAIR HFA) 108 (90 Base) MCG/ACT inhaler Inhale 1-2 puffs into the lungs every 6 (six) hours as needed for wheezing or shortness of breath. (Patient taking differently: Inhale 2 puffs into the lungs every 6  (six) hours as needed for wheezing or shortness of breath. ) 1 Inhaler 3  . atorvastatin (LIPITOR) 40 MG tablet TAKE 1 TABLET (40 MG TOTAL) BY MOUTH AT BEDTIME. 90 tablet 3  . blood glucose meter kit and supplies Dispense based on patient and insurance preference. Use as directed once a day. Dx Code E11.9. 1 each 0  . Blood Glucose Monitoring Suppl (ACCU-CHEK GUIDE) w/Device KIT 1 each by Does not apply route daily. DX Code: E11.9 1 kit 0  . carvedilol (COREG) 12.5 MG tablet Take 2 tablets (25 mg total) by mouth 2 (two) times daily with a meal. (Patient taking differently: Take 12.5 mg by mouth 2 (two) times daily with a meal. ) 180 tablet 3  . digoxin (LANOXIN) 0.125 MG tablet TAKE 1/2 TABLET BY MOUTH DAILY. (Patient taking differently: Take 0.0625 mg by mouth daily. ) 15 tablet 5  . doxycycline (VIBRA-TABS) 100 MG tablet Take 1 tablet (100 mg total) by mouth 2 (two) times daily. 20 tablet 0  . ELIQUIS 5 MG TABS tablet TAKE 1 TABLET BY MOUTH 2 TIMES DAILY. (Patient taking differently: Take 5 mg by mouth 2 (two) times daily. ) 60 tablet 5  . ENTRESTO 49-51 MG TAKE 1 TABLET BY MOUTH 2 TIMES DAILY. 60 tablet 2  . fenofibrate 160 MG tablet Take 1 tablet (160 mg total) by mouth daily. 30 tablet 2  . furosemide (LASIX) 20 MG tablet Take 2 tablets (40 mg total) by mouth daily. 30 tablet 11  . glucose blood (ACCU-CHEK GUIDE) test strip Use as directed once a day.  Dx  code: E11.9 100 each 1  . guaiFENesin (MUCINEX) 600 MG 12 hr tablet Take 1 tablet (600 mg total) by mouth 2 (two) times daily as needed for to loosen phlegm.    . insulin degludec (TRESIBA FLEXTOUCH) 100 UNIT/ML SOPN FlexTouch Pen 10 u Sq daily 3 mL 3  . mometasone-formoterol (DULERA) 200-5 MCG/ACT AERO Inhale 2 puffs into the lungs 2 (two) times a day. 2 Inhaler 0  . nitroGLYCERIN (NITROSTAT) 0.4 MG SL tablet Place 1 tablet (0.4 mg total) under the tongue every 5 (five) minutes as needed for chest pain. 25 tablet 3  . Polyethyl Glycol-Propyl  Glycol (SYSTANE) 0.4-0.3 % SOLN Place 1 drop into both eyes daily.    . predniSONE (DELTASONE) 10 MG tablet Take 1 tablet (10 mg total) by mouth daily. 90 tablet 3  . predniSONE (DELTASONE) 10 MG tablet Take 4 tabs for 2 days, then 3 tabs for 2 days, then 2 tabs for 2 days, then 1 tab for 2 days, then stop (Patient taking differently: Take 10-40 mg by mouth See admin instructions. Take 40 mg by mouth daily for 2 days, then 30 mg by mouth daily for 2 days, then 20 mg by mouth daily for 2 days, then 10 mg by mouth daily for 2 days, then stop) 20 tablet 0  . silver sulfADIAZINE (SILVADENE) 1 % cream Apply 1 application topically daily. 50 g 0  . spironolactone (ALDACTONE) 25 MG tablet Take 1 tablet (25 mg total) by mouth daily. 90 tablet 3   No current facility-administered medications on file prior to visit.    No Known Allergies  Recent Results (from the past 2160 hour(s))  INR/PT     Status: Abnormal   Collection Time: 12/03/18 11:15 AM  Result Value Ref Range   Prothrombin Time 17.3 (H) 11.4 - 15.2 seconds   INR 1.4 (H) 0.8 - 1.2    Comment: (NOTE) INR goal varies based on device and disease states. Performed at [facility], [place] [address], [place], [place] [address]   B Nat Peptide     Status: Abnormal   Collection Time: 12/03/18 11:37 AM  Result Value Ref Range   B Natriuretic Peptide 288.2 (H) 0.0 - 100.0 pg/mL    Comment: Performed at [facility] [address], [place], [place] [address]  Basic metabolic panel     Status: Abnormal   Collection Time: 12/03/18 11:37 AM  Result Value Ref Range   Sodium 140 135 - 145 mmol/L   Potassium 4.8 3.5 - 5.1 mmol/L   Chloride 106 98 - 111 mmol/L   CO2 23 22 - 32 mmol/L   Glucose, Bld 239 (H) 70 - 99 mg/dL   BUN 32 (H) 6 - 20 mg/dL   Creatinine, Ser 1.79 (H) 0.61 - 1.24 mg/dL   Calcium 9.2 8.9 - 10.3 mg/dL   GFR calc non Af Amer 41 (L) >60 mL/min   GFR calc Af Amer 48 (L) >60 mL/min   Anion gap 11 5 - 15    Comment:  Performed at [facility] [address], [place], [place] [address]  Novel Coronavirus, NAA (hospital order; send-out to ref lab)     Status: None   Collection Time: 12/06/18 11:41 AM   Specimen: Nasopharyngeal Swab; Respiratory  Result Value Ref Range   SARS-CoV-2, NAA NOT DETECTED NOT DETECTED    Comment: (NOTE) This test was developed and its performance characteristics determined by [employer]. This test  has not been FDA cleared or approved. This test has been authorized by FDA under an Emergency Use Authorization (EUA). This test is only authorized for the duration of time the declaration that circumstances exist justifying the authorization of the emergency use of in vitro diagnostic tests for detection of SARS-CoV-2 virus and/or diagnosis of COVID-19 infection under section 564(b)(1) of the Act, 21 U.S.C. 361WER-1(V)(4), unless the authorization is terminated or revoked sooner. When diagnostic testing is negative, the possibility of a false negative result should be considered in the context of a patient's recent exposures and the presence of clinical signs and symptoms consistent with COVID-19. An individual without symptoms of COVID-19 and who is not shedding SARS-CoV-2 virus would expect to have a negative (not detected) result in this assay. Performed  At: [facility] [place], [place] [id number] Rush Farmer MD [id number]    Coronavirus Source NASOPHARYNGEAL     Comment: Performed at [facility], [place] [address] [place], [place] [address]  Glucose, capillary     Status: Abnormal   Collection Time: 12/11/18  8:30 AM  Result Value Ref Range   Glucose-Capillary 285 (H) 70 - 99 mg/dL  POCT I-Stat EG7     Status: Abnormal   Collection Time: 12/11/18 10:54 AM  Result Value Ref Range   pH, Ven 7.334 7.250 - 7.430   pCO2, Ven 55.1 44.0 - 60.0 mmHg   pO2, Ven 40.0 32.0 - 45.0 mmHg   Bicarbonate 29.3 (H) 20.0 - 28.0 mmol/L    TCO2 31 22 - 32 mmol/L   O2 Saturation 69.0 %   Acid-Base Excess 2.0 0.0 - 2.0 mmol/L   Sodium 139 135 - 145 mmol/L   Potassium 4.1 3.5 - 5.1 mmol/L   Calcium, Ion 1.29 1.15 - 1.40 mmol/L   HCT 44.0 39.0 - 52.0 %   Hemoglobin 15.0 13.0 - 17.0 g/dL   Patient temperature HIDE    Sample type VENOUS   POCT I-Stat EG7     Status: Abnormal   Collection Time: 12/11/18 10:54 AM  Result Value Ref Range   pH, Ven 7.330 7.250 - 7.430   pCO2, Ven 56.4 44.0 - 60.0 mmHg   pO2, Ven 40.0 32.0 - 45.0 mmHg   Bicarbonate 29.7 (H) 20.0 - 28.0 mmol/L   TCO2 31 22 - 32 mmol/L   O2 Saturation 69.0 %   Acid-Base Excess 2.0 0.0 - 2.0 mmol/L   Sodium 139 135 - 145 mmol/L   Potassium 3.9 3.5 - 5.1 mmol/L   Calcium, Ion 1.27 1.15 - 1.40 mmol/L   HCT 45.0 39.0 - 52.0 %   Hemoglobin 15.3 13.0 - 17.0 g/dL   Patient temperature HIDE    Sample type VENOUS   I-STAT creatinine     Status: Abnormal   Collection Time: 12/11/18 11:09 AM  Result Value Ref Range   Creatinine, Ser 2.00 (H) 0.61 - 1.24 mg/dL  SARS Coronavirus 2 (Performed in [facility] hospital lab)     Status: None   Collection Time: 01/13/19  1:05 PM   Specimen: Nasal Swab  Result Value Ref Range   SARS Coronavirus 2 NEGATIVE NEGATIVE    Comment: (NOTE) SARS-CoV-2 target nucleic acids are NOT DETECTED. The SARS-CoV-2 RNA is generally detectable in upper and lower respiratory specimens during the acute phase of infection. Negative results do not preclude SARS-CoV-2 infection, do not rule out co-infections with other pathogens, and should not be used as the sole basis for treatment or  other patient management decisions. Negative results must be combined with clinical observations, patient history, and epidemiological information. The expected result is Negative. Fact Sheet for Patients: SugarRoll.be Fact Sheet for Healthcare Providers: https://www.woods-mathews.com/ This test is not yet approved or  cleared by the [place] FDA and  has been authorized for detection and/or diagnosis of SARS-CoV-2 by FDA under an Emergency Use Authorization (EUA). This EUA will remain  in effect (meaning this test can be used) for the duration of the COVID-19 declaration under Section 56 4(b)(1) of the Act, 21 U.S.C. section 360bbb-3(b)(1), unless the authorization is terminated or revoked sooner. Performed at [facility], [place] [address] [place], [place] [address]   Pulmonary function test     Status: None (Preliminary result)   Collection Time: 01/16/19 11:59 AM  Result Value Ref Range   FVC-Pre 2.58 L   FVC-%Pred-Pre 52 %   FVC-Post 2.59 L   FVC-%Pred-Post 53 %   FVC-%Change-Post 0 %   FEV1-Pre 2.16 L   FEV1-%Pred-Pre 58 %   FEV1-Post 2.16 L   FEV1-%Pred-Post 58 %   FEV1-%Change-Post 0 %   FEV6-Pre 2.58 L   FEV6-%Pred-Pre 55 %   FEV6-Post 2.59 L   FEV6-%Pred-Post 55 %   FEV6-%Change-Post 0 %   Pre FEV1/FVC ratio 84 %   FEV1FVC-%Pred-Pre 110 %   Post FEV1/FVC ratio 83 %   FEV1FVC-%Change-Post 0 %   Pre FEV6/FVC Ratio 100 %   FEV6FVC-%Pred-Pre 104 %   Post FEV6/FVC ratio 100 %   FEV6FVC-%Pred-Post 104 %   FEF 25-75 Pre 2.51 L/sec   FEF2575-%Pred-Pre 80 %   FEF 25-75 Post 2.40 L/sec   FEF2575-%Pred-Post 76 %   FEF2575-%Change-Post -4 %   RV 0.90 L   RV % pred 41 %   TLC 4.00 L   TLC % pred 57 %   DLCO unc 11.18 ml/min/mmHg   DLCO unc % pred 39 %   DL/VA 2.62 ml/min/mmHg/L   DL/VA % pred 61 %  Hemoglobin A1c     Status: Abnormal   Collection Time: 02/07/19  4:06 PM  Result Value Ref Range   Hgb A1c MFr Bld 11.2 (H) <5.7 % of total Hgb    Comment: For someone without known diabetes, a hemoglobin A1c value of 6.5% or greater indicates that they may have  diabetes and this should be confirmed with a follow-up  test. . For someone with known diabetes, a value <7% indicates  that their diabetes is well controlled and a value  greater than or equal to 7% indicates  suboptimal  control. A1c targets should be individualized based on  duration of diabetes, age, comorbid conditions, and  other considerations. . Currently, no consensus exists regarding use of hemoglobin A1c for diagnosis of diabetes for children. .    Mean Plasma Glucose 275 (calc)   eAG (mmol/L) 15.2 (calc)  Comprehensive metabolic panel     Status: Abnormal   Collection Time: 02/07/19  4:06 PM  Result Value Ref Range   Glucose, Bld 234 (H) 65 - 99 mg/dL    Comment: .            Fasting reference interval . For someone without known diabetes, a glucose value >125 mg/dL indicates that they may have diabetes and this should be confirmed with a follow-up test. .    BUN 43 (H) 7 - 25 mg/dL   Creat 2.30 (H) 0.70 - 1.33 mg/dL    Comment: For patients >49  years of age, the reference limit for Creatinine is approximately 13% higher for people identified as African-American. .    BUN/Creatinine Ratio 19 6 - 22 (calc)   Sodium 142 135 - 146 mmol/L   Potassium 4.5 3.5 - 5.3 mmol/L   Chloride 100 98 - 110 mmol/L   CO2 25 20 - 32 mmol/L   Calcium 9.6 8.6 - 10.3 mg/dL   Total Protein 7.3 6.1 - 8.1 g/dL   Albumin 4.2 3.6 - 5.1 g/dL   Globulin 3.1 1.9 - 3.7 g/dL (calc)   AG Ratio 1.4 1.0 - 2.5 (calc)   Total Bilirubin 0.8 0.2 - 1.2 mg/dL   Alkaline phosphatase (APISO) 60 35 - 144 U/L   AST 11 10 - 35 U/L   ALT 17 9 - 46 U/L  CBC with Differential/Platelet     Status: Abnormal   Collection Time: 02/07/19  4:06 PM  Result Value Ref Range   WBC 9.1 3.8 - 10.8 Thousand/uL   RBC 4.12 (L) 4.20 - 5.80 Million/uL   Hemoglobin 15.1 13.2 - 17.1 g/dL   HCT 44.0 38.5 - 50.0 %   MCV 106.8 (H) 80.0 - 100.0 fL   MCH 36.7 (H) 27.0 - 33.0 pg   MCHC 34.3 32.0 - 36.0 g/dL   RDW 13.2 11.0 - 15.0 %   Platelets 216 140 - 400 Thousand/uL   MPV 9.6 7.5 - 12.5 fL   Neutro Abs 6,361 1,500 - 7,800 cells/uL   Lymphs Abs 1,784 850 - 3,900 cells/uL   Absolute Monocytes 792 200 - 950 cells/uL    Eosinophils Absolute 109 15 - 500 cells/uL   Basophils Absolute 55 0 - 200 cells/uL   Neutrophils Relative % 69.9 %   Total Lymphocyte 19.6 %   Monocytes Relative 8.7 %   Eosinophils Relative 1.2 %   Basophils Relative 0.6 %  Lipid panel     Status: Abnormal   Collection Time: 02/07/19  4:06 PM  Result Value Ref Range   Cholesterol 139 <200 mg/dL   HDL 31 (L) > OR = 40 mg/dL   Triglycerides 476 (H) <150 mg/dL    Comment: . If a non-fasting specimen was collected, consider repeat triglyceride testing on a fasting specimen if clinically indicated.  Yates Decamp et al. J. of Clin. Lipidol. 1941;7:408-144. Marland Kitchen    LDL Cholesterol (Calc)  mg/dL (calc)    Comment: . LDL cholesterol not calculated. Triglyceride levels greater than 400 mg/dL invalidate calculated LDL results. . Reference range: <100 . Desirable range <100 mg/dL for primary prevention;   <70 mg/dL for patients with CHD or diabetic patients  with > or = 2 CHD risk factors. Marland Kitchen LDL-C is now calculated using the Martin-Hopkins  calculation, which is a validated novel method providing  better accuracy than the Friedewald equation in the  estimation of LDL-C.  Cresenciano Genre et al. Annamaria Helling. 8185;631(49): 2061-2068  (http://education.Quest[employer]ostics.com/faq/FAQ164)    Total CHOL/HDL Ratio 4.5 <5.0 (calc)   Non-HDL Cholesterol (Calc) 108 <130 mg/dL (calc)    Comment: For patients with diabetes plus 1 major ASCVD risk  factor, treating to a non-HDL-C goal of <100 mg/dL  (LDL-C of <70 mg/dL) is considered a therapeutic  option.   POCT glucose (manual entry)     Status: Abnormal   Collection Time: 02/07/19  4:06 PM  Result Value Ref Range   POC Glucose 225 (A) 70 - 99 mg/dl  WOUND CULTURE     Status: None   Collection Time: 02/11/19  4:43 PM   Specimen: Foot, Right; Wound  Result Value Ref Range   MICRO NUMBER: [id number]    SPECIMEN QUALITY: Adequate    SOURCE: NOT GIVEN    STATUS: FINAL    GRAM STAIN:      No white blood cells  seen No epithelial cells seen No organisms seen   RESULT:      Growth of skin flora (note: Growth does not include S. aureus, beta-hemolytic Streptococci or P. aeruginosa).  Lipid Profile     Status: Abnormal   Collection Time: 02/14/19 10:04 AM  Result Value Ref Range   Cholesterol 119 0 - 200 mg/dL   Triglycerides 284 (H) <150 mg/dL   HDL 35 (L) >40 mg/dL   Total CHOL/HDL Ratio 3.4 RATIO   VLDL 57 (H) 0 - 40 mg/dL   LDL Cholesterol 27 0 - 99 mg/dL    Comment:        Total Cholesterol/HDL:CHD Risk Coronary Heart Disease Risk Table                     Men   Women  1/2 Average Risk   3.4   3.3  Average Risk       5.0   4.4  2 X Average Risk   9.6   7.1  3 X Average Risk  23.4   11.0        Use the calculated Patient Ratio above and the CHD Risk Table to determine the patient's CHD Risk.        ATP III CLASSIFICATION (LDL):  <100     mg/dL   Optimal  100-129  mg/dL   Near or Above                    Optimal  130-159  mg/dL   Borderline  160-189  mg/dL   High  >190     mg/dL   Very High Performed at [facility] [address] [place], [place] [address]   Hepatic function panel     Status: Abnormal   Collection Time: 02/14/19 10:04 AM  Result Value Ref Range   Total Protein 6.7 6.5 - 8.1 g/dL   Albumin 3.6 3.5 - 5.0 g/dL   AST 16 15 - 41 U/L   ALT 20 0 - 44 U/L   Alkaline Phosphatase 46 38 - 126 U/L   Total Bilirubin 1.2 0.3 - 1.2 mg/dL   Bilirubin, Direct 0.2 0.0 - 0.2 mg/dL   Indirect Bilirubin 1.0 (H) 0.3 - 0.9 mg/dL    Comment: Performed at [facility] [address] [place], [place] [address]    Objective: There were no vitals filed for this visit.  General: Patient is awake, alert, oriented x 3 and in no acute distress.  Dermatology: Skin is warm and dry bilateral with a partial thickness ulceration present  Plantar fifth metatarsal head on the right foot. Ulceration measures 0.2 cm x 0.2cm x 0.2 cm. There is a keratotic border with a granular  base. The ulceration does not  probe to bone. There is no malodor, no active drainage, blanchable localized erythema, no edema. There are new friction blisters to lateral right 4-5 toes from patient wrapping foot too tight with dressing. No other acute signs of infection.   Vascular: Dorsalis Pedis pulse = 2/4 Bilateral,  Posterior Tibial pulse = 1/4 Bilateral,  Capillary Fill Time < 5 seconds  Neurologic: Protective sensation severely diminished  using  the 5.07/10g [employer].  Musculosketal: There is mild pain with palpation to ulcerated area. No pain with compression to calves bilateral.  Prominent fifth metatarsal heads bilateral with right greater than left.  No results for input(s): GRAMSTAIN, LABORGA in the last 8760 hours.  Assessment and Plan:  Problem List Items Addressed This Visit      Endocrine   Uncontrolled type 2 diabetes mellitus with hyperglycemia ([place]incent Chandler)    Other Visit Diagnoses    Right foot ulcer, limited to breakdown of skin ([place])    -  Primary   Cellulitis and abscess of foot, except toes       Pain in right foot         -Examined patient and discussed the progression of the wound and treatment alternatives. - Excisionally dedbrided ulceration at right foot to healthy bleeding borders removing nonviable tissue using a sterile chisel blade. Wound measures post debridement as above. Wound was debrided to the level of the dermis with viable wound base exposed to promote healing. Hemostasis was achieved with manuel pressure. Patient tolerated procedure well without any discomfort or anesthesia necessary for this wound debridement.  -Lanced blisters to toes -Applied Betadine to toes and Iodosorb to plantar right foot and offloading pad and dry sterile dressing and instructed patient to continue with daily dressings at home consisting of same thing Band-Aids and avoid any types of wrap or dressing that may be too tight and irritating to the  area. -Previous wound culture negative no additional need for antibiotics by mouth at this time - Advised patient to go to the ER or return to office if the wound worsens or if constitutional symptoms are present. -Patient to return to office in 2 weeks for follow up care and evaluation or sooner if problems arise.  Landis Martins, DPM

## 2019-02-19 ENCOUNTER — Telehealth: Payer: Self-pay | Admitting: Internal Medicine

## 2019-02-19 NOTE — Telephone Encounter (Signed)
Called and spoke with Patient.  MR recommendations from Dr [name] given. Patient scheduled 02/20/19 at 1100 for follow up with MR.  Nothing further at this time.

## 2019-02-19 NOTE — Telephone Encounter (Signed)
DR [name] wants patient worked into see me. Possilbe in Aug 2020?  Thanks  MR

## 2019-02-20 ENCOUNTER — Encounter: Payer: Self-pay | Admitting: Internal Medicine

## 2019-02-20 ENCOUNTER — Ambulatory Visit: Payer: Medicare HMO | Admitting: Internal Medicine

## 2019-02-20 ENCOUNTER — Other Ambulatory Visit: Payer: Self-pay

## 2019-02-20 VITALS — BP 92/58 | HR 99 | Temp 97.3°F | Ht 69.0 in | Wt 199.8 lb

## 2019-02-20 DIAGNOSIS — J849 Interstitial pulmonary disease, unspecified: Secondary | ICD-10-CM | POA: Diagnosis not present

## 2019-02-20 DIAGNOSIS — Z9229 Personal history of other drug therapy: Secondary | ICD-10-CM | POA: Diagnosis not present

## 2019-02-20 MED ORDER — PREDNISONE 5 MG PO TABS: 5.0000 mg | ORAL_TABLET | Freq: Every day | ORAL | 2 refills | Status: DC

## 2019-02-20 NOTE — Addendum Note (Signed)
Addended by: Jannette Spanner on: 02/20/2019 12:31 PM   Modules accepted: Orders

## 2019-02-20 NOTE — Progress Notes (Signed)
PCP Ann Held, DO  HPI  IOV 09/25/2017  Chief Complaint  Patient presents with   Consult    Referred by Dr. Loralie Champagne for ILD.  HRCT 3/19/189.  Pt denies any complaint of cough, SOB or CP.     58 year old male with chronic systolic heart failure secondary to ischemic heart disease with chronic kidney disease and diabetes and obstructive sleep apnea not yet on CPAP treatment.  This concerned now that he has interstitial lung disease/pulmonary fibrosis not otherwise specified and therefore has been referred here by the cardiology heart failure team  History is gained from talking to the patient and review of the chart.  According to him he had coronary artery bypass grafting in 2014 that then left him with chronic systolic heart failure.  Then in 2016 he had a stent placed and had an admission for that (CT angio  At that time - effusion +/- interistial prominence).  After that he has been admission free up until early February 2019 when he was admitted for acute on chronic systolic heart failure.  During this time he was in atrial fibrillation and then according to his history was loaded with IV amiodarone [it appears he might of failed cardioversion] he was diuresed and discharged on oral amiodarone on August 17, 2017 after a 10-day hospital stay..  With the diuresis he continued to feel better and his shortness of breath and cough improved although he had significant improvement with shortness of breath more than cough.  Just prior to discharge from the hospital he had CT chest (non-HRCT that suggested ILD). Pulmonary function testing same day 08/16/17 that showed moderate restriction with significant severe reduction in diffusion capacity.  Then on August 20, 2017 he had a transesophageal echocardiogram with ejection fraction 15%.  This was then followed with a pulmonary stress test on August 24, 2017 that showed a VO2 max of 12 with high risk heart failure features.   He did desaturate below 90%. Then on September 12, 2017 he had status post ablation for his atrial fibrillation and was able to come off the amiodarone.  Since coming off the amiodarone his cough is continued to improve and he relates his improvement in cough to stopping the amiodarone.  Then in 09/17/17 - > HRCT  - read by Thoracic radiology as Probable UIP (> 50-80% this is UIP; review of prior CT shows heart failure features - so this is new finding. Personally visualized CT).   And on 09/25/2017 => walk test - Walking desaturation test on 09/25/2017 185 feet x 3 laps on ROOM AIR:  did walk all 3 at normal pace. Did not have complaints. Didnot desaturate. Rest pulse ox was 98%, final pulse ox was 90%. HR response 72/min at rest to 88/min at peak exertion. Patient Vincent Chandler  Did no6 Desaturate < 88% . Vincent Chandler did yes  Desaturated </= 3% points. Vincent Chandler did not get tachyardic. These features are c/w MILD/EARLY ILD   Noted: he is considering for LVAD but CVTS feels he is high risk due to ILD and RV failure. Not clear if transplant is part of his goal   ACCP ILD question  - cough x 6 months. 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 No honeycombing inspiratory and expiratory imaging no acute consolidative airspace disease. No pleural effusions. No  suspicious appearing pulmonary nodules or masses are noted.  1. The appearance of the lungs is compatible with interstitial lung disease. The pattern is considered a probable usual interstitial pneumonia (UIP) CT pattern, however, at this time, no definitive honeycombing is identified. Outpatient referral to Pulmonology for further evaluation is suggested in the near future if not already obtained. Repeat high-resolution chest CT is recommended in 12 months to assess for temporal changes in the appearance of the lung parenchyma. 2. Aortic atherosclerosis, in addition to 3 vessel coronary artery disease. Patient is status post median sternotomy for CABG including LIMA to the LAD. 3. Additional incidental findings, as above.  Aortic Atherosclerosis (ICD10-I70.0).   Electronically Signed   By: Vincent Chandler M.D.   Vincent Chandler 10/30/2017  Chief Complaint  Patient presents with   Follow-up    PFT done today. Pt states he has been doing well since last visit and denies any complaints or concerns other than some mild back pain.     Walking desaturation test on 10/30/2017 185 feet x 3 laps on ROOM AIR:  did not  Desaturate to < 88%. Rest pulse ox was 97%, final pulse ox was 90%. HR response 60/min at rest to 70/min at peak exertion. Patient Vincent Chandler  Did not Desaturate < 88% . Vincent Chandler did yes  Desaturated </= 3% points. Vincent Chandler did not get tachyardic. Moderate pace . Denied complaints  Results for Vincent, Chandler (MRN [id number]) as of 10/30/2017 17:01  Ref. Range 09/25/2017 17:03  ASPERGILLUS FUMIGATUS Latest Ref Range: NEGATIVE  NEGATIVE  [place] Serum Latest Ref Range: NEGATIVE  NEGATIVE  Anit Nuclear Antibody(ANA) Latest Ref Range: Negative  Negative  Cyclic Citrullin Peptide Ab Latest Units: UNITS <16  ds DNA Ab Latest Units: IU/mL 5 (H)  Myeloperoxidase Abs Latest Units: AI <1.0  Serine Protease 3 Latest Units: AI 2.9 (H)  RA Latex Turbid. Latest Ref Range: <14 IU/mL <14   SSA (Ro) (ENA) Antibody, IgG Latest Ref Range: <1.0 NEG AI <1.0 NEG  SSB (La) (ENA) Antibody, IgG Latest Ref Range: <1.0 NEG AI <1.0 NEG  Scleroderma (Scl-70) (ENA) Antibody, IgG Latest Ref Range: <1.0 NEG AI <1.0 NEG   Results for Vincent Chandler, Vincent Chandler (MRN [id number]) as of 10/30/2017 17:01  Ref. Range 08/16/2017 14:34 10/30/2017 14:45  FVC-Pre Latest Units: L 2.89 2.81  FVC-%Pred-Pre Latest Units: % 61 59    Results for Vincent Chandler, Vincent Chandler (MRN [id number]) as of 10/30/2017 17:01  Ref. Range 08/16/2017 14:34 10/30/2017 14:45  TLC Latest Units: L 4.09 3.85  TLC % pred Latest Units: % 60 56   Results for Vincent Chandler, Vincent Chandler (MRN [id number]) as of 10/30/2017 17:01  Ref. Range 09/25/2017 17:03  Sed Rate Latest Ref Range: 0 - 20 mm/hr 18   RHC 10/03/2017  RA mean 5 RV 51/5 PA 54/18, mean 31 PCWP mean 12 Oxygen saturations: PA 66% AO 96% Cardiac Output (Fick) 5.22  Cardiac Index (Fick) 2.51 PVR 3.6 WU   OV 12/27/2017  Chief Complaint  Patient  presents with   Follow-up    Pt states he has been doing okay. States he has been coughing up clear phlegm x4 weeks and states his nose becomes stopped up at night. Pt also has had c/o wheezing. Denies any chest tightness.    Vincent Chandler , 58 y.o. , with dob Aug 22, 1960 and male ,Not Hispanic or Latino from [place] [address] - presents to ILD  clinic for followup after bronchoscopy and bronchoalveolar lavage. Results show that he has 70% macro phage [normal is greater than 90% macrophage) and 8% eosinophils [normal is less than 5% eosinophils) which could be reflective of drug induced pneumonitis. Patter is definitely not IPF/UIPO. In the interim he tells me that he speak of a cough now for the last 1 month associated with chest tightness and nocturnal wheezing and sinus drainage. This is definitely new. I did a history retake and he tells me he has been off the amiodarone for over 3 months. In this time he's also been on Entresto for 3 months  [this is known to cause cough]. He feels he needs an inhaler. This no hemoptysis no nausea no vomiting no chills or any other associated symptoms.Marland Kitchen He feels his asthma is coming back. We did exhaled nitric oxide test today - 39 ppb and inderterminate range (< 25 normal, , > 50 abnormal)    Results for ENDRIT, GITTINS (MRN [id number]) as of 12/27/2017 14:25  Ref. Range 11/28/2017 14:12  Color, Fluid Latest Ref Range: YELLOW  COLORLESS (A)  WBC, Fluid Latest Ref Range: 0 - 1,000 cu mm 137  Lymphs, Fluid Latest Units: % 12  Eos, Fluid Latest Units: % 8  Appearance, Fluid Latest Ref Range: CLEAR  CLEAR (A)  Other Cells, Fluid Latest Units: % CORRELATE WITH CY...  Neutrophil Count, Fluid Latest Ref Range: 0 - 25 % 10  Monocyte-Macrophage-Serous Fluid Latest Ref Range: 50 - 90 % 70     OV 02/20/2019  Subjective:  Patient ID: Vincent Chandler, male , DOB: Jan 01, 1961 , age 44 y.o. , MRN: [id number] , ADDRESS: [place] [id number]   02/20/2019 -   Chief Complaint  Patient presents with   Follow-up   ILD followup - suspected amio lung toxicity  HPI Vincent Chandler 59 y.o. -presents for followup. Last seen June 2019 by self. At that time amio lung tox dx given based on hx and BAL . Started on prednisone and tapered to 20m per day. Intent was to do 3-9 month course but he never followed up. THen in June 2020 found to have new exertional dyspnea. Dr MAundra Dubindid cath - PCWP 12 and lasix reduced (Creat 2 with GFR 50s now). Feeling was ex hypoxemia was not related to chf - low ef. Seen pulm NP and 2 months ago started on exertional o2 and nigh o2. 3 weeks ago started on CPAP which he feels helps. Despite exertional hypoxemia he feels he is only some worse. PFT shows decline in FVC below but not dlco as much. HRCT visualized. Prob UIP pattern being called with some possible progression   Antifbirotic ability - on anticoagulants an has IHD - Dr MAundra Dubincomfortable accepting possible CAD.  LFT normal aug 2020. GFR 50s. He is aware of possible GI side effects. He is Vincent adding antifbrotic if MDs conferred and felt indicated given poor outlook for progressive ILD    SYMPTOM SCALE - ILD 02/20/2019   O2 use  ra + occ prn ex o2  Shortness of Breath 0 -> 5 scale with 5 being worst (score 6 If unable to do)  At rest 1  Simple tasks - showers, clothes change, eating, shaving 2  Household (dishes, doing bed, laundry) 3  Shopping 3  Walking level at own pace 3  Walking keeping up with others of same age 18  Walking up Stairs 4  Walking up [place] 4  Total (40 - 48) Dyspnea Score 24  How bad is your cough? 2  How bad is your fatigue 4         Results for Vincent Chandler, Vincent Chandler (MRN [id number]) as of 02/20/2019 11:24  Ref. Range 08/16/2017 14:34 10/30/2017 14:45 01/16/2019 11:59  FVC-Pre Latest Units: L 2.89 2.81 2.58  FVC-%Pred-Pre Latest Units: % 61 59 52  Results for Vincent Chandler, Vincent Chandler (MRN [id number]) as of 02/20/2019 11:24  Ref. Range 08/16/2017 14:34 10/30/2017 14:45 01/16/2019 11:59  DLCO unc Latest Units: ml/min/mmHg 8.35 8.81 11.18  DLCO unc % pred Latest Units: % 26 28 39  Results for Vincent Chandler, Vincent Chandler (MRN [id number]) as of 02/20/2019 11:24  Ref. Range 08/16/2017 14:34 10/30/2017 14:45 01/16/2019 11:59  TLC Latest Units: L 4.09 3.85 4.00  TLC % pred Latest Units: % 60 56 57   ROS - per HPI     has a past medical history of AICD (automatic cardioverter/defibrillator) present, Anginal pain ([place]), Anxiety, Asthma, Atrial fibrillation-postoperative (11/28/2012), Cardiomyopathy, CHF (congestive heart failure) ([place]), Chronic back pain, Coronary artery disease, Depression, Diabetes mellitus type II (dx'd in the 1990's), GERD (gastroesophageal reflux disease), H/O hiatal hernia, Hemorrhoids, History of hypogonadism, History of kidney stones, Hyperlipidemia, Hypertension, OSA on CPAP, Pneumonia, and Urinary incontinence.   reports that he has never smoked. His smokeless tobacco use includes  chew.  Past Surgical History:  Procedure Laterality Date   A-FLUTTER ABLATION N/A 09/12/2017   Procedure: A-FLUTTER ABLATION;  Surgeon: Deboraha Sprang, MD;  Location: [facility];  Service: Cardiovascular;  Laterality: N/A;   CARDIAC DEFIBRILLATOR PLACEMENT  01/15/2015   CARDIOVERSION N/A 08/10/2017   Procedure: CARDIOVERSION;  Surgeon: Larey Dresser, MD;  [facility] ENDOSCOPY;  Service: Cardiovascular;  Laterality: N/A;   CORONARY ANGIOPLASTY WITH STENT PLACEMENT  ~ 2003   CORONARY ARTERY BYPASS GRAFT N/A 08/15/2012   Procedure: CORONARY ARTERY BYPASS GRAFTING (CABG);  Surgeon: Ivin Poot, MD;  Location: [facility];  Service: Open Heart Surgery;  Laterality: N/A;  Coronary Artery Bypass Grafting Times Four Using Left Internal Mammary Artery and Right Saphenous Leg Vein Harvested Endoscopically   EP IMPLANTABLE DEVICE N/A 01/15/2015   Procedure: ICD Implant;  Surgeon: Deboraha Sprang, MD;  Location: [facility];  Service: Cardiovascular;  Laterality: N/A;   LEFT HEART CATHETERIZATION WITH CORONARY ANGIOGRAM N/A 08/08/2012   Procedure: LEFT HEART CATHETERIZATION WITH CORONARY ANGIOGRAM;  Surgeon: Peter M Martinique, MD;  Location: [facility] CATH LAB;  Service: Cardiovascular;  Laterality: N/A;   LEFT HEART CATHETERIZATION WITH CORONARY/GRAFT ANGIOGRAM N/A 07/21/2014   Procedure: LEFT HEART CATHETERIZATION WITH Beatrix Fetters;  Surgeon: Larey Dresser, MD;  Location: [facility] CATH LAB;  Service: Cardiovascular;  Laterality: N/A;   MULTIPLE EXTRACTIONS WITH ALVEOLOPLASTY N/A 10/04/2017   Procedure: Extraction of tooth #'s 5 and 14 with alveoloplasty and gross debridement of remaining teeth;  Surgeon: [name], DDS;  Location: [facility];  Service: Oral Surgery;  Laterality: N/A;   PERCUTANEOUS CORONARY STENT INTERVENTION (PCI-S)  07/21/2014   Procedure: PERCUTANEOUS CORONARY  STENT INTERVENTION (PCI-S);  Surgeon: Larey Dresser, MD;  Location: [facility] CATH LAB;  Service: Cardiovascular;;    REFRACTIVE SURGERY Bilateral 1990's   RIGHT HEART CATH N/A 10/03/2017   Procedure: RIGHT HEART CATH;  Surgeon: Larey Dresser, MD;  Location: [facility];  Service: Cardiovascular;  Laterality: N/A;   RIGHT HEART CATH N/A 12/11/2018   Procedure: RIGHT HEART CATH;  Surgeon: Larey Dresser, MD;  Location: [facility];  Service: Cardiovascular;  Laterality: N/A;   TEE WITHOUT CARDIOVERSION N/A 08/10/2017   Procedure: TRANSESOPHAGEAL ECHOCARDIOGRAM (TEE);  Surgeon: Larey Dresser, MD;  Location: [facility] ENDOSCOPY;  Service: Cardiovascular;  Laterality: N/A;   TONSILLECTOMY  ~ [place] Bilateral 11/28/2017   Procedure: VIDEO BRONCHOSCOPY WITHOUT FLUORO;  Surgeon: Brand Males, MD;  Location: [facility] ENDOSCOPY;  Service: Endoscopy;  Laterality: Bilateral;    No Known Allergies  Immunization History  Administered Date(s) Administered   Influenza Inj Mdck Quad Pf 06/14/2018   Influenza Split 06/05/2012   Influenza Whole 05/09/2010   Influenza,inj,Quad PF,6+ Mos 04/09/2013, 06/05/2014, 08/17/2017   Influenza-Unspecified 04/03/2015   Pneumococcal Conjugate-13 06/05/2014   Pneumococcal Polysaccharide-23 02/17/2012   Td 05/09/2010, 02/07/2019    Family History  Problem Relation Age of Onset   Hypertension Mother    Diabetes Mother    Hypertension Father    Diabetes Father    Hyperlipidemia Father      Current Outpatient Medications:    ACCU-CHEK FASTCLIX LANCETS MISC, Use as directed once a day.  Dx code: E11.9, Disp: 100 each, Rfl: 1   albuterol (ACCUNEB) 0.63 MG/3ML nebulizer solution, Take 3 mLs (0.63 mg total) by nebulization every 6 (six) hours as needed for wheezing., Disp: 75 mL, Rfl: 12   albuterol (PROAIR HFA) 108 (90 Base) MCG/ACT inhaler, Inhale 1-2 puffs into the lungs every 6 (six) hours as needed for wheezing or shortness of breath. (Patient taking differently: Inhale 2 puffs into the lungs every 6 (six) hours as needed for  wheezing or shortness of breath. ), Disp: 1 Inhaler, Rfl: 3   atorvastatin (LIPITOR) 40 MG tablet, TAKE 1 TABLET (40 MG TOTAL) BY MOUTH AT BEDTIME., Disp: 90 tablet, Rfl: 3   blood glucose meter kit and supplies, Dispense based on patient and insurance preference. Use as directed once a day. Dx Code E11.9., Disp: 1 each, Rfl: 0   Blood Glucose Monitoring Suppl (ACCU-CHEK GUIDE) w/Device KIT, 1 each by Does not apply route daily. DX Code: E11.9, Disp: 1 kit, Rfl: 0   carvedilol (COREG) 12.5 MG tablet, Take 2 tablets (25 mg total) by mouth 2 (two) times daily with a meal. (Patient taking differently: Take 12.5 mg by mouth 2 (two) times daily with a meal. ), Disp: 180 tablet, Rfl: 3   digoxin (LANOXIN) 0.125 MG tablet, TAKE 1/2 TABLET BY MOUTH DAILY. (Patient taking differently: Take 0.0625 mg by mouth daily. ), Disp: 15 tablet, Rfl: 5   ELIQUIS 5 MG TABS tablet, TAKE 1 TABLET BY MOUTH 2 TIMES DAILY. (Patient taking differently: Take 5 mg by mouth 2 (two) times daily. ), Disp: 60 tablet, Rfl: 5   ENTRESTO 49-51 MG, TAKE 1 TABLET BY MOUTH 2 TIMES DAILY., Disp: 60 tablet, Rfl: 2   fenofibrate 160 MG tablet, Take 1 tablet (160 mg total) by mouth daily., Disp: 30 tablet, Rfl: 2   furosemide (LASIX) 20 MG tablet, Take 2 tablets (40 mg total) by mouth daily., Disp: 30 tablet, Rfl: 11   glucose  blood (ACCU-CHEK GUIDE) test strip, Use as directed once a day.  Dx code: E11.9, Disp: 100 each, Rfl: 1   guaiFENesin (MUCINEX) 600 MG 12 hr tablet, Take 1 tablet (600 mg total) by mouth 2 (two) times daily as needed for to loosen phlegm., Disp: , Rfl:    insulin degludec (TRESIBA FLEXTOUCH) 100 UNIT/ML SOPN FlexTouch Pen, 10 u Sq daily, Disp: 3 mL, Rfl: 3   mometasone-formoterol (DULERA) 200-5 MCG/ACT AERO, Inhale 2 puffs into the lungs 2 (two) times a day., Disp: 2 Inhaler, Rfl: 0   nitroGLYCERIN (NITROSTAT) 0.4 MG SL tablet, Place 1 tablet (0.4 mg total) under the tongue every 5 (five) minutes as needed  for chest pain., Disp: 25 tablet, Rfl: 3   Polyethyl Glycol-Propyl Glycol (SYSTANE) 0.4-0.3 % SOLN, Place 1 drop into both eyes daily., Disp: , Rfl:    predniSONE (DELTASONE) 10 MG tablet, Take 1 tablet (10 mg total) by mouth daily., Disp: 90 tablet, Rfl: 3   predniSONE (DELTASONE) 10 MG tablet, Take 4 tabs for 2 days, then 3 tabs for 2 days, then 2 tabs for 2 days, then 1 tab for 2 days, then stop (Patient taking differently: Take 10-40 mg by mouth See admin instructions. Take 40 mg by mouth daily for 2 days, then 30 mg by mouth daily for 2 days, then 20 mg by mouth daily for 2 days, then 10 mg by mouth daily for 2 days, then stop), Disp: 20 tablet, Rfl: 0   silver sulfADIAZINE (SILVADENE) 1 % cream, Apply 1 application topically daily., Disp: 50 g, Rfl: 0   spironolactone (ALDACTONE) 25 MG tablet, Take 1 tablet (25 mg total) by mouth daily., Disp: 90 tablet, Rfl: 3      Objective:   Vitals:   02/20/19 1113  BP: (!) 92/58  Pulse: 99  Temp: (!) 97.3 F (36.3 C)  TempSrc: Oral  SpO2: (!) 89%  Weight: 199 lb 12.8 oz (90.6 kg)  Height: _0  (1.753 m)    Estimated body mass index is 29.51 kg/m as calculated from the following:   Height as of this encounter: _1  (1.753 m).   Weight as of this encounter: 199 lb 12.8 oz (90.6 kg).  _2 @  [employer]   02/20/19 1113  Weight: 199 lb 12.8 oz (90.6 kg)     Physical Exam  General Appearance:    Alert, cooperative, no distress, appears stated age - yes , Deconditioned looking - no , OBESE  - no, Sitting on Wheelchair -  no  Head:    Normocephalic, without obvious abnormality, atraumatic  Eyes:    PERRL, conjunctiva/corneas clear,  Ears:    Normal TM's and external ear canals, both ears  Nose:   Nares normal, septum midline, mucosa normal, no drainage    or sinus tenderness. OXYGEN ON  - no . Patient is @ ra   Throat:   Lips, mucosa, and tongue normal; teeth and gums normal. Cyanosis on lips - no  Neck:   Supple,  symmetrical, trachea midline, no adenopathy;    thyroid:  no enlargement/tenderness/nodules; no carotid   bruit or JVD  Back:     Symmetric, no curvature, ROM normal, no CVA tenderness  Lungs:     Distress - no , Wheeze no, Barrell Chest - no, Purse lip breathing - no, Crackles - mild yest at base   Chest Wall:    No tenderness or deformity.    Heart:    Regular rate and rhythm,  S1 and S2 normal, no rub   or gallop, Murmur - no  Breast Exam:    NOT DONE  Abdomen:     Soft, non-tender, bowel sounds active all four quadrants,    no masses, no organomegaly. Visceral obesity - mild  Genitalia:   NOT DONE  Rectal:   NOT DONE  Extremities:   Extremities - normal, Has Cane - no, Clubbing - no, Edema - no  Pulses:   2+ and symmetric all extremities  Skin:   Stigmata of Connective Tissue Disease - no  Lymph nodes:   Cervical, supraclavicular, and axillary nodes normal  Psychiatric:  Neurologic:   Pleasant - yes, Anxious - no, Flat affect - no  CAm-ICU - neg, Alert and Oriented x 3 - yes, Moves all 4s - yes, Speech - normal, Cognition - intact           Assessment:       ICD-10-CM   1. ILD (interstitial lung disease) ([place])  J84.9   2. History of amiodarone therapy  Z92.29        Plan:     Patient Instructions     ICD-10-CM   1. ILD (interstitial lung disease) ([place])  J84.9   2. History of amiodarone therapy  Z92.29     I think your pulmonary fibrosis has progressed in past 1 year despite stopping amiodarone and despite the prednisone  Plan  (d/w Dr Loralie Champagne)  - reduce prednisone to 71m daily - CMA will send script  -start ofev 1019mtwice daily   - we discussed risks, benefits, and limitations  - this is lower dose   - you will need close monitoring with this  Followup - 5-6 weeks with Dr RaChase Caller ILD visit - check LFT and BMET at followup   > 50% of this > 40 min visit spent in face to face counseling or/and coordination of care - by this undersigned MD - Dr  MuBrand MalesThis includes one or more of the following documented above: discussion of test results, diagnostic or treatment recommendations, prognosis, risks and benefits of management options, instructions, education, compliance or risk-factor reduction   SIGNATURE    Dr. MuBrand MalesM.D., F.C.C.P,  Pulmonary and Critical Care Medicine Staff Physician, [place]irector - Interstitial Lung Disease  Program  Pulmonary [id number]: [phone] no answer or between  15:00h - 7:00h: call [phone] Telephone: [phone]  12:09 PM 02/20/2019

## 2019-02-20 NOTE — Patient Instructions (Addendum)
ICD-10-CM   1. ILD (interstitial lung disease) ([place])  J84.9   2. History of amiodarone therapy  Z92.29     I think your pulmonary fibrosis has progressed in past 1 year despite stopping amiodarone and despite the prednisone  Plan  (d/w Dr Loralie Champagne)  - reduce prednisone to 71m daily - CMA will send script  -start ofev 1090mtwice daily   - we discussed risks, benefits, and limitations  - this is lower dose   - you will need close monitoring with this  Followup - 5-6 weeks with Dr RaChase Caller ILD visit - check LFT and BMET at followup - consider ILD pro study at followup

## 2019-02-21 ENCOUNTER — Telehealth: Payer: Self-pay

## 2019-02-21 NOTE — Telephone Encounter (Signed)
New start Ofev- low dose per [name]. Please investigate coverage benefits.

## 2019-02-24 DIAGNOSIS — I129 Hypertensive chronic kidney disease with stage 1 through stage 4 chronic kidney disease, or unspecified chronic kidney disease: Secondary | ICD-10-CM | POA: Diagnosis not present

## 2019-02-24 DIAGNOSIS — N189 Chronic kidney disease, unspecified: Secondary | ICD-10-CM | POA: Diagnosis not present

## 2019-02-24 DIAGNOSIS — N2581 Secondary hyperparathyroidism of renal origin: Secondary | ICD-10-CM | POA: Diagnosis not present

## 2019-02-24 DIAGNOSIS — D631 Anemia in chronic kidney disease: Secondary | ICD-10-CM | POA: Diagnosis not present

## 2019-02-24 DIAGNOSIS — N183 Chronic kidney disease, stage 3 (moderate): Secondary | ICD-10-CM | POA: Diagnosis not present

## 2019-02-24 MED FILL — ENTRESTO 49 MG-51 MG TABLET: 49-51 | 30 days supply | Qty: 60 | Fill #1

## 2019-02-24 NOTE — Telephone Encounter (Signed)
Submitted a Prior Authorization request to [employer] part D for Ofev 165m via Cover My Meds. Will update once we receive a response.

## 2019-02-25 ENCOUNTER — Ambulatory Visit (INDEPENDENT_AMBULATORY_CARE_PROVIDER_SITE_OTHER): Payer: Medicare HMO | Admitting: Internal Medicine

## 2019-02-25 ENCOUNTER — Other Ambulatory Visit: Payer: Self-pay

## 2019-02-25 VITALS — BP 104/69 | HR 71 | Temp 98.8°F | Ht 70.0 in | Wt 200.4 lb

## 2019-02-25 DIAGNOSIS — IMO0001 Reserved for inherently not codable concepts without codable children: Secondary | ICD-10-CM

## 2019-02-25 DIAGNOSIS — N183 Chronic kidney disease, stage 3 (moderate): Secondary | ICD-10-CM | POA: Diagnosis not present

## 2019-02-25 DIAGNOSIS — E1122 Type 2 diabetes mellitus with diabetic chronic kidney disease: Secondary | ICD-10-CM | POA: Diagnosis not present

## 2019-02-25 DIAGNOSIS — E1165 Type 2 diabetes mellitus with hyperglycemia: Secondary | ICD-10-CM

## 2019-02-25 LAB — GLUCOSE, POCT (MANUAL RESULT ENTRY): POC Glucose: 130 mg/dl — AB (ref 70–99)

## 2019-02-25 NOTE — Patient Instructions (Addendum)
-  Continue Metformin 500 mg, 1 Tablet twice a day  - Continue Tresiba to 10 units daily    - Check sugar before Breakfast and Bedtime    -HOW TO TREAT LOW BLOOD SUGARS (Blood sugar LESS THAN 70 MG/DL)  Please follow the RULE OF 15 for the treatment of hypoglycemia treatment (when your (blood sugars are less than 70 mg/dL)    STEP 1: Take 15 grams of carbohydrates when your blood sugar is low, which includes:   3-4 GLUCOSE TABS  OR  3-4 OZ OF JUICE OR REGULAR SODA OR  ONE TUBE OF GLUCOSE GEL     STEP 2: RECHECK blood sugar in 15 MINUTES STEP 3: If your blood sugar is still low at the 15 minute recheck --> then, go back to STEP 1 and treat AGAIN with another 15 grams of carbohydrates.

## 2019-02-25 NOTE — Progress Notes (Signed)
Name: Vincent Chandler  MRN/ DOB: [id number], [birth year]   Age/ Sex: 58 y.o., male    PCP: [name], Alferd Apa, DO   Reason for Endocrinology Evaluation: Type 2 Diabetes Mellitus     Date of Initial Endocrinology Visit: 02/25/2019     PATIENT IDENTIFIER: Vincent Chandler is a 58 y.o. male with a past medical history of DM, ILD, and CHF. The patient presented for initial endocrinology clinic visit on 02/25/2019 for consultative assistance with his diabetes management.    HPI:   Vincent Chandler was diagnosed with lung fibrosis a year ago that , he was  Started on prednisone ~ 7 months ago , currently on 5 mg daily    Diagnosed with DM in 2011 Prior Medications tried/Intolerance: Was on Glimepiride , Janumet , victoza   Currently checking blood sugars 0 x / day Hypoglycemia episodes : no            Hemoglobin A1c has ranged from 6.4% in 2019, peaking at 11.2% in 2020. Patient required assistance for hypoglycemia: no Patient has required hospitalization within the last 1 year from hyper or hypoglycemia: no  In terms of diet, the patient eats 2 meals a day, rarely snacks. He has been drinking sweet tea and soda.    HOME DIABETES REGIMEN: Vincent Chandler 10 units daily  Metformin 500 mg , 1 tablet BID   Statin: yes ACE-I/ARB: no Prior Diabetic Education: no   METER DOWNLOAD SUMMARY: Did not bring    DIABETIC COMPLICATIONS: Microvascular complications:   CKD  Denies: retinopathy, neuropathy   Last eye exam: Completed 2019    Macrovascular complications:   CAD (S/P CABG ) 2014  Denies: PVD, CVA   PAST HISTORY: Past Medical History:  Past Medical History:  Diagnosis Date  . AICD (automatic cardioverter/defibrillator) present   . Anginal pain ([place])   . Anxiety   . Asthma   . Atrial fibrillation-postoperative 11/28/2012  . Cardiomyopathy    alcohol use related  . CHF (congestive heart failure) ([place])   . Chronic back pain   . Coronary artery disease    drug eluting  stent RCA 2005-EF 30%- s/p CABG x 4; 2/4 patent grafts with SVG-PLOM and SVG-RCA system totally occluded. There are collaterals from the LAD system to the RCA and the RCA is totally occluded proximally. There are no collaterals to the LCx territory. He then underwent successful PCI of the mid left circumflex artery with overlapping Synergy drug-eluting stents  . Depression    pt denies  . Diabetes mellitus type II dx'd in the 1990's  . GERD (gastroesophageal reflux disease)    yrs ago  . H/O hiatal hernia   . Hemorrhoids   . History of hypogonadism   . History of kidney stones   . Hyperlipidemia   . Hypertension   . OSA on CPAP    "mask is broken; working on getting a new one" (01/15/2015; 10/03/2017)  . Pneumonia    "3-4 times" (10/03/2017)  . Urinary incontinence    Past Surgical History:  Past Surgical History:  Procedure Laterality Date  . A-FLUTTER ABLATION N/A 09/12/2017   Procedure: A-FLUTTER ABLATION;  Surgeon: Deboraha Sprang, MD;  Location: [place] [facility];  Service: Cardiovascular;  Laterality: N/A;  . CARDIAC DEFIBRILLATOR PLACEMENT  01/15/2015  . CARDIOVERSION N/A 08/10/2017   Procedure: CARDIOVERSION;  Surgeon: Larey Dresser, MD;  Location: [facility] ENDOSCOPY;  Service: Cardiovascular;  Laterality: N/A;  . CORONARY ANGIOPLASTY WITH STENT PLACEMENT  ~  2003  . CORONARY ARTERY BYPASS GRAFT N/A 08/15/2012   Procedure: CORONARY ARTERY BYPASS GRAFTING (CABG);  Surgeon: Ivin Poot, MD;  Location: [place];  Service: Open Heart Surgery;  Laterality: N/A;  Coronary Artery Bypass Grafting Times Four Using Left Internal Mammary Artery and Right Saphenous Leg Vein Harvested Endoscopically  . EP IMPLANTABLE DEVICE N/A 01/15/2015   Procedure: ICD Implant;  Surgeon: Deboraha Sprang, MD;  Location: [place] [facility];  Service: Cardiovascular;  Laterality: N/A;  . LEFT HEART CATHETERIZATION WITH CORONARY ANGIOGRAM N/A 08/08/2012   Procedure: LEFT HEART CATHETERIZATION WITH CORONARY ANGIOGRAM;   Surgeon: Peter M Martinique, MD;  Location: [facility] CATH LAB;  Service: Cardiovascular;  Laterality: N/A;  . LEFT HEART CATHETERIZATION WITH CORONARY/GRAFT ANGIOGRAM N/A 07/21/2014   Procedure: LEFT HEART CATHETERIZATION WITH Beatrix Fetters;  Surgeon: Larey Dresser, MD;  Location: [facility] CATH LAB;  Service: Cardiovascular;  Laterality: N/A;  . MULTIPLE EXTRACTIONS WITH ALVEOLOPLASTY N/A 10/04/2017   Procedure: Extraction of tooth #'s 5 and 14 with alveoloplasty and gross debridement of remaining teeth;  Surgeon: [name], DDS;  Location: [place];  Service: Oral Surgery;  Laterality: N/A;  . PERCUTANEOUS CORONARY STENT INTERVENTION (PCI-S)  07/21/2014   Procedure: PERCUTANEOUS CORONARY STENT INTERVENTION (PCI-S);  Surgeon: Larey Dresser, MD;  Location: [facility] CATH LAB;  Service: Cardiovascular;;  . REFRACTIVE SURGERY Bilateral 1990's  . RIGHT HEART CATH N/A 10/03/2017   Procedure: RIGHT HEART CATH;  Surgeon: Larey Dresser, MD;  Location: [place] [facility];  Service: Cardiovascular;  Laterality: N/A;  . RIGHT HEART CATH N/A 12/11/2018   Procedure: RIGHT HEART CATH;  Surgeon: Larey Dresser, MD;  Location: [place] [facility];  Service: Cardiovascular;  Laterality: N/A;  . TEE WITHOUT CARDIOVERSION N/A 08/10/2017   Procedure: TRANSESOPHAGEAL ECHOCARDIOGRAM (TEE);  Surgeon: Larey Dresser, MD;  Location: [facility] ENDOSCOPY;  Service: Cardiovascular;  Laterality: N/A;  . TONSILLECTOMY  ~ 1970  . VIDEO BRONCHOSCOPY Bilateral 11/28/2017   Procedure: VIDEO BRONCHOSCOPY WITHOUT FLUORO;  Surgeon: Brand Males, MD;  Location: [facility] ENDOSCOPY;  Service: Endoscopy;  Laterality: Bilateral;      Social History:  reports that he has never smoked. His smokeless tobacco use includes chew. He reports current alcohol use of about 3.0 standard drinks of alcohol per week. He reports that he does not use drugs. Family History:  Family History  Problem Relation Age of Onset  . Hypertension Mother   . Diabetes Mother    . Hypertension Father   . Diabetes Father   . Hyperlipidemia Father      HOME MEDICATIONS: Allergies as of 02/25/2019   No Known Allergies     Medication List       Accurate as of February 25, 2019  3:00 PM. If you have any questions, ask your nurse or doctor.        Accu-Chek FastClix Lancets Misc Use as directed once a day.  Dx code: E11.9   Accu-Chek Guide w/Device Kit 1 each by Does not apply route daily. DX Code: E11.9   albuterol 108 (90 Base) MCG/ACT inhaler Commonly known as: ProAir HFA Inhale 1-2 puffs into the lungs every 6 (six) hours as needed for wheezing or shortness of breath. What changed: how much to take   albuterol 0.63 MG/3ML nebulizer solution Commonly known as: ACCUNEB Take 3 mLs (0.63 mg total) by nebulization every 6 (six) hours as needed for wheezing. What changed: Another medication with the same name was changed. Make  sure you understand how and when to take each.   atorvastatin 40 MG tablet Commonly known as: LIPITOR TAKE 1 TABLET (40 MG TOTAL) BY MOUTH AT BEDTIME.   blood glucose meter kit and supplies Dispense based on patient and insurance preference. Use as directed once a day. Dx Code E11.9.   carvedilol 12.5 MG tablet Commonly known as: COREG Take 2 tablets (25 mg total) by mouth 2 (two) times daily with a meal. What changed: how much to take   digoxin 0.125 MG tablet Commonly known as: LANOXIN TAKE 1/2 TABLET BY MOUTH DAILY.   Eliquis 5 MG Tabs tablet Generic drug: apixaban TAKE 1 TABLET BY MOUTH 2 TIMES DAILY. What changed: how much to take   Entresto 49-51 MG Generic drug: sacubitril-valsartan TAKE 1 TABLET BY MOUTH 2 TIMES DAILY.   fenofibrate 160 MG tablet Take 1 tablet (160 mg total) by mouth daily.   furosemide 20 MG tablet Commonly known as: Lasix Take 2 tablets (40 mg total) by mouth daily.   glucose blood test strip Commonly known as: Accu-Chek Guide Use as directed once a day.  Dx code: E11.9    guaiFENesin 600 MG 12 hr tablet Commonly known as: MUCINEX Take 1 tablet (600 mg total) by mouth 2 (two) times daily as needed for to loosen phlegm.   mometasone-formoterol 200-5 MCG/ACT Aero Commonly known as: DULERA Inhale 2 puffs into the lungs 2 (two) times a day.   nitroGLYCERIN 0.4 MG SL tablet Commonly known as: NITROSTAT Place 1 tablet (0.4 mg total) under the tongue every 5 (five) minutes as needed for chest pain.   predniSONE 5 MG tablet Commonly known as: DELTASONE Take 1 tablet (5 mg total) by mouth daily with breakfast.   silver sulfADIAZINE 1 % cream Commonly known as: Silvadene Apply 1 application topically daily.   spironolactone 25 MG tablet Commonly known as: ALDACTONE Take 1 tablet (25 mg total) by mouth daily.   Systane 0.4-0.3 % Soln Generic drug: Polyethyl Glycol-Propyl Glycol Place 1 drop into both eyes daily.   Vincent Chandler FlexTouch 100 UNIT/ML Sopn FlexTouch Pen Generic drug: insulin degludec 10 u Sq daily        ALLERGIES: No Known Allergies   REVIEW OF SYSTEMS: A comprehensive ROS was conducted with the patient and is negative except as per HPI and below:  Review of Systems  Constitutional: Negative for chills and fever.  HENT: Negative for congestion and sore throat.   Eyes: Positive for blurred vision. Negative for pain.  Respiratory: Negative for cough and shortness of breath.   Cardiovascular: Negative for chest pain and palpitations.  Gastrointestinal: Negative for diarrhea and nausea.  Genitourinary: Negative for frequency.  Neurological: Negative for tingling and tremors.  Endo/Heme/Allergies: Negative for polydipsia.  Psychiatric/Behavioral: Negative for depression. The patient is not nervous/anxious.       OBJECTIVE:   VITAL SIGNS: BP 104/69 (BP Location: Left Arm, Patient Position: Sitting, Cuff Size: Normal)   Pulse 71   Temp 98.8 F (37.1 C)   Ht _0  (1.778 m)   Wt 200 lb 6.4 oz (90.9 kg)   SpO2 96%   BMI 28.75  kg/m    PHYSICAL EXAM:  General: Pt appears well and is in NAD  Hydration: Well-hydrated with moist mucous membranes and good skin turgor  HEENT: Head: Unremarkable with good dentition. Oropharynx clear without exudate.  Eyes: External eye exam normal without stare, lid lag or exophthalmos.  EOM intact.   Neck: General: Supple without adenopathy or  carotid bruits. Thyroid: Thyroid size normal.  No goiter or nodules appreciated. No thyroid bruit.  Lungs: Clear with good BS bilat with no rales, rhonchi, or wheezes  Heart: RRR with normal S1 and S2 and no gallops; no murmurs; no rub  Abdomen: Normoactive bowel sounds, soft, nontender, without masses or organomegaly palpable  Extremities:  Lower extremities - No pretibial edema. No lesions.  Skin: Normal texture and temperature to palpation. No rash noted. No Acanthosis nigricans/skin tags. No lipohypertrophy.  Neuro: MS is good with appropriate affect, pt is alert and Ox3    DM foot exam: 02/25/19  The skin of the feet is intact without sores or ulcerations. The pedal pulses are 2+ on right and 2+ on left. The sensation is intact to a screening 5.07, 10 gram monofilament bilaterally   DATA REVIEWED:  Lab Results  Component Value Date   HGBA1C 11.2 (H) 02/07/2019   HGBA1C 6.4 11/20/2017   HGBA1C 8.7 (H) 08/17/2017   Lab Results  Component Value Date   MICROALBUR 1.9 09/21/2015   LDLCALC 27 02/14/2019   CREATININE 2.30 (H) 02/07/2019   Lab Results  Component Value Date   MICRALBCREAT 0.8 09/21/2015    Lab Results  Component Value Date   CHOL 119 02/14/2019   HDL 35 (L) 02/14/2019   LDLCALC 27 02/14/2019   LDLDIRECT 69.0 11/20/2017   TRIG 284 (H) 02/14/2019   CHOLHDL 3.4 02/14/2019       In-Office BG 130 mg/dL   ASSESSMENT / PLAN / RECOMMENDATIONS:   1) Type 2 Diabetes Mellitus, Poorly controlled, With Macrovascular complications - Most recent A1c of 11.2 %. Goal A1c < 7.0 %.    Plan: GENERAL:  Poorly  controlled diabetes is multifactorial given he was on prednisone for ~ 8 months for the past year which increases his CHO sensitivity but also he admits to dietary indiscretions during that time as well.   He is currently being tapered off Prednisone and has made dietary changes  His In-office BG is 130 mg/dL with eating breakfast so I will not be making any changes, but I have encouraged him to check his glucose at home  I have discussed with the patient the pathophysiology of diabetes. We went over the natural progression of the disease. We talked about both insulin resistance and insulin deficiency. We stressed the importance of lifestyle changes including diet and exercise. I explained the complications associated with diabetes including retinopathy, nephropathy, neuropathy as well as increased risk of cardiovascular disease. We went over the benefit seen with glycemic control.    I explained to the patient that diabetic patients are at higher than normal risk for amputations.   If his GFR falls below 35, Metformin will have to stop    MEDICATIONS: Continue Metformin 500 mg, 1 Tablet twice a day  Continue Tresiba to 10 units daily   EDUCATION / INSTRUCTIONS:  BG monitoring instructions: Patient is instructed to check his blood sugars 2 times a day, fasting and bedtime  Call [facility] if: BG persistently < 70 or > 300. . I reviewed the Rule of 15 for the treatment of hypoglycemia in detail with the patient. Literature supplied.   2) Diabetic complications:   Eye: Does not have known diabetic retinopathy.   Neuro/ Feet: Does not have known diabetic peripheral neuropathy. Renal: Patient does  have known baseline CKD.   3) Lipids: Patient is  on a statin.   F/u in 3 months  Signed electronically by: Mack Guise, MD  [facility]  [facility] [address] [place], [place] [address] Phone: [phone]  FAX: [phone]   CC: [address] [place] [address] Phone: [phone]  Fax: [phone]    Return to Endocrinology clinic as below: Future Appointments  Date Time Provider [place]  03/11/2019 11:00 AM Landis Martins, DPM [facility]  03/25/2019 12:00 PM Larey Dresser, MD [facility] None

## 2019-02-25 NOTE — Telephone Encounter (Signed)
Received notification from [facility] regarding a prior authorization for Ofev 137m. Authorization has been APPROVED from 07/01/18 to 07/03/19.   Phone # [phone] Ran test claim, Patient's copay for 1 month supply is $1,302.66. Patient will need to sign up for Ofev Patient Assistance Program.  8:30 AM RBeatriz Chancellor CPhT

## 2019-02-26 ENCOUNTER — Encounter: Payer: Self-pay | Admitting: Internal Medicine

## 2019-02-26 DIAGNOSIS — N183 Chronic kidney disease, stage 3 unspecified: Secondary | ICD-10-CM | POA: Insufficient documentation

## 2019-02-26 DIAGNOSIS — E1122 Type 2 diabetes mellitus with diabetic chronic kidney disease: Secondary | ICD-10-CM | POA: Insufficient documentation

## 2019-02-27 ENCOUNTER — Other Ambulatory Visit: Payer: Self-pay | Admitting: Nephrology

## 2019-02-27 DIAGNOSIS — N183 Chronic kidney disease, stage 3 unspecified: Secondary | ICD-10-CM

## 2019-02-27 DIAGNOSIS — H524 Presbyopia: Secondary | ICD-10-CM | POA: Diagnosis not present

## 2019-02-27 DIAGNOSIS — H25043 Posterior subcapsular polar age-related cataract, bilateral: Secondary | ICD-10-CM | POA: Diagnosis not present

## 2019-02-27 DIAGNOSIS — H5202 Hypermetropia, left eye: Secondary | ICD-10-CM | POA: Diagnosis not present

## 2019-02-27 DIAGNOSIS — H52203 Unspecified astigmatism, bilateral: Secondary | ICD-10-CM | POA: Diagnosis not present

## 2019-02-27 DIAGNOSIS — G4733 Obstructive sleep apnea (adult) (pediatric): Secondary | ICD-10-CM | POA: Diagnosis not present

## 2019-02-28 NOTE — Telephone Encounter (Signed)
Attempted to contact patient to advise of approval and expensive copay ($1300 for 1 month). Will need to move forward with Patient Assistance. Will need income documents to submit application. No answer or voicemail.  Will keep following up.  3:37 PM Beatriz Chancellor, CPhT

## 2019-03-03 MED FILL — FUROSEMIDE 20 MG TABS: 20 | 30 days supply | Qty: 60 | Fill #3

## 2019-03-05 NOTE — Telephone Encounter (Signed)
Attempted to contact patient to f/u on starting Ofev PAP. No answer/vm available.  Brenton Grills, Student-PharmD 03/05/2019 11:51 AM

## 2019-03-06 DIAGNOSIS — J849 Interstitial pulmonary disease, unspecified: Secondary | ICD-10-CM | POA: Diagnosis not present

## 2019-03-06 DIAGNOSIS — H2513 Age-related nuclear cataract, bilateral: Secondary | ICD-10-CM | POA: Diagnosis not present

## 2019-03-06 DIAGNOSIS — I2510 Atherosclerotic heart disease of native coronary artery without angina pectoris: Secondary | ICD-10-CM | POA: Diagnosis not present

## 2019-03-06 DIAGNOSIS — H25043 Posterior subcapsular polar age-related cataract, bilateral: Secondary | ICD-10-CM | POA: Diagnosis not present

## 2019-03-06 DIAGNOSIS — J45909 Unspecified asthma, uncomplicated: Secondary | ICD-10-CM | POA: Diagnosis not present

## 2019-03-06 DIAGNOSIS — I509 Heart failure, unspecified: Secondary | ICD-10-CM | POA: Diagnosis not present

## 2019-03-07 ENCOUNTER — Ambulatory Visit (HOSPITAL_COMMUNITY)
Admission: RE | Admit: 2019-03-07 | Discharge: 2019-03-07 | Disposition: A | Discharge status: Home / Self Care | Payer: Medicare HMO | Source: Ambulatory Visit | Attending: Cardiology | Admitting: Cardiology

## 2019-03-07 ENCOUNTER — Encounter (HOSPITAL_COMMUNITY): Payer: Self-pay | Admitting: Cardiology

## 2019-03-07 ENCOUNTER — Other Ambulatory Visit: Payer: Self-pay

## 2019-03-07 VITALS — BP 128/60 | HR 98 | Wt 200.4 lb

## 2019-03-07 DIAGNOSIS — Z79899 Other long term (current) drug therapy: Secondary | ICD-10-CM | POA: Diagnosis not present

## 2019-03-07 DIAGNOSIS — E1122 Type 2 diabetes mellitus with diabetic chronic kidney disease: Secondary | ICD-10-CM | POA: Diagnosis not present

## 2019-03-07 DIAGNOSIS — Z833 Family history of diabetes mellitus: Secondary | ICD-10-CM | POA: Insufficient documentation

## 2019-03-07 DIAGNOSIS — J8410 Pulmonary fibrosis, unspecified: Secondary | ICD-10-CM | POA: Diagnosis not present

## 2019-03-07 DIAGNOSIS — J849 Interstitial pulmonary disease, unspecified: Secondary | ICD-10-CM | POA: Diagnosis not present

## 2019-03-07 DIAGNOSIS — G4733 Obstructive sleep apnea (adult) (pediatric): Secondary | ICD-10-CM | POA: Diagnosis not present

## 2019-03-07 DIAGNOSIS — Z8249 Family history of ischemic heart disease and other diseases of the circulatory system: Secondary | ICD-10-CM | POA: Diagnosis not present

## 2019-03-07 DIAGNOSIS — Z95810 Presence of automatic (implantable) cardiac defibrillator: Secondary | ICD-10-CM | POA: Insufficient documentation

## 2019-03-07 DIAGNOSIS — Z951 Presence of aortocoronary bypass graft: Secondary | ICD-10-CM | POA: Insufficient documentation

## 2019-03-07 DIAGNOSIS — I4892 Unspecified atrial flutter: Secondary | ICD-10-CM | POA: Insufficient documentation

## 2019-03-07 DIAGNOSIS — Z7952 Long term (current) use of systemic steroids: Secondary | ICD-10-CM | POA: Insufficient documentation

## 2019-03-07 DIAGNOSIS — I5022 Chronic systolic (congestive) heart failure: Secondary | ICD-10-CM | POA: Insufficient documentation

## 2019-03-07 DIAGNOSIS — E7849 Other hyperlipidemia: Secondary | ICD-10-CM | POA: Insufficient documentation

## 2019-03-07 DIAGNOSIS — I255 Ischemic cardiomyopathy: Secondary | ICD-10-CM | POA: Insufficient documentation

## 2019-03-07 DIAGNOSIS — Z7951 Long term (current) use of inhaled steroids: Secondary | ICD-10-CM | POA: Diagnosis not present

## 2019-03-07 DIAGNOSIS — Z7901 Long term (current) use of anticoagulants: Secondary | ICD-10-CM | POA: Insufficient documentation

## 2019-03-07 DIAGNOSIS — Z794 Long term (current) use of insulin: Secondary | ICD-10-CM | POA: Diagnosis not present

## 2019-03-07 DIAGNOSIS — N183 Chronic kidney disease, stage 3 (moderate): Secondary | ICD-10-CM | POA: Insufficient documentation

## 2019-03-07 DIAGNOSIS — I130 Hypertensive heart and chronic kidney disease with heart failure and stage 1 through stage 4 chronic kidney disease, or unspecified chronic kidney disease: Secondary | ICD-10-CM | POA: Insufficient documentation

## 2019-03-07 DIAGNOSIS — I272 Pulmonary hypertension, unspecified: Secondary | ICD-10-CM | POA: Insufficient documentation

## 2019-03-07 DIAGNOSIS — Z87442 Personal history of urinary calculi: Secondary | ICD-10-CM | POA: Diagnosis not present

## 2019-03-07 DIAGNOSIS — I2510 Atherosclerotic heart disease of native coronary artery without angina pectoris: Secondary | ICD-10-CM | POA: Insufficient documentation

## 2019-03-07 DIAGNOSIS — I480 Paroxysmal atrial fibrillation: Secondary | ICD-10-CM | POA: Diagnosis not present

## 2019-03-07 LAB — BRAIN NATRIURETIC PEPTIDE: B Natriuretic Peptide: 278.9 pg/mL — ABNORMAL HIGH (ref 0.0–100.0)

## 2019-03-07 LAB — LIPID PANEL
Cholesterol: 105 mg/dL (ref 0–200)
HDL: 30 mg/dL — ABNORMAL LOW (ref >40)
LDL Cholesterol: 34 mg/dL (ref 0–99)
Total CHOL/HDL Ratio: 3.5 RATIO
Triglycerides: 205 mg/dL — ABNORMAL HIGH (ref <150)
VLDL: 41 mg/dL — ABNORMAL HIGH (ref 0–40)

## 2019-03-07 LAB — DIGOXIN LEVEL: Digoxin Level: 0.6 ng/mL — ABNORMAL LOW (ref 0.8–2.0)

## 2019-03-07 LAB — BASIC METABOLIC PANEL
Anion gap: 10 (ref 5–15)
BUN: 33 mg/dL — ABNORMAL HIGH (ref 6–20)
CO2: 22 mmol/L (ref 22–32)
Calcium: 9.2 mg/dL (ref 8.9–10.3)
Chloride: 109 mmol/L (ref 98–111)
Creatinine, Ser: 1.9 mg/dL — ABNORMAL HIGH (ref 0.61–1.24)
GFR calc Af Amer: 44 mL/min — ABNORMAL LOW (ref >60)
GFR calc non Af Amer: 38 mL/min — ABNORMAL LOW (ref >60)
Glucose, Bld: 109 mg/dL — ABNORMAL HIGH (ref 70–99)
Potassium: 4.3 mmol/L (ref 3.5–5.1)
Sodium: 141 mmol/L (ref 135–145)

## 2019-03-07 MED ORDER — PREDNISONE 10 MG PO TABS: 10.0000 mg | ORAL_TABLET | Freq: Every day | ORAL | 6 refills | Status: DC

## 2019-03-07 MED FILL — predniSONE 10 MG TABS: 10 | 30 days supply | Qty: 30 | Fill #0

## 2019-03-07 NOTE — Telephone Encounter (Signed)
Called patient and discussed benefits investigation results and the process of patient assistance. We will need proof of income for PAP application. Patient stated he only receives disability and does not have any statements as it gets direct deposited. Patient will bring by a month of bank statements to be submitted with his Ofev PAP application.   Will follow up. 12:08 PM

## 2019-03-07 NOTE — Patient Instructions (Addendum)
INCREASE Prednisone to 65m (1 tab) daily  Labs today We will only contact you if something comes back abnormal or we need to make some changes. Otherwise no news is good news!  WEAR Oxygen 3L ALL THE TIME  Your physician recommends that you schedule a follow-up appointment in: 3 months with Dr MAundra Dubin  At [facility] you and your health needs are our priority. As part of our continuing mission to provide you with exceptional heart care, we have created designated Provider Care Teams. These Care Teams include your primary Cardiologist (physician) and Advanced Practice Providers (APPs- Physician Assistants and Nurse Practitioners) who all work together to provide you with the care you need, when you need it.   You may see any of the following providers on your designated Care Team at your next follow up: .Marland KitchenDr DGlori Bickers. Dr DLoralie Champagne. ADarrick Grinder NP   Please be sure to bring in all your medications bottles to every appointment.  '

## 2019-03-07 NOTE — Progress Notes (Signed)
Medication Samples have been provided to the patient.  Drug name: ELiquis       Strength: 77m        Qty: 28  LOT: [id number] Exp.Date: 12/22  Dosing instructions: 1 tab twice day  The patient has been instructed regarding the correct time, dose, and frequency of taking this medication, including desired effects and most common side effects.   Vincent Chandler 11:05 AM 03/07/2019

## 2019-03-08 NOTE — Progress Notes (Signed)
Date:  03/08/2019   ID:  Vincent Chandler, DOB 10/30/1960, MRN [id number]   Provider location: [facility] Type of Visit: Established patient  PCP:  Ann Held, DO  Cardiologist: Dr. [name]   History of Present Illness: Vincent Chandler is a 58 y.o. male who has a history of A flutter, chronic systolic heart failure, CAD s/p CABG, CKD Stage III, DMII, OSA .  Admitted 08/07/17 with atrial flutter/RVR and acute on chronic systolic heart failure. Underwent successful DC/CV on 2/8. Required short term milrinone but was able to wean off. HF meds started. Echo this admission with EF 15%. D/C weight 201 pounds.  CPX in 2/19 showed severe functional impairment due to heart failure. However, PFTs were restrictive and high resolution CT chest was concerning for interstitial lung disease.   He has had atrial flutter ablation.   He had RHC in 4/19 showing preserved cardiac output and low filling pressures, but moderate pulmonary hypertension.   He saw pulmonary regarding interstitial lung disease and had bronchoscopy.  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  He has been started on prednisone, cough is much improved on prednisone.    He saw Dr. Caryl Comes, not planning to upgrade ICD to CRT given IVCD but not LBBB-like.   He had a Barostim device placed in 1/20.   I tried him on [place] but he developed orthostasis after starting it and had to stop.   He has been on prednisone for suspected amiodarone pulmonary toxicity/pulmonary fibrosis.  He has had a chronic cough.   With worsened dyspnea, I took him for RHC in 6/20.  This showed normal filling pressures and low but not markedly low cardiac output. High resolution CT chest in 6/20 showed interstitial lung disease in UIP patttern. He saw pulmonary and prednisone was increased then tapered back down.   He returns today for followup of CHF and CAD.  He saw [name] earlier this summer.   Ofev was recommended for his pulmonary fibrosis, but he has not received the medication yet.  He feels weak/exhausted/short of breath when walking in the grocery store.  He is supposed to wear 3L [place] by nasal cannula but does not use it at all times.  He is using CPAP at night.  No chest pain. No lightheadedness/syncope. He says that he has felt worse since prednisone was decreased. Weight today is down 7 lbs since last appointment.  Oxygen saturation 83% on RA.    Labs (2/19): free T4 increased but free T3 normal, LDL 86, HDL 30 Labs (3/19): K 4.9, creatinine 1.53, hgb 14.3, LDL 52, ANA negative, RF negative Labs (4/19): K 4.2, creatinine 1.27 Labs (5/19): LDL 69, K 4.4, creatinine 1.36 Labs (8/19): K 5.3, creatinine 2.2 Labs (10/19): K 5, creatinine 1.49 Labs (12/19): hgb 13.4 Labs (2/20): K 4.2, creatinine 1.45, hgb 14.3 Labs (5/20): K 4.2, creatinine 1.81, digoxin 0.6, LDL 16, TGs 396, hgb 13.8 Labs (6/20): creatinine 2 Labs (8/20): LDL 27, Tgs 284, LFTs normal, K 4.5, creatinine 2.3.   ECG (personally reviewed): NSR, PACs, PVC  PMH:  1. Atrial fibrillation: Noted post-op CABG.  2. Atrial flutter: Noted during 2/19 admission, DCCV done.  S/p Ablation 3/19.  3. CAD: s/p CABG.  - LHC (1/16):  Patent LIMA-LAD and SVG-D. 80% mLCx and 99% PLOM, total occlusion of SVG-PLOM.  Total occlusion of RCA with occlusion of SVG-RCA.  Patient had DES to mLCx-PLOM.  4. HTN 5. Type II diabetes  6. Hyperlipidemia 7. Chronic systolic CHF: Ischemic cardiomyopathy.   - Echo (2/19): EF 15%, mildly dilated LV, mild LVH, inferolateral akinesis, milr MR, mildly dilated RV with severely reduced systolic function.  - CPX (2/19): Peak VO2 12.2, VE/VCO2 slope 53, RER 1.07 => severe limitation from heart failure.  - Medtronic ICD  - RHC (4/19): mean RA 5, PA 54/18 mean 31, mean PCWP 12, CI 2.5, PVR 3.6 WU - Echo (1/20): EF 20% - Barostimulator device placed in 1/20.  - RHC (6/20): mean RA 6, PA 42/17 mean 27,  mean PCWP 12, CI 2.29, PVR 3.1  8. Interstitial lung disease: PFTs (2/19) were restrictive, raising concern for ILD.  - High resolution CT chest in 2/19: interstitial lung disease concerning for usual interstitial pneumonitis. - Bronchoscopy was done, possible amiodarone lung toxicity causing ILD, now on prednisone.  - High resolution chest CT (6/20): ILD in UIP pattern.  9. CKD stage 3 10. OSA: Home sleep study in 2020 with severe OSA.  11. ABIs (2/19): not significantly abnormal 12. Carotid dopplers (2/19): Mild disease only.  - Carotid dopplers (1/20): Minimal stenosis.  13. Pulmonary hypertension: ?Group 3 due to ILD.  14. Nephrolithiasis   Current Outpatient Medications  Medication Sig Dispense Refill   ACCU-CHEK FASTCLIX LANCETS MISC Use as directed once a day.  Dx code: E11.9 100 each 1   albuterol (ACCUNEB) 0.63 MG/3ML nebulizer solution Take 3 mLs (0.63 mg total) by nebulization every 6 (six) hours as needed for wheezing. 75 mL 12   albuterol (PROAIR HFA) 108 (90 Base) MCG/ACT inhaler Inhale 1-2 puffs into the lungs every 6 (six) hours as needed for wheezing or shortness of breath. (Patient taking differently: Inhale 2 puffs into the lungs every 6 (six) hours as needed for wheezing or shortness of breath. ) 1 Inhaler 3   atorvastatin (LIPITOR) 40 MG tablet TAKE 1 TABLET (40 MG TOTAL) BY MOUTH AT BEDTIME. 90 tablet 3   blood glucose meter kit and supplies Dispense based on patient and insurance preference. Use as directed once a day. Dx Code E11.9. 1 each 0   Blood Glucose Monitoring Suppl (ACCU-CHEK GUIDE) w/Device KIT 1 each by Does not apply route daily. DX Code: E11.9 1 kit 0   carvedilol (COREG) 12.5 MG tablet Take 2 tablets (25 mg total) by mouth 2 (two) times daily with a meal. (Patient taking differently: Take 12.5 mg by mouth 2 (two) times daily with a meal. ) 180 tablet 3   digoxin (LANOXIN) 0.125 MG tablet TAKE 1/2 TABLET BY MOUTH DAILY. (Patient taking differently:  Take 0.0625 mg by mouth daily. ) 15 tablet 5   ELIQUIS 5 MG TABS tablet TAKE 1 TABLET BY MOUTH 2 TIMES DAILY. (Patient taking differently: Take 5 mg by mouth 2 (two) times daily. ) 60 tablet 5   ENTRESTO 49-51 MG TAKE 1 TABLET BY MOUTH 2 TIMES DAILY. 60 tablet 2   fenofibrate 160 MG tablet Take 1 tablet (160 mg total) by mouth daily. 30 tablet 2   furosemide (LASIX) 20 MG tablet Take 2 tablets (40 mg total) by mouth daily. 30 tablet 11   glucose blood (ACCU-CHEK GUIDE) test strip Use as directed once a day.  Dx code: E11.9 100 each 1   guaiFENesin (MUCINEX) 600 MG 12 hr tablet Take 1 tablet (600 mg total) by mouth 2 (two) times daily as needed for to loosen phlegm.     insulin degludec (TRESIBA FLEXTOUCH) 100  UNIT/ML SOPN FlexTouch Pen 10 u Sq daily 3 mL 3   metFORMIN (GLUCOPHAGE) 500 MG tablet Take 500 mg by mouth 2 (two) times daily with a meal.     mometasone-formoterol (DULERA) 200-5 MCG/ACT AERO Inhale 2 puffs into the lungs 2 (two) times a day. 2 Inhaler 0   nitroGLYCERIN (NITROSTAT) 0.4 MG SL tablet Place 1 tablet (0.4 mg total) under the tongue every 5 (five) minutes as needed for chest pain. 25 tablet 3   Polyethyl Glycol-Propyl Glycol (SYSTANE) 0.4-0.3 % SOLN Place 1 drop into both eyes daily.     predniSONE (DELTASONE) 10 MG tablet Take 1 tablet (10 mg total) by mouth daily with breakfast. 30 tablet 6   silver sulfADIAZINE (SILVADENE) 1 % cream Apply 1 application topically daily. 50 g 0   spironolactone (ALDACTONE) 25 MG tablet Take 1 tablet (25 mg total) by mouth daily. 90 tablet 3   No current facility-administered medications for this encounter.     Allergies:   Patient has no known allergies.   Social History:  The patient  reports that he has never smoked. His smokeless tobacco use includes chew. He reports current alcohol use of about 3.0 standard drinks of alcohol per week. He reports that he does not use drugs.   Family History:  The patient's family history  includes Diabetes in his father and mother; Hyperlipidemia in his father; Hypertension in his father and mother.   ROS:  Please see the history of present illness.   All other systems are personally reviewed and negative.   Exam:    BP 128/60    Pulse 98    Wt 90.9 kg (200 lb 6.4 oz)    SpO2 (!) 83%    BMI 28.75 kg/m  General: NAD Neck: No JVD, no thyromegaly or thyroid nodule.  Lungs: Dry crackles at bases.  CV: Nondisplaced PMI.  Heart regular S1/S2, no S3/S4, no murmur.  No peripheral edema.  No carotid bruit.  Normal pedal pulses.  Abdomen: Soft, nontender, no hepatosplenomegaly, no distention.  Skin: Intact without lesions or rashes.  Neurologic: Alert and oriented x 3.  Psych: Normal affect. Extremities: No clubbing or cyanosis.  HEENT: Normal.   Recent Labs: 02/07/2019: Hemoglobin 15.1; Platelets 216 02/14/2019: ALT 20 03/07/2019: B Natriuretic Peptide 278.9; BUN 33; Creatinine, Ser 1.90; Potassium 4.3; Sodium 141  Personally reviewed   Wt Readings from Last 3 Encounters:  03/07/19 90.9 kg (200 lb 6.4 oz)  02/25/19 90.9 kg (200 lb 6.4 oz)  02/20/19 90.6 kg (199 lb 12.8 oz)      ASSESSMENT AND PLAN:  1. Chronic systolic CHF: Ischemic cardiomyopathy. Medtronic ICD single chamber. 2/19 admission for decompensated HF requiring milrinone. Echo 2/7/019 EF 15% Mildly dilated RV. CPX in 2/19 was suggestive of severe limitation due to HF.   RHC in 4/19 showed normal filling pressures and preserved cardiac output.  Echo in 1/20 with EF 20%.  He had a Barostimulator device placed in 1/20.  RHC in 6/20 showed normal filling pressures, CI 2.29.  NYHA class III symptoms. He is not volume overloaded on exam and weight is down.  I suspect dyspnea at this point may primarily be due to interstitial lung disease.  - Continue Coreg 12.5 mg bid.  - Continue digoxin 0.0625 mg daily, check level today.   - Continue Entresto 49/51 bid.  - Continue spironolactone 25 mg daily.    - Continue Lasix 40 mg  daily.  BMET today. - Has IVCD, not  LBBB-like.  Saw Dr. Caryl Comes, will not upgrade ICD to CRT.  - He may be a future LVAD candidate if lungs stabilize.   2. Afib/ Atrial flutter: Paroxysmal.  Admitted in 2/19 with severe HF decompensation in setting of atrial flutter with RVR.  He was cardioverted.  He then had atrial flutter ablation in 3/19.  He is off amiodarone due to question of amiodarone lung toxicity. No palpitations.  NSR today.  - He is now off amiodarone (question of lung toxicity, would not re-challenge in future).   - Continue Eliquis 5 mg bid. Denies bleeding.  3. CAD: S/p CABG.  Last intervention was PCI to LCx in 2016.  No chest pain.   - Continue atorvastatin, good LDL in 5/20.  - Triglycerides high, now on Vascepa with TGs not normal but improved.  - No ASA with Eliquis use.  4. Pulmonary fibrosis: PFTs in 2/19 were restrictive, and high resolution CT showed interstitial lung disease. Amiodarone was stopped due to concern for possible toxicity.  He has seen [name] => bronchoscopy suggestive of ILD related to amiodarone.  He has been on chronic prednisone.  High resolution CT chest repeated in 6/20, again showing ILD, described at UIP pattern. Dyspnea/fatigue worse for several weeks, seemed to start when prednisone was decreased to 5 mg daily.  He needs to wear oxygen at all times. - Increase prednisone back to 10 mg daily.  - He is awaiting approval for OFEV.   - Continue pulmonary followup.   5. OSA: Continue CPAP.    6. CKD: Stage 3. I will arrange for BMET.  7. Pulmonary hypertension: Suspect group 3 PH due to lung disease (ILD).  8. DM2: He did not tolerate Farxiga   Followup in 3 months.   Signed, Loralie Champagne, MD  03/08/2019  [facility] [address] [facility] and [place] [address] [phone] (office) [phone] (fax)

## 2019-03-11 ENCOUNTER — Ambulatory Visit: Payer: Medicare HMO | Admitting: Sports Medicine

## 2019-03-13 ENCOUNTER — Telehealth: Payer: Self-pay | Admitting: Internal Medicine

## 2019-03-13 NOTE — Telephone Encounter (Signed)
Triage note 03/13/19 states patient brought in financial info and this has been placed in Dr. Golden Pop mailbox.  Will route this message back to pharmacy as last note states they are following this patient application.   Routed to [employer] per 03/07/19 telephone note.

## 2019-03-13 NOTE — Telephone Encounter (Signed)
Documents which were brought in appear to be bank statement deposit verification.  Previous telephone note 2/54/98 regarding application for patient assistance for Ofev. Will update that encounter note as I am unsure who is holding the other portions of the application.  Closing this encounter.

## 2019-03-14 NOTE — Telephone Encounter (Signed)
Looked for patient's income documents, but they were no longer in Md's box. Could you please place them in the pharmacy folder outside the injection office?  Thanks! 4:13 PM Beatriz Chancellor, CPhT

## 2019-03-14 NOTE — Telephone Encounter (Signed)
Someone has located the financials and should be placing them in the pharmacy box.  Vincent Chandler has them. Will close encounter note.

## 2019-03-18 ENCOUNTER — Other Ambulatory Visit (HOSPITAL_COMMUNITY): Payer: Self-pay | Admitting: Cardiology

## 2019-03-18 MED FILL — DIGOXIN 0.125 MG TABLET: 125 | 30 days supply | Qty: 15 | Fill #0

## 2019-03-18 MED FILL — ATORVASTATIN 40 MG TABLET: 40 | 30 days supply | Qty: 30 | Fill #1

## 2019-03-18 MED FILL — predniSONE 10 MG TABS: 10 | 30 days supply | Qty: 30 | Fill #0

## 2019-03-19 NOTE — Telephone Encounter (Signed)
Submitted Patient Assistance Application to [facility] for Ofev. Will follow up to check status.  Fax# [phone] Phone# [phone]  9:09 AM Beatriz Chancellor, CPhT

## 2019-03-21 NOTE — Telephone Encounter (Signed)
Called [employer] to check status of the patient's application. Rep Erasmo Downer advised application was received but that they are unable to verify patient's income with the bank statements he submitted. They are requesting a tax 1040, or 1099, or a statement from social security. Called patient, he will bring 1099 to office for submission.  [id number]#[phone]

## 2019-03-24 ENCOUNTER — Other Ambulatory Visit: Payer: Self-pay | Admitting: Family Medicine

## 2019-03-24 MED FILL — ELIQUIS 5 MG TABLET: 5 | 30 days supply | Qty: 60 | Fill #1

## 2019-03-24 MED FILL — ENTRESTO 49 MG-51 MG TABLET: 49-51 | 30 days supply | Qty: 60 | Fill #2

## 2019-03-25 ENCOUNTER — Encounter (HOSPITAL_COMMUNITY): Payer: Medicare HMO | Admitting: Cardiology

## 2019-03-25 ENCOUNTER — Telehealth: Payer: Self-pay | Admitting: Internal Medicine

## 2019-03-25 MED FILL — metFORMIN HCL 500 MG TABS: 500 | 30 days supply | Qty: 60 | Fill #0

## 2019-03-25 NOTE — Telephone Encounter (Signed)
Patient information noted below has been placed in pharmacy folder for follow up.

## 2019-03-26 NOTE — Telephone Encounter (Signed)
Faxed patient's SS statement to [facility]. Will follow up on status.  Phone# [phone] Fax# [phone]  Beatriz Chancellor, CPhT

## 2019-03-26 NOTE — Telephone Encounter (Signed)
Patient information received and faxed.

## 2019-03-27 NOTE — Telephone Encounter (Signed)
Called and spoke with patient , let him know we did receive and fax information.  Nothing further needed at this time.

## 2019-03-30 DIAGNOSIS — G4733 Obstructive sleep apnea (adult) (pediatric): Secondary | ICD-10-CM | POA: Diagnosis not present

## 2019-04-01 DIAGNOSIS — H25811 Combined forms of age-related cataract, right eye: Secondary | ICD-10-CM | POA: Diagnosis not present

## 2019-04-01 DIAGNOSIS — H25041 Posterior subcapsular polar age-related cataract, right eye: Secondary | ICD-10-CM | POA: Diagnosis not present

## 2019-04-01 DIAGNOSIS — H2511 Age-related nuclear cataract, right eye: Secondary | ICD-10-CM | POA: Diagnosis not present

## 2019-04-02 ENCOUNTER — Telehealth: Payer: Self-pay | Admitting: Internal Medicine

## 2019-04-02 NOTE — Telephone Encounter (Signed)
Received approval for Patient Assistance from [facility] for Ofev. Patient has been approved for assistance through 07/03/2019.  Will send document to scan center.  Phone# [phone] Fax# [phone]

## 2019-04-02 NOTE — Telephone Encounter (Signed)
Left message for patient to call back.

## 2019-04-03 NOTE — Telephone Encounter (Signed)
Spoke with pt. He is wanting to get a POC with [employer]. Pt will need a qualifying walk and OV. He has been scheduled with Beth on 04/07/2019 at 1330. Nothing further was needed.

## 2019-04-04 ENCOUNTER — Other Ambulatory Visit: Payer: Medicare HMO

## 2019-04-05 DIAGNOSIS — I2510 Atherosclerotic heart disease of native coronary artery without angina pectoris: Secondary | ICD-10-CM | POA: Diagnosis not present

## 2019-04-05 DIAGNOSIS — I509 Heart failure, unspecified: Secondary | ICD-10-CM | POA: Diagnosis not present

## 2019-04-05 DIAGNOSIS — J849 Interstitial pulmonary disease, unspecified: Secondary | ICD-10-CM | POA: Diagnosis not present

## 2019-04-05 DIAGNOSIS — J45909 Unspecified asthma, uncomplicated: Secondary | ICD-10-CM | POA: Diagnosis not present

## 2019-04-07 ENCOUNTER — Ambulatory Visit (INDEPENDENT_AMBULATORY_CARE_PROVIDER_SITE_OTHER): Payer: Medicare HMO | Admitting: Primary Care

## 2019-04-07 ENCOUNTER — Other Ambulatory Visit: Payer: Self-pay

## 2019-04-07 ENCOUNTER — Encounter: Payer: Self-pay | Admitting: Primary Care

## 2019-04-07 ENCOUNTER — Telehealth: Payer: Self-pay

## 2019-04-07 ENCOUNTER — Telehealth: Payer: Self-pay | Admitting: Primary Care

## 2019-04-07 DIAGNOSIS — G4733 Obstructive sleep apnea (adult) (pediatric): Secondary | ICD-10-CM | POA: Diagnosis not present

## 2019-04-07 DIAGNOSIS — J9611 Chronic respiratory failure with hypoxia: Secondary | ICD-10-CM | POA: Diagnosis not present

## 2019-04-07 DIAGNOSIS — J849 Interstitial pulmonary disease, unspecified: Secondary | ICD-10-CM

## 2019-04-07 DIAGNOSIS — Z23 Encounter for immunization: Secondary | ICD-10-CM | POA: Diagnosis not present

## 2019-04-07 NOTE — Telephone Encounter (Signed)
Patient was seen in the office today and wanted to check status of his financial assistance for OFEV. Please advise  Yes he stated his temporary residence is [address] [place], [place]  [place]

## 2019-04-07 NOTE — Addendum Note (Signed)
Addended by: Jannette Spanner on: 04/07/2019 04:21 PM   Modules accepted: Orders

## 2019-04-07 NOTE — Patient Instructions (Addendum)
Pleasure meeting you today  Interstitial lung disease: - Start OFEV 132m twice daily (please see if medication was delivered to your old address) - Labs in 4-6 weeks at next visit   Oxygen: - Please use ____3L on exertion to keep O2 >88-90%______  Orders: - Qualified for portable oxygen concentrator/ [employer]  - Please find our where OFEV is being sent, need to update address (if possible have sent here and we can contact patient to pick up)  Follow-up: 6 weeks with Dr. RChase Caller(needs labs at next visit once started new medication) Received flu shot yoday    Pulmonary Fibrosis  Pulmonary fibrosis is a type of lung disease that causes scarring. Over time, the scar tissue builds up in the air sacs of your lungs (alveoli). This makes it hard for you to breathe. Less oxygen can get into your blood. Scarring from pulmonary fibrosis gets worse over time. This damage is permanent and may lead to other serious health problems. What are the causes? There are many different causes of pulmonary fibrosis. Sometimes the cause is not known. This is called idiopathic pulmonary fibrosis. Other causes include:  Exposure to chemicals and substances found in agricultural, farm, cArchitect or factory work. These include mold, asbestos, silica, metal dusts, and toxic fumes.  Sarcoidosis. In this disease, areas of inflammatory cells (granulomas) form and most often affect the lungs.  Autoimmune diseases. These include diseases such as rheumatoid arthritis, systemic sclerosis, or connective tissue disease.  Taking certain medicines. These include drugs used in radiation therapy or used to treat seizures, heart problems, and some infections. What increases the risk? You are more likely to develop this condition if:  You have a family history of the disease.  You are older. The condition is more common in older adults.  You have a history of smoking.  You have a job that exposes you to certain  chemicals.  You have gastroesophageal reflux disease (GERD). What are the signs or symptoms? Symptoms of this condition include:  Difficulty breathing that gets worse with activity.  Shortness of breath (dyspnea).  Dry, hacking cough.  Rapid, shallow breathing during exercise or while at rest.  Bluish skin and lips.  Loss of appetite.  Weakness.  Weight loss and fatigue.  Rounded and enlarged fingertips (clubbing). How is this diagnosed? This condition may be diagnosed based on:  Your symptoms and medical history.  A physical exam. You may also have tests, including:  A test that involves looking inside your lungs with an instrument (bronchoscopy).  Imaging studies of your lungs and heart.  Tests to measure how well you are breathing (pulmonary function tests).  Blood tests.  Tests to see how well your lungs work while you are walking (pulmonary stress test).  A procedure to remove a lung tissue sample to look at it under a microscope (biopsy). How is this treated? There is no cure for pulmonary fibrosis. Treatment focuses on managing symptoms and preventing scarring from getting worse. This may include:  Medicines, such as: ? Steroids to prevent permanent lung changes. ? Medicines to suppress your body's defense system (immune system). ? Medicines to help with lung function by reducing inflammation or scarring.  Ongoing monitoring with X-rays and lab work.  Oxygen therapy.  Pulmonary rehabilitation.  Surgery. In some cases, a lung transplant is possible. Follow these instructions at home:     Medicines  Take over-the-counter and prescription medicines only as told by your health care provider.  Keep your vaccinations up  to date as recommended by your health care provider. General instructions  Do not use any products that contain nicotine or tobacco, such as cigarettes and e-cigarettes. If you need help quitting, ask your health care  provider.  Get regular exercise, but do not overexert yourself. Ask your health care provider to suggest some activities that are safe for you to do. ? If you have physical limitations, you may get exercise by walking, using a stationary bike, or doing chair exercises. ? Ask your health care provider about using oxygen while exercising.  If you are exposed to chemicals and substances at work, make sure that you wear a mask or respirator at all times.  Join a pulmonary rehabilitation program or a support group for people with pulmonary fibrosis.  Eat small meals often so you do not get too full. Overeating can make breathing trouble worse.  Maintain a healthy weight. Lose weight if you need to.  Do breathing exercises as directed by your health care provider.  Keep all follow-up visits as told by your health care provider. This is important. Contact a health care provider if you:  Have symptoms that do not get better with medicines.  Are not able to be as active as usual.  Have trouble taking a deep breath.  Have a fever or chills.  Have blue lips or skin.  Have clubbing of your fingers. Get help right away if you:  Have a sudden worsening of your symptoms.  Have chest pain.  Cough up mucus that is dark in color.  Have a lot of headaches.  Get very confused or sleepy. Summary  Pulmonary fibrosis is a type of lung disease that causes scar tissue to build up in the air sacs of your lungs (alveoli) over time. Less oxygen can get into your blood. This makes it hard for you to breathe.  Scarring from pulmonary fibrosis gets worse over time. This damage is permanent and may lead to other serious health problems.  You are more likely to develop this condition if you have a family history of the condition or a job that exposes you to certain chemicals.  There is no cure for pulmonary fibrosis. Treatment focuses on managing symptoms and preventing scarring from getting  worse. This information is not intended to replace advice given to you by your health care provider. Make sure you discuss any questions you have with your health care provider. Document Released: 09/09/2003 Document Revised: 07/25/2017 Document Reviewed: 07/25/2017 Elsevier Patient Education  2020 [employer].

## 2019-04-07 NOTE — Telephone Encounter (Signed)
Per office visit note patient was walked on a pulsed portable oxygen concentrator and O2 sats were sufficient with exertion on 3lpm pulsed dose.  The order placed was to provide patient with a portable oxygen concentrator.    Please advise if this clarification will suffice, or if a new order needs to be placed for a POC to be dispensed to patient.  Thanks.

## 2019-04-07 NOTE — Assessment & Plan Note (Signed)
-  Awaiting antifibrotic medication delivery (need to update current address) - Plan start OFEV 135m twice daily - Needs LFTS and BMET at next visit - FU in 6 weeks with Dr. RChase Caller

## 2019-04-07 NOTE — Assessment & Plan Note (Signed)
-  Ambulatory O2 sat low 84% RA - Needs 3L pulsed oxygen to keep O2 >88-90% - Qualified for [employer] portable oxygen concentrator

## 2019-04-07 NOTE — Assessment & Plan Note (Addendum)
-  Encourage enhanced compliance with CPAP use - Pressure 5-15cm h20; AHI 3.2 - Refer to [facility] sleep medicine

## 2019-04-07 NOTE — Progress Notes (Signed)
_0  ID: Vincent Chandler, male    DOB: 04/02/1961, 58 y.o.   MRN: [id number]  Chief Complaint  Patient presents with  . Follow-up    Referring provider: Ann Held, *  HPI: 58 year old male, never smoked.  Past medical history significant for interstitial lung disease, obstructive sleep apnea, congestive heart failure, hypertension, and NSTEMI, A. Fib (hx amiodarone use), chronic periodontitis, type 2 diabetes, stage 3 chronic kidney disease, hyperlipemia. Patient of [name], last seen on 02/20/19. 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 June 2020 found to have new exertional dyspnea. Dr [name] did cath - PCWP 12 and lasix reduced (Creat 2 with GFR 50s now. Home sleep test showed severe sleep apnea in June 2020, started on CPAP auto titrate 5-15cm h20. Maintained on Dulera, albuterol as needed. Pulmonary fibrosis felt to have progressed despite stopping amiodarone and prednisone therapy. Plan reduce prednisone to 54m daily, started on OFEV 1017mtwice daily. Needs LFTS and BMET at follow-up  04/07/2019 Patient presents today for office visit to qualify for POC/Inogen. He has not received OFEV medication. Breathing is ok, baseline. He has difficulty with shortness of breath without oxygen. Associated weakness. No change in cough, mainly dry. States Dr. McAundra Dubinncreased his prednisone back up to 1033maily. Right flank pain and rash started 3 days ago. He has an apt with PCP in 2 days. Living with friend, not currently at old address. Needs LFTs and BMET after starting OFEV.   Airview download 02/12/19-03/13/19 - Usage 12/30 days (40%); 9/30 (30%) > 4 hours - Average usage 5 hours 13 mins - Pressure 5-15cm h20 (10.4cm h20 95% percentile) - Moderate air leaks - AHI 3.2  Significant testing reviewed: ECHO TEE 08/20/17 - EF 15%  (CPST 08/24/17 - high risk CHF features with Vo2 max 12 and also desaturation  < 90%)  HRCT IMPRESSION 09/17/17- The appearance of the lungs is compatible  with interstitial lung disease. The pattern is considered a probable usual interstitial pneumonia (UIP) CT pattern, however, at this time, no definitive honeycombing is identified  HRCT IMPRESSION 12/27/18- I mages demonstrate widespread areas of ground-glass attenuation, septal thickening, subpleural reticulation, thickening of the peribronchovascular interstitium, mild cylindrical traction bronchiectasis and regional areas of architectural distortion. No frank honeycombing is confidently identified at this time. Findings have a definitive craniocaudal gradient and appear minimally progressive compared to the prior study. Findings in the lungs remain compatible with interstitial lung disease, with a pattern considered probable usual interstitial pneumonia (UIP) per current ATS guidelines.  No Known Allergies  Immunization History  Administered Date(s) Administered  . Influenza Inj Mdck Quad Pf 06/14/2018  . Influenza Split 06/05/2012  . Influenza Whole 05/09/2010  . Influenza,inj,Quad PF,6+ Mos 04/09/2013, 06/05/2014, 08/17/2017  . Influenza-Unspecified 04/03/2015  . Pneumococcal Conjugate-13 06/05/2014  . Pneumococcal Polysaccharide-23 02/17/2012  . Td 05/09/2010, 02/07/2019    Past Medical History:  Diagnosis Date  . AICD (automatic cardioverter/defibrillator) present   . Anginal pain ([place] . Anxiety   . Asthma   . Atrial fibrillation-postoperative 11/28/2012  . Cardiomyopathy    alcohol use related  . CHF (congestive heart failure) ([facility] . Chronic back pain   . Coronary artery disease    drug eluting stent RCA 2005-EF 30%- s/p CABG x 4; 2/4 patent grafts with SVG-PLOM and SVG-RCA system totally occluded. There are collaterals from the LAD system to the RCA and the RCA is totally occluded proximally. There are no collaterals to the LCx territory. He then underwent  successful PCI of the mid left circumflex artery with overlapping Synergy drug-eluting stents  . Depression    pt  denies  . Diabetes mellitus type II dx'd in the 1990's  . GERD (gastroesophageal reflux disease)    yrs ago  . H/O hiatal hernia   . Hemorrhoids   . History of hypogonadism   . History of kidney stones   . Hyperlipidemia   . Hypertension   . OSA on CPAP    "mask is broken; working on getting a new one" (01/15/2015; 10/03/2017)  . Pneumonia    "3-4 times" (10/03/2017)  . Urinary incontinence     Tobacco History: Social History   Tobacco Use  Smoking Status Never Smoker  Smokeless Tobacco Current User  . Types: Chew   Ready to quit: Not Answered Counseling given: Not Answered   Outpatient Medications Prior to Visit  Medication Sig Dispense Refill  . ACCU-CHEK FASTCLIX LANCETS MISC Use as directed once a day.  Dx code: E11.9 100 each 1  . albuterol (ACCUNEB) 0.63 MG/3ML nebulizer solution Take 3 mLs (0.63 mg total) by nebulization every 6 (six) hours as needed for wheezing. 75 mL 12  . albuterol (PROAIR HFA) 108 (90 Base) MCG/ACT inhaler Inhale 1-2 puffs into the lungs every 6 (six) hours as needed for wheezing or shortness of breath. (Patient taking differently: Inhale 2 puffs into the lungs every 6 (six) hours as needed for wheezing or shortness of breath. ) 1 Inhaler 3  . atorvastatin (LIPITOR) 40 MG tablet TAKE 1 TABLET (40 MG TOTAL) BY MOUTH AT BEDTIME. 90 tablet 3  . blood glucose meter kit and supplies Dispense based on patient and insurance preference. Use as directed once a day. Dx Code E11.9. 1 each 0  . Blood Glucose Monitoring Suppl (ACCU-CHEK GUIDE) w/Device KIT 1 each by Does not apply route daily. DX Code: E11.9 1 kit 0  . carvedilol (COREG) 12.5 MG tablet Take 2 tablets (25 mg total) by mouth 2 (two) times daily with a meal. (Patient taking differently: Take 12.5 mg by mouth 2 (two) times daily with a meal. ) 180 tablet 3  . digoxin (LANOXIN) 0.125 MG tablet TAKE 1/2 TABLET BY MOUTH DAILY. 15 tablet 2  . ELIQUIS 5 MG TABS tablet TAKE 1 TABLET BY MOUTH 2 TIMES DAILY.  (Patient taking differently: Take 5 mg by mouth 2 (two) times daily. ) 60 tablet 5  . ENTRESTO 49-51 MG TAKE 1 TABLET BY MOUTH 2 TIMES DAILY. 60 tablet 2  . fenofibrate 160 MG tablet Take 1 tablet (160 mg total) by mouth daily. 30 tablet 2  . furosemide (LASIX) 20 MG tablet Take 2 tablets (40 mg total) by mouth daily. 30 tablet 11  . glucose blood (ACCU-CHEK GUIDE) test strip Use as directed once a day.  Dx code: E11.9 100 each 1  . guaiFENesin (MUCINEX) 600 MG 12 hr tablet Take 1 tablet (600 mg total) by mouth 2 (two) times daily as needed for to loosen phlegm.    . insulin degludec (TRESIBA FLEXTOUCH) 100 UNIT/ML SOPN FlexTouch Pen 10 u Sq daily 3 mL 3  . metFORMIN (GLUCOPHAGE) 500 MG tablet TAKE 1 TABLET BY MOUTH 2 TIMES DAILY WITH A MEAL. 60 tablet 0  . mometasone-formoterol (DULERA) 200-5 MCG/ACT AERO Inhale 2 puffs into the lungs 2 (two) times a day. 2 Inhaler 0  . nitroGLYCERIN (NITROSTAT) 0.4 MG SL tablet Place 1 tablet (0.4 mg total) under the tongue every 5 (five) minutes as needed  for chest pain. 25 tablet 3  . Polyethyl Glycol-Propyl Glycol (SYSTANE) 0.4-0.3 % SOLN Place 1 drop into both eyes daily.    . predniSONE (DELTASONE) 10 MG tablet Take 1 tablet (10 mg total) by mouth daily with breakfast. 30 tablet 6  . silver sulfADIAZINE (SILVADENE) 1 % cream Apply 1 application topically daily. 50 g 0  . spironolactone (ALDACTONE) 25 MG tablet Take 1 tablet (25 mg total) by mouth daily. 90 tablet 3   No facility-administered medications prior to visit.    Review of Systems  Review of Systems  Constitutional: Negative.   Respiratory: Positive for shortness of breath. Negative for wheezing.   Cardiovascular: Negative.   Neurological: Positive for weakness.   Physical Exam  BP 102/64 (BP Location: Left Arm, Cuff Size: Normal)   Pulse 91   Ht _0  (1.778 m)   Wt 203 lb 8 oz (92.3 kg)   SpO2 (!) 84%   BMI 29.20 kg/m  Physical Exam Constitutional:      General: He is not in  acute distress.    Appearance: Normal appearance. He is not ill-appearing.  HENT:     Head: Normocephalic and atraumatic.  Neck:     Musculoskeletal: Normal range of motion and neck supple.  Cardiovascular:     Rate and Rhythm: Normal rate and regular rhythm.  Pulmonary:     Effort: Pulmonary effort is normal.     Breath sounds: No wheezing or rhonchi.     Comments: Fine crackles at bilateral bases Skin:    General: Skin is warm and dry.  Neurological:     General: No focal deficit present.     Mental Status: He is alert and oriented to person, place, and time. Mental status is at baseline.  Psychiatric:        Mood and Affect: Mood normal.        Behavior: Behavior normal.        Thought Content: Thought content normal.        Judgment: Judgment normal.      Lab Results:  CBC    Component Value Date/Time   WBC 9.1 02/07/2019 1606   RBC 4.12 (L) 02/07/2019 1606   HGB 15.1 02/07/2019 1606   HGB 14.3 08/21/2018 1500   HCT 44.0 02/07/2019 1606   HCT 41.0 08/21/2018 1500   PLT 216 02/07/2019 1606   PLT 204 08/21/2018 1500   MCV 106.8 (H) 02/07/2019 1606   MCV 110 (H) 08/21/2018 1500   MCH 36.7 (H) 02/07/2019 1606   MCHC 34.3 02/07/2019 1606   RDW 13.2 02/07/2019 1606   RDW 13.8 08/21/2018 1500   LYMPHSABS 1,784 02/07/2019 1606   LYMPHSABS 1.4 07/10/2018 1700   MONOABS 0.4 06/04/2018 1602   EOSABS 109 02/07/2019 1606   EOSABS 0.2 07/10/2018 1700   BASOSABS 55 02/07/2019 1606   BASOSABS 0.0 07/10/2018 1700    BMET    Component Value Date/Time   NA 141 03/07/2019 1056   NA 138 07/10/2018 1700   K 4.3 03/07/2019 1056   CL 109 03/07/2019 1056   CO2 22 03/07/2019 1056   GLUCOSE 109 (H) 03/07/2019 1056   BUN 33 (H) 03/07/2019 1056   BUN 30 (H) 07/10/2018 1700   CREATININE 1.90 (H) 03/07/2019 1056   CREATININE 2.30 (H) 02/07/2019 1606   CALCIUM 9.2 03/07/2019 1056   GFRNONAA 38 (L) 03/07/2019 1056   GFRAA 44 (L) 03/07/2019 1056    BNP    Component Value  Date/Time   BNP 278.9 (H) 03/07/2019 1056   BNP 78.8 07/14/2015 1547    ProBNP    Component Value Date/Time   PROBNP 731 (H) 07/12/2016 1603   PROBNP 165.0 (H) 06/16/2013 1111    Imaging: No results found.   Assessment & Plan:   ILD (interstitial lung disease) ([place]) - Awaiting antifibrotic medication delivery (need to update current address) - Plan start OFEV 144m twice daily - Needs LFTS and BMET at next visit - FU in 6 weeks with Dr. RChase Caller  Chronic respiratory failure with hypoxia ([place] - Ambulatory O2 sat low 84% RA - Needs 3L pulsed oxygen to keep O2 >88-90% - Qualified for INOGEN portable oxygen concentrator   OSA (obstructive sleep apnea) - Continue CPAP therapy - Requesting download for review      EMartyn Ehrich NP 04/07/2019

## 2019-04-07 NOTE — Telephone Encounter (Signed)
Patient was approved for Ofev patient assistance on 04/02/19. Called patient to see if he has not received 1st Ofev shipment. Patient advises that he has not been staying at his residence, but with a friend. He will go check his mail tomorrow and call office if it has not been delivered.  3:18 PM Beatriz Chancellor, CPhT

## 2019-04-07 NOTE — Telephone Encounter (Signed)
Vanessa Ralphs  [place], [place], [facility],   This patient has been on Continuous O2 with [place] since 12/2018... at 3 LPM.   Is there something that is needing changed?

## 2019-04-08 ENCOUNTER — Ambulatory Visit (INDEPENDENT_AMBULATORY_CARE_PROVIDER_SITE_OTHER): Payer: Medicare HMO | Admitting: Sports Medicine

## 2019-04-08 ENCOUNTER — Other Ambulatory Visit: Payer: Self-pay

## 2019-04-08 ENCOUNTER — Encounter: Payer: Self-pay | Admitting: Sports Medicine

## 2019-04-08 DIAGNOSIS — L03119 Cellulitis of unspecified part of limb: Secondary | ICD-10-CM

## 2019-04-08 DIAGNOSIS — M79671 Pain in right foot: Secondary | ICD-10-CM | POA: Diagnosis not present

## 2019-04-08 DIAGNOSIS — E1165 Type 2 diabetes mellitus with hyperglycemia: Secondary | ICD-10-CM | POA: Diagnosis not present

## 2019-04-08 DIAGNOSIS — L02619 Cutaneous abscess of unspecified foot: Secondary | ICD-10-CM | POA: Diagnosis not present

## 2019-04-08 DIAGNOSIS — L97511 Non-pressure chronic ulcer of other part of right foot limited to breakdown of skin: Secondary | ICD-10-CM

## 2019-04-08 NOTE — Progress Notes (Signed)
Subjective: Vincent Chandler is a 58 y.o. male patient seen in office for follow up evaluation of ulceration of the right plantar forefoot.  Patient reports that it is doing better but still has been wrapping, no pain, no swelling, no drainage, no redness, warmth, swelling. FBS today 130. No other issues noted.   Patient Active Problem List   Diagnosis Date Noted  . Chronic respiratory failure with hypoxia ([place]) 04/07/2019  . Type 2 diabetes mellitus with stage 3 chronic kidney disease, without long-term current use of insulin ([place]) 02/26/2019  . Uncontrolled type 2 diabetes mellitus with hyperglycemia ([place]) 02/11/2019  . ILD (interstitial lung disease) ([place])   . Chronic periodontitis 08/30/2017  . Atrial flutter ([place]) 08/08/2017  . Dyspnea 08/07/2017  . Hyperglycemia 08/07/2017  . CKD (chronic kidney disease) 2-3 07/19/2014  . NSTEMI (non-ST elevated myocardial infarction) ([place]) 07/18/2014  . OSA (obstructive sleep apnea) 07/18/2014  . Tachycardia   . V tach ([place]) 07/17/2014  . Hypoxemia 07/17/2014  . CHF (congestive heart failure) ([place]) 06/04/2012  . Sinus tachycardia 12/29/2010  . Coronary artery disease prior RCA stent with new inferior Q waves 10/18/2010  . Ischemic and nonischemic cardiomyopathy   10/18/2010  . Uncontrolled type 2 diabetes mellitus without complication, without long-term current use of insulin 03/10/2010  . Hyperlipidemia 03/10/2010  . Essential hypertension 03/10/2010   Current Outpatient Medications on File Prior to Visit  Medication Sig Dispense Refill  . ACCU-CHEK FASTCLIX LANCETS MISC Use as directed once a day.  Dx code: E11.9 100 each 1  . albuterol (ACCUNEB) 0.63 MG/3ML nebulizer solution Take 3 mLs (0.63 mg total) by nebulization every 6 (six) hours as needed for wheezing. 75 mL 12  . albuterol (PROAIR HFA) 108 (90 Base) MCG/ACT inhaler Inhale 1-2 puffs into the lungs every 6 (six) hours as needed for wheezing or shortness of breath. (Patient taking  differently: Inhale 2 puffs into the lungs every 6 (six) hours as needed for wheezing or shortness of breath. ) 1 Inhaler 3  . atorvastatin (LIPITOR) 40 MG tablet TAKE 1 TABLET (40 MG TOTAL) BY MOUTH AT BEDTIME. 90 tablet 3  . blood glucose meter kit and supplies Dispense based on patient and insurance preference. Use as directed once a day. Dx Code E11.9. 1 each 0  . Blood Glucose Monitoring Suppl (ACCU-CHEK GUIDE) w/Device KIT 1 each by Does not apply route daily. DX Code: E11.9 1 kit 0  . carvedilol (COREG) 12.5 MG tablet Take 2 tablets (25 mg total) by mouth 2 (two) times daily with a meal. (Patient taking differently: Take 12.5 mg by mouth 2 (two) times daily with a meal. ) 180 tablet 3  . digoxin (LANOXIN) 0.125 MG tablet TAKE 1/2 TABLET BY MOUTH DAILY. 15 tablet 2  . ELIQUIS 5 MG TABS tablet TAKE 1 TABLET BY MOUTH 2 TIMES DAILY. (Patient taking differently: Take 5 mg by mouth 2 (two) times daily. ) 60 tablet 5  . ENTRESTO 49-51 MG TAKE 1 TABLET BY MOUTH 2 TIMES DAILY. 60 tablet 2  . fenofibrate 160 MG tablet Take 1 tablet (160 mg total) by mouth daily. 30 tablet 2  . furosemide (LASIX) 20 MG tablet Take 2 tablets (40 mg total) by mouth daily. 30 tablet 11  . glucose blood (ACCU-CHEK GUIDE) test strip Use as directed once a day.  Dx code: E11.9 100 each 1  . guaiFENesin (MUCINEX) 600 MG 12 hr tablet Take 1 tablet (600 mg total) by mouth 2 (two) times daily  as needed for to loosen phlegm.    . insulin degludec (TRESIBA FLEXTOUCH) 100 UNIT/ML SOPN FlexTouch Pen 10 u Sq daily 3 mL 3  . metFORMIN (GLUCOPHAGE) 500 MG tablet TAKE 1 TABLET BY MOUTH 2 TIMES DAILY WITH A MEAL. 60 tablet 0  . mometasone-formoterol (DULERA) 200-5 MCG/ACT AERO Inhale 2 puffs into the lungs 2 (two) times a day. 2 Inhaler 0  . nitroGLYCERIN (NITROSTAT) 0.4 MG SL tablet Place 1 tablet (0.4 mg total) under the tongue every 5 (five) minutes as needed for chest pain. 25 tablet 3  . Polyethyl Glycol-Propyl Glycol (SYSTANE)  0.4-0.3 % SOLN Place 1 drop into both eyes daily.    . predniSONE (DELTASONE) 10 MG tablet Take 1 tablet (10 mg total) by mouth daily with breakfast. 30 tablet 6  . silver sulfADIAZINE (SILVADENE) 1 % cream Apply 1 application topically daily. 50 g 0  . spironolactone (ALDACTONE) 25 MG tablet Take 1 tablet (25 mg total) by mouth daily. 90 tablet 3   No current facility-administered medications on file prior to visit.    No Known Allergies  Recent Results (from the past 2160 hour(s))  SARS Coronavirus 2 (Performed in [facility] hospital lab)     Status: None   Collection Time: 01/13/19  1:05 PM   Specimen: Nasal Swab  Result Value Ref Range   SARS Coronavirus 2 NEGATIVE NEGATIVE    Comment: (NOTE) SARS-CoV-2 target nucleic acids are NOT DETECTED. The SARS-CoV-2 RNA is generally detectable in upper and lower respiratory specimens during the acute phase of infection. Negative results do not preclude SARS-CoV-2 infection, do not rule out co-infections with other pathogens, and should not be used as the sole basis for treatment or other patient management decisions. Negative results must be combined with clinical observations, patient history, and epidemiological information. The expected result is Negative. Fact Sheet for Patients: SugarRoll.be Fact Sheet for Healthcare Providers: https://www.woods-mathews.com/ This test is not yet approved or cleared by the [place] FDA and  has been authorized for detection and/or diagnosis of SARS-CoV-2 by FDA under an Emergency Use Authorization (EUA). This EUA will remain  in effect (meaning this test can be used) for the duration of the COVID-19 declaration under Section 56 4(b)(1) of the Act, 21 U.S.C. section 360bbb-3(b)(1), unless the authorization is terminated or revoked sooner. Performed at [facility], [place] [address], [place], [place] [address]   Pulmonary function test     Status:  None (Preliminary result)   Collection Time: 01/16/19 11:59 AM  Result Value Ref Range   FVC-Pre 2.58 L   FVC-%Pred-Pre 52 %   FVC-Post 2.59 L   FVC-%Pred-Post 53 %   FVC-%Change-Post 0 %   FEV1-Pre 2.16 L   FEV1-%Pred-Pre 58 %   FEV1-Post 2.16 L   FEV1-%Pred-Post 58 %   FEV1-%Change-Post 0 %   FEV6-Pre 2.58 L   FEV6-%Pred-Pre 55 %   FEV6-Post 2.59 L   FEV6-%Pred-Post 55 %   FEV6-%Change-Post 0 %   Pre FEV1/FVC ratio 84 %   FEV1FVC-%Pred-Pre 110 %   Post FEV1/FVC ratio 83 %   FEV1FVC-%Change-Post 0 %   Pre FEV6/FVC Ratio 100 %   FEV6FVC-%Pred-Pre 104 %   Post FEV6/FVC ratio 100 %   FEV6FVC-%Pred-Post 104 %   FEF 25-75 Pre 2.51 L/sec   FEF2575-%Pred-Pre 80 %   FEF 25-75 Post 2.40 L/sec   FEF2575-%Pred-Post 76 %   FEF2575-%Change-Post -4 %   RV 0.90 L   RV % pred  41 %   TLC 4.00 L   TLC % pred 57 %   DLCO unc 11.18 ml/min/mmHg   DLCO unc % pred 39 %   DL/VA 2.62 ml/min/mmHg/L   DL/VA % pred 61 %  Hemoglobin A1c     Status: Abnormal   Collection Time: 02/07/19  4:06 PM  Result Value Ref Range   Hgb A1c MFr Bld 11.2 (H) <5.7 % of total Hgb    Comment: For someone without known diabetes, a hemoglobin A1c value of 6.5% or greater indicates that they may have  diabetes and this should be confirmed with a follow-up  test. . For someone with known diabetes, a value <7% indicates  that their diabetes is well controlled and a value  greater than or equal to 7% indicates suboptimal  control. A1c targets should be individualized based on  duration of diabetes, age, comorbid conditions, and  other considerations. . Currently, no consensus exists regarding use of hemoglobin A1c for diagnosis of diabetes for children. .    Mean Plasma Glucose 275 (calc)   eAG (mmol/L) 15.2 (calc)  Comprehensive metabolic panel     Status: Abnormal   Collection Time: 02/07/19  4:06 PM  Result Value Ref Range   Glucose, Bld 234 (H) 65 - 99 mg/dL    Comment: .            Fasting  reference interval . For someone without known diabetes, a glucose value >125 mg/dL indicates that they may have diabetes and this should be confirmed with a follow-up test. .    BUN 43 (H) 7 - 25 mg/dL   Creat 2.30 (H) 0.70 - 1.33 mg/dL    Comment: For patients >49 years of age, the reference limit for Creatinine is approximately 13% higher for people identified as African-American. .    BUN/Creatinine Ratio 19 6 - 22 (calc)   Sodium 142 135 - 146 mmol/L   Potassium 4.5 3.5 - 5.3 mmol/L   Chloride 100 98 - 110 mmol/L   CO2 25 20 - 32 mmol/L   Calcium 9.6 8.6 - 10.3 mg/dL   Total Protein 7.3 6.1 - 8.1 g/dL   Albumin 4.2 3.6 - 5.1 g/dL   Globulin 3.1 1.9 - 3.7 g/dL (calc)   AG Ratio 1.4 1.0 - 2.5 (calc)   Total Bilirubin 0.8 0.2 - 1.2 mg/dL   Alkaline phosphatase (APISO) 60 35 - 144 U/L   AST 11 10 - 35 U/L   ALT 17 9 - 46 U/L  CBC with Differential/Platelet     Status: Abnormal   Collection Time: 02/07/19  4:06 PM  Result Value Ref Range   WBC 9.1 3.8 - 10.8 Thousand/uL   RBC 4.12 (L) 4.20 - 5.80 Million/uL   Hemoglobin 15.1 13.2 - 17.1 g/dL   HCT 44.0 38.5 - 50.0 %   MCV 106.8 (H) 80.0 - 100.0 fL   MCH 36.7 (H) 27.0 - 33.0 pg   MCHC 34.3 32.0 - 36.0 g/dL   RDW 13.2 11.0 - 15.0 %   Platelets 216 140 - 400 Thousand/uL   MPV 9.6 7.5 - 12.5 fL   Neutro Abs 6,361 1,500 - 7,800 cells/uL   Lymphs Abs 1,784 850 - 3,900 cells/uL   Absolute Monocytes 792 200 - 950 cells/uL   Eosinophils Absolute 109 15 - 500 cells/uL   Basophils Absolute 55 0 - 200 cells/uL   Neutrophils Relative % 69.9 %   Total Lymphocyte 19.6 %   Monocytes  Relative 8.7 %   Eosinophils Relative 1.2 %   Basophils Relative 0.6 %  Lipid panel     Status: Abnormal   Collection Time: 02/07/19  4:06 PM  Result Value Ref Range   Cholesterol 139 <200 mg/dL   HDL 31 (L) > OR = 40 mg/dL   Triglycerides 476 (H) <150 mg/dL    Comment: . If a non-fasting specimen was collected, consider repeat triglyceride  testing on a fasting specimen if clinically indicated.  Yates Decamp et al. J. of Clin. Lipidol. 4536;4:680-321. Marland Kitchen    LDL Cholesterol (Calc)  mg/dL (calc)    Comment: . LDL cholesterol not calculated. Triglyceride levels greater than 400 mg/dL invalidate calculated LDL results. . Reference range: <100 . Desirable range <100 mg/dL for primary prevention;   <70 mg/dL for patients with CHD or diabetic patients  with > or = 2 CHD risk factors. Marland Kitchen LDL-C is now calculated using the Martin-Hopkins  calculation, which is a validated novel method providing  better accuracy than the Friedewald equation in the  estimation of LDL-C.  Cresenciano Genre et al. Annamaria Helling. 2248;250(03): 2061-2068  (http://education.Quest[employer]ostics.com/faq/FAQ164)    Total CHOL/HDL Ratio 4.5 <5.0 (calc)   Non-HDL Cholesterol (Calc) 108 <130 mg/dL (calc)    Comment: For patients with diabetes plus 1 major ASCVD risk  factor, treating to a non-HDL-C goal of <100 mg/dL  (LDL-C of <70 mg/dL) is considered a therapeutic  option.   POCT glucose (manual entry)     Status: Abnormal   Collection Time: 02/07/19  4:06 PM  Result Value Ref Range   POC Glucose 225 (A) 70 - 99 mg/dl  WOUND CULTURE     Status: None   Collection Time: 02/11/19  4:43 PM   Specimen: Foot, Right; Wound  Result Value Ref Range   MICRO NUMBER: [id number]    SPECIMEN QUALITY: Adequate    SOURCE: NOT GIVEN    STATUS: FINAL    GRAM STAIN:      No white blood cells seen No epithelial cells seen No organisms seen   RESULT:      Growth of skin flora (note: Growth does not include S. aureus, beta-hemolytic Streptococci or P. aeruginosa).  Lipid Profile     Status: Abnormal   Collection Time: 02/14/19 10:04 AM  Result Value Ref Range   Cholesterol 119 0 - 200 mg/dL   Triglycerides 284 (H) <150 mg/dL   HDL 35 (L) >40 mg/dL   Total CHOL/HDL Ratio 3.4 RATIO   VLDL 57 (H) 0 - 40 mg/dL   LDL Cholesterol 27 0 - 99 mg/dL    Comment:        Total Cholesterol/HDL:CHD  Risk Coronary Heart Disease Risk Table                     Men   Women  1/2 Average Risk   3.4   3.3  Average Risk       5.0   4.4  2 X Average Risk   9.6   7.1  3 X Average Risk  23.4   11.0        Use the calculated Patient Ratio above and the CHD Risk Table to determine the patient's CHD Risk.        ATP III CLASSIFICATION (LDL):  <100     mg/dL   Optimal  100-129  mg/dL   Near or Above  Optimal  130-159  mg/dL   Borderline  160-189  mg/dL   High  >190     mg/dL   Very High Performed at [facility] [address], [place], [place] [address]   Hepatic function panel     Status: Abnormal   Collection Time: 02/14/19 10:04 AM  Result Value Ref Range   Total Protein 6.7 6.5 - 8.1 g/dL   Albumin 3.6 3.5 - 5.0 g/dL   AST 16 15 - 41 U/L   ALT 20 0 - 44 U/L   Alkaline Phosphatase 46 38 - 126 U/L   Total Bilirubin 1.2 0.3 - 1.2 mg/dL   Bilirubin, Direct 0.2 0.0 - 0.2 mg/dL   Indirect Bilirubin 1.0 (H) 0.3 - 0.9 mg/dL    Comment: Performed at [facility] [address], [place], [place] [address]  POCT Glucose (CBG)     Status: Abnormal   Collection Time: 02/25/19  2:36 PM  Result Value Ref Range   POC Glucose 130 (A) 70 - 99 mg/dl  Lipid Profile     Status: Abnormal   Collection Time: 03/07/19 10:56 AM  Result Value Ref Range   Cholesterol 105 0 - 200 mg/dL   Triglycerides 205 (H) <150 mg/dL   HDL 30 (L) >40 mg/dL   Total CHOL/HDL Ratio 3.5 RATIO   VLDL 41 (H) 0 - 40 mg/dL   LDL Cholesterol 34 0 - 99 mg/dL    Comment:        Total Cholesterol/HDL:CHD Risk Coronary Heart Disease Risk Table                     Men   Women  1/2 Average Risk   3.4   3.3  Average Risk       5.0   4.4  2 X Average Risk   9.6   7.1  3 X Average Risk  23.4   11.0        Use the calculated Patient Ratio above and the CHD Risk Table to determine the patient's CHD Risk.        ATP III CLASSIFICATION (LDL):  <100     mg/dL   Optimal  100-129  mg/dL   Near or  Above                    Optimal  130-159  mg/dL   Borderline  160-189  mg/dL   High  >190     mg/dL   Very High Performed at [facility] [address] [place], [place] [address]   Basic Metabolic Panel (BMET)     Status: Abnormal   Collection Time: 03/07/19 10:56 AM  Result Value Ref Range   Sodium 141 135 - 145 mmol/L   Potassium 4.3 3.5 - 5.1 mmol/L   Chloride 109 98 - 111 mmol/L   CO2 22 22 - 32 mmol/L   Glucose, Bld 109 (H) 70 - 99 mg/dL   BUN 33 (H) 6 - 20 mg/dL   Creatinine, Ser 1.90 (H) 0.61 - 1.24 mg/dL   Calcium 9.2 8.9 - 10.3 mg/dL   GFR calc non Af Amer 38 (L) >60 mL/min   GFR calc Af Amer 44 (L) >60 mL/min   Anion gap 10 5 - 15    Comment: Performed at [facility] [address] [place], [place] [address]  B Nat Peptide     Status: Abnormal  Collection Time: 03/07/19 10:56 AM  Result Value Ref Range   B Natriuretic Peptide 278.9 (H) 0.0 - 100.0 pg/mL    Comment: Performed at [facility] [address] [place], [place] [address]  Digoxin level     Status: Abnormal   Collection Time: 03/07/19 10:56 AM  Result Value Ref Range   Digoxin Level 0.6 (L) 0.8 - 2.0 ng/mL    Comment: Performed at [facility], [place] [address], [place], [place] [address]    Objective: There were no vitals filed for this visit.  General: Patient is awake, alert, oriented x 3 and in no acute distress.  Dermatology: Skin is warm and dry bilateral with a healed ulceration Plantar fifth metatarsal head on the right foot. No other acute signs of infection.   Vascular: Dorsalis Pedis pulse = 2/4 Bilateral,  Posterior Tibial pulse = 1/4 Bilateral,  Capillary Fill Time < 5 seconds  Neurologic: Protective sensation severely diminished using  the 5.07/10g [employer].  Musculosketal: There is no pain to previous ulcer. No pain with compression to calves bilateral.  Prominent fifth metatarsal heads bilateral with right greater than left.  No  results for input(s): GRAMSTAIN, LABORGA in the last 8760 hours.  Assessment and Plan:  Problem List Items Addressed This Visit      Endocrine   Uncontrolled type 2 diabetes mellitus with hyperglycemia ([place])    Other Visit Diagnoses    Right foot ulcer, limited to breakdown of skin ([place])    -  Primary   Healed   Cellulitis and abscess of foot, except toes       Pain in right foot         -Examined patient and discussed the progression of the healed wound  -Dressings no longer needed -Continue with good supportive shoes -Patient to return to office yearly for diabetic foot exam or sooner if problems arise  Landis Martins, DPM

## 2019-04-08 NOTE — Telephone Encounter (Signed)
This is the staff message I sent Beth yesterday asking for new 02 order. Melvia Heaps, NP        Wells Guiles from [facility] faxed a request for today's office note and walk test. She also needs a new 02 order. I am faxing the notes now.    I'm not sure how [facility] got involved with this message. The 02 order has been faxed to [facility] and I have received confirmation that the fax has been received

## 2019-04-09 ENCOUNTER — Ambulatory Visit (INDEPENDENT_AMBULATORY_CARE_PROVIDER_SITE_OTHER): Payer: Medicare HMO | Admitting: Medical

## 2019-04-09 ENCOUNTER — Encounter: Payer: Self-pay | Admitting: Medical

## 2019-04-09 ENCOUNTER — Ambulatory Visit: Payer: Medicare HMO | Admitting: Medical

## 2019-04-09 ENCOUNTER — Other Ambulatory Visit: Payer: Self-pay

## 2019-04-09 VITALS — BP 90/68 | HR 87 | Temp 96.8°F | Resp 16 | Ht 70.0 in | Wt 201.4 lb

## 2019-04-09 DIAGNOSIS — I959 Hypotension, unspecified: Secondary | ICD-10-CM | POA: Diagnosis not present

## 2019-04-09 DIAGNOSIS — J9611 Chronic respiratory failure with hypoxia: Secondary | ICD-10-CM | POA: Diagnosis not present

## 2019-04-09 DIAGNOSIS — G4733 Obstructive sleep apnea (adult) (pediatric): Secondary | ICD-10-CM | POA: Diagnosis not present

## 2019-04-09 DIAGNOSIS — J849 Interstitial pulmonary disease, unspecified: Secondary | ICD-10-CM | POA: Diagnosis not present

## 2019-04-09 LAB — CBC WITH DIFFERENTIAL/PLATELET
Basophils Absolute: 0 10*3/uL (ref 0.0–0.1)
Basophils Relative: 0.3 % (ref 0.0–3.0)
Eosinophils Absolute: 0.1 10*3/uL (ref 0.0–0.7)
Eosinophils Relative: 1.2 % (ref 0.0–5.0)
HCT: 40.9 % (ref 39.0–52.0)
Hemoglobin: 13.4 g/dL (ref 13.0–17.0)
Lymphocytes Relative: 10.3 % — ABNORMAL LOW (ref 12.0–46.0)
Lymphs Abs: 0.8 10*3/uL (ref 0.7–4.0)
MCHC: 32.7 g/dL (ref 30.0–36.0)
MCV: 111 fl — ABNORMAL HIGH (ref 78.0–100.0)
Monocytes Absolute: 0.7 10*3/uL (ref 0.1–1.0)
Monocytes Relative: 8 % (ref 3.0–12.0)
Neutro Abs: 6.6 10*3/uL (ref 1.4–7.7)
Neutrophils Relative %: 80.2 % — ABNORMAL HIGH (ref 43.0–77.0)
Platelets: 168 10*3/uL (ref 150.0–400.0)
RBC: 3.68 Mil/uL — ABNORMAL LOW (ref 4.22–5.81)
RDW: 15.7 % — ABNORMAL HIGH (ref 11.5–15.5)
WBC: 8.3 10*3/uL (ref 4.0–10.5)

## 2019-04-09 LAB — COMPREHENSIVE METABOLIC PANEL
ALT: 16 U/L (ref 0–53)
AST: 10 U/L (ref 0–37)
Albumin: 3.9 g/dL (ref 3.5–5.2)
Alkaline Phosphatase: 49 U/L (ref 39–117)
BUN: 26 mg/dL — ABNORMAL HIGH (ref 6–23)
CO2: 24 mEq/L (ref 19–32)
Calcium: 9.3 mg/dL (ref 8.4–10.5)
Chloride: 103 mEq/L (ref 96–112)
Creatinine, Ser: 1.79 mg/dL — ABNORMAL HIGH (ref 0.40–1.50)
GFR: 39.24 mL/min — ABNORMAL LOW (ref >60.00)
Glucose, Bld: 231 mg/dL — ABNORMAL HIGH (ref 70–99)
Potassium: 4.4 mEq/L (ref 3.5–5.1)
Sodium: 140 mEq/L (ref 135–145)
Total Bilirubin: 0.8 mg/dL (ref 0.2–1.2)
Total Protein: 6.6 g/dL (ref 6.0–8.3)

## 2019-04-09 MED ORDER — TRAMADOL HCL 50 MG PO TABS: 50.0000 mg | ORAL_TABLET | Freq: Four times a day (QID) | ORAL | 0 refills | Status: AC | PRN

## 2019-04-09 MED ORDER — FAMCICLOVIR 500 MG PO TABS: 500.0000 mg | ORAL_TABLET | Freq: Three times a day (TID) | ORAL | 0 refills | Status: DC

## 2019-04-09 NOTE — Patient Instructions (Addendum)
You do appear to have shingles eruption.  Little late in starting treatment but I do think it will still be beneficial to start Famvir this afternoon.  Also making tramadol available for nerve pain.  Rx advisement given.  Sometimes patients complain of sedation.  Best not to drive tramadol.  You have low blood pressure today and historically it does run low as well.  We will get metabolic panel and CBC.  It is possible that some of your medications for CHF causing mild dehydration.  I want you to check your blood pressure tonight and tomorrow morning.  Update [place] on blood pressure readings.  Might need to contact your cardiologist for dose changes on diuretic.  Follow-up in 7 days or as needed.

## 2019-04-09 NOTE — Telephone Encounter (Signed)
Order has been placed to [facility] with confirmation per Rodena Piety. Nothing further needed at this time.

## 2019-04-09 NOTE — Progress Notes (Signed)
Subjective:    Patient ID: Vincent Chandler , male    DOB: Apr 22, 1961, 58 y.o.   MRN: [id number]  HPI  Patient here for rash.  About 4 days ago, patient noticed his back was achy. When he got out of the shower he noticed some red splotches on his back in the mirror. Within the next 2 days he noticed the rash spreading around his trunk towards his abdomen. He is not yet put anything on it or taken anything for pain. Pain is 3-7/10 burning, stinging, shocking pain. Since then he has had fatigue, general body aches, and chills. No fevers.   Pt has history of oxygen use for 2 months ago. Pt has pulmonologist. Pt has interstitial lung disease. Pt has hx of chf. Had used amiodorone and states got ILD. Pt has new medication that pulmonologist ordered. He should start soon.Being mailed to pt or his pulmnologist.  No fever and no productive cough. No change to his chronic cough.   Pain with rash keeping him up.   Pt bp is little on low side compared to his baseline. Monday his systolic was 009. Today is lower. No black or blood stool. No abdomen pain. Pt is on lasix, spirinolactone and coreg.     Review of Systems  Constitutional: Positive for chills and fatigue. Negative for fever.       1st day when he got rash.  Respiratory: Negative for chest tightness and shortness of breath.   Cardiovascular: Negative for chest pain and palpitations.  Musculoskeletal: Positive for back pain.       Generalized body aches since rash developed  Skin: Positive for rash.       Red, blistery rash starting on right back next to spine spreading around trunk to abdomen  Neurological: Negative for headaches.  Psychiatric/Behavioral: Negative for behavioral problems. The patient is not nervous/anxious.        Trouble sleeping due to discomfort from rash   Past Medical History:  Diagnosis Date  . AICD (automatic cardioverter/defibrillator) present   . Anginal pain ([place])   . Anxiety   . Asthma   . Atrial  fibrillation-postoperative 11/28/2012  . Cardiomyopathy    alcohol use related  . CHF (congestive heart failure) ([place])   . Chronic back pain   . Coronary artery disease    drug eluting stent RCA 2005-EF 30%- s/p CABG x 4; 2/4 patent grafts with SVG-PLOM and SVG-RCA system totally occluded. There are collaterals from the LAD system to the RCA and the RCA is totally occluded proximally. There are no collaterals to the LCx territory. He then underwent successful PCI of the mid left circumflex artery with overlapping Synergy drug-eluting stents  . Depression    pt denies  . Diabetes mellitus type II dx'd in the 1990's  . GERD (gastroesophageal reflux disease)    yrs ago  . H/O hiatal hernia   . Hemorrhoids   . History of hypogonadism   . History of kidney stones   . Hyperlipidemia   . Hypertension   . OSA on CPAP    "mask is broken; working on getting a new one" (01/15/2015; 10/03/2017)  . Pneumonia    "3-4 times" (10/03/2017)  . Urinary incontinence      Social History   Socioeconomic History  . Marital status: Divorced    Spouse name: n/a  . Number of children: 0  . Years of education: 12th grade  . Highest education level: Not on file  Occupational History  .  Occupation: Data processing manager ---self employed    Employer: [employer]  Social Needs  . Financial resource strain: Not on file  . Food insecurity    Worry: Not on file    Inability: Not on file  . Transportation needs    Medical: Not on file    Non-medical: Not on file  Tobacco Use  . Smoking status: Never Smoker  . Smokeless tobacco: Current User    Types: Chew  Substance and Sexual Activity  . Alcohol use: Yes    Alcohol/week: 3.0 standard drinks    Types: 3 Glasses of wine per week  . Drug use: No  . Sexual activity: Not Currently    Partners: Female  Lifestyle  . Physical activity    Days per week: Not on file    Minutes per session: Not on file  . Stress: Not on file   Relationships  . Social Herbalist on phone: Not on file    Gets together: Not on file    Attends religious service: Not on file    Active member of club or organization: Not on file    Attends meetings of clubs or organizations: Not on file    Relationship status: Not on file  . Intimate partner violence    Fear of current or ex partner: Not on file    Emotionally abused: Not on file    Physically abused: Not on file    Forced sexual activity: Not on file  Other Topics Concern  . Not on file  Social History Narrative   Exercise-- walking    Lives alone.   Brother lives in [place], [place]    Past Surgical History:  Procedure Laterality Date  . A-FLUTTER ABLATION N/A 09/12/2017   Procedure: A-FLUTTER ABLATION;  Surgeon: Deboraha Sprang, MD;  Location: [facility];  Service: Cardiovascular;  Laterality: N/A;  . CARDIAC DEFIBRILLATOR PLACEMENT  01/15/2015  . CARDIOVERSION N/A 08/10/2017   Procedure: CARDIOVERSION;  Surgeon: Larey Dresser, MD;  Location: [facility] ENDOSCOPY;  Service: Cardiovascular;  Laterality: N/A;  . CORONARY ANGIOPLASTY WITH STENT PLACEMENT  ~ 2003  . CORONARY ARTERY BYPASS GRAFT N/A 08/15/2012   Procedure: CORONARY ARTERY BYPASS GRAFTING (CABG);  Surgeon: Ivin Poot, MD;  Location: [address];  Service: Open Heart Surgery;  Laterality: N/A;  Coronary Artery Bypass Grafting Times Four Using Left Internal Mammary Artery and Right Saphenous Leg Vein Harvested Endoscopically  . EP IMPLANTABLE DEVICE N/A 01/15/2015   Procedure: ICD Implant;  Surgeon: Deboraha Sprang, MD;  Location: [facility];  Service: Cardiovascular;  Laterality: N/A;  . LEFT HEART CATHETERIZATION WITH CORONARY ANGIOGRAM N/A 08/08/2012   Procedure: LEFT HEART CATHETERIZATION WITH CORONARY ANGIOGRAM;  Surgeon: Peter M Martinique, MD;  Location: [facility] CATH LAB;  Service: Cardiovascular;  Laterality: N/A;  . LEFT HEART CATHETERIZATION WITH CORONARY/GRAFT ANGIOGRAM N/A 07/21/2014   Procedure: LEFT  HEART CATHETERIZATION WITH Beatrix Fetters;  Surgeon: Larey Dresser, MD;  Location: [facility] C[facility] LAB;  Service: Cardiovascular;  Laterality: N/A;  . MULTIPLE EXTRACTIONS WITH ALVEOLOPLASTY N/A 10/04/2017   Procedure: Extraction of tooth #'s 5 and 14 with alveoloplasty and gross debridement of remaining teeth;  Surgeon: [name], DDS;  Location: [place];  Service: Oral Surgery;  Laterality: N/A;  . PERCUTANEOUS CORONARY STENT INTERVENTION (PCI-S)  07/21/2014   Procedure: PERCUTANEOUS CORONARY STENT INTERVENTION (PCI-S);  Surgeon: Larey Dresser, MD;  Location: [facility] CATH LAB;  Service: Cardiovascular;;  .  REFRACTIVE SURGERY Bilateral 1990's  . RIGHT HEART CATH N/A 10/03/2017   Procedure: RIGHT HEART CATH;  Surgeon: Larey Dresser, MD;  Location: [facility];  Service: Cardiovascular;  Laterality: N/A;  . RIGHT HEART CATH N/A 12/11/2018   Procedure: RIGHT HEART CATH;  Surgeon: Larey Dresser, MD;  Location: [facility];  Service: Cardiovascular;  Laterality: N/A;  . TEE WITHOUT CARDIOVERSION N/A 08/10/2017   Procedure: TRANSESOPHAGEAL ECHOCARDIOGRAM (TEE);  Surgeon: Larey Dresser, MD;  Location: [facility] ENDOSCOPY;  Service: Cardiovascular;  Laterality: N/A;  . TONSILLECTOMY  ~ 1970  . VIDEO BRONCHOSCOPY Bilateral 11/28/2017   Procedure: VIDEO BRONCHOSCOPY WITHOUT FLUORO;  Surgeon: Brand Males, MD;  Location: [facility] ENDOSCOPY;  Service: Endoscopy;  Laterality: Bilateral;    Family History  Problem Relation Age of Onset  . Hypertension Mother   . Diabetes Mother   . Hypertension Father   . Diabetes Father   . Hyperlipidemia Father     No Known Allergies  Current Outpatient Medications on File Prior to Visit  Medication Sig Dispense Refill  . ACCU-CHEK FASTCLIX LANCETS MISC Use as directed once a day.  Dx code: E11.9 100 each 1  . albuterol (ACCUNEB) 0.63 MG/3ML nebulizer solution Take 3 mLs (0.63 mg total) by nebulization every 6 (six) hours as needed for wheezing. 75  mL 12  . albuterol (PROAIR HFA) 108 (90 Base) MCG/ACT inhaler Inhale 1-2 puffs into the lungs every 6 (six) hours as needed for wheezing or shortness of breath. (Patient taking differently: Inhale 2 puffs into the lungs every 6 (six) hours as needed for wheezing or shortness of breath. ) 1 Inhaler 3  . atorvastatin (LIPITOR) 40 MG tablet TAKE 1 TABLET (40 MG TOTAL) BY MOUTH AT BEDTIME. 90 tablet 3  . blood glucose meter kit and supplies Dispense based on patient and insurance preference. Use as directed once a day. Dx Code E11.9. 1 each 0  . Blood Glucose Monitoring Suppl (ACCU-CHEK GUIDE) w/Device KIT 1 each by Does not apply route daily. DX Code: E11.9 1 kit 0  . carvedilol (COREG) 12.5 MG tablet Take 2 tablets (25 mg total) by mouth 2 (two) times daily with a meal. (Patient taking differently: Take 12.5 mg by mouth 2 (two) times daily with a meal. ) 180 tablet 3  . digoxin (LANOXIN) 0.125 MG tablet TAKE 1/2 TABLET BY MOUTH DAILY. 15 tablet 2  . ELIQUIS 5 MG TABS tablet TAKE 1 TABLET BY MOUTH 2 TIMES DAILY. (Patient taking differently: Take 5 mg by mouth 2 (two) times daily. ) 60 tablet 5  . ENTRESTO 49-51 MG TAKE 1 TABLET BY MOUTH 2 TIMES DAILY. 60 tablet 2  . fenofibrate 160 MG tablet Take 1 tablet (160 mg total) by mouth daily. 30 tablet 2  . furosemide (LASIX) 20 MG tablet Take 2 tablets (40 mg total) by mouth daily. 30 tablet 11  . glucose blood (ACCU-CHEK GUIDE) test strip Use as directed once a day.  Dx code: E11.9 100 each 1  . guaiFENesin (MUCINEX) 600 MG 12 hr tablet Take 1 tablet (600 mg total) by mouth 2 (two) times daily as needed for to loosen phlegm.    . insulin degludec (TRESIBA FLEXTOUCH) 100 UNIT/ML SOPN FlexTouch Pen 10 u Sq daily 3 mL 3  . metFORMIN (GLUCOPHAGE) 500 MG tablet TAKE 1 TABLET BY MOUTH 2 TIMES DAILY WITH A MEAL. 60 tablet 0  . mometasone-formoterol (DULERA) 200-5 MCG/ACT AERO Inhale 2 puffs into the lungs 2 (  two) times a day. 2 Inhaler 0  . nitroGLYCERIN  (NITROSTAT) 0.4 MG SL tablet Place 1 tablet (0.4 mg total) under the tongue every 5 (five) minutes as needed for chest pain. 25 tablet 3  . Polyethyl Glycol-Propyl Glycol (SYSTANE) 0.4-0.3 % SOLN Place 1 drop into both eyes daily.    . predniSONE (DELTASONE) 10 MG tablet Take 1 tablet (10 mg total) by mouth daily with breakfast. 30 tablet 6  . silver sulfADIAZINE (SILVADENE) 1 % cream Apply 1 application topically daily. 50 g 0  . spironolactone (ALDACTONE) 25 MG tablet Take 1 tablet (25 mg total) by mouth daily. 90 tablet 3   No current facility-administered medications on file prior to visit.     BP (!) 87/54   Pulse 87   Temp (!) 96.8 F (36 C) (Temporal)   Resp 16   Ht _0  (1.778 m)   Wt 201 lb 6.4 oz (91.4 kg)   SpO2 98% Comment: 3L [place]  BMI 28.90 kg/m         Objective:   Physical Exam   General- No acute distress. Pleasant patient. Neck- Full range of motion, no jvd Lungs- Clear, even and unlabored. Heart- regular rate and rhythm. Neurologic- CNII- XII grossly intact.  Derm- lower back scattered vesicular rash that is in dermatomal pattern rt side lumbar all the way to midline then stops. Rash tender to palation. No warmth. No dc. No breakdown.       Assessment & Plan:  You do appear to have shingles eruption.  Little late in starting treatment but I do think it will still be beneficial to start Famvir this afternoon.  Also making tramadol available for nerve pain.  Rx advisement given.  Sometimes patients complain of sedation.  Best not to drive tramadol.  You have low blood pressure today and historically it does run low as well.  We will get metabolic panel and CBC.  It is possible that some of your medications for CHF causing mild dehydration.  I want you to check your blood pressure tonight and tomorrow morning.  Update [place] on blood pressure readings.  Might need to contact your cardiologist for dose changes on diuretic.  Follow-up in 7 days or as needed.   Baseline chronic lung condition requiring o2. Pt did not bring o2 in office. We gave him some during visit.  25 minutes spent with pt. 50% of time spent counseling on plan going forward.   NP student Caleen Jobs initially interviewd pt then I interviewed pt. Tx decsion made by myself.    Mackie Pai, PA-C

## 2019-04-15 ENCOUNTER — Other Ambulatory Visit: Payer: Self-pay

## 2019-04-16 ENCOUNTER — Ambulatory Visit: Payer: Medicare HMO | Admitting: Medical

## 2019-04-16 DIAGNOSIS — Z0289 Encounter for other administrative examinations: Secondary | ICD-10-CM

## 2019-04-16 MED FILL — predniSONE 10 MG TABS: 10 | 30 days supply | Qty: 30 | Fill #1

## 2019-04-16 MED FILL — FUROSEMIDE 20 MG TABS: 20 | 30 days supply | Qty: 60 | Fill #4

## 2019-04-16 MED FILL — DIGOXIN 0.125 MG TABLET: 125 | 30 days supply | Qty: 15 | Fill #1

## 2019-04-16 NOTE — Telephone Encounter (Signed)
Called [employer], rep Tamika advised that they have attempted to call patient on multiple occasions to verify ship date and address and have been unable to reach patient or leave a message. Called patient, and provided number to call to schedule and confirm shipment. Will follow up.  Phone# [phone]  4:12 PM Beatriz Chancellor, CPhT

## 2019-04-16 NOTE — Telephone Encounter (Signed)
Pt called and stated he has not received medication by mail.  [phone]

## 2019-04-18 NOTE — Telephone Encounter (Signed)
Called [employer] to see if patient called to confirm shipment. Rep Timmothy Sours confirmed patient's shipment will deliver on Monday 10/19.  Phone# [phone]

## 2019-04-28 ENCOUNTER — Other Ambulatory Visit: Payer: Self-pay | Admitting: Family Medicine

## 2019-04-28 MED FILL — SPIRONOLACTONE 25 MG TABS: 25 | 90 days supply | Qty: 90 | Fill #1

## 2019-04-28 MED FILL — CARVEDILOL 12.5 MG TABLET: 12.5 | 45 days supply | Qty: 180 | Fill #1

## 2019-04-28 MED FILL — ELIQUIS 5 MG TABLET: 5 | 30 days supply | Qty: 60 | Fill #2

## 2019-04-29 DIAGNOSIS — G4733 Obstructive sleep apnea (adult) (pediatric): Secondary | ICD-10-CM | POA: Diagnosis not present

## 2019-04-30 ENCOUNTER — Other Ambulatory Visit: Payer: Self-pay | Admitting: Cardiology

## 2019-04-30 MED FILL — ENTRESTO 49 MG-51 MG TABLET: 49-51 | 30 days supply | Qty: 60 | Fill #0

## 2019-05-01 NOTE — Telephone Encounter (Signed)
Called patient because note from 02/11/2019 stated that he stopped metformin due to diarrhea. Wasn't able to leave a voicemail due to mailbox not being set up

## 2019-05-02 ENCOUNTER — Other Ambulatory Visit: Payer: Self-pay | Admitting: *Deleted

## 2019-05-02 DIAGNOSIS — I129 Hypertensive chronic kidney disease with stage 1 through stage 4 chronic kidney disease, or unspecified chronic kidney disease: Secondary | ICD-10-CM | POA: Diagnosis not present

## 2019-05-02 DIAGNOSIS — N1832 Chronic kidney disease, stage 3b: Secondary | ICD-10-CM | POA: Diagnosis not present

## 2019-05-02 DIAGNOSIS — D631 Anemia in chronic kidney disease: Secondary | ICD-10-CM | POA: Diagnosis not present

## 2019-05-02 DIAGNOSIS — N2581 Secondary hyperparathyroidism of renal origin: Secondary | ICD-10-CM | POA: Diagnosis not present

## 2019-05-02 MED FILL — metFORMIN HCL 500 MG TABS: 500 | 30 days supply | Qty: 60 | Fill #0

## 2019-05-06 DIAGNOSIS — J45909 Unspecified asthma, uncomplicated: Secondary | ICD-10-CM | POA: Diagnosis not present

## 2019-05-06 DIAGNOSIS — I509 Heart failure, unspecified: Secondary | ICD-10-CM | POA: Diagnosis not present

## 2019-05-06 DIAGNOSIS — H2512 Age-related nuclear cataract, left eye: Secondary | ICD-10-CM | POA: Diagnosis not present

## 2019-05-06 DIAGNOSIS — H25812 Combined forms of age-related cataract, left eye: Secondary | ICD-10-CM | POA: Diagnosis not present

## 2019-05-06 DIAGNOSIS — H25042 Posterior subcapsular polar age-related cataract, left eye: Secondary | ICD-10-CM | POA: Diagnosis not present

## 2019-05-06 DIAGNOSIS — J849 Interstitial pulmonary disease, unspecified: Secondary | ICD-10-CM | POA: Diagnosis not present

## 2019-05-06 DIAGNOSIS — I2510 Atherosclerotic heart disease of native coronary artery without angina pectoris: Secondary | ICD-10-CM | POA: Diagnosis not present

## 2019-05-09 ENCOUNTER — Telehealth (HOSPITAL_COMMUNITY): Payer: Self-pay | Admitting: Licensed Clinical Social Worker

## 2019-05-09 NOTE — Telephone Encounter (Signed)
Received notification that [employer] for [employer] is back open.  Pt has active account but has ran out of funds.  CSW able to apply for a 2nd grant for patient.  Member ID: [id number] Group ID: [id number] RxBin ID: [id number] PCN: [id number] Eligibility Start Date: 05/21/2018 Eligibility End Date: 08/19/2019 Assistance Amount: $1,000.00  CSW will continue to follow and assist as needed  Jorge Ny, [facility] Desk#: [phone] Cell#: [phone]

## 2019-05-10 DIAGNOSIS — J9611 Chronic respiratory failure with hypoxia: Secondary | ICD-10-CM | POA: Diagnosis not present

## 2019-05-10 DIAGNOSIS — J849 Interstitial pulmonary disease, unspecified: Secondary | ICD-10-CM | POA: Diagnosis not present

## 2019-05-10 DIAGNOSIS — G4733 Obstructive sleep apnea (adult) (pediatric): Secondary | ICD-10-CM | POA: Diagnosis not present

## 2019-05-15 MED FILL — ATORVASTATIN 40 MG TABLET: 40 | 30 days supply | Qty: 30 | Fill #2

## 2019-05-15 MED FILL — DIGOXIN 0.125 MG TABLET: 125 | 30 days supply | Qty: 15 | Fill #2

## 2019-05-15 MED FILL — FUROSEMIDE 20 MG TABS: 20 | 30 days supply | Qty: 60 | Fill #5

## 2019-05-15 MED FILL — predniSONE 10 MG TABS: 10 | 30 days supply | Qty: 30 | Fill #2

## 2019-05-27 ENCOUNTER — Encounter: Payer: Medicare HMO | Admitting: Internal Medicine

## 2019-05-27 NOTE — Progress Notes (Deleted)
Name: KYAL ARTS  Age/ Sex: 58 y.o., male   MRN/ DOB: [id number], [birth year]     PCP: Ann Held, DO   Reason for Endocrinology Evaluation: Type 2 Diabetes Mellitus  Initial Endocrine Consultative Visit: 02/25/2019    PATIENT IDENTIFIER: Vincent Chandler is a 58 y.o. male with a past medical history of DM, ILD, and CHF. The patient has followed with Endocrinology clinic since 02/25/2019 for consultative assistance with management of his diabetes.  DIABETIC HISTORY:  Vincent Chandler was diagnosed with T2DM in 2011. He has been on Glimepiride, Janumet and Victoza in the past. His hemoglobin A1c has ranged from 6.4% in 2019, peaking at 11.2% in 2020.  On his initial presentation to our clinic his A1c was 11.2% , was on tresiba and metformin . He had been on prednisone for ~ 1 yr prior to his presentation for lung fibrosis and was being tapered down.   SUBJECTIVE:   During the last visit (02/25/2019): A1c 11.2% . We continued [place] and Metformin   Today (05/27/2019): Vincent Chandler is here for a 3 month follow up appointment on diabetes management.  He checks his blood sugars *** times daily, preprandial to breakfast and ***. The patient has *** had hypoglycemic episodes since the last clinic visit, which typically occur *** x / - most often occuring ***. The patient is *** symptomatic with these episodes, with symptoms of {symptoms; hypoglycemia:9084048}. Otherwise, the patient {HAS/HAS NOT:522402} required any recent emergency interventions for hypoglycemia and {HAS/HAS NOT:522402} had recent hospitalizations secondary to hyper or hypoglycemic episodes.    ROS: As per HPI and as detailed below: ROS    HOME DIABETES REGIMEN:  Continue Metformin 500 mg, 1 Tablet twice a day  Continue Tresiba to 10 units daily     METER DOWNLOAD SUMMARY: Date range evaluated: *** Fingerstick Blood Glucose Tests =  *** Average Number Tests/Day = *** Overall Mean FS Glucose = *** Standard Deviation = ***  BG Ranges: Low = *** High = ***   Hypoglycemic Events/30 Days: BG < 50 = *** Episodes of symptomatic severe hypoglycemia = ***    DIABETIC COMPLICATIONS: Microvascular complications:   CKD  Denies: retinopathy, neuropathy   Last eye exam: Completed 2019    Macrovascular complications:   CAD (S/P CABG ) 2014  Denies: PVD, CVA  HISTORY:  Past Medical History:  Past Medical History:  Diagnosis Date  . AICD (automatic cardioverter/defibrillator) present   . Anginal pain ([place])   . Anxiety   . Asthma   . Atrial fibrillation-postoperative 11/28/2012  . Cardiomyopathy    alcohol use related  . CHF (congestive heart failure) ([place])   . Chronic back pain   . Coronary artery disease    drug eluting stent RCA 2005-EF 30%- s/p CABG x 4; 2/4 patent grafts with SVG-PLOM and SVG-RCA system totally occluded. There are collaterals from the LAD system to the RCA and the RCA is totally occluded proximally. There are no collaterals to the LCx territory. He then underwent successful PCI of the mid left circumflex artery with overlapping Synergy drug-eluting stents  . Depression    pt denies  . Diabetes mellitus type II dx'd in the 1990's  . GERD (gastroesophageal reflux disease)    yrs ago  . H/O hiatal hernia   . Hemorrhoids   . History of hypogonadism   . History of kidney stones   . Hyperlipidemia   . Hypertension   . OSA on CPAP    "  mask is broken; working on getting a new one" (01/15/2015; 10/03/2017)  . Pneumonia    "3-4 times" (10/03/2017)  . Urinary incontinence    Past Surgical History:  Past Surgical History:  Procedure Laterality Date  . A-FLUTTER ABLATION N/A 09/12/2017   Procedure: A-FLUTTER ABLATION;  Surgeon: Deboraha Sprang, MD;  Location: [place] [facility];  Service: Cardiovascular;  Laterality: N/A;  . CARDIAC DEFIBRILLATOR PLACEMENT  01/15/2015  . CARDIOVERSION  N/A 08/10/2017   Procedure: CARDIOVERSION;  Surgeon: Larey Dresser, MD;  Location: [facility] ENDOSCOPY;  Service: Cardiovascular;  Laterality: N/A;  . CORONARY ANGIOPLASTY WITH STENT PLACEMENT  ~ 2003  . CORONARY ARTERY BYPASS GRAFT N/A 08/15/2012   Procedure: CORONARY ARTERY BYPASS GRAFTING (CABG);  Surgeon: Ivin Poot, MD;  Location: [place];  Service: Open Heart Surgery;  Laterality: N/A;  Coronary Artery Bypass Grafting Times Four Using Left Internal Mammary Artery and Right Saphenous Leg Vein Harvested Endoscopically  . EP IMPLANTABLE DEVICE N/A 01/15/2015   Procedure: ICD Implant;  Surgeon: Deboraha Sprang, MD;  Location: [place] [facility];  Service: Cardiovascular;  Laterality: N/A;  . LEFT HEART CATHETERIZATION WITH CORONARY ANGIOGRAM N/A 08/08/2012   Procedure: LEFT HEART CATHETERIZATION WITH CORONARY ANGIOGRAM;  Surgeon: Peter M Martinique, MD;  Location: [facility] CATH LAB;  Service: Cardiovascular;  Laterality: N/A;  . LEFT HEART CATHETERIZATION WITH CORONARY/GRAFT ANGIOGRAM N/A 07/21/2014   Procedure: LEFT HEART CATHETERIZATION WITH Beatrix Fetters;  Surgeon: Larey Dresser, MD;  Location: [facility] CATH LAB;  Service: Cardiovascular;  Laterality: N/A;  . MULTIPLE EXTRACTIONS WITH ALVEOLOPLASTY N/A 10/04/2017   Procedure: Extraction of tooth #'s 5 and 14 with alveoloplasty and gross debridement of remaining teeth;  Surgeon: [name], DDS;  Location: [facility];  Service: Oral Surgery;  Laterality: N/A;  . PERCUTANEOUS CORONARY STENT INTERVENTION (PCI-S)  07/21/2014   Procedure: PERCUTANEOUS CORONARY STENT INTERVENTION (PCI-S);  Surgeon: Larey Dresser, MD;  Location: [facility] CATH LAB;  Service: Cardiovascular;;  . REFRACTIVE SURGERY Bilateral 1990's  . RIGHT HEART CATH N/A 10/03/2017   Procedure: RIGHT HEART CATH;  Surgeon: Larey Dresser, MD;  Location: [place] [facility];  Service: Cardiovascular;  Laterality: N/A;  . RIGHT HEART CATH N/A 12/11/2018   Procedure: RIGHT HEART CATH;  Surgeon: Larey Dresser, MD;  Location: [place] [facility];  Service: Cardiovascular;  Laterality: N/A;  . TEE WITHOUT CARDIOVERSION N/A 08/10/2017   Procedure: TRANSESOPHAGEAL ECHOCARDIOGRAM (TEE);  Surgeon: Larey Dresser, MD;  Location: [facility]COPY;  Service: Cardiovascular;  Laterality: N/A;  . TONSILLECTOMY  ~ 1970  . VIDEO BRONCHOSCOPY Bilateral 11/28/2017   Procedure: VIDEO BRONCHOSCOPY WITHOUT FLUORO;  Surgeon: Brand Males, MD;  Location: [facility] ENDOSCOPY;  Service: Endoscopy;  Laterality: Bilateral;    Social History:  reports that he has never smoked. His smokeless tobacco use includes chew. He reports current alcohol use of about 3.0 standard drinks of alcohol per week. He reports that he does not use drugs. Family History:  Family History  Problem Relation Age of Onset  . Hypertension Mother   . Diabetes Mother   . Hypertension Father   . Diabetes Father   . Hyperlipidemia Father      HOME MEDICATIONS: Allergies as of 05/27/2019   No Known Allergies     Medication List       Accurate as of May 27, 2019 12:25 PM. If you have any questions, ask your nurse or doctor.        Accu-Chek  FastClix Lancets Misc Use as directed once a day.  Dx code: E11.9   Accu-Chek Guide w/Device Kit 1 each by Does not apply route daily. DX Code: E11.9   albuterol 108 (90 Base) MCG/ACT inhaler Commonly known as: ProAir HFA Inhale 1-2 puffs into the lungs every 6 (six) hours as needed for wheezing or shortness of breath. What changed: how much to take   albuterol 0.63 MG/3ML nebulizer solution Commonly known as: ACCUNEB Take 3 mLs (0.63 mg total) by nebulization every 6 (six) hours as needed for wheezing. What changed: Another medication with the same name was changed. Make sure you understand how and when to take each.   atorvastatin 40 MG tablet Commonly known as: LIPITOR TAKE 1 TABLET (40 MG TOTAL) BY MOUTH AT BEDTIME.   blood glucose meter kit and supplies Dispense based on patient  and insurance preference. Use as directed once a day. Dx Code E11.9.   carvedilol 12.5 MG tablet Commonly known as: COREG Take 2 tablets (25 mg total) by mouth 2 (two) times daily with a meal. What changed: how much to take   digoxin 0.125 MG tablet Commonly known as: LANOXIN TAKE 1/2 TABLET BY MOUTH DAILY.   Eliquis 5 MG Tabs tablet Generic drug: apixaban TAKE 1 TABLET BY MOUTH 2 TIMES DAILY. What changed: how much to take   Entresto 49-51 MG Generic drug: sacubitril-valsartan TAKE 1 TABLET BY MOUTH 2 TIMES DAILY.   famciclovir 500 MG tablet Commonly known as: FAMVIR Take 1 tablet (500 mg total) by mouth 3 (three) times daily.   fenofibrate 160 MG tablet Take 1 tablet (160 mg total) by mouth daily.   furosemide 20 MG tablet Commonly known as: Lasix Take 2 tablets (40 mg total) by mouth daily.   glucose blood test strip Commonly known as: Accu-Chek Guide Use as directed once a day.  Dx code: E11.9   guaiFENesin 600 MG 12 hr tablet Commonly known as: MUCINEX Take 1 tablet (600 mg total) by mouth 2 (two) times daily as needed for to loosen phlegm.   metFORMIN 500 MG tablet Commonly known as: GLUCOPHAGE TAKE 1 TABLET BY MOUTH 2 TIMES DAILY WITH A MEAL.   mometasone-formoterol 200-5 MCG/ACT Aero Commonly known as: DULERA Inhale 2 puffs into the lungs 2 (two) times a day.   nitroGLYCERIN 0.4 MG SL tablet Commonly known as: NITROSTAT Place 1 tablet (0.4 mg total) under the tongue every 5 (five) minutes as needed for chest pain.   predniSONE 10 MG tablet Commonly known as: DELTASONE Take 1 tablet (10 mg total) by mouth daily with breakfast.   silver sulfADIAZINE 1 % cream Commonly known as: Silvadene Apply 1 application topically daily.   spironolactone 25 MG tablet Commonly known as: ALDACTONE Take 1 tablet (25 mg total) by mouth daily.   Systane 0.4-0.3 % Soln Generic drug: Polyethyl Glycol-Propyl Glycol Place 1 drop into both eyes daily.   Tyler Aas  FlexTouch 100 UNIT/ML Sopn FlexTouch Pen Generic drug: insulin degludec 10 u Sq daily        OBJECTIVE:   Vital Signs: There were no vitals taken for this visit.  Wt Readings from Last 3 Encounters:  04/09/19 201 lb 6.4 oz (91.4 kg)  04/07/19 203 lb 8 oz (92.3 kg)  03/07/19 200 lb 6.4 oz (90.9 kg)     Exam: General: Pt appears well and is in NAD  Lungs: Clear with good BS bilat with no rales, rhonchi, or wheezes  Heart: RRR with normal S1 and  S2 and no gallops; no murmurs; no rub  Abdomen: Normoactive bowel sounds, soft, nontender, without masses or organomegaly palpable  Extremities: No pretibial edema.  Skin: Normal texture and temperature to palpation.   Neuro: MS is good with appropriate affect, pt is alert and Ox3      DM foot exam: 02/25/19  The skin of the feet is intact without sores or ulcerations. The pedal pulses are 2+ on right and 2+ on left. The sensation is intact to a screening 5.07, 10 gram monofilament bilaterally    DATA REVIEWED:  Lab Results  Component Value Date   HGBA1C 11.2 (H) 02/07/2019   HGBA1C 6.4 11/20/2017   HGBA1C 8.7 (H) 08/17/2017   Lab Results  Component Value Date   MICROALBUR 1.9 09/21/2015   LDLCALC 34 03/07/2019   CREATININE 1.79 (H) 04/09/2019   Lab Results  Component Value Date   MICRALBCREAT 0.8 09/21/2015     Lab Results  Component Value Date   CHOL 105 03/07/2019   HDL 30 (L) 03/07/2019   LDLCALC 34 03/07/2019   LDLDIRECT 69.0 11/20/2017   TRIG 205 (H) 03/07/2019   CHOLHDL 3.5 03/07/2019         ASSESSMENT / PLAN / RECOMMENDATIONS:   1) Type 2 Diabetes Mellitus, Poorly controlled, With Macrovascular complications - Most recent A1c of 11.2 %. Goal A1c < 7.0 %.    Plan: MEDICATIONS:  ***  EDUCATION / INSTRUCTIONS:  BG monitoring instructions: Patient is instructed to check his blood sugars *** times a day, ***.  Call [place] [facility] if: BG persistently < 70 or > 300. . I reviewed the  Rule of 15 for the treatment of hypoglycemia in detail with the patient. Literature supplied.    F/U in ***    Signed electronically by: Mack Guise, MD  [facility]  [facility] [address] [place], [place] [address] Phone: [phone] FAX: [phone]   CC: [address] [place] [address] Phone: [phone]  Fax: [phone]  Return to Endocrinology clinic as below: Future Appointments  Date Time Provider [place]  05/27/2019  1:30 PM [place] 1 [facility] None  05/27/2019  2:40 PM Shamleffer, Melanie Crazier, MD [facility] None  06/19/2019 12:00 PM Larey Dresser, MD [facility] None

## 2019-05-30 DIAGNOSIS — G4733 Obstructive sleep apnea (adult) (pediatric): Secondary | ICD-10-CM | POA: Diagnosis not present

## 2019-06-03 ENCOUNTER — Telehealth: Payer: Self-pay | Admitting: Family Medicine

## 2019-06-03 MED ORDER — METFORMIN HCL 500 MG PO TABS: ORAL_TABLET | ORAL | 0 refills | Status: DC

## 2019-06-03 MED FILL — ENTRESTO 49 MG-51 MG TABLET: 49-51 | 30 days supply | Qty: 60 | Fill #1

## 2019-06-03 MED FILL — metFORMIN HCL 500 MG TABS: 500 | 30 days supply | Qty: 60 | Fill #0

## 2019-06-03 MED FILL — ELIQUIS 5 MG TABLET: 5 | 30 days supply | Qty: 60 | Fill #3

## 2019-06-03 NOTE — Telephone Encounter (Signed)
Refill sent

## 2019-06-03 NOTE — Telephone Encounter (Signed)
rx refill  metFORMIN (GLUCOPHAGE) 500 MG tablet Patient would like to know if he still takes medication, if he does, he can please call it it.  [place], [place] - [address] (Phone) [phone] (Fax)

## 2019-06-05 ENCOUNTER — Encounter: Payer: Medicare HMO | Admitting: *Deleted

## 2019-06-05 ENCOUNTER — Other Ambulatory Visit: Payer: Self-pay

## 2019-06-05 VITALS — BP 97/60 | HR 108 | Wt 191.6 lb

## 2019-06-05 DIAGNOSIS — I2510 Atherosclerotic heart disease of native coronary artery without angina pectoris: Secondary | ICD-10-CM | POA: Diagnosis not present

## 2019-06-05 DIAGNOSIS — J45909 Unspecified asthma, uncomplicated: Secondary | ICD-10-CM | POA: Diagnosis not present

## 2019-06-05 DIAGNOSIS — I509 Heart failure, unspecified: Secondary | ICD-10-CM | POA: Diagnosis not present

## 2019-06-05 DIAGNOSIS — Z006 Encounter for examination for normal comparison and control in clinical research program: Secondary | ICD-10-CM

## 2019-06-05 DIAGNOSIS — J849 Interstitial pulmonary disease, unspecified: Secondary | ICD-10-CM | POA: Diagnosis not present

## 2019-06-06 NOTE — Research (Signed)
Beat 57M visit  Visit out of window.  Patient doing well, feeling better since starting on O2. He don't wear it all the time but when needed and at night.  Device was adjusted some with no complaints of stim or dizziness.  No med changes per patient. Will see back in 3 months.    Current Outpatient Medications:  .  ACCU-CHEK FASTCLIX LANCETS MISC, Use as directed once a day.  Dx code: E11.9, Disp: 100 each, Rfl: 1 .  albuterol (ACCUNEB) 0.63 MG/3ML nebulizer solution, Take 3 mLs (0.63 mg total) by nebulization every 6 (six) hours as needed for wheezing., Disp: 75 mL, Rfl: 12 .  albuterol (PROAIR HFA) 108 (90 Base) MCG/ACT inhaler, Inhale 1-2 puffs into the lungs every 6 (six) hours as needed for wheezing or shortness of breath. (Patient taking differently: Inhale 2 puffs into the lungs every 6 (six) hours as needed for wheezing or shortness of breath. ), Disp: 1 Inhaler, Rfl: 3 .  atorvastatin (LIPITOR) 40 MG tablet, TAKE 1 TABLET (40 MG TOTAL) BY MOUTH AT BEDTIME., Disp: 90 tablet, Rfl: 3 .  blood glucose meter kit and supplies, Dispense based on patient and insurance preference. Use as directed once a day. Dx Code E11.9., Disp: 1 each, Rfl: 0 .  Blood Glucose Monitoring Suppl (ACCU-CHEK GUIDE) w/Device KIT, 1 each by Does not apply route daily. DX Code: E11.9, Disp: 1 kit, Rfl: 0 .  carvedilol (COREG) 12.5 MG tablet, Take 2 tablets (25 mg total) by mouth 2 (two) times daily with a meal. (Patient taking differently: Take 12.5 mg by mouth 2 (two) times daily with a meal. ), Disp: 180 tablet, Rfl: 3 .  digoxin (LANOXIN) 0.125 MG tablet, TAKE 1/2 TABLET BY MOUTH DAILY., Disp: 15 tablet, Rfl: 2 .  ELIQUIS 5 MG TABS tablet, TAKE 1 TABLET BY MOUTH 2 TIMES DAILY. (Patient taking differently: Take 5 mg by mouth 2 (two) times daily. ), Disp: 60 tablet, Rfl: 5 .  ENTRESTO 49-51 MG, TAKE 1 TABLET BY MOUTH 2 TIMES DAILY., Disp: 60 tablet, Rfl: 2 .  famciclovir (FAMVIR) 500 MG tablet, Take 1 tablet (500 mg  total) by mouth 3 (three) times daily., Disp: 21 tablet, Rfl: 0 .  fenofibrate 160 MG tablet, Take 1 tablet (160 mg total) by mouth daily., Disp: 30 tablet, Rfl: 2 .  furosemide (LASIX) 20 MG tablet, Take 2 tablets (40 mg total) by mouth daily., Disp: 30 tablet, Rfl: 11 .  glucose blood (ACCU-CHEK GUIDE) test strip, Use as directed once a day.  Dx code: E11.9, Disp: 100 each, Rfl: 1 .  guaiFENesin (MUCINEX) 600 MG 12 hr tablet, Take 1 tablet (600 mg total) by mouth 2 (two) times daily as needed for to loosen phlegm., Disp: , Rfl:  .  insulin degludec (TRESIBA FLEXTOUCH) 100 UNIT/ML SOPN FlexTouch Pen, 10 u Sq daily, Disp: 3 mL, Rfl: 3 .  metFORMIN (GLUCOPHAGE) 500 MG tablet, TAKE 1 TABLET BY MOUTH 2 TIMES DAILY WITH A MEAL., Disp: 60 tablet, Rfl: 0 .  mometasone-formoterol (DULERA) 200-5 MCG/ACT AERO, Inhale 2 puffs into the lungs 2 (two) times a day., Disp: 2 Inhaler, Rfl: 0 .  nitroGLYCERIN (NITROSTAT) 0.4 MG SL tablet, Place 1 tablet (0.4 mg total) under the tongue every 5 (five) minutes as needed for chest pain., Disp: 25 tablet, Rfl: 3 .  Polyethyl Glycol-Propyl Glycol (SYSTANE) 0.4-0.3 % SOLN, Place 1 drop into both eyes daily., Disp: , Rfl:  .  predniSONE (DELTASONE) 10 MG tablet,  Take 1 tablet (10 mg total) by mouth daily with breakfast., Disp: 30 tablet, Rfl: 6 .  silver sulfADIAZINE (SILVADENE) 1 % cream, Apply 1 application topically daily., Disp: 50 g, Rfl: 0 .  spironolactone (ALDACTONE) 25 MG tablet, Take 1 tablet (25 mg total) by mouth daily., Disp: 90 tablet, Rfl: 3

## 2019-06-09 DIAGNOSIS — G4733 Obstructive sleep apnea (adult) (pediatric): Secondary | ICD-10-CM | POA: Diagnosis not present

## 2019-06-09 DIAGNOSIS — J849 Interstitial pulmonary disease, unspecified: Secondary | ICD-10-CM | POA: Diagnosis not present

## 2019-06-09 DIAGNOSIS — J9611 Chronic respiratory failure with hypoxia: Secondary | ICD-10-CM | POA: Diagnosis not present

## 2019-06-17 ENCOUNTER — Other Ambulatory Visit (HOSPITAL_COMMUNITY): Payer: Self-pay | Admitting: Cardiology

## 2019-06-17 MED FILL — DIGOXIN 0.125 MG TABLET: 125 | 30 days supply | Qty: 15 | Fill #0

## 2019-06-17 MED FILL — FUROSEMIDE 20 MG TABS: 20 | 30 days supply | Qty: 60 | Fill #6

## 2019-06-17 MED FILL — predniSONE 10 MG TABS: 10 | 30 days supply | Qty: 30 | Fill #3

## 2019-06-19 ENCOUNTER — Encounter (HOSPITAL_COMMUNITY): Payer: Self-pay | Admitting: Cardiology

## 2019-06-19 ENCOUNTER — Ambulatory Visit (HOSPITAL_COMMUNITY)
Admission: RE | Admit: 2019-06-19 | Discharge: 2019-06-19 | Disposition: A | Discharge status: Home / Self Care | Payer: Medicare HMO | Source: Ambulatory Visit | Attending: Cardiology | Admitting: Cardiology

## 2019-06-19 ENCOUNTER — Other Ambulatory Visit: Payer: Self-pay

## 2019-06-19 ENCOUNTER — Telehealth (HOSPITAL_COMMUNITY): Payer: Self-pay

## 2019-06-19 VITALS — BP 115/73 | HR 104 | Wt 181.6 lb

## 2019-06-19 DIAGNOSIS — Z7984 Long term (current) use of oral hypoglycemic drugs: Secondary | ICD-10-CM | POA: Insufficient documentation

## 2019-06-19 DIAGNOSIS — J849 Interstitial pulmonary disease, unspecified: Secondary | ICD-10-CM

## 2019-06-19 DIAGNOSIS — Z951 Presence of aortocoronary bypass graft: Secondary | ICD-10-CM | POA: Diagnosis not present

## 2019-06-19 DIAGNOSIS — J8410 Pulmonary fibrosis, unspecified: Secondary | ICD-10-CM | POA: Diagnosis not present

## 2019-06-19 DIAGNOSIS — I252 Old myocardial infarction: Secondary | ICD-10-CM | POA: Insufficient documentation

## 2019-06-19 DIAGNOSIS — Z7951 Long term (current) use of inhaled steroids: Secondary | ICD-10-CM | POA: Diagnosis not present

## 2019-06-19 DIAGNOSIS — Z79899 Other long term (current) drug therapy: Secondary | ICD-10-CM | POA: Insufficient documentation

## 2019-06-19 DIAGNOSIS — I130 Hypertensive heart and chronic kidney disease with heart failure and stage 1 through stage 4 chronic kidney disease, or unspecified chronic kidney disease: Secondary | ICD-10-CM | POA: Insufficient documentation

## 2019-06-19 DIAGNOSIS — I2723 Pulmonary hypertension due to lung diseases and hypoxia: Secondary | ICD-10-CM | POA: Diagnosis not present

## 2019-06-19 DIAGNOSIS — I255 Ischemic cardiomyopathy: Secondary | ICD-10-CM | POA: Insufficient documentation

## 2019-06-19 DIAGNOSIS — I2510 Atherosclerotic heart disease of native coronary artery without angina pectoris: Secondary | ICD-10-CM | POA: Diagnosis not present

## 2019-06-19 DIAGNOSIS — Z8249 Family history of ischemic heart disease and other diseases of the circulatory system: Secondary | ICD-10-CM | POA: Diagnosis not present

## 2019-06-19 DIAGNOSIS — I480 Paroxysmal atrial fibrillation: Secondary | ICD-10-CM | POA: Diagnosis not present

## 2019-06-19 DIAGNOSIS — Z7901 Long term (current) use of anticoagulants: Secondary | ICD-10-CM | POA: Insufficient documentation

## 2019-06-19 DIAGNOSIS — I5022 Chronic systolic (congestive) heart failure: Secondary | ICD-10-CM | POA: Diagnosis not present

## 2019-06-19 DIAGNOSIS — E785 Hyperlipidemia, unspecified: Secondary | ICD-10-CM | POA: Insufficient documentation

## 2019-06-19 DIAGNOSIS — E1122 Type 2 diabetes mellitus with diabetic chronic kidney disease: Secondary | ICD-10-CM | POA: Diagnosis not present

## 2019-06-19 DIAGNOSIS — N183 Chronic kidney disease, stage 3 unspecified: Secondary | ICD-10-CM | POA: Insufficient documentation

## 2019-06-19 DIAGNOSIS — G4733 Obstructive sleep apnea (adult) (pediatric): Secondary | ICD-10-CM | POA: Diagnosis not present

## 2019-06-19 DIAGNOSIS — I509 Heart failure, unspecified: Secondary | ICD-10-CM | POA: Diagnosis not present

## 2019-06-19 DIAGNOSIS — Z7952 Long term (current) use of systemic steroids: Secondary | ICD-10-CM | POA: Insufficient documentation

## 2019-06-19 DIAGNOSIS — I4892 Unspecified atrial flutter: Secondary | ICD-10-CM

## 2019-06-19 LAB — BASIC METABOLIC PANEL
Anion gap: 9 (ref 5–15)
BUN: 30 mg/dL — ABNORMAL HIGH (ref 6–20)
CO2: 26 mmol/L (ref 22–32)
Calcium: 9.3 mg/dL (ref 8.9–10.3)
Chloride: 104 mmol/L (ref 98–111)
Creatinine, Ser: 1.66 mg/dL — ABNORMAL HIGH (ref 0.61–1.24)
GFR calc Af Amer: 52 mL/min — ABNORMAL LOW (ref >60)
GFR calc non Af Amer: 45 mL/min — ABNORMAL LOW (ref >60)
Glucose, Bld: 143 mg/dL — ABNORMAL HIGH (ref 70–99)
Potassium: 5.3 mmol/L — ABNORMAL HIGH (ref 3.5–5.1)
Sodium: 139 mmol/L (ref 135–145)

## 2019-06-19 LAB — DIGOXIN LEVEL: Digoxin Level: <0.2 ng/mL — ABNORMAL LOW (ref 0.8–2.0)

## 2019-06-19 LAB — LIPID PANEL
Cholesterol: 168 mg/dL (ref 0–200)
HDL: 49 mg/dL (ref >40)
LDL Cholesterol: 68 mg/dL (ref 0–99)
Total CHOL/HDL Ratio: 3.4 RATIO
Triglycerides: 255 mg/dL — ABNORMAL HIGH (ref <150)
VLDL: 51 mg/dL — ABNORMAL HIGH (ref 0–40)

## 2019-06-19 NOTE — Patient Instructions (Signed)
Labs today We will only contact you if something comes back abnormal or we need to make some changes. Otherwise no news is good news!  Your physician has requested that you have an echocardiogram. Echocardiography is a painless test that uses sound waves to create images of your heart. It provides your doctor with information about the size and shape of your heart and how well your heart's chambers and valves are working. This procedure takes approximately one hour. There are no restrictions for this procedure.   Your physician recommends that you schedule a follow-up appointment in: 3 months with an echo  At the [facility], you and your health needs are our priority. As part of our continuing mission to provide you with exceptional heart care, we have created designated Provider Care Teams. These Care Teams include your primary Cardiologist (physician) and Advanced Practice Providers (APPs- Physician Assistants and Nurse Practitioners) who all work together to provide you with the care you need, when you need it.   You may see any of the following providers on your designated Care Team at your next follow up: Marland Kitchen Dr Glori Bickers . Dr Loralie Champagne . Darrick Grinder, NP . Lyda Jester, PA . Audry Riles, PharmD   Please be sure to bring in all your medications bottles to every appointment.

## 2019-06-19 NOTE — Telephone Encounter (Signed)
-----  Message from Larey Dresser, MD sent at 06/19/2019  4:22 PM EST ----- Low K diet, stop any supplemental K, repeat BMET next week.

## 2019-06-19 NOTE — Telephone Encounter (Signed)
Orders Placed This Encounter  Procedures  . Basic Metabolic Panel (BMET)    Standing Status:   Future    Standing Expiration Date:   06/18/2020

## 2019-06-19 NOTE — Progress Notes (Signed)
Date:  06/19/2019   ID:  Vincent Chandler, DOB June 04, 1961, MRN [id number]   Provider location: [facility] Type of Visit: Established patient  PCP:  Ann Held, DO  Cardiologist: Dr. [name]   History of Present Illness: Vincent Chandler is a 58 y.o. male who has a history of A flutter, chronic systolic heart failure, CAD s/p CABG, CKD Stage III, DMII, OSA .  Admitted 08/07/17 with atrial flutter/RVR and acute on chronic systolic heart failure. Underwent successful DC/CV on 2/8. Required short term milrinone but was able to wean off. HF meds started. Echo this admission with EF 15%. D/C weight 201 pounds.  CPX in 2/19 showed severe functional impairment due to heart failure. However, PFTs were restrictive and high resolution CT chest was concerning for interstitial lung disease.   He has had atrial flutter ablation.   He had RHC in 4/19 showing preserved cardiac output and low filling pressures, but moderate pulmonary hypertension.   He saw pulmonary regarding interstitial lung disease and had bronchoscopy.  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  He has been started on prednisone, cough is much improved on prednisone.    He saw Dr. Caryl Comes, not planning to upgrade ICD to CRT given IVCD but not LBBB-like.   He had a Barostim device placed in 1/20.   I tried him on [place] but he developed orthostasis after starting it and had to stop.   He has been on prednisone for suspected amiodarone pulmonary toxicity/pulmonary fibrosis.  He has had a chronic cough.   With worsened dyspnea, I took him for RHC in 6/20.  This showed normal filling pressures and low but not markedly low cardiac output. High resolution CT chest in 6/20 showed interstitial lung disease in UIP patttern. He saw pulmonary and prednisone was increased then tapered back down.   He returns today for followup of CHF and CAD.  Weight is down 19 lbs.  He has started on  Ofev, says it has not affected his breathing.  HR elevated today but has not taken Coreg yet.  Using CPAP at night and oxygen with exertion. He is chronically short of breath after walking 50 yards.  Can get around stores ok.  He always sleeps with the head of his bed elevated.  No BRBPR/melena.  No chest pain.  SBP down to 90s at times but he rarely has lightheadedness.  No falls.    Labs (2/19): free T4 increased but free T3 normal, LDL 86, HDL 30 Labs (3/19): K 4.9, creatinine 1.53, hgb 14.3, LDL 52, ANA negative, RF negative Labs (4/19): K 4.2, creatinine 1.27 Labs (5/19): LDL 69, K 4.4, creatinine 1.36 Labs (8/19): K 5.3, creatinine 2.2 Labs (10/19): K 5, creatinine 1.49 Labs (12/19): hgb 13.4 Labs (2/20): K 4.2, creatinine 1.45, hgb 14.3 Labs (5/20): K 4.2, creatinine 1.81, digoxin 0.6, LDL 16, TGs 396, hgb 13.8 Labs (6/20): creatinine 2 Labs (8/20): LDL 27, Tgs 284, LFTs normal, K 4.5, creatinine 2.3.  Labs (10/20): K 4.4, creatinine 1.79, hgb 13.4  ECG (personally reviewed): ST at 105, LAFB, poor RWP, old inferior MI, QRS 110 msec  Medtronic device interrogation: He had a brief atrial fibrillation episode recently.  Thoracic impedance trending down though fluid index < threshold.   PMH:  1. Atrial fibrillation: Noted post-op CABG.  2. Atrial flutter: Noted during 2/19 admission, DCCV done.  S/p Ablation 3/19.  3. CAD: s/p CABG.  -  LHC (1/16):  Patent LIMA-LAD and SVG-D. 80% mLCx and 99% PLOM, total occlusion of SVG-PLOM.  Total occlusion of RCA with occlusion of SVG-RCA.  Patient had DES to mLCx-PLOM.  4. HTN 5. Type II diabetes  6. Hyperlipidemia 7. Chronic systolic CHF: Ischemic cardiomyopathy.   - Echo (2/19): EF 15%, mildly dilated LV, mild LVH, inferolateral akinesis, milr MR, mildly dilated RV with severely reduced systolic function.  - CPX (2/19): Peak VO2 12.2, VE/VCO2 slope 53, RER 1.07 => severe limitation from heart failure.  - Medtronic ICD  - RHC (4/19): mean RA  5, PA 54/18 mean 31, mean PCWP 12, CI 2.5, PVR 3.6 WU - Echo (1/20): EF 20% - Barostimulator device placed in 1/20.  - RHC (6/20): mean RA 6, PA 42/17 mean 27, mean PCWP 12, CI 2.29, PVR 3.1  8. Interstitial lung disease: PFTs (2/19) were restrictive, raising concern for ILD.  - High resolution CT chest in 2/19: interstitial lung disease concerning for usual interstitial pneumonitis. - Bronchoscopy was done, possible amiodarone lung toxicity causing ILD, now on prednisone.  - High resolution chest CT (6/20): ILD in UIP pattern.   - Ofev 9. CKD stage 3 10. OSA: Home sleep study in 2020 with severe OSA.  11. ABIs (2/19): not significantly abnormal 12. Carotid dopplers (2/19): Mild disease only.  - Carotid dopplers (1/20): Minimal stenosis.  13. Pulmonary hypertension: ?Group 3 due to ILD.  14. Nephrolithiasis   Current Outpatient Medications  Medication Sig Dispense Refill  . ACCU-CHEK FASTCLIX LANCETS MISC Use as directed once a day.  Dx code: E11.9 100 each 1  . albuterol (ACCUNEB) 0.63 MG/3ML nebulizer solution Take 3 mLs (0.63 mg total) by nebulization every 6 (six) hours as needed for wheezing. 75 mL 12  . albuterol (VENTOLIN HFA) 108 (90 Base) MCG/ACT inhaler Inhale 2 puffs into the lungs every 6 (six) hours as needed for wheezing or shortness of breath.    Marland Kitchen apixaban (ELIQUIS) 5 MG TABS tablet Take 5 mg by mouth 2 (two) times daily.    Marland Kitchen atorvastatin (LIPITOR) 40 MG tablet TAKE 1 TABLET (40 MG TOTAL) BY MOUTH AT BEDTIME. 90 tablet 3  . blood glucose meter kit and supplies Dispense based on patient and insurance preference. Use as directed once a day. Dx Code E11.9. 1 each 0  . Blood Glucose Monitoring Suppl (ACCU-CHEK GUIDE) w/Device KIT 1 each by Does not apply route daily. DX Code: E11.9 1 kit 0  . carvedilol (COREG) 12.5 MG tablet Take 12.5 mg by mouth 2 (two) times daily with a meal.    . digoxin (LANOXIN) 0.125 MG tablet TAKE 1/2 TABLET BY MOUTH DAILY. 15 tablet 2  . ENTRESTO  49-51 MG TAKE 1 TABLET BY MOUTH 2 TIMES DAILY. 60 tablet 2  . famciclovir (FAMVIR) 500 MG tablet Take 1 tablet (500 mg total) by mouth 3 (three) times daily. 21 tablet 0  . furosemide (LASIX) 20 MG tablet Take 2 tablets (40 mg total) by mouth daily. 30 tablet 11  . glucose blood (ACCU-CHEK GUIDE) test strip Use as directed once a day.  Dx code: E11.9 100 each 1  . guaiFENesin (MUCINEX) 600 MG 12 hr tablet Take 1 tablet (600 mg total) by mouth 2 (two) times daily as needed for to loosen phlegm.    . metFORMIN (GLUCOPHAGE) 500 MG tablet TAKE 1 TABLET BY MOUTH 2 TIMES DAILY WITH A MEAL. 60 tablet 0  . mometasone-formoterol (DULERA) 200-5 MCG/ACT AERO Inhale 2 puffs into the  lungs 2 (two) times a day. 2 Inhaler 0  . nitroGLYCERIN (NITROSTAT) 0.4 MG SL tablet Place 1 tablet (0.4 mg total) under the tongue every 5 (five) minutes as needed for chest pain. 25 tablet 3  . OXYGEN Inhale into the lungs. 3L prn    . predniSONE (DELTASONE) 10 MG tablet Take 1 tablet (10 mg total) by mouth daily with breakfast. 30 tablet 6  . spironolactone (ALDACTONE) 25 MG tablet Take 1 tablet (25 mg total) by mouth daily. 90 tablet 3   No current facility-administered medications for this encounter.    Allergies:   Patient has no known allergies.   Social History:  The patient  reports that he has never smoked. His smokeless tobacco use includes chew. He reports current alcohol use of about 3.0 standard drinks of alcohol per week. He reports that he does not use drugs.   Family History:  The patient's family history includes Diabetes in his father and mother; Hyperlipidemia in his father; Hypertension in his father and mother.   ROS:  Please see the history of present illness.   All other systems are personally reviewed and negative.   Exam:    BP 115/73   Pulse (!) 104   Wt 82.4 kg (181 lb 9.6 oz)   SpO2 99%   BMI 26.06 kg/m  General: NAD Neck: No JVD, no thyromegaly or thyroid nodule.  Lungs: Mild dry crackles  at bases. CV: Nondisplaced PMI.  Heart regular S1/S2, no S3/S4, no murmur.  No peripheral edema.  No carotid bruit.  Normal pedal pulses.  Abdomen: Soft, nontender, no hepatosplenomegaly, no distention.  Skin: Intact without lesions or rashes.  Neurologic: Alert and oriented x 3.  Psych: Normal affect. Extremities: No clubbing or cyanosis.  HEENT: Normal.   Recent Labs: 03/07/2019: B Natriuretic Peptide 278.9 04/09/2019: ALT 16; Hemoglobin 13.4; Platelets 168.0 06/19/2019: BUN 30; Creatinine, Ser 1.66; Potassium 5.3; Sodium 139  Personally reviewed   Wt Readings from Last 3 Encounters:  06/19/19 82.4 kg (181 lb 9.6 oz)  06/05/19 86.9 kg (191 lb 9.6 oz)  04/09/19 91.4 kg (201 lb 6.4 oz)      ASSESSMENT AND PLAN:  1. Chronic systolic CHF: Ischemic cardiomyopathy. Medtronic ICD single chamber. 2/19 admission for decompensated HF requiring milrinone. Echo 2/7/019 EF 15% Mildly dilated RV. CPX in 2/19 was suggestive of severe limitation due to HF.   RHC in 4/19 showed normal filling pressures and preserved cardiac output.  Echo in 1/20 with EF 20%.  He had a Barostimulator device placed in 1/20.  RHC in 6/20 showed normal filling pressures, CI 2.29.  NYHA class III symptoms. He does not look volume overloaded on exam though thoracic impedance is trending down.  Weight is trending down.  I suspect that a large portion of his dyspnea at this point may be due to interstitial lung disease.  - Continue Coreg 12.5 mg bid. I do not think that he has BP room to increase. - Continue digoxin 0.0625 mg daily, check level today.   - Continue Entresto 49/51 bid.  I do not think that he has BP room to increase.  - Continue spironolactone 25 mg daily.    - Lasix 40 qam/20 qpm x 3 days then back to 40 mg daily.  BMET today. - Has IVCD, not LBBB-like.  Saw Dr. Caryl Comes, will not upgrade ICD to CRT.  - He may be a future LVAD candidate if lungs stabilize.   2. Afib/ Atrial flutter: Paroxysmal.  Admitted in 2/19  with severe HF decompensation in setting of atrial flutter with RVR.  He was cardioverted.  He then had atrial flutter ablation in 3/19.  He is off amiodarone due to question of amiodarone lung toxicity. No palpitations.  NSR today.  - He is now off amiodarone (question of lung toxicity, would not re-challenge in future).   - Continue Eliquis 5 mg bid. Denies bleeding.  3. CAD: S/p CABG.  Last intervention was PCI to LCx in 2016.  No chest pain.   - Continue atorvastatin, check lipids today.  - Triglycerides high, now on Vascepa with TGs not normal but improved.  - No ASA with Eliquis use.  4. Pulmonary fibrosis: PFTs in 2/19 were restrictive, and high resolution CT showed interstitial lung disease. Amiodarone was stopped due to concern for possible toxicity.  He has seen [name] => bronchoscopy suggestive of ILD related to amiodarone.  He has been on chronic prednisone.  High resolution CT chest repeated in 6/20, again showing ILD, described at UIP pattern. He has started Ofev.  He needs to wear oxygen with exertion. - Continue Ofev.    - Continue pulmonary followup.   5. OSA: Continue CPAP.    6. CKD: Stage 3. BMET today.   7. Pulmonary hypertension: Suspect group 3 PH due to lung disease (ILD).  8. DM2: He did not tolerate Farxiga   Followup in 3 months with echo.   Signed, Loralie Champagne, MD  06/19/2019  [facility] [address] [facility] [place] [address] [phone] (office) [phone] (fax)

## 2019-06-24 ENCOUNTER — Other Ambulatory Visit (HOSPITAL_COMMUNITY): Payer: Medicare HMO

## 2019-06-25 ENCOUNTER — Ambulatory Visit: Payer: Medicare HMO | Admitting: Podiatry

## 2019-06-25 ENCOUNTER — Ambulatory Visit (INDEPENDENT_AMBULATORY_CARE_PROVIDER_SITE_OTHER): Payer: Medicare HMO

## 2019-06-25 ENCOUNTER — Other Ambulatory Visit: Payer: Self-pay

## 2019-06-25 DIAGNOSIS — E08621 Diabetes mellitus due to underlying condition with foot ulcer: Secondary | ICD-10-CM | POA: Diagnosis not present

## 2019-06-25 DIAGNOSIS — L97412 Non-pressure chronic ulcer of right heel and midfoot with fat layer exposed: Secondary | ICD-10-CM

## 2019-06-25 DIAGNOSIS — L97511 Non-pressure chronic ulcer of other part of right foot limited to breakdown of skin: Secondary | ICD-10-CM | POA: Diagnosis not present

## 2019-06-25 DIAGNOSIS — E1165 Type 2 diabetes mellitus with hyperglycemia: Secondary | ICD-10-CM

## 2019-06-25 DIAGNOSIS — M79671 Pain in right foot: Secondary | ICD-10-CM | POA: Diagnosis not present

## 2019-06-25 MED ORDER — DOXYCYCLINE HYCLATE 100 MG PO TABS: 100.0000 mg | ORAL_TABLET | Freq: Two times a day (BID) | ORAL | 0 refills | Status: DC

## 2019-06-25 MED FILL — DOXYCYCLINE HYCLATE 100 MG: 100 | 10 days supply | Qty: 20 | Fill #0

## 2019-06-29 DIAGNOSIS — G4733 Obstructive sleep apnea (adult) (pediatric): Secondary | ICD-10-CM | POA: Diagnosis not present

## 2019-06-30 ENCOUNTER — Encounter: Payer: Self-pay | Admitting: Podiatry

## 2019-06-30 ENCOUNTER — Telehealth: Payer: Self-pay | Admitting: Internal Medicine

## 2019-06-30 NOTE — Telephone Encounter (Signed)
Called and spoke with pt and provided him the phone number for [facility] ([facility] Pt verbalized the phone number back to me and I verified that that was correct. Nothing further needed.

## 2019-06-30 NOTE — Progress Notes (Signed)
Subjective:  Patient ID: Vincent Chandler, male    DOB: March 09, 1961,  MRN: [id number]  Chief Complaint  Patient presents with  . Blister    pt has a possible blister/ boil on the right foot lateral side, pt states it has been going on for about a week, pt states that it is a sharp pain, when applying pressure, pt has applied alcohol, as well as wrapping it up    58 y.o. male presents for wound care.  Agree with the above statement.  Patient states that patient is a type II diabetic with last A1c of 11.2.  Patient has tried using alcohol and keeping wrapped up so does not get worse.  He denies any other acute treatment options.  He denies seeing anyone else for this.  He is well-known to Dr. Cannon Kettle and continues to follow-up with Dr. Pearletha Furl.   Review of Systems: Negative except as noted in the HPI. Denies N/V/F/Ch.  Past Medical History:  Diagnosis Date  . AICD (automatic cardioverter/defibrillator) present   . Anginal pain ([place])   . Anxiety   . Asthma   . Atrial fibrillation-postoperative 11/28/2012  . Cardiomyopathy    alcohol use related  . CHF (congestive heart failure) ([place])   . Chronic back pain   . Coronary artery disease    drug eluting stent RCA 2005-EF 30%- s/p CABG x 4; 2/4 patent grafts with SVG-PLOM and SVG-RCA system totally occluded. There are collaterals from the LAD system to the RCA and the RCA is totally occluded proximally. There are no collaterals to the LCx territory. He then underwent successful PCI of the mid left circumflex artery with overlapping Synergy drug-eluting stents  . Depression    pt denies  . Diabetes mellitus type II dx'd in the 1990's  . GERD (gastroesophageal reflux disease)    yrs ago  . H/O hiatal hernia   . Hemorrhoids   . History of hypogonadism   . History of kidney stones   . Hyperlipidemia   . Hypertension   . OSA on CPAP    "mask is broken; working on getting a new one" (01/15/2015; 10/03/2017)  . Pneumonia    "3-4 times" (10/03/2017)    . Urinary incontinence     Current Outpatient Medications:  .  ACCU-CHEK FASTCLIX LANCETS MISC, Use as directed once a day.  Dx code: E11.9, Disp: 100 each, Rfl: 1 .  albuterol (ACCUNEB) 0.63 MG/3ML nebulizer solution, Take 3 mLs (0.63 mg total) by nebulization every 6 (six) hours as needed for wheezing., Disp: 75 mL, Rfl: 12 .  albuterol (VENTOLIN HFA) 108 (90 Base) MCG/ACT inhaler, Inhale 2 puffs into the lungs every 6 (six) hours as needed for wheezing or shortness of breath., Disp: , Rfl:  .  apixaban (ELIQUIS) 5 MG TABS tablet, Take 5 mg by mouth 2 (two) times daily., Disp: , Rfl:  .  atorvastatin (LIPITOR) 40 MG tablet, TAKE 1 TABLET (40 MG TOTAL) BY MOUTH AT BEDTIME., Disp: 90 tablet, Rfl: 3 .  blood glucose meter kit and supplies, Dispense based on patient and insurance preference. Use as directed once a day. Dx Code E11.9., Disp: 1 each, Rfl: 0 .  Blood Glucose Monitoring Suppl (ACCU-CHEK GUIDE) w/Device KIT, 1 each by Does not apply route daily. DX Code: E11.9, Disp: 1 kit, Rfl: 0 .  carvedilol (COREG) 12.5 MG tablet, Take 12.5 mg by mouth 2 (two) times daily with a meal., Disp: , Rfl:  .  digoxin (LANOXIN) 0.125 MG tablet,  TAKE 1/2 TABLET BY MOUTH DAILY., Disp: 15 tablet, Rfl: 2 .  ENTRESTO 49-51 MG, TAKE 1 TABLET BY MOUTH 2 TIMES DAILY., Disp: 60 tablet, Rfl: 2 .  famciclovir (FAMVIR) 500 MG tablet, Take 1 tablet (500 mg total) by mouth 3 (three) times daily., Disp: 21 tablet, Rfl: 0 .  furosemide (LASIX) 20 MG tablet, Take 2 tablets (40 mg total) by mouth daily., Disp: 30 tablet, Rfl: 11 .  glucose blood (ACCU-CHEK GUIDE) test strip, Use as directed once a day.  Dx code: E11.9, Disp: 100 each, Rfl: 1 .  guaiFENesin (MUCINEX) 600 MG 12 hr tablet, Take 1 tablet (600 mg total) by mouth 2 (two) times daily as needed for to loosen phlegm., Disp: , Rfl:  .  metFORMIN (GLUCOPHAGE) 500 MG tablet, TAKE 1 TABLET BY MOUTH 2 TIMES DAILY WITH A MEAL., Disp: 60 tablet, Rfl: 0 .   mometasone-formoterol (DULERA) 200-5 MCG/ACT AERO, Inhale 2 puffs into the lungs 2 (two) times a day., Disp: 2 Inhaler, Rfl: 0 .  nitroGLYCERIN (NITROSTAT) 0.4 MG SL tablet, Place 1 tablet (0.4 mg total) under the tongue every 5 (five) minutes as needed for chest pain., Disp: 25 tablet, Rfl: 3 .  OXYGEN, Inhale into the lungs. 3L prn, Disp: , Rfl:  .  predniSONE (DELTASONE) 10 MG tablet, Take 1 tablet (10 mg total) by mouth daily with breakfast., Disp: 30 tablet, Rfl: 6 .  doxycycline (VIBRA-TABS) 100 MG tablet, Take 1 tablet (100 mg total) by mouth 2 (two) times daily., Disp: 20 tablet, Rfl: 0 .  spironolactone (ALDACTONE) 25 MG tablet, Take 1 tablet (25 mg total) by mouth daily., Disp: 90 tablet, Rfl: 3  Social History   Tobacco Use  Smoking Status Never Smoker  Smokeless Tobacco Current User  . Types: Chew    No Known Allergies Objective:  There were no vitals filed for this visit. There is no height or weight on file to calculate BMI. Constitutional Well developed. Well nourished.  Vascular Dorsalis pedis pulses palpable bilaterally. Posterior tibial pulses palpable bilaterally. Capillary refill normal to all digits.  No cyanosis or clubbing noted. Pedal hair growth normal.  Neurologic Normal speech. Oriented to person, place, and time. Protective sensation absent  Dermatologic Wound Location: Right lateral heel Wound Base: Mixed Granular/Fibrotic Peri-wound: Reddened mild erythema circumferentially present around the lateral heel. Exudate: Scant/small amount Serosanguinous exudate Wound Measurements: -See below  Orthopedic: No pain to palpation either foot.   Radiographs: 2 views of skeletally mature adult foot.  No cortical irregularity noted.  No other signs of osteomyelitis noted.  No other bony deformities noted.  No soft tissue emphysema noted. Assessment:   1. Diabetic ulcer of right heel associated with diabetes mellitus due to underlying condition, with fat layer  exposed ([place])   2. Pain in right foot   3. Uncontrolled type 2 diabetes mellitus with hyperglycemia ([place])    Plan:  Patient was evaluated and treated and all questions answered.  Ulcer right lateral heel -Debridement as below. -Dressed with Betadine wet-to-dry, DSD. -Continue off-loading with cam boot -I believe patient will benefit from doxycycline for skin and soft tissue infection prophylaxis.  -I will discuss with the patient to obtain diabetic shoes with the rick during next visit.   Return in about 1 week (around 07/02/2019), or See Dr. Posey Pronto, for also See Liliane Channel for Diabetic shoes.

## 2019-07-06 DIAGNOSIS — J849 Interstitial pulmonary disease, unspecified: Secondary | ICD-10-CM | POA: Diagnosis not present

## 2019-07-06 DIAGNOSIS — I2510 Atherosclerotic heart disease of native coronary artery without angina pectoris: Secondary | ICD-10-CM | POA: Diagnosis not present

## 2019-07-06 DIAGNOSIS — I509 Heart failure, unspecified: Secondary | ICD-10-CM | POA: Diagnosis not present

## 2019-07-06 DIAGNOSIS — J45909 Unspecified asthma, uncomplicated: Secondary | ICD-10-CM | POA: Diagnosis not present

## 2019-07-07 ENCOUNTER — Ambulatory Visit: Payer: Medicare HMO | Admitting: Podiatry

## 2019-07-09 ENCOUNTER — Encounter: Payer: Self-pay | Admitting: Podiatry

## 2019-07-09 ENCOUNTER — Ambulatory Visit: Payer: Medicare HMO | Admitting: Podiatry

## 2019-07-09 ENCOUNTER — Other Ambulatory Visit: Payer: Self-pay

## 2019-07-09 DIAGNOSIS — M79671 Pain in right foot: Secondary | ICD-10-CM | POA: Diagnosis not present

## 2019-07-09 DIAGNOSIS — L97511 Non-pressure chronic ulcer of other part of right foot limited to breakdown of skin: Secondary | ICD-10-CM | POA: Diagnosis not present

## 2019-07-09 DIAGNOSIS — E1165 Type 2 diabetes mellitus with hyperglycemia: Secondary | ICD-10-CM

## 2019-07-09 DIAGNOSIS — L97512 Non-pressure chronic ulcer of other part of right foot with fat layer exposed: Secondary | ICD-10-CM | POA: Diagnosis not present

## 2019-07-09 DIAGNOSIS — E08621 Diabetes mellitus due to underlying condition with foot ulcer: Secondary | ICD-10-CM

## 2019-07-10 ENCOUNTER — Encounter: Payer: Self-pay | Admitting: Podiatry

## 2019-07-10 ENCOUNTER — Telehealth: Payer: Self-pay | Admitting: Pharmacy Technician

## 2019-07-10 ENCOUNTER — Ambulatory Visit: Payer: Self-pay

## 2019-07-10 DIAGNOSIS — G4733 Obstructive sleep apnea (adult) (pediatric): Secondary | ICD-10-CM | POA: Diagnosis not present

## 2019-07-10 DIAGNOSIS — J849 Interstitial pulmonary disease, unspecified: Secondary | ICD-10-CM | POA: Diagnosis not present

## 2019-07-10 DIAGNOSIS — J9611 Chronic respiratory failure with hypoxia: Secondary | ICD-10-CM | POA: Diagnosis not present

## 2019-07-10 NOTE — Telephone Encounter (Signed)
FYI

## 2019-07-10 NOTE — Telephone Encounter (Signed)
Submitted a Prior Authorization request to [facility] for OFEV 117m via Cover My Meds. Will update once we receive a response.

## 2019-07-10 NOTE — Progress Notes (Signed)
Subjective:  Patient ID: Vincent Chandler, male    DOB: [birth year],  MRN: [id number]  Chief Complaint  Patient presents with  . Foot Pain    pt is here for a f/u of blister of the right foot, pt states that the right foot is doing a lot better since he was last here, pt also states that he has been keeping it well bandaged    59 y.o. male presents for wound care.  Agree with the above statement.  Patient is here for follow-up of the right distal lateral side.  Patient states is looking a lot better.  He has been keeping it well bandage and using triple antibiotic.  He has completed a course of doxycycline.  He has been using the cam boot as well.  He denies any other acute complaints.  He would like to know if there is something else he can transition into the has not a cam boot.  His heel ulcer has completely resolved.  Review of Systems: Negative except as noted in the HPI. Denies N/V/F/Ch.  Past Medical History:  Diagnosis Date  . AICD (automatic cardioverter/defibrillator) present   . Anginal pain ([place])   . Anxiety   . Asthma   . Atrial fibrillation-postoperative 11/28/2012  . Cardiomyopathy    alcohol use related  . CHF (congestive heart failure) ([place])   . Chronic back pain   . Coronary artery disease    drug eluting stent RCA 2005-EF 30%- s/p CABG x 4; 2/4 patent grafts with SVG-PLOM and SVG-RCA system totally occluded. There are collaterals from the LAD system to the RCA and the RCA is totally occluded proximally. There are no collaterals to the LCx territory. He then underwent successful PCI of the mid left circumflex artery with overlapping Synergy drug-eluting stents  . Depression    pt denies  . Diabetes mellitus type II dx'd in the 1990's  . GERD (gastroesophageal reflux disease)    yrs ago  . H/O hiatal hernia   . Hemorrhoids   . History of hypogonadism   . History of kidney stones   . Hyperlipidemia   . Hypertension   . OSA on CPAP    "mask is broken; working on  getting a new one" (01/15/2015; 10/03/2017)  . Pneumonia    "3-4 times" (10/03/2017)  . Urinary incontinence     Current Outpatient Medications:  .  ACCU-CHEK FASTCLIX LANCETS MISC, Use as directed once a day.  Dx code: E11.9, Disp: 100 each, Rfl: 1 .  albuterol (ACCUNEB) 0.63 MG/3ML nebulizer solution, Take 3 mLs (0.63 mg total) by nebulization every 6 (six) hours as needed for wheezing., Disp: 75 mL, Rfl: 12 .  albuterol (VENTOLIN HFA) 108 (90 Base) MCG/ACT inhaler, Inhale 2 puffs into the lungs every 6 (six) hours as needed for wheezing or shortness of breath., Disp: , Rfl:  .  apixaban (ELIQUIS) 5 MG TABS tablet, Take 5 mg by mouth 2 (two) times daily., Disp: , Rfl:  .  atorvastatin (LIPITOR) 40 MG tablet, TAKE 1 TABLET (40 MG TOTAL) BY MOUTH AT BEDTIME., Disp: 90 tablet, Rfl: 3 .  blood glucose meter kit and supplies, Dispense based on patient and insurance preference. Use as directed once a day. Dx Code E11.9., Disp: 1 each, Rfl: 0 .  Blood Glucose Monitoring Suppl (ACCU-CHEK GUIDE) w/Device KIT, 1 each by Does not apply route daily. DX Code: E11.9, Disp: 1 kit, Rfl: 0 .  carvedilol (COREG) 12.5 MG tablet, Take 12.5 mg by  mouth 2 (two) times daily with a meal., Disp: , Rfl:  .  digoxin (LANOXIN) 0.125 MG tablet, TAKE 1/2 TABLET BY MOUTH DAILY., Disp: 15 tablet, Rfl: 2 .  doxycycline (VIBRA-TABS) 100 MG tablet, Take 1 tablet (100 mg total) by mouth 2 (two) times daily., Disp: 20 tablet, Rfl: 0 .  ENTRESTO 49-51 MG, TAKE 1 TABLET BY MOUTH 2 TIMES DAILY., Disp: 60 tablet, Rfl: 2 .  famciclovir (FAMVIR) 500 MG tablet, Take 1 tablet (500 mg total) by mouth 3 (three) times daily., Disp: 21 tablet, Rfl: 0 .  furosemide (LASIX) 20 MG tablet, Take 2 tablets (40 mg total) by mouth daily., Disp: 30 tablet, Rfl: 11 .  glucose blood (ACCU-CHEK GUIDE) test strip, Use as directed once a day.  Dx code: E11.9, Disp: 100 each, Rfl: 1 .  guaiFENesin (MUCINEX) 600 MG 12 hr tablet, Take 1 tablet (600 mg total) by  mouth 2 (two) times daily as needed for to loosen phlegm., Disp: , Rfl:  .  metFORMIN (GLUCOPHAGE) 500 MG tablet, TAKE 1 TABLET BY MOUTH 2 TIMES DAILY WITH A MEAL., Disp: 60 tablet, Rfl: 0 .  mometasone-formoterol (DULERA) 200-5 MCG/ACT AERO, Inhale 2 puffs into the lungs 2 (two) times a day., Disp: 2 Inhaler, Rfl: 0 .  nitroGLYCERIN (NITROSTAT) 0.4 MG SL tablet, Place 1 tablet (0.4 mg total) under the tongue every 5 (five) minutes as needed for chest pain., Disp: 25 tablet, Rfl: 3 .  OXYGEN, Inhale into the lungs. 3L prn, Disp: , Rfl:  .  predniSONE (DELTASONE) 10 MG tablet, Take 1 tablet (10 mg total) by mouth daily with breakfast., Disp: 30 tablet, Rfl: 6 .  spironolactone (ALDACTONE) 25 MG tablet, Take 1 tablet (25 mg total) by mouth daily., Disp: 90 tablet, Rfl: 3  Social History   Tobacco Use  Smoking Status Never Smoker  Smokeless Tobacco Current User  . Types: Chew    No Known Allergies Objective:  There were no vitals filed for this visit. There is no height or weight on file to calculate BMI. Constitutional Well developed. Well nourished.  Vascular Dorsalis pedis pulses palpable bilaterally. Posterior tibial pulses palpable bilaterally. Capillary refill normal to all digits.  No cyanosis or clubbing noted. Pedal hair growth normal.  Neurologic Normal speech. Oriented to person, place, and time. Protective sensation absent  Dermatologic Wound Location: Right lateral heel resolved reepithelialized Wound Base: Granular Peri-wound: Mild hyperkeratotic lesion/callus noted around the wound.  No erythema noted.  No other clinical signs of infection noted. Exudate: Scant/small amount Serosanguinous exudate Wound Measurements: -See below  Orthopedic: No pain to palpation either foot.   Radiographs: None Assessment:   1. Diabetic ulcer of right heel associated with diabetes mellitus due to underlying condition, with fat layer exposed ([place])   2. Pain in right foot   3.  Uncontrolled type 2 diabetes mellitus with hyperglycemia ([place])   4. Right foot ulcer, limited to breakdown of skin ([place])    Plan:  Patient was evaluated and treated and all questions answered.  Right heel ulcer resolved  Ulcer right lateral fifth metatarsal head-improving -Debridement as below. -Dressed with Betadine wet-to-dry, DSD. -Continue off-loading with surgical shoe -No clinical signs of infection noted therefore I will hold off on any antibiotics for now.  -I will discuss with the patient to obtain diabetic shoes with the rick during next visit.   Procedure: Excisional Debridement of Wound Rationale: Removal of non-viable soft tissue from the wound to promote healing.  Anesthesia: none  Pre-Debridement Wound Measurements: 2 cm x 1.5 cm x 0.5 cm  Post-Debridement Wound Measurements: 1.9 cm x 1.4 cm x 0.4 cm  Type of Debridement: Sharp Excisional Tissue Removed: Non-viable soft tissue Depth of Debridement: subcutaneous tissue. Technique: Sharp excisional debridement to bleeding, viable wound base.  Dressing: Dry, sterile, compression dressing. Disposition: Patient tolerated procedure well. Patient to return in 1 week for follow-up.  Return in about 2 weeks (around 07/23/2019) for Foot Ulcer.     Return in about 2 weeks (around 07/23/2019) for Foot Ulcer.

## 2019-07-10 NOTE — Telephone Encounter (Signed)
Received notification from [facility] regarding a prior authorization for [name] Authorization has been APPROVED from 07/04/19 to 07/02/20.   Will send document to scan center.  Authorization # [id number]

## 2019-07-10 NOTE — Telephone Encounter (Signed)
Ca ll to Patient  Patient states that he was recently exposed to covid.  Would like to know when he can test  For covid. Provided  Information.  Voiced understanding.Patient reports that he was in the same car of brother and niece who are positive of covid-19.  Denies Sx.  Recommended that Patient get tested as soon 3 days if Sx.  Developes.      Reason for Disposition . [1] Caller concerned that exposure to COVID-19 occurred BUT [2] does not meet COVID-19 EXPOSURE criteria from [facility]  Answer Assessment - Initial Assessment Questions 1. COVID-19 CLOSE CONTACT: "Who is the person with the confirmed or suspected COVID-19 infection that you were exposed to?"   Brother car 2. PLACE of CONTACT: "Where were you when you were exposed to COVID-19?" (e.g., home, school, medical waiting room; which city?)     car 3. TYPE of CONTACT: "How much contact was there?" (e.g., sitting next to, live in same house, work in same office, same building)     *No Answer*sitting next to in car 4. DURATION of CONTACT: "How long were you in contact with the COVID-19 patient?" (e.g., a few seconds, passed by person, a few minutes, 15 minutes or longer, live with the patient)     Passed by person.  5. MASK: "Were you wearing a mask?" "Was the other person wearing a mask?" Note: wearing a mask reduces the risk of an  otherwise close contact.     wearing a mask 6. DATE of CONTACT: "When did you have contact with a COVID-19 patient?" (e.g., how many days ago)     *No Answer*las Friday 7. COMMUNITY SPREAD: "Are there lots of cases of COVID-19 (community spread) where you live?" (See public health department website, if unsure)       *No Answer* 8. SYMPTOMS: "Do you have any symptoms?" (e.g., fever, cough, breathing difficulty, loss of taste or smell)     Not yet 9. PREGNANCY OR POSTPARTUM: "Is there any chance you are pregnant?" "When was your last menstrual period?" "Did you deliver in the last 2 weeks?"     na10. HIGH RISK:  "Do you have any heart or lung problems? Do you have a weak immune system?" (e.g., heart failure, COPD, asthma, HIV positive, chemotherapy, renal failure, diabetes mellitus, sickle cell anemia, obesity)       Diabetes ,weak immune 11.  TRAVEL: "Have you traveled out of the country recently?" If so, "When and where?"  Also ask about out-of-state travel, since the CDC has identified some high-risk cities for community spread in the [place].  Note: Travel becomes less relevant if there is widespread community transmission where the patient lives.  Protocols used: CORONAVIRUS (COVID-19) EXPOSURE-A-AH

## 2019-07-15 ENCOUNTER — Other Ambulatory Visit (HOSPITAL_COMMUNITY): Payer: Self-pay | Admitting: Internal Medicine

## 2019-07-15 MED FILL — ENTRESTO 49 MG-51 MG TABLET: 49-51 | 30 days supply | Qty: 60 | Fill #2

## 2019-07-15 MED FILL — FUROSEMIDE 20 MG TABS: 20 | 15 days supply | Qty: 30 | Fill #7

## 2019-07-15 MED FILL — ELIQUIS 5 MG TABLET: 5 | 90 days supply | Qty: 180 | Fill #0

## 2019-07-15 MED FILL — metFORMIN HCL 500 MG TABS: 500 | 30 days supply | Qty: 60 | Fill #0

## 2019-07-15 MED FILL — predniSONE 10 MG TABS: 10 | 30 days supply | Qty: 30 | Fill #4

## 2019-07-15 MED FILL — DIGOXIN 0.125 MG TABLET: 125 | 30 days supply | Qty: 15 | Fill #1

## 2019-07-24 ENCOUNTER — Telehealth: Payer: Self-pay | Admitting: Pharmacy Technician

## 2019-07-24 DIAGNOSIS — Z5181 Encounter for therapeutic drug level monitoring: Secondary | ICD-10-CM

## 2019-07-24 DIAGNOSIS — J849 Interstitial pulmonary disease, unspecified: Secondary | ICD-10-CM

## 2019-07-24 NOTE — Telephone Encounter (Signed)
Called [employer] to check status of patient's 2021 enrollment. Rep Jenny Reichmann advised that patient has been conditionally approved on 07/18/2019 pending he applies to see if he is eligible for Medicare Low Income Subsidy. Patient must call [phone] to apply or he can apply online. They will patient 1-2 month of medication during this process. Patient was mailed a letter from [employer] [facility].  Phone# [phone]  Called patient, he has not checked his mail in a while. I advised of the above and provided him with the telephone number to apply for Medicare Extra Help.  4:01 PM Beatriz Chancellor, CPhT

## 2019-07-25 ENCOUNTER — Encounter: Payer: Self-pay | Admitting: Podiatry

## 2019-07-25 ENCOUNTER — Other Ambulatory Visit: Payer: Self-pay

## 2019-07-25 ENCOUNTER — Telehealth (HOSPITAL_COMMUNITY): Payer: Self-pay | Admitting: Pharmacist

## 2019-07-25 ENCOUNTER — Ambulatory Visit: Payer: Medicare HMO | Admitting: Podiatry

## 2019-07-25 DIAGNOSIS — M778 Other enthesopathies, not elsewhere classified: Secondary | ICD-10-CM

## 2019-07-25 DIAGNOSIS — M7751 Other enthesopathy of right foot: Secondary | ICD-10-CM | POA: Diagnosis not present

## 2019-07-25 DIAGNOSIS — L97512 Non-pressure chronic ulcer of other part of right foot with fat layer exposed: Secondary | ICD-10-CM | POA: Diagnosis not present

## 2019-07-25 DIAGNOSIS — E1165 Type 2 diabetes mellitus with hyperglycemia: Secondary | ICD-10-CM

## 2019-07-25 DIAGNOSIS — M79671 Pain in right foot: Secondary | ICD-10-CM | POA: Diagnosis not present

## 2019-07-25 NOTE — Telephone Encounter (Signed)
Received notification that PAN grant for Heart Failure is back open. Obtained PAN grant to help with copay assistance for Entresto.  Member ID: [id number] Group ID: [id number] RxBin ID: [id number] PCN: [id number] Eligibility Start Date: 08/20/2019 Eligibility End Date: 08/18/2020 Assistance Amount: $1,000.00  Audry Riles, PharmD, BCPS, BCCP, CPP Heart Failure Clinic Pharmacist [phone]

## 2019-07-25 NOTE — Progress Notes (Signed)
Subjective:  Patient ID: Vincent Chandler, male    DOB: 03-04-1961,  MRN: [id number]  Chief Complaint  Patient presents with  . Foot Ulcer    pt is here for a f/u of an ulcer of the right foot, pt shows no signs of infection, has been  keeping it well bandaged, pt also is also here to talk about diabetic shoes as well.    59 y.o. male presents for wound care.  Agree with the above statement.  Patient is here for follow-up of the right distal lateral side.  Patient states is looking a lot better.  He has been keeping it well bandage and using triple antibiotic.  He has completed a course of doxycycline.  He has been using the cam boot as well.  He denies any other acute complaints.  He would like to know if there is something else he can transition into the has not a cam boot.  His heel ulcer has completely resolved.  Review of Systems: Negative except as noted in the HPI. Denies N/V/F/Ch.  Past Medical History:  Diagnosis Date  . AICD (automatic cardioverter/defibrillator) present   . Anginal pain ([place])   . Anxiety   . Asthma   . Atrial fibrillation-postoperative 11/28/2012  . Cardiomyopathy    alcohol use related  . CHF (congestive heart failure) ([place])   . Chronic back pain   . Coronary artery disease    drug eluting stent RCA 2005-EF 30%- s/p CABG x 4; 2/4 patent grafts with SVG-PLOM and SVG-RCA system totally occluded. There are collaterals from the LAD system to the RCA and the RCA is totally occluded proximally. There are no collaterals to the LCx territory. He then underwent successful PCI of the mid left circumflex artery with overlapping Synergy drug-eluting stents  . Depression    pt denies  . Diabetes mellitus type II dx'd in the 1990's  . GERD (gastroesophageal reflux disease)    yrs ago  . H/O hiatal hernia   . Hemorrhoids   . History of hypogonadism   . History of kidney stones   . Hyperlipidemia   . Hypertension   . OSA on CPAP    "mask is broken; working on  getting a new one" (01/15/2015; 10/03/2017)  . Pneumonia    "3-4 times" (10/03/2017)  . Urinary incontinence     Current Outpatient Medications:  .  ACCU-CHEK FASTCLIX LANCETS MISC, Use as directed once a day.  Dx code: E11.9, Disp: 100 each, Rfl: 1 .  albuterol (ACCUNEB) 0.63 MG/3ML nebulizer solution, Take 3 mLs (0.63 mg total) by nebulization every 6 (six) hours as needed for wheezing., Disp: 75 mL, Rfl: 12 .  albuterol (VENTOLIN HFA) 108 (90 Base) MCG/ACT inhaler, Inhale 2 puffs into the lungs every 6 (six) hours as needed for wheezing or shortness of breath., Disp: , Rfl:  .  atorvastatin (LIPITOR) 40 MG tablet, TAKE 1 TABLET (40 MG TOTAL) BY MOUTH AT BEDTIME., Disp: 90 tablet, Rfl: 3 .  blood glucose meter kit and supplies, Dispense based on patient and insurance preference. Use as directed once a day. Dx Code E11.9., Disp: 1 each, Rfl: 0 .  Blood Glucose Monitoring Suppl (ACCU-CHEK GUIDE) w/Device KIT, 1 each by Does not apply route daily. DX Code: E11.9, Disp: 1 kit, Rfl: 0 .  carvedilol (COREG) 12.5 MG tablet, Take 12.5 mg by mouth 2 (two) times daily with a meal., Disp: , Rfl:  .  digoxin (LANOXIN) 0.125 MG tablet, TAKE 1/2 TABLET  BY MOUTH DAILY., Disp: 15 tablet, Rfl: 2 .  doxycycline (VIBRA-TABS) 100 MG tablet, Take 1 tablet (100 mg total) by mouth 2 (two) times daily., Disp: 20 tablet, Rfl: 0 .  ELIQUIS 5 MG TABS tablet, TAKE 1 TABLET BY MOUTH 2 TIMES DAILY., Disp: 180 tablet, Rfl: 3 .  ENTRESTO 49-51 MG, TAKE 1 TABLET BY MOUTH 2 TIMES DAILY., Disp: 60 tablet, Rfl: 2 .  famciclovir (FAMVIR) 500 MG tablet, Take 1 tablet (500 mg total) by mouth 3 (three) times daily., Disp: 21 tablet, Rfl: 0 .  furosemide (LASIX) 20 MG tablet, Take 2 tablets (40 mg total) by mouth daily., Disp: 30 tablet, Rfl: 11 .  glucose blood (ACCU-CHEK GUIDE) test strip, Use as directed once a day.  Dx code: E11.9, Disp: 100 each, Rfl: 1 .  guaiFENesin (MUCINEX) 600 MG 12 hr tablet, Take 1 tablet (600 mg total) by  mouth 2 (two) times daily as needed for to loosen phlegm., Disp: , Rfl:  .  metFORMIN (GLUCOPHAGE) 500 MG tablet, TAKE 1 TABLET BY MOUTH 2 TIMES DAILY WITH A MEAL., Disp: 60 tablet, Rfl: 0 .  mometasone-formoterol (DULERA) 200-5 MCG/ACT AERO, Inhale 2 puffs into the lungs 2 (two) times a day., Disp: 2 Inhaler, Rfl: 0 .  nitroGLYCERIN (NITROSTAT) 0.4 MG SL tablet, Place 1 tablet (0.4 mg total) under the tongue every 5 (five) minutes as needed for chest pain., Disp: 25 tablet, Rfl: 3 .  OXYGEN, Inhale into the lungs. 3L prn, Disp: , Rfl:  .  predniSONE (DELTASONE) 10 MG tablet, Take 1 tablet (10 mg total) by mouth daily with breakfast., Disp: 30 tablet, Rfl: 6 .  spironolactone (ALDACTONE) 25 MG tablet, Take 1 tablet (25 mg total) by mouth daily., Disp: 90 tablet, Rfl: 3  Social History   Tobacco Use  Smoking Status Never Smoker  Smokeless Tobacco Current User  . Types: Chew    No Known Allergies Objective:  There were no vitals filed for this visit. There is no height or weight on file to calculate BMI. Constitutional Well developed. Well nourished.  Vascular Dorsalis pedis pulses palpable bilaterally. Posterior tibial pulses palpable bilaterally. Capillary refill normal to all digits.  No cyanosis or clubbing noted. Pedal hair growth normal.  Neurologic Normal speech. Oriented to person, place, and time. Protective sensation absent  Dermatologic Wound Location: Right lateral heel resolved reepithelialized Wound Base: Granular Peri-wound: Mild hyperkeratotic lesion/callus noted around the wound.  No erythema noted.  No other clinical signs of infection noted. Exudate: Scant/small amount Serosanguinous exudate Wound Measurements: -See below  Orthopedic: No pain to palpation either foot.   Radiographs: None Assessment:   1. Pain in right foot   2. Uncontrolled type 2 diabetes mellitus with hyperglycemia ([place])   3. Ulcer of right foot with fat layer exposed ([place])   4.  Capsulitis of foot, right    Plan:  Patient was evaluated and treated and all questions answered.  Right fifth metatarsophalangeal joint capsulitis -I explained to the patient the etiology of capsulitis and various treatment options associated with it including steroid injection.  Patient may be compensating and walking differently and there is a change of gait which may be causing his pain.  I believe patient will benefit from a steroid injection.  Patient agrees with the plan would like to proceed with a steroid injection. -A steroid injection was performed at right fifth MPJ capsulitis using 1% plain Lidocaine and 10 mg of Kenalog. This was well tolerated.   Right  heel ulcer -Completely reepithelialized without any breakdown of skin.  Ulcer right lateral fifth metatarsal head -Resolved with complete reepithelialization of this skin. -I explained to the patient the importance of checking his feet daily to make sure that there is no further sore or wound development.  Patient states understanding and will continue to do so. -Patient will follow with the primary care doctor for evaluation for diabetic shoes.  Once patient has seen his primary care doctor I will have patient see our foot orthotist Rick for making the diabetic shoes.    Return in about 4 weeks (around 08/22/2019) for Sched with Liliane Channel for Diabetic shoes/insoles.     Return in about 4 weeks (around 08/22/2019) for Sched with Liliane Channel for Diabetic shoes/insoles.

## 2019-07-30 ENCOUNTER — Other Ambulatory Visit (HOSPITAL_COMMUNITY): Payer: Self-pay | Admitting: Cardiology

## 2019-07-30 DIAGNOSIS — G4733 Obstructive sleep apnea (adult) (pediatric): Secondary | ICD-10-CM | POA: Diagnosis not present

## 2019-07-30 MED FILL — ATORVASTATIN 40 MG TABLET: 40 | 30 days supply | Qty: 30 | Fill #3

## 2019-07-30 MED FILL — SPIRONOLACTONE 25 MG TABS: 25 | 90 days supply | Qty: 90 | Fill #2

## 2019-07-31 ENCOUNTER — Other Ambulatory Visit (HOSPITAL_COMMUNITY): Payer: Self-pay

## 2019-07-31 MED ORDER — FUROSEMIDE 20 MG PO TABS: 40.0000 mg | ORAL_TABLET | Freq: Every day | ORAL | 11 refills | Status: DC

## 2019-07-31 MED FILL — FUROSEMIDE 20 MG TABS: 20 | 30 days supply | Qty: 60 | Fill #0

## 2019-08-06 DIAGNOSIS — J849 Interstitial pulmonary disease, unspecified: Secondary | ICD-10-CM | POA: Diagnosis not present

## 2019-08-06 DIAGNOSIS — I2510 Atherosclerotic heart disease of native coronary artery without angina pectoris: Secondary | ICD-10-CM | POA: Diagnosis not present

## 2019-08-06 DIAGNOSIS — J45909 Unspecified asthma, uncomplicated: Secondary | ICD-10-CM | POA: Diagnosis not present

## 2019-08-06 DIAGNOSIS — I509 Heart failure, unspecified: Secondary | ICD-10-CM | POA: Diagnosis not present

## 2019-08-07 ENCOUNTER — Telehealth: Payer: Self-pay | Admitting: Family Medicine

## 2019-08-07 ENCOUNTER — Telehealth: Payer: Self-pay | Admitting: Internal Medicine

## 2019-08-07 NOTE — Telephone Encounter (Signed)
Please refer to encounter from 1/21 as this is dealing with that encounter.

## 2019-08-07 NOTE — Telephone Encounter (Signed)
Pt called office today 2/4 in regards to his OFEV. He stated he wanted to know what needed to be said/done when he called Medicare due to the amount pt makes. Pt would like a return call from [place].

## 2019-08-07 NOTE — Telephone Encounter (Signed)
Returned patient's call and provided the information to apply for Medicare Extra Help and phone number to [facility].

## 2019-08-07 NOTE — Telephone Encounter (Signed)
Pt was told by his foot doctor that he needs a letter from his PCP that approves him for a diabetic shoe. Please advise.

## 2019-08-07 NOTE — Telephone Encounter (Signed)
Podiatrist note actually says he needs to see me for diabetic shoe evaluation

## 2019-08-07 NOTE — Telephone Encounter (Signed)
Pt last OV 10/20 with Percell Miller for Hypertension. Please advise if ok for letter?

## 2019-08-08 ENCOUNTER — Ambulatory Visit: Payer: Medicare HMO | Admitting: Podiatry

## 2019-08-10 DIAGNOSIS — J849 Interstitial pulmonary disease, unspecified: Secondary | ICD-10-CM | POA: Diagnosis not present

## 2019-08-10 DIAGNOSIS — J9611 Chronic respiratory failure with hypoxia: Secondary | ICD-10-CM | POA: Diagnosis not present

## 2019-08-10 DIAGNOSIS — G4733 Obstructive sleep apnea (adult) (pediatric): Secondary | ICD-10-CM | POA: Diagnosis not present

## 2019-08-12 ENCOUNTER — Other Ambulatory Visit: Payer: Self-pay

## 2019-08-12 NOTE — Telephone Encounter (Signed)
See below. Can we get patient scheduled for a visit please?

## 2019-08-12 NOTE — Telephone Encounter (Signed)
Done

## 2019-08-14 ENCOUNTER — Other Ambulatory Visit: Payer: Self-pay

## 2019-08-15 ENCOUNTER — Ambulatory Visit: Payer: Medicare HMO | Admitting: Family Medicine

## 2019-08-15 DIAGNOSIS — Z0289 Encounter for other administrative examinations: Secondary | ICD-10-CM

## 2019-08-19 MED FILL — DIGOXIN 0.125 MG TABLET: 125 | 30 days supply | Qty: 15 | Fill #2

## 2019-08-19 MED FILL — predniSONE 10 MG TABS: 10 | 30 days supply | Qty: 30 | Fill #5

## 2019-08-22 ENCOUNTER — Ambulatory Visit: Payer: Medicare HMO | Admitting: Podiatry

## 2019-08-22 ENCOUNTER — Other Ambulatory Visit: Payer: Self-pay | Admitting: Cardiology

## 2019-08-22 ENCOUNTER — Other Ambulatory Visit: Payer: Self-pay | Admitting: Family Medicine

## 2019-08-22 MED FILL — ENTRESTO 49 MG-51 MG TABLET: 49-51 | 30 days supply | Qty: 60 | Fill #0

## 2019-08-25 MED FILL — ATORVASTATIN 40 MG TABLET: 40 | 30 days supply | Qty: 30 | Fill #4

## 2019-08-25 MED FILL — metFORMIN HCL 500 MG TABS: 500 | 30 days supply | Qty: 60 | Fill #0

## 2019-08-25 MED FILL — FUROSEMIDE 20 MG TABS: 20 | 30 days supply | Qty: 60 | Fill #1

## 2019-08-25 NOTE — Telephone Encounter (Signed)
Called patient  to follow up on LIS enrollment. No anwser or Voicemail setup

## 2019-08-29 ENCOUNTER — Ambulatory Visit: Payer: Medicare HMO | Admitting: Podiatry

## 2019-08-29 ENCOUNTER — Other Ambulatory Visit: Payer: Self-pay

## 2019-08-29 ENCOUNTER — Ambulatory Visit: Payer: Medicare HMO | Admitting: Orthotics

## 2019-08-29 DIAGNOSIS — L97512 Non-pressure chronic ulcer of other part of right foot with fat layer exposed: Secondary | ICD-10-CM | POA: Diagnosis not present

## 2019-08-29 DIAGNOSIS — M778 Other enthesopathies, not elsewhere classified: Secondary | ICD-10-CM | POA: Diagnosis not present

## 2019-08-29 DIAGNOSIS — M79671 Pain in right foot: Secondary | ICD-10-CM | POA: Diagnosis not present

## 2019-08-29 DIAGNOSIS — E1165 Type 2 diabetes mellitus with hyperglycemia: Secondary | ICD-10-CM | POA: Diagnosis not present

## 2019-08-30 DIAGNOSIS — G4733 Obstructive sleep apnea (adult) (pediatric): Secondary | ICD-10-CM | POA: Diagnosis not present

## 2019-09-01 MED ORDER — OFEV 150 MG PO CAPS: 150.0000 mg | ORAL_CAPSULE | Freq: Two times a day (BID) | ORAL | 0 refills | Status: DC

## 2019-09-01 NOTE — Telephone Encounter (Signed)
Attempted to contact patient to follow up on Medicare Extra Help. No answer or voicemail.  Enrolled patient into open [place] for PF. Patient was Approved for $9,000 grant. Approval dates are: 08/02/19 til 07/31/20.  [id number]  [id number]  [id number]  Please send prescription in for patient to [facility].

## 2019-09-01 NOTE — Telephone Encounter (Signed)
Patient has not had follow up labs since starting Ofev.  He is due for CBC/CMP.  Future orders placed and prescription sent to specialty pharmacy for 1 month supply and a note that future refills are pending updated labs.  Mariella Saa, PharmD, [place], [place] [facility] Pharmacist [phone]  09/01/2019 10:01 AM

## 2019-09-02 ENCOUNTER — Encounter: Payer: Self-pay | Admitting: Podiatry

## 2019-09-02 ENCOUNTER — Telehealth: Payer: Self-pay | Admitting: Internal Medicine

## 2019-09-02 NOTE — Telephone Encounter (Signed)
Spoke with pt and advised him message from [employer]. He states he is going to come in and get labs drawn. Nothing further is needed.    Yopp, Amber C, RPH-CPP    10:00 AM Note   Patient has not had follow up labs since starting Ofev.  He is due for CBC/CMP.  Future orders placed and prescription sent to specialty pharmacy for 1 month supply and a note that future refills are pending updated labs.  Mariella Saa, PharmD, [place], [place] [facility] Pharmacist [phone]  09/01/2019 10:01 AM

## 2019-09-02 NOTE — Progress Notes (Signed)
Subjective:  Patient ID: Vincent Chandler, male    DOB: Jul 10, 1960,  MRN: [id number]  Chief Complaint  Patient presents with  . Foot Ulcer    pt is here for an ulcer f/u of the right foot, pt states that the right foot is doing a lot better, pt states that pain is minimal, and pt shows no signs of infection.    59 y.o. male presents for wound care.  Agree with the above statement.  Overall patient is doing a lot better.  His pain is considerably improved.  I gave him a steroid injection last time which has helped considerably.  Patient has not been keeping his foot bandaged as his ulcer has completely healed.  He denies any other acute complaints.  Review of Systems: Negative except as noted in the HPI. Denies N/V/F/Ch.  Past Medical History:  Diagnosis Date  . AICD (automatic cardioverter/defibrillator) present   . Anginal pain ([place])   . Anxiety   . Asthma   . Atrial fibrillation-postoperative 11/28/2012  . Cardiomyopathy    alcohol use related  . CHF (congestive heart failure) ([place])   . Chronic back pain   . Coronary artery disease    drug eluting stent RCA 2005-EF 30%- s/p CABG x 4; 2/4 patent grafts with SVG-PLOM and SVG-RCA system totally occluded. There are collaterals from the LAD system to the RCA and the RCA is totally occluded proximally. There are no collaterals to the LCx territory. He then underwent successful PCI of the mid left circumflex artery with overlapping Synergy drug-eluting stents  . Depression    pt denies  . Diabetes mellitus type II dx'd in the 1990's  . GERD (gastroesophageal reflux disease)    yrs ago  . H/O hiatal hernia   . Hemorrhoids   . History of hypogonadism   . History of kidney stones   . Hyperlipidemia   . Hypertension   . OSA on CPAP    "mask is broken; working on getting a new one" (01/15/2015; 10/03/2017)  . Pneumonia    "3-4 times" (10/03/2017)  . Urinary incontinence     Current Outpatient Medications:  .  ACCU-CHEK FASTCLIX LANCETS  MISC, Use as directed once a day.  Dx code: E11.9, Disp: 100 each, Rfl: 1 .  albuterol (ACCUNEB) 0.63 MG/3ML nebulizer solution, Take 3 mLs (0.63 mg total) by nebulization every 6 (six) hours as needed for wheezing., Disp: 75 mL, Rfl: 12 .  albuterol (VENTOLIN HFA) 108 (90 Base) MCG/ACT inhaler, Inhale 2 puffs into the lungs every 6 (six) hours as needed for wheezing or shortness of breath., Disp: , Rfl:  .  atorvastatin (LIPITOR) 40 MG tablet, TAKE 1 TABLET (40 MG TOTAL) BY MOUTH AT BEDTIME., Disp: 90 tablet, Rfl: 3 .  blood glucose meter kit and supplies, Dispense based on patient and insurance preference. Use as directed once a day. Dx Code E11.9., Disp: 1 each, Rfl: 0 .  Blood Glucose Monitoring Suppl (ACCU-CHEK GUIDE) w/Device KIT, 1 each by Does not apply route daily. DX Code: E11.9, Disp: 1 kit, Rfl: 0 .  carvedilol (COREG) 12.5 MG tablet, Take 12.5 mg by mouth 2 (two) times daily with a meal., Disp: , Rfl:  .  digoxin (LANOXIN) 0.125 MG tablet, TAKE 1/2 TABLET BY MOUTH DAILY., Disp: 15 tablet, Rfl: 2 .  doxycycline (VIBRA-TABS) 100 MG tablet, Take 1 tablet (100 mg total) by mouth 2 (two) times daily., Disp: 20 tablet, Rfl: 0 .  ELIQUIS 5 MG TABS tablet,  TAKE 1 TABLET BY MOUTH 2 TIMES DAILY., Disp: 180 tablet, Rfl: 3 .  ENTRESTO 49-51 MG, TAKE 1 TABLET BY MOUTH 2 TIMES DAILY., Disp: 60 tablet, Rfl: 2 .  famciclovir (FAMVIR) 500 MG tablet, Take 1 tablet (500 mg total) by mouth 3 (three) times daily., Disp: 21 tablet, Rfl: 0 .  furosemide (LASIX) 20 MG tablet, Take 2 tablets (40 mg total) by mouth daily., Disp: 60 tablet, Rfl: 11 .  glucose blood (ACCU-CHEK GUIDE) test strip, Use as directed once a day.  Dx code: E11.9, Disp: 100 each, Rfl: 1 .  guaiFENesin (MUCINEX) 600 MG 12 hr tablet, Take 1 tablet (600 mg total) by mouth 2 (two) times daily as needed for to loosen phlegm., Disp: , Rfl:  .  metFORMIN (GLUCOPHAGE) 500 MG tablet, TAKE 1 TABLET BY MOUTH 2 TIMES DAILY WITH A MEAL., Disp: 60  tablet, Rfl: 3 .  mometasone-formoterol (DULERA) 200-5 MCG/ACT AERO, Inhale 2 puffs into the lungs 2 (two) times a day., Disp: 2 Inhaler, Rfl: 0 .  nitroGLYCERIN (NITROSTAT) 0.4 MG SL tablet, Place 1 tablet (0.4 mg total) under the tongue every 5 (five) minutes as needed for chest pain., Disp: 25 tablet, Rfl: 3 .  OXYGEN, Inhale into the lungs. 3L prn, Disp: , Rfl:  .  predniSONE (DELTASONE) 10 MG tablet, Take 1 tablet (10 mg total) by mouth daily with breakfast., Disp: 30 tablet, Rfl: 6 .  Nintedanib (OFEV) 150 MG CAPS, Take 1 capsule (150 mg total) by mouth 2 (two) times daily. Need labs prior to next refill., Disp: 60 capsule, Rfl: 0 .  spironolactone (ALDACTONE) 25 MG tablet, Take 1 tablet (25 mg total) by mouth daily., Disp: 90 tablet, Rfl: 3  Social History   Tobacco Use  Smoking Status Never Smoker  Smokeless Tobacco Current User  . Types: Chew    No Known Allergies Objective:  There were no vitals filed for this visit. There is no height or weight on file to calculate BMI. Constitutional Well developed. Well nourished.  Vascular Dorsalis pedis pulses palpable bilaterally. Posterior tibial pulses palpable bilaterally. Capillary refill normal to all digits.  No cyanosis or clubbing noted. Pedal hair growth normal.  Neurologic Normal speech. Oriented to person, place, and time. Protective sensation absent  Dermatologic  wound has completely reepithelialized.  No further wound formation noted.  No pain on palpation to the right fifth metatarsophalangeal joint.  No pain with range of motion of the fifth digit.  No pain on palpation.  Orthopedic: No pain to palpation either foot.   Radiographs: None Assessment:   1. Pain in right foot   2. Uncontrolled type 2 diabetes mellitus with hyperglycemia ([place])   3. Ulcer of right foot with fat layer exposed ([place])   4. Capsulitis of foot, right    Plan:  Patient was evaluated and treated and all questions answered.  Right fifth  metatarsophalangeal joint capsulitis -Resolved with 1 steroid injection.  I will hold off on any further steroid injections assessment help.   Right heel ulcer -Completely reepithelialized without any breakdown of skin.  Ulcer right lateral fifth metatarsal head -Patient is in the process of obtaining diabetic shoes.  He is awaiting them to be picked up.  He was already casted for the diabetic shoes. -This will be the last time I will see the patient unless if he has any other foot and ankle issues that needs addressing.  I explained to the patient that if any pain or ulceration or  discomfort that arises in the future come back and see me.  Patient states understanding    No follow-ups on file.     No follow-ups on file.

## 2019-09-03 DIAGNOSIS — J45909 Unspecified asthma, uncomplicated: Secondary | ICD-10-CM | POA: Diagnosis not present

## 2019-09-03 DIAGNOSIS — J849 Interstitial pulmonary disease, unspecified: Secondary | ICD-10-CM | POA: Diagnosis not present

## 2019-09-03 DIAGNOSIS — I2510 Atherosclerotic heart disease of native coronary artery without angina pectoris: Secondary | ICD-10-CM | POA: Diagnosis not present

## 2019-09-03 DIAGNOSIS — I509 Heart failure, unspecified: Secondary | ICD-10-CM | POA: Diagnosis not present

## 2019-09-07 DIAGNOSIS — J9611 Chronic respiratory failure with hypoxia: Secondary | ICD-10-CM | POA: Diagnosis not present

## 2019-09-07 DIAGNOSIS — G4733 Obstructive sleep apnea (adult) (pediatric): Secondary | ICD-10-CM | POA: Diagnosis not present

## 2019-09-07 DIAGNOSIS — J849 Interstitial pulmonary disease, unspecified: Secondary | ICD-10-CM | POA: Diagnosis not present

## 2019-09-18 ENCOUNTER — Other Ambulatory Visit (HOSPITAL_COMMUNITY): Payer: Self-pay | Admitting: *Deleted

## 2019-09-18 ENCOUNTER — Other Ambulatory Visit: Payer: Self-pay

## 2019-09-18 ENCOUNTER — Ambulatory Visit (HOSPITAL_COMMUNITY)
Admission: RE | Admit: 2019-09-18 | Discharge: 2019-09-18 | Disposition: A | Discharge status: Home / Self Care | Payer: Medicare HMO | Source: Ambulatory Visit | Attending: Internal Medicine | Admitting: Internal Medicine

## 2019-09-18 ENCOUNTER — Encounter: Payer: Medicare HMO | Admitting: *Deleted

## 2019-09-18 VITALS — BP 90/59 | HR 107 | Wt 181.0 lb

## 2019-09-18 DIAGNOSIS — I2510 Atherosclerotic heart disease of native coronary artery without angina pectoris: Secondary | ICD-10-CM | POA: Insufficient documentation

## 2019-09-18 DIAGNOSIS — Z833 Family history of diabetes mellitus: Secondary | ICD-10-CM | POA: Insufficient documentation

## 2019-09-18 DIAGNOSIS — I5022 Chronic systolic (congestive) heart failure: Secondary | ICD-10-CM | POA: Diagnosis present

## 2019-09-18 DIAGNOSIS — N183 Chronic kidney disease, stage 3 unspecified: Secondary | ICD-10-CM | POA: Diagnosis not present

## 2019-09-18 DIAGNOSIS — I130 Hypertensive heart and chronic kidney disease with heart failure and stage 1 through stage 4 chronic kidney disease, or unspecified chronic kidney disease: Secondary | ICD-10-CM | POA: Diagnosis not present

## 2019-09-18 DIAGNOSIS — I2723 Pulmonary hypertension due to lung diseases and hypoxia: Secondary | ICD-10-CM | POA: Diagnosis not present

## 2019-09-18 DIAGNOSIS — G4733 Obstructive sleep apnea (adult) (pediatric): Secondary | ICD-10-CM | POA: Insufficient documentation

## 2019-09-18 DIAGNOSIS — Z8249 Family history of ischemic heart disease and other diseases of the circulatory system: Secondary | ICD-10-CM | POA: Insufficient documentation

## 2019-09-18 DIAGNOSIS — Z8349 Family history of other endocrine, nutritional and metabolic diseases: Secondary | ICD-10-CM | POA: Insufficient documentation

## 2019-09-18 DIAGNOSIS — Z9981 Dependence on supplemental oxygen: Secondary | ICD-10-CM | POA: Insufficient documentation

## 2019-09-18 DIAGNOSIS — E1122 Type 2 diabetes mellitus with diabetic chronic kidney disease: Secondary | ICD-10-CM | POA: Insufficient documentation

## 2019-09-18 DIAGNOSIS — Z7952 Long term (current) use of systemic steroids: Secondary | ICD-10-CM | POA: Insufficient documentation

## 2019-09-18 DIAGNOSIS — I255 Ischemic cardiomyopathy: Secondary | ICD-10-CM | POA: Diagnosis not present

## 2019-09-18 DIAGNOSIS — Z7951 Long term (current) use of inhaled steroids: Secondary | ICD-10-CM | POA: Insufficient documentation

## 2019-09-18 DIAGNOSIS — Z951 Presence of aortocoronary bypass graft: Secondary | ICD-10-CM | POA: Insufficient documentation

## 2019-09-18 DIAGNOSIS — I4892 Unspecified atrial flutter: Secondary | ICD-10-CM | POA: Insufficient documentation

## 2019-09-18 DIAGNOSIS — Z79899 Other long term (current) drug therapy: Secondary | ICD-10-CM | POA: Diagnosis not present

## 2019-09-18 DIAGNOSIS — Z006 Encounter for examination for normal comparison and control in clinical research program: Secondary | ICD-10-CM

## 2019-09-18 DIAGNOSIS — E785 Hyperlipidemia, unspecified: Secondary | ICD-10-CM | POA: Insufficient documentation

## 2019-09-18 DIAGNOSIS — Z7984 Long term (current) use of oral hypoglycemic drugs: Secondary | ICD-10-CM | POA: Insufficient documentation

## 2019-09-18 DIAGNOSIS — J8410 Pulmonary fibrosis, unspecified: Secondary | ICD-10-CM | POA: Insufficient documentation

## 2019-09-18 DIAGNOSIS — Z7901 Long term (current) use of anticoagulants: Secondary | ICD-10-CM | POA: Insufficient documentation

## 2019-09-18 DIAGNOSIS — I4891 Unspecified atrial fibrillation: Secondary | ICD-10-CM | POA: Diagnosis not present

## 2019-09-18 DIAGNOSIS — F17220 Nicotine dependence, chewing tobacco, uncomplicated: Secondary | ICD-10-CM | POA: Diagnosis not present

## 2019-09-18 LAB — CBC
HCT: 44.7 % (ref 39.0–52.0)
Hemoglobin: 15 g/dL (ref 13.0–17.0)
MCH: 39.2 pg — ABNORMAL HIGH (ref 26.0–34.0)
MCHC: 33.6 g/dL (ref 30.0–36.0)
MCV: 116.7 fL — ABNORMAL HIGH (ref 80.0–100.0)
Platelets: 207 10*3/uL (ref 150–400)
RBC: 3.83 MIL/uL — ABNORMAL LOW (ref 4.22–5.81)
RDW: 14.6 % (ref 11.5–15.5)
WBC: 9.2 10*3/uL (ref 4.0–10.5)
nRBC: 0.2 % (ref 0.0–0.2)

## 2019-09-18 LAB — BASIC METABOLIC PANEL
Anion gap: 14 (ref 5–15)
BUN: 40 mg/dL — ABNORMAL HIGH (ref 6–20)
CO2: 26 mmol/L (ref 22–32)
Calcium: 9.9 mg/dL (ref 8.9–10.3)
Chloride: 99 mmol/L (ref 98–111)
Creatinine, Ser: 2.19 mg/dL — ABNORMAL HIGH (ref 0.61–1.24)
GFR calc Af Amer: 37 mL/min — ABNORMAL LOW (ref >60)
GFR calc non Af Amer: 32 mL/min — ABNORMAL LOW (ref >60)
Glucose, Bld: 174 mg/dL — ABNORMAL HIGH (ref 70–99)
Potassium: 4.3 mmol/L (ref 3.5–5.1)
Sodium: 139 mmol/L (ref 135–145)

## 2019-09-18 NOTE — Progress Notes (Signed)
Lab orders placed per Dr [name]

## 2019-09-18 NOTE — Research (Signed)
180M   Patient came in for 80M visit.  Reviewed meds with patient.  No AEs. 6MWT 369mBNP drawn for study Questionnaires completed today    Current Outpatient Medications:  .  ACCU-CHEK FASTCLIX LANCETS MISC, Use as directed once a day.  Dx code: E11.9, Disp: 100 each, Rfl: 1 .  albuterol (ACCUNEB) 0.63 MG/3ML nebulizer solution, Take 3 mLs (0.63 mg total) by nebulization every 6 (six) hours as needed for wheezing., Disp: 75 mL, Rfl: 12 .  albuterol (VENTOLIN HFA) 108 (90 Base) MCG/ACT inhaler, Inhale 2 puffs into the lungs every 6 (six) hours as needed for wheezing or shortness of breath., Disp: , Rfl:  .  atorvastatin (LIPITOR) 40 MG tablet, TAKE 1 TABLET (40 MG TOTAL) BY MOUTH AT BEDTIME., Disp: 90 tablet, Rfl: 3 .  blood glucose meter kit and supplies, Dispense based on patient and insurance preference. Use as directed once a day. Dx Code E11.9., Disp: 1 each, Rfl: 0 .  Blood Glucose Monitoring Suppl (ACCU-CHEK GUIDE) w/Device KIT, 1 each by Does not apply route daily. DX Code: E11.9, Disp: 1 kit, Rfl: 0 .  carvedilol (COREG) 12.5 MG tablet, Take 12.5 mg by mouth 2 (two) times daily with a meal., Disp: , Rfl:  .  digoxin (LANOXIN) 0.125 MG tablet, TAKE 1/2 TABLET BY MOUTH DAILY., Disp: 15 tablet, Rfl: 2 .  doxycycline (VIBRA-TABS) 100 MG tablet, Take 1 tablet (100 mg total) by mouth 2 (two) times daily., Disp: 20 tablet, Rfl: 0 .  ELIQUIS 5 MG TABS tablet, TAKE 1 TABLET BY MOUTH 2 TIMES DAILY., Disp: 180 tablet, Rfl: 3 .  ENTRESTO 49-51 MG, TAKE 1 TABLET BY MOUTH 2 TIMES DAILY., Disp: 60 tablet, Rfl: 2 .  famciclovir (FAMVIR) 500 MG tablet, Take 1 tablet (500 mg total) by mouth 3 (three) times daily., Disp: 21 tablet, Rfl: 0 .  furosemide (LASIX) 20 MG tablet, Take 2 tablets (40 mg total) by mouth daily., Disp: 60 tablet, Rfl: 11 .  glucose blood (ACCU-CHEK GUIDE) test strip, Use as directed once a day.  Dx code: E11.9, Disp: 100 each, Rfl: 1 .  guaiFENesin (MUCINEX) 600 MG 12 hr  tablet, Take 1 tablet (600 mg total) by mouth 2 (two) times daily as needed for to loosen phlegm., Disp: , Rfl:  .  metFORMIN (GLUCOPHAGE) 500 MG tablet, TAKE 1 TABLET BY MOUTH 2 TIMES DAILY WITH A MEAL., Disp: 60 tablet, Rfl: 3 .  mometasone-formoterol (DULERA) 200-5 MCG/ACT AERO, Inhale 2 puffs into the lungs 2 (two) times a day., Disp: 2 Inhaler, Rfl: 0 .  Nintedanib (OFEV) 150 MG CAPS, Take 1 capsule (150 mg total) by mouth 2 (two) times daily. Need labs prior to next refill., Disp: 60 capsule, Rfl: 0 .  nitroGLYCERIN (NITROSTAT) 0.4 MG SL tablet, Place 1 tablet (0.4 mg total) under the tongue every 5 (five) minutes as needed for chest pain., Disp: 25 tablet, Rfl: 3 .  OXYGEN, Inhale into the lungs. 3L prn, Disp: , Rfl:  .  predniSONE (DELTASONE) 10 MG tablet, Take 1 tablet (10 mg total) by mouth daily with breakfast., Disp: 30 tablet, Rfl: 6 .  spironolactone (ALDACTONE) 25 MG tablet, Take 1 tablet (25 mg total) by mouth daily., Disp: 90 tablet, Rfl: 3

## 2019-09-19 ENCOUNTER — Ambulatory Visit (HOSPITAL_BASED_OUTPATIENT_CLINIC_OR_DEPARTMENT_OTHER)
Admission: RE | Admit: 2019-09-19 | Discharge: 2019-09-19 | Disposition: A | Discharge status: Home / Self Care | Payer: Medicare HMO | Source: Ambulatory Visit | Attending: Cardiology | Admitting: Cardiology

## 2019-09-19 ENCOUNTER — Encounter (HOSPITAL_COMMUNITY): Payer: Self-pay | Admitting: Cardiology

## 2019-09-19 ENCOUNTER — Ambulatory Visit (HOSPITAL_BASED_OUTPATIENT_CLINIC_OR_DEPARTMENT_OTHER)
Admission: RE | Admit: 2019-09-19 | Discharge: 2019-09-19 | Disposition: A | Discharge status: Home / Self Care | Payer: Medicare HMO | Source: Ambulatory Visit | Attending: Internal Medicine | Admitting: Internal Medicine

## 2019-09-19 VITALS — BP 104/60 | HR 87 | Wt 182.4 lb

## 2019-09-19 DIAGNOSIS — G4733 Obstructive sleep apnea (adult) (pediatric): Secondary | ICD-10-CM | POA: Diagnosis not present

## 2019-09-19 DIAGNOSIS — J8410 Pulmonary fibrosis, unspecified: Secondary | ICD-10-CM | POA: Diagnosis not present

## 2019-09-19 DIAGNOSIS — I5022 Chronic systolic (congestive) heart failure: Secondary | ICD-10-CM

## 2019-09-19 DIAGNOSIS — I4892 Unspecified atrial flutter: Secondary | ICD-10-CM | POA: Diagnosis not present

## 2019-09-19 DIAGNOSIS — I4891 Unspecified atrial fibrillation: Secondary | ICD-10-CM | POA: Diagnosis not present

## 2019-09-19 DIAGNOSIS — I255 Ischemic cardiomyopathy: Secondary | ICD-10-CM | POA: Diagnosis not present

## 2019-09-19 DIAGNOSIS — E1122 Type 2 diabetes mellitus with diabetic chronic kidney disease: Secondary | ICD-10-CM | POA: Diagnosis not present

## 2019-09-19 DIAGNOSIS — J849 Interstitial pulmonary disease, unspecified: Secondary | ICD-10-CM | POA: Diagnosis not present

## 2019-09-19 DIAGNOSIS — N183 Chronic kidney disease, stage 3 unspecified: Secondary | ICD-10-CM | POA: Diagnosis not present

## 2019-09-19 DIAGNOSIS — I130 Hypertensive heart and chronic kidney disease with heart failure and stage 1 through stage 4 chronic kidney disease, or unspecified chronic kidney disease: Secondary | ICD-10-CM | POA: Diagnosis not present

## 2019-09-19 DIAGNOSIS — I2510 Atherosclerotic heart disease of native coronary artery without angina pectoris: Secondary | ICD-10-CM | POA: Diagnosis not present

## 2019-09-19 MED ORDER — SACUBITRIL-VALSARTAN 24-26 MG PO TABS: 1.0000 | ORAL_TABLET | Freq: Two times a day (BID) | ORAL | 5 refills | Status: DC

## 2019-09-19 MED ORDER — FUROSEMIDE 20 MG PO TABS: ORAL_TABLET | ORAL | 5 refills | Status: DC

## 2019-09-19 MED FILL — FUROSEMIDE 20 MG TABS: 20 | 20 days supply | Qty: 60 | Fill #0

## 2019-09-19 MED FILL — ENTRESTO 24 MG-26 MG TABLET: 24-26 | 30 days supply | Qty: 60 | Fill #0

## 2019-09-19 NOTE — Progress Notes (Signed)
  Echocardiogram 2D Echocardiogram has been performed.  Vincent Chandler 09/19/2019, 12:09 PM

## 2019-09-19 NOTE — Patient Instructions (Addendum)
DECREASE Entresto to 24/54m (1 tab) twice a day   INCREASE Lasix to 441min the morning and 2082mn the evening   Labs in 10 days We will only contact you if something comes back abnormal or we need to make some changes. Otherwise no news is good news!   Please make an appointment with Dr [name] pulmonary.    Your physician has recommended that you have a cardiopulmonary stress test (CPX). CPX testing is a non-invasive measurement of heart and lung function. It replaces a traditional treadmill stress test. This type of test provides a tremendous amount of information that relates not only to your present condition but also for future outcomes. This test combines measurements of you ventilation, respiratory gas exchange in the lungs, electrocardiogram (EKG), blood pressure and physical response before, during, and following an exercise protocol.    Your physician recommends that you schedule a follow-up appointment in: 1 month with the Nurse Practitioner/Physicians Assistant   Please call office at [phone] 2 if you have any questions or concerns.   At the [facility] and your health needs are our priority. As part of our continuing mission to provide you with exceptional heart care, we have created designated Provider Care Teams. These Care Teams include your primary Cardiologist (physician) and Advanced Practice Providers (APPs- Physician Assistants and Nurse Practitioners) who all work together to provide you with the care you need, when you need it.   You may see any of the following providers on your designated Care Team at your next follow up: . DMarland Kitchen DanGlori BickersDr DalLoralie ChampagneAmyDarrick GrinderP . BriLyda JesterA . LauAudry RilesharmD   Please be sure to bring in all your medications bottles to every appointment.     Breat

## 2019-09-21 NOTE — Progress Notes (Signed)
Date:  09/21/2019   ID:  Vincent Chandler, DOB [birth year], MRN [id number]   Provider location:  Advanced Heart Failure Type of Visit: Established patient  PCP:  Ann Held, DO  Cardiologist: Dr. [name]   History of Present Illness: Vincent Chandler is a 59 y.o. male who has a history of A flutter, chronic systolic heart failure, CAD s/p CABG, CKD Stage III, DMII, OSA .  Admitted 08/07/17 with atrial flutter/RVR and acute on chronic systolic heart failure. Underwent successful DC/CV on 2/8. Required short term milrinone but was able to wean off. HF meds started. Echo this admission with EF 15%. D/C weight 201 pounds.  CPX in 2/19 showed severe functional impairment due to heart failure. However, PFTs were restrictive and high resolution CT chest was concerning for interstitial lung disease.   He has had atrial flutter ablation.   He had RHC in 4/19 showing preserved cardiac output and low filling pressures, but moderate pulmonary hypertension.   He saw pulmonary regarding interstitial lung disease and had bronchoscopy.  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  He has been started on prednisone, cough is much improved on prednisone.    He saw Dr. Caryl Comes, not planning to upgrade ICD to CRT given IVCD but not LBBB-like.   He had a Barostim device placed in 1/20.   I tried him on [place] but he developed orthostasis after starting it and had to stop.   He has been on prednisone for suspected amiodarone pulmonary toxicity/pulmonary fibrosis.  He has had a chronic cough.   With worsened dyspnea, I took him for RHC in 6/20.  This showed normal filling pressures and low but not markedly low cardiac output. High resolution CT chest in 6/20 showed interstitial lung disease in UIP patttern. He saw pulmonary and prednisone was increased then tapered back down.   Echo was done today, showing EF 20-25%, mild RV dilation with moderately decreased systolic  function.   He returns today for followup of CHF and CAD.  Using CPAP at night and oxygen with exertion. Breathing has gradually worsened.  He is short of breath with stairs or with walking 50-100 yards.  No chest pain.  No orthopnea/PND.  He gets lightheaded and SBP runs 80s-90s when he checks at home.   Labs (2/19): free T4 increased but free T3 normal, LDL 86, HDL 30 Labs (3/19): K 4.9, creatinine 1.53, hgb 14.3, LDL 52, ANA negative, RF negative Labs (4/19): K 4.2, creatinine 1.27 Labs (5/19): LDL 69, K 4.4, creatinine 1.36 Labs (8/19): K 5.3, creatinine 2.2 Labs (10/19): K 5, creatinine 1.49 Labs (12/19): hgb 13.4 Labs (2/20): K 4.2, creatinine 1.45, hgb 14.3 Labs (5/20): K 4.2, creatinine 1.81, digoxin 0.6, LDL 16, TGs 396, hgb 13.8 Labs (6/20): creatinine 2 Labs (8/20): LDL 27, Tgs 284, LFTs normal, K 4.5, creatinine 2.3.  Labs (10/20): K 4.4, creatinine 1.79, hgb 13.4 Labs (12/20): LDL 68, TGs 255 Labs (3/21): K 4.3, creatinine 2.19  ECG (personally reviewed): NSR, 1st degree AVB, poor RWP  Medtronic device interrogation: No VT/AF, decreased impedance.   PMH:  1. Atrial fibrillation: Noted post-op CABG.  2. Atrial flutter: Noted during 2/19 admission, DCCV done.  S/p Ablation 3/19.  3. CAD: s/p CABG.  - LHC (1/16):  Patent LIMA-LAD and SVG-D. 80% mLCx and 99% PLOM, total occlusion of SVG-PLOM.  Total occlusion of RCA with occlusion of SVG-RCA.  Patient had DES to mLCx-PLOM.  4. HTN 5. Type II diabetes  6. Hyperlipidemia 7. Chronic systolic CHF: Ischemic cardiomyopathy.   - Echo (2/19): EF 15%, mildly dilated LV, mild LVH, inferolateral akinesis, milr MR, mildly dilated RV with severely reduced systolic function.  - CPX (2/19): Peak VO2 12.2, VE/VCO2 slope 53, RER 1.07 => severe limitation from heart failure.  - Medtronic ICD  - RHC (4/19): mean RA 5, PA 54/18 mean 31, mean PCWP 12, CI 2.5, PVR 3.6 WU - Echo (1/20): EF 20% - Barostimulator device placed in 1/20.  - RHC  (6/20): mean RA 6, PA 42/17 mean 27, mean PCWP 12, CI 2.29, PVR 3.1  - Echo (3/21): EF 20-25%, mild RV dilation with moderately decreased systolic function 8. Interstitial lung disease: PFTs (2/19) were restrictive, raising concern for ILD.  - High resolution CT chest in 2/19: interstitial lung disease concerning for usual interstitial pneumonitis. - Bronchoscopy was done, possible amiodarone lung toxicity causing ILD, now on prednisone.  - High resolution chest CT (6/20): ILD in UIP pattern.   - Ofev 9. CKD stage 3 10. OSA: Home sleep study in 2020 with severe OSA.  11. ABIs (2/19): not significantly abnormal 12. Carotid dopplers (2/19): Mild disease only.  - Carotid dopplers (1/20): Minimal stenosis.  13. Pulmonary hypertension: ?Group 3 due to ILD.  14. Nephrolithiasis   Current Outpatient Medications  Medication Sig Dispense Refill  . ACCU-CHEK FASTCLIX LANCETS MISC Use as directed once a day.  Dx code: E11.9 100 each 1  . albuterol (ACCUNEB) 0.63 MG/3ML nebulizer solution Take 3 mLs (0.63 mg total) by nebulization every 6 (six) hours as needed for wheezing. 75 mL 12  . albuterol (VENTOLIN HFA) 108 (90 Base) MCG/ACT inhaler Inhale 2 puffs into the lungs every 6 (six) hours as needed for wheezing or shortness of breath.    Marland Kitchen atorvastatin (LIPITOR) 40 MG tablet TAKE 1 TABLET (40 MG TOTAL) BY MOUTH AT BEDTIME. 90 tablet 3  . blood glucose meter kit and supplies Dispense based on patient and insurance preference. Use as directed once a day. Dx Code E11.9. 1 each 0  . Blood Glucose Monitoring Suppl (ACCU-CHEK GUIDE) w/Device KIT 1 each by Does not apply route daily. DX Code: E11.9 1 kit 0  . carvedilol (COREG) 12.5 MG tablet Take 12.5 mg by mouth 2 (two) times daily with a meal.    . digoxin (LANOXIN) 0.125 MG tablet TAKE 1/2 TABLET BY MOUTH DAILY. 15 tablet 2  . ELIQUIS 5 MG TABS tablet TAKE 1 TABLET BY MOUTH 2 TIMES DAILY. 180 tablet 3  . furosemide (LASIX) 20 MG tablet Take 2 tablets (40  mg total) by mouth every morning AND 1 tablet (20 mg total) every evening. 60 tablet 5  . glucose blood (ACCU-CHEK GUIDE) test strip Use as directed once a day.  Dx code: E11.9 100 each 1  . guaiFENesin (MUCINEX) 600 MG 12 hr tablet Take 1 tablet (600 mg total) by mouth 2 (two) times daily as needed for to loosen phlegm.    . metFORMIN (GLUCOPHAGE) 500 MG tablet TAKE 1 TABLET BY MOUTH 2 TIMES DAILY WITH A MEAL. 60 tablet 3  . mometasone-formoterol (DULERA) 200-5 MCG/ACT AERO Inhale 2 puffs into the lungs 2 (two) times a day. 2 Inhaler 0  . Nintedanib (OFEV) 150 MG CAPS Take 1 capsule (150 mg total) by mouth 2 (two) times daily. Need labs prior to next refill. 60 capsule 0  . nitroGLYCERIN (NITROSTAT) 0.4 MG SL tablet Place 1 tablet (0.4  mg total) under the tongue every 5 (five) minutes as needed for chest pain. 25 tablet 3  . OXYGEN Inhale into the lungs. 3L prn    . predniSONE (DELTASONE) 10 MG tablet Take 1 tablet (10 mg total) by mouth daily with breakfast. 30 tablet 6  . spironolactone (ALDACTONE) 25 MG tablet Take 1 tablet (25 mg total) by mouth daily. 90 tablet 3  . sacubitril-valsartan (ENTRESTO) 24-26 MG Take 1 tablet by mouth 2 (two) times daily. 60 tablet 5   No current facility-administered medications for this encounter.    Allergies:   Patient has no known allergies.   Social History:  The patient  reports that he has never smoked. His smokeless tobacco use includes chew. He reports current alcohol use of about 3.0 standard drinks of alcohol per week. He reports that he does not use drugs.   Family History:  The patient's family history includes Diabetes in his father and mother; Hyperlipidemia in his father; Hypertension in his father and mother.   ROS:  Please see the history of present illness.   All other systems are personally reviewed and negative.   Exam:    BP 104/60   Pulse 87   Wt 82.7 kg (182 lb 6.4 oz)   SpO2 93%   BMI 26.17 kg/m  General: NAD Neck: Thick, JVP  difficult, no thyromegaly or thyroid nodule.  Lungs: Dry crackles at bases.  CV: Nondisplaced PMI.  Heart regular S1/S2, no S3/S4, no murmur.  No peripheral edema.  No carotid bruit.  Normal pedal pulses.  Abdomen: Soft, nontender, no hepatosplenomegaly, no distention.  Skin: Intact without lesions or rashes.  Neurologic: Alert and oriented x 3.  Psych: Normal affect. Extremities: No clubbing or cyanosis.  HEENT: Normal.   Recent Labs: 03/07/2019: B Natriuretic Peptide 278.9 04/09/2019: ALT 16 09/18/2019: BUN 40; Creatinine, Ser 2.19; Hemoglobin 15.0; Platelets 207; Potassium 4.3; Sodium 139  Personally reviewed   Wt Readings from Last 3 Encounters:  09/19/19 82.7 kg (182 lb 6.4 oz)  09/18/19 82.1 kg (181 lb)  06/19/19 82.4 kg (181 lb 9.6 oz)      ASSESSMENT AND PLAN:  1. Chronic systolic CHF: Ischemic cardiomyopathy. Medtronic ICD single chamber. 2/19 admission for decompensated HF requiring milrinone. Echo 2/7/019 EF 15% Mildly dilated RV. CPX in 2/19 was suggestive of severe limitation due to HF.   RHC in 4/19 showed normal filling pressures and preserved cardiac output.  Echo in 1/20 with EF 20%.  He had a Barostimulator device placed in 1/20.  RHC in 6/20 showed normal filling pressures, CI 2.29.  Chronic NYHA class III symptoms. Decreased thoracic impedance on device suggests a degree of volume overload.  However, I also suspect that a large portion of his dyspnea at this point is due to interstitial lung disease. BP is running low and he has occasional orthostatic symptoms.  - Continue Coreg 12.5 mg bid.  - Continue digoxin 0.0625 mg daily, check level.   - With low BP, decrease Entresto to 24/26 bid.   - Continue spironolactone 25 mg daily.    - Increase Lasix to 40 qam/20 qpm, BMET 10 days. - Has IVCD, not LBBB-like.  Saw Dr. Caryl Comes, will not upgrade ICD to CRT.  - I will obtain CPX.  - With significant ILD requiring oxygen, I am not sure that he would be an LVAD candidate.   2.  Afib/ Atrial flutter: Paroxysmal.  Admitted in 2/19 with severe HF decompensation in setting of atrial flutter  with RVR.  He was cardioverted.  He then had atrial flutter ablation in 3/19.  He is off amiodarone due to question of amiodarone lung toxicity. No palpitations.  NSR today.  - He is now off amiodarone (question of lung toxicity, would not re-challenge in future).   - Continue Eliquis 5 mg bid. Denies bleeding.  3. CAD: S/p CABG.  Last intervention was PCI to LCx in 2016.  No chest pain.   - Continue atorvastatin, good LDL in 12/20.   - Triglycerides high, now on Vascepa with TGs not normal but improved.  - No ASA with Eliquis use.  4. Pulmonary fibrosis: PFTs in 2/19 were restrictive, and high resolution CT showed interstitial lung disease. Amiodarone was stopped due to concern for possible toxicity.  He has seen [name] => bronchoscopy suggestive of ILD related to amiodarone.  He has been on chronic prednisone.  High resolution CT chest repeated in 6/20, again showing ILD, described at UIP pattern. He has started Ofev.  He needs to wear oxygen with exertion. - Continue Ofev.    - Continue pulmonary followup, due to see [name].   5. OSA: Continue CPAP.    6. CKD: Stage 3.  7. Pulmonary hypertension: Suspect group 3 PH due to lung disease (ILD).  8. DM2: He did not tolerate Farxiga   Followup in 1 month with NP/PA.   Signed, Loralie Champagne, MD  09/21/2019  [place] [address] [facility] [place] [address] [phone] (office) [phone] (fax)

## 2019-09-23 ENCOUNTER — Telehealth: Payer: Self-pay | Admitting: Internal Medicine

## 2019-09-23 NOTE — Telephone Encounter (Signed)
Called and spoke with pt in regards to message we received from [facility]. Provided pt with the phone number for him to call them regarding his OFEV. Nothing further needed.

## 2019-09-24 ENCOUNTER — Telehealth: Payer: Self-pay | Admitting: Internal Medicine

## 2019-09-24 NOTE — Telephone Encounter (Signed)
Rachael, do you have the number for pt as I cannot locate the number. Also, is there any way you can also look into this for [place] to see if you might be able to help pt out?

## 2019-09-25 ENCOUNTER — Telehealth: Payer: Self-pay | Admitting: Family Medicine

## 2019-09-25 NOTE — Chronic Care Management (AMB) (Signed)
  Chronic Care Management   Note  09/25/2019 Name: [name] MRN: [id number] DOB: 1960-11-05  Vincent Chandler is a 59 y.o. year old male who is a primary care patient of Ann Held, DO. I reached out to [name] by phone today in response to a referral sent by Vincent Chandler's PCP, Ann Held, DO.   Vincent Chandler was given information about Chronic Care Management services today including:  1. CCM service includes personalized support from designated clinical staff supervised by his physician, including individualized plan of care and coordination with other care providers 2. 24/7 contact phone numbers for assistance for urgent and routine care needs. 3. Service will only be billed when office clinical staff spend 20 minutes or more in a month to coordinate care. 4. Only one practitioner may furnish and bill the service in a calendar month. 5. The patient may stop CCM services at any time (effective at the end of the month) by phone call to the office staff.   Patient agreed to services and verbal consent obtained.   Follow up plan:   Raynicia Dukes [employer] Scheduler

## 2019-09-25 NOTE — Telephone Encounter (Signed)
Patient returned call. Provided patient information to schedule for Ofev. Called [facility] and patient's copay assistance is currently being reviewed and then they will contact patient to schedule shipment. Should be complete in the next business day. Advised patient to call office with any issues.  1:32 PM Beatriz Chancellor, CPhT

## 2019-09-25 NOTE — Telephone Encounter (Signed)
Patient should be filling Ofev through [facility], patient was enrolled into a grant due to [facility] requiring him to apply for Medicare LIS and patient had not done so.   Attempted to call patient, no answer or voicemail available. Ran test claim, Ofev is refill too soon until 10/16/19. [facility]v [facility] on 09/23/19. Will try patient again later to provide [facility] Phone number: [phone]  Ofev PAP# is [phone]  9:17 AM Beatriz Chancellor, CPhT

## 2019-09-26 ENCOUNTER — Other Ambulatory Visit (HOSPITAL_COMMUNITY): Payer: Self-pay | Admitting: Cardiology

## 2019-09-26 MED FILL — metFORMIN HCL 500 MG TABS: 500 | 30 days supply | Qty: 60 | Fill #1

## 2019-09-26 MED FILL — predniSONE 10 MG TABS: 10 | 30 days supply | Qty: 30 | Fill #6

## 2019-09-26 MED FILL — DIGOXIN 0.125 MG TABLET: 125 | 90 days supply | Qty: 45 | Fill #0

## 2019-09-26 MED FILL — ATORVASTATIN 40 MG TABLET: 40 | 30 days supply | Qty: 30 | Fill #5

## 2019-09-27 DIAGNOSIS — G4733 Obstructive sleep apnea (adult) (pediatric): Secondary | ICD-10-CM | POA: Diagnosis not present

## 2019-09-29 ENCOUNTER — Other Ambulatory Visit (HOSPITAL_COMMUNITY): Payer: Medicare HMO

## 2019-09-29 MED FILL — ENTRESTO 24 MG-26 MG TABLET: 24-26 | 30 days supply | Qty: 60 | Fill #0

## 2019-09-29 MED FILL — FUROSEMIDE 20 MG TABS: 20 | 20 days supply | Qty: 60 | Fill #0

## 2019-10-04 DIAGNOSIS — I2510 Atherosclerotic heart disease of native coronary artery without angina pectoris: Secondary | ICD-10-CM | POA: Diagnosis not present

## 2019-10-04 DIAGNOSIS — I509 Heart failure, unspecified: Secondary | ICD-10-CM | POA: Diagnosis not present

## 2019-10-06 ENCOUNTER — Telehealth: Payer: Self-pay | Admitting: Family Medicine

## 2019-10-06 NOTE — Telephone Encounter (Signed)
Patient states that he need for Dr Etter Sjogren to sign off on Diabetic Shoe order, patient was seen by [facility] and they requested diabetic shoes    Please advise

## 2019-10-08 ENCOUNTER — Other Ambulatory Visit: Payer: Self-pay | Admitting: Internal Medicine

## 2019-10-08 DIAGNOSIS — J849 Interstitial pulmonary disease, unspecified: Secondary | ICD-10-CM | POA: Diagnosis not present

## 2019-10-08 DIAGNOSIS — J9611 Chronic respiratory failure with hypoxia: Secondary | ICD-10-CM | POA: Diagnosis not present

## 2019-10-08 DIAGNOSIS — G4733 Obstructive sleep apnea (adult) (pediatric): Secondary | ICD-10-CM | POA: Diagnosis not present

## 2019-10-08 NOTE — Telephone Encounter (Signed)
Did they give him any paperwork or did [facility] fax [place] anything?  We need their notes and they usually have the order to sign for shoes

## 2019-10-08 NOTE — Telephone Encounter (Signed)
Okay to write Rx for diabetic shoes? Please advise

## 2019-10-09 ENCOUNTER — Other Ambulatory Visit: Payer: Self-pay | Admitting: Internal Medicine

## 2019-10-09 ENCOUNTER — Other Ambulatory Visit: Payer: Self-pay

## 2019-10-09 ENCOUNTER — Ambulatory Visit (INDEPENDENT_AMBULATORY_CARE_PROVIDER_SITE_OTHER): Payer: Medicare HMO | Admitting: Internal Medicine

## 2019-10-09 ENCOUNTER — Encounter: Payer: Self-pay | Admitting: Internal Medicine

## 2019-10-09 VITALS — BP 98/64 | HR 96 | Temp 97.7°F | Ht 70.0 in | Wt 178.6 lb

## 2019-10-09 DIAGNOSIS — E1122 Type 2 diabetes mellitus with diabetic chronic kidney disease: Secondary | ICD-10-CM | POA: Insufficient documentation

## 2019-10-09 DIAGNOSIS — E1142 Type 2 diabetes mellitus with diabetic polyneuropathy: Secondary | ICD-10-CM | POA: Diagnosis not present

## 2019-10-09 DIAGNOSIS — E1121 Type 2 diabetes mellitus with diabetic nephropathy: Secondary | ICD-10-CM

## 2019-10-09 DIAGNOSIS — E1165 Type 2 diabetes mellitus with hyperglycemia: Secondary | ICD-10-CM | POA: Diagnosis not present

## 2019-10-09 DIAGNOSIS — E1159 Type 2 diabetes mellitus with other circulatory complications: Secondary | ICD-10-CM | POA: Diagnosis not present

## 2019-10-09 DIAGNOSIS — N1832 Chronic kidney disease, stage 3b: Secondary | ICD-10-CM | POA: Insufficient documentation

## 2019-10-09 DIAGNOSIS — E119 Type 2 diabetes mellitus without complications: Secondary | ICD-10-CM | POA: Insufficient documentation

## 2019-10-09 LAB — POCT GLYCOSYLATED HEMOGLOBIN (HGB A1C): Hemoglobin A1C: 6.9 % — AB (ref 4.0–5.6)

## 2019-10-09 LAB — GLUCOSE, POCT (MANUAL RESULT ENTRY): POC Glucose: 131 mg/dl — AB (ref 70–99)

## 2019-10-09 LAB — MICROALBUMIN / CREATININE URINE RATIO
Creatinine,U: 163.9 mg/dL
Microalb Creat Ratio: 1 mg/g (ref 0.0–30.0)
Microalb, Ur: 1.6 mg/dL (ref 0.0–1.9)

## 2019-10-09 MED ORDER — JARDIANCE 10 MG PO TABS: 10.0000 mg | ORAL_TABLET | Freq: Every day | ORAL | 11 refills | Status: DC

## 2019-10-09 MED ORDER — METFORMIN HCL 500 MG PO TABS: 500.0000 mg | ORAL_TABLET | Freq: Two times a day (BID) | ORAL | 3 refills | Status: DC

## 2019-10-09 NOTE — Progress Notes (Signed)
Name: Vincent Chandler  Age/ Sex: 59 y.o., male   MRN/ DOB: [id number], [birth year]     PCP: Ann Held, DO   Reason for Endocrinology Evaluation: Type 2 Diabetes Mellitus  Initial Endocrine Consultative Visit:  02/25/2019    PATIENT IDENTIFIER: [name] is a 59 y.o. male with a past medical history of DM, ILD (diagnosed in 2019) , and CHF. The patient has followed with Endocrinology clinic since 02/25/2019 for consultative assistance with management of his diabetes.  DIABETIC HISTORY:  Vincent Chandler was diagnosed with T2 DM in 2011.  He was diagnosed with pulmonary fibrosis in 2019, he was started on prednisone at the time.  On his initial visit to our clinic he was on Tresiba and Metformin.  His hemoglobin A1c has ranged from 6.4% in 2019, peaking at 11.2% in 2020   SUBJECTIVE:   During the last visit (02/25/2019): A1c 11.2%, continued metformin and Tresiba  Today (10/09/2019): Vincent Chandler is here for a follow up on diabetes management. He has not been here since 02/2019.  He has not checked glucose in 2 months.  Otherwise, the patient has not required any recent emergency interventions for hypoglycemia and has not had recent hospitalizations secondary to hyper or hypoglycemic episodes.    Since his last visit here , he was seen by podiatry for a foot blister which has since healed, but has achy feet and occasional numbness   He is on oxygen at night for ILD. He continues to have SOB     HOME DIABETES REGIMEN:  Metformin 500 mg twice daily Tresiba 10 units daily- stopped it 3 months ago    Statin: Yes ACE-I/ARB: N0    METER DOWNLOAD SUMMARY: Did not bring     DIABETIC COMPLICATIONS: Microvascular complications:   CKD, neuropathy  Denies: Retinopathy   Last Eye Exam: Completed 2019  Macrovascular complications:    CAD (S/P CABG in 2014)  Denies: CVA, PVD   HISTORY:  Past Medical History:  Past Medical History:  Diagnosis Date  . AICD  (automatic cardioverter/defibrillator) present   . Anginal pain ([place])   . Anxiety   . Asthma   . Atrial fibrillation-postoperative 11/28/2012  . Cardiomyopathy    alcohol use related  . CHF (congestive heart failure) ([place])   . Chronic back pain   . Coronary artery disease    drug eluting stent RCA 2005-EF 30%- s/p CABG x 4; 2/4 patent grafts with SVG-PLOM and SVG-RCA system totally occluded. There are collaterals from the LAD system to the RCA and the RCA is totally occluded proximally. There are no collaterals to the LCx territory. He then underwent successful PCI of the mid left circumflex artery with overlapping Synergy drug-eluting stents  . Depression    pt denies  . Diabetes mellitus type II dx'd in the 1990's  . GERD (gastroesophageal reflux disease)    yrs ago  . H/O hiatal hernia   . Hemorrhoids   . History of hypogonadism   . History of kidney stones   . Hyperlipidemia   . Hypertension   . OSA on CPAP    "mask is broken; working on getting a new one" (01/15/2015; 10/03/2017)  . Pneumonia    "3-4 times" (10/03/2017)  . Urinary incontinence    Past Surgical History:  Past Surgical History:  Procedure Laterality Date  . A-FLUTTER ABLATION N/A 09/12/2017   Procedure: A-FLUTTER ABLATION;  Surgeon: Deboraha Sprang, MD;  Location: [place] CV LAB;  Service: Cardiovascular;  Laterality: N/A;  . CARDIAC DEFIBRILLATOR PLACEMENT  01/15/2015  . CARDIOVERSION N/A 08/10/2017   Procedure: CARDIOVERSION;  Surgeon: Larey Dresser, MD;  Location: [facility] ENDOSCOPY;  Service: Cardiovascular;  Laterality: N/A;  . CORONARY ANGIOPLASTY WITH STENT PLACEMENT  ~ 2003  . CORONARY ARTERY BYPASS GRAFT N/A 08/15/2012   Procedure: CORONARY ARTERY BYPASS GRAFTING (CABG);  Surgeon: Ivin Poot, MD;  Location: [place];  Service: Open Heart Surgery;  Laterality: N/A;  Coronary Artery Bypass Grafting Times Four Using Left Internal Mammary Artery and Right Saphenous Leg Vein Harvested Endoscopically  . EP  IMPLANTABLE DEVICE N/A 01/15/2015   Procedure: ICD Implant;  Surgeon: Deboraha Sprang, MD;  Location: [place] CV LAB;  Service: Cardiovascular;  Laterality: N/A;  . LEFT HEART CATHETERIZATION WITH CORONARY ANGIOGRAM N/A 08/08/2012   Procedure: LEFT HEART CATHETERIZATION WITH CORONARY ANGIOGRAM;  Surgeon: Peter M Martinique, MD;  Location: [facility] CATH LAB;  Service: Cardiovascular;  Laterality: N/A;  . LEFT HEART CATHETERIZATION WITH CORONARY/GRAFT ANGIOGRAM N/A 07/21/2014   Procedure: LEFT HEART CATHETERIZATION WITH Beatrix Fetters;  Surgeon: Larey Dresser, MD;  Location: [facility] CATH LAB;  Service: Cardiovascular;  Laterality: N/A;  . MULTIPLE EXTRACTIONS WITH ALVEOLOPLASTY N/A 10/04/2017   Procedure: Extraction of tooth #'s 5 and 14 with alveoloplasty and gross debridement of remaining teeth;  Surgeon: [name], DDS;  Location: [place];  Service: Oral Surgery;  Laterality: N/A;  . PERCUTANEOUS CORONARY STENT INTERVENTION (PCI-S)  07/21/2014   Procedure: PERCUTANEOUS CORONARY STENT INTERVENTION (PCI-S);  Surgeon: Larey Dresser, MD;  Location: [facility] CATH LAB;  Service: Cardiovascular;;  . REFRACTIVE SURGERY Bilateral 1990's  . RIGHT HEART CATH N/A 10/03/2017   Procedure: RIGHT HEART CATH;  Surgeon: Larey Dresser, MD;  Location: [place] CV LAB;  Service: Cardiovascular;  Laterality: N/A;  . RIGHT HEART CATH N/A 12/11/2018   Procedure: RIGHT HEART CATH;  Surgeon: Larey Dresser, MD;  Location: [place] CV LAB;  Service: Cardiovascular;  Laterality: N/A;  . TEE WITHOUT CARDIOVERSION N/A 08/10/2017   Procedure: TRANSESOPHAGEAL ECHOCARDIOGRAM (TEE);  Surgeon: Larey Dresser, MD;  Location: [facility] ENDOSCOPY;  Service: Cardiovascular;  Laterality: N/A;  . TONSILLECTOMY  ~ 1970  . VIDEO BRONCHOSCOPY Bilateral 11/28/2017   Procedure: VIDEO BRONCHOSCOPY WITHOUT FLUORO;  Surgeon: Brand Males, MD;  Location: [facility] ENDOSCOPY;  Service: Endoscopy;  Laterality: Bilateral;    Social History:  reports  that he has never smoked. His smokeless tobacco use includes chew. He reports current alcohol use of about 3.0 standard drinks of alcohol per week. He reports that he does not use drugs. Family History:  Family History  Problem Relation Age of Onset  . Hypertension Mother   . Diabetes Mother   . Hypertension Father   . Diabetes Father   . Hyperlipidemia Father      HOME MEDICATIONS: Allergies as of 10/09/2019   No Known Allergies     Medication List       Accurate as of October 09, 2019  1:06 PM. If you have any questions, ask your nurse or doctor.        Accu-Chek FastClix Lancets Misc Use as directed once a day.  Dx code: E11.9   Accu-Chek Guide w/Device Kit 1 each by Does not apply route daily. DX Code: E11.9   albuterol 108 (90 Base) MCG/ACT inhaler Commonly known as: VENTOLIN HFA Inhale 2 puffs into the lungs every 6 (six) hours as needed for wheezing or shortness of breath.  albuterol 0.63 MG/3ML nebulizer solution Commonly known as: ACCUNEB Take 3 mLs (0.63 mg total) by nebulization every 6 (six) hours as needed for wheezing.   atorvastatin 40 MG tablet Commonly known as: LIPITOR TAKE 1 TABLET (40 MG TOTAL) BY MOUTH AT BEDTIME.   blood glucose meter kit and supplies Dispense based on patient and insurance preference. Use as directed once a day. Dx Code E11.9.   carvedilol 12.5 MG tablet Commonly known as: COREG Take 12.5 mg by mouth 2 (two) times daily with a meal.   digoxin 0.125 MG tablet Commonly known as: LANOXIN TAKE 1/2 TABLET BY MOUTH DAILY.   Eliquis 5 MG Tabs tablet Generic drug: apixaban TAKE 1 TABLET BY MOUTH 2 TIMES DAILY.   furosemide 20 MG tablet Commonly known as: LASIX Take 2 tablets (40 mg total) by mouth every morning AND 1 tablet (20 mg total) every evening.   glucose blood test strip Commonly known as: Accu-Chek Guide Use as directed once a day.  Dx code: E11.9   guaiFENesin 600 MG 12 hr tablet Commonly known as: MUCINEX Take 1  tablet (600 mg total) by mouth 2 (two) times daily as needed for to loosen phlegm.   metFORMIN 500 MG tablet Commonly known as: GLUCOPHAGE Take 1 tablet (500 mg total) by mouth 2 (two) times daily with a meal. What changed: See the new instructions. Changed by: Dorita Sciara, MD   mometasone-formoterol 200-5 MCG/ACT Aero Commonly known as: DULERA Inhale 2 puffs into the lungs 2 (two) times a day.   nitroGLYCERIN 0.4 MG SL tablet Commonly known as: NITROSTAT Place 1 tablet (0.4 mg total) under the tongue every 5 (five) minutes as needed for chest pain.   Ofev 150 MG Caps Generic drug: Nintedanib TAKE 1 CAPSULE BY MOUTH TWICE DAILY WITH FOOD. CALL [phone] FOR REFILLS   OXYGEN Inhale into the lungs. 3L prn   predniSONE 10 MG tablet Commonly known as: DELTASONE Take 1 tablet (10 mg total) by mouth daily with breakfast.   sacubitril-valsartan 24-26 MG Commonly known as: ENTRESTO Take 1 tablet by mouth 2 (two) times daily.   spironolactone 25 MG tablet Commonly known as: ALDACTONE Take 1 tablet (25 mg total) by mouth daily.        OBJECTIVE:   Vital Signs: BP 98/64 (BP Location: Left Arm, Patient Position: Sitting, Cuff Size: Normal)   Pulse 96   Temp 97.7 F (36.5 C)   Ht _0  (1.778 m)   Wt 178 lb 9.6 oz (81 kg)   SpO2 90%   BMI 25.63 kg/m   Wt Readings from Last 3 Encounters:  10/09/19 178 lb 9.6 oz (81 kg)  09/19/19 182 lb 6.4 oz (82.7 kg)  09/18/19 181 lb (82.1 kg)     Exam: General: Pt appears well and is in NAD  Lungs: Clear with good BS bilat with no rales, rhonchi, or wheezes  Heart: RRR with normal S1 and S2 and no gallops; no murmurs; no rub  Extremities: No pretibial edema. No tremor. Normal strength and motion throughout. See detailed diabetic foot exam below.  Neuro: MS is good with appropriate affect, pt is alert and Ox3    DM foot exam 10/09/2019 The skin of the feet is without sores or ulcerations, pt with callous formation at  the 5th MT head (plantar surface) The pedal pulses are 2+ on right and 2+ on left. The sensation is decreased to a screening 5.07, 10 gram monofilament bilaterally    DATA REVIEWED:  Lab Results  Component Value Date   HGBA1C 6.9 (A) 10/09/2019   HGBA1C 11.2 (H) 02/07/2019   HGBA1C 6.4 11/20/2017   Lab Results  Component Value Date   MICROALBUR 1.6 10/09/2019   LDLCALC 68 06/19/2019   CREATININE 2.19 (H) 09/18/2019   Lab Results  Component Value Date   MICRALBCREAT 1.0 10/09/2019     Lab Results  Component Value Date   CHOL 168 06/19/2019   HDL 49 06/19/2019   LDLCALC 68 06/19/2019   LDLDIRECT 69.0 11/20/2017   TRIG 255 (H) 06/19/2019   CHOLHDL 3.4 06/19/2019       In-Office Bg 131 mg/dL   ASSESSMENT / PLAN / RECOMMENDATIONS:   1) Type 2 Diabetes Mellitus, optimally controlled, With  neuropathic, CKD III and macrovascular complications - Most recent A1c of 6.9 %. Goal A1c < 7.0 %.     -Patient had stopped the insulin a few months ago as he ran out of it.  I was going to start him on SGLT-2 inhibitor due to extensive cardiac history  but given his low GFR we will hold off on this at this time, if GFR improves to > 45  Will reconsider.  - In the meantime , will continue with metformin, if his GFR remains below 35 , will consider stopping that as well.    MEDICATIONS: - Continue Metformin 500 mg TWice daily    EDUCATION / INSTRUCTIONS:  BG monitoring instructions: Patient is instructed to check his blood sugars 1 times a day, fasting.  Call [facility] if: BG persistently < 70 or > 300. . I reviewed the Rule of 15 for the treatment of hypoglycemia in detail with the patient. Literature supplied.   2) Diabetic complications:   Eye: Unknown to have  diabetic retinopathy.  I have encouraged him to have an eye exam  Neuro/ Feet: Does have known diabetic peripheral neuropathy .   Renal: Patient does have known baseline CKD. He   is  on an  ACEI/ARB at present. Up to date on microalbumin uria.     F/U in 4 months    Signed electronically by: Mack Guise, MD  [facility] Endocrinology  [facility] [place], [place] [address] Phone: [phone] FAX: [phone]   CC: Ann Held, DO [address] [place] [address] Phone: [phone]  Fax: [phone]  Return to Endocrinology clinic as below: Future Appointments  Date Time Provider [place]  10/10/2019  2:15 PM MC-SCREENING MC-SDSC None  10/13/2019  2:00 PM MC-CPX LAB MC-CPX MCCPX  10/20/2019  3:00 PM MC-HVSC PA/NP MC-HVSC None  10/20/2019  3:30 PM [place] [facility] LBRE-CVRES None  10/21/2019  2:00 PM LBPC-SW CCM PHARMACIST LBPC-SW PEC  12/19/2019  7:25 AM [facility] DEVICE REMOTES [facility]  02/18/2020  1:40 PM Shamleffer, Melanie Crazier, MD LBPC-LBENDO None  03/19/2020  7:25 AM [facility] DEVICE REMOTES [facility]  06/18/2020  7:25 AM [facility] DEVICE REMOTES [facility]  09/17/2020  7:25 AM [facility] DEVICE REMOTES [facility]  12/17/2020  7:25 AM [facility] DEVICE REMOTES [facility]

## 2019-10-09 NOTE — Patient Instructions (Addendum)
-  Continue Metformin 500 mg TWice daily

## 2019-10-10 ENCOUNTER — Inpatient Hospital Stay (HOSPITAL_COMMUNITY): Admission: RE | Admit: 2019-10-10 | Payer: Medicare HMO | Source: Ambulatory Visit

## 2019-10-13 ENCOUNTER — Encounter (HOSPITAL_COMMUNITY): Payer: Medicare HMO

## 2019-10-14 NOTE — Telephone Encounter (Signed)
Attempted to call patient. Pt doesn't have VM set up. We have not received forms

## 2019-10-16 ENCOUNTER — Other Ambulatory Visit: Payer: Self-pay

## 2019-10-16 ENCOUNTER — Telehealth: Payer: Self-pay | Admitting: Family Medicine

## 2019-10-16 NOTE — Progress Notes (Signed)
Trying to contact patient so I can reschedule his CCM appointment on April 20th @ 2pm that was canceled. CPP has an scheduled appointment. Please have patient contact, Raynicia @ [phone]. Thx     Raynicia Dukes [employer] Scheduler

## 2019-10-20 ENCOUNTER — Encounter (HOSPITAL_COMMUNITY): Payer: Medicare HMO

## 2019-10-21 ENCOUNTER — Telehealth: Payer: Medicare HMO

## 2019-10-21 MED FILL — metFORMIN HCL 500 MG TABS: 500 | 30 days supply | Qty: 60 | Fill #2

## 2019-10-21 MED FILL — ATORVASTATIN 40 MG TABLET: 40 | 30 days supply | Qty: 30 | Fill #6

## 2019-10-21 MED FILL — FUROSEMIDE 20 MG TABS: 20 | 20 days supply | Qty: 60 | Fill #1

## 2019-10-22 MED FILL — ENTRESTO 24 MG-26 MG TABLET: 24-26 | 30 days supply | Qty: 60 | Fill #1

## 2019-10-23 ENCOUNTER — Telehealth: Payer: Self-pay | Admitting: Internal Medicine

## 2019-10-23 NOTE — Telephone Encounter (Signed)
ATC patient. He did not answer and his VM is not setup.

## 2019-10-24 NOTE — Telephone Encounter (Signed)
ATC pt, there was no answer and I could not leave a message due to his VM not being set up. Will try back.

## 2019-10-27 NOTE — Telephone Encounter (Signed)
ATC pt, there was no answer and I could not leave a message due to his VM not being set up. We have attempted to contact pt several times with no success or call back from pt. Per triage protocol, message will be closed.

## 2019-10-28 ENCOUNTER — Other Ambulatory Visit (HOSPITAL_COMMUNITY): Payer: Self-pay | Admitting: Cardiology

## 2019-10-28 DIAGNOSIS — G4733 Obstructive sleep apnea (adult) (pediatric): Secondary | ICD-10-CM | POA: Diagnosis not present

## 2019-10-28 MED FILL — predniSONE 10 MG TABS: 10 | 90 days supply | Qty: 90 | Fill #0

## 2019-10-28 MED FILL — SPIRONOLACTONE 25 MG TABS: 25 | 90 days supply | Qty: 90 | Fill #0

## 2019-10-31 ENCOUNTER — Encounter: Payer: Self-pay | Admitting: Podiatry

## 2019-10-31 ENCOUNTER — Ambulatory Visit: Payer: Medicare HMO | Admitting: Podiatry

## 2019-10-31 ENCOUNTER — Other Ambulatory Visit: Payer: Self-pay

## 2019-10-31 DIAGNOSIS — M79671 Pain in right foot: Secondary | ICD-10-CM

## 2019-10-31 DIAGNOSIS — E1165 Type 2 diabetes mellitus with hyperglycemia: Secondary | ICD-10-CM

## 2019-10-31 DIAGNOSIS — L97512 Non-pressure chronic ulcer of other part of right foot with fat layer exposed: Secondary | ICD-10-CM

## 2019-10-31 NOTE — Progress Notes (Signed)
Subjective:  Patient ID: Vincent Chandler, male    DOB: 01-28-1961,  MRN: [id number]  Chief Complaint  Patient presents with  . Foot Ulcer    pt is here for ulcer of the right foot, pt states that he is concerned that it is turning black as well as some redness.    59 y.o. male presents for wound care.  Patient presents with concern of right fifth digit lateral ulceration.  Patient states that this started with a little blister formation likely due to rubbing against the shoes.  Patient states that he was starting to feel sore and given that his neuropathy is in full effect to the bilateral lower extremity he usually does not feel soreness.  Patient states that there is some redness around it.  He denies any other acute complaints.  He would like to make sure that there is no underlying wound associated with this.   Review of Systems: Negative except as noted in the HPI. Denies N/V/F/Ch.  Past Medical History:  Diagnosis Date  . AICD (automatic cardioverter/defibrillator) present   . Anginal pain ([place])   . Anxiety   . Asthma   . Atrial fibrillation-postoperative 11/28/2012  . Cardiomyopathy    alcohol use related  . CHF (congestive heart failure) ([place])   . Chronic back pain   . Coronary artery disease    drug eluting stent RCA 2005-EF 30%- s/p CABG x 4; 2/4 patent grafts with SVG-PLOM and SVG-RCA system totally occluded. There are collaterals from the LAD system to the RCA and the RCA is totally occluded proximally. There are no collaterals to the LCx territory. He then underwent successful PCI of the mid left circumflex artery with overlapping Synergy drug-eluting stents  . Depression    pt denies  . Diabetes mellitus type II dx'd in the 1990's  . GERD (gastroesophageal reflux disease)    yrs ago  . H/O hiatal hernia   . Hemorrhoids   . History of hypogonadism   . History of kidney stones   . Hyperlipidemia   . Hypertension   . OSA on CPAP    "mask is broken; working on  getting a new one" (01/15/2015; 10/03/2017)  . Pneumonia    "3-4 times" (10/03/2017)  . Urinary incontinence     Current Outpatient Medications:  .  ACCU-CHEK FASTCLIX LANCETS MISC, Use as directed once a day.  Dx code: E11.9, Disp: 100 each, Rfl: 1 .  albuterol (ACCUNEB) 0.63 MG/3ML nebulizer solution, Take 3 mLs (0.63 mg total) by nebulization every 6 (six) hours as needed for wheezing., Disp: 75 mL, Rfl: 12 .  albuterol (VENTOLIN HFA) 108 (90 Base) MCG/ACT inhaler, Inhale 2 puffs into the lungs every 6 (six) hours as needed for wheezing or shortness of breath., Disp: , Rfl:  .  atorvastatin (LIPITOR) 40 MG tablet, TAKE 1 TABLET (40 MG TOTAL) BY MOUTH AT BEDTIME., Disp: 90 tablet, Rfl: 3 .  blood glucose meter kit and supplies, Dispense based on patient and insurance preference. Use as directed once a day. Dx Code E11.9., Disp: 1 each, Rfl: 0 .  Blood Glucose Monitoring Suppl (ACCU-CHEK GUIDE) w/Device KIT, 1 each by Does not apply route daily. DX Code: E11.9, Disp: 1 kit, Rfl: 0 .  carvedilol (COREG) 12.5 MG tablet, Take 12.5 mg by mouth 2 (two) times daily with a meal., Disp: , Rfl:  .  digoxin (LANOXIN) 0.125 MG tablet, TAKE 1/2 TABLET BY MOUTH DAILY., Disp: 45 tablet, Rfl: 3 .  ELIQUIS  5 MG TABS tablet, TAKE 1 TABLET BY MOUTH 2 TIMES DAILY., Disp: 180 tablet, Rfl: 3 .  furosemide (LASIX) 20 MG tablet, Take 2 tablets (40 mg total) by mouth every morning AND 1 tablet (20 mg total) every evening., Disp: 60 tablet, Rfl: 5 .  glucose blood (ACCU-CHEK GUIDE) test strip, Use as directed once a day.  Dx code: E11.9, Disp: 100 each, Rfl: 1 .  guaiFENesin (MUCINEX) 600 MG 12 hr tablet, Take 1 tablet (600 mg total) by mouth 2 (two) times daily as needed for to loosen phlegm., Disp: , Rfl:  .  metFORMIN (GLUCOPHAGE) 500 MG tablet, Take 1 tablet (500 mg total) by mouth 2 (two) times daily with a meal., Disp: 180 tablet, Rfl: 3 .  mometasone-formoterol (DULERA) 200-5 MCG/ACT AERO, Inhale 2 puffs into the  lungs 2 (two) times a day., Disp: 2 Inhaler, Rfl: 0 .  nitroGLYCERIN (NITROSTAT) 0.4 MG SL tablet, Place 1 tablet (0.4 mg total) under the tongue every 5 (five) minutes as needed for chest pain., Disp: 25 tablet, Rfl: 3 .  OFEV 150 MG CAPS, TAKE 1 CAPSULE BY MOUTH TWICE DAILY WITH FOOD. CALL [phone] FOR REFILLS, Disp: 60 capsule, Rfl: 5 .  OXYGEN, Inhale into the lungs. 3L prn, Disp: , Rfl:  .  predniSONE (DELTASONE) 10 MG tablet, TAKE 1 TABLET (10 MG TOTAL) BY MOUTH DAILY WITH BREAKFAST., Disp: 90 tablet, Rfl: 1 .  sacubitril-valsartan (ENTRESTO) 24-26 MG, Take 1 tablet by mouth 2 (two) times daily., Disp: 60 tablet, Rfl: 5 .  spironolactone (ALDACTONE) 25 MG tablet, Take 1 tablet (25 mg total) by mouth daily., Disp: 90 tablet, Rfl: 3  Social History   Tobacco Use  Smoking Status Never Smoker  Smokeless Tobacco Current User  . Types: Chew    No Known Allergies Objective:  There were no vitals filed for this visit. There is no height or weight on file to calculate BMI. Constitutional Well developed. Well nourished.  Vascular Dorsalis pedis pulses palpable bilaterally. Posterior tibial pulses palpable bilaterally. Capillary refill normal to all digits.  No cyanosis or clubbing noted. Pedal hair growth normal.  Neurologic Normal speech. Oriented to person, place, and time. Protective sensation absent  Dermatologic Wound Location: Right lateral fifth metatarsal head.  No malodor present no clinical signs of infection aside from mild erythema present.  No purulent drainage noted. Wound Base: Mixed Granular/Fibrotic Peri-wound: Calloused Exudate: Scant/small amount Serosanguinous exudate Wound Measurements: -See below  Orthopedic: No pain to palpation either foot.   Radiographs: None Assessment:   1. Uncontrolled type 2 diabetes mellitus with hyperglycemia ([place])   2. Ulcer of right foot with fat layer exposed ([place])   3. Pain in right foot    Plan:  Patient was evaluated  and treated and all questions answered.  Ulcer right fifth metatarsal head with fat layer exposed -Debridement as below. -Dressed with Betadine wet-to-dry, DSD. -I instructed him to go back into a surgical shoe to completely offload the right fifth metatarsal head.  Patient states understanding. -Doxycycline was sent for skin and soft tissue prophylaxis.  Procedure: Excisional Debridement of Wound Tool: Sharp chisel blade/tissue nipper Rationale: Removal of non-viable soft tissue from the wound to promote healing.  Anesthesia: none Pre-Debridement Wound Measurements: 1.3 cm x 1 cm x 0.3 cm  Post-Debridement Wound Measurements: 1.3 cm x 1 cm x 0.3 cm  Type of Debridement: Sharp Excisional Tissue Removed: Non-viable soft tissue Blood loss: Minimal (<50cc) Depth of Debridement: subcutaneous tissue. Technique: Sharp excisional  debridement to bleeding, viable wound base.  Wound Progress: Today will be the first debridement.  We will continue to monitor progress. Site healing conversation 7 Dressing: Dry, sterile, compression dressing. Disposition: Patient tolerated procedure well. Patient to return in 1 week for follow-up.  No follow-ups on file.

## 2019-11-03 DIAGNOSIS — I2510 Atherosclerotic heart disease of native coronary artery without angina pectoris: Secondary | ICD-10-CM | POA: Diagnosis not present

## 2019-11-03 DIAGNOSIS — I509 Heart failure, unspecified: Secondary | ICD-10-CM | POA: Diagnosis not present

## 2019-11-04 MED FILL — ELIQUIS 5 MG TABLET: 5 | 90 days supply | Qty: 180 | Fill #1

## 2019-11-07 DIAGNOSIS — J9611 Chronic respiratory failure with hypoxia: Secondary | ICD-10-CM | POA: Diagnosis not present

## 2019-11-07 DIAGNOSIS — J849 Interstitial pulmonary disease, unspecified: Secondary | ICD-10-CM | POA: Diagnosis not present

## 2019-11-07 DIAGNOSIS — G4733 Obstructive sleep apnea (adult) (pediatric): Secondary | ICD-10-CM | POA: Diagnosis not present

## 2019-11-11 IMAGING — DX DG CHEST 2V
2 series · 2 of 2 positions shown · non-contrast
Comparison: 01/16/2015

CLINICAL DATA: Lower respiratory infection

EXAM:
CHEST  2 VIEW

[chest pa]
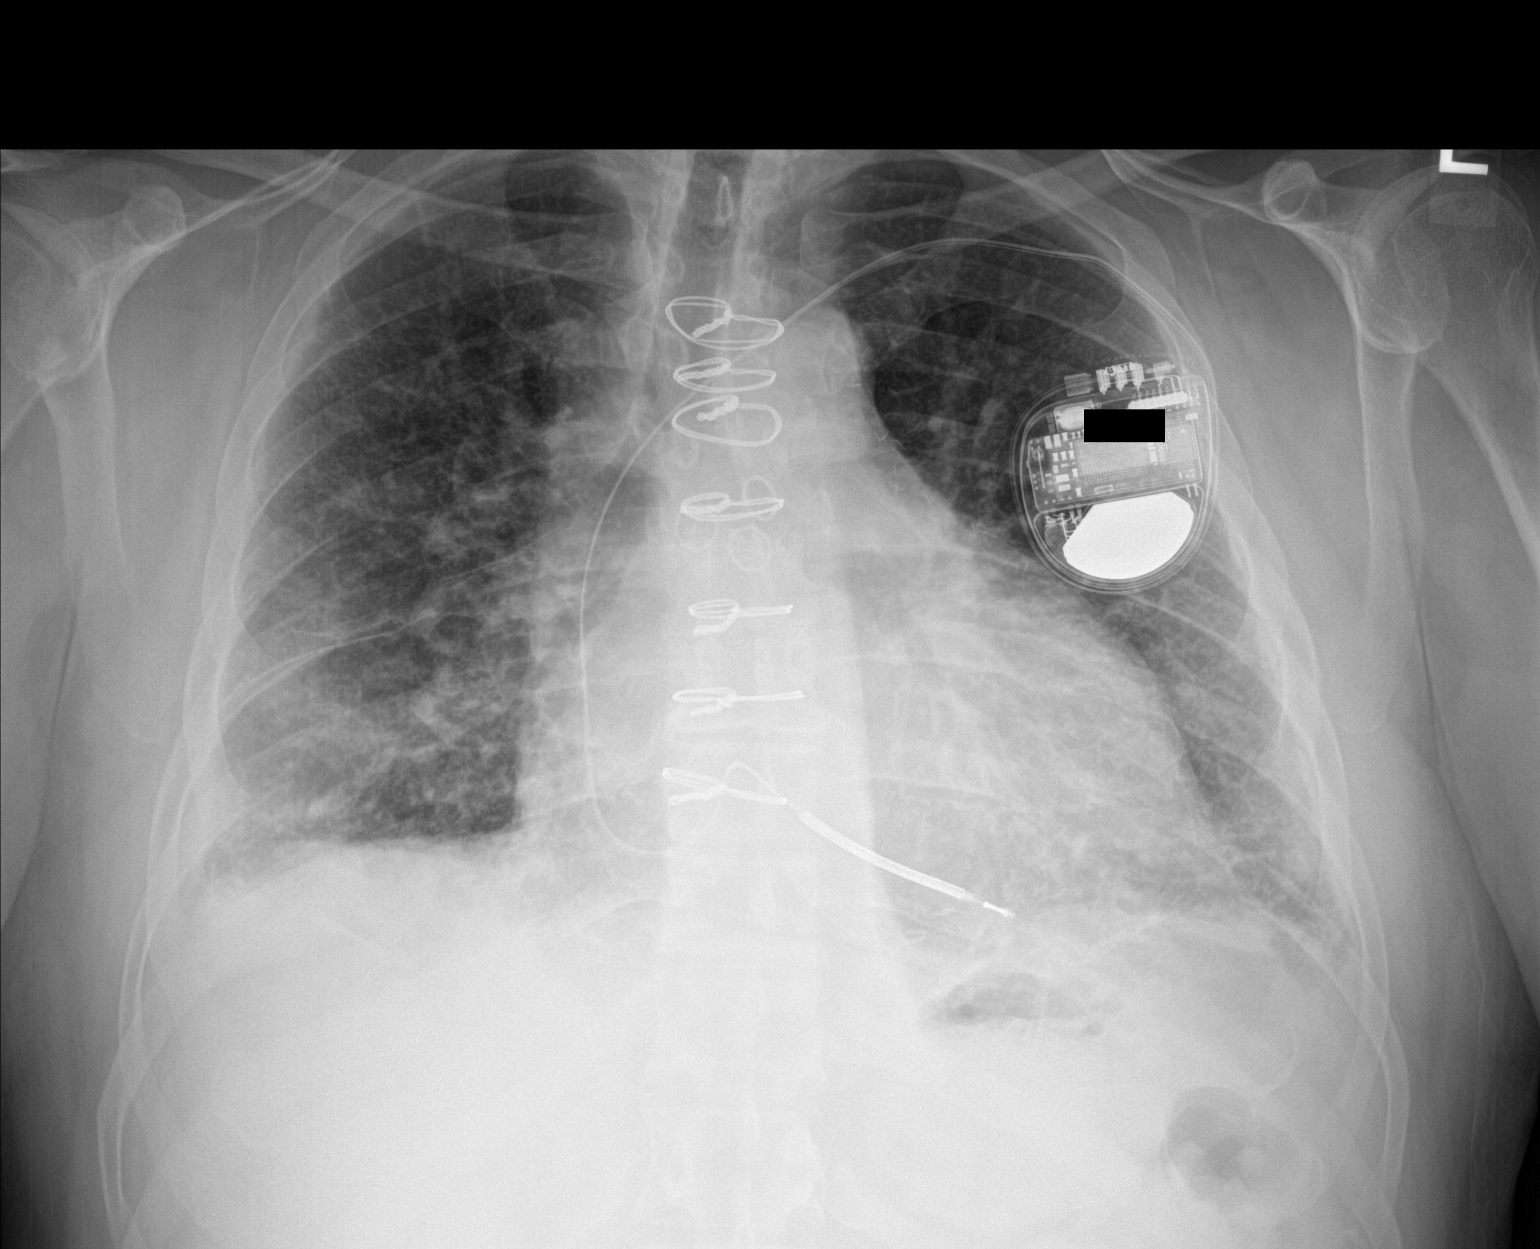

[chest lat]
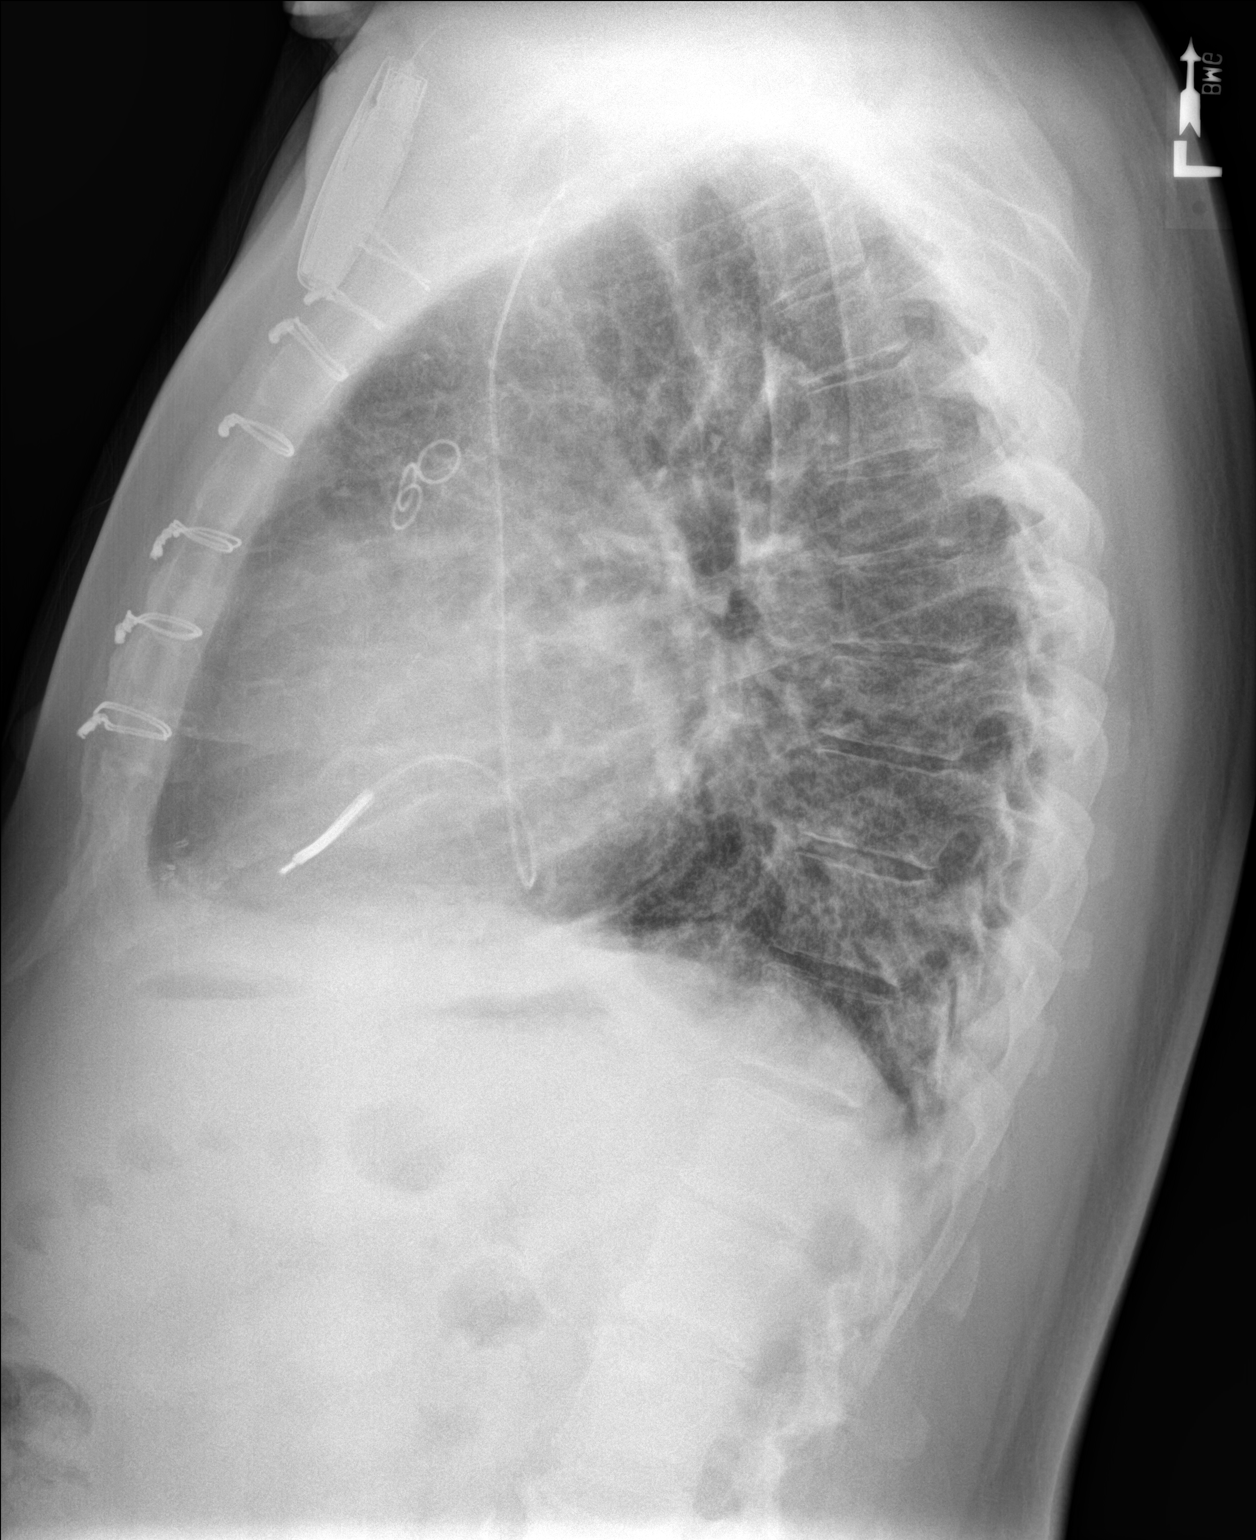

[2 of 2 positions shown; findings below may reference images not displayed]

FINDINGS: Cardiac shadow is enlarged. Postsurgical changes are again seen.
Defibrillator is again noted. The lungs are well aerated
bilaterally. Some mild chronic interstitial changes are seen with
mild bibasilar atelectatic changes. No focal confluent infiltrate or
sizable effusion is seen. No bony abnormality is seen.
IMPRESSION: Mild bibasilar atelectatic changes.

## 2019-11-13 IMAGING — DX DG CHEST 1V PORT
1 series · 1 of 1 positions shown · non-contrast
Comparison: 08/07/2017

CLINICAL DATA: Central line placement

EXAM:
PORTABLE CHEST 1 VIEW

[chest]
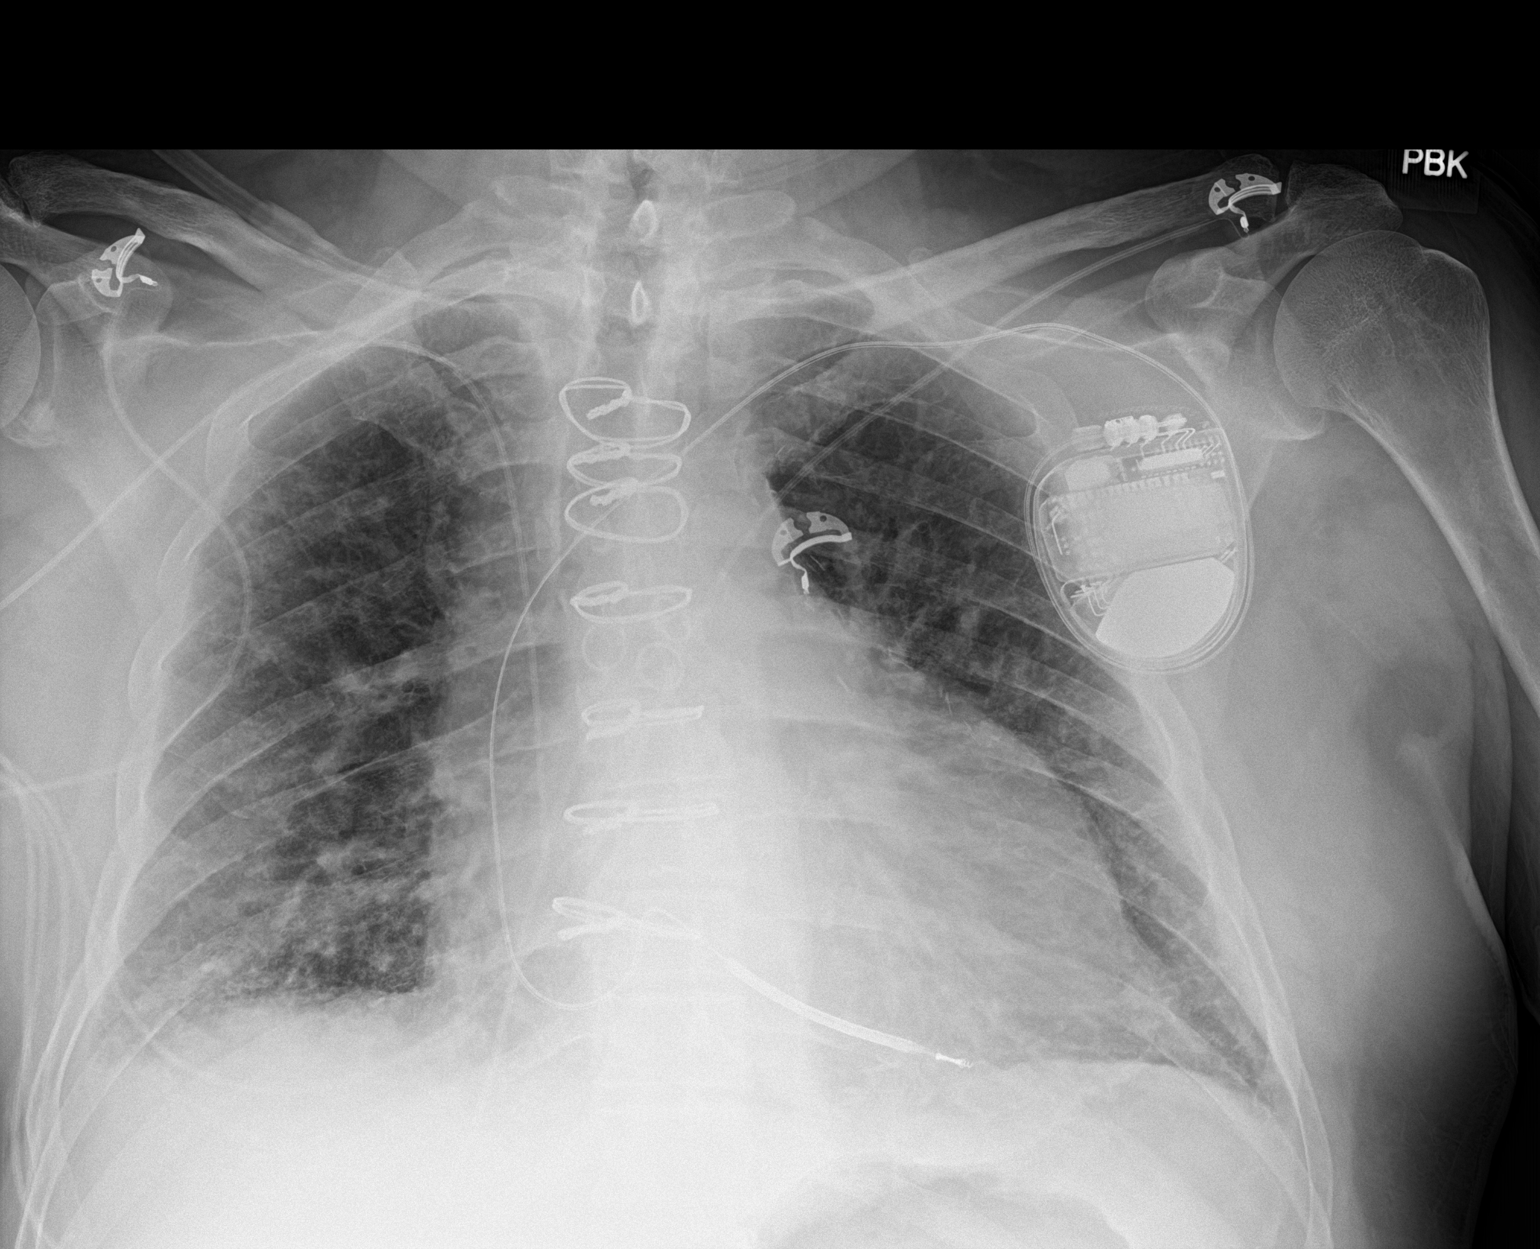

[1 of 1 positions shown; findings below may reference images not displayed]

FINDINGS: Interval placement of right-sided PICC, with the tip in the right
atrium.

Cardiac enlargement with CABG changes. AICD unchanged. Vascular
congestion and mild edema. Small pleural effusions. Mild bibasilar
atelectasis
IMPRESSION: PICC tip in the right atrium

Congestive heart failure with mild interval improvement

## 2019-11-14 ENCOUNTER — Telehealth: Payer: Self-pay | Admitting: Pharmacist

## 2019-11-14 ENCOUNTER — Telehealth: Payer: Medicare HMO

## 2019-11-14 NOTE — Telephone Encounter (Signed)
  Chronic Care Management   Outreach Note  11/14/2019 Name: ZIAIR PENSON MRN: [id number] DOB: 04/30/1961  Referred by: Ann Held, DO Reason for referral : No chief complaint on file.   A second unsuccessful telephone outreach was attempted today. The patient was referred to pharmacist for assistance with care management and care coordination.   I called at appt time of 1:00pm and again at 1:14pm with no answer or ability to leave VM.   Follow Up Plan:  -Will see if scheduling team can have patient rescheduled for Initial CCM Visit  Melvenia Beam Damiya Sandefur, PharmD Clinical Pharmacist [facility] at [facility] [phone]

## 2019-11-14 NOTE — Chronic Care Management (AMB) (Deleted)
Chronic Care Management Pharmacy  Name: Vincent Chandler  MRN: [id number] DOB: 1960-08-04  Chief Complaint/ HPI  Vincent Chandler,  59 y.o. , male presents for their Initial CCM visit with the clinical pharmacist via telephone due to COVID-19 Pandemic.  PCP : Ann Held, DO  Their chronic conditions include: {CHL AMB CHRONIC MEDICAL [phone]}  Office Visits: 02/11/19: Visit w/ Dr. Etter Sjogren - Stopped metformin due to diarrhea. Start Tresiba 10 units daily. Wound to right foot. Referral to podiatry and endocrinology  Consult Visit: 10/31/19: Podiatry visit w/ Dr. Posey Pronto - Ulcer on right foot with concern that it is turning black. Debridement of R fifth metatarsal head with fat layer exposed. Dressed with betadine wet-to dry. Instructed to go back into surgical shoe. Doxycycline sent for skin and soft tissue prophylaxis.   10/23/19: Pulmonary messages - Unable to reach patient for Ofev?  10/09/19: Endo visit w/ Dr. Kelton Pillar - A1c down to 6.9%. Considered starting SGLT2 noting extensive cardiac history, but held off due to low GFR. If GFR improved to >45 will reconsider. In meantime will continue metformin. If GFR <35 will consider stopped that as well. RTC 4 months.  09/19/19: Cardio visit w/ Dr. Marigene Ehlers - Decrease entresto to 24-26 due to low BP. Increase furosemide to 67m am, 297mPM. RTC 1 month to see PA/NP.  09/18/19: Cardio Research Study Visit  Medications: Outpatient Encounter Medications as of 11/14/2019  Medication Sig  . ACCU-CHEK FASTCLIX LANCETS MISC Use as directed once a day.  Dx code: E11.9  . albuterol (ACCUNEB) 0.63 MG/3ML nebulizer solution Take 3 mLs (0.63 mg total) by nebulization every 6 (six) hours as needed for wheezing.  . Marland Kitchenlbuterol (VENTOLIN HFA) 108 (90 Base) MCG/ACT inhaler Inhale 2 puffs into the lungs every 6 (six) hours as needed for wheezing or shortness of breath.  . Marland Kitchentorvastatin (LIPITOR) 40 MG tablet TAKE 1 TABLET (40 MG TOTAL) BY MOUTH AT  BEDTIME.  . blood glucose meter kit and supplies Dispense based on patient and insurance preference. Use as directed once a day. Dx Code E11.9.  . Blood Glucose Monitoring Suppl (ACCU-CHEK GUIDE) w/Device KIT 1 each by Does not apply route daily. DX Code: E11.9  . carvedilol (COREG) 12.5 MG tablet Take 12.5 mg by mouth 2 (two) times daily with a meal.  . digoxin (LANOXIN) 0.125 MG tablet TAKE 1/2 TABLET BY MOUTH DAILY.  . Marland KitchenLIQUIS 5 MG TABS tablet TAKE 1 TABLET BY MOUTH 2 TIMES DAILY.  . furosemide (LASIX) 20 MG tablet Take 2 tablets (40 mg total) by mouth every morning AND 1 tablet (20 mg total) every evening.  . Marland Kitchenlucose blood (ACCU-CHEK GUIDE) test strip Use as directed once a day.  Dx code: E11.9  . guaiFENesin (MUCINEX) 600 MG 12 hr tablet Take 1 tablet (600 mg total) by mouth 2 (two) times daily as needed for to loosen phlegm.  . metFORMIN (GLUCOPHAGE) 500 MG tablet Take 1 tablet (500 mg total) by mouth 2 (two) times daily with a meal.  . mometasone-formoterol (DULERA) 200-5 MCG/ACT AERO Inhale 2 puffs into the lungs 2 (two) times a day.  . nitroGLYCERIN (NITROSTAT) 0.4 MG SL tablet Place 1 tablet (0.4 mg total) under the tongue every 5 (five) minutes as needed for chest pain.  . Marland KitchenFEV 150 MG CAPS TAKE 1 CAPSULE BY MOUTH TWICE DAILY WITH FOOD. CALL [phone] REFILLS  . OXYGEN Inhale into the lungs. 3L prn  . predniSONE (DELTASONE) 10 MG tablet TAKE 1  TABLET (10 MG TOTAL) BY MOUTH DAILY WITH BREAKFAST.  Marland Kitchen sacubitril-valsartan (ENTRESTO) 24-26 MG Take 1 tablet by mouth 2 (two) times daily.  Marland Kitchen spironolactone (ALDACTONE) 25 MG tablet Take 1 tablet (25 mg total) by mouth daily.   No facility-administered encounter medications on file as of 11/14/2019.     Current Diagnosis/Assessment:  Goals Addressed   None     Diabetes   Recent Relevant Labs: Lab Results  Component Value Date/Time   HGBA1C 6.9 (A) 10/09/2019 08:17 AM   HGBA1C 11.2 (H) 02/07/2019 04:06 PM   HGBA1C 6.4  11/20/2017 05:48 PM   MICROALBUR 1.6 10/09/2019 08:42 AM   MICROALBUR 1.9 09/21/2015 10:24 AM     Checking BG: {CHL HP Blood Glucose Monitoring Frequency:[phone]}  Recent FBG Readings: Recent pre-meal BG readings: *** Recent 2hr PP BG readings:  *** Recent HS BG readings: *** Patient has failed these meds in past: farxiga (developed orthostasis per note from Dr. [name]) Patient is currently controlled on the following medications: metformin 546m twice daily  Last diabetic Eye exam:  Lab Results  Component Value Date/Time   HMDIABEYEEXA No Retinopathy 09/25/2017 12:00 AM    Last diabetic Foot exam:  Lab Results  Component Value Date/Time   HMDIABFOOTEX yes 05/09/2010 12:00 AM     We discussed: {CHL HP Upstream Pharmacy discussion:[id number]}  Plan  Continue {CHL HP Upstream Pharmacy Plans:[phone]}   Heart Failure   Type: {type of heart failure:30421350}   Medtronic ICD single chamber  Last ejection fraction: 09/19/19 20-25% NYHA Class: {CHL HP Upstream Pharm NYHA [phone]} AHA HF Stage: {CHL HP Upstream Pharm AHA HF Stage:[phone]}  Patient has failed these meds in past: *** Patient is currently {CHL Controlled/Uncontrolled:[phone]} on the following medications: Carvedilol 12.534mtwice daily, digoxin 0.12584m/2 tab daily, furosemide 24m10mily, entresto 24-26mg65mce daily, spironolactone 25mg 91my  We discussed {CHL HP Upstream Pharmacy discussion:2482355640}  Plan  Continue {CHL HP Upstream Pharmacy Plans:[phone]} ,  Hypertension   CMP Latest Ref Rng & Units 09/18/2019 06/19/2019 04/09/2019  Glucose 70 - 99 mg/dL 174(H) 143(H) 231(H)  BUN 6 - 20 mg/dL 40(H) 30(H) 26(H)  Creatinine 0.61 - 1.24 mg/dL 2.19(H) 1.66(H) 1.79(H)  Sodium 135 - 145 mmol/L 139 139 140  Potassium 3.5 - 5.1 mmol/L 4.3 5.3(H) 4.4  Chloride 98 - 111 mmol/L 99 104 103  CO2 22 - 32 mmol/L _0 Calcium 8.9 - 10.3 mg/dL 9.9 9.3 9.3  Total Protein 6.0 - 8.3 g/dL -  - 6.6  Total Bilirubin 0.2 - 1.2 mg/dL - - 0.8  Alkaline Phos 39 - 117 U/L - - 49  AST 0 - 37 U/L - - 10  ALT 0 - 53 U/L - - 16   Kidney Function Lab Results  Component Value Date/Time   CREATININE 2.19 (H) 09/18/2019 03:08 PM   CREATININE 1.66 (H) 06/19/2019 12:35 PM   CREATININE 2.30 (H) 02/07/2019 04:06 PM   CREATININE 1.63 (H) 03/22/2016 02:54 PM   GFR 39.24 (L) 04/09/2019 02:15 PM   GFRNONAA 32 (L) 09/18/2019 03:08 PM   GFRAA 37 (L) 09/18/2019 03:08 PM   BP today is:  {CHL HP UPSTREAM Pharmacist BP [phone]}  Office blood pressures are  BP Readings from Last 3 Encounters:  10/09/19 98/64  09/19/19 104/60  09/18/19 (!) 90/59   Blood pressure goal <130/80  Patient has failed these meds in the past: *** Patient is currently controlled on the following medications: Carvedilol 12.5mg tw63m daily  Patient checks BP  at home {CHL HP BP Monitoring Frequency:[phone]}  Patient home BP readings are ranging: ***  We discussed {CHL HP Upstream Pharmacy discussion:[phone]}  Plan  Continue {CHL HP Upstream Pharmacy [phone]}   Hyperlipidemia/CAD   Lipid Panel     Component Value Date/Time   CHOL 168 06/19/2019 1235   TRIG 255 (H) 06/19/2019 1235   HDL 49 06/19/2019 1235   CHOLHDL 3.4 06/19/2019 1235   VLDL 51 (H) 06/19/2019 1235   LDLCALC 68 06/19/2019 1235   [place]  02/07/2019 1606     Comment:     . LDL cholesterol not calculated. Triglyceride levels greater than 400 mg/dL invalidate calculated LDL results. . Reference range: <100 . Desirable range <100 mg/dL for primary prevention;   <70 mg/dL for patients with CHD or diabetic patients  with > or = 2 CHD risk factors. Marland Kitchen LDL-C is now calculated using the Martin-Hopkins  calculation, which is a validated novel method providing  better accuracy than the Friedewald equation in the  estimation of LDL-C.  Cresenciano Genre et al. Annamaria Helling. 0258;527(78): 2061-2068   (http://education.[employer].com/faq/FAQ164)    LDLDIRECT 69.0 11/20/2017 1748    LDL goal <70  The ASCVD Risk score Mikey Bussing DC Jr., et al., 2013) failed to calculate for the following reasons:   The patient has a prior MI or stroke diagnosis   Patient has failed these meds in past: *** Patient is currently controlled on the following medications: atorvastatin 86m daily, NTG as needed  We discussed:  {CHL HP Upstream Pharmacy discussion:[phone]}  Plan  Continue {CHL HP Upstream Pharmacy PEUMPN:[id number]}  AFlutter   Patient is currently {CHL HP BP RATE/RHYTHM:[phone]} controlled. HR *** BPM  Patient has failed these meds in past: *** Patient is currently {CHL Controlled/Uncontrolled:[phone]} on the following medications: Eliquis 543mtwice daily  We discussed:  {CHL HP Upstream Pharmacy discussion:[phone]}  Plan  Continue {CHL HP Upstream Pharmacy PlGQQPY:[id number]}Interstitial Lung Disease/Tobacco   Last spirometry score: ***  Gold Grade: {CHL HP Upstream Pharm COPD Gold GrIWPYK:[id number]}urrent COPD Classification:  {CHL HP Upstream Pharm COPD Classification:[phone]}  Eosinophil count:   Lab Results  Component Value Date/Time   EOSPCT 1.2 04/09/2019 02:15 PM  %                               Eos (Absolute):  Lab Results  Component Value Date/Time   EOSABS 0.1 04/09/2019 02:15 PM   EOSABS 0.2 07/10/2018 05:00 PM    Tobacco Status:  Social History   Tobacco Use  Smoking Status Never Smoker  Smokeless Tobacco Current User  . Types: Chew    Patient has failed these meds in past: *** Patient is currently {CHL Controlled/Uncontrolled:[phone]} on the following medications: albuterol nebulizer as needed, albuterol inhaler as needed, dulera 200-40m84m2 puffs twice daily, Ofev 150m68mice daily, predisone 10mg62mly Using maintenance inhaler regularly? {yes/no:20286} Frequency of rescue inhaler use:  {CHL HP Upstream Pharm Inhaler  Freq:LZJQ:[id number]}D from amiodarone toxicity  We discussed:  {CHL HP Upstream Pharmacy discussion:[phone]}  Plan  Continue {CHL HP Upstream Pharmacy [phone]}    Other Diagnosis:***    Patient has failed these meds in past: *** Patient is currently {CHL Controlled/Uncontrolled:[phone]} on the following medications: ***  We discussed:  {CHL HP Upstream Pharmacy discussion:[phone]}  Plan  Continue {CHL HP Upstream Pharmacy [phone]}   Miscellaneous Meds Mucinex 600mg 60mhrs  Meds to D/C from list

## 2019-11-18 ENCOUNTER — Ambulatory Visit (INDEPENDENT_AMBULATORY_CARE_PROVIDER_SITE_OTHER): Payer: Medicare HMO

## 2019-11-18 ENCOUNTER — Other Ambulatory Visit: Payer: Self-pay

## 2019-11-18 ENCOUNTER — Encounter: Payer: Self-pay | Admitting: Sports Medicine

## 2019-11-18 ENCOUNTER — Ambulatory Visit: Payer: Medicare HMO | Admitting: Podiatry

## 2019-11-18 ENCOUNTER — Ambulatory Visit: Payer: Medicare HMO | Admitting: Sports Medicine

## 2019-11-18 VITALS — BP 101/66 | HR 89 | Temp 97.3°F | Resp 16

## 2019-11-18 DIAGNOSIS — L02619 Cutaneous abscess of unspecified foot: Secondary | ICD-10-CM

## 2019-11-18 DIAGNOSIS — L03115 Cellulitis of right lower limb: Secondary | ICD-10-CM | POA: Diagnosis not present

## 2019-11-18 DIAGNOSIS — M79671 Pain in right foot: Secondary | ICD-10-CM | POA: Diagnosis not present

## 2019-11-18 DIAGNOSIS — L97511 Non-pressure chronic ulcer of other part of right foot limited to breakdown of skin: Secondary | ICD-10-CM | POA: Diagnosis not present

## 2019-11-18 DIAGNOSIS — E1165 Type 2 diabetes mellitus with hyperglycemia: Secondary | ICD-10-CM

## 2019-11-18 DIAGNOSIS — L03119 Cellulitis of unspecified part of limb: Secondary | ICD-10-CM | POA: Diagnosis not present

## 2019-11-18 MED ORDER — AMOXICILLIN-POT CLAVULANATE 875-125 MG PO TABS: 1.0000 | ORAL_TABLET | Freq: Two times a day (BID) | ORAL | 0 refills | Status: DC

## 2019-11-18 NOTE — Progress Notes (Signed)
Subjective: Vincent Chandler is a 59 y.o. male patient seen in office for evaluation of ulceration of the right foot. Patient has a history of diabetes and a blood glucose level today NOT RECORDED.   Patient is changing the dressing using betadine but then stopped it because the area seemed healed. Reports about 2 weeks ago started to see at little bit of redness and started to have more swelling at side of foot and ankle on right. Denies nausea/fever/vomiting/chills/night sweats/shortness of breath/pain. Patient has no other pedal complaints at this time.  Patient Active Problem List   Diagnosis Date Noted  . Type 2 diabetes mellitus with diabetic polyneuropathy, without long-term current use of insulin ([place]) 10/09/2019  . Type 2 diabetes mellitus with stage 3b chronic kidney disease, without long-term current use of insulin ([place]) 10/09/2019  . Diabetes mellitus ([place]) 10/09/2019  . Chronic respiratory failure with hypoxia ([place]) 04/07/2019  . Type 2 diabetes mellitus with stage 3 chronic kidney disease, without long-term current use of insulin ([place]) 02/26/2019  . Uncontrolled type 2 diabetes mellitus with hyperglycemia ([place]) 02/11/2019  . ILD (interstitial lung disease) ([place])   . Chronic periodontitis 08/30/2017  . Atrial flutter ([place]) 08/08/2017  . Dyspnea 08/07/2017  . Hyperglycemia 08/07/2017  . CKD (chronic kidney disease) 2-3 07/19/2014  . NSTEMI (non-ST elevated myocardial infarction) ([place]) 07/18/2014  . OSA (obstructive sleep apnea) 07/18/2014  . Tachycardia   . V tach ([place]) 07/17/2014  . Hypoxemia 07/17/2014  . CHF (congestive heart failure) ([place]) 06/04/2012  . Sinus tachycardia 12/29/2010  . Coronary artery disease prior RCA stent with new inferior Q waves 10/18/2010  . Ischemic and nonischemic cardiomyopathy   10/18/2010  . Uncontrolled type 2 diabetes mellitus without complication, without long-term current use of insulin 03/10/2010  . Hyperlipidemia 03/10/2010  . Essential  hypertension 03/10/2010   Current Outpatient Medications on File Prior to Visit  Medication Sig Dispense Refill  . ACCU-CHEK FASTCLIX LANCETS MISC Use as directed once a day.  Dx code: E11.9 100 each 1  . albuterol (ACCUNEB) 0.63 MG/3ML nebulizer solution Take 3 mLs (0.63 mg total) by nebulization every 6 (six) hours as needed for wheezing. 75 mL 12  . albuterol (VENTOLIN HFA) 108 (90 Base) MCG/ACT inhaler Inhale 2 puffs into the lungs every 6 (six) hours as needed for wheezing or shortness of breath.    Marland Kitchen atorvastatin (LIPITOR) 40 MG tablet TAKE 1 TABLET (40 MG TOTAL) BY MOUTH AT BEDTIME. 90 tablet 3  . blood glucose meter kit and supplies Dispense based on patient and insurance preference. Use as directed once a day. Dx Code E11.9. 1 each 0  . Blood Glucose Monitoring Suppl (ACCU-CHEK GUIDE) w/Device KIT 1 each by Does not apply route daily. DX Code: E11.9 1 kit 0  . carvedilol (COREG) 12.5 MG tablet Take 12.5 mg by mouth 2 (two) times daily with a meal.    . digoxin (LANOXIN) 0.125 MG tablet TAKE 1/2 TABLET BY MOUTH DAILY. 45 tablet 3  . ELIQUIS 5 MG TABS tablet TAKE 1 TABLET BY MOUTH 2 TIMES DAILY. 180 tablet 3  . furosemide (LASIX) 20 MG tablet Take 2 tablets (40 mg total) by mouth every morning AND 1 tablet (20 mg total) every evening. 60 tablet 5  . glucose blood (ACCU-CHEK GUIDE) test strip Use as directed once a day.  Dx code: E11.9 100 each 1  . guaiFENesin (MUCINEX) 600 MG 12 hr tablet Take 1 tablet (600 mg total) by mouth 2 (two) times  daily as needed for to loosen phlegm.    . metFORMIN (GLUCOPHAGE) 500 MG tablet Take 1 tablet (500 mg total) by mouth 2 (two) times daily with a meal. 180 tablet 3  . mometasone-formoterol (DULERA) 200-5 MCG/ACT AERO Inhale 2 puffs into the lungs 2 (two) times a day. 2 Inhaler 0  . nitroGLYCERIN (NITROSTAT) 0.4 MG SL tablet Place 1 tablet (0.4 mg total) under the tongue every 5 (five) minutes as needed for chest pain. 25 tablet 3  . OFEV 150 MG CAPS TAKE  1 CAPSULE BY MOUTH TWICE DAILY WITH FOOD. CALL [phone] FOR REFILLS 60 capsule 5  . OXYGEN Inhale into the lungs. 3L prn    . predniSONE (DELTASONE) 10 MG tablet TAKE 1 TABLET (10 MG TOTAL) BY MOUTH DAILY WITH BREAKFAST. 90 tablet 1  . sacubitril-valsartan (ENTRESTO) 24-26 MG Take 1 tablet by mouth 2 (two) times daily. 60 tablet 5  . spironolactone (ALDACTONE) 25 MG tablet Take 1 tablet (25 mg total) by mouth daily. 90 tablet 3   No current facility-administered medications on file prior to visit.   No Known Allergies  Recent Results (from the past 2160 hour(s))  CBC     Status: Abnormal   Collection Time: 09/18/19  3:08 PM  Result Value Ref Range   WBC 9.2 4.0 - 10.5 K/uL   RBC 3.83 (L) 4.22 - 5.81 MIL/uL   Hemoglobin 15.0 13.0 - 17.0 g/dL   HCT 44.7 39.0 - 52.0 %   MCV 116.7 (H) 80.0 - 100.0 fL   MCH 39.2 (H) 26.0 - 34.0 pg   MCHC 33.6 30.0 - 36.0 g/dL   RDW 14.6 11.5 - 15.5 %   Platelets 207 150 - 400 K/uL   nRBC 0.2 0.0 - 0.2 %    Comment: Performed at [facility], [place] [address], [place], [place] [address]  Basic metabolic panel     Status: Abnormal   Collection Time: 09/18/19  3:08 PM  Result Value Ref Range   Sodium 139 135 - 145 mmol/L   Potassium 4.3 3.5 - 5.1 mmol/L   Chloride 99 98 - 111 mmol/L   CO2 26 22 - 32 mmol/L   Glucose, Bld 174 (H) 70 - 99 mg/dL    Comment: Glucose reference range applies only to samples taken after fasting for at least 8 hours.   BUN 40 (H) 6 - 20 mg/dL   Creatinine, Ser 2.19 (H) 0.61 - 1.24 mg/dL   Calcium 9.9 8.9 - 10.3 mg/dL   GFR calc non Af Amer 32 (L) >60 mL/min   GFR calc Af Amer 37 (L) >60 mL/min   Anion gap 14 5 - 15    Comment: Performed at [facility] [address], [place], [place] [address]  POCT Glucose (CBG)     Status: Abnormal   Collection Time: 10/09/19  8:12 AM  Result Value Ref Range   POC Glucose 131 (A) 70 - 99 mg/dl  POCT HgB A1C     Status: Abnormal   Collection Time: 10/09/19  8:17 AM   Result Value Ref Range   Hemoglobin A1C 6.9 (A) 4.0 - 5.6 %   HbA1c POC (<> result, manual entry)     HbA1c, POC (prediabetic range)     HbA1c, POC (controlled diabetic range)    Microalbumin / creatinine urine ratio     Status: None   Collection Time: 10/09/19  8:42 AM  Result Value Ref Range   Microalb,  Ur 1.6 0.0 - 1.9 mg/dL   Creatinine,U 163.9 mg/dL   Microalb Creat Ratio 1.0 0.0 - 30.0 mg/g    Objective: There were no vitals filed for this visit.  General: Patient is awake, alert, oriented x 3 and in no acute distress.  Dermatology: Skin is warm and dry bilateral with a partial thickness ulceration present sub met 5 on right. Ulceration measures 0.1cm x 0.1 cm x 0.3cm. There is a keratotic border with a graular base. The ulceration does not  probe to bone. There is no malodor, no active drainage, blanchable erythema to lateral foot and ankle, focal dorsolateral edema. No other acute signs of infection.   Vascular: Dorsalis Pedis pulse = 1/4 Bilateral,  Posterior Tibial pulse = 1/4 Bilateral,  Capillary Fill Time < 5 seconds  Neurologic: Gross sensation present via light touch on right  Musculosketal: No Pain with palpation to ulcerated area. No pain with compression to calves bilateral.  Xrays, Right foot:At dorsolateral foot, No bony destruction suggestive of osteomyelitis. No gas in soft tissues.   No results for input(s): GRAMSTAIN, LABORGA in the last 8760 hours.  Assessment and Plan:  Problem List Items Addressed This Visit      Endocrine   Uncontrolled type 2 diabetes mellitus with hyperglycemia ([place])    Other Visit Diagnoses    Right foot pain    -  Primary   Relevant Medications   amoxicillin-clavulanate (AUGMENTIN) 875-125 MG tablet   Other Relevant Orders   DG Foot Complete Right   Cellulitis and abscess of foot, except toes       Relevant Medications   amoxicillin-clavulanate (AUGMENTIN) 875-125 MG tablet   Right foot ulcer, limited to breakdown of  skin ([place])           -Examined patient and discussed the progression of the wound and treatment alternatives. -Xrays reviewed - Excisionally dedbrided ulceration at right sub met 5 to healthy bleeding borders removing nonviable tissue using a sterile chisel blade. Wound measures post debridement as above. Wound was debrided to the level of the dermis with viable wound base exposed to promote healing. Hemostasis was achieved with manuel pressure. Patient tolerated procedure well without any discomfort or anesthesia necessary for this wound debridement.  -Applied medihoney and dry sterile dressing and instructed patient to continue with daily dressings at home consisting of the same -Rx Augmentin for preventative measures -Advised patient to return to surgical shoe - Advised patient to go to the ER or return to office if the wound worsens or if constitutional symptoms are present. -Patient to return to office on next week as scheduled for follow up care with Dr. Posey Pronto or sooner if problems arise.  Landis Martins, DPM

## 2019-11-20 ENCOUNTER — Other Ambulatory Visit: Payer: Self-pay | Admitting: Sports Medicine

## 2019-11-20 DIAGNOSIS — L02619 Cutaneous abscess of unspecified foot: Secondary | ICD-10-CM

## 2019-11-20 IMAGING — DX DG ORTHOPANTOGRAM /PANORAMIC
1 series · 1 of 1 positions shown · non-contrast
Comparison: None.

CLINICAL DATA: Pre LVAD

EXAM:
ORTHOPANTOGRAM/PANORAMIC

[view not recorded]
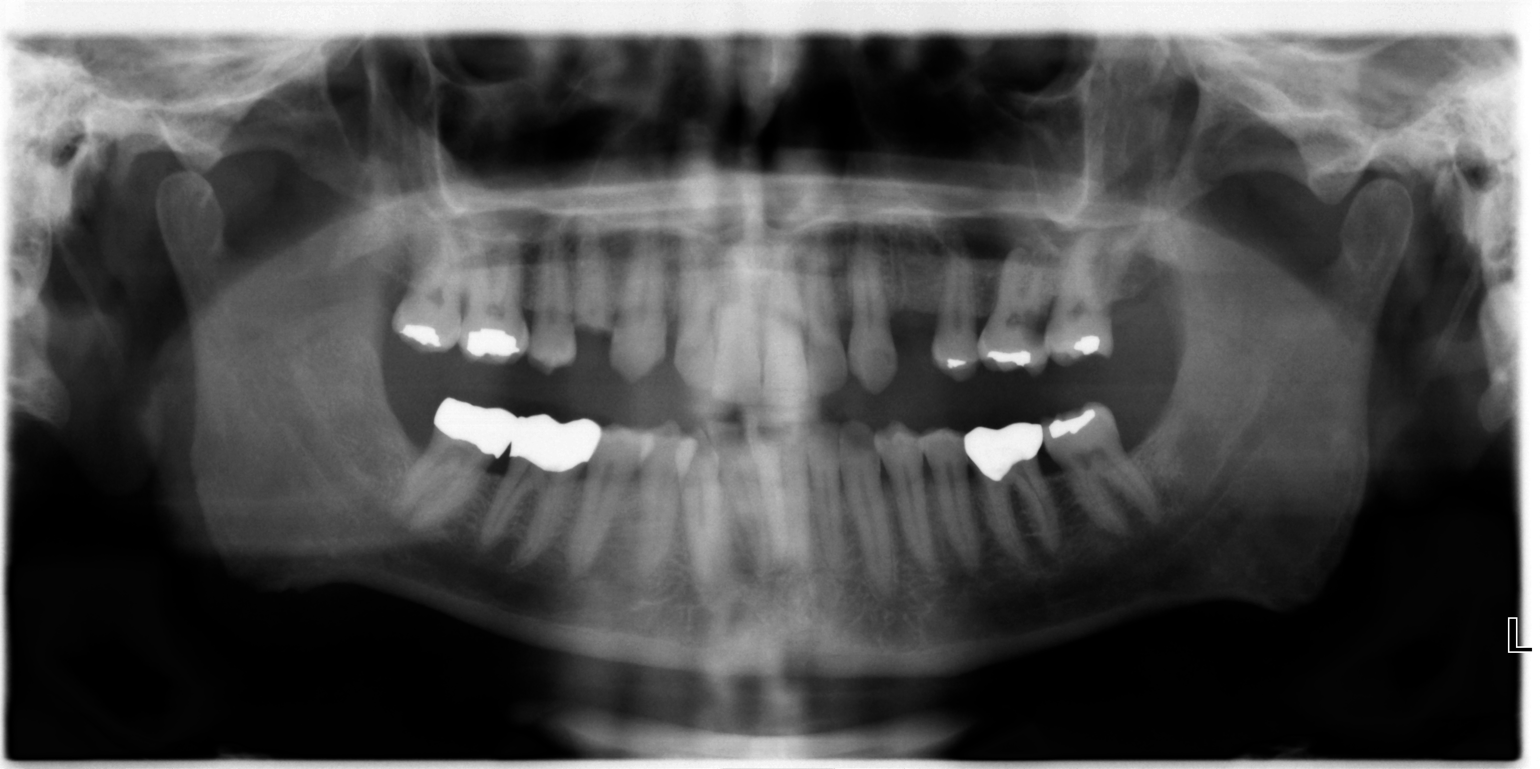

[1 of 1 positions shown; findings below may reference images not displayed]

FINDINGS: Caries left upper second molar. No other caries or periapical
abscess. No acute bony abnormality
IMPRESSION: Caries left upper second molar

## 2019-11-24 ENCOUNTER — Ambulatory Visit: Payer: Medicare HMO | Admitting: Podiatrist

## 2019-11-26 ENCOUNTER — Other Ambulatory Visit: Payer: Self-pay

## 2019-11-26 DIAGNOSIS — N182 Chronic kidney disease, stage 2 (mild): Secondary | ICD-10-CM

## 2019-11-26 DIAGNOSIS — E1165 Type 2 diabetes mellitus with hyperglycemia: Secondary | ICD-10-CM

## 2019-11-26 DIAGNOSIS — I959 Hypotension, unspecified: Secondary | ICD-10-CM

## 2019-11-27 ENCOUNTER — Telehealth: Payer: Self-pay | Admitting: Family Medicine

## 2019-11-27 NOTE — Progress Notes (Signed)
  Chronic Care Management   Outreach Note  11/27/2019 Name: RAJ LANDRESS MRN: [id number] DOB: December 13, 1960  Referred by: Ann Held, DO Reason for referral : No chief complaint on file.   An unsuccessful telephone outreach was attempted today. The patient was referred to the pharmacist for assistance with care management and care coordination.   This note is not being shared with the patient for the following reason: To respect privacy (The patient or proxy has requested that the information not be shared).  Follow Up Plan:   Earney [place] Upstream Scheduler

## 2019-12-02 MED FILL — ENTRESTO 24 MG-26 MG TABLET: 24-26 | 30 days supply | Qty: 60 | Fill #2

## 2019-12-02 MED FILL — metFORMIN HCL 500 MG TABS: 500 | 30 days supply | Qty: 60 | Fill #3

## 2019-12-02 MED FILL — ATORVASTATIN 40 MG TABLET: 40 | 30 days supply | Qty: 30 | Fill #7

## 2019-12-02 MED FILL — FUROSEMIDE 20 MG TABS: 20 | 20 days supply | Qty: 60 | Fill #3

## 2019-12-04 DIAGNOSIS — I2510 Atherosclerotic heart disease of native coronary artery without angina pectoris: Secondary | ICD-10-CM | POA: Diagnosis not present

## 2019-12-04 DIAGNOSIS — I509 Heart failure, unspecified: Secondary | ICD-10-CM | POA: Diagnosis not present

## 2019-12-08 DIAGNOSIS — J849 Interstitial pulmonary disease, unspecified: Secondary | ICD-10-CM | POA: Diagnosis not present

## 2019-12-08 DIAGNOSIS — J9611 Chronic respiratory failure with hypoxia: Secondary | ICD-10-CM | POA: Diagnosis not present

## 2019-12-08 DIAGNOSIS — G4733 Obstructive sleep apnea (adult) (pediatric): Secondary | ICD-10-CM | POA: Diagnosis not present

## 2019-12-09 ENCOUNTER — Encounter: Payer: Medicare HMO | Admitting: *Deleted

## 2019-12-09 ENCOUNTER — Other Ambulatory Visit: Payer: Self-pay

## 2019-12-09 ENCOUNTER — Ambulatory Visit (HOSPITAL_COMMUNITY)
Admission: RE | Admit: 2019-12-09 | Discharge: 2019-12-09 | Disposition: A | Discharge status: Home / Self Care | Payer: Medicare HMO | Source: Ambulatory Visit | Attending: Cardiology | Admitting: Cardiology

## 2019-12-09 VITALS — BP 81/53 | HR 92 | Wt 174.2 lb

## 2019-12-09 DIAGNOSIS — Z006 Encounter for examination for normal comparison and control in clinical research program: Secondary | ICD-10-CM

## 2019-12-09 DIAGNOSIS — I255 Ischemic cardiomyopathy: Secondary | ICD-10-CM | POA: Diagnosis not present

## 2019-12-09 LAB — BASIC METABOLIC PANEL
Anion gap: 12 (ref 5–15)
BUN: 51 mg/dL — ABNORMAL HIGH (ref 6–20)
CO2: 27 mmol/L (ref 22–32)
Calcium: 9.2 mg/dL (ref 8.9–10.3)
Chloride: 102 mmol/L (ref 98–111)
Creatinine, Ser: 1.87 mg/dL — ABNORMAL HIGH (ref 0.61–1.24)
GFR calc Af Amer: 45 mL/min — ABNORMAL LOW (ref >60)
GFR calc non Af Amer: 39 mL/min — ABNORMAL LOW (ref >60)
Glucose, Bld: 198 mg/dL — ABNORMAL HIGH (ref 70–99)
Potassium: 4.4 mmol/L (ref 3.5–5.1)
Sodium: 141 mmol/L (ref 135–145)

## 2019-12-10 NOTE — Research (Signed)
23M visit for Beat HF   Patient doing well, having some issues with the ulcer on his foot again but doing better. This has been an ongoing issue for him.  No med changes from last visit. Dr [name] decided to decrease his Coreg 6.25 mg twice a day. Per Dr [name] a BMP was drawn today.     Current Outpatient Medications:  .  ACCU-CHEK FASTCLIX LANCETS MISC, Use as directed once a day.  Dx code: E11.9, Disp: 100 each, Rfl: 1 .  albuterol (ACCUNEB) 0.63 MG/3ML nebulizer solution, Take 3 mLs (0.63 mg total) by nebulization every 6 (six) hours as needed for wheezing., Disp: 75 mL, Rfl: 12 .  albuterol (VENTOLIN HFA) 108 (90 Base) MCG/ACT inhaler, Inhale 2 puffs into the lungs every 6 (six) hours as needed for wheezing or shortness of breath. , Disp: , Rfl:  .  amoxicillin-clavulanate (AUGMENTIN) 875-125 MG tablet, Take 1 tablet by mouth 2 (two) times daily., Disp: 28 tablet, Rfl: 0 .  atorvastatin (LIPITOR) 40 MG tablet, TAKE 1 TABLET (40 MG TOTAL) BY MOUTH AT BEDTIME., Disp: 90 tablet, Rfl: 3 .  blood glucose meter kit and supplies, Dispense based on patient and insurance preference. Use as directed once a day. Dx Code E11.9., Disp: 1 each, Rfl: 0 .  Blood Glucose Monitoring Suppl (ACCU-CHEK GUIDE) w/Device KIT, 1 each by Does not apply route daily. DX Code: E11.9, Disp: 1 kit, Rfl: 0 .  carvedilol (COREG) 12.5 MG tablet, Take 6.25 mg by mouth 2 (two) times daily with a meal., Disp: , Rfl:  .  digoxin (LANOXIN) 0.125 MG tablet, TAKE 1/2 TABLET BY MOUTH DAILY., Disp: 45 tablet, Rfl: 3 .  ELIQUIS 5 MG TABS tablet, TAKE 1 TABLET BY MOUTH 2 TIMES DAILY., Disp: 180 tablet, Rfl: 3 .  furosemide (LASIX) 20 MG tablet, Take 2 tablets (40 mg total) by mouth every morning AND 1 tablet (20 mg total) every evening., Disp: 60 tablet, Rfl: 5 .  glucose blood (ACCU-CHEK GUIDE) test strip, Use as directed once a day.  Dx code: E11.9, Disp: 100 each, Rfl: 1 .  guaiFENesin (MUCINEX) 600 MG 12 hr tablet, Take 1 tablet  (600 mg total) by mouth 2 (two) times daily as needed for to loosen phlegm., Disp: , Rfl:  .  metFORMIN (GLUCOPHAGE) 500 MG tablet, Take 1 tablet (500 mg total) by mouth 2 (two) times daily with a meal., Disp: 180 tablet, Rfl: 3 .  mometasone-formoterol (DULERA) 200-5 MCG/ACT AERO, Inhale 2 puffs into the lungs 2 (two) times a day., Disp: 2 Inhaler, Rfl: 0 .  nitroGLYCERIN (NITROSTAT) 0.4 MG SL tablet, Place 1 tablet (0.4 mg total) under the tongue every 5 (five) minutes as needed for chest pain., Disp: 25 tablet, Rfl: 3 .  OFEV 150 MG CAPS, TAKE 1 CAPSULE BY MOUTH TWICE DAILY WITH FOOD. CALL [phone] FOR REFILLS, Disp: 60 capsule, Rfl: 5 .  OXYGEN, Inhale into the lungs. 3L prn , Disp: , Rfl:  .  predniSONE (DELTASONE) 10 MG tablet, TAKE 1 TABLET (10 MG TOTAL) BY MOUTH DAILY WITH BREAKFAST., Disp: 90 tablet, Rfl: 1 .  sacubitril-valsartan (ENTRESTO) 24-26 MG, Take 1 tablet by mouth 2 (two) times daily., Disp: 60 tablet, Rfl: 5 .  spironolactone (ALDACTONE) 25 MG tablet, Take 1 tablet (25 mg total) by mouth daily., Disp: 90 tablet, Rfl: 3

## 2019-12-18 ENCOUNTER — Other Ambulatory Visit (HOSPITAL_COMMUNITY): Payer: Self-pay | Admitting: Cardiology

## 2019-12-18 MED FILL — CARVEDILOL 25 MG TABLET: 25 | 90 days supply | Qty: 180 | Fill #0

## 2019-12-18 MED FILL — DIGOXIN 0.125 MG TABLET: 125 | 90 days supply | Qty: 45 | Fill #1

## 2019-12-22 MED FILL — FUROSEMIDE 20 MG TABS: 20 | 20 days supply | Qty: 60 | Fill #4

## 2019-12-24 ENCOUNTER — Telehealth: Payer: Self-pay | Admitting: Family Medicine

## 2019-12-24 NOTE — Progress Notes (Signed)
Chronic Care Management   Outreach Note  12/24/2019 Name: Vincent Chandler MRN: [id number] DOB: 1961-05-10  Referred by: Ann Held, DO Reason for referral : No chief complaint on file.   An unsuccessful telephone outreach was attempted today. The patient was referred to the pharmacist for assistance with care management and care coordination. This note is not being shared with the patient for the following reason: To respect privacy (The patient or proxy has requested that the information not be shared).  Follow Up Plan:   Earney Hamburg Upstream Scheduler

## 2019-12-25 ENCOUNTER — Ambulatory Visit: Payer: Medicare HMO | Admitting: Podiatry

## 2019-12-25 ENCOUNTER — Other Ambulatory Visit: Payer: Self-pay

## 2019-12-25 DIAGNOSIS — M79671 Pain in right foot: Secondary | ICD-10-CM | POA: Diagnosis not present

## 2019-12-25 DIAGNOSIS — L97511 Non-pressure chronic ulcer of other part of right foot limited to breakdown of skin: Secondary | ICD-10-CM | POA: Diagnosis not present

## 2019-12-26 ENCOUNTER — Encounter: Payer: Self-pay | Admitting: Podiatry

## 2019-12-26 NOTE — Progress Notes (Signed)
Subjective:  Patient ID: Vincent Chandler, male    DOB: Dec 10, 1960,  MRN: [id number]  Chief Complaint  Patient presents with  . Wound Check    pt is here for a wound check of the right foot, pt states that the pain is elevated to the touch depending on how much he walked on it.    59 y.o. male presents for wound care.  Patient presents with follow-up of right fifth digit lateral ulceration/submetatarsal 5 ulceration.  Patient states is doing a little bit better.  He has been taking his prescription.  I have asked him to continue taking his antibiotic prescription.  He denies any other acute complaints.  He states the ulcer is improving slowly.   Review of Systems: Negative except as noted in the HPI. Denies N/V/F/Ch.  Past Medical History:  Diagnosis Date  . AICD (automatic cardioverter/defibrillator) present   . Anginal pain ([place])   . Anxiety   . Asthma   . Atrial fibrillation-postoperative 11/28/2012  . Cardiomyopathy    alcohol use related  . CHF (congestive heart failure) ([place])   . Chronic back pain   . Coronary artery disease    drug eluting stent RCA 2005-EF 30%- s/p CABG x 4; 2/4 patent grafts with SVG-PLOM and SVG-RCA system totally occluded. There are collaterals from the LAD system to the RCA and the RCA is totally occluded proximally. There are no collaterals to the LCx territory. He then underwent successful PCI of the mid left circumflex artery with overlapping Synergy drug-eluting stents  . Depression    pt denies  . Diabetes mellitus type II dx'd in the 1990's  . GERD (gastroesophageal reflux disease)    yrs ago  . H/O hiatal hernia   . Hemorrhoids   . History of hypogonadism   . History of kidney stones   . Hyperlipidemia   . Hypertension   . OSA on CPAP    "mask is broken; working on getting a new one" (01/15/2015; 10/03/2017)  . Pneumonia    "3-4 times" (10/03/2017)  . Urinary incontinence     Current Outpatient Medications:  .  ACCU-CHEK FASTCLIX LANCETS  MISC, Use as directed once a day.  Dx code: E11.9, Disp: 100 each, Rfl: 1 .  albuterol (ACCUNEB) 0.63 MG/3ML nebulizer solution, Take 3 mLs (0.63 mg total) by nebulization every 6 (six) hours as needed for wheezing., Disp: 75 mL, Rfl: 12 .  albuterol (VENTOLIN HFA) 108 (90 Base) MCG/ACT inhaler, Inhale 2 puffs into the lungs every 6 (six) hours as needed for wheezing or shortness of breath. , Disp: , Rfl:  .  amoxicillin-clavulanate (AUGMENTIN) 875-125 MG tablet, Take 1 tablet by mouth 2 (two) times daily., Disp: 28 tablet, Rfl: 0 .  atorvastatin (LIPITOR) 40 MG tablet, TAKE 1 TABLET (40 MG TOTAL) BY MOUTH AT BEDTIME., Disp: 90 tablet, Rfl: 3 .  blood glucose meter kit and supplies, Dispense based on patient and insurance preference. Use as directed once a day. Dx Code E11.9., Disp: 1 each, Rfl: 0 .  Blood Glucose Monitoring Suppl (ACCU-CHEK GUIDE) w/Device KIT, 1 each by Does not apply route daily. DX Code: E11.9, Disp: 1 kit, Rfl: 0 .  carvedilol (COREG) 12.5 MG tablet, TAKE 2 TABLETS (25 MG TOTAL) BY MOUTH 2 TIMES DAILY WITH A MEAL., Disp: 180 tablet, Rfl: 3 .  digoxin (LANOXIN) 0.125 MG tablet, TAKE 1/2 TABLET BY MOUTH DAILY., Disp: 45 tablet, Rfl: 3 .  ELIQUIS 5 MG TABS tablet, TAKE 1 TABLET BY  MOUTH 2 TIMES DAILY., Disp: 180 tablet, Rfl: 3 .  furosemide (LASIX) 20 MG tablet, Take 2 tablets (40 mg total) by mouth every morning AND 1 tablet (20 mg total) every evening., Disp: 60 tablet, Rfl: 5 .  glucose blood (ACCU-CHEK GUIDE) test strip, Use as directed once a day.  Dx code: E11.9, Disp: 100 each, Rfl: 1 .  guaiFENesin (MUCINEX) 600 MG 12 hr tablet, Take 1 tablet (600 mg total) by mouth 2 (two) times daily as needed for to loosen phlegm., Disp: , Rfl:  .  metFORMIN (GLUCOPHAGE) 500 MG tablet, Take 1 tablet (500 mg total) by mouth 2 (two) times daily with a meal., Disp: 180 tablet, Rfl: 3 .  mometasone-formoterol (DULERA) 200-5 MCG/ACT AERO, Inhale 2 puffs into the lungs 2 (two) times a day.,  Disp: 2 Inhaler, Rfl: 0 .  nitroGLYCERIN (NITROSTAT) 0.4 MG SL tablet, Place 1 tablet (0.4 mg total) under the tongue every 5 (five) minutes as needed for chest pain., Disp: 25 tablet, Rfl: 3 .  OFEV 150 MG CAPS, TAKE 1 CAPSULE BY MOUTH TWICE DAILY WITH FOOD. CALL [phone] FOR REFILLS, Disp: 60 capsule, Rfl: 5 .  OXYGEN, Inhale into the lungs. 3L prn , Disp: , Rfl:  .  predniSONE (DELTASONE) 10 MG tablet, TAKE 1 TABLET (10 MG TOTAL) BY MOUTH DAILY WITH BREAKFAST., Disp: 90 tablet, Rfl: 1 .  sacubitril-valsartan (ENTRESTO) 24-26 MG, Take 1 tablet by mouth 2 (two) times daily., Disp: 60 tablet, Rfl: 5 .  spironolactone (ALDACTONE) 25 MG tablet, Take 1 tablet (25 mg total) by mouth daily., Disp: 90 tablet, Rfl: 3  Social History   Tobacco Use  Smoking Status Never Smoker  Smokeless Tobacco Current User  . Types: Chew    No Known Allergies Objective:  There were no vitals filed for this visit. There is no height or weight on file to calculate BMI. Constitutional Well developed. Well nourished.  Vascular Dorsalis pedis pulses palpable bilaterally. Posterior tibial pulses palpable bilaterally. Capillary refill normal to all digits.  No cyanosis or clubbing noted. Pedal hair growth normal.  Neurologic Normal speech. Oriented to person, place, and time. Protective sensation absent  Dermatologic Wound Location: Right lateral fifth metatarsal head.  No malodor present no clinical signs of infection aside from mild erythema present.  No purulent drainage noted. Wound Base: Mixed Granular/Fibrotic Peri-wound: Calloused Exudate: Scant/small amount Serosanguinous exudate Wound Measurements: -See below  Orthopedic: No pain to palpation either foot.   Radiographs: None Assessment:   1. Right foot pain   2. Right foot ulcer, limited to breakdown of skin ([place])    Plan:  Patient was evaluated and treated and all questions answered.  Ulcer right fifth metatarsal head with partial  thickness. -Debridement as below. -Dressed with Betadine wet-to-dry, DSD. -I instructed him to go back into a surgical shoe to completely offload the right fifth metatarsal head.  Patient states understanding. -Surgical shoe was dispensed.  Procedure: Excisional Debridement of Wound improving Tool: Sharp chisel blade/tissue nipper Rationale: Removal of non-viable soft tissue from the wound to promote healing.  Anesthesia: none Pre-Debridement Wound Measurements: 0.1 cm x 0.1 cm x 0.3 cm Post-Debridement Wound Measurements: 0.1 cm x 0.17 x 0.3 cm Type of Debridement: Sharp Excisional Tissue Removed: Non-viable soft tissue Blood loss: Minimal (<50cc) Depth of Debridement: Partial thickness Technique: Sharp excisional debridement to bleeding, viable wound base.  Wound Progress: Today will be the first debridement.  We will continue to monitor progress. Site healing conversation 7 Dressing:  Dry, sterile, compression dressing. Disposition: Patient tolerated procedure well. Patient to return in 1 week for follow-up.  No follow-ups on file.

## 2019-12-31 ENCOUNTER — Other Ambulatory Visit (HOSPITAL_COMMUNITY): Payer: Self-pay | Admitting: Cardiology

## 2019-12-31 MED FILL — ATORVASTATIN 40 MG TABLET: 40 | 30 days supply | Qty: 30 | Fill #0

## 2019-12-31 MED FILL — METFORMIN HCL 500 MG TABS: 500 | 90 days supply | Qty: 180 | Fill #0

## 2019-12-31 MED FILL — ENTRESTO 24 MG-26 MG TABLET: 24-26 | 30 days supply | Qty: 60 | Fill #3

## 2020-01-01 ENCOUNTER — Telehealth: Payer: Self-pay | Admitting: Family Medicine

## 2020-01-01 NOTE — Progress Notes (Signed)
  Chronic Care Management   Outreach Note  01/01/2020 Name: Vincent Chandler MRN: [id number] DOB: 1961-01-08  Referred by: Ann Held, DO Reason for referral : No chief complaint on file.   An unsuccessful telephone outreach was attempted today. The patient was referred to the pharmacist for assistance with care management and care coordination. This note is not being shared with the patient for the following reason: To respect privacy (The patient or proxy has requested that the information not be shared).  Follow Up Plan:   Earney Hamburg Upstream Scheduler

## 2020-01-03 DIAGNOSIS — I509 Heart failure, unspecified: Secondary | ICD-10-CM | POA: Diagnosis not present

## 2020-01-03 DIAGNOSIS — I2510 Atherosclerotic heart disease of native coronary artery without angina pectoris: Secondary | ICD-10-CM | POA: Diagnosis not present

## 2020-01-07 ENCOUNTER — Telehealth: Payer: Self-pay | Admitting: Family Medicine

## 2020-01-07 DIAGNOSIS — J849 Interstitial pulmonary disease, unspecified: Secondary | ICD-10-CM | POA: Diagnosis not present

## 2020-01-07 DIAGNOSIS — G4733 Obstructive sleep apnea (adult) (pediatric): Secondary | ICD-10-CM | POA: Diagnosis not present

## 2020-01-07 DIAGNOSIS — J9611 Chronic respiratory failure with hypoxia: Secondary | ICD-10-CM | POA: Diagnosis not present

## 2020-01-07 NOTE — Progress Notes (Signed)
Chronic Care Management   Outreach Note  01/07/2020 Name: Vincent Chandler MRN: [id number] DOB: [birth year]  Referred by: Ann Held, DO Reason for referral : No chief complaint on file.   An unsuccessful telephone outreach was attempted today. The patient was referred to the pharmacist for assistance with care management and care coordination.   Follow Up Plan:   Earney Hamburg Upstream Scheduler

## 2020-01-12 ENCOUNTER — Telehealth: Payer: Self-pay | Admitting: Family Medicine

## 2020-01-12 MED FILL — FUROSEMIDE 20 MG TABS: 20 | 20 days supply | Qty: 60 | Fill #5

## 2020-01-12 NOTE — Progress Notes (Signed)
  Chronic Care Management   Outreach Note  01/12/2020 Name: Vincent Chandler MRN: [id number] DOB: 1960-12-18  Referred by: Ann Held, DO Reason for referral : No chief complaint on file.   An unsuccessful telephone outreach was attempted today. The patient was referred to the pharmacist for assistance with care management and care coordination.   Follow Up Plan:   Earney Hamburg Upstream Scheduler

## 2020-01-19 ENCOUNTER — Encounter: Payer: Self-pay | Admitting: Podiatry

## 2020-01-19 ENCOUNTER — Ambulatory Visit: Payer: Medicare HMO | Admitting: Podiatry

## 2020-01-19 ENCOUNTER — Other Ambulatory Visit: Payer: Self-pay

## 2020-01-19 DIAGNOSIS — E1165 Type 2 diabetes mellitus with hyperglycemia: Secondary | ICD-10-CM | POA: Diagnosis not present

## 2020-01-19 DIAGNOSIS — L97511 Non-pressure chronic ulcer of other part of right foot limited to breakdown of skin: Secondary | ICD-10-CM

## 2020-01-19 DIAGNOSIS — L02619 Cutaneous abscess of unspecified foot: Secondary | ICD-10-CM

## 2020-01-19 DIAGNOSIS — L03119 Cellulitis of unspecified part of limb: Secondary | ICD-10-CM

## 2020-01-19 MED ORDER — DOXYCYCLINE HYCLATE 100 MG PO TABS
100.0000 mg | ORAL_TABLET | Freq: Two times a day (BID) | ORAL | 0 refills | Status: DC
Start: 2020-01-19 — End: 2020-01-26

## 2020-01-19 NOTE — Progress Notes (Signed)
Subjective:  Patient ID: Vincent Chandler, male    DOB: [birth year],  MRN: [id number]  No chief complaint on file.   59 y.o. male presents for wound care.  Patient presents with a follow-up of right fifth digit lateral ulceration that has completely reepithelialized.  Patient has been doing regular dressing changes without any acute complaints.  He states that overall he is doing well.  He has a new complaint of right second digit erythema/infection.  He states that this started over the weekend has progressive gotten worse.  He denies any other acute complaints.  He has not done anything such as dressing changes.  He wanted to get evaluated.  He denies any other nausea fever chills vomiting shortness of breath.  Review of Systems: Negative except as noted in the HPI. Denies N/V/F/Ch.  Past Medical History:  Diagnosis Date  . AICD (automatic cardioverter/defibrillator) present   . Anginal pain ([place])   . Anxiety   . Asthma   . Atrial fibrillation-postoperative 11/28/2012  . Cardiomyopathy    alcohol use related  . CHF (congestive heart failure) ([place])   . Chronic back pain   . Coronary artery disease    drug eluting stent RCA 2005-EF 30%- s/p CABG x 4; 2/4 patent grafts with SVG-PLOM and SVG-RCA system totally occluded. There are collaterals from the LAD system to the RCA and the RCA is totally occluded proximally. There are no collaterals to the LCx territory. He then underwent successful PCI of the mid left circumflex artery with overlapping Synergy drug-eluting stents  . Depression    pt denies  . Diabetes mellitus type II dx'd in the 1990's  . GERD (gastroesophageal reflux disease)    yrs ago  . H/O hiatal hernia   . Hemorrhoids   . History of hypogonadism   . History of kidney stones   . Hyperlipidemia   . Hypertension   . OSA on CPAP    "mask is broken; working on getting a new one" (01/15/2015; 10/03/2017)  . Pneumonia    "3-4 times" (10/03/2017)  . Urinary incontinence      Current Outpatient Medications:  .  ACCU-CHEK FASTCLIX LANCETS MISC, Use as directed once a day.  Dx code: E11.9, Disp: 100 each, Rfl: 1 .  albuterol (ACCUNEB) 0.63 MG/3ML nebulizer solution, Take 3 mLs (0.63 mg total) by nebulization every 6 (six) hours as needed for wheezing., Disp: 75 mL, Rfl: 12 .  albuterol (VENTOLIN HFA) 108 (90 Base) MCG/ACT inhaler, Inhale 2 puffs into the lungs every 6 (six) hours as needed for wheezing or shortness of breath. , Disp: , Rfl:  .  amoxicillin-clavulanate (AUGMENTIN) 875-125 MG tablet, Take 1 tablet by mouth 2 (two) times daily., Disp: 28 tablet, Rfl: 0 .  atorvastatin (LIPITOR) 40 MG tablet, TAKE 1 TABLET BY MOUTH AT BEDTIME., Disp: 30 tablet, Rfl: 3 .  blood glucose meter kit and supplies, Dispense based on patient and insurance preference. Use as directed once a day. Dx Code E11.9., Disp: 1 each, Rfl: 0 .  Blood Glucose Monitoring Suppl (ACCU-CHEK GUIDE) w/Device KIT, 1 each by Does not apply route daily. DX Code: E11.9, Disp: 1 kit, Rfl: 0 .  carvedilol (COREG) 12.5 MG tablet, TAKE 2 TABLETS (25 MG TOTAL) BY MOUTH 2 TIMES DAILY WITH A MEAL., Disp: 180 tablet, Rfl: 3 .  digoxin (LANOXIN) 0.125 MG tablet, TAKE 1/2 TABLET BY MOUTH DAILY., Disp: 45 tablet, Rfl: 3 .  doxycycline (VIBRA-TABS) 100 MG tablet, Take 1 tablet (100 mg  total) by mouth 2 (two) times daily., Disp: 20 tablet, Rfl: 0 .  ELIQUIS 5 MG TABS tablet, TAKE 1 TABLET BY MOUTH 2 TIMES DAILY., Disp: 180 tablet, Rfl: 3 .  furosemide (LASIX) 20 MG tablet, Take 2 tablets (40 mg total) by mouth every morning AND 1 tablet (20 mg total) every evening., Disp: 60 tablet, Rfl: 5 .  glucose blood (ACCU-CHEK GUIDE) test strip, Use as directed once a day.  Dx code: E11.9, Disp: 100 each, Rfl: 1 .  guaiFENesin (MUCINEX) 600 MG 12 hr tablet, Take 1 tablet (600 mg total) by mouth 2 (two) times daily as needed for to loosen phlegm., Disp: , Rfl:  .  metFORMIN (GLUCOPHAGE) 500 MG tablet, Take 1 tablet (500 mg  total) by mouth 2 (two) times daily with a meal., Disp: 180 tablet, Rfl: 3 .  mometasone-formoterol (DULERA) 200-5 MCG/ACT AERO, Inhale 2 puffs into the lungs 2 (two) times a day., Disp: 2 Inhaler, Rfl: 0 .  nitroGLYCERIN (NITROSTAT) 0.4 MG SL tablet, Place 1 tablet (0.4 mg total) under the tongue every 5 (five) minutes as needed for chest pain., Disp: 25 tablet, Rfl: 3 .  OFEV 150 MG CAPS, TAKE 1 CAPSULE BY MOUTH TWICE DAILY WITH FOOD. CALL [phone] FOR REFILLS, Disp: 60 capsule, Rfl: 5 .  OXYGEN, Inhale into the lungs. 3L prn , Disp: , Rfl:  .  predniSONE (DELTASONE) 10 MG tablet, TAKE 1 TABLET (10 MG TOTAL) BY MOUTH DAILY WITH BREAKFAST., Disp: 90 tablet, Rfl: 1 .  sacubitril-valsartan (ENTRESTO) 24-26 MG, Take 1 tablet by mouth 2 (two) times daily., Disp: 60 tablet, Rfl: 5 .  spironolactone (ALDACTONE) 25 MG tablet, Take 1 tablet (25 mg total) by mouth daily., Disp: 90 tablet, Rfl: 3  Social History   Tobacco Use  Smoking Status Never Smoker  Smokeless Tobacco Current User  . Types: Chew    No Known Allergies Objective:  There were no vitals filed for this visit. There is no height or weight on file to calculate BMI. Constitutional Well developed. Well nourished.  Vascular Dorsalis pedis pulses palpable bilaterally. Posterior tibial pulses palpable bilaterally. Capillary refill normal to all digits.  No cyanosis or clubbing noted. Pedal hair growth normal.  Neurologic Normal speech. Oriented to person, place, and time. Protective sensation absent  Dermatologic  right lateral fifth metatarsal head wound completely reepithelialized without any underlying ulceration.  No clinical signs of infection noted there.  Right second digit mild erythema without underlying ulceration, crepitus, abscess formation palpated.  No ulcers noted.  Orthopedic: No pain to palpation either foot.   Radiographs: None Assessment:   1. Right foot ulcer, limited to breakdown of skin ([place])   2.  Uncontrolled type 2 diabetes mellitus with hyperglycemia ([facility])   3. Cellulitis and abscess of foot, except toes    Plan:  Patient was evaluated and treated and all questions answered.  Ulcer right fifth metatarsal head with partial thickness. -Completely healed.  No underlying ulceration noted.  Continue applying triple antibiotic and Band-Aid for next week followed by discontinuation of any application or dressing changes.  Patient states understanding. -He may begin transitioning to regular sneakers.  If there is any frictional blister of any other acute complaints or ulceration come back and see me right away.   Right second digit soft tissue infection -I explained patient the etiology of infection versus treatment options discussed.  Given that patient does not have any underlying wound or any entry point for bacterial growth I am  inclined to think that this may likely be vascular related as patient does have a history of peripheral vascular disease with dependent rubor to the second digit however just to be on the safer side I will place him on doxycycline for 10 days for skin and soft tissue prophylaxis. -Doxycycline was sent to his pharmacy. No follow-ups on file.

## 2020-01-23 ENCOUNTER — Encounter: Payer: Self-pay | Admitting: Pulmonary Disease

## 2020-01-26 ENCOUNTER — Encounter: Payer: Medicare HMO | Admitting: *Deleted

## 2020-01-26 ENCOUNTER — Encounter (HOSPITAL_COMMUNITY): Payer: Self-pay | Admitting: Cardiology

## 2020-01-26 ENCOUNTER — Ambulatory Visit (HOSPITAL_COMMUNITY)
Admission: RE | Admit: 2020-01-26 | Discharge: 2020-01-26 | Disposition: A | Discharge status: Home / Self Care | Payer: Medicare HMO | Source: Ambulatory Visit | Attending: Cardiology | Admitting: Cardiology

## 2020-01-26 ENCOUNTER — Other Ambulatory Visit: Payer: Self-pay

## 2020-01-26 VITALS — BP 98/72 | HR 72 | Wt 170.6 lb

## 2020-01-26 VITALS — BP 94/59 | HR 63 | Wt 170.0 lb

## 2020-01-26 DIAGNOSIS — I4891 Unspecified atrial fibrillation: Secondary | ICD-10-CM | POA: Diagnosis not present

## 2020-01-26 DIAGNOSIS — I130 Hypertensive heart and chronic kidney disease with heart failure and stage 1 through stage 4 chronic kidney disease, or unspecified chronic kidney disease: Secondary | ICD-10-CM | POA: Insufficient documentation

## 2020-01-26 DIAGNOSIS — R42 Dizziness and giddiness: Secondary | ICD-10-CM | POA: Diagnosis not present

## 2020-01-26 DIAGNOSIS — L97519 Non-pressure chronic ulcer of other part of right foot with unspecified severity: Secondary | ICD-10-CM | POA: Insufficient documentation

## 2020-01-26 DIAGNOSIS — I5023 Acute on chronic systolic (congestive) heart failure: Secondary | ICD-10-CM | POA: Diagnosis not present

## 2020-01-26 DIAGNOSIS — J8410 Pulmonary fibrosis, unspecified: Secondary | ICD-10-CM | POA: Insufficient documentation

## 2020-01-26 DIAGNOSIS — I2723 Pulmonary hypertension due to lung diseases and hypoxia: Secondary | ICD-10-CM | POA: Insufficient documentation

## 2020-01-26 DIAGNOSIS — I255 Ischemic cardiomyopathy: Secondary | ICD-10-CM | POA: Insufficient documentation

## 2020-01-26 DIAGNOSIS — Z8249 Family history of ischemic heart disease and other diseases of the circulatory system: Secondary | ICD-10-CM | POA: Diagnosis not present

## 2020-01-26 DIAGNOSIS — E785 Hyperlipidemia, unspecified: Secondary | ICD-10-CM | POA: Insufficient documentation

## 2020-01-26 DIAGNOSIS — N183 Chronic kidney disease, stage 3 unspecified: Secondary | ICD-10-CM | POA: Insufficient documentation

## 2020-01-26 DIAGNOSIS — I5022 Chronic systolic (congestive) heart failure: Secondary | ICD-10-CM

## 2020-01-26 DIAGNOSIS — G4733 Obstructive sleep apnea (adult) (pediatric): Secondary | ICD-10-CM | POA: Insufficient documentation

## 2020-01-26 DIAGNOSIS — I2510 Atherosclerotic heart disease of native coronary artery without angina pectoris: Secondary | ICD-10-CM | POA: Insufficient documentation

## 2020-01-26 DIAGNOSIS — Z720 Tobacco use: Secondary | ICD-10-CM | POA: Insufficient documentation

## 2020-01-26 DIAGNOSIS — J849 Interstitial pulmonary disease, unspecified: Secondary | ICD-10-CM

## 2020-01-26 DIAGNOSIS — Z7984 Long term (current) use of oral hypoglycemic drugs: Secondary | ICD-10-CM | POA: Insufficient documentation

## 2020-01-26 DIAGNOSIS — Z006 Encounter for examination for normal comparison and control in clinical research program: Secondary | ICD-10-CM

## 2020-01-26 DIAGNOSIS — Z7901 Long term (current) use of anticoagulants: Secondary | ICD-10-CM | POA: Insufficient documentation

## 2020-01-26 DIAGNOSIS — E11621 Type 2 diabetes mellitus with foot ulcer: Secondary | ICD-10-CM | POA: Insufficient documentation

## 2020-01-26 DIAGNOSIS — Z79899 Other long term (current) drug therapy: Secondary | ICD-10-CM | POA: Diagnosis not present

## 2020-01-26 DIAGNOSIS — I4892 Unspecified atrial flutter: Secondary | ICD-10-CM | POA: Diagnosis not present

## 2020-01-26 DIAGNOSIS — E1122 Type 2 diabetes mellitus with diabetic chronic kidney disease: Secondary | ICD-10-CM | POA: Insufficient documentation

## 2020-01-26 DIAGNOSIS — R55 Syncope and collapse: Secondary | ICD-10-CM | POA: Insufficient documentation

## 2020-01-26 DIAGNOSIS — Z951 Presence of aortocoronary bypass graft: Secondary | ICD-10-CM | POA: Insufficient documentation

## 2020-01-26 LAB — BASIC METABOLIC PANEL
Anion gap: 12 (ref 5–15)
BUN: 68 mg/dL — ABNORMAL HIGH (ref 6–20)
CO2: 25 mmol/L (ref 22–32)
Calcium: 9.6 mg/dL (ref 8.9–10.3)
Chloride: 103 mmol/L (ref 98–111)
Creatinine, Ser: 2.23 mg/dL — ABNORMAL HIGH (ref 0.61–1.24)
GFR calc Af Amer: 36 mL/min — ABNORMAL LOW (ref >60)
GFR calc non Af Amer: 31 mL/min — ABNORMAL LOW (ref >60)
Glucose, Bld: 115 mg/dL — ABNORMAL HIGH (ref 70–99)
Potassium: 4.7 mmol/L (ref 3.5–5.1)
Sodium: 140 mmol/L (ref 135–145)

## 2020-01-26 LAB — DIGOXIN LEVEL: Digoxin Level: 0.7 ng/mL — ABNORMAL LOW (ref 0.8–2.0)

## 2020-01-26 MED ORDER — ICOSAPENT ETHYL 1 G PO CAPS
2.0000 g | ORAL_CAPSULE | Freq: Two times a day (BID) | ORAL | 6 refills | Status: DC
Start: 2020-01-26 — End: 2021-02-22

## 2020-01-26 MED ORDER — CARVEDILOL 6.25 MG PO TABS: 6.2500 mg | ORAL_TABLET | Freq: Two times a day (BID) | ORAL | 6 refills | Status: DC

## 2020-01-26 MED FILL — predniSONE 10 MG TABS: 10 | 90 days supply | Qty: 90 | Fill #1

## 2020-01-26 MED FILL — ATORVASTATIN 40 MG TABLET: 40 | 30 days supply | Qty: 30 | Fill #1

## 2020-01-26 MED FILL — SPIRONOLACTONE 25 MG TABS: 25 | 90 days supply | Qty: 90 | Fill #1

## 2020-01-26 NOTE — Telephone Encounter (Signed)
Error

## 2020-01-26 NOTE — Progress Notes (Signed)
Date:  01/26/2020   ID:  Vincent Chandler, DOB 10/12/1960, MRN [id number]   Provider location: [facility] Type of Visit: Established patient  PCP:  Ann Held, DO  Cardiologist: Dr. [name]   History of Present Illness: Vincent Chandler is a 59 y.o. male who has a history of A flutter, chronic systolic heart failure, CAD s/p CABG, CKD Stage III, DMII, OSA .  Admitted 08/07/17 with atrial flutter/RVR and acute on chronic systolic heart failure. Underwent successful DC/CV on 2/8. Required short term milrinone but was able to wean off. HF meds started. Echo this admission with EF 15%. D/C weight 201 pounds.  CPX in 2/19 showed severe functional impairment due to heart failure. However, PFTs were restrictive and high resolution CT chest was concerning for interstitial lung disease.   He has had atrial flutter ablation.   He had RHC in 4/19 showing preserved cardiac output and low filling pressures, but moderate pulmonary hypertension.   He saw pulmonary regarding interstitial lung disease and had bronchoscopy.  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  He has been started on prednisone, cough is much improved on prednisone.    He saw Dr. Caryl Comes, not planning to upgrade ICD to CRT given IVCD but not LBBB-like.   He had a Barostim device placed in 1/20.   I tried him on [place] but he developed orthostasis after starting it and had to stop.   He has been on prednisone for suspected amiodarone pulmonary toxicity/pulmonary fibrosis.  He has had a chronic cough.   With worsened dyspnea, I took him for RHC in 6/20.  This showed normal filling pressures and low but not markedly low cardiac output. High resolution CT chest in 6/20 showed interstitial lung disease in UIP patttern. He saw pulmonary and prednisone was increased then tapered back down.   Echo in 3/21 showed EF 20-25%, mild RV dilation with moderately decreased systolic function.    He returns today for followup of CHF and CAD.  Using CPAP at night and oxygen most of the time during the day.  He gets dizzy/lightheaded when not wearing oxygen and oxygen level drops.  About 2 wks ago, he had an episode of syncope which he attributes to low oxygen saturation (was not wearing his oxygen).  Weight is down 12 lbs.  Poor stamina, gets short of breath walking around grocery store and with other moderate exertion.  Generally does ok in his house.  He had a right foot ulcer which has healed.  No chest pain.  No orthopnea/PND.     ECG (personally reviewed): NSR, RVH  Labs (2/19): free T4 increased but free T3 normal, LDL 86, HDL 30 Labs (3/19): K 4.9, creatinine 1.53, hgb 14.3, LDL 52, ANA negative, RF negative Labs (4/19): K 4.2, creatinine 1.27 Labs (5/19): LDL 69, K 4.4, creatinine 1.36 Labs (8/19): K 5.3, creatinine 2.2 Labs (10/19): K 5, creatinine 1.49 Labs (12/19): hgb 13.4 Labs (2/20): K 4.2, creatinine 1.45, hgb 14.3 Labs (5/20): K 4.2, creatinine 1.81, digoxin 0.6, LDL 16, TGs 396, hgb 13.8 Labs (6/20): creatinine 2 Labs (8/20): LDL 27, Tgs 284, LFTs normal, K 4.5, creatinine 2.3.  Labs (10/20): K 4.4, creatinine 1.79, hgb 13.4 Labs (12/20): LDL 68, TGs 255 Labs (3/21): K 4.3, creatinine 2.19 Labs (6/21): K 4.4, creatinine 1.87  PMH:  1. Atrial fibrillation: Noted post-op CABG.  2. Atrial flutter: Noted during 2/19 admission, DCCV done.  S/p Ablation 3/19.  3. CAD: s/p CABG.  - LHC (1/16):  Patent LIMA-LAD and SVG-D. 80% mLCx and 99% PLOM, total occlusion of SVG-PLOM.  Total occlusion of RCA with occlusion of SVG-RCA.  Patient had DES to mLCx-PLOM.  4. HTN 5. Type II diabetes  6. Hyperlipidemia 7. Chronic systolic CHF: Ischemic cardiomyopathy.   - Echo (2/19): EF 15%, mildly dilated LV, mild LVH, inferolateral akinesis, milr MR, mildly dilated RV with severely reduced systolic function.  - CPX (2/19): Peak VO2 12.2, VE/VCO2 slope 53, RER 1.07 => severe  limitation from heart failure.  - Medtronic ICD  - RHC (4/19): mean RA 5, PA 54/18 mean 31, mean PCWP 12, CI 2.5, PVR 3.6 WU - Echo (1/20): EF 20% - Barostimulator device placed in 1/20.  - RHC (6/20): mean RA 6, PA 42/17 mean 27, mean PCWP 12, CI 2.29, PVR 3.1  - Echo (3/21): EF 20-25%, mild RV dilation with moderately decreased systolic function 8. Interstitial lung disease: PFTs (2/19) were restrictive, raising concern for ILD.  - High resolution CT chest in 2/19: interstitial lung disease concerning for usual interstitial pneumonitis. - Bronchoscopy was done, possible amiodarone lung toxicity causing ILD, now on prednisone.  - High resolution chest CT (6/20): ILD in UIP pattern.   - Ofev 9. CKD stage 3 10. OSA: Home sleep study in 2020 with severe OSA.  11. ABIs (2/19): not significantly abnormal 12. Carotid dopplers (2/19): Mild disease only.  - Carotid dopplers (1/20): Minimal stenosis.  13. Pulmonary hypertension: ?Group 3 due to ILD.  14. Nephrolithiasis   Current Outpatient Medications  Medication Sig Dispense Refill   ACCU-CHEK FASTCLIX LANCETS MISC Use as directed once a day.  Dx code: E11.9 100 each 1   albuterol (ACCUNEB) 0.63 MG/3ML nebulizer solution Take 3 mLs (0.63 mg total) by nebulization every 6 (six) hours as needed for wheezing. 75 mL 12   albuterol (VENTOLIN HFA) 108 (90 Base) MCG/ACT inhaler Inhale 2 puffs into the lungs every 6 (six) hours as needed for wheezing or shortness of breath.      amoxicillin-clavulanate (AUGMENTIN) 875-125 MG tablet Take 1 tablet by mouth 2 (two) times daily. 28 tablet 0   atorvastatin (LIPITOR) 40 MG tablet TAKE 1 TABLET BY MOUTH AT BEDTIME. 30 tablet 3   blood glucose meter kit and supplies Dispense based on patient and insurance preference. Use as directed once a day. Dx Code E11.9. 1 each 0   Blood Glucose Monitoring Suppl (ACCU-CHEK GUIDE) w/Device KIT 1 each by Does not apply route daily. DX Code: E11.9 1 kit 0    carvedilol (COREG) 6.25 MG tablet Take 1 tablet (6.25 mg total) by mouth 2 (two) times daily with a meal. 60 tablet 6   digoxin (LANOXIN) 0.125 MG tablet TAKE 1/2 TABLET BY MOUTH DAILY. 45 tablet 3   ELIQUIS 5 MG TABS tablet TAKE 1 TABLET BY MOUTH 2 TIMES DAILY. 180 tablet 3   furosemide (LASIX) 20 MG tablet Take 2 tablets (40 mg total) by mouth every morning AND 1 tablet (20 mg total) every evening. 60 tablet 5   glucose blood (ACCU-CHEK GUIDE) test strip Use as directed once a day.  Dx code: E11.9 100 each 1   guaiFENesin (MUCINEX) 600 MG 12 hr tablet Take 1 tablet (600 mg total) by mouth 2 (two) times daily as needed for to loosen phlegm.     metFORMIN (GLUCOPHAGE) 500 MG tablet Take 1 tablet (500 mg total) by mouth 2 (two) times daily  with a meal. 180 tablet 3   mometasone-formoterol (DULERA) 200-5 MCG/ACT AERO Inhale 2 puffs into the lungs 2 (two) times a day. 2 Inhaler 0   nitroGLYCERIN (NITROSTAT) 0.4 MG SL tablet Place 1 tablet (0.4 mg total) under the tongue every 5 (five) minutes as needed for chest pain. 25 tablet 3   OFEV 150 MG CAPS TAKE 1 CAPSULE BY MOUTH TWICE DAILY WITH FOOD. CALL [phone] FOR REFILLS 60 capsule 5   OXYGEN Inhale into the lungs. 3L prn      predniSONE (DELTASONE) 10 MG tablet TAKE 1 TABLET (10 MG TOTAL) BY MOUTH DAILY WITH BREAKFAST. 90 tablet 1   sacubitril-valsartan (ENTRESTO) 24-26 MG Take 1 tablet by mouth 2 (two) times daily. 60 tablet 5   spironolactone (ALDACTONE) 25 MG tablet Take 1 tablet (25 mg total) by mouth daily. 90 tablet 3   icosapent Ethyl (VASCEPA) 1 g capsule Take 2 capsules (2 g total) by mouth 2 (two) times daily. 120 capsule 6   No current facility-administered medications for this encounter.    Allergies:   Patient has no known allergies.   Social History:  The patient  reports that he has never smoked. His smokeless tobacco use includes chew. He reports current alcohol use of about 3.0 standard drinks of alcohol per week.  He reports that he does not use drugs.   Family History:  The patient's family history includes Diabetes in his father and mother; Hyperlipidemia in his father; Hypertension in his father and mother.   ROS:  Please see the history of present illness.   All other systems are personally reviewed and negative.   Exam:    BP 98/72    Pulse 72    Wt 77.4 kg (170 lb 9.6 oz)    SpO2 97% Comment: 3L of oxygen   BMI 24.48 kg/m  General: NAD Neck: No JVD, no thyromegaly or thyroid nodule.  Lungs: Dry crackles at bases.  CV: Nondisplaced PMI.  Heart regular S1/S2, no S3/S4, no murmur.  No peripheral edema.  No carotid bruit.  Difficult to palpate pedal pulses.  Abdomen: Soft, nontender, no hepatosplenomegaly, no distention.  Skin: Intact without lesions or rashes.  Neurologic: Alert and oriented x 3.  Psych: Normal affect. Extremities: No clubbing or cyanosis.  HEENT: Normal.    Recent Labs: 03/07/2019: B Natriuretic Peptide 278.9 04/09/2019: ALT 16 09/18/2019: Hemoglobin 15.0; Platelets 207 01/26/2020: BUN 68; Creatinine, Ser 2.23; Potassium 4.7; Sodium 140  Personally reviewed   Wt Readings from Last 3 Encounters:  01/26/20 77.4 kg (170 lb 9.6 oz)  12/09/19 79 kg (174 lb 3.2 oz)  10/09/19 81 kg (178 lb 9.6 oz)      ASSESSMENT AND PLAN:  1. Chronic systolic CHF: Ischemic cardiomyopathy. Medtronic ICD single chamber. 2/19 admission for decompensated HF requiring milrinone. Echo 2/7/019 EF 15% Mildly dilated RV. CPX in 2/19 was suggestive of severe limitation due to HF.   RHC in 4/19 showed normal filling pressures and preserved cardiac output.  Echo in 1/20 with EF 20%.  He had a Barostimulator device placed in 1/20.  RHC in 6/20 showed normal filling pressures, CI 2.29.  Echo 3/21 with EF 20-25%.  Chronic NYHA class III symptoms. He does not look volume overloaded on exam today and weight is down. I suspect that a large portion of his dyspnea at this point is due to interstitial lung disease.  BP is running low and he gets "dizzy spells" when his oxygen level is low.  -  Decrease Coreg to 6.25 mg bid to promote higher BP.  - Continue digoxin 0.0625 mg daily, check level.   - Continue Entresto 24/26 bid.   - Continue spironolactone 25 mg daily.    - Continue Lasix 40 qam/20 qpm. BMET today.  - Has IVCD, not LBBB-like.  Saw Dr. Caryl Comes, will not upgrade ICD to CRT.  - With significant ILD requiring oxygen, I do not think that he would be an LVAD candidate.   2. Afib/ Atrial flutter: Paroxysmal.  Admitted in 2/19 with severe HF decompensation in setting of atrial flutter with RVR.  He was cardioverted.  He then had atrial flutter ablation in 3/19.  He is off amiodarone due to question of amiodarone lung toxicity. No palpitations.  NSR today.  - He is now off amiodarone (question of lung toxicity, would not re-challenge in future).   - Continue Eliquis 5 mg bid. Denies bleeding.  3. CAD: S/p CABG.  Last intervention was PCI to LCx in 2016.  No chest pain.   - Continue atorvastatin, good LDL in 12/20.   - Triglycerides have been high, needs to restart Vascepa 2 g bid.  - No ASA with Eliquis use.  4. Pulmonary fibrosis: PFTs in 2/19 were restrictive, and high resolution CT showed interstitial lung disease. Amiodarone was stopped due to concern for possible toxicity.  He has seen [name] => bronchoscopy suggestive of ILD related to amiodarone.  He has been on chronic prednisone.  High resolution CT chest repeated in 6/20, again showing ILD, described at UIP pattern. He has started Ofev.  He needs to wear oxygen at all times, it appears.  - Continue Ofev.    - Continue pulmonary followup, has appt tomorrow.  5. OSA: Continue CPAP.    6. CKD: Stage 3. BMET today.  7. Pulmonary hypertension: Suspect group 3 PH due to lung disease (ILD).  8. DM2: He did not tolerate Farxiga  9. Syncope/dizziness: He attributes this to low oxygen saturation, but BP is also running low and could be  orthostasis-related.  No ICD discharge.  - Check device for arrhythmias.  - Decrease Coreg to 6.25 mg bid as above.  9. PAD: Recent right foot ulcer.  Difficult to palpate pedal pulses.  - I will arrange for peripheral arterial doppler evaluation.   Followup 3 months.   Signed, Loralie Champagne, MD  01/26/2020  [facility] [address] [facility] [place] [address] [phone] (office) [phone] (fax)

## 2020-01-26 NOTE — Patient Instructions (Signed)
Decrease Carvedilol to 6.25 mg Twice daily   Restart Vascepa 2 grams (2 tabs) Twice daily   Labs done today, your results will be available in MyChart, we will contact you for abnormal readings.  Your physician has requested that you have an ankle brachial index (ABI). During this test an ultrasound and blood pressure cuff are used to evaluate the arteries that supply the arms and legs with blood. Allow thirty minutes for this exam. There are no restrictions or special instructions.  Your physician recommends that you schedule a follow-up appointment in: 3 months  If you have any questions or concerns before your next appointment please send [place] a message through [place] or call our office at [phone].    TO LEAVE A MESSAGE FOR THE NURSE SELECT OPTION 2, PLEASE LEAVE A MESSAGE INCLUDING: . YOUR NAME . DATE OF BIRTH . CALL BACK NUMBER . REASON FOR CALL**this is important as we prioritize the call backs  [place] AS LONG AS YOU CALL BEFORE 4:00 PM  At the [facility], you and your health needs are our priority. As part of our continuing mission to provide you with exceptional heart care, we have created designated Provider Care Teams. These Care Teams include your primary Cardiologist (physician) and Advanced Practice Providers (APPs- Physician Assistants and Nurse Practitioners) who all work together to provide you with the care you need, when you need it.   You may see any of the following providers on your designated Care Team at your next follow up: Marland Kitchen Dr Glori Bickers . Dr Loralie Champagne . Darrick Grinder, NP . Lyda Jester, PA . Audry Riles, PharmD   Please be sure to bring in all your medications bottles to every appointment.

## 2020-01-27 ENCOUNTER — Encounter: Payer: Self-pay | Admitting: Pulmonary Disease

## 2020-01-27 ENCOUNTER — Telehealth (HOSPITAL_COMMUNITY): Payer: Self-pay

## 2020-01-27 ENCOUNTER — Other Ambulatory Visit: Payer: Self-pay

## 2020-01-27 ENCOUNTER — Ambulatory Visit: Payer: Medicare HMO | Admitting: Pulmonary Disease

## 2020-01-27 ENCOUNTER — Ambulatory Visit (INDEPENDENT_AMBULATORY_CARE_PROVIDER_SITE_OTHER): Payer: Medicare HMO

## 2020-01-27 VITALS — BP 110/66 | HR 84 | Temp 97.8°F | Ht 69.0 in | Wt 169.4 lb

## 2020-01-27 DIAGNOSIS — J9611 Chronic respiratory failure with hypoxia: Secondary | ICD-10-CM

## 2020-01-27 DIAGNOSIS — J849 Interstitial pulmonary disease, unspecified: Secondary | ICD-10-CM

## 2020-01-27 DIAGNOSIS — R0602 Shortness of breath: Secondary | ICD-10-CM

## 2020-01-27 DIAGNOSIS — I5022 Chronic systolic (congestive) heart failure: Secondary | ICD-10-CM | POA: Diagnosis not present

## 2020-01-27 DIAGNOSIS — J9811 Atelectasis: Secondary | ICD-10-CM | POA: Diagnosis not present

## 2020-01-27 DIAGNOSIS — G4733 Obstructive sleep apnea (adult) (pediatric): Secondary | ICD-10-CM | POA: Diagnosis not present

## 2020-01-27 DIAGNOSIS — J90 Pleural effusion, not elsewhere classified: Secondary | ICD-10-CM | POA: Diagnosis not present

## 2020-01-27 DIAGNOSIS — R0600 Dyspnea, unspecified: Secondary | ICD-10-CM | POA: Diagnosis not present

## 2020-01-27 LAB — COMPREHENSIVE METABOLIC PANEL
ALT: 13 U/L (ref 0–53)
AST: 12 U/L (ref 0–37)
Albumin: 4.1 g/dL (ref 3.5–5.2)
Alkaline Phosphatase: 61 U/L (ref 39–117)
BUN: 73 mg/dL — ABNORMAL HIGH (ref 6–23)
CO2: 26 mEq/L (ref 19–32)
Calcium: 10.1 mg/dL (ref 8.4–10.5)
Chloride: 100 mEq/L (ref 96–112)
Creatinine, Ser: 2.2 mg/dL — ABNORMAL HIGH (ref 0.40–1.50)
GFR: 30.84 mL/min — ABNORMAL LOW (ref >60.00)
Glucose, Bld: 108 mg/dL — ABNORMAL HIGH (ref 70–99)
Potassium: 4.5 mEq/L (ref 3.5–5.1)
Sodium: 137 mEq/L (ref 135–145)
Total Bilirubin: 0.6 mg/dL (ref 0.2–1.2)
Total Protein: 7.5 g/dL (ref 6.0–8.3)

## 2020-01-27 LAB — HEPATIC FUNCTION PANEL
ALT: 13 U/L (ref 0–53)
AST: 12 U/L (ref 0–37)
Albumin: 4.1 g/dL (ref 3.5–5.2)
Alkaline Phosphatase: 61 U/L (ref 39–117)
Bilirubin, Direct: 0.1 mg/dL (ref 0.0–0.3)
Total Bilirubin: 0.6 mg/dL (ref 0.2–1.2)
Total Protein: 7.5 g/dL (ref 6.0–8.3)

## 2020-01-27 MED ORDER — FUROSEMIDE 20 MG PO TABS: 40.0000 mg | ORAL_TABLET | Freq: Every day | ORAL | 5 refills | Status: DC

## 2020-01-27 NOTE — Progress Notes (Signed)
_0  ID: Vincent Chandler, male    DOB: 1960-11-21, 59 y.o.   MRN: [id number]  Chief Complaint  Patient presents with   Follow-up    increased doe    Referring provider: [name], Alferd Apa, *  HPI:  59 year old male current smokeless tobacco user followed in our office for interstitial lung disease and obstructive sleep apnea chronic respiratory failure  PMH: Type 2 diabetes, hyperlipidemia, hypertension Smoker/ Smoking History: Current smokeless tobacco user Maintenance: Ofev, Dulera 200 prn Pt of: [name]  01/27/2020  - Visit   59 year old male current smoker followed in our office for interstitial lung disease, obstructive sleep apnea as well as chronic respiratory failure.  Patient was last seen in our office in October/2020 by [name].  It was recommended at that time that he continue forward with starting antifibrotic's Ofev 100 mg twice daily, follow-up in 6 weeks with [name].  Continue oxygen therapy, needing 3 L pulsed with exertion, and continue CPAP therapy.  Patient continues to follow-up with the advanced heart failure clinic. Recently had an appointment on 01/26/2020.    An excerpt of that assessment and plan as listed below:  ASSESSMENT AND PLAN:  1. Chronic systolic CHF: Ischemic cardiomyopathy. [employer] ICD single chamber. 2/19 admission for decompensated HF requiring milrinone. Echo 2/7/019 EF 15% Mildly dilated RV. CPX in 2/19 was suggestive of severe limitation due to HF. RHC in 4/19 showed normal filling pressures and preserved cardiac output.  Echo in 1/20 with EF 20%.  He had a Barostimulator device placed in 1/20.  RHC in 6/20 showed normal filling pressures, CI 2.29.  Echo 3/21 with EF 20-25%.  Chronic NYHA class III symptoms. He does not look volume overloaded on exam today and weight is down. I suspect that a large portion of his dyspnea at this point is due to interstitial lung disease. BP is running low and he gets "dizzy spells" when his  oxygen level is low.  - Decrease Coreg to 6.25 mg bid to promote higher BP.  - Continue digoxin 0.0625 mg daily, check level.   - Continue Entresto 24/26 bid.   - Continue spironolactone 25 mg daily.   - Continue Lasix 40 qam/20 qpm. BMET today.  - Has IVCD, not LBBB-like. Saw Dr. Caryl Comes, will not upgrade ICD to CRT.  - With significant ILD requiring oxygen, I do not think that he would be an LVAD candidate.  2.Afib/Atrial flutter: Paroxysmal. Admitted in 2/19 with severe HF decompensation in setting of atrial flutter with RVR. He was cardioverted. He then had atrial flutter ablation in 3/19. He is off amiodarone due to question of amiodarone lung toxicity. No palpitations.  NSR today.  - He is now off amiodarone (question of lung toxicity, would not re-challenge in future).  - Continue Eliquis 5 mg bid. Denies bleeding. 3. CAD: S/p CABG. Last intervention was PCI to LCx in 2016.  No chest pain.  - Continue atorvastatin, good LDL in 12/20.   - Triglycerides have been high, needs to restart Vascepa 2 g bid.  - No ASA with Eliquis use. 4. Pulmonary fibrosis: PFTs in 2/19 were restrictive, and high resolution CT showed interstitial lung disease. Amiodarone was stopped due to concern for possible toxicity. He has seen [name] =>bronchoscopy suggestive of ILD related to amiodarone. He has been on chronic prednisone.  High resolution CT chest repeated in 6/20, again showing ILD, described at UIP pattern. He has started Ofev.  He needs to wear oxygen at  all times, it appears.  - Continue Ofev.    - Continue pulmonary followup, has appt tomorrow.  5. OSA: Continue CPAP.    6. CKD: Stage 3. BMET today.  7. Pulmonary hypertension: Suspect group 3 PH due to lung disease (ILD). 8. DM2: He did not tolerate Farxiga  9. Syncope/dizziness: He attributes this to low oxygen saturation, but BP is also running low and could be orthostasis-related.  No ICD discharge.  - Check device for  arrhythmias.  - Decrease Coreg to 6.25 mg bid as above.  9. PAD: Recent right foot ulcer.  Difficult to palpate pedal pulses.  - I will arrange for peripheral arterial doppler evaluation.   Followup 3 months.   Signed, Loralie Champagne, MD 01/26/2020  Patient presenting to office today.  Patient refused to be walked in our office today due to ongoing foot and lower extremity wounds.  CPAP compliance report attempted to be obtained.  Per [facility] patient last used in September/2020.  Patient reporting today that he uses every other night.  We have contacted patient's DME company [employer] who reports the patient does not use his CPAP and many months.  Patient denies this.  Patient reporting that he has had acute worsening shortness of breath.  He reports that now he is on his oxygen 24/7.  He reports that this is acutely worsened.  He has placed himself on oxygen 24/7 due to acute worsening symptoms of dyspnea.  This has not been due to recorded oxygen levels below 88%.  He reports that occasionally when he is sitting in bed his oxygen levels are below 88%.  We will discuss and review this today.    Patient remains adherent to Ofev.  He never completed repeat lab work with Ofev start.  We will discuss and review this today.  Patient remains adherent to daily prednisone managed by cardiology.  Patient has not had repeat lung functioning since 2020.  May need to consider repeat  chest imaging.  Patient also needs follow-up with [name].  We will discuss and coordinate today.   Questionaires / Pulmonary Flowsheets:   ACT:  No flowsheet data found.  MMRC: No flowsheet data found.  Epworth:  Results of the Epworth flowsheet 11/20/2017  Sitting and reading 1  Watching TV 3  Sitting, inactive in a public place (e.g. a theatre or a meeting) 1  As a passenger in a car for an hour without a break 1  Lying down to rest in the afternoon when circumstances permit 3  Sitting and talking to  someone 0  Sitting quietly after a lunch without alcohol 0  In a car, while stopped for a few minutes in traffic 0  Total score 9    Tests:   12/27/2018-CT chest high-res-findings in the lungs remain compatible with interstitial lung disease with a pattern consider probable for UIP per current ATS guidelines, aortic arthrosclerosis  01/16/2019-pulmonary function test-FVC 2.58 (52% predicted), postbronchodilator ratio 83, postbronchodilator FEV1 2.16 (58% predicted), no bronchodilator response, TLC 4.0 (57% predicted), DLCO 11.18 (39% predicted  12/22/2018-home sleep study-AHI 54.2, SaO2 low 60%  FENO:  No results found for: NITRICOXIDE  PFT: PFT Results Latest Ref Rng & Units 01/16/2019 10/30/2017 08/16/2017  FVC-Pre L 2.58 2.81 2.89  FVC-Predicted Pre % 52 59 61  FVC-Post L 2.59 2.84 2.86  FVC-Predicted Post % 53 60 60  Pre FEV1/FVC % % 84 84 84  Post FEV1/FCV % % 83 84 86  FEV1-Pre L 2.16 2.35  2.44  FEV1-Predicted Pre % 58 65 67  FEV1-Post L 2.16 2.39 2.47  DLCO UNC% % 39 28 26  DLCO COR %Predicted % 61 52 45  TLC L 4.00 3.85 4.09  TLC % Predicted % 57 56 60  RV % Predicted % 41 56 54    WALK:  SIX MIN WALK 04/07/2019 01/06/2019 12/04/2018 10/30/2017 09/25/2017  Medications - no medication - - -  Supplimental Oxygen during Test? (L/min) Yes No Yes No No  O2 Flow Rate 3 - 3 - -  Type Pulse - Continuous - -  Laps - 12 - - -  Partial Lap (in Meters) - 14.5 - - -  Baseline BP (sitting) - 106/74 - - -  Baseline Heartrate - 88 - - -  Baseline Dyspnea (Borg Scale) - 2 - - -  Baseline Fatigue (Borg Scale) - 3 - - -  Baseline SPO2 - 91 - - -  BP (sitting) - 124/64 - - -  Heartrate - 113 - - -  Dyspnea (Borg Scale) - 4 - - -  Fatigue (Borg Scale) - 3 - - -  SPO2 - 87 - - -  BP (sitting) - 112/68 - - -  Heartrate - 89 - - -  SPO2 - 93 - - -  Stopped or Paused before Six Minutes - No - - -  Distance Completed - 422.5 - - -  Tech Comments: needed 3L pulse  to stay above 88% TA/CMA  patient completed test at a moderate pace.  patient did complain of dyspnea but attributed this to wearing his mask during the walk.  at end of test, upon viewing the low O2 sat, obtained forehead probe to double-check > showed O2 at 92%. Patient started without O2. Patient had to stop briefly during walk in order for oxygen to be turned on during the 2nd lap. O2 level increased once on 3L and able to maintain 93% during remainder of the walk. Pt walked at a moderate pace completing all required laps. Pt denied any complaints Pt walked at a normal pace completing all required laps. Denied any complaints.    Imaging: No results found.  Lab Results:  CBC    Component Value Date/Time   WBC 9.2 09/18/2019 1508   RBC 3.83 (L) 09/18/2019 1508   HGB 15.0 09/18/2019 1508   HGB 14.3 08/21/2018 1500   HCT 44.7 09/18/2019 1508   HCT 41.0 08/21/2018 1500   PLT 207 09/18/2019 1508   PLT 204 08/21/2018 1500   MCV 116.7 (H) 09/18/2019 1508   MCV 110 (H) 08/21/2018 1500   MCH 39.2 (H) 09/18/2019 1508   MCHC 33.6 09/18/2019 1508   RDW 14.6 09/18/2019 1508   RDW 13.8 08/21/2018 1500   LYMPHSABS 0.8 04/09/2019 1415   LYMPHSABS 1.4 07/10/2018 1700   MONOABS 0.7 04/09/2019 1415   EOSABS 0.1 04/09/2019 1415   EOSABS 0.2 07/10/2018 1700   BASOSABS 0.0 04/09/2019 1415   BASOSABS 0.0 07/10/2018 1700    BMET    Component Value Date/Time   NA 140 01/26/2020 1028   NA 138 07/10/2018 1700   K 4.7 01/26/2020 1028   CL 103 01/26/2020 1028   CO2 25 01/26/2020 1028   GLUCOSE 115 (H) 01/26/2020 1028   BUN 68 (H) 01/26/2020 1028   BUN 30 (H) 07/10/2018 1700   CREATININE 2.23 (H) 01/26/2020 1028   CREATININE 2.30 (H) 02/07/2019 1606   CALCIUM 9.6 01/26/2020 1028   GFRNONAA 31 (L)  01/26/2020 1028   GFRAA 36 (L) 01/26/2020 1028    BNP    Component Value Date/Time   BNP 278.9 (H) 03/07/2019 1056   BNP 78.8 07/14/2015 1547    ProBNP    Component Value Date/Time   PROBNP 731 (H) 07/12/2016 1603    PROBNP 165.0 (H) 06/16/2013 1111    Specialty Problems      Pulmonary Problems   OSA (obstructive sleep apnea)   Dyspnea   ILD (interstitial lung disease) (HCC)   Chronic respiratory failure with hypoxia (HCC)      No Known Allergies  Immunization History  Administered Date(s) Administered   Influenza Inj Mdck Quad Pf 06/14/2018   Influenza Split 06/05/2012   Influenza Whole 05/09/2010   Influenza,inj,Quad PF,6+ Mos 04/09/2013, 06/05/2014, 08/17/2017, 04/07/2019   Influenza-Unspecified 04/03/2015   Moderna SARS-COVID-2 Vaccination 07/29/2019, 09/02/2019   Pneumococcal Conjugate-13 06/05/2014   Pneumococcal Polysaccharide-23 02/17/2012   Td 05/09/2010, 02/07/2019    Past Medical History:  Diagnosis Date   AICD (automatic cardioverter/defibrillator) present    Anginal pain ([place])    Anxiety    Asthma    Atrial fibrillation-postoperative 11/28/2012   Cardiomyopathy    alcohol use related   CHF (congestive heart failure) (HCC)    Chronic back pain    Coronary artery disease    drug eluting stent RCA 2005-EF 30%- s/p CABG x 4; 2/4 patent grafts with SVG-PLOM and SVG-RCA system totally occluded. There are collaterals from the LAD system to the RCA and the RCA is totally occluded proximally. There are no collaterals to the LCx territory. He then underwent successful PCI of the mid left circumflex artery with overlapping Synergy drug-eluting stents   Depression    pt denies   Diabetes mellitus type II dx'd in the 1990's   GERD (gastroesophageal reflux disease)    yrs ago   H/O hiatal hernia    Hemorrhoids    History of hypogonadism    History of kidney stones    Hyperlipidemia    Hypertension    OSA on CPAP    "mask is broken; working on getting a new one" (01/15/2015; 10/03/2017)   Pneumonia    "3-4 times" (10/03/2017)   Urinary incontinence     Tobacco History: Social History   Tobacco Use  Smoking Status Never Smoker  Smokeless  Tobacco Current User   Types: Chew   Ready to quit: Not Answered Counseling given: Not Answered   Continue to not smoke  Outpatient Encounter Medications as of 01/27/2020  Medication Sig   ACCU-CHEK FASTCLIX LANCETS MISC Use as directed once a day.  Dx code: E11.9   albuterol (ACCUNEB) 0.63 MG/3ML nebulizer solution Take 3 mLs (0.63 mg total) by nebulization every 6 (six) hours as needed for wheezing.   albuterol (VENTOLIN HFA) 108 (90 Base) MCG/ACT inhaler Inhale 2 puffs into the lungs every 6 (six) hours as needed for wheezing or shortness of breath.    amoxicillin-clavulanate (AUGMENTIN) 875-125 MG tablet Take 1 tablet by mouth 2 (two) times daily.   atorvastatin (LIPITOR) 40 MG tablet TAKE 1 TABLET BY MOUTH AT BEDTIME.   blood glucose meter kit and supplies Dispense based on patient and insurance preference. Use as directed once a day. Dx Code E11.9.   Blood Glucose Monitoring Suppl (ACCU-CHEK GUIDE) w/Device KIT 1 each by Does not apply route daily. DX Code: E11.9   carvedilol (COREG) 6.25 MG tablet Take 1 tablet (6.25 mg total) by mouth 2 (two) times daily with a meal.  digoxin (LANOXIN) 0.125 MG tablet TAKE 1/2 TABLET BY MOUTH DAILY.   ELIQUIS 5 MG TABS tablet TAKE 1 TABLET BY MOUTH 2 TIMES DAILY.   furosemide (LASIX) 20 MG tablet Take 2 tablets (40 mg total) by mouth daily.   glucose blood (ACCU-CHEK GUIDE) test strip Use as directed once a day.  Dx code: E11.9   guaiFENesin (MUCINEX) 600 MG 12 hr tablet Take 1 tablet (600 mg total) by mouth 2 (two) times daily as needed for to loosen phlegm.   icosapent Ethyl (VASCEPA) 1 g capsule Take 2 capsules (2 g total) by mouth 2 (two) times daily.   metFORMIN (GLUCOPHAGE) 500 MG tablet Take 1 tablet (500 mg total) by mouth 2 (two) times daily with a meal.   mometasone-formoterol (DULERA) 200-5 MCG/ACT AERO Inhale 2 puffs into the lungs 2 (two) times a day.   nitroGLYCERIN (NITROSTAT) 0.4 MG SL tablet Place 1 tablet (0.4 mg  total) under the tongue every 5 (five) minutes as needed for chest pain.   OFEV 150 MG CAPS TAKE 1 CAPSULE BY MOUTH TWICE DAILY WITH FOOD. CALL [phone] FOR REFILLS   OXYGEN Inhale into the lungs. 3L prn    predniSONE (DELTASONE) 10 MG tablet TAKE 1 TABLET (10 MG TOTAL) BY MOUTH DAILY WITH BREAKFAST.   sacubitril-valsartan (ENTRESTO) 24-26 MG Take 1 tablet by mouth 2 (two) times daily.   spironolactone (ALDACTONE) 25 MG tablet Take 1 tablet (25 mg total) by mouth daily.   [DISCONTINUED] furosemide (LASIX) 20 MG tablet Take 2 tablets (40 mg total) by mouth every morning AND 1 tablet (20 mg total) every evening.   No facility-administered encounter medications on file as of 01/27/2020.     Review of Systems  Review of Systems  Constitutional: Positive for activity change and fatigue. Negative for chills, fever and unexpected weight change.  HENT: Negative for postnasal drip, rhinorrhea, sinus pressure, sinus pain and sore throat.   Eyes: Negative.   Respiratory: Positive for shortness of breath. Negative for cough and wheezing.   Cardiovascular: Negative for chest pain and palpitations.  Gastrointestinal: Negative for constipation, diarrhea, nausea and vomiting.  Endocrine: Negative.   Genitourinary: Negative.   Musculoskeletal: Negative.   Skin: Negative.   Neurological: Negative for dizziness and headaches.  Psychiatric/Behavioral: Negative.  Negative for dysphoric mood. The patient is not nervous/anxious.   All other systems reviewed and are negative.    Physical Exam  BP 110/66 (BP Location: Left Arm, Cuff Size: Normal)    Pulse 84    Temp 97.8 F (36.6 C) (Oral)    Ht _0  (1.753 m)    Wt 169 lb 6.4 oz (76.8 kg)    SpO2 99%    BMI 25.02 kg/m   Wt Readings from Last 5 Encounters:  01/27/20 169 lb 6.4 oz (76.8 kg)  01/26/20 170 lb 9.6 oz (77.4 kg)  01/26/20 170 lb (77.1 kg)  12/09/19 174 lb 3.2 oz (79 kg)  10/09/19 178 lb 9.6 oz (81 kg)    BMI Readings from Last  5 Encounters:  01/27/20 25.02 kg/m  01/26/20 24.48 kg/m  01/26/20 24.39 kg/m  12/09/19 25.00 kg/m  10/09/19 25.63 kg/m     Physical Exam Vitals and nursing note reviewed.  Constitutional:      General: He is not in acute distress.    Appearance: Normal appearance. He is normal weight.     Comments: Thin frail adult male  HENT:     Head: Normocephalic and atraumatic.  Right Ear: Hearing, tympanic membrane, ear canal and external ear normal. There is no impacted cerumen.     Left Ear: Hearing, tympanic membrane, ear canal and external ear normal. There is no impacted cerumen.     Nose: Nose normal. No mucosal edema or rhinorrhea.     Right Turbinates: Not enlarged.     Left Turbinates: Not enlarged.     Mouth/Throat:     Mouth: Mucous membranes are dry.     Pharynx: Oropharynx is clear. No oropharyngeal exudate.  Eyes:     Pupils: Pupils are equal, round, and reactive to light.  Cardiovascular:     Rate and Rhythm: Normal rate and regular rhythm.     Pulses: Normal pulses.     Heart sounds: Normal heart sounds. No murmur heard.   Pulmonary:     Effort: Pulmonary effort is normal.     Breath sounds: Rales (Bibasilar rales) present. No decreased breath sounds or wheezing.  Musculoskeletal:     Cervical back: Normal range of motion.     Right lower leg: No edema.     Left lower leg: No edema.  Lymphadenopathy:     Cervical: No cervical adenopathy.  Skin:    General: Skin is warm and dry.     Capillary Refill: Capillary refill takes less than 2 seconds.     Findings: No erythema or rash.  Neurological:     General: No focal deficit present.     Mental Status: He is alert and oriented to person, place, and time.     Motor: No weakness.     Coordination: Coordination normal.     Gait: Gait is intact. Gait normal.  Psychiatric:        Mood and Affect: Mood normal.        Behavior: Behavior normal. Behavior is cooperative.        Thought Content: Thought content  normal.        Judgment: Judgment normal.       Assessment & Plan:   CHF (congestive heart failure) (HCC) Plan: Continue to follow-up with heart failure clinic Continue cardiology medications Continue diuretics   Chronic respiratory failure with hypoxia ([place]) Plan: Requested walk today, patient declined We will order 6-minute walk Chest x-ray We will order overnight oximetry on CPAP and room air to further evaluate nighttime oxygen use  ILD (interstitial lung disease) ([place]) Plan: Chest x-ray today Requested walk, patient refused Continue Ofev 150 mg twice daily Lab work today Continue prednisone 6-minute walk ordered 6-week follow-up with [name] We will order spirometry with DLCO at next available opening  OSA (obstructive sleep apnea) Plan: Continue CPAP therapy Encourage patient to bring SD card to next office visit given discrepancy between his reported use as well as his DME companies  Dyspnea Suspect dyspnea is likely multifactorial.  Unable to fully evaluate as patient refused to be walked in our office today.  Appears euvolemic  Plan: Chest x-ray today Lab work today We will order 6-minute walk We will order spirometry with DLCO Close follow-up with [name] in ILD clinic    Return in about 2 months (around 03/29/2020), or if symptoms worsen or fail to improve, for Follow up with Dr. Purnell Shoemaker, ILD clinic - 32mn slot, Follow up for PFT - 357m, Spiro with DLCO.   BrLauraine RinneNP 01/27/2020   This appointment required 35 minutes of patient care (this includes precharting, chart review, review of results, face-to-face care, etc.).

## 2020-01-27 NOTE — Telephone Encounter (Signed)
-----  Message from Larey Dresser, MD sent at 01/26/2020  9:41 PM EDT ----- Hold Lasix x 1 day then decrease to 40 mg daily.  BMET 1 week.

## 2020-01-27 NOTE — Assessment & Plan Note (Signed)
Plan: Continue CPAP therapy Encourage patient to bring SD card to next office visit given discrepancy between his reported use as well as his DME companies

## 2020-01-27 NOTE — Assessment & Plan Note (Signed)
Plan: Requested walk today, patient declined We will order 6-minute walk Chest x-ray We will order overnight oximetry on CPAP and room air to further evaluate nighttime oxygen use

## 2020-01-27 NOTE — Assessment & Plan Note (Addendum)
Plan: Chest x-ray today Requested walk, patient refused Continue Ofev 150 mg twice daily Lab work today Continue prednisone 6-minute walk ordered 6-week follow-up with [name] We will order spirometry with DLCO at next available opening

## 2020-01-27 NOTE — Research (Signed)
65mVisit for Beat HF   Patient doing ok, on O2 at 3L wearing more than not. BP running low Dr MAundra Dubindecrased Coreg.    Current Outpatient Medications:  .  ACCU-CHEK FASTCLIX LANCETS MISC, Use as directed once a day.  Dx code: E11.9, Disp: 100 each, Rfl: 1 .  albuterol (ACCUNEB) 0.63 MG/3ML nebulizer solution, Take 3 mLs (0.63 mg total) by nebulization every 6 (six) hours as needed for wheezing., Disp: 75 mL, Rfl: 12 .  albuterol (VENTOLIN HFA) 108 (90 Base) MCG/ACT inhaler, Inhale 2 puffs into the lungs every 6 (six) hours as needed for wheezing or shortness of breath. , Disp: , Rfl:  .  amoxicillin-clavulanate (AUGMENTIN) 875-125 MG tablet, Take 1 tablet by mouth 2 (two) times daily., Disp: 28 tablet, Rfl: 0 .  atorvastatin (LIPITOR) 40 MG tablet, TAKE 1 TABLET BY MOUTH AT BEDTIME., Disp: 30 tablet, Rfl: 3 .  blood glucose meter kit and supplies, Dispense based on patient and insurance preference. Use as directed once a day. Dx Code E11.9., Disp: 1 each, Rfl: 0 .  Blood Glucose Monitoring Suppl (ACCU-CHEK GUIDE) w/Device KIT, 1 each by Does not apply route daily. DX Code: E11.9, Disp: 1 kit, Rfl: 0 .  carvedilol (COREG) 6.25 MG tablet, Take 1 tablet (6.25 mg total) by mouth 2 (two) times daily with a meal., Disp: 60 tablet, Rfl: 6 .  digoxin (LANOXIN) 0.125 MG tablet, TAKE 1/2 TABLET BY MOUTH DAILY., Disp: 45 tablet, Rfl: 3 .  ELIQUIS 5 MG TABS tablet, TAKE 1 TABLET BY MOUTH 2 TIMES DAILY., Disp: 180 tablet, Rfl: 3 .  furosemide (LASIX) 20 MG tablet, Take 2 tablets (40 mg total) by mouth every morning AND 1 tablet (20 mg total) every evening., Disp: 60 tablet, Rfl: 5 .  glucose blood (ACCU-CHEK GUIDE) test strip, Use as directed once a day.  Dx code: E11.9, Disp: 100 each, Rfl: 1 .  guaiFENesin (MUCINEX) 600 MG 12 hr tablet, Take 1 tablet (600 mg total) by mouth 2 (two) times daily as needed for to loosen phlegm., Disp: , Rfl:  .  icosapent Ethyl (VASCEPA) 1 g capsule, Take 2 capsules (2 g  total) by mouth 2 (two) times daily., Disp: 120 capsule, Rfl: 6 .  metFORMIN (GLUCOPHAGE) 500 MG tablet, Take 1 tablet (500 mg total) by mouth 2 (two) times daily with a meal., Disp: 180 tablet, Rfl: 3 .  mometasone-formoterol (DULERA) 200-5 MCG/ACT AERO, Inhale 2 puffs into the lungs 2 (two) times a day., Disp: 2 Inhaler, Rfl: 0 .  nitroGLYCERIN (NITROSTAT) 0.4 MG SL tablet, Place 1 tablet (0.4 mg total) under the tongue every 5 (five) minutes as needed for chest pain., Disp: 25 tablet, Rfl: 3 .  OFEV 150 MG CAPS, TAKE 1 CAPSULE BY MOUTH TWICE DAILY WITH FOOD. CALL [phone] REFILLS, Disp: 60 capsule, Rfl: 5 .  OXYGEN, Inhale into the lungs. 3L prn , Disp: , Rfl:  .  predniSONE (DELTASONE) 10 MG tablet, TAKE 1 TABLET (10 MG TOTAL) BY MOUTH DAILY WITH BREAKFAST., Disp: 90 tablet, Rfl: 1 .  sacubitril-valsartan (ENTRESTO) 24-26 MG, Take 1 tablet by mouth 2 (two) times daily., Disp: 60 tablet, Rfl: 5 .  spironolactone (ALDACTONE) 25 MG tablet, Take 1 tablet (25 mg total) by mouth daily., Disp: 90 tablet, Rfl: 3

## 2020-01-27 NOTE — Assessment & Plan Note (Signed)
Suspect dyspnea is likely multifactorial.  Unable to fully evaluate as patient refused to be walked in our office today.  Appears euvolemic  Plan: Chest x-ray today Lab work today We will order 6-minute walk We will order spirometry with DLCO Close follow-up with [name] in ILD clinic

## 2020-01-27 NOTE — Telephone Encounter (Signed)
Pt aware of results and recommendations. Advised to repeat in 1 week pt will go locally,. RX mailed to patient.

## 2020-01-27 NOTE — Assessment & Plan Note (Signed)
Plan: Continue to follow-up with heart failure clinic Continue cardiology medications Continue diuretics

## 2020-01-27 NOTE — Patient Instructions (Addendum)
You were seen today by Lauraine Rinne, NP  for:   1. Shortness of breath  - DG Chest 2 View; Future - Comp Met (CMET); Future - Hepatic function panel; Future  We would like to walk in office today, you declined  We will set you up for a 6-minute walk to further evaluate your oxygen needs  We will order a chest x-ray as well as lab work today  We will also order a spirometry with DLCO to further evaluate your lung functioning  2. Chronic systolic congestive heart failure (HCC)  Keep follow-up with heart failure team  Continue medications as managed by the heart failure team  3. Chronic respiratory failure with hypoxia (HCC)  Continue oxygen therapy as prescribed  >>>maintain oxygen saturations greater than 88 percent  >>>if unable to maintain oxygen saturations please contact the office  >>>do not smoke with oxygen  >>>can use nasal saline gel or nasal saline rinses to moisturize nose if oxygen causes dryness  We will order an overnight oximetry test to be completed with you using a CPAP to see if you need oxygen at night   We would like to walk in office today, you declined  We will get you set up for a 6-minute walk  4. ILD (interstitial lung disease) ([place])  - Pulmonary function test; Future - DG Chest 2 View; Future  Continue Ofev  Continue prednisone  Lab work today  We will order a spirometry with DLCO which is a breathing test with your next follow-up with [name]  5. OSA (obstructive sleep apnea)  We contacted your DME company [employer].  They reported that they are not showing any records that you are currently using your CPAP.  Please bring your SD card to the next office visit so we can obtain a download from your machine  We recommend that you continue using your CPAP daily >>>Keep up the hard work using your device >>> Goal should be wearing this for the entire night that you are sleeping, at least 4 to 6 hours  Remember:  . Do not drive or  operate heavy machinery if tired or drowsy.  . Please notify the supply company and office if you are unable to use your device regularly due to missing supplies or machine being broken.  . Work on maintaining a healthy weight and following your recommended nutrition plan  . Maintain proper daily exercise and movement  . Maintaining proper use of your device can also help improve management of other chronic illnesses such as: Blood pressure, blood sugars, and weight management.   BiPAP/ CPAP Cleaning:  >>>Clean weekly, with Dawn soap, and bottle brush.  Set up to air dry. >>> Wipe mask out daily with wet wipe or towelette     We recommend today:  Orders Placed This Encounter  Procedures  . DG Chest 2 View    Standing Status:   Future    Standing Expiration Date:   05/29/2020    Order Specific Question:   Reason for Exam (SYMPTOM  OR DIAGNOSIS REQUIRED)    Answer:   DOE, ILD    Order Specific Question:   Preferred imaging location?    Answer:   Internal    Order Specific Question:   Radiology Contrast Protocol - do NOT remove file path    Answer:   [email or web address]  . Comp Met (CMET)    Standing Status:   Future    Standing Expiration Date:   01/26/2021  .  Hepatic function panel    Standing Status:   Future    Standing Expiration Date:   01/26/2021  . Pulse oximetry, overnight    Standing Status:   Future    Standing Expiration Date:   01/26/2021    Scheduling Instructions:     On RA on CPAP  . Pulmonary function test    Spirometry with DLCO    Standing Status:   Future    Standing Expiration Date:   01/26/2021    Order Specific Question:   Where should this test be performed?    Answer:   [facility]  . 6 minute walk    Standing Status:   Future    Standing Expiration Date:   01/26/2021    Order Specific Question:   Where should this test be performed?    Answer:   [facility]   Orders Placed This Encounter  Procedures  . DG Chest 2  View  . Comp Met (CMET)  . Hepatic function panel  . Pulse oximetry, overnight  . Pulmonary function test  . 6 minute walk   No orders of the defined types were placed in this encounter.   Follow Up:    Return in about 2 months (around 03/29/2020), or if symptoms worsen or fail to improve, for Follow up with Dr. Purnell Shoemaker, ILD clinic - 93mn slot, Follow up for PFT - 372m, Spiro with DLCO.  Please schedule 30-minute spirometry with DLCO -next available Please schedule 6-minute walk prior to next office visit with Dr. RaChase Caller     Please do your part to reduce the spread of COVID-19:      Reduce your risk of any infection  and COVID19 by using the similar precautions used for avoiding the common cold or flu:  . Marland Kitchenash your hands often with soap and warm water for at least 20 seconds.  If soap and water are not readily available, use an alcohol-based hand sanitizer with at least 60% alcohol.  . If coughing or sneezing, cover your mouth and nose by coughing or sneezing into the elbow areas of your shirt or coat, into a tissue or into your sleeve (not your hands). . Langley Gauss MASK when in public  . Avoid shaking hands with others and consider head nods or verbal greetings only. . Avoid touching your eyes, nose, or mouth with unwashed hands.  . Avoid close contact with people who are sick. . Avoid places or events with large numbers of people in one location, like concerts or sporting events. . If you have some symptoms but not all symptoms, continue to monitor at home and seek medical attention if your symptoms worsen. . If you are having a medical emergency, call 911.   [facility] e-Visit: [email or web address]       [facility]: [address] [phone]                 [facility]: [phone]   It is flu  season:   >>> Best ways to protect herself from the flu: Receive the yearly flu vaccine, practice good hand hygiene washing with soap and also using hand sanitizer when available, eat a nutritious meals, get adequate rest, hydrate appropriately   Please contact the office if your symptoms worsen or you have concerns that you are not improving.   Thank you for choosing [facility] for your healthcare, and for allowing [place] to partner with you on your healthcare journey. I am thankful to be able to provide care to you today.   Wyn Quaker FNP-C

## 2020-01-28 ENCOUNTER — Other Ambulatory Visit: Payer: Self-pay | Admitting: Pulmonary Disease

## 2020-01-28 DIAGNOSIS — R918 Other nonspecific abnormal finding of lung field: Secondary | ICD-10-CM

## 2020-01-28 DIAGNOSIS — J849 Interstitial pulmonary disease, unspecified: Secondary | ICD-10-CM

## 2020-01-28 NOTE — Progress Notes (Signed)
Spoke with pt and notified of results per Aaron Edelman. Pt verbalized understanding and denied any questions. He has seen nephrology in the past and I advised that he call them for f/u now. He verbalized understanding.

## 2020-01-28 NOTE — Progress Notes (Signed)
Spoke with pt and notified of results per Brian. Pt verbalized understanding and denied any questions. 

## 2020-02-02 MED FILL — ENTRESTO 24 MG-26 MG TABLET: 24-26 | 30 days supply | Qty: 60 | Fill #4

## 2020-02-02 MED FILL — ELIQUIS 5 MG TABLET: 5 | 90 days supply | Qty: 180 | Fill #2

## 2020-02-03 DIAGNOSIS — I509 Heart failure, unspecified: Secondary | ICD-10-CM | POA: Diagnosis not present

## 2020-02-03 DIAGNOSIS — I2510 Atherosclerotic heart disease of native coronary artery without angina pectoris: Secondary | ICD-10-CM | POA: Diagnosis not present

## 2020-02-06 ENCOUNTER — Ambulatory Visit (INDEPENDENT_AMBULATORY_CARE_PROVIDER_SITE_OTHER)
Admission: RE | Admit: 2020-02-06 | Discharge: 2020-02-06 | Disposition: A | Discharge status: Home / Self Care | Payer: Medicare HMO | Source: Ambulatory Visit | Attending: Pulmonary Disease | Admitting: Pulmonary Disease

## 2020-02-06 ENCOUNTER — Other Ambulatory Visit: Payer: Self-pay

## 2020-02-06 DIAGNOSIS — J849 Interstitial pulmonary disease, unspecified: Secondary | ICD-10-CM

## 2020-02-06 DIAGNOSIS — R918 Other nonspecific abnormal finding of lung field: Secondary | ICD-10-CM | POA: Diagnosis not present

## 2020-02-06 DIAGNOSIS — I700 Atherosclerosis of aorta: Secondary | ICD-10-CM | POA: Diagnosis not present

## 2020-02-07 DIAGNOSIS — J9611 Chronic respiratory failure with hypoxia: Secondary | ICD-10-CM | POA: Diagnosis not present

## 2020-02-07 DIAGNOSIS — J849 Interstitial pulmonary disease, unspecified: Secondary | ICD-10-CM | POA: Diagnosis not present

## 2020-02-07 DIAGNOSIS — G4733 Obstructive sleep apnea (adult) (pediatric): Secondary | ICD-10-CM | POA: Diagnosis not present

## 2020-02-13 ENCOUNTER — Ambulatory Visit: Payer: Medicare HMO | Admitting: Podiatry

## 2020-02-13 ENCOUNTER — Other Ambulatory Visit: Payer: Self-pay

## 2020-02-13 DIAGNOSIS — L97511 Non-pressure chronic ulcer of other part of right foot limited to breakdown of skin: Secondary | ICD-10-CM | POA: Diagnosis not present

## 2020-02-13 DIAGNOSIS — E1165 Type 2 diabetes mellitus with hyperglycemia: Secondary | ICD-10-CM

## 2020-02-17 ENCOUNTER — Telehealth: Payer: Self-pay | Admitting: Pulmonary Disease

## 2020-02-17 ENCOUNTER — Encounter: Payer: Self-pay | Admitting: Podiatry

## 2020-02-17 NOTE — Telephone Encounter (Signed)
Works for me.  Wyn Quaker, FNP

## 2020-02-17 NOTE — Progress Notes (Signed)
°Subjective:  °Patient ID: Vincent Chandler, male    DOB: 03/21/1961,  MRN: [id number] ° °Chief Complaint  °Patient presents with  °• Foot Ulcer  °  pt is here for an ulcer of the right second toe.  ° ° °59 y.o. male presents for wound care.  Patient presents for follow-up of right second digit erythema.  Patient has completed doxycycline.  Patient denies any acute complaint.  Patient denies there is no ulceration present. ° °Review of Systems: Negative except as noted in the HPI. Denies N/V/F/Ch. ° °Past Medical History:  °Diagnosis Date  °• AICD (automatic cardioverter/defibrillator) present   °• Anginal pain (HCC)   °• Anxiety   °• Asthma   °• Atrial fibrillation-postoperative 11/28/2012  °• Cardiomyopathy   ° alcohol use related  °• CHF (congestive heart failure) (HCC)   °• Chronic back pain   °• Coronary artery disease   ° drug eluting stent RCA 2005-EF 30%- s/p CABG x 4; 2/4 patent grafts with SVG-PLOM and SVG-RCA system totally occluded.  There are collaterals from the LAD system to the RCA and the RCA is totally occluded proximally.  There are no collaterals to the LCx territory.  He then underwent successful PCI of the mid left circumflex artery with overlapping Synergy drug-eluting stents  °• Depression   ° pt denies  °• Diabetes mellitus type II dx'd in the 1990's  °• GERD (gastroesophageal reflux disease)   ° yrs ago  °• H/O hiatal hernia   °• Hemorrhoids   °• History of hypogonadism   °• History of kidney stones   °• Hyperlipidemia   °• Hypertension   °• OSA on CPAP   ° "mask is broken; working on getting a new one" (01/15/2015; 10/03/2017)  °• Pneumonia   ° "3-4 times" (10/03/2017)  °• Urinary incontinence   ° ° °Current Outpatient Medications:  °•  ACCU-CHEK FASTCLIX LANCETS MISC, Use as directed once a day.  Dx code: E11.9, Disp: 100 each, Rfl: 1 °•  albuterol (ACCUNEB) 0.63 MG/3ML nebulizer solution, Take 3 mLs (0.63 mg total) by nebulization every 6 (six) hours as needed for wheezing., Disp: 75 mL, Rfl:  12 °•  albuterol (VENTOLIN HFA) 108 (90 Base) MCG/ACT inhaler, Inhale 2 puffs into the lungs every 6 (six) hours as needed for wheezing or shortness of breath. , Disp: , Rfl:  °•  amoxicillin-clavulanate (AUGMENTIN) 875-125 MG tablet, Take 1 tablet by mouth 2 (two) times daily., Disp: 28 tablet, Rfl: 0 °•  atorvastatin (LIPITOR) 40 MG tablet, TAKE 1 TABLET BY MOUTH AT BEDTIME., Disp: 30 tablet, Rfl: 3 °•  blood glucose meter kit and supplies, Dispense based on patient and insurance preference. Use as directed once a day. Dx Code E11.9., Disp: 1 each, Rfl: 0 °•  Blood Glucose Monitoring Suppl (ACCU-CHEK GUIDE) w/Device KIT, 1 each by Does not apply route daily. DX Code: E11.9, Disp: 1 kit, Rfl: 0 °•  carvedilol (COREG) 6.25 MG tablet, Take 1 tablet (6.25 mg total) by mouth 2 (two) times daily with a meal., Disp: 60 tablet, Rfl: 6 °•  digoxin (LANOXIN) 0.125 MG tablet, TAKE 1/2 TABLET BY MOUTH DAILY., Disp: 45 tablet, Rfl: 3 °•  ELIQUIS 5 MG TABS tablet, TAKE 1 TABLET BY MOUTH 2 TIMES DAILY., Disp: 180 tablet, Rfl: 3 °•  furosemide (LASIX) 20 MG tablet, Take 2 tablets (40 mg total) by mouth daily., Disp: 60 tablet, Rfl: 5 °•  glucose blood (ACCU-CHEK GUIDE) test strip, Use as directed once a day.  Dx code:   Dx code: E11.9, Disp: 100 each, Rfl: 1   guaiFENesin (MUCINEX) 600 MG 12 hr tablet, Take 1 tablet (600 mg total) by mouth 2 (two) times daily as needed for to loosen phlegm., Disp: , Rfl:    icosapent Ethyl (VASCEPA) 1 g capsule, Take 2 capsules (2 g total) by mouth 2 (two) times daily., Disp: 120 capsule, Rfl: 6   metFORMIN (GLUCOPHAGE) 500 MG tablet, Take 1 tablet (500 mg total) by mouth 2 (two) times daily with a meal., Disp: 180 tablet, Rfl: 3   mometasone-formoterol (DULERA) 200-5 MCG/ACT AERO, Inhale 2 puffs into the lungs 2 (two) times a day., Disp: 2 Inhaler, Rfl: 0   nitroGLYCERIN (NITROSTAT) 0.4 MG SL tablet, Place 1 tablet (0.4 mg total) under the tongue every 5 (five) minutes as needed for chest pain.,  Disp: 25 tablet, Rfl: 3   OFEV 150 MG CAPS, TAKE 1 CAPSULE BY MOUTH TWICE DAILY WITH FOOD. CALL [phone] FOR REFILLS, Disp: 60 capsule, Rfl: 5   OXYGEN, Inhale into the lungs. 3L prn , Disp: , Rfl:    predniSONE (DELTASONE) 10 MG tablet, TAKE 1 TABLET (10 MG TOTAL) BY MOUTH DAILY WITH BREAKFAST., Disp: 90 tablet, Rfl: 1   sacubitril-valsartan (ENTRESTO) 24-26 MG, Take 1 tablet by mouth 2 (two) times daily., Disp: 60 tablet, Rfl: 5   spironolactone (ALDACTONE) 25 MG tablet, Take 1 tablet (25 mg total) by mouth daily., Disp: 90 tablet, Rfl: 3  Social History   Tobacco Use  Smoking Status Never Smoker  Smokeless Tobacco Current User   Types: Chew    No Known Allergies Objective:  There were no vitals filed for this visit. There is no height or weight on file to calculate BMI. Constitutional Well developed. Well nourished.  Vascular Dorsalis pedis pulses palpable bilaterally. Posterior tibial pulses palpable bilaterally. Capillary refill normal to all digits.  No cyanosis or clubbing noted. Pedal hair growth normal.  Neurologic Normal speech. Oriented to person, place, and time. Protective sensation absent  Dermatologic  right lateral fifth metatarsal head wound completely reepithelialized without any underlying ulceration.  No clinical signs of infection noted there.  Right second digit no erythema without underlying ulceration, crepitus, abscess formation palpated.  No ulcers noted.  Orthopedic: No pain to palpation either foot.   Radiographs: None Assessment:   1. Right foot ulcer, limited to breakdown of skin ([place])   2. Uncontrolled type 2 diabetes mellitus with hyperglycemia ([place])    Plan:  Patient was evaluated and treated and all questions answered.  Ulcer right fifth metatarsal head with partial thickness. -Completely healed.  No underlying ulceration noted.  Continue applying triple antibiotic and Band-Aid for next week followed by discontinuation of any  application or dressing changes.  Patient states understanding. -He may begin transitioning to regular sneakers.  If there is any frictional blister of any other acute complaints or ulceration come back and see me right away.   Right second digit soft tissue infection -Clinically healed.  The redness has improved considerably on doxycycline.  Patient has completed her course.  Patient will not need any further doxycycline. -I have asked the patient to keep a close eye on it if the infection gets worse or reoccurs to come back and see me or go to the emergency room right away.  Patient states understanding. No follow-ups on file.

## 2020-02-17 NOTE — Progress Notes (Signed)
ATC x1.  VM left to return call.

## 2020-02-17 NOTE — Telephone Encounter (Signed)
Progression of patient's interstitial lung disease. Patient needs follow-up with [name] in a 30-minute time slot. I have CCed him on the message.  Wyn Quaker, FNP ----------  Pt is scheduled to have a PFT on 9/27 and follow up with MR after.  Will this be an appropriate follow up or do we need to move appt sooner?

## 2020-02-18 ENCOUNTER — Ambulatory Visit: Payer: Medicare HMO | Admitting: Podiatry

## 2020-02-18 ENCOUNTER — Ambulatory Visit: Payer: Medicare HMO | Admitting: Internal Medicine

## 2020-02-18 NOTE — Telephone Encounter (Signed)
Called spoke with patient let him know this appointment was appropriate, nothing further needed at this time.

## 2020-02-19 ENCOUNTER — Other Ambulatory Visit (HOSPITAL_COMMUNITY): Payer: Self-pay | Admitting: Cardiology

## 2020-02-19 DIAGNOSIS — L97519 Non-pressure chronic ulcer of other part of right foot with unspecified severity: Secondary | ICD-10-CM

## 2020-02-25 ENCOUNTER — Other Ambulatory Visit: Payer: Self-pay

## 2020-02-25 ENCOUNTER — Ambulatory Visit (HOSPITAL_COMMUNITY)
Admission: RE | Admit: 2020-02-25 | Discharge: 2020-02-25 | Disposition: A | Discharge status: Home / Self Care | Payer: Medicare HMO | Source: Ambulatory Visit | Attending: Internal Medicine | Admitting: Internal Medicine

## 2020-02-25 DIAGNOSIS — L97519 Non-pressure chronic ulcer of other part of right foot with unspecified severity: Secondary | ICD-10-CM | POA: Insufficient documentation

## 2020-03-02 NOTE — Research (Signed)
Pt having some dizziness. Decreased the device a little.   Pt couldn't do the 6MWT due to his foot is hurting.   All questionaires completed by Doroteo Bradford.

## 2020-03-02 NOTE — Research (Signed)
34M visit   Pt doing well, no complaints  Increased device a little bit.  No symptoms.

## 2020-03-03 ENCOUNTER — Encounter (HOSPITAL_COMMUNITY): Payer: Self-pay

## 2020-03-04 MED FILL — ENTRESTO 24 MG-26 MG TABLET: 24-26 | 30 days supply | Qty: 60 | Fill #5

## 2020-03-04 MED FILL — ATORVASTATIN 40 MG TABLET: 40 | 30 days supply | Qty: 30 | Fill #2

## 2020-03-04 MED FILL — DIGOXIN 0.125 MG TABLET: 125 | 90 days supply | Qty: 45 | Fill #2

## 2020-03-05 DIAGNOSIS — I2510 Atherosclerotic heart disease of native coronary artery without angina pectoris: Secondary | ICD-10-CM | POA: Diagnosis not present

## 2020-03-05 DIAGNOSIS — I509 Heart failure, unspecified: Secondary | ICD-10-CM | POA: Diagnosis not present

## 2020-03-09 DIAGNOSIS — J849 Interstitial pulmonary disease, unspecified: Secondary | ICD-10-CM | POA: Diagnosis not present

## 2020-03-09 DIAGNOSIS — G4733 Obstructive sleep apnea (adult) (pediatric): Secondary | ICD-10-CM | POA: Diagnosis not present

## 2020-03-09 DIAGNOSIS — J9611 Chronic respiratory failure with hypoxia: Secondary | ICD-10-CM | POA: Diagnosis not present

## 2020-03-18 ENCOUNTER — Ambulatory Visit: Payer: Medicare HMO | Admitting: Internal Medicine

## 2020-03-24 ENCOUNTER — Telehealth (HOSPITAL_COMMUNITY): Payer: Self-pay

## 2020-03-24 ENCOUNTER — Ambulatory Visit: Payer: Medicare HMO | Admitting: Podiatry

## 2020-03-24 ENCOUNTER — Other Ambulatory Visit: Payer: Self-pay

## 2020-03-24 ENCOUNTER — Ambulatory Visit (INDEPENDENT_AMBULATORY_CARE_PROVIDER_SITE_OTHER): Payer: Medicare HMO

## 2020-03-24 DIAGNOSIS — L97522 Non-pressure chronic ulcer of other part of left foot with fat layer exposed: Secondary | ICD-10-CM

## 2020-03-24 DIAGNOSIS — E1165 Type 2 diabetes mellitus with hyperglycemia: Secondary | ICD-10-CM

## 2020-03-24 DIAGNOSIS — L539 Erythematous condition, unspecified: Secondary | ICD-10-CM | POA: Diagnosis not present

## 2020-03-24 MED ORDER — DOXYCYCLINE HYCLATE 100 MG PO TABS: 100.0000 mg | ORAL_TABLET | Freq: Two times a day (BID) | ORAL | 0 refills | Status: DC

## 2020-03-24 NOTE — Telephone Encounter (Signed)
Patients states he really isn't experiencing any sob

## 2020-03-24 NOTE — Telephone Encounter (Signed)
Patient called wanting to know he could be seen this week due to him having some gurgling sounds in his chest when he breathes. He reports experiencing productive coughing,fatigue and denies swelling,weight gain,headaches. Patient still currently taking diuretic as prescribed. He has been taking Robitussin,i advised patient that she should be seen by pcp or at an urgent care. Patient said ok, any other suggestions? Please advise

## 2020-03-24 NOTE — Telephone Encounter (Signed)
If he is more short of breath than baseline, get him in with me or PA/NP soon as long as he does not have fever.

## 2020-03-25 ENCOUNTER — Encounter: Payer: Self-pay | Admitting: Podiatry

## 2020-03-25 NOTE — Progress Notes (Signed)
Subjective:  Patient ID: Vincent Chandler, male    DOB: 1961/02/02,  MRN: [id number]  Chief Complaint  Patient presents with  . Wound Check    Left 1st interdigital wound, draining pus, redness noted. Pt states 1 week duration. Denies fever/nausea/vomiting/chills.    59 y.o. male presents for wound care.  Patient presents with complaint of a new wound to the left hallux lateral aspect.  Patient states that he does not know how it started but it started about 1 week over that it became red hot swollen.  There is redness up to the metatarsophalangeal joint.  He is a diabetic with last A1c of 6.9.  He does not recall the injury or any changes in shoe gear modification likely led to this ulceration.  He would like to discuss treatment options.  There has been some serosanguineous drainage as well.  He does not have any pain due to neuropathy   Review of Systems: Negative except as noted in the HPI. Denies N/V/F/Ch.  Past Medical History:  Diagnosis Date  . AICD (automatic cardioverter/defibrillator) present   . Anginal pain ([place])   . Anxiety   . Asthma   . Atrial fibrillation-postoperative 11/28/2012  . Cardiomyopathy    alcohol use related  . CHF (congestive heart failure) ([place])   . Chronic back pain   . Coronary artery disease    drug eluting stent RCA 2005-EF 30%- s/p CABG x 4; 2/4 patent grafts with SVG-PLOM and SVG-RCA system totally occluded. There are collaterals from the LAD system to the RCA and the RCA is totally occluded proximally. There are no collaterals to the LCx territory. He then underwent successful PCI of the mid left circumflex artery with overlapping Synergy drug-eluting stents  . Depression    pt denies  . Diabetes mellitus type II dx'd in the 1990's  . GERD (gastroesophageal reflux disease)    yrs ago  . H/O hiatal hernia   . Hemorrhoids   . History of hypogonadism   . History of kidney stones   . Hyperlipidemia   . Hypertension   . OSA on CPAP    "mask is  broken; working on getting a new one" (01/15/2015; 10/03/2017)  . Pneumonia    "3-4 times" (10/03/2017)  . Urinary incontinence     Current Outpatient Medications:  .  ACCU-CHEK FASTCLIX LANCETS MISC, Use as directed once a day.  Dx code: E11.9, Disp: 100 each, Rfl: 1 .  albuterol (ACCUNEB) 0.63 MG/3ML nebulizer solution, Take 3 mLs (0.63 mg total) by nebulization every 6 (six) hours as needed for wheezing., Disp: 75 mL, Rfl: 12 .  albuterol (VENTOLIN HFA) 108 (90 Base) MCG/ACT inhaler, Inhale 2 puffs into the lungs every 6 (six) hours as needed for wheezing or shortness of breath. , Disp: , Rfl:  .  amoxicillin-clavulanate (AUGMENTIN) 875-125 MG tablet, Take 1 tablet by mouth 2 (two) times daily., Disp: 28 tablet, Rfl: 0 .  atorvastatin (LIPITOR) 40 MG tablet, TAKE 1 TABLET BY MOUTH AT BEDTIME., Disp: 30 tablet, Rfl: 3 .  blood glucose meter kit and supplies, Dispense based on patient and insurance preference. Use as directed once a day. Dx Code E11.9., Disp: 1 each, Rfl: 0 .  Blood Glucose Monitoring Suppl (ACCU-CHEK GUIDE) w/Device KIT, 1 each by Does not apply route daily. DX Code: E11.9, Disp: 1 kit, Rfl: 0 .  carvedilol (COREG) 6.25 MG tablet, Take 1 tablet (6.25 mg total) by mouth 2 (two) times daily with a meal., Disp:  60 tablet, Rfl: 6 .  digoxin (LANOXIN) 0.125 MG tablet, TAKE 1/2 TABLET BY MOUTH DAILY., Disp: 45 tablet, Rfl: 3 .  ELIQUIS 5 MG TABS tablet, TAKE 1 TABLET BY MOUTH 2 TIMES DAILY., Disp: 180 tablet, Rfl: 3 .  furosemide (LASIX) 20 MG tablet, Take 2 tablets (40 mg total) by mouth daily., Disp: 60 tablet, Rfl: 5 .  glucose blood (ACCU-CHEK GUIDE) test strip, Use as directed once a day.  Dx code: E11.9, Disp: 100 each, Rfl: 1 .  guaiFENesin (MUCINEX) 600 MG 12 hr tablet, Take 1 tablet (600 mg total) by mouth 2 (two) times daily as needed for to loosen phlegm., Disp: , Rfl:  .  icosapent Ethyl (VASCEPA) 1 g capsule, Take 2 capsules (2 g total) by mouth 2 (two) times daily., Disp:  120 capsule, Rfl: 6 .  metFORMIN (GLUCOPHAGE) 500 MG tablet, Take 1 tablet (500 mg total) by mouth 2 (two) times daily with a meal., Disp: 180 tablet, Rfl: 3 .  mometasone-formoterol (DULERA) 200-5 MCG/ACT AERO, Inhale 2 puffs into the lungs 2 (two) times a day., Disp: 2 Inhaler, Rfl: 0 .  nitroGLYCERIN (NITROSTAT) 0.4 MG SL tablet, Place 1 tablet (0.4 mg total) under the tongue every 5 (five) minutes as needed for chest pain., Disp: 25 tablet, Rfl: 3 .  OFEV 150 MG CAPS, TAKE 1 CAPSULE BY MOUTH TWICE DAILY WITH FOOD. CALL [phone] FOR REFILLS, Disp: 60 capsule, Rfl: 5 .  OXYGEN, Inhale into the lungs. 3L prn , Disp: , Rfl:  .  predniSONE (DELTASONE) 10 MG tablet, TAKE 1 TABLET (10 MG TOTAL) BY MOUTH DAILY WITH BREAKFAST., Disp: 90 tablet, Rfl: 1 .  sacubitril-valsartan (ENTRESTO) 24-26 MG, Take 1 tablet by mouth 2 (two) times daily., Disp: 60 tablet, Rfl: 5 .  spironolactone (ALDACTONE) 25 MG tablet, Take 1 tablet (25 mg total) by mouth daily., Disp: 90 tablet, Rfl: 3 .  doxycycline (VIBRA-TABS) 100 MG tablet, Take 1 tablet (100 mg total) by mouth 2 (two) times daily., Disp: 28 tablet, Rfl: 0  Social History   Tobacco Use  Smoking Status Never Smoker  Smokeless Tobacco Current User  . Types: Chew    No Known Allergies Objective:  There were no vitals filed for this visit. There is no height or weight on file to calculate BMI. Constitutional Well developed. Well nourished.  Vascular Dorsalis pedis pulses palpable bilaterally. Posterior tibial pulses palpable bilaterally. Capillary refill normal to all digits.  No cyanosis or clubbing noted. Pedal hair growth normal.  Neurologic Normal speech. Oriented to person, place, and time. Protective sensation absent  Dermatologic Wound Location: Left hallux lateral aspect wound with fat layer exposed Wound Base: Mixed Granular/Fibrotic Peri-wound: Reddened Exudate: Scant/small amount Serosanguinous exudate Wound Measurements: -See  below  Orthopedic: No pain to palpation either foot.   Radiographs: 3 views of skeletally mature the left foot: No signs of osteomyelitis noted.  No cortical destruction noted.  No soft tissue emphysema noted. Assessment:   1. Skin ulcer of toe of left foot with fat layer exposed ([place])   2. Erythema   3. Uncontrolled type 2 diabetes mellitus with hyperglycemia ([place])    Plan:  Patient was evaluated and treated and all questions answered.  Ulcer left hallux lateral aspect with fat layer exposed -Debridement as below. -Dressed with Betadine wet-to-dry, DSD. -Continue off-loading with surgical shoe. -Doxycycline was dispensed for skin and soft tissue prophylaxis for next 14 days.  I encouraged patient that if the wound gets worse of  the redness goes past the metatarsophalangeal joint to go to the emergency room right away for IV antibiotics and wound management.  Patient states understanding  Procedure: Excisional Debridement of Wound Tool: Sharp chisel blade/tissue nipper Rationale: Removal of non-viable soft tissue from the wound to promote healing.  Anesthesia: none Pre-Debridement Wound Measurements: 0.4 cm x 0.3 cm x 0.3 cm Post-Debridement Wound Measurements: 0.5 cm x 0.3 cm x 0.3 cm Type of Debridement: Sharp Excisional Tissue Removed: Non-viable soft tissue Blood loss: Minimal (<50cc) Depth of Debridement: subcutaneous tissue. Technique: Sharp excisional debridement to bleeding, viable wound base.  Wound Progress: This is my initial evaluation.  I will continue to monitor the progression of it. Site healing conversation 7 Dressing: Dry, sterile, compression dressing. Disposition: Patient tolerated procedure well. Patient to return in 1 week for follow-up.  No follow-ups on file.

## 2020-03-29 ENCOUNTER — Ambulatory Visit: Payer: Medicare HMO | Admitting: Pulmonary Disease

## 2020-03-29 ENCOUNTER — Ambulatory Visit: Payer: Medicare HMO | Admitting: Internal Medicine

## 2020-04-02 ENCOUNTER — Other Ambulatory Visit: Payer: Self-pay

## 2020-04-02 ENCOUNTER — Ambulatory Visit (INDEPENDENT_AMBULATORY_CARE_PROVIDER_SITE_OTHER): Payer: Medicare HMO | Admitting: Podiatry

## 2020-04-02 DIAGNOSIS — S90412A Abrasion, left great toe, initial encounter: Secondary | ICD-10-CM | POA: Diagnosis not present

## 2020-04-02 DIAGNOSIS — E1165 Type 2 diabetes mellitus with hyperglycemia: Secondary | ICD-10-CM | POA: Diagnosis not present

## 2020-04-02 DIAGNOSIS — L97522 Non-pressure chronic ulcer of other part of left foot with fat layer exposed: Secondary | ICD-10-CM | POA: Diagnosis not present

## 2020-04-02 DIAGNOSIS — L97521 Non-pressure chronic ulcer of other part of left foot limited to breakdown of skin: Secondary | ICD-10-CM

## 2020-04-04 DIAGNOSIS — I509 Heart failure, unspecified: Secondary | ICD-10-CM | POA: Diagnosis not present

## 2020-04-04 DIAGNOSIS — I2510 Atherosclerotic heart disease of native coronary artery without angina pectoris: Secondary | ICD-10-CM | POA: Diagnosis not present

## 2020-04-05 ENCOUNTER — Other Ambulatory Visit (HOSPITAL_COMMUNITY): Payer: Self-pay | Admitting: Cardiology

## 2020-04-05 MED FILL — METFORMIN HCL 500 MG TABS: 500 | 90 days supply | Qty: 180 | Fill #1

## 2020-04-05 MED FILL — CARVEDILOL 25 MG TABLET: 25 | 90 days supply | Qty: 180 | Fill #1

## 2020-04-05 MED FILL — ATORVASTATIN 40 MG TABLET: 40 | 30 days supply | Qty: 30 | Fill #3

## 2020-04-06 ENCOUNTER — Other Ambulatory Visit (HOSPITAL_COMMUNITY): Payer: Self-pay | Admitting: Cardiology

## 2020-04-06 ENCOUNTER — Telehealth: Payer: Medicare HMO | Admitting: Family Medicine

## 2020-04-06 ENCOUNTER — Other Ambulatory Visit: Payer: Self-pay

## 2020-04-06 MED FILL — ENTRESTO 24 MG-26 MG TABLET: 24-26 | 30 days supply | Qty: 60 | Fill #0

## 2020-04-07 ENCOUNTER — Encounter: Payer: Self-pay | Admitting: Podiatry

## 2020-04-07 NOTE — Progress Notes (Signed)
Subjective:  Patient ID: Vincent Chandler, male    DOB: [birth year],  MRN: [id number]  Chief Complaint  Patient presents with  . Wound Check    f/u left foot wound check. pt states wound is still present and a new wound has occured sinc his last visit.     59 y.o. male presents for wound care.  Patient presents with a follow-up of left hallux lateral wound that seems to be doing a lot better and improving.  However another new wound has appeared on 03/25/2020 on the medial side of the great toe.  This is likely due to bandage rubbing leading to abrasion/friction blister.  Patient states that overall he is doing a lot better.  He was doing Betadine wet-to-dry dressing changes.  He denies any other acute complaints.  He is a diabetic with last A1c of 6.9.   Review of Systems: Negative except as noted in the HPI. Denies N/V/F/Ch.  Past Medical History:  Diagnosis Date  . AICD (automatic cardioverter/defibrillator) present   . Anginal pain ([place])   . Anxiety   . Asthma   . Atrial fibrillation-postoperative 11/28/2012  . Cardiomyopathy    alcohol use related  . CHF (congestive heart failure) ([place])   . Chronic back pain   . Coronary artery disease    drug eluting stent RCA 2005-EF 30%- s/p CABG x 4; 2/4 patent grafts with SVG-PLOM and SVG-RCA system totally occluded. There are collaterals from the LAD system to the RCA and the RCA is totally occluded proximally. There are no collaterals to the LCx territory. He then underwent successful PCI of the mid left circumflex artery with overlapping Synergy drug-eluting stents  . Depression    pt denies  . Diabetes mellitus type II dx'd in the 1990's  . GERD (gastroesophageal reflux disease)    yrs ago  . H/O hiatal hernia   . Hemorrhoids   . History of hypogonadism   . History of kidney stones   . Hyperlipidemia   . Hypertension   . OSA on CPAP    "mask is broken; working on getting a new one" (01/15/2015; 10/03/2017)  . Pneumonia    "3-4  times" (10/03/2017)  . Urinary incontinence     Current Outpatient Medications:  .  ACCU-CHEK FASTCLIX LANCETS MISC, Use as directed once a day.  Dx code: E11.9, Disp: 100 each, Rfl: 1 .  albuterol (ACCUNEB) 0.63 MG/3ML nebulizer solution, Take 3 mLs (0.63 mg total) by nebulization every 6 (six) hours as needed for wheezing., Disp: 75 mL, Rfl: 12 .  albuterol (VENTOLIN HFA) 108 (90 Base) MCG/ACT inhaler, Inhale 2 puffs into the lungs every 6 (six) hours as needed for wheezing or shortness of breath. , Disp: , Rfl:  .  amoxicillin-clavulanate (AUGMENTIN) 875-125 MG tablet, Take 1 tablet by mouth 2 (two) times daily., Disp: 28 tablet, Rfl: 0 .  atorvastatin (LIPITOR) 40 MG tablet, TAKE 1 TABLET BY MOUTH AT BEDTIME., Disp: 30 tablet, Rfl: 3 .  blood glucose meter kit and supplies, Dispense based on patient and insurance preference. Use as directed once a day. Dx Code E11.9., Disp: 1 each, Rfl: 0 .  Blood Glucose Monitoring Suppl (ACCU-CHEK GUIDE) w/Device KIT, 1 each by Does not apply route daily. DX Code: E11.9, Disp: 1 kit, Rfl: 0 .  carvedilol (COREG) 6.25 MG tablet, Take 1 tablet (6.25 mg total) by mouth 2 (two) times daily with a meal., Disp: 60 tablet, Rfl: 6 .  digoxin (LANOXIN) 0.125 MG tablet,  TAKE 1/2 TABLET BY MOUTH DAILY., Disp: 45 tablet, Rfl: 3 .  doxycycline (VIBRA-TABS) 100 MG tablet, Take 1 tablet (100 mg total) by mouth 2 (two) times daily., Disp: 28 tablet, Rfl: 0 .  ELIQUIS 5 MG TABS tablet, TAKE 1 TABLET BY MOUTH 2 TIMES DAILY., Disp: 180 tablet, Rfl: 3 .  furosemide (LASIX) 20 MG tablet, Take 2 tablets (40 mg total) by mouth daily., Disp: 60 tablet, Rfl: 5 .  glucose blood (ACCU-CHEK GUIDE) test strip, Use as directed once a day.  Dx code: E11.9, Disp: 100 each, Rfl: 1 .  guaiFENesin (MUCINEX) 600 MG 12 hr tablet, Take 1 tablet (600 mg total) by mouth 2 (two) times daily as needed for to loosen phlegm., Disp: , Rfl:  .  icosapent Ethyl (VASCEPA) 1 g capsule, Take 2 capsules (2 g  total) by mouth 2 (two) times daily., Disp: 120 capsule, Rfl: 6 .  metFORMIN (GLUCOPHAGE) 500 MG tablet, Take 1 tablet (500 mg total) by mouth 2 (two) times daily with a meal., Disp: 180 tablet, Rfl: 3 .  mometasone-formoterol (DULERA) 200-5 MCG/ACT AERO, Inhale 2 puffs into the lungs 2 (two) times a day., Disp: 2 Inhaler, Rfl: 0 .  nitroGLYCERIN (NITROSTAT) 0.4 MG SL tablet, Place 1 tablet (0.4 mg total) under the tongue every 5 (five) minutes as needed for chest pain., Disp: 25 tablet, Rfl: 3 .  OFEV 150 MG CAPS, TAKE 1 CAPSULE BY MOUTH TWICE DAILY WITH FOOD. CALL [phone] FOR REFILLS, Disp: 60 capsule, Rfl: 5 .  OXYGEN, Inhale into the lungs. 3L prn , Disp: , Rfl:  .  predniSONE (DELTASONE) 10 MG tablet, TAKE 1 TABLET (10 MG TOTAL) BY MOUTH DAILY WITH BREAKFAST., Disp: 90 tablet, Rfl: 1 .  spironolactone (ALDACTONE) 25 MG tablet, Take 1 tablet (25 mg total) by mouth daily., Disp: 90 tablet, Rfl: 3 .  ENTRESTO 24-26 MG, TAKE 1 TABLET BY MOUTH 2 (TWO) TIMES DAILY., Disp: 60 tablet, Rfl: 11  Social History   Tobacco Use  Smoking Status Never Smoker  Smokeless Tobacco Current User  . Types: Chew    No Known Allergies Objective:  There were no vitals filed for this visit. There is no height or weight on file to calculate BMI. Constitutional Well developed. Well nourished.  Vascular Dorsalis pedis pulses palpable bilaterally. Posterior tibial pulses palpable bilaterally. Capillary refill normal to all digits.  No cyanosis or clubbing noted. Pedal hair growth normal.  Neurologic Normal speech. Oriented to person, place, and time. Protective sensation absent  Dermatologic Wound Location: Left hallux lateral aspect wound with fat layer exposed Wound Base: Mixed Granular/Fibrotic Peri-wound: Reddened Exudate: Scant/small amount Serosanguinous exudate Wound Measurements: -See below  Orthopedic: No pain to palpation either foot.   Radiographs: 3 views of skeletally mature the left  foot: No signs of osteomyelitis noted.  No cortical destruction noted.  No soft tissue emphysema noted. Assessment:   1. Abrasion of left great toe, initial encounter   2. Skin ulcer of toe of left foot, limited to breakdown of skin ([place])   3. Uncontrolled type 2 diabetes mellitus with hyperglycemia ([place])    Plan:  Patient was evaluated and treated and all questions answered.  Ulcer left hallux lateral aspect limited to the breakdown of the skin and now with medial aspect due to abrasion/friction blister -Debridement as below. -Dressed with Betadine wet-to-dry, DSD. -Continue off-loading with surgical shoe. -Patient has completed the course of doxycycline.  Patient no longer needs further antibiotics as it has  improved. -Patient will continue doing Betadine dressing changes to both sites.  I explained the patient not to wrap the bandages too tight as this leading to excessive abrasion/friction and therefore breakdown of the underlying skin.  Patient states understanding  Procedure: Excisional Debridement of Wound Tool: Sharp chisel blade/tissue nipper Rationale: Removal of non-viable soft tissue from the wound to promote healing.  Anesthesia: none Pre-Debridement Wound Measurements: 0.2 cm x 0.2 cm x 0.2 cm Post-Debridement Wound Measurements: 0.3 cm x 0.2 cm x 0.2 cm Type of Debridement: Sharp Excisional Tissue Removed: Non-viable soft tissue Blood loss: Minimal (<50cc) Depth of Debridement: subcutaneous tissue. Technique: Sharp excisional debridement to bleeding, viable wound base.  Wound Progress: Appears to be decreasing based on measurements. Dressing: Dry, sterile, compression dressing. Disposition: Patient tolerated procedure well. Patient to return in 1 week for follow-up.  No follow-ups on file.

## 2020-04-08 DIAGNOSIS — J9611 Chronic respiratory failure with hypoxia: Secondary | ICD-10-CM | POA: Diagnosis not present

## 2020-04-08 DIAGNOSIS — J849 Interstitial pulmonary disease, unspecified: Secondary | ICD-10-CM | POA: Diagnosis not present

## 2020-04-08 DIAGNOSIS — G4733 Obstructive sleep apnea (adult) (pediatric): Secondary | ICD-10-CM | POA: Diagnosis not present

## 2020-04-20 NOTE — Progress Notes (Signed)
Patient returned call and spoke with staff in triage.  Results/recommendations provided to patient.  Nothing further needed.

## 2020-04-21 ENCOUNTER — Other Ambulatory Visit: Payer: Self-pay

## 2020-04-21 ENCOUNTER — Ambulatory Visit: Payer: Medicare HMO | Admitting: Podiatry

## 2020-04-21 DIAGNOSIS — E1165 Type 2 diabetes mellitus with hyperglycemia: Secondary | ICD-10-CM | POA: Diagnosis not present

## 2020-04-21 DIAGNOSIS — L97521 Non-pressure chronic ulcer of other part of left foot limited to breakdown of skin: Secondary | ICD-10-CM

## 2020-04-21 DIAGNOSIS — S90412A Abrasion, left great toe, initial encounter: Secondary | ICD-10-CM

## 2020-04-22 ENCOUNTER — Encounter: Payer: Self-pay | Admitting: Podiatry

## 2020-04-22 NOTE — Progress Notes (Signed)
Subjective:  Patient ID: Vincent Chandler, male    DOB: 1961-04-30,  MRN: [id number]  Chief Complaint  Patient presents with  . Wound Check    PT stated he has no pain or concerns at this time    59 y.o. male presents for wound care.  Patient presents with a follow-up of left hallux lateral wound as well as medial wound.  Patient states overall is doing much better.  He denies any other acute complaints.  He has been doing Betadine wet-to-dry dressing changes.  He feels like it is healing.  No pain associated with it.   Review of Systems: Negative except as noted in the HPI. Denies N/V/F/Ch.  Past Medical History:  Diagnosis Date  . AICD (automatic cardioverter/defibrillator) present   . Anginal pain ([place])   . Anxiety   . Asthma   . Atrial fibrillation-postoperative 11/28/2012  . Cardiomyopathy    alcohol use related  . CHF (congestive heart failure) ([place])   . Chronic back pain   . Coronary artery disease    drug eluting stent RCA 2005-EF 30%- s/p CABG x 4; 2/4 patent grafts with SVG-PLOM and SVG-RCA system totally occluded. There are collaterals from the LAD system to the RCA and the RCA is totally occluded proximally. There are no collaterals to the LCx territory. He then underwent successful PCI of the mid left circumflex artery with overlapping Synergy drug-eluting stents  . Depression    pt denies  . Diabetes mellitus type II dx'd in the 1990's  . GERD (gastroesophageal reflux disease)    yrs ago  . H/O hiatal hernia   . Hemorrhoids   . History of hypogonadism   . History of kidney stones   . Hyperlipidemia   . Hypertension   . OSA on CPAP    "mask is broken; working on getting a new one" (01/15/2015; 10/03/2017)  . Pneumonia    "3-4 times" (10/03/2017)  . Urinary incontinence     Current Outpatient Medications:  .  ACCU-CHEK FASTCLIX LANCETS MISC, Use as directed once a day.  Dx code: E11.9, Disp: 100 each, Rfl: 1 .  albuterol (ACCUNEB) 0.63 MG/3ML nebulizer solution,  Take 3 mLs (0.63 mg total) by nebulization every 6 (six) hours as needed for wheezing., Disp: 75 mL, Rfl: 12 .  albuterol (VENTOLIN HFA) 108 (90 Base) MCG/ACT inhaler, Inhale 2 puffs into the lungs every 6 (six) hours as needed for wheezing or shortness of breath. , Disp: , Rfl:  .  amoxicillin-clavulanate (AUGMENTIN) 875-125 MG tablet, Take 1 tablet by mouth 2 (two) times daily., Disp: 28 tablet, Rfl: 0 .  atorvastatin (LIPITOR) 40 MG tablet, TAKE 1 TABLET BY MOUTH AT BEDTIME., Disp: 30 tablet, Rfl: 3 .  blood glucose meter kit and supplies, Dispense based on patient and insurance preference. Use as directed once a day. Dx Code E11.9., Disp: 1 each, Rfl: 0 .  Blood Glucose Monitoring Suppl (ACCU-CHEK GUIDE) w/Device KIT, 1 each by Does not apply route daily. DX Code: E11.9, Disp: 1 kit, Rfl: 0 .  carvedilol (COREG) 6.25 MG tablet, Take 1 tablet (6.25 mg total) by mouth 2 (two) times daily with a meal., Disp: 60 tablet, Rfl: 6 .  digoxin (LANOXIN) 0.125 MG tablet, TAKE 1/2 TABLET BY MOUTH DAILY., Disp: 45 tablet, Rfl: 3 .  doxycycline (VIBRA-TABS) 100 MG tablet, Take 1 tablet (100 mg total) by mouth 2 (two) times daily., Disp: 28 tablet, Rfl: 0 .  ELIQUIS 5 MG TABS tablet, TAKE 1  TABLET BY MOUTH 2 TIMES DAILY., Disp: 180 tablet, Rfl: 3 .  ENTRESTO 24-26 MG, TAKE 1 TABLET BY MOUTH 2 (TWO) TIMES DAILY., Disp: 60 tablet, Rfl: 11 .  furosemide (LASIX) 20 MG tablet, Take 2 tablets (40 mg total) by mouth daily., Disp: 60 tablet, Rfl: 5 .  glucose blood (ACCU-CHEK GUIDE) test strip, Use as directed once a day.  Dx code: E11.9, Disp: 100 each, Rfl: 1 .  guaiFENesin (MUCINEX) 600 MG 12 hr tablet, Take 1 tablet (600 mg total) by mouth 2 (two) times daily as needed for to loosen phlegm., Disp: , Rfl:  .  icosapent Ethyl (VASCEPA) 1 g capsule, Take 2 capsules (2 g total) by mouth 2 (two) times daily., Disp: 120 capsule, Rfl: 6 .  metFORMIN (GLUCOPHAGE) 500 MG tablet, Take 1 tablet (500 mg total) by mouth 2 (two)  times daily with a meal., Disp: 180 tablet, Rfl: 3 .  mometasone-formoterol (DULERA) 200-5 MCG/ACT AERO, Inhale 2 puffs into the lungs 2 (two) times a day., Disp: 2 Inhaler, Rfl: 0 .  nitroGLYCERIN (NITROSTAT) 0.4 MG SL tablet, Place 1 tablet (0.4 mg total) under the tongue every 5 (five) minutes as needed for chest pain., Disp: 25 tablet, Rfl: 3 .  OFEV 150 MG CAPS, TAKE 1 CAPSULE BY MOUTH TWICE DAILY WITH FOOD. CALL [phone] FOR REFILLS, Disp: 60 capsule, Rfl: 5 .  OXYGEN, Inhale into the lungs. 3L prn , Disp: , Rfl:  .  predniSONE (DELTASONE) 10 MG tablet, TAKE 1 TABLET (10 MG TOTAL) BY MOUTH DAILY WITH BREAKFAST., Disp: 90 tablet, Rfl: 1 .  spironolactone (ALDACTONE) 25 MG tablet, Take 1 tablet (25 mg total) by mouth daily., Disp: 90 tablet, Rfl: 3  Social History   Tobacco Use  Smoking Status Never Smoker  Smokeless Tobacco Current User  . Types: Chew    No Known Allergies Objective:  There were no vitals filed for this visit. There is no height or weight on file to calculate BMI. Constitutional Well developed. Well nourished.  Vascular Dorsalis pedis pulses palpable bilaterally. Posterior tibial pulses palpable bilaterally. Capillary refill normal to all digits.  No cyanosis or clubbing noted. Pedal hair growth normal.  Neurologic Normal speech. Oriented to person, place, and time. Protective sensation absent  Dermatologic Wound Location: Left hallux lateral aspect wound with fat layer exposed Wound Base: Mixed Granular/Fibrotic Peri-wound: Reddened Exudate: Scant/small amount Serosanguinous exudate Wound Measurements: -See below  Orthopedic: No pain to palpation either foot.   Radiographs: 3 views of skeletally mature the left foot: No signs of osteomyelitis noted.  No cortical destruction noted.  No soft tissue emphysema noted. Assessment:   1. Abrasion of left great toe, initial encounter   2. Skin ulcer of toe of left foot, limited to breakdown of skin ([place])     3. Uncontrolled type 2 diabetes mellitus with hyperglycemia ([place])    Plan:  Patient was evaluated and treated and all questions answered.  Ulcer left hallux lateral aspect limited to the breakdown of the skin and now with medial aspect due to abrasion/friction blister -Debridement as below. -Dressed with triple antibiotic and a Band-Aid -Continue off-loading with surgical shoe. -Patient has completed the course of doxycycline.  Patient no longer needs further antibiotics as it has improved. -Patient will continue doing Betadine dressing changes to both sites.  I explained the patient not to wrap the bandages too tight as this leading to excessive abrasion/friction and therefore breakdown of the underlying skin.  Patient states understanding  Procedure: Excisional Debridement of Wound Tool: Sharp chisel blade/tissue nipper Rationale: Removal of non-viable soft tissue from the wound to promote healing.  Anesthesia: none Pre-Debridement Wound Measurements: 21 cm x 0.1 cm x 1.1 cm Post-Debridement Wound Measurements: 0.2 cm x 1.1 cm x 0.1 cm Type of Debridement: Sharp Excisional Tissue Removed: Non-viable soft tissue Blood loss: Minimal (<50cc) Depth of Debridement: subcutaneous tissue. Technique: Sharp excisional debridement to bleeding, viable wound base.  Wound Progress: The wound has regressed completely.  Only a small amount left.. Dressing: Dry, sterile, compression dressing. Disposition: Patient tolerated procedure well. Patient to return in 1 week for follow-up.  No follow-ups on file.

## 2020-04-27 ENCOUNTER — Encounter: Payer: Medicare HMO | Admitting: *Deleted

## 2020-04-27 ENCOUNTER — Encounter (HOSPITAL_COMMUNITY): Payer: Self-pay | Admitting: Cardiology

## 2020-04-27 ENCOUNTER — Telehealth (HOSPITAL_COMMUNITY): Payer: Self-pay

## 2020-04-27 ENCOUNTER — Other Ambulatory Visit (HOSPITAL_COMMUNITY): Payer: Self-pay | Admitting: Cardiology

## 2020-04-27 ENCOUNTER — Ambulatory Visit (HOSPITAL_COMMUNITY)
Admission: RE | Admit: 2020-04-27 | Discharge: 2020-04-27 | Disposition: A | Discharge status: Home / Self Care | Payer: Medicare HMO | Source: Ambulatory Visit | Attending: Cardiology | Admitting: Cardiology

## 2020-04-27 ENCOUNTER — Other Ambulatory Visit: Payer: Self-pay

## 2020-04-27 VITALS — BP 100/64 | HR 80 | Wt 164.0 lb

## 2020-04-27 VITALS — BP 100/64 | HR 80 | Wt 164.4 lb

## 2020-04-27 DIAGNOSIS — I4892 Unspecified atrial flutter: Secondary | ICD-10-CM | POA: Diagnosis not present

## 2020-04-27 DIAGNOSIS — I5022 Chronic systolic (congestive) heart failure: Secondary | ICD-10-CM | POA: Diagnosis not present

## 2020-04-27 DIAGNOSIS — Z7901 Long term (current) use of anticoagulants: Secondary | ICD-10-CM | POA: Insufficient documentation

## 2020-04-27 DIAGNOSIS — E1122 Type 2 diabetes mellitus with diabetic chronic kidney disease: Secondary | ICD-10-CM | POA: Diagnosis not present

## 2020-04-27 DIAGNOSIS — J8410 Pulmonary fibrosis, unspecified: Secondary | ICD-10-CM | POA: Diagnosis not present

## 2020-04-27 DIAGNOSIS — I272 Pulmonary hypertension, unspecified: Secondary | ICD-10-CM | POA: Insufficient documentation

## 2020-04-27 DIAGNOSIS — Z7984 Long term (current) use of oral hypoglycemic drugs: Secondary | ICD-10-CM | POA: Diagnosis not present

## 2020-04-27 DIAGNOSIS — Z951 Presence of aortocoronary bypass graft: Secondary | ICD-10-CM | POA: Insufficient documentation

## 2020-04-27 DIAGNOSIS — Z79899 Other long term (current) drug therapy: Secondary | ICD-10-CM | POA: Insufficient documentation

## 2020-04-27 DIAGNOSIS — I480 Paroxysmal atrial fibrillation: Secondary | ICD-10-CM | POA: Insufficient documentation

## 2020-04-27 DIAGNOSIS — J849 Interstitial pulmonary disease, unspecified: Secondary | ICD-10-CM | POA: Diagnosis not present

## 2020-04-27 DIAGNOSIS — E785 Hyperlipidemia, unspecified: Secondary | ICD-10-CM | POA: Diagnosis not present

## 2020-04-27 DIAGNOSIS — I2510 Atherosclerotic heart disease of native coronary artery without angina pectoris: Secondary | ICD-10-CM | POA: Diagnosis not present

## 2020-04-27 DIAGNOSIS — N183 Chronic kidney disease, stage 3 unspecified: Secondary | ICD-10-CM | POA: Insufficient documentation

## 2020-04-27 DIAGNOSIS — G4733 Obstructive sleep apnea (adult) (pediatric): Secondary | ICD-10-CM | POA: Insufficient documentation

## 2020-04-27 DIAGNOSIS — Z006 Encounter for examination for normal comparison and control in clinical research program: Secondary | ICD-10-CM

## 2020-04-27 LAB — BASIC METABOLIC PANEL
Anion gap: 12 (ref 5–15)
BUN: 49 mg/dL — ABNORMAL HIGH (ref 6–20)
CO2: 21 mmol/L — ABNORMAL LOW (ref 22–32)
Calcium: 9.3 mg/dL (ref 8.9–10.3)
Chloride: 104 mmol/L (ref 98–111)
Creatinine, Ser: 2.11 mg/dL — ABNORMAL HIGH (ref 0.61–1.24)
GFR, Estimated: 36 mL/min — ABNORMAL LOW (ref >60)
Glucose, Bld: 144 mg/dL — ABNORMAL HIGH (ref 70–99)
Potassium: 5.3 mmol/L — ABNORMAL HIGH (ref 3.5–5.1)
Sodium: 137 mmol/L (ref 135–145)

## 2020-04-27 LAB — LIPID PANEL
Cholesterol: 132 mg/dL (ref 0–200)
HDL: 62 mg/dL (ref >40)
LDL Cholesterol: 43 mg/dL (ref 0–99)
Total CHOL/HDL Ratio: 2.1 RATIO
Triglycerides: 133 mg/dL (ref <150)
VLDL: 27 mg/dL (ref 0–40)

## 2020-04-27 LAB — BRAIN NATRIURETIC PEPTIDE: B Natriuretic Peptide: 116.6 pg/mL — ABNORMAL HIGH (ref 0.0–100.0)

## 2020-04-27 MED ORDER — PREDNISONE 10 MG PO TABS
10.0000 mg | ORAL_TABLET | Freq: Every day | ORAL | 1 refills | Status: DC
Start: 2020-04-27 — End: 2020-06-29

## 2020-04-27 MED ORDER — FUROSEMIDE 20 MG PO TABS
20.0000 mg | ORAL_TABLET | Freq: Every day | ORAL | 6 refills | Status: DC
Start: 2020-04-27 — End: 2020-04-27

## 2020-04-27 MED ORDER — FUROSEMIDE 40 MG PO TABS
40.0000 mg | ORAL_TABLET | Freq: Every day | ORAL | 6 refills | Status: DC
Start: 2020-04-27 — End: 2020-07-29

## 2020-04-27 MED FILL — ELIQUIS 5 MG TABLET: 5 | 90 days supply | Qty: 180 | Fill #3

## 2020-04-27 MED FILL — FUROSEMIDE 40 MG TAB: 40 | 30 days supply | Qty: 30 | Fill #0

## 2020-04-27 MED FILL — predniSONE 10 MG TABS: 10 | 30 days supply | Qty: 30 | Fill #0

## 2020-04-27 MED FILL — SPIRONOLACTONE 25 MG TABS: 25 | 90 days supply | Qty: 90 | Fill #2

## 2020-04-27 NOTE — Telephone Encounter (Signed)
-----  Message from Larey Dresser, MD sent at 04/27/2020  4:12 PM EDT ----- Low K diet, stop any K supplement.  Repeat BMET Friday.

## 2020-04-27 NOTE — Patient Instructions (Signed)
Labs done today, your results will be available in MyChart, we will contact you for abnormal readings.  You have been referred to follow up with Nephrology, they will call you for an appointment  Your physician recommends that you schedule a follow-up appointment in: 3 months  If you have any questions or concerns before your next appointment please send [place] a message through [facility] or call our office at [phone].    TO LEAVE A MESSAGE FOR THE NURSE SELECT OPTION 2, PLEASE LEAVE A MESSAGE INCLUDING: . YOUR NAME . DATE OF BIRTH . CALL BACK NUMBER . REASON FOR CALL**this is important as we prioritize the call backs  [place] AS LONG AS YOU CALL BEFORE 4:00 PM  At the [facility], you and your health needs are our priority. As part of our continuing mission to provide you with exceptional heart care, we have created designated Provider Care Teams. These Care Teams include your primary Cardiologist (physician) and Advanced Practice Providers (APPs- Physician Assistants and Nurse Practitioners) who all work together to provide you with the care you need, when you need it.   You may see any of the following providers on your designated Care Team at your next follow up: Marland Kitchen Dr Glori Bickers . Dr Loralie Champagne . Darrick Grinder, NP . Lyda Jester, PA . Audry Riles, PharmD   Please be sure to bring in all your medications bottles to every appointment.

## 2020-04-27 NOTE — Telephone Encounter (Signed)
Malena Edman, RN  04/27/2020 5:01 PM EDT Back to Top    Patient advised and verbalized understanding. Lab appt scheduled and lab orders placed

## 2020-04-27 NOTE — Progress Notes (Signed)
Date:  04/27/2020   ID:  Vincent Chandler, DOB 05/05/1961, MRN [id number]   Provider location: [facility] Type of Visit: Established patient  PCP:  Ann Held, DO  Cardiologist: Dr. [name]   History of Present Illness: Vincent Chandler is a 59 y.o. male who has a history of A flutter, chronic systolic heart failure, CAD s/p CABG, CKD Stage III, DMII, OSA .  Admitted 08/07/17 with atrial flutter/RVR and acute on chronic systolic heart failure. Underwent successful DC/CV on 2/8. Required short term milrinone but was able to wean off. HF meds started. Echo this admission with EF 15%. D/C weight 201 pounds.  CPX in 2/19 showed severe functional impairment due to heart failure. However, PFTs were restrictive and high resolution CT chest was concerning for interstitial lung disease.   He has had atrial flutter ablation.   He had RHC in 4/19 showing preserved cardiac output and low filling pressures, but moderate pulmonary hypertension.   He saw pulmonary regarding interstitial lung disease and had bronchoscopy.  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  He has been started on prednisone, cough is much improved on prednisone.    He saw Dr. Caryl Comes, not planning to upgrade ICD to CRT given IVCD but not LBBB-like.   He had a Barostim device placed in 1/20.   I tried him on [place] but he developed orthostasis after starting it and had to stop.   He has been on prednisone for suspected amiodarone pulmonary toxicity/pulmonary fibrosis.  He has had a chronic cough.   With worsened dyspnea, I took him for RHC in 6/20.  This showed normal filling pressures and low but not markedly low cardiac output. High resolution CT chest in 6/20 showed interstitial lung disease in UIP patttern. He saw pulmonary and prednisone was increased then tapered back down.   Echo in 3/21 showed EF 20-25%, mild RV dilation with moderately decreased systolic  function.   He returns today for followup of CHF and CAD.  Using CPAP at night and oxygen most of the time during the day.  Weight is down 6 lbs.  No chest pain.  He breathing "comes and goes."  He chronically gets short of breath after walking 50 feet, but does better when he has his oxygen on. Short of breath with stairs.  Chronically elevates head of bed.  No lightheadedness or syncope.    Medtronic device interrogation: Fluid index > threshold but impedance starting to increase again  Labs (2/19): free T4 increased but free T3 normal, LDL 86, HDL 30 Labs (3/19): K 4.9, creatinine 1.53, hgb 14.3, LDL 52, ANA negative, RF negative Labs (4/19): K 4.2, creatinine 1.27 Labs (5/19): LDL 69, K 4.4, creatinine 1.36 Labs (8/19): K 5.3, creatinine 2.2 Labs (10/19): K 5, creatinine 1.49 Labs (12/19): hgb 13.4 Labs (2/20): K 4.2, creatinine 1.45, hgb 14.3 Labs (5/20): K 4.2, creatinine 1.81, digoxin 0.6, LDL 16, TGs 396, hgb 13.8 Labs (6/20): creatinine 2 Labs (8/20): LDL 27, Tgs 284, LFTs normal, K 4.5, creatinine 2.3.  Labs (10/20): K 4.4, creatinine 1.79, hgb 13.4 Labs (12/20): LDL 68, TGs 255 Labs (3/21): K 4.3, creatinine 2.19 Labs (6/21): K 4.4, creatinine 1.87 Labs (7/21): K 4.5, creatinine 2.2, digoxin 0.7  PMH:  1. Atrial fibrillation: Noted post-op CABG.  2. Atrial flutter: Noted during 2/19 admission, DCCV done.  S/p Ablation 3/19.  3. CAD: s/p CABG.  - LHC (1/16):  Patent LIMA-LAD and SVG-D. 80% mLCx and 99% PLOM, total occlusion of SVG-PLOM.  Total occlusion of RCA with occlusion of SVG-RCA.  Patient had DES to mLCx-PLOM.  4. HTN 5. Type II diabetes  6. Hyperlipidemia 7. Chronic systolic CHF: Ischemic cardiomyopathy.   - Echo (2/19): EF 15%, mildly dilated LV, mild LVH, inferolateral akinesis, milr MR, mildly dilated RV with severely reduced systolic function.  - CPX (2/19): Peak VO2 12.2, VE/VCO2 slope 53, RER 1.07 => severe limitation from heart failure.  - Medtronic ICD  -  RHC (4/19): mean RA 5, PA 54/18 mean 31, mean PCWP 12, CI 2.5, PVR 3.6 WU - Echo (1/20): EF 20% - Barostimulator device placed in 1/20.  - RHC (6/20): mean RA 6, PA 42/17 mean 27, mean PCWP 12, CI 2.29, PVR 3.1  - Echo (3/21): EF 20-25%, mild RV dilation with moderately decreased systolic function 8. Interstitial lung disease: PFTs (2/19) were restrictive, raising concern for ILD.  - High resolution CT chest in 2/19: interstitial lung disease concerning for usual interstitial pneumonitis. - Bronchoscopy was done, possible amiodarone lung toxicity causing ILD, now on prednisone.  - High resolution chest CT (6/20): ILD in UIP pattern.   - Ofev 9. CKD stage 3 10. OSA: Home sleep study in 2020 with severe OSA.  11. ABIs (2/19): not significantly abnormal - ABIs (8/21): Normal 12. Carotid dopplers (2/19): Mild disease only.  - Carotid dopplers (1/20): Minimal stenosis.  13. Pulmonary hypertension: ?Group 3 due to ILD.  14. Nephrolithiasis   Current Outpatient Medications  Medication Sig Dispense Refill  . ACCU-CHEK FASTCLIX LANCETS MISC Use as directed once a day.  Dx code: E11.9 100 each 1  . albuterol (ACCUNEB) 0.63 MG/3ML nebulizer solution Take 3 mLs (0.63 mg total) by nebulization every 6 (six) hours as needed for wheezing. 75 mL 12  . albuterol (VENTOLIN HFA) 108 (90 Base) MCG/ACT inhaler Inhale 2 puffs into the lungs every 6 (six) hours as needed for wheezing or shortness of breath.     Marland Kitchen atorvastatin (LIPITOR) 40 MG tablet TAKE 1 TABLET BY MOUTH AT BEDTIME. 30 tablet 3  . blood glucose meter kit and supplies Dispense based on patient and insurance preference. Use as directed once a day. Dx Code E11.9. 1 each 0  . Blood Glucose Monitoring Suppl (ACCU-CHEK GUIDE) w/Device KIT 1 each by Does not apply route daily. DX Code: E11.9 1 kit 0  . carvedilol (COREG) 6.25 MG tablet Take 1 tablet (6.25 mg total) by mouth 2 (two) times daily with a meal. 60 tablet 6  . digoxin (LANOXIN) 0.125 MG  tablet TAKE 1/2 TABLET BY MOUTH DAILY. 45 tablet 3  . ELIQUIS 5 MG TABS tablet TAKE 1 TABLET BY MOUTH 2 TIMES DAILY. 180 tablet 3  . ENTRESTO 24-26 MG TAKE 1 TABLET BY MOUTH 2 (TWO) TIMES DAILY. 60 tablet 11  . furosemide (LASIX) 40 MG tablet Take 1 tablet (40 mg total) by mouth daily. 30 tablet 6  . glucose blood (ACCU-CHEK GUIDE) test strip Use as directed once a day.  Dx code: E11.9 100 each 1  . guaiFENesin (MUCINEX) 600 MG 12 hr tablet Take 1 tablet (600 mg total) by mouth 2 (two) times daily as needed for to loosen phlegm.    Marland Kitchen icosapent Ethyl (VASCEPA) 1 g capsule Take 2 capsules (2 g total) by mouth 2 (two) times daily. 120 capsule 6  . metFORMIN (GLUCOPHAGE) 500 MG tablet Take 1 tablet (500 mg total) by mouth 2 (  two) times daily with a meal. 180 tablet 3  . mometasone-formoterol (DULERA) 200-5 MCG/ACT AERO Inhale 2 puffs into the lungs 2 (two) times a day. 2 Inhaler 0  . Nintedanib (OFEV) 150 MG CAPS Take 150 mg by mouth daily.    . nitroGLYCERIN (NITROSTAT) 0.4 MG SL tablet Place 1 tablet (0.4 mg total) under the tongue every 5 (five) minutes as needed for chest pain. 25 tablet 3  . OXYGEN Inhale into the lungs. 3L prn     . predniSONE (DELTASONE) 10 MG tablet Take 1 tablet (10 mg total) by mouth daily with breakfast. 30 tablet 1  . spironolactone (ALDACTONE) 25 MG tablet Take 1 tablet (25 mg total) by mouth daily. 90 tablet 3   No current facility-administered medications for this encounter.    Allergies:   Patient has no known allergies.   Social History:  The patient  reports that he has never smoked. His smokeless tobacco use includes chew. He reports current alcohol use of about 3.0 standard drinks of alcohol per week. He reports that he does not use drugs.   Family History:  The patient's family history includes Diabetes in his father and mother; Hyperlipidemia in his father; Hypertension in his father and mother.   ROS:  Please see the history of present illness.   All other  systems are personally reviewed and negative.   Exam:    BP 100/64   Pulse 80   Wt 74.6 kg (164 lb 6.4 oz)   SpO2 92%   BMI 24.28 kg/m  General: NAD Neck: No JVD, no thyromegaly or thyroid nodule.  Lungs: Dry crackles at bases.  CV: Nondisplaced PMI.  Heart regular S1/S2, no S3/S4, no murmur.  No peripheral edema.  No carotid bruit.  Normal pedal pulses.  Abdomen: Soft, nontender, no hepatosplenomegaly, no distention.  Skin: Intact without lesions or rashes.  Neurologic: Alert and oriented x 3.  Psych: Normal affect. Extremities: No clubbing or cyanosis.  HEENT: Normal.   Recent Labs: 09/18/2019: Hemoglobin 15.0; Platelets 207 01/27/2020: ALT 13; ALT 13 04/27/2020: B Natriuretic Peptide 116.6; BUN 49; Creatinine, Ser 2.11; Potassium 5.3; Sodium 137  Personally reviewed   Wt Readings from Last 3 Encounters:  04/27/20 74.6 kg (164 lb 6.4 oz)  01/27/20 76.8 kg (169 lb 6.4 oz)  01/26/20 77.4 kg (170 lb 9.6 oz)      ASSESSMENT AND PLAN:  1. Chronic systolic CHF: Ischemic cardiomyopathy. Medtronic ICD single chamber. 2/19 admission for decompensated HF requiring milrinone. Echo 2/7/019 EF 15% Mildly dilated RV. CPX in 2/19 was suggestive of severe limitation due to HF.   RHC in 4/19 showed normal filling pressures and preserved cardiac output.  Echo in 1/20 with EF 20%.  He had a Barostimulator device placed in 1/20.  RHC in 6/20 showed normal filling pressures, CI 2.29.  Echo 3/21 with EF 20-25%.  Chronic NYHA class III symptoms. He does not look volume overloaded on exam today and weight is down, Optivol with fluid index > threshold but impedance trending up. I suspect that a large portion of his dyspnea at this point is due to interstitial lung disease.  - Continue Coreg 6.25 mg bid.   - Continue digoxin 0.0625 mg daily, check level.   - Continue Entresto 24/26 bid.   - Continue spironolactone 25 mg daily.    - Continue Lasix 40 mg daily. BMET/BNP today.  - Has IVCD, not  LBBB-like.  Saw Dr. Caryl Comes, will not upgrade ICD to CRT.  -  With significant ILD requiring oxygen, I do not think that he would be an LVAD candidate.   2. Afib/ Atrial flutter: Paroxysmal.  Admitted in 2/19 with severe HF decompensation in setting of atrial flutter with RVR.  He was cardioverted.  He then had atrial flutter ablation in 3/19.  He is off amiodarone due to question of amiodarone lung toxicity. No palpitations.  NSR today.  - He is now off amiodarone (question of lung toxicity, would not re-challenge in future).   - Continue Eliquis 5 mg bid. Denies bleeding.  3. CAD: S/p CABG.  Last intervention was PCI to LCx in 2016.  No chest pain.   - Continue atorvastatin, check lipids today.  - Triglycerides have been high, continue Vascepa 2 g bid.  - No ASA with Eliquis use.  4. Pulmonary fibrosis: PFTs in 2/19 were restrictive, and high resolution CT showed interstitial lung disease. Amiodarone was stopped due to concern for possible toxicity.  He has seen [name] => bronchoscopy suggestive of ILD related to amiodarone.  He has been on chronic prednisone.  High resolution CT chest repeated in 6/20, again showing ILD, described at UIP pattern. He has started Ofev.  He needs to wear oxygen at all times, it appears.  - Continue Ofev.    - Continue pulmonary followup.  5. OSA: Continue CPAP.    6. CKD: Stage 3.  - Followup with nephrology.  - BMET today.  7. Pulmonary hypertension: Suspect group 3 PH due to lung disease (ILD).  8. DM2: He did not tolerate Farxiga   Followup 3 months.   Signed, Loralie Champagne, MD  04/27/2020  [place] [address] [facility] [place] [address] [phone] (office) [phone] (fax)

## 2020-05-04 DIAGNOSIS — R69 Illness, unspecified: Secondary | ICD-10-CM | POA: Diagnosis not present

## 2020-05-05 ENCOUNTER — Other Ambulatory Visit (HOSPITAL_COMMUNITY): Payer: Medicare HMO

## 2020-05-05 DIAGNOSIS — I2510 Atherosclerotic heart disease of native coronary artery without angina pectoris: Secondary | ICD-10-CM | POA: Diagnosis not present

## 2020-05-05 DIAGNOSIS — I509 Heart failure, unspecified: Secondary | ICD-10-CM | POA: Diagnosis not present

## 2020-05-06 MED FILL — ENTRESTO 24 MG-26 MG TABLET: 24-26 | 30 days supply | Qty: 60 | Fill #1

## 2020-05-09 DIAGNOSIS — J849 Interstitial pulmonary disease, unspecified: Secondary | ICD-10-CM | POA: Diagnosis not present

## 2020-05-09 DIAGNOSIS — G4733 Obstructive sleep apnea (adult) (pediatric): Secondary | ICD-10-CM | POA: Diagnosis not present

## 2020-05-09 DIAGNOSIS — J9611 Chronic respiratory failure with hypoxia: Secondary | ICD-10-CM | POA: Diagnosis not present

## 2020-05-10 ENCOUNTER — Other Ambulatory Visit (HOSPITAL_COMMUNITY): Payer: Self-pay | Admitting: Cardiology

## 2020-05-10 MED FILL — ATORVASTATIN 40 MG TABLET: 40 | 30 days supply | Qty: 30 | Fill #0

## 2020-05-12 DIAGNOSIS — I10 Essential (primary) hypertension: Secondary | ICD-10-CM | POA: Diagnosis not present

## 2020-05-12 DIAGNOSIS — N2581 Secondary hyperparathyroidism of renal origin: Secondary | ICD-10-CM | POA: Diagnosis not present

## 2020-05-12 DIAGNOSIS — I2510 Atherosclerotic heart disease of native coronary artery without angina pectoris: Secondary | ICD-10-CM | POA: Diagnosis not present

## 2020-05-12 DIAGNOSIS — E1122 Type 2 diabetes mellitus with diabetic chronic kidney disease: Secondary | ICD-10-CM | POA: Diagnosis not present

## 2020-05-12 DIAGNOSIS — N1832 Chronic kidney disease, stage 3b: Secondary | ICD-10-CM | POA: Diagnosis not present

## 2020-05-20 ENCOUNTER — Ambulatory Visit: Payer: Medicare HMO | Admitting: Family Medicine

## 2020-05-28 MED FILL — FUROSEMIDE 40 MG TAB: 40 | 30 days supply | Qty: 30 | Fill #1

## 2020-05-28 MED FILL — DIGOXIN 0.125 MG TABLET: 125 | 90 days supply | Qty: 45 | Fill #3

## 2020-05-28 MED FILL — predniSONE 10 MG TABS: 10 | 30 days supply | Qty: 30 | Fill #1

## 2020-06-04 ENCOUNTER — Ambulatory Visit: Payer: Medicare HMO | Admitting: Podiatry

## 2020-06-04 ENCOUNTER — Other Ambulatory Visit: Payer: Self-pay

## 2020-06-04 ENCOUNTER — Encounter: Payer: Self-pay | Admitting: Podiatry

## 2020-06-04 DIAGNOSIS — I2510 Atherosclerotic heart disease of native coronary artery without angina pectoris: Secondary | ICD-10-CM | POA: Diagnosis not present

## 2020-06-04 DIAGNOSIS — L02619 Cutaneous abscess of unspecified foot: Secondary | ICD-10-CM

## 2020-06-04 DIAGNOSIS — I509 Heart failure, unspecified: Secondary | ICD-10-CM | POA: Diagnosis not present

## 2020-06-04 DIAGNOSIS — L03119 Cellulitis of unspecified part of limb: Secondary | ICD-10-CM | POA: Diagnosis not present

## 2020-06-04 DIAGNOSIS — L539 Erythematous condition, unspecified: Secondary | ICD-10-CM | POA: Diagnosis not present

## 2020-06-04 MED ORDER — DOXYCYCLINE HYCLATE 100 MG PO TABS: 100.0000 mg | ORAL_TABLET | Freq: Two times a day (BID) | ORAL | 0 refills | Status: DC

## 2020-06-08 ENCOUNTER — Encounter: Payer: Self-pay | Admitting: Podiatry

## 2020-06-08 DIAGNOSIS — G4733 Obstructive sleep apnea (adult) (pediatric): Secondary | ICD-10-CM | POA: Diagnosis not present

## 2020-06-08 DIAGNOSIS — J9611 Chronic respiratory failure with hypoxia: Secondary | ICD-10-CM | POA: Diagnosis not present

## 2020-06-08 DIAGNOSIS — J849 Interstitial pulmonary disease, unspecified: Secondary | ICD-10-CM | POA: Diagnosis not present

## 2020-06-08 NOTE — Progress Notes (Signed)
Subjective:  Patient ID: Vincent Chandler, male    DOB: [birth year],  MRN: [id number]  Chief Complaint  Patient presents with  . Foot Ulcer    PT STATES HE HAS LEFT FOOT ULCER ON TOP OF FOOT THAT HAS APPEARED 2-2 DAYS AGO. PT STATES ULCER ISNT PAINFUL BUT RED AND SWOLLEN.     59 y.o. male presents for wound care.  Patient presents with a follow-up of left hallux lateral wound as well as medial wound.  He states has completely healed.  He denies any other acute complaints.  He now has a new concern on the left lateral portion of the ankle without possible infection/abscess formation.  He states this has been going for a little bit of time.  The shoe may have rubbed against it as well.  He does not know how it happened.  He has not tried any treatment.  Is been well for 2 to 3 days.   Review of Systems: Negative except as noted in the HPI. Denies N/V/F/Ch.  Past Medical History:  Diagnosis Date  . AICD (automatic cardioverter/defibrillator) present   . Anginal pain ([place])   . Anxiety   . Asthma   . Atrial fibrillation-postoperative 11/28/2012  . Cardiomyopathy    alcohol use related  . CHF (congestive heart failure) ([place])   . Chronic back pain   . Coronary artery disease    drug eluting stent RCA 2005-EF 30%- s/p CABG x 4; 2/4 patent grafts with SVG-PLOM and SVG-RCA system totally occluded. There are collaterals from the LAD system to the RCA and the RCA is totally occluded proximally. There are no collaterals to the LCx territory. He then underwent successful PCI of the mid left circumflex artery with overlapping Synergy drug-eluting stents  . Depression    pt denies  . Diabetes mellitus type II dx'd in the 1990's  . GERD (gastroesophageal reflux disease)    yrs ago  . H/O hiatal hernia   . Hemorrhoids   . History of hypogonadism   . History of kidney stones   . Hyperlipidemia   . Hypertension   . OSA on CPAP    "mask is broken; working on getting a new one" (01/15/2015; 10/03/2017)   . Pneumonia    "3-4 times" (10/03/2017)  . Urinary incontinence     Current Outpatient Medications:  .  ACCU-CHEK FASTCLIX LANCETS MISC, Use as directed once a day.  Dx code: E11.9, Disp: 100 each, Rfl: 1 .  albuterol (ACCUNEB) 0.63 MG/3ML nebulizer solution, Take 3 mLs (0.63 mg total) by nebulization every 6 (six) hours as needed for wheezing., Disp: 75 mL, Rfl: 12 .  albuterol (VENTOLIN HFA) 108 (90 Base) MCG/ACT inhaler, Inhale 2 puffs into the lungs every 6 (six) hours as needed for wheezing or shortness of breath. , Disp: , Rfl:  .  atorvastatin (LIPITOR) 40 MG tablet, TAKE 1 TABLET BY MOUTH AT BEDTIME., Disp: 30 tablet, Rfl: 3 .  blood glucose meter kit and supplies, Dispense based on patient and insurance preference. Use as directed once a day. Dx Code E11.9., Disp: 1 each, Rfl: 0 .  Blood Glucose Monitoring Suppl (ACCU-CHEK GUIDE) w/Device KIT, 1 each by Does not apply route daily. DX Code: E11.9, Disp: 1 kit, Rfl: 0 .  carvedilol (COREG) 6.25 MG tablet, Take 1 tablet (6.25 mg total) by mouth 2 (two) times daily with a meal., Disp: 60 tablet, Rfl: 6 .  digoxin (LANOXIN) 0.125 MG tablet, TAKE 1/2 TABLET BY MOUTH DAILY., Disp:  45 tablet, Rfl: 3 .  doxycycline (VIBRA-TABS) 100 MG tablet, Take 1 tablet (100 mg total) by mouth 2 (two) times daily., Disp: 28 tablet, Rfl: 0 .  ELIQUIS 5 MG TABS tablet, TAKE 1 TABLET BY MOUTH 2 TIMES DAILY., Disp: 180 tablet, Rfl: 3 .  ENTRESTO 24-26 MG, TAKE 1 TABLET BY MOUTH 2 (TWO) TIMES DAILY., Disp: 60 tablet, Rfl: 11 .  furosemide (LASIX) 40 MG tablet, Take 1 tablet (40 mg total) by mouth daily., Disp: 30 tablet, Rfl: 6 .  glucose blood (ACCU-CHEK GUIDE) test strip, Use as directed once a day.  Dx code: E11.9, Disp: 100 each, Rfl: 1 .  guaiFENesin (MUCINEX) 600 MG 12 hr tablet, Take 1 tablet (600 mg total) by mouth 2 (two) times daily as needed for to loosen phlegm., Disp: , Rfl:  .  icosapent Ethyl (VASCEPA) 1 g capsule, Take 2 capsules (2 g total) by  mouth 2 (two) times daily., Disp: 120 capsule, Rfl: 6 .  metFORMIN (GLUCOPHAGE) 500 MG tablet, Take 1 tablet (500 mg total) by mouth 2 (two) times daily with a meal., Disp: 180 tablet, Rfl: 3 .  mometasone-formoterol (DULERA) 200-5 MCG/ACT AERO, Inhale 2 puffs into the lungs 2 (two) times a day., Disp: 2 Inhaler, Rfl: 0 .  Nintedanib (OFEV) 150 MG CAPS, Take 150 mg by mouth daily., Disp: , Rfl:  .  nitroGLYCERIN (NITROSTAT) 0.4 MG SL tablet, Place 1 tablet (0.4 mg total) under the tongue every 5 (five) minutes as needed for chest pain., Disp: 25 tablet, Rfl: 3 .  OXYGEN, Inhale into the lungs. 3L prn , Disp: , Rfl:  .  predniSONE (DELTASONE) 10 MG tablet, Take 1 tablet (10 mg total) by mouth daily with breakfast., Disp: 30 tablet, Rfl: 1 .  spironolactone (ALDACTONE) 25 MG tablet, Take 1 tablet (25 mg total) by mouth daily., Disp: 90 tablet, Rfl: 3  Social History   Tobacco Use  Smoking Status Never Smoker  Smokeless Tobacco Current User  . Types: Chew    No Known Allergies Objective:  There were no vitals filed for this visit. There is no height or weight on file to calculate BMI. Constitutional Well developed. Well nourished.  Vascular Dorsalis pedis pulses palpable bilaterally. Posterior tibial pulses palpable bilaterally. Capillary refill normal to all digits.  No cyanosis or clubbing noted. Pedal hair growth normal.  Neurologic Normal speech. Oriented to person, place, and time. Protective sensation absent  Dermatologic  left lateral hallux ulceration completely epithelialized.  No concern for wound noted at this time.  Left lateral ankle superficial abscess without any fluctuance.  Mild redness/erythema noted.  No area of open wound noted.  No other clinical signs of infection noted.  Orthopedic: No pain to palpation either foot.   Radiographs: 3 views of skeletally mature the left foot: No signs of osteomyelitis noted.  No cortical destruction noted.  No soft tissue emphysema  noted. Assessment:   1. Erythema   2. Cellulitis and abscess of foot, except toes    Plan:  Patient was evaluated and treated and all questions answered.  Left lateral ankle superficial abscess -Patient has a circumferential redness without any concern for area of fluctuance or deep abscess.  Given that patient has a history of opening up ulceration and abscess formation I will place him on continue take you take doxycycline for prophylaxis.  I have asked him that if any redness gets past the area that was demarcated to go to the emergency room right away  for IV antibiotics.  He states understanding. -This could also be likely just irritation/dermatitis however we will continue monitor very closely.  Ulcer left hallux lateral aspect limited to the breakdown of the skin and now with medial aspect due to abrasion/friction blister -Clinically healed and reepithelialized.  No concern for underlying ulceration noted.  No clinical signs of infection noted.  No follow-ups on file.

## 2020-06-09 ENCOUNTER — Ambulatory Visit (INDEPENDENT_AMBULATORY_CARE_PROVIDER_SITE_OTHER): Payer: Medicare HMO | Admitting: Medical

## 2020-06-09 ENCOUNTER — Other Ambulatory Visit: Payer: Self-pay

## 2020-06-09 VITALS — BP 111/64 | HR 95 | Resp 16 | Ht 69.0 in | Wt 167.0 lb

## 2020-06-09 DIAGNOSIS — M25521 Pain in right elbow: Secondary | ICD-10-CM

## 2020-06-09 DIAGNOSIS — E1142 Type 2 diabetes mellitus with diabetic polyneuropathy: Secondary | ICD-10-CM

## 2020-06-09 DIAGNOSIS — M7021 Olecranon bursitis, right elbow: Secondary | ICD-10-CM

## 2020-06-09 LAB — CBC WITH DIFFERENTIAL/PLATELET
Basophils Absolute: 0 10*3/uL (ref 0.0–0.1)
Basophils Relative: 0.3 % (ref 0.0–3.0)
Eosinophils Absolute: 0.2 10*3/uL (ref 0.0–0.7)
Eosinophils Relative: 2.2 % (ref 0.0–5.0)
HCT: 44.3 % (ref 39.0–52.0)
Hemoglobin: 14.4 g/dL (ref 13.0–17.0)
Lymphocytes Relative: 10.5 % — ABNORMAL LOW (ref 12.0–46.0)
Lymphs Abs: 1.1 10*3/uL (ref 0.7–4.0)
MCHC: 32.4 g/dL (ref 30.0–36.0)
MCV: 114.6 fl — ABNORMAL HIGH (ref 78.0–100.0)
Monocytes Absolute: 0.8 10*3/uL (ref 0.1–1.0)
Monocytes Relative: 7.9 % (ref 3.0–12.0)
Neutro Abs: 8.2 10*3/uL — ABNORMAL HIGH (ref 1.4–7.7)
Neutrophils Relative %: 79.1 % — ABNORMAL HIGH (ref 43.0–77.0)
Platelets: 205 10*3/uL (ref 150.0–400.0)
RBC: 3.86 Mil/uL — ABNORMAL LOW (ref 4.22–5.81)
RDW: 17.3 % — ABNORMAL HIGH (ref 11.5–15.5)
WBC: 10.4 10*3/uL (ref 4.0–10.5)

## 2020-06-09 MED ORDER — METHYLPREDNISOLONE 4 MG PO TABS: ORAL_TABLET | ORAL | 0 refills | Status: DC

## 2020-06-09 NOTE — Patient Instructions (Addendum)
You appear to have olecranon bursitis of right elbow.  Will get CBC to make sure infection fighting cells or not elevated.  Consider using NSAID but you are on Eliquis and have decreased kidney function.  So we will prescribe 4-day short taper of Medrol.  Apply ice to area  twice daily.  If you notice red area expanding or swelling worsening and let me know.  In that event would start antibiotic and consider referral to sports medicine or orthopedist to evaluate joint.  History of diabetes and not ideally controlled.  With use of Medrol sugars can increase cell advising strict low sugar diet and continue Metformin.  I want you to check your blood sugars before each meal.  If sugars are over 200 then follow sliding scale provided.  Sliding scale is  201 to 250 4 units of Humalog KwikPen. 251 to 306 units 301 to 358 units 351 to 410 units Greater than 400 and notify us.  But if after hours and symptomatic then go ahead and go to the ED.   Follow-up Monday or as needed.

## 2020-06-09 NOTE — Progress Notes (Signed)
Subjective:    Patient ID: Vincent Chandler, male    DOB: [birth year], 59 y.o.   MRN: [id number]  HPI     Pt has rt elbow are swelling. No trauma or injury. He does not rest his elbow against anything on regular basis. No fever, no chills or sweats.   Pt is rt handed.     Review of Systems  Constitutional: Negative for chills, fatigue and fever.  Respiratory: Negative for chest tightness, shortness of breath and wheezing.   Cardiovascular: Negative for chest pain and palpitations.  Gastrointestinal: Negative for abdominal pain.  Musculoskeletal: Negative for back pain.       Rt elbow pain.  Skin:       Mild redness.  Neurological: Negative for dizziness and headaches.  Hematological: Negative for adenopathy. Does not bruise/bleed easily.       Objective:   Physical Exam  General- No acute distress. Pleasant patient. Neck- Full range of motion, no jvd Lungs- Clear, even and unlabored. Heart- regular rate and rhythm. Neurologic- CNII- XII grossly intact.  Right elbow-mild pinkish-red to tip of elbow.  He has good flexion and extension.  Normal-appearing antecubital fossa.  Tenderness to light palpation over tip of elbow.  Area of pinkish redness is about 2 cm width.         Assessment & Plan:  You appear to have olecranon bursitis of right elbow.  Will get CBC to make sure infection fighting cells or not elevated.  Consider using NSAID but you are on Eliquis and have decreased kidney function.  So we will prescribe 4-day short taper of Medrol.  Apply ice to area  twice daily.  If you notice red area expanding or swelling worsening and let me know.  In that event would start antibiotic and consider referral to sports medicine or orthopedist to evaluate joint.  History of diabetes and not ideally controlled.  With use of Medrol sugars can increase cell advising strict low sugar diet and continue Metformin.  I want you to check your blood sugars before each meal.  If sugars  are over 200 then follow sliding scale provided.  Sliding scale is  201 to 250 4 units of Humalog KwikPen. 251 to 306 units 301 to 358 units 351 to 410 units Greater than 400 and notify us.  But if after hours and symptomatic then go ahead and go to the ED.   Follow-up Monday or as needed.  Mackie Pai, PA-C

## 2020-06-14 ENCOUNTER — Other Ambulatory Visit: Payer: Self-pay

## 2020-06-14 ENCOUNTER — Ambulatory Visit (INDEPENDENT_AMBULATORY_CARE_PROVIDER_SITE_OTHER): Payer: Medicare HMO | Admitting: Family Medicine

## 2020-06-14 ENCOUNTER — Encounter: Payer: Self-pay | Admitting: Family Medicine

## 2020-06-14 VITALS — BP 104/70 | HR 92 | Temp 98.7°F | Resp 18 | Ht 69.0 in | Wt 164.8 lb

## 2020-06-14 DIAGNOSIS — J849 Interstitial pulmonary disease, unspecified: Secondary | ICD-10-CM | POA: Diagnosis not present

## 2020-06-14 DIAGNOSIS — E1122 Type 2 diabetes mellitus with diabetic chronic kidney disease: Secondary | ICD-10-CM | POA: Diagnosis not present

## 2020-06-14 DIAGNOSIS — I10 Essential (primary) hypertension: Secondary | ICD-10-CM | POA: Diagnosis not present

## 2020-06-14 DIAGNOSIS — M7021 Olecranon bursitis, right elbow: Secondary | ICD-10-CM | POA: Insufficient documentation

## 2020-06-14 DIAGNOSIS — N1832 Chronic kidney disease, stage 3b: Secondary | ICD-10-CM

## 2020-06-14 DIAGNOSIS — Z1211 Encounter for screening for malignant neoplasm of colon: Secondary | ICD-10-CM

## 2020-06-14 DIAGNOSIS — E1165 Type 2 diabetes mellitus with hyperglycemia: Secondary | ICD-10-CM

## 2020-06-14 DIAGNOSIS — E785 Hyperlipidemia, unspecified: Secondary | ICD-10-CM

## 2020-06-14 DIAGNOSIS — E1169 Type 2 diabetes mellitus with other specified complication: Secondary | ICD-10-CM

## 2020-06-14 LAB — COMPREHENSIVE METABOLIC PANEL
ALT: 12 U/L (ref 0–53)
AST: 9 U/L (ref 0–37)
Albumin: 3.8 g/dL (ref 3.5–5.2)
Alkaline Phosphatase: 43 U/L (ref 39–117)
BUN: 55 mg/dL — ABNORMAL HIGH (ref 6–23)
CO2: 28 mEq/L (ref 19–32)
Calcium: 9.5 mg/dL (ref 8.4–10.5)
Chloride: 105 mEq/L (ref 96–112)
Creatinine, Ser: 1.55 mg/dL — ABNORMAL HIGH (ref 0.40–1.50)
GFR: 48.91 mL/min — ABNORMAL LOW (ref >60.00)
Glucose, Bld: 228 mg/dL — ABNORMAL HIGH (ref 70–99)
Potassium: 4.6 mEq/L (ref 3.5–5.1)
Sodium: 141 mEq/L (ref 135–145)
Total Bilirubin: 0.5 mg/dL (ref 0.2–1.2)
Total Protein: 6.8 g/dL (ref 6.0–8.3)

## 2020-06-14 LAB — MICROALBUMIN / CREATININE URINE RATIO
Creatinine,U: 113 mg/dL
Microalb Creat Ratio: 1 mg/g (ref 0.0–30.0)
Microalb, Ur: 1.2 mg/dL (ref 0.0–1.9)

## 2020-06-14 LAB — LIPID PANEL
Cholesterol: 145 mg/dL (ref 0–200)
HDL: 55.5 mg/dL (ref >39.00)
LDL Cholesterol: 54 mg/dL (ref 0–99)
NonHDL: 89.26
Total CHOL/HDL Ratio: 3
Triglycerides: 178 mg/dL — ABNORMAL HIGH (ref 0.0–149.0)
VLDL: 35.6 mg/dL (ref 0.0–40.0)

## 2020-06-14 LAB — HEMOGLOBIN A1C: Hgb A1c MFr Bld: 6.8 % — ABNORMAL HIGH (ref 4.6–6.5)

## 2020-06-14 MED FILL — ATORVASTATIN 40 MG TABLET: 40 | 30 days supply | Qty: 30 | Fill #1

## 2020-06-14 MED FILL — ENTRESTO 24 MG-26 MG TABLET: 24-26 | 30 days supply | Qty: 60 | Fill #2

## 2020-06-14 NOTE — Assessment & Plan Note (Signed)
Per pulmonary

## 2020-06-14 NOTE — Progress Notes (Signed)
Patient ID: Vincent Chandler, male    DOB: 11/10/1960  Age: 59 y.o. MRN: [id number]    Subjective:  Subjective  HPI Vincent Chandler presents for f/u dm..  His sugars are running high.  He is on O2 at home now and sees pulmonary regularly  He is also still c/o R elbow pain--- he finished pred taper and elbow is still hurting---  No new injury  Review of Systems  Constitutional: Negative for appetite change, diaphoresis, fatigue and unexpected weight change.  Eyes: Negative for pain, redness and visual disturbance.  Respiratory: Negative for cough, chest tightness, shortness of breath and wheezing.   Cardiovascular: Negative for chest pain, palpitations and leg swelling.  Endocrine: Negative for cold intolerance, heat intolerance, polydipsia, polyphagia and polyuria.  Genitourinary: Negative for difficulty urinating, dysuria and frequency.  Neurological: Negative for dizziness, light-headedness, numbness and headaches.    History Past Medical History:  Diagnosis Date  . AICD (automatic cardioverter/defibrillator) present   . Anginal pain ([place])   . Anxiety   . Asthma   . Atrial fibrillation-postoperative 11/28/2012  . Cardiomyopathy    alcohol use related  . CHF (congestive heart failure) ([place])   . Chronic back pain   . Coronary artery disease    drug eluting stent RCA 2005-EF 30%- s/p CABG x 4; 2/4 patent grafts with SVG-PLOM and SVG-RCA system totally occluded. There are collaterals from the LAD system to the RCA and the RCA is totally occluded proximally. There are no collaterals to the LCx territory. He then underwent successful PCI of the mid left circumflex artery with overlapping Synergy drug-eluting stents  . Depression    pt denies  . Diabetes mellitus type II dx'd in the 1990's  . GERD (gastroesophageal reflux disease)    yrs ago  . H/O hiatal hernia   . Hemorrhoids   . History of hypogonadism   . History of kidney stones   . Hyperlipidemia   . Hypertension   . OSA on  CPAP    "mask is broken; working on getting a new one" (01/15/2015; 10/03/2017)  . Pneumonia    "3-4 times" (10/03/2017)  . Urinary incontinence     He has a past surgical history that includes Tonsillectomy (~ 1970); Coronary angioplasty with stent (~ 2003); Refractive surgery (Bilateral, 1990's); Coronary artery bypass graft (N/A, 08/15/2012); left heart catheterization with coronary angiogram (N/A, 08/08/2012); left heart catheterization with coronary/graft angiogram (N/A, 07/21/2014); percutaneous coronary stent intervention (pci-s) (07/21/2014); Cardiac defibrillator placement (01/15/2015); Cardiac catheterization (N/A, 01/15/2015); TEE without cardioversion (N/A, 08/10/2017); Cardioversion (N/A, 08/10/2017); A-FLUTTER ABLATION (N/A, 09/12/2017); RIGHT HEART CATH (N/A, 10/03/2017); Multiple extractions with alveoloplasty (N/A, 10/04/2017); Video bronchoscopy (Bilateral, 11/28/2017); and RIGHT HEART CATH (N/A, 12/11/2018).   His family history includes Diabetes in his father and mother; Hyperlipidemia in his father; Hypertension in his father and mother.He reports that he has never smoked. His smokeless tobacco use includes chew. He reports current alcohol use of about 3.0 standard drinks of alcohol per week. He reports that he does not use drugs.  Current Outpatient Medications on File Prior to Visit  Medication Sig Dispense Refill  . ACCU-CHEK FASTCLIX LANCETS MISC Use as directed once a day.  Dx code: E11.9 100 each 1  . albuterol (ACCUNEB) 0.63 MG/3ML nebulizer solution Take 3 mLs (0.63 mg total) by nebulization every 6 (six) hours as needed for wheezing. 75 mL 12  . albuterol (VENTOLIN HFA) 108 (90 Base) MCG/ACT inhaler Inhale 2 puffs into the lungs every 6 (six)  hours as needed for wheezing or shortness of breath.     . atorvastatin (LIPITOR) 40 MG tablet TAKE 1 TABLET BY MOUTH AT BEDTIME. 30 tablet 3  . blood glucose meter kit and supplies Dispense based on patient and insurance preference. Use as directed once  a day. Dx Code E11.9. 1 each 0  . Blood Glucose Monitoring Suppl (ACCU-CHEK GUIDE) w/Device KIT 1 each by Does not apply route daily. DX Code: E11.9 1 kit 0  . carvedilol (COREG) 6.25 MG tablet Take 1 tablet (6.25 mg total) by mouth 2 (two) times daily with a meal. 60 tablet 6  . digoxin (LANOXIN) 0.125 MG tablet TAKE 1/2 TABLET BY MOUTH DAILY. 45 tablet 3  . doxycycline (VIBRA-TABS) 100 MG tablet Take 1 tablet (100 mg total) by mouth 2 (two) times daily. 28 tablet 0  . ELIQUIS 5 MG TABS tablet TAKE 1 TABLET BY MOUTH 2 TIMES DAILY. 180 tablet 3  . ENTRESTO 24-26 MG TAKE 1 TABLET BY MOUTH 2 (TWO) TIMES DAILY. 60 tablet 11  . furosemide (LASIX) 40 MG tablet Take 1 tablet (40 mg total) by mouth daily. 30 tablet 6  . glucose blood (ACCU-CHEK GUIDE) test strip Use as directed once a day.  Dx code: E11.9 100 each 1  . guaiFENesin (MUCINEX) 600 MG 12 hr tablet Take 1 tablet (600 mg total) by mouth 2 (two) times daily as needed for to loosen phlegm.    . icosapent Ethyl (VASCEPA) 1 g capsule Take 2 capsules (2 g total) by mouth 2 (two) times daily. 120 capsule 6  . metFORMIN (GLUCOPHAGE) 500 MG tablet Take 1 tablet (500 mg total) by mouth 2 (two) times daily with a meal. 180 tablet 3  . methylPREDNISolone (MEDROL) 4 MG tablet 4 tablet po day 1, 3 tablets po day 2, 2 tablet po. day 3 and 1 tablet p.o. day 4. 16 tablet 0  . mometasone-formoterol (DULERA) 200-5 MCG/ACT AERO Inhale 2 puffs into the lungs 2 (two) times a day. 2 Inhaler 0  . Nintedanib (OFEV) 150 MG CAPS Take 150 mg by mouth daily.    . nitroGLYCERIN (NITROSTAT) 0.4 MG SL tablet Place 1 tablet (0.4 mg total) under the tongue every 5 (five) minutes as needed for chest pain. 25 tablet 3  . OXYGEN Inhale into the lungs. 3L prn     . predniSONE (DELTASONE) 10 MG tablet Take 1 tablet (10 mg total) by mouth daily with breakfast. 30 tablet 1  . spironolactone (ALDACTONE) 25 MG tablet Take 1 tablet (25 mg total) by mouth daily. 90 tablet 3   No  current facility-administered medications on file prior to visit.     Objective:  Objective  Physical Exam Vitals and nursing note reviewed.  Constitutional:      General: He is sleeping. Vital signs are normal.     Appearance: He is well-developed and well-nourished.  HENT:     Head: Normocephalic and atraumatic.     Mouth/Throat:     Mouth: Oropharynx is clear and moist.  Eyes:     Extraocular Movements: EOM normal.     Pupils: Pupils are equal, round, and reactive to light.  Neck:     Thyroid: No thyromegaly.  Cardiovascular:     Rate and Rhythm: Normal rate and regular rhythm.     Heart sounds: No murmur heard.   Pulmonary:     Effort: Pulmonary effort is normal. No respiratory distress.     Breath sounds: Normal breath sounds.   No wheezing or rales.  Chest:     Chest wall: No tenderness.  Musculoskeletal:        General: Swelling and tenderness present. No edema.     Cervical back: Normal range of motion and neck supple.     Comments: R elbow-- swollen and red  Tender to touch , warm   Skin:    General: Skin is warm and dry.  Neurological:     Mental Status: He is oriented to person, place, and time.  Psychiatric:        Mood and Affect: Mood and affect normal.        Behavior: Behavior normal.        Thought Content: Thought content normal.        Judgment: Judgment normal.    BP 104/70 (BP Location: Left Arm, Patient Position: Sitting, Cuff Size: Normal)   Pulse 92   Temp 98.7 F (37.1 C) (Oral)   Resp 18   Ht 5' 9" (1.753 m)   Wt 164 lb 12.8 oz (74.8 kg)   SpO2 90%   BMI 24.34 kg/m  Wt Readings from Last 3 Encounters:  06/14/20 164 lb 12.8 oz (74.8 kg)  06/09/20 167 lb (75.8 kg)  04/27/20 164 lb 6.4 oz (74.6 kg)     Lab Results  Component Value Date   WBC 10.4 06/09/2020   HGB 14.4 06/09/2020   HCT 44.3 06/09/2020   PLT 205.0 06/09/2020   GLUCOSE 228 (H) 06/14/2020   CHOL 145 06/14/2020   TRIG 178.0 (H) 06/14/2020   HDL 55.50 06/14/2020    LDLDIRECT 69.0 11/20/2017   LDLCALC 54 06/14/2020   ALT 12 06/14/2020   AST 9 06/14/2020   NA 141 06/14/2020   K 4.6 06/14/2020   CL 105 06/14/2020   CREATININE 1.55 (H) 06/14/2020   BUN 55 (H) 06/14/2020   CO2 28 06/14/2020   TSH 4.560 (H) 09/10/2017   PSA 0.26 09/21/2015   INR 1.4 (H) 12/03/2018   HGBA1C 6.8 (H) 06/14/2020   MICROALBUR 1.2 06/14/2020    No results found.   Assessment & Plan:  Plan  I am having Vincent Chandler maintain his nitroGLYCERIN, guaiFENesin, blood glucose meter kit and supplies, Accu-Chek Guide, glucose blood, Accu-Chek FastClix Lancets, albuterol, mometasone-formoterol, OXYGEN, albuterol, Eliquis, digoxin, metFORMIN, spironolactone, carvedilol, icosapent Ethyl, Entresto, Ofev, predniSONE, furosemide, atorvastatin, doxycycline, and methylPREDNISolone.  No orders of the defined types were placed in this encounter.   Problem List Items Addressed This Visit      Unprioritized   Essential hypertension    Well controlled, no changes to meds. Encouraged heart healthy diet such as the DASH diet and exercise as tolerated.       ILD (interstitial lung disease) (HCC)    Per pulmonary      Type 2 diabetes mellitus with stage 3 chronic kidney disease, without long-term current use of insulin (HCC)    Refer to endo ---- pt request Check labs today       Uncontrolled type 2 diabetes mellitus with hyperglycemia (HCC) - Primary   Relevant Orders   Ambulatory referral to Endocrinology   Lipid panel (Completed)   Hemoglobin A1c (Completed)   Comprehensive metabolic panel (Completed)   Microalbumin / creatinine urine ratio (Completed)    Other Visit Diagnoses    Colon cancer screening       Relevant Orders   Ambulatory referral to Gastroenterology   Hyperlipidemia associated with type 2 diabetes mellitus (HCC)         Relevant Orders   Lipid panel (Completed)   Hemoglobin A1c (Completed)   Comprehensive metabolic panel (Completed)   Microalbumin /  creatinine urine ratio (Completed)   Primary hypertension       Relevant Orders   Lipid panel (Completed)   Hemoglobin A1c (Completed)   Comprehensive metabolic panel (Completed)   Microalbumin / creatinine urine ratio (Completed)   Olecranon bursitis of right elbow       Relevant Orders   Ambulatory referral to Sports Medicine      Follow-up: Return in about 6 months (around 12/13/2020), or if symptoms worsen or fail to improve, for hypertension, hyperlipidemia, diabetes II.   R Lowne Chase, DO     

## 2020-06-14 NOTE — Assessment & Plan Note (Signed)
Refer to endo ---- pt request Check labs today

## 2020-06-14 NOTE — Patient Instructions (Signed)
DASH Eating Plan DASH stands for "Dietary Approaches to Stop Hypertension." The DASH eating plan is a healthy eating plan that has been shown to reduce high blood pressure (hypertension). It may also reduce your risk for type 2 diabetes, heart disease, and stroke. The DASH eating plan may also help with weight loss. What are tips for following this plan?  General guidelines  Avoid eating more than 2,300 mg (milligrams) of salt (sodium) a day. If you have hypertension, you may need to reduce your sodium intake to 1,500 mg a day.  Limit alcohol intake to no more than 1 drink a day for nonpregnant women and 2 drinks a day for men. One drink equals 12 oz of beer, 5 oz of wine, or 1 oz of hard liquor.  Work with your health care provider to maintain a healthy body weight or to lose weight. Ask what an ideal weight is for you.  Get at least 30 minutes of exercise that causes your heart to beat faster (aerobic exercise) most days of the week. Activities may include walking, swimming, or biking.  Work with your health care provider or diet and nutrition specialist (dietitian) to adjust your eating plan to your individual calorie needs. Reading food labels   Check food labels for the amount of sodium per serving. Choose foods with less than 5 percent of the Daily Value of sodium. Generally, foods with less than 300 mg of sodium per serving fit into this eating plan.  To find whole grains, look for the word "whole" as the first word in the ingredient list. Shopping  Buy products labeled as "low-sodium" or "no salt added."  Buy fresh foods. Avoid canned foods and premade or frozen meals. Cooking  Avoid adding salt when cooking. Use salt-free seasonings or herbs instead of table salt or sea salt. Check with your health care provider or pharmacist before using salt substitutes.  Do not fry foods. Cook foods using healthy methods such as baking, boiling, grilling, and broiling instead.  Cook with  heart-healthy oils, such as olive, canola, soybean, or sunflower oil. Meal planning  Eat a balanced diet that includes: ? 5 or more servings of fruits and vegetables each day. At each meal, try to fill half of your plate with fruits and vegetables. ? Up to 6-8 servings of whole grains each day. ? Less than 6 oz of lean meat, poultry, or fish each day. A 3-oz serving of meat is about the same size as a deck of cards. One egg equals 1 oz. ? 2 servings of low-fat dairy each day. ? A serving of nuts, seeds, or beans 5 times each week. ? Heart-healthy fats. Healthy fats called Omega-3 fatty acids are found in foods such as flaxseeds and coldwater fish, like sardines, salmon, and mackerel.  Limit how much you eat of the following: ? Canned or prepackaged foods. ? Food that is high in trans fat, such as fried foods. ? Food that is high in saturated fat, such as fatty meat. ? Sweets, desserts, sugary drinks, and other foods with added sugar. ? Full-fat dairy products.  Do not salt foods before eating.  Try to eat at least 2 vegetarian meals each week.  Eat more home-cooked food and less restaurant, buffet, and fast food.  When eating at a restaurant, ask that your food be prepared with less salt or no salt, if possible. What foods are recommended? The items listed may not be a complete list. Talk with your dietitian about   what dietary choices are best for you. Grains Whole-grain or whole-wheat bread. Whole-grain or whole-wheat pasta. Brown rice. Oatmeal. Quinoa. Bulgur. Whole-grain and low-sodium cereals. Pita bread. Low-fat, low-sodium crackers. Whole-wheat flour tortillas. Vegetables Fresh or frozen vegetables (raw, steamed, roasted, or grilled). Low-sodium or reduced-sodium tomato and vegetable juice. Low-sodium or reduced-sodium tomato sauce and tomato paste. Low-sodium or reduced-sodium canned vegetables. Fruits All fresh, dried, or frozen fruit. Canned fruit in natural juice (without  added sugar). Meat and other protein foods Skinless chicken or turkey. Ground chicken or turkey. Pork with fat trimmed off. Fish and seafood. Egg whites. Dried beans, peas, or lentils. Unsalted nuts, nut butters, and seeds. Unsalted canned beans. Lean cuts of beef with fat trimmed off. Low-sodium, lean deli meat. Dairy Low-fat (1%) or fat-free (skim) milk. Fat-free, low-fat, or reduced-fat cheeses. Nonfat, low-sodium ricotta or cottage cheese. Low-fat or nonfat yogurt. Low-fat, low-sodium cheese. Fats and oils Soft margarine without trans fats. Vegetable oil. Low-fat, reduced-fat, or light mayonnaise and salad dressings (reduced-sodium). Canola, safflower, olive, soybean, and sunflower oils. Avocado. Seasoning and other foods Herbs. Spices. Seasoning mixes without salt. Unsalted popcorn and pretzels. Fat-free sweets. What foods are not recommended? The items listed may not be a complete list. Talk with your dietitian about what dietary choices are best for you. Grains Baked goods made with fat, such as croissants, muffins, or some breads. Dry pasta or rice meal packs. Vegetables Creamed or fried vegetables. Vegetables in a cheese sauce. Regular canned vegetables (not low-sodium or reduced-sodium). Regular canned tomato sauce and paste (not low-sodium or reduced-sodium). Regular tomato and vegetable juice (not low-sodium or reduced-sodium). Pickles. Olives. Fruits Canned fruit in a light or heavy syrup. Fried fruit. Fruit in cream or butter sauce. Meat and other protein foods Fatty cuts of meat. Ribs. Fried meat. Bacon. Sausage. Bologna and other processed lunch meats. Salami. Fatback. Hotdogs. Bratwurst. Salted nuts and seeds. Canned beans with added salt. Canned or smoked fish. Whole eggs or egg yolks. Chicken or turkey with skin. Dairy Whole or 2% milk, cream, and half-and-half. Whole or full-fat cream cheese. Whole-fat or sweetened yogurt. Full-fat cheese. Nondairy creamers. Whipped toppings.  Processed cheese and cheese spreads. Fats and oils Butter. Stick margarine. Lard. Shortening. Ghee. Bacon fat. Tropical oils, such as coconut, palm kernel, or palm oil. Seasoning and other foods Salted popcorn and pretzels. Onion salt, garlic salt, seasoned salt, table salt, and sea salt. Worcestershire sauce. Tartar sauce. Barbecue sauce. Teriyaki sauce. Soy sauce, including reduced-sodium. Steak sauce. Canned and packaged gravies. Fish sauce. Oyster sauce. Cocktail sauce. Horseradish that you find on the shelf. Ketchup. Mustard. Meat flavorings and tenderizers. Bouillon cubes. Hot sauce and Tabasco sauce. Premade or packaged marinades. Premade or packaged taco seasonings. Relishes. Regular salad dressings. Where to find more information:  National Heart, Lung, and Blood Institute: www.nhlbi.nih.gov  American Heart Association: www.heart.org Summary  The DASH eating plan is a healthy eating plan that has been shown to reduce high blood pressure (hypertension). It may also reduce your risk for type 2 diabetes, heart disease, and stroke.  With the DASH eating plan, you should limit salt (sodium) intake to 2,300 mg a day. If you have hypertension, you may need to reduce your sodium intake to 1,500 mg a day.  When on the DASH eating plan, aim to eat more fresh fruits and vegetables, whole grains, lean proteins, low-fat dairy, and heart-healthy fats.  Work with your health care provider or diet and nutrition specialist (dietitian) to adjust your eating plan to your   individual calorie needs. This information is not intended to replace advice given to you by your health care provider. Make sure you discuss any questions you have with your health care provider. Document Revised: 06/01/2017 Document Reviewed: 06/12/2016 Elsevier Patient Education  2020 [employer].

## 2020-06-14 NOTE — Assessment & Plan Note (Signed)
Well controlled, no changes to meds. Encouraged heart healthy diet such as the DASH diet and exercise as tolerated.

## 2020-06-14 NOTE — Assessment & Plan Note (Signed)
Pt is on doxy for something else pred daily for ILD Refer to sport med

## 2020-06-18 ENCOUNTER — Ambulatory Visit: Payer: Medicare HMO | Admitting: Podiatry

## 2020-06-21 ENCOUNTER — Ambulatory Visit: Payer: Medicare HMO | Admitting: Sports Medicine

## 2020-06-23 ENCOUNTER — Ambulatory Visit (INDEPENDENT_AMBULATORY_CARE_PROVIDER_SITE_OTHER): Payer: Medicare HMO | Admitting: Sports Medicine

## 2020-06-23 DIAGNOSIS — M71121 Other infective bursitis, right elbow: Secondary | ICD-10-CM | POA: Diagnosis not present

## 2020-06-23 DIAGNOSIS — M7021 Olecranon bursitis, right elbow: Secondary | ICD-10-CM

## 2020-06-23 MED ORDER — DOXYCYCLINE HYCLATE 100 MG PO TABS: 100.0000 mg | ORAL_TABLET | Freq: Two times a day (BID) | ORAL | 0 refills | Status: DC

## 2020-06-23 MED FILL — FUROSEMIDE 40 MG TAB: 40 | 30 days supply | Qty: 30 | Fill #2

## 2020-06-23 NOTE — Assessment & Plan Note (Signed)
Vincent Chandler is a pleasant 60 year old male, multiple comorbidities including diabetes, he has had some swelling and redness as well as pain in his right elbow for a couple of weeks now, he had some prednisone. This looks like classic septic olecranon bursitis, he we will add doxycycline for 10 days twice daily. He will apply warm compresses rather than cool compresses to help improve delivery of the antibiotic to the area, return to see me in a month. No obvious fluctuance to serve as a target for incision and drainage and irrigation.

## 2020-06-23 NOTE — Progress Notes (Signed)
    Procedures performed today:    None.  Independent interpretation of notes and tests performed by another provider:   None.  Brief History, Exam, Impression, and Recommendations:    Olecranon bursitis of right elbow Vincent Chandler is a pleasant 59 year old male, multiple comorbidities including diabetes, he has had some swelling and redness as well as pain in his right elbow for a couple of weeks now, he had some prednisone. This looks like classic septic olecranon bursitis, he we will add doxycycline for 10 days twice daily. He will apply warm compresses rather than cool compresses to help improve delivery of the antibiotic to the area, return to see me in a month. No obvious fluctuance to serve as a target for incision and drainage and irrigation.    ___________________________________________ Gwen Her. Dianah Field, M.D., ABFM., CAQSM. Primary Care and [place] [employer] [facility] [employer] [employer] [facility]

## 2020-06-23 NOTE — Patient Instructions (Signed)
Elbow Bursitis  Bursitis is swelling and pain at the tip of the elbow. This happens when fluid builds up in a sac under the skin (bursa). This may also be called olecranon bursitis. What are the causes? Elbow bursitis may be caused by:  Elbow injury, such as falling onto the elbow.  Leaning on hard surfaces for long periods of time.  Infection from an injury that breaks the skin near the elbow.  A bone growth (spur) that forms at the tip of the elbow.  A medical condition that causes inflammation, such as gout or rheumatoid arthritis. Sometimes the cause is not known. What are the signs or symptoms? The first sign of elbow bursitis is usually swelling at the tip of the elbow. This can grow to be about the size of a golf ball. Swelling may start suddenly or develop gradually. Other symptoms may include:  Pain when bending or leaning on the elbow.  Not being able to move the elbow normally. If bursitis is caused by an infection, you may have:  Redness, warmth, and tenderness of the elbow.  Drainage of pus from the swollen area over the elbow, if the skin breaks open. How is this diagnosed? This condition may be diagnosed based on:  Your symptoms and medical history.  Any recent injuries you have had.  A physical exam.  X-rays to check for a bone spur or fracture.  Draining fluid from the bursa to test it for infection.  Blood tests to rule out gout or rheumatoid arthritis. How is this treated? Treatment for elbow bursitis depends on the cause. Treatment may include:  Medicines. These may include: ? Over-the-counter medicines to relieve pain and inflammation. ? Antibiotic medicines. ? Injections of anti-inflammatory medicines (steroids).  Draining fluid from the bursa.  Wrapping your elbow with a bandage.  Wearing elbow pads. If these treatments do not help, you may need surgery to remove the bursa. Follow these instructions at home: Medicines  Take  over-the-counter and prescription medicines only as told by your health care provider.  If you were prescribed an antibiotic medicine, take it as told by your health care provider. Do not stop taking the antibiotic even if you start to feel better. Managing pain, stiffness, and swelling   If directed, put ice on your elbow: ? Put ice in a plastic bag. ? Place a towel between your skin and the bag. ? Leave the ice on for 20 minutes, 2-3 times a day.  If your bursitis is caused by an injury, rest your elbow and wear your bandage as told by your health care provider.  Use elbow pads or elbow wraps to cushion your elbow as needed. General instructions  Avoid any activities that cause elbow pain. Ask your health care provider what activities are safe for you.  Keep all follow-up visits as told by your health care provider. This is important. Contact a health care provider if you have:  A fever.  Symptoms that do not get better with treatment.  Pain or swelling that: ? Gets worse. ? Goes away and then comes back.  Pus draining from your elbow. Get help right away if you have:  Trouble moving your arm, hand, or fingers. Summary  Elbow bursitis is inflammation of the fluid-filled sac (bursa) between the tip of your elbow bone (olecranon) and your skin.  Treatment for elbow bursitis depends on the cause. It may include medicines to relieve pain and inflammation, antibiotic medicines, and draining fluid from your elbow.  Contact a health care provider if your symptoms do not get better with treatment, or if your symptoms go away and then come back. This information is not intended to replace advice given to you by your health care provider. Make sure you discuss any questions you have with your health care provider. Document Revised: 06/01/2017 Document Reviewed: 05/29/2017 Elsevier Patient Education  2020 [employer].

## 2020-06-29 ENCOUNTER — Other Ambulatory Visit (HOSPITAL_COMMUNITY): Payer: Self-pay | Admitting: Cardiology

## 2020-06-29 ENCOUNTER — Other Ambulatory Visit (HOSPITAL_COMMUNITY): Payer: Self-pay

## 2020-06-29 MED ORDER — PREDNISONE 10 MG PO TABS
10.0000 mg | ORAL_TABLET | Freq: Every day | ORAL | 1 refills | Status: DC
Start: 2020-06-29 — End: 2020-09-07

## 2020-06-29 MED FILL — predniSONE 10 MG TABS: 10 | 30 days supply | Qty: 30 | Fill #0

## 2020-07-05 DIAGNOSIS — I509 Heart failure, unspecified: Secondary | ICD-10-CM | POA: Diagnosis not present

## 2020-07-05 DIAGNOSIS — I2510 Atherosclerotic heart disease of native coronary artery without angina pectoris: Secondary | ICD-10-CM | POA: Diagnosis not present

## 2020-07-06 ENCOUNTER — Ambulatory Visit (INDEPENDENT_AMBULATORY_CARE_PROVIDER_SITE_OTHER): Payer: Medicare HMO | Admitting: Internal Medicine

## 2020-07-06 ENCOUNTER — Encounter: Payer: Self-pay | Admitting: Internal Medicine

## 2020-07-06 ENCOUNTER — Other Ambulatory Visit: Payer: Self-pay

## 2020-07-06 VITALS — BP 104/74 | HR 90 | Ht 69.0 in | Wt 165.0 lb

## 2020-07-06 DIAGNOSIS — M130 Polyarthritis, unspecified: Secondary | ICD-10-CM | POA: Diagnosis not present

## 2020-07-06 DIAGNOSIS — E1165 Type 2 diabetes mellitus with hyperglycemia: Secondary | ICD-10-CM

## 2020-07-06 LAB — GLUCOSE, POCT (MANUAL RESULT ENTRY): POC Glucose: 78 mg/dl (ref 70–99)

## 2020-07-06 MED ORDER — FREESTYLE LIBRE 2 SENSOR MISC: 1.0000 | 11 refills | Status: DC

## 2020-07-06 NOTE — Research (Signed)
Pt here today doing ok. No changes in his device or meds.    Current Outpatient Medications:  .  ACCU-CHEK FASTCLIX LANCETS MISC, Use as directed once a day.  Dx code: E11.9, Disp: 100 each, Rfl: 1 .  albuterol (ACCUNEB) 0.63 MG/3ML nebulizer solution, Take 3 mLs (0.63 mg total) by nebulization every 6 (six) hours as needed for wheezing., Disp: 75 mL, Rfl: 12 .  albuterol (VENTOLIN HFA) 108 (90 Base) MCG/ACT inhaler, Inhale 2 puffs into the lungs every 6 (six) hours as needed for wheezing or shortness of breath. , Disp: , Rfl:  .  atorvastatin (LIPITOR) 40 MG tablet, TAKE 1 TABLET BY MOUTH AT BEDTIME., Disp: 30 tablet, Rfl: 3 .  blood glucose meter kit and supplies, Dispense based on patient and insurance preference. Use as directed once a day. Dx Code E11.9., Disp: 1 each, Rfl: 0 .  Blood Glucose Monitoring Suppl (ACCU-CHEK GUIDE) w/Device KIT, 1 each by Does not apply route daily. DX Code: E11.9, Disp: 1 kit, Rfl: 0 .  carvedilol (COREG) 6.25 MG tablet, Take 1 tablet (6.25 mg total) by mouth 2 (two) times daily with a meal., Disp: 60 tablet, Rfl: 6 .  Continuous Blood Gluc Sensor (FREESTYLE LIBRE 2 SENSOR) MISC, 1 Device by Does not apply route as directed., Disp: 2 each, Rfl: 11 .  digoxin (LANOXIN) 0.125 MG tablet, TAKE 1/2 TABLET BY MOUTH DAILY., Disp: 45 tablet, Rfl: 3 .  doxycycline (VIBRA-TABS) 100 MG tablet, Take 1 tablet (100 mg total) by mouth 2 (two) times daily., Disp: 20 tablet, Rfl: 0 .  ELIQUIS 5 MG TABS tablet, TAKE 1 TABLET BY MOUTH 2 TIMES DAILY., Disp: 180 tablet, Rfl: 3 .  ENTRESTO 24-26 MG, TAKE 1 TABLET BY MOUTH 2 (TWO) TIMES DAILY., Disp: 60 tablet, Rfl: 11 .  furosemide (LASIX) 40 MG tablet, Take 1 tablet (40 mg total) by mouth daily., Disp: 30 tablet, Rfl: 6 .  glucose blood (ACCU-CHEK GUIDE) test strip, Use as directed once a day.  Dx code: E11.9, Disp: 100 each, Rfl: 1 .  guaiFENesin (MUCINEX) 600 MG 12 hr tablet, Take 1 tablet (600 mg total) by mouth 2 (two) times daily  as needed for to loosen phlegm., Disp: , Rfl:  .  icosapent Ethyl (VASCEPA) 1 g capsule, Take 2 capsules (2 g total) by mouth 2 (two) times daily., Disp: 120 capsule, Rfl: 6 .  metFORMIN (GLUCOPHAGE) 500 MG tablet, Take 1 tablet (500 mg total) by mouth 2 (two) times daily with a meal., Disp: 180 tablet, Rfl: 3 .  mometasone-formoterol (DULERA) 200-5 MCG/ACT AERO, Inhale 2 puffs into the lungs 2 (two) times a day., Disp: 2 Inhaler, Rfl: 0 .  Nintedanib (OFEV) 150 MG CAPS, Take 150 mg by mouth daily., Disp: , Rfl:  .  nitroGLYCERIN (NITROSTAT) 0.4 MG SL tablet, Place 1 tablet (0.4 mg total) under the tongue every 5 (five) minutes as needed for chest pain., Disp: 25 tablet, Rfl: 3 .  OXYGEN, Inhale into the lungs. 3L prn , Disp: , Rfl:  .  predniSONE (DELTASONE) 10 MG tablet, Take 1 tablet (10 mg total) by mouth daily with breakfast., Disp: 30 tablet, Rfl: 1 .  spironolactone (ALDACTONE) 25 MG tablet, Take 1 tablet (25 mg total) by mouth daily., Disp: 90 tablet, Rfl: 3

## 2020-07-06 NOTE — Patient Instructions (Signed)
-  Continue Metformin 500 mg TWICE daily

## 2020-07-06 NOTE — Progress Notes (Signed)
Name: Vincent Chandler  Age/ Sex: 60 y.o., male   MRN/ DOB: [id number], 1960/09/04     PCP: Ann Held, DO   Reason for Endocrinology Evaluation: Type 2 Diabetes Mellitus  Initial Endocrine Consultative Visit:  02/25/2019    PATIENT IDENTIFIER: [name] is a 60 y.o. male with a past medical history of DM, ILD (diagnosed in 2019) , and CHF. The patient has followed with Endocrinology clinic since 02/25/2019 for consultative assistance with management of his diabetes.  DIABETIC HISTORY:  Mr. Gropp was diagnosed with T2 DM in 2011.  He was diagnosed with pulmonary fibrosis in 2019, he was started on prednisone at the time.  On his initial visit to our clinic he was on Tresiba and Metformin.  His hemoglobin A1c has ranged from 6.4% in 2019, peaking at 11.2% in 2020   By 10/2019 he had stopped basal insulin   SUBJECTIVE:   During the last visit (10/09/2018): A1c 6.9 %, continued metformin      Today (07/06/2020): [name] is here for a follow up on diabetes management. He does not check his glucose only if he feels weird.      He has been folowing with podiratry for foot lesions intermittently for the past year but approximately  3 weeks ago he was evaluated by them for a left foot redness, he was started on a course of Doxycycline, shortly after he was noted with a right elbow swelling and was restarted on another course of Doxycycline through PCP, today he noted a a left foot dorsal localized swelling. No pain, or fever but has been nauseous and achy.    He is on oxygen at night for ILD. He continues to have SOB     HOME DIABETES REGIMEN:  Metformin 500 mg twice daily    Statin: Yes ACE-I/ARB: N0    METER DOWNLOAD SUMMARY: Did not bring     DIABETIC COMPLICATIONS: Microvascular complications:   CKD, neuropathy  Denies: Retinopathy   Last Eye Exam: Completed 2019  Macrovascular complications:    CAD (S/P CABG in 2014)  Denies: CVA,  PVD   HISTORY:  Past Medical History:  Past Medical History:  Diagnosis Date  . AICD (automatic cardioverter/defibrillator) present   . Anginal pain ([place])   . Anxiety   . Asthma   . Atrial fibrillation-postoperative 11/28/2012  . Cardiomyopathy    alcohol use related  . CHF (congestive heart failure) ([place])   . Chronic back pain   . Coronary artery disease    drug eluting stent RCA 2005-EF 30%- s/p CABG x 4; 2/4 patent grafts with SVG-PLOM and SVG-RCA system totally occluded. There are collaterals from the LAD system to the RCA and the RCA is totally occluded proximally. There are no collaterals to the LCx territory. He then underwent successful PCI of the mid left circumflex artery with overlapping Synergy drug-eluting stents  . Depression    pt denies  . Diabetes mellitus type II dx'd in the 1990's  . GERD (gastroesophageal reflux disease)    yrs ago  . H/O hiatal hernia   . Hemorrhoids   . History of hypogonadism   . History of kidney stones   . Hyperlipidemia   . Hypertension   . OSA on CPAP    "mask is broken; working on getting a new one" (01/15/2015; 10/03/2017)  . Pneumonia    "3-4 times" (10/03/2017)  . Urinary incontinence    Past Surgical  History:  Past Surgical History:  Procedure Laterality Date  . A-FLUTTER ABLATION N/A 09/12/2017   Procedure: A-FLUTTER ABLATION;  Surgeon: Deboraha Sprang, MD;  Location: [place] [facility];  Service: Cardiovascular;  Laterality: N/A;  . CARDIAC DEFIBRILLATOR PLACEMENT  01/15/2015  . CARDIOVERSION N/A 08/10/2017   Procedure: CARDIOVERSION;  Surgeon: Larey Dresser, MD;  Location: [facility] ENDOSCOPY;  Service: Cardiovascular;  Laterality: N/A;  . CORONARY ANGIOPLASTY WITH STENT PLACEMENT  ~ 2003  . CORONARY ARTERY BYPASS GRAFT N/A 08/15/2012   Procedure: CORONARY ARTERY BYPASS GRAFTING (CABG);  Surgeon: Ivin Poot, MD;  Location: [facility];  Service: Open Heart Surgery;  Laterality: N/A;  Coronary Artery Bypass Grafting Times Four Using  Left Internal Mammary Artery and Right Saphenous Leg Vein Harvested Endoscopically  . EP IMPLANTABLE DEVICE N/A 01/15/2015   Procedure: ICD Implant;  Surgeon: Deboraha Sprang, MD;  Location: [place] [facility];  Service: Cardiovascular;  Laterality: N/A;  . LEFT HEART CATHETERIZATION WITH CORONARY ANGIOGRAM N/A 08/08/2012   Procedure: LEFT HEART CATHETERIZATION WITH CORONARY ANGIOGRAM;  Surgeon: Peter M Martinique, MD;  Location[facility] CATH LAB;  Service: Cardiovascular;  Laterality: N/A;  . LEFT HEART CATHETERIZATION WITH CORONARY/GRAFT ANGIOGRAM N/A 07/21/2014   Procedure: LEFT HEART CATHETERIZATION WITH Beatrix Fetters;  Surgeon: Larey Dresser, MD;  Location: [facility] CATH LAB;  Service: Cardiovascular;  Laterality: N/A;  . MULTIPLE EXTRACTIONS WITH ALVEOLOPLASTY N/A 10/04/2017   Procedure: Extraction of tooth #'s 5 and 14 with alveoloplasty and gross debridement of remaining teeth;  Surgeon: [name], DDS;  Location: [place];  Service: Oral Surgery;  Laterality: N/A;  . PERCUTANEOUS CORONARY STENT INTERVENTION (PCI-S)  07/21/2014   Procedure: PERCUTANEOUS CORONARY STENT INTERVENTION (PCI-S);  Surgeon: Larey Dresser, MD;  Location: [facility] CATH LAB;  Service: Cardiovascular;;  . REFRACTIVE SURGERY Bilateral 1990's  . RIGHT HEART CATH N/A 10/03/2017   Procedure: RIGHT HEART CATH;  Surgeon: Larey Dresser, MD;  Location: [place] CV LAB;  Service: Cardiovascular;  Laterality: N/A;  . RIGHT HEART CATH N/A 12/11/2018   Procedure: RIGHT HEART CATH;  Surgeon: Larey Dresser, MD;  Location: [place] [facility] LAB;  Service: Cardiovascular;  Laterality: N/A;  . TEE WITHOUT CARDIOVERSION N/A 08/10/2017   Procedure: TRANSESOPHAGEAL ECHOCARDIOGRAM (TEE);  Surgeon: Larey Dresser, MD;  Location: [facility] ENDOSCOPY;  Service: Cardiovascular;  Laterality: N/A;  . TONSILLECTOMY  ~ 1970  . VIDEO BRONCHOSCOPY Bilateral 11/28/2017   Procedure: VIDEO BRONCHOSCOPY WITHOUT FLUORO;  Surgeon: Brand Males, MD;  Location:  [facility] ENDOSCOPY;  Service: Endoscopy;  Laterality: Bilateral;    Social History:  reports that he has never smoked. His smokeless tobacco use includes chew. He reports current alcohol use of about 3.0 standard drinks of alcohol per week. He reports that he does not use drugs. Family History:  Family History  Problem Relation Age of Onset  . Hypertension Mother   . Diabetes Mother   . Hypertension Father   . Diabetes Father   . Hyperlipidemia Father      HOME MEDICATIONS: Allergies as of 07/06/2020   No Known Allergies     Medication List       Accurate as of July 06, 2020 12:03 PM. If you have any questions, ask your nurse or doctor.        Accu-Chek FastClix Lancets Misc Use as directed once a day.  Dx code: E11.9   Accu-Chek Guide w/Device Kit 1 each by Does not apply route daily. DX Code:  E11.9   albuterol 108 (90 Base) MCG/ACT inhaler Commonly known as: VENTOLIN HFA Inhale 2 puffs into the lungs every 6 (six) hours as needed for wheezing or shortness of breath.   albuterol 0.63 MG/3ML nebulizer solution Commonly known as: ACCUNEB Take 3 mLs (0.63 mg total) by nebulization every 6 (six) hours as needed for wheezing.   atorvastatin 40 MG tablet Commonly known as: LIPITOR TAKE 1 TABLET BY MOUTH AT BEDTIME.   blood glucose meter kit and supplies Dispense based on patient and insurance preference. Use as directed once a day. Dx Code E11.9.   carvedilol 6.25 MG tablet Commonly known as: COREG Take 1 tablet (6.25 mg total) by mouth 2 (two) times daily with a meal.   digoxin 0.125 MG tablet Commonly known as: LANOXIN TAKE 1/2 TABLET BY MOUTH DAILY.   doxycycline 100 MG tablet Commonly known as: VIBRA-TABS Take 1 tablet (100 mg total) by mouth 2 (two) times daily.   Eliquis 5 MG Tabs tablet Generic drug: apixaban TAKE 1 TABLET BY MOUTH 2 TIMES DAILY.   Entresto 24-26 MG Generic drug: sacubitril-valsartan TAKE 1 TABLET BY MOUTH 2 (TWO) TIMES DAILY.    FreeStyle Libre 2 Sensor Misc 1 Device by Does not apply route as directed. Started by: Dorita Sciara, MD   furosemide 40 MG tablet Commonly known as: LASIX Take 1 tablet (40 mg total) by mouth daily.   glucose blood test strip Commonly known as: Accu-Chek Guide Use as directed once a day.  Dx code: E11.9   guaiFENesin 600 MG 12 hr tablet Commonly known as: MUCINEX Take 1 tablet (600 mg total) by mouth 2 (two) times daily as needed for to loosen phlegm.   icosapent Ethyl 1 g capsule Commonly known as: Vascepa Take 2 capsules (2 g total) by mouth 2 (two) times daily.   metFORMIN 500 MG tablet Commonly known as: GLUCOPHAGE Take 1 tablet (500 mg total) by mouth 2 (two) times daily with a meal.   mometasone-formoterol 200-5 MCG/ACT Aero Commonly known as: DULERA Inhale 2 puffs into the lungs 2 (two) times a day.   nitroGLYCERIN 0.4 MG SL tablet Commonly known as: NITROSTAT Place 1 tablet (0.4 mg total) under the tongue every 5 (five) minutes as needed for chest pain.   Ofev 150 MG Caps Generic drug: Nintedanib Take 150 mg by mouth daily.   OXYGEN Inhale into the lungs. 3L prn   predniSONE 10 MG tablet Commonly known as: DELTASONE Take 1 tablet (10 mg total) by mouth daily with breakfast.   spironolactone 25 MG tablet Commonly known as: ALDACTONE Take 1 tablet (25 mg total) by mouth daily.        OBJECTIVE:   Vital Signs: BP 104/74   Pulse 90   Ht _0  (1.753 m)   Wt 165 lb (74.8 kg)   SpO2 90%   BMI 24.37 kg/m   Wt Readings from Last 3 Encounters:  07/06/20 165 lb (74.8 kg)  06/14/20 164 lb 12.8 oz (74.8 kg)  06/09/20 167 lb (75.8 kg)     Exam: General: Pt appears well and is in NAD  Extremities: No pretibial edema.  Right elbow swelling and erythema   Neuro: MS is good with appropriate affect, pt is alert and Ox3    DM foot exam 07/06/2020 The skin of the feet is  Shows a scab proximal to the right 5th MT head. Has localized swelling and  erythema at the dorsal lateral surface of the left foot just  distal to the ankle .  The pedal pulses are 2+ on right and 2+ on left. The sensation is decreased to a screening 5.07, 10 gram monofilament bilaterally    DATA REVIEWED:  Lab Results  Component Value Date   HGBA1C 6.8 (H) 06/14/2020   HGBA1C 6.9 (A) 10/09/2019   HGBA1C 11.2 (H) 02/07/2019   Lab Results  Component Value Date   MICROALBUR 1.2 06/14/2020   LDLCALC 54 06/14/2020   CREATININE 1.55 (H) 06/14/2020   Lab Results  Component Value Date   MICRALBCREAT 1.0 06/14/2020     Lab Results  Component Value Date   CHOL 145 06/14/2020   HDL 55.50 06/14/2020   LDLCALC 54 06/14/2020   LDLDIRECT 69.0 11/20/2017   TRIG 178.0 (H) 06/14/2020   CHOLHDL 3 06/14/2020       In-Office Bg 78 mg/dL   ASSESSMENT / PLAN / RECOMMENDATIONS:   1) Type 2 Diabetes Mellitus, optimally controlled, With  neuropathic, CKD III and macrovascular complications - Most recent A1c of 6.8%. Goal A1c < 7.0 %.    - A1c at goal  -Will continue with metformin, if his GFR goes  below 35 , will have to stop his Metformin, most recent GFR was 48. - He is interested in CGM, will prescribe Freestyle libre, but he understands that medicare does not cover unless on insulin.   MEDICATIONS: - Continue Metformin 500 mg TWice daily    EDUCATION / INSTRUCTIONS:  BG monitoring instructions: Patient is instructed to check his blood sugars 1 times a day, fasting.  Call [facility] if: BG persistently < 70 . I reviewed the Rule of 15 for the treatment of hypoglycemia in detail with the patient. Literature supplied.   2) Diabetic complications:   Eye: Unknown to have  diabetic retinopathy.  I have encouraged him to have an eye exam  Neuro/ Feet: Does have known diabetic peripheral neuropathy .   Renal: Patient does have known baseline CKD. He   is  on an ACEI/ARB at present. Up to date on microalbumin uria.    3) Polyarthritis  :  - Right elbow and left foot ( just distal to the ankle) swelling. Pt is scheduled to see a sport's medicine specialist. Two courses of Abx have not helped thus far.  - I suspect his nausea has to do with the Doxycycline.     F/U in 6 months    Signed electronically by: Mack Guise, MD  [facility] Endocrinology  [address] [place], [place] [address] Phone: [phone] FAX: [phone]   CC: [address] [place] [address] Phone: [phone]  Fax: [phone]  Return to Endocrinology clinic as below: Future Appointments  Date Time Provider [place]  07/07/2020  2:45 PM Silverio Decamp, MD [facility] None  07/30/2020  2:00 PM Larey Dresser, MD [facility] None  07/30/2020  2:30 PM [place] [facility] None  09/17/2020  7:25 AM [facility] DEVICE REMOTES [facility]  12/13/2020  1:00 PM Ann Held, DO [facility]  12/17/2020  7:25 AM [facility] DEVICE REMOTES [facility]

## 2020-07-07 ENCOUNTER — Other Ambulatory Visit: Payer: Self-pay

## 2020-07-07 ENCOUNTER — Ambulatory Visit (INDEPENDENT_AMBULATORY_CARE_PROVIDER_SITE_OTHER): Payer: Medicare HMO | Admitting: Sports Medicine

## 2020-07-07 ENCOUNTER — Ambulatory Visit (INDEPENDENT_AMBULATORY_CARE_PROVIDER_SITE_OTHER): Payer: Medicare HMO

## 2020-07-07 DIAGNOSIS — M7021 Olecranon bursitis, right elbow: Secondary | ICD-10-CM

## 2020-07-07 DIAGNOSIS — R2242 Localized swelling, mass and lump, left lower limb: Secondary | ICD-10-CM

## 2020-07-07 DIAGNOSIS — M109 Gout, unspecified: Secondary | ICD-10-CM | POA: Insufficient documentation

## 2020-07-07 DIAGNOSIS — M1A9XX1 Chronic gout, unspecified, with tophus (tophi): Secondary | ICD-10-CM

## 2020-07-07 MED ORDER — COLCHICINE 0.6 MG PO TABS: 0.6000 mg | ORAL_TABLET | Freq: Two times a day (BID) | ORAL | 2 refills | Status: DC

## 2020-07-07 NOTE — Patient Instructions (Signed)
Low-Purine Eating Plan °A low-purine eating plan involves making food choices to limit your intake of purine. Purine is a kind of uric acid. Too much uric acid in your blood can cause certain conditions, such as gout and kidney stones. Eating a low-purine diet can help control these conditions. °What are tips for following this plan? °Reading food labels ° °· Avoid foods with saturated or Trans fat. °· Check the ingredient list of grains-based foods, such as bread and cereal, to make sure that they contain whole grains. °· Check the ingredient list of sauces or soups to make sure they do not contain meat or fish. °· When choosing soft drinks, check the ingredient list to make sure they do not contain high-fructose corn syrup. °Shopping °· Buy plenty of fresh fruits and vegetables. °· Avoid buying canned or fresh fish. °· Buy dairy products labeled as low-fat or nonfat. °· Avoid buying premade or processed foods. These foods are often high in fat, salt (sodium), and added sugar. °Cooking °· Use olive oil instead of butter when cooking. Oils like olive oil, canola oil, and sunflower oil contain healthy fats. °Meal planning °· Learn which foods do or do not affect you. If you find out that a food tends to cause your gout symptoms to flare up, avoid eating that food. You can enjoy foods that do not cause problems. If you have any questions about a food item, talk with your dietitian or health care provider. °· Limit foods high in fat, especially saturated fat. Fat makes it harder for your body to get rid of uric acid. °· Choose foods that are lower in fat and are lean sources of protein. °General guidelines °· Limit alcohol intake to no more than 1 drink a day for nonpregnant women and 2 drinks a day for men. One drink equals 12 oz of beer, 5 oz of wine, or 1½ oz of hard liquor. Alcohol can affect the way your body gets rid of uric acid. °· Drink plenty of water to keep your urine clear or pale yellow. Fluids can help  remove uric acid from your body. °· If directed by your health care provider, take a vitamin C supplement. °· Work with your health care provider and dietitian to develop a plan to achieve or maintain a healthy weight. Losing weight can help reduce uric acid in your blood. °What foods are recommended? °The items listed may not be a complete list. Talk with your dietitian about what dietary choices are best for you. °Foods low in purines °Foods low in purines do not need to be limited. These include: °· All fruits. °· All low-purine vegetables, pickles, and olives. °· Breads, pasta, rice, cornbread, and popcorn. Cake and other baked goods. °· All dairy foods. °· Eggs, nuts, and nut butters. °· Spices and condiments, such as salt, herbs, and vinegar. °· Plant oils, butter, and margarine. °· Water, sugar-free soft drinks, tea, coffee, and cocoa. °· Vegetable-based soups, broths, sauces, and gravies. °Foods moderate in purines °Foods moderate in purines should be limited to the amounts listed. °· ½ cup of asparagus, cauliflower, spinach, mushrooms, or green peas, each day. °· 2/3 cup uncooked oatmeal, each day. °· ¼ cup dry wheat bran or wheat germ, each day. °· 2-3 ounces of meat or poultry, each day. °· 4-6 ounces of shellfish, such as crab, lobster, oysters, or shrimp, each day. °· 1 cup cooked beans, peas, or lentils, each day. °· Soup, broths, or bouillon made from meat or   fish. Limit these foods as much as possible. What foods are not recommended? The items listed may not be a complete list. Talk with your dietitian about what dietary choices are best for you. Limit your intake of foods high in purines, including:  Beer and other alcohol.  Meat-based gravy or sauce.  Canned or fresh fish, such as: ? Anchovies, sardines, herring, and tuna. ? Mussels and scallops. ? Codfish, trout, and haddock.  Berniece Salines.  Organ meats, such as: ? Liver or kidney. ? Tripe. ? Sweetbreads (thymus gland or  pancreas).  Wild Clinical biochemist.  Yeast or yeast extract supplements.  Drinks sweetened with high-fructose corn syrup. Summary  Eating a low-purine diet can help control conditions caused by too much uric acid in the body, such as gout or kidney stones.  Choose low-purine foods, limit alcohol, and limit foods high in fat.  You will learn over time which foods do or do not affect you. If you find out that a food tends to cause your gout symptoms to flare up, avoid eating that food. This information is not intended to replace advice given to you by your health care provider. Make sure you discuss any questions you have with your health care provider. Document Revised: 06/01/2017 Document Reviewed: 08/02/2016 Elsevier Patient Education  2020 [employer].

## 2020-07-07 NOTE — Progress Notes (Addendum)
° ° °  Procedures performed today:    Procedure: Real-time Ultrasound Guided  aspiration/injection of left foot dorsal midtarsal joint Device: Samsung [id number]  Verbal informed consent obtained.  Time-out conducted.  Noted no overlying erythema, induration, or other signs of local infection.  Skin prepped in a sterile fashion.  Local anesthesia: Topical Ethyl chloride.  With sterile technique and under real time ultrasound guidance:  I aspirated approximately 3 mL of whitish chalky fluid consistent with gouty tophus, syringe switched and 1 cc lidocaine, 1 cc kenalog 40 injected easily.  We were able to express some more of the tophus as well. Completed without difficulty  Advised to call if fevers/chills, erythema, induration, drainage, or persistent bleeding.  Images permanently stored and available for review in PACS.  Impression: Technically successful ultrasound guided injection.  Independent interpretation of notes and tests performed by another provider:   None.  Brief History, Exam, Impression, and Recommendations:    Olecranon bursitis of right elbow Vincent Chandler continues to have some swelling and erythema of his right elbow in spite of prednisone and doxycycline. He has developed a similar process on his left foot, see below.  Gout Vincent Chandler has olecranon bursitis initially thought to be septic, he has undergone a couple of courses of antibiotics, he also developed swelling, redness on his left foot at the midtarsal joint, also suspected to be an infection by his podiatrist, I can almost feel gouty tophi in this collection, we drained and injected the collection, the fluid will be sent off for crystal analysis and cell counts, cultures. Adding uric acid levels and colchicine.  Uric acid levels are high, because the aspirate also was chalky it was likely a tophus. This is likely combination of idiopathic gout plus gout secondary to renal insufficiency. Starting allopurinol 300 daily, we will  keep an eye on renal function, he will also only do the colchicine for a week. At the 1 month follow-up we will recheck uric acid levels and a CMP.    ___________________________________________ [name] Dianah Field, M.D., ABFM., CAQSM. Primary Care and [place] Instructor of [place] [employer] [facility] [employer]

## 2020-07-07 NOTE — Assessment & Plan Note (Signed)
Vincent Chandler continues to have some swelling and erythema of his right elbow in spite of prednisone and doxycycline. He has developed a similar process on his left foot, see below.

## 2020-07-07 NOTE — Assessment & Plan Note (Signed)
Vincent Chandler has olecranon bursitis initially thought to be septic, he has undergone a couple of courses of antibiotics, he also developed swelling, redness on his left foot at the midtarsal joint, also suspected to be an infection by his podiatrist, I can almost feel gouty tophi in this collection, we drained and injected the collection, the fluid will be sent off for crystal analysis and cell counts, cultures. Adding uric acid levels and colchicine.  Uric acid levels are high, because the aspirate also was chalky it was likely a tophus. This is likely combination of idiopathic gout plus gout secondary to renal insufficiency. Starting allopurinol 300 daily, we will keep an eye on renal function, he will also only do the colchicine for a week. At the 1 month follow-up we will recheck uric acid levels and a CMP.

## 2020-07-08 DIAGNOSIS — M2550 Pain in unspecified joint: Secondary | ICD-10-CM | POA: Diagnosis not present

## 2020-07-08 DIAGNOSIS — R0902 Hypoxemia: Secondary | ICD-10-CM | POA: Diagnosis not present

## 2020-07-08 DIAGNOSIS — R110 Nausea: Secondary | ICD-10-CM | POA: Diagnosis not present

## 2020-07-08 DIAGNOSIS — R6883 Chills (without fever): Secondary | ICD-10-CM | POA: Diagnosis not present

## 2020-07-08 DIAGNOSIS — Z20828 Contact with and (suspected) exposure to other viral communicable diseases: Secondary | ICD-10-CM | POA: Diagnosis not present

## 2020-07-08 LAB — COMPREHENSIVE METABOLIC PANEL
AG Ratio: 1.4 (calc) (ref 1.0–2.5)
ALT: 11 U/L (ref 9–46)
AST: 14 U/L (ref 10–35)
Albumin: 3.7 g/dL (ref 3.6–5.1)
Alkaline phosphatase (APISO): 52 U/L (ref 35–144)
BUN/Creatinine Ratio: 21 (calc) (ref 6–22)
BUN: 38 mg/dL — ABNORMAL HIGH (ref 7–25)
CO2: 24 mmol/L (ref 20–32)
Calcium: 9 mg/dL (ref 8.6–10.3)
Chloride: 104 mmol/L (ref 98–110)
Creat: 1.77 mg/dL — ABNORMAL HIGH (ref 0.70–1.33)
Globulin: 2.7 g/dL (calc) (ref 1.9–3.7)
Glucose, Bld: 112 mg/dL (ref 65–139)
Potassium: 4.7 mmol/L (ref 3.5–5.3)
Sodium: 140 mmol/L (ref 135–146)
Total Bilirubin: 0.5 mg/dL (ref 0.2–1.2)
Total Protein: 6.4 g/dL (ref 6.1–8.1)

## 2020-07-08 LAB — CBC
HCT: 41.5 % (ref 38.5–50.0)
Hemoglobin: 14.2 g/dL (ref 13.2–17.1)
MCH: 37 pg — ABNORMAL HIGH (ref 27.0–33.0)
MCHC: 34.2 g/dL (ref 32.0–36.0)
MCV: 108.1 fL — ABNORMAL HIGH (ref 80.0–100.0)
MPV: 10.4 fL (ref 7.5–12.5)
Platelets: 237 10*3/uL (ref 140–400)
RBC: 3.84 10*6/uL — ABNORMAL LOW (ref 4.20–5.80)
RDW: 14 % (ref 11.0–15.0)
WBC: 6.4 10*3/uL (ref 3.8–10.8)

## 2020-07-08 LAB — URIC ACID: Uric Acid, Serum: 10.6 mg/dL — ABNORMAL HIGH (ref 4.0–8.0)

## 2020-07-09 DIAGNOSIS — J9611 Chronic respiratory failure with hypoxia: Secondary | ICD-10-CM | POA: Diagnosis not present

## 2020-07-09 DIAGNOSIS — J849 Interstitial pulmonary disease, unspecified: Secondary | ICD-10-CM | POA: Diagnosis not present

## 2020-07-09 DIAGNOSIS — G4733 Obstructive sleep apnea (adult) (pediatric): Secondary | ICD-10-CM | POA: Diagnosis not present

## 2020-07-14 ENCOUNTER — Other Ambulatory Visit: Payer: Self-pay

## 2020-07-14 ENCOUNTER — Ambulatory Visit (INDEPENDENT_AMBULATORY_CARE_PROVIDER_SITE_OTHER): Payer: Medicare HMO | Admitting: Podiatry

## 2020-07-14 DIAGNOSIS — E1165 Type 2 diabetes mellitus with hyperglycemia: Secondary | ICD-10-CM

## 2020-07-14 DIAGNOSIS — S90412A Abrasion, left great toe, initial encounter: Secondary | ICD-10-CM | POA: Diagnosis not present

## 2020-07-15 MED FILL — ENTRESTO 24 MG-26 MG TABLET: 24-26 | 30 days supply | Qty: 60 | Fill #3

## 2020-07-15 MED FILL — ATORVASTATIN 40 MG TABLET: 40 | 30 days supply | Qty: 30 | Fill #2

## 2020-07-15 MED FILL — predniSONE 10 MG TABS: 10 | 30 days supply | Qty: 30 | Fill #0

## 2020-07-15 MED FILL — METFORMIN HCL 500 MG TABS: 500 | 90 days supply | Qty: 180 | Fill #2

## 2020-07-16 MED FILL — FUROSEMIDE 40 MG TAB: 40 | 30 days supply | Qty: 30 | Fill #3

## 2020-07-20 ENCOUNTER — Encounter: Payer: Self-pay | Admitting: Podiatry

## 2020-07-20 NOTE — Progress Notes (Signed)
Subjective:  Patient ID: Vincent Chandler, male    DOB: 1961/02/23,  MRN: [id number]  Chief Complaint  Patient presents with  . Foot Pain    Right foot place on the side of the foot. Pt stated that it is painful when he walks.    60 y.o. male presents for wound care.  Patient presents with a follow-up of left hallux lateral wound which patient states that there may be a spot that opened up he just wants to get it evaluated.  The left ankle area has clinically healed.  He has completed his antibiotic course.  He denies any other acute complaints.   Review of Systems: Negative except as noted in the HPI. Denies N/V/F/Ch.  Past Medical History:  Diagnosis Date  . AICD (automatic cardioverter/defibrillator) present   . Anginal pain ([place])   . Anxiety   . Asthma   . Atrial fibrillation-postoperative 11/28/2012  . Cardiomyopathy    alcohol use related  . CHF (congestive heart failure) ([place])   . Chronic back pain   . Coronary artery disease    drug eluting stent RCA 2005-EF 30%- s/p CABG x 4; 2/4 patent grafts with SVG-PLOM and SVG-RCA system totally occluded. There are collaterals from the LAD system to the RCA and the RCA is totally occluded proximally. There are no collaterals to the LCx territory. He then underwent successful PCI of the mid left circumflex artery with overlapping Synergy drug-eluting stents  . Depression    pt denies  . Diabetes mellitus type II dx'd in the 1990's  . GERD (gastroesophageal reflux disease)    yrs ago  . H/O hiatal hernia   . Hemorrhoids   . History of hypogonadism   . History of kidney stones   . Hyperlipidemia   . Hypertension   . OSA on CPAP    "mask is broken; working on getting a new one" (01/15/2015; 10/03/2017)  . Pneumonia    "3-4 times" (10/03/2017)  . Urinary incontinence     Current Outpatient Medications:  .  ACCU-CHEK FASTCLIX LANCETS MISC, Use as directed once a day.  Dx code: E11.9, Disp: 100 each, Rfl: 1 .  albuterol (ACCUNEB)  0.63 MG/3ML nebulizer solution, Take 3 mLs (0.63 mg total) by nebulization every 6 (six) hours as needed for wheezing., Disp: 75 mL, Rfl: 12 .  albuterol (VENTOLIN HFA) 108 (90 Base) MCG/ACT inhaler, Inhale 2 puffs into the lungs every 6 (six) hours as needed for wheezing or shortness of breath. , Disp: , Rfl:  .  atorvastatin (LIPITOR) 40 MG tablet, TAKE 1 TABLET BY MOUTH AT BEDTIME., Disp: 30 tablet, Rfl: 3 .  blood glucose meter kit and supplies, Dispense based on patient and insurance preference. Use as directed once a day. Dx Code E11.9., Disp: 1 each, Rfl: 0 .  Blood Glucose Monitoring Suppl (ACCU-CHEK GUIDE) w/Device KIT, 1 each by Does not apply route daily. DX Code: E11.9, Disp: 1 kit, Rfl: 0 .  carvedilol (COREG) 6.25 MG tablet, Take 1 tablet (6.25 mg total) by mouth 2 (two) times daily with a meal., Disp: 60 tablet, Rfl: 6 .  colchicine 0.6 MG tablet, Take 1 tablet (0.6 mg total) by mouth 2 (two) times daily., Disp: 60 tablet, Rfl: 2 .  Continuous Blood Gluc Sensor (FREESTYLE LIBRE 2 SENSOR) MISC, 1 Device by Does not apply route as directed., Disp: 2 each, Rfl: 11 .  digoxin (LANOXIN) 0.125 MG tablet, TAKE 1/2 TABLET BY MOUTH DAILY., Disp: 45 tablet, Rfl: 3 .  doxycycline (VIBRA-TABS) 100 MG tablet, Take 1 tablet (100 mg total) by mouth 2 (two) times daily., Disp: 20 tablet, Rfl: 0 .  ELIQUIS 5 MG TABS tablet, TAKE 1 TABLET BY MOUTH 2 TIMES DAILY., Disp: 180 tablet, Rfl: 3 .  ENTRESTO 24-26 MG, TAKE 1 TABLET BY MOUTH 2 (TWO) TIMES DAILY., Disp: 60 tablet, Rfl: 11 .  furosemide (LASIX) 40 MG tablet, Take 1 tablet (40 mg total) by mouth daily., Disp: 30 tablet, Rfl: 6 .  glucose blood (ACCU-CHEK GUIDE) test strip, Use as directed once a day.  Dx code: E11.9, Disp: 100 each, Rfl: 1 .  guaiFENesin (MUCINEX) 600 MG 12 hr tablet, Take 1 tablet (600 mg total) by mouth 2 (two) times daily as needed for to loosen phlegm., Disp: , Rfl:  .  icosapent Ethyl (VASCEPA) 1 g capsule, Take 2 capsules (2 g  total) by mouth 2 (two) times daily., Disp: 120 capsule, Rfl: 6 .  metFORMIN (GLUCOPHAGE) 500 MG tablet, Take 1 tablet (500 mg total) by mouth 2 (two) times daily with a meal., Disp: 180 tablet, Rfl: 3 .  mometasone-formoterol (DULERA) 200-5 MCG/ACT AERO, Inhale 2 puffs into the lungs 2 (two) times a day., Disp: 2 Inhaler, Rfl: 0 .  Nintedanib (OFEV) 150 MG CAPS, Take 150 mg by mouth daily., Disp: , Rfl:  .  nitroGLYCERIN (NITROSTAT) 0.4 MG SL tablet, Place 1 tablet (0.4 mg total) under the tongue every 5 (five) minutes as needed for chest pain., Disp: 25 tablet, Rfl: 3 .  OXYGEN, Inhale into the lungs. 3L prn , Disp: , Rfl:  .  predniSONE (DELTASONE) 10 MG tablet, Take 1 tablet (10 mg total) by mouth daily with breakfast., Disp: 30 tablet, Rfl: 1 .  spironolactone (ALDACTONE) 25 MG tablet, Take 1 tablet (25 mg total) by mouth daily., Disp: 90 tablet, Rfl: 3  Social History   Tobacco Use  Smoking Status Never Smoker  Smokeless Tobacco Current User  . Types: Chew    No Known Allergies Objective:  There were no vitals filed for this visit. There is no height or weight on file to calculate BMI. Constitutional Well developed. Well nourished.  Vascular Dorsalis pedis pulses palpable bilaterally. Posterior tibial pulses palpable bilaterally. Capillary refill normal to all digits.  No cyanosis or clubbing noted. Pedal hair growth normal.  Neurologic Normal speech. Oriented to person, place, and time. Protective sensation absent  Dermatologic  left lateral hallux ulceration completely epithelialized.  No concern for wound noted at this time.  Left lateral ankle superficial dermatitis has clinically healed.  No concern for abscess or ulceration noted..  Orthopedic: No pain to palpation either foot.   Radiographs: 3 views of skeletally mature the left foot: No signs of osteomyelitis noted.  No cortical destruction noted.  No soft tissue emphysema noted. Assessment:   1. Abrasion of left  great toe, initial encounter   2. Uncontrolled type 2 diabetes mellitus with hyperglycemia (Vincent Chandler)    Plan:  Patient was evaluated and treated and all questions answered.  Left lateral ankle superficial abscess -Clinically healed.  Skin completely epithelialized.  No concern for deeper infection noted.  Ulcer left hallux lateral aspect limited to the breakdown of the skin and now with medial aspect due to abrasion/friction blister -Continue to be clinically healed and reepithelialized.  No concern for underlying ulceration noted.  No clinical signs of infection noted.  No follow-ups on file.

## 2020-07-21 ENCOUNTER — Other Ambulatory Visit: Payer: Self-pay

## 2020-07-21 ENCOUNTER — Other Ambulatory Visit (HOSPITAL_COMMUNITY): Payer: Self-pay

## 2020-07-21 ENCOUNTER — Encounter (HOSPITAL_COMMUNITY): Payer: Self-pay | Admitting: Cardiology

## 2020-07-21 ENCOUNTER — Ambulatory Visit (HOSPITAL_COMMUNITY)
Admission: RE | Admit: 2020-07-21 | Discharge: 2020-07-21 | Disposition: A | Discharge status: Home / Self Care | Payer: Medicare HMO | Source: Ambulatory Visit | Attending: Cardiology | Admitting: Cardiology

## 2020-07-21 DIAGNOSIS — I255 Ischemic cardiomyopathy: Secondary | ICD-10-CM | POA: Diagnosis not present

## 2020-07-21 DIAGNOSIS — Z8249 Family history of ischemic heart disease and other diseases of the circulatory system: Secondary | ICD-10-CM | POA: Diagnosis not present

## 2020-07-21 DIAGNOSIS — Z7984 Long term (current) use of oral hypoglycemic drugs: Secondary | ICD-10-CM | POA: Insufficient documentation

## 2020-07-21 DIAGNOSIS — Z79899 Other long term (current) drug therapy: Secondary | ICD-10-CM | POA: Insufficient documentation

## 2020-07-21 DIAGNOSIS — G4733 Obstructive sleep apnea (adult) (pediatric): Secondary | ICD-10-CM | POA: Diagnosis not present

## 2020-07-21 DIAGNOSIS — I480 Paroxysmal atrial fibrillation: Secondary | ICD-10-CM | POA: Diagnosis not present

## 2020-07-21 DIAGNOSIS — I2723 Pulmonary hypertension due to lung diseases and hypoxia: Secondary | ICD-10-CM | POA: Diagnosis not present

## 2020-07-21 DIAGNOSIS — Z9981 Dependence on supplemental oxygen: Secondary | ICD-10-CM | POA: Insufficient documentation

## 2020-07-21 DIAGNOSIS — J8410 Pulmonary fibrosis, unspecified: Secondary | ICD-10-CM | POA: Insufficient documentation

## 2020-07-21 DIAGNOSIS — I5022 Chronic systolic (congestive) heart failure: Secondary | ICD-10-CM

## 2020-07-21 DIAGNOSIS — Z833 Family history of diabetes mellitus: Secondary | ICD-10-CM | POA: Insufficient documentation

## 2020-07-21 DIAGNOSIS — Z7951 Long term (current) use of inhaled steroids: Secondary | ICD-10-CM | POA: Insufficient documentation

## 2020-07-21 DIAGNOSIS — N183 Chronic kidney disease, stage 3 unspecified: Secondary | ICD-10-CM | POA: Diagnosis not present

## 2020-07-21 DIAGNOSIS — I130 Hypertensive heart and chronic kidney disease with heart failure and stage 1 through stage 4 chronic kidney disease, or unspecified chronic kidney disease: Secondary | ICD-10-CM | POA: Diagnosis not present

## 2020-07-21 DIAGNOSIS — Z951 Presence of aortocoronary bypass graft: Secondary | ICD-10-CM | POA: Diagnosis not present

## 2020-07-21 DIAGNOSIS — F17220 Nicotine dependence, chewing tobacco, uncomplicated: Secondary | ICD-10-CM | POA: Diagnosis not present

## 2020-07-21 DIAGNOSIS — I25110 Atherosclerotic heart disease of native coronary artery with unstable angina pectoris: Secondary | ICD-10-CM | POA: Insufficient documentation

## 2020-07-21 DIAGNOSIS — I4892 Unspecified atrial flutter: Secondary | ICD-10-CM | POA: Insufficient documentation

## 2020-07-21 DIAGNOSIS — I5023 Acute on chronic systolic (congestive) heart failure: Secondary | ICD-10-CM | POA: Diagnosis not present

## 2020-07-21 DIAGNOSIS — R69 Illness, unspecified: Secondary | ICD-10-CM | POA: Diagnosis not present

## 2020-07-21 DIAGNOSIS — Z7901 Long term (current) use of anticoagulants: Secondary | ICD-10-CM | POA: Insufficient documentation

## 2020-07-21 DIAGNOSIS — E1122 Type 2 diabetes mellitus with diabetic chronic kidney disease: Secondary | ICD-10-CM | POA: Diagnosis not present

## 2020-07-21 LAB — DIGOXIN LEVEL: Digoxin Level: 0.8 ng/mL (ref 0.8–2.0)

## 2020-07-21 LAB — CBC
HCT: 43.2 % (ref 39.0–52.0)
Hemoglobin: 14.4 g/dL (ref 13.0–17.0)
MCH: 37.3 pg — ABNORMAL HIGH (ref 26.0–34.0)
MCHC: 33.3 g/dL (ref 30.0–36.0)
MCV: 111.9 fL — ABNORMAL HIGH (ref 80.0–100.0)
Platelets: 183 10*3/uL (ref 150–400)
RBC: 3.86 MIL/uL — ABNORMAL LOW (ref 4.22–5.81)
RDW: 14.5 % (ref 11.5–15.5)
WBC: 6.9 10*3/uL (ref 4.0–10.5)
nRBC: 0 % (ref 0.0–0.2)

## 2020-07-21 LAB — BASIC METABOLIC PANEL
Anion gap: 10 (ref 5–15)
BUN: 68 mg/dL — ABNORMAL HIGH (ref 6–20)
CO2: 21 mmol/L — ABNORMAL LOW (ref 22–32)
Calcium: 9.6 mg/dL (ref 8.9–10.3)
Chloride: 109 mmol/L (ref 98–111)
Creatinine, Ser: 2.05 mg/dL — ABNORMAL HIGH (ref 0.61–1.24)
GFR, Estimated: 37 mL/min — ABNORMAL LOW (ref >60)
Glucose, Bld: 115 mg/dL — ABNORMAL HIGH (ref 70–99)
Potassium: 5.1 mmol/L (ref 3.5–5.1)
Sodium: 140 mmol/L (ref 135–145)

## 2020-07-21 MED ORDER — NITROGLYCERIN 0.4 MG SL SUBL: 0.4000 mg | SUBLINGUAL_TABLET | SUBLINGUAL | 3 refills | Status: AC | PRN

## 2020-07-21 MED ORDER — SPIRONOLACTONE 25 MG PO TABS
12.5000 mg | ORAL_TABLET | Freq: Every day | ORAL | 3 refills | Status: DC
Start: 2020-07-21 — End: 2020-12-14

## 2020-07-21 NOTE — H&P (View-Only) (Signed)
Date:  07/21/2020   ID:  Vincent Chandler, DOB 1961-01-31, MRN [id number]   Provider location: [facility] Type of Visit: Established patient  PCP:  Ann Held, DO  Cardiologist: Dr. [name]   History of Present Illness: Vincent Chandler is a 60 y.o. male who has a history of A flutter, chronic systolic heart failure, CAD s/p CABG, CKD Stage III, DMII, OSA .  Admitted 08/07/17 with atrial flutter/RVR and acute on chronic systolic heart failure. Underwent successful DC/CV on 2/8. Required short term milrinone but was able to wean off. HF meds started. Echo this admission with EF 15%. D/C weight 201 pounds.  CPX in 2/19 showed severe functional impairment due to heart failure. However, PFTs were restrictive and high resolution CT chest was concerning for interstitial lung disease.   He has had atrial flutter ablation.   He had RHC in 4/19 showing preserved cardiac output and low filling pressures, but moderate pulmonary hypertension.   He saw pulmonary regarding interstitial lung disease and had bronchoscopy.  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  He has been started on prednisone, cough is much improved on prednisone.    He saw Dr. Caryl Comes, not planning to upgrade ICD to CRT given IVCD but not LBBB-like.   He had a Barostim device placed in 1/20.   I tried him on [place] but he developed orthostasis after starting it and had to stop.   He has been on prednisone for suspected amiodarone pulmonary toxicity/pulmonary fibrosis.  He has had a chronic cough.   With worsened dyspnea, I took him for RHC in 6/20.  This showed normal filling pressures and low but not markedly low cardiac output. High resolution CT chest in 6/20 showed interstitial lung disease in UIP patttern. He saw pulmonary and prednisone was increased then tapered back down.   Echo in 3/21 showed EF 20-25%, mild RV dilation with moderately decreased systolic function.    He returns today for followup of CHF and CAD.  Using CPAP at night and oxygen most of the time during the day.  Weight is down 7 lbs.  He has been symptomatically worse for about 2-3 weeks.  He has noticed pressure across his chest and burning in his chest with minor activities like showering and walking across his house.  He has been short of breath with these same activities as well.  He worries that CAD has worsened as these symptoms are similar to his prior ischemic symptoms. Poor stamina.  Lightheadedness with standing, SBP 90 today.  No orthopnea/PND.    REDS clip 41%  ECG (personally reviewed): NSR, 1st degree AVB, old anterolateral MI, old inferior MI.   Labs (2/19): free T4 increased but free T3 normal, LDL 86, HDL 30 Labs (3/19): K 4.9, creatinine 1.53, hgb 14.3, LDL 52, ANA negative, RF negative Labs (4/19): K 4.2, creatinine 1.27 Labs (5/19): LDL 69, K 4.4, creatinine 1.36 Labs (8/19): K 5.3, creatinine 2.2 Labs (10/19): K 5, creatinine 1.49 Labs (12/19): hgb 13.4 Labs (2/20): K 4.2, creatinine 1.45, hgb 14.3 Labs (5/20): K 4.2, creatinine 1.81, digoxin 0.6, LDL 16, TGs 396, hgb 13.8 Labs (6/20): creatinine 2 Labs (8/20): LDL 27, Tgs 284, LFTs normal, K 4.5, creatinine 2.3.  Labs (10/20): K 4.4, creatinine 1.79, hgb 13.4 Labs (12/20): LDL 68, TGs 255 Labs (3/21): K 4.3, creatinine 2.19 Labs (6/21): K 4.4, creatinine 1.87 Labs (7/21): K 4.5, creatinine 2.2, digoxin  0.7 Labs (12/21): LDL 54, HDL 56, TGs 178 Labs (1/22): K 4.7 => 5.1, creatinine 1.77 => 2.05  PMH:  1. Atrial fibrillation: Noted post-op CABG.  2. Atrial flutter: Noted during 2/19 admission, DCCV done.  S/p Ablation 3/19.  3. CAD: s/p CABG.  - LHC (1/16):  Patent LIMA-LAD and SVG-D. 80% mLCx and 99% PLOM, total occlusion of SVG-PLOM.  Total occlusion of RCA with occlusion of SVG-RCA.  Patient had DES to mLCx-PLOM.  4. HTN 5. Type II diabetes  6. Hyperlipidemia 7. Chronic systolic CHF: Ischemic  cardiomyopathy.   - Echo (2/19): EF 15%, mildly dilated LV, mild LVH, inferolateral akinesis, milr MR, mildly dilated RV with severely reduced systolic function.  - CPX (2/19): Peak VO2 12.2, VE/VCO2 slope 53, RER 1.07 => severe limitation from heart failure.  - Medtronic ICD  - RHC (4/19): mean RA 5, PA 54/18 mean 31, mean PCWP 12, CI 2.5, PVR 3.6 WU - Echo (1/20): EF 20% - Barostimulator device placed in 1/20.  - RHC (6/20): mean RA 6, PA 42/17 mean 27, mean PCWP 12, CI 2.29, PVR 3.1  - Echo (3/21): EF 20-25%, mild RV dilation with moderately decreased systolic function 8. Interstitial lung disease: PFTs (2/19) were restrictive, raising concern for ILD.  - High resolution CT chest in 2/19: interstitial lung disease concerning for usual interstitial pneumonitis. - Bronchoscopy was done, possible amiodarone lung toxicity causing ILD, now on prednisone.  - High resolution chest CT (6/20): ILD in UIP pattern.   - Ofev 9. CKD stage 3 10. OSA: Home sleep study in 2020 with severe OSA.  11. ABIs (2/19): not significantly abnormal - ABIs (8/21): Normal 12. Carotid dopplers (2/19): Mild disease only.  - Carotid dopplers (1/20): Minimal stenosis.  13. Pulmonary hypertension: ?Group 3 due to ILD.  14. Nephrolithiasis 15. Gout   Current Outpatient Medications  Medication Sig Dispense Refill   ACCU-CHEK FASTCLIX LANCETS MISC Use as directed once a day.  Dx code: E11.9 100 each 1   albuterol (ACCUNEB) 0.63 MG/3ML nebulizer solution Take 3 mLs (0.63 mg total) by nebulization every 6 (six) hours as needed for wheezing. 75 mL 12   albuterol (VENTOLIN HFA) 108 (90 Base) MCG/ACT inhaler Inhale 2 puffs into the lungs every 6 (six) hours as needed for wheezing or shortness of breath.      atorvastatin (LIPITOR) 40 MG tablet TAKE 1 TABLET BY MOUTH AT BEDTIME. 30 tablet 3   blood glucose meter kit and supplies Dispense based on patient and insurance preference. Use as directed once a day. Dx Code  E11.9. 1 each 0   Blood Glucose Monitoring Suppl (ACCU-CHEK GUIDE) w/Device KIT 1 each by Does not apply route daily. DX Code: E11.9 1 kit 0   carvedilol (COREG) 6.25 MG tablet Take 1 tablet (6.25 mg total) by mouth 2 (two) times daily with a meal. 60 tablet 6   colchicine 0.6 MG tablet Take 1 tablet (0.6 mg total) by mouth 2 (two) times daily. 60 tablet 2   Continuous Blood Gluc Sensor (FREESTYLE LIBRE 2 SENSOR) MISC 1 Device by Does not apply route as directed. 2 each 11   digoxin (LANOXIN) 0.125 MG tablet TAKE 1/2 TABLET BY MOUTH DAILY. 45 tablet 3   ELIQUIS 5 MG TABS tablet TAKE 1 TABLET BY MOUTH 2 TIMES DAILY. 180 tablet 3   ENTRESTO 24-26 MG TAKE 1 TABLET BY MOUTH 2 (TWO) TIMES DAILY. 60 tablet 11   furosemide (LASIX) 40 MG tablet Take 1 tablet (  40 mg total) by mouth daily. 30 tablet 6   glucose blood (ACCU-CHEK GUIDE) test strip Use as directed once a day.  Dx code: E11.9 100 each 1   guaiFENesin (MUCINEX) 600 MG 12 hr tablet Take 1 tablet (600 mg total) by mouth 2 (two) times daily as needed for to loosen phlegm.     icosapent Ethyl (VASCEPA) 1 g capsule Take 2 capsules (2 g total) by mouth 2 (two) times daily. 120 capsule 6   metFORMIN (GLUCOPHAGE) 500 MG tablet Take 1 tablet (500 mg total) by mouth 2 (two) times daily with a meal. 180 tablet 3   mometasone-formoterol (DULERA) 200-5 MCG/ACT AERO Inhale 2 puffs into the lungs 2 (two) times a day. 2 Inhaler 0   Nintedanib (OFEV) 150 MG CAPS Take 150 mg by mouth daily.     OXYGEN Inhale into the lungs. 3L prn      predniSONE (DELTASONE) 10 MG tablet Take 1 tablet (10 mg total) by mouth daily with breakfast. 30 tablet 1   nitroGLYCERIN (NITROSTAT) 0.4 MG SL tablet Place 1 tablet (0.4 mg total) under the tongue every 5 (five) minutes as needed for chest pain. 25 tablet 3   spironolactone (ALDACTONE) 25 MG tablet Take 0.5 tablets (12.5 mg total) by mouth daily. 45 tablet 3   No current facility-administered medications for  this encounter.    Allergies:   Patient has no known allergies.   Social History:  The patient  reports that he has never smoked. His smokeless tobacco use includes chew. He reports current alcohol use of about 3.0 standard drinks of alcohol per week. He reports that he does not use drugs.   Family History:  The patient's family history includes Diabetes in his father and mother; Hyperlipidemia in his father; Hypertension in his father and mother.   ROS:  Please see the history of present illness.   All other systems are personally reviewed and negative.   Exam:    BP (!) 90/58    Pulse 79    Wt 71.4 kg (157 lb 6.4 oz)    SpO2 100% Comment: 3 l n/c   BMI 23.24 kg/m  General: NAD Neck: No JVD, no thyromegaly or thyroid nodule.  Lungs: Dry crackles at bases.  CV: Nondisplaced PMI.  Heart regular S1/S2, no S3/S4, no murmur.  No peripheral edema.  No carotid bruit.  Normal pedal pulses.  Abdomen: Soft, nontender, no hepatosplenomegaly, no distention.  Skin: Intact without lesions or rashes.  Neurologic: Alert and oriented x 3.  Psych: Normal affect. Extremities: No clubbing or cyanosis.  HEENT: Normal.   Recent Labs: 04/27/2020: B Natriuretic Peptide 116.6 07/07/2020: ALT 11 07/21/2020: BUN 68; Creatinine, Ser 2.05; Hemoglobin 14.4; Platelets 183; Potassium 5.1; Sodium 140  Personally reviewed   Wt Readings from Last 3 Encounters:  07/21/20 71.4 kg (157 lb 6.4 oz)  07/06/20 74.8 kg (165 lb)  06/14/20 74.8 kg (164 lb 12.8 oz)      ASSESSMENT AND PLAN:  1. Chronic systolic CHF: Ischemic cardiomyopathy. Medtronic ICD single chamber. 2/19 admission for decompensated HF requiring milrinone. Echo 2/7/019 EF 15% Mildly dilated RV. CPX in 2/19 was suggestive of severe limitation due to HF.   RHC in 4/19 showed normal filling pressures and preserved cardiac output.  Echo in 1/20 with EF 20%.  He had a Barostimulator device placed in 1/20.  RHC in 6/20 showed normal filling pressures, CI 2.29.   Echo 3/21 with EF 20-25%.  Symptomatically worse today,  NYHA class IIIb.  On exam, he does not look volume overloaded and weight is down.  However, REDS clip elevated at 41% (could this be inaccurate due to ILD?).  Poor stamina, fatigue, and soft BP.  I am concerned that his symptoms are due to low output HF.  On the other hand, symptoms may be due to ischemia/CAD.   - With soft BP, orthostatic symptoms, and creatinine up to 2.05, I am going to decrease spironolactone to 12.5 mg daily and Coreg to 3.125 mg bid to promote renal perfusion and higher BP.  - Continue digoxin 0.0625 mg daily, level ok today.    - Continue Entresto 24/26 bid.      - Continue Lasix 40 mg daily for now with elevated REDS clip but as above, I am not convinced that he is volume overloaded.  I will have him hold Lasix the day before and the day of his cath.  - Plan for RHC to assess filling pressure and cardiac output, concerned for low output HF.  Discussed risks/benefits with patient and he agrees to procedure.  - Has IVCD, not LBBB-like.  Saw Dr. Caryl Comes, will not upgrade ICD to CRT.  - With significant ILD requiring oxygen, I do not think that he would be an LVAD candidate.   2. Afib/ Atrial flutter: Paroxysmal.  Admitted in 2/19 with severe HF decompensation in setting of atrial flutter with RVR.  He was cardioverted.  He then had atrial flutter ablation in 3/19. He is off amiodarone due to question of amiodarone lung toxicity. No palpitations.  NSR today.  - He is now off amiodarone (question of lung toxicity, would not re-challenge in future).   - Continue Eliquis 5 mg bid. Denies bleeding. Hold the day before and day of cath.  3. CAD: S/p CABG.  Last intervention was PCI to LCx in 2016.  He is having exertional chest pressure/burning with mild exertion like showering or walking across the house.  This has been present for 2-3 weeks.  Symptoms are reminiscent of prior ischemic symptoms. He is concerned that he has "a new  blockage."  I agree that his symptoms seem like progressive angina.  - I will arrange for coronary angiography next week. Will need to minimize contrast with CKD 3.  Creatinine 2.05 today. I am going to cut back on spironolactone and Coreg with soft BP to promote higher BP/renal perfusion.  I will also hold Lasix the day before and the day of procedure.  He will need a stat BMET the morning of cath. We discussed risks/benefits and he agrees to procedure.  - Continue atorvastatin, good lipids in 12/21.   - Triglycerides improved on Vascepa 2 g bid.  - No ASA with Eliquis use.  4. Pulmonary fibrosis: PFTs in 2/19 were restrictive, and high resolution CT showed interstitial lung disease. Amiodarone was stopped due to concern for possible toxicity.  He has seen [name] => bronchoscopy suggestive of ILD related to amiodarone.  He has been on chronic prednisone.  High resolution CT chest repeated in 6/20, again showing ILD, described at UIP pattern. He has started Ofev.  He needs to wear oxygen at all times.  - Continue Ofev.    - He is on prednisone 10 mg daily.  - Continue pulmonary followup.  5. OSA: Continue CPAP.    6. CKD: Stage 3.  - Followup with nephrology.  - Will take steps as above to minimize renal risk from coronary angiography.  7. Pulmonary  hypertension: Suspect group 3 PH due to lung disease (ILD).  8. DM2: He did not tolerate Farxiga   Followup 2 wks after RHC/LHC.   Signed, Loralie Champagne, MD  07/21/2020  [facility] [address] [facility] [place] [address] [phone] (office) [phone] (fax)

## 2020-07-21 NOTE — Progress Notes (Signed)
ReDS Vest / Clip - 07/21/20 1200      ReDS Vest / Clip   Station Marker C    Ruler Value 33    ReDS Value Range High volume overload    ReDS Actual Value 41

## 2020-07-21 NOTE — Progress Notes (Signed)
°  ° °Date:  07/21/2020  ° °ID:  Vincent Chandler, DOB 08/22/1960, MRN [id number]   °Provider location: [facility] °Type of Visit: Established patient ° °PCP:  Lowne Chase, Yvonne R, DO  °Cardiologist: Dr. Wynelle Dreier °  °History of Present Illness: °Vincent Chandler is a 59 y.o. male who has a history of A flutter, chronic systolic heart failure, CAD s/p CABG, CKD Stage III, DMII, OSA . °  °Admitted 08/07/17 with atrial flutter/RVR and acute on chronic systolic heart failure. Underwent successful DC/CV on 2/8. Required short term milrinone but was able to wean off. HF meds started. Echo this admission with EF 15%. D/C weight 201 pounds. °  °CPX in 2/19 showed severe functional impairment due to heart failure. However, PFTs were restrictive and high resolution CT chest was concerning for interstitial lung disease.  °  °He has had atrial flutter ablation.  °  °He had RHC in 4/19 showing preserved cardiac output and low filling pressures, but moderate pulmonary hypertension.  °  °He saw pulmonary regarding interstitial lung disease and had bronchoscopy.  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  He has been started on prednisone, cough is much improved on prednisone.   °  °He saw Dr. Klein, not planning to upgrade ICD to CRT given IVCD but not LBBB-like.  ° °He had a Barostim device placed in 1/20.  °  °I tried him on Farxiga but he developed orthostasis after starting it and had to stop.  ° °He has been on prednisone for suspected amiodarone pulmonary toxicity/pulmonary fibrosis.  He has had a chronic cough.  ° °With worsened dyspnea, I took him for RHC in 6/20.  This showed normal filling pressures and low but not markedly low cardiac output. High resolution CT chest in 6/20 showed interstitial lung disease in UIP patttern. He saw pulmonary and prednisone was increased then tapered back down.  ° °Echo in 3/21 showed EF 20-25%, mild RV dilation with moderately decreased systolic function.   ° °He returns today for followup of CHF and CAD.  Using CPAP at night and oxygen most of the time during the day.  Weight is down 7 lbs.  He has been symptomatically worse for about 2-3 weeks.  He has noticed pressure across his chest and burning in his chest with minor activities like showering and walking across his house.  He has been short of breath with these same activities as well.  He worries that CAD has worsened as these symptoms are similar to his prior ischemic symptoms. Poor stamina.  Lightheadedness with standing, SBP 90 today.  No orthopnea/PND.   ° °REDS clip 41% ° °ECG (personally reviewed): NSR, 1st degree AVB, old anterolateral MI, old inferior MI.  ° °Labs (2/19): free T4 increased but free T3 normal, LDL 86, HDL 30 °Labs (3/19): K 4.9, creatinine 1.53, hgb 14.3, LDL 52, ANA negative, RF negative °Labs (4/19): K 4.2, creatinine 1.27 °Labs (5/19): LDL 69, K 4.4, creatinine 1.36 °Labs (8/19): K 5.3, creatinine 2.2 °Labs (10/19): K 5, creatinine 1.49 °Labs (12/19): hgb 13.4 °Labs (2/20): K 4.2, creatinine 1.45, hgb 14.3 °Labs (5/20): K 4.2, creatinine 1.81, digoxin 0.6, LDL 16, TGs 396, hgb 13.8 °Labs (6/20): creatinine 2 °Labs (8/20): LDL 27, Tgs 284, LFTs normal, K 4.5, creatinine 2.3.  °Labs (10/20): K 4.4, creatinine 1.79, hgb 13.4 °Labs (12/20): LDL 68, TGs 255 °Labs (3/21): K 4.3, creatinine 2.19 °Labs (6/21): K 4.4, creatinine 1.87 °Labs (7/21): K 4.5, creatinine 2.2, digoxin   0.7 °Labs (12/21): LDL 54, HDL 56, TGs 178 °Labs (1/22): K 4.7 => 5.1, creatinine 1.77 => 2.05 °  °PMH:  °1. Atrial fibrillation: Noted post-op CABG.  °2. Atrial flutter: Noted during 2/19 admission, DCCV done.  S/p Ablation 3/19.  °3. CAD: s/p CABG.  °- LHC (1/16):  Patent LIMA-LAD and SVG-D. 80% mLCx and 99% PLOM, total occlusion of SVG-PLOM.  Total occlusion of RCA with occlusion of SVG-RCA.  Patient had DES to mLCx-PLOM.  °4. HTN °5. Type II diabetes  °6. Hyperlipidemia °7. Chronic systolic CHF: Ischemic  cardiomyopathy.   °- Echo (2/19): EF 15%, mildly dilated LV, mild LVH, inferolateral akinesis, milr MR, mildly dilated RV with severely reduced systolic function.  °- CPX (2/19): Peak VO2 12.2, VE/VCO2 slope 53, RER 1.07 => severe limitation from heart failure.  °- Medtronic ICD  °- RHC (4/19): mean RA 5, PA 54/18 mean 31, mean PCWP 12, CI 2.5, PVR 3.6 WU °- Echo (1/20): EF 20% °- Barostimulator device placed in 1/20.  °- RHC (6/20): mean RA 6, PA 42/17 mean 27, mean PCWP 12, CI 2.29, PVR 3.1  °- Echo (3/21): EF 20-25%, mild RV dilation with moderately decreased systolic function °8. Interstitial lung disease: PFTs (2/19) were restrictive, raising concern for ILD.  °- High resolution CT chest in 2/19: interstitial lung disease concerning for usual interstitial pneumonitis. °- Bronchoscopy was done, possible amiodarone lung toxicity causing ILD, now on prednisone.  °- High resolution chest CT (6/20): ILD in UIP pattern.   °- Ofev °9. CKD stage 3 °10. OSA: Home sleep study in 2020 with severe OSA.  °11. ABIs (2/19): not significantly abnormal °- ABIs (8/21): Normal °12. Carotid dopplers (2/19): Mild disease only.  °- Carotid dopplers (1/20): Minimal stenosis.  °13. Pulmonary hypertension: ?Group 3 due to ILD.  °14. Nephrolithiasis °15. Gout ° ° °Current Outpatient Medications  °Medication Sig Dispense Refill  °• ACCU-CHEK FASTCLIX LANCETS MISC Use as directed once a day.  Dx code: E11.9 100 each 1  °• albuterol (ACCUNEB) 0.63 MG/3ML nebulizer solution Take 3 mLs (0.63 mg total) by nebulization every 6 (six) hours as needed for wheezing. 75 mL 12  °• albuterol (VENTOLIN HFA) 108 (90 Base) MCG/ACT inhaler Inhale 2 puffs into the lungs every 6 (six) hours as needed for wheezing or shortness of breath.     °• atorvastatin (LIPITOR) 40 MG tablet TAKE 1 TABLET BY MOUTH AT BEDTIME. 30 tablet 3  °• blood glucose meter kit and supplies Dispense based on patient and insurance preference. Use as directed once a day. Dx Code  E11.9. 1 each 0  °• Blood Glucose Monitoring Suppl (ACCU-CHEK GUIDE) w/Device KIT 1 each by Does not apply route daily. DX Code: E11.9 1 kit 0  °• carvedilol (COREG) 6.25 MG tablet Take 1 tablet (6.25 mg total) by mouth 2 (two) times daily with a meal. 60 tablet 6  °• colchicine 0.6 MG tablet Take 1 tablet (0.6 mg total) by mouth 2 (two) times daily. 60 tablet 2  °• Continuous Blood Gluc Sensor (FREESTYLE LIBRE 2 SENSOR) MISC 1 Device by Does not apply route as directed. 2 each 11  °• digoxin (LANOXIN) 0.125 MG tablet TAKE 1/2 TABLET BY MOUTH DAILY. 45 tablet 3  °• ELIQUIS 5 MG TABS tablet TAKE 1 TABLET BY MOUTH 2 TIMES DAILY. 180 tablet 3  °• ENTRESTO 24-26 MG TAKE 1 TABLET BY MOUTH 2 (TWO) TIMES DAILY. 60 tablet 11  °• furosemide (LASIX) 40 MG tablet Take 1 tablet (  40 mg total) by mouth daily. 30 tablet 6  . glucose blood (ACCU-CHEK GUIDE) test strip Use as directed once a day.  Dx code: E11.9 100 each 1  . guaiFENesin (MUCINEX) 600 MG 12 hr tablet Take 1 tablet (600 mg total) by mouth 2 (two) times daily as needed for to loosen phlegm.    Marland Kitchen icosapent Ethyl (VASCEPA) 1 g capsule Take 2 capsules (2 g total) by mouth 2 (two) times daily. 120 capsule 6  . metFORMIN (GLUCOPHAGE) 500 MG tablet Take 1 tablet (500 mg total) by mouth 2 (two) times daily with a meal. 180 tablet 3  . mometasone-formoterol (DULERA) 200-5 MCG/ACT AERO Inhale 2 puffs into the lungs 2 (two) times a day. 2 Inhaler 0  . Nintedanib (OFEV) 150 MG CAPS Take 150 mg by mouth daily.    . OXYGEN Inhale into the lungs. 3L prn     . predniSONE (DELTASONE) 10 MG tablet Take 1 tablet (10 mg total) by mouth daily with breakfast. 30 tablet 1  . nitroGLYCERIN (NITROSTAT) 0.4 MG SL tablet Place 1 tablet (0.4 mg total) under the tongue every 5 (five) minutes as needed for chest pain. 25 tablet 3  . spironolactone (ALDACTONE) 25 MG tablet Take 0.5 tablets (12.5 mg total) by mouth daily. 45 tablet 3   No current facility-administered medications for  this encounter.    Allergies:   Patient has no known allergies.   Social History:  The patient  reports that he has never smoked. His smokeless tobacco use includes chew. He reports current alcohol use of about 3.0 standard drinks of alcohol per week. He reports that he does not use drugs.   Family History:  The patient's family history includes Diabetes in his father and mother; Hyperlipidemia in his father; Hypertension in his father and mother.   ROS:  Please see the history of present illness.   All other systems are personally reviewed and negative.   Exam:    BP (!) 90/58   Pulse 79   Wt 71.4 kg (157 lb 6.4 oz)   SpO2 100% Comment: 3 l n/c  BMI 23.24 kg/m  General: NAD Neck: No JVD, no thyromegaly or thyroid nodule.  Lungs: Dry crackles at bases.  CV: Nondisplaced PMI.  Heart regular S1/S2, no S3/S4, no murmur.  No peripheral edema.  No carotid bruit.  Normal pedal pulses.  Abdomen: Soft, nontender, no hepatosplenomegaly, no distention.  Skin: Intact without lesions or rashes.  Neurologic: Alert and oriented x 3.  Psych: Normal affect. Extremities: No clubbing or cyanosis.  HEENT: Normal.   Recent Labs: 04/27/2020: B Natriuretic Peptide 116.6 07/07/2020: ALT 11 07/21/2020: BUN 68; Creatinine, Ser 2.05; Hemoglobin 14.4; Platelets 183; Potassium 5.1; Sodium 140  Personally reviewed   Wt Readings from Last 3 Encounters:  07/21/20 71.4 kg (157 lb 6.4 oz)  07/06/20 74.8 kg (165 lb)  06/14/20 74.8 kg (164 lb 12.8 oz)      ASSESSMENT AND PLAN:  1. Chronic systolic CHF: Ischemic cardiomyopathy. Medtronic ICD single chamber. 2/19 admission for decompensated HF requiring milrinone. Echo 2/7/019 EF 15% Mildly dilated RV. CPX in 2/19 was suggestive of severe limitation due to HF.   RHC in 4/19 showed normal filling pressures and preserved cardiac output.  Echo in 1/20 with EF 20%.  He had a Barostimulator device placed in 1/20.  RHC in 6/20 showed normal filling pressures, CI 2.29.   Echo 3/21 with EF 20-25%.  Symptomatically worse today, NYHA class IIIb.  On exam, he does not look volume overloaded and weight is down.  However, REDS clip elevated at 41% (could this be inaccurate due to ILD?).  Poor stamina, fatigue, and soft BP.  I am concerned that his symptoms are due to low output HF.  On the other hand, symptoms may be due to ischemia/CAD.   - With soft BP, orthostatic symptoms, and creatinine up to 2.05, I am going to decrease spironolactone to 12.5 mg daily and Coreg to 3.125 mg bid to promote renal perfusion and higher BP.  - Continue digoxin 0.0625 mg daily, level ok today.    - Continue Entresto 24/26 bid.      - Continue Lasix 40 mg daily for now with elevated REDS clip but as above, I am not convinced that he is volume overloaded.  I will have him hold Lasix the day before and the day of his cath.  - Plan for RHC to assess filling pressure and cardiac output, concerned for low output HF.  Discussed risks/benefits with patient and he agrees to procedure.  - Has IVCD, not LBBB-like.  Saw Dr. Caryl Comes, will not upgrade ICD to CRT.  - With significant ILD requiring oxygen, I do not think that he would be an LVAD candidate.   2. Afib/ Atrial flutter: Paroxysmal.  Admitted in 2/19 with severe HF decompensation in setting of atrial flutter with RVR.  He was cardioverted.  He then had atrial flutter ablation in 3/19. He is off amiodarone due to question of amiodarone lung toxicity. No palpitations.  NSR today.  - He is now off amiodarone (question of lung toxicity, would not re-challenge in future).   - Continue Eliquis 5 mg bid. Denies bleeding. Hold the day before and day of cath.  3. CAD: S/p CABG.  Last intervention was PCI to LCx in 2016.  He is having exertional chest pressure/burning with mild exertion like showering or walking across the house.  This has been present for 2-3 weeks.  Symptoms are reminiscent of prior ischemic symptoms. He is concerned that he has "a new  blockage."  I agree that his symptoms seem like progressive angina.  - I will arrange for coronary angiography next week. Will need to minimize contrast with CKD 3.  Creatinine 2.05 today. I am going to cut back on spironolactone and Coreg with soft BP to promote higher BP/renal perfusion.  I will also hold Lasix the day before and the day of procedure.  He will need a stat BMET the morning of cath. We discussed risks/benefits and he agrees to procedure.  - Continue atorvastatin, good lipids in 12/21.   - Triglycerides improved on Vascepa 2 g bid.  - No ASA with Eliquis use.  4. Pulmonary fibrosis: PFTs in 2/19 were restrictive, and high resolution CT showed interstitial lung disease. Amiodarone was stopped due to concern for possible toxicity.  He has seen [name] => bronchoscopy suggestive of ILD related to amiodarone.  He has been on chronic prednisone.  High resolution CT chest repeated in 6/20, again showing ILD, described at UIP pattern. He has started Ofev.  He needs to wear oxygen at all times.  - Continue Ofev.    - He is on prednisone 10 mg daily.  - Continue pulmonary followup.  5. OSA: Continue CPAP.    6. CKD: Stage 3.  - Followup with nephrology.  - Will take steps as above to minimize renal risk from coronary angiography.  7. Pulmonary hypertension: Suspect group 3  hypertension: Suspect group 3 PH due to lung disease (ILD).  °8. DM2: He did not tolerate Farxiga  ° °Followup 2 wks after RHC/LHC.  °  °Signed, °Armilda Vanderlinden, MD  °07/21/2020 °  °[facility] °[place] °[address] °[facility] °[place] [address] °[phone] (office) °[phone] (fax) °

## 2020-07-21 NOTE — Patient Instructions (Addendum)
DECREASE Spironolactone 12.42m (1/2 tablets) daily  Labs done today, your results will be available in MyChart, we will contact you for abnormal readings.  Your physician recommends that you schedule a follow-up appointment in: 2 weeks  If you have any questions or concerns before your next appointment please send [place] message through [place] call our office at [phone]    TO LEAVE A MESSAGE FOR THE NURSE SELECT OPTION 2, PLEASE LEAVE A MESSAGE INCLUDING: . YOUR NAME . DATE OF BIRTH . CALL BACK NUMBER . REASON FOR CALL**this is important as we prioritize the call backs  YLelyAS LONG AS YOU CALL BEFORE 4:00 PM     HEART CATHETERIZATION INSTRUCTIONS  You are scheduled for a Cardiac Catheterization on Wednesday, January 27 with Dr. DLoralie Champagne  1. [facility] [address] [place] [address]at 10 am (This time is two hours before your procedure to ensure your preparation). Free valet parking service is available.   Special note: Every effort is made to have your procedure done on time. Please understand that emergencies sometimes delay scheduled procedures.  2. Diet: Do not eat solid foods after midnight.  The patient may have clear liquids until 5am upon the day of the procedure.  3. COVID TEST 1/24 at 2:25pm  [address] [address]251 4. Medication instructions in preparation for your procedure:  DO NOT TAKE LASIX, SPIRONOLACTONE 1/26 AM  DO NOT TAKE METFORMIN 1/26-1/28  DO NOT TAKE ELIQUIS 1/25 AND 1/26  On the morning of your procedure, take any morning medicines NOT listed above.  You may use sips of water.  5. Plan for one night stay--bring personal belongings. 6. Bring a current list of your medications and current insurance cards. 7. You MUST have a responsible person to drive you home. 8. Someone MUST be with you the first 24  hours after you arrive home or your discharge will be delayed. 9. Please wear clothes that are easy to get on and off and wear slip-on shoes.  Thank you for allowing [facility] care for you!   -- [facility]

## 2020-07-26 ENCOUNTER — Other Ambulatory Visit (HOSPITAL_COMMUNITY)
Admission: RE | Admit: 2020-07-26 | Discharge: 2020-07-26 | Disposition: A | Discharge status: Home / Self Care | Payer: Medicare HMO | Source: Ambulatory Visit | Attending: Cardiology | Admitting: Cardiology

## 2020-07-26 DIAGNOSIS — Z20822 Contact with and (suspected) exposure to covid-19: Secondary | ICD-10-CM | POA: Diagnosis not present

## 2020-07-26 DIAGNOSIS — Z01812 Encounter for preprocedural laboratory examination: Secondary | ICD-10-CM | POA: Diagnosis not present

## 2020-07-26 LAB — SARS CORONAVIRUS 2 (TAT 6-24 HRS): SARS Coronavirus 2: NEGATIVE

## 2020-07-27 ENCOUNTER — Telehealth (HOSPITAL_COMMUNITY): Payer: Self-pay

## 2020-07-27 MED ORDER — CARVEDILOL 3.125 MG PO TABS: 3.1250 mg | ORAL_TABLET | Freq: Two times a day (BID) | ORAL | 3 refills | Status: AC

## 2020-07-27 NOTE — Telephone Encounter (Signed)
Patient advised and verbalized understanding. New Rx sent into patients pharmacy.

## 2020-07-27 NOTE — Telephone Encounter (Signed)
-----  Message from Larey Dresser, MD sent at 07/21/2020  9:54 PM EST ----- Creatinine higher. Plan for cath next week.  Would have him decrease spironolactone 12.5 mg daily as I told him today but also decrease Coreg to 3.125 mg bid.  Will try to get BP up for better renal perfusion. He should get stat BMET on the day of the cath.  Do not take Lasix the day before cath or the day of cath.

## 2020-07-29 ENCOUNTER — Encounter (HOSPITAL_COMMUNITY)
Admission: RE | Disposition: A | Discharge status: Home / Self Care | Payer: Self-pay | Source: Home / Self Care | Attending: Cardiology

## 2020-07-29 ENCOUNTER — Ambulatory Visit (HOSPITAL_COMMUNITY)
Admission: RE | Admit: 2020-07-29 | Discharge: 2020-07-29 | Disposition: A | Discharge status: Home / Self Care | Payer: Medicare HMO | Attending: Cardiology | Admitting: Cardiology

## 2020-07-29 ENCOUNTER — Other Ambulatory Visit: Payer: Self-pay

## 2020-07-29 DIAGNOSIS — Z951 Presence of aortocoronary bypass graft: Secondary | ICD-10-CM | POA: Diagnosis not present

## 2020-07-29 DIAGNOSIS — I4892 Unspecified atrial flutter: Secondary | ICD-10-CM | POA: Insufficient documentation

## 2020-07-29 DIAGNOSIS — Z7952 Long term (current) use of systemic steroids: Secondary | ICD-10-CM | POA: Diagnosis not present

## 2020-07-29 DIAGNOSIS — I5022 Chronic systolic (congestive) heart failure: Secondary | ICD-10-CM | POA: Diagnosis not present

## 2020-07-29 DIAGNOSIS — Z7901 Long term (current) use of anticoagulants: Secondary | ICD-10-CM | POA: Insufficient documentation

## 2020-07-29 DIAGNOSIS — I2721 Secondary pulmonary arterial hypertension: Secondary | ICD-10-CM | POA: Diagnosis not present

## 2020-07-29 DIAGNOSIS — I480 Paroxysmal atrial fibrillation: Secondary | ICD-10-CM | POA: Insufficient documentation

## 2020-07-29 DIAGNOSIS — Z7984 Long term (current) use of oral hypoglycemic drugs: Secondary | ICD-10-CM | POA: Insufficient documentation

## 2020-07-29 DIAGNOSIS — E1122 Type 2 diabetes mellitus with diabetic chronic kidney disease: Secondary | ICD-10-CM | POA: Insufficient documentation

## 2020-07-29 DIAGNOSIS — G4733 Obstructive sleep apnea (adult) (pediatric): Secondary | ICD-10-CM | POA: Diagnosis not present

## 2020-07-29 DIAGNOSIS — I25810 Atherosclerosis of coronary artery bypass graft(s) without angina pectoris: Secondary | ICD-10-CM | POA: Diagnosis not present

## 2020-07-29 DIAGNOSIS — I130 Hypertensive heart and chronic kidney disease with heart failure and stage 1 through stage 4 chronic kidney disease, or unspecified chronic kidney disease: Secondary | ICD-10-CM | POA: Insufficient documentation

## 2020-07-29 DIAGNOSIS — Z79899 Other long term (current) drug therapy: Secondary | ICD-10-CM | POA: Insufficient documentation

## 2020-07-29 DIAGNOSIS — Z95810 Presence of automatic (implantable) cardiac defibrillator: Secondary | ICD-10-CM | POA: Insufficient documentation

## 2020-07-29 DIAGNOSIS — Z7951 Long term (current) use of inhaled steroids: Secondary | ICD-10-CM | POA: Insufficient documentation

## 2020-07-29 DIAGNOSIS — I255 Ischemic cardiomyopathy: Secondary | ICD-10-CM | POA: Diagnosis not present

## 2020-07-29 DIAGNOSIS — J8410 Pulmonary fibrosis, unspecified: Secondary | ICD-10-CM | POA: Insufficient documentation

## 2020-07-29 DIAGNOSIS — N183 Chronic kidney disease, stage 3 unspecified: Secondary | ICD-10-CM | POA: Diagnosis not present

## 2020-07-29 DIAGNOSIS — I2510 Atherosclerotic heart disease of native coronary artery without angina pectoris: Secondary | ICD-10-CM | POA: Diagnosis not present

## 2020-07-29 DIAGNOSIS — I509 Heart failure, unspecified: Secondary | ICD-10-CM | POA: Diagnosis not present

## 2020-07-29 HISTORY — PX: RIGHT/LEFT HEART CATH AND CORONARY/GRAFT ANGIOGRAPHY: CATH118267

## 2020-07-29 LAB — POCT I-STAT EG7
Acid-base deficit: 3 mmol/L — ABNORMAL HIGH (ref 0.0–2.0)
Acid-base deficit: 4 mmol/L — ABNORMAL HIGH (ref 0.0–2.0)
Bicarbonate: 22.5 mmol/L (ref 20.0–28.0)
Bicarbonate: 23.2 mmol/L (ref 20.0–28.0)
Calcium, Ion: 1.2 mmol/L (ref 1.15–1.40)
Calcium, Ion: 1.31 mmol/L (ref 1.15–1.40)
HCT: 33 % — ABNORMAL LOW (ref 39.0–52.0)
HCT: 34 % — ABNORMAL LOW (ref 39.0–52.0)
Hemoglobin: 11.2 g/dL — ABNORMAL LOW (ref 13.0–17.0)
Hemoglobin: 11.6 g/dL — ABNORMAL LOW (ref 13.0–17.0)
O2 Saturation: 71 %
O2 Saturation: 71 %
Potassium: 4 mmol/L (ref 3.5–5.1)
Potassium: 4.2 mmol/L (ref 3.5–5.1)
Sodium: 143 mmol/L (ref 135–145)
Sodium: 144 mmol/L (ref 135–145)
TCO2: 24 mmol/L (ref 22–32)
TCO2: 25 mmol/L (ref 22–32)
pCO2, Ven: 45.6 mmHg (ref 44.0–60.0)
pCO2, Ven: 47.1 mmHg (ref 44.0–60.0)
pH, Ven: 7.3 (ref 7.250–7.430)
pH, Ven: 7.302 (ref 7.250–7.430)
pO2, Ven: 41 mmHg (ref 32.0–45.0)
pO2, Ven: 42 mmHg (ref 32.0–45.0)

## 2020-07-29 LAB — BASIC METABOLIC PANEL
Anion gap: 8 (ref 5–15)
BUN: 30 mg/dL — ABNORMAL HIGH (ref 6–20)
CO2: 23 mmol/L (ref 22–32)
Calcium: 8.8 mg/dL — ABNORMAL LOW (ref 8.9–10.3)
Chloride: 110 mmol/L (ref 98–111)
Creatinine, Ser: 1.4 mg/dL — ABNORMAL HIGH (ref 0.61–1.24)
GFR, Estimated: 58 mL/min — ABNORMAL LOW (ref >60)
Glucose, Bld: 99 mg/dL (ref 70–99)
Potassium: 4.9 mmol/L (ref 3.5–5.1)
Sodium: 141 mmol/L (ref 135–145)

## 2020-07-29 LAB — GLUCOSE, CAPILLARY
Glucose-Capillary: 67 mg/dL — ABNORMAL LOW (ref 70–99)
Glucose-Capillary: 113 mg/dL — ABNORMAL HIGH (ref 70–99)
Glucose-Capillary: 88 mg/dL (ref 70–99)
Glucose-Capillary: 96 mg/dL (ref 70–99)

## 2020-07-29 SURGERY — RIGHT/LEFT HEART CATH AND CORONARY/GRAFT ANGIOGRAPHY
Anesthesia: LOCAL

## 2020-07-29 MED ORDER — ASPIRIN 81 MG PO CHEW
CHEWABLE_TABLET | ORAL | Status: AC
  Administered 2020-07-29: 81 mg via ORAL
  Filled 2020-07-29: qty 1

## 2020-07-29 MED ORDER — LIDOCAINE HCL (PF) 1 % IJ SOLN
INTRAMUSCULAR | Status: AC
  Filled 2020-07-29: qty 30

## 2020-07-29 MED ORDER — MIDAZOLAM HCL 2 MG/2ML IJ SOLN
INTRAMUSCULAR | Status: AC
  Filled 2020-07-29: qty 2

## 2020-07-29 MED ORDER — IOHEXOL 350 MG/ML SOLN
INTRAVENOUS | Status: AC
  Filled 2020-07-29: qty 1

## 2020-07-29 MED ORDER — ASPIRIN 81 MG PO CHEW: 81.0000 mg | CHEWABLE_TABLET | ORAL | Status: AC

## 2020-07-29 MED ORDER — DEXTROSE 50 % IV SOLN
INTRAVENOUS | Status: DC | PRN
Start: 2020-07-29 — End: 2020-07-29
  Administered 2020-07-29: 25 mL via INTRAVENOUS

## 2020-07-29 MED ORDER — IOHEXOL 350 MG/ML SOLN
INTRAVENOUS | Status: DC | PRN
  Administered 2020-07-29: 70 mL via INTRA_ARTERIAL

## 2020-07-29 MED ORDER — SODIUM CHLORIDE 0.9% FLUSH: 3.0000 mL | INTRAVENOUS | Status: DC | PRN

## 2020-07-29 MED ORDER — LIDOCAINE HCL (PF) 1 % IJ SOLN
INTRAMUSCULAR | Status: DC | PRN
  Administered 2020-07-29 (×2): 2 mL

## 2020-07-29 MED ORDER — HEPARIN SODIUM (PORCINE) 1000 UNIT/ML IJ SOLN
INTRAMUSCULAR | Status: DC | PRN
  Administered 2020-07-29: 4000 [IU] via INTRAVENOUS

## 2020-07-29 MED ORDER — VERAPAMIL HCL 2.5 MG/ML IV SOLN
INTRAVENOUS | Status: AC
  Filled 2020-07-29: qty 2

## 2020-07-29 MED ORDER — SODIUM CHLORIDE 0.9 % IV SOLN: INTRAVENOUS | Status: DC

## 2020-07-29 MED ORDER — MIDAZOLAM HCL 2 MG/2ML IJ SOLN
INTRAMUSCULAR | Status: DC | PRN
  Administered 2020-07-29: 1 mg via INTRAVENOUS

## 2020-07-29 MED ORDER — FENTANYL CITRATE (PF) 100 MCG/2ML IJ SOLN
INTRAMUSCULAR | Status: AC
  Filled 2020-07-29: qty 2

## 2020-07-29 MED ORDER — HEPARIN SODIUM (PORCINE) 1000 UNIT/ML IJ SOLN
INTRAMUSCULAR | Status: AC
  Filled 2020-07-29: qty 1

## 2020-07-29 MED ORDER — DEXTROSE 50 % IV SOLN: 25.0000 mL | Freq: Once | INTRAVENOUS | Status: AC

## 2020-07-29 MED ORDER — FUROSEMIDE 40 MG PO TABS: 20.0000 mg | ORAL_TABLET | Freq: Every day | ORAL | 6 refills | Status: DC

## 2020-07-29 MED ORDER — SODIUM CHLORIDE 0.9 % IV SOLN: 250.0000 mL | INTRAVENOUS | Status: DC | PRN

## 2020-07-29 MED ORDER — HEPARIN (PORCINE) IN NACL 1000-0.9 UT/500ML-% IV SOLN
INTRAVENOUS | Status: DC | PRN
  Administered 2020-07-29 (×2): 500 mL

## 2020-07-29 MED ORDER — HEPARIN (PORCINE) IN NACL 1000-0.9 UT/500ML-% IV SOLN
INTRAVENOUS | Status: AC
  Filled 2020-07-29: qty 1500

## 2020-07-29 MED ORDER — SODIUM CHLORIDE 0.9% FLUSH: 3.0000 mL | Freq: Two times a day (BID) | INTRAVENOUS | Status: DC

## 2020-07-29 MED ORDER — FENTANYL CITRATE (PF) 100 MCG/2ML IJ SOLN
INTRAMUSCULAR | Status: DC | PRN
  Administered 2020-07-29: 25 ug via INTRAVENOUS

## 2020-07-29 MED ORDER — DEXTROSE 50 % IV SOLN
INTRAVENOUS | Status: AC
  Administered 2020-07-29: 25 mL via INTRAVENOUS
  Filled 2020-07-29: qty 50

## 2020-07-29 MED ORDER — VERAPAMIL HCL 2.5 MG/ML IV SOLN
INTRAVENOUS | Status: DC | PRN
  Administered 2020-07-29: 10 mL via INTRA_ARTERIAL

## 2020-07-29 SURGICAL SUPPLY — 12 items
BAG SNAP BAND KOVER 36X36 (MISCELLANEOUS) ×1 IMPLANT
CATH BALLN WEDGE 5F 110CM (CATHETERS) ×1 IMPLANT
CATH INFINITI 5FR MULTPACK ANG (CATHETERS) ×1 IMPLANT
COVER DOME SNAP 22 D (MISCELLANEOUS) ×1 IMPLANT
GLIDESHEATH SLEND SS 6F .021 (SHEATH) ×1 IMPLANT
GUIDEWIRE INQWIRE 1.5J.035X260 (WIRE) IMPLANT
INQWIRE 1.5J .035X260CM (WIRE) ×2
KIT HEART LEFT (KITS) ×2 IMPLANT
PACK CARDIAC CATHETERIZATION (CUSTOM PROCEDURE TRAY) ×2 IMPLANT
SHEATH GLIDE SLENDER 4/5FR (SHEATH) ×1 IMPLANT
TRANSDUCER W/STOPCOCK (MISCELLANEOUS) ×2 IMPLANT
TUBING CIL FLEX 10 FLL-RA (TUBING) ×1 IMPLANT

## 2020-07-29 NOTE — Progress Notes (Signed)
Discharge instructions reviewed with patient. Left message with roommate to go over discharge instructions. Report given to Denice Paradise, RN. Patient to be discharged at 1530. Patient verbalized understanding of instructions.

## 2020-07-29 NOTE — Discharge Instructions (Signed)
1. Decrease furosemide (Lasix) to 1/2 tablet (20 mg) once daily.  2. Restart Eliquis at 9 pm tonight.    Radial Site Care  This sheet gives you information about how to care for yourself after your procedure. Your health care provider may also give you more specific instructions. If you have problems or questions, contact your health care provider. What can I expect after the procedure? After the procedure, it is common to have:  Bruising and tenderness at the catheter insertion area. Follow these instructions at home: Medicines  Take over-the-counter and prescription medicines only as told by your health care provider. Insertion site care  Follow instructions from your health care provider about how to take care of your insertion site. Make sure you: ? Wash your hands with soap and water before you change your bandage (dressing). If soap and water are not available, use hand sanitizer. ? Change your dressing as told by your health care provider. ? Leave stitches (sutures), skin glue, or adhesive strips in place. These skin closures may need to stay in place for 2 weeks or longer. If adhesive strip edges start to loosen and curl up, you may trim the loose edges. Do not remove adhesive strips completely unless your health care provider tells you to do that.  Check your insertion site every day for signs of infection. Check for: ? Redness, swelling, or pain. ? Fluid or blood. ? Pus or a bad smell. ? Warmth.  Do not take baths, swim, or use a hot tub until your health care provider approves.  You may shower 24-48 hours after the procedure, or as directed by your health care provider. ? Remove the dressing and gently wash the site with plain soap and water. ? Pat the area dry with a clean towel. ? Do not rub the site. That could cause bleeding.  Do not apply powder or lotion to the site. Activity  For 24 hours after the procedure, or as directed by your health care provider: ? Do  not flex or bend the affected arm. ? Do not push or pull heavy objects with the affected arm. ? Do not drive yourself home from the hospital or clinic. You may drive 24 hours after the procedure unless your health care provider tells you not to. ? Do not operate machinery or power tools.  Do not lift anything that is heavier than 10 lb (4.5 kg), or the limit that you are told, until your health care provider says that it is safe.  Ask your health care provider when it is okay to: ? Return to work or school. ? Resume usual physical activities or sports. ? Resume sexual activity.   General instructions  If the catheter site starts to bleed, raise your arm and put firm pressure on the site. If the bleeding does not stop, get help right away. This is a medical emergency.  If you went home on the same day as your procedure, a responsible adult should be with you for the first 24 hours after you arrive home.  Keep all follow-up visits as told by your health care provider. This is important. Contact a health care provider if:  You have a fever.  You have redness, swelling, or yellow drainage around your insertion site. Get help right away if:  You have unusual pain at the radial site.  The catheter insertion area swells very fast.  The insertion area is bleeding, and the bleeding does not stop when you hold  steady pressure on the area.  Your arm or hand becomes pale, cool, tingly, or numb. These symptoms may represent a serious problem that is an emergency. Do not wait to see if the symptoms will go away. Get medical help right away. Call your local emergency services (911 in the [place] Do not drive yourself to the hospital. Summary  After the procedure, it is common to have bruising and tenderness at the site.  Follow instructions from your health care provider about how to take care of your radial site wound. Check the wound every day for signs of infection.  Do not lift anything  that is heavier than 10 lb (4.5 kg), or the limit that you are told, until your health care provider says that it is safe. This information is not intended to replace advice given to you by your health care provider. Make sure you discuss any questions you have with your health care provider. Document Revised: 07/25/2017 Document Reviewed: 07/25/2017 Elsevier Patient Education  2021 [employer].

## 2020-07-29 NOTE — Interval H&P Note (Signed)
History and Physical Interval Note:  07/29/2020 11:32 AM  Vincent Chandler  has presented today for surgery, with the diagnosis of heart failure.  The various methods of treatment have been discussed with the patient and family. After consideration of risks, benefits and other options for treatment, the patient has consented to  Procedure(s): RIGHT/LEFT HEART CATH AND CORONARY/GRAFT ANGIOGRAPHY (N/A) as a surgical intervention.  The patient's history has been reviewed, patient examined, no change in status, stable for surgery.  I have reviewed the patient's chart and labs.  Questions were answered to the patient's satisfaction.     Vincent Chandler

## 2020-07-30 ENCOUNTER — Encounter (HOSPITAL_COMMUNITY): Payer: Medicare HMO | Admitting: Cardiology

## 2020-07-30 ENCOUNTER — Encounter (HOSPITAL_COMMUNITY): Payer: Self-pay | Admitting: Cardiology

## 2020-08-02 ENCOUNTER — Telehealth (HOSPITAL_COMMUNITY): Payer: Self-pay | Admitting: Pharmacist

## 2020-08-02 NOTE — Telephone Encounter (Signed)
Received message that patient will be started on [employer]. Started [employer] enrollment application. Will needed updated patient address before sending in. Current address in Epic is only a PO box. Left VM for patient. Will send in enrollment form once updated address is obtained.   Audry Riles, PharmD, BCPS, BCCP, CPP [facility] Clinic Pharmacist [phone]

## 2020-08-03 ENCOUNTER — Other Ambulatory Visit (HOSPITAL_COMMUNITY): Payer: Self-pay | Admitting: Cardiology

## 2020-08-03 MED FILL — ELIQUIS 5 MG TABLET: 5 | 90 days supply | Qty: 180 | Fill #0

## 2020-08-03 NOTE — Telephone Encounter (Signed)
Sent [employer] patient enrollment form to [employer]. Of note, patient never returned my call to provide a physical address.  Audry Riles, PharmD, BCPS, BCCP, CPP [facility] Clinic Pharmacist [phone]

## 2020-08-05 DIAGNOSIS — I509 Heart failure, unspecified: Secondary | ICD-10-CM | POA: Diagnosis not present

## 2020-08-05 DIAGNOSIS — I2510 Atherosclerotic heart disease of native coronary artery without angina pectoris: Secondary | ICD-10-CM | POA: Diagnosis not present

## 2020-08-05 LAB — ANAEROBIC AND AEROBIC CULTURE
AER RESULT:: NO GROWTH
MICRO NUMBER:: 11387128
MICRO NUMBER:: 11387129
SPECIMEN QUALITY:: ADEQUATE
SPECIMEN QUALITY:: ADEQUATE

## 2020-08-05 LAB — SYNOVIAL FLUID, CRYSTAL

## 2020-08-05 LAB — CELL COUNT AND DIFF, FLUID, OTHER

## 2020-08-08 NOTE — Progress Notes (Incomplete)
Advanced [facility] Failure Clinic Note  Date:  08/08/2020   ID:  Vincent Chandler, Vincent Chandler 20-Apr-1961, MRN [id number]   Provider location: [facility] Type of Visit: Established patient  PCP:  Ann Held, DO  Cardiologist: Dr. [name]   History of Present Illness: Vincent Chandler is a 60 y.o. male who has a history of A flutter, chronic systolic heart failure, CAD s/p CABG, CKD Stage III, DMII, OSA .  Admitted 08/07/17 with atrial flutter/RVR and acute on chronic systolic heart failure. Underwent successful DC/CV on 2/8. Required short term milrinone but was able to wean off. HF meds started. Echo this admission with EF 15%. D/C weight 201 pounds.  CPX in 2/19 showed severe functional impairment due to heart failure. However, PFTs were restrictive and high resolution CT chest was concerning for interstitial lung disease.   He has had atrial flutter ablation.   He had RHC in 4/19 showing preserved cardiac output and low filling pressures, but moderate pulmonary hypertension.   He saw pulmonary regarding interstitial lung disease and had bronchoscopy.  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  He has been started on prednisone, cough is much improved on prednisone.    He saw Dr. Caryl Comes, not planning to upgrade ICD to CRT given IVCD but not LBBB-like.   He had a Barostim device placed in 1/20.   I tried him on [place] but he developed orthostasis after starting it and had to stop.   He has been on prednisone for suspected amiodarone pulmonary toxicity/pulmonary fibrosis.  He has had a chronic cough.   With worsened dyspnea, I took him for RHC in 6/20.  This showed normal filling pressures and low but not markedly low cardiac output. High resolution CT chest in 6/20 showed interstitial lung disease in UIP patttern. He saw pulmonary and prednisone was increased then tapered back down.   Echo in 3/21 showed EF 20-25%, mild RV dilation with  moderately decreased systolic function.   He returned 1/22 for followup of CHF and CAD.  Using CPAP at night and oxygen most of the time during the day.  Weight is down 7 lbs.  He has been symptomatically worse for about 2-3 weeks.  He has noticed pressure across his chest and burning in his chest with minor activities like showering and walking across his house.  He has been short of breath with these same activities as well.  He worries that CAD has worsened as these symptoms are similar to his prior ischemic symptoms. Poor stamina.  Lightheadedness with standing, SBP 90 today.  No orthopnea/PND.Spironolactone decreasedmto 12.5 mg daily and Coreg to 3.125 mg bid to promote renal perfusion and higher BP. He was scheduled for LHC.    REDS clip 41%  ECG (personally reviewed): NSR, 1st degree AVB, old anterolateral MI, old inferior MI.   Labs (2/19): free T4 increased but free T3 normal, LDL 86, HDL 30 Labs (3/19): K 4.9, creatinine 1.53, hgb 14.3, LDL 52, ANA negative, RF negative Labs (4/19): K 4.2, creatinine 1.27 Labs (5/19): LDL 69, K 4.4, creatinine 1.36 Labs (8/19): K 5.3, creatinine 2.2 Labs (10/19): K 5, creatinine 1.49 Labs (12/19): hgb 13.4 Labs (2/20): K 4.2, creatinine 1.45, hgb 14.3 Labs (5/20): K 4.2, creatinine 1.81, digoxin 0.6, LDL 16, TGs 396, hgb 13.8 Labs (6/20): creatinine 2 Labs (8/20): LDL 27, Tgs 284, LFTs normal, K 4.5, creatinine 2.3.  Labs (10/20): K 4.4, creatinine  1.79, hgb 13.4 Labs (12/20): LDL 68, TGs 255 Labs (3/21): K 4.3, creatinine 2.19 Labs (6/21): K 4.4, creatinine 1.87 Labs (7/21): K 4.5, creatinine 2.2, digoxin 0.7 Labs (12/21): LDL 54, HDL 56, TGs 178 Labs (1/22): K 4.7 => 5.1, creatinine 1.77 => 2.05  PMH:  1. Atrial fibrillation: Noted post-op CABG.  2. Atrial flutter: Noted during 2/19 admission, DCCV done.  S/p Ablation 3/19.  3. CAD: s/p CABG.  - LHC (1/16):  Patent LIMA-LAD and SVG-D. 80% mLCx and 99% PLOM, total occlusion of SVG-PLOM.   Total occlusion of RCA with occlusion of SVG-RCA.  Patient had DES to mLCx-PLOM.  - R/LHC (1/22): Occluded SVG-OM and SVG-RCA with patent LIMA-LAD and SVG-D, Occluded proximal RCA, Occluded mid LCX.  This is new. Low filling pressures. Moderate pulmonary arterial hypertension. Preserved cardiac output. 4. HTN 5. Type II diabetes  6. Hyperlipidemia 7. Chronic systolic CHF: Ischemic cardiomyopathy.   - Echo (2/19): EF 15%, mildly dilated LV, mild LVH, inferolateral akinesis, milr MR, mildly dilated RV with severely reduced systolic function.  - CPX (2/19): Peak VO2 12.2, VE/VCO2 slope 53, RER 1.07 => severe limitation from heart failure.  - Medtronic ICD  - RHC (4/19): mean RA 5, PA 54/18 mean 31, mean PCWP 12, CI 2.5, PVR 3.6 WU - Echo (1/20): EF 20% - Barostimulator device placed in 1/20.  - RHC (6/20): mean RA 6, PA 42/17 mean 27, mean PCWP 12, CI 2.29, PVR 3.1  - Echo (3/21): EF 20-25%, mild RV dilation with moderately decreased systolic function. - R/LHC (1/22): RA mean 1, RV 48/1, PA 46/12 (26), PCWP mean 7, CO 4.46, CI 2.34, PVR 4.2 WU. 8. Interstitial lung disease: PFTs (2/19) were restrictive, raising concern for ILD.  - High resolution CT chest in 2/19: interstitial lung disease concerning for usual interstitial pneumonitis. - Bronchoscopy was done, possible amiodarone lung toxicity causing ILD, now on prednisone.  - High resolution chest CT (6/20): ILD in UIP pattern.   - Ofev 9. CKD stage 3 10. OSA: Home sleep study in 2020 with severe OSA.  11. ABIs (2/19): not significantly abnormal - ABIs (8/21): Normal 12. Carotid dopplers (2/19): Mild disease only.  - Carotid dopplers (1/20): Minimal stenosis.  13. Pulmonary hypertension: ?Group 3 due to ILD.  14. Nephrolithiasis 15. Gout   Current Outpatient Medications  Medication Sig Dispense Refill  . ACCU-CHEK FASTCLIX LANCETS MISC Use as directed once a day.  Dx code: E11.9 100 each 1  . albuterol (ACCUNEB) 0.63 MG/3ML nebulizer  solution Take 3 mLs (0.63 mg total) by nebulization every 6 (six) hours as needed for wheezing. 75 mL 12  . albuterol (VENTOLIN HFA) 108 (90 Base) MCG/ACT inhaler Inhale 2 puffs into the lungs every 6 (six) hours as needed for wheezing or shortness of breath.     Marland Kitchen atorvastatin (LIPITOR) 40 MG tablet TAKE 1 TABLET BY MOUTH AT BEDTIME. (Patient taking differently: Take 40 mg by mouth daily.) 30 tablet 3  . blood glucose meter kit and supplies Dispense based on patient and insurance preference. Use as directed once a day. Dx Code E11.9. 1 each 0  . Blood Glucose Monitoring Suppl (ACCU-CHEK GUIDE) w/Device KIT 1 each by Does not apply route daily. DX Code: E11.9 1 kit 0  . carvedilol (COREG) 3.125 MG tablet Take 1 tablet (3.125 mg total) by mouth 2 (two) times daily. 180 tablet 3  . Continuous Blood Gluc Sensor (FREESTYLE LIBRE 2 SENSOR) MISC 1 Device by Does not apply route  as directed. 2 each 11  . digoxin (LANOXIN) 0.125 MG tablet TAKE 1/2 TABLET BY MOUTH DAILY. (Patient taking differently: Take 0.0625 mg by mouth daily.) 45 tablet 3  . ELIQUIS 5 MG TABS tablet TAKE 1 TABLET BY MOUTH 2 TIMES DAILY. 180 tablet 3  . ENTRESTO 24-26 MG TAKE 1 TABLET BY MOUTH 2 (TWO) TIMES DAILY. (Patient taking differently: Take 1 tablet by mouth 2 (two) times daily.) 60 tablet 11  . furosemide (LASIX) 40 MG tablet Take 0.5 tablets (20 mg total) by mouth daily. 30 tablet 6  . glucose blood (ACCU-CHEK GUIDE) test strip Use as directed once a day.  Dx code: E11.9 100 each 1  . guaiFENesin (MUCINEX) 600 MG 12 hr tablet Take 1 tablet (600 mg total) by mouth 2 (two) times daily as needed for to loosen phlegm.    Marland Kitchen icosapent Ethyl (VASCEPA) 1 g capsule Take 2 capsules (2 g total) by mouth 2 (two) times daily. 120 capsule 6  . metFORMIN (GLUCOPHAGE) 500 MG tablet Take 1 tablet (500 mg total) by mouth 2 (two) times daily with a meal. 180 tablet 3  . mometasone-formoterol (DULERA) 200-5 MCG/ACT AERO Inhale 2 puffs into the lungs 2  (two) times a day. (Patient taking differently: Inhale 2 puffs into the lungs daily as needed for shortness of breath or wheezing.) 2 Inhaler 0  . Nintedanib (OFEV) 150 MG CAPS Take 150 mg by mouth daily.    . nitroGLYCERIN (NITROSTAT) 0.4 MG SL tablet Place 1 tablet (0.4 mg total) under the tongue every 5 (five) minutes as needed for chest pain. 25 tablet 3  . OXYGEN Inhale 3 L into the lungs daily as needed (low oxygen).    . predniSONE (DELTASONE) 10 MG tablet Take 1 tablet (10 mg total) by mouth daily with breakfast. 30 tablet 1  . spironolactone (ALDACTONE) 25 MG tablet Take 0.5 tablets (12.5 mg total) by mouth daily. 45 tablet 3  . Treprostinil (TYVASO STARTER) 0.6 MG/ML SOLN Inhale 18 mcg into the lungs 4 (four) times daily.     No current facility-administered medications for this visit.    Allergies:   Patient has no known allergies.   Social History:  The patient  reports that he has never smoked. His smokeless tobacco use includes chew. He reports current alcohol use of about 3.0 standard drinks of alcohol per week. He reports that he does not use drugs.   Family History:  The patient's family history includes Diabetes in his father and mother; Hyperlipidemia in his father; Hypertension in his father and mother.   ROS:  Please see the history of present illness.   All other systems are personally reviewed and negative.   Exam:    There were no vitals taken for this visit. General:  NAD. No resp difficulty HEENT: Normal Neck: Supple. No JVD. Carotids 2+ bilat; no bruits. No lymphadenopathy or thryomegaly appreciated. Cor: PMI nondisplaced. Regular rate & rhythm. No rubs, gallops or murmurs. Lungs: Clear Abdomen: Soft, nontender, nondistended. No hepatosplenomegaly. No bruits or masses. Good bowel sounds. Extremities: No cyanosis, clubbing, rash, edema Neuro: alert & oriented x 3, cranial nerves grossly intact. Moves all 4 extremities w/o difficulty. Affect pleasant.  Recent  Labs: 04/27/2020: B Natriuretic Peptide 116.6 07/07/2020: ALT 11 07/21/2020: Platelets 183 07/29/2020: BUN 30; Creatinine, Ser 1.40; Hemoglobin 11.6; Hemoglobin 11.2; Potassium 4.2; Potassium 4.0; Sodium 143; Sodium 144  Personally reviewed   Wt Readings from Last 3 Encounters:  07/29/20 72.6 kg  07/21/20  71.4 kg  07/06/20 74.8 kg    ASSESSMENT AND PLAN:  1. Chronic systolic CHF: Ischemic cardiomyopathy. Medtronic ICD single chamber. 2/19 admission for decompensated HF requiring milrinone. Echo 2/7/019 EF 15% Mildly dilated RV. CPX in 2/19 was suggestive of severe limitation due to HF.   RHC in 4/19 showed normal filling pressures and preserved cardiac output.  Echo in 1/20 with EF 20%.  He had a Barostimulator device placed in 1/20.  RHC in 6/20 showed normal filling pressures, CI 2.29.  Echo 3/21 with EF 20-25%.  Symptomatically worse today, NYHA class IIIb.  On exam, he does not look volume overloaded and weight is down.  However, REDS clip elevated at 41% (could this be inaccurate due to ILD?).  Poor stamina, fatigue, and soft BP.  I am concerned that his symptoms are due to low output HF.  - Continue spironolactone 12.5 mg daily. - Continue Coreg to 3.125 mg bid. - Continue digoxin 0.0625 mg daily, level ok today.    - Continue Entresto 24/26 bid.      - Continue Lasix 20 mg daily. - Has IVCD, not LBBB-like.  Saw Dr. Caryl Comes, will not upgrade ICD to CRT.  - With significant ILD requiring oxygen, I do not think that he would be an LVAD candidate.   - BMET and digoxin level today. 2. Afib/ Atrial flutter: Paroxysmal.  Admitted in 2/19 with severe HF decompensation in setting of atrial flutter with RVR.  He was cardioverted.  He then had atrial flutter ablation in 3/19. He is off amiodarone due to question of amiodarone lung toxicity. No palpitations.  NSR today.  - He is now off amiodarone (question of lung toxicity, would not re-challenge in future).   - Continue Eliquis 5 mg bid. Denies  bleeding.  3. CAD: S/p CABG.  Last intervention was PCI to LCx in 2016. Repeat cath (1/22) with occluded SVG-OM and SVG-RCA with patent LIMA-LAD and SVG-D, occluded proximal RCA, and new occluded mid LCX. No targets for revascularization. - Continue atorvastatin, good lipids in 12/21.   - Triglycerides improved on Vascepa 2 g bid.  - No ASA with Eliquis use.  4. Pulmonary fibrosis: PFTs in 2/19 were restrictive, and high resolution CT showed interstitial lung disease. Amiodarone was stopped due to concern for possible toxicity.  He has seen [name] => bronchoscopy suggestive of ILD related to amiodarone.  He has been on chronic prednisone.  High resolution CT chest repeated in 6/20, again showing ILD, described at UIP pattern. He has started Ofev.  He needs to wear oxygen at all times. - Continue Ofev.    - Continue prednisone 10 mg daily.  - Continue pulmonary followup.  5. OSA: Continue CPAP.    6. CKD: Stage 3.  - Followup with nephrology.  - Check BMET today. 7. Pulmonary hypertension: Suspect group 3 PH due to lung disease (ILD). RHC (1/22) with moderate pulmonary arterial hypertension, PVR 4.2 WU.  - Recently started on Tyvaso 8. DM2: He did not tolerate Farxiga   Followup 2 wks after RHC/LHC.   Cassandria Santee, FNP-BC 08/08/20  08/08/2020  [facility] [address] [facility] [place] [address] [phone] (office) [phone] (fax)

## 2020-08-09 ENCOUNTER — Telehealth (HOSPITAL_COMMUNITY): Payer: Self-pay | Admitting: Cardiology

## 2020-08-09 ENCOUNTER — Encounter (HOSPITAL_COMMUNITY): Payer: Medicare HMO

## 2020-08-09 DIAGNOSIS — J9611 Chronic respiratory failure with hypoxia: Secondary | ICD-10-CM | POA: Diagnosis not present

## 2020-08-09 DIAGNOSIS — G4733 Obstructive sleep apnea (adult) (pediatric): Secondary | ICD-10-CM | POA: Diagnosis not present

## 2020-08-09 DIAGNOSIS — J849 Interstitial pulmonary disease, unspecified: Secondary | ICD-10-CM | POA: Diagnosis not present

## 2020-08-09 NOTE — Telephone Encounter (Signed)
Attempted to contact patient for appt reminder No answer unable to leave message Someone answered phone but did not respond

## 2020-08-10 ENCOUNTER — Telehealth (HOSPITAL_COMMUNITY): Payer: Self-pay | Admitting: Pharmacist

## 2020-08-10 NOTE — Telephone Encounter (Signed)
Patient Advocate Encounter   Received notification from [facility] that prior authorization for Tyvaso is required.   PA submitted via Fax Status is pending   Will continue to follow.   Audry Riles, PharmD, BCPS, BCCP, CPP Heart Failure Clinic Pharmacist [phone]

## 2020-08-12 MED FILL — predniSONE 10 MG TABS: 10 | 30 days supply | Qty: 30 | Fill #1

## 2020-08-12 NOTE — Telephone Encounter (Signed)
Advanced Heart Failure Patient Advocate Encounter  Prior Authorization for Tyvaso has been approved.    Effective dates: 07/03/20 through 07/02/21  Audry Riles, PharmD, BCPS, BCCP, CPP Heart Failure Clinic Pharmacist [phone]

## 2020-08-13 ENCOUNTER — Encounter: Payer: Medicare HMO | Admitting: *Deleted

## 2020-08-13 ENCOUNTER — Other Ambulatory Visit: Payer: Self-pay

## 2020-08-13 ENCOUNTER — Telehealth (HOSPITAL_COMMUNITY): Payer: Self-pay

## 2020-08-13 ENCOUNTER — Encounter (HOSPITAL_COMMUNITY): Payer: Self-pay

## 2020-08-13 ENCOUNTER — Ambulatory Visit (HOSPITAL_COMMUNITY)
Admission: RE | Admit: 2020-08-13 | Discharge: 2020-08-13 | Disposition: A | Discharge status: Home / Self Care | Payer: Medicare HMO | Source: Ambulatory Visit | Attending: Adult Health | Admitting: Adult Health

## 2020-08-13 VITALS — BP 111/74 | HR 92 | Wt 164.6 lb

## 2020-08-13 DIAGNOSIS — Z006 Encounter for examination for normal comparison and control in clinical research program: Secondary | ICD-10-CM

## 2020-08-13 DIAGNOSIS — J8410 Pulmonary fibrosis, unspecified: Secondary | ICD-10-CM

## 2020-08-13 DIAGNOSIS — Z9989 Dependence on other enabling machines and devices: Secondary | ICD-10-CM

## 2020-08-13 DIAGNOSIS — Z9981 Dependence on supplemental oxygen: Secondary | ICD-10-CM | POA: Diagnosis not present

## 2020-08-13 DIAGNOSIS — G4733 Obstructive sleep apnea (adult) (pediatric): Secondary | ICD-10-CM

## 2020-08-13 DIAGNOSIS — I480 Paroxysmal atrial fibrillation: Secondary | ICD-10-CM

## 2020-08-13 DIAGNOSIS — Z7984 Long term (current) use of oral hypoglycemic drugs: Secondary | ICD-10-CM | POA: Insufficient documentation

## 2020-08-13 DIAGNOSIS — I130 Hypertensive heart and chronic kidney disease with heart failure and stage 1 through stage 4 chronic kidney disease, or unspecified chronic kidney disease: Secondary | ICD-10-CM | POA: Diagnosis not present

## 2020-08-13 DIAGNOSIS — I5022 Chronic systolic (congestive) heart failure: Secondary | ICD-10-CM

## 2020-08-13 DIAGNOSIS — I255 Ischemic cardiomyopathy: Secondary | ICD-10-CM | POA: Insufficient documentation

## 2020-08-13 DIAGNOSIS — Z951 Presence of aortocoronary bypass graft: Secondary | ICD-10-CM

## 2020-08-13 DIAGNOSIS — I2510 Atherosclerotic heart disease of native coronary artery without angina pectoris: Secondary | ICD-10-CM

## 2020-08-13 DIAGNOSIS — N183 Chronic kidney disease, stage 3 unspecified: Secondary | ICD-10-CM | POA: Diagnosis not present

## 2020-08-13 DIAGNOSIS — I2721 Secondary pulmonary arterial hypertension: Secondary | ICD-10-CM | POA: Insufficient documentation

## 2020-08-13 DIAGNOSIS — I2723 Pulmonary hypertension due to lung diseases and hypoxia: Secondary | ICD-10-CM | POA: Insufficient documentation

## 2020-08-13 DIAGNOSIS — E1122 Type 2 diabetes mellitus with diabetic chronic kidney disease: Secondary | ICD-10-CM

## 2020-08-13 DIAGNOSIS — I272 Pulmonary hypertension, unspecified: Secondary | ICD-10-CM

## 2020-08-13 DIAGNOSIS — Z7901 Long term (current) use of anticoagulants: Secondary | ICD-10-CM | POA: Diagnosis not present

## 2020-08-13 DIAGNOSIS — Z8249 Family history of ischemic heart disease and other diseases of the circulatory system: Secondary | ICD-10-CM | POA: Insufficient documentation

## 2020-08-13 DIAGNOSIS — I25810 Atherosclerosis of coronary artery bypass graft(s) without angina pectoris: Secondary | ICD-10-CM | POA: Diagnosis not present

## 2020-08-13 DIAGNOSIS — Z7952 Long term (current) use of systemic steroids: Secondary | ICD-10-CM | POA: Diagnosis not present

## 2020-08-13 DIAGNOSIS — N1831 Chronic kidney disease, stage 3a: Secondary | ICD-10-CM

## 2020-08-13 LAB — BASIC METABOLIC PANEL
Anion gap: 12 (ref 5–15)
BUN: 42 mg/dL — ABNORMAL HIGH (ref 6–20)
CO2: 25 mmol/L (ref 22–32)
Calcium: 8.6 mg/dL — ABNORMAL LOW (ref 8.9–10.3)
Chloride: 104 mmol/L (ref 98–111)
Creatinine, Ser: 1.71 mg/dL — ABNORMAL HIGH (ref 0.61–1.24)
GFR, Estimated: 46 mL/min — ABNORMAL LOW (ref >60)
Glucose, Bld: 167 mg/dL — ABNORMAL HIGH (ref 70–99)
Potassium: 3.9 mmol/L (ref 3.5–5.1)
Sodium: 141 mmol/L (ref 135–145)

## 2020-08-13 LAB — DIGOXIN LEVEL: Digoxin Level: 0.6 ng/mL — ABNORMAL LOW (ref 0.8–2.0)

## 2020-08-13 NOTE — Telephone Encounter (Signed)
-----  Message from Rafael Bihari,  sent at 08/13/2020  3:51 PM EST ----- Kidney function elevated, please do NOT increase lasix. Continue with lasix 40 mg daily. Repeat 7-10 days.

## 2020-08-13 NOTE — Research (Signed)
32M Beat HF patient  Patient doing better. Had a scheduled heart catherization no change in cath results.    Current Outpatient Medications:  .  ACCU-CHEK FASTCLIX LANCETS MISC, Use as directed once a day.  Dx code: E11.9, Disp: 100 each, Rfl: 1 .  albuterol (ACCUNEB) 0.63 MG/3ML nebulizer solution, Take 3 mLs (0.63 mg total) by nebulization every 6 (six) hours as needed for wheezing., Disp: 75 mL, Rfl: 12 .  albuterol (VENTOLIN HFA) 108 (90 Base) MCG/ACT inhaler, Inhale 2 puffs into the lungs every 6 (six) hours as needed for wheezing or shortness of breath. , Disp: , Rfl:  .  atorvastatin (LIPITOR) 40 MG tablet, TAKE 1 TABLET BY MOUTH AT BEDTIME. (Patient taking differently: Take 40 mg by mouth daily.), Disp: 30 tablet, Rfl: 3 .  blood glucose meter kit and supplies, Dispense based on patient and insurance preference. Use as directed once a day. Dx Code E11.9., Disp: 1 each, Rfl: 0 .  Blood Glucose Monitoring Suppl (ACCU-CHEK GUIDE) w/Device KIT, 1 each by Does not apply route daily. DX Code: E11.9, Disp: 1 kit, Rfl: 0 .  carvedilol (COREG) 3.125 MG tablet, Take 1 tablet (3.125 mg total) by mouth 2 (two) times daily., Disp: 180 tablet, Rfl: 3 .  Continuous Blood Gluc Sensor (FREESTYLE LIBRE 2 SENSOR) MISC, 1 Device by Does not apply route as directed., Disp: 2 each, Rfl: 11 .  digoxin (LANOXIN) 0.125 MG tablet, TAKE 1/2 TABLET BY MOUTH DAILY. (Patient taking differently: Take 0.0625 mg by mouth daily.), Disp: 45 tablet, Rfl: 3 .  ELIQUIS 5 MG TABS tablet, TAKE 1 TABLET BY MOUTH 2 TIMES DAILY., Disp: 180 tablet, Rfl: 3 .  ENTRESTO 24-26 MG, TAKE 1 TABLET BY MOUTH 2 (TWO) TIMES DAILY. (Patient taking differently: Take 1 tablet by mouth 2 (two) times daily.), Disp: 60 tablet, Rfl: 11 .  furosemide (LASIX) 40 MG tablet, Take 0.5 tablets (20 mg total) by mouth daily., Disp: 30 tablet, Rfl: 6 .  glucose blood (ACCU-CHEK GUIDE) test strip, Use as directed once a day.  Dx code: E11.9, Disp: 100 each,  Rfl: 1 .  guaiFENesin (MUCINEX) 600 MG 12 hr tablet, Take 1 tablet (600 mg total) by mouth 2 (two) times daily as needed for to loosen phlegm., Disp: , Rfl:  .  icosapent Ethyl (VASCEPA) 1 g capsule, Take 2 capsules (2 g total) by mouth 2 (two) times daily., Disp: 120 capsule, Rfl: 6 .  metFORMIN (GLUCOPHAGE) 500 MG tablet, Take 1 tablet (500 mg total) by mouth 2 (two) times daily with a meal., Disp: 180 tablet, Rfl: 3 .  mometasone-formoterol (DULERA) 200-5 MCG/ACT AERO, Inhale 2 puffs into the lungs 2 (two) times a day. (Patient taking differently: Inhale 2 puffs into the lungs daily as needed for shortness of breath or wheezing.), Disp: 2 Inhaler, Rfl: 0 .  Nintedanib (OFEV) 150 MG CAPS, Take 150 mg by mouth daily., Disp: , Rfl:  .  nitroGLYCERIN (NITROSTAT) 0.4 MG SL tablet, Place 1 tablet (0.4 mg total) under the tongue every 5 (five) minutes as needed for chest pain., Disp: 25 tablet, Rfl: 3 .  OXYGEN, Inhale 3 L into the lungs daily as needed (low oxygen)., Disp: , Rfl:  .  predniSONE (DELTASONE) 10 MG tablet, Take 1 tablet (10 mg total) by mouth daily with breakfast., Disp: 30 tablet, Rfl: 1 .  spironolactone (ALDACTONE) 25 MG tablet, Take 0.5 tablets (12.5 mg total) by mouth daily., Disp: 45 tablet, Rfl: 3

## 2020-08-13 NOTE — Patient Instructions (Signed)
INCREASE Lasix to 60 mg (3 tabs) twice a day for 2 days, then resume normal dose of 40 mg (2 tabs) twice a day  Labs today We will only contact you if something comes back abnormal or we need to make some changes. Otherwise no news is good news!  Your physician recommends that you schedule a follow-up appointment in: 10-14 days for labs and device check (NURSE VISIT)  Your physician recommends that you schedule a follow-up appointment in: 3 months with Dr [name]  Do the following things EVERYDAY: 1) Weigh yourself in the morning before breakfast. Write it down and keep it in a log. 2) Take your medicines as prescribed 3) Eat low salt foods-Limit salt (sodium) to 2000 mg per day.  4) Stay as active as you can everyday 5) Limit all fluids for the day to less than 2 liters  At the [facility], you and your health needs are our priority. As part of our continuing mission to provide you with exceptional heart care, we have created designated Provider Care Teams. These Care Teams include your primary Cardiologist (physician) and Advanced Practice Providers (APPs- Physician Assistants and Nurse Practitioners) who all work together to provide you with the care you need, when you need it.   You may see any of the following providers on your designated Care Team at your next follow up: Marland Kitchen Dr Glori Bickers . Dr Loralie Champagne . Darrick Grinder, NP . Lyda Jester, [place] . Audry Riles, PharmD   Please be sure to bring in all your medications bottles to every appointment.

## 2020-08-13 NOTE — Progress Notes (Signed)
Advanced Heart Failure Clinic Note  Date:  08/13/2020   PCP:  Ann Held, DO  Cardiologist: Dr. [name]   History of Present Illness: Vincent Chandler is a 60 y.o. male who has a history of A flutter, chronic systolic heart failure, CAD s/p CABG, CKD Stage III, DMII, OSA .  Admitted 08/07/17 with atrial flutter/RVR and acute on chronic systolic heart failure. Underwent successful DC/CV on 2/8. Required short term milrinone but was able to wean off. HF meds started. Echo this admission with EF 15%. D/C weight 201 pounds.  CPX in 2/19 showed severe functional impairment due to heart failure. However, PFTs were restrictive and high resolution CT chest was concerning for interstitial lung disease.   He has had atrial flutter ablation.   He had RHC in 4/19 showing preserved cardiac output and low filling pressures, but moderate pulmonary hypertension.   He saw pulmonary regarding interstitial lung disease and had bronchoscopy.  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  He has been started on prednisone, cough is much improved on prednisone.    He saw Dr. Caryl Comes, not planning to upgrade ICD to CRT given IVCD but not LBBB-like.   He had a Barostim device placed in 1/20.   I tried him on [place] but he developed orthostasis after starting it and had to stop.   He has been on prednisone for suspected amiodarone pulmonary toxicity/pulmonary fibrosis.  He has had a chronic cough.   With worsened dyspnea, I took him for RHC in 6/20.  This showed normal filling pressures and low but not markedly low cardiac output. High resolution CT chest in 6/20 showed interstitial lung disease in UIP patttern. He saw pulmonary and prednisone was increased then tapered back down.   Echo in 3/21 showed EF 20-25%, mild RV dilation with moderately decreased systolic function.   He returned 1/22 for followup of CHF and CAD.  Using CPAP at night and oxygen most of the time during  the day.  Weight is down 7 lbs.  He has been symptomatically worse for about 2-3 weeks.  He has noticed pressure across his chest and burning in his chest with minor activities like showering and walking across his house.  He has been short of breath with these same activities as well.  He worries that CAD has worsened as these symptoms are similar to his prior ischemic symptoms. Poor stamina.  Lightheadedness with standing, SBP 90 today.  No orthopnea/PND.Spironolactone decreased to 12.5 mg daily and Coreg to 3.125 mg bid to promote renal perfusion and higher BP. He was scheduled for LHC.  Today he returns for HF follow up. Overall feeling better, dizziness resolved. Does get SOB with minimal activity. Denies increasing CP, dizziness, edema, or PND/Orthopnea. Appetite ok, has been eating more sweets, mostly eats take out & [employer]. No fever or chills. Does not weigh at home.Taking all medications. Wears CPAP ~3-4 nights/week. Has not started Tyvaso. He is not wearing oxygen.  ECG (personally reviewed): ST w/ AVB, PR 248 ms, 107 bpm (personally reviewed).  ICD Interrogation (personally reviewed): OptiVol trending above threshold, thoracic impedence down, 5 episodes of Afib, longest lasting ~50 minutes, No VT/VF, daily activity ~1-2 hours.  Labs (2/19): free T4 increased but free T3 normal, LDL 86, HDL 30 Labs (3/19): K 4.9, creatinine 1.53, hgb 14.3, LDL 52, ANA negative, RF negative Labs (4/19): K 4.2, creatinine 1.27 Labs (5/19): LDL 69, K 4.4, creatinine 1.36 Labs (  8/19): K 5.3, creatinine 2.2 Labs (10/19): K 5, creatinine 1.49 Labs (12/19): hgb 13.4 Labs (2/20): K 4.2, creatinine 1.45, hgb 14.3 Labs (5/20): K 4.2, creatinine 1.81, digoxin 0.6, LDL 16, TGs 396, hgb 13.8 Labs (6/20): creatinine 2 Labs (8/20): LDL 27, Tgs 284, LFTs normal, K 4.5, creatinine 2.3.  Labs (10/20): K 4.4, creatinine 1.79, hgb 13.4 Labs (12/20): LDL 68, TGs 255 Labs (3/21): K 4.3, creatinine 2.19 Labs (6/21): K 4.4,  creatinine 1.87 Labs (7/21): K 4.5, creatinine 2.2, digoxin 0.7 Labs (12/21): LDL 54, HDL 56, TGs 178 Labs (1/22): K 4.7 => 5.1, creatinine 1.77 => 2.05  PMH:  1. Atrial fibrillation: Noted post-op CABG.  2. Atrial flutter: Noted during 2/19 admission, DCCV done.  S/p Ablation 3/19.  3. CAD: s/p CABG.  - LHC (1/16):  Patent LIMA-LAD and SVG-D. 80% mLCx and 99% PLOM, total occlusion of SVG-PLOM.  Total occlusion of RCA with occlusion of SVG-RCA.  Patient had DES to mLCx-PLOM.  - R/LHC (1/22): Occluded SVG-OM and SVG-RCA with patent LIMA-LAD and SVG-D, Occluded proximal RCA, Occluded mid LCX.  This is new. Low filling pressures. Moderate pulmonary arterial hypertension. Preserved cardiac output. 4. HTN 5. Type II diabetes  6. Hyperlipidemia 7. Chronic systolic CHF: Ischemic cardiomyopathy.   - Echo (2/19): EF 15%, mildly dilated LV, mild LVH, inferolateral akinesis, milr MR, mildly dilated RV with severely reduced systolic function.  - CPX (2/19): Peak VO2 12.2, VE/VCO2 slope 53, RER 1.07 => severe limitation from heart failure.  - Medtronic ICD  - RHC (4/19): mean RA 5, PA 54/18 mean 31, mean PCWP 12, CI 2.5, PVR 3.6 WU - Echo (1/20): EF 20% - Barostimulator device placed in 1/20.  - RHC (6/20): mean RA 6, PA 42/17 mean 27, mean PCWP 12, CI 2.29, PVR 3.1  - Echo (3/21): EF 20-25%, mild RV dilation with moderately decreased systolic function. - R/LHC (1/22): RA mean 1, RV 48/1, PA 46/12 (26), PCWP mean 7, CO 4.46, CI 2.34, PVR 4.2 WU. 8. Interstitial lung disease: PFTs (2/19) were restrictive, raising concern for ILD.  - High resolution CT chest in 2/19: interstitial lung disease concerning for usual interstitial pneumonitis. - Bronchoscopy was done, possible amiodarone lung toxicity causing ILD, now on prednisone.  - High resolution chest CT (6/20): ILD in UIP pattern.   - Ofev 9. CKD stage 3 10. OSA: Home sleep study in 2020 with severe OSA.  11. ABIs (2/19): not significantly  abnormal - ABIs (8/21): Normal 12. Carotid dopplers (2/19): Mild disease only.  - Carotid dopplers (1/20): Minimal stenosis.  13. Pulmonary hypertension: ?Group 3 due to ILD.  14. Nephrolithiasis 15. Gout  Current Outpatient Medications  Medication Sig Dispense Refill  . ACCU-CHEK FASTCLIX LANCETS MISC Use as directed once a day.  Dx code: E11.9 100 each 1  . albuterol (ACCUNEB) 0.63 MG/3ML nebulizer solution Take 3 mLs (0.63 mg total) by nebulization every 6 (six) hours as needed for wheezing. 75 mL 12  . albuterol (VENTOLIN HFA) 108 (90 Base) MCG/ACT inhaler Inhale 2 puffs into the lungs every 6 (six) hours as needed for wheezing or shortness of breath.     Marland Kitchen atorvastatin (LIPITOR) 40 MG tablet TAKE 1 TABLET BY MOUTH AT BEDTIME. (Patient taking differently: Take 40 mg by mouth daily.) 30 tablet 3  . blood glucose meter kit and supplies Dispense based on patient and insurance preference. Use as directed once a day. Dx Code E11.9. 1 each 0  . Blood Glucose Monitoring Suppl (  ACCU-CHEK GUIDE) w/Device KIT 1 each by Does not apply route daily. DX Code: E11.9 1 kit 0  . carvedilol (COREG) 3.125 MG tablet Take 1 tablet (3.125 mg total) by mouth 2 (two) times daily. 180 tablet 3  . Continuous Blood Gluc Sensor (FREESTYLE LIBRE 2 SENSOR) MISC 1 Device by Does not apply route as directed. 2 each 11  . digoxin (LANOXIN) 0.125 MG tablet TAKE 1/2 TABLET BY MOUTH DAILY. (Patient taking differently: Take 0.0625 mg by mouth daily.) 45 tablet 3  . ELIQUIS 5 MG TABS tablet TAKE 1 TABLET BY MOUTH 2 TIMES DAILY. 180 tablet 3  . ENTRESTO 24-26 MG TAKE 1 TABLET BY MOUTH 2 (TWO) TIMES DAILY. (Patient taking differently: Take 1 tablet by mouth 2 (two) times daily.) 60 tablet 11  . furosemide (LASIX) 40 MG tablet Take 0.5 tablets (20 mg total) by mouth daily. 30 tablet 6  . glucose blood (ACCU-CHEK GUIDE) test strip Use as directed once a day.  Dx code: E11.9 100 each 1  . guaiFENesin (MUCINEX) 600 MG 12 hr tablet  Take 1 tablet (600 mg total) by mouth 2 (two) times daily as needed for to loosen phlegm.    Marland Kitchen icosapent Ethyl (VASCEPA) 1 g capsule Take 2 capsules (2 g total) by mouth 2 (two) times daily. 120 capsule 6  . metFORMIN (GLUCOPHAGE) 500 MG tablet Take 1 tablet (500 mg total) by mouth 2 (two) times daily with a meal. 180 tablet 3  . mometasone-formoterol (DULERA) 200-5 MCG/ACT AERO Inhale 2 puffs into the lungs 2 (two) times a day. (Patient taking differently: Inhale 2 puffs into the lungs daily as needed for shortness of breath or wheezing.) 2 Inhaler 0  . Nintedanib (OFEV) 150 MG CAPS Take 150 mg by mouth daily.    . nitroGLYCERIN (NITROSTAT) 0.4 MG SL tablet Place 1 tablet (0.4 mg total) under the tongue every 5 (five) minutes as needed for chest pain. 25 tablet 3  . OXYGEN Inhale 3 L into the lungs daily as needed (low oxygen).    . predniSONE (DELTASONE) 10 MG tablet Take 1 tablet (10 mg total) by mouth daily with breakfast. 30 tablet 1  . spironolactone (ALDACTONE) 25 MG tablet Take 0.5 tablets (12.5 mg total) by mouth daily. 45 tablet 3  . Treprostinil (TYVASO STARTER) 0.6 MG/ML SOLN Inhale 18 mcg into the lungs 4 (four) times daily.     No current facility-administered medications for this encounter.    Allergies:   Patient has no known allergies.   Social History:  The patient  reports that he has never smoked. His smokeless tobacco use includes chew. He reports current alcohol use of about 3.0 standard drinks of alcohol per week. He reports that he does not use drugs.   Family History:  The patient's family history includes Diabetes in his father and mother; Hyperlipidemia in his father; Hypertension in his father and mother.   ROS:  Please see the history of present illness.   All other systems are personally reviewed and negative.   Exam:    BP 111/74   Pulse 92   Wt 74.7 kg   SpO2 90%   BMI 23.62 kg/m  General:  NAD. No resp difficulty HEENT: Normal Neck: Supple. No JVD.  Carotids 2+ bilat; no bruits. No lymphadenopathy or thryomegaly appreciated. Cor: PMI nondisplaced. Regular rate & rhythm. No rubs, gallops or murmurs. Lungs: Clear Abdomen: Soft, nontender, nondistended. No hepatosplenomegaly. No bruits or masses. Good bowel sounds. Extremities: No  cyanosis, clubbing, rash, edema Neuro: alert & oriented x 3, cranial nerves grossly intact. Moves all 4 extremities w/o difficulty. Affect pleasant.  Recent Labs: 04/27/2020: B Natriuretic Peptide 116.6 07/07/2020: ALT 11 07/21/2020: Platelets 183 07/29/2020: BUN 30; Creatinine, Ser 1.40; Hemoglobin 11.6; Hemoglobin 11.2; Potassium 4.2; Potassium 4.0; Sodium 143; Sodium 144  Personally reviewed   Wt Readings from Last 3 Encounters:  08/13/20 74.7 kg  07/29/20 72.6 kg  07/21/20 71.4 kg    ASSESSMENT AND PLAN:  1. Chronic systolic CHF: Ischemic cardiomyopathy. Medtronic ICD single chamber. 2/19 admission for decompensated HF requiring milrinone. Echo 2/7/019 EF 15% Mildly dilated RV. CPX in 2/19 was suggestive of severe limitation due to HF.   RHC in 4/19 showed normal filling pressures and preserved cardiac output.  Echo in 1/20 with EF 20%.  He had a Barostimulator device placed in 1/20.  RHC in 6/20 showed normal filling pressures, CI 2.29.  Echo 3/21 with EF 20-25%.  R/LHC (1/22): RA mean 1, RV 48/1, PA 46/12 (26), PCWP mean 7, CO 4.46, CI 2.34, PVR 4.2 WU. Symptomatically better, NYHA class IIIb.  Fluid up on OptiVol, weight up in setting of dietary indiscretion. - Increase lasix to 40 mg bid x 2 days, then lasix 40 mg daily (previously on 20 mg after RHC with low filling pressures). BMET today and repeat in 1-2 weeks. Will ask device rep to do remote transmission next week to ensure fluid levels trending down. - Continue spironolactone 12.5 mg daily. - Continue carvedilol to 3.125 mg bid. - Continue digoxin 0.0625 mg daily, level ok today.    - Continue Entresto 24/26 mg bid. Will not increase today with recent  dizziness & for better renal perfusion.     - Has IVCD, not LBBB-like.  Saw Dr. Caryl Comes, will not upgrade ICD to CRT.  - With significant ILD,, I do not think that he would be an LVAD candidate.   - BMET and digoxin level today. 2. Afib/ Atrial flutter: Paroxysmal.  Admitted in 2/19 with severe HF decompensation in setting of atrial flutter with RVR.  He was cardioverted.  He then had atrial flutter ablation in 3/19. He is off amiodarone due to question of amiodarone lung toxicity. No palpitations.  Irregular on exam but NSR today on ECG.  - He is now off amiodarone (question of lung toxicity, would not re-challenge in future).   - Continue Eliquis 5 mg bid. Denies bleeding.  3. CAD: S/p CABG.  Last intervention was PCI to LCx in 2016. Repeat cath (1/22) with occluded SVG-OM and SVG-RCA with patent LIMA-LAD and SVG-D, occluded proximal RCA, and new occluded mid LCX. No targets for revascularization. - Continue atorvastatin, good lipids in 12/21.   - Triglycerides improved on Vascepa 2 g bid.  - No ASA with Eliquis use.  4. Pulmonary fibrosis: PFTs in 2/19 were restrictive, and high resolution CT showed interstitial lung disease. Amiodarone was stopped due to concern for possible toxicity.  He has seen [name] => bronchoscopy suggestive of ILD related to amiodarone.  He has been on chronic prednisone.  High resolution CT chest repeated in 6/20, again showing ILD, described at UIP pattern. He has started Ofev. He needs to wear oxygen at all times. - Continue Ofev.    - Continue prednisone 10 mg daily.  - Continue pulmonary followup.  - Needs to wear oxygen at all times. 5. OSA: Continue CPAP.    6. CKD: Stage 3.  - Followup with nephrology.  - Check  BMET today. 7. Pulmonary hypertension: Suspect group 3 PH due to lung disease (ILD). RHC (1/22) with moderate pulmonary arterial hypertension, PVR 4.2 WU.  - Has not started on Tyvaso yet, PA completed and processing. 8. DM2: He did not tolerate  [place].   Followup 1-2 months with Dr. [name]  Signed, [place], FNP-BC 08/13/20

## 2020-08-13 NOTE — Telephone Encounter (Signed)
Malena Edman, RN  08/13/2020 4:09 PM EST Back to Top     Patient advised and verbalized understanding. Lab appointment schedule. Lab orders placed

## 2020-08-16 ENCOUNTER — Other Ambulatory Visit (HOSPITAL_COMMUNITY): Payer: Self-pay | Admitting: Cardiology

## 2020-08-16 MED FILL — ELIQUIS 5 MG TABLET: 5 | 90 days supply | Qty: 180 | Fill #0

## 2020-08-17 ENCOUNTER — Other Ambulatory Visit (HOSPITAL_COMMUNITY): Payer: Self-pay | Admitting: Cardiology

## 2020-08-17 MED FILL — DIGOXIN 0.125 MG TABLET: 125 | 90 days supply | Qty: 45 | Fill #0

## 2020-08-20 ENCOUNTER — Other Ambulatory Visit: Payer: Self-pay

## 2020-08-20 ENCOUNTER — Encounter: Payer: Self-pay | Admitting: Sports Medicine

## 2020-08-20 ENCOUNTER — Encounter (HOSPITAL_COMMUNITY): Payer: Medicare HMO

## 2020-08-20 ENCOUNTER — Ambulatory Visit (INDEPENDENT_AMBULATORY_CARE_PROVIDER_SITE_OTHER): Payer: Medicare HMO | Admitting: Sports Medicine

## 2020-08-20 DIAGNOSIS — M1A9XX1 Chronic gout, unspecified, with tophus (tophi): Secondary | ICD-10-CM

## 2020-08-20 MED ORDER — ALLOPURINOL 300 MG PO TABS
300.0000 mg | ORAL_TABLET | Freq: Every day | ORAL | 6 refills | Status: DC
  Filled 2021-02-22: qty 30, 30d supply, fill #0

## 2020-08-20 NOTE — Progress Notes (Signed)
Patient usually uses O2 but did not bring it with him today. Due to his O2 sat being so low, placed him on O2 at 3L.

## 2020-08-20 NOTE — Telephone Encounter (Signed)
Patient approved to start Tyvaso therapy. Will receive through [place].   Audry Riles, PharmD, BCPS, BCCP, CPP Heart Failure Clinic Pharmacist [phone]

## 2020-08-20 NOTE — Assessment & Plan Note (Signed)
Olecranon bursitis noted at a previous visit, we added uric acid levels. 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 They were high, starting allopurinol 300 daily, he will do this daily for the next month and then can return to the lab for uric acid and a CMP. Of note he forgot his oxygen today, he will restart this when he gets home.

## 2020-08-20 NOTE — Progress Notes (Signed)
Procedures performed today:    None.  Independent interpretation of notes and tests performed by another provider:   None.  Brief History, Exam, Impression, and Recommendations:    Gout Olecranon bursitis noted at a previous visit, we added uric acid levels. 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 They were high, starting allopurinol 300 daily, he will do this daily for the next month and then can return to the lab for uric acid and a CMP. Of note he forgot his oxygen today, he will restart this when he gets home.    ___________________________________________ Gwen Her. Dianah Field, M.D., ABFM., CAQSM. Primary Care and [place] Instructor of [employer] [facility]

## 2020-08-23 ENCOUNTER — Other Ambulatory Visit (HOSPITAL_COMMUNITY): Payer: Medicare HMO

## 2020-08-23 MED FILL — ENTRESTO 24 MG-26 MG TABLET: 24-26 | 30 days supply | Qty: 60 | Fill #4

## 2020-08-24 ENCOUNTER — Other Ambulatory Visit: Payer: Self-pay

## 2020-08-24 ENCOUNTER — Ambulatory Visit (HOSPITAL_COMMUNITY)
Admission: RE | Admit: 2020-08-24 | Discharge: 2020-08-24 | Disposition: A | Discharge status: Home / Self Care | Payer: Medicare HMO | Source: Ambulatory Visit | Attending: Cardiology | Admitting: Cardiology

## 2020-08-24 DIAGNOSIS — I5022 Chronic systolic (congestive) heart failure: Secondary | ICD-10-CM

## 2020-08-24 LAB — BASIC METABOLIC PANEL
Anion gap: 13 (ref 5–15)
BUN: 35 mg/dL — ABNORMAL HIGH (ref 6–20)
CO2: 28 mmol/L (ref 22–32)
Calcium: 9.8 mg/dL (ref 8.9–10.3)
Chloride: 99 mmol/L (ref 98–111)
Creatinine, Ser: 1.88 mg/dL — ABNORMAL HIGH (ref 0.61–1.24)
GFR, Estimated: 41 mL/min — ABNORMAL LOW (ref >60)
Glucose, Bld: 121 mg/dL — ABNORMAL HIGH (ref 70–99)
Potassium: 5.2 mmol/L — ABNORMAL HIGH (ref 3.5–5.1)
Sodium: 140 mmol/L (ref 135–145)

## 2020-08-25 ENCOUNTER — Telehealth (HOSPITAL_COMMUNITY): Payer: Self-pay | Admitting: Cardiology

## 2020-08-25 DIAGNOSIS — I5022 Chronic systolic (congestive) heart failure: Secondary | ICD-10-CM

## 2020-08-25 NOTE — Telephone Encounter (Signed)
-----  Message from Rafael Bihari, [place] sent at 08/24/2020  2:15 PM EST ----- Kidney function slightly worse from baseline & potassium elevated. Please hold spiro x 2 days, then may restart. Will need repeat BMET Friday

## 2020-08-25 NOTE — Telephone Encounter (Signed)
Pt aware and voiced understanding repeat labs 2/28

## 2020-08-27 DIAGNOSIS — I2723 Pulmonary hypertension due to lung diseases and hypoxia: Secondary | ICD-10-CM | POA: Diagnosis not present

## 2020-08-27 DIAGNOSIS — J84112 Idiopathic pulmonary fibrosis: Secondary | ICD-10-CM | POA: Diagnosis not present

## 2020-08-30 ENCOUNTER — Telehealth (HOSPITAL_COMMUNITY): Payer: Self-pay

## 2020-08-30 ENCOUNTER — Ambulatory Visit (HOSPITAL_COMMUNITY)
Admission: RE | Admit: 2020-08-30 | Discharge: 2020-08-30 | Disposition: A | Discharge status: Home / Self Care | Payer: Medicare HMO | Source: Ambulatory Visit | Attending: Internal Medicine | Admitting: Internal Medicine

## 2020-08-30 ENCOUNTER — Other Ambulatory Visit: Payer: Self-pay

## 2020-08-30 DIAGNOSIS — I5022 Chronic systolic (congestive) heart failure: Secondary | ICD-10-CM | POA: Diagnosis not present

## 2020-08-30 DIAGNOSIS — I255 Ischemic cardiomyopathy: Secondary | ICD-10-CM

## 2020-08-30 LAB — BASIC METABOLIC PANEL
Anion gap: 14 (ref 5–15)
BUN: 36 mg/dL — ABNORMAL HIGH (ref 6–20)
CO2: 24 mmol/L (ref 22–32)
Calcium: 9.1 mg/dL (ref 8.9–10.3)
Chloride: 100 mmol/L (ref 98–111)
Creatinine, Ser: 1.75 mg/dL — ABNORMAL HIGH (ref 0.61–1.24)
GFR, Estimated: 44 mL/min — ABNORMAL LOW (ref >60)
Glucose, Bld: 159 mg/dL — ABNORMAL HIGH (ref 70–99)
Potassium: 4.3 mmol/L (ref 3.5–5.1)
Sodium: 138 mmol/L (ref 135–145)

## 2020-08-30 LAB — DIGOXIN LEVEL: Digoxin Level: 0.4 ng/mL — ABNORMAL LOW (ref 0.8–2.0)

## 2020-08-30 NOTE — Telephone Encounter (Signed)
Malena Edman, RN  08/30/2020 4:02 PM EST Back to Top     Patient advised and verbalized understanding. Patient will call back to schedule lab appointment

## 2020-08-30 NOTE — Telephone Encounter (Signed)
-----  Message from Rafael Bihari, [place] sent at 08/30/2020  3:58 PM EST ----- Potassium better, kidney function stable. Will repeat BMET in 4 weeks to ensure kidney function continues to go back to baseline.

## 2020-08-31 DIAGNOSIS — J84112 Idiopathic pulmonary fibrosis: Secondary | ICD-10-CM | POA: Diagnosis not present

## 2020-08-31 DIAGNOSIS — I2723 Pulmonary hypertension due to lung diseases and hypoxia: Secondary | ICD-10-CM | POA: Diagnosis not present

## 2020-09-02 DIAGNOSIS — I2510 Atherosclerotic heart disease of native coronary artery without angina pectoris: Secondary | ICD-10-CM | POA: Diagnosis not present

## 2020-09-02 DIAGNOSIS — I509 Heart failure, unspecified: Secondary | ICD-10-CM | POA: Diagnosis not present

## 2020-09-06 ENCOUNTER — Other Ambulatory Visit (HOSPITAL_COMMUNITY): Payer: Self-pay | Admitting: Cardiology

## 2020-09-06 DIAGNOSIS — J849 Interstitial pulmonary disease, unspecified: Secondary | ICD-10-CM | POA: Diagnosis not present

## 2020-09-06 DIAGNOSIS — G4733 Obstructive sleep apnea (adult) (pediatric): Secondary | ICD-10-CM | POA: Diagnosis not present

## 2020-09-06 DIAGNOSIS — J9611 Chronic respiratory failure with hypoxia: Secondary | ICD-10-CM | POA: Diagnosis not present

## 2020-09-06 MED FILL — SPIRONOLACTONE 25 MG TABS: 25 | 90 days supply | Qty: 90 | Fill #3

## 2020-09-06 MED FILL — FUROSEMIDE 40 MG TAB: 40 | 30 days supply | Qty: 30 | Fill #3

## 2020-09-07 ENCOUNTER — Other Ambulatory Visit (HOSPITAL_COMMUNITY): Payer: Self-pay | Admitting: Cardiology

## 2020-09-07 MED FILL — predniSONE 10 MG TABS: 10 | 30 days supply | Qty: 30 | Fill #0

## 2020-09-08 ENCOUNTER — Telehealth: Payer: Self-pay | Admitting: Internal Medicine

## 2020-09-08 NOTE — Telephone Encounter (Signed)
Called [facility] and spoke with Santiago Glad. Santiago Glad stated they have been unable to reach Patient to schedule Ofev shipment.  Per previous encounters patient is recommended to continue Ofev. Called and spoke with Patient. Patient stated he is still taking Ofev. [facility] contact number given to Patient to schedule next Ofev delivery. Nothing further at this time.

## 2020-09-08 NOTE — Telephone Encounter (Signed)
[facility] to [facility], spoke with pharmacy. He still has 2 refills on his account. States the patient needs to call in order to get the refill.   Call made to patient, made aware he just needs to call. Number given.   Nothing further needed at this time.

## 2020-09-14 DIAGNOSIS — J84112 Idiopathic pulmonary fibrosis: Secondary | ICD-10-CM | POA: Diagnosis not present

## 2020-09-14 DIAGNOSIS — I2723 Pulmonary hypertension due to lung diseases and hypoxia: Secondary | ICD-10-CM | POA: Diagnosis not present

## 2020-09-22 ENCOUNTER — Other Ambulatory Visit: Payer: Self-pay | Admitting: Internal Medicine

## 2020-09-23 MED FILL — ENTRESTO 24 MG-26 MG TABLET: 24-26 | 30 days supply | Qty: 60 | Fill #5

## 2020-09-24 ENCOUNTER — Telehealth (HOSPITAL_COMMUNITY): Payer: Self-pay | Admitting: *Deleted

## 2020-09-24 DIAGNOSIS — I2723 Pulmonary hypertension due to lung diseases and hypoxia: Secondary | ICD-10-CM | POA: Diagnosis not present

## 2020-09-24 DIAGNOSIS — J84112 Idiopathic pulmonary fibrosis: Secondary | ICD-10-CM | POA: Diagnosis not present

## 2020-09-24 NOTE — Telephone Encounter (Signed)
He should get an ECG to make sure not in afib.

## 2020-09-24 NOTE — Telephone Encounter (Signed)
Spoke with pt and scheduled ekg for Monday. Pt said he was told by specialty pharmacy RN to stop tyvaso two days ago. Pt said since stopping the medication he feels better.

## 2020-09-24 NOTE — Telephone Encounter (Signed)
Routed to [place]  See note from [place] below:      I got a message from [facility] Nurse today regarding [name] He is one of Dr. Claris Gladden patients. I saw [name] today for his "second" visit. He has cancelled 2 times. This should be his week 4 but we are only on week 2. He cancelled last week with no explanation other than he did not feel well. I visited with him today, 3/22, and she stated that last week he was getting out of the shower, got light headed and caught his foot in his underwear and fell. He is dealing with extreme pain in his tail bone and just did not want to see me last week. He agreed to see me today and was quite miserable. The Tyvaso is giving him a great deal of anxiety due to chest tightness and burning after every treatment. I had taken him up to 4 breaths and he took himself back down to 3 on Saturday, 3/19. (He did not notify anyone of this change.) He states that he is still dealing with the burning and tightness. When he started on Tyvaso his heart rate went up very high. He continued to monitor the heart rate and told me that it had been getting better. Today, 4 hours after his treatment his heart rate was still 120 bpm. We discussed his options and right now he would like to hold his treatments and have [place] contact Dr. [name] to see what he advises. Blood pressure was 122/73, heart rate 120.would you mind forwarding to Dr. [name] to see what his instructions are?

## 2020-09-27 ENCOUNTER — Encounter (HOSPITAL_COMMUNITY): Payer: Medicare HMO

## 2020-10-01 DIAGNOSIS — J84112 Idiopathic pulmonary fibrosis: Secondary | ICD-10-CM | POA: Diagnosis not present

## 2020-10-01 DIAGNOSIS — I2723 Pulmonary hypertension due to lung diseases and hypoxia: Secondary | ICD-10-CM | POA: Diagnosis not present

## 2020-10-02 ENCOUNTER — Other Ambulatory Visit (HOSPITAL_COMMUNITY): Payer: Self-pay

## 2020-10-02 MED FILL — Carvedilol Tab 25 MG: ORAL | 90 days supply | Qty: 180 | Fill #0 | Status: AC

## 2020-10-03 DIAGNOSIS — I2510 Atherosclerotic heart disease of native coronary artery without angina pectoris: Secondary | ICD-10-CM | POA: Diagnosis not present

## 2020-10-03 DIAGNOSIS — I509 Heart failure, unspecified: Secondary | ICD-10-CM | POA: Diagnosis not present

## 2020-10-04 ENCOUNTER — Other Ambulatory Visit (HOSPITAL_COMMUNITY): Payer: Self-pay

## 2020-10-07 DIAGNOSIS — J849 Interstitial pulmonary disease, unspecified: Secondary | ICD-10-CM | POA: Diagnosis not present

## 2020-10-07 DIAGNOSIS — G4733 Obstructive sleep apnea (adult) (pediatric): Secondary | ICD-10-CM | POA: Diagnosis not present

## 2020-10-07 DIAGNOSIS — J9611 Chronic respiratory failure with hypoxia: Secondary | ICD-10-CM | POA: Diagnosis not present

## 2020-10-11 ENCOUNTER — Other Ambulatory Visit (HOSPITAL_COMMUNITY): Payer: Self-pay

## 2020-10-11 ENCOUNTER — Other Ambulatory Visit (HOSPITAL_COMMUNITY): Payer: Self-pay | Admitting: Cardiology

## 2020-10-11 MED FILL — Furosemide Tab 40 MG: ORAL | 30 days supply | Qty: 30 | Fill #0 | Status: AC

## 2020-10-12 ENCOUNTER — Other Ambulatory Visit (HOSPITAL_COMMUNITY): Payer: Self-pay

## 2020-10-12 MED ORDER — PREDNISONE 10 MG PO TABS
10.0000 mg | ORAL_TABLET | Freq: Every day | ORAL | 3 refills | Status: DC
  Filled 2020-10-12: qty 30, 30d supply, fill #0
  Filled 2020-11-11: qty 30, 30d supply, fill #1
  Filled 2020-12-27: qty 30, 30d supply, fill #2
  Filled 2021-01-24: qty 30, 30d supply, fill #3

## 2020-10-13 ENCOUNTER — Other Ambulatory Visit (HOSPITAL_COMMUNITY): Payer: Self-pay

## 2020-10-15 DIAGNOSIS — J84112 Idiopathic pulmonary fibrosis: Secondary | ICD-10-CM | POA: Diagnosis not present

## 2020-10-15 DIAGNOSIS — I2723 Pulmonary hypertension due to lung diseases and hypoxia: Secondary | ICD-10-CM | POA: Diagnosis not present

## 2020-10-20 ENCOUNTER — Other Ambulatory Visit (HOSPITAL_COMMUNITY): Payer: Self-pay

## 2020-10-20 ENCOUNTER — Other Ambulatory Visit: Payer: Self-pay | Admitting: Internal Medicine

## 2020-10-20 MED ORDER — METFORMIN HCL 500 MG PO TABS
500.0000 mg | ORAL_TABLET | Freq: Two times a day (BID) | ORAL | 1 refills | Status: AC
  Filled 2020-10-20: qty 180, 90d supply, fill #0
  Filled 2021-01-24: qty 180, 90d supply, fill #1

## 2020-10-21 ENCOUNTER — Other Ambulatory Visit (HOSPITAL_COMMUNITY): Payer: Self-pay

## 2020-10-25 ENCOUNTER — Other Ambulatory Visit (HOSPITAL_COMMUNITY): Payer: Self-pay

## 2020-10-25 MED FILL — Sacubitril-Valsartan Tab 24-26 MG: ORAL | 30 days supply | Qty: 60 | Fill #0 | Status: AC

## 2020-11-02 ENCOUNTER — Telehealth: Payer: Self-pay | Admitting: Internal Medicine

## 2020-11-02 DIAGNOSIS — I2510 Atherosclerotic heart disease of native coronary artery without angina pectoris: Secondary | ICD-10-CM | POA: Diagnosis not present

## 2020-11-02 DIAGNOSIS — I509 Heart failure, unspecified: Secondary | ICD-10-CM | POA: Diagnosis not present

## 2020-11-02 NOTE — Telephone Encounter (Signed)
Called and spoke with patient, made him aware that [facility] to reach [facility] regarding his Ofev refill and setting up a time to have it delivered.  He verified understanding and stated he would give them a call.    Called and spoke with Margreta Journey with [facility] regarding [facility].  Advised that I had called patient and let him know that had been attempting to reach him regarding getting a refill of his Ofev.  Verified that he still currently has 5 refills of the Ofev.  Nothing further needed.

## 2020-11-06 DIAGNOSIS — J849 Interstitial pulmonary disease, unspecified: Secondary | ICD-10-CM | POA: Diagnosis not present

## 2020-11-06 DIAGNOSIS — J9611 Chronic respiratory failure with hypoxia: Secondary | ICD-10-CM | POA: Diagnosis not present

## 2020-11-06 DIAGNOSIS — G4733 Obstructive sleep apnea (adult) (pediatric): Secondary | ICD-10-CM | POA: Diagnosis not present

## 2020-11-11 ENCOUNTER — Other Ambulatory Visit (HOSPITAL_COMMUNITY): Payer: Self-pay

## 2020-11-11 MED FILL — Digoxin Tab 125 MCG (0.125 MG): ORAL | 90 days supply | Qty: 45 | Fill #0 | Status: AC

## 2020-11-12 ENCOUNTER — Other Ambulatory Visit (HOSPITAL_COMMUNITY): Payer: Self-pay

## 2020-11-16 ENCOUNTER — Other Ambulatory Visit (HOSPITAL_COMMUNITY): Payer: Self-pay

## 2020-11-23 ENCOUNTER — Encounter (HOSPITAL_COMMUNITY): Payer: Medicare HMO | Admitting: Cardiology

## 2020-11-24 ENCOUNTER — Other Ambulatory Visit (HOSPITAL_COMMUNITY): Payer: Self-pay

## 2020-11-24 MED FILL — Sacubitril-Valsartan Tab 24-26 MG: ORAL | 30 days supply | Qty: 60 | Fill #1 | Status: AC

## 2020-11-24 MED FILL — Apixaban Tab 5 MG: ORAL | 90 days supply | Qty: 180 | Fill #0 | Status: AC

## 2020-11-26 ENCOUNTER — Ambulatory Visit: Payer: Medicare HMO | Admitting: Primary Care

## 2020-11-26 ENCOUNTER — Encounter: Payer: Self-pay | Admitting: Primary Care

## 2020-11-26 ENCOUNTER — Telehealth: Payer: Self-pay

## 2020-11-26 ENCOUNTER — Other Ambulatory Visit: Payer: Self-pay

## 2020-11-26 VITALS — BP 116/74 | HR 89 | Temp 98.0°F | Ht 69.0 in | Wt 157.2 lb

## 2020-11-26 DIAGNOSIS — J9611 Chronic respiratory failure with hypoxia: Secondary | ICD-10-CM | POA: Diagnosis not present

## 2020-11-26 DIAGNOSIS — J849 Interstitial pulmonary disease, unspecified: Secondary | ICD-10-CM | POA: Diagnosis not present

## 2020-11-26 MED ORDER — MOMETASONE FURO-FORMOTEROL FUM 200-5 MCG/ACT IN AERO: 2.0000 | INHALATION_SPRAY | Freq: Two times a day (BID) | RESPIRATORY_TRACT | 11 refills | Status: DC

## 2020-11-26 MED ORDER — ADVAIR HFA 230-21 MCG/ACT IN AERO: 2.0000 | INHALATION_SPRAY | Freq: Two times a day (BID) | RESPIRATORY_TRACT | 12 refills | Status: AC

## 2020-11-26 NOTE — Assessment & Plan Note (Addendum)
-  Overdue for follow-up. Last seen in July 2021. Patient reports worsening shortness of breath with associated fatigue/weakness. He is compliant with OFEV 188m BID. No significant cough or GI symptoms. ILD symptom scale 31.5 (previously 24). He is now using oxygen more frequently, currently on 4L/min. Recommend resuming ICS/LABA, Dulera 200 not formulary so sending in Rx for Advair Hfa 230-262m. Continue prednisone 1071maily. Sent clinical pharmacist message about renewing OFEV order with specialty pharmacy. Needs CMET today and ordered for spirometry with DLCO and 6MWT. FU in July with Dr. [name]0 min slot.

## 2020-11-26 NOTE — Progress Notes (Signed)
_0  ID: Vincent Chandler, male    DOB: 25-Nov-1960, 60 y.o.   MRN: [id number]  Chief Complaint  Patient presents with  . Follow-up    Issue with stamina and weakness    Referring provider: Ann Held, *  HPI: 60 year old male, never smoked.  Past medical history significant for interstitial lung disease, obstructive sleep apnea, congestive heart failure, hypertension, and NSTEMI, A. Fib (hx amiodarone use), chronic periodontitis, type 2 diabetes, stage 3 chronic kidney disease, hyperlipemia. Patient of [name], last seen on 02/20/19. 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 June 2020 found to have new exertional dyspnea. Dr [name] did cath - PCWP 12 and lasix reduced (Creat 2 with GFR 50s now. Home sleep test showed severe sleep apnea in June 2020, started on CPAP auto titrate 5-15cm h20. Maintained on Dulera, albuterol as needed. Pulmonary fibrosis felt to have progressed despite stopping amiodarone and prednisone therapy. Plan reduce prednisone to 29m daily, started on OFEV 1045mtwice daily. Needs LFTS and BMET at follow-up  Previous [facility] encounter:  04/07/2019 Patient presents today for office visit to qualify for POC/[facility]ogen. He has not received OFEV medication. Breathing is ok, baseline. He has difficulty with shortness of breath without oxygen. Associated weakness. No change in cough, mainly dry. States Dr. McAundra Dubinncreased his prednisone back up to 1047maily. Right flank pain and rash started 3 days ago. He has an apt with PCP in 2 days. Living with friend, not currently at old address. Needs LFTs and BMET after starting OFEV.    01/27/2020  - Visit  [age]ar old male current smoker followed in our office for interstitial lung disease, obstructive sleep apnea as well as chronic respiratory failure.  Patient was last seen in our office in October/2020 by [name].  It was recommended at that time that he continue forward with starting antifibrotic's Ofev 100 mg twice  daily, follow-up in 6 weeks with Dr. RamChase CallerContinue oxygen therapy, needing 3 L pulsed with exertion, and continue CPAP therapy.   Patient presenting to office today.  Patient refused to be walked in our office today due to ongoing foot and lower extremity wounds.  CPAP compliance report attempted to be obtained.  Per [facility] patient last used in September/2020.  Patient reporting today that he uses every other night.  We have contacted patient's DME company [employer] who reports the patient does not use his CPAP and many months.  Patient denies this.  Patient reporting that he has had acute worsening shortness of breath.  He reports that now he is on his oxygen 24/7.  He reports that this is acutely worsened.  He has placed himself on oxygen 24/7 due to acute worsening symptoms of dyspnea.  This has not been due to recorded oxygen levels below 88%.  He reports that occasionally when he is sitting in bed his oxygen levels are below 88%.  We will discuss and review this today.    Patient remains adherent to Ofev.  He never completed repeat lab work with Ofev start.  We will discuss and review this today.  Patient remains adherent to daily prednisone managed by cardiology.  Patient has not had repeat lung functioning since 2020.  May need to consider repeat  chest imaging.  Patient also needs follow-up with Dr. RamChase CallerWe will discuss and coordinate today.  11/26/2020 - interim hx  Patient presents today for overdue follow-up. He was last seen July 2021 by pulmonary NP and was recommended to  follow-up in 2 months. Breathing is worse over the last 3-4 month. Increased fatigue and more weakness in legs. No significant cough. He has been consistently taking his OFEV, he only has 3 days left of medication. Needs OFEV renewed. He has not been on Dulera severals months ago, tells me he just stopped taking it. He is on prednisone 42m daily.  He is using 4L oxygen. He takes lasix 485mdaily. Weight has  been stable. No diarrhea, appetite is ok. Overdue for spiro with DLCO, 6MWT and CMET.    SYMPTOM SCALE - ILD 02/20/2019  11/26/2020   O2 use ra + occ prn ex o2 3-4L   Shortness of Breath 0 -> 5 scale with 5 being worst (score 6 If unable to do) 0 -> 5 scale with 5 being worst (score 6 If unable to do)  At rest 1 2.5  Simple tasks - showers, clothes change, eating, shaving 2 3.5  Household (dishes, doing bed, laundry) 3 5  Shopping 3 5  Walking level at own pace 3 3  Walking keeping up with others of same age 37 3.5  Walking up Stairs 4 5  Walking up Hill 4 4  Total (40 - 48) Dyspnea Score 24 31.5  How bad is your cough? 2 1  How bad is your fatigue 4 5      [facility] download 02/12/19-03/13/19 - Usage 12/30 days (40%); 9/30 (30%) > 4 hours - Average usage 5 hours 13 mins - Pressure 5-15cm h20 (10.4cm h20 95% percentile) - Moderate air leaks - AHI 3.2  Significant testing reviewed: ECHO TEE 08/20/17 - EF 15%  (CPST 08/24/17 - high risk CHF features with Vo2 max 12 and also desaturation  < 90%)  HRCT IMPRESSION 09/17/17- The appearance of the lungs is compatible with interstitial lung disease. The pattern is considered a probable usual interstitial pneumonia (UIP) CT pattern, however, at this time, no definitive honeycombing is identified  HRCT IMPRESSION 12/27/18- I mages demonstrate widespread areas of ground-glass attenuation, septal thickening, subpleural reticulation, thickening of the peribronchovascular interstitium, mild cylindrical traction bronchiectasis and regional areas of architectural distortion. No frank honeycombing is confidently identified at this time. Findings have a definitive craniocaudal gradient and appear minimally progressive compared to the prior study. Findings in the lungs remain compatible with interstitial lung disease, with a pattern considered probable usual interstitial pneumonia (UIP) per current ATS guidelines.    No Known Allergies  Immunization  History  Administered Date(s) Administered  . Influenza Inj Mdck Quad Pf 06/14/2018  . Influenza Split 06/05/2012  . Influenza Whole 05/09/2010  . Influenza,inj,Quad PF,6+ Mos 04/09/2013, 06/05/2014, 08/17/2017, 04/07/2019  . Influenza-Unspecified 04/03/2015  . Moderna Sars-Covid-2 Vaccination 07/29/2019, 09/02/2019  . Pneumococcal Conjugate-13 06/05/2014  . Pneumococcal Polysaccharide-23 02/17/2012  . Td 05/09/2010, 02/07/2019    Past Medical History:  Diagnosis Date  . AICD (automatic cardioverter/defibrillator) present   . Anginal pain (H[place]  . Anxiety   . Asthma   . Atrial fibrillation-postoperative 11/28/2012  . Cardiomyopathy    alcohol use related  . CHF (congestive heart failure) (HCPrice  . Chronic back pain   . Coronary artery disease    drug eluting stent RCA 2005-EF 30%- s/p CABG x 4; 2/4 patent grafts with SVG-PLOM and SVG-RCA system totally occluded. There are collaterals from the LAD system to the RCA and the RCA is totally occluded proximally. There are no collaterals to the LCx territory. He then underwent successful PCI of the mid left circumflex artery  with overlapping Synergy drug-eluting stents  . Depression    pt denies  . Diabetes mellitus type II dx'd in the 1990's  . GERD (gastroesophageal reflux disease)    yrs ago  . H/O hiatal hernia   . Hemorrhoids   . History of hypogonadism   . History of kidney stones   . Hyperlipidemia   . Hypertension   . OSA on CPAP    "mask is broken; working on getting a new one" (01/15/2015; 10/03/2017)  . Pneumonia    "3-4 times" (10/03/2017)  . Urinary incontinence     Tobacco History: Social History   Tobacco Use  Smoking Status Never Smoker  Smokeless Tobacco Current User  . Types: Chew   Ready to quit: Not Answered Counseling given: Not Answered   Outpatient Medications Prior to Visit  Medication Sig Dispense Refill  . ACCU-CHEK FASTCLIX LANCETS MISC Use as directed once a day.  Dx code: E11.9 100 each 1   . albuterol (ACCUNEB) 0.63 MG/3ML nebulizer solution Take 3 mLs (0.63 mg total) by nebulization every 6 (six) hours as needed for wheezing. 75 mL 12  . albuterol (VENTOLIN HFA) 108 (90 Base) MCG/ACT inhaler Inhale 2 puffs into the lungs every 6 (six) hours as needed for wheezing or shortness of breath.     . allopurinol (ZYLOPRIM) 300 MG tablet Take 1 tablet (300 mg total) by mouth daily. 30 tablet 6  . apixaban (ELIQUIS) 5 MG TABS tablet TAKE 1 TABLET BY MOUTH 2 TIMES DAILY. 180 tablet 3  . atorvastatin (LIPITOR) 40 MG tablet TAKE 1 TABLET BY MOUTH AT BEDTIME. (Patient taking differently: Take 40 mg by mouth daily.) 30 tablet 3  . blood glucose meter kit and supplies Dispense based on patient and insurance preference. Use as directed once a day. Dx Code E11.9. 1 each 0  . Blood Glucose Monitoring Suppl (ACCU-CHEK GUIDE) w/Device KIT 1 each by Does not apply route daily. DX Code: E11.9 1 kit 0  . carvedilol (COREG) 25 MG tablet TAKE 1 TABLET BY MOUTH TWICE DAILY. 180 tablet 3  . carvedilol (COREG) 3.125 MG tablet Take 1 tablet (3.125 mg total) by mouth 2 (two) times daily. 180 tablet 3  . Continuous Blood Gluc Sensor (FREESTYLE LIBRE 2 SENSOR) MISC 1 Device by Does not apply route as directed. 2 each 11  . digoxin (LANOXIN) 0.125 MG tablet TAKE 1/2 TABLET BY MOUTH DAILY. 45 tablet 3  . furosemide (LASIX) 40 MG tablet Take 0.5 tablets (20 mg total) by mouth daily. 30 tablet 6  . furosemide (LASIX) 40 MG tablet Take 1 tablet (40 mg total) by mouth daily. 30 tablet 6  . glucose blood (ACCU-CHEK GUIDE) test strip Use as directed once a day.  Dx code: E11.9 100 each 1  . guaiFENesin (MUCINEX) 600 MG 12 hr tablet Take 1 tablet (600 mg total) by mouth 2 (two) times daily as needed for to loosen phlegm.    Marland Kitchen icosapent Ethyl (VASCEPA) 1 g capsule Take 2 capsules (2 g total) by mouth 2 (two) times daily. 120 capsule 6  . metFORMIN (GLUCOPHAGE) 500 MG tablet Take 1 tablet (500 mg total) by mouth 2 (two) times  daily with a meal. 180 tablet 1  . nitroGLYCERIN (NITROSTAT) 0.4 MG SL tablet Place 1 tablet (0.4 mg total) under the tongue every 5 (five) minutes as needed for chest pain. 25 tablet 3  . OFEV 150 MG CAPS TAKE 1 CAPSULE BY MOUTH TWICE DAILY WITH FOOD. CALL [phone]  FOR REFILLS 60 capsule 5  . OXYGEN Inhale 3 L into the lungs daily as needed (low oxygen).    . predniSONE (DELTASONE) 10 MG tablet TAKE 1 TABLET (10 MG TOTAL) BY MOUTH DAILY WITH BREAKFAST. 30 tablet 3  . sacubitril-valsartan (ENTRESTO) 24-26 MG TAKE 1 TABLET BY MOUTH 2 (TWO) TIMES DAILY. 60 tablet 11  . spironolactone (ALDACTONE) 25 MG tablet Take 0.5 tablets (12.5 mg total) by mouth daily. 45 tablet 3  . mometasone-formoterol (DULERA) 200-5 MCG/ACT AERO Inhale 2 puffs into the lungs 2 (two) times a day. (Patient not taking: Reported on 11/26/2020) 2 Inhaler 0   No facility-administered medications prior to visit.    Review of Systems  Review of Systems  Constitutional: Positive for fatigue.  HENT: Negative.   Respiratory: Positive for shortness of breath. Negative for cough, chest tightness and wheezing.   Cardiovascular: Negative.   Gastrointestinal: Negative.   Neurological: Positive for weakness.    Physical Exam  BP 116/74 (BP Location: Left Arm, Cuff Size: Normal)   Pulse 89   Temp 98 F (36.7 C) (Temporal)   Ht 5' 9" (1.753 m)   Wt 157 lb 3.2 oz (71.3 kg)   SpO2 96% Comment: 3L  BMI 23.21 kg/m  Physical Exam Constitutional:      Appearance: Normal appearance.  HENT:     Mouth/Throat:     Mouth: Mucous membranes are moist.     Pharynx: Oropharynx is clear.  Cardiovascular:     Rate and Rhythm: Normal rate and regular rhythm.  Pulmonary:     Breath sounds: Rales present. No rhonchi.     Comments: Fine rales lung bases. No rhonchi or wheezing. On 4L oxygen. Musculoskeletal:        General: Normal range of motion.  Skin:    General: Skin is warm and dry.  Neurological:     General: No focal  deficit present.     Mental Status: He is alert and oriented to person, place, and time. Mental status is at baseline.  Psychiatric:        Mood and Affect: Mood normal.        Behavior: Behavior normal.        Thought Content: Thought content normal.        Judgment: Judgment normal.      Lab Results:  CBC    Component Value Date/Time   WBC 6.9 07/21/2020 1218   RBC 3.86 (L) 07/21/2020 1218   HGB 11.6 (L) 07/29/2020 1154   HGB 11.2 (L) 07/29/2020 1154   HGB 14.3 08/21/2018 1500   HCT 34.0 (L) 07/29/2020 1154   HCT 33.0 (L) 07/29/2020 1154   HCT 41.0 08/21/2018 1500   PLT 183 07/21/2020 1218   PLT 204 08/21/2018 1500   MCV 111.9 (H) 07/21/2020 1218   MCV 110 (H) 08/21/2018 1500   MCH 37.3 (H) 07/21/2020 1218   MCHC 33.3 07/21/2020 1218   RDW 14.5 07/21/2020 1218   RDW 13.8 08/21/2018 1500   LYMPHSABS 1.1 06/09/2020 1422   LYMPHSABS 1.4 07/10/2018 1700   MONOABS 0.8 06/09/2020 1422   EOSABS 0.2 06/09/2020 1422   EOSABS 0.2 07/10/2018 1700   BASOSABS 0.0 06/09/2020 1422   BASOSABS 0.0 07/10/2018 1700    BMET    Component Value Date/Time   NA 138 08/30/2020 1430   NA 138 07/10/2018 1700   K 4.3 08/30/2020 1430   CL 100 08/30/2020 1430   CO2 24 08/30/2020 1430   GLUCOSE 159 (  H) 08/30/2020 1430   BUN 36 (H) 08/30/2020 1430   BUN 30 (H) 07/10/2018 1700   CREATININE 1.75 (H) 08/30/2020 1430   CREATININE 1.77 (H) 07/07/2020 1507   CALCIUM 9.1 08/30/2020 1430   GFRNONAA 44 (L) 08/30/2020 1430   GFRAA 36 (L) 01/26/2020 1028    BNP    Component Value Date/Time   BNP 116.6 (H) 04/27/2020 1057   BNP 78.8 07/14/2015 1547    ProBNP    Component Value Date/Time   PROBNP 731 (H) 07/12/2016 1603   PROBNP 165.0 (H) 06/16/2013 1111    Imaging: No results found.   Assessment & Plan:   ILD (interstitial lung disease) ([place]) - Overdue for follow-up. Last seen in July 2021. Patient reports worsening shortness of breath with associated fatigue/weakness. He is  compliant with OFEV 12m BID. No significant cough or GI symptoms. ILD symptom scale 31.5 (previously 24). He is now using oxygen more frequently, currently on 4L/min. Recommend resuming ICS/LABA, Dulera 200 not formulary so sending in Rx for Advair Hfa 230-273m. Continue prednisone 1049maily. Sent clinical pharmacist message about renewing OFEV order with specialty pharmacy. Needs CMET today and ordered for spirometry with DLCO and 6MWT. FU in July with Dr. [name]0 min slot.   Chronic respiratory failure with hypoxia (HCC) - Continue to use 3-4L oxygen to maintain O2 >88-90% - Needs 6MWT, recall placed as our schedule is not out for this test currently    EliMartyn EhrichP 11/26/2020

## 2020-11-26 NOTE — Telephone Encounter (Signed)
Can you please ensure that patient has a refill for Ofev to the specialty pharmacy. He only has approximately 1 week left before he runs out. He said when he called they told him they didn't have any available for him.

## 2020-11-26 NOTE — Patient Instructions (Addendum)
  Recommendations: - We will work on getting OFEV renewed and shipped to you - Ruthe Mannan does not appear to be covered on your insurance plan.  - We will send in Advair hfa 230 - take two puffs morning and evening (rinse mouth after use)  - Continue Prednisone 72m daily - Continue lasix 430mdaily  - Continue oxygen 3-4L to maintain O2 >88-90%   Orders: - Labs today (CMET-ordered) - Needs spiro with DLCO and follow-up with MR - Needs 6MWT  Follow-up - July 5th, 6th or 7th with Dr. RaChase Caller Spirometry with DLCO

## 2020-11-26 NOTE — Assessment & Plan Note (Signed)
-  Continue to use 3-4L oxygen to maintain O2 >88-90% - Needs 6MWT, recall placed as our schedule is not out for this test currently

## 2020-11-27 LAB — COMPREHENSIVE METABOLIC PANEL
AG Ratio: 1.4 (calc) (ref 1.0–2.5)
ALT: 10 U/L (ref 9–46)
AST: 14 U/L (ref 10–35)
Albumin: 3.9 g/dL (ref 3.6–5.1)
Alkaline phosphatase (APISO): 50 U/L (ref 35–144)
BUN/Creatinine Ratio: 33 (calc) — ABNORMAL HIGH (ref 6–22)
BUN: 71 mg/dL — ABNORMAL HIGH (ref 7–25)
CO2: 26 mmol/L (ref 20–32)
Calcium: 9.4 mg/dL (ref 8.6–10.3)
Chloride: 96 mmol/L — ABNORMAL LOW (ref 98–110)
Creat: 2.12 mg/dL — ABNORMAL HIGH (ref 0.70–1.33)
Globulin: 2.7 g/dL (calc) (ref 1.9–3.7)
Glucose, Bld: 77 mg/dL (ref 65–99)
Potassium: 5.3 mmol/L (ref 3.5–5.3)
Sodium: 135 mmol/L (ref 135–146)
Total Bilirubin: 0.6 mg/dL (ref 0.2–1.2)
Total Protein: 6.6 g/dL (ref 6.1–8.1)

## 2020-11-29 MED ORDER — OFEV 150 MG PO CAPS: 150.0000 mg | ORAL_CAPSULE | Freq: Two times a day (BID) | ORAL | 1 refills | Status: DC

## 2020-11-29 NOTE — Telephone Encounter (Signed)
Rx for Ofev 110m twice dialy [facility] showed normal LFTs.  CMP     Component Value Date/Time   NA 135 11/26/2020 1648   NA 138 07/10/2018 1700   K 5.3 11/26/2020 1648   CL 96 (L) 11/26/2020 1648   CO2 26 11/26/2020 1648   GLUCOSE 77 11/26/2020 1648   BUN 71 (H) 11/26/2020 1648   BUN 30 (H) 07/10/2018 1700   CREATININE 2.12 (H) 11/26/2020 1648   CALCIUM 9.4 11/26/2020 1648   PROT 6.6 11/26/2020 1648   PROT 6.2 07/10/2018 1700   ALBUMIN 3.8 06/14/2020 1402   ALBUMIN 3.8 07/10/2018 1700   AST 14 11/26/2020 1648   ALT 10 11/26/2020 1648   ALKPHOS 43 06/14/2020 1402   BILITOT 0.6 11/26/2020 1648   BILITOT 0.3 07/10/2018 1700   GFRNONAA 44 (L) 08/30/2020 1430   GFRAA 36 (L) 01/26/2020 1028

## 2020-11-29 NOTE — Addendum Note (Signed)
Addended by: Cassandria Anger on: 11/29/2020 07:41 AM   Modules accepted: Orders

## 2020-11-30 NOTE — Progress Notes (Signed)
Kidney function is some worse, he should call his PCP and see if he can get an apt with Dr. Etter Sjogren chase or APP in the next week

## 2020-11-30 NOTE — Progress Notes (Signed)
Or nephrology if he is established with them

## 2020-12-03 ENCOUNTER — Ambulatory Visit: Payer: Medicare HMO | Admitting: Podiatry

## 2020-12-03 DIAGNOSIS — I509 Heart failure, unspecified: Secondary | ICD-10-CM | POA: Diagnosis not present

## 2020-12-03 DIAGNOSIS — I2510 Atherosclerotic heart disease of native coronary artery without angina pectoris: Secondary | ICD-10-CM | POA: Diagnosis not present

## 2020-12-07 DIAGNOSIS — J9611 Chronic respiratory failure with hypoxia: Secondary | ICD-10-CM | POA: Diagnosis not present

## 2020-12-07 DIAGNOSIS — G4733 Obstructive sleep apnea (adult) (pediatric): Secondary | ICD-10-CM | POA: Diagnosis not present

## 2020-12-07 DIAGNOSIS — J849 Interstitial pulmonary disease, unspecified: Secondary | ICD-10-CM | POA: Diagnosis not present

## 2020-12-10 ENCOUNTER — Ambulatory Visit: Payer: Medicare HMO | Admitting: Podiatry

## 2020-12-13 ENCOUNTER — Ambulatory Visit: Payer: Medicare HMO | Admitting: Family Medicine

## 2020-12-14 ENCOUNTER — Other Ambulatory Visit: Payer: Self-pay

## 2020-12-14 ENCOUNTER — Ambulatory Visit (HOSPITAL_COMMUNITY)
Admission: RE | Admit: 2020-12-14 | Discharge: 2020-12-14 | Disposition: A | Discharge status: Home / Self Care | Payer: Medicare HMO | Source: Ambulatory Visit | Attending: Cardiology | Admitting: Cardiology

## 2020-12-14 ENCOUNTER — Encounter (HOSPITAL_COMMUNITY): Payer: Self-pay | Admitting: Cardiology

## 2020-12-14 VITALS — BP 89/56 | HR 99 | Wt 156.8 lb

## 2020-12-14 DIAGNOSIS — I25810 Atherosclerosis of coronary artery bypass graft(s) without angina pectoris: Secondary | ICD-10-CM | POA: Diagnosis not present

## 2020-12-14 DIAGNOSIS — G4733 Obstructive sleep apnea (adult) (pediatric): Secondary | ICD-10-CM | POA: Diagnosis not present

## 2020-12-14 DIAGNOSIS — E1122 Type 2 diabetes mellitus with diabetic chronic kidney disease: Secondary | ICD-10-CM | POA: Insufficient documentation

## 2020-12-14 DIAGNOSIS — I4892 Unspecified atrial flutter: Secondary | ICD-10-CM | POA: Diagnosis not present

## 2020-12-14 DIAGNOSIS — Z8249 Family history of ischemic heart disease and other diseases of the circulatory system: Secondary | ICD-10-CM | POA: Insufficient documentation

## 2020-12-14 DIAGNOSIS — I255 Ischemic cardiomyopathy: Secondary | ICD-10-CM | POA: Insufficient documentation

## 2020-12-14 DIAGNOSIS — Z95810 Presence of automatic (implantable) cardiac defibrillator: Secondary | ICD-10-CM | POA: Insufficient documentation

## 2020-12-14 DIAGNOSIS — Z9981 Dependence on supplemental oxygen: Secondary | ICD-10-CM | POA: Insufficient documentation

## 2020-12-14 DIAGNOSIS — M109 Gout, unspecified: Secondary | ICD-10-CM | POA: Insufficient documentation

## 2020-12-14 DIAGNOSIS — Z833 Family history of diabetes mellitus: Secondary | ICD-10-CM | POA: Insufficient documentation

## 2020-12-14 DIAGNOSIS — Z79899 Other long term (current) drug therapy: Secondary | ICD-10-CM | POA: Insufficient documentation

## 2020-12-14 DIAGNOSIS — I130 Hypertensive heart and chronic kidney disease with heart failure and stage 1 through stage 4 chronic kidney disease, or unspecified chronic kidney disease: Secondary | ICD-10-CM | POA: Diagnosis not present

## 2020-12-14 DIAGNOSIS — I5022 Chronic systolic (congestive) heart failure: Secondary | ICD-10-CM | POA: Insufficient documentation

## 2020-12-14 DIAGNOSIS — Z7951 Long term (current) use of inhaled steroids: Secondary | ICD-10-CM | POA: Diagnosis not present

## 2020-12-14 DIAGNOSIS — F17290 Nicotine dependence, other tobacco product, uncomplicated: Secondary | ICD-10-CM | POA: Diagnosis not present

## 2020-12-14 DIAGNOSIS — Z7901 Long term (current) use of anticoagulants: Secondary | ICD-10-CM | POA: Diagnosis not present

## 2020-12-14 DIAGNOSIS — N183 Chronic kidney disease, stage 3 unspecified: Secondary | ICD-10-CM | POA: Insufficient documentation

## 2020-12-14 DIAGNOSIS — J8410 Pulmonary fibrosis, unspecified: Secondary | ICD-10-CM | POA: Insufficient documentation

## 2020-12-14 DIAGNOSIS — Z7984 Long term (current) use of oral hypoglycemic drugs: Secondary | ICD-10-CM | POA: Insufficient documentation

## 2020-12-14 DIAGNOSIS — I480 Paroxysmal atrial fibrillation: Secondary | ICD-10-CM | POA: Insufficient documentation

## 2020-12-14 DIAGNOSIS — Z7952 Long term (current) use of systemic steroids: Secondary | ICD-10-CM | POA: Diagnosis not present

## 2020-12-14 DIAGNOSIS — I2510 Atherosclerotic heart disease of native coronary artery without angina pectoris: Secondary | ICD-10-CM | POA: Insufficient documentation

## 2020-12-14 LAB — BASIC METABOLIC PANEL
Anion gap: 12 (ref 5–15)
BUN: 47 mg/dL — ABNORMAL HIGH (ref 6–20)
CO2: 24 mmol/L (ref 22–32)
Calcium: 9.6 mg/dL (ref 8.9–10.3)
Chloride: 98 mmol/L (ref 98–111)
Creatinine, Ser: 1.51 mg/dL — ABNORMAL HIGH (ref 0.61–1.24)
GFR, Estimated: 53 mL/min — ABNORMAL LOW (ref >60)
Glucose, Bld: 252 mg/dL — ABNORMAL HIGH (ref 70–99)
Potassium: 5.4 mmol/L — ABNORMAL HIGH (ref 3.5–5.1)
Sodium: 134 mmol/L — ABNORMAL LOW (ref 135–145)

## 2020-12-14 LAB — BRAIN NATRIURETIC PEPTIDE: B Natriuretic Peptide: 768.9 pg/mL — ABNORMAL HIGH (ref 0.0–100.0)

## 2020-12-14 LAB — DIGOXIN LEVEL: Digoxin Level: 1.1 ng/mL (ref 0.8–2.0)

## 2020-12-14 MED ORDER — SPIRONOLACTONE 25 MG PO TABS: 12.5000 mg | ORAL_TABLET | Freq: Every day | ORAL | 3 refills | Status: DC

## 2020-12-14 MED ORDER — FUROSEMIDE 20 MG PO TABS: 20.0000 mg | ORAL_TABLET | Freq: Every day | ORAL | 3 refills | Status: DC

## 2020-12-14 NOTE — Patient Instructions (Signed)
Labs done today. We will contact you only if your labs are abnormal.  DECREASE Lasix to 6m (1 tablet) by mouth daily.  DECREASE Spironolactone to 12.543m(1/2 tablet) by mouth daily.  No other medication changes were made. Please continue all current medications as prescribed.  Please drink ensure 2 times daily.  Please wear your oxygen at all times, also please contact your oxygen company so they can provide you with a better portable machine.   You were provided a paper prescription to have a mobility scooter. You can possibly get one at [facility] address: [address] [id number]) Your physician recommends that you schedule a follow-up appointment in: 6 weeks with our [facility] here in office.    If you have any questions or concerns before your next appointment please send usKorea message through myDasselr call our office at [phone]   TO LEAVE A MESSAGE FOR THE NURSE SELECT OPTION 2, PLEASE LEAVE A MESSAGE INCLUDING: YOUR NAME DATE OF BIRTH CALL BACK NUMBER REASON FOR CALL**this is important as we prioritize the call backs  YOU WILL RECEIVE A CALL BACK THE SAME DAY AS LONG AS YOU CALL BEFORE 4:00 PM   Do the following things EVERYDAY: Weigh yourself in the morning before breakfast. Write it down and keep it in a log. Take your medicines as prescribed Eat low salt foods--Limit salt (sodium) to 2000 mg per day.  Stay as active as you can everyday Limit all fluids for the day to less than 2 liters   At [facility] and your health needs are our priority. As part of our continuing mission to provide you with exceptional heart care, we have created designated Provider Care Teams. These Care Teams include your primary Cardiologist (physician) and Advanced Practice Providers (APPs- Physician Assistants and Nurse Practitioners) who all work together to provide you with the care you need, when you need it.   You may see any  of the following providers on your designated Care Team at your next follow up: Dr DaGlori Bickersr DaHaynes KernsNP BrLyda JesterPAUtahaAudry RilesPharmD   Please be sure to bring in all your medications bottles to every appointment.

## 2020-12-14 NOTE — Progress Notes (Signed)
Advanced [facility] Failure Clinic Note  Date:  12/14/2020   PCP:  Ann Held, DO  Cardiologist: Dr. [name]   History of Present Illness: Vincent Chandler is a 60 y.o. male who has a history of A flutter, chronic systolic heart failure, CAD s/p CABG, CKD Stage III, DMII, OSA .   Admitted 08/07/17 with atrial flutter/RVR and acute on chronic systolic heart failure. Underwent successful DC/CV on 2/8. Required short term milrinone but was able to wean off. HF meds started. Echo this admission with EF 15%. D/C weight 201 pounds.   CPX in 2/19 showed severe functional impairment due to heart failure. However, PFTs were restrictive and high resolution CT chest was concerning for interstitial lung disease.    He has had atrial flutter ablation.    He had RHC in 4/19 showing preserved cardiac output and low filling pressures, but moderate pulmonary hypertension.    He saw pulmonary regarding interstitial lung disease and had bronchoscopy.  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  He has been started on prednisone, cough is much improved on prednisone.     He saw Dr. Caryl Comes, not planning to upgrade ICD to CRT given IVCD but not LBBB-like.   He had a Barostim device placed in 1/20.    I tried him on [place] but he developed orthostasis after starting it and had to stop.   He has been on prednisone for suspected amiodarone pulmonary toxicity/pulmonary fibrosis.  He has had a chronic cough.   With worsened dyspnea, I took him for RHC in 6/20.  This showed normal filling pressures and low but not markedly low cardiac output. High resolution CT chest in 6/20 showed interstitial lung disease in UIP patttern. He saw pulmonary and prednisone was increased then tapered back down.   Echo in 3/21 showed EF 20-25%, mild RV dilation with moderately decreased systolic function.   LHC/RHC in 1/22 showed low filling pressures, preserved cardiac output, moderate PAH; occluded SVG-OM  and SVG-RCA with patent LIMA-LAD and SVG-D, occluded proximal RCA and mid LCx (LCx occlusion was new).  No interventional target.  Based on cath, I suspected that dyspnea and fatigue was related mostly to his pulmonary fibrosis. I tried him on Tyvaso for PH-ILD, but he did not tolerate it (made his heart race).  Today he returns for HF follow up. He continues to feel weak/poor energy. His legs are weak and he cannot walk far.  Weight is down 8 lbs.  He is short of breath walking about 50 feet.  I suspect that dyspnea is compounded by him not using oxygen with exertion (just uses it at rest).  His oxygen saturation was low when he walked in today (without oxygen). No chest pain.  He does get lightheaded with standing at times.    MDT ICD Interrogation (personally reviewed): Fluid index > threshold in May, now < threshold.  No AF.   Labs (2/19): free T4 increased but free T3 normal, LDL 86, HDL 30 Labs (3/19): K 4.9, creatinine 1.53, hgb 14.3, LDL 52, ANA negative, RF negative Labs (4/19): K 4.2, creatinine 1.27 Labs (5/19): LDL 69, K 4.4, creatinine 1.36 Labs (8/19): K 5.3, creatinine 2.2 Labs (10/19): K 5, creatinine 1.49 Labs (12/19): hgb 13.4 Labs (2/20): K 4.2, creatinine 1.45, hgb 14.3 Labs (5/20): K 4.2, creatinine 1.81, digoxin 0.6, LDL 16, TGs 396, hgb 13.8 Labs (6/20): creatinine 2 Labs (8/20): LDL 27, Tgs 284, LFTs normal, K  4.5, creatinine 2.3.  Labs (10/20): K 4.4, creatinine 1.79, hgb 13.4 Labs (12/20): LDL 68, TGs 255 Labs (3/21): K 4.3, creatinine 2.19 Labs (6/21): K 4.4, creatinine 1.87 Labs (7/21): K 4.5, creatinine 2.2, digoxin 0.7 Labs (12/21): LDL 54, HDL 56, TGs 178 Labs (1/22): K 4.7 => 5.1, creatinine 1.77 => 2.05 Labs (5/22): K 5.3, creatinine 2.12   PMH:  1. Atrial fibrillation: Noted post-op CABG.  2. Atrial flutter: Noted during 2/19 admission, DCCV done.  S/p Ablation 3/19.  3. CAD: s/p CABG.  - LHC (1/16):  Patent LIMA-LAD and SVG-D. 80% mLCx and 99% PLOM,  total occlusion of SVG-PLOM.  Total occlusion of RCA with occlusion of SVG-RCA.  Patient had DES to mLCx-PLOM.  - LHC (1/22): Occluded SVG-OM and SVG-RCA with patent LIMA-LAD and SVG-D, occluded proximal RCA, occluded mid LCX.  The LCx occlusion was new. No interventional target. 4. HTN 5. Type II diabetes  6. Hyperlipidemia 7. Chronic systolic CHF: Ischemic cardiomyopathy.   - Echo (2/19): EF 15%, mildly dilated LV, mild LVH, inferolateral akinesis, milr MR, mildly dilated RV with severely reduced systolic function.  - CPX (2/19): Peak VO2 12.2, VE/VCO2 slope 53, RER 1.07 => severe limitation from heart failure.  - Medtronic ICD  - RHC (4/19): mean RA 5, PA 54/18 mean 31, mean PCWP 12, CI 2.5, PVR 3.6 WU - Echo (1/20): EF 20% - Barostimulator device placed in 1/20.  - RHC (6/20): mean RA 6, PA 42/17 mean 27, mean PCWP 12, CI 2.29, PVR 3.1  - Echo (3/21): EF 20-25%, mild RV dilation with moderately decreased systolic function. - RHC (1/22): RA mean 1, RV 48/1, PA 46/12 (26), PCWP mean 7, CO 4.46, CI 2.34, PVR 4.2 WU. 8. Interstitial lung disease: PFTs (2/19) were restrictive, raising concern for ILD.  - High resolution CT chest in 2/19: interstitial lung disease concerning for usual interstitial pneumonitis. - Bronchoscopy was done, possible amiodarone lung toxicity causing ILD, now on prednisone.  - High resolution chest CT (6/20): ILD in UIP pattern.   - Ofev 9. CKD stage 3 10. OSA: Home sleep study in 2020 with severe OSA.  11. ABIs (2/19): not significantly abnormal - ABIs (8/21): Normal 12. Carotid dopplers (2/19): Mild disease only.  - Carotid dopplers (1/20): Minimal stenosis.  13. Pulmonary hypertension: ?Group 3 due to ILD.  14. Nephrolithiasis 15. Gout  Current Outpatient Medications  Medication Sig Dispense Refill   ACCU-CHEK FASTCLIX LANCETS MISC Use as directed once a day.  Dx code: E11.9 100 each 1   albuterol (ACCUNEB) 0.63 MG/3ML nebulizer solution Take 3 mLs (0.63 mg  total) by nebulization every 6 (six) hours as needed for wheezing. 75 mL 12   albuterol (VENTOLIN HFA) 108 (90 Base) MCG/ACT inhaler Inhale 2 puffs into the lungs every 6 (six) hours as needed for wheezing or shortness of breath.      allopurinol (ZYLOPRIM) 300 MG tablet Take 1 tablet (300 mg total) by mouth daily. 30 tablet 6   apixaban (ELIQUIS) 5 MG TABS tablet TAKE 1 TABLET BY MOUTH 2 TIMES DAILY. 180 tablet 3   atorvastatin (LIPITOR) 40 MG tablet TAKE 1 TABLET BY MOUTH AT BEDTIME. 30 tablet 3   blood glucose meter kit and supplies Dispense based on patient and insurance preference. Use as directed once a day. Dx Code E11.9. 1 each 0   Blood Glucose Monitoring Suppl (ACCU-CHEK GUIDE) w/Device KIT 1 each by Does not apply route daily. DX Code: E11.9 1 kit 0  carvedilol (COREG) 3.125 MG tablet Take 1 tablet (3.125 mg total) by mouth 2 (two) times daily. 180 tablet 3   Continuous Blood Gluc Sensor (FREESTYLE LIBRE 2 SENSOR) MISC 1 Device by Does not apply route as directed. 2 each 11   digoxin (LANOXIN) 0.125 MG tablet TAKE 1/2 TABLET BY MOUTH DAILY. 45 tablet 3   fluticasone-salmeterol (ADVAIR HFA) 230-21 MCG/ACT inhaler Inhale 2 puffs into the lungs 2 (two) times daily. 1 each 12   glucose blood (ACCU-CHEK GUIDE) test strip Use as directed once a day.  Dx code: E11.9 100 each 1   guaiFENesin (MUCINEX) 600 MG 12 hr tablet Take 1 tablet (600 mg total) by mouth 2 (two) times daily as needed for to loosen phlegm.     icosapent Ethyl (VASCEPA) 1 g capsule Take 2 capsules (2 g total) by mouth 2 (two) times daily. 120 capsule 6   metFORMIN (GLUCOPHAGE) 500 MG tablet Take 1 tablet (500 mg total) by mouth 2 (two) times daily with a meal. 180 tablet 1   Nintedanib (OFEV) 150 MG CAPS Take 1 capsule (150 mg total) by mouth in the morning and at bedtime. 180 capsule 1   nitroGLYCERIN (NITROSTAT) 0.4 MG SL tablet Place 1 tablet (0.4 mg total) under the tongue every 5 (five) minutes as needed for chest pain. 25  tablet 3   OXYGEN Inhale 3 L into the lungs daily as needed (low oxygen).     predniSONE (DELTASONE) 10 MG tablet TAKE 1 TABLET (10 MG TOTAL) BY MOUTH DAILY WITH BREAKFAST. 30 tablet 3   sacubitril-valsartan (ENTRESTO) 24-26 MG TAKE 1 TABLET BY MOUTH 2 (TWO) TIMES DAILY. 60 tablet 11   furosemide (LASIX) 20 MG tablet Take 1 tablet (20 mg total) by mouth daily. 90 tablet 3   spironolactone (ALDACTONE) 25 MG tablet Take 0.5 tablets (12.5 mg total) by mouth daily. 45 tablet 3   No current facility-administered medications for this encounter.    Allergies:   Patient has no known allergies.   Social History:  The patient  reports that he has never smoked. His smokeless tobacco use includes chew. He reports current alcohol use of about 3.0 standard drinks of alcohol per week. He reports that he does not use drugs.   Family History:  The patient's family history includes Diabetes in his father and mother; Hyperlipidemia in his father; Hypertension in his father and mother.   ROS:  Please see the history of present illness.   All other systems are personally reviewed and negative.   Exam:    BP (!) 89/56   Pulse 99   Wt 71.1 kg (156 lb 12.8 oz)   SpO2 98% Comment: 3L N/C  BMI 23.16 kg/m  General: NAD Neck: No JVD, no thyromegaly or thyroid nodule.  Lungs: Crackles at bases.  CV: Nondisplaced PMI.  Heart regular S1/S2, no S3/S4, no murmur.  No peripheral edema.  No carotid bruit.  Normal pedal pulses.  Abdomen: Soft, nontender, no hepatosplenomegaly, no distention.  Skin: Intact without lesions or rashes.  Neurologic: Alert and oriented x 3.  Psych: Normal affect. Extremities: No clubbing or cyanosis.  HEENT: Normal.   Recent Labs: 07/21/2020: Platelets 183 07/29/2020: Hemoglobin 11.6; Hemoglobin 11.2 11/26/2020: ALT 10 12/14/2020: B Natriuretic Peptide 768.9; BUN 47; Creatinine, Ser 1.51; Potassium 5.4; Sodium 134  Personally reviewed   Wt Readings from Last 3 Encounters:  12/14/20  71.1 kg (156 lb 12.8 oz)  11/26/20 71.3 kg (157 lb 3.2 oz)  08/20/20  74.8 kg (165 lb)    ASSESSMENT AND PLAN:  1. Chronic systolic CHF: Ischemic cardiomyopathy. Medtronic ICD single chamber. 2/19 admission for decompensated HF requiring milrinone. Echo 2/7/019 EF 15% Mildly dilated RV. CPX in 2/19 was suggestive of severe limitation due to HF.   RHC in 4/19 showed normal filling pressures and preserved cardiac output.  Echo in 1/20 with EF 20%.  He had a Barostimulator device placed in 1/20.  RHC in 6/20 showed normal filling pressures, CI 2.29.  Echo 3/21 with EF 20-25%.  RHC (1/22) with RA mean 1, RV 48/1, PA 46/12 (26), PCWP mean 7, CO 4.46, CI 2.34, PVR 4.2 WU. He is not volume overloaded by exam or Optivol today, but has NYHA class IIIb symptoms.  I suspect that his symptoms are more due to pulmonary fibrosis.  He has not been wearing oxygen with exertion.  He has been having orthostatic symptoms.  - I think that he would have better exercise tolerance if he would use oxygen with exertion.  He will talk with the company that provides his oxygen to see if he can get a better portable unit.  - Decrease Lasix to 20 mg daily with optimized volume and orthostatic symptoms. I will also have him decrease spironolactone to 12.5 qhs.  Check BMET, BNP today.   - Continue carvedilol to 3.125 mg bid. - Continue digoxin 0.0625 mg daily, check level.    - Continue Entresto 24/26 bid.   - Has IVCD, not LBBB-like.  Saw Dr. Caryl Comes, will not upgrade ICD to CRT.  - With significant ILD, I do not think that he would be an LVAD candidate.   2. Afib/ Atrial flutter: Paroxysmal.  Admitted in 2/19 with severe HF decompensation in setting of atrial flutter with RVR.  He was cardioverted.  He then had atrial flutter ablation in 3/19. He is off amiodarone due to question of amiodarone lung toxicity. No palpitations.  No recent AF on device interrogation.  - He is now off amiodarone (question of lung toxicity, would not  re-challenge in future).   - Continue Eliquis 5 mg bid. Denies bleeding.  3. CAD: S/p CABG.  Last intervention was PCI to LCx in 2016. Repeat cath (1/22) with occluded SVG-OM and SVG-RCA with patent LIMA-LAD and SVG-D, occluded proximal RCA, and newly occluded mid LCX. No targets for revascularization. - Continue atorvastatin, good lipids in 12/21.   - Triglycerides improved on Vascepa 2 g bid.  - No ASA with Eliquis use.  4. Pulmonary fibrosis: PFTs in 2/19 were restrictive, and high resolution CT showed interstitial lung disease. Amiodarone was stopped due to concern for possible toxicity.  He has seen [name] => bronchoscopy suggestive of ILD related to amiodarone.  He has been on chronic prednisone.  High resolution CT chest repeated in 6/20, again showing ILD, described at UIP pattern. He has started Ofev. I think that a significant amount of his symptomatology is due to pulmonary fibrosis.  - He needs to make sure to wear oxygen with exertion.  - Continue Ofev.    - Continue prednisone 10 mg daily.  - Continue pulmonary followup.  5. OSA: Continue CPAP.    6. CKD: Stage 3.  - Followup with nephrology.  - Check BMET today. 7. Pulmonary hypertension: Suspect group 3 PH due to lung disease (ILD). RHC (1/22) with moderate pulmonary arterial hypertension, PVR 4.2 WU. - He did not tolerate Tyvaso for PH-ILD. 8. DM2: He did not tolerate [place].   Followup  in 6 wks with APP.    Loralie Champagne 12/14/20

## 2020-12-17 ENCOUNTER — Ambulatory Visit: Payer: Medicare HMO | Admitting: Podiatry

## 2020-12-17 NOTE — Progress Notes (Signed)
Pt returned call he is aware and agreeable with plan.

## 2020-12-22 ENCOUNTER — Encounter (HOSPITAL_COMMUNITY): Payer: Self-pay | Admitting: *Deleted

## 2020-12-23 ENCOUNTER — Ambulatory Visit (INDEPENDENT_AMBULATORY_CARE_PROVIDER_SITE_OTHER): Payer: Medicare HMO | Admitting: Family Medicine

## 2020-12-23 ENCOUNTER — Other Ambulatory Visit: Payer: Self-pay

## 2020-12-23 ENCOUNTER — Encounter: Payer: Self-pay | Admitting: Family Medicine

## 2020-12-23 VITALS — BP 120/80 | HR 119 | Temp 97.5°F | Resp 18 | Wt 154.8 lb

## 2020-12-23 DIAGNOSIS — L03116 Cellulitis of left lower limb: Secondary | ICD-10-CM | POA: Diagnosis not present

## 2020-12-23 DIAGNOSIS — I5022 Chronic systolic (congestive) heart failure: Secondary | ICD-10-CM | POA: Diagnosis not present

## 2020-12-23 DIAGNOSIS — M2550 Pain in unspecified joint: Secondary | ICD-10-CM | POA: Diagnosis not present

## 2020-12-23 DIAGNOSIS — E1122 Type 2 diabetes mellitus with diabetic chronic kidney disease: Secondary | ICD-10-CM

## 2020-12-23 DIAGNOSIS — N1832 Chronic kidney disease, stage 3b: Secondary | ICD-10-CM

## 2020-12-23 DIAGNOSIS — M542 Cervicalgia: Secondary | ICD-10-CM | POA: Diagnosis not present

## 2020-12-23 DIAGNOSIS — R6881 Early satiety: Secondary | ICD-10-CM | POA: Diagnosis not present

## 2020-12-23 DIAGNOSIS — J849 Interstitial pulmonary disease, unspecified: Secondary | ICD-10-CM

## 2020-12-23 DIAGNOSIS — I483 Typical atrial flutter: Secondary | ICD-10-CM | POA: Diagnosis not present

## 2020-12-23 DIAGNOSIS — M1A0721 Idiopathic chronic gout, left ankle and foot, with tophus (tophi): Secondary | ICD-10-CM

## 2020-12-23 DIAGNOSIS — I10 Essential (primary) hypertension: Secondary | ICD-10-CM | POA: Diagnosis not present

## 2020-12-23 DIAGNOSIS — N182 Chronic kidney disease, stage 2 (mild): Secondary | ICD-10-CM | POA: Diagnosis not present

## 2020-12-23 DIAGNOSIS — R5383 Other fatigue: Secondary | ICD-10-CM

## 2020-12-23 DIAGNOSIS — I214 Non-ST elevation (NSTEMI) myocardial infarction: Secondary | ICD-10-CM

## 2020-12-23 DIAGNOSIS — E1151 Type 2 diabetes mellitus with diabetic peripheral angiopathy without gangrene: Secondary | ICD-10-CM | POA: Insufficient documentation

## 2020-12-23 MED ORDER — DOXYCYCLINE HYCLATE 100 MG PO TABS: 100.0000 mg | ORAL_TABLET | Freq: Two times a day (BID) | ORAL | 0 refills | Status: DC

## 2020-12-23 NOTE — Assessment & Plan Note (Signed)
Per cardiology 

## 2020-12-23 NOTE — Assessment & Plan Note (Signed)
Per pulmonary

## 2020-12-23 NOTE — Assessment & Plan Note (Signed)
Per nephrology 

## 2020-12-23 NOTE — Assessment & Plan Note (Addendum)
Lab Results  Component Value Date   HGBA1C 6.8 (H) 06/14/2020   Per endo

## 2020-12-23 NOTE — Patient Instructions (Signed)
Gout  Gout is a condition that causes painful swelling of the joints. Gout is a type of inflammation of the joints (arthritis). This condition is caused by having too much uric acid in the body. Uric acid is a chemical that forms when the body breaks down substances called purines.Purines are important for building body proteins. When the body has too much uric acid, sharp crystals can form and build up inside the joints. This causes pain and swelling. Gout attacks can happen quickly and may be very painful (acute gout). Over time, the attacks can affect more joints and become more frequent (chronic gout). Gout can also cause uric acid to build up under the skin and inside thekidneys. What are the causes? This condition is caused by too much uric acid in your blood. This can happen because: Your kidneys do not remove enough uric acid from your blood. This is the most common cause. Your body makes too much uric acid. This can happen with some cancers and cancer treatments. It can also occur if your body is breaking down too many red blood cells (hemolytic anemia). You eat too many foods that are high in purines. These foods include organ meats and some seafood. Alcohol, especially beer, is also high in purines. A gout attack may be triggered by trauma or stress. What increases the risk? You are more likely to develop this condition if you: Have a family history of gout. Are male and middle-aged. Are male and have gone through menopause. Are obese. Frequently drink alcohol, especially beer. Are dehydrated. Lose weight too quickly. Have an organ transplant. Have lead poisoning. Take certain medicines, including aspirin, cyclosporine, diuretics, levodopa, and niacin. Have kidney disease. Have a skin condition called psoriasis. What are the signs or symptoms? An attack of acute gout happens quickly. It usually occurs in just one joint. The most common place is the big toe. Attacks often start  at night. Other joints that may be affected include joints of the feet, ankle, knee, fingers, wrist, or elbow. Symptoms of this condition may include: Severe pain. Warmth. Swelling. Stiffness. Tenderness. The affected joint may be very painful to touch. Shiny, red, or purple skin. Chills and fever. Chronic gout may cause symptoms more frequently. More joints may be involved. You may also have white or yellow lumps (tophi) on your hands or feet or in other areas near your joints. How is this diagnosed? This condition is diagnosed based on your symptoms, medical history, and physical exam. You may have tests, such as: Blood tests to measure uric acid levels. Removal of joint fluid with a thin needle (aspiration) to look for uric acid crystals. X-rays to look for joint damage. How is this treated? Treatment for this condition has two phases: treating an acute attack and preventing future attacks. Acute gout treatment may include medicines to reduce pain and swelling, including: NSAIDs. Steroids. These are strong anti-inflammatory medicines that can be taken by mouth (orally) or injected into a joint. Colchicine. This medicine relieves pain and swelling when it is taken soon after an attack. It can be given by mouth or through an IV. Preventive treatment may include: Daily use of smaller doses of NSAIDs or colchicine. Use of a medicine that reduces uric acid levels in your blood. Changes to your diet. You may need to see a dietitian about what to eat and drink to prevent gout. Follow these instructions at home: During a gout attack  If directed, put ice on the affected area: Put  ice in a plastic bag. Place a towel between your skin and the bag. Leave the ice on for 20 minutes, 2-3 times a day. Raise (elevate) the affected joint above the level of your heart as often as possible. Rest the joint as much as possible. If the affected joint is in your leg, you may be given crutches to  use. Follow instructions from your health care provider about eating or drinking restrictions.  Avoiding future gout attacks Follow a low-purine diet as told by your dietitian or health care provider. Avoid foods and drinks that are high in purines, including liver, kidney, anchovies, asparagus, herring, mushrooms, mussels, and beer. Maintain a healthy weight or lose weight if you are overweight. If you want to lose weight, talk with your health care provider. It is important that you do not lose weight too quickly. Start or maintain an exercise program as told by your health care provider. Eating and drinking Drink enough fluids to keep your urine pale yellow. If you drink alcohol: Limit how much you use to: 0-1 drink a day for women. 0-2 drinks a day for men. Be aware of how much alcohol is in your drink. In the [place] one drink equals one 12 oz bottle of beer (355 mL) one 5 oz glass of wine (148 mL), or one 1 oz glass of hard liquor (44 mL). General instructions Take over-the-counter and prescription medicines only as told by your health care provider. Do not drive or use heavy machinery while taking prescription pain medicine. Return to your normal activities as told by your health care provider. Ask your health care provider what activities are safe for you. Keep all follow-up visits as told by your health care provider. This is important. Contact a health care provider if you have: Another gout attack. Continuing symptoms of a gout attack after 10 days of treatment. Side effects from your medicines. Chills or a fever. Burning pain when you urinate. Pain in your lower back or belly. Get help right away if you: Have severe or uncontrolled pain. Cannot urinate. Summary Gout is painful swelling of the joints caused by inflammation. The most common site of pain is the big toe, but it can affect other joints in the body. Medicines and dietary changes can help to prevent and treat gout  attacks. This information is not intended to replace advice given to you by your health care provider. Make sure you discuss any questions you have with your healthcare provider. Document Revised: 01/09/2018 Document Reviewed: 01/09/2018 Elsevier Patient Education  [employer].

## 2020-12-23 NOTE — Assessment & Plan Note (Signed)
Well controlled, no changes to meds. Encouraged heart healthy diet such as the DASH diet and exercise as tolerated.  °

## 2020-12-23 NOTE — Progress Notes (Signed)
Subjective:   By signing my name below, I, Shehryar Baig, attest that this documentation has been prepared under the direction and in the presence of Dr. Roma Schanz, DO. 12/23/2020     Patient ID: Vincent Chandler, male    DOB: 09/07/1960, 60 y.o.   MRN: [id number]  Chief Complaint  Patient presents with   Gout    Left foot. "Bubble" on the top of the foot that might need to be drained. Taking Allopurinol 300 mg. Concerns/ questions: Gout L foot, on O2 full time now from cardiology, states no strength in legs    HPI Patient is in today for a office visit. He complains of a fluid filled pocket on the top of his left foot for the past couple of months and has worsened in the past month. He was diagnosed with gout on his left foot a couple of months ago. He also reports that his right foot has recently started swelling. During his last screening he had elevated levels of uric acid.  He also complains that his legs have progressively weakened. He struggles walking long distances without getting winded. He continues using his oxygen all day but he has found no relief in his symptoms.  He also complains that his entire back has aching pain. His pain worsens and he feels dizzy when he bends down to pick anything up while standing. He has no prior history of back pain. His back pain makes him uncomfortable while sitting. He requested a wheel chair to transport him for blood work after this visit. His appetite has progressively decreased. He has the hunger to eat but he finds that finishing his food is difficult. He has lost weight since he first started experiencing his loss of appetites. He does not feel nauseas while eating. Wt Readings from Last 3 Encounters:  12/23/20 154 lb 12.8 oz (70.2 kg)  12/14/20 156 lb 12.8 oz (71.1 kg)  11/26/20 157 lb 3.2 oz (71.3 kg)   He is coughing sporadically during this visit.   Past Medical History:  Diagnosis Date   AICD (automatic  cardioverter/defibrillator) present    Anginal pain (HCC)    Anxiety    Asthma    Atrial fibrillation-postoperative 11/28/2012   Cardiomyopathy    alcohol use related   CHF (congestive heart failure) (HCC)    Chronic back pain    Coronary artery disease    drug eluting stent RCA 2005-EF 30%- s/p CABG x 4; 2/4 patent grafts with SVG-PLOM and SVG-RCA system totally occluded.  There are collaterals from the LAD system to the RCA and the RCA is totally occluded proximally.  There are no collaterals to the LCx territory.  He then underwent successful PCI of the mid left circumflex artery with overlapping Synergy drug-eluting stents   Depression    pt denies   Diabetes mellitus type II dx'd in the 1990's   GERD (gastroesophageal reflux disease)    yrs ago   H/O hiatal hernia    Hemorrhoids    History of hypogonadism    History of kidney stones    Hyperlipidemia    Hypertension    OSA on CPAP    "mask is broken; working on getting a new one" (01/15/2015; 10/03/2017)   Pneumonia    "3-4 times" (10/03/2017)   Urinary incontinence     Past Surgical History:  Procedure Laterality Date   A-FLUTTER ABLATION N/A 09/12/2017   Procedure: A-FLUTTER ABLATION;  Surgeon: Deboraha Sprang, MD;  Location:  [facility];  Service: Cardiovascular;  Laterality: N/A;   CARDIAC DEFIBRILLATOR PLACEMENT  01/15/2015   CARDIOVERSION N/A 08/10/2017   Procedure: CARDIOVERSION;  Surgeon: Larey Dresser, MD;  Location: [facility] ENDOSCOPY;  Service: Cardiovascular;  Laterality: N/A;   CORONARY ANGIOPLASTY WITH STENT PLACEMENT  ~ 2003   CORONARY ARTERY BYPASS GRAFT N/A 08/15/2012   Procedure: CORONARY ARTERY BYPASS GRAFTING (CABG);  Surgeon: Ivin Poot, MD;  Location: [facility];  Service: Open Heart Surgery;  Laterality: N/A;  Coronary Artery Bypass Grafting Times Four Using Left Internal Mammary Artery and Right Saphenous Leg Vein Harvested Endoscopically   EP IMPLANTABLE DEVICE N/A 01/15/2015   Procedure: ICD Implant;   Surgeon: Deboraha Sprang, MD;  Location: [place] [facility];  Service: Cardiovascular;  Laterality: N/A;   LEFT HEART CATHETERIZATION WITH CORONARY ANGIOGRAM N/A 08/08/2012   Procedure: LEFT HEART CATHETERIZATION WITH CORONARY ANGIOGRAM;  Surgeon: Peter M Martinique, MD;  Location[facility] CATH LAB;  Service: Cardiovascular;  Laterality: N/A;   LEFT HEART CATHETERIZATION WITH CORONARY/GRAFT ANGIOGRAM N/A 07/21/2014   Procedure: LEFT HEART CATHETERIZATION WITH Beatrix Fetters;  Surgeon: Larey Dresser, MD;  Location: [facility] C[facility] LAB;  Service: Cardiovascular;  Laterality: N/A;   MULTIPLE EXTRACTIONS WITH ALVEOLOPLASTY N/A 10/04/2017   Procedure: Extraction of tooth #'s 5 and 14 with alveoloplasty and gross debridement of remaining teeth;  Surgeon: [name], DDS;  Location: [place];  Service: Oral Surgery;  Laterality: N/A;   PERCUTANEOUS CORONARY STENT INTERVENTION (PCI-S)  07/21/2014   Procedure: PERCUTANEOUS CORONARY STENT INTERVENTION (PCI-S);  Surgeon: Larey Dresser, MD;  Location: [facility] CATH LAB;  Service: Cardiovascular;;   REFRACTIVE SURGERY Bilateral 1990's   RIGHT HEART CATH N/A 10/03/2017   Procedure: RIGHT HEART CATH;  Surgeon: Larey Dresser, MD;  Location: [place] CV LAB;  Service: Cardiovascular;  Laterality: N/A;   RIGHT HEART CATH N/A 12/11/2018   Procedure: RIGHT HEART CATH;  Surgeon: Larey Dresser, MD;  Location: [place] [facility];  Service: Cardiovascular;  Laterality: N/A;   RIGHT/LEFT HEART CATH AND CORONARY/GRAFT ANGIOGRAPHY N/A 07/29/2020   Procedure: RIGHT/LEFT HEART CATH AND CORONARY/GRAFT ANGIOGRAPHY;  Surgeon: Larey Dresser, MD;  Location: [place] CV LAB;  Service: Cardiovascular;  Laterality: N/A;   TEE WITHOUT CARDIOVERSION N/A 08/10/2017   Procedure: TRANSESOPHAGEAL ECHOCARDIOGRAM (TEE);  Surgeon: Larey Dresser, MD;  Location: [facility] ENDOSCOPY;  Service: Cardiovascular;  Laterality: N/A;   TONSILLECTOMY  ~ [place] Bilateral 11/28/2017    Procedure: VIDEO BRONCHOSCOPY WITHOUT FLUORO;  Surgeon: Brand Males, MD;  Location: [facility] ENDOSCOPY;  Service: Endoscopy;  Laterality: Bilateral;    Family History  Problem Relation Age of Onset   Hypertension Mother    Diabetes Mother    Hypertension Father    Diabetes Father    Hyperlipidemia Father     Social History   Socioeconomic History   Marital status: Divorced    Spouse name: n/a   Number of children: 0   Years of education: 12th grade   Highest education level: Not on file  Occupational History   Occupation: Data processing manager ---self employed    Employer: [employer]  Tobacco Use   Smoking status: Never   Smokeless tobacco: Current    Types: Chew  Vaping Use   Vaping Use: Never used  Substance and Sexual Activity   Alcohol use: Yes    Alcohol/week: 3.0 standard drinks    Types: 3 Glasses of wine per week  Drug use: No   Sexual activity: Not Currently    Partners: Female  Other Topics Concern   Not on file  Social History Narrative   Exercise-- walking    Lives alone.   Brother lives in [place], [place]   Social Determinants of Health   Financial Resource Strain: Not on file  Food Insecurity: Not on file  Transportation Needs: Not on file  Physical Activity: Not on file  Stress: Not on file  Social Connections: Not on file  Intimate Partner Violence: Not on file    Outpatient Medications Prior to Visit  Medication Sig Dispense Refill   ACCU-CHEK FASTCLIX LANCETS MISC Use as directed once a day.  Dx code: E11.9 100 each 1   albuterol (ACCUNEB) 0.63 MG/3ML nebulizer solution Take 3 mLs (0.63 mg total) by nebulization every 6 (six) hours as needed for wheezing. 75 mL 12   albuterol (VENTOLIN HFA) 108 (90 Base) MCG/ACT inhaler Inhale 2 puffs into the lungs every 6 (six) hours as needed for wheezing or shortness of breath.      allopurinol (ZYLOPRIM) 300 MG tablet Take 1 tablet (300 mg total) by mouth daily. 30 tablet 6    apixaban (ELIQUIS) 5 MG TABS tablet TAKE 1 TABLET BY MOUTH 2 TIMES DAILY. 180 tablet 3   atorvastatin (LIPITOR) 40 MG tablet TAKE 1 TABLET BY MOUTH AT BEDTIME. 30 tablet 3   blood glucose meter kit and supplies Dispense based on patient and insurance preference. Use as directed once a day. Dx Code E11.9. 1 each 0   Blood Glucose Monitoring Suppl (ACCU-CHEK GUIDE) w/Device KIT 1 each by Does not apply route daily. DX Code: E11.9 1 kit 0   carvedilol (COREG) 3.125 MG tablet Take 1 tablet (3.125 mg total) by mouth 2 (two) times daily. 180 tablet 3   Continuous Blood Gluc Sensor (FREESTYLE LIBRE 2 SENSOR) MISC 1 Device by Does not apply route as directed. 2 each 11   digoxin (LANOXIN) 0.125 MG tablet TAKE 1/2 TABLET BY MOUTH DAILY. 45 tablet 3   fluticasone-salmeterol (ADVAIR HFA) 230-21 MCG/ACT inhaler Inhale 2 puffs into the lungs 2 (two) times daily. 1 each 12   furosemide (LASIX) 20 MG tablet Take 1 tablet (20 mg total) by mouth daily. 90 tablet 3   glucose blood (ACCU-CHEK GUIDE) test strip Use as directed once a day.  Dx code: E11.9 100 each 1   guaiFENesin (MUCINEX) 600 MG 12 hr tablet Take 1 tablet (600 mg total) by mouth 2 (two) times daily as needed for to loosen phlegm.     icosapent Ethyl (VASCEPA) 1 g capsule Take 2 capsules (2 g total) by mouth 2 (two) times daily. 120 capsule 6   metFORMIN (GLUCOPHAGE) 500 MG tablet Take 1 tablet (500 mg total) by mouth 2 (two) times daily with a meal. 180 tablet 1   Nintedanib (OFEV) 150 MG CAPS Take 1 capsule (150 mg total) by mouth in the morning and at bedtime. 180 capsule 1   nitroGLYCERIN (NITROSTAT) 0.4 MG SL tablet Place 1 tablet (0.4 mg total) under the tongue every 5 (five) minutes as needed for chest pain. 25 tablet 3   OXYGEN Inhale 3 L into the lungs daily as needed (low oxygen).     predniSONE (DELTASONE) 10 MG tablet TAKE 1 TABLET (10 MG TOTAL) BY MOUTH DAILY WITH BREAKFAST. 30 tablet 3   sacubitril-valsartan (ENTRESTO) 24-26 MG TAKE 1  TABLET BY MOUTH 2 (TWO) TIMES DAILY. 60 tablet 11  spironolactone (ALDACTONE) 25 MG tablet Take 0.5 tablets (12.5 mg total) by mouth daily. 45 tablet 3   No facility-administered medications prior to visit.    No Known Allergies  Review of Systems  Constitutional:  Positive for malaise/fatigue and weight loss. Negative for fever.       (+)Feeling cold easily  HENT:  Negative for congestion.   Eyes:  Negative for blurred vision.  Respiratory:  Positive for cough (Sporadically). Negative for shortness of breath.   Cardiovascular:  Positive for leg swelling (Bilateral feet). Negative for chest pain and palpitations.  Gastrointestinal:  Negative for vomiting.  Musculoskeletal:  Positive for back pain (aching pain entire back) and myalgias (Left foot).       (-)Legs weakness  Skin:  Negative for rash.       (+)Fluid filed abscess on left foot  Neurological:  Positive for weakness. Negative for loss of consciousness and headaches.      Objective:    Physical Exam Vitals and nursing note reviewed.  Constitutional:      General: He is not in acute distress.    Appearance: Normal appearance. He is not ill-appearing.  HENT:     Head: Normocephalic and atraumatic.     Right Ear: External ear normal.     Left Ear: External ear normal.  Eyes:     Extraocular Movements: Extraocular movements intact.     Pupils: Pupils are equal, round, and reactive to light.  Cardiovascular:     Rate and Rhythm: Normal rate and regular rhythm.     Pulses: Normal pulses.     Heart sounds: Normal heart sounds. No murmur heard.   No gallop.  Pulmonary:     Effort: No respiratory distress.     Breath sounds: Normal breath sounds. Decreased air movement present. No wheezing, rhonchi or rales.  Chest:     Chest wall: No tenderness.  Musculoskeletal:        General: Tenderness present.     Right lower leg: No edema.     Left lower leg: No edema.     Comments: Pain in spine from neck to low back Pt c/o  pain with movement + weakness in both legs --- hip flexion and flex / ext low legs   Feet:     Comments: Left foot hot, red, and tender to touch.  Swelling on top of foot that his sports medicine physician referred to as his gout symptoms.  Skin:    General: Skin is warm and dry.     Findings: Erythema present.     Comments: Top L foot -- hot , red and tender to touch + swelling   Neurological:     Mental Status: He is alert and oriented to person, place, and time.     Motor: Weakness present.  Psychiatric:        Behavior: Behavior normal.    BP 120/80 (BP Location: Right Arm, Patient Position: Sitting, Cuff Size: Normal)   Pulse (!) 119   Temp (!) 97.5 F (36.4 C) (Oral)   Resp 18   Wt 154 lb 12.8 oz (70.2 kg)   SpO2 96%   BMI 22.86 kg/m  Wt Readings from Last 3 Encounters:  12/23/20 154 lb 12.8 oz (70.2 kg)  12/14/20 156 lb 12.8 oz (71.1 kg)  11/26/20 157 lb 3.2 oz (71.3 kg)    Diabetic Foot Exam - Simple   No data filed    Lab Results  Component Value Date  WBC 6.9 07/21/2020   HGB 11.6 (L) 07/29/2020   HGB 11.2 (L) 07/29/2020   HCT 34.0 (L) 07/29/2020   HCT 33.0 (L) 07/29/2020   PLT 183 07/21/2020   GLUCOSE 252 (H) 12/14/2020   CHOL 145 06/14/2020   TRIG 178.0 (H) 06/14/2020   HDL 55.50 06/14/2020   LDLDIRECT 69.0 11/20/2017   LDLCALC 54 06/14/2020   ALT 10 11/26/2020   AST 14 11/26/2020   NA 134 (L) 12/14/2020   K 5.4 (H) 12/14/2020   CL 98 12/14/2020   CREATININE 1.51 (H) 12/14/2020   BUN 47 (H) 12/14/2020   CO2 24 12/14/2020   TSH 4.560 (H) 09/10/2017   PSA 0.26 09/21/2015   INR 1.4 (H) 12/03/2018   HGBA1C 6.8 (H) 06/14/2020   MICROALBUR 1.2 06/14/2020    Lab Results  Component Value Date   TSH 4.560 (H) 09/10/2017   Lab Results  Component Value Date   WBC 6.9 07/21/2020   HGB 11.6 (L) 07/29/2020   HGB 11.2 (L) 07/29/2020   HCT 34.0 (L) 07/29/2020   HCT 33.0 (L) 07/29/2020   MCV 111.9 (H) 07/21/2020   PLT 183 07/21/2020   Lab  Results  Component Value Date   NA 134 (L) 12/14/2020   K 5.4 (H) 12/14/2020   CO2 24 12/14/2020   GLUCOSE 252 (H) 12/14/2020   BUN 47 (H) 12/14/2020   CREATININE 1.51 (H) 12/14/2020   BILITOT 0.6 11/26/2020   ALKPHOS 43 06/14/2020   AST 14 11/26/2020   ALT 10 11/26/2020   PROT 6.6 11/26/2020   ALBUMIN 3.8 06/14/2020   CALCIUM 9.6 12/14/2020   ANIONGAP 12 12/14/2020   GFR 48.91 (L) 06/14/2020   Lab Results  Component Value Date   CHOL 145 06/14/2020   Lab Results  Component Value Date   HDL 55.50 06/14/2020   Lab Results  Component Value Date   LDLCALC 54 06/14/2020   Lab Results  Component Value Date   TRIG 178.0 (H) 06/14/2020   Lab Results  Component Value Date   CHOLHDL 3 06/14/2020   Lab Results  Component Value Date   HGBA1C 6.8 (H) 06/14/2020       Assessment & Plan:   Problem List Items Addressed This Visit       Unprioritized   Gout - Primary   Relevant Orders   Comprehensive metabolic panel   Uric acid   DG Cervical Spine Complete   DG Thoracic Spine 2 View   DG Lumbar Spine Complete   Rheumatoid Factor   Other Visit Diagnoses     Other fatigue       Relevant Orders   TSH   Vitamin B12   CBC with Differential/Platelet   Comprehensive metabolic panel   DG Cervical Spine Complete   DG Thoracic Spine 2 View   DG Lumbar Spine Complete   Rheumatoid Factor   Early satiety       Relevant Orders   Ambulatory referral to Gastroenterology   Cellulitis of left lower extremity       Relevant Medications   doxycycline (VIBRA-TABS) 100 MG tablet   Neck pain       Relevant Orders   DG Cervical Spine Complete   DG Thoracic Spine 2 View   DG Lumbar Spine Complete   Arthralgia, unspecified joint       Relevant Orders   Antinuclear Antib (ANA)   Rheumatoid Factor        Meds ordered this encounter  Medications  doxycycline (VIBRA-TABS) 100 MG tablet    Sig: Take 1 tablet (100 mg total) by mouth 2 (two) times daily.    Dispense:   20 tablet    Refill:  0    I, Dr. Roma Schanz, DO, personally preformed the services described in this documentation.  All medical record entries made by the scribe were at my direction and in my presence.  I have reviewed the chart and discharge instructions (if applicable) and agree that the record reflects my personal performance and is accurate and complete. 12/23/2020   I,Shehryar Baig,acting as a scribe for [employer], DO.,have documented all relevant documentation on the behalf of Ann Held, DO,as directed by  Ann Held, DO while in the presence of Ann Held, DO.   Ann Held, DO

## 2020-12-24 ENCOUNTER — Telehealth (HOSPITAL_COMMUNITY): Payer: Self-pay | Admitting: *Deleted

## 2020-12-24 DIAGNOSIS — I5022 Chronic systolic (congestive) heart failure: Secondary | ICD-10-CM

## 2020-12-24 DIAGNOSIS — E875 Hyperkalemia: Secondary | ICD-10-CM

## 2020-12-24 LAB — COMPREHENSIVE METABOLIC PANEL
ALT: 37 U/L (ref 0–53)
AST: 46 U/L — ABNORMAL HIGH (ref 0–37)
Albumin: 3.7 g/dL (ref 3.5–5.2)
Alkaline Phosphatase: 98 U/L (ref 39–117)
BUN: 47 mg/dL — ABNORMAL HIGH (ref 6–23)
CO2: 22 mEq/L (ref 19–32)
Calcium: 9.6 mg/dL (ref 8.4–10.5)
Chloride: 98 mEq/L (ref 96–112)
Creatinine, Ser: 1.86 mg/dL — ABNORMAL HIGH (ref 0.40–1.50)
GFR: 39.15 mL/min — ABNORMAL LOW (ref >60.00)
Glucose, Bld: 89 mg/dL (ref 70–99)
Potassium: 5.9 mEq/L — ABNORMAL HIGH (ref 3.5–5.1)
Sodium: 135 mEq/L (ref 135–145)
Total Bilirubin: 0.7 mg/dL (ref 0.2–1.2)
Total Protein: 7.1 g/dL (ref 6.0–8.3)

## 2020-12-24 LAB — CBC WITH DIFFERENTIAL/PLATELET
Basophils Absolute: 0.2 10*3/uL — ABNORMAL HIGH (ref 0.0–0.1)
Basophils Relative: 1.5 % (ref 0.0–3.0)
Eosinophils Absolute: 0.4 10*3/uL (ref 0.0–0.7)
Eosinophils Relative: 3.2 % (ref 0.0–5.0)
HCT: 37.2 % — ABNORMAL LOW (ref 39.0–52.0)
Hemoglobin: 12 g/dL — ABNORMAL LOW (ref 13.0–17.0)
Lymphocytes Relative: 12.8 % (ref 12.0–46.0)
Lymphs Abs: 1.5 10*3/uL (ref 0.7–4.0)
MCHC: 32.3 g/dL (ref 30.0–36.0)
MCV: 118.2 fl — ABNORMAL HIGH (ref 78.0–100.0)
Monocytes Absolute: 0.8 10*3/uL (ref 0.1–1.0)
Monocytes Relative: 6.4 % (ref 3.0–12.0)
Neutro Abs: 9 10*3/uL — ABNORMAL HIGH (ref 1.4–7.7)
Neutrophils Relative %: 76.1 % (ref 43.0–77.0)
Platelets: 268 10*3/uL (ref 150.0–400.0)
RBC: 3.15 Mil/uL — ABNORMAL LOW (ref 4.22–5.81)
RDW: 15.9 % — ABNORMAL HIGH (ref 11.5–15.5)
WBC: 11.8 10*3/uL — ABNORMAL HIGH (ref 4.0–10.5)

## 2020-12-24 LAB — TSH: TSH: 6.28 u[IU]/mL — ABNORMAL HIGH (ref 0.35–4.50)

## 2020-12-24 LAB — VITAMIN B12: Vitamin B-12: 891 pg/mL (ref 211–911)

## 2020-12-24 LAB — URIC ACID: Uric Acid, Serum: 5.8 mg/dL (ref 4.0–7.8)

## 2020-12-24 MED ORDER — LOKELMA 10 G PO PACK: 10.0000 g | PACK | Freq: Once | ORAL | 0 refills | Status: AC

## 2020-12-24 NOTE — Telephone Encounter (Signed)
-----  Message from Larey Dresser, MD sent at 12/14/2020  2:24 PM EDT ----- Keep Lasix at 40 mg daily rather than decreasing to 20 mg daily.  Make sure he cut spironolactone to 12.5 mg qhs.  Low K diet.  BMET again in 10 days.

## 2020-12-24 NOTE — Telephone Encounter (Signed)
[place], RN  12/24/2020  5:10 PM EDT Back to Top     Finally reached pt by phone, he states he has been taking Spiro 12.5 mg and Lasix 20 mg Daily. Pt had repeat labs at PCP 6/23 which showed K 5.9, discussed with Allena Katz, NP she recommends pt stop Arlyce Harman and take 1 dose of Lokelma, and repeat labs Mon. If he can't get Lokelma should report to ER for repeat K level and treatment. Pt is agreeable with this plan and [facility], rx sent in, repeat labs sch for MOn     Scarlette [place], RN  12/22/2020  3:53 PM EDT      Unable to reach pt, have sent mychart message   Shonna Chock, [place]  12/22/2020  3:51 PM EDT      [place], RN  12/15/2020 10:33 AM EDT      I called patient and left message for a return call regarding labwork andinstructions from Dr. [name].   Larey Dresser, MD  12/14/2020  3:11 PM EDT      When he gets BMET again, repeat digoxin level as a trough.    Larey Dresser, MD  12/14/2020  2:24 PM EDT      Keep Lasix at 40 mg daily rather than decreasing to 20 mg daily.  Make sure hecut spironolactone to 12.5 mg qhs.  Low K diet.  BMET again in 10 days.

## 2020-12-25 LAB — RHEUMATOID FACTOR: Rheumatoid fact SerPl-aCnc: <14 IU/mL (ref <14)

## 2020-12-25 LAB — ANA: Anti Nuclear Antibody (ANA): NEGATIVE

## 2020-12-27 ENCOUNTER — Other Ambulatory Visit (HOSPITAL_COMMUNITY): Payer: Self-pay

## 2020-12-27 ENCOUNTER — Other Ambulatory Visit: Payer: Self-pay

## 2020-12-27 ENCOUNTER — Ambulatory Visit (HOSPITAL_COMMUNITY)
Admission: RE | Admit: 2020-12-27 | Discharge: 2020-12-27 | Disposition: A | Discharge status: Home / Self Care | Payer: Medicare HMO | Source: Ambulatory Visit | Attending: Internal Medicine | Admitting: Internal Medicine

## 2020-12-27 ENCOUNTER — Encounter (HOSPITAL_COMMUNITY): Payer: Medicare HMO

## 2020-12-27 DIAGNOSIS — I5022 Chronic systolic (congestive) heart failure: Secondary | ICD-10-CM | POA: Insufficient documentation

## 2020-12-27 DIAGNOSIS — E875 Hyperkalemia: Secondary | ICD-10-CM

## 2020-12-27 LAB — BASIC METABOLIC PANEL
Anion gap: 11 (ref 5–15)
BUN: 37 mg/dL — ABNORMAL HIGH (ref 6–20)
CO2: 24 mmol/L (ref 22–32)
Calcium: 9.1 mg/dL (ref 8.9–10.3)
Chloride: 104 mmol/L (ref 98–111)
Creatinine, Ser: 1.52 mg/dL — ABNORMAL HIGH (ref 0.61–1.24)
GFR, Estimated: 52 mL/min — ABNORMAL LOW (ref >60)
Glucose, Bld: 88 mg/dL (ref 70–99)
Potassium: 4.4 mmol/L (ref 3.5–5.1)
Sodium: 139 mmol/L (ref 135–145)

## 2020-12-27 MED FILL — Sacubitril-Valsartan Tab 24-26 MG: ORAL | 30 days supply | Qty: 60 | Fill #2 | Status: AC

## 2020-12-28 ENCOUNTER — Ambulatory Visit: Payer: Medicare HMO | Admitting: Family Medicine

## 2020-12-28 NOTE — Progress Notes (Incomplete)
Subjective:   By signing my name below, I, Shehryar Baig, attest that this documentation has been prepared under the direction and in the presence of Dr. Roma Schanz, DO. 12/28/2020    Patient ID: Vincent Chandler, male    DOB: 1960-10-20, 60 y.o.   MRN: [id number]  No chief complaint on file.   HPI Patient is in today for a office visit.   Past Medical History:  Diagnosis Date   AICD (automatic cardioverter/defibrillator) present    Anginal pain (HCC)    Anxiety    Asthma    Atrial fibrillation-postoperative 11/28/2012   Cardiomyopathy    alcohol use related   CHF (congestive heart failure) (HCC)    Chronic back pain    Coronary artery disease    drug eluting stent RCA 2005-EF 30%- s/p CABG x 4; 2/4 patent grafts with SVG-PLOM and SVG-RCA system totally occluded.  There are collaterals from the LAD system to the RCA and the RCA is totally occluded proximally.  There are no collaterals to the LCx territory.  He then underwent successful PCI of the mid left circumflex artery with overlapping Synergy drug-eluting stents   Depression    pt denies   Diabetes mellitus type II dx'd in the 1990's   GERD (gastroesophageal reflux disease)    yrs ago   H/O hiatal hernia    Hemorrhoids    History of hypogonadism    History of kidney stones    Hyperlipidemia    Hypertension    OSA on CPAP    "mask is broken; working on getting a new one" (01/15/2015; 10/03/2017)   Pneumonia    "3-4 times" (10/03/2017)   Urinary incontinence     Past Surgical History:  Procedure Laterality Date   A-FLUTTER ABLATION N/A 09/12/2017   Procedure: A-FLUTTER ABLATION;  Surgeon: Deboraha Sprang, MD;  Location: [place] [facility];  Service: Cardiovascular;  Laterality: N/A;   CARDIAC DEFIBRILLATOR PLACEMENT  01/15/2015   CARDIOVERSION N/A 08/10/2017   Procedure: CARDIOVERSION;  Surgeon: Larey Dresser, MD;  Location: [facility] ENDOSCOPY;  Service: Cardiovascular;  Laterality: N/A;   CORONARY ANGIOPLASTY WITH  STENT PLACEMENT  ~ 2003   CORONARY ARTERY BYPASS GRAFT N/A 08/15/2012   Procedure: CORONARY ARTERY BYPASS GRAFTING (CABG);  Surgeon: Ivin Poot, MD;  Location: [place];  Service: Open Heart Surgery;  Laterality: N/A;  Coronary Artery Bypass Grafting Times Four Using Left Internal Mammary Artery and Right Saphenous Leg Vein Harvested Endoscopically   EP IMPLANTABLE DEVICE N/A 01/15/2015   Procedure: ICD Implant;  Surgeon: Deboraha Sprang, MD;  Location: [place] [facility];  Service: Cardiovascular;  Laterality: N/A;   LEFT HEART CATHETERIZATION WITH CORONARY ANGIOGRAM N/A 08/08/2012   Procedure: LEFT HEART CATHETERIZATION WITH CORONARY ANGIOGRAM;  Surgeon: Peter M Martinique, MD;  Location: [facility] CATH LAB;  Service: Cardiovascular;  Laterality: N/A;   LEFT HEART CATHETERIZATION WITH CORONARY/GRAFT ANGIOGRAM N/A 07/21/2014   Procedure: LEFT HEART CATHETERIZATION WITH Beatrix Fetters;  Surgeon: Larey Dresser, MD;  Location: [facility] CATH LAB;  Service: Cardiovascular;  Laterality: N/A;   MULTIPLE EXTRACTIONS WITH ALVEOLOPLASTY N/A 10/04/2017   Procedure: Extraction of tooth #'s 5 and 14 with alveoloplasty and gross debridement of remaining teeth;  Surgeon: [name], DDS;  Location: [place];  Service: Oral Surgery;  Laterality: N/A;   PERCUTANEOUS CORONARY STENT INTERVENTION (PCI-S)  07/21/2014   Procedure: PERCUTANEOUS CORONARY STENT INTERVENTION (PCI-S);  Surgeon: Larey Dresser, MD;  Location: [facility] CATH LAB;  Service: Cardiovascular;;   REFRACTIVE SURGERY Bilateral 1990's   RIGHT HEART CATH N/A 10/03/2017   Procedure: RIGHT HEART CATH;  Surgeon: Larey Dresser, MD;  Location: [place] [facility];  Service: Cardiovascular;  Laterality: N/A;   RIGHT HEART CATH N/A 12/11/2018   Procedure: RIGHT HEART CATH;  Surgeon: Larey Dresser, MD;  Location: [place] [facility];  Service: Cardiovascular;  Laterality: N/A;   RIGHT/LEFT HEART CATH AND CORONARY/GRAFT ANGIOGRAPHY N/A 07/29/2020   Procedure: RIGHT/LEFT  HEART CATH AND CORONARY/GRAFT ANGIOGRAPHY;  Surgeon: Larey Dresser, MD;  Location: [place] [facility];  Service: Cardiovascular;  Laterality: N/A;   TEE WITHOUT CARDIOVERSION N/A 08/10/2017   Procedure: TRANSESOPHAGEAL ECHOCARDIOGRAM (TEE);  Surgeon: Larey Dresser, MD;  Location: [facility] ENDOSCOPY;  Service: Cardiovascular;  Laterality: N/A;   TONSILLECTOMY  ~ [place] Bilateral 11/28/2017   Procedure: VIDEO BRONCHOSCOPY WITHOUT FLUORO;  Surgeon: Brand Males, MD;  Location: [facility] ENDOSCOPY;  Service: Endoscopy;  Laterality: Bilateral;    Family History  Problem Relation Age of Onset   Hypertension Mother    Diabetes Mother    Hypertension Father    Diabetes Father    Hyperlipidemia Father     Social History   Socioeconomic History   Marital status: Divorced    Spouse name: n/a   Number of children: 0   Years of education: 12th grade   Highest education level: Not on file  Occupational History   Occupation: Data processing manager ---self employed    Employer: [employer]  Tobacco Use   Smoking status: Never   Smokeless tobacco: Current    Types: Chew  Vaping Use   Vaping Use: Never used  Substance and Sexual Activity   Alcohol use: Yes    Alcohol/week: 3.0 standard drinks    Types: 3 Glasses of wine per week   Drug use: No   Sexual activity: Not Currently    Partners: Female  Other Topics Concern   Not on file  Social History Narrative   Exercise-- walking    Lives alone.   Brother lives in [place], [place]   Social Determinants of Health   Financial Resource Strain: Not on file  Food Insecurity: Not on file  Transportation Needs: Not on file  Physical Activity: Not on file  Stress: Not on file  Social Connections: Not on file  Intimate Partner Violence: Not on file    Outpatient Medications Prior to Visit  Medication Sig Dispense Refill   ACCU-CHEK FASTCLIX LANCETS MISC Use as directed once a day.  Dx code: E11.9 100  each 1   albuterol (ACCUNEB) 0.63 MG/3ML nebulizer solution Take 3 mLs (0.63 mg total) by nebulization every 6 (six) hours as needed for wheezing. 75 mL 12   albuterol (VENTOLIN HFA) 108 (90 Base) MCG/ACT inhaler Inhale 2 puffs into the lungs every 6 (six) hours as needed for wheezing or shortness of breath.      allopurinol (ZYLOPRIM) 300 MG tablet Take 1 tablet (300 mg total) by mouth daily. 30 tablet 6   apixaban (ELIQUIS) 5 MG TABS tablet TAKE 1 TABLET BY MOUTH 2 TIMES DAILY. 180 tablet 3   atorvastatin (LIPITOR) 40 MG tablet TAKE 1 TABLET BY MOUTH AT BEDTIME. 30 tablet 3   blood glucose meter kit and supplies Dispense based on patient and insurance preference. Use as directed once a day. Dx Code E11.9. 1 each 0   Blood Glucose Monitoring Suppl (ACCU-CHEK GUIDE) w/Device KIT 1 each  by Does not apply route daily. DX Code: E11.9 1 kit 0   carvedilol (COREG) 3.125 MG tablet Take 1 tablet (3.125 mg total) by mouth 2 (two) times daily. 180 tablet 3   Continuous Blood Gluc Sensor (FREESTYLE LIBRE 2 SENSOR) MISC 1 Device by Does not apply route as directed. 2 each 11   digoxin (LANOXIN) 0.125 MG tablet TAKE 1/2 TABLET BY MOUTH DAILY. 45 tablet 3   doxycycline (VIBRA-TABS) 100 MG tablet Take 1 tablet (100 mg total) by mouth 2 (two) times daily. 20 tablet 0   fluticasone-salmeterol (ADVAIR HFA) 230-21 MCG/ACT inhaler Inhale 2 puffs into the lungs 2 (two) times daily. 1 each 12   furosemide (LASIX) 20 MG tablet Take 1 tablet (20 mg total) by mouth daily. 90 tablet 3   glucose blood (ACCU-CHEK GUIDE) test strip Use as directed once a day.  Dx code: E11.9 100 each 1   guaiFENesin (MUCINEX) 600 MG 12 hr tablet Take 1 tablet (600 mg total) by mouth 2 (two) times daily as needed for to loosen phlegm.     icosapent Ethyl (VASCEPA) 1 g capsule Take 2 capsules (2 g total) by mouth 2 (two) times daily. 120 capsule 6   metFORMIN (GLUCOPHAGE) 500 MG tablet Take 1 tablet (500 mg total) by mouth 2 (two) times daily  with a meal. 180 tablet 1   Nintedanib (OFEV) 150 MG CAPS Take 1 capsule (150 mg total) by mouth in the morning and at bedtime. 180 capsule 1   nitroGLYCERIN (NITROSTAT) 0.4 MG SL tablet Place 1 tablet (0.4 mg total) under the tongue every 5 (five) minutes as needed for chest pain. 25 tablet 3   OXYGEN Inhale 3 L into the lungs daily as needed (low oxygen).     predniSONE (DELTASONE) 10 MG tablet TAKE 1 TABLET (10 MG TOTAL) BY MOUTH DAILY WITH BREAKFAST. 30 tablet 3   sacubitril-valsartan (ENTRESTO) 24-26 MG TAKE 1 TABLET BY MOUTH 2 (TWO) TIMES DAILY. 60 tablet 11   No facility-administered medications prior to visit.    No Known Allergies  ROS     Objective:    Physical Exam Constitutional:      General: He is not in acute distress.    Appearance: Normal appearance. He is not ill-appearing.  HENT:     Head: Normocephalic and atraumatic.     Right Ear: External ear normal.     Left Ear: External ear normal.  Eyes:     Extraocular Movements: Extraocular movements intact.     Pupils: Pupils are equal, round, and reactive to light.  Cardiovascular:     Rate and Rhythm: Normal rate and regular rhythm.     Pulses: Normal pulses.     Heart sounds: Normal heart sounds. No murmur heard.   No gallop.  Pulmonary:     Effort: Pulmonary effort is normal. No respiratory distress.     Breath sounds: Normal breath sounds. No wheezing, rhonchi or rales.  Skin:    General: Skin is warm and dry.  Neurological:     Mental Status: He is alert and oriented to person, place, and time.  Psychiatric:        Behavior: Behavior normal.    There were no vitals taken for this visit. Wt Readings from Last 3 Encounters:  12/23/20 154 lb 12.8 oz (70.2 kg)  12/14/20 156 lb 12.8 oz (71.1 kg)  11/26/20 157 lb 3.2 oz (71.3 kg)    Diabetic Foot Exam - Simple  No data filed    Lab Results  Component Value Date   WBC 11.8 (H) 12/23/2020   HGB 12.0 (L) 12/23/2020   HCT 37.2 (L) 12/23/2020   PLT  268.0 12/23/2020   GLUCOSE 88 12/27/2020   CHOL 145 06/14/2020   TRIG 178.0 (H) 06/14/2020   HDL 55.50 06/14/2020   LDLDIRECT 69.0 11/20/2017   LDLCALC 54 06/14/2020   ALT 37 12/23/2020   AST 46 (H) 12/23/2020   NA 139 12/27/2020   K 4.4 12/27/2020   CL 104 12/27/2020   CREATININE 1.52 (H) 12/27/2020   BUN 37 (H) 12/27/2020   CO2 24 12/27/2020   TSH 6.28 (H) 12/23/2020   PSA 0.26 09/21/2015   INR 1.4 (H) 12/03/2018   HGBA1C 6.8 (H) 06/14/2020   MICROALBUR 1.2 06/14/2020    Lab Results  Component Value Date   TSH 6.28 (H) 12/23/2020   Lab Results  Component Value Date   WBC 11.8 (H) 12/23/2020   HGB 12.0 (L) 12/23/2020   HCT 37.2 (L) 12/23/2020   MCV 118.2 Repeated and verified X2. (H) 12/23/2020   PLT 268.0 12/23/2020   Lab Results  Component Value Date   NA 139 12/27/2020   K 4.4 12/27/2020   CO2 24 12/27/2020   GLUCOSE 88 12/27/2020   BUN 37 (H) 12/27/2020   CREATININE 1.52 (H) 12/27/2020   BILITOT 0.7 12/23/2020   ALKPHOS 98 12/23/2020   AST 46 (H) 12/23/2020   ALT 37 12/23/2020   PROT 7.1 12/23/2020   ALBUMIN 3.7 12/23/2020   CALCIUM 9.1 12/27/2020   ANIONGAP 11 12/27/2020   GFR 39.15 (L) 12/23/2020   Lab Results  Component Value Date   CHOL 145 06/14/2020   Lab Results  Component Value Date   HDL 55.50 06/14/2020   Lab Results  Component Value Date   LDLCALC 54 06/14/2020   Lab Results  Component Value Date   TRIG 178.0 (H) 06/14/2020   Lab Results  Component Value Date   CHOLHDL 3 06/14/2020   Lab Results  Component Value Date   HGBA1C 6.8 (H) 06/14/2020       Assessment & Plan:   Problem List Items Addressed This Visit   None    No orders of the defined types were placed in this encounter.   I, Dr. Roma Schanz, DO, personally preformed the services described in this documentation.  All medical record entries made by the scribe were at my direction and in my presence.  I have reviewed the chart and discharge  instructions (if applicable) and agree that the record reflects my personal performance and is accurate and complete. 12/28/2020   I,Shehryar Baig,acting as a scribe for [employer], DO.,have documented all relevant documentation on the behalf of Ann Held, DO,as directed by  Ann Held, DO while in the presence of Ann Held, DO.   Shehryar [employer]

## 2020-12-30 ENCOUNTER — Ambulatory Visit (INDEPENDENT_AMBULATORY_CARE_PROVIDER_SITE_OTHER): Payer: Medicare HMO | Admitting: Family Medicine

## 2020-12-30 ENCOUNTER — Encounter: Payer: Self-pay | Admitting: Family Medicine

## 2020-12-30 ENCOUNTER — Other Ambulatory Visit: Payer: Self-pay

## 2020-12-30 ENCOUNTER — Ambulatory Visit (HOSPITAL_BASED_OUTPATIENT_CLINIC_OR_DEPARTMENT_OTHER)
Admission: RE | Admit: 2020-12-30 | Discharge: 2020-12-30 | Disposition: A | Discharge status: Home / Self Care | Payer: Medicare HMO | Source: Ambulatory Visit | Attending: Family Medicine | Admitting: Family Medicine

## 2020-12-30 VITALS — BP 98/80 | HR 121 | Temp 98.0°F | Resp 20 | Ht 69.0 in | Wt 153.0 lb

## 2020-12-30 DIAGNOSIS — L03116 Cellulitis of left lower limb: Secondary | ICD-10-CM

## 2020-12-30 DIAGNOSIS — M1A0721 Idiopathic chronic gout, left ankle and foot, with tophus (tophi): Secondary | ICD-10-CM | POA: Diagnosis not present

## 2020-12-30 DIAGNOSIS — R634 Abnormal weight loss: Secondary | ICD-10-CM

## 2020-12-30 LAB — CBC WITH DIFFERENTIAL/PLATELET
Basophils Absolute: 0 10*3/uL (ref 0.0–0.1)
Basophils Relative: 0.5 % (ref 0.0–3.0)
Eosinophils Absolute: 0.3 10*3/uL (ref 0.0–0.7)
Eosinophils Relative: 3.9 % (ref 0.0–5.0)
HCT: 35 % — ABNORMAL LOW (ref 39.0–52.0)
Hemoglobin: 11.2 g/dL — ABNORMAL LOW (ref 13.0–17.0)
Lymphocytes Relative: 25.5 % (ref 12.0–46.0)
Lymphs Abs: 1.7 10*3/uL (ref 0.7–4.0)
MCHC: 32.1 g/dL (ref 30.0–36.0)
MCV: 115.9 fl — ABNORMAL HIGH (ref 78.0–100.0)
Monocytes Absolute: 0.6 10*3/uL (ref 0.1–1.0)
Monocytes Relative: 9 % (ref 3.0–12.0)
Neutro Abs: 4.1 10*3/uL (ref 1.4–7.7)
Neutrophils Relative %: 61.1 % (ref 43.0–77.0)
Platelets: 315 10*3/uL (ref 150.0–400.0)
RBC: 3.02 Mil/uL — ABNORMAL LOW (ref 4.22–5.81)
RDW: 16 % — ABNORMAL HIGH (ref 11.5–15.5)
WBC: 6.7 10*3/uL (ref 4.0–10.5)

## 2020-12-30 MED ORDER — DOXYCYCLINE HYCLATE 100 MG PO TABS: 100.0000 mg | ORAL_TABLET | Freq: Two times a day (BID) | ORAL | 0 refills | Status: DC

## 2020-12-30 NOTE — Progress Notes (Signed)
Established Patient Office Visit  Subjective:  Patient ID: Vincent Chandler, male    DOB: 1961/02/27  Age: 60 y.o. MRN: [id number]  CC:  Chief Complaint  Patient presents with   Cellulitis   Gout   Follow-up    HPI Vincent Chandler presents for f/u cellulitis ----  still painful but redness has improved  He is very concerned about his weight loss and the gout -- sport med had drained it-- he would like it drained again  Past Medical History:  Diagnosis Date   AICD (automatic cardioverter/defibrillator) present    Anginal pain ([place])    Anxiety    Asthma    Atrial fibrillation-postoperative 11/28/2012   Cardiomyopathy    alcohol use related   CHF (congestive heart failure) (HCC)    Chronic back pain    Coronary artery disease    drug eluting stent RCA 2005-EF 30%- s/p CABG x 4; 2/4 patent grafts with SVG-PLOM and SVG-RCA system totally occluded.  There are collaterals from the LAD system to the RCA and the RCA is totally occluded proximally.  There are no collaterals to the LCx territory.  He then underwent successful PCI of the mid left circumflex artery with overlapping Synergy drug-eluting stents   Depression    pt denies   Diabetes mellitus type II dx'd in the 1990's   GERD (gastroesophageal reflux disease)    yrs ago   H/O hiatal hernia    Hemorrhoids    History of hypogonadism    History of kidney stones    Hyperlipidemia    Hypertension    OSA on CPAP    "mask is broken; working on getting a new one" (01/15/2015; 10/03/2017)   Pneumonia    "3-4 times" (10/03/2017)   Urinary incontinence     Past Surgical History:  Procedure Laterality Date   A-FLUTTER ABLATION N/A 09/12/2017   Procedure: A-FLUTTER ABLATION;  Surgeon: Deboraha Sprang, MD;  Location: [place] [facility];  Service: Cardiovascular;  Laterality: N/A;   CARDIAC DEFIBRILLATOR PLACEMENT  01/15/2015   CARDIOVERSION N/A 08/10/2017   Procedure: CARDIOVERSION;  Surgeon: Larey Dresser, MD;  Location: [facility] ENDOSCOPY;   Service: Cardiovascular;  Laterality: N/A;   CORONARY ANGIOPLASTY WITH STENT PLACEMENT  ~ 2003   CORONARY ARTERY BYPASS GRAFT N/A 08/15/2012   Procedure: CORONARY ARTERY BYPASS GRAFTING (CABG);  Surgeon: Ivin Poot, MD;  Location: [place];  Service: Open Heart Surgery;  Laterality: N/A;  Coronary Artery Bypass Grafting Times Four Using Left Internal Mammary Artery and Right Saphenous Leg Vein Harvested Endoscopically   EP IMPLANTABLE DEVICE N/A 01/15/2015   Procedure: ICD Implant;  Surgeon: Deboraha Sprang, MD;  Location: [place] [facility];  Service: Cardiovascular;  Laterality: N/A;   LEFT HEART CATHETERIZATION WITH CORONARY ANGIOGRAM N/A 08/08/2012   Procedure: LEFT HEART CATHETERIZATION WITH CORONARY ANGIOGRAM;  Surgeon: Peter M Martinique, MD;  Location: [facility] CATH LAB;  Service: Cardiovascular;  Laterality: N/A;   LEFT HEART CATHETERIZATION WITH CORONARY/GRAFT ANGIOGRAM N/A 07/21/2014   Procedure: LEFT HEART CATHETERIZATION WITH Beatrix Fetters;  Surgeon: Larey Dresser, MD;  Location: [facility] CATH LAB;  Service: Cardiovascular;  Laterality: N/A;   MULTIPLE EXTRACTIONS WITH ALVEOLOPLASTY N/A 10/04/2017   Procedure: Extraction of tooth #'s 5 and 14 with alveoloplasty and gross debridement of remaining teeth;  Surgeon: [name], DDS;  Location: [place];  Service: Oral Surgery;  Laterality: N/A;   PERCUTANEOUS CORONARY STENT INTERVENTION (PCI-S)  07/21/2014   Procedure: PERCUTANEOUS  CORONARY STENT INTERVENTION (PCI-S);  Surgeon: Larey Dresser, MD;  Location: [facility] CATH LAB;  Service: Cardiovascular;;   REFRACTIVE SURGERY Bilateral 1990's   RIGHT HEART CATH N/A 10/03/2017   Procedure: RIGHT HEART CATH;  Surgeon: Larey Dresser, MD;  Location: [place] [facility];  Service: Cardiovascular;  Laterality: N/A;   RIGHT HEART CATH N/A 12/11/2018   Procedure: RIGHT HEART CATH;  Surgeon: Larey Dresser, MD;  Location: [place] [facility];  Service: Cardiovascular;  Laterality: N/A;   RIGHT/LEFT HEART  CATH AND CORONARY/GRAFT ANGIOGRAPHY N/A 07/29/2020   Procedure: RIGHT/LEFT HEART CATH AND CORONARY/GRAFT ANGIOGRAPHY;  Surgeon: Larey Dresser, MD;  Location: [place] [facility];  Service: Cardiovascular;  Laterality: N/A;   TEE WITHOUT CARDIOVERSION N/A 08/10/2017   Procedure: TRANSESOPHAGEAL ECHOCARDIOGRAM (TEE);  Surgeon: Larey Dresser, MD;  Location: [facility] ENDOSCOPY;  Service: Cardiovascular;  Laterality: N/A;   TONSILLECTOMY  ~ [place] Bilateral 11/28/2017   Procedure: VIDEO BRONCHOSCOPY WITHOUT FLUORO;  Surgeon: Brand Males, MD;  Location: [facility] ENDOSCOPY;  Service: Endoscopy;  Laterality: Bilateral;    Family History  Problem Relation Age of Onset   Hypertension Mother    Diabetes Mother    Hypertension Father    Diabetes Father    Hyperlipidemia Father     Social History   Socioeconomic History   Marital status: Divorced    Spouse name: n/a   Number of children: 0   Years of education: 12th grade   Highest education level: Not on file  Occupational History   Occupation: Data processing manager ---self employed    Employer: [employer]  Tobacco Use   Smoking status: Never   Smokeless tobacco: Current    Types: Chew  Vaping Use   Vaping Use: Never used  Substance and Sexual Activity   Alcohol use: Yes    Alcohol/week: 3.0 standard drinks    Types: 3 Glasses of wine per week   Drug use: No   Sexual activity: Not Currently    Partners: Female  Other Topics Concern   Not on file  Social History Narrative   Exercise-- walking    Lives alone.   Brother lives in [place], [place]   Social Determinants of Health   Financial Resource Strain: Not on file  Food Insecurity: Not on file  Transportation Needs: Not on file  Physical Activity: Not on file  Stress: Not on file  Social Connections: Not on file  Intimate Partner Violence: Not on file    Outpatient Medications Prior to Visit  Medication Sig Dispense Refill    ACCU-CHEK FASTCLIX LANCETS MISC Use as directed once a day.  Dx code: E11.9 100 each 1   albuterol (ACCUNEB) 0.63 MG/3ML nebulizer solution Take 3 mLs (0.63 mg total) by nebulization every 6 (six) hours as needed for wheezing. 75 mL 12   albuterol (VENTOLIN HFA) 108 (90 Base) MCG/ACT inhaler Inhale 2 puffs into the lungs every 6 (six) hours as needed for wheezing or shortness of breath.      allopurinol (ZYLOPRIM) 300 MG tablet Take 1 tablet (300 mg total) by mouth daily. 30 tablet 6   apixaban (ELIQUIS) 5 MG TABS tablet TAKE 1 TABLET BY MOUTH 2 TIMES DAILY. 180 tablet 3   atorvastatin (LIPITOR) 40 MG tablet TAKE 1 TABLET BY MOUTH AT BEDTIME. 30 tablet 3   blood glucose meter kit and supplies Dispense based on patient and insurance preference. Use as directed once a day. Dx Code  E11.9. 1 each 0   Blood Glucose Monitoring Suppl (ACCU-CHEK GUIDE) w/Device KIT 1 each by Does not apply route daily. DX Code: E11.9 1 kit 0   carvedilol (COREG) 3.125 MG tablet Take 1 tablet (3.125 mg total) by mouth 2 (two) times daily. 180 tablet 3   Continuous Blood Gluc Sensor (FREESTYLE LIBRE 2 SENSOR) MISC 1 Device by Does not apply route as directed. 2 each 11   digoxin (LANOXIN) 0.125 MG tablet TAKE 1/2 TABLET BY MOUTH DAILY. 45 tablet 3   fluticasone-salmeterol (ADVAIR HFA) 230-21 MCG/ACT inhaler Inhale 2 puffs into the lungs 2 (two) times daily. 1 each 12   furosemide (LASIX) 20 MG tablet Take 1 tablet (20 mg total) by mouth daily. 90 tablet 3   glucose blood (ACCU-CHEK GUIDE) test strip Use as directed once a day.  Dx code: E11.9 100 each 1   guaiFENesin (MUCINEX) 600 MG 12 hr tablet Take 1 tablet (600 mg total) by mouth 2 (two) times daily as needed for to loosen phlegm.     icosapent Ethyl (VASCEPA) 1 g capsule Take 2 capsules (2 g total) by mouth 2 (two) times daily. 120 capsule 6   metFORMIN (GLUCOPHAGE) 500 MG tablet Take 1 tablet (500 mg total) by mouth 2 (two) times daily with a meal. 180 tablet 1    Nintedanib (OFEV) 150 MG CAPS Take 1 capsule (150 mg total) by mouth in the morning and at bedtime. 180 capsule 1   nitroGLYCERIN (NITROSTAT) 0.4 MG SL tablet Place 1 tablet (0.4 mg total) under the tongue every 5 (five) minutes as needed for chest pain. 25 tablet 3   OXYGEN Inhale 3 L into the lungs daily as needed (low oxygen).     predniSONE (DELTASONE) 10 MG tablet TAKE 1 TABLET (10 MG TOTAL) BY MOUTH DAILY WITH BREAKFAST. 30 tablet 3   sacubitril-valsartan (ENTRESTO) 24-26 MG TAKE 1 TABLET BY MOUTH 2 (TWO) TIMES DAILY. 60 tablet 11   doxycycline (VIBRA-TABS) 100 MG tablet Take 1 tablet (100 mg total) by mouth 2 (two) times daily. 20 tablet 0   No facility-administered medications prior to visit.    No Known Allergies  ROS Review of Systems  Constitutional:  Negative for appetite change, diaphoresis, fatigue and unexpected weight change.  Eyes:  Negative for pain, redness and visual disturbance.  Respiratory:  Negative for cough, chest tightness, shortness of breath and wheezing.   Cardiovascular:  Negative for chest pain, palpitations and leg swelling.  Endocrine: Negative for cold intolerance, heat intolerance, polydipsia, polyphagia and polyuria.  Genitourinary:  Negative for difficulty urinating, dysuria and frequency.  Skin:  Negative for color change.  Neurological:  Negative for dizziness, light-headedness, numbness and headaches.     Objective:    Physical Exam Vitals and nursing note reviewed.  Constitutional:      Appearance: He is well-developed.  HENT:     Head: Normocephalic and atraumatic.  Eyes:     Pupils: Pupils are equal, round, and reactive to light.  Neck:     Thyroid: No thyromegaly.  Cardiovascular:     Rate and Rhythm: Normal rate and regular rhythm.     Heart sounds: No murmur heard. Pulmonary:     Effort: Pulmonary effort is normal. No respiratory distress.     Breath sounds: Normal breath sounds. No wheezing or rales.  Chest:     Chest wall: No  tenderness.  Musculoskeletal:        General: No tenderness.  Cervical back: Normal range of motion and neck supple.  Skin:    General: Skin is warm and dry.     Findings: Erythema present.          Comments: L foot-- laterally + tender to touch Red ness has improved    Neurological:     Mental Status: He is alert and oriented to person, place, and time.  Psychiatric:        Behavior: Behavior normal.        Thought Content: Thought content normal.        Judgment: Judgment normal.    BP 98/80 (BP Location: Right Arm, Patient Position: Sitting, Cuff Size: Normal)   Pulse (!) 121   Temp 98 F (36.7 C) (Oral)   Resp 20   Ht _0  (1.753 m)   Wt 153 lb (69.4 kg)   SpO2 96%   BMI 22.59 kg/m  Wt Readings from Last 3 Encounters:  12/30/20 153 lb (69.4 kg)  12/23/20 154 lb 12.8 oz (70.2 kg)  12/14/20 156 lb 12.8 oz (71.1 kg)     Health Maintenance Due  Topic Date Due   COLONOSCOPY (Pts 45-71yr Insurance coverage will need to be confirmed)  Never done   Zoster Vaccines- Shingrix (1 of 2) Never done   FOOT EXAM  09/20/2016   OPHTHALMOLOGY EXAM  09/26/2018   COVID-19 Vaccine (3 - Booster for Moderna series) 02/02/2020   HEMOGLOBIN A1C  12/13/2020    There are no preventive care reminders to display for this patient.  Lab Results  Component Value Date   TSH 6.28 (H) 12/23/2020   Lab Results  Component Value Date   WBC 6.7 12/30/2020   HGB 11.2 (L) 12/30/2020   HCT 35.0 (L) 12/30/2020   MCV 115.9 Repeated and verified X2. (H) 12/30/2020   PLT 315.0 12/30/2020   Lab Results  Component Value Date   NA 139 12/27/2020   K 4.4 12/27/2020   CO2 24 12/27/2020   GLUCOSE 88 12/27/2020   BUN 37 (H) 12/27/2020   CREATININE 1.52 (H) 12/27/2020   BILITOT 0.7 12/23/2020   ALKPHOS 98 12/23/2020   AST 46 (H) 12/23/2020   ALT 37 12/23/2020   PROT 7.1 12/23/2020   ALBUMIN 3.7 12/23/2020   CALCIUM 9.1 12/27/2020   ANIONGAP 11 12/27/2020   GFR 39.15 (L) 12/23/2020    Lab Results  Component Value Date   CHOL 145 06/14/2020   Lab Results  Component Value Date   HDL 55.50 06/14/2020   Lab Results  Component Value Date   LDLCALC 54 06/14/2020   Lab Results  Component Value Date   TRIG 178.0 (H) 06/14/2020   Lab Results  Component Value Date   CHOLHDL 3 06/14/2020   Lab Results  Component Value Date   HGBA1C 6.8 (H) 06/14/2020      Assessment & Plan:   Problem List Items Addressed This Visit       Unprioritized   Cellulitis of left lower extremity - Primary   Relevant Medications   doxycycline (VIBRA-TABS) 100 MG tablet   Other Relevant Orders   CBC with Differential/Platelet (Completed)   DG Foot Complete Left   Gout    No improvement in pain Refer to rheum and pt will f/u sport med       Relevant Orders   Ambulatory referral to Rheumatology   Weight loss    Most likely do to chronic illness but he has never had his colonoscopy Will  refer to Gi for evaluation        Relevant Orders   Ambulatory referral to Gastroenterology    Meds ordered this encounter  Medications   doxycycline (VIBRA-TABS) 100 MG tablet    Sig: Take 1 tablet (100 mg total) by mouth 2 (two) times daily.    Dispense:  20 tablet    Refill:  0    Follow-up: Return in about 3 months (around 04/01/2021), or if symptoms worsen or fail to improve.    Ann Held, DO

## 2020-12-30 NOTE — Assessment & Plan Note (Signed)
Most likely do to chronic illness but he has never had his colonoscopy Will refer to Gi for evaluation

## 2020-12-30 NOTE — Patient Instructions (Signed)

## 2020-12-30 NOTE — Assessment & Plan Note (Signed)
No improvement in pain Refer to rheum and pt will f/u sport med

## 2021-01-02 DIAGNOSIS — I509 Heart failure, unspecified: Secondary | ICD-10-CM | POA: Diagnosis not present

## 2021-01-02 DIAGNOSIS — I2510 Atherosclerotic heart disease of native coronary artery without angina pectoris: Secondary | ICD-10-CM | POA: Diagnosis not present

## 2021-01-04 ENCOUNTER — Other Ambulatory Visit (HOSPITAL_COMMUNITY)
Admission: RE | Admit: 2021-01-04 | Discharge: 2021-01-04 | Disposition: A | Discharge status: Home / Self Care | Payer: Medicare HMO | Source: Ambulatory Visit | Attending: Internal Medicine | Admitting: Internal Medicine

## 2021-01-04 DIAGNOSIS — Z20822 Contact with and (suspected) exposure to covid-19: Secondary | ICD-10-CM | POA: Diagnosis not present

## 2021-01-04 DIAGNOSIS — Z01812 Encounter for preprocedural laboratory examination: Secondary | ICD-10-CM | POA: Diagnosis not present

## 2021-01-05 LAB — SARS CORONAVIRUS 2 (TAT 6-24 HRS): SARS Coronavirus 2: NEGATIVE

## 2021-01-06 ENCOUNTER — Encounter: Payer: Self-pay | Admitting: Internal Medicine

## 2021-01-06 ENCOUNTER — Ambulatory Visit (INDEPENDENT_AMBULATORY_CARE_PROVIDER_SITE_OTHER): Payer: Medicare HMO | Admitting: Internal Medicine

## 2021-01-06 ENCOUNTER — Ambulatory Visit (INDEPENDENT_AMBULATORY_CARE_PROVIDER_SITE_OTHER): Payer: Self-pay | Admitting: Internal Medicine

## 2021-01-06 ENCOUNTER — Telehealth: Payer: Self-pay | Admitting: Family Medicine

## 2021-01-06 ENCOUNTER — Other Ambulatory Visit: Payer: Self-pay

## 2021-01-06 ENCOUNTER — Telehealth: Payer: Self-pay | Admitting: Internal Medicine

## 2021-01-06 VITALS — BP 108/58 | HR 101 | Ht 70.0 in | Wt 154.0 lb

## 2021-01-06 DIAGNOSIS — N189 Chronic kidney disease, unspecified: Secondary | ICD-10-CM | POA: Diagnosis not present

## 2021-01-06 DIAGNOSIS — Z5181 Encounter for therapeutic drug level monitoring: Secondary | ICD-10-CM | POA: Diagnosis not present

## 2021-01-06 DIAGNOSIS — J9611 Chronic respiratory failure with hypoxia: Secondary | ICD-10-CM

## 2021-01-06 DIAGNOSIS — J849 Interstitial pulmonary disease, unspecified: Secondary | ICD-10-CM

## 2021-01-06 DIAGNOSIS — R5381 Other malaise: Secondary | ICD-10-CM | POA: Diagnosis not present

## 2021-01-06 DIAGNOSIS — I5022 Chronic systolic (congestive) heart failure: Secondary | ICD-10-CM

## 2021-01-06 DIAGNOSIS — J84112 Idiopathic pulmonary fibrosis: Secondary | ICD-10-CM

## 2021-01-06 DIAGNOSIS — G4733 Obstructive sleep apnea (adult) (pediatric): Secondary | ICD-10-CM | POA: Diagnosis not present

## 2021-01-06 DIAGNOSIS — L03116 Cellulitis of left lower limb: Secondary | ICD-10-CM

## 2021-01-06 DIAGNOSIS — R634 Abnormal weight loss: Secondary | ICD-10-CM

## 2021-01-06 DIAGNOSIS — I272 Pulmonary hypertension, unspecified: Secondary | ICD-10-CM

## 2021-01-06 LAB — PULMONARY FUNCTION TEST
DL/VA % pred: 46 %
DL/VA: 1.97 ml/min/mmHg/L
DLCO cor % pred: 22 %
DLCO cor: 6.32 ml/min/mmHg
DLCO unc % pred: 22 %
DLCO unc: 6.32 ml/min/mmHg
FEF 25-75 Pre: 2.45 L/sec
FEF2575-%Pred-Pre: 80 %
FEV1-%Pred-Pre: 50 %
FEV1-Pre: 1.84 L
FEV1FVC-%Pred-Pre: 115 %
FEV6-%Pred-Pre: 45 %
FEV6-Pre: 2.09 L
FEV6FVC-%Pred-Pre: 104 %
FVC-%Pred-Pre: 43 %
FVC-Pre: 2.09 L
Pre FEV1/FVC ratio: 88 %
Pre FEV6/FVC Ratio: 100 %

## 2021-01-06 LAB — HEPATIC FUNCTION PANEL
ALT: 10 U/L (ref 0–53)
AST: 10 U/L (ref 0–37)
Albumin: 3.6 g/dL (ref 3.5–5.2)
Alkaline Phosphatase: 56 U/L (ref 39–117)
Bilirubin, Direct: 0.1 mg/dL (ref 0.0–0.3)
Total Bilirubin: 0.4 mg/dL (ref 0.2–1.2)
Total Protein: 6.5 g/dL (ref 6.0–8.3)

## 2021-01-06 LAB — COMPREHENSIVE METABOLIC PANEL
ALT: 10 U/L (ref 0–53)
AST: 10 U/L (ref 0–37)
Albumin: 3.6 g/dL (ref 3.5–5.2)
Alkaline Phosphatase: 56 U/L (ref 39–117)
BUN: 50 mg/dL — ABNORMAL HIGH (ref 6–23)
CO2: 27 mEq/L (ref 19–32)
Calcium: 9.6 mg/dL (ref 8.4–10.5)
Chloride: 103 mEq/L (ref 96–112)
Creatinine, Ser: 1.58 mg/dL — ABNORMAL HIGH (ref 0.40–1.50)
GFR: 47.6 mL/min — ABNORMAL LOW (ref >60.00)
Glucose, Bld: 100 mg/dL — ABNORMAL HIGH (ref 70–99)
Potassium: 4.7 mEq/L (ref 3.5–5.1)
Sodium: 141 mEq/L (ref 135–145)
Total Bilirubin: 0.4 mg/dL (ref 0.2–1.2)
Total Protein: 6.5 g/dL (ref 6.0–8.3)

## 2021-01-06 LAB — POTASSIUM: Potassium: 4.7 mEq/L (ref 3.5–5.1)

## 2021-01-06 NOTE — Telephone Encounter (Signed)
[name]: seeing Vincent Chandler after nearly 2 year.  He has significantly declined from his pulmonary fibrosis and is in chronic hypoxemic respiratory failure.  There is also additional weight loss which could be nintedanib or pulmonary fibrosis mediated  I heard you are on vacation at this moment.  I will be on vacation July 11-23.  Meanwhile  Thinking of changing his nintedanib to pirfenidone given his decline  2.  Is there a role for right heart catheterization to evaluate for WHO group 3 pulmonary hypertension?Marland Kitchen  Although I fully understand low EF patients were excluded from treprostinil   3.  Thinking of making a referral for lung transplant with the question of associated heart transplant as well.  Do not know your thoughts on this.  Is He is even a candidate for heart transplant?  Given the fact you are on vacation we will make a provisional referral to lung transplant at [facility] and see what they say  4.  Lot of research protocols will exclude him because of his associated heart failure so even t clinical trials as a care option is not a good option.  I see he is on a [place] stim trial which will exclude him for pulmonary fibrosis trials anyways  [place]  MR

## 2021-01-06 NOTE — Addendum Note (Signed)
Addended by: Suzzanne Cloud E on: 01/06/2021 02:17 PM   Modules accepted: Orders

## 2021-01-06 NOTE — Progress Notes (Signed)
PCP Ann Held, DO  HPI  IOV 09/25/2017  Chief Complaint  Patient presents with   Consult    Referred by Dr. Loralie Champagne for ILD.  HRCT 3/19/189.  Pt denies any complaint of cough, SOB or CP.     60 year old male with chronic systolic heart failure secondary to ischemic heart disease with chronic kidney disease and diabetes and obstructive sleep apnea not yet on CPAP treatment.  This concerned now that he has interstitial lung disease/pulmonary fibrosis not otherwise specified and therefore has been referred here by the cardiology heart failure team  History is gained from talking to the patient and review of the chart.  According to him he had coronary artery bypass grafting in 2014 that then left him with chronic systolic heart failure.  Then in 2016 he had a stent placed and had an admission for that (CT angio  At that time - effusion +/- interistial prominence).  After that he has been admission free up until early February 2019 when he was admitted for acute on chronic systolic heart failure.  During this time he was in atrial fibrillation and then according to his history was loaded with IV amiodarone [it appears he might of failed cardioversion] he was diuresed and discharged on oral amiodarone on August 17, 2017 after a 10-day hospital stay..  With the diuresis he continued to feel better and his shortness of breath and cough improved although he had significant improvement with shortness of breath more than cough.  Just prior to discharge from the hospital he had CT chest (non-HRCT that suggested ILD). Pulmonary function testing same day 08/16/17 that showed moderate restriction with significant severe reduction in diffusion capacity.  Then on August 20, 2017 he had a transesophageal echocardiogram with ejection fraction 15%.  This was then followed with a pulmonary stress test on August 24, 2017 that showed a VO2 max of 12 with high risk heart failure features.   He did desaturate below 90%. Then on September 12, 2017 he had status post ablation for his atrial fibrillation and was able to come off the amiodarone.  Since coming off the amiodarone his cough is continued to improve and he relates his improvement in cough to stopping the amiodarone.  Then in 09/17/17 - > HRCT  - read by Thoracic radiology as Probable UIP (> 50-80% this is UIP; review of prior CT shows heart failure features - so this is new finding. Personally visualized CT).   And on 09/25/2017 => walk test - Walking desaturation test on 09/25/2017 185 feet x 3 laps on ROOM AIR:  did walk all 3 at normal pace. Did not have complaints. Didnot desaturate. Rest pulse ox was 98%, final pulse ox was 90%. HR response 72/min at rest to 88/min at peak exertion. Patient Vincent Chandler  Did no6 Desaturate < 88% . [name] did yes  Desaturated </= 3% points. [name] did not get tachyardic. These features are c/w MILD/EARLY ILD   Noted: he is considering for LVAD but CVTS feels he is high risk due to ILD and RV failure. Not clear if transplant is part of his goal   ACCP ILD question  - cough x 6 months. 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 No honeycombing inspiratory and expiratory imaging no acute consolidative airspace disease. No pleural effusions. No  suspicious appearing pulmonary nodules or masses are noted.   1. The appearance of the lungs is compatible with interstitial lung disease. The pattern is considered a probable usual interstitial pneumonia (UIP) CT pattern, however, at this time, no definitive honeycombing is identified. Outpatient referral to Pulmonology for further evaluation is suggested in the near future if not already obtained. Repeat high-resolution chest CT is recommended in 12 months to assess for temporal changes in the appearance of the lung parenchyma. 2. Aortic atherosclerosis, in addition to 3 vessel coronary artery disease. Patient is status post median sternotomy for CABG including LIMA to the LAD. 3. Additional incidental findings, as above.   Aortic Atherosclerosis (ICD10-I70.0).     Electronically Signed   By: Vinnie Langton M.D.   [name] 10/30/2017  Chief Complaint  Patient presents with   Follow-up    PFT done today. Pt states he has been doing well since last visit and denies any complaints or concerns other than some mild back pain.     Walking desaturation test on 10/30/2017 185 feet x 3 laps on ROOM AIR:  did not  Desaturate to < 88%. Rest pulse ox was 97%, final pulse ox was 90%. HR response 60/min at rest to 70/min at peak exertion. Patient Vincent Chandler  Did not Desaturate < 88% . [name] did yes  Desaturated </= 3% points. [name] did not get tachyardic. Moderate pace . Denied complaints  Results for MONICO, SUDDUTH (MRN [id number]) as of 10/30/2017 17:01  Ref. Range 09/25/2017 17:03  ASPERGILLUS FUMIGATUS Latest Ref Range: NEGATIVE  NEGATIVE  Pigeon Serum Latest Ref Range: NEGATIVE  NEGATIVE  Anit Nuclear Antibody(ANA) Latest Ref Range: Negative  Negative  Cyclic Citrullin Peptide Ab Latest Units: UNITS <16  ds DNA Ab Latest Units: IU/mL 5 (H)  Myeloperoxidase Abs Latest Units: AI <1.0  Serine Protease 3 Latest Units: AI 2.9 (H)  RA Latex Turbid. Latest Ref Range: <14 IU/mL <14   SSA (Ro) (ENA) Antibody, IgG Latest Ref Range: <1.0 NEG AI <1.0 NEG  SSB (La) (ENA) Antibody, IgG Latest Ref Range: <1.0 NEG AI <1.0 NEG  Scleroderma (Scl-70) (ENA) Antibody, IgG Latest Ref Range: <1.0 NEG AI <1.0 NEG   Results for VIREN, LEBEAU (MRN [id number]) as of 10/30/2017 17:01  Ref. Range 08/16/2017 14:34 10/30/2017 14:45  FVC-Pre Latest Units: L 2.89 2.81  FVC-%Pred-Pre Latest Units: % 61 59    Results for DAVANTE, GERKE (MRN [id number]) as of 10/30/2017 17:01  Ref. Range 08/16/2017 14:34 10/30/2017 14:45  TLC Latest Units: L 4.09 3.85  TLC % pred Latest Units: % 60 56   Results for PERRIN, GENS (MRN [id number]) as of 10/30/2017 17:01  Ref. Range 09/25/2017 17:03  Sed Rate Latest Ref Range: 0 - 20 mm/hr 18   RHC 10/03/2017  RA mean 5 RV 51/5 PA 54/18, mean 31 PCWP mean 12 Oxygen saturations: PA 66% AO 96% Cardiac Output (Fick) 5.22  Cardiac Index (Fick) 2.51 PVR 3.6 WU    OV 12/27/2017  Chief Complaint  Patient presents with   Follow-up    Pt states he has been doing okay. States he has been coughing up clear phlegm x4 weeks and states his nose becomes stopped up at night. Pt also has had c/o wheezing. Denies any chest tightness.    Vincent Chandler , 60 y.o. , with dob 05/25/1961 and male ,Not Hispanic or Latino from [place] [address] - presents to ILD  clinic for followup after bronchoscopy and bronchoalveolar lavage. Results show that he has 70% macro phage [normal is greater than 90% macrophage) and 8% eosinophils [normal is less than 5% eosinophils) which could be reflective of drug induced pneumonitis. Patter is definitely not IPF/UIPO. In the interim he tells me that he speak of a cough now for the last 1 month associated with chest tightness and nocturnal wheezing and sinus drainage. This is definitely new. I did a history retake and he tells me he has been off the amiodarone for over 3 months. In this time he's also been on Entresto for 3 months  [this is known to cause cough]. He feels he needs an inhaler. This no hemoptysis no nausea no vomiting no chills or any other associated symptoms.Marland Kitchen He feels his asthma is coming back. We did exhaled nitric oxide test today - 39 ppb and inderterminate range (< 25 normal, , > 50 abnormal)    Results for AMIRE, LEAZER (MRN [id number]) as of 12/27/2017 14:25  Ref. Range 11/28/2017 14:12  Color, Fluid Latest Ref Range: YELLOW  COLORLESS (A)  WBC, Fluid Latest Ref Range: 0 - 1,000 cu mm 137  Lymphs, Fluid Latest Units: % 12  Eos, Fluid Latest Units: % 8  Appearance, Fluid Latest Ref Range: CLEAR  CLEAR (A)  Other Cells, Fluid Latest Units: % CORRELATE WITH CY...  Neutrophil Count, Fluid Latest Ref Range: 0 - 25 % 10  Monocyte-Macrophage-Serous Fluid Latest Ref Range: 50 - 90 % 70     OV 02/20/2019  Subjective:  Patient ID: Vincent Chandler, male , DOB: [birth year] , age 78 y.o. , MRN: [id number] , ADDRESS: [place] [id number]   02/20/2019 -   Chief Complaint  Patient presents with   Follow-up   ILD followup - suspected amio lung toxicity  HPI SUNG PARODI 60 y.o. -presents for followup. Last seen June 2019 by self. At that time amio lung tox dx given based on hx and BAL . Started on prednisone and tapered to 52m per day. Intent was to do 3-9 month course but he never followed up. THen in June 2020 found to have new exertional dyspnea. Dr MAundra Dubindid cath - PCWP 12 and lasix reduced (Creat 2 with GFR 50s now). Feeling was ex hypoxemia was not related to chf - low ef. Seen pulm NP and 2 months ago started on exertional o2 and nigh o2. 3 weeks ago started on CPAP which he feels helps. Despite exertional hypoxemia he feels he is only some worse. PFT shows decline in FVC below but not dlco as much. HRCT visualized. Prob UIP pattern being called with some possible progression   Antifbirotic ability - on anticoagulants an has IHD - Dr MAundra Dubincomfortable accepting possible CAD.  LFT normal aug 2020. GFR 50s. He is aware of possible GI side effects. He is ok adding antifbrotic if MDs conferred and felt indicated given poor outlook for progressive ILD     ROS - per HPI  11/26/2020 - interim hx  Patient presents today for overdue follow-up. He was last seen July 2021 by pulmonary NP and was recommended to follow-up in 2 months. Breathing is worse over the last 3-4 month. Increased fatigue and more weakness in legs. No significant cough. He has been consistently taking his OFEV, he only has 3 days left of medication. Needs OFEV renewed. He has not been on Dulera severals months ago, tells me he just stopped taking it. He is on prednisone 51m daily.  He is using 4L oxygen. He takes lasix 448mdaily. Weight has been stable. No diarrhea, appetite is ok. Overdue for spiro with DLCO, 6MWT and CMET.      Airview download 02/12/19-03/13/19 - Usage 12/30 days (40%); 9/30 (30%) > 4 hours - Average usage 5 hours 13 mins - Pressure 5-15cm h20 (10.4cm h20 95% percentile) - Moderate air leaks - AHI 3.2  Significant testing reviewed: ECHO TEE 08/20/17 - EF 15%  (CPST 08/24/17 - high risk CHF features with Vo2 max 12 and also desaturation  < 90%)   HRCT IMPRESSION 09/17/17- The appearance of the lungs is compatible with interstitial lung disease. The pattern is considered a probable usual interstitial pneumonia (UIP) CT pattern, however, at this time, no definitive honeycombing is identified  HRCT IMPRESSION 12/27/18- I mages demonstrate widespread areas of ground-glass attenuation, septal thickening, subpleural reticulation, thickening of the peribronchovascular interstitium, mild cylindrical traction bronchiectasis and regional areas of architectural distortion. No frank honeycombing is confidently identified at this time. Findings have a definitive craniocaudal gradient and appear minimally progressive compared to the prior study. Findings in the lungs remain compatible with interstitial  lung disease, with a pattern considered probable usual interstitial pneumonia (UIP) per current ATS guidelines.       OV 01/06/2021  Subjective:  Patient ID: ToLucio Edwardmale , DOB: 07/1960-04-12 age 60.o. , MRN: [id number] ADDRESS: [address] [id number] LoCarollee HerterYvAlferd ApaDO Patient Care Team: LoCarollee HerterYvAlferd ApaDO as PCP - General (Family Medicine) McLarey DresserMD as PCP - Advanced Heart Failure (Cardiology) GeBurtis JunesNP (Inactive) as Nurse Practitioner (Nurse Practitioner) KlDeboraha SprangMD as Consulting Physician (Cardiology) HaAllyn KennerMD (Dermatology) Day, KaMelvenia BeamRPGastroenterology Consultants Of San Antonio Stone CreekInactive) as Pharmacist (Pharmacist)  This Provider for this visit: Treatment Team:  Attending Provider: RaBrand MalesMD    01/06/2021 -   Chief Complaint  Patient presents with   Follow-up    PFT performed today.  Pt states he feels like he has gotten worse since last visit. Has an occasional cough as well as increased SOB. Pt also has some complaints of chest discomfort.    ILD followup - suspected amio lung toxicity  On nintedanib since the end of 2020  HPI Pace W Bealer 60 y.o. -returns for follow-up.  Personally have not seen him in 2 years.  Towards end of 2020 nurse practitioner started him on nintedanib.  He is also on prednisone 10 mg/day.  He thinks is because of his lung disease.  He says he is progressively worse.  In between he is on nurse practitioners.  In the summer 2021 last year he had a CT scan of the chest his CT scan changed from probable UIP to definitive UIP with progression.  This is progressive phenotype.  Initial thought was this was amiodarone lung toxicity.  However he is behaving like IPF patient.  He is progressing despite nintedanib.  His symptom scores are much  worse.  He had pulmonary function test shown below and is significantly worse.  He is on 3-4 L oxygen all the time.  He has lost a lot of weight according to his  history.  He says he is also not that physically strong.  When he stands up it is difficult for him to walk steady.  He did demonstrate his ability to stand up and walk after walking 5 to 6 feet he did get a little bit wobbly.  He is now cachectic.  He lives alone with some of the elderly person.  He does not appear to have family.  His symptom score shows significant deterioration.  In terms of his heart failure echocardiogram last year still shows persistent cardiomyopathy with low EF.  I do not see a right heart catheterization on him.  He has seen Dr. Loralie Champagne recently.  He is not aware of cardiac transplantation is in the works.     SYMPTOM SCALE - ILD 02/20/2019   11/26/2020 ofev 01/06/2021 ofev  O2 use ra + occ prn ex o2 3-4L  3-4:   Shortness of Breath 0 -> 5 scale with 5 being worst (score 6 If unable to do) 0 -> 5 scale with 5 being worst (score 6 If unable to do)   At rest 1 2.5 2  Simple tasks - showers, clothes change, eating, shaving 2 3.5 4  Household (dishes, doing bed, laundry) _0 Shopping _1 Walking keeping up with others of same age 76 3.5 4  Walking up Stairs _2 Total (40 - 48) Dyspnea Score 17 24.5 25  How bad is your cough? _3 How bad is your fatigue _4 nausea0   1  Vo,iot   1  diarrhea     anxiety   4  deoression   2      PFT  PFT Results Latest Ref Rng & Units 01/06/2021 01/16/2019 10/30/2017 08/16/2017  FVC-Pre L 2.09 2.58 2.81 2.89  FVC-Predicted Pre % 43 52 59 61  FVC-Post L - 2.59 2.84 2.86  FVC-Predicted Post % - 53 60 60  Pre FEV1/FVC % % 88 84 84 84  Post FEV1/FCV % % - 83 84 86  FEV1-Pre L 1.84 2.16 2.35 2.44  FEV1-Predicted Pre % 50 58 65 67  FEV1-Post L - 2.16 2.39 2.47  DLCO uncorrected ml/min/mmHg 6.32 11.18 8.81 8.35  DLCO UNC% % 22 39 28 26  DLCO corrected ml/min/mmHg 6.32 - 9.16 8.06  DLCO COR %Predicted % 22 - 29 26  DLVA Predicted % 46 61 52 45  TLC L - 4.00 3.85 4.09  TLC % Predicted % - 57 56 60  RV % Predicted  % - 41 56 54    HRCT Aug 2021 TECHNIQUE: Multidetector CT imaging of the chest was performed following the standard protocol without intravenous contrast. High resolution imaging of the lungs, as well as inspiratory and expiratory imaging, was performed.   COMPARISON:  12/27/2018 high-resolution chest CT.   FINDINGS: Cardiovascular: Stable mild cardiomegaly. Single lead left subclavian ICD with lead tip in the right ventricular apex. No significant pericardial effusion/thickening. Three-vessel coronary atherosclerosis status post CABG. Atherosclerotic nonaneurysmal thoracic aorta. Top-normal caliber main pulmonary artery (3.1 cm diameter). Aberrant nonaneurysmal right subclavian artery arising from the distal aortic arch with retroesophageal course.   Mediastinum/Nodes: No discrete thyroid nodules. Unremarkable esophagus. No pathologically enlarged axillary, mediastinal or hilar lymph nodes,  noting limited sensitivity for the detection of hilar adenopathy on this noncontrast study.   Lungs/Pleura: No pneumothorax. No pleural effusion. No acute consolidative airspace disease, lung masses or significant pulmonary nodules. No significant lobular air trapping or evidence of tracheobronchomalacia on the expiration sequence. There is extensive patchy confluent subpleural and peripheral peribronchovascular reticulation and ground-glass opacity throughout both lungs with associated moderate traction bronchiectasis, volume loss and distortion. There is a strong basilar predominance to these findings. Probable early honeycombing at the right costophrenic angle. Findings have clearly progressed in the interval.   Upper abdomen: No acute abnormality.   Musculoskeletal: No aggressive appearing focal osseous lesions. Stable symmetric mild bilateral gynecomastia. Intact sternotomy wires. Mild thoracic spondylosis.   IMPRESSION: 1. Spectrum of findings compatible with basilar predominant  fibrotic interstitial lung disease with probable early honeycombing. Findings have clearly progressed in the interval. Findings are consistent with UIP per consensus guidelines: Diagnosis of Idiopathic Pulmonary Fibrosis: An Official ATS/ERS/JRS/ALAT Clinical Practice Guideline. [place], Iss 5, [phone], Mar 03 2017. 2. Stable mild cardiomegaly. 3. Aberrant nonaneurysmal right subclavian artery. 4. Aortic Atherosclerosis (ICD10-I70.0).     Electronically Signed   By: Ilona Sorrel M.D.   On: 02/06/2020 19:09    has a past medical history of AICD (automatic cardioverter/defibrillator) present, Anginal pain ([place]), Anxiety, Asthma, Atrial fibrillation-postoperative (11/28/2012), Cardiomyopathy, CHF (congestive heart failure) ([place]), Chronic back pain, Coronary artery disease, Depression, Diabetes mellitus type II (dx'd in the 1990's), GERD (gastroesophageal reflux disease), H/O hiatal hernia, Hemorrhoids, History of hypogonadism, History of kidney stones, Hyperlipidemia, Hypertension, OSA on CPAP, Pneumonia, and Urinary incontinence.   reports that he has never smoked. His smokeless tobacco use includes chew.  Past Surgical History:  Procedure Laterality Date   A-FLUTTER ABLATION N/A 09/12/2017   Procedure: A-FLUTTER ABLATION;  Surgeon: Deboraha Sprang, MD;  Location: [facility];  Service: Cardiovascular;  Laterality: N/A;   CARDIAC DEFIBRILLATOR PLACEMENT  01/15/2015   CARDIOVERSION N/A 08/10/2017   Procedure: CARDIOVERSION;  Surgeon: Larey Dresser, MD;  Location: [facility] ENDOSCOPY;  Service: Cardiovascular;  Laterality: N/A;   CORONARY ANGIOPLASTY WITH STENT PLACEMENT  ~ 2003   CORONARY ARTERY BYPASS GRAFT N/A 08/15/2012   Procedure: CORONARY ARTERY BYPASS GRAFTING (CABG);  Surgeon: Ivin Poot, MD;  Location: [facility];  Service: Open Heart Surgery;  Laterality: N/A;  Coronary Artery Bypass Grafting Times Four Using Left Internal Mammary Artery and Right Saphenous  Leg Vein Harvested Endoscopically   EP IMPLANTABLE DEVICE N/A 01/15/2015   Procedure: ICD Implant;  Surgeon: Deboraha Sprang, MD;  Location: [place] [facility];  Service: Cardiovascular;  Laterality: N/A;   LEFT HEART CATHETERIZATION WITH CORONARY ANGIOGRAM N/A 08/08/2012   Procedure: LEFT HEART CATHETERIZATION WITH CORONARY ANGIOGRAM;  Surgeon: Peter M Martinique, MD;  [facility] C[facility] LAB;  Service: Cardiovascular;  Laterality: N/A;   LEFT HEART CATHETERIZATION WITH CORONARY/GRAFT ANGIOGRAM N/A 07/21/2014   Procedure: LEFT HEART CATHETERIZATION WITH Beatrix Fetters;  Surgeon: Larey Dresser, MD;  Location[facility] CATH LAB;  Service: Cardiovascular;  Laterality: N/A;   MULTIPLE EXTRACTIONS WITH ALVEOLOPLASTY N/A 10/04/2017   Procedure: Extraction of tooth #'s 5 and 14 with alveoloplasty and gross debridement of remaining teeth;  Surgeon: [name], DDS;  Location: [facility] [facility];  Service: Oral Surgery;  Laterality: N/A;   PERCUTANEOUS CORONARY STENT INTERVENTION (PCI-S)  07/21/2014   Procedure: PERCUTANEOUS CORONARY STENT INTERVENTION (PCI-S);  Surgeon: Larey Dresser, MD;  Location: [facility] CATH LAB;  Service: Cardiovascular;;   REFRACTIVE SURGERY Bilateral 1990's   RIGHT HEART CATH N/A 10/03/2017   Procedure: RIGHT HEART CATH;  Surgeon: Larey Dresser, MD;  Location: [place] [facility];  Service: Cardiovascular;  Laterality: N/A;   RIGHT HEART CATH N/A 12/11/2018   Procedure: RIGHT HEART CATH;  Surgeon: Larey Dresser, MD;  Location: [place] [facility];  Service: Cardiovascular;  Laterality: N/A;   RIGHT/LEFT HEART CATH AND CORONARY/GRAFT ANGIOGRAPHY N/A 07/29/2020   Procedure: RIGHT/LEFT HEART CATH AND CORONARY/GRAFT ANGIOGRAPHY;  Surgeon: Larey Dresser, MD;  Location: [place] [facility];  Service: Cardiovascular;  Laterality: N/A;   TEE WITHOUT CARDIOVERSION N/A 08/10/2017   Procedure: TRANSESOPHAGEAL ECHOCARDIOGRAM (TEE);  Surgeon: Larey Dresser, MD;  Location: [facility] ENDOSCOPY;  Service:  Cardiovascular;  Laterality: N/A;   TONSILLECTOMY  ~ [place] Bilateral 11/28/2017   Procedure: VIDEO BRONCHOSCOPY WITHOUT FLUORO;  Surgeon: Brand Males, MD;  Location: [facility] ENDOSCOPY;  Service: Endoscopy;  Laterality: Bilateral;    No Known Allergies  Immunization History  Administered Date(s) Administered   Influenza Inj Mdck Quad Pf 06/14/2018   Influenza Split 06/05/2012   Influenza Whole 05/09/2010   Influenza,inj,Quad PF,6+ Mos 04/09/2013, 06/05/2014, 08/17/2017, 04/07/2019   Influenza-Unspecified 04/03/2015   Moderna Sars-Covid-2 Vaccination 07/29/2019, 09/02/2019   Pneumococcal Conjugate-13 06/05/2014   Pneumococcal Polysaccharide-23 02/17/2012   Td 05/09/2010, 02/07/2019    Family History  Problem Relation Age of Onset   Hypertension Mother    Diabetes Mother    Hypertension Father    Diabetes Father    Hyperlipidemia Father      Current Outpatient Medications:    ACCU-CHEK FASTCLIX LANCETS MISC, Use as directed once a day.  Dx code: E11.9, Disp: 100 each, Rfl: 1   albuterol (ACCUNEB) 0.63 MG/3ML nebulizer solution, Take 3 mLs (0.63 mg total) by nebulization every 6 (six) hours as needed for wheezing., Disp: 75 mL, Rfl: 12   albuterol (VENTOLIN HFA) 108 (90 Base) MCG/ACT inhaler, Inhale 2 puffs into the lungs every 6 (six) hours as needed for wheezing or shortness of breath. , Disp: , Rfl:    allopurinol (ZYLOPRIM) 300 MG tablet, Take 1 tablet (300 mg total) by mouth daily., Disp: 30 tablet, Rfl: 6   apixaban (ELIQUIS) 5 MG TABS tablet, TAKE 1 TABLET BY MOUTH 2 TIMES DAILY., Disp: 180 tablet, Rfl: 3   atorvastatin (LIPITOR) 40 MG tablet, TAKE 1 TABLET BY MOUTH AT BEDTIME., Disp: 30 tablet, Rfl: 3   blood glucose meter kit and supplies, Dispense based on patient and insurance preference. Use as directed once a day. Dx Code E11.9., Disp: 1 each, Rfl: 0   Blood Glucose Monitoring Suppl (ACCU-CHEK GUIDE) w/Device KIT, 1 each by Does not apply route  daily. DX Code: E11.9, Disp: 1 kit, Rfl: 0   carvedilol (COREG) 3.125 MG tablet, Take 1 tablet (3.125 mg total) by mouth 2 (two) times daily., Disp: 180 tablet, Rfl: 3   Continuous Blood Gluc Sensor (FREESTYLE LIBRE 2 SENSOR) MISC, 1 Device by Does not apply route as directed., Disp: 2 each, Rfl: 11   fluticasone-salmeterol (ADVAIR HFA) 081-44 MCG/ACT inhaler, Inhale 2 puffs into the lungs 2 (two) times daily., Disp: 1 each, Rfl: 12   furosemide (LASIX) 20 MG tablet, Take 1 tablet (20 mg total) by mouth daily., Disp: 90 tablet, Rfl: 3   glucose blood (ACCU-CHEK GUIDE) test strip, Use as directed once a day.  Dx code: E11.9, Disp: 100 each, Rfl: 1   guaiFENesin (  MUCINEX) 600 MG 12 hr tablet, Take 1 tablet (600 mg total) by mouth 2 (two) times daily as needed for to loosen phlegm., Disp: , Rfl:    icosapent Ethyl (VASCEPA) 1 g capsule, Take 2 capsules (2 g total) by mouth 2 (two) times daily., Disp: 120 capsule, Rfl: 6   metFORMIN (GLUCOPHAGE) 500 MG tablet, Take 1 tablet (500 mg total) by mouth 2 (two) times daily with a meal., Disp: 180 tablet, Rfl: 1   Nintedanib (OFEV) 150 MG CAPS, Take 1 capsule (150 mg total) by mouth in the morning and at bedtime., Disp: 180 capsule, Rfl: 1   OXYGEN, Inhale 3 L into the lungs daily as needed (low oxygen)., Disp: , Rfl:    predniSONE (DELTASONE) 10 MG tablet, TAKE 1 TABLET (10 MG TOTAL) BY MOUTH DAILY WITH BREAKFAST., Disp: 30 tablet, Rfl: 3   sacubitril-valsartan (ENTRESTO) 24-26 MG, TAKE 1 TABLET BY MOUTH 2 (TWO) TIMES DAILY., Disp: 60 tablet, Rfl: 11   digoxin (LANOXIN) 0.125 MG tablet, TAKE 1/2 TABLET BY MOUTH DAILY. (Patient not taking: Reported on 01/06/2021), Disp: 45 tablet, Rfl: 3   nitroGLYCERIN (NITROSTAT) 0.4 MG SL tablet, Place 1 tablet (0.4 mg total) under the tongue every 5 (five) minutes as needed for chest pain. (Patient not taking: Reported on 01/06/2021), Disp: 25 tablet, Rfl: 3      Objective:   Vitals:   01/06/21 1324  BP: (!) 108/58   Pulse: (!) 101  SpO2: 98%  Weight: 154 lb (69.9 kg)  Height: 5' 10" (1.778 m)    Estimated body mass index is 22.1 kg/m as calculated from the following:   Height as of this encounter: 5' 10" (1.778 m).   Weight as of this encounter: 154 lb (69.9 kg).  _0 @  Filed Weights   01/06/21 1324  Weight: 154 lb (69.9 kg)     Physical Exam Frail male sitting in a wheelchair.  He was able to stand up and walk several feet for me.  Then he got wobbly a bit.  We did a 1 legged stand test he was lasted only 1 second.  He is got bruises of prednisone on his forearms.  He is on oxygen.  Crackles are not all that obvious.  No visible respiratory distress normal heart sounds.  Heart sounds normal abdomen soft      Assessment:       ICD-10-CM   1. ILD (interstitial lung disease) (HCC)  J84.9 Comp Met (CMET)    Hepatic function panel    Ambulatory referral to Pulmonology    Ambulatory referral to Physical Therapy    Potassium    2. UIP (usual interstitial pneumonitis) ([place])  J84.112     3. Chronic respiratory failure with hypoxia (HCC)  J96.11 Ambulatory referral to Physical Therapy    4. Medication monitoring encounter  Z51.81 Hepatic function panel    5. Chronic kidney disease, unspecified CKD stage  N18.9 Comp Met (CMET)    Potassium    6. Weight loss  R63.4     7. Physical deconditioning  R53.81 Ambulatory referral to Physical Therapy         Plan:     Patient Instructions     ICD-10-CM   1. ILD (interstitial lung disease) (HCC)  J84.9 Comp Met (CMET)    Hepatic function panel    Ambulatory referral to Pulmonology    2. UIP (usual interstitial pneumonitis) ([place])  J84.112     3. Chronic respiratory failure with hypoxia (HCC)  J96.11     4. Medication monitoring encounter  Z51.81 Hepatic function panel    5. Chronic kidney disease, unspecified CKD stage  N18.9 Comp Met (CMET)    6. Weight loss  R63.4     7. Physical deconditioning  R53.81       You  are having progressive pulmonary fibrosis despite nintedanib You are having significant weight loss which could be because of pulmonary fibrosis and heart failure but also nintedanib  Plan  - Check liver function test and chemistry panel today -when showed your liver, kidney and potassium are normal -Refer pharmacist in our office to switch you to pirfenidone   -This drug can also cause significant GI side effects were to having accelerated worsening of lung function and so were trying this is the next alternate standard of care option -You probably will not qualify for clinical trials and pulmonary fibrosis is a care option because of your heart failure -I have written to Dr. Loralie Champagne to see if you need a right heart catheterization to see if you will qualify for inhaled Tyvaso  -However this drug can also be contraindicated in the presence of heart failure  - This drug is to improve your symptoms and quality of life -Refer to [facility] lung transplantation program  -It is possible that on account of your heart failure you could be excluded from a heart lung transplant but I want to make a referral and assess -Continue nintedanib for now -Continue prednisone 10 mg/day for now  -We will try to reduce this in the future -Refer home physical therapy  Follow-up - 4 weeks with nurse practitioner -8-12 weeks with [name] 30-minute visit  -Symptom scores and walking desaturation test at follow-up  ( Level 05 visit: Estb 40-54 min    in  visit type: on-site physical face to visit  in total care time and counseling or/and coordination of care by this undersigned MD - Dr Brand Males. This includes one or more of the following on this same day 01/06/2021: pre-charting, chart review, note writing, documentation discussion of test results, diagnostic or treatment recommendations, prognosis, risks and benefits of management options, instructions, education, compliance or risk-factor  reduction. It excludes time spent by the [place] or office staff in the care of the patient. Actual time 25 min)   SIGNATURE    Dr. Brand Males, M.D., F.C.C.P,  Pulmonary and Critical Care Medicine Staff Physician, [place] Director - Interstitial Lung Disease  Program  Pulmonary [place] [facility] [place], [place], [address]  Pager: [phone], If no answer or between  15:00h - 7:00h: call [phone] Telephone: [phone]  2:16 PM 01/06/2021

## 2021-01-06 NOTE — Patient Instructions (Addendum)
ICD-10-CM   1. ILD (interstitial lung disease) (HCC)  J84.9 Comp Met (CMET)    Hepatic function panel    Ambulatory referral to Pulmonology    2. UIP (usual interstitial pneumonitis) ([place])  J84.112     3. Chronic respiratory failure with hypoxia (HCC)  J96.11     4. Medication monitoring encounter  Z51.81 Hepatic function panel    5. Chronic kidney disease, unspecified CKD stage  N18.9 Comp Met (CMET)    6. Weight loss  R63.4     7. Physical deconditioning  R53.81       You are having progressive pulmonary fibrosis despite nintedanib You are having significant weight loss which could be because of pulmonary fibrosis and heart failure but also nintedanib  Plan  - Check liver function test and chemistry panel today -when showed your liver, kidney and potassium are normal -Refer pharmacist in our office to switch you to pirfenidone   -This drug can also cause significant GI side effects were to having accelerated worsening of lung function and so were trying this is the next alternate standard of care option -You probably will not qualify for clinical trials and pulmonary fibrosis is a care option because of your heart failure -I have written to Dr. Loralie Champagne to see if you need a right heart catheterization to see if you will qualify for inhaled Tyvaso  -However this drug can also be contraindicated in the presence of heart failure  - This drug is to improve your symptoms and quality of life -Refer to [facility] lung transplantation program  -It is possible that on account of your heart failure you could be excluded from a heart lung transplant but I want to make a referral and assess -Continue nintedanib for now -Continue prednisone 10 mg/day for now  -We will try to reduce this in the future -Refer home physical therapy  Follow-up - 4 weeks with nurse practitioner -8-12 weeks with [name] 30-minute visit  -Symptom scores and walking desaturation test at  follow-up

## 2021-01-06 NOTE — Telephone Encounter (Signed)
1. He had a fairly recent RHC and does have pulmonary arterial hypertension.  I tried him on Tyvaso but he did not tolerate it.  2. I do think a referral for combined heart/lung transplant would be a good idea.

## 2021-01-06 NOTE — Telephone Encounter (Signed)
Pt seen on 12/30/20 for cellulites and gout f/u. Pt given Doxycycline. Please advise for refill?

## 2021-01-06 NOTE — Telephone Encounter (Signed)
Patient states that Dr. Etter Sjogren stated she might need to extend his antibotics for Gout. It is getting better however he feels like he will need more.

## 2021-01-06 NOTE — Progress Notes (Signed)
Spirometry and dlco done today. 

## 2021-01-06 NOTE — Telephone Encounter (Signed)
[name]  Ok thanks. SOrry somehow did not realize that he already had his right heart cath.  I will document that he did not tolerate Tyvaso 2. Raquel Sarna in [place]: Please make sure that the transplant referral is for combined heart and lung to [facility]  Repl;y not needed

## 2021-01-07 ENCOUNTER — Telehealth: Payer: Self-pay | Admitting: Internal Medicine

## 2021-01-07 MED ORDER — DOXYCYCLINE HYCLATE 100 MG PO TABS: 100.0000 mg | ORAL_TABLET | Freq: Two times a day (BID) | ORAL | 0 refills | Status: DC

## 2021-01-07 NOTE — Telephone Encounter (Signed)
Will route to [name] for update.  [name], please see message regarding delay in PT. Thanks!

## 2021-01-07 NOTE — Telephone Encounter (Signed)
Pt called. VM left

## 2021-01-07 NOTE — Telephone Encounter (Signed)
Please see msg below from [facility] regarding order for PT and advise.  Artis Delay M I meant to ask if a delay in start of care is okay. We will not be able to see until mid-week next week. Please advise.  Thanks   [employer]

## 2021-01-07 NOTE — Telephone Encounter (Signed)
When pt was at the office 7/7, referral to [facility] was only placed for lung transplant. I have placed one for pt to be referred to [facility] for cardiology transplant as well.

## 2021-01-07 NOTE — Addendum Note (Signed)
Addended by: Lorretta Harp on: 01/07/2021 11:33 AM   Modules accepted: Orders

## 2021-01-08 NOTE — Telephone Encounter (Signed)
Ok to start PT Next 1-2 weeks. Also make cardipulm rehab referral to start in 2 months as prep towards transplant

## 2021-01-10 NOTE — Telephone Encounter (Signed)
Spoke with Tasia. She has let [facility] know about MR's recommendations.   Will close encounter.

## 2021-01-11 ENCOUNTER — Telehealth: Payer: Self-pay | Admitting: Internal Medicine

## 2021-01-11 NOTE — Telephone Encounter (Signed)
Plan from last OV 01/06/21:  - Check liver function test and chemistry panel today -when showed your liver, kidney and potassium are normal -Refer pharmacist in our office to switch you to pirfenidone             -This drug can also cause significant GI side effects were to having accelerated worsening of lung function and so were trying this is the next alternate standard of care option -You probably will not qualify for clinical trials and pulmonary fibrosis is a care option because of your heart failure -I have written to Dr. Loralie Champagne to see if you need a right heart catheterization to see if you will qualify for inhaled Tyvaso             -However this drug can also be contraindicated in the presence of heart failure             - This drug is to improve your symptoms and quality of life -Refer to [facility] lung transplantation program             -It is possible that on account of your heart failure you could be excluded from a heart lung transplant but I want to make a referral and assess -Continue nintedanib for now -Continue prednisone 10 mg/day for now             -We will try to reduce this in the future -Refer home physical therapy   Follow-up - 4 weeks with nurse practitioner -8-12 weeks with [name] 30-minute visit             -Symptom scores and walking desaturation test at follow-up   Attempted to call pt but unable to reach. Left message for him to return call.  At last OV, pt was unable to schedule follow up appts due to his ride being in a rush to get out of the office.  Pt needs to have the following scheduled: Appt with [facility], pharmacist to get pt switched from OFEV to Esbriet Appt 4 weeks (or next avail) with APP Appt with MR 8-12 weeks (108mn OV)

## 2021-01-12 DIAGNOSIS — I700 Atherosclerosis of aorta: Secondary | ICD-10-CM | POA: Diagnosis not present

## 2021-01-12 DIAGNOSIS — J849 Interstitial pulmonary disease, unspecified: Secondary | ICD-10-CM | POA: Diagnosis not present

## 2021-01-12 DIAGNOSIS — J9611 Chronic respiratory failure with hypoxia: Secondary | ICD-10-CM | POA: Diagnosis not present

## 2021-01-12 DIAGNOSIS — G4733 Obstructive sleep apnea (adult) (pediatric): Secondary | ICD-10-CM | POA: Diagnosis not present

## 2021-01-12 DIAGNOSIS — Z7901 Long term (current) use of anticoagulants: Secondary | ICD-10-CM | POA: Diagnosis not present

## 2021-01-12 DIAGNOSIS — N189 Chronic kidney disease, unspecified: Secondary | ICD-10-CM | POA: Diagnosis not present

## 2021-01-12 DIAGNOSIS — E1122 Type 2 diabetes mellitus with diabetic chronic kidney disease: Secondary | ICD-10-CM | POA: Diagnosis not present

## 2021-01-12 DIAGNOSIS — Z7984 Long term (current) use of oral hypoglycemic drugs: Secondary | ICD-10-CM | POA: Diagnosis not present

## 2021-01-12 DIAGNOSIS — J84112 Idiopathic pulmonary fibrosis: Secondary | ICD-10-CM | POA: Diagnosis not present

## 2021-01-14 ENCOUNTER — Telehealth: Payer: Self-pay | Admitting: Internal Medicine

## 2021-01-14 NOTE — Telephone Encounter (Signed)
Spoke to [place] with [facility], who is requesting verbal order for PT once weekly for 5 weeks and OT. [name] would like this addressed prior to MR returning to office on 01/25/21. She has not contact PCP, as MR Is referring provider on file  Dr. Halford Chessman, please advise. Thanks

## 2021-01-14 NOTE — Telephone Encounter (Signed)
Okay to send order. 

## 2021-01-14 NOTE — Telephone Encounter (Signed)
Called and spoke with Morton. She verbalized understanding.   Nothing further needed at time of call.

## 2021-01-20 ENCOUNTER — Other Ambulatory Visit: Payer: Self-pay

## 2021-01-20 ENCOUNTER — Ambulatory Visit: Payer: Medicare HMO | Admitting: Pharmacist

## 2021-01-20 DIAGNOSIS — J849 Interstitial pulmonary disease, unspecified: Secondary | ICD-10-CM

## 2021-01-20 DIAGNOSIS — Z79899 Other long term (current) drug therapy: Secondary | ICD-10-CM

## 2021-01-20 NOTE — Progress Notes (Signed)
Subjective:   Patient presents today to [facility] to see pharmacy team for Esbriet new start.   Patient was last seen and referred by [name] on 01/06/21.  Pertinent past medical history includes ILD, OSA, CAD s/p CABG, CHF (followed by Dr. [name]), T2DM, CKD III. Prior therapy includes Ofev - cost has recently increased (grant expiration). Per chart review, attempt was made for patient assistance but he did not qualify and has since been sustained via grant enrollment. He has serious hesitation with changing therapy to Esbriet since he has tolerated Ofev without any side effects  History of elevated LFTs: No History of diarrhea, nausea, vomiting: No  Objective: No Known Allergies  Outpatient Encounter Medications as of 01/20/2021  Medication Sig   ACCU-CHEK FASTCLIX LANCETS MISC Use as directed once a day.  Dx code: E11.9   albuterol (ACCUNEB) 0.63 MG/3ML nebulizer solution Take 3 mLs (0.63 mg total) by nebulization every 6 (six) hours as needed for wheezing.   albuterol (VENTOLIN HFA) 108 (90 Base) MCG/ACT inhaler Inhale 2 puffs into the lungs every 6 (six) hours as needed for wheezing or shortness of breath.    allopurinol (ZYLOPRIM) 300 MG tablet Take 1 tablet (300 mg total) by mouth daily.   apixaban (ELIQUIS) 5 MG TABS tablet TAKE 1 TABLET BY MOUTH 2 TIMES DAILY.   atorvastatin (LIPITOR) 40 MG tablet TAKE 1 TABLET BY MOUTH AT BEDTIME.   blood glucose meter kit and supplies Dispense based on patient and insurance preference. Use as directed once a day. Dx Code E11.9.   Blood Glucose Monitoring Suppl (ACCU-CHEK GUIDE) w/Device KIT 1 each by Does not apply route daily. DX Code: E11.9   carvedilol (COREG) 3.125 MG tablet Take 1 tablet (3.125 mg total) by mouth 2 (two) times daily.   Continuous Blood Gluc Sensor (FREESTYLE LIBRE 2 SENSOR) MISC 1 Device by Does not apply route as directed.   digoxin (LANOXIN) 0.125 MG tablet TAKE 1/2 TABLET BY MOUTH DAILY. (Patient not taking:  Reported on 01/06/2021)   doxycycline (VIBRA-TABS) 100 MG tablet Take 1 tablet (100 mg total) by mouth 2 (two) times daily.   fluticasone-salmeterol (ADVAIR HFA) 230-21 MCG/ACT inhaler Inhale 2 puffs into the lungs 2 (two) times daily.   furosemide (LASIX) 20 MG tablet Take 1 tablet (20 mg total) by mouth daily.   glucose blood (ACCU-CHEK GUIDE) test strip Use as directed once a day.  Dx code: E11.9   guaiFENesin (MUCINEX) 600 MG 12 hr tablet Take 1 tablet (600 mg total) by mouth 2 (two) times daily as needed for to loosen phlegm.   icosapent Ethyl (VASCEPA) 1 g capsule Take 2 capsules (2 g total) by mouth 2 (two) times daily.   metFORMIN (GLUCOPHAGE) 500 MG tablet Take 1 tablet (500 mg total) by mouth 2 (two) times daily with a meal.   Nintedanib (OFEV) 150 MG CAPS Take 1 capsule (150 mg total) by mouth in the morning and at bedtime.   nitroGLYCERIN (NITROSTAT) 0.4 MG SL tablet Place 1 tablet (0.4 mg total) under the tongue every 5 (five) minutes as needed for chest pain. (Patient not taking: Reported on 01/06/2021)   OXYGEN Inhale 3 L into the lungs daily as needed (low oxygen).   predniSONE (DELTASONE) 10 MG tablet TAKE 1 TABLET (10 MG TOTAL) BY MOUTH DAILY WITH BREAKFAST.   sacubitril-valsartan (ENTRESTO) 24-26 MG TAKE 1 TABLET BY MOUTH 2 (TWO) TIMES DAILY.   No facility-administered encounter medications on file as of 01/20/2021.  Immunization History  Administered Date(s) Administered   Influenza Inj Mdck Quad Pf 06/14/2018   Influenza Split 06/05/2012   Influenza Whole 05/09/2010   Influenza,inj,Quad PF,6+ Mos 04/09/2013, 06/05/2014, 08/17/2017, 04/07/2019   Influenza-Unspecified 04/03/2015   Moderna Sars-Covid-2 Vaccination 07/29/2019, 09/02/2019   Pneumococcal Conjugate-13 06/05/2014   Pneumococcal Polysaccharide-23 02/17/2012   Td 05/09/2010, 02/07/2019      PFT's TLC  Date Value Ref Range Status  01/16/2019 4.00 L Final      CMP     Component Value Date/Time   NA 141  01/06/2021 1418   NA 138 07/10/2018 1700   K 4.7 01/06/2021 1418   K 4.7 01/06/2021 1418   CL 103 01/06/2021 1418   CO2 27 01/06/2021 1418   GLUCOSE 100 (H) 01/06/2021 1418   BUN 50 (H) 01/06/2021 1418   BUN 30 (H) 07/10/2018 1700   CREATININE 1.58 (H) 01/06/2021 1418   CREATININE 2.12 (H) 11/26/2020 1648   CALCIUM 9.6 01/06/2021 1418   PROT 6.5 01/06/2021 1418   PROT 6.5 01/06/2021 1418   PROT 6.2 07/10/2018 1700   ALBUMIN 3.6 01/06/2021 1418   ALBUMIN 3.6 01/06/2021 1418   ALBUMIN 3.8 07/10/2018 1700   AST 10 01/06/2021 1418   AST 10 01/06/2021 1418   ALT 10 01/06/2021 1418   ALT 10 01/06/2021 1418   ALKPHOS 56 01/06/2021 1418   ALKPHOS 56 01/06/2021 1418   BILITOT 0.4 01/06/2021 1418   BILITOT 0.4 01/06/2021 1418   BILITOT 0.3 07/10/2018 1700   GFRNONAA 52 (L) 12/27/2020 1232   GFRAA 36 (L) 01/26/2020 1028      CBC    Component Value Date/Time   WBC 6.7 12/30/2020 1335   RBC 3.02 (L) 12/30/2020 1335   HGB 11.2 (L) 12/30/2020 1335   HGB 14.3 08/21/2018 1500   HCT 35.0 (L) 12/30/2020 1335   HCT 41.0 08/21/2018 1500   PLT 315.0 12/30/2020 1335   PLT 204 08/21/2018 1500   MCV 115.9 Repeated and verified X2. (H) 12/30/2020 1335   MCV 110 (H) 08/21/2018 1500   MCH 37.3 (H) 07/21/2020 1218   MCHC 32.1 12/30/2020 1335   RDW 16.0 (H) 12/30/2020 1335   RDW 13.8 08/21/2018 1500   LYMPHSABS 1.7 12/30/2020 1335   LYMPHSABS 1.4 07/10/2018 1700   MONOABS 0.6 12/30/2020 1335   EOSABS 0.3 12/30/2020 1335   EOSABS 0.2 07/10/2018 1700   BASOSABS 0.0 12/30/2020 1335   BASOSABS 0.0 07/10/2018 1700      LFT's Hepatic Function Latest Ref Rng & Units 01/06/2021 01/06/2021 12/23/2020  Total Protein 6.0 - 8.3 g/dL 6.5 6.5 7.1  Albumin 3.5 - 5.2 g/dL 3.6 3.6 3.7  AST 0 - 37 U/L 10 10 46(H)  ALT 0 - 53 U/L 10 10 37  Alk Phosphatase 39 - 117 U/L 56 56 98  Total Bilirubin 0.2 - 1.2 mg/dL 0.4 0.4 0.7  Bilirubin, Direct 0.0 - 0.3 mg/dL - 0.1 -      HRCT (02/06/20): Spectrum of  findings compatible with basilar predominant fibrotic interstitial lung disease with probable early honeycombing. Findings have clearly progressed in the interval. Findings are consistent with UIP per consensus guidelines  Assessment and Plan  Esbriet Medication Management Thoroughly counseled patient on the efficacy, mechanism of action, dosing, administration, adverse effects, and monitoring parameters of Esbriet.  Patient verbalized understanding. Patient education handout provided.   Goals of Therapy: Will not stop or reverse the progression of ILD. It will slow the progression of ILD.  - We  reviewed that [name] believes that Jacklynn Bue is worth trying as an alternative antifibrotic since his HRCT has shown ILD progression despite Ofev. We reviewed that Esbriet is not necessarily more effective than Ofev but may provide benefit that Ofev has not. - He was initially sure that he wanted to remain on Ofev due to potential for nausea with Esbriet, but after detailed discussion - including reviewing that Esbriet is started at low dose and up-titrated over the course of several weeks as well as close follow-up with providers to assess for response, he is amenable to moving forward with Esbriet BIV to see if he is approved. He would also proactively likely nausea medications to have on hand when/if he starts.  He is open to a trial of Esbriet but still remains hesitant. I also reviewed that Ofev had the potential to cause nausea and diarrhea and he tolerated it well, and that our hope is that he is able to tolerate Esbriet with same success.  Dosing: Starting dose will be Esbriet 267 mg 1 tablet three times daily for 7 days, then 2 tablets three times daily for 7 days, then 3 tablets three times daily.  Maintenance dose will be 801 mg 1 tablet three times daily if tolerated.  Stressed the importance of taking with meals and space at least 5-6 hours apart to minimize stomach upset.   Adverse  Effects: Nausea, vomiting, diarrhea, weight loss Abdominal pain GERD Sun sensitivity/rash - patient advised to wear sunscreen when exposed to sunlight Dizziness Fatigue  Monitoring: Monitor for diarrhea, nausea and vomiting, GI perforation, hepatotoxicity  Monitor LFTs - baseline, monthly for first 6 months, then every 3 months routinely CBC w differential at baseline and every 3 months routinely  Access: Will start Esbriet BIV. Patient portion completed today. Provider portion will be placed in Dr. Golden Pop mailbox. Also completed Ofev BI Cares PAP paperwork to file away into his chart in case he is unable to tolerate Esbriet and wants to restart Ofev in future. He is aware that Ofev paperwork requires income documentation and what we have on file is outdated from several years ago. He will plan to find 3 months of Social Security deposits or letter from [employer] that has explanation of benefits to return to clinic.   Medication Reconciliation A drug regimen assessment was performed, including review of allergies, interactions, disease-state management, dosing and immunization history. Medications were reviewed with the patient, including name, instructions, indication, goals of therapy, potential side effects, importance of adherence, and safe use.  Immunizations Patient is indicated for the influenzae, pneumonia, and shingles vaccinations. Patient has received 2 COVID19 vaccines.  This appointment required 60 minutes of patient care (this includes precharting, chart review, review of results, face-to-face care, etc.).  Thank you for involving pharmacy to assist in providing this patient's care.   Knox Saliva, PharmD, MPH, BCPS Clinical Pharmacist (Rheumatology and Pulmonology)

## 2021-01-21 ENCOUNTER — Telehealth: Payer: Self-pay | Admitting: Pharmacist

## 2021-01-21 DIAGNOSIS — Z5181 Encounter for therapeutic drug level monitoring: Secondary | ICD-10-CM

## 2021-01-21 DIAGNOSIS — J849 Interstitial pulmonary disease, unspecified: Secondary | ICD-10-CM

## 2021-01-21 NOTE — Telephone Encounter (Signed)
Please start Esbriet BIV.  Patient assistance paperwork pending signature from [name]. Placed in his mailbox today  Knox Saliva, PharmD, MPH, BCPS Clinical Pharmacist (Rheumatology and Pulmonology)

## 2021-01-21 NOTE — Patient Instructions (Addendum)
We will investigate Esbriet coverage for you. This process may take 2-3 weeks but pharmacy team will be in touch with an update  We will also complete Ofev paperwork so that we have it handy if we need it in the future. This will require income documentation. Don't worry about providing this until it is needed.  Thank you for taking the time to review Esbriet with me. Reach out with questions: [phone]

## 2021-01-24 ENCOUNTER — Other Ambulatory Visit (HOSPITAL_COMMUNITY): Payer: Self-pay

## 2021-01-24 MED FILL — Sacubitril-Valsartan Tab 24-26 MG: ORAL | 30 days supply | Qty: 60 | Fill #3 | Status: AC

## 2021-01-24 NOTE — Telephone Encounter (Signed)
Submitted an expedited Prior Authorization request to [facility] for ESBRIET via CoverMyMeds. Will update once we receive a response.  Key: [id number]  Knox Saliva, PharmD, MPH, BCPS Clinical Pharmacist (Rheumatology and Pulmonology)

## 2021-01-24 NOTE — Telephone Encounter (Addendum)
Received notification from [facility] regarding a prior authorization for ESBRIET. Authorization has been APPROVED from 07/03/20 to 07/02/21. Placed prior auth approval letter in "awaiting response" folder  Copay is $109 for 30 day supply. Patient is currently in catastrophic and copay during beginning of 2023 would be higher therefore he will need patient assistance.  Awaiting [employer] patient assistance forms from [name]. Placed in his mailbox on 01/21/21.  Authorization # [id number]  Knox Saliva, PharmD, MPH, BCPS Clinical Pharmacist (Rheumatology and Pulmonology)

## 2021-01-25 ENCOUNTER — Encounter (HOSPITAL_COMMUNITY): Payer: Medicare HMO

## 2021-01-26 DIAGNOSIS — I129 Hypertensive chronic kidney disease with stage 1 through stage 4 chronic kidney disease, or unspecified chronic kidney disease: Secondary | ICD-10-CM | POA: Diagnosis not present

## 2021-01-26 DIAGNOSIS — D631 Anemia in chronic kidney disease: Secondary | ICD-10-CM | POA: Diagnosis not present

## 2021-01-26 DIAGNOSIS — N189 Chronic kidney disease, unspecified: Secondary | ICD-10-CM | POA: Diagnosis not present

## 2021-01-26 DIAGNOSIS — N2581 Secondary hyperparathyroidism of renal origin: Secondary | ICD-10-CM | POA: Diagnosis not present

## 2021-01-26 DIAGNOSIS — N1832 Chronic kidney disease, stage 3b: Secondary | ICD-10-CM | POA: Diagnosis not present

## 2021-01-27 ENCOUNTER — Other Ambulatory Visit: Payer: Self-pay | Admitting: Internal Medicine

## 2021-01-28 DIAGNOSIS — Z7901 Long term (current) use of anticoagulants: Secondary | ICD-10-CM | POA: Diagnosis not present

## 2021-01-28 DIAGNOSIS — E1122 Type 2 diabetes mellitus with diabetic chronic kidney disease: Secondary | ICD-10-CM | POA: Diagnosis not present

## 2021-01-28 DIAGNOSIS — N189 Chronic kidney disease, unspecified: Secondary | ICD-10-CM | POA: Diagnosis not present

## 2021-01-28 DIAGNOSIS — I700 Atherosclerosis of aorta: Secondary | ICD-10-CM | POA: Diagnosis not present

## 2021-01-28 DIAGNOSIS — Z7984 Long term (current) use of oral hypoglycemic drugs: Secondary | ICD-10-CM | POA: Diagnosis not present

## 2021-01-28 DIAGNOSIS — J849 Interstitial pulmonary disease, unspecified: Secondary | ICD-10-CM | POA: Diagnosis not present

## 2021-01-28 DIAGNOSIS — G4733 Obstructive sleep apnea (adult) (pediatric): Secondary | ICD-10-CM | POA: Diagnosis not present

## 2021-01-28 DIAGNOSIS — J9611 Chronic respiratory failure with hypoxia: Secondary | ICD-10-CM | POA: Diagnosis not present

## 2021-01-28 DIAGNOSIS — J84112 Idiopathic pulmonary fibrosis: Secondary | ICD-10-CM | POA: Diagnosis not present

## 2021-01-28 NOTE — Telephone Encounter (Signed)
Signature obtained; patient/provider forms faxed along with medication list, copy of insurance card, and [place] approval letter. Awaiting determination.  Phone# [phone] Fax# [phone]

## 2021-01-31 ENCOUNTER — Telehealth: Payer: Self-pay | Admitting: Internal Medicine

## 2021-01-31 NOTE — Telephone Encounter (Signed)
Called and spoke with Vin at [facility]. He wanted to confirm that the patient was still on Ofev. I advised him that based on the last few telephone notes, the patient is in the process of switching to [place]. He verbalized understanding and transferred me over to the pharmacist, Burman Nieves. I advised her of the same information and she verbalized understanding. She wanted to know if he was getting the Esbriet from another pharmacy. I advised her that the Esbriet RX was still in the works and I'm not sure which pharmacy he will be receiving the medication from. She verbalized understanding.   Nothing further needed at time of call.

## 2021-02-02 ENCOUNTER — Telehealth: Payer: Self-pay | Admitting: Internal Medicine

## 2021-02-02 ENCOUNTER — Telehealth (HOSPITAL_COMMUNITY): Payer: Self-pay | Admitting: Vascular Surgery

## 2021-02-02 DIAGNOSIS — G4733 Obstructive sleep apnea (adult) (pediatric): Secondary | ICD-10-CM | POA: Diagnosis not present

## 2021-02-02 DIAGNOSIS — I700 Atherosclerosis of aorta: Secondary | ICD-10-CM | POA: Diagnosis not present

## 2021-02-02 DIAGNOSIS — J84112 Idiopathic pulmonary fibrosis: Secondary | ICD-10-CM | POA: Diagnosis not present

## 2021-02-02 DIAGNOSIS — J849 Interstitial pulmonary disease, unspecified: Secondary | ICD-10-CM | POA: Diagnosis not present

## 2021-02-02 DIAGNOSIS — Z7901 Long term (current) use of anticoagulants: Secondary | ICD-10-CM | POA: Diagnosis not present

## 2021-02-02 DIAGNOSIS — Z7984 Long term (current) use of oral hypoglycemic drugs: Secondary | ICD-10-CM | POA: Diagnosis not present

## 2021-02-02 DIAGNOSIS — E1122 Type 2 diabetes mellitus with diabetic chronic kidney disease: Secondary | ICD-10-CM | POA: Diagnosis not present

## 2021-02-02 DIAGNOSIS — I509 Heart failure, unspecified: Secondary | ICD-10-CM | POA: Diagnosis not present

## 2021-02-02 DIAGNOSIS — J9611 Chronic respiratory failure with hypoxia: Secondary | ICD-10-CM | POA: Diagnosis not present

## 2021-02-02 DIAGNOSIS — I2510 Atherosclerotic heart disease of native coronary artery without angina pectoris: Secondary | ICD-10-CM | POA: Diagnosis not present

## 2021-02-02 DIAGNOSIS — N189 Chronic kidney disease, unspecified: Secondary | ICD-10-CM | POA: Diagnosis not present

## 2021-02-02 NOTE — Telephone Encounter (Signed)
Returned pt call /lmv to call back

## 2021-02-02 NOTE — Telephone Encounter (Signed)
I called and spoke with Radovan with [facility] and he stated that he did the eval on the pt and he stated that the pt has been doing PT.  They did review of some safety things like grab bars in the shower and an anti slip mat that he can use.  Will forward to MR to make him aware.

## 2021-02-03 DIAGNOSIS — I700 Atherosclerosis of aorta: Secondary | ICD-10-CM | POA: Diagnosis not present

## 2021-02-03 DIAGNOSIS — G4733 Obstructive sleep apnea (adult) (pediatric): Secondary | ICD-10-CM | POA: Diagnosis not present

## 2021-02-03 DIAGNOSIS — Z7901 Long term (current) use of anticoagulants: Secondary | ICD-10-CM | POA: Diagnosis not present

## 2021-02-03 DIAGNOSIS — Z7984 Long term (current) use of oral hypoglycemic drugs: Secondary | ICD-10-CM | POA: Diagnosis not present

## 2021-02-03 DIAGNOSIS — N189 Chronic kidney disease, unspecified: Secondary | ICD-10-CM | POA: Diagnosis not present

## 2021-02-03 DIAGNOSIS — J849 Interstitial pulmonary disease, unspecified: Secondary | ICD-10-CM | POA: Diagnosis not present

## 2021-02-03 DIAGNOSIS — J84112 Idiopathic pulmonary fibrosis: Secondary | ICD-10-CM | POA: Diagnosis not present

## 2021-02-03 DIAGNOSIS — E1122 Type 2 diabetes mellitus with diabetic chronic kidney disease: Secondary | ICD-10-CM | POA: Diagnosis not present

## 2021-02-03 DIAGNOSIS — J9611 Chronic respiratory failure with hypoxia: Secondary | ICD-10-CM | POA: Diagnosis not present

## 2021-02-03 NOTE — Telephone Encounter (Signed)
Thanks

## 2021-02-06 DIAGNOSIS — G4733 Obstructive sleep apnea (adult) (pediatric): Secondary | ICD-10-CM | POA: Diagnosis not present

## 2021-02-06 DIAGNOSIS — J849 Interstitial pulmonary disease, unspecified: Secondary | ICD-10-CM | POA: Diagnosis not present

## 2021-02-06 DIAGNOSIS — J9611 Chronic respiratory failure with hypoxia: Secondary | ICD-10-CM | POA: Diagnosis not present

## 2021-02-07 ENCOUNTER — Ambulatory Visit: Payer: Medicare HMO | Admitting: Primary Care

## 2021-02-07 NOTE — Progress Notes (Deleted)
_0  ID: Vincent Chandler, male    DOB: 1960/09/12, 60 y.o.   MRN: [id number]  No chief complaint on file.   Referring provider: Carollee Chandler, Vincent Chandler, *  HPI:  60 year old male, never smoked.  Past medical history significant for interstitial lung disease, obstructive sleep apnea, congestive heart failure, hypertension, and NSTEMI, A. Fib (hx amiodarone use), chronic periodontitis, type 2 diabetes, stage 3 chronic kidney disease, hyperlipemia. Patient of Vincent Chandler, last seen on 02/20/19. 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 June 2020 found to have new exertional dyspnea. Vincent Chandler did cath - PCWP 12 and lasix reduced (Creat 2 with GFR 50s now. Home sleep test showed severe sleep apnea in June 2020, started on CPAP auto titrate 5-15cm h20. Maintained on Dulera, albuterol as needed. Pulmonary fibrosis felt to have progressed despite stopping amiodarone and prednisone therapy. Plan reduce prednisone to 66m daily, started on OFEV 1075mtwice daily. Needs LFTS and BMET at follow-up  Previous [facility] encounter:  04/07/2019 Patient presents today for office visit to qualify for POC/Inogen. He has not received OFEV medication. Breathing is ok, baseline. He has difficulty with shortness of breath without oxygen. Associated weakness. No change in cough, mainly dry. States Vincent. McAundra Dubinncreased his prednisone back up to 1045maily. Right flank pain and rash started 3 days ago. He has an apt with PCP in 2 days. Living with friend, not currently at old address. Needs LFTs and BMET after starting OFEV.    01/27/2020  - Visit  [age]ar old male current smoker followed in our office for interstitial lung disease, obstructive sleep apnea as well as chronic respiratory failure.  Patient was last seen in our office in October/2020 by [name].  It was recommended at that time that he continue forward with starting antifibrotic's Ofev 100 mg twice daily, follow-up in 6 weeks with Vincent. RamChase CallerContinue oxygen  therapy, needing 3 L pulsed with exertion, and continue CPAP therapy.   Patient presenting to office today.  Patient refused to be walked in our office today due to ongoing foot and lower extremity wounds.  CPAP compliance report attempted to be obtained.  Per [facility] patient last used in September/2020.  Patient reporting today that he uses every other night.  We have contacted patient's DME company [employer] who reports the patient does not use his CPAP and many months.  Patient denies this.  Patient reporting that he has had acute worsening shortness of breath.  He reports that now he is on his oxygen 24/7.  He reports that this is acutely worsened.  He has placed himself on oxygen 24/7 due to acute worsening symptoms of dyspnea.  This has not been due to recorded oxygen levels below 88%.  He reports that occasionally when he is sitting in bed his oxygen levels are below 88%.  We will discuss and review this today.    Patient remains adherent to Ofev.  He never completed repeat lab work with Ofev start.  We will discuss and review this today.  Patient remains adherent to daily prednisone managed by cardiology.  Patient has not had repeat lung functioning since 2020.  May need to consider repeat  chest imaging.  Patient also needs follow-up with Vincent. RamChase CallerWe will discuss and coordinate today.  11/26/2020 - WVolanda Chandler Patient presents today for overdue follow-up. He was last seen July 2021 by pulmonary NP and was recommended to follow-up in 2 months. Breathing is worse over the last 3-4 month. Increased  fatigue and more weakness in legs. No significant cough. He has been consistently taking his OFEV, he only has 3 days left of medication. Needs OFEV renewed. He has not been on Dulera severals months ago, tells me he just stopped taking it. He is on prednisone 58m daily.  He is using 4L oxygen. He takes lasix 452mdaily. Weight has been stable. No diarrhea, appetite is ok. Overdue for spiro with  DLCO, 6MWT and CMET.    01/06/2021 -   Chief Complaint  Patient presents with   Follow-up    PFT performed today.  Pt states he feels like he has gotten worse since last visit. Has an occasional cough as well as increased SOB. Pt also has some complaints of chest discomfort.    ILD followup - suspected amio lung toxicity  On nintedanib since the end of 2020  HPI Vincent Chandler 60 y.o. -returns for follow-up.  Personally have not seen him in 2 years.  Towards end of 2020 nurse practitioner started him on nintedanib.  He is also on prednisone 10 mg/day.  He thinks is because of his lung disease.  He says he is progressively worse.  In between he is on nurse practitioners.  In the summer 2021 last year he had a CT scan of the chest his CT scan changed from probable UIP to definitive UIP with progression.  This is progressive phenotype.  Initial thought was this was amiodarone lung toxicity.  However he is behaving like IPF patient.  He is progressing despite nintedanib.  His symptom scores are much worse.  He had pulmonary function test shown below and is significantly worse.  He is on 3-4 L oxygen all the time.  He has lost a lot of weight according to his history.  He says he is also not that physically strong.  When he stands up it is difficult for him to walk steady.  He did demonstrate his ability to stand up and walk after walking 5 to 6 feet he did get a little bit wobbly.  He is now cachectic.  He lives alone with some of the elderly person.  He does not appear to have family.  His symptom score shows significant deterioration.  In terms of his heart failure echocardiogram last year still shows persistent cardiomyopathy with low EF.  I do not see a right heart catheterization on him.  He has seen Vincent. DaLoralie Champagneecently.  He is not aware of cardiac transplantation is in the works.     You are having progressive pulmonary fibrosis despite nintedanib You are having significant weight loss  which could be because of pulmonary fibrosis and heart failure but also nintedanib   Plan  - Check liver function test and chemistry panel today -when showed your liver, kidney and potassium are normal -Refer pharmacist in our office to switch you to pirfenidone             -This drug can also cause significant GI side effects were to having accelerated worsening of lung function and so were trying this is the next alternate standard of care option -You probably will not qualify for clinical trials and pulmonary fibrosis is a care option because of your heart failure -I have written to Vincent. DaLoralie Champagneo see if you need a right heart catheterization to see if you will qualify for inhaled Tyvaso             -However this drug can also be  contraindicated in the presence of heart failure             - This drug is to improve your symptoms and quality of life -Refer to [employer] [facility] lung transplantation program             -It is possible that on account of your heart failure you could be excluded from a heart lung transplant but I want to make a referral and assess -Continue nintedanib for now -Continue prednisone 10 mg/day for now             -We will try to reduce this in the future -Refer home physical therapy   Follow-up - 4 weeks with nurse practitioner -8-12 weeks with Vincent Chandler 30-minute visit      02/07/2021- Interim hx  Patient presents today for 1 month follow-up. Patient has progressive pulmonary fibrosis despite nintedanib. During his last visit with Vincent Chandler he was referred to Vincent. Aundra Chandler for right heart cath to assess for pulmonary HTN and possibly starting Tyvaso. He was also referred to [facility] for lung transplant program, however, heart failure may exclude patient. Ans last we was referred to physical therapy.   - Continue nintedanib for now -Continue prednisone 10 mg/day for now             -We will try to reduce this in the future     SYMPTOM SCALE - ILD  02/20/2019   11/26/2020 ofev 01/06/2021 ofev  O2 use ra + occ prn ex o2 3-4L  3-4:   Shortness of Breath 0 -> 5 scale with 5 being worst (score 6 If unable to do) 0 -> 5 scale with 5 being worst (score 6 If unable to do)   At rest 1 2.5 2  Simple tasks - showers, clothes change, eating, shaving 2 3.5 4  Household (dishes, doing bed, laundry) _0 Shopping _1 Walking keeping up with others of same age 61 3.5 4  Walking up Stairs _2 Total (40 - 48) Dyspnea Score 17 24.5 25  How bad is your cough? _3 How bad is your fatigue _4 nausea0   1  Vo,iot   1  diarrhea     anxiety   4  deoression   2      Significant testing reviewed: ECHO TEE 08/20/17 - EF 15%  (CPST 08/24/17 - high risk CHF features with Vo2 max 12 and also desaturation  < 90%)   HRCT IMPRESSION 09/17/17- The appearance of the lungs is compatible with interstitial lung disease. The pattern is considered a probable usual interstitial pneumonia (UIP) CT pattern, however, at this time, no definitive honeycombing is identified  HRCT IMPRESSION 12/27/18- I mages demonstrate widespread areas of ground-glass attenuation, septal thickening, subpleural reticulation, thickening of the peribronchovascular interstitium, mild cylindrical traction bronchiectasis and regional areas of architectural distortion. No frank honeycombing is confidently identified at this time. Findings have a definitive craniocaudal gradient and appear minimally progressive compared to the prior study. Findings in the lungs remain compatible with interstitial lung disease, with a pattern considered probable usual interstitial pneumonia (UIP) per current ATS guidelines.    No Known Allergies   No Known Allergies  Immunization History  Administered Date(s) Administered   Influenza Inj Mdck Quad Pf 06/14/2018   Influenza Split 06/05/2012   Influenza Whole 05/09/2010   Influenza,inj,Quad PF,6+ Mos 04/09/2013, 06/05/2014, 08/17/2017, 04/07/2019    Influenza-Unspecified 04/03/2015  Moderna Sars-Covid-2 Vaccination 07/29/2019, 09/02/2019   Pneumococcal Conjugate-13 06/05/2014   Pneumococcal Polysaccharide-23 02/17/2012   Td 05/09/2010, 02/07/2019    Past Medical History:  Diagnosis Date   AICD (automatic cardioverter/defibrillator) present    Anginal pain (HCC)    Anxiety    Asthma    Atrial fibrillation-postoperative 11/28/2012   Cardiomyopathy    alcohol use related   CHF (congestive heart failure) (HCC)    Chronic back pain    Coronary artery disease    drug eluting stent RCA 2005-EF 30%- s/p CABG x 4; 2/4 patent grafts with SVG-PLOM and SVG-RCA system totally occluded.  There are collaterals from the LAD system to the RCA and the RCA is totally occluded proximally.  There are no collaterals to the LCx territory.  He then underwent successful PCI of the mid left circumflex artery with overlapping Synergy drug-eluting stents   Depression    pt denies   Diabetes mellitus type II dx'd in the 1990's   GERD (gastroesophageal reflux disease)    yrs ago   H/O hiatal hernia    Hemorrhoids    History of hypogonadism    History of kidney stones    Hyperlipidemia    Hypertension    OSA on CPAP    "mask is broken; working on getting a new one" (01/15/2015; 10/03/2017)   Pneumonia    "3-4 times" (10/03/2017)   Urinary incontinence     Tobacco History: Social History   Tobacco Use  Smoking Status Never  Smokeless Tobacco Current   Types: Chew   Ready to quit: Not Answered Counseling given: Not Answered   Outpatient Medications Prior to Visit  Medication Sig Dispense Refill   ACCU-CHEK FASTCLIX LANCETS MISC Use as directed once a day.  Dx code: E11.9 100 each 1   albuterol (ACCUNEB) 0.63 MG/3ML nebulizer solution Take 3 mLs (0.63 mg total) by nebulization every 6 (six) hours as needed for wheezing. 75 mL 12   albuterol (VENTOLIN HFA) 108 (90 Base) MCG/ACT inhaler Inhale 2 puffs into the lungs every 6 (six) hours as needed  for wheezing or shortness of breath.      allopurinol (ZYLOPRIM) 300 MG tablet Take 1 tablet (300 mg total) by mouth daily. 30 tablet 6   apixaban (ELIQUIS) 5 MG TABS tablet TAKE 1 TABLET BY MOUTH 2 TIMES DAILY. 180 tablet 3   atorvastatin (LIPITOR) 40 MG tablet TAKE 1 TABLET BY MOUTH AT BEDTIME. 30 tablet 3   blood glucose meter kit and supplies Dispense based on patient and insurance preference. Use as directed once a day. Dx Code E11.9. 1 each 0   Blood Glucose Monitoring Suppl (ACCU-CHEK GUIDE) w/Device KIT 1 each by Does not apply route daily. DX Code: E11.9 1 kit 0   carvedilol (COREG) 3.125 MG tablet Take 1 tablet (3.125 mg total) by mouth 2 (two) times daily. 180 tablet 3   Continuous Blood Gluc Sensor (FREESTYLE LIBRE 2 SENSOR) MISC 1 Device by Does not apply route as directed. 2 each 11   digoxin (LANOXIN) 0.125 MG tablet TAKE 1/2 TABLET BY MOUTH DAILY. (Patient not taking: Reported on 01/06/2021) 45 tablet 3   doxycycline (VIBRA-TABS) 100 MG tablet Take 1 tablet (100 mg total) by mouth 2 (two) times daily. 20 tablet 0   fluticasone-salmeterol (ADVAIR HFA) 230-21 MCG/ACT inhaler Inhale 2 puffs into the lungs 2 (two) times daily. 1 each 12   furosemide (LASIX) 20 MG tablet Take 1 tablet (20 mg total) by mouth daily. [place]  tablet 3   glucose blood (ACCU-CHEK GUIDE) test strip Use as directed once a day.  Dx code: E11.9 100 each 1   guaiFENesin (MUCINEX) 600 MG 12 hr tablet Take 1 tablet (600 mg total) by mouth 2 (two) times daily as needed for to loosen phlegm.     icosapent Ethyl (VASCEPA) 1 g capsule Take 2 capsules (2 g total) by mouth 2 (two) times daily. 120 capsule 6   metFORMIN (GLUCOPHAGE) 500 MG tablet Take 1 tablet (500 mg total) by mouth 2 (two) times daily with a meal. 180 tablet 1   Nintedanib (OFEV) 150 MG CAPS Take 1 capsule (150 mg total) by mouth in the morning and at bedtime. 180 capsule 1   nitroGLYCERIN (NITROSTAT) 0.4 MG SL tablet Place 1 tablet (0.4 mg total) under the  tongue every 5 (five) minutes as needed for chest pain. (Patient not taking: Reported on 01/06/2021) 25 tablet 3   OXYGEN Inhale 3 L into the lungs daily as needed (low oxygen).     predniSONE (DELTASONE) 10 MG tablet TAKE 1 TABLET (10 MG TOTAL) BY MOUTH DAILY WITH BREAKFAST. 30 tablet 3   sacubitril-valsartan (ENTRESTO) 24-26 MG TAKE 1 TABLET BY MOUTH 2 (TWO) TIMES DAILY. 60 tablet 11   No facility-administered medications prior to visit.      Review of Systems  Review of Systems   Physical Exam  There were no vitals taken for this visit. Physical Exam   Lab Results:  CBC    Component Value Date/Time   WBC 6.7 12/30/2020 1335   RBC 3.02 (L) 12/30/2020 1335   HGB 11.2 (L) 12/30/2020 1335   HGB 14.3 08/21/2018 1500   HCT 35.0 (L) 12/30/2020 1335   HCT 41.0 08/21/2018 1500   PLT 315.0 12/30/2020 1335   PLT 204 08/21/2018 1500   MCV 115.9 Repeated and verified X2. (H) 12/30/2020 1335   MCV 110 (H) 08/21/2018 1500   MCH 37.3 (H) 07/21/2020 1218   MCHC 32.1 12/30/2020 1335   RDW 16.0 (H) 12/30/2020 1335   RDW 13.8 08/21/2018 1500   LYMPHSABS 1.7 12/30/2020 1335   LYMPHSABS 1.4 07/10/2018 1700   MONOABS 0.6 12/30/2020 1335   EOSABS 0.3 12/30/2020 1335   EOSABS 0.2 07/10/2018 1700   BASOSABS 0.0 12/30/2020 1335   BASOSABS 0.0 07/10/2018 1700    BMET    Component Value Date/Time   NA 141 01/06/2021 1418   NA 138 07/10/2018 1700   K 4.7 01/06/2021 1418   K 4.7 01/06/2021 1418   CL 103 01/06/2021 1418   CO2 27 01/06/2021 1418   GLUCOSE 100 (H) 01/06/2021 1418   BUN 50 (H) 01/06/2021 1418   BUN 30 (H) 07/10/2018 1700   CREATININE 1.58 (H) 01/06/2021 1418   CREATININE 2.12 (H) 11/26/2020 1648   CALCIUM 9.6 01/06/2021 1418   GFRNONAA 52 (L) 12/27/2020 1232   GFRAA 36 (L) 01/26/2020 1028    BNP    Component Value Date/Time   BNP 768.9 (H) 12/14/2020 1255   BNP 78.8 07/14/2015 1547    ProBNP    Component Value Date/Time   PROBNP 731 (H) 07/12/2016 1603    PROBNP 165.0 (H) 06/16/2013 1111    Imaging: No results found.   Assessment & Plan:   No problem-specific Assessment & Plan notes found for this encounter.     Martyn Ehrich, NP 02/07/2021

## 2021-02-08 ENCOUNTER — Other Ambulatory Visit (HOSPITAL_COMMUNITY): Payer: Self-pay

## 2021-02-09 ENCOUNTER — Other Ambulatory Visit (HOSPITAL_COMMUNITY): Payer: Self-pay

## 2021-02-09 ENCOUNTER — Telehealth (HOSPITAL_COMMUNITY): Payer: Self-pay | Admitting: *Deleted

## 2021-02-09 DIAGNOSIS — I5022 Chronic systolic (congestive) heart failure: Secondary | ICD-10-CM

## 2021-02-09 NOTE — Telephone Encounter (Signed)
Called [employer] to check on patient's application. Rep Mendel Ryder stated she will process application today and it appears he will be approved. Awaiting approval letter form program.  Phone# [phone]

## 2021-02-09 NOTE — Telephone Encounter (Signed)
Get CMET, BNP,  and CBC before his appt. I am afraid he is developing end-stage heart and lung disease.

## 2021-02-09 NOTE — Telephone Encounter (Signed)
Pts sister called stating pt is losing weight/muscle mass. Pt has no appetite. She said pt has bruising from being on blood thinners. Pts sister concerned medications may need to be adjusted. Sister emailed pictures of bruising (see below). Pt has upcoming appointment. Sister would like this addressed during visit. Also asked if Dr.McLean would take a look at pulmonary  office visit note from  01/06/21.   Routed to [facility] [place]

## 2021-02-10 NOTE — Addendum Note (Signed)
Addended byShela Nevin R on: 2/86/3817 04:22 PM   Modules accepted: Orders

## 2021-02-10 NOTE — Telephone Encounter (Signed)
Received a fax from  [place] regarding an approval for [facility] patient assistance from 02/09/21 until further notice.   Phone number: [phone]  Medication will ship from [place]- Phone# [phone]

## 2021-02-10 NOTE — Telephone Encounter (Signed)
Patient advised and verbalized understanding. Lab appt scheduled, lab order entered.

## 2021-02-11 NOTE — Telephone Encounter (Signed)
ATC patient to provide Esbriet approval notification. Unable to reach but left VM with direct office number requesting return call  Knox Saliva, PharmD, MPH, BCPS Clinical Pharmacist (Rheumatology and Pulmonology)

## 2021-02-14 NOTE — Telephone Encounter (Signed)
ATC patient to review Esbriet approval. Tried two home number on file. Phone went straight to VM. Left VM on both numbers requesting return call to review. Provided direct office number. Will f/u with patient  Knox Saliva, PharmD, MPH, BCPS Clinical Pharmacist (Rheumatology and Pulmonology)

## 2021-02-15 ENCOUNTER — Other Ambulatory Visit: Payer: Self-pay

## 2021-02-15 ENCOUNTER — Other Ambulatory Visit (HOSPITAL_COMMUNITY): Payer: Self-pay

## 2021-02-15 ENCOUNTER — Ambulatory Visit (HOSPITAL_COMMUNITY)
Admission: RE | Admit: 2021-02-15 | Discharge: 2021-02-15 | Disposition: A | Discharge status: Home / Self Care | Payer: Medicare HMO | Source: Ambulatory Visit | Attending: Cardiology | Admitting: Cardiology

## 2021-02-15 DIAGNOSIS — I5022 Chronic systolic (congestive) heart failure: Secondary | ICD-10-CM

## 2021-02-15 LAB — COMPREHENSIVE METABOLIC PANEL
ALT: 16 U/L (ref 0–44)
AST: 26 U/L (ref 15–41)
Albumin: 3.4 g/dL — ABNORMAL LOW (ref 3.5–5.0)
Alkaline Phosphatase: 70 U/L (ref 38–126)
Anion gap: 11 (ref 5–15)
BUN: 50 mg/dL — ABNORMAL HIGH (ref 6–20)
CO2: 25 mmol/L (ref 22–32)
Calcium: 9.5 mg/dL (ref 8.9–10.3)
Chloride: 102 mmol/L (ref 98–111)
Creatinine, Ser: 1.68 mg/dL — ABNORMAL HIGH (ref 0.61–1.24)
GFR, Estimated: 47 mL/min — ABNORMAL LOW (ref >60)
Glucose, Bld: 92 mg/dL (ref 70–99)
Potassium: 4.9 mmol/L (ref 3.5–5.1)
Sodium: 138 mmol/L (ref 135–145)
Total Bilirubin: 0.6 mg/dL (ref 0.3–1.2)
Total Protein: 6.6 g/dL (ref 6.5–8.1)

## 2021-02-15 LAB — CBC
HCT: 36.4 % — ABNORMAL LOW (ref 39.0–52.0)
Hemoglobin: 11.4 g/dL — ABNORMAL LOW (ref 13.0–17.0)
MCH: 38.9 pg — ABNORMAL HIGH (ref 26.0–34.0)
MCHC: 31.3 g/dL (ref 30.0–36.0)
MCV: 124.2 fL — ABNORMAL HIGH (ref 80.0–100.0)
Platelets: 177 10*3/uL (ref 150–400)
RBC: 2.93 MIL/uL — ABNORMAL LOW (ref 4.22–5.81)
RDW: 20 % — ABNORMAL HIGH (ref 11.5–15.5)
WBC: 6.1 10*3/uL (ref 4.0–10.5)
nRBC: 1 % — ABNORMAL HIGH (ref 0.0–0.2)

## 2021-02-15 LAB — BRAIN NATRIURETIC PEPTIDE: B Natriuretic Peptide: 923.6 pg/mL — ABNORMAL HIGH (ref 0.0–100.0)

## 2021-02-21 ENCOUNTER — Telehealth (HOSPITAL_COMMUNITY): Payer: Self-pay

## 2021-02-21 NOTE — Telephone Encounter (Signed)
Returned call to patient regarding Esbriet approval. He states that he has not yet heard from [place] regarding approval. I provided phone number for [facility]. I've advised him to reach out to [facility] first to complete onboarding since they will triage prescription to [facility] only once onboarding is complete.  We reviewed up-titration dosing and sunscreen recommendation. Advised him to reach out if he has any concerns or side effects once starting so that we can help manage.  He verbalized understanding  Knox Saliva, PharmD, MPH, BCPS Clinical Pharmacist (Rheumatology and Pulmonology)

## 2021-02-21 NOTE — Telephone Encounter (Signed)
Called to confirm/remind patient of their appointment at the [facility] on 02/22/21 at 12:00 noon.   Patient reminded to bring all medications and/or complete list.  Confirmed patient has transportation. Gave directions, instructed to utilize [place] parking.  Confirmed appointment prior to ending call.

## 2021-02-21 NOTE — Progress Notes (Addendum)
Advanced Heart Failure Clinic Note  Date:  02/22/2021   PCP:  Ann Held, DO  HF Cardiologist: Dr. [name]   History of Present Illness: JADIE COMAS is a 60 y.o. male who has a history of A flutter, chronic systolic heart failure, CAD s/p CABG, CKD Stage III, DMII, OSA .   Admitted 08/07/17 with atrial flutter/RVR and acute on chronic systolic heart failure. Underwent successful DC/CV on 2/8. Required short term milrinone but was able to wean off. HF meds started. Echo this admission with EF 15%. D/C weight 201 pounds.   CPX in 2/19 showed severe functional impairment due to heart failure. However, PFTs were restrictive and high resolution CT chest was concerning for interstitial lung disease.    He has had atrial flutter ablation.    He had RHC in 4/19 showing preserved cardiac output and low filling pressures, but moderate pulmonary hypertension.    He saw pulmonary regarding interstitial lung disease and had bronchoscopy.  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  He has been started on prednisone, cough is much improved on prednisone.     He saw Dr. Caryl Comes, not planning to upgrade ICD to CRT given IVCD but not LBBB-like.   He had a Barostim device placed in 1/20.    I tried him on [place] but he developed orthostasis after starting it and had to stop.   He has been on prednisone for suspected amiodarone pulmonary toxicity/pulmonary fibrosis.  He has had a chronic cough.   With worsened dyspnea, I took him for RHC in 6/20.  This showed normal filling pressures and low but not markedly low cardiac output. High resolution CT chest in 6/20 showed interstitial lung disease in UIP patttern. He saw pulmonary and prednisone was increased then tapered back down.   Echo in 3/21 showed EF 20-25%, mild RV dilation with moderately decreased systolic function.   LHC/RHC in 1/22 showed low filling pressures, preserved cardiac output, moderate PAH; occluded  SVG-OM and SVG-RCA with patent LIMA-LAD and SVG-D, occluded proximal RCA and mid LCx (LCx occlusion was new).  No interventional target.  Based on cath, I suspected that dyspnea and fatigue was related mostly to his pulmonary fibrosis. Dr. [name] tried him on Tyvaso for PH-ILD, but he did not tolerate it (made his heart race).    Today he returns for HF follow up. He is SOB with walking short distances on flat ground, continues with feeling weak and poor energy. On 3L oxygen continuously and is wearing during physical activity and PT. Continues to have dizziness with position changes and bending over.  Denies CP, edema; chronically sleeps reclined.  Does not weigh at home. He says he is not watching his fluid or salt intake. Recently returned from vacation in [place]. He has been off of his digoxin and atorvastatin for unclear reasons.  MDT ICD Interrogation (personally reviewed): Fluid index elevated, crossed threshold 7/22. ? Bursts of AF around this time, longest episode lasting 20 hrs, appears he had a VT episode 01/12/21. Will request formal device interrogation.  Labs (2/19): free T4 increased but free T3 normal, LDL 86, HDL 30 Labs (3/19): K 4.9, creatinine 1.53, hgb 14.3, LDL 52, ANA negative, RF negative Labs (4/19): K 4.2, creatinine 1.27 Labs (5/19): LDL 69, K 4.4, creatinine 1.36 Labs (8/19): K 5.3, creatinine 2.2 Labs (10/19): K 5, creatinine 1.49 Labs (12/19): hgb 13.4 Labs (2/20): K 4.2, creatinine 1.45, hgb 14.3 Labs (5/20):  K 4.2, creatinine 1.81, digoxin 0.6, LDL 16, TGs 396, hgb 13.8 Labs (6/20): creatinine 2 Labs (8/20): LDL 27, Tgs 284, LFTs normal, K 4.5, creatinine 2.3.  Labs (10/20): K 4.4, creatinine 1.79, hgb 13.4 Labs (12/20): LDL 68, TGs 255 Labs (3/21): K 4.3, creatinine 2.19 Labs (6/21): K 4.4, creatinine 1.87 Labs (7/21): K 4.5, creatinine 2.2, digoxin 0.7 Labs (12/21): LDL 54, HDL 56, TGs 178 Labs (1/22): K 4.7 => 5.1, creatinine 1.77 => 2.05 Labs (5/22): K  5.3, creatinine 2.12 Labs (8/22): K 4.9, creatinine 1.68, hgb 11.4   PMH:  1. Atrial fibrillation: Noted post-op CABG.  2. Atrial flutter: Noted during 2/19 admission, DCCV done.  S/p Ablation 3/19.  3. CAD: s/p CABG.  - LHC (1/16):  Patent LIMA-LAD and SVG-D. 80% mLCx and 99% PLOM, total occlusion of SVG-PLOM.  Total occlusion of RCA with occlusion of SVG-RCA.  Patient had DES to mLCx-PLOM.  - LHC (1/22): Occluded SVG-OM and SVG-RCA with patent LIMA-LAD and SVG-D, occluded proximal RCA, occluded mid LCX.  The LCx occlusion was new. No interventional target. 4. HTN 5. Type II diabetes  6. Hyperlipidemia 7. Chronic systolic CHF: Ischemic cardiomyopathy.   - Echo (2/19): EF 15%, mildly dilated LV, mild LVH, inferolateral akinesis, milr MR, mildly dilated RV with severely reduced systolic function.  - CPX (2/19): Peak VO2 12.2, VE/VCO2 slope 53, RER 1.07 => severe limitation from heart failure.  - Medtronic ICD  - RHC (4/19): mean RA 5, PA 54/18 mean 31, mean PCWP 12, CI 2.5, PVR 3.6 WU - Echo (1/20): EF 20% - Barostimulator device placed in 1/20.  - RHC (6/20): mean RA 6, PA 42/17 mean 27, mean PCWP 12, CI 2.29, PVR 3.1  - Echo (3/21): EF 20-25%, mild RV dilation with moderately decreased systolic function. - RHC (1/22): RA mean 1, RV 48/1, PA 46/12 (26), PCWP mean 7, CO 4.46, CI 2.34, PVR 4.2 WU. 8. Interstitial lung disease: PFTs (2/19) were restrictive, raising concern for ILD.  - High resolution CT chest in 2/19: interstitial lung disease concerning for usual interstitial pneumonitis. - Bronchoscopy was done, possible amiodarone lung toxicity causing ILD, now on prednisone.  - High resolution chest CT (6/20): ILD in UIP pattern.   - Ofev 9. CKD stage 3 10. OSA: Home sleep study in 2020 with severe OSA.  11. ABIs (2/19): not significantly abnormal - ABIs (8/21): Normal 12. Carotid dopplers (2/19): Mild disease only.  - Carotid dopplers (1/20): Minimal stenosis.  13. Pulmonary  hypertension: ?Group 3 due to ILD.  14. Nephrolithiasis 15. Gout  Current Outpatient Medications  Medication Sig Dispense Refill   ACCU-CHEK FASTCLIX LANCETS MISC Use as directed once a day.  Dx code: E11.9 100 each 1   albuterol (ACCUNEB) 0.63 MG/3ML nebulizer solution Take 3 mLs (0.63 mg total) by nebulization every 6 (six) hours as needed for wheezing. 75 mL 12   albuterol (VENTOLIN HFA) 108 (90 Base) MCG/ACT inhaler Inhale 2 puffs into the lungs every 6 (six) hours as needed for wheezing or shortness of breath.      allopurinol (ZYLOPRIM) 300 MG tablet Take 1 tablet (300 mg total) by mouth daily. 30 tablet 6   apixaban (ELIQUIS) 5 MG TABS tablet TAKE 1 TABLET BY MOUTH 2 TIMES DAILY. 180 tablet 3   blood glucose meter kit and supplies Dispense based on patient and insurance preference. Use as directed once a day. Dx Code E11.9. 1 each 0   Blood Glucose Monitoring Suppl (ACCU-CHEK GUIDE) w/Device  KIT 1 each by Does not apply route daily. DX Code: E11.9 1 kit 0   carvedilol (COREG) 3.125 MG tablet Take 1 tablet (3.125 mg total) by mouth 2 (two) times daily. 180 tablet 3   doxycycline (VIBRA-TABS) 100 MG tablet Take 1 tablet (100 mg total) by mouth 2 (two) times daily. 20 tablet 0   fluticasone-salmeterol (ADVAIR HFA) 230-21 MCG/ACT inhaler Inhale 2 puffs into the lungs 2 (two) times daily. 1 each 12   furosemide (LASIX) 20 MG tablet Take 1 tablet (20 mg total) by mouth daily. 90 tablet 3   glucose blood (ACCU-CHEK GUIDE) test strip Use as directed once a day.  Dx code: E11.9 100 each 1   guaiFENesin (MUCINEX) 600 MG 12 hr tablet Take 1 tablet (600 mg total) by mouth 2 (two) times daily as needed for to loosen phlegm.     metFORMIN (GLUCOPHAGE) 500 MG tablet Take 1 tablet (500 mg total) by mouth 2 (two) times daily with a meal. 180 tablet 1   nitroGLYCERIN (NITROSTAT) 0.4 MG SL tablet Place 1 tablet (0.4 mg total) under the tongue every 5 (five) minutes as needed for chest pain. 25 tablet 3    OXYGEN Inhale 3 L into the lungs daily as needed (low oxygen).     predniSONE (DELTASONE) 10 MG tablet TAKE 1 TABLET (10 MG TOTAL) BY MOUTH DAILY WITH BREAKFAST. 30 tablet 3   sacubitril-valsartan (ENTRESTO) 24-26 MG TAKE 1 TABLET BY MOUTH 2 (TWO) TIMES DAILY. 60 tablet 11   Continuous Blood Gluc Sensor (FREESTYLE LIBRE 2 SENSOR) MISC 1 Device by Does not apply route as directed. (Patient not taking: Reported on 02/22/2021) 2 each 11   No current facility-administered medications for this encounter.    Allergies:   Patient has no known allergies.   Social History:  The patient  reports that he has never smoked. His smokeless tobacco use includes chew. He reports current alcohol use of about 3.0 standard drinks per week. He reports that he does not use drugs.   Family History:  The patient's family history includes Diabetes in his father and mother; Hyperlipidemia in his father; Hypertension in his father and mother.   ROS:  Please see the history of present illness.   All other systems are personally reviewed and negative.   Exam:    BP 110/78   Pulse 73   Wt 74.5 kg (164 lb 3.2 oz)   SpO2 100%   BMI 23.56 kg/m   General:  NAD. Chronically-ill appearing, arrived in [facility] on oxygen HEENT: Normal Neck: Supple. JVP to jaw. Carotids 2+ bilat; no bruits. No lymphadenopathy or thryomegaly appreciated. Cor: PMI nondisplaced. Regular rate & rhythm. No rubs, gallops or murmurs. Lungs: Clear Abdomen: Soft, nontender, nondistended. No hepatosplenomegaly. No bruits or masses. Good bowel sounds. Extremities: No cyanosis, clubbing, rash, 2-3 + BLE edema to knee Neuro: Alert & oriented x 3, cranial nerves grossly intact. Moves all 4 extremities w/o difficulty. Affect pleasant.  Recent Labs: 12/23/2020: TSH 6.28 02/15/2021: ALT 16; B Natriuretic Peptide 923.6; BUN 50; Creatinine, Ser 1.68; Hemoglobin 11.4; Platelets 177; Potassium 4.9; Sodium 138  Personally reviewed   Wt Readings from Last 3  Encounters:  02/22/21 74.5 kg (164 lb 3.2 oz)  01/06/21 69.9 kg (154 lb)  12/30/20 69.4 kg (153 lb)   ASSESSMENT AND PLAN:  1. Acute on chronic systolic CHF: Ischemic cardiomyopathy. Medtronic ICD single chamber. 2/19 admission for decompensated HF requiring milrinone. Echo 2/7/019 EF 15% Mildly dilated RV.  CPX in 2/19 was suggestive of severe limitation due to HF.   RHC in 4/19 showed normal filling pressures and preserved cardiac output.  Echo in 1/20 with EF 20%.  He had a Barostimulator device placed in 1/20.  RHC in 6/20 showed normal filling pressures, CI 2.29.  Echo 3/21 with EF 20-25%.  RHC (1/22) with RA mean 1, RV 48/1, PA 46/12 (26), PCWP mean 7, CO 4.46, CI 2.34, PVR 4.2 WU. He is volume overloaded by exam and Optivol today, weight up 8 lbs. NYHA IIIb symptoms, functional class complicated by pulmonary fibrosis.  - Increase lasix to 40 mg daily. (Previously on 20 mg daily). BMET & BNP today, repeat BMET in 10 days. - Restart spiro 12.5 mg daily. BMET in 10 days. - Restart digoxin 0.0625 mg daily.  Check digoxin level in 10 days.  - Continue carvedilol to 3.125 mg bid. - Continue Entresto 24/26 bid.   - Has IVCD, not LBBB-like.  Saw Dr. Caryl Comes, will not upgrade ICD to CRT.  - With significant ILD, I do not think that he would be an LVAD candidate.   2. VT: Formal device interrogation shows he had an episode of VT on 01/13/21 with shock. He does not remember this. He denies chest pain or palpitations. Recent SCr 1.68 so no cath. Dr. [name] aware. - Will need to get him in with EP this week.  - Labs + magnesium today. - No driving for 6 months. 3. Afib/ Atrial flutter: Paroxysmal.  Admitted in 2/19 with severe HF decompensation in setting of atrial flutter with RVR.  He was cardioverted.  He then had atrial flutter ablation in 3/19. He is off amiodarone due to question of amiodarone lung toxicity. No palpitations.  He has had recent AF burst on formal device interrogation lasting longer  than 6 hours for 4 days. He denies palpitations or worsening of dyspnea. - He is now off amiodarone (question of lung toxicity, would not re-challenge in future).   - Continue Eliquis 5 mg bid. He has been complaint with this. Denies bleeding other than easily bruising.  4. CAD: S/p CABG.  Last intervention was PCI to LCx in 2016. Repeat cath (1/22) with occluded SVG-OM and SVG-RCA with patent LIMA-LAD and SVG-D, occluded proximal RCA, and newly occluded mid LCX. No targets for revascularization. - Restart atorvastatin. Will refill Rx. - Triglycerides improved on Vascepa 2 g bid.  - No ASA with Eliquis use.  5. Pulmonary fibrosis: PFTs in 2/19 were restrictive, and high resolution CT showed interstitial lung disease. Amiodarone was stopped due to concern for possible toxicity.  He has seen [name] => bronchoscopy suggestive of ILD related to amiodarone.  He has been on chronic prednisone.  High resolution CT chest repeated in 6/20, again showing ILD, described at UIP pattern. He has started Ofev. I think that a significant amount of his symptomatology is due to pulmonary fibrosis. He is requesting a 2nd opinion. Discussed with Dr. [name] and offered referrals to [facility] or [facility]. Patient would like referral to [facility], I will arrange this. - He has been compliant with wearing oxygen with exertion.  - Continue Ofev.    - Continue prednisone 10 mg daily.  - Continue pulmonary followup.   6. OSA: Continue CPAP.    7. CKD: Stage 3. Last SCr 1.68 - Followup with nephrology.  8. Pulmonary hypertension: Suspect group 3 PH due to lung disease (ILD). RHC (1/22) with moderate pulmonary arterial hypertension, PVR 4.2 WU. - He  did not tolerate Tyvaso for PH-ILD. 9. DM2: He did not tolerate [place].   Patient had to leave clinic for another appointment before labs, repeat EKG and formal device interrogation completed. Will have him see EP this week and will need to re-enroll him in device clinic.  Followup in  2 weeks with APP to reassess volume. Refilled atorvastatin, digoxin, lasix and spiro. I have asked him to weigh daily.   [place] FNP 02/22/21

## 2021-02-22 ENCOUNTER — Other Ambulatory Visit: Payer: Self-pay

## 2021-02-22 ENCOUNTER — Telehealth (HOSPITAL_COMMUNITY): Payer: Self-pay | Admitting: Family Medicine

## 2021-02-22 ENCOUNTER — Other Ambulatory Visit (HOSPITAL_COMMUNITY): Payer: Self-pay | Admitting: Family Medicine

## 2021-02-22 ENCOUNTER — Other Ambulatory Visit (HOSPITAL_COMMUNITY): Payer: Self-pay | Admitting: Cardiology

## 2021-02-22 ENCOUNTER — Ambulatory Visit (HOSPITAL_COMMUNITY)
Admission: RE | Admit: 2021-02-22 | Discharge: 2021-02-22 | Disposition: A | Discharge status: Home / Self Care | Payer: Medicare HMO | Source: Ambulatory Visit | Attending: Family Medicine | Admitting: Family Medicine

## 2021-02-22 ENCOUNTER — Other Ambulatory Visit (HOSPITAL_COMMUNITY): Payer: Self-pay

## 2021-02-22 ENCOUNTER — Encounter (HOSPITAL_COMMUNITY): Payer: Self-pay

## 2021-02-22 VITALS — BP 110/78 | HR 73 | Wt 164.2 lb

## 2021-02-22 DIAGNOSIS — G4733 Obstructive sleep apnea (adult) (pediatric): Secondary | ICD-10-CM

## 2021-02-22 DIAGNOSIS — Z9981 Dependence on supplemental oxygen: Secondary | ICD-10-CM | POA: Insufficient documentation

## 2021-02-22 DIAGNOSIS — I472 Ventricular tachycardia, unspecified: Secondary | ICD-10-CM

## 2021-02-22 DIAGNOSIS — I5023 Acute on chronic systolic (congestive) heart failure: Secondary | ICD-10-CM | POA: Insufficient documentation

## 2021-02-22 DIAGNOSIS — N1831 Chronic kidney disease, stage 3a: Secondary | ICD-10-CM | POA: Diagnosis not present

## 2021-02-22 DIAGNOSIS — I4892 Unspecified atrial flutter: Secondary | ICD-10-CM | POA: Insufficient documentation

## 2021-02-22 DIAGNOSIS — Z9989 Dependence on other enabling machines and devices: Secondary | ICD-10-CM

## 2021-02-22 DIAGNOSIS — N183 Chronic kidney disease, stage 3 unspecified: Secondary | ICD-10-CM | POA: Insufficient documentation

## 2021-02-22 DIAGNOSIS — Z8349 Family history of other endocrine, nutritional and metabolic diseases: Secondary | ICD-10-CM | POA: Insufficient documentation

## 2021-02-22 DIAGNOSIS — E785 Hyperlipidemia, unspecified: Secondary | ICD-10-CM | POA: Insufficient documentation

## 2021-02-22 DIAGNOSIS — Z951 Presence of aortocoronary bypass graft: Secondary | ICD-10-CM | POA: Insufficient documentation

## 2021-02-22 DIAGNOSIS — I5022 Chronic systolic (congestive) heart failure: Secondary | ICD-10-CM

## 2021-02-22 DIAGNOSIS — Z7952 Long term (current) use of systemic steroids: Secondary | ICD-10-CM | POA: Insufficient documentation

## 2021-02-22 DIAGNOSIS — Z8249 Family history of ischemic heart disease and other diseases of the circulatory system: Secondary | ICD-10-CM | POA: Insufficient documentation

## 2021-02-22 DIAGNOSIS — Z7901 Long term (current) use of anticoagulants: Secondary | ICD-10-CM | POA: Insufficient documentation

## 2021-02-22 DIAGNOSIS — I255 Ischemic cardiomyopathy: Secondary | ICD-10-CM | POA: Diagnosis not present

## 2021-02-22 DIAGNOSIS — Z79899 Other long term (current) drug therapy: Secondary | ICD-10-CM | POA: Insufficient documentation

## 2021-02-22 DIAGNOSIS — E1122 Type 2 diabetes mellitus with diabetic chronic kidney disease: Secondary | ICD-10-CM

## 2021-02-22 DIAGNOSIS — I272 Pulmonary hypertension, unspecified: Secondary | ICD-10-CM | POA: Diagnosis not present

## 2021-02-22 DIAGNOSIS — I2510 Atherosclerotic heart disease of native coronary artery without angina pectoris: Secondary | ICD-10-CM | POA: Diagnosis not present

## 2021-02-22 DIAGNOSIS — J8410 Pulmonary fibrosis, unspecified: Secondary | ICD-10-CM | POA: Insufficient documentation

## 2021-02-22 DIAGNOSIS — I130 Hypertensive heart and chronic kidney disease with heart failure and stage 1 through stage 4 chronic kidney disease, or unspecified chronic kidney disease: Secondary | ICD-10-CM | POA: Diagnosis not present

## 2021-02-22 DIAGNOSIS — I480 Paroxysmal atrial fibrillation: Secondary | ICD-10-CM | POA: Insufficient documentation

## 2021-02-22 DIAGNOSIS — Z7984 Long term (current) use of oral hypoglycemic drugs: Secondary | ICD-10-CM | POA: Insufficient documentation

## 2021-02-22 DIAGNOSIS — Z7951 Long term (current) use of inhaled steroids: Secondary | ICD-10-CM | POA: Insufficient documentation

## 2021-02-22 DIAGNOSIS — I2721 Secondary pulmonary arterial hypertension: Secondary | ICD-10-CM | POA: Diagnosis not present

## 2021-02-22 MED ORDER — ATORVASTATIN CALCIUM 40 MG PO TABS: 40.0000 mg | ORAL_TABLET | Freq: Every day | ORAL | 3 refills | Status: AC

## 2021-02-22 MED ORDER — DIGOXIN 125 MCG PO TABS: 0.0625 mg | ORAL_TABLET | Freq: Every day | ORAL | 3 refills | Status: DC

## 2021-02-22 MED ORDER — FUROSEMIDE 20 MG PO TABS: 40.0000 mg | ORAL_TABLET | Freq: Every day | ORAL | 3 refills | Status: DC

## 2021-02-22 MED ORDER — SPIRONOLACTONE 25 MG PO TABS: 12.5000 mg | ORAL_TABLET | Freq: Every day | ORAL | 3 refills | Status: AC

## 2021-02-22 MED FILL — Sacubitril-Valsartan Tab 24-26 MG: ORAL | 30 days supply | Qty: 60 | Fill #4 | Status: AC

## 2021-02-22 NOTE — Telephone Encounter (Signed)
Patient had to leave his appt today before his formal device interrogation was completed. Called and left message. Appears he had VT in July. He will need to refrain from driving for 6 months. Will set him up with EP appointment this week, and re-enroll them in device clinic. He will need repeat labs BMET, CBC and Mag drawn. I will place these orders.  Allena Katz, FNP-BC

## 2021-02-22 NOTE — Telephone Encounter (Signed)
Spoke with patient notifying him of his EP appt on Thursday at 8 am. We will arrange for [employer]. He understands he is not to drive for 6 months.

## 2021-02-22 NOTE — Patient Instructions (Addendum)
RESTART Spironolactone 12.5 mg (one half tab) daily RESTART Digoxin 0.0625 mg (one half tab) daily RESTART Lasix 40 mg daily RESTART Atorvastatin  40 mg, one tab daily at bedtime    Labs needed in 7-14 days  Your physician recommends that you schedule a follow-up appointment in: 2-3 weeks  in the Advanced Practitioners (PA/NP) Clinic and in 6-8 weeks with Dr [name]  Do the following things EVERYDAY: Weigh yourself in the morning before breakfast. Write it down and keep it in a log. Take your medicines as prescribed Eat low salt foods--Limit salt (sodium) to 2000 mg per day.  Stay as active as you can everyday Limit all fluids for the day to less than 2 liters  At the [facility], you and your health needs are our priority. As part of our continuing mission to provide you with exceptional heart care, we have created designated Provider Care Teams. These Care Teams include your primary Cardiologist (physician) and Advanced Practice Providers (APPs- Physician Assistants and Nurse Practitioners) who all work together to provide you with the care you need, when you need it.   You may see any of the following providers on your designated Care Team at your next follow up: Dr Glori Bickers Dr Loralie Champagne Dr Patrice Paradise, NP Lyda Jester, [place] Ginnie Smart Audry Riles, PharmD   Please be sure to bring in all your medications bottles to every appointment.

## 2021-02-22 NOTE — Telephone Encounter (Signed)
Should patient still be taking prednisone? Please advise

## 2021-02-23 ENCOUNTER — Ambulatory Visit: Payer: Medicare HMO | Admitting: Podiatry

## 2021-02-23 ENCOUNTER — Telehealth: Payer: Self-pay

## 2021-02-23 ENCOUNTER — Other Ambulatory Visit (HOSPITAL_COMMUNITY): Payer: Self-pay

## 2021-02-23 DIAGNOSIS — E1165 Type 2 diabetes mellitus with hyperglycemia: Secondary | ICD-10-CM | POA: Diagnosis not present

## 2021-02-23 DIAGNOSIS — S90821A Blister (nonthermal), right foot, initial encounter: Secondary | ICD-10-CM | POA: Diagnosis not present

## 2021-02-23 MED ORDER — PREDNISONE 10 MG PO TABS
10.0000 mg | ORAL_TABLET | Freq: Every day | ORAL | 3 refills | Status: AC
  Filled 2021-02-23: qty 30, 30d supply, fill #0
  Filled 2021-03-28: qty 30, 30d supply, fill #1

## 2021-02-23 NOTE — Telephone Encounter (Signed)
-----  Message from York Ram, RN sent at 02/22/2021  3:19 PM EDT -----  ----- Message ----- From: [place], Hershal Coria Sent: 02/22/2021  12:52 PM EDT To: [facility]  Amy Clegg with HF team saw this patient today, he is lost to follow up with [place] for a couple years now.  He apparently has had some VT and she is asking that we re-establish.  I have Asked [place] to get him scheduled with MD or Jonni Sanger but can you guys try and get him back on line with remotes??  Please and thanks  renee

## 2021-02-23 NOTE — Progress Notes (Deleted)
Electrophysiology Office Note Date: 02/23/2021  ID:  Vincent Chandler, Vincent Chandler 1961/03/13, MRN [id number]  PCP: Ann Held, DO Primary Cardiologist: None Electrophysiologist: Virl Axe, MD   CC: Routine ICD follow-up  Vincent Chandler is a 60 y.o. male seen today for Virl Axe, MD for acute visit due to VT and overdue for EP follow up .    Seen in HF clinic 02/22/21. Noted to  have VT in July. Instructed not to drive and set up with EP follow up  Since last being seen in our clinic the patient reports doing ***.  he denies chest pain, palpitations, dyspnea, PND, orthopnea, nausea, vomiting, dizziness, syncope, edema, weight gain, or early satiety. {He/she (caps):30048} has not had ICD shocks.   Device History: Medtronic Single Chamber ICD implanted 2016 for *** History of appropriate therapy: {yes/no:20286}  Past Medical History:  Diagnosis Date   AICD (automatic cardioverter/defibrillator) present    Anginal pain ([place])    Anxiety    Asthma    Atrial fibrillation-postoperative 11/28/2012   Cardiomyopathy    alcohol use related   CHF (congestive heart failure) (HCC)    Chronic back pain    Coronary artery disease    drug eluting stent RCA 2005-EF 30%- s/p CABG x 4; 2/4 patent grafts with SVG-PLOM and SVG-RCA system totally occluded.  There are collaterals from the LAD system to the RCA and the RCA is totally occluded proximally.  There are no collaterals to the LCx territory.  He then underwent successful PCI of the mid left circumflex artery with overlapping Synergy drug-eluting stents   Depression    pt denies   Diabetes mellitus type II dx'd in the 1990's   GERD (gastroesophageal reflux disease)    yrs ago   H/O hiatal hernia    Hemorrhoids    History of hypogonadism    History of kidney stones    Hyperlipidemia    Hypertension    OSA on CPAP    "mask is broken; working on getting a new one" (01/15/2015; 10/03/2017)   Pneumonia    "3-4 times" (10/03/2017)    Urinary incontinence    Past Surgical History:  Procedure Laterality Date   A-FLUTTER ABLATION N/A 09/12/2017   Procedure: A-FLUTTER ABLATION;  Surgeon: Deboraha Sprang, MD;  Location: [place] [facility];  Service: Cardiovascular;  Laterality: N/A;   CARDIAC DEFIBRILLATOR PLACEMENT  01/15/2015   CARDIOVERSION N/A 08/10/2017   Procedure: CARDIOVERSION;  Surgeon: Larey Dresser, MD;  Location: [facility] ENDOSCOPY;  Service: Cardiovascular;  Laterality: N/A;   CORONARY ANGIOPLASTY WITH STENT PLACEMENT  ~ 2003   CORONARY ARTERY BYPASS GRAFT N/A 08/15/2012   Procedure: CORONARY ARTERY BYPASS GRAFTING (CABG);  Surgeon: Ivin Poot, MD;  Location: [place];  Service: Open Heart Surgery;  Laterality: N/A;  Coronary Artery Bypass Grafting Times Four Using Left Internal Mammary Artery and Right Saphenous Leg Vein Harvested Endoscopically   EP IMPLANTABLE DEVICE N/A 01/15/2015   Procedure: ICD Implant;  Surgeon: Deboraha Sprang, MD;  Location: [place] [facility];  Service: Cardiovascular;  Laterality: N/A;   LEFT HEART CATHETERIZATION WITH CORONARY ANGIOGRAM N/A 08/08/2012   Procedure: LEFT HEART CATHETERIZATION WITH CORONARY ANGIOGRAM;  Surgeon: Peter M Martinique, MD;  Location: [facility] CATH LAB;  Service: Cardiovascular;  Laterality: N/A;   LEFT HEART CATHETERIZATION WITH CORONARY/GRAFT ANGIOGRAM N/A 07/21/2014   Procedure: LEFT HEART CATHETERIZATION WITH Beatrix Fetters;  Surgeon: Larey Dresser, MD;  Location: [facility] CATH LAB;  Service:  Cardiovascular;  Laterality: N/A;   MULTIPLE EXTRACTIONS WITH ALVEOLOPLASTY N/A 10/04/2017   Procedure: Extraction of tooth #'s 5 and 14 with alveoloplasty and gross debridement of remaining teeth;  Surgeon: [name], DDS;  Location: [place];  Service: Oral Surgery;  Laterality: N/A;   PERCUTANEOUS CORONARY STENT INTERVENTION (PCI-S)  07/21/2014   Procedure: PERCUTANEOUS CORONARY STENT INTERVENTION (PCI-S);  Surgeon: Larey Dresser, MD;  Location: [facility] CATH LAB;  Service:  Cardiovascular;;   REFRACTIVE SURGERY Bilateral 1990's   RIGHT HEART CATH N/A 10/03/2017   Procedure: RIGHT HEART CATH;  Surgeon: Larey Dresser, MD;  Location: [place] [facility];  Service: Cardiovascular;  Laterality: N/A;   RIGHT HEART CATH N/A 12/11/2018   Procedure: RIGHT HEART CATH;  Surgeon: Larey Dresser, MD;  Location: [place] [facility];  Service: Cardiovascular;  Laterality: N/A;   RIGHT/LEFT HEART CATH AND CORONARY/GRAFT ANGIOGRAPHY N/A 07/29/2020   Procedure: RIGHT/LEFT HEART CATH AND CORONARY/GRAFT ANGIOGRAPHY;  Surgeon: Larey Dresser, MD;  Location: [place] [facility];  Service: Cardiovascular;  Laterality: N/A;   TEE WITHOUT CARDIOVERSION N/A 08/10/2017   Procedure: TRANSESOPHAGEAL ECHOCARDIOGRAM (TEE);  Surgeon: Larey Dresser, MD;  Location: [facility] ENDOSCOPY;  Service: Cardiovascular;  Laterality: N/A;   TONSILLECTOMY  ~ [place] Bilateral 11/28/2017   Procedure: VIDEO BRONCHOSCOPY WITHOUT FLUORO;  Surgeon: Brand Males, MD;  Location: [facility] ENDOSCOPY;  Service: Endoscopy;  Laterality: Bilateral;    Current Outpatient Medications  Medication Sig Dispense Refill   ACCU-CHEK FASTCLIX LANCETS MISC Use as directed once a day.  Dx code: E11.9 100 each 1   albuterol (ACCUNEB) 0.63 MG/3ML nebulizer solution Take 3 mLs (0.63 mg total) by nebulization every 6 (six) hours as needed for wheezing. 75 mL 12   albuterol (VENTOLIN HFA) 108 (90 Base) MCG/ACT inhaler Inhale 2 puffs into the lungs every 6 (six) hours as needed for wheezing or shortness of breath.      allopurinol (ZYLOPRIM) 300 MG tablet Take 1 tablet (300 mg total) by mouth daily. 30 tablet 6   apixaban (ELIQUIS) 5 MG TABS tablet TAKE 1 TABLET BY MOUTH 2 TIMES DAILY. 180 tablet 3   atorvastatin (LIPITOR) 40 MG tablet Take 1 tablet (40 mg total) by mouth at bedtime. 30 tablet 3   blood glucose meter kit and supplies Dispense based on patient and insurance preference. Use as directed once a day. Dx Code E11.9.  1 each 0   Blood Glucose Monitoring Suppl (ACCU-CHEK GUIDE) w/Device KIT 1 each by Does not apply route daily. DX Code: E11.9 1 kit 0   carvedilol (COREG) 3.125 MG tablet Take 1 tablet (3.125 mg total) by mouth 2 (two) times daily. 180 tablet 3   Continuous Blood Gluc Sensor (FREESTYLE LIBRE 2 SENSOR) MISC 1 Device by Does not apply route as directed. (Patient not taking: Reported on 02/22/2021) 2 each 11   digoxin (LANOXIN) 0.125 MG tablet Take 0.5 tablets (0.0625 mg total) by mouth daily. 45 tablet 3   doxycycline (VIBRA-TABS) 100 MG tablet Take 1 tablet (100 mg total) by mouth 2 (two) times daily. 20 tablet 0   fluticasone-salmeterol (ADVAIR HFA) 230-21 MCG/ACT inhaler Inhale 2 puffs into the lungs 2 (two) times daily. 1 each 12   furosemide (LASIX) 20 MG tablet Take 2 tablets (40 mg total) by mouth daily. 60 tablet 3   glucose blood (ACCU-CHEK GUIDE) test strip Use as directed once a day.  Dx code: E11.9 100 each 1  guaiFENesin (MUCINEX) 600 MG 12 hr tablet Take 1 tablet (600 mg total) by mouth 2 (two) times daily as needed for to loosen phlegm.     metFORMIN (GLUCOPHAGE) 500 MG tablet Take 1 tablet (500 mg total) by mouth 2 (two) times daily with a meal. 180 tablet 1   nitroGLYCERIN (NITROSTAT) 0.4 MG SL tablet Place 1 tablet (0.4 mg total) under the tongue every 5 (five) minutes as needed for chest pain. 25 tablet 3   OXYGEN Inhale 3 L into the lungs daily as needed (low oxygen).     predniSONE (DELTASONE) 10 MG tablet TAKE 1 TABLET (10 MG TOTAL) BY MOUTH DAILY WITH BREAKFAST. 30 tablet 3   sacubitril-valsartan (ENTRESTO) 24-26 MG TAKE 1 TABLET BY MOUTH 2 (TWO) TIMES DAILY. 60 tablet 11   spironolactone (ALDACTONE) 25 MG tablet Take 0.5 tablets (12.5 mg total) by mouth daily. 90 tablet 3   No current facility-administered medications for this visit.    Allergies:   Patient has no known allergies.   Social History: Social History   Socioeconomic History   Marital status: Divorced     Spouse name: n/a   Number of children: 0   Years of education: 12th grade   Highest education level: Not on file  Occupational History   Occupation: Data processing manager ---self employed    Employer: [employer]  Tobacco Use   Smoking status: Never   Smokeless tobacco: Current    Types: Chew  Vaping Use   Vaping Use: Never used  Substance and Sexual Activity   Alcohol use: Yes    Alcohol/week: 3.0 standard drinks    Types: 3 Glasses of wine per week   Drug use: No   Sexual activity: Not Currently    Partners: Female  Other Topics Concern   Not on file  Social History Narrative   Exercise-- walking    Lives alone.   Brother lives in [place], [place]   Social Determinants of Health   Financial Resource Strain: Not on file  Food Insecurity: Not on file  Transportation Needs: Not on file  Physical Activity: Not on file  Stress: Not on file  Social Connections: Not on file  Intimate Partner Violence: Not on file    Family History: Family History  Problem Relation Age of Onset   Hypertension Mother    Diabetes Mother    Hypertension Father    Diabetes Father    Hyperlipidemia Father     Review of Systems: All other systems reviewed and are otherwise negative except as noted above.   Physical Exam: There were no vitals filed for this visit.   GEN- The patient is well appearing, alert and oriented x 3 today.   HEENT: normocephalic, atraumatic; sclera clear, conjunctiva pink; hearing intact; oropharynx clear; neck supple, no JVP Lymph- no cervical lymphadenopathy Lungs- Clear to ausculation bilaterally, normal work of breathing.  No wheezes, rales, rhonchi Heart- Regular rate and rhythm, no murmurs, rubs or gallops, PMI not laterally displaced GI- soft, non-tender, non-distended, bowel sounds present, no hepatosplenomegaly Extremities- no clubbing or cyanosis. No edema; DP/PT/radial pulses 2+ bilaterally MS- no significant deformity or  atrophy Skin- warm and dry, no rash or lesion; ICD pocket well healed Psych- euthymic mood, full affect Neuro- strength and sensation are intact  ICD interrogation- reviewed in detail today,  See PACEART report  EKG:  EKG is ordered today. Personal review of EKG ordered today shows  Recent Labs: 12/23/2020: TSH 6.28 02/15/2021: ALT 16;  B Natriuretic Peptide 923.6; BUN 50; Creatinine, Ser 1.68; Hemoglobin 11.4; Platelets 177; Potassium 4.9; Sodium 138   Wt Readings from Last 3 Encounters:  02/22/21 164 lb 3.2 oz (74.5 kg)  01/06/21 154 lb (69.9 kg)  12/30/20 153 lb (69.4 kg)     Other studies Reviewed: Additional studies/ records that were reviewed today include:  Previous EP and CHF notes  Assessment and Plan:  1.  Chronic systolic dysfunction s/p Medtronic single chamber ICD  euvolemic today Stable on an appropriate medical regimen Normal ICD function See Pace Art report No changes today  2. VT *** Continue coreg 3.125 mg BID CMET stable 02/15/2021. Repeat labs ordered 8/23.  3. Atrial fib/flutter  4. CAD s/p CABG Denies ischemic symptoms at this time Follow closely given ventricular arrhythmias Patient with no interventional target on [facility] 07/29/20. It showed occluded SVG-OM and SVG-RCA (known), patent LIMA-LAD and SVG-D, Occluded proximal RCA (known), and newly occluded mid LCX.    Current medicines are reviewed at length with the patient today.   The patient {ACTIONS; HAS/DOES NOT HAVE:19233} concerns regarding his medicines.  The following changes were made today:  {NONE DEFAULTED:18576}  Labs/ tests ordered today include: *** No orders of the defined types were placed in this encounter.    Disposition:   Follow up with {Blank single:19197::"Dr. Allred","Dr. Arlan Organ. Klein","Dr. Camnitz","Dr. Lambert","EP APP"}  {gen number [id number]} {TIME; UNITS DAY/WEEK/MONTH:19136}   Signed, Shirley Friar, PA-C  02/23/2021 8:15 AM  [facility] [address] [place] [address] [phone] (office) [phone] (fax)

## 2021-02-23 NOTE — Telephone Encounter (Signed)
LMOVM for patient to return my call. He needs to send a manual transmission.

## 2021-02-24 ENCOUNTER — Encounter: Payer: Medicare HMO | Admitting: Student

## 2021-02-24 DIAGNOSIS — I5022 Chronic systolic (congestive) heart failure: Secondary | ICD-10-CM

## 2021-02-24 DIAGNOSIS — I472 Ventricular tachycardia: Secondary | ICD-10-CM

## 2021-02-24 DIAGNOSIS — I480 Paroxysmal atrial fibrillation: Secondary | ICD-10-CM

## 2021-02-24 DIAGNOSIS — Z951 Presence of aortocoronary bypass graft: Secondary | ICD-10-CM

## 2021-02-24 NOTE — Progress Notes (Signed)
Subjective:  Patient ID: Vincent Chandler, male    DOB: 08/22/1960,  MRN: [id number]  Chief Complaint  Patient presents with   Foot Ulcer    Right foot ulcer     60 y.o. male presents with the above complaint. Patient presents with right fifth metatarsal blister formation.  Patient states that the blister came back again and has progressively gotten worse.  He already has surgical shoe which she has had in the car.  He just wanted to get evaluated make sure that there is nothing going on.  He does not wear any kind of an insole.  He would like to discuss treatment options.  He is a diabetic.   Review of Systems: Negative except as noted in the HPI. Denies N/V/F/Ch.  Past Medical History:  Diagnosis Date   AICD (automatic cardioverter/defibrillator) present    Anginal pain (HCC)    Anxiety    Asthma    Atrial fibrillation-postoperative 11/28/2012   Cardiomyopathy    alcohol use related   CHF (congestive heart failure) (HCC)    Chronic back pain    Coronary artery disease    drug eluting stent RCA 2005-EF 30%- s/p CABG x 4; 2/4 patent grafts with SVG-PLOM and SVG-RCA system totally occluded.  There are collaterals from the LAD system to the RCA and the RCA is totally occluded proximally.  There are no collaterals to the LCx territory.  He then underwent successful PCI of the mid left circumflex artery with overlapping Synergy drug-eluting stents   Depression    pt denies   Diabetes mellitus type II dx'd in the 1990's   GERD (gastroesophageal reflux disease)    yrs ago   H/O hiatal hernia    Hemorrhoids    History of hypogonadism    History of kidney stones    Hyperlipidemia    Hypertension    OSA on CPAP    "mask is broken; working on getting a new one" (01/15/2015; 10/03/2017)   Pneumonia    "3-4 times" (10/03/2017)   Urinary incontinence     Current Outpatient Medications:    ACCU-CHEK FASTCLIX LANCETS MISC, Use as directed once a day.  Dx code: E11.9, Disp: 100 each, Rfl: 1    albuterol (ACCUNEB) 0.63 MG/3ML nebulizer solution, Take 3 mLs (0.63 mg total) by nebulization every 6 (six) hours as needed for wheezing., Disp: 75 mL, Rfl: 12   albuterol (VENTOLIN HFA) 108 (90 Base) MCG/ACT inhaler, Inhale 2 puffs into the lungs every 6 (six) hours as needed for wheezing or shortness of breath. , Disp: , Rfl:    allopurinol (ZYLOPRIM) 300 MG tablet, Take 1 tablet (300 mg total) by mouth daily., Disp: 30 tablet, Rfl: 6   apixaban (ELIQUIS) 5 MG TABS tablet, TAKE 1 TABLET BY MOUTH 2 TIMES DAILY., Disp: 180 tablet, Rfl: 3   atorvastatin (LIPITOR) 40 MG tablet, Take 1 tablet (40 mg total) by mouth at bedtime., Disp: 30 tablet, Rfl: 3   blood glucose meter kit and supplies, Dispense based on patient and insurance preference. Use as directed once a day. Dx Code E11.9., Disp: 1 each, Rfl: 0   Blood Glucose Monitoring Suppl (ACCU-CHEK GUIDE) w/Device KIT, 1 each by Does not apply route daily. DX Code: E11.9, Disp: 1 kit, Rfl: 0   carvedilol (COREG) 3.125 MG tablet, Take 1 tablet (3.125 mg total) by mouth 2 (two) times daily., Disp: 180 tablet, Rfl: 3   Continuous Blood Gluc Sensor (FREESTYLE LIBRE 2 SENSOR) MISC, 1 Device  by Does not apply route as directed. (Patient not taking: Reported on 02/22/2021), Disp: 2 each, Rfl: 11   digoxin (LANOXIN) 0.125 MG tablet, Take 0.5 tablets (0.0625 mg total) by mouth daily., Disp: 45 tablet, Rfl: 3   doxycycline (VIBRA-TABS) 100 MG tablet, Take 1 tablet (100 mg total) by mouth 2 (two) times daily., Disp: 20 tablet, Rfl: 0   fluticasone-salmeterol (ADVAIR HFA) 230-21 MCG/ACT inhaler, Inhale 2 puffs into the lungs 2 (two) times daily., Disp: 1 each, Rfl: 12   furosemide (LASIX) 20 MG tablet, Take 2 tablets (40 mg total) by mouth daily., Disp: 60 tablet, Rfl: 3   glucose blood (ACCU-CHEK GUIDE) test strip, Use as directed once a day.  Dx code: E11.9, Disp: 100 each, Rfl: 1   guaiFENesin (MUCINEX) 600 MG 12 hr tablet, Take 1 tablet (600 mg total) by mouth 2  (two) times daily as needed for to loosen phlegm., Disp: , Rfl:    metFORMIN (GLUCOPHAGE) 500 MG tablet, Take 1 tablet (500 mg total) by mouth 2 (two) times daily with a meal., Disp: 180 tablet, Rfl: 1   nitroGLYCERIN (NITROSTAT) 0.4 MG SL tablet, Place 1 tablet (0.4 mg total) under the tongue every 5 (five) minutes as needed for chest pain., Disp: 25 tablet, Rfl: 3   OXYGEN, Inhale 3 L into the lungs daily as needed (low oxygen)., Disp: , Rfl:    predniSONE (DELTASONE) 10 MG tablet, TAKE 1 TABLET (10 MG TOTAL) BY MOUTH DAILY WITH BREAKFAST., Disp: 30 tablet, Rfl: 3   sacubitril-valsartan (ENTRESTO) 24-26 MG, TAKE 1 TABLET BY MOUTH 2 (TWO) TIMES DAILY., Disp: 60 tablet, Rfl: 11   spironolactone (ALDACTONE) 25 MG tablet, Take 0.5 tablets (12.5 mg total) by mouth daily., Disp: 90 tablet, Rfl: 3  Social History   Tobacco Use  Smoking Status Never  Smokeless Tobacco Current   Types: Chew    No Known Allergies Objective:  There were no vitals filed for this visit. There is no height or weight on file to calculate BMI. Constitutional Well developed. Well nourished.  Vascular Dorsalis pedis pulses palpable bilaterally. Posterior tibial pulses palpable bilaterally. Capillary refill normal to all digits.  No cyanosis or clubbing noted. Pedal hair growth normal.  Neurologic Normal speech. Oriented to person, place, and time. Epicritic sensation to light touch grossly present bilaterally.  Dermatologic Blister formation noted circumferentially around the fifth metatarsophalangeal joint.  Appears to be friction type in nature.  No superficial purulent drainage noted.  No ulceration noted.  No deep movement of the blister noted no redness noted  Orthopedic: Normal joint ROM without pain or crepitus bilaterally. No visible deformities. No bony tenderness.   Radiographs: None Assessment:   1. Friction blister of the foot, right, initial encounter   2. Uncontrolled type 2 diabetes mellitus  with hyperglycemia ([place])    Plan:  Patient was evaluated and treated and all questions answered.  Right fifth metatarsal blister friction -I explained the patient the etiology of friction blister and various treatment options were discussed.  I discussed with him to change his current shoes out and make shoe gear modification.  However while this is healing up given in setting of diabetes I have asked him to place himself back in a surgical shoe and do Betadine wet-to-dry dressing changes he states understanding will do so immediately. -If there is any redness that persists or becomes cellulitic I will asked him to go to the emergency room right away.  At this time there is no  redness therefore we will hold off on any kind of antibiotics.  There is no clinical signs of infection present  No follow-ups on file.

## 2021-02-24 NOTE — Telephone Encounter (Signed)
Transmission received 02/22/2021. I put him on a remote schedule.

## 2021-02-25 ENCOUNTER — Telehealth: Payer: Self-pay | Admitting: *Deleted

## 2021-02-25 NOTE — Telephone Encounter (Signed)
LMTCB for patient time for his [facility] HF visit.  Pt coming for labs next week would like to tag on to that visit.

## 2021-02-28 ENCOUNTER — Other Ambulatory Visit (HOSPITAL_COMMUNITY): Payer: Self-pay

## 2021-03-01 ENCOUNTER — Other Ambulatory Visit (HOSPITAL_COMMUNITY): Payer: Self-pay

## 2021-03-01 ENCOUNTER — Other Ambulatory Visit (HOSPITAL_COMMUNITY): Payer: Medicare HMO

## 2021-03-01 MED FILL — Apixaban Tab 5 MG: ORAL | 90 days supply | Qty: 180 | Fill #1 | Status: AC

## 2021-03-02 ENCOUNTER — Telehealth (HOSPITAL_COMMUNITY): Payer: Self-pay | Admitting: Cardiology

## 2021-03-02 ENCOUNTER — Other Ambulatory Visit: Payer: Self-pay

## 2021-03-02 ENCOUNTER — Other Ambulatory Visit (HOSPITAL_COMMUNITY): Payer: Self-pay

## 2021-03-02 ENCOUNTER — Ambulatory Visit (HOSPITAL_COMMUNITY)
Admission: RE | Admit: 2021-03-02 | Discharge: 2021-03-02 | Disposition: A | Discharge status: Home / Self Care | Payer: Medicare HMO | Source: Ambulatory Visit | Attending: Cardiology | Admitting: Cardiology

## 2021-03-02 DIAGNOSIS — I472 Ventricular tachycardia, unspecified: Secondary | ICD-10-CM

## 2021-03-02 DIAGNOSIS — I5022 Chronic systolic (congestive) heart failure: Secondary | ICD-10-CM | POA: Insufficient documentation

## 2021-03-02 LAB — CBC
HCT: 35.4 % — ABNORMAL LOW (ref 39.0–52.0)
Hemoglobin: 11.1 g/dL — ABNORMAL LOW (ref 13.0–17.0)
MCH: 39.8 pg — ABNORMAL HIGH (ref 26.0–34.0)
MCHC: 31.4 g/dL (ref 30.0–36.0)
MCV: 126.9 fL — ABNORMAL HIGH (ref 80.0–100.0)
Platelets: 156 10*3/uL (ref 150–400)
RBC: 2.79 MIL/uL — ABNORMAL LOW (ref 4.22–5.81)
RDW: 19.9 % — ABNORMAL HIGH (ref 11.5–15.5)
WBC: 6.8 10*3/uL (ref 4.0–10.5)
nRBC: 1 % — ABNORMAL HIGH (ref 0.0–0.2)

## 2021-03-02 LAB — BASIC METABOLIC PANEL
Anion gap: 9 (ref 5–15)
BUN: 59 mg/dL — ABNORMAL HIGH (ref 6–20)
CO2: 25 mmol/L (ref 22–32)
Calcium: 8.8 mg/dL — ABNORMAL LOW (ref 8.9–10.3)
Chloride: 102 mmol/L (ref 98–111)
Creatinine, Ser: 1.83 mg/dL — ABNORMAL HIGH (ref 0.61–1.24)
GFR, Estimated: 42 mL/min — ABNORMAL LOW (ref >60)
Glucose, Bld: 77 mg/dL (ref 70–99)
Potassium: 4.8 mmol/L (ref 3.5–5.1)
Sodium: 136 mmol/L (ref 135–145)

## 2021-03-02 LAB — DIGOXIN LEVEL: Digoxin Level: 0.9 ng/mL (ref 0.8–2.0)

## 2021-03-02 LAB — MAGNESIUM: Magnesium: 2.3 mg/dL (ref 1.7–2.4)

## 2021-03-02 NOTE — Telephone Encounter (Signed)
As requested Referral for [facility] pulmonary sent via fax

## 2021-03-03 MED ORDER — PIRFENIDONE 267 MG PO TABS: ORAL_TABLET | ORAL | 0 refills | Status: AC

## 2021-03-03 MED ORDER — PIRFENIDONE 267 MG PO TABS: 801.0000 mg | ORAL_TABLET | Freq: Three times a day (TID) | ORAL | 1 refills | Status: DC

## 2021-03-03 NOTE — Telephone Encounter (Signed)
Patient received Esbriet shipment yesterday 03/02/21. Added Month 1 supply as no-print rx.   Escribed rx for maintenance dose to [facility]. Patient can switch to 864m tab three times daily to reduce pll burden once he is stable on Esbriet and managing without side effects.  Future order placed for CMP to be drawn at f/u with Dr. RChase Calleron 03/30/21  DKnox Saliva PharmD, MPH, BCPS Clinical Pharmacist (Rheumatology and Pulmonology)

## 2021-03-03 NOTE — Addendum Note (Signed)
Addended by: Cassandria Anger on: 03/03/2021 12:36 PM   Modules accepted: Orders

## 2021-03-04 ENCOUNTER — Other Ambulatory Visit (HOSPITAL_COMMUNITY): Payer: Self-pay | Admitting: Cardiology

## 2021-03-04 ENCOUNTER — Telehealth: Payer: Self-pay

## 2021-03-04 DIAGNOSIS — I5022 Chronic systolic (congestive) heart failure: Secondary | ICD-10-CM

## 2021-03-04 NOTE — Telephone Encounter (Signed)
Attempted call to patient to request remote transmission that was requested by Allena Katz, NP HF clinic.  Left message to return call.  Pt has office device check scheduled 9/8 with Oda Kilts, PA.

## 2021-03-04 NOTE — Telephone Encounter (Signed)
-----  Message from Rafael Bihari, [place] sent at 02/22/2021 12:40 PM EDT ----- Regarding: Alejandro Mulling, can you send a transmission on [place] in about 1 week? Thank you :) -Janett Billow

## 2021-03-05 DIAGNOSIS — I509 Heart failure, unspecified: Secondary | ICD-10-CM | POA: Diagnosis not present

## 2021-03-05 DIAGNOSIS — I2510 Atherosclerotic heart disease of native coronary artery without angina pectoris: Secondary | ICD-10-CM | POA: Diagnosis not present

## 2021-03-09 DIAGNOSIS — J849 Interstitial pulmonary disease, unspecified: Secondary | ICD-10-CM | POA: Diagnosis not present

## 2021-03-09 DIAGNOSIS — G4733 Obstructive sleep apnea (adult) (pediatric): Secondary | ICD-10-CM | POA: Diagnosis not present

## 2021-03-09 DIAGNOSIS — J9611 Chronic respiratory failure with hypoxia: Secondary | ICD-10-CM | POA: Diagnosis not present

## 2021-03-09 NOTE — Telephone Encounter (Signed)
Attempted call to patient and unable to reach to request remote transmission for Allena Katz, NP at [facility] clinic.  Pt has OV with Oda Kilts, PA on 03/10/2021.

## 2021-03-09 NOTE — Progress Notes (Signed)
Electrophysiology Office Note Date: 03/10/2021  ID:  Sims, Laday 1960-08-13, MRN [id number]  PCP: Ann Held, DO Primary Cardiologist: None Electrophysiologist: Virl Axe, MD   CC: Routine ICD follow-up  Vincent Chandler is a 60 y.o. male seen today for Virl Axe, MD for acute visit due to VT and overdue for EP follow up .    Seen in HF clinic 02/22/21. Noted to  have VT July 14 in the afternoon. He does not remember feeling any symptoms that day. Does not remember being shocked, denies syncope. Instructed not to drive and set up with EP follow up  Pt near his baseline today. Has chronic hypoxic respiratory failure and is SOB with ADLs. He has been referred to [facility]. Edema is mostly well controlled. He has close and on-going follow up with HF clinic. He was in BEAT-HF and has BAT in place. Research will also see at his HF appointment next week. He has not had further VT by device interrogation today.   Device History: Medtronic Single Chamber ICD implanted 2016 for chronic systolic CHF History of appropriate therapy: Yes  Past Medical History:  Diagnosis Date   AICD (automatic cardioverter/defibrillator) present    Anginal pain ([place])    Anxiety    Asthma    Atrial fibrillation-postoperative 11/28/2012   Cardiomyopathy    alcohol use related   CHF (congestive heart failure) (HCC)    Chronic back pain    Coronary artery disease    drug eluting stent RCA 2005-EF 30%- s/p CABG x 4; 2/4 patent grafts with SVG-PLOM and SVG-RCA system totally occluded.  There are collaterals from the LAD system to the RCA and the RCA is totally occluded proximally.  There are no collaterals to the LCx territory.  He then underwent successful PCI of the mid left circumflex artery with overlapping Synergy drug-eluting stents   Depression    pt denies   Diabetes mellitus type II dx'd in the 1990's   GERD (gastroesophageal reflux disease)    yrs ago   H/O hiatal hernia     Hemorrhoids    History of hypogonadism    History of kidney stones    Hyperlipidemia    Hypertension    OSA on CPAP    "mask is broken; working on getting a new one" (01/15/2015; 10/03/2017)   Pneumonia    "3-4 times" (10/03/2017)   Urinary incontinence    Past Surgical History:  Procedure Laterality Date   A-FLUTTER ABLATION N/A 09/12/2017   Procedure: A-FLUTTER ABLATION;  Surgeon: Deboraha Sprang, MD;  Location: [place] [facility];  Service: Cardiovascular;  Laterality: N/A;   CARDIAC DEFIBRILLATOR PLACEMENT  01/15/2015   CARDIOVERSION N/A 08/10/2017   Procedure: CARDIOVERSION;  Surgeon: Larey Dresser, MD;  Location: [facility] ENDOSCOPY;  Service: Cardiovascular;  Laterality: N/A;   CORONARY ANGIOPLASTY WITH STENT PLACEMENT  ~ 2003   CORONARY ARTERY BYPASS GRAFT N/A 08/15/2012   Procedure: CORONARY ARTERY BYPASS GRAFTING (CABG);  Surgeon: Ivin Poot, MD;  Location: [place];  Service: Open Heart Surgery;  Laterality: N/A;  Coronary Artery Bypass Grafting Times Four Using Left Internal Mammary Artery and Right Saphenous Leg Vein Harvested Endoscopically   EP IMPLANTABLE DEVICE N/A 01/15/2015   Procedure: ICD Implant;  Surgeon: Deboraha Sprang, MD;  Location: [place] [facility];  Service: Cardiovascular;  Laterality: N/A;   LEFT HEART CATHETERIZATION WITH CORONARY ANGIOGRAM N/A 08/08/2012   Procedure: LEFT HEART CATHETERIZATION WITH CORONARY ANGIOGRAM;  Surgeon: Peter M Martinique, MD;  Location: [facility] CATH LAB;  Service: Cardiovascular;  Laterality: N/A;   LEFT HEART CATHETERIZATION WITH CORONARY/GRAFT ANGIOGRAM N/A 07/21/2014   Procedure: LEFT HEART CATHETERIZATION WITH Beatrix Fetters;  Surgeon: Larey Dresser, MD;  [facility] LAB;  Service: Cardiovascular;  Laterality: N/A;   MULTIPLE EXTRACTIONS WITH ALVEOLOPLASTY N/A 10/04/2017   Procedure: Extraction of tooth #'s 5 and 14 with alveoloplasty and gross debridement of remaining teeth;  Surgeon: [name], DDS;  Location: [place];   Service: Oral Surgery;  Laterality: N/A;   PERCUTANEOUS CORONARY STENT INTERVENTION (PCI-S)  07/21/2014   Procedure: PERCUTANEOUS CORONARY STENT INTERVENTION (PCI-S);  Surgeon: Larey Dresser, MD;  Location: [facility] CATH LAB;  Service: Cardiovascular;;   REFRACTIVE SURGERY Bilateral 1990's   RIGHT HEART CATH N/A 10/03/2017   Procedure: RIGHT HEART CATH;  Surgeon: Larey Dresser, MD;  Location: [place] [facility];  Service: Cardiovascular;  Laterality: N/A;   RIGHT HEART CATH N/A 12/11/2018   Procedure: RIGHT HEART CATH;  Surgeon: Larey Dresser, MD;  Location: [place] [facility];  Service: Cardiovascular;  Laterality: N/A;   RIGHT/LEFT HEART CATH AND CORONARY/GRAFT ANGIOGRAPHY N/A 07/29/2020   Procedure: RIGHT/LEFT HEART CATH AND CORONARY/GRAFT ANGIOGRAPHY;  Surgeon: Larey Dresser, MD;  Location: [place] [facility];  Service: Cardiovascular;  Laterality: N/A;   TEE WITHOUT CARDIOVERSION N/A 08/10/2017   Procedure: TRANSESOPHAGEAL ECHOCARDIOGRAM (TEE);  Surgeon: Larey Dresser, MD;  Location: [facility] ENDOSCOPY;  Service: Cardiovascular;  Laterality: N/A;   TONSILLECTOMY  ~ [place] Bilateral 11/28/2017   Procedure: VIDEO BRONCHOSCOPY WITHOUT FLUORO;  Surgeon: Brand Males, MD;  Location: [facility] ENDOSCOPY;  Service: Endoscopy;  Laterality: Bilateral;    Current Outpatient Medications  Medication Sig Dispense Refill   ACCU-CHEK FASTCLIX LANCETS MISC Use as directed once a day.  Dx code: E11.9 100 each 1   albuterol (ACCUNEB) 0.63 MG/3ML nebulizer solution Take 3 mLs (0.63 mg total) by nebulization every 6 (six) hours as needed for wheezing. 75 mL 12   albuterol (VENTOLIN HFA) 108 (90 Base) MCG/ACT inhaler Inhale 2 puffs into the lungs every 6 (six) hours as needed for wheezing or shortness of breath.      allopurinol (ZYLOPRIM) 300 MG tablet Take 1 tablet (300 mg total) by mouth daily. 30 tablet 6   apixaban (ELIQUIS) 5 MG TABS tablet TAKE 1 TABLET BY MOUTH 2 TIMES DAILY. 180 tablet 3    atorvastatin (LIPITOR) 40 MG tablet Take 1 tablet (40 mg total) by mouth at bedtime. 30 tablet 3   blood glucose meter kit and supplies Dispense based on patient and insurance preference. Use as directed once a day. Dx Code E11.9. 1 each 0   Blood Glucose Monitoring Suppl (ACCU-CHEK GUIDE) w/Device KIT 1 each by Does not apply route daily. DX Code: E11.9 1 kit 0   carvedilol (COREG) 3.125 MG tablet Take 1 tablet (3.125 mg total) by mouth 2 (two) times daily. 180 tablet 3   digoxin (LANOXIN) 0.125 MG tablet Take 0.5 tablets (0.0625 mg total) by mouth daily. 45 tablet 3   fluticasone-salmeterol (ADVAIR HFA) 230-21 MCG/ACT inhaler Inhale 2 puffs into the lungs 2 (two) times daily. 1 each 12   furosemide (LASIX) 20 MG tablet Take 2 tablets (40 mg total) by mouth daily. 60 tablet 3   glucose blood (ACCU-CHEK GUIDE) test strip Use as directed once a day.  Dx code: E11.9 100 each 1   guaiFENesin ([place]) 600  MG 12 hr tablet Take 1 tablet (600 mg total) by mouth 2 (two) times daily as needed for to loosen phlegm.     metFORMIN (GLUCOPHAGE) 500 MG tablet Take 1 tablet (500 mg total) by mouth 2 (two) times daily with a meal. 180 tablet 1   nitroGLYCERIN (NITROSTAT) 0.4 MG SL tablet Place 1 tablet (0.4 mg total) under the tongue every 5 (five) minutes as needed for chest pain. 25 tablet 3   OXYGEN Inhale 3 L into the lungs daily as needed (low oxygen).     Pirfenidone (ESBRIET) 267 MG TABS Take 3 tablets (801 mg total) by mouth 3 (three) times daily with meals. 270 tablet 1   Pirfenidone (ESBRIET) 267 MG TABS Take 1 tab three times daily for 7 days, then 2 tabs three times daily for 7 days, then 3 tabs three times daily thereafter. 207 tablet 0   predniSONE (DELTASONE) 10 MG tablet TAKE 1 TABLET (10 MG TOTAL) BY MOUTH DAILY WITH BREAKFAST. 30 tablet 3   sacubitril-valsartan (ENTRESTO) 24-26 MG TAKE 1 TABLET BY MOUTH 2 (TWO) TIMES DAILY. 60 tablet 11   spironolactone (ALDACTONE) 25 MG tablet Take 0.5 tablets  (12.5 mg total) by mouth daily. 90 tablet 3   No current facility-administered medications for this visit.    Allergies:   Patient has no known allergies.   Social History: Social History   Socioeconomic History   Marital status: Divorced    Spouse name: n/a   Number of children: 0   Years of education: 12th grade   Highest education level: Not on file  Occupational History   Occupation: Data processing manager ---self employed    Employer: [employer]  Tobacco Use   Smoking status: Never   Smokeless tobacco: Current    Types: Chew  Vaping Use   Vaping Use: Never used  Substance and Sexual Activity   Alcohol use: Yes    Alcohol/week: 3.0 standard drinks    Types: 3 Glasses of wine per week   Drug use: No   Sexual activity: Not Currently    Partners: Female  Other Topics Concern   Not on file  Social History Narrative   Exercise-- walking    Lives alone.   Brother lives in [place], [place]   Social Determinants of Health   Financial Resource Strain: Not on file  Food Insecurity: Not on file  Transportation Needs: Not on file  Physical Activity: Not on file  Stress: Not on file  Social Connections: Not on file  Intimate Partner Violence: Not on file    Family History: Family History  Problem Relation Age of Onset   Hypertension Mother    Diabetes Mother    Hypertension Father    Diabetes Father    Hyperlipidemia Father     Review of Systems: All other systems reviewed and are otherwise negative except as noted above.   Physical Exam: Vitals:   03/10/21 1007  BP: 112/72  Pulse: 92  SpO2: 97%  Weight: 157 lb (71.2 kg)  Height: _0  (1.778 m)     GEN- The patient is well appearing, alert and oriented x 3 today.   HEENT: normocephalic, atraumatic; sclera clear, conjunctiva pink; hearing intact; oropharynx clear; neck supple, no JVP Lymph- no cervical lymphadenopathy Lungs- Clear to ausculation bilaterally, normal work of  breathing.  No wheezes, rales, rhonchi Heart- Regular rate and rhythm, no murmurs, rubs or gallops, PMI not laterally displaced GI- soft, non-tender, non-distended, bowel sounds present,  no hepatosplenomegaly Extremities- no clubbing or cyanosis. No edema; DP/PT/radial pulses 2+ bilaterally MS- no significant deformity or atrophy Skin- warm and dry, no rash or lesion; ICD pocket well healed Psych- euthymic mood, full affect Neuro- strength and sensation are intact  ICD interrogation- reviewed in detail today,  See PACEART report  EKG:  EKG is ordered today. Personal review of EKG ordered today shows  Recent Labs: 12/23/2020: TSH 6.28 02/15/2021: ALT 16; B Natriuretic Peptide 923.6 03/02/2021: BUN 59; Creatinine, Ser 1.83; Hemoglobin 11.1; Magnesium 2.3; Platelets 156; Potassium 4.8; Sodium 136   Wt Readings from Last 3 Encounters:  03/10/21 157 lb (71.2 kg)  02/22/21 164 lb 3.2 oz (74.5 kg)  01/06/21 154 lb (69.9 kg)     Other studies Reviewed: Additional studies/ records that were reviewed today include:  Previous EP and CHF notes  Assessment and Plan:  1.  Chronic systolic dysfunction s/p Medtronic single chamber ICD  euvolemic today Stable on an appropriate medical regimen Normal ICD function See Pace Art report No changes today  2. VT 01/13/21. Asymptomatic. 1 ATP -> 1 shock that was successful.  Continue coreg 3.125 mg BID. Would up-titrate as tolerated at HF visit next week. With lung disease, may need to consider change to bisoprolol to up-titrate. (most cardioselective) Labs today. Previously on amiodarone and taken off due to ILD thought 2/2 to [facility].  3. Atrial fib/flutter Low burden.  Continue Eliquis for CHA2DS2/VASc of at least 4.   4. CAD s/p CABG Denies ischemic symptoms at this time Follow closely given ventricular arrhythmias Patient with no interventional target on [facility] 07/29/20. It showed occluded SVG-OM and SVG-RCA (known), patent LIMA-LAD and SVG-D,  Occluded proximal RCA (known), and newly occluded mid LCX.    5. Pulmonary fibrosis Sees pulmonary O2 dependent Thought at least somewhat due to [facility] lung toxicity. Has improved on prednisone Recent referral placed to [facility].   Current medicines are reviewed at length with the patient today.    Labs/ tests ordered today include:  Orders Placed This Encounter  Procedures   Basic metabolic panel   Digoxin level    Disposition:   Follow up with EP APP  6 months. Sooner with further VT.  He has sent a manual transmission and been added back to remotes after long period of non-compliance.  We reviewed NCDVM guidelines of no driving x 6 months.    Jacalyn Lefevre, PA-C  03/10/2021 10:32 AM  [facility] [address] [place] [address] [phone] (office) [phone] (fax)

## 2021-03-10 ENCOUNTER — Other Ambulatory Visit: Payer: Self-pay

## 2021-03-10 ENCOUNTER — Ambulatory Visit (INDEPENDENT_AMBULATORY_CARE_PROVIDER_SITE_OTHER): Payer: Medicare HMO | Admitting: Student

## 2021-03-10 ENCOUNTER — Encounter: Payer: Self-pay | Admitting: Student

## 2021-03-10 VITALS — BP 112/72 | HR 92 | Ht 70.0 in | Wt 157.0 lb

## 2021-03-10 DIAGNOSIS — J8410 Pulmonary fibrosis, unspecified: Secondary | ICD-10-CM

## 2021-03-10 DIAGNOSIS — I472 Ventricular tachycardia, unspecified: Secondary | ICD-10-CM

## 2021-03-10 DIAGNOSIS — I5022 Chronic systolic (congestive) heart failure: Secondary | ICD-10-CM | POA: Diagnosis not present

## 2021-03-10 DIAGNOSIS — I480 Paroxysmal atrial fibrillation: Secondary | ICD-10-CM | POA: Diagnosis not present

## 2021-03-10 LAB — CUP PACEART INCLINIC DEVICE CHECK
Battery Remaining Longevity: 54 mo
Battery Voltage: 2.99 V
Brady Statistic RV Percent Paced: 0 %
Date Time Interrogation Session: 20220908104525
HighPow Impedance: 55 Ohm
Implantable Lead Implant Date: 20160715
Implantable Lead Location: 753860
Implantable Lead Model: 293
Implantable Lead Serial Number: 382379
Implantable Pulse Generator Implant Date: 20160715
Lead Channel Impedance Value: 380 Ohm
Lead Channel Impedance Value: 380 Ohm
Lead Channel Pacing Threshold Amplitude: 0.75 V
Lead Channel Pacing Threshold Pulse Width: 0.4 ms
Lead Channel Sensing Intrinsic Amplitude: 5 mV
Lead Channel Sensing Intrinsic Amplitude: 5.375 mV
Lead Channel Setting Pacing Amplitude: 2.5 V
Lead Channel Setting Pacing Pulse Width: 0.4 ms
Lead Channel Setting Sensing Sensitivity: 0.3 mV

## 2021-03-10 NOTE — Patient Instructions (Signed)
Medication Instructions:  Your physician recommends that you continue on your current medications as directed. Please refer to the Current Medication list given to you today.  *If you need a refill on your cardiac medications before your next appointment, please call your pharmacy*   Lab Work: TODAY: BMET, Digoxin Level  If you have labs (blood work) drawn today and your tests are completely normal, you will receive your results only by: [place] (if you have MyChart) OR A paper copy in the mail If you have any lab test that is abnormal or we need to change your treatment, we will call you to review the results.   Follow-Up: At [facility], you and your health needs are our priority.  As part of our continuing mission to provide you with exceptional heart care, we have created designated Provider Care Teams.  These Care Teams include your primary Cardiologist (physician) and Advanced Practice Providers (APPs -  Physician Assistants and Nurse Practitioners) who all work together to provide you with the care you need, when you need it.  Your next appointment:   6 month(s)  The format for your next appointment:   In Person  Provider:   Legrand Como "Oda Kilts, PA-C

## 2021-03-11 LAB — BASIC METABOLIC PANEL
BUN/Creatinine Ratio: 32 — ABNORMAL HIGH (ref 9–20)
BUN: 53 mg/dL — ABNORMAL HIGH (ref 6–24)
CO2: 22 mmol/L (ref 20–29)
Calcium: 9.1 mg/dL (ref 8.7–10.2)
Chloride: 102 mmol/L (ref 96–106)
Creatinine, Ser: 1.66 mg/dL — ABNORMAL HIGH (ref 0.76–1.27)
Glucose: 108 mg/dL — ABNORMAL HIGH (ref 65–99)
Potassium: 5.2 mmol/L (ref 3.5–5.2)
Sodium: 138 mmol/L (ref 134–144)
eGFR: 47 mL/min/{1.73_m2} — ABNORMAL LOW (ref >59)

## 2021-03-11 LAB — DIGOXIN LEVEL: Digoxin, Serum: 1.5 ng/mL — ABNORMAL HIGH (ref 0.5–0.9)

## 2021-03-14 ENCOUNTER — Telehealth (HOSPITAL_COMMUNITY): Payer: Self-pay

## 2021-03-14 NOTE — Telephone Encounter (Signed)
Per JM request called and advised patient not to take his Digoxin rx the morning of his appointment  in order for his labs to be obtained on Wednesday 03/16/21 and he agreed understanding.

## 2021-03-14 NOTE — Progress Notes (Signed)
Advanced Heart Failure Clinic Note  Date:  03/16/2021   PCP:  Ann Held, DO  HF Cardiologist: Dr. [name]   History of Present Illness: Vincent Chandler is a 60 y.o. male who has a history of A flutter, chronic systolic heart failure, CAD s/p CABG, CKD Stage III, DMII, OSA .   Admitted 08/07/17 with atrial flutter/RVR and acute on chronic systolic heart failure. Underwent successful DC/CV on 2/8. Required short term milrinone but was able to wean off. HF meds started. Echo this admission with EF 15%. D/C weight 201 pounds.   CPX in 2/19 showed severe functional impairment due to heart failure. However, PFTs were restrictive and high resolution CT chest was concerning for interstitial lung disease.    He has had atrial flutter ablation.    He had RHC in 4/19 showing preserved cardiac output and low filling pressures, but moderate pulmonary hypertension.    He saw pulmonary regarding interstitial lung disease and had bronchoscopy.  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  He has been started on prednisone, cough is much improved on prednisone.     He saw Dr. Caryl Comes, not planning to upgrade ICD to CRT given IVCD but not LBBB-like.   He had a Barostim device placed in 1/20.    I tried him on [place] but he developed orthostasis after starting it and had to stop.   He has been on prednisone for suspected amiodarone pulmonary toxicity/pulmonary fibrosis.  He has had a chronic cough.   With worsened dyspnea,  Dr. [name] took him for [place] in 6/20.  This showed normal filling pressures and low but not markedly low cardiac output. High resolution CT chest in 6/20 showed interstitial lung disease in UIP patttern. He saw pulmonary and prednisone was increased then tapered back down.   Echo in 3/21 showed EF 20-25%, mild RV dilation with moderately decreased systolic function.   LHC/RHC in 1/22 showed low filling pressures, preserved cardiac output, moderate PAH;  occluded SVG-OM and SVG-RCA with patent LIMA-LAD and SVG-D, occluded proximal RCA and mid LCx (LCx occlusion was new).  No interventional target.  Based on cath, I suspected that dyspnea and fatigue was related mostly to his pulmonary fibrosis. Dr. [name] tried him on Tyvaso for PH-ILD, but he did not tolerate it (made his heart race).    He returned for follow up 8/22 and device interrogation showed VT with shock delivered several weeks prior. He had no memory of this. He was volume overloaded, diuretics titrated and arranged for EP follow up and device clinic monitoring.  Today he returns for HF follow up. Feeling more dizzy this past week.  Breathing feels a little worse than usual. Denies CP, edema, or PND/Orthopnea. Chronically sleeps in reclined. Appetite ok. No fever or chills. He is not weighing at home. Taking all medications, held his dose of digoxin today as asked. Watching what he eats and drinks.  MDT ICD Interrogation (personally reviewed): Fluid index remains elevated, crossed threshold 01/02/21, thoracic impedence down. Bursts of AF around this time, longest episode lasting 20 hrs, no further VT, daily activity ~1 hour.  Labs (2/19): free T4 increased but free T3 normal, LDL 86, HDL 30 Labs (3/19): K 4.9, creatinine 1.53, hgb 14.3, LDL 52, ANA negative, RF negative Labs (4/19): K 4.2, creatinine 1.27 Labs (5/19): LDL 69, K 4.4, creatinine 1.36 Labs (8/19): K 5.3, creatinine 2.2 Labs (10/19): K 5, creatinine 1.49 Labs (12/19): hgb  13.4 Labs (2/20): K 4.2, creatinine 1.45, hgb 14.3 Labs (5/20): K 4.2, creatinine 1.81, digoxin 0.6, LDL 16, TGs 396, hgb 13.8 Labs (6/20): creatinine 2 Labs (8/20): LDL 27, Tgs 284, LFTs normal, K 4.5, creatinine 2.3.  Labs (10/20): K 4.4, creatinine 1.79, hgb 13.4 Labs (12/20): LDL 68, TGs 255 Labs (3/21): K 4.3, creatinine 2.19 Labs (6/21): K 4.4, creatinine 1.87 Labs (7/21): K 4.5, creatinine 2.2, digoxin 0.7 Labs (12/21): LDL 54, HDL 56, TGs  178 Labs (1/22): K 4.7 => 5.1, creatinine 1.77 => 2.05 Labs (5/22): K 5.3, creatinine 2.12 Labs (8/22): K 4.9, creatinine 1.68, hgb 11.4   PMH:  1. Atrial fibrillation: Noted post-op CABG.  2. Atrial flutter: Noted during 2/19 admission, DCCV done.  S/p Ablation 3/19.  3. CAD: s/p CABG.  - LHC (1/16):  Patent LIMA-LAD and SVG-D. 80% mLCx and 99% PLOM, total occlusion of SVG-PLOM.  Total occlusion of RCA with occlusion of SVG-RCA.  Patient had DES to mLCx-PLOM.  - LHC (1/22): Occluded SVG-OM and SVG-RCA with patent LIMA-LAD and SVG-D, occluded proximal RCA, occluded mid LCX.  The LCx occlusion was new. No interventional target. 4. HTN 5. Type II diabetes  6. Hyperlipidemia 7. Chronic systolic CHF: Ischemic cardiomyopathy.   - Echo (2/19): EF 15%, mildly dilated LV, mild LVH, inferolateral akinesis, milr MR, mildly dilated RV with severely reduced systolic function.  - CPX (2/19): Peak VO2 12.2, VE/VCO2 slope 53, RER 1.07 => severe limitation from heart failure.  - Medtronic ICD  - RHC (4/19): mean RA 5, PA 54/18 mean 31, mean PCWP 12, CI 2.5, PVR 3.6 WU - Echo (1/20): EF 20% - Barostimulator device placed in 1/20.  - RHC (6/20): mean RA 6, PA 42/17 mean 27, mean PCWP 12, CI 2.29, PVR 3.1  - Echo (3/21): EF 20-25%, mild RV dilation with moderately decreased systolic function. - RHC (1/22): RA mean 1, RV 48/1, PA 46/12 (26), PCWP mean 7, CO 4.46, CI 2.34, PVR 4.2 WU. 8. Interstitial lung disease: PFTs (2/19) were restrictive, raising concern for ILD.  - High resolution CT chest in 2/19: interstitial lung disease concerning for usual interstitial pneumonitis. - Bronchoscopy was done, possible amiodarone lung toxicity causing ILD, now on prednisone.  - High resolution chest CT (6/20): ILD in UIP pattern.   - Ofev 9. CKD stage 3 10. OSA: Home sleep study in 2020 with severe OSA.  11. ABIs (2/19): not significantly abnormal - ABIs (8/21): Normal 12. Carotid dopplers (2/19): Mild disease  only.  - Carotid dopplers (1/20): Minimal stenosis.  13. Pulmonary hypertension: ?Group 3 due to ILD.  14. Nephrolithiasis 15. Gout  Current Outpatient Medications  Medication Sig Dispense Refill   ACCU-CHEK FASTCLIX LANCETS MISC Use as directed once a day.  Dx code: E11.9 100 each 1   albuterol (ACCUNEB) 0.63 MG/3ML nebulizer solution Take 3 mLs (0.63 mg total) by nebulization every 6 (six) hours as needed for wheezing. 75 mL 12   albuterol (VENTOLIN HFA) 108 (90 Base) MCG/ACT inhaler Inhale 2 puffs into the lungs every 6 (six) hours as needed for wheezing or shortness of breath.      allopurinol (ZYLOPRIM) 300 MG tablet Take 1 tablet (300 mg total) by mouth daily. 30 tablet 6   apixaban (ELIQUIS) 5 MG TABS tablet TAKE 1 TABLET BY MOUTH 2 TIMES DAILY. 180 tablet 3   atorvastatin (LIPITOR) 40 MG tablet Take 1 tablet (40 mg total) by mouth at bedtime. 30 tablet 3   blood glucose meter  kit and supplies Dispense based on patient and insurance preference. Use as directed once a day. Dx Code E11.9. 1 each 0   Blood Glucose Monitoring Suppl (ACCU-CHEK GUIDE) w/Device KIT 1 each by Does not apply route daily. DX Code: E11.9 1 kit 0   carvedilol (COREG) 3.125 MG tablet Take 1 tablet (3.125 mg total) by mouth 2 (two) times daily. 180 tablet 3   fluticasone-salmeterol (ADVAIR HFA) 230-21 MCG/ACT inhaler Inhale 2 puffs into the lungs 2 (two) times daily. 1 each 12   furosemide (LASIX) 20 MG tablet Take 2 tablets (40 mg total) by mouth daily. 60 tablet 3   glucose blood (ACCU-CHEK GUIDE) test strip Use as directed once a day.  Dx code: E11.9 100 each 1   guaiFENesin (MUCINEX) 600 MG 12 hr tablet Take 1 tablet (600 mg total) by mouth 2 (two) times daily as needed for to loosen phlegm.     metFORMIN (GLUCOPHAGE) 500 MG tablet Take 1 tablet (500 mg total) by mouth 2 (two) times daily with a meal. 180 tablet 1   nitroGLYCERIN (NITROSTAT) 0.4 MG SL tablet Place 1 tablet (0.4 mg total) under the tongue every 5  (five) minutes as needed for chest pain. 25 tablet 3   OXYGEN Inhale 3 L into the lungs daily as needed (low oxygen).     Pirfenidone (ESBRIET) 267 MG TABS Take 1 tab three times daily for 7 days, then 2 tabs three times daily for 7 days, then 3 tabs three times daily thereafter. 207 tablet 0   predniSONE (DELTASONE) 10 MG tablet TAKE 1 TABLET (10 MG TOTAL) BY MOUTH DAILY WITH BREAKFAST. 30 tablet 3   sacubitril-valsartan (ENTRESTO) 24-26 MG TAKE 1 TABLET BY MOUTH 2 (TWO) TIMES DAILY. 60 tablet 11   spironolactone (ALDACTONE) 25 MG tablet Take 0.5 tablets (12.5 mg total) by mouth daily. 90 tablet 3   digoxin (LANOXIN) 0.125 MG tablet Take 0.5 tablets (0.0625 mg total) by mouth daily. (Patient not taking: Reported on 03/16/2021) 45 tablet 3   No current facility-administered medications for this encounter.    Allergies:   Patient has no known allergies.   Social History:  The patient  reports that he has never smoked. His smokeless tobacco use includes chew. He reports current alcohol use of about 3.0 standard drinks per week. He reports that he does not use drugs.   Family History:  The patient's family history includes Diabetes in his father and mother; Hyperlipidemia in his father; Hypertension in his father and mother.   ROS:  Please see the history of present illness.   All other systems are personally reviewed and negative.   Exam:    BP 100/60   Pulse (!) 104   Wt 73 kg (161 lb)   SpO2 97%   BMI 23.10 kg/m   General:  NAD. Chronically  ill-appearing, arrived in [facility] on oxygen. HEENT: Normal Neck: Supple. No JVD. Carotids 2+ bilat; no bruits. No lymphadenopathy or thryomegaly appreciated. Cor: PMI nondisplaced. Regular rate & rhythm. No rubs, gallops or murmurs. Lungs: Clear Abdomen: Soft, nontender, nondistended. No hepatosplenomegaly. No bruits or masses. Good bowel sounds. Extremities: No cyanosis, clubbing, rash, trace LE edema Neuro: Alert & oriented x 3, cranial nerves  grossly intact. Moves all 4 extremities w/o difficulty. Affect pleasant.  Recent Labs: 12/23/2020: TSH 6.28 02/15/2021: ALT 16; B Natriuretic Peptide 923.6 03/02/2021: Hemoglobin 11.1; Magnesium 2.3; Platelets 156 03/10/2021: BUN 53; Creatinine, Ser 1.66; Potassium 5.2; Sodium 138  Personally reviewed  Wt Readings from Last 3 Encounters:  03/16/21 73 kg (161 lb)  03/10/21 71.2 kg (157 lb)  02/22/21 74.5 kg (164 lb 3.2 oz)   ASSESSMENT AND PLAN:  1. Chronic systolic CHF: Ischemic cardiomyopathy. Medtronic ICD single chamber. 2/19 admission for decompensated HF requiring milrinone. Echo 2/7/019 EF 15% Mildly dilated RV. CPX in 2/19 was suggestive of severe limitation due to HF.   RHC in 4/19 showed normal filling pressures and preserved cardiac output.  Echo in 1/20 with EF 20%.  He had a Barostimulator device placed in 1/20.  RHC in 6/20 showed normal filling pressures, CI 2.29.  Echo 3/21 with EF 20-25%.  RHC (1/22) with RA mean 1, RV 48/1, PA 46/12 (26), PCWP mean 7, CO 4.46, CI 2.34, PVR 4.2 WU. He is not volume overloaded by exam, weight is down 3 lbs, ReDs 28%. Remains NYHA IIIb symptoms, functional class complicated by pulmonary fibrosis. Discussed with Dr. [name], will not push diuresis further today.  - Continue lasix 40 mg daily. BMET & BNP today. - Continue spiro 12.5 mg daily. - Continue digoxin 0.0625 mg daily.  Check digoxin trough today. - Continue carvedilol 3.125 mg bid. (Could consider changing to bisoprolol 2.5 mg with lung disease, depending on price) - Continue Entresto 24/26 mg bid.   - Has IVCD, not LBBB-like.  Saw Dr. Caryl Comes, will not upgrade ICD to CRT.  - With significant ILD, do not think that he would be an LVAD candidate.   2. VT: 01/13/21. Asymptomatic. 1 ATP -> 1 shock that was successful. No further shocks since last interroagtion. No driving for 6 months. 3. Afib/ Atrial flutter: Paroxysmal.  Admitted in 2/19 with severe HF decompensation in setting of atrial flutter  with RVR.  He was cardioverted.  He then had atrial flutter ablation in 3/19. He is off amiodarone due to question of amiodarone lung toxicity. No palpitations.  He has had recent AF burst on formal device interrogation. He denies palpitations. Appears low burden. - CHA2DS2/VASc of at least 4. - He is now off amiodarone (question of lung toxicity, would not re-challenge in future).   - Continue Eliquis 5 mg bid. He has been complaint with this. Denies bleeding other than easily bruising.  4. CAD: S/p CABG.  Last intervention was PCI to LCx in 2016. Repeat cath (1/22) with occluded SVG-OM and SVG-RCA with patent LIMA-LAD and SVG-D, occluded proximal RCA, and newly occluded mid LCX. No targets for revascularization. - Continue atorvastatin.  - Triglycerides improved on Vascepa 2 g bid.  - No ASA with Eliquis use.  5. Pulmonary fibrosis: PFTs in 2/19 were restrictive, and high resolution CT showed interstitial lung disease. Amiodarone was stopped due to concern for possible toxicity.  He has seen [name] => bronchoscopy suggestive of ILD related to amiodarone.  He has been on chronic prednisone.  High resolution CT chest repeated in 6/20, again showing ILD, described at UIP pattern. He has started Ofev. I think that a significant amount of his symptomatology is due to pulmonary fibrosis. He is requesting a 2nd opinion. Discussed with Dr. [name] and arranged referral to [facility], per patient request. - He has been compliant with wearing oxygen with exertion.  - Continue Ofev.    - Continue prednisone 10 mg daily.  - Continue pulmonary followup.   6. OSA: Continue CPAP.    7. CKD: Stage 3. BMET today. - Followup with nephrology.  8. Pulmonary hypertension: Suspect group 3 PH due to lung disease (ILD). RHC (  1/22) with moderate pulmonary arterial hypertension, PVR 4.2 WU. - He did not tolerate Tyvaso for PH-ILD. 9. DM2: He did not tolerate [place].   Follow up with Dr. [name] as scheduled.   [place] FNP 03/16/21  Addendum: Labs results show elevated dig level 1.5, BNP 1237 and SCr elevated at 2.01. Will stop digoxin. Stop lasix and start torsemide 40 mg daily. Will repeat labs in 1 week and have him follow up in 2-3 weeks with APP to re-assess volume. Discussed with Dr. [name] may need RHC soon.

## 2021-03-15 ENCOUNTER — Telehealth (HOSPITAL_COMMUNITY): Payer: Self-pay

## 2021-03-15 NOTE — Telephone Encounter (Signed)
Called patient and left a detailed voice message of the below information to confirm/remind patient of their appointment at the [facility] on 03/16/21.   Patient reminded to bring all medications and/or complete list.

## 2021-03-16 ENCOUNTER — Other Ambulatory Visit: Payer: Self-pay

## 2021-03-16 ENCOUNTER — Encounter (HOSPITAL_COMMUNITY): Payer: Self-pay

## 2021-03-16 ENCOUNTER — Other Ambulatory Visit (HOSPITAL_COMMUNITY): Payer: Self-pay | Admitting: Family Medicine

## 2021-03-16 ENCOUNTER — Encounter: Payer: Medicare HMO | Admitting: *Deleted

## 2021-03-16 ENCOUNTER — Ambulatory Visit (HOSPITAL_COMMUNITY)
Admission: RE | Admit: 2021-03-16 | Discharge: 2021-03-16 | Disposition: A | Discharge status: Home / Self Care | Payer: Medicare HMO | Source: Ambulatory Visit | Attending: Family Medicine | Admitting: Family Medicine

## 2021-03-16 VITALS — BP 100/60 | HR 104 | Wt 161.0 lb

## 2021-03-16 VITALS — BP 93/59 | HR 103 | Wt 161.0 lb

## 2021-03-16 DIAGNOSIS — I4892 Unspecified atrial flutter: Secondary | ICD-10-CM | POA: Diagnosis not present

## 2021-03-16 DIAGNOSIS — N1831 Chronic kidney disease, stage 3a: Secondary | ICD-10-CM

## 2021-03-16 DIAGNOSIS — I472 Ventricular tachycardia, unspecified: Secondary | ICD-10-CM

## 2021-03-16 DIAGNOSIS — E1122 Type 2 diabetes mellitus with diabetic chronic kidney disease: Secondary | ICD-10-CM

## 2021-03-16 DIAGNOSIS — G4733 Obstructive sleep apnea (adult) (pediatric): Secondary | ICD-10-CM

## 2021-03-16 DIAGNOSIS — J8410 Pulmonary fibrosis, unspecified: Secondary | ICD-10-CM

## 2021-03-16 DIAGNOSIS — I480 Paroxysmal atrial fibrillation: Secondary | ICD-10-CM | POA: Diagnosis not present

## 2021-03-16 DIAGNOSIS — Z7952 Long term (current) use of systemic steroids: Secondary | ICD-10-CM | POA: Insufficient documentation

## 2021-03-16 DIAGNOSIS — Z833 Family history of diabetes mellitus: Secondary | ICD-10-CM | POA: Diagnosis not present

## 2021-03-16 DIAGNOSIS — N183 Chronic kidney disease, stage 3 unspecified: Secondary | ICD-10-CM | POA: Diagnosis not present

## 2021-03-16 DIAGNOSIS — Z79899 Other long term (current) drug therapy: Secondary | ICD-10-CM | POA: Diagnosis not present

## 2021-03-16 DIAGNOSIS — Z7951 Long term (current) use of inhaled steroids: Secondary | ICD-10-CM | POA: Insufficient documentation

## 2021-03-16 DIAGNOSIS — Z7901 Long term (current) use of anticoagulants: Secondary | ICD-10-CM | POA: Insufficient documentation

## 2021-03-16 DIAGNOSIS — Z8249 Family history of ischemic heart disease and other diseases of the circulatory system: Secondary | ICD-10-CM | POA: Insufficient documentation

## 2021-03-16 DIAGNOSIS — I130 Hypertensive heart and chronic kidney disease with heart failure and stage 1 through stage 4 chronic kidney disease, or unspecified chronic kidney disease: Secondary | ICD-10-CM | POA: Insufficient documentation

## 2021-03-16 DIAGNOSIS — E785 Hyperlipidemia, unspecified: Secondary | ICD-10-CM | POA: Diagnosis not present

## 2021-03-16 DIAGNOSIS — Z9989 Dependence on other enabling machines and devices: Secondary | ICD-10-CM

## 2021-03-16 DIAGNOSIS — Z951 Presence of aortocoronary bypass graft: Secondary | ICD-10-CM | POA: Diagnosis not present

## 2021-03-16 DIAGNOSIS — I2510 Atherosclerotic heart disease of native coronary artery without angina pectoris: Secondary | ICD-10-CM

## 2021-03-16 DIAGNOSIS — I272 Pulmonary hypertension, unspecified: Secondary | ICD-10-CM | POA: Diagnosis not present

## 2021-03-16 DIAGNOSIS — I255 Ischemic cardiomyopathy: Secondary | ICD-10-CM | POA: Diagnosis not present

## 2021-03-16 DIAGNOSIS — Z7984 Long term (current) use of oral hypoglycemic drugs: Secondary | ICD-10-CM | POA: Diagnosis not present

## 2021-03-16 DIAGNOSIS — Z8349 Family history of other endocrine, nutritional and metabolic diseases: Secondary | ICD-10-CM | POA: Diagnosis not present

## 2021-03-16 DIAGNOSIS — I5022 Chronic systolic (congestive) heart failure: Secondary | ICD-10-CM | POA: Diagnosis not present

## 2021-03-16 DIAGNOSIS — Z006 Encounter for examination for normal comparison and control in clinical research program: Secondary | ICD-10-CM

## 2021-03-16 LAB — BASIC METABOLIC PANEL
Anion gap: 11 (ref 5–15)
BUN: 62 mg/dL — ABNORMAL HIGH (ref 6–20)
CO2: 21 mmol/L — ABNORMAL LOW (ref 22–32)
Calcium: 9.1 mg/dL (ref 8.9–10.3)
Chloride: 106 mmol/L (ref 98–111)
Creatinine, Ser: 2.01 mg/dL — ABNORMAL HIGH (ref 0.61–1.24)
GFR, Estimated: 38 mL/min — ABNORMAL LOW (ref >60)
Glucose, Bld: 107 mg/dL — ABNORMAL HIGH (ref 70–99)
Potassium: 5 mmol/L (ref 3.5–5.1)
Sodium: 138 mmol/L (ref 135–145)

## 2021-03-16 LAB — DIGOXIN LEVEL: Digoxin Level: 1.5 ng/mL (ref 0.8–2.0)

## 2021-03-16 LAB — BRAIN NATRIURETIC PEPTIDE: B Natriuretic Peptide: 1237.2 pg/mL — ABNORMAL HIGH (ref 0.0–100.0)

## 2021-03-16 MED ORDER — TORSEMIDE 40 MG PO TABS: 40.0000 mg | ORAL_TABLET | Freq: Every day | ORAL | 6 refills | Status: AC

## 2021-03-16 MED ORDER — FUROSEMIDE 20 MG PO TABS: 20.0000 mg | ORAL_TABLET | Freq: Every day | ORAL | 11 refills | Status: DC

## 2021-03-16 NOTE — Progress Notes (Signed)
ReDS Vest / Clip - 03/16/21 1300       ReDS Vest / Clip   Station Marker C    Ruler Value 33    ReDS Value Range Low volume    ReDS Actual Value 28

## 2021-03-16 NOTE — Patient Instructions (Signed)
RedsClip done today.  Labs done today. We will contact you only if your labs are abnormal.  No medication changes were made. Please continue all current medications as prescribed.  Your physician recommends that you keep your follow-up appointment with Dr.McLean  If you have any questions or concerns before your next appointment please send [place] a message through [place] or call our office at [phone].    TO LEAVE A MESSAGE FOR THE NURSE SELECT OPTION 2, PLEASE LEAVE A MESSAGE INCLUDING: YOUR NAME DATE OF BIRTH CALL BACK NUMBER REASON FOR CALL**this is important as we prioritize the call backs  YOU WILL RECEIVE A CALL BACK THE SAME DAY AS LONG AS YOU CALL BEFORE 4:00 PM   Do the following things EVERYDAY: Weigh yourself in the morning before breakfast. Write it down and keep it in a log. Take your medicines as prescribed Eat low salt foods--Limit salt (sodium) to 2000 mg per day.  Stay as active as you can everyday Limit all fluids for the day to less than 2 liters   At the [facility], you and your health needs are our priority. As part of our continuing mission to provide you with exceptional heart care, we have created designated Provider Care Teams. These Care Teams include your primary Cardiologist (physician) and Advanced Practice Providers (APPs- Physician Assistants and Nurse Practitioners) who all work together to provide you with the care you need, when you need it.   You may see any of the following providers on your designated Care Team at your next follow up: Dr Glori Bickers Dr Haynes Kerns, NP Lyda Jester, [place] Audry Riles, PharmD   Please be sure to bring in all your medications bottles to every appointment.

## 2021-03-16 NOTE — Telephone Encounter (Signed)
Referral update Referral center will not forward to appropriate department  so referral sent to pulm dept directly fax # [phone]

## 2021-03-18 ENCOUNTER — Ambulatory Visit (INDEPENDENT_AMBULATORY_CARE_PROVIDER_SITE_OTHER): Payer: Medicare HMO

## 2021-03-18 ENCOUNTER — Ambulatory Visit: Payer: Medicare HMO | Admitting: Podiatry

## 2021-03-18 DIAGNOSIS — I255 Ischemic cardiomyopathy: Secondary | ICD-10-CM

## 2021-03-18 NOTE — Research (Signed)
53M visit  Patient is doing ok. He is short of breath still when walking and any activity; will let AE open for now with no new updates. He is doing PT at home to help increase his endurance. He isn't walking much at home. I asked about his diet and he tries to eat 3 meals a day don't always happen. I mention to him about protein shakes he said he only does ensure. I suggested Premier Protein to try. I spoke about the importance of weighing daily every morning and taking his BP some at home. He takes his meds like he is suppose too.  We did titrate him some and his BP stayed the same with no vision changes. I also suggested for him to see an eye dr yearly to stay on top of that. We will see him back in 6 months unless he needs [place] before then I explained that we are happy to see him if need be. No med changes today but will make sure meds are up to date with changes from the past.    Current Outpatient Medications:    ACCU-CHEK FASTCLIX LANCETS MISC, Use as directed once a day.  Dx code: E11.9, Disp: 100 each, Rfl: 1   albuterol (ACCUNEB) 0.63 MG/3ML nebulizer solution, Take 3 mLs (0.63 mg total) by nebulization every 6 (six) hours as needed for wheezing., Disp: 75 mL, Rfl: 12   albuterol (VENTOLIN HFA) 108 (90 Base) MCG/ACT inhaler, Inhale 2 puffs into the lungs every 6 (six) hours as needed for wheezing or shortness of breath. , Disp: , Rfl:    allopurinol (ZYLOPRIM) 300 MG tablet, Take 1 tablet (300 mg total) by mouth daily., Disp: 30 tablet, Rfl: 6   apixaban (ELIQUIS) 5 MG TABS tablet, TAKE 1 TABLET BY MOUTH 2 TIMES DAILY., Disp: 180 tablet, Rfl: 3   atorvastatin (LIPITOR) 40 MG tablet, Take 1 tablet (40 mg total) by mouth at bedtime., Disp: 30 tablet, Rfl: 3   blood glucose meter kit and supplies, Dispense based on patient and insurance preference. Use as directed once a day. Dx Code E11.9., Disp: 1 each, Rfl: 0   Blood Glucose Monitoring Suppl (ACCU-CHEK GUIDE) w/Device KIT, 1 each by Does not  apply route daily. DX Code: E11.9, Disp: 1 kit, Rfl: 0   carvedilol (COREG) 3.125 MG tablet, Take 1 tablet (3.125 mg total) by mouth 2 (two) times daily., Disp: 180 tablet, Rfl: 3   fluticasone-salmeterol (ADVAIR HFA) 230-21 MCG/ACT inhaler, Inhale 2 puffs into the lungs 2 (two) times daily., Disp: 1 each, Rfl: 12   glucose blood (ACCU-CHEK GUIDE) test strip, Use as directed once a day.  Dx code: E11.9, Disp: 100 each, Rfl: 1   guaiFENesin (MUCINEX) 600 MG 12 hr tablet, Take 1 tablet (600 mg total) by mouth 2 (two) times daily as needed for to loosen phlegm., Disp: , Rfl:    metFORMIN (GLUCOPHAGE) 500 MG tablet, Take 1 tablet (500 mg total) by mouth 2 (two) times daily with a meal., Disp: 180 tablet, Rfl: 1   nitroGLYCERIN (NITROSTAT) 0.4 MG SL tablet, Place 1 tablet (0.4 mg total) under the tongue every 5 (five) minutes as needed for chest pain., Disp: 25 tablet, Rfl: 3   OXYGEN, Inhale 3 L into the lungs daily as needed (low oxygen)., Disp: , Rfl:    Pirfenidone (ESBRIET) 267 MG TABS, Take 1 tab three times daily for 7 days, then 2 tabs three times daily for 7 days, then 3 tabs three  times daily thereafter., Disp: 207 tablet, Rfl: 0   predniSONE (DELTASONE) 10 MG tablet, TAKE 1 TABLET (10 MG TOTAL) BY MOUTH DAILY WITH BREAKFAST., Disp: 30 tablet, Rfl: 3   sacubitril-valsartan (ENTRESTO) 24-26 MG, TAKE 1 TABLET BY MOUTH 2 (TWO) TIMES DAILY., Disp: 60 tablet, Rfl: 11   spironolactone (ALDACTONE) 25 MG tablet, Take 0.5 tablets (12.5 mg total) by mouth daily., Disp: 90 tablet, Rfl: 3   torsemide 40 MG TABS, Take 40 mg by mouth daily., Disp: 30 tablet, Rfl: 6

## 2021-03-21 LAB — CUP PACEART REMOTE DEVICE CHECK
Battery Remaining Longevity: 55 mo
Battery Voltage: 2.99 V
Brady Statistic RV Percent Paced: 0 %
Date Time Interrogation Session: 20220916022724
HighPow Impedance: 54 Ohm
Implantable Lead Implant Date: 20160715
Implantable Lead Location: 753860
Implantable Lead Model: 293
Implantable Lead Serial Number: 382379
Implantable Pulse Generator Implant Date: 20160715
Lead Channel Impedance Value: 380 Ohm
Lead Channel Impedance Value: 380 Ohm
Lead Channel Pacing Threshold Amplitude: 0.75 V
Lead Channel Pacing Threshold Pulse Width: 0.4 ms
Lead Channel Sensing Intrinsic Amplitude: 3.875 mV
Lead Channel Sensing Intrinsic Amplitude: 3.875 mV
Lead Channel Setting Pacing Amplitude: 2.5 V
Lead Channel Setting Pacing Pulse Width: 0.4 ms
Lead Channel Setting Sensing Sensitivity: 0.3 mV

## 2021-03-21 NOTE — Telephone Encounter (Signed)
Patient came in for appt on 03/16/2021

## 2021-03-24 NOTE — Progress Notes (Signed)
Remote ICD transmission.   

## 2021-03-25 ENCOUNTER — Other Ambulatory Visit (HOSPITAL_COMMUNITY): Payer: Medicare HMO

## 2021-03-28 ENCOUNTER — Other Ambulatory Visit (HOSPITAL_COMMUNITY): Payer: Self-pay

## 2021-03-28 ENCOUNTER — Other Ambulatory Visit: Payer: Self-pay | Admitting: Sports Medicine

## 2021-03-28 DIAGNOSIS — M1A9XX1 Chronic gout, unspecified, with tophus (tophi): Secondary | ICD-10-CM

## 2021-03-28 MED ORDER — ALLOPURINOL 300 MG PO TABS
300.0000 mg | ORAL_TABLET | Freq: Every day | ORAL | 5 refills | Status: AC
  Filled 2021-03-28: qty 30, 30d supply, fill #0

## 2021-03-28 MED FILL — Sacubitril-Valsartan Tab 24-26 MG: ORAL | 30 days supply | Qty: 60 | Fill #5 | Status: AC

## 2021-03-30 ENCOUNTER — Encounter: Payer: Medicare HMO | Admitting: Internal Medicine

## 2021-03-30 NOTE — Patient Instructions (Signed)
ICD-10-CM   1. ILD (interstitial lung disease) (HCC)  J84.9 Comp Met (CMET)    Hepatic function panel    Ambulatory referral to Pulmonology    2. UIP (usual interstitial pneumonitis) ([place])  J84.112     3. Chronic respiratory failure with hypoxia (HCC)  J96.11     4. Medication monitoring encounter  Z51.81 Hepatic function panel    5. Chronic kidney disease, unspecified CKD stage  N18.9 Comp Met (CMET)    6. Weight loss  R63.4     7. Physical deconditioning  R53.81       You are having progressive pulmonary fibrosis despite nintedanib You are having significant weight loss which could be because of pulmonary fibrosis and heart failure but also nintedanib  Plan  - Check liver function test and chemistry panel today -when showed your liver, kidney and potassium are normal -Refer pharmacist in our office to switch you to pirfenidone   -This drug can also cause significant GI side effects were to having accelerated worsening of lung function and so were trying this is the next alternate standard of care option -You probably will not qualify for clinical trials and pulmonary fibrosis is a care option because of your heart failure -I have written to Dr. Loralie Champagne to see if you need a right heart catheterization to see if you will qualify for inhaled Tyvaso  -However this drug can also be contraindicated in the presence of heart failure  - This drug is to improve your symptoms and quality of life -Refer to [facility] lung transplantation program  -It is possible that on account of your heart failure you could be excluded from a heart lung transplant but I want to make a referral and assess -Continue nintedanib for now -Continue prednisone 10 mg/day for now  -We will try to reduce this in the future -Refer home physical therapy  Follow-up - 4 weeks with nurse practitioner -8-12 weeks with [name] 30-minute visit  -Symptom scores and walking desaturation test at  follow-up

## 2021-03-30 NOTE — Progress Notes (Signed)
 This encounter was created in error - please disregard.

## 2021-03-31 ENCOUNTER — Other Ambulatory Visit (HOSPITAL_COMMUNITY): Payer: Self-pay

## 2021-04-01 IMAGING — CT CT CHEST HIGH RESOLUTION WITHOUT CONTRAST
2 of 5 series · 14 of 36 positions shown, 17 images · non-contrast
Comparison: Chest CT 09/17/2017.

CLINICAL DATA: 58-year-old male with history of increasing
shortness of breath. History of amiodarone use for the past year.

EXAM:
CT CHEST WITHOUT CONTRAST
TECHNIQUE: Multidetector CT imaging of the chest was performed following the
standard protocol without intravenous contrast. High resolution
imaging of the lungs, as well as inspiratory and expiratory imaging,
was performed.

[Series 2: high resolution · axial · 0.71mm/px · z∈[-301,-35]mm · 11 of 154 slices shown, 14 images]
[im 14/154  mediastinal]
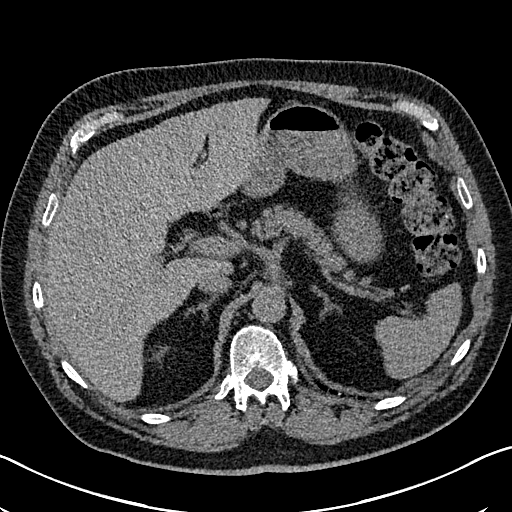
[im 14/154  lung]
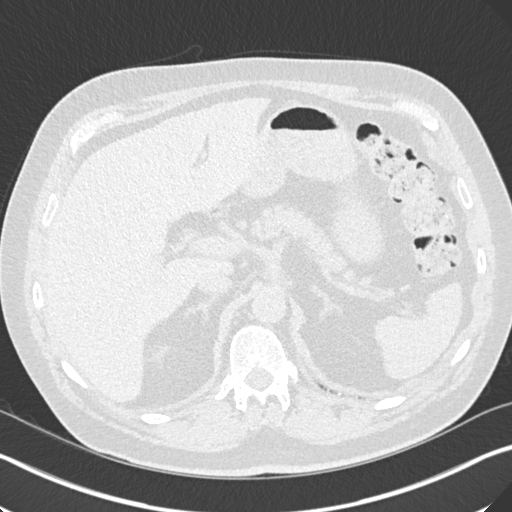
[im 27/154  lung]
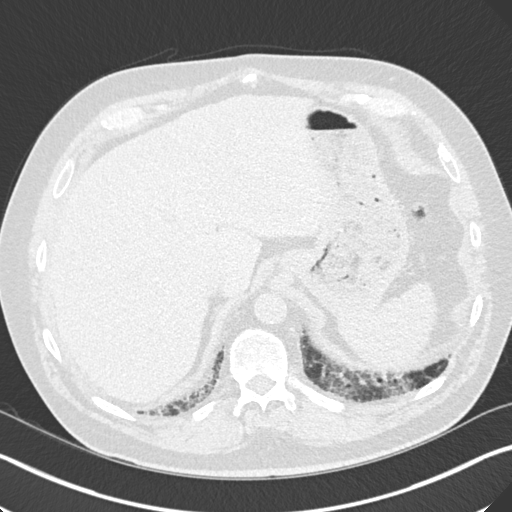
[im 40/154  lung]
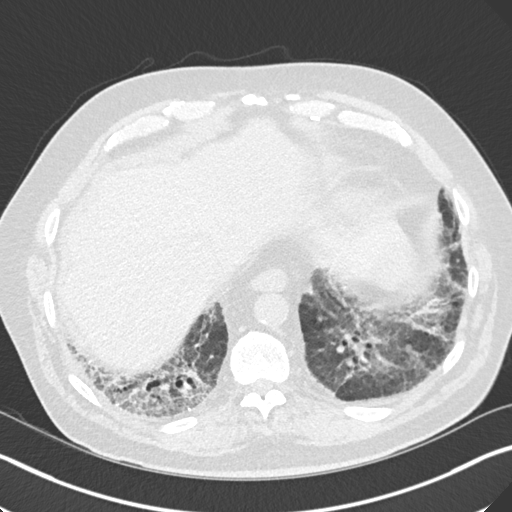
[im 54/154  lung]
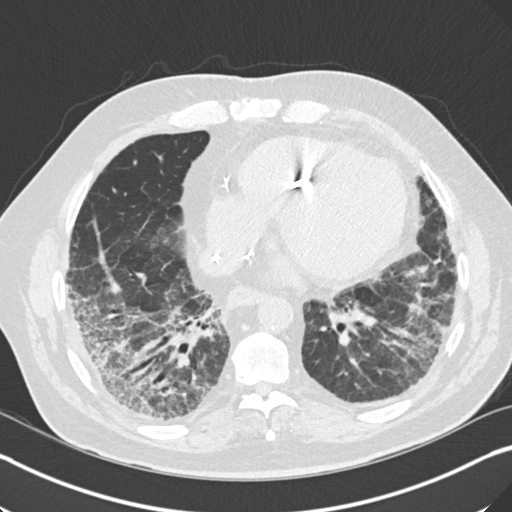
[im 67/154  mediastinal]
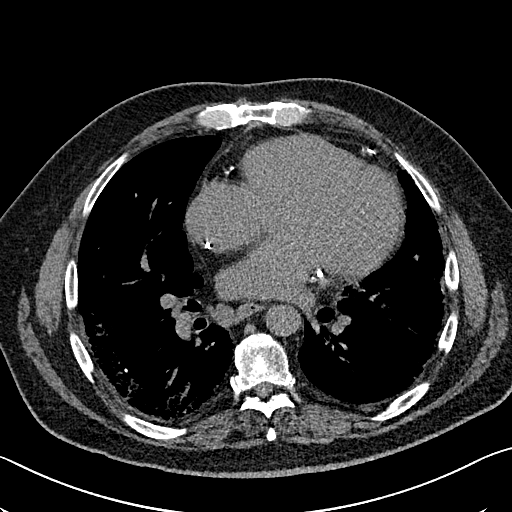
[im 67/154  lung]
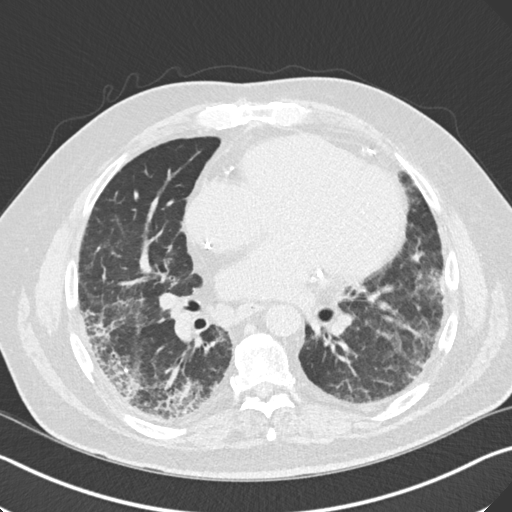
[im 80/154  lung]
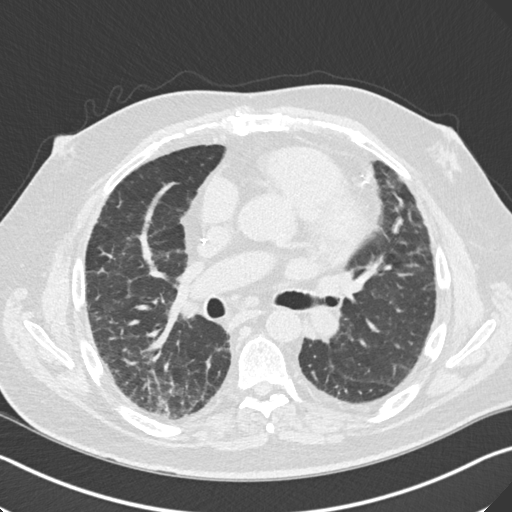
[im 94/154  lung]
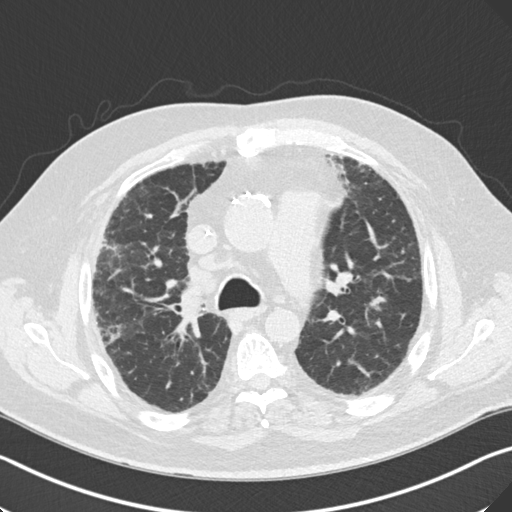
[im 107/154  lung]
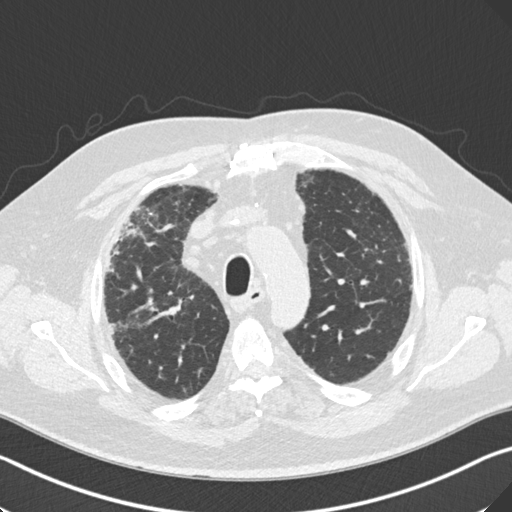
[im 120/154  mediastinal]
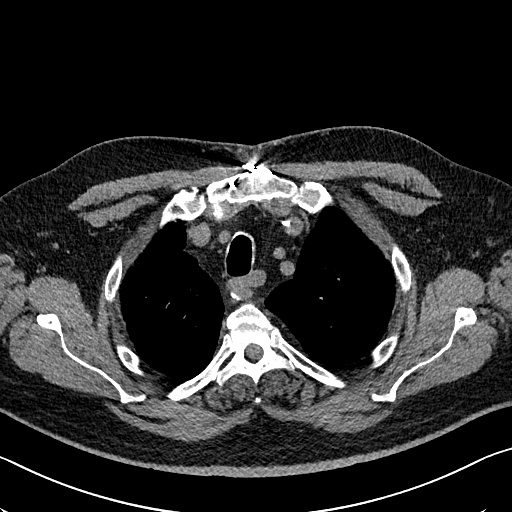
[im 120/154  lung]
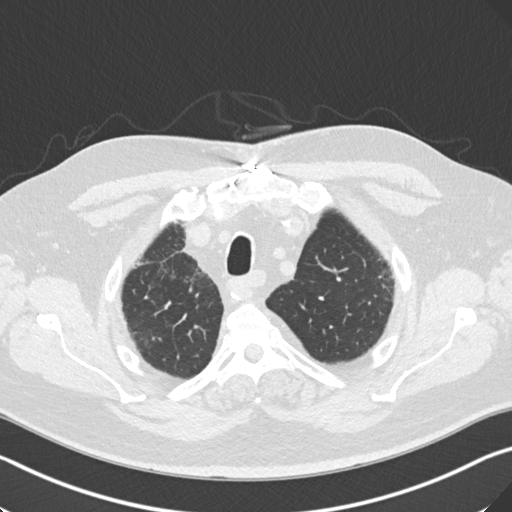
[im 134/154  lung]
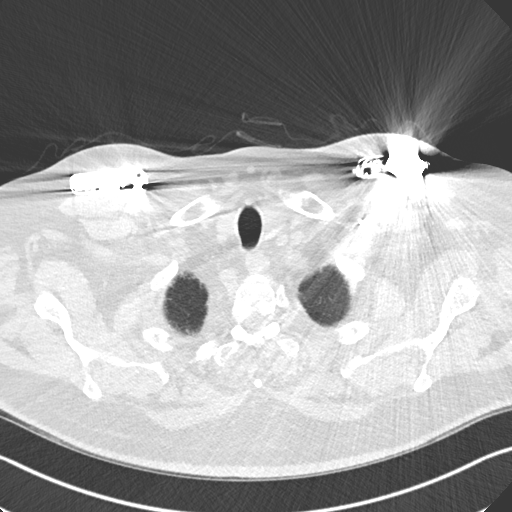
[im 147/154  lung]
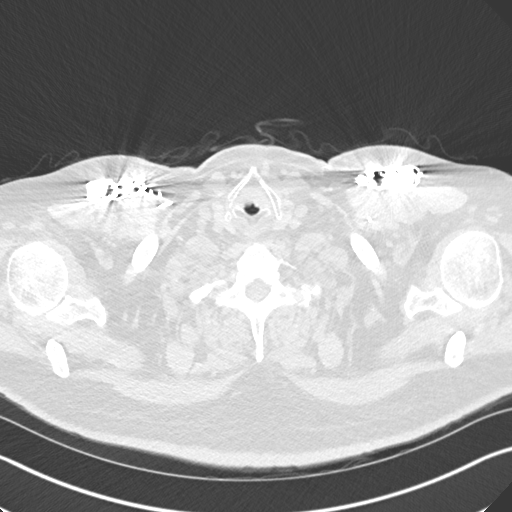

[Series 9: coronal · coronal · 0.60mm/px · 3 of 135 slices shown]
[im 27/135  lung]
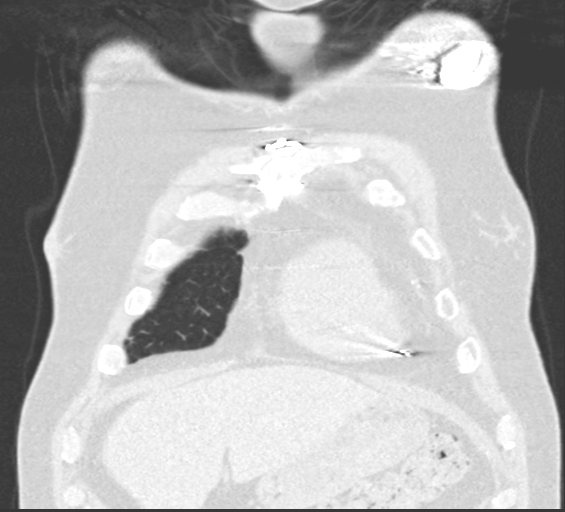
[im 54/135  lung]
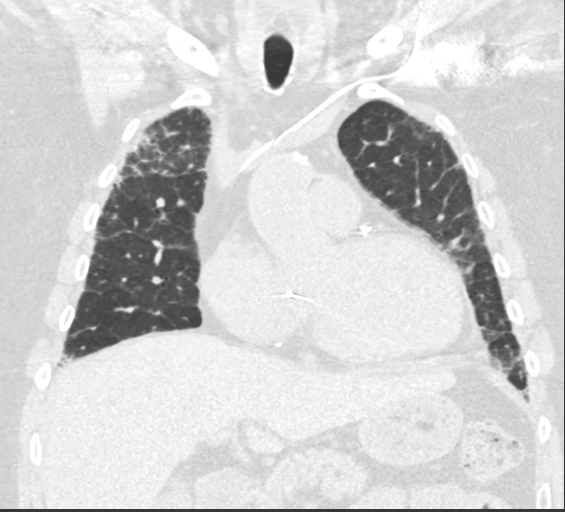
[im 81/135  lung]
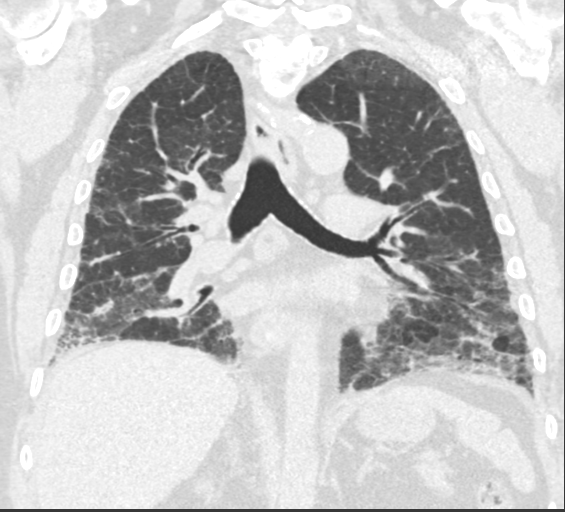

[14 of 36 positions shown; findings below may reference images not displayed]

FINDINGS: Cardiovascular: Heart size is normal. There is no significant
pericardial fluid, thickening or pericardial calcification. There is
aortic atherosclerosis, as well as atherosclerosis of the great
vessels of the mediastinum and the coronary arteries, including
calcified atherosclerotic plaque in the left anterior descending,
left circumflex and right coronary arteries. Status post median
sternotomy for CABG including [REDACTED] to the LAD. Aberrant right
subclavian artery (normal anatomical variant) incidentally noted.

Mediastinum/Nodes: No pathologically enlarged mediastinal or hilar
lymph nodes. Prominent borderline enlarged lower middle mediastinal
lymph node to the right of the esophagus (axial image 86 of series
2), decreased compared to the prior examination. Esophagus is
unremarkable in appearance. No axillary lymphadenopathy.

Lungs/Pleura: High-resolution images demonstrate widespread areas of
ground-glass attenuation, septal thickening, subpleural
reticulation, thickening of the peribronchovascular interstitium,
mild cylindrical traction bronchiectasis and regional areas of
architectural distortion. No frank honeycombing is confidently
identified at this time. Findings have a definitive craniocaudal
gradient and appear minimally progressive compared to the prior
study. Inspiratory and expiratory imaging is unremarkable.

Upper Abdomen: Aortic atherosclerosis.

Musculoskeletal: There are no aggressive appearing lytic or blastic
lesions noted in the visualized portions of the skeleton.
High-resolution images again demonstrate widespread areas of
ground-glass attenuation, septal thickening, subpleural
reticulation, thickening of the peribronchovascular interstitium and
mild cylindrical traction bronchiectasis.
IMPRESSION: 1. Findings in the lungs remain compatible with interstitial lung
disease, with a pattern considered probable usual interstitial
pneumonia (UIP) per current ATS guidelines.
2. Aortic atherosclerosis, in addition to 3 vessel coronary artery
disease. Please note that although the presence of coronary artery
calcium documents the presence of coronary artery disease, the
severity of this disease and any potential stenosis cannot be
assessed on this non-gated CT examination. Assessment for potential
risk factor modification, dietary therapy or pharmacologic therapy
may be warranted, if clinically indicated. Status post median
sternotomy for CABG including [REDACTED] to the LAD.

Aortic Atherosclerosis ([id number]).

## 2021-04-04 ENCOUNTER — Emergency Department (HOSPITAL_COMMUNITY): Payer: Medicare HMO

## 2021-04-04 ENCOUNTER — Encounter (HOSPITAL_COMMUNITY): Payer: Self-pay

## 2021-04-04 ENCOUNTER — Other Ambulatory Visit: Payer: Self-pay

## 2021-04-04 DIAGNOSIS — I509 Heart failure, unspecified: Secondary | ICD-10-CM | POA: Diagnosis not present

## 2021-04-04 DIAGNOSIS — E1165 Type 2 diabetes mellitus with hyperglycemia: Secondary | ICD-10-CM | POA: Diagnosis not present

## 2021-04-04 DIAGNOSIS — D649 Anemia, unspecified: Secondary | ICD-10-CM | POA: Diagnosis not present

## 2021-04-04 DIAGNOSIS — E1122 Type 2 diabetes mellitus with diabetic chronic kidney disease: Secondary | ICD-10-CM | POA: Diagnosis present

## 2021-04-04 DIAGNOSIS — Z95810 Presence of automatic (implantable) cardiac defibrillator: Secondary | ICD-10-CM

## 2021-04-04 DIAGNOSIS — Z66 Do not resuscitate: Secondary | ICD-10-CM | POA: Diagnosis not present

## 2021-04-04 DIAGNOSIS — I4892 Unspecified atrial flutter: Secondary | ICD-10-CM | POA: Diagnosis present

## 2021-04-04 DIAGNOSIS — Y841 Kidney dialysis as the cause of abnormal reaction of the patient, or of later complication, without mention of misadventure at the time of the procedure: Secondary | ICD-10-CM | POA: Diagnosis not present

## 2021-04-04 DIAGNOSIS — K219 Gastro-esophageal reflux disease without esophagitis: Secondary | ICD-10-CM | POA: Diagnosis present

## 2021-04-04 DIAGNOSIS — J45909 Unspecified asthma, uncomplicated: Secondary | ICD-10-CM | POA: Diagnosis not present

## 2021-04-04 DIAGNOSIS — Z452 Encounter for adjustment and management of vascular access device: Secondary | ICD-10-CM | POA: Diagnosis not present

## 2021-04-04 DIAGNOSIS — Z951 Presence of aortocoronary bypass graft: Secondary | ICD-10-CM | POA: Diagnosis not present

## 2021-04-04 DIAGNOSIS — J9611 Chronic respiratory failure with hypoxia: Secondary | ICD-10-CM | POA: Diagnosis not present

## 2021-04-04 DIAGNOSIS — R55 Syncope and collapse: Secondary | ICD-10-CM | POA: Diagnosis not present

## 2021-04-04 DIAGNOSIS — I2510 Atherosclerotic heart disease of native coronary artery without angina pectoris: Secondary | ICD-10-CM | POA: Diagnosis not present

## 2021-04-04 DIAGNOSIS — Z992 Dependence on renal dialysis: Secondary | ICD-10-CM

## 2021-04-04 DIAGNOSIS — I255 Ischemic cardiomyopathy: Secondary | ICD-10-CM | POA: Diagnosis present

## 2021-04-04 DIAGNOSIS — D539 Nutritional anemia, unspecified: Secondary | ICD-10-CM | POA: Diagnosis not present

## 2021-04-04 DIAGNOSIS — Z79899 Other long term (current) drug therapy: Secondary | ICD-10-CM

## 2021-04-04 DIAGNOSIS — I130 Hypertensive heart and chronic kidney disease with heart failure and stage 1 through stage 4 chronic kidney disease, or unspecified chronic kidney disease: Principal | ICD-10-CM | POA: Diagnosis present

## 2021-04-04 DIAGNOSIS — I10 Essential (primary) hypertension: Secondary | ICD-10-CM | POA: Diagnosis present

## 2021-04-04 DIAGNOSIS — T8249XA Other complication of vascular dialysis catheter, initial encounter: Secondary | ICD-10-CM | POA: Diagnosis not present

## 2021-04-04 DIAGNOSIS — S0990XA Unspecified injury of head, initial encounter: Secondary | ICD-10-CM | POA: Diagnosis not present

## 2021-04-04 DIAGNOSIS — D61818 Other pancytopenia: Secondary | ICD-10-CM | POA: Diagnosis not present

## 2021-04-04 DIAGNOSIS — F419 Anxiety disorder, unspecified: Secondary | ICD-10-CM | POA: Diagnosis not present

## 2021-04-04 DIAGNOSIS — G319 Degenerative disease of nervous system, unspecified: Secondary | ICD-10-CM | POA: Diagnosis not present

## 2021-04-04 DIAGNOSIS — I2721 Secondary pulmonary arterial hypertension: Secondary | ICD-10-CM | POA: Diagnosis present

## 2021-04-04 DIAGNOSIS — I5041 Acute combined systolic (congestive) and diastolic (congestive) heart failure: Secondary | ICD-10-CM | POA: Diagnosis not present

## 2021-04-04 DIAGNOSIS — I959 Hypotension, unspecified: Secondary | ICD-10-CM | POA: Diagnosis not present

## 2021-04-04 DIAGNOSIS — E869 Volume depletion, unspecified: Secondary | ICD-10-CM | POA: Diagnosis present

## 2021-04-04 DIAGNOSIS — I480 Paroxysmal atrial fibrillation: Secondary | ICD-10-CM | POA: Diagnosis present

## 2021-04-04 DIAGNOSIS — E872 Acidosis, unspecified: Secondary | ICD-10-CM | POA: Diagnosis present

## 2021-04-04 DIAGNOSIS — I517 Cardiomegaly: Secondary | ICD-10-CM | POA: Diagnosis not present

## 2021-04-04 DIAGNOSIS — M47812 Spondylosis without myelopathy or radiculopathy, cervical region: Secondary | ICD-10-CM | POA: Diagnosis not present

## 2021-04-04 DIAGNOSIS — I25810 Atherosclerosis of coronary artery bypass graft(s) without angina pectoris: Secondary | ICD-10-CM | POA: Diagnosis present

## 2021-04-04 DIAGNOSIS — I5084 End stage heart failure: Secondary | ICD-10-CM | POA: Diagnosis present

## 2021-04-04 DIAGNOSIS — N1832 Chronic kidney disease, stage 3b: Secondary | ICD-10-CM | POA: Diagnosis present

## 2021-04-04 DIAGNOSIS — N170 Acute kidney failure with tubular necrosis: Secondary | ICD-10-CM | POA: Diagnosis present

## 2021-04-04 DIAGNOSIS — R570 Cardiogenic shock: Secondary | ICD-10-CM | POA: Diagnosis present

## 2021-04-04 DIAGNOSIS — I5082 Biventricular heart failure: Secondary | ICD-10-CM | POA: Diagnosis present

## 2021-04-04 DIAGNOSIS — E875 Hyperkalemia: Secondary | ICD-10-CM | POA: Diagnosis not present

## 2021-04-04 DIAGNOSIS — G4733 Obstructive sleep apnea (adult) (pediatric): Secondary | ICD-10-CM | POA: Diagnosis present

## 2021-04-04 DIAGNOSIS — R000 Tachycardia, unspecified: Secondary | ICD-10-CM | POA: Diagnosis not present

## 2021-04-04 DIAGNOSIS — I21A1 Myocardial infarction type 2: Secondary | ICD-10-CM | POA: Diagnosis not present

## 2021-04-04 DIAGNOSIS — Z9981 Dependence on supplemental oxygen: Secondary | ICD-10-CM

## 2021-04-04 DIAGNOSIS — I493 Ventricular premature depolarization: Secondary | ICD-10-CM | POA: Diagnosis not present

## 2021-04-04 DIAGNOSIS — Z6824 Body mass index (BMI) 24.0-24.9, adult: Secondary | ICD-10-CM

## 2021-04-04 DIAGNOSIS — T462X5A Adverse effect of other antidysrhythmic drugs, initial encounter: Secondary | ICD-10-CM | POA: Diagnosis not present

## 2021-04-04 DIAGNOSIS — J849 Interstitial pulmonary disease, unspecified: Secondary | ICD-10-CM | POA: Diagnosis not present

## 2021-04-04 DIAGNOSIS — I214 Non-ST elevation (NSTEMI) myocardial infarction: Secondary | ICD-10-CM | POA: Diagnosis present

## 2021-04-04 DIAGNOSIS — K922 Gastrointestinal hemorrhage, unspecified: Secondary | ICD-10-CM | POA: Diagnosis present

## 2021-04-04 DIAGNOSIS — I5023 Acute on chronic systolic (congestive) heart failure: Secondary | ICD-10-CM | POA: Diagnosis not present

## 2021-04-04 DIAGNOSIS — R42 Dizziness and giddiness: Secondary | ICD-10-CM | POA: Diagnosis not present

## 2021-04-04 DIAGNOSIS — Z7984 Long term (current) use of oral hypoglycemic drugs: Secondary | ICD-10-CM

## 2021-04-04 DIAGNOSIS — E785 Hyperlipidemia, unspecified: Secondary | ICD-10-CM | POA: Diagnosis present

## 2021-04-04 DIAGNOSIS — Z515 Encounter for palliative care: Secondary | ICD-10-CM | POA: Diagnosis not present

## 2021-04-04 DIAGNOSIS — Z833 Family history of diabetes mellitus: Secondary | ICD-10-CM

## 2021-04-04 DIAGNOSIS — T80212A Local infection due to central venous catheter, initial encounter: Secondary | ICD-10-CM

## 2021-04-04 DIAGNOSIS — G9341 Metabolic encephalopathy: Secondary | ICD-10-CM | POA: Diagnosis not present

## 2021-04-04 DIAGNOSIS — J329 Chronic sinusitis, unspecified: Secondary | ICD-10-CM | POA: Diagnosis not present

## 2021-04-04 DIAGNOSIS — R69 Illness, unspecified: Secondary | ICD-10-CM | POA: Diagnosis not present

## 2021-04-04 DIAGNOSIS — N178 Other acute kidney failure: Secondary | ICD-10-CM | POA: Diagnosis not present

## 2021-04-04 DIAGNOSIS — F17220 Nicotine dependence, chewing tobacco, uncomplicated: Secondary | ICD-10-CM | POA: Diagnosis present

## 2021-04-04 DIAGNOSIS — E440 Moderate protein-calorie malnutrition: Secondary | ICD-10-CM | POA: Diagnosis present

## 2021-04-04 DIAGNOSIS — Z20822 Contact with and (suspected) exposure to covid-19: Secondary | ICD-10-CM | POA: Diagnosis not present

## 2021-04-04 DIAGNOSIS — J9811 Atelectasis: Secondary | ICD-10-CM | POA: Diagnosis not present

## 2021-04-04 DIAGNOSIS — Z7189 Other specified counseling: Secondary | ICD-10-CM | POA: Diagnosis not present

## 2021-04-04 DIAGNOSIS — R34 Anuria and oliguria: Secondary | ICD-10-CM | POA: Diagnosis present

## 2021-04-04 DIAGNOSIS — R0902 Hypoxemia: Secondary | ICD-10-CM | POA: Diagnosis not present

## 2021-04-04 DIAGNOSIS — Z7952 Long term (current) use of systemic steroids: Secondary | ICD-10-CM

## 2021-04-04 DIAGNOSIS — N184 Chronic kidney disease, stage 4 (severe): Secondary | ICD-10-CM | POA: Diagnosis not present

## 2021-04-04 DIAGNOSIS — K5900 Constipation, unspecified: Secondary | ICD-10-CM | POA: Diagnosis not present

## 2021-04-04 DIAGNOSIS — I472 Ventricular tachycardia, unspecified: Secondary | ICD-10-CM | POA: Diagnosis not present

## 2021-04-04 DIAGNOSIS — M7989 Other specified soft tissue disorders: Secondary | ICD-10-CM | POA: Diagnosis not present

## 2021-04-04 DIAGNOSIS — I5022 Chronic systolic (congestive) heart failure: Secondary | ICD-10-CM | POA: Diagnosis not present

## 2021-04-04 DIAGNOSIS — N179 Acute kidney failure, unspecified: Secondary | ICD-10-CM

## 2021-04-04 DIAGNOSIS — J9621 Acute and chronic respiratory failure with hypoxia: Secondary | ICD-10-CM | POA: Diagnosis not present

## 2021-04-04 DIAGNOSIS — R531 Weakness: Secondary | ICD-10-CM | POA: Diagnosis not present

## 2021-04-04 DIAGNOSIS — Z955 Presence of coronary angioplasty implant and graft: Secondary | ICD-10-CM

## 2021-04-04 DIAGNOSIS — Z7901 Long term (current) use of anticoagulants: Secondary | ICD-10-CM

## 2021-04-04 DIAGNOSIS — Z9889 Other specified postprocedural states: Secondary | ICD-10-CM | POA: Diagnosis not present

## 2021-04-04 DIAGNOSIS — S199XXA Unspecified injury of neck, initial encounter: Secondary | ICD-10-CM | POA: Diagnosis not present

## 2021-04-04 DIAGNOSIS — R0689 Other abnormalities of breathing: Secondary | ICD-10-CM | POA: Diagnosis not present

## 2021-04-04 DIAGNOSIS — J84112 Idiopathic pulmonary fibrosis: Secondary | ICD-10-CM | POA: Diagnosis present

## 2021-04-04 DIAGNOSIS — Z8249 Family history of ischemic heart disease and other diseases of the circulatory system: Secondary | ICD-10-CM

## 2021-04-04 DIAGNOSIS — R918 Other nonspecific abnormal finding of lung field: Secondary | ICD-10-CM | POA: Diagnosis not present

## 2021-04-04 HISTORY — DX: Hypotension, unspecified: I95.9

## 2021-04-04 LAB — CBC WITH DIFFERENTIAL/PLATELET
Abs Immature Granulocytes: 0.04 10*3/uL (ref 0.00–0.07)
Basophils Absolute: 0 10*3/uL (ref 0.0–0.1)
Basophils Relative: 0 %
Eosinophils Absolute: 0 10*3/uL (ref 0.0–0.5)
Eosinophils Relative: 1 %
HCT: 36.6 % — ABNORMAL LOW (ref 39.0–52.0)
Hemoglobin: 11.5 g/dL — ABNORMAL LOW (ref 13.0–17.0)
Immature Granulocytes: 1 %
Lymphocytes Relative: 11 %
Lymphs Abs: 0.7 10*3/uL (ref 0.7–4.0)
MCH: 40.2 pg — ABNORMAL HIGH (ref 26.0–34.0)
MCHC: 31.4 g/dL (ref 30.0–36.0)
MCV: 128 fL — ABNORMAL HIGH (ref 80.0–100.0)
Monocytes Absolute: 0.8 10*3/uL (ref 0.1–1.0)
Monocytes Relative: 12 %
Neutro Abs: 4.9 10*3/uL (ref 1.7–7.7)
Neutrophils Relative %: 75 %
Platelets: UNDETERMINED 10*3/uL (ref 150–400)
RBC: 2.86 MIL/uL — ABNORMAL LOW (ref 4.22–5.81)
RDW: 17.4 % — ABNORMAL HIGH (ref 11.5–15.5)
Smear Review: UNDETERMINED
WBC: 6.5 10*3/uL (ref 4.0–10.5)
nRBC: 2.6 % — ABNORMAL HIGH (ref 0.0–0.2)

## 2021-04-04 LAB — BASIC METABOLIC PANEL
Anion gap: 10 (ref 5–15)
Anion gap: 18 — ABNORMAL HIGH (ref 5–15)
BUN: 68 mg/dL — ABNORMAL HIGH (ref 6–20)
BUN: 99 mg/dL — ABNORMAL HIGH (ref 6–20)
CO2: 8 mmol/L — ABNORMAL LOW (ref 22–32)
CO2: 16 mmol/L — ABNORMAL LOW (ref 22–32)
Calcium: 4.8 mg/dL — CL (ref 8.9–10.3)
Calcium: 7.9 mg/dL — ABNORMAL LOW (ref 8.9–10.3)
Chloride: 121 mmol/L — ABNORMAL HIGH (ref 98–111)
Chloride: 104 mmol/L (ref 98–111)
Creatinine, Ser: 3.01 mg/dL — ABNORMAL HIGH (ref 0.61–1.24)
Creatinine, Ser: 4.88 mg/dL — ABNORMAL HIGH (ref 0.61–1.24)
GFR, Estimated: 23 mL/min — ABNORMAL LOW (ref >60)
GFR, Estimated: 13 mL/min — ABNORMAL LOW (ref >60)
Glucose, Bld: 76 mg/dL (ref 70–99)
Glucose, Bld: 125 mg/dL — ABNORMAL HIGH (ref 70–99)
Potassium: 3.6 mmol/L (ref 3.5–5.1)
Potassium: 5.1 mmol/L (ref 3.5–5.1)
Sodium: 139 mmol/L (ref 135–145)
Sodium: 138 mmol/L (ref 135–145)

## 2021-04-04 LAB — HEPATIC FUNCTION PANEL
ALT: 12 U/L (ref 0–44)
AST: 22 U/L (ref 15–41)
Albumin: 1.7 g/dL — ABNORMAL LOW (ref 3.5–5.0)
Alkaline Phosphatase: 38 U/L (ref 38–126)
Bilirubin, Direct: 0.2 mg/dL (ref 0.0–0.2)
Indirect Bilirubin: 0.2 mg/dL — ABNORMAL LOW (ref 0.3–0.9)
Total Bilirubin: 0.4 mg/dL (ref 0.3–1.2)
Total Protein: 3.3 g/dL — ABNORMAL LOW (ref 6.5–8.1)

## 2021-04-04 LAB — I-STAT CHEM 8, ED
BUN: >130 mg/dL — ABNORMAL HIGH (ref 6–20)
Calcium, Ion: 0.86 mmol/L — CL (ref 1.15–1.40)
Chloride: 105 mmol/L (ref 98–111)
Creatinine, Ser: 5.7 mg/dL — ABNORMAL HIGH (ref 0.61–1.24)
Glucose, Bld: 101 mg/dL — ABNORMAL HIGH (ref 70–99)
HCT: 37 % — ABNORMAL LOW (ref 39.0–52.0)
Hemoglobin: 12.6 g/dL — ABNORMAL LOW (ref 13.0–17.0)
Potassium: 8.5 mmol/L (ref 3.5–5.1)
Sodium: 127 mmol/L — ABNORMAL LOW (ref 135–145)
TCO2: 16 mmol/L — ABNORMAL LOW (ref 22–32)

## 2021-04-04 LAB — TROPONIN I (HIGH SENSITIVITY): Troponin I (High Sensitivity): 353 ng/L (ref <18)

## 2021-04-04 LAB — LACTIC ACID, PLASMA: Lactic Acid, Venous: 4.6 mmol/L (ref 0.5–1.9)

## 2021-04-04 LAB — GLUCOSE, CAPILLARY: Glucose-Capillary: 34 mg/dL — CL (ref 70–99)

## 2021-04-04 LAB — RESP PANEL BY RT-PCR (FLU A&B, COVID) ARPGX2
Influenza A by PCR: NEGATIVE
Influenza B by PCR: NEGATIVE
SARS Coronavirus 2 by RT PCR: NEGATIVE

## 2021-04-04 LAB — POC OCCULT BLOOD, ED: Fecal Occult Bld: POSITIVE — AB

## 2021-04-04 LAB — MAGNESIUM: Magnesium: 1.2 mg/dL — ABNORMAL LOW (ref 1.7–2.4)

## 2021-04-04 LAB — CBG MONITORING, ED: Glucose-Capillary: 104 mg/dL — ABNORMAL HIGH (ref 70–99)

## 2021-04-04 MED ORDER — MAGNESIUM SULFATE 2 GM/50ML IV SOLN
2.0000 g | Freq: Once | INTRAVENOUS | Status: AC
  Administered 2021-04-04: 2 g via INTRAVENOUS
  Filled 2021-04-04: qty 50

## 2021-04-04 MED ORDER — PANTOPRAZOLE SODIUM 40 MG IV SOLR
40.0000 mg | Freq: Two times a day (BID) | INTRAVENOUS | Status: DC
  Administered 2021-04-05 – 2021-04-07 (×6): 40 mg via INTRAVENOUS
  Filled 2021-04-04 (×6): qty 40

## 2021-04-04 MED ORDER — DEXTROSE-NACL 5-0.9 % IV SOLN: INTRAVENOUS | Status: DC

## 2021-04-04 MED ORDER — SODIUM ZIRCONIUM CYCLOSILICATE 10 G PO PACK
10.0000 g | PACK | Freq: Once | ORAL | Status: AC
  Administered 2021-04-04: 10 g via ORAL
  Filled 2021-04-04: qty 1

## 2021-04-04 MED ORDER — SODIUM CHLORIDE 0.9 % IV SOLN
2.0000 g | Freq: Once | INTRAVENOUS | Status: AC
  Administered 2021-04-04: 2 g via INTRAVENOUS
  Filled 2021-04-04: qty 20

## 2021-04-04 MED ORDER — CALCIUM GLUCONATE-NACL 1-0.675 GM/50ML-% IV SOLN
1.0000 g | Freq: Once | INTRAVENOUS | Status: AC
  Administered 2021-04-04: 1000 mg via INTRAVENOUS
  Filled 2021-04-04: qty 50

## 2021-04-04 MED ORDER — INSULIN ASPART 100 UNIT/ML IV SOLN
5.0000 [IU] | Freq: Once | INTRAVENOUS | Status: AC
  Administered 2021-04-04: 5 [IU] via INTRAVENOUS

## 2021-04-04 MED ORDER — PANTOPRAZOLE SODIUM 40 MG IV SOLR
40.0000 mg | Freq: Once | INTRAVENOUS | Status: AC
  Administered 2021-04-04: 40 mg via INTRAVENOUS
  Filled 2021-04-04: qty 40

## 2021-04-04 MED ORDER — ALBUTEROL SULFATE HFA 108 (90 BASE) MCG/ACT IN AERS
2.0000 | INHALATION_SPRAY | Freq: Four times a day (QID) | RESPIRATORY_TRACT | Status: DC | PRN
  Administered 2021-04-13 – 2021-04-14 (×4): 2 via RESPIRATORY_TRACT
  Filled 2021-04-04 (×2): qty 6.7

## 2021-04-04 MED ORDER — ALBUTEROL SULFATE HFA 108 (90 BASE) MCG/ACT IN AERS
4.0000 | INHALATION_SPRAY | Freq: Once | RESPIRATORY_TRACT | Status: AC
  Administered 2021-04-04: 4 via RESPIRATORY_TRACT
  Filled 2021-04-04: qty 6.7

## 2021-04-04 MED ORDER — DEXTROSE 50 % IV SOLN
INTRAVENOUS | Status: AC
  Administered 2021-04-04: 25 g via INTRAVENOUS
  Filled 2021-04-04: qty 50

## 2021-04-04 MED ORDER — DOCUSATE SODIUM 100 MG PO CAPS
100.0000 mg | ORAL_CAPSULE | Freq: Two times a day (BID) | ORAL | Status: DC | PRN
  Administered 2021-04-07 – 2021-04-10 (×2): 100 mg via ORAL
  Filled 2021-04-04 (×2): qty 1

## 2021-04-04 MED ORDER — LACTATED RINGERS IV BOLUS
500.0000 mL | Freq: Once | INTRAVENOUS | Status: AC
  Administered 2021-04-04: 500 mL via INTRAVENOUS

## 2021-04-04 MED ORDER — ALBUTEROL SULFATE 0.63 MG/3ML IN NEBU: 1.0000 | INHALATION_SOLUTION | Freq: Four times a day (QID) | RESPIRATORY_TRACT | Status: DC | PRN

## 2021-04-04 MED ORDER — MOMETASONE FURO-FORMOTEROL FUM 200-5 MCG/ACT IN AERO
2.0000 | INHALATION_SPRAY | Freq: Two times a day (BID) | RESPIRATORY_TRACT | Status: DC
  Administered 2021-04-06 – 2021-04-14 (×16): 2 via RESPIRATORY_TRACT
  Filled 2021-04-04: qty 8.8

## 2021-04-04 MED ORDER — SODIUM BICARBONATE 8.4 % IV SOLN
100.0000 meq | Freq: Once | INTRAVENOUS | Status: AC
  Administered 2021-04-04: 100 meq via INTRAVENOUS
  Filled 2021-04-04: qty 50

## 2021-04-04 MED ORDER — DEXTROSE 50 % IV SOLN
1.0000 | Freq: Once | INTRAVENOUS | Status: AC
  Administered 2021-04-04: 50 mL via INTRAVENOUS
  Filled 2021-04-04: qty 50

## 2021-04-04 MED ORDER — SODIUM CHLORIDE 0.9 % IV SOLN
1.0000 g | INTRAVENOUS | Status: DC
  Administered 2021-04-05 – 2021-04-07 (×3): 1 g via INTRAVENOUS
  Filled 2021-04-04 (×3): qty 10

## 2021-04-04 MED ORDER — SODIUM CHLORIDE 0.9 % IV SOLN: INTRAVENOUS | Status: AC

## 2021-04-04 MED ORDER — DEXTROSE 50 % IV SOLN
25.0000 g | INTRAVENOUS | Status: AC
Start: 2021-04-05 — End: 2021-04-04

## 2021-04-04 MED ORDER — POLYETHYLENE GLYCOL 3350 17 G PO PACK
17.0000 g | PACK | Freq: Every day | ORAL | Status: DC | PRN
  Administered 2021-04-07: 17 g via ORAL
  Filled 2021-04-04: qty 1

## 2021-04-04 MED ORDER — INSULIN ASPART 100 UNIT/ML IJ SOLN
0.0000 [IU] | INTRAMUSCULAR | Status: DC
  Administered 2021-04-05: 2 [IU] via SUBCUTANEOUS
  Administered 2021-04-05: 3 [IU] via SUBCUTANEOUS
  Administered 2021-04-05: 2 [IU] via SUBCUTANEOUS
  Administered 2021-04-05: 1 [IU] via SUBCUTANEOUS
  Administered 2021-04-06: 2 [IU] via SUBCUTANEOUS
  Administered 2021-04-06: 1 [IU] via SUBCUTANEOUS
  Administered 2021-04-06: 2 [IU] via SUBCUTANEOUS
  Administered 2021-04-06: 1 [IU] via SUBCUTANEOUS
  Administered 2021-04-06 (×2): 2 [IU] via SUBCUTANEOUS
  Administered 2021-04-07 (×2): 3 [IU] via SUBCUTANEOUS
  Administered 2021-04-07: 1 [IU] via SUBCUTANEOUS
  Administered 2021-04-07: 5 [IU] via SUBCUTANEOUS
  Administered 2021-04-07: 2 [IU] via SUBCUTANEOUS
  Administered 2021-04-07 – 2021-04-08 (×2): 3 [IU] via SUBCUTANEOUS
  Administered 2021-04-08: 1 [IU] via SUBCUTANEOUS
  Administered 2021-04-08 (×3): 3 [IU] via SUBCUTANEOUS
  Administered 2021-04-08: 2 [IU] via SUBCUTANEOUS
  Administered 2021-04-09: 5 [IU] via SUBCUTANEOUS
  Administered 2021-04-09 (×3): 2 [IU] via SUBCUTANEOUS
  Administered 2021-04-09: 3 [IU] via SUBCUTANEOUS
  Administered 2021-04-09: 2 [IU] via SUBCUTANEOUS
  Administered 2021-04-10: 3 [IU] via SUBCUTANEOUS
  Administered 2021-04-10 (×2): 2 [IU] via SUBCUTANEOUS

## 2021-04-04 NOTE — Consult Note (Signed)
[facility] Renal Consultation Note  Requesting MD: Dykstra Indication for Consultation: A on CRF and hyperkalemia  HPI:  Vincent Chandler is a 60 y.o. male with DM, OSA, CAD s/pCABG and cardiomyopathy-  EF as low as 15- now 20-25% followed in advanced HF clinic-  moderate PAH/ILD- ICD-  on entresto and aldactone and dig-  possibly more symptomatic of late-  SOB.  He also has CKD likely cardiorenal followed by [facility]-  crt on 9/8 was 1.66, then on 9/14 2.0 at which time lasix was changed to torsemide and dig was stopped-  K was 5.0.  he presents to ER today with weakness and syncope- dark stools and hypotension-  ISTAT showed k of 8.5-  crt of 5.7- hgb 12.6-  more formal labs pending-  giving IV calcium/insulin d50 and sodium bicarb as well as lokelma 10.  Giving lactated ringers.  He is pretty alert, able to answer questions-  says this extreme weakness has been present the last 3-4 days, unsure what has changed.  He has been taking him meds as prescribed -  weak LE's fell and hit his head-  no significant improvement yet but still has not received all of hyperkalemia drugs yet   BMP back later...Marland Kitchen. crt 3-  k of 3.6- calcium of 4.8-  albumin of 1.7--- corrected calcium  6.6- lactate of 4.6  Creat  Date/Time Value Ref Range Status  11/26/2020 04:48 PM 2.12 (H) 0.70 - 1.33 mg/dL Final    Comment:    For patients >17 years of age, the reference limit for Creatinine is approximately 13% higher for people identified as African-American. Marland Kitchen   07/07/2020 03:07 PM 1.77 (H) 0.70 - 1.33 mg/dL Final    Comment:    For patients >4 years of age, the reference limit for Creatinine is approximately 13% higher for people identified as African-American. Marland Kitchen   02/07/2019 04:06 PM 2.30 (H) 0.70 - 1.33 mg/dL Final    Comment:    For patients >80 years of age, the reference limit for Creatinine is approximately 13% higher for people identified as African-American. Marland Kitchen   03/22/2016 02:54 PM 1.63  (H) 0.70 - 1.33 mg/dL Final    Comment:      For patients > or = 59 years of age: The upper reference limit for Creatinine is approximately 13% higher for people identified as African-American.     03/01/2016 02:58 PM 1.53 (H) 0.70 - 1.33 mg/dL Final    Comment:      For patients > or = 59 years of age: The upper reference limit for Creatinine is approximately 13% higher for people identified as African-American.     12/14/2015 03:08 PM 1.41 (H) 0.70 - 1.33 mg/dL Final    Comment:      For patients > or = 59 years of age: The upper reference limit for Creatinine is approximately 13% higher for people identified as African-American.     07/14/2015 03:47 PM 1.43 (H) 0.70 - 1.33 mg/dL Final   Creatinine, Ser  Date/Time Value Ref Range Status  04/22/2021 08:22 PM 3.01 (H) 0.61 - 1.24 mg/dL Final    Comment:    DELTA CHECK NOTED  04/30/2021 07:29 PM 5.70 (H) 0.61 - 1.24 mg/dL Final  03/16/2021 02:22 PM 2.01 (H) 0.61 - 1.24 mg/dL Final  03/10/2021 10:46 AM 1.66 (H) 0.76 - 1.27 mg/dL Final  03/02/2021 01:59 PM 1.83 (H) 0.61 - 1.24 mg/dL Final  02/15/2021 12:44 PM 1.68 (H) 0.61 - 1.24  mg/dL Final  01/06/2021 02:18 PM 1.58 (H) 0.40 - 1.50 mg/dL Final  12/27/2020 12:32 PM 1.52 (H) 0.61 - 1.24 mg/dL Final  12/23/2020 02:20 PM 1.86 (H) 0.40 - 1.50 mg/dL Final  12/14/2020 12:55 PM 1.51 (H) 0.61 - 1.24 mg/dL Final  08/30/2020 02:30 PM 1.75 (H) 0.61 - 1.24 mg/dL Final  08/24/2020 12:46 PM 1.88 (H) 0.61 - 1.24 mg/dL Final  08/13/2020 01:11 PM 1.71 (H) 0.61 - 1.24 mg/dL Final  07/29/2020 10:32 AM 1.40 (H) 0.61 - 1.24 mg/dL Final  07/21/2020 12:18 PM 2.05 (H) 0.61 - 1.24 mg/dL Final  06/14/2020 02:02 PM 1.55 (H) 0.40 - 1.50 mg/dL Final  04/27/2020 10:57 AM 2.11 (H) 0.61 - 1.24 mg/dL Final  01/27/2020 02:43 PM 2.20 (H) 0.40 - 1.50 mg/dL Final  01/26/2020 10:28 AM 2.23 (H) 0.61 - 1.24 mg/dL Final  12/09/2019 02:47 PM 1.87 (H) 0.61 - 1.24 mg/dL Final  09/18/2019 03:08 PM 2.19 (H) 0.61 -  1.24 mg/dL Final  06/19/2019 12:35 PM 1.66 (H) 0.61 - 1.24 mg/dL Final  04/09/2019 02:15 PM 1.79 (H) 0.40 - 1.50 mg/dL Final  03/07/2019 10:56 AM 1.90 (H) 0.61 - 1.24 mg/dL Final  12/11/2018 11:09 AM 2.00 (H) 0.61 - 1.24 mg/dL Final  12/03/2018 11:37 AM 1.79 (H) 0.61 - 1.24 mg/dL Final  11/14/2018 02:08 PM 1.81 (H) 0.61 - 1.24 mg/dL Final  08/21/2018 04:31 PM 1.45 (H) 0.61 - 1.24 mg/dL Final  07/25/2018 02:31 AM 1.57 (H) 0.61 - 1.24 mg/dL Final  07/24/2018 06:19 AM 1.37 (H) 0.61 - 1.24 mg/dL Final  07/15/2018 02:34 PM 1.61 (H) 0.61 - 1.24 mg/dL Final  07/10/2018 05:00 PM 1.80 (H) 0.76 - 1.27 mg/dL Final  06/13/2018 11:02 AM 1.61 (H) 0.61 - 1.24 mg/dL Final  04/24/2018 01:53 PM 1.49 (H) 0.76 - 1.27 mg/dL Final  02/14/2018 12:50 PM 2.20 (H) 0.61 - 1.24 mg/dL Final  02/14/2018 10:20 AM 2.54 (H) 0.61 - 1.24 mg/dL Final  01/07/2018 12:08 PM 1.52 (H) 0.61 - 1.24 mg/dL Final  11/20/2017 05:48 PM 1.36 0.40 - 1.50 mg/dL Final  11/19/2017 02:14 PM 1.24 0.61 - 1.24 mg/dL Final  10/30/2017 02:07 PM 1.49 (H) 0.61 - 1.24 mg/dL Final  10/19/2017 12:38 PM 1.40 (H) 0.61 - 1.24 mg/dL Final  10/04/2017 03:50 AM 1.27 (H) 0.61 - 1.24 mg/dL Final  10/01/2017 01:30 PM 1.31 (H) 0.61 - 1.24 mg/dL Final  09/10/2017 10:21 AM 1.48 (H) 0.61 - 1.24 mg/dL Final  09/07/2017 09:55 AM 1.53 (H) 0.76 - 1.27 mg/dL Final  09/03/2017 10:04 AM 1.82 (H) 0.61 - 1.24 mg/dL Final  08/27/2017 12:27 PM 1.72 (H) 0.61 - 1.24 mg/dL Final  08/17/2017 04:22 AM 1.62 (H) 0.61 - 1.24 mg/dL Final  08/16/2017 05:56 AM 2.05 (H) 0.61 - 1.24 mg/dL Final  08/15/2017 04:12 AM 2.44 (H) 0.61 - 1.24 mg/dL Final  08/14/2017 05:15 AM 1.82 (H) 0.61 - 1.24 mg/dL Final  08/13/2017 04:52 AM 2.26 (H) 0.61 - 1.24 mg/dL Final     PMHx:   Past Medical History:  Diagnosis Date   AICD (automatic cardioverter/defibrillator) present    Anginal pain ([place])    Anxiety    Asthma    Atrial fibrillation-postoperative 11/28/2012   Cardiomyopathy     alcohol use related   CHF (congestive heart failure) (HCC)    Chronic back pain    Coronary artery disease    drug eluting stent RCA 2005-EF 30%- s/p CABG x 4; 2/4 patent grafts with SVG-PLOM and SVG-RCA system totally occluded.  There  are collaterals from the LAD system to the RCA and the RCA is totally occluded proximally.  There are no collaterals to the LCx territory.  He then underwent successful PCI of the mid left circumflex artery with overlapping Synergy drug-eluting stents   Depression    pt denies   Diabetes mellitus type II dx'd in the 1990's   GERD (gastroesophageal reflux disease)    yrs ago   H/O hiatal hernia    Hemorrhoids    History of hypogonadism    History of kidney stones    Hyperlipidemia    Hypotension    OSA on CPAP    "mask is broken; working on getting a new one" (01/15/2015; 10/03/2017)   Pneumonia    "3-4 times" (10/03/2017)   Urinary incontinence     Past Surgical History:  Procedure Laterality Date   A-FLUTTER ABLATION N/A 09/12/2017   Procedure: A-FLUTTER ABLATION;  Surgeon: Deboraha Sprang, MD;  Location: [facility];  Service: Cardiovascular;  Laterality: N/A;   CARDIAC DEFIBRILLATOR PLACEMENT  01/15/2015   CARDIOVERSION N/A 08/10/2017   Procedure: CARDIOVERSION;  Surgeon: Larey Dresser, MD;  [facility]: [facility] ENDOSCOPY;  Service: Cardiovascular;  Laterality: N/A;   CORONARY ANGIOPLASTY WITH STENT PLACEMENT  ~ 2003   CORONARY ARTERY BYPASS GRAFT N/A 08/15/2012   Procedure: CORONARY ARTERY BYPASS GRAFTING (CABG);  Surgeon: Ivin Poot, MD;  Location: [facility];  Service: Open Heart Surgery;  Laterality: N/A;  Coronary Artery Bypass Grafting Times Four Using Left Internal Mammary Artery and Right Saphenous Leg Vein Harvested Endoscopically   EP IMPLANTABLE DEVICE N/A 01/15/2015   Procedure: ICD Implant;  Surgeon: Deboraha Sprang, MD;  Location: [facility];  Service: Cardiovascular;  Laterality: N/A;   LEFT HEART CATHETERIZATION WITH CORONARY  ANGIOGRAM N/A 08/08/2012   Procedure: LEFT HEART CATHETERIZATION WITH CORONARY ANGIOGRAM;  Surgeon: Peter M Martinique, MD;  Location: [facility] CATH LAB;  Service: Cardiovascular;  Laterality: N/A;   LEFT HEART CATHETERIZATION WITH CORONARY/GRAFT ANGIOGRAM N/A 07/21/2014   Procedure: LEFT HEART CATHETERIZATION WITH Beatrix Fetters;  Surgeon: Larey Dresser, MD;  Location: [facility] CATH LAB;  Service: Cardiovascular;  Laterality: N/A;   MULTIPLE EXTRACTIONS WITH ALVEOLOPLASTY N/A 10/04/2017   Procedure: Extraction of tooth #'s 5 and 14 with alveoloplasty and gross debridement of remaining teeth;  Surgeon: [name], DDS;  Location: [place];  Service: Oral Surgery;  Laterality: N/A;   PERCUTANEOUS CORONARY STENT INTERVENTION (PCI-S)  07/21/2014   Procedure: PERCUTANEOUS CORONARY STENT INTERVENTION (PCI-S);  Surgeon: Larey Dresser, MD;  Location: [facility] CATH LAB;  Service: Cardiovascular;;   REFRACTIVE SURGERY Bilateral 1990's   RIGHT HEART CATH N/A 10/03/2017   Procedure: RIGHT HEART CATH;  Surgeon: Larey Dresser, MD;  Location: [facility];  Service: Cardiovascular;  Laterality: N/A;   RIGHT HEART CATH N/A 12/11/2018   Procedure: RIGHT HEART CATH;  Surgeon: Larey Dresser, MD;  Location: [place] [facility];  Service: Cardiovascular;  Laterality: N/A;   RIGHT/LEFT HEART CATH AND CORONARY/GRAFT ANGIOGRAPHY N/A 07/29/2020   Procedure: RIGHT/LEFT HEART CATH AND CORONARY/GRAFT ANGIOGRAPHY;  Surgeon: Larey Dresser, MD;  Location: [facility];  Service: Cardiovascular;  Laterality: N/A;   TEE WITHOUT CARDIOVERSION N/A 08/10/2017   Procedure: TRANSESOPHAGEAL ECHOCARDIOGRAM (TEE);  Surgeon: Larey Dresser, MD;  Location: [facility] ENDOSCOPY;  Service: Cardiovascular;  Laterality: N/A;   TONSILLECTOMY  ~ [place] Bilateral 11/28/2017   Procedure: VIDEO BRONCHOSCOPY WITHOUT FLUORO;  Surgeon: Brand Males, MD;  Location: [facility];  Service: Endoscopy;  Laterality: Bilateral;     Family Hx:  Family History  Problem Relation Age of Onset   Hypertension Mother    Diabetes Mother    Hypertension Father    Diabetes Father    Hyperlipidemia Father     Social History:  reports that he has never smoked. His smokeless tobacco use includes chew. He reports current alcohol use of about 3.0 standard drinks per week. He reports that he does not use drugs.  Allergies: No Known Allergies  Medications: Prior to Admission medications   Medication Sig Start Date End Date Taking? Authorizing Provider  ACCU-CHEK FASTCLIX LANCETS MISC Use as directed once a day.  Dx code: E11.9 09/19/17   [name], Alferd Apa, DO  albuterol (ACCUNEB) 0.63 MG/3ML nebulizer solution Take 3 mLs (0.63 mg total) by nebulization every 6 (six) hours as needed for wheezing. 12/04/18   Fenton Foy, NP  albuterol (VENTOLIN HFA) 108 (90 Base) MCG/ACT inhaler Inhale 2 puffs into the lungs every 6 (six) hours as needed for wheezing or shortness of breath.     [provider]  allopurinol (ZYLOPRIM) 300 MG tablet Take 1 tablet (300 mg total) by mouth daily. 03/28/21   Silverio Decamp, MD  apixaban (ELIQUIS) 5 MG TABS tablet TAKE 1 TABLET BY MOUTH 2 TIMES DAILY. 08/03/20 08/03/21  Larey Dresser, MD  atorvastatin (LIPITOR) 40 MG tablet Take 1 tablet (40 mg total) by mouth at bedtime. 02/22/21 02/22/22  Rafael Bihari, FNP  blood glucose meter kit and supplies Dispense based on patient and insurance preference. Use as directed once a day. Dx Code E11.9. 09/06/17   [name], Alferd Apa, DO  Blood Glucose Monitoring Suppl (ACCU-CHEK GUIDE) w/Device KIT 1 each by Does not apply route daily. DX Code: E11.9 09/19/17   [name], Alferd Apa, DO  carvedilol (COREG) 3.125 MG tablet Take 1 tablet (3.125 mg total) by mouth 2 (two) times daily. 07/27/20   Larey Dresser, MD  fluticasone-salmeterol (ADVAIR HFA) [phone] MCG/ACT inhaler Inhale 2 puffs into the lungs 2 (two) times daily. 11/26/20   Martyn Ehrich, NP  glucose blood (ACCU-CHEK GUIDE) test strip Use as directed once a day.  Dx code: E11.9 09/19/17   [name], Alferd Apa, DO  guaiFENesin (MUCINEX) 600 MG 12 hr tablet Take 1 tablet (600 mg total) by mouth 2 (two) times daily as needed for to loosen phlegm. 08/17/17   Shirley Friar, PA-C  metFORMIN (GLUCOPHAGE) 500 MG tablet Take 1 tablet (500 mg total) by mouth 2 (two) times daily with a meal. 10/20/20   Shamleffer, Melanie Crazier, MD  nitroGLYCERIN (NITROSTAT) 0.4 MG SL tablet Place 1 tablet (0.4 mg total) under the tongue every 5 (five) minutes as needed for chest pain. 07/21/20   Larey Dresser, MD  OXYGEN Inhale 3 L into the lungs daily as needed (low oxygen).    [provider]  Pirfenidone (ESBRIET) 267 MG TABS Take 1 tab three times daily for 7 days, then 2 tabs three times daily for 7 days, then 3 tabs three times daily thereafter. 03/02/21   Brand Males, MD  predniSONE (DELTASONE) 10 MG tablet TAKE 1 TABLET (10 MG TOTAL) BY MOUTH DAILY WITH BREAKFAST. 02/23/21   Larey Dresser, MD  sacubitril-valsartan (ENTRESTO) 24-26 MG TAKE 1 TABLET BY MOUTH 2 (TWO) TIMES DAILY. 04/06/20 04/30/21  Larey Dresser, MD  spironolactone (ALDACTONE) 25  MG tablet Take 0.5 tablets (12.5 mg total) by mouth daily. 02/22/21 05/23/21  Rafael Bihari, FNP  torsemide 40 MG TABS Take 40 mg by mouth daily. 03/16/21   Rafael Bihari, FNP    I have reviewed the patient's current medications.  Labs:  Results for orders placed or performed during the hospital encounter of 04/12/2021 (from the past 48 hour(s))  Type and screen [place]     Status: None   Collection Time: 04/10/2021  7:00 PM  Result Value Ref Range   ABO/RH(D) A POS    Antibody Screen NEG    Sample Expiration      04/07/2021,2359 Performed at [facility], [place] [address] [place], [place] [address]   CBC with Differential     Status: Abnormal   Collection Time: 04/26/2021  7:18 PM   Result Value Ref Range   WBC 6.5 4.0 - 10.5 K/uL   RBC 2.86 (L) 4.22 - 5.81 MIL/uL   Hemoglobin 11.5 (L) 13.0 - 17.0 g/dL   HCT 36.6 (L) 39.0 - 52.0 %   MCV 128.0 (H) 80.0 - 100.0 fL   MCH 40.2 (H) 26.0 - 34.0 pg   MCHC 31.4 30.0 - 36.0 g/dL   RDW 17.4 (H) 11.5 - 15.5 %   Platelets PLATELET CLUMPS NOTED ON SMEAR, UNABLE TO ESTIMATE 150 - 400 K/uL    Comment: Immature Platelet Fraction may be clinically indicated, consider ordering this additional test [id number] REPEATED TO VERIFY    nRBC 2.6 (H) 0.0 - 0.2 %   Neutrophils Relative % 75 %   Neutro Abs 4.9 1.7 - 7.7 K/uL   Lymphocytes Relative 11 %   Lymphs Abs 0.7 0.7 - 4.0 K/uL   Monocytes Relative 12 %   Monocytes Absolute 0.8 0.1 - 1.0 K/uL   Eosinophils Relative 1 %   Eosinophils Absolute 0.0 0.0 - 0.5 K/uL   Basophils Relative 0 %   Basophils Absolute 0.0 0.0 - 0.1 K/uL   WBC Morphology MORPHOLOGY UNREMARKABLE    RBC Morphology MORPHOLOGY UNREMARKABLE    Smear Review PLATELET CLUMPS NOTED ON SMEAR, UNABLE TO ESTIMATE    Immature Granulocytes 1 %   Abs Immature Granulocytes 0.04 0.00 - 0.07 K/uL    Comment: Performed at [facility], [place] [address], [place], [place] [address]  POC occult blood, ED     Status: Abnormal   Collection Time: 04/06/2021  7:27 PM  Result Value Ref Range   Fecal Occult Bld POSITIVE (A) NEGATIVE  I-stat chem 8, ED (not at [facility] or [facility])     Status: Abnormal   Collection Time: 04/09/2021  7:29 PM  Result Value Ref Range   Sodium 127 (L) 135 - 145 mmol/L   Potassium 8.5 (HH) 3.5 - 5.1 mmol/L   Chloride 105 98 - 111 mmol/L   BUN >130 (H) 6 - 20 mg/dL   Creatinine, Ser 5.70 (H) 0.61 - 1.24 mg/dL   Glucose, Bld 101 (H) 70 - 99 mg/dL    Comment: Glucose reference range applies only to samples taken after fasting for at least 8 hours.   Calcium, Ion 0.86 (LL) 1.15 - 1.40 mmol/L   TCO2 16 (L) 22 - 32 mmol/L   Hemoglobin 12.6 (L) 13.0 - 17.0 g/dL   HCT 37.0 (L) 39.0 - 52.0 %   Comment NOTIFIED  PHYSICIAN   Lactic acid, plasma     Status: Abnormal   Collection Time: 05/01/2021  7:44  PM  Result Value Ref Range   Lactic Acid, Venous 4.6 (HH) 0.5 - 1.9 mmol/L    Comment: CRITICAL RESULT CALLED TO, READ BACK BY AND VERIFIED WITH: A PATELEDO,RN 2106 04/05/2019 WBOND Performed at [facility], [place] [address], [place], [place] [address]   Basic metabolic panel     Status: Abnormal   Collection Time: 04/20/2021  8:22 PM  Result Value Ref Range   Sodium 139 135 - 145 mmol/L    Comment: DELTA CHECK NOTED   Potassium 3.6 3.5 - 5.1 mmol/L    Comment: DELTA CHECK NOTED   Chloride 121 (H) 98 - 111 mmol/L   CO2 8 (L) 22 - 32 mmol/L   Glucose, Bld 76 70 - 99 mg/dL    Comment: Glucose reference range applies only to samples taken after fasting for at least 8 hours.   BUN 68 (H) 6 - 20 mg/dL   Creatinine, Ser 3.01 (H) 0.61 - 1.24 mg/dL    Comment: DELTA CHECK NOTED   Calcium 4.8 (LL) 8.9 - 10.3 mg/dL    Comment: CRITICAL RESULT CALLED TO, READ BACK BY AND VERIFIED WITH: DR Shari Prows 04/20/2021 2146 M KOROLESKI    GFR, Estimated 23 (L) >60 mL/min    Comment: (NOTE) Calculated using the CKD-EPI Creatinine Equation (2021)    Anion gap 10 5 - 15    Comment: Performed at [facility] [address] [place], [place] [address]  Hepatic function panel     Status: Abnormal   Collection Time: 04/08/2021  8:22 PM  Result Value Ref Range   Total Protein 3.3 (L) 6.5 - 8.1 g/dL   Albumin 1.7 (L) 3.5 - 5.0 g/dL   AST 22 15 - 41 U/L   ALT 12 0 - 44 U/L   Alkaline Phosphatase 38 38 - 126 U/L   Total Bilirubin 0.4 0.3 - 1.2 mg/dL   Bilirubin, Direct 0.2 0.0 - 0.2 mg/dL   Indirect Bilirubin 0.2 (L) 0.3 - 0.9 mg/dL    Comment: Performed at [facility] [address], [place], [place] [address]  Magnesium     Status: Abnormal   Collection Time: 04/13/2021  8:22 PM  Result Value Ref Range   Magnesium 1.2 (L) 1.7 - 2.4 mg/dL    Comment: Performed at [facility] [address] [place], [place] [address]  CBG monitoring, ED     Status: Abnormal   Collection Time: 04/24/2021  9:43 PM  Result Value Ref Range   Glucose-Capillary 104 (H) 70 - 99 mg/dL    Comment: Glucose reference range applies only to samples taken after fasting for at least 8 hours.     ROS:  Review of systems not obtained due to patient factors.  Has baseline SOB-  weakness in legs  Physical Exam: Vitals:   04/21/2021 1913 04/12/2021 1945  BP: (!) 82/66 (!) 85/67  Pulse: (!) 107 (!) 102  Resp: 17 16  Temp: (!) 96.7 F (35.9 C)   SpO2: 100% 100%     General: alert, conversive HEENT:  PERRLA, EOMI Neck: no JVD Heart: tachy Lungs: poor air movement bilat-  no crackles Abdomen: soft,non tender Extremities: mottled-  trace to 1+ pittng edema on thighs Skin: pale, mottled-  some cyanotic changes to fingers Neuro: alert, globally weak but nothing focal   Assessment/Plan:  60 year old WM with cardiomyopathy /ILD and CKD presents with syncope/hypotension with A on CRF complicated by hyperkalemia 1.Renal-  baseline stage 3 CKD likely cardiorenal in the setting of cardiomyopathy.  Now presents with A on CRF in the setting of hypotension of unclear etiology and more concerning significant hyperkalemia.  For now, trying to treat medically with fluid boluses and calcium/insulin /bicarb and lokelma along with holding his ARB and aldactone to see if we can get this heading in the right direction without needing something like dialysis.  I did warn him that dialysis was a possibility however.  If needed would need CRRT.  Will watch potassium closely through the night 2. Hypertension/volume /cardiomyopathy - has a baseline O2 requirement due to his ILD-  CXR does not look like edema-  will try to give some fluid back gently to support BP-  CCM on board-  low threshold for pressors and or inotropes also to support BP.  3. Hyperkalemia -  was 8.5 via ISTAT-  BMP was 3.6-  I am not sure it is that low.  ICD/pacer is  offering [place] some protection from arrythmia-  giving calcium/insulin/bicarb/albuterol and lokelma-  will follow closely-  checking again at 11 PM -  would be the indication to do dialysis /CRRT 4. Anemia  - not significant 5. Hypocalcemia-  corrected is better but still low-  got a gram of calcium gluconate-  will repeat and follow   Louis Meckel 04/12/2021, 8:22 PM

## 2021-04-04 NOTE — H&P (Addendum)
NAME:  JOZEPH PERSING, MRN:  [id number], DOB:  03/30/1961, LOS: 0 ADMISSION DATE:  04/16/2021, CONSULTATION DATE:  04/23/2021 REFERRING MD:  EDP, CHIEF COMPLAINT:  Syncopal episode    History of Present Illness:  Fitzroy Mikami is a 60 year old male with past medical history significant for HFrEF, cardiomyopathy, AICD, atrial flutter on Eliquis, CAD, ILD on 3 L home O2 and OSA on CPAP, type 2 diabetes, GERD and hiatal hernia who presented to the ED after a syncopal episode while walking to the bathroom, sat down in his bed and then woke up on the floor.  He reports several weeks of lower extremity weakness and generally not feeling well with dizziness over the last several days along with dark stools.  He also reports nausea and constipation, denies any chest pain, palpitations, fevers, abdominal pain, diarrhea, new cough or URI symptoms.  On EMS arrival, patient was awake and alert though hypotensive.  He was given 500 cc IV fluid bolus.  In the ED, head and C-spine CTs were negative for acute findings, CXR without any infiltrate.  I-STAT was significant for potassium of 8.5, creatinine 5.7, hemoglobin 12.   He was given additional 500 cc LR and nephrology was consulted.  He has been urinating, reports decreased p.o. intake over several days.  He was given insulin, calcium, albuterol, and Lokelma and additional IV fluid ordered.  Lactic acid was 4.  Lab run BMP is pending.  PCCM consulted in the setting.   Pertinent  Medical History   has a past medical history of AICD (automatic cardioverter/defibrillator) present, Anginal pain ([place]), Anxiety, Asthma, Atrial fibrillation-postoperative (11/28/2012), Cardiomyopathy, CHF (congestive heart failure) ([place]), Chronic back pain, Coronary artery disease, Depression, Diabetes mellitus type II (dx'd in the 1990's), GERD (gastroesophageal reflux disease), H/O hiatal hernia, Hemorrhoids, History of hypogonadism, History of kidney stones, Hyperlipidemia, Hypotension,  OSA on CPAP, Pneumonia, and Urinary incontinence.   Significant Hospital Events: Including procedures, antibiotic start and stop dates in addition to other pertinent events   10/3-presented to ED with hyperkalemia and hypotension, PCCM admit  Interim History / Subjective:  Blood pressure borderline low, though patient mentating well and MAP >65  Objective   Blood pressure 105/83, pulse (!) 107, temperature (!) 96.7 F (35.9 C), temperature source Rectal, resp. rate 20, height 5' 9" (1.753 m), weight 71.2 kg, SpO2 100 %.        Intake/Output Summary (Last 24 hours) at 04/16/2021 2122 Last data filed at 04/27/2021 2009 Gross per 24 hour  Intake 500 ml  Output --  Net 500 ml   Filed Weights   04/22/2021 1916  Weight: 71.2 kg    General: Elderly, chronically ill-appearing male, awake in no acute distress HEENT: MM pink/moist, abrasion on the left aspect frontal forehead without hematoma Neuro: Awake, oriented x3, generally weak in bilateral lower extremities without obvious focal deficits CV: s1s2 RRR, no m/r/g PULM: On nasal cannula oxygen, no wheezing or rhonchi, good air movement bilaterally without accessory muscle use GI: soft, bsx4 active, abrasion to the left lower abdomen, no abdominal tenderness to palpation Extremities: Cold/dry, no edema  Skin: no rashes or lesions   Resolved Hospital Problem list     Assessment & Plan:     Syncopal episode History of a flutter on Eliquis Cardiomyopathy, HFrEF, AICD Initial head CT negative for acute findings, suspect syncope may have been related to hypotension/volume depletion Follows with Dr. [name] Last right/left heart cath 07/2020 with new occluded mid LCX On Entresto, Aldactone, torsemide,  carvedilol P: -Follow troponin, EKG not suggestive of new ischemia -Monitor neuro status to monitor for delayed bleed in the setting of head trauma on DOAC -Consult advanced heart failure in the daytime -Hold Eliquis for  now    Acute on chronic renal failure with hyperkalemia, prolonged Qtc Baseline creatinine 1.5-2 over the last several months Notes decreased p.o. intake over the last several days secondary to nausea Qtc 507 P: -Seen by nephrology, appreciate recommendations -Patient given insulin, albuterol, bicarb, and Lokelma along with additional fluids -Monitor urine output and serial metabolic panels, may require dialysis, but is currently making urine -Avoid nephrotoxins, hold torsemide -avoid Qtc prolonging medications   GI bleed Patient has noted melanotic stools for several days, he denies any history of ulcers, no abdominal pain P: -Initial hemoglobin 12, monitor CBC, may need GI consult -Protonix 40 mg twice daily -Hold Eliquis -NPO   Hypotension, lactic acidosis Blood pressure improved with IV fluid, lactic acid 4 P: -Patient denies infectious symptoms, however continue empiric ceftriaxone in the setting of likely UGI bleed and history of EtOH, consider variceal bleed though currently low suspicion -On chronic prednisone 10 mg, may need stress dose steroids -Currently not requiring vasopressors  Chronic hypoxic respiratory failure, ILD On chronic prednisone and pirfenidone Follows with [name] P: -Continue home medications and nasal cannula -CPAP nightly   Type 2 diabetes mellitus -Hold metformin, SSI     Best Practice (right click and "Reselect all SmartList Selections" daily)   Diet/type: NPO DVT prophylaxis: SCD GI prophylaxis: PPI Lines: N/A Foley:  N/A Code Status:  full code Last date of multidisciplinary goals of care discussion:  Labs   CBC: Recent Labs  Lab 04/05/2021 1918 04/27/2021 1929  WBC 6.5  --   NEUTROABS 4.9  --   HGB 11.5* 12.6*  HCT 36.6* 37.0*  MCV 128.0*  --   PLT PLATELET CLUMPS NOTED ON SMEAR, UNABLE TO ESTIMATE  --     Basic Metabolic Panel: Recent Labs  Lab 04/25/2021 1929  NA 127*  K 8.5*  CL 105  GLUCOSE 101*  BUN  >130*  CREATININE 5.70*   GFR: Estimated Creatinine Clearance: 14 mL/min (A) (by C-G formula based on SCr of 5.7 mg/dL (H)). Recent Labs  Lab 04/05/2021 1918 04/06/2021 1944  WBC 6.5  --   LATICACIDVEN  --  4.6*    Liver Function Tests: No results for input(s): AST, ALT, ALKPHOS, BILITOT, PROT, ALBUMIN in the last 168 hours. No results for input(s): LIPASE, AMYLASE in the last 168 hours. No results for input(s): AMMONIA in the last 168 hours.  ABG    Component Value Date/Time   PHART 7.392 07/24/2018 0722   PCO2ART 34.3 07/24/2018 0722   PO2ART 94.7 07/24/2018 0722   HCO3 23.2 07/29/2020 1154   HCO3 22.5 07/29/2020 1154   TCO2 16 (L) 04/13/2021 1929   ACIDBASEDEF 3.0 (H) 07/29/2020 1154   ACIDBASEDEF 4.0 (H) 07/29/2020 1154   O2SAT 71.0 07/29/2020 1154   O2SAT 71.0 07/29/2020 1154     Coagulation Profile: No results for input(s): INR, PROTIME in the last 168 hours.  Cardiac Enzymes: No results for input(s): CKTOTAL, CKMB, CKMBINDEX, TROPONINI in the last 168 hours.  HbA1C: Hemoglobin A1C  Date/Time Value Ref Range Status  10/09/2019 08:17 AM 6.9 (A) 4.0 - 5.6 % Final   Hgb A1c MFr Bld  Date/Time Value Ref Range Status  06/14/2020 02:02 PM 6.8 (H) 4.6 - 6.5 % Final    Comment:  Glycemic Control Guidelines for People with Diabetes:Non Diabetic:  <6%Goal of Therapy: <7%Additional Action Suggested:  >8%   02/07/2019 04:06 PM 11.2 (H) <5.7 % of total Hgb Final    Comment:    For someone without known diabetes, a hemoglobin A1c value of 6.5% or greater indicates that they may have  diabetes and this should be confirmed with a follow-up  test. . For someone with known diabetes, a value <7% indicates  that their diabetes is well controlled and a value  greater than or equal to 7% indicates suboptimal  control. A1c targets should be individualized based on  duration of diabetes, age, comorbid conditions, and  other considerations. . Currently, no consensus exists  regarding use of hemoglobin A1c for diagnosis of diabetes for children. .     CBG: No results for input(s): GLUCAP in the last 168 hours.  Review of Systems:   Review of Systems  Constitutional:  Positive for malaise/fatigue. Negative for chills, fever and weight loss.  Respiratory:  Positive for cough. Negative for sputum production, shortness of breath and wheezing.   Cardiovascular:  Negative for chest pain, palpitations and leg swelling.  Gastrointestinal:  Positive for constipation, melena and nausea. Negative for abdominal pain, blood in stool, diarrhea, heartburn and vomiting.  Genitourinary: Negative.   Musculoskeletal:  Positive for falls.  Neurological:  Positive for dizziness, loss of consciousness and weakness. Negative for focal weakness and headaches.    Past Medical History:  He,  has a past medical history of AICD (automatic cardioverter/defibrillator) present, Anginal pain ([place]), Anxiety, Asthma, Atrial fibrillation-postoperative (11/28/2012), Cardiomyopathy, CHF (congestive heart failure) ([place]), Chronic back pain, Coronary artery disease, Depression, Diabetes mellitus type II (dx'd in the 1990's), GERD (gastroesophageal reflux disease), H/O hiatal hernia, Hemorrhoids, History of hypogonadism, History of kidney stones, Hyperlipidemia, Hypotension, OSA on CPAP, Pneumonia, and Urinary incontinence.   Surgical History:   Past Surgical History:  Procedure Laterality Date   A-FLUTTER ABLATION N/A 09/12/2017   Procedure: A-FLUTTER ABLATION;  Surgeon: Deboraha Sprang, MD;  Location: [facility];  Service: Cardiovascular;  Laterality: N/A;   CARDIAC DEFIBRILLATOR PLACEMENT  01/15/2015   CARDIOVERSION N/A 08/10/2017   Procedure: CARDIOVERSION;  Surgeon: Larey Dresser, MD;  Location: [facility] ENDOSCOPY;  Service: Cardiovascular;  Laterality: N/A;   CORONARY ANGIOPLASTY WITH STENT PLACEMENT  ~ 2003   CORONARY ARTERY BYPASS GRAFT N/A 08/15/2012   Procedure: CORONARY ARTERY  BYPASS GRAFTING (CABG);  Surgeon: Ivin Poot, MD;  Location: [facility];  Service: Open Heart Surgery;  Laterality: N/A;  Coronary Artery Bypass Grafting Times Four Using Left Internal Mammary Artery and Right Saphenous Leg Vein Harvested Endoscopically   EP IMPLANTABLE DEVICE N/A 01/15/2015   Procedure: ICD Implant;  Surgeon: Deboraha Sprang, MD;  Location: [place];  Service: Cardiovascular;  Laterality: N/A;   LEFT HEART CATHETERIZATION WITH CORONARY ANGIOGRAM N/A 08/08/2012   Procedure: LEFT HEART CATHETERIZATION WITH CORONARY ANGIOGRAM;  Surgeon: Peter M Martinique, MD;  Location: [facility] C[facility] LAB;  Service: Cardiovascular;  Laterality: N/A;   LEFT HEART CATHETERIZATION WITH CORONARY/GRAFT ANGIOGRAM N/A 07/21/2014   Procedure: LEFT HEART CATHETERIZATION WITH Beatrix Fetters;  Surgeon: Larey Dresser, MD;  Location: [facility] CATH LAB;  Service: Cardiovascular;  Laterality: N/A;   MULTIPLE EXTRACTIONS WITH ALVEOLOPLASTY N/A 10/04/2017   Procedure: Extraction of tooth #'s 5 and 14 with alveoloplasty and gross debridement of remaining teeth;  Surgeon: [name], DDS;  Location: [place];  Service: Oral Surgery;  Laterality: N/A;  PERCUTANEOUS CORONARY STENT INTERVENTION (PCI-S)  07/21/2014   Procedure: PERCUTANEOUS CORONARY STENT INTERVENTION (PCI-S);  Surgeon: Larey Dresser, MD;  Location: [facility] CATH LAB;  Service: Cardiovascular;;   REFRACTIVE SURGERY Bilateral 1990's   RIGHT HEART CATH N/A 10/03/2017   Procedure: RIGHT HEART CATH;  Surgeon: Larey Dresser, MD;  Location: [place];  Service: Cardiovascular;  Laterality: N/A;   RIGHT HEART CATH N/A 12/11/2018   Procedure: RIGHT HEART CATH;  Surgeon: Larey Dresser, MD;  Location: [place] [facility];  Service: Cardiovascular;  Laterality: N/A;   RIGHT/LEFT HEART CATH AND CORONARY/GRAFT ANGIOGRAPHY N/A 07/29/2020   Procedure: RIGHT/LEFT HEART CATH AND CORONARY/GRAFT ANGIOGRAPHY;  Surgeon: Larey Dresser, MD;  Location: [place] [facility];  Service: Cardiovascular;  Laterality: N/A;   TEE WITHOUT CARDIOVERSION N/A 08/10/2017   Procedure: TRANSESOPHAGEAL ECHOCARDIOGRAM (TEE);  Surgeon: Larey Dresser, MD;  Location: [facility] ENDOSCOPY;  Service: Cardiovascular;  Laterality: N/A;   TONSILLECTOMY  ~ [place] Bilateral 11/28/2017   Procedure: VIDEO BRONCHOSCOPY WITHOUT FLUORO;  Surgeon: Brand Males, MD;  Location: [facility] ENDOSCOPY;  Service: Endoscopy;  Laterality: Bilateral;     Social History:   reports that he has never smoked. His smokeless tobacco use includes chew. He reports current alcohol use of about 3.0 standard drinks per week. He reports that he does not use drugs.   Family History:  His family history includes Diabetes in his father and mother; Hyperlipidemia in his father; Hypertension in his father and mother.   Allergies No Known Allergies   Home Medications  Prior to Admission medications   Medication Sig Start Date End Date Taking? Authorizing Provider  ACCU-CHEK FASTCLIX LANCETS MISC Use as directed once a day.  Dx code: E11.9 09/19/17   [name], Alferd Apa, DO  albuterol (ACCUNEB) 0.63 MG/3ML nebulizer solution Take 3 mLs (0.63 mg total) by nebulization every 6 (six) hours as needed for wheezing. 12/04/18   Fenton Foy, NP  albuterol (VENTOLIN HFA) 108 (90 Base) MCG/ACT inhaler Inhale 2 puffs into the lungs every 6 (six) hours as needed for wheezing or shortness of breath.     [provider]  allopurinol (ZYLOPRIM) 300 MG tablet Take 1 tablet (300 mg total) by mouth daily. 03/28/21   Silverio Decamp, MD  apixaban (ELIQUIS) 5 MG TABS tablet TAKE 1 TABLET BY MOUTH 2 TIMES DAILY. 08/03/20 08/03/21  Larey Dresser, MD  atorvastatin (LIPITOR) 40 MG tablet Take 1 tablet (40 mg total) by mouth at bedtime. 02/22/21 02/22/22  Rafael Bihari, FNP  blood glucose meter kit and supplies Dispense based on patient and insurance preference. Use as directed once a day. Dx Code E11.9.  09/06/17   [name], Alferd Apa, DO  Blood Glucose Monitoring Suppl (ACCU-CHEK GUIDE) w/Device KIT 1 each by Does not apply route daily. DX Code: E11.9 09/19/17   [name], Alferd Apa, DO  carvedilol (COREG) 3.125 MG tablet Take 1 tablet (3.125 mg total) by mouth 2 (two) times daily. 07/27/20   Larey Dresser, MD  fluticasone-salmeterol (ADVAIR HFA) [phone] MCG/ACT inhaler Inhale 2 puffs into the lungs 2 (two) times daily. 11/26/20   Martyn Ehrich, NP  glucose blood (ACCU-CHEK GUIDE) test strip Use as directed once a day.  Dx code: E11.9 09/19/17   [name], Alferd Apa, DO  guaiFENesin (MUCINEX) 600 MG 12 hr tablet Take 1 tablet (600 mg total) by mouth 2 (two) times daily as needed for  to loosen phlegm. 08/17/17   Shirley Friar, PA-C  metFORMIN (GLUCOPHAGE) 500 MG tablet Take 1 tablet (500 mg total) by mouth 2 (two) times daily with a meal. 10/20/20   Shamleffer, Melanie Crazier, MD  nitroGLYCERIN (NITROSTAT) 0.4 MG SL tablet Place 1 tablet (0.4 mg total) under the tongue every 5 (five) minutes as needed for chest pain. 07/21/20   Larey Dresser, MD  OXYGEN Inhale 3 L into the lungs daily as needed (low oxygen).    [provider]  Pirfenidone (ESBRIET) 267 MG TABS Take 1 tab three times daily for 7 days, then 2 tabs three times daily for 7 days, then 3 tabs three times daily thereafter. 03/02/21   Brand Males, MD  predniSONE (DELTASONE) 10 MG tablet TAKE 1 TABLET (10 MG TOTAL) BY MOUTH DAILY WITH BREAKFAST. 02/23/21   Larey Dresser, MD  sacubitril-valsartan (ENTRESTO) 24-26 MG TAKE 1 TABLET BY MOUTH 2 (TWO) TIMES DAILY. 04/06/20 04/30/21  Larey Dresser, MD  spironolactone (ALDACTONE) 25 MG tablet Take 0.5 tablets (12.5 mg total) by mouth daily. 02/22/21 05/23/21  Rafael Bihari, FNP  torsemide 40 MG TABS Take 40 mg by mouth daily. 03/16/21   Rafael Bihari, FNP     Critical care time: 45 minutes    CRITICAL CARE Performed by: Otilio Carpen Jyren Cerasoli   Total  critical care time: 45 minutes  Critical care time was exclusive of separately billable procedures and treating other patients.  Critical care was necessary to treat or prevent imminent or life-threatening deterioration.  Critical care was time spent personally by me on the following activities: development of treatment plan with patient and/or surrogate as well as nursing, discussions with consultants, evaluation of patient's response to treatment, examination of patient, obtaining history from patient or surrogate, ordering and performing treatments and interventions, ordering and review of laboratory studies, ordering and review of radiographic studies, pulse oximetry and re-evaluation of patient's condition.  Otilio Carpen Manfred Laspina, PA-C [facility] See Amion for pager If no response to pager , please call [phone] until 7pm After 7:00 pm call Elink  [phone]

## 2021-04-04 NOTE — ED Notes (Signed)
Report given to [facility], Therapist, sports. RN asked me to place pt on NRB to go up to ICU since he's sitting about 90% on the 6L [place]. Pt taken up to ICU via RN via stretcher on monitor.

## 2021-04-04 NOTE — ED Notes (Signed)
Pt was given 500 cc NS and 4 mg zofran by EMS.

## 2021-04-04 NOTE — ED Notes (Signed)
Pt came back, I was able to get BMP and pt then went to CT.

## 2021-04-04 NOTE — ED Triage Notes (Signed)
Pt here with weakness x2 days and had syncopal episode today from sitting down. On eliquis. Hit L side FA. Was mottled when EMS arrived. Pt has had dark tarry stool over last 2 days also.

## 2021-04-04 NOTE — ED Notes (Signed)
Placed on NRB prior to transport.

## 2021-04-04 NOTE — Progress Notes (Addendum)
eLink Physician-Brief Progress Note Patient Name: Vincent Chandler DOB: 17-Oct-1960 MRN: [id number]   Date of Service  05/02/2021  HPI/Events of Note  60/M with hx of cardiomyopathy EF 20-25%, atrial flutter on Eliquis,  OSA on CPAP, IPF being treated with antifibrotic's presenting with syncope, weakness and dark stools.  Pt was noted to be hypotensive and tachycardic.    Labs revealed hyperkalemia with K 8.5, crea 5.7. PT given lokelma, insulin, calcium and IVFs with improvement in K.  On camera assessment, pt is drowsy but wakes up to name calling.  BP 105/75, HR 111, RR 17.  eICU Interventions  Syncope Hypotension AKI on CKD Hyperkalemia Melena  Continue IVFs.  Continue to monitor BMP and lactate. Add procalcitonin. Pt given empiric antibiotics.  Hold eliquis. Follow H&H.  Scds for DVT prophylaxis. Protonix 41m IV BID.       Intervention Category Evaluation Type: New Patient Evaluation  Vincent Lincoln10/08/2020, 11:51 PM  2:01 AM Notified of abnormal labs.   K 5.4 <-- 5.1 <-- 3.7 <-- 8.5 Crea at 5.12. Platelets at 99 Lactate improving 3.7 <-- 4.6. SBP in the 80s but with MAP >65.On recheck, SBP in the 70s. Pt has received atleast 1.5L IVFs and has been anuric.   Plan> Give another dose of lokelma now.  Start on levophed peripherally.   6:34 AM Pt is now on levophed at 140m with SBP in the 80s, MAP 70. Lactate is improving at 3.2 from 3.7.   Plan> Start on neosynephrine.  Informed ground team that patient will need central line placement.  Suspect possibility of cardiogenic shock.  Check 2decho.

## 2021-04-04 NOTE — ED Notes (Signed)
Patient transported to X-ray

## 2021-04-04 NOTE — ED Provider Notes (Signed)
[facility] EMERGENCY DEPARTMENT Provider Note   CSN: [id number] Arrival date & time: 04/30/2021  1858     History Chief Complaint  Patient presents with   Loss of Consciousness    Vincent Chandler is a 60 y.o. male.  with past medical history of automatic cardioverter/defibrillator insertion, cardiomyopathy, CHF, CAD, type 2 diabetes, hyperlipidemia, hypotension,who reports following a syncopal episode earlier in the day.  He states that he had gotten up to use the restroom and felt like he was going to pass out so he sat on his bed.  The next thing he remembers is waking up on the floor at which point he called EMS.  Per [facility], patient had persistent hypotension with lowest readings of SBP 63.  They have not been able to obtain adequate EKG.  He did receive 0.5 L bolus with initial BP response, however his pressures did decline again into the 70s.  On their arrival, patient extremities were mottled, cold to touch, with cyanosis in finger and toenail beds.  He endorses a similar episode 3 weeks ago, however he did not report to the ED at that time and medication changes were made per cardiology.  He endorses 3 weeks of dark stools.  He does endorse weakness over this time as well, making him feel concerned about getting up and walking.  He denies emesis, abdominal pain, does endorse nausea.       Past Medical History:  Diagnosis Date   AICD (automatic cardioverter/defibrillator) present    Anginal pain ([place])    Anxiety    Asthma    Atrial fibrillation-postoperative 11/28/2012   Cardiomyopathy    alcohol use related   CHF (congestive heart failure) (HCC)    Chronic back pain    Coronary artery disease    drug eluting stent RCA 2005-EF 30%- s/p CABG x 4; 2/4 patent grafts with SVG-PLOM and SVG-RCA system totally occluded.  There are collaterals from the LAD system to the RCA and the RCA is totally occluded proximally.  There are no collaterals to the LCx territory.  He  then underwent successful PCI of the mid left circumflex artery with overlapping Synergy drug-eluting stents   Depression    pt denies   Diabetes mellitus type II dx'd in the 1990's   GERD (gastroesophageal reflux disease)    yrs ago   H/O hiatal hernia    Hemorrhoids    History of hypogonadism    History of kidney stones    Hyperlipidemia    Hypotension    OSA on CPAP    "mask is broken; working on getting a new one" (01/15/2015; 10/03/2017)   Pneumonia    "3-4 times" (10/03/2017)   Urinary incontinence     Patient Active Problem List   Diagnosis Date Noted   Cellulitis of left lower extremity 12/30/2020   Weight loss 12/30/2020   DM (diabetes mellitus) type II, controlled, with peripheral vascular disorder ([place]) 12/23/2020   Gout 07/07/2020   Polyarthritis 07/06/2020   Olecranon bursitis of right elbow 06/14/2020   Type 2 diabetes mellitus with diabetic polyneuropathy, without long-term current use of insulin ([place]) 10/09/2019   Type 2 diabetes mellitus with stage 3b chronic kidney disease, without long-term current use of insulin ([place]) 10/09/2019   Diabetes mellitus ([place]) 10/09/2019   Chronic respiratory failure with hypoxia ([place]) 04/07/2019   Type 2 diabetes mellitus with stage 3 chronic kidney disease, without long-term current use of insulin ([place]) 02/26/2019   Uncontrolled type  2 diabetes mellitus with hyperglycemia ([place]) 02/11/2019   ILD (interstitial lung disease) ([place])    Chronic periodontitis 08/30/2017   Atrial flutter ([place]) 08/08/2017   Dyspnea 08/07/2017   Hyperglycemia 08/07/2017   CKD (chronic kidney disease) 2-3 07/19/2014   NSTEMI (non-ST elevated myocardial infarction) ([place]) 07/18/2014   OSA (obstructive sleep apnea) 07/18/2014   Tachycardia    V tach 07/17/2014   Hypoxemia 07/17/2014   CHF (congestive heart failure) ([place]) 06/04/2012   Sinus tachycardia 12/29/2010   Coronary artery disease prior RCA stent with new inferior Q waves 10/18/2010   Ischemic and  nonischemic cardiomyopathy   10/18/2010   Uncontrolled type 2 diabetes mellitus without complication, without long-term current use of insulin 03/10/2010   Hyperlipidemia 03/10/2010   Essential hypertension 03/10/2010    Past Surgical History:  Procedure Laterality Date   A-FLUTTER ABLATION N/A 09/12/2017   Procedure: A-FLUTTER ABLATION;  Surgeon: Deboraha Sprang, MD;  Location: [place] [facility];  Service: Cardiovascular;  Laterality: N/A;   CARDIAC DEFIBRILLATOR PLACEMENT  01/15/2015   CARDIOVERSION N/A 08/10/2017   Procedure: CARDIOVERSION;  Surgeon: Larey Dresser, MD;  Location: [facility] ENDOSCOPY;  Service: Cardiovascular;  Laterality: N/A;   CORONARY ANGIOPLASTY WITH STENT PLACEMENT  ~ 2003   CORONARY ARTERY BYPASS GRAFT N/A 08/15/2012   Procedure: CORONARY ARTERY BYPASS GRAFTING (CABG);  Surgeon: Ivin Poot, MD;  Location: [place];  Service: Open Heart Surgery;  Laterality: N/A;  Coronary Artery Bypass Grafting Times Four Using Left Internal Mammary Artery and Right Saphenous Leg Vein Harvested Endoscopically   EP IMPLANTABLE DEVICE N/A 01/15/2015   Procedure: ICD Implant;  Surgeon: Deboraha Sprang, MD;  Location: [place] [facility];  Service: Cardiovascular;  Laterality: N/A;   LEFT HEART CATHETERIZATION WITH CORONARY ANGIOGRAM N/A 08/08/2012   Procedure: LEFT HEART CATHETERIZATION WITH CORONARY ANGIOGRAM;  Surgeon: Peter M Martinique, MD;  Location: [facility] CATH LAB;  Service: Cardiovascular;  Laterality: N/A;   LEFT HEART CATHETERIZATION WITH CORONARY/GRAFT ANGIOGRAM N/A 07/21/2014   Procedure: LEFT HEART CATHETERIZATION WITH Beatrix Fetters;  Surgeon: Larey Dresser, MD;  Location: [facility] CATH LAB;  Service: Cardiovascular;  Laterality: N/A;   MULTIPLE EXTRACTIONS WITH ALVEOLOPLASTY N/A 10/04/2017   Procedure: Extraction of tooth #'s 5 and 14 with alveoloplasty and gross debridement of remaining teeth;  Surgeon: [name], DDS;  Location: [place];  Service: Oral Surgery;  Laterality: N/A;    PERCUTANEOUS CORONARY STENT INTERVENTION (PCI-S)  07/21/2014   Procedure: PERCUTANEOUS CORONARY STENT INTERVENTION (PCI-S);  Surgeon: Larey Dresser, MD;  Location: [facility] CATH LAB;  Service: Cardiovascular;;   REFRACTIVE SURGERY Bilateral 1990's   RIGHT HEART CATH N/A 10/03/2017   Procedure: RIGHT HEART CATH;  Surgeon: Larey Dresser, MD;  Location: [place] [facility];  Service: Cardiovascular;  Laterality: N/A;   RIGHT HEART CATH N/A 12/11/2018   Procedure: RIGHT HEART CATH;  Surgeon: Larey Dresser, MD;  Location: [place] [facility];  Service: Cardiovascular;  Laterality: N/A;   RIGHT/LEFT HEART CATH AND CORONARY/GRAFT ANGIOGRAPHY N/A 07/29/2020   Procedure: RIGHT/LEFT HEART CATH AND CORONARY/GRAFT ANGIOGRAPHY;  Surgeon: Larey Dresser, MD;  Location: [place] [facility];  Service: Cardiovascular;  Laterality: N/A;   TEE WITHOUT CARDIOVERSION N/A 08/10/2017   Procedure: TRANSESOPHAGEAL ECHOCARDIOGRAM (TEE);  Surgeon: Larey Dresser, MD;  Location: [facility] ENDOSCOPY;  Service: Cardiovascular;  Laterality: N/A;   TONSILLECTOMY  ~ [place] Bilateral 11/28/2017   Procedure: VIDEO BRONCHOSCOPY WITHOUT FLUORO;  Surgeon: Chase Caller,  Belva Crome, MD;  Location: [facility] ENDOSCOPY;  Service: Endoscopy;  Laterality: Bilateral;       Family History  Problem Relation Age of Onset   Hypertension Mother    Diabetes Mother    Hypertension Father    Diabetes Father    Hyperlipidemia Father     Social History   Tobacco Use   Smoking status: Never   Smokeless tobacco: Current    Types: Chew  Vaping Use   Vaping Use: Never used  Substance Use Topics   Alcohol use: Yes    Alcohol/week: 3.0 standard drinks    Types: 3 Glasses of wine per week   Drug use: No    Home Medications Prior to Admission medications   Medication Sig Start Date End Date Taking? Authorizing Provider  ACCU-CHEK FASTCLIX LANCETS MISC Use as directed once a day.  Dx code: E11.9 09/19/17   [name], Alferd Apa, DO   albuterol (ACCUNEB) 0.63 MG/3ML nebulizer solution Take 3 mLs (0.63 mg total) by nebulization every 6 (six) hours as needed for wheezing. 12/04/18   Fenton Foy, NP  albuterol (VENTOLIN HFA) 108 (90 Base) MCG/ACT inhaler Inhale 2 puffs into the lungs every 6 (six) hours as needed for wheezing or shortness of breath.     [provider]  allopurinol (ZYLOPRIM) 300 MG tablet Take 1 tablet (300 mg total) by mouth daily. 03/28/21   Silverio Decamp, MD  apixaban (ELIQUIS) 5 MG TABS tablet TAKE 1 TABLET BY MOUTH 2 TIMES DAILY. 08/03/20 08/03/21  Larey Dresser, MD  atorvastatin (LIPITOR) 40 MG tablet Take 1 tablet (40 mg total) by mouth at bedtime. 02/22/21 02/22/22  Rafael Bihari, FNP  blood glucose meter kit and supplies Dispense based on patient and insurance preference. Use as directed once a day. Dx Code E11.9. 09/06/17   [name], Alferd Apa, DO  Blood Glucose Monitoring Suppl (ACCU-CHEK GUIDE) w/Device KIT 1 each by Does not apply route daily. DX Code: E11.9 09/19/17   [name], Alferd Apa, DO  carvedilol (COREG) 3.125 MG tablet Take 1 tablet (3.125 mg total) by mouth 2 (two) times daily. 07/27/20   Larey Dresser, MD  fluticasone-salmeterol (ADVAIR HFA) [phone] MCG/ACT inhaler Inhale 2 puffs into the lungs 2 (two) times daily. 11/26/20   Martyn Ehrich, NP  glucose blood (ACCU-CHEK GUIDE) test strip Use as directed once a day.  Dx code: E11.9 09/19/17   [name], Alferd Apa, DO  guaiFENesin (MUCINEX) 600 MG 12 hr tablet Take 1 tablet (600 mg total) by mouth 2 (two) times daily as needed for to loosen phlegm. 08/17/17   Shirley Friar, PA-C  metFORMIN (GLUCOPHAGE) 500 MG tablet Take 1 tablet (500 mg total) by mouth 2 (two) times daily with a meal. 10/20/20   Shamleffer, Melanie Crazier, MD  nitroGLYCERIN (NITROSTAT) 0.4 MG SL tablet Place 1 tablet (0.4 mg total) under the tongue every 5 (five) minutes as needed for chest pain. 07/21/20   Larey Dresser, MD  OXYGEN Inhale  3 L into the lungs daily as needed (low oxygen).    [provider]  Pirfenidone (ESBRIET) 267 MG TABS Take 1 tab three times daily for 7 days, then 2 tabs three times daily for 7 days, then 3 tabs three times daily thereafter. 03/02/21   Brand Males, MD  predniSONE (DELTASONE) 10 MG tablet TAKE 1 TABLET (10 MG TOTAL) BY MOUTH DAILY WITH BREAKFAST. 02/23/21   Larey Dresser, MD  sacubitril-valsartan [facility])  24-26 MG TAKE 1 TABLET BY MOUTH 2 (TWO) TIMES DAILY. 04/06/20 04/30/21  Larey Dresser, MD  spironolactone (ALDACTONE) 25 MG tablet Take 0.5 tablets (12.5 mg total) by mouth daily. 02/22/21 05/23/21  Rafael Bihari, FNP  torsemide 40 MG TABS Take 40 mg by mouth daily. 03/16/21   Rafael Bihari, FNP    Allergies    Patient has no known allergies.  Review of Systems   Review of Systems  Respiratory:  Positive for cough and shortness of breath (Worse than baseline). Negative for hemoptysis.   Cardiovascular:  Positive for chest pain (Tightness) and orthopnea.  Gastrointestinal:  Positive for blood in stool and nausea. Negative for vomiting.  Musculoskeletal:  Positive for falls. Negative for back pain.  Neurological:  Positive for dizziness, loss of consciousness and weakness.    Physical Exam Updated Vital Signs BP (!) 81/60   Pulse (!) 123   Temp (!) 96.7 F (35.9 C) (Rectal)   Resp (!) 28   Ht _0  (1.753 m)   Wt 71.2 kg   SpO2 93%   BMI 23.18 kg/m   Constitutional: Chronically ill-appearing gentleman laying in bed.  Acute distress noted. HENT: Abrasion over mid-left forehead. Neck: Negative for posterior neck tenderness. Cardio: Tachycardic with regular rhythm. Pulm: Clear to auscultation bilaterally.  Patient is on oxygen. Chest: Midline sternal scar present. Abdomen: Soft, nontender, nondistended. MSK: Decreased bulk and tone. Skin: Bilateral upper and lower extremities are cool to touch.  Radial, DP pulses present bilaterally.  Cyanosis of  bilateral finger and toe nailbeds present.  Skin mottling is present over bilateral lower extremities. Neuro: Alert and oriented x3.  No focal deficit noted. Psych: Patient seems mildly agitated, however difficult to fully assess as he appears to be lethargic as well.   ED Results / Procedures / Treatments   Labs (all labs ordered are listed, but only abnormal results are displayed) Labs Reviewed  CBC WITH DIFFERENTIAL/PLATELET - Abnormal; Notable for the following components:      Result Value   RBC 2.86 (*)    Hemoglobin 11.5 (*)    HCT 36.6 (*)    MCV 128.0 (*)    MCH 40.2 (*)    RDW 17.4 (*)    nRBC 2.6 (*)    All other components within normal limits  LACTIC ACID, PLASMA - Abnormal; Notable for the following components:   Lactic Acid, Venous 4.6 (*)    All other components within normal limits  BASIC METABOLIC PANEL - Abnormal; Notable for the following components:   Chloride 121 (*)    CO2 8 (*)    BUN 68 (*)    Creatinine, Ser 3.01 (*)    Calcium 4.8 (*)    GFR, Estimated 23 (*)    All other components within normal limits  HEPATIC FUNCTION PANEL - Abnormal; Notable for the following components:   Total Protein 3.3 (*)    Albumin 1.7 (*)    Indirect Bilirubin 0.2 (*)    All other components within normal limits  MAGNESIUM - Abnormal; Notable for the following components:   Magnesium 1.2 (*)    All other components within normal limits  I-STAT CHEM 8, ED - Abnormal; Notable for the following components:   Sodium 127 (*)    Potassium 8.5 (*)    BUN >130 (*)    Creatinine, Ser 5.70 (*)    Glucose, Bld 101 (*)    Calcium, Ion 0.86 (*)    TCO2 16 (*)  Hemoglobin 12.6 (*)    HCT 37.0 (*)    All other components within normal limits  POC OCCULT BLOOD, ED - Abnormal; Notable for the following components:   Fecal Occult Bld POSITIVE (*)    All other components within normal limits  CBG MONITORING, ED - Abnormal; Notable for the following components:    Glucose-Capillary 104 (*)    All other components within normal limits  CULTURE, BLOOD (ROUTINE X 2)  CULTURE, BLOOD (ROUTINE X 2)  RESP PANEL BY RT-PCR (FLU A&B, COVID) ARPGX2  LACTIC ACID, PLASMA  OCCULT BLOOD X 1 CARD TO LAB, STOOL  URINALYSIS, ROUTINE W REFLEX MICROSCOPIC  SODIUM, URINE, RANDOM  CREATININE, URINE, RANDOM  BASIC METABOLIC PANEL  RENAL FUNCTION PANEL  TYPE AND SCREEN  TROPONIN I (HIGH SENSITIVITY)    EKG EKG Interpretation  Date/Time:  Monday April 04 2021 19:23:26 EDT Ventricular Rate:  104 PR Interval:  189 QRS Duration: 169 QT Interval:  385 QTC Calculation: 507 R Axis:   264 Text Interpretation: Sinus tachycardia Left atrial enlargement Nonspecific IVCD with LAD Anterolateral infarct, age indeterminate Artifact in lead(s) I III V1 V2 V4 V5 V6 Confirmed by Madalyn Rob [phone]) on 04/18/2021 7:54:55 PM  Radiology DG Chest 2 View  Result Date: 04/19/2021 CLINICAL DATA:  Syncope. EXAM: CHEST - 2 VIEW COMPARISON:  January 27, 2020 FINDINGS: Multiple sternal wires and vascular clips are seen. A single lead ventricular pacer is noted. A radiopaque stimulator and associated stimulator wire are seen overlying the lateral aspect of the mid right lung. Mild atelectasis is seen within the bilateral lung bases. There is no evidence of a pleural effusion or pneumothorax. The heart size and mediastinal contours are within normal limits. The visualized skeletal structures are unremarkable. IMPRESSION: 1. Evidence of prior median sternotomy/CABG. 2. Mild bibasilar atelectasis. Electronically Signed   By: Virgina Norfolk M.D.   On: 04/20/2021 20:24   CT Head Wo Contrast  Result Date: 04/02/2021 CLINICAL DATA:  Status post trauma. EXAM: CT HEAD WITHOUT CONTRAST TECHNIQUE: Contiguous axial images were obtained from the base of the skull through the vertex without intravenous contrast. COMPARISON:  None. FINDINGS: Brain: There is mild cerebral atrophy with widening of the  extra-axial spaces and ventricular dilatation. There are areas of decreased attenuation within the white matter tracts of the supratentorial brain, consistent with microvascular disease changes. Vascular: No hyperdense vessel or unexpected calcification. Skull: Normal. Negative for fracture or focal lesion. Sinuses/Orbits: There is marked severity right maxillary sinus, bilateral ethmoid sinus, sphenoid sinus and frontal sinus mucosal thickening. Postoperative changes are noted along the medial walls of the bilateral maxillary sinuses. Other: None. IMPRESSION: 1. No acute intracranial abnormality. 2. Generalized cerebral atrophy and microvascular disease changes of the supratentorial brain. 3. Marked severity paranasal sinus disease. Electronically Signed   By: Virgina Norfolk M.D.   On: 04/19/2021 20:50   CT Cervical Spine Wo Contrast  Result Date: 04/12/2021 CLINICAL DATA:  Status post trauma. EXAM: CT CERVICAL SPINE WITHOUT CONTRAST TECHNIQUE: Multidetector CT imaging of the cervical spine was performed without intravenous contrast. Multiplanar CT image reconstructions were also generated. COMPARISON:  None. FINDINGS: Alignment: Normal. Skull base and vertebrae: No acute fracture. No primary bone lesion or focal pathologic process. Soft tissues and spinal canal: No prevertebral fluid or swelling. No visible canal hematoma. A metallic density stimulator wire is noted within the neck soft tissues on the right. Disc levels: Mild multilevel endplate sclerosis is seen throughout the cervical spine. There is mild  to moderate severity narrowing of the anterior atlantoaxial articulation. Mild multilevel intervertebral disc space narrowing is seen. Moderate severity bilateral multilevel facet joint hypertrophy is noted. This is most prominent at the level of C2-C3. Upper chest: Negative. Other: None. IMPRESSION: 1. No acute osseous abnormality of the cervical spine. 2. Mild multilevel degenerative changes, as  described above. Electronically Signed   By: Virgina Norfolk M.D.   On: 04/20/2021 20:53    Procedures None  Medications Ordered in ED Medications  0.9 %  sodium chloride infusion ( Intravenous New Bag/Given 04/28/2021 2145)  lactated ringers bolus 500 mL (0 mLs Intravenous Stopped 04/27/2021 2009)  pantoprazole (PROTONIX) injection 40 mg (40 mg Intravenous Given 04/29/2021 2053)  cefTRIAXone (ROCEPHIN) 2 g in sodium chloride 0.9 % 100 mL IVPB (0 g Intravenous Stopped 04/16/2021 2123)  insulin aspart (novoLOG) injection 5 Units (5 Units Intravenous Given 04/13/2021 2052)    And  dextrose 50 % solution 50 mL (50 mLs Intravenous Given 04/02/2021 2050)  calcium gluconate 1 g/ 50 mL sodium chloride IVPB (0 mg Intravenous Stopped 05/02/2021 2149)  albuterol (VENTOLIN HFA) 108 (90 Base) MCG/ACT inhaler 4 puff (4 puffs Inhalation Given 04/03/2021 2053)  sodium bicarbonate injection 100 mEq (100 mEq Intravenous Given 04/20/2021 2050)  sodium zirconium cyclosilicate (LOKELMA) packet 10 g (10 g Oral Given 04/11/2021 2053)    ED Course  I have reviewed the triage vital signs and the nursing notes.  Pertinent labs & imaging results that were available during my care of the patient were reviewed by me and considered in my medical decision making (see chart for details).    MDM Rules/Calculators/A&P                           On initial presentation, patient was persistently hypotensive with complaints of syncopal episode resulting in head strike, dark stools, nausea, chest tightness, worsening dyspnea from baseline.  At that time he had received 0.5 L bolus in route with temporary improvement in BP, however he did become hypotensive again.  He received another 0.5 L LR bolus, and then was initiated on 250 mL/h NS.  Due to his head strike while on Eliquis, CT head and C-spine were completed and showed no acute intracranial abnormality or acute osseous abnormality of the cervical spine.  Chest x-ray did show bibasilar  atelectasis.  On review of patient history, it appears he has alcohol-related cardiomyopathy and was initiated on prophylactic Rocephin 2 g IV, Protonix 40 mg IV.  FOBT was positive.  Initial labs were significant for marked hyperkalemia of 8.5 and renal failure as evidenced by BUN/Cc of greater than 130/5.70.  EKG was significant for sinus tachycardia, left atrial enlargement, anterolateral infarct of undetermined age.  At this time nephrology was consulted and he was initiated on calcium gluconate 1 g, insulin 5 units and D5, bicarbonate, Lokelma 10 g.  Due to concerns of persistent hypotension 2/2 shock, PCCM was consulted.  Hemoglobin was stable at 11.5.  Lactic acid 4.6, is currently receiving fluids.  Blood culture is pending at this time.  Final Clinical Impression(s) / ED Diagnoses Final diagnoses:  Hypotension, unspecified hypotension type  Acute renal failure, unspecified acute renal failure type ([place])  Hyperkalemia  Gastrointestinal hemorrhage, unspecified gastrointestinal hemorrhage type    Rx / DC Orders ED Discharge Orders     None        Farrel Gordon, DO 04/18/2021 2208    Lucrezia Starch, MD 05/02/2021  2219

## 2021-04-04 NOTE — ED Notes (Signed)
Nephrologist at bedside.

## 2021-04-04 NOTE — ED Notes (Signed)
I messaged ICU about pt's O2 status. Hanging out at 89% on 6L [place]. No answer yet.

## 2021-04-04 NOTE — ED Notes (Signed)
See triage for assessment. Pt AxO x4. Pt has mottling with prolonged cap refill in bilateral feet. Multiple warm blankets placed on pt due to rectal temperature.

## 2021-04-04 NOTE — ED Notes (Signed)
Intensivist at bedside.

## 2021-04-04 NOTE — ED Notes (Signed)
Pt turned up to 6L [place] because O2 keeps dropping into 80s

## 2021-04-04 NOTE — ED Notes (Signed)
Pt's sheets changed underneath him and readjusted in bed. Found another pillow to place underneath his butt. Pt denies further needs. Side rails remain up and call light within reach.

## 2021-04-05 ENCOUNTER — Other Ambulatory Visit (HOSPITAL_COMMUNITY): Payer: Medicare HMO

## 2021-04-05 ENCOUNTER — Inpatient Hospital Stay (HOSPITAL_COMMUNITY): Payer: Medicare HMO

## 2021-04-05 DIAGNOSIS — N178 Other acute kidney failure: Secondary | ICD-10-CM

## 2021-04-05 DIAGNOSIS — E875 Hyperkalemia: Secondary | ICD-10-CM

## 2021-04-05 DIAGNOSIS — R570 Cardiogenic shock: Secondary | ICD-10-CM | POA: Diagnosis not present

## 2021-04-05 DIAGNOSIS — I959 Hypotension, unspecified: Secondary | ICD-10-CM

## 2021-04-05 DIAGNOSIS — N179 Acute kidney failure, unspecified: Secondary | ICD-10-CM | POA: Diagnosis not present

## 2021-04-05 LAB — COOXEMETRY PANEL
Carboxyhemoglobin: 0.7 % (ref 0.5–1.5)
Carboxyhemoglobin: 1.5 % (ref 0.5–1.5)
Methemoglobin: 0.9 % (ref 0.0–1.5)
Methemoglobin: 0.7 % (ref 0.0–1.5)
O2 Saturation: 41.7 %
O2 Saturation: 97.8 %
Total hemoglobin: 11.8 g/dL — ABNORMAL LOW (ref 12.0–16.0)
Total hemoglobin: 10.7 g/dL — ABNORMAL LOW (ref 12.0–16.0)

## 2021-04-05 LAB — TROPONIN I (HIGH SENSITIVITY)
Troponin I (High Sensitivity): 3879 ng/L (ref <18)
Troponin I (High Sensitivity): 3980 ng/L (ref <18)

## 2021-04-05 LAB — BASIC METABOLIC PANEL
Anion gap: 16 — ABNORMAL HIGH (ref 5–15)
BUN: 105 mg/dL — ABNORMAL HIGH (ref 6–20)
CO2: 17 mmol/L — ABNORMAL LOW (ref 22–32)
Calcium: 8.1 mg/dL — ABNORMAL LOW (ref 8.9–10.3)
Chloride: 97 mmol/L — ABNORMAL LOW (ref 98–111)
Creatinine, Ser: 5.3 mg/dL — ABNORMAL HIGH (ref 0.61–1.24)
GFR, Estimated: 12 mL/min — ABNORMAL LOW (ref >60)
Glucose, Bld: 209 mg/dL — ABNORMAL HIGH (ref 70–99)
Potassium: 5.1 mmol/L (ref 3.5–5.1)
Sodium: 130 mmol/L — ABNORMAL LOW (ref 135–145)

## 2021-04-05 LAB — MAGNESIUM: Magnesium: 2.6 mg/dL — ABNORMAL HIGH (ref 1.7–2.4)

## 2021-04-05 LAB — GLUCOSE, CAPILLARY
Glucose-Capillary: 105 mg/dL — ABNORMAL HIGH (ref 70–99)
Glucose-Capillary: 109 mg/dL — ABNORMAL HIGH (ref 70–99)
Glucose-Capillary: 158 mg/dL — ABNORMAL HIGH (ref 70–99)
Glucose-Capillary: 166 mg/dL — ABNORMAL HIGH (ref 70–99)
Glucose-Capillary: 207 mg/dL — ABNORMAL HIGH (ref 70–99)
Glucose-Capillary: 98 mg/dL (ref 70–99)

## 2021-04-05 LAB — CBC
HCT: 35.4 % — ABNORMAL LOW (ref 39.0–52.0)
Hemoglobin: 11.1 g/dL — ABNORMAL LOW (ref 13.0–17.0)
MCH: 40.4 pg — ABNORMAL HIGH (ref 26.0–34.0)
MCHC: 31.4 g/dL (ref 30.0–36.0)
MCV: 128.7 fL — ABNORMAL HIGH (ref 80.0–100.0)
Platelets: 99 10*3/uL — ABNORMAL LOW (ref 150–400)
RBC: 2.75 MIL/uL — ABNORMAL LOW (ref 4.22–5.81)
RDW: 17 % — ABNORMAL HIGH (ref 11.5–15.5)
WBC: 6.4 10*3/uL (ref 4.0–10.5)
nRBC: 1.9 % — ABNORMAL HIGH (ref 0.0–0.2)

## 2021-04-05 LAB — HIV ANTIBODY (ROUTINE TESTING W REFLEX): HIV Screen 4th Generation wRfx: NONREACTIVE

## 2021-04-05 LAB — RENAL FUNCTION PANEL
Albumin: 3 g/dL — ABNORMAL LOW (ref 3.5–5.0)
Albumin: 3 g/dL — ABNORMAL LOW (ref 3.5–5.0)
Anion gap: 20 — ABNORMAL HIGH (ref 5–15)
Anion gap: 19 — ABNORMAL HIGH (ref 5–15)
BUN: 106 mg/dL — ABNORMAL HIGH (ref 6–20)
BUN: 103 mg/dL — ABNORMAL HIGH (ref 6–20)
CO2: 16 mmol/L — ABNORMAL LOW (ref 22–32)
CO2: 16 mmol/L — ABNORMAL LOW (ref 22–32)
Calcium: 8.6 mg/dL — ABNORMAL LOW (ref 8.9–10.3)
Calcium: 8.4 mg/dL — ABNORMAL LOW (ref 8.9–10.3)
Chloride: 99 mmol/L (ref 98–111)
Chloride: 97 mmol/L — ABNORMAL LOW (ref 98–111)
Creatinine, Ser: 5.12 mg/dL — ABNORMAL HIGH (ref 0.61–1.24)
Creatinine, Ser: 5.51 mg/dL — ABNORMAL HIGH (ref 0.61–1.24)
GFR, Estimated: 12 mL/min — ABNORMAL LOW (ref >60)
GFR, Estimated: 11 mL/min — ABNORMAL LOW (ref >60)
Glucose, Bld: 118 mg/dL — ABNORMAL HIGH (ref 70–99)
Glucose, Bld: 179 mg/dL — ABNORMAL HIGH (ref 70–99)
Phosphorus: 7.8 mg/dL — ABNORMAL HIGH (ref 2.5–4.6)
Phosphorus: 7.7 mg/dL — ABNORMAL HIGH (ref 2.5–4.6)
Potassium: 5.4 mmol/L — ABNORMAL HIGH (ref 3.5–5.1)
Potassium: 5.4 mmol/L — ABNORMAL HIGH (ref 3.5–5.1)
Sodium: 135 mmol/L (ref 135–145)
Sodium: 132 mmol/L — ABNORMAL LOW (ref 135–145)

## 2021-04-05 LAB — PROCALCITONIN: Procalcitonin: 1.62 ng/mL

## 2021-04-05 LAB — LACTIC ACID, PLASMA
Lactic Acid, Venous: 3.7 mmol/L (ref 0.5–1.9)
Lactic Acid, Venous: 3.2 mmol/L (ref 0.5–1.9)
Lactic Acid, Venous: 1.5 mmol/L (ref 0.5–1.9)

## 2021-04-05 LAB — HEMOGLOBIN A1C
Hgb A1c MFr Bld: 5.5 % (ref 4.8–5.6)
Mean Plasma Glucose: 111.15 mg/dL

## 2021-04-05 LAB — MRSA NEXT GEN BY PCR, NASAL: MRSA by PCR Next Gen: NOT DETECTED

## 2021-04-05 MED ORDER — PRISMASOL BGK 4/2.5 32-4-2.5 MEQ/L REPLACEMENT SOLN
Status: DC
  Filled 2021-04-05 (×11): qty 5000

## 2021-04-05 MED ORDER — NOREPINEPHRINE 4 MG/250ML-% IV SOLN
2.0000 ug/min | INTRAVENOUS | Status: DC
  Administered 2021-04-05: 2 ug/min via INTRAVENOUS
  Administered 2021-04-05: 10 ug/min via INTRAVENOUS
  Filled 2021-04-05 (×2): qty 250

## 2021-04-05 MED ORDER — SODIUM CHLORIDE 0.9 % IV SOLN: 250.0000 mL | INTRAVENOUS | Status: DC

## 2021-04-05 MED ORDER — HEPARIN SODIUM (PORCINE) 1000 UNIT/ML DIALYSIS
1000.0000 [IU] | INTRAMUSCULAR | Status: DC | PRN
  Administered 2021-04-05 – 2021-04-06 (×3): 2400 [IU] via INTRAVENOUS_CENTRAL
  Filled 2021-04-05: qty 3
  Filled 2021-04-05 (×2): qty 6
  Filled 2021-04-05: qty 3
  Filled 2021-04-05: qty 4
  Filled 2021-04-05 (×2): qty 6

## 2021-04-05 MED ORDER — PRISMASOL BGK 4/2.5 32-4-2.5 MEQ/L REPLACEMENT SOLN
Status: DC
  Filled 2021-04-05 (×13): qty 5000

## 2021-04-05 MED ORDER — ASPIRIN EC 81 MG PO TBEC
81.0000 mg | DELAYED_RELEASE_TABLET | Freq: Every day | ORAL | Status: DC
  Administered 2021-04-05 – 2021-04-10 (×6): 81 mg via ORAL
  Filled 2021-04-05 (×6): qty 1

## 2021-04-05 MED ORDER — NOREPINEPHRINE 16 MG/250ML-% IV SOLN
0.0000 ug/min | INTRAVENOUS | Status: DC
  Administered 2021-04-05: 4 ug/min via INTRAVENOUS
  Administered 2021-04-05: 20 ug/min via INTRAVENOUS
  Administered 2021-04-06: 14 ug/min via INTRAVENOUS
  Administered 2021-04-06: 42 ug/min via INTRAVENOUS
  Filled 2021-04-05 (×3): qty 250
  Filled 2021-04-05: qty 500

## 2021-04-05 MED ORDER — LIP MEDEX EX OINT
TOPICAL_OINTMENT | CUTANEOUS | Status: DC | PRN
  Filled 2021-04-05 (×2): qty 7

## 2021-04-05 MED ORDER — PRISMASOL BGK 4/2.5 32-4-2.5 MEQ/L EC SOLN
Status: DC
  Filled 2021-04-05 (×48): qty 5000

## 2021-04-05 MED ORDER — FENTANYL CITRATE (PF) 100 MCG/2ML IJ SOLN
INTRAMUSCULAR | Status: AC
  Filled 2021-04-05: qty 2

## 2021-04-05 MED ORDER — SODIUM CHLORIDE 0.9 % FOR CRRT: INTRAVENOUS_CENTRAL | Status: DC | PRN

## 2021-04-05 MED ORDER — HEPARIN SOD (PORK) LOCK FLUSH 100 UNIT/ML IV SOLN
500.0000 [IU] | Freq: Once | INTRAVENOUS | Status: DC
  Filled 2021-04-05: qty 5

## 2021-04-05 MED ORDER — ATORVASTATIN CALCIUM 40 MG PO TABS
40.0000 mg | ORAL_TABLET | Freq: Every day | ORAL | Status: DC
  Administered 2021-04-05 – 2021-04-10 (×6): 40 mg via ORAL
  Filled 2021-04-05 (×6): qty 1

## 2021-04-05 MED ORDER — HYDROCORTISONE SOD SUC (PF) 100 MG IJ SOLR
100.0000 mg | Freq: Three times a day (TID) | INTRAMUSCULAR | Status: DC
  Administered 2021-04-05 – 2021-04-10 (×16): 100 mg via INTRAVENOUS
  Filled 2021-04-05 (×16): qty 2

## 2021-04-05 MED ORDER — FENTANYL CITRATE (PF) 100 MCG/2ML IJ SOLN
25.0000 ug | INTRAMUSCULAR | Status: DC | PRN
  Administered 2021-04-05: 25 ug via INTRAVENOUS

## 2021-04-05 MED ORDER — CHLORHEXIDINE GLUCONATE CLOTH 2 % EX PADS
6.0000 | MEDICATED_PAD | Freq: Every day | CUTANEOUS | Status: DC
  Administered 2021-04-05 – 2021-04-07 (×3): 6 via TOPICAL

## 2021-04-05 MED ORDER — SODIUM ZIRCONIUM CYCLOSILICATE 10 G PO PACK
10.0000 g | PACK | Freq: Once | ORAL | Status: AC
  Administered 2021-04-05: 10 g via ORAL
  Filled 2021-04-05: qty 1

## 2021-04-05 MED ORDER — MILRINONE LACTATE IN DEXTROSE 20-5 MG/100ML-% IV SOLN
0.2500 ug/kg/min | INTRAVENOUS | Status: DC
Start: 2021-04-05 — End: 2021-04-14
  Administered 2021-04-05 – 2021-04-14 (×13): 0.25 ug/kg/min via INTRAVENOUS
  Filled 2021-04-05 (×13): qty 100

## 2021-04-05 MED ORDER — PHENYLEPHRINE HCL-NACL 20-0.9 MG/250ML-% IV SOLN: 25.0000 ug/min | INTRAVENOUS | Status: DC

## 2021-04-05 MED ORDER — HEPARIN SODIUM (PORCINE) 1000 UNIT/ML DIALYSIS
1000.0000 [IU] | INTRAMUSCULAR | Status: DC | PRN
  Administered 2021-04-05: 1000 [IU] via INTRAVENOUS_CENTRAL
  Filled 2021-04-05: qty 1
  Filled 2021-04-05 (×2): qty 6

## 2021-04-05 NOTE — Progress Notes (Signed)
CPAP set up and ready in pt room. Pt not ready to go on yet. RT will cont to monitor.

## 2021-04-05 NOTE — Plan of Care (Signed)
  Problem: Health Behavior/Discharge Planning: Goal: Ability to manage health-related needs will improve Outcome: Progressing

## 2021-04-05 NOTE — Procedures (Signed)
Arterial Catheter Insertion Procedure Note  Vincent Chandler  [id number]  [birth year]  Date:04/05/21  Time:12:19 PM    Provider Performing: Marianna Payment    Procedure: Insertion of Arterial Line [phone]) with US guidance ([id number])   Indication(s) Blood pressure monitoring and/or need for frequent ABGs  Consent Risks of the procedure as well as the alternatives and risks of each were explained to the patient and/or caregiver.  Consent for the procedure was obtained and is signed in the bedside chart  Anesthesia Lidocaine 1%   Time Out Verified patient identification, verified procedure, site/side was marked, verified correct patient position, special equipment/implants available, medications/allergies/relevant history reviewed, required imaging and test results available.   Sterile Technique Maximal sterile technique including full sterile barrier drape, hand hygiene, sterile gown, sterile gloves, mask, hair covering, sterile ultrasound probe cover (if used).   Procedure Description Area of catheter insertion was cleaned with chlorhexidine and draped in sterile fashion. With real-time ultrasound guidance an arterial catheter was placed into the right femoral artery.  Appropriate arterial tracings confirmed on monitor.     Complications/Tolerance None; patient tolerated the procedure well.   EBL Minimal   Specimen(s) None  Lawerance Cruel, D.O.  Internal Medicine Resident, PGY-3 [facility]  Pager: [phone] 12:19 PM, 04/05/2021

## 2021-04-05 NOTE — Progress Notes (Signed)
Inpatient Diabetes Program Recommendations  AACE/ADA: New Consensus Statement on Inpatient Glycemic Control (2015)  Target Ranges:  Prepandial:   less than 140 mg/dL      Peak postprandial:   less than 180 mg/dL (1-2 hours)      Critically ill patients:  140 - 180 mg/dL   Lab Results  Component Value Date   GLUCAP 105 (H) 04/05/2021   HGBA1C 5.5 04/05/2021    Review of Glycemic Control Results for LARIN, WEISSBERG (MRN [id number]) as of 04/05/2021 11:41  Ref. Range 04/29/2021 23:50 04/05/2021 00:19 04/05/2021 00:56 04/05/2021 03:31  Glucose-Capillary Latest Ref Range: 70 - 99 mg/dL 34 (LL) 98 109 (H) 105 (H)   Diabetes history: Type 2 DM Outpatient Diabetes medications: Metformin 500 mg BID Current orders for Inpatient glycemic control: Novolog 0-9 units Q4H  Inpatient Diabetes Program Recommendations:    Noted hypoglycemia of 34 mg/dL following treatment for hyperkalemia.  With current renal status and hypoglycemia, would recommend decreasing correction to Novolog 0-6 units Q4H.   Also, would be guarded given GFR baseline on resuming Metformin at discharge.   Thanks, Bronson Curb, MSN, RNC-OB Diabetes Coordinator [phone] (8a-5p)

## 2021-04-05 NOTE — Procedures (Signed)
Central Venous Catheter Insertion Procedure Note  Vincent Chandler  [id number]  08-Feb-1961  Date:04/05/21  Time:3:50 PM   Provider Performing:Nayellie Sanseverino Cipriano [place]   Procedure: Insertion of Non-tunneled Central Venous [phone]) with US guidance ([id number])   Indication(s) Medication administration  Consent Risks of the procedure as well as the alternatives and risks of each were explained to the patient and/or caregiver.  Consent for the procedure was obtained and is signed in the bedside chart  Anesthesia Topical only with 1% lidocaine   Timeout Verified patient identification, verified procedure, site/side was marked, verified correct patient position, special equipment/implants available, medications/allergies/relevant history reviewed, required imaging and test results available.  Sterile Technique Maximal sterile technique including full sterile barrier drape, hand hygiene, sterile gown, sterile gloves, mask, hair covering, sterile ultrasound probe cover (if used).  Procedure Description Area of catheter insertion was cleaned with chlorhexidine and draped in sterile fashion.  With real-time ultrasound guidance a central venous catheter was placed into the left femoral vein. Nonpulsatile blood flow and easy flushing noted in all ports.  The catheter was sutured in place and sterile dressing applied.  Complications/Tolerance None; patient tolerated the procedure well. Chest X-ray is ordered to verify placement for internal jugular or subclavian cannulation.   Chest x-ray is not ordered for femoral cannulation.  EBL Minimal  Specimen(s) None

## 2021-04-05 NOTE — Progress Notes (Signed)
Central Venous Catheter Insertion Procedure Note Vincent Chandler [id number] 06-Feb-1961  Procedure: Insertion of Central Venous Catheter Indications: Assessment of intravascular volume and Drug and/or fluid administration  Procedure Details Consent: Risks of procedure as well as the alternatives and risks of each were explained to the (patient/caregiver).  Consent for procedure obtained. Time Out: Verified patient identification, verified procedure, site/side was marked, verified correct patient position, special equipment/implants available, medications/allergies/relevent history reviewed, required imaging and test results available.  Performed  Maximum sterile technique was used including antiseptics, cap, gloves, gown, hand hygiene, mask, and sheet. Skin prep: Iodine solution; local anesthetic administered A antimicrobial bonded/coated triple lumen catheter was placed in the left internal jugular vein using the Seldinger technique. Catheter placed to 15 cm. Blood aspirated via all 3 ports and then flushed x 3. Line sutured x 2 and dressing applied.  Ultrasound guidance used.Yes.    Evaluation Blood flow good Complications: No apparent complications Patient did tolerate procedure well. Chest X-ray ordered to verify placement.  CXR: normal.  Vincent Molyneux, DO PGY-3 IMTS

## 2021-04-05 NOTE — Progress Notes (Addendum)
Attending addendum 04/05/2021 I saw and evaluated the patient. Discussed with resident and agree with resident's findings and plan as documented in the resident's note.  I have seen and evaluated the patient for shock.  S:  No events, fatigued, did not sleep well.  O: Blood pressure 114/77, pulse 96, temperature (!) 97.4 F (36.3 C), temperature source Oral, resp. rate 16, height 5' 9" (1.753 m), weight 75.1 kg, SpO2 98 %.  Chronically ill man in NAD Lungs with bibasilar crackles Ext with mild anasarca JVD is up Moves all 4 ext to command  Coox 41% Renal function worse  A:  Decompensated ischemic cardiogenic shock with cardiorenal syndrome Underlying amio toxicity with UIP features potentiating cardiopulmonary compromise; on chronic steroids Syncope- ICD to be interrogated  P:  - To start CRRT, inotropes after discussion with CHF team - Levophed titrated to MAP 65 - To get device interrogated and repeat echo - Fine to add stress steroids - Not sure what to do with abx, continue for now - Guarded prognosis, may need to consider palliative care involvement  Patient critically ill due to shock Interventions to address this today pressor titration Risk of deterioration without these interventions is high  I personally spent 33 minutes providing critical care not including any separately billable procedures  Erskine Emery MD [facility] Prefer epic messenger for cross cover needs If after hours, please call E-link      NAME:  Vincent Chandler, MRN:  [id number], DOB:  October 27, 1960, LOS: 1 ADMISSION DATE:  04/07/2021, CONSULTATION DATE:  04/19/2021 REFERRING MD:  EDP, CHIEF COMPLAINT:  Syncope   Brief History   Vincent Chandler is a 60 year old male with past medical history significant for HFrEF, cardiomyopathy, AICD, atrial flutter on Eliquis, CAD, ILD on 3 L home O2 and OSA on CPAP, type 2 diabetes, GERD and hiatal hernia who presented to the ED after a syncopal  episode while walking to the bathroom, sat down in his bed and then woke up on the floor.  He reports several weeks of lower extremity weakness and generally not feeling well with dizziness over the last several days along with dark stools.  He also reports nausea and constipation, denies any chest pain, palpitations, fevers, abdominal pain, diarrhea, new cough or URI symptoms.   On EMS arrival, patient was awake and alert though hypotensive.  He was given 500 cc IV fluid bolus.  In the ED, head and C-spine CTs were negative for acute findings, CXR without any infiltrate.  I-STAT was significant for potassium of 8.5, creatinine 5.7, hemoglobin 12.   He was given additional 500 cc LR and nephrology was consulted.  He has been urinating, reports decreased p.o. intake over several days.  He was given insulin, calcium, albuterol, and Lokelma and additional IV fluid ordered.  Lactic acid was 4.  Lab run BMP is pending.  PCCM consulted in the setting.  Past Medical History  HFrEF (EF of 25-30%) s/p AICD  CAD s/p CABG x4 Intermittent Afib  HLD OSA on CPAP ILD on chronic steroid  T2DM CKD Stage 3 Significant Hospital Events   10/3 - consulted for hyperkalemia and hypotension and admitted to PCCM   Consults:  Advanced HF   Procedures:  10/4 - HD/CVC placed   Significant Diagnostic Tests:   DG Chest 2 View  Result Date: 04/03/2021 CLINICAL DATA:  Syncope. EXAM: CHEST - 2 VIEW COMPARISON:  January 27, 2020 FINDINGS: Multiple sternal wires and vascular clips are seen. A  single lead ventricular pacer is noted. A radiopaque stimulator and associated stimulator wire are seen overlying the lateral aspect of the mid right lung. Mild atelectasis is seen within the bilateral lung bases. There is no evidence of a pleural effusion or pneumothorax. The heart size and mediastinal contours are within normal limits. The visualized skeletal structures are unremarkable. IMPRESSION: 1. Evidence of prior median  sternotomy/CABG. 2. Mild bibasilar atelectasis. Electronically Signed   By: Virgina Norfolk M.D.   On: 04/27/2021 20:24   CT Head Wo Contrast  Result Date: 05/01/2021 CLINICAL DATA:  Status post trauma. EXAM: CT HEAD WITHOUT CONTRAST TECHNIQUE: Contiguous axial images were obtained from the base of the skull through the vertex without intravenous contrast. COMPARISON:  None. FINDINGS: Brain: There is mild cerebral atrophy with widening of the extra-axial spaces and ventricular dilatation. There are areas of decreased attenuation within the white matter tracts of the supratentorial brain, consistent with microvascular disease changes. Vascular: No hyperdense vessel or unexpected calcification. Skull: Normal. Negative for fracture or focal lesion. Sinuses/Orbits: There is marked severity right maxillary sinus, bilateral ethmoid sinus, sphenoid sinus and frontal sinus mucosal thickening. Postoperative changes are noted along the medial walls of the bilateral maxillary sinuses. Other: None. IMPRESSION: 1. No acute intracranial abnormality. 2. Generalized cerebral atrophy and microvascular disease changes of the supratentorial brain. 3. Marked severity paranasal sinus disease. Electronically Signed   By: Virgina Norfolk M.D.   On: 04/29/2021 20:50   CT Cervical Spine Wo Contrast  Result Date: 04/10/2021 CLINICAL DATA:  Status post trauma. EXAM: CT CERVICAL SPINE WITHOUT CONTRAST TECHNIQUE: Multidetector CT imaging of the cervical spine was performed without intravenous contrast. Multiplanar CT image reconstructions were also generated. COMPARISON:  None. FINDINGS: Alignment: Normal. Skull base and vertebrae: No acute fracture. No primary bone lesion or focal pathologic process. Soft tissues and spinal canal: No prevertebral fluid or swelling. No visible canal hematoma. A metallic density stimulator wire is noted within the neck soft tissues on the right. Disc levels: Mild multilevel endplate sclerosis is seen  throughout the cervical spine. There is mild to moderate severity narrowing of the anterior atlantoaxial articulation. Mild multilevel intervertebral disc space narrowing is seen. Moderate severity bilateral multilevel facet joint hypertrophy is noted. This is most prominent at the level of C2-C3. Upper chest: Negative. Other: None. IMPRESSION: 1. No acute osseous abnormality of the cervical spine. 2. Mild multilevel degenerative changes, as described above. Electronically Signed   By: Virgina Norfolk M.D.   On: 04/30/2021 20:53   Micro Data:  10/03 - COVD19/Influ negative  10/03 - MRSA PCR - negative  10/03 - BCx pending  Antimicrobials:   Antibiotics Given (last 72 hours)     Date/Time Action Medication Dose Rate   04/13/2021 2053 New Bag/Given   cefTRIAXone (ROCEPHIN) 2 g in sodium chloride 0.9 % 100 mL IVPB 2 g 200 mL/hr      Interim History/Subjective:  Patient resting in bed. States that he has been having worsening shortness of breath over the past few weeks. He has also been having some presyncopal symptoms when he stand or changes position. He currently denies chest pain or palpations.   Objective:  Blood pressure 114/77, pulse 96, temperature (!) 97.4 F (36.3 C), temperature source Oral, resp. rate 16, height _0  (1.753 m), weight 75.1 kg, SpO2 98 %.        Intake/Output Summary (Last 24 hours) at 04/05/2021 0905 Last data filed at 04/05/2021 0800 Gross per 24 hour  Intake  1410.97 ml  Output --  Net 1410.97 ml   Filed Weights   04/25/2021 1916 04/05/21 0500  Weight: 71.2 kg 75.1 kg    Examination: General: alert and ill appearing.  HENT: Elevated JVD Lungs: Bilateral crackles and shallow breath sounds.  Cardiovascular: Tachycardic without obvious murmur.  Abdomen: nondistended or tender Extremities: cool with thready peripheral pulses Neuro: no focal neurologic deficits.    Resolved Hospital Problem list     Assessment & Plan:   Acute on chronic  HFrEF Cardiogenic shock  CAD s/p CABG x4 Patient presents with after a syncopal episode with signs and symptoms concerning for cardiogenic shock. CVC was place and Coox are show 41%. Milrinone was started and advanced heart failure was consulted. Patients has ILD/PF that is significantly worsening his right heart failure and likely contributed to his syncopal episode.  - CVC/HD cath placed - Trend Coox and CVP - Consulted Advanced HF team  - Started IV milrinone. - Echo pending   Intermittent Aflutter on Eliquis: Patient has a history of Afib on eliquis. He appears to be in sinus rhythm with mutliple PVCs. CHADSVACS of 4. Patient is tachycardic but will hold off on AV nodal blocking agents in the setting of cardiogenic shock. - Continue Eliquis  - Hold BB/CCB  Cardiorenal Syndrome: Anuric AKI:  Hx of Stage Stage 3:  Hyperkalemia Patient present with significantly worsening kidney function with anuria in the setting of acute on chronic HF and likely cardiorenal syndrome.  - HD line placed.  - Nephrology consulted for RRT. - Monitor kindey function and urine output daily.   ILD: Patient has a history of ILD/PF on chronic steroids. - Will start stress dosed steroids   T2DM:  - Q4 hours CBG  - SSI  Goal CBG of 120-180  Best Practice:  Diet: NPO Pain/Anxiety/Delirium protocol (if indicated): n/a VAP protocol (if indicated): n/a DVT prophylaxis: DOAC GI prophylaxis: N/A Glucose control: SSI Lines: Lines: Central line Foley: Foley:  Yes, and it is still needed Mobility: bed rest Code Status: full code Family Communication: pending Disposition: ICU  Labs   CBC: Recent Labs  Lab 05/02/2021 1918 05/02/2021 1929 04/05/21 0019  WBC 6.5  --  6.4  NEUTROABS 4.9  --   --   HGB 11.5* 12.6* 11.1*  HCT 36.6* 37.0* 35.4*  MCV 128.0*  --  128.7*  PLT PLATELET CLUMPS NOTED ON SMEAR, UNABLE TO ESTIMATE  --  99*    Basic Metabolic Panel: Recent Labs  Lab 04/24/2021 1929  04/07/2021 2022 04/18/2021 2140 04/05/21 0019  NA 127* 139 138 135  K 8.5* 3.6 5.1 5.4*  CL 105 121* 104 99  CO2  --  8* 16* 16*  GLUCOSE 101* 76 125* 118*  BUN >130* 68* 99* 106*  CREATININE 5.70* 3.01* 4.88* 5.12*  CALCIUM  --  4.8* 7.9* 8.6*  MG  --  1.2*  --  2.6*  PHOS  --   --   --  7.8*   GFR: Estimated Creatinine Clearance: 15.5 mL/min (A) (by C-G formula based on SCr of 5.12 mg/dL (H)). Recent Labs  Lab 04/06/2021 1918 04/03/2021 1944 04/22/2021 2219 04/05/21 0019 04/05/21 0208  PROCALCITON  --   --   --  1.62  --   WBC 6.5  --   --  6.4  --   LATICACIDVEN  --  4.6* 3.7*  --  3.2*    Liver Function Tests: Recent Labs  Lab 04/18/2021 2022 04/05/21 0019  AST  22  --   ALT 12  --   ALKPHOS 38  --   BILITOT 0.4  --   PROT 3.3*  --   ALBUMIN 1.7* 3.0*   No results for input(s): LIPASE, AMYLASE in the last 168 hours. No results for input(s): AMMONIA in the last 168 hours.  ABG    Component Value Date/Time   PHART 7.392 07/24/2018 0722   PCO2ART 34.3 07/24/2018 0722   PO2ART 94.7 07/24/2018 0722   HCO3 23.2 07/29/2020 1154   HCO3 22.5 07/29/2020 1154   TCO2 16 (L) 04/03/2021 1929   ACIDBASEDEF 3.0 (H) 07/29/2020 1154   ACIDBASEDEF 4.0 (H) 07/29/2020 1154   O2SAT 71.0 07/29/2020 1154   O2SAT 71.0 07/29/2020 1154     Coagulation Profile: No results for input(s): INR, PROTIME in the last 168 hours.  Cardiac Enzymes: No results for input(s): CKTOTAL, CKMB, CKMBINDEX, TROPONINI in the last 168 hours.  HbA1C: Hgb A1c MFr Bld  Date/Time Value Ref Range Status  04/05/2021 12:19 AM 5.5 4.8 - 5.6 % Final    Comment:    (NOTE) Pre diabetes:          5.7%-6.4%  Diabetes:              >6.4%  Glycemic control for   <7.0% adults with diabetes   06/14/2020 02:02 PM 6.8 (H) 4.6 - 6.5 % Final    Comment:    Glycemic Control Guidelines for People with Diabetes:Non Diabetic:  <6%Goal of Therapy: <7%Additional Action Suggested:  >8%     CBG: Recent Labs  Lab  04/21/2021 2143 04/05/2021 2350 04/05/21 0019 04/05/21 0056 04/05/21 0331  GLUCAP 104* 34* 98 109* 105*    Review of Systems:   See above  Past Medical History  He,  has a past medical history of AICD (automatic cardioverter/defibrillator) present, Anginal pain ([place]), Anxiety, Asthma, Atrial fibrillation-postoperative (11/28/2012), Cardiomyopathy, CHF (congestive heart failure) ([place]), Chronic back pain, Coronary artery disease, Depression, Diabetes mellitus type II (dx'd in the 1990's), GERD (gastroesophageal reflux disease), H/O hiatal hernia, Hemorrhoids, History of hypogonadism, History of kidney stones, Hyperlipidemia, Hypotension, OSA on CPAP, Pneumonia, and Urinary incontinence.   Surgical History    Past Surgical History:  Procedure Laterality Date   A-FLUTTER ABLATION N/A 09/12/2017   Procedure: A-FLUTTER ABLATION;  Surgeon: Deboraha Sprang, MD;  Location: [place];  Service: Cardiovascular;  Laterality: N/A;   CARDIAC DEFIBRILLATOR PLACEMENT  01/15/2015   CARDIOVERSION N/A 08/10/2017   Procedure: CARDIOVERSION;  Surgeon: Larey Dresser, MD;  Location[facility] ENDOSCOPY;  Service: Cardiovascular;  Laterality: N/A;   CORONARY ANGIOPLASTY WITH STENT PLACEMENT  ~ 2003   CORONARY ARTERY BYPASS GRAFT N/A 08/15/2012   Procedure: CORONARY ARTERY BYPASS GRAFTING (CABG);  Surgeon: Ivin Poot, MD;  Location: [place];  Service: Open Heart Surgery;  Laterality: N/A;  Coronary Artery Bypass Grafting Times Four Using Left Internal Mammary Artery and Right Saphenous Leg Vein Harvested Endoscopically   EP IMPLANTABLE DEVICE N/A 01/15/2015   Procedure: ICD Implant;  Surgeon: Deboraha Sprang, MD;  Location: [place] [facility];  Service: Cardiovascular;  Laterality: N/A;   LEFT HEART CATHETERIZATION WITH CORONARY ANGIOGRAM N/A 08/08/2012   Procedure: LEFT HEART CATHETERIZATION WITH CORONARY ANGIOGRAM;  Surgeon: Peter M Martinique, MD;  Location: [facility] CATH LAB;  Service: Cardiovascular;  Laterality: N/A;    LEFT HEART CATHETERIZATION WITH CORONARY/GRAFT ANGIOGRAM N/A 07/21/2014   Procedure: LEFT HEART CATHETERIZATION WITH Beatrix Fetters;  Surgeon: Larey Dresser, MD;  Location: [place] CATH LAB;  Service: Cardiovascular;  Laterality: N/A;   MULTIPLE EXTRACTIONS WITH ALVEOLOPLASTY N/A 10/04/2017   Procedure: Extraction of tooth #'s 5 and 14 with alveoloplasty and gross debridement of remaining teeth;  Surgeon: [name], DDS;  Location: [place];  Service: Oral Surgery;  Laterality: N/A;   PERCUTANEOUS CORONARY STENT INTERVENTION (PCI-S)  07/21/2014   Procedure: PERCUTANEOUS CORONARY STENT INTERVENTION (PCI-S);  Surgeon: Larey Dresser, MD;  Location: [facility] CATH LAB;  Service: Cardiovascular;;   REFRACTIVE SURGERY Bilateral 1990's   RIGHT HEART CATH N/A 10/03/2017   Procedure: RIGHT HEART CATH;  Surgeon: Larey Dresser, MD;  Location: [place] [facility];  Service: Cardiovascular;  Laterality: N/A;   RIGHT HEART CATH N/A 12/11/2018   Procedure: RIGHT HEART CATH;  Surgeon: Larey Dresser, MD;  Location: [place] [facility];  Service: Cardiovascular;  Laterality: N/A;   RIGHT/LEFT HEART CATH AND CORONARY/GRAFT ANGIOGRAPHY N/A 07/29/2020   Procedure: RIGHT/LEFT HEART CATH AND CORONARY/GRAFT ANGIOGRAPHY;  Surgeon: Larey Dresser, MD;  Location: [place] [facility];  Service: Cardiovascular;  Laterality: N/A;   TEE WITHOUT CARDIOVERSION N/A 08/10/2017   Procedure: TRANSESOPHAGEAL ECHOCARDIOGRAM (TEE);  Surgeon: Larey Dresser, MD;  Location: [facility] ENDOSCOPY;  Service: Cardiovascular;  Laterality: N/A;   TONSILLECTOMY  ~ [place] Bilateral 11/28/2017   Procedure: VIDEO BRONCHOSCOPY WITHOUT FLUORO;  Surgeon: Brand Males, MD;  Location: [facility] ENDOSCOPY;  Service: Endoscopy;  Laterality: Bilateral;     Social History   reports that he has never smoked. His smokeless tobacco use includes chew. He reports current alcohol use of about 3.0 standard drinks per week. He reports that he does  not use drugs.   Family History   His family history includes Diabetes in his father and mother; Hyperlipidemia in his father; Hypertension in his father and mother.   Allergies No Known Allergies   Home Medications  Prior to Admission medications   Medication Sig Start Date End Date Taking? Authorizing Provider  ACCU-CHEK FASTCLIX LANCETS MISC Use as directed once a day.  Dx code: E11.9 09/19/17  Yes Ann Held, DO  acetaminophen (TYLENOL) 500 MG tablet Take 1,000 mg by mouth every 6 (six) hours as needed for moderate pain or headache.   Yes [provider]  albuterol (ACCUNEB) 0.63 MG/3ML nebulizer solution Take 3 mLs (0.63 mg total) by nebulization every 6 (six) hours as needed for wheezing. 12/04/18  Yes Fenton Foy, NP  albuterol (VENTOLIN HFA) 108 (90 Base) MCG/ACT inhaler Inhale 2 puffs into the lungs every 6 (six) hours as needed for wheezing or shortness of breath.    Yes [provider]  allopurinol (ZYLOPRIM) 300 MG tablet Take 1 tablet (300 mg total) by mouth daily. 03/28/21  Yes Silverio Decamp, MD  apixaban (ELIQUIS) 5 MG TABS tablet TAKE 1 TABLET BY MOUTH 2 TIMES DAILY. Patient taking differently: Take 5 mg by mouth 2 (two) times daily. 08/03/20 08/03/21 Yes Larey Dresser, MD  atorvastatin (LIPITOR) 40 MG tablet Take 1 tablet (40 mg total) by mouth at bedtime. 02/22/21 02/22/22 Yes Milford, Maricela Bo, FNP  blood glucose meter kit and supplies Dispense based on patient and insurance preference. Use as directed once a day. Dx Code E11.9. 09/06/17  Yes [name], Alferd Apa, DO  Blood Glucose Monitoring Suppl (ACCU-CHEK GUIDE) w/Device KIT 1 each by Does not apply route daily. DX Code: E11.9 09/19/17  Yes Roma Schanz R, DO  carvedilol (  COREG) 3.125 MG tablet Take 1 tablet (3.125 mg total) by mouth 2 (two) times daily. 07/27/20  Yes Larey Dresser, MD  glucose blood (ACCU-CHEK GUIDE) test strip Use as directed once a day.  Dx code: E11.9  09/19/17  Yes Ann Held, DO  guaiFENesin (MUCINEX) 600 MG 12 hr tablet Take 1 tablet (600 mg total) by mouth 2 (two) times daily as needed for to loosen phlegm. 08/17/17  Yes Shirley Friar, PA-C  metFORMIN (GLUCOPHAGE) 500 MG tablet Take 1 tablet (500 mg total) by mouth 2 (two) times daily with a meal. 10/20/20  Yes Shamleffer, Melanie Crazier, MD  nitroGLYCERIN (NITROSTAT) 0.4 MG SL tablet Place 1 tablet (0.4 mg total) under the tongue every 5 (five) minutes as needed for chest pain. 07/21/20  Yes Larey Dresser, MD  OXYGEN Inhale 3 L into the lungs continuous.   Yes [provider]  Pirfenidone (ESBRIET) 267 MG TABS Take 1 tab three times daily for 7 days, then 2 tabs three times daily for 7 days, then 3 tabs three times daily thereafter. Patient taking differently: Take 3 tablets by mouth daily. 03/02/21  Yes Brand Males, MD  predniSONE (DELTASONE) 10 MG tablet TAKE 1 TABLET (10 MG TOTAL) BY MOUTH DAILY WITH BREAKFAST. 02/23/21  Yes Larey Dresser, MD  sacubitril-valsartan (ENTRESTO) 24-26 MG TAKE 1 TABLET BY MOUTH 2 (TWO) TIMES DAILY. 04/06/20 04/30/21 Yes Larey Dresser, MD  spironolactone (ALDACTONE) 25 MG tablet Take 0.5 tablets (12.5 mg total) by mouth daily. 02/22/21 05/23/21 Yes Milford, Maricela Bo, FNP  torsemide (DEMADEX) 20 MG tablet Take 40 mg by mouth daily. 03/16/21  Yes [provider]  fluticasone-salmeterol (ADVAIR HFA) 230-21 MCG/ACT inhaler Inhale 2 puffs into the lungs 2 (two) times daily. Patient not taking: Reported on 04/17/2021 11/26/20   Martyn Ehrich, NP  torsemide 40 MG TABS Take 40 mg by mouth daily. Patient not taking: Reported on 04/11/2021 03/16/21   Rafael Bihari, FNP     Critical care time: [place], D.O.  Internal Medicine Resident, PGY-3 [facility]  Pager: [phone] 9:05 AM, 04/05/2021

## 2021-04-05 NOTE — Progress Notes (Signed)
[facility] Progress Note   Assessment/Plan **AKI on CKD:  Baseline Cr 1.5-2 CKD 3 now with AKI in setting of shock.  Suspect this is hemodynamically mediated AKI.  Unfortunately it is progressive and BUN > 100 now with worsening acidosis so will proceed with CRRT today; appreciate line placement by PCCM.  Preliminarily will start with gentle UF with CRRT as this appears more cardiogenic shock and volume overload.   **Syncope:  vol depletion vs cardiac in origin.  BP hasn't improved with volume expansion and concern for cardiac etiology.  TTE pending.  Currenlty on NE.  ICD is being interrogated today.   **HFrEF:  has chronic biV failure - concern for cardiogenic shock.  TTE pending and cardiology formally consulting.  CVP and Co-ox pending.  UF with CRRT can be titrated based on volume status data.   **Hyperkalemia:  K 5.1, use 4K dialysate for now.   **ILD  Will follow, call with concerns.   Jannifer Hick MD 04/05/2021, 1:32 PM  [facility] Pager: [phone] ------------------------------------------------------------------------------------------------- Subjective:   fatigued in ICU.  Having numerous procedures this AM - HD catheter, A line insertions.    Objective Vitals:   04/05/21 0615 04/05/21 0630 04/05/21 0645 04/05/21 0700  BP: (!) 87/65 (!) 82/63 103/75 114/77  Pulse: (!) 102 99 (!) 106 96  Resp: (!) 0 _0 Temp:      TempSrc:      SpO2: 100% 100% 99% 98%  Weight:      Height:       Physical Exam General: chronically ill appearing lying at 45 degrees in no distress Heart: RRR, no rub Lungs: clear ant basolateral crackles Abdomen: soft, nontender Extremities: no rash, toes appear mottled Dialysis Access: LIJ temp HD cath.   Additional Objective Labs: Basic Metabolic Panel: Recent Labs  Lab 04/13/2021 2140 04/05/21 0019 04/05/21 1255  NA 138 135 130*  K 5.1 5.4* 5.1  CL 104 99 97*  CO2 16* 16* 17*  GLUCOSE 125*  118* 209*  BUN 99* 106* 105*  CREATININE 4.88* 5.12* 5.30*  CALCIUM 7.9* 8.6* 8.1*  PHOS  --  7.8*  --    Liver Function Tests: Recent Labs  Lab 04/23/2021 2022 04/05/21 0019  AST 22  --   ALT 12  --   ALKPHOS 38  --   BILITOT 0.4  --   PROT 3.3*  --   ALBUMIN 1.7* 3.0*   No results for input(s): LIPASE, AMYLASE in the last 168 hours. CBC: Recent Labs  Lab 04/20/2021 1918 04/03/2021 1929 04/05/21 0019  WBC 6.5  --  6.4  NEUTROABS 4.9  --   --   HGB 11.5* 12.6* 11.1*  HCT 36.6* 37.0* 35.4*  MCV 128.0*  --  128.7*  PLT PLATELET CLUMPS NOTED ON SMEAR, UNABLE TO ESTIMATE  --  99*   Blood Culture    Component Value Date/Time   SDES BLOOD LEFT ANTECUBITAL 04/18/2021 1946   SDES BLOOD RIGHT FOREARM 04/21/2021 1946   SPECREQUEST  04/03/2021 1946    BOTTLES DRAWN AEROBIC AND ANAEROBIC Blood Culture results may not be optimal due to an inadequate volume of blood received in culture bottles   SPECREQUEST  04/07/2021 1946    BOTTLES DRAWN AEROBIC AND ANAEROBIC Blood Culture results may not be optimal due to an inadequate volume of blood received in culture bottles   CULT  04/03/2021 1946    NO GROWTH < 12 HOURS Performed at [facility], [place] [address], [place], [place] [address]    CULT  04/08/2021 1946    NO GROWTH < 12 HOURS Performed at [facility] [address], [place], [place] [address]    REPTSTATUS PENDING 04/13/2021 1946   REPTSTATUS PENDING 04/13/2021 1946    Cardiac Enzymes: No results for input(s): CKTOTAL, CKMB, CKMBINDEX, TROPONINI in the last 168 hours. CBG: Recent Labs  Lab 04/12/2021 2143 04/29/2021 2350 04/05/21 0019 04/05/21 0056 04/05/21 0331  GLUCAP 104* 34* 98 109* 105*   Iron Studies: No results for input(s): IRON, TIBC, TRANSFERRIN, FERRITIN in the last 72 hours. _0 @ Studies/Results: DG Chest 2 View  Result Date: 04/08/2021 CLINICAL DATA:  Syncope. EXAM: CHEST - 2 VIEW COMPARISON:  January 27, 2020 FINDINGS: Multiple  sternal wires and vascular clips are seen. A single lead ventricular pacer is noted. A radiopaque stimulator and associated stimulator wire are seen overlying the lateral aspect of the mid right lung. Mild atelectasis is seen within the bilateral lung bases. There is no evidence of a pleural effusion or pneumothorax. The heart size and mediastinal contours are within normal limits. The visualized skeletal structures are unremarkable. IMPRESSION: 1. Evidence of prior median sternotomy/CABG. 2. Mild bibasilar atelectasis. Electronically Signed   By: Virgina Norfolk M.D.   On: 04/18/2021 20:24   CT Head Wo Contrast  Result Date: 04/03/2021 CLINICAL DATA:  Status post trauma. EXAM: CT HEAD WITHOUT CONTRAST TECHNIQUE: Contiguous axial images were obtained from the base of the skull through the vertex without intravenous contrast. COMPARISON:  None. FINDINGS: Brain: There is mild cerebral atrophy with widening of the extra-axial spaces and ventricular dilatation. There are areas of decreased attenuation within the white matter tracts of the supratentorial brain, consistent with microvascular disease changes. Vascular: No hyperdense vessel or unexpected calcification. Skull: Normal. Negative for fracture or focal lesion. Sinuses/Orbits: There is marked severity right maxillary sinus, bilateral ethmoid sinus, sphenoid sinus and frontal sinus mucosal thickening. Postoperative changes are noted along the medial walls of the bilateral maxillary sinuses. Other: None. IMPRESSION: 1. No acute intracranial abnormality. 2. Generalized cerebral atrophy and microvascular disease changes of the supratentorial brain. 3. Marked severity paranasal sinus disease. Electronically Signed   By: Virgina Norfolk M.D.   On: 04/09/2021 20:50   CT Cervical Spine Wo Contrast  Result Date: 04/28/2021 CLINICAL DATA:  Status post trauma. EXAM: CT CERVICAL SPINE WITHOUT CONTRAST TECHNIQUE: Multidetector CT imaging of the cervical spine was  performed without intravenous contrast. Multiplanar CT image reconstructions were also generated. COMPARISON:  None. FINDINGS: Alignment: Normal. Skull base and vertebrae: No acute fracture. No primary bone lesion or focal pathologic process. Soft tissues and spinal canal: No prevertebral fluid or swelling. No visible canal hematoma. A metallic density stimulator wire is noted within the neck soft tissues on the right. Disc levels: Mild multilevel endplate sclerosis is seen throughout the cervical spine. There is mild to moderate severity narrowing of the anterior atlantoaxial articulation. Mild multilevel intervertebral disc space narrowing is seen. Moderate severity bilateral multilevel facet joint hypertrophy is noted. This is most prominent at the level of C2-C3. Upper chest: Negative. Other: None. IMPRESSION: 1. No acute osseous abnormality of the cervical spine. 2. Mild multilevel degenerative changes, as described above. Electronically Signed   By: Virgina Norfolk M.D.   On: 04/26/2021 20:53   DG CHEST PORT 1 VIEW  Result Date: 04/05/2021 CLINICAL DATA:  Left central line placement EXAM: PORTABLE CHEST - 1 VIEW COMPARISON:  the previous day's  study FINDINGS: Interval placement of left IJ dialysis catheter to the proximal aspect of the innominate vein. No pneumothorax. Stable left subclavian AICD. Slight increase in patchy interstitial and airspace opacities involving bases more than apices. Heart size upper limits normal. CABG markers. No definite effusion. Implanted stimulator device projects over the right hemithorax, leads directed cephalad. Sternotomy wires. IMPRESSION: 1. Left IJ catheter placement to the proximal innominate vein. No pneumothorax. 2. Slight worsening of bilateral infiltrates or edema. Electronically Signed   By: Lucrezia Europe M.D.   On: 04/05/2021 12:30   Medications:   prismasol BGK 4/2.5      prismasol BGK 4/2.5     sodium chloride     sodium chloride     cefTRIAXone  (ROCEPHIN)  IV     milrinone 0.25 mcg/kg/min (04/05/21 1115)   norepinephrine (LEVOPHED) Adult infusion 10 mcg/min (04/05/21 1054)   prismasol BGK 4/2.5      atorvastatin  40 mg Oral Daily   fentaNYL       hydrocortisone sod succinate (SOLU-CORTEF) inj  100 mg Intravenous Q8H   insulin aspart  0-9 Units Subcutaneous Q4H   mometasone-formoterol  2 puff Inhalation BID   pantoprazole (PROTONIX) IV  40 mg Intravenous Q12H

## 2021-04-05 NOTE — Consult Note (Addendum)
Advanced Heart Failure Team Consult Note   Primary Physician: Ann Held, DO PCP-Cardiologist:  Dr. [name]  EP: Dr. Caryl Comes   Reason for Consultation: Syncope and cardiogenic shock   HPI:    Vincent Chandler is seen today for evaluation of syncope and cardiogenic shock at the request of Dr. Tamala Julian, PCCM.   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   Per Dr. Caryl Comes, he is not a candidate for CRT-D upgrade.  Has IVCD, not LBBB-like. He had a Barostim device placed in 1/20.  Echo in 3/21 showed EF 20-25%, mild RV dilation with moderately decreased systolic function.   LHC/RHC in 1/22 showed low filling pressures, preserved cardiac output, moderate PAH; occluded SVG-OM and SVG-RCA with patent LIMA-LAD and SVG-D, occluded proximal RCA and mid LCx (LCx occlusion was new).  No interventional target.  Based on cath, it was suspected that dyspnea and fatigue was related mostly to his pulmonary fibrosis. He was tried on Tyvaso for PH-ILD, but he did not tolerate it (made his heart race).    Had [facility] f/u 8/22 and device interrogation showed VT with shock delivered several weeks prior. He had no memory of this. He was volume overloaded, diuretics titrated and arranged for EP follow up and device clinic monitoring. On return f/u 9/22, device interrogation showed no further VT. PAF noted, longest duration ~20 hrs. Volume status was improved, ReDs Clip 28%.   Now admitted for syncope. He reportedly had syncopal episode at home while walking to the bathroom.  Sat down in his bed and then woke up on the floor. Denies associated CP and no palpitations. Unsure if any ICD shocks. Reports several week h/o generalized malaise, LE weakness , dizziness and dark stools.   In ED, he was noted to be markedly hypotensive w/ AKI and  hyperkalemia. SBPs 60s, SCr 5.70  (baseline 1.5-2.0), K 5.4. Lactic acid 4.6. WBC nl at 6.5. Hgb stable at 12.6. CT of head and spine negative. Hs trop 353. COVID negative. PCT 1.62. CXR w/ mild bibasilar atelectasis. Also w/ marked hypomagnesemia at 1.2. Supp Mg given.   Initially felt to be volume depleted. Resuscitated w/ IVFs and started on NE, currently at 10 mcg. Hyperkalemia corrected w/ Lokelma.  Mg improved to 2.4 today.   Co-ox low at 41%. SCr trending down, 5.1 today but anuric. Cuff MAPs low 70s. Sinus tach on tele, low 100s.   Echo 09/2019   1. Left ventricular ejection fraction, by estimation, is 20 to 25%. The left ventricle has severely decreased function. The left ventricle demonstrates global hypokinesis. The left ventricular internal cavity size was mildly dilated. There is mild left ventricular hypertrophy. Left ventricular diastolic parameters are consistent with Grade I diastolic dysfunction (impaired relaxation). 2. Right ventricular systolic function is moderately reduced. The right ventricular size is mildly enlarged. Tricuspid regurgitation signal is inadequate for assessing PA pressure. 3. The mitral valve is normal in structure. Trivial mitral valve regurgitation. No evidence of mitral stenosis. 4. The aortic valve is tricuspid. Aortic valve regurgitation is not visualized. No aortic stenosis is present. 5. The inferior vena cava is normal in size with <50% respiratory variability, suggesting right atrial pressure of 8 mmHg.   Review of Systems: [y] = yes, _0  = no   General: Weight gain _1 ;  Weight loss _0 ; Anorexia [Y ]; Fatigue [ Y]; Fever _1 ; Chills _2 ; Weakness [ Y]  Cardiac: Chest pain/pressure _3 ; Resting SOB _4 ; Exertional SOB _5 ; Orthopnea _6 ; Pedal Edema _7 ; Palpitations _8 ; Syncope [Y ]; Presyncope _9 ; Paroxysmal nocturnal dyspnea_10   Pulmonary: Cough _11 ; Wheezing_12 ; Hemoptysis_13 ; Sputum _14 ; Snoring _15   GI: Vomiting_16 ; Dysphagia_17 ;  Melena_18 ; Hematochezia _19 ; Heartburn_20 ; Abdominal pain _21 ; Constipation _22 ; Diarrhea _23 ; BRBPR _24   GU: Hematuria_25 ; Dysuria _26 ; Nocturia_27   Vascular: Pain in legs with walking _28 ; Pain in feet with lying flat _29 ; Non-healing sores _30 ; Stroke _31 ; TIA _32 ; Slurred speech _33 ;  Neuro: Headaches_34 ; Vertigo_35 ; Seizures_36 ; Paresthesias_37 ;Blurred vision _38 ; Diplopia _39 ; Vision changes _40   Ortho/Skin: Arthritis _41 ; Joint pain _42 ; Muscle pain _43 ; Joint swelling _44 ; Back Pain _45 ; Rash _46   Psych: Depression_47 ; Anxiety_48   Heme: Bleeding problems _49 ; Clotting disorders _50 ; Anemia _51   Endocrine: Diabetes _52 ; Thyroid dysfunction_53   Home Medications Prior to Admission medications   Medication Sig Start Date End Date Taking? Authorizing Provider  ACCU-CHEK FASTCLIX LANCETS MISC Use as directed once a day.  Dx code: E11.9 09/19/17  Yes Ann Held, DO  acetaminophen (TYLENOL) 500 MG tablet Take 1,000 mg by mouth every 6 (six) hours as needed for moderate pain or headache.   Yes [provider]  albuterol (ACCUNEB) 0.63 MG/3ML nebulizer solution Take 3 mLs (0.63 mg total) by nebulization every 6 (six) hours as needed for wheezing. 12/04/18  Yes Fenton Foy, NP  albuterol (VENTOLIN HFA) 108 (90 Base) MCG/ACT inhaler Inhale 2 puffs into the lungs every 6 (six) hours as needed for wheezing or shortness of breath.    Yes [provider]  allopurinol (ZYLOPRIM) 300 MG tablet Take 1 tablet (300 mg total) by mouth daily. 03/28/21  Yes Silverio Decamp, MD  apixaban (ELIQUIS) 5 MG TABS tablet TAKE 1 TABLET BY MOUTH 2 TIMES DAILY. Patient taking differently: Take 5 mg by mouth 2 (two) times daily. 08/03/20 08/03/21 Yes Larey Dresser, MD  atorvastatin (LIPITOR) 40 MG tablet Take 1 tablet (40 mg total) by mouth at bedtime. 02/22/21 02/22/22 Yes Milford, Maricela Bo, FNP  blood glucose meter kit and supplies Dispense based on patient and insurance preference. Use  as directed once a day. Dx Code E11.9. 09/06/17  Yes [name], Alferd Apa, DO  Blood Glucose Monitoring Suppl (ACCU-CHEK GUIDE) w/Device KIT 1 each by Does not apply route daily. DX Code: E11.9 09/19/17  Yes Lowne Lyndal Pulley R, DO  carvedilol (COREG) 3.125 MG tablet Take 1 tablet (3.125 mg total) by mouth 2 (two) times daily. 07/27/20  Yes Larey Dresser, MD  glucose blood (ACCU-CHEK GUIDE) test strip Use as directed once a day.  Dx code: E11.9 09/19/17  Yes Ann Held, DO  guaiFENesin (MUCINEX) 600 MG 12 hr tablet Take 1 tablet (600 mg total) by mouth 2 (two) times daily as needed for to loosen phlegm. 08/17/17  Yes Shirley Friar, PA-C  metFORMIN (GLUCOPHAGE) 500 MG tablet Take 1 tablet (500 mg total) by mouth 2 (two) times daily with a meal. 10/20/20  Yes Shamleffer, Melanie Crazier, MD  nitroGLYCERIN (NITROSTAT) 0.4 MG SL tablet Place 1 tablet (0.4  mg total) under the tongue every 5 (five) minutes as needed for chest pain. 07/21/20  Yes Larey Dresser, MD  OXYGEN Inhale 3 L into the lungs continuous.   Yes [provider]  Pirfenidone (ESBRIET) 267 MG TABS Take 1 tab three times daily for 7 days, then 2 tabs three times daily for 7 days, then 3 tabs three times daily thereafter. Patient taking differently: Take 3 tablets by mouth daily. 03/02/21  Yes Brand Males, MD  predniSONE (DELTASONE) 10 MG tablet TAKE 1 TABLET (10 MG TOTAL) BY MOUTH DAILY WITH BREAKFAST. 02/23/21  Yes Larey Dresser, MD  sacubitril-valsartan (ENTRESTO) 24-26 MG TAKE 1 TABLET BY MOUTH 2 (TWO) TIMES DAILY. 04/06/20 04/30/21 Yes Larey Dresser, MD  spironolactone (ALDACTONE) 25 MG tablet Take 0.5 tablets (12.5 mg total) by mouth daily. 02/22/21 05/23/21 Yes Milford, Maricela Bo, FNP  torsemide (DEMADEX) 20 MG tablet Take 40 mg by mouth daily. 03/16/21  Yes [provider]  fluticasone-salmeterol (ADVAIR HFA) 230-21 MCG/ACT inhaler Inhale 2 puffs into the lungs 2 (two) times  daily. Patient not taking: Reported on 04/25/2021 11/26/20   Martyn Ehrich, NP  torsemide 40 MG TABS Take 40 mg by mouth daily. Patient not taking: Reported on 04/26/2021 03/16/21   Rafael Bihari, FNP    Past Medical History: Past Medical History:  Diagnosis Date   AICD (automatic cardioverter/defibrillator) present    Anginal pain ([place])    Anxiety    Asthma    Atrial fibrillation-postoperative 11/28/2012   Cardiomyopathy    alcohol use related   CHF (congestive heart failure) (HCC)    Chronic back pain    Coronary artery disease    drug eluting stent RCA 2005-EF 30%- s/p CABG x 4; 2/4 patent grafts with SVG-PLOM and SVG-RCA system totally occluded.  There are collaterals from the LAD system to the RCA and the RCA is totally occluded proximally.  There are no collaterals to the LCx territory.  He then underwent successful PCI of the mid left circumflex artery with overlapping Synergy drug-eluting stents   Depression    pt denies   Diabetes mellitus type II dx'd in the 1990's   GERD (gastroesophageal reflux disease)    yrs ago   H/O hiatal hernia    Hemorrhoids    History of hypogonadism    History of kidney stones    Hyperlipidemia    Hypotension    OSA on CPAP    "mask is broken; working on getting a new one" (01/15/2015; 10/03/2017)   Pneumonia    "3-4 times" (10/03/2017)   Urinary incontinence     Past Surgical History: Past Surgical History:  Procedure Laterality Date   A-FLUTTER ABLATION N/A 09/12/2017   Procedure: A-FLUTTER ABLATION;  Surgeon: Deboraha Sprang, MD;  Location: [place] [facility];  Service: Cardiovascular;  Laterality: N/A;   CARDIAC DEFIBRILLATOR PLACEMENT  01/15/2015   CARDIOVERSION N/A 08/10/2017   Procedure: CARDIOVERSION;  Surgeon: Larey Dresser, MD;  Location[facility] ENDOSCOPY;  Service: Cardiovascular;  Laterality: N/A;   CORONARY ANGIOPLASTY WITH STENT PLACEMENT  ~ 2003   CORONARY ARTERY BYPASS GRAFT N/A 08/15/2012   Procedure: CORONARY ARTERY  BYPASS GRAFTING (CABG);  Surgeon: Ivin Poot, MD;  Location: [place];  Service: Open Heart Surgery;  Laterality: N/A;  Coronary Artery Bypass Grafting Times Four Using Left Internal Mammary Artery and Right Saphenous Leg Vein Harvested Endoscopically   EP IMPLANTABLE DEVICE N/A 01/15/2015   Procedure: ICD Implant;  Surgeon:  Deboraha Sprang, MD;  Location: [place] [facility];  Service: Cardiovascular;  Laterality: N/A;   LEFT HEART CATHETERIZATION WITH CORONARY ANGIOGRAM N/A 08/08/2012   Procedure: LEFT HEART CATHETERIZATION WITH CORONARY ANGIOGRAM;  Surgeon: Peter M Martinique, MD;  Location: [facility] CATH LAB;  Service: Cardiovascular;  Laterality: N/A;   LEFT HEART CATHETERIZATION WITH CORONARY/GRAFT ANGIOGRAM N/A 07/21/2014   Procedure: LEFT HEART CATHETERIZATION WITH Beatrix Fetters;  Surgeon: Larey Dresser, MD;  Location: [facility] CATH LAB;  Service: Cardiovascular;  Laterality: N/A;   MULTIPLE EXTRACTIONS WITH ALVEOLOPLASTY N/A 10/04/2017   Procedure: Extraction of tooth #'s 5 and 14 with alveoloplasty and gross debridement of remaining teeth;  Surgeon: [name], DDS;  Location: [place];  Service: Oral Surgery;  Laterality: N/A;   PERCUTANEOUS CORONARY STENT INTERVENTION (PCI-S)  07/21/2014   Procedure: PERCUTANEOUS CORONARY STENT INTERVENTION (PCI-S);  Surgeon: Larey Dresser, MD;  Location: [facility] CATH LAB;  Service: Cardiovascular;;   REFRACTIVE SURGERY Bilateral 1990's   RIGHT HEART CATH N/A 10/03/2017   Procedure: RIGHT HEART CATH;  Surgeon: Larey Dresser, MD;  Location: [place] CV LAB;  Service: Cardiovascular;  Laterality: N/A;   RIGHT HEART CATH N/A 12/11/2018   Procedure: RIGHT HEART CATH;  Surgeon: Larey Dresser, MD;  Location: [place] CV LAB;  Service: Cardiovascular;  Laterality: N/A;   RIGHT/LEFT HEART CATH AND CORONARY/GRAFT ANGIOGRAPHY N/A 07/29/2020   Procedure: RIGHT/LEFT HEART CATH AND CORONARY/GRAFT ANGIOGRAPHY;  Surgeon: Larey Dresser, MD;  Location: [place] [facility]  LAB;  Service: Cardiovascular;  Laterality: N/A;   TEE WITHOUT CARDIOVERSION N/A 08/10/2017   Procedure: TRANSESOPHAGEAL ECHOCARDIOGRAM (TEE);  Surgeon: Larey Dresser, MD;  Location: [facility] ENDOSCOPY;  Service: Cardiovascular;  Laterality: N/A;   TONSILLECTOMY  ~ [place] Bilateral 11/28/2017   Procedure: VIDEO BRONCHOSCOPY WITHOUT FLUORO;  Surgeon: Brand Males, MD;  Location: [facility] ENDOSCOPY;  Service: Endoscopy;  Laterality: Bilateral;    Family History: Family History  Problem Relation Age of Onset   Hypertension Mother    Diabetes Mother    Hypertension Father    Diabetes Father    Hyperlipidemia Father     Social History: Social History   Socioeconomic History   Marital status: Divorced    Spouse name: n/a   Number of children: 0   Years of education: 12th grade   Highest education level: Not on file  Occupational History   Occupation: Data processing manager ---self employed    Employer: [employer]  Tobacco Use   Smoking status: Never   Smokeless tobacco: Current    Types: Chew  Vaping Use   Vaping Use: Never used  Substance and Sexual Activity   Alcohol use: Yes    Alcohol/week: 3.0 standard drinks    Types: 3 Glasses of wine per week   Drug use: No   Sexual activity: Not Currently    Partners: Female  Other Topics Concern   Not on file  Social History Narrative   Exercise-- walking    Lives alone.   Brother lives in [place], [place]   Social Determinants of Health   Financial Resource Strain: Not on file  Food Insecurity: Not on file  Transportation Needs: Not on file  Physical Activity: Not on file  Stress: Not on file  Social Connections: Not on file    Allergies:  No Known Allergies  Objective:    Vital Signs:   Temp:  [96.7 F (35.9 C)-97.5 F (36.4 C)] 97.4 F (  36.3 C) (10/04 0300) Pulse Rate:  [91-123] 96 (10/04 0700) Resp:  [0-29] 16 (10/04 0700) BP: (62-114)/(52-83) 114/77 (10/04 0700) SpO2:   [86 %-100 %] 98 % (10/04 0700) Weight:  [71.2 kg-75.1 kg] 75.1 kg (10/04 0500) Last BM Date: 04/14/2021  Weight change: Filed Weights   04/03/2021 1916 04/05/21 0500  Weight: 71.2 kg 75.1 kg    Intake/Output:   Intake/Output Summary (Last 24 hours) at 04/05/2021 0858 Last data filed at 04/05/2021 0800 Gross per 24 hour  Intake 1410.97 ml  Output --  Net 1410.97 ml      Physical Exam    General:  fatigued appearing middle aged WM No resp difficulty HEENT: normal Neck: supple. Distended neck veins, JVP elevated to ear. Lt HD cath. Carotids 2+ bilat; no bruits. No lymphadenopathy or thyromegaly appreciated. Cor: PMI nondisplaced. Regular rhythm, tachy rate. No rubs, gallops or murmurs. Lungs: clear Abdomen: soft, nontender, nondistended. No hepatosplenomegaly. No bruits or masses. Good bowel sounds. Extremities: no cyanosis, clubbing, rash, no edema, cool distal extremities, dusky toes  Neuro: alert & orientedx3, cranial nerves grossly intact. moves all 4 extremities w/o difficulty. Affect pleasant   Telemetry   Sinus tach, low 100s, personally reviewed   EKG    Poor quality tracing w/ artifact, appears sinus tach 104 bpm w/ Nonspecific IVCD. Will repeat   Labs   Basic Metabolic Panel: Recent Labs  Lab 04/09/2021 1929 04/07/2021 2022 05/02/2021 2140 04/05/21 0019  NA 127* 139 138 135  K 8.5* 3.6 5.1 5.4*  CL 105 121* 104 99  CO2  --  8* 16* 16*  GLUCOSE 101* 76 125* 118*  BUN >130* 68* 99* 106*  CREATININE 5.70* 3.01* 4.88* 5.12*  CALCIUM  --  4.8* 7.9* 8.6*  MG  --  1.2*  --  2.6*  PHOS  --   --   --  7.8*    Liver Function Tests: Recent Labs  Lab 04/07/2021 2022 04/05/21 0019  AST 22  --   ALT 12  --   ALKPHOS 38  --   BILITOT 0.4  --   PROT 3.3*  --   ALBUMIN 1.7* 3.0*   No results for input(s): LIPASE, AMYLASE in the last 168 hours. No results for input(s): AMMONIA in the last 168 hours.  CBC: Recent Labs  Lab 04/24/2021 1918 04/05/2021 1929  04/05/21 0019  WBC 6.5  --  6.4  NEUTROABS 4.9  --   --   HGB 11.5* 12.6* 11.1*  HCT 36.6* 37.0* 35.4*  MCV 128.0*  --  128.7*  PLT PLATELET CLUMPS NOTED ON SMEAR, UNABLE TO ESTIMATE  --  99*    Cardiac Enzymes: No results for input(s): CKTOTAL, CKMB, CKMBINDEX, TROPONINI in the last 168 hours.  BNP: BNP (last 3 results) Recent Labs    12/14/20 1255 02/15/21 1244 03/16/21 1422  BNP 768.9* 923.6* 1,237.2*    ProBNP (last 3 results) No results for input(s): PROBNP in the last 8760 hours.   CBG: Recent Labs  Lab 04/26/2021 2143 04/08/2021 2350 04/05/21 0019 04/05/21 0056 04/05/21 0331  GLUCAP 104* 34* 98 109* 105*    Coagulation Studies: No results for input(s): LABPROT, INR in the last 72 hours.   Imaging   DG Chest 2 View  Result Date: 04/05/2021 CLINICAL DATA:  Syncope. EXAM: CHEST - 2 VIEW COMPARISON:  January 27, 2020 FINDINGS: Multiple sternal wires and vascular clips are seen. A single lead ventricular pacer is noted. A radiopaque stimulator and associated stimulator  wire are seen overlying the lateral aspect of the mid right lung. Mild atelectasis is seen within the bilateral lung bases. There is no evidence of a pleural effusion or pneumothorax. The heart size and mediastinal contours are within normal limits. The visualized skeletal structures are unremarkable. IMPRESSION: 1. Evidence of prior median sternotomy/CABG. 2. Mild bibasilar atelectasis. Electronically Signed   By: Virgina Norfolk M.D.   On: 04/17/2021 20:24   CT Head Wo Contrast  Result Date: 04/10/2021 CLINICAL DATA:  Status post trauma. EXAM: CT HEAD WITHOUT CONTRAST TECHNIQUE: Contiguous axial images were obtained from the base of the skull through the vertex without intravenous contrast. COMPARISON:  None. FINDINGS: Brain: There is mild cerebral atrophy with widening of the extra-axial spaces and ventricular dilatation. There are areas of decreased attenuation within the white matter tracts of the  supratentorial brain, consistent with microvascular disease changes. Vascular: No hyperdense vessel or unexpected calcification. Skull: Normal. Negative for fracture or focal lesion. Sinuses/Orbits: There is marked severity right maxillary sinus, bilateral ethmoid sinus, sphenoid sinus and frontal sinus mucosal thickening. Postoperative changes are noted along the medial walls of the bilateral maxillary sinuses. Other: None. IMPRESSION: 1. No acute intracranial abnormality. 2. Generalized cerebral atrophy and microvascular disease changes of the supratentorial brain. 3. Marked severity paranasal sinus disease. Electronically Signed   By: Virgina Norfolk M.D.   On: 04/29/2021 20:50   CT Cervical Spine Wo Contrast  Result Date: 04/18/2021 CLINICAL DATA:  Status post trauma. EXAM: CT CERVICAL SPINE WITHOUT CONTRAST TECHNIQUE: Multidetector CT imaging of the cervical spine was performed without intravenous contrast. Multiplanar CT image reconstructions were also generated. COMPARISON:  None. FINDINGS: Alignment: Normal. Skull base and vertebrae: No acute fracture. No primary bone lesion or focal pathologic process. Soft tissues and spinal canal: No prevertebral fluid or swelling. No visible canal hematoma. A metallic density stimulator wire is noted within the neck soft tissues on the right. Disc levels: Mild multilevel endplate sclerosis is seen throughout the cervical spine. There is mild to moderate severity narrowing of the anterior atlantoaxial articulation. Mild multilevel intervertebral disc space narrowing is seen. Moderate severity bilateral multilevel facet joint hypertrophy is noted. This is most prominent at the level of C2-C3. Upper chest: Negative. Other: None. IMPRESSION: 1. No acute osseous abnormality of the cervical spine. 2. Mild multilevel degenerative changes, as described above. Electronically Signed   By: Virgina Norfolk M.D.   On: 04/29/2021 20:53     Medications:     Current  Medications:  fentaNYL       hydrocortisone sod succinate (SOLU-CORTEF) inj  100 mg Intravenous Q8H   insulin aspart  0-9 Units Subcutaneous Q4H   mometasone-formoterol  2 puff Inhalation BID   pantoprazole (PROTONIX) IV  40 mg Intravenous Q12H    Infusions:  sodium chloride     sodium chloride     cefTRIAXone (ROCEPHIN)  IV     dextrose 5 % and 0.9% NaCl 75 mL/hr at 04/05/21 0800   milrinone     norepinephrine (LEVOPHED) Adult infusion 10 mcg/min (04/05/21 0800)      Patient Profile   60 y/o male w/ chronic biventricular heart failure (EF 20-25%, RV moderately reduced), CAD w/ h/o CABG, atrial flutter s/p ablation, Afib, ICD w/ h/o VT, 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   Assessment/Plan   1. Cardiogenic Shock/ Acute on Chronic Biventricular Heart Failure:  - Most  recent echo 3/22: LVEF 20-25%, RV moderately reduced  - Hypotensive requiring NE w/ AKI and lactic acidosis, lactate 4.6 on admit  - Co-ox low at 42% on NE 10  - Start milrinone 0.25. Monitor BP closely. Avoiding DBA given concern for recent VT  - Markedly fluid overloaded w/ acute anuric renal failure, will need CVVHD for volume removal - Set up CVP monitoring - Follow Co-ox and lactate  - Repeat 2D Echo  - Cannot r/o septic shock component. PCT elevated but in setting of ARF. Continue empiric abx per PCCM. BCx NGTD  - Keep MAP >65. Needs A-line.  - May need mechanical support   2. Syncope: initially felt to be 2/2 volume depletion. Concern for cardiogenic syncope/ VT in the setting of electrolyte abnormalities, K 5.4, Mg markedly low at 1.2 on admit. Also w/ CGS. He has prior h/o VT w/ fairly recent occurrence 8/22, that he was unaware of and incidentally discovered on device check at routine clinic f/u.   - interrogate device. [employer] device rep notified - correct electrolyte abnormalities. Lower K but  keep >4.0. Keep Mg > 2.0  - cycle Hs trops (initial 353) - repeat echo  - continue NE for BP support - follow Co-ox   3. AKI on Stage IIIb CKD: SCr 5.70 on admit, baseline 1.5-2.0. Initially felt 2/2 volume depletion/hypotension.  Concern for hemodynamically unstable VT (see above). Also w/ CGS - suspect ATN/ Cardiorenal from low output  - Scr trending down, 5.12 today but anuric w/ marked fluid overload - will likely need CVVHD for volume removal until renal recovery  - continue NE for BP support, keep MAP > 65  4. Hyperkalemia: K 5.4 in setting of ARF - Lolkema given, repeat BMP this afternoon - Start CVVHD   5. Hypomagnesemia: Mg 1.2 on admit, corrected w/ IV Mg.  - Improved, 2.4 today  - monitor   6. CAD: H/o CABG. Last intervention was PCI to LCx in 2016. Repeat cath (1/22) with occluded SVG-OM and SVG-RCA with patent LIMA-LAD and SVG-D, occluded proximal RCA, and newly occluded mid LCX. No targets for revascularization. Denies recent ischemic CP. Hs trop 353 - cycle trops x 3 - add back home statin, LFTs nl  - not on ASA given use of Eliquis for PAF - no ? blocker w/ shock   7. H/o VT: see above. No longer on amio given h/o amio pulmonary toxicity  - has ICD. interrogate device today  - Keep K > 4.0, Mg > 2.0  - Monitor w/ dual inotropes   8. H/o Afib/ Aflutter: s/p Aflutter ablation. On Eliquis PTA, currently on hold given recent reports of dark stools. FOBT pending. Sinus tach currently, low 100s  - monitor closely while on dual inotropes   - off amio due to pulmonary toxicity   9. ILD: 2/2 amio toxicity, on chronic prednisone - followed by pulmonology, PCCM onboard    Length of Stay: 1  Nelida Gores  04/05/2021, 8:58 AM  Advanced Heart Failure Team Pager [phone] (M-F; 7a - 5p)  Please contact [facility] for night-coverage after hours (4p -7a ) and weekends on [email or web address]  Agree with above.   60 y/o male with CAD s/p CABG, PAF s/p previous AFL  ablation, CKD 3b, amio-induced ILD amd systolic HF due to iCM EF 20-25% on echo 3/21 admitted with syncope in setting of profound cardiogenic shock.   ICD interrogation shows frequent NSVT but no sustained episodes. + AF of unclear duration.  Initial co-ox 41%. Hstrop 353 -> 3,879 -> 3,980.   Now with MSOF. SCr up to 5.0.  Starting CVVHD. Lactic acid 4.6 -> 3.7 -> 3.2  On NE 16 and milrinone 0.25  General:  Ill appearing. No resp difficulty HEENT: normal + ecchymosis over left eye Neck: supple. JVP 12-13 Carotids 2+ bilat; no bruits. No lymphadenopathy or thryomegaly appreciated. Cor: PMI nondisplaced. Irregular tachy No rubs, gallops or murmurs. Lungs: clear Abdomen: soft, nontender, nondistended. No hepatosplenomegaly. No bruits or masses. Good bowel sounds. Extremities: no cyanosis, clubbing, rash, tr-1+ edema Neuro: alert & orientedx3, cranial nerves grossly intact. moves all 4 extremities w/o difficulty. Affect pleasant  He has profound cardiogenic shock possibly due to recurrent AF. Course now c/b by MSOF. Hstrop rising but suspect demand ischemia rather than primary ACS.   Repeat echo.   Would continue to titrate NE and milrinone to get co-ox >= 55%. (Repeat co-ox and lactic acid being sent now) If unable to improve output may need to consider mechanical support. Pull fluid as tolerated with CVVHD. No good options for AF with h/o amio lung toxicity.   With rising trops and AF would favor heparin for [facility] if no clear contraindications.   CRITICAL CARE Performed by: Glori Bickers  Total critical care time: 45 minutes  Critical care time was exclusive of separately billable procedures and treating other patients.  Critical care was necessary to treat or prevent imminent or life-threatening deterioration.  Critical care was time spent personally by me (independent of midlevel providers or residents) on the following activities: development of treatment plan with patient  and/or surrogate as well as nursing, discussions with consultants, evaluation of patient's response to treatment, examination of patient, obtaining history from patient or surrogate, ordering and performing treatments and interventions, ordering and review of laboratory studies, ordering and review of radiographic studies, pulse oximetry and re-evaluation of patient's condition.  Glori Bickers, MD  3:00 PM

## 2021-04-05 NOTE — Consult Note (Addendum)
[facility] Nurse Consult Note: Patient receiving care in [facility] [id number] [id number] Reason for Consult: Sacral wound DTI on admission Wound type: Reddened area on the sacrum that is blanchable.  Pressure Injury POA: NA Measurement: Deferred Wound bed: Blanchable redness Drainage (amount, consistency, odor) None Periwound: intact Dressing procedure/placement/frequency: Apply sacral foam dressing to the area and offload/turn q 2 hours. Monitor the area for any evolvement q shift.  Pressure Injury Prevention Bundle May use any that apply to this patient. Support surfaces (air mattress) chair cushion Kellie Simmering # [phone]) Heel offloading boots Kellie Simmering # [phone]) Turning and Positioning  Measures to reduce shear (draw sheet, knees up) Skin protection Products (Foam dressing) Moisture management products (Critic-Aid Barrier Cream (Purple top) Sween moisturizing lotion (Pink top in clean supply) Nutrition Management Protection for Medical Devices Routine Skin Assessment   Thank you for the consult. [place] nurse will not follow at this time.   Please re-consult the [place] team if needed.  Cathlean Marseilles Tamala Julian, MSN, RN, [place], Lysle Pearl, [facility] Wound Treatment Associate Pager [phone]

## 2021-04-06 ENCOUNTER — Inpatient Hospital Stay (HOSPITAL_COMMUNITY): Payer: Medicare HMO

## 2021-04-06 ENCOUNTER — Other Ambulatory Visit (HOSPITAL_COMMUNITY): Payer: Medicare HMO

## 2021-04-06 DIAGNOSIS — I5022 Chronic systolic (congestive) heart failure: Secondary | ICD-10-CM

## 2021-04-06 DIAGNOSIS — N179 Acute kidney failure, unspecified: Secondary | ICD-10-CM | POA: Diagnosis not present

## 2021-04-06 DIAGNOSIS — I5023 Acute on chronic systolic (congestive) heart failure: Secondary | ICD-10-CM | POA: Diagnosis not present

## 2021-04-06 DIAGNOSIS — R570 Cardiogenic shock: Secondary | ICD-10-CM | POA: Diagnosis not present

## 2021-04-06 DIAGNOSIS — N178 Other acute kidney failure: Secondary | ICD-10-CM | POA: Diagnosis not present

## 2021-04-06 DIAGNOSIS — M7989 Other specified soft tissue disorders: Secondary | ICD-10-CM

## 2021-04-06 LAB — RENAL FUNCTION PANEL
Albumin: 2.8 g/dL — ABNORMAL LOW (ref 3.5–5.0)
Albumin: 2.7 g/dL — ABNORMAL LOW (ref 3.5–5.0)
Anion gap: 14 (ref 5–15)
Anion gap: 12 (ref 5–15)
BUN: 98 mg/dL — ABNORMAL HIGH (ref 6–20)
BUN: 73 mg/dL — ABNORMAL HIGH (ref 6–20)
CO2: 19 mmol/L — ABNORMAL LOW (ref 22–32)
CO2: 20 mmol/L — ABNORMAL LOW (ref 22–32)
Calcium: 7.8 mg/dL — ABNORMAL LOW (ref 8.9–10.3)
Calcium: 7.6 mg/dL — ABNORMAL LOW (ref 8.9–10.3)
Chloride: 99 mmol/L (ref 98–111)
Chloride: 102 mmol/L (ref 98–111)
Creatinine, Ser: 5.29 mg/dL — ABNORMAL HIGH (ref 0.61–1.24)
Creatinine, Ser: 4.03 mg/dL — ABNORMAL HIGH (ref 0.61–1.24)
GFR, Estimated: 12 mL/min — ABNORMAL LOW (ref >60)
GFR, Estimated: 16 mL/min — ABNORMAL LOW (ref >60)
Glucose, Bld: 189 mg/dL — ABNORMAL HIGH (ref 70–99)
Glucose, Bld: 181 mg/dL — ABNORMAL HIGH (ref 70–99)
Phosphorus: 7.5 mg/dL — ABNORMAL HIGH (ref 2.5–4.6)
Phosphorus: 5.5 mg/dL — ABNORMAL HIGH (ref 2.5–4.6)
Potassium: 5.1 mmol/L (ref 3.5–5.1)
Potassium: 4.9 mmol/L (ref 3.5–5.1)
Sodium: 132 mmol/L — ABNORMAL LOW (ref 135–145)
Sodium: 134 mmol/L — ABNORMAL LOW (ref 135–145)

## 2021-04-06 LAB — GLUCOSE, CAPILLARY
Glucose-Capillary: 187 mg/dL — ABNORMAL HIGH (ref 70–99)
Glucose-Capillary: 168 mg/dL — ABNORMAL HIGH (ref 70–99)
Glucose-Capillary: 143 mg/dL — ABNORMAL HIGH (ref 70–99)
Glucose-Capillary: 142 mg/dL — ABNORMAL HIGH (ref 70–99)
Glucose-Capillary: 189 mg/dL — ABNORMAL HIGH (ref 70–99)
Glucose-Capillary: 187 mg/dL — ABNORMAL HIGH (ref 70–99)

## 2021-04-06 LAB — COOXEMETRY PANEL
Carboxyhemoglobin: 1.6 % — ABNORMAL HIGH (ref 0.5–1.5)
Carboxyhemoglobin: 1.5 % (ref 0.5–1.5)
Carboxyhemoglobin: 1.4 % (ref 0.5–1.5)
Carboxyhemoglobin: 1.6 % — ABNORMAL HIGH (ref 0.5–1.5)
Methemoglobin: 1.1 % (ref 0.0–1.5)
Methemoglobin: 1.1 % (ref 0.0–1.5)
Methemoglobin: 1 % (ref 0.0–1.5)
Methemoglobin: 0.9 % (ref 0.0–1.5)
O2 Saturation: 98.7 %
O2 Saturation: 98.6 %
O2 Saturation: 96.3 %
O2 Saturation: 64.7 %
Total hemoglobin: 9.4 g/dL — ABNORMAL LOW (ref 12.0–16.0)
Total hemoglobin: 9.1 g/dL — ABNORMAL LOW (ref 12.0–16.0)
Total hemoglobin: 9.2 g/dL — ABNORMAL LOW (ref 12.0–16.0)
Total hemoglobin: 6.2 g/dL — CL (ref 12.0–16.0)

## 2021-04-06 LAB — CBC
HCT: 29.2 % — ABNORMAL LOW (ref 39.0–52.0)
HCT: 26.5 % — ABNORMAL LOW (ref 39.0–52.0)
HCT: 25.2 % — ABNORMAL LOW (ref 39.0–52.0)
HCT: 23 % — ABNORMAL LOW (ref 39.0–52.0)
Hemoglobin: 9.4 g/dL — ABNORMAL LOW (ref 13.0–17.0)
Hemoglobin: 8.7 g/dL — ABNORMAL LOW (ref 13.0–17.0)
Hemoglobin: 8.1 g/dL — ABNORMAL LOW (ref 13.0–17.0)
Hemoglobin: 7.5 g/dL — ABNORMAL LOW (ref 13.0–17.0)
MCH: 39.2 pg — ABNORMAL HIGH (ref 26.0–34.0)
MCH: 39.4 pg — ABNORMAL HIGH (ref 26.0–34.0)
MCH: 38.8 pg — ABNORMAL HIGH (ref 26.0–34.0)
MCH: 38.7 pg — ABNORMAL HIGH (ref 26.0–34.0)
MCHC: 32.2 g/dL (ref 30.0–36.0)
MCHC: 32.8 g/dL (ref 30.0–36.0)
MCHC: 32.1 g/dL (ref 30.0–36.0)
MCHC: 32.6 g/dL (ref 30.0–36.0)
MCV: 121.7 fL — ABNORMAL HIGH (ref 80.0–100.0)
MCV: 119.9 fL — ABNORMAL HIGH (ref 80.0–100.0)
MCV: 120.6 fL — ABNORMAL HIGH (ref 80.0–100.0)
MCV: 118.6 fL — ABNORMAL HIGH (ref 80.0–100.0)
Platelets: 97 10*3/uL — ABNORMAL LOW (ref 150–400)
Platelets: 94 10*3/uL — ABNORMAL LOW (ref 150–400)
Platelets: 88 10*3/uL — ABNORMAL LOW (ref 150–400)
Platelets: 81 10*3/uL — ABNORMAL LOW (ref 150–400)
RBC: 2.4 MIL/uL — ABNORMAL LOW (ref 4.22–5.81)
RBC: 2.21 MIL/uL — ABNORMAL LOW (ref 4.22–5.81)
RBC: 2.09 MIL/uL — ABNORMAL LOW (ref 4.22–5.81)
RBC: 1.94 MIL/uL — ABNORMAL LOW (ref 4.22–5.81)
RDW: 16.1 % — ABNORMAL HIGH (ref 11.5–15.5)
RDW: 15.9 % — ABNORMAL HIGH (ref 11.5–15.5)
RDW: 15.6 % — ABNORMAL HIGH (ref 11.5–15.5)
RDW: 15.8 % — ABNORMAL HIGH (ref 11.5–15.5)
WBC: 5.4 10*3/uL (ref 4.0–10.5)
WBC: 4.8 10*3/uL (ref 4.0–10.5)
WBC: 4.6 10*3/uL (ref 4.0–10.5)
WBC: 4.4 10*3/uL (ref 4.0–10.5)
nRBC: 1.1 % — ABNORMAL HIGH (ref 0.0–0.2)
nRBC: 0.8 % — ABNORMAL HIGH (ref 0.0–0.2)
nRBC: 0.9 % — ABNORMAL HIGH (ref 0.0–0.2)
nRBC: 0.7 % — ABNORMAL HIGH (ref 0.0–0.2)

## 2021-04-06 LAB — ECHOCARDIOGRAM COMPLETE
AR max vel: 2.91 cm2
AV Area VTI: 2.78 cm2
AV Area mean vel: 2.97 cm2
AV Mean grad: 2 mmHg
AV Peak grad: 4.2 mmHg
Ao pk vel: 1.02 m/s
Area-P 1/2: 5.2 cm2
Height: 69 in
S' Lateral: 5 cm
Single Plane A4C EF: 14.7 %
Weight: 2652.57 oz

## 2021-04-06 LAB — COMPREHENSIVE METABOLIC PANEL
ALT: 22 U/L (ref 0–44)
AST: 33 U/L (ref 15–41)
Albumin: 2.7 g/dL — ABNORMAL LOW (ref 3.5–5.0)
Alkaline Phosphatase: 61 U/L (ref 38–126)
Anion gap: 13 (ref 5–15)
BUN: 98 mg/dL — ABNORMAL HIGH (ref 6–20)
CO2: 16 mmol/L — ABNORMAL LOW (ref 22–32)
Calcium: 7.4 mg/dL — ABNORMAL LOW (ref 8.9–10.3)
Chloride: 101 mmol/L (ref 98–111)
Creatinine, Ser: 5.25 mg/dL — ABNORMAL HIGH (ref 0.61–1.24)
GFR, Estimated: 12 mL/min — ABNORMAL LOW (ref >60)
Glucose, Bld: 226 mg/dL — ABNORMAL HIGH (ref 70–99)
Potassium: 5.2 mmol/L — ABNORMAL HIGH (ref 3.5–5.1)
Sodium: 130 mmol/L — ABNORMAL LOW (ref 135–145)
Total Bilirubin: 0.9 mg/dL (ref 0.3–1.2)
Total Protein: 5.4 g/dL — ABNORMAL LOW (ref 6.5–8.1)

## 2021-04-06 LAB — MAGNESIUM: Magnesium: 2.2 mg/dL (ref 1.7–2.4)

## 2021-04-06 LAB — PROCALCITONIN: Procalcitonin: 1.5 ng/mL

## 2021-04-06 LAB — PROTIME-INR
INR: 1.4 — ABNORMAL HIGH (ref 0.8–1.2)
Prothrombin Time: 17.1 seconds — ABNORMAL HIGH (ref 11.4–15.2)

## 2021-04-06 LAB — FERRITIN: Ferritin: 355 ng/mL — ABNORMAL HIGH (ref 24–336)

## 2021-04-06 MED ORDER — SODIUM ZIRCONIUM CYCLOSILICATE 10 G PO PACK
10.0000 g | PACK | Freq: Once | ORAL | Status: AC
  Administered 2021-04-06: 10 g via ORAL
  Filled 2021-04-06: qty 1

## 2021-04-06 MED ORDER — HEPARIN SODIUM (PORCINE) 5000 UNIT/ML IJ SOLN
5000.0000 [IU] | Freq: Three times a day (TID) | INTRAMUSCULAR | Status: DC
  Administered 2021-04-06 – 2021-04-08 (×6): 5000 [IU] via SUBCUTANEOUS
  Filled 2021-04-06 (×6): qty 1

## 2021-04-06 MED ORDER — ALTEPLASE 2 MG IJ SOLR
2.0000 mg | Freq: Once | INTRAMUSCULAR | Status: DC
  Filled 2021-04-06: qty 2

## 2021-04-06 MED ORDER — FENTANYL CITRATE (PF) 100 MCG/2ML IJ SOLN
INTRAMUSCULAR | Status: AC
  Administered 2021-04-06: 50 ug
  Filled 2021-04-06: qty 2

## 2021-04-06 MED ORDER — ORAL CARE MOUTH RINSE
15.0000 mL | Freq: Two times a day (BID) | OROMUCOSAL | Status: DC
  Administered 2021-04-06 – 2021-04-14 (×18): 15 mL via OROMUCOSAL

## 2021-04-06 MED ORDER — PERFLUTREN LIPID MICROSPHERE
1.0000 mL | INTRAVENOUS | Status: AC | PRN
  Administered 2021-04-06: 2 mL via INTRAVENOUS
  Filled 2021-04-06: qty 10

## 2021-04-06 MED ORDER — VASOPRESSIN 20 UNITS/100 ML INFUSION FOR SHOCK
0.0000 [IU]/min | INTRAVENOUS | Status: DC
Start: 2021-04-06 — End: 2021-04-14
  Administered 2021-04-06 – 2021-04-12 (×14): 0.03 [IU]/min via INTRAVENOUS
  Administered 2021-04-13: 0.02 [IU]/min via INTRAVENOUS
  Administered 2021-04-14: 0.03 [IU]/min via INTRAVENOUS
  Filled 2021-04-06 (×18): qty 100

## 2021-04-06 NOTE — Progress Notes (Signed)
Pt states they cannot breathe through the CPAP mask. Attempted to bleed in O2 but pt wanted the mask removed. Currently on 3L with bubble humidity. RT will cont to monitor.

## 2021-04-06 NOTE — Progress Notes (Signed)
2D echocardiogram with Definity completed.  04/06/2021 3:10 PM Kelby Aline., MHA, RVT, RDCS, RDMS

## 2021-04-06 NOTE — Progress Notes (Signed)
[facility] Progress Note   Assessment/Plan **AKI on CKD:  Baseline Cr 1.5-2 CKD 3 now with severe AKI in setting of shock.  Suspect this is hemodynamically mediated AKI c/w cardiorenal.  Unfortunately it is progressive and BUN > 100 so proceeded 10/4 to CRRT which was not able to run due to catheter malfunction.  D/w PCCM today and plans to replace catheter and resume CRRT.    **Syncope/shock:  vol depletion + cardiogenicdiac in origin.  ICD interrogation wth no VT.    **HFrEF:  has chronic biV failure - compnent of cardiogenic shock and now on milrinone with improved hemodynamics. Cardiology formally consulting.  CVP 10 and Co-ox improved to > 90%, lactate 1.5 last PM too.  UF with CRRT as tolerated - would aim for gentle UF today.  Certainly can use diuretics as needed as well given fairly good UOP.   **Hyperkalemia:  K 5.1, use 4K dialysate for now.   **ILD  **A fib: eliquis on hold for concern for GIB.   Will follow, call with concerns.   Jannifer Hick MD 04/05/2021, 1:32 PM  [facility] Pager: [phone] ------------------------------------------------------------------------------------------------- Subjective:   CRRT limited by catheter malfunction overnight - 4 filters were attempted and RN reporting unable to rinse blood back each time.  On NE and milrinone now.  Pt is fatigued but hungry this AM. I/Os 1 / 0.8L UOP.  Net +1.6 for admission.   Objective Vitals:   04/06/21 0915 04/06/21 0930 04/06/21 0945 04/06/21 1000  BP:      Pulse: (!) 110 (!) 122 (!) 116 (!) 111  Resp: (!) 0 _0 Temp:      TempSrc:      SpO2: 100% 96% 99% 99%  Weight:      Height:       Physical Exam General: chronically ill appearing lying at 45 degrees in no distress Heart: RRR, no rub Lungs: clear ant basolateral crackles Abdomen: soft, nontender Extremities: no rash, toes appear less mottled. 1+ pitting diffuse edema Dialysis Access: LIJ temp HD  cath.   Additional Objective Labs: Basic Metabolic Panel: Recent Labs  Lab 04/05/21 0019 04/05/21 1255 04/05/21 1800 04/06/21 0130 04/06/21 0500  NA 135   < > 132* 130* 132*  K 5.4*   < > 5.4* 5.2* 5.1  CL 99   < > 97* 101 99  CO2 16*   < > 16* 16* 19*  GLUCOSE 118*   < > 179* 226* 189*  BUN 106*   < > 103* 98* 98*  CREATININE 5.12*   < > 5.51* 5.25* 5.29*  CALCIUM 8.6*   < > 8.4* 7.4* 7.8*  PHOS 7.8*  --  7.7*  --  7.5*   < > = values in this interval not displayed.    Liver Function Tests: Recent Labs  Lab 04/09/2021 2022 04/05/21 0019 04/05/21 1800 04/06/21 0130 04/06/21 0500  AST 22  --   --  33  --   ALT 12  --   --  22  --   ALKPHOS 38  --   --  61  --   BILITOT 0.4  --   --  0.9  --   PROT 3.3*  --   --  5.4*  --   ALBUMIN 1.7*   < > 3.0* 2.7* 2.8*   < > = values in this interval not displayed.    No results for input(s): LIPASE, AMYLASE in the last  168 hours. CBC: Recent Labs  Lab 04/03/2021 1918 04/06/2021 1929 04/05/21 0019 04/06/21 0130 04/06/21 0946  WBC 6.5  --  6.4 5.4 4.8  NEUTROABS 4.9  --   --   --   --   HGB 11.5*   < > 11.1* 9.4* 8.7*  HCT 36.6*   < > 35.4* 29.2* 26.5*  MCV 128.0*  --  128.7* 121.7* 119.9*  PLT PLATELET CLUMPS NOTED ON SMEAR, UNABLE TO ESTIMATE  --  99* 97* 94*   < > = values in this interval not displayed.    Blood Culture    Component Value Date/Time   SDES BLOOD LEFT ANTECUBITAL 04/25/2021 1946   SDES BLOOD RIGHT FOREARM 04/29/2021 1946   SPECREQUEST  04/19/2021 1946    BOTTLES DRAWN AEROBIC AND ANAEROBIC Blood Culture results may not be optimal due to an inadequate volume of blood received in culture bottles   SPECREQUEST  04/12/2021 1946    BOTTLES DRAWN AEROBIC AND ANAEROBIC Blood Culture results may not be optimal due to an inadequate volume of blood received in culture bottles   CULT  04/18/2021 1946    NO GROWTH 2 DAYS Performed at [facility], [place] [address] [place], [place] [address]    CULT   04/25/2021 1946    NO GROWTH 2 DAYS Performed at [place] [address], [place],  [address]    REPTSTATUS PENDING 04/28/2021 1946   REPTSTATUS PENDING 04/23/2021 1946    Cardiac Enzymes: No results for input(s): CKTOTAL, CKMB, CKMBINDEX, TROPONINI in the last 168 hours. CBG: Recent Labs  Lab 04/05/21 1555 04/05/21 1934 04/05/21 2341 04/06/21 0329 04/06/21 0806  GLUCAP 158* 166* 207* 187* 168*    Iron Studies:  Recent Labs    04/06/21 0130  FERRITIN 355*   _0 @ Studies/Results: DG Chest 2 View  Result Date: 04/12/2021 CLINICAL DATA:  Syncope. EXAM: CHEST - 2 VIEW COMPARISON:  January 27, 2020 FINDINGS: Multiple sternal wires and vascular clips are seen. A single lead ventricular pacer is noted. A radiopaque stimulator and associated stimulator wire are seen overlying the lateral aspect of the mid right lung. Mild atelectasis is seen within the bilateral lung bases. There is no evidence of a pleural effusion or pneumothorax. The heart size and mediastinal contours are within normal limits. The visualized skeletal structures are unremarkable. IMPRESSION: 1. Evidence of prior median sternotomy/CABG. 2. Mild bibasilar atelectasis. Electronically Signed   By: Virgina Norfolk M.D.   On: 04/13/2021 20:24   CT Head Wo Contrast  Result Date: 04/21/2021 CLINICAL DATA:  Status post trauma. EXAM: CT HEAD WITHOUT CONTRAST TECHNIQUE: Contiguous axial images were obtained from the base of the skull through the vertex without intravenous contrast. COMPARISON:  None. FINDINGS: Brain: There is mild cerebral atrophy with widening of the extra-axial spaces and ventricular dilatation. There are areas of decreased attenuation within the white matter tracts of the supratentorial brain, consistent with microvascular disease changes. Vascular: No hyperdense vessel or unexpected calcification. Skull: Normal. Negative for fracture or focal lesion. Sinuses/Orbits: There is marked  severity right maxillary sinus, bilateral ethmoid sinus, sphenoid sinus and frontal sinus mucosal thickening. Postoperative changes are noted along the medial walls of the bilateral maxillary sinuses. Other: None. IMPRESSION: 1. No acute intracranial abnormality. 2. Generalized cerebral atrophy and microvascular disease changes of the supratentorial brain. 3. Marked severity paranasal sinus disease. Electronically Signed   By: Virgina Norfolk M.D.   On: 04/22/2021 20:50   CT Cervical  Spine Wo Contrast  Result Date: 04/22/2021 CLINICAL DATA:  Status post trauma. EXAM: CT CERVICAL SPINE WITHOUT CONTRAST TECHNIQUE: Multidetector CT imaging of the cervical spine was performed without intravenous contrast. Multiplanar CT image reconstructions were also generated. COMPARISON:  None. FINDINGS: Alignment: Normal. Skull base and vertebrae: No acute fracture. No primary bone lesion or focal pathologic process. Soft tissues and spinal canal: No prevertebral fluid or swelling. No visible canal hematoma. A metallic density stimulator wire is noted within the neck soft tissues on the right. Disc levels: Mild multilevel endplate sclerosis is seen throughout the cervical spine. There is mild to moderate severity narrowing of the anterior atlantoaxial articulation. Mild multilevel intervertebral disc space narrowing is seen. Moderate severity bilateral multilevel facet joint hypertrophy is noted. This is most prominent at the level of C2-C3. Upper chest: Negative. Other: None. IMPRESSION: 1. No acute osseous abnormality of the cervical spine. 2. Mild multilevel degenerative changes, as described above. Electronically Signed   By: Virgina Norfolk M.D.   On: 04/26/2021 20:53   DG CHEST PORT 1 VIEW  Result Date: 04/05/2021 CLINICAL DATA:  Left central line placement EXAM: PORTABLE CHEST - 1 VIEW COMPARISON:  the previous day's study FINDINGS: Interval placement of left IJ dialysis catheter to the proximal aspect of the  innominate vein. No pneumothorax. Stable left subclavian AICD. Slight increase in patchy interstitial and airspace opacities involving bases more than apices. Heart size upper limits normal. CABG markers. No definite effusion. Implanted stimulator device projects over the right hemithorax, leads directed cephalad. Sternotomy wires. IMPRESSION: 1. Left IJ catheter placement to the proximal innominate vein. No pneumothorax. 2. Slight worsening of bilateral infiltrates or edema. Electronically Signed   By: Lucrezia Europe M.D.   On: 04/05/2021 12:30   Medications:   prismasol BGK 4/2.5 Stopped (04/06/21 0130)    prismasol BGK 4/2.5 Stopped (04/05/21 2030)   sodium chloride     sodium chloride     cefTRIAXone (ROCEPHIN)  IV Stopped (04/05/21 1800)   milrinone 0.25 mcg/kg/min (04/06/21 0900)   norepinephrine (LEVOPHED) Adult infusion 42 mcg/min (04/06/21 0900)   prismasol BGK 4/2.5 Stopped (04/05/21 2030)   vasopressin 0.03 Units/min (04/06/21 1113)    alteplase  2 mg Intracatheter Once   alteplase  2 mg Intracatheter Once   aspirin EC  81 mg Oral Daily   atorvastatin  40 mg Oral Daily   Chlorhexidine Gluconate Cloth  6 each Topical Daily   heparin injection (subcutaneous)  5,000 Units Subcutaneous Q8H   hydrocortisone sod succinate (SOLU-CORTEF) inj  100 mg Intravenous Q8H   insulin aspart  0-9 Units Subcutaneous Q4H   mouth rinse  15 mL Mouth Rinse BID   mometasone-formoterol  2 puff Inhalation BID   pantoprazole (PROTONIX) IV  40 mg Intravenous Q12H

## 2021-04-06 NOTE — Procedures (Signed)
Central Venous Catheter Insertion Procedure Note  Vincent Chandler  [id number]  [birth year]  Date:04/06/21  Time:10:54 AM   Provider Performing:Polk Minor   Procedure: Insertion of Non-tunneled Central Venous Catheter(36556)with US guidance (83437)    Indication(s) Hemodialysis  Consent Risks of the procedure as well as the alternatives and risks of each were explained to the patient and/or caregiver.  Consent for the procedure was obtained and is signed in the bedside chart  Anesthesia Topical only with 1% lidocaine   Timeout Verified patient identification, verified procedure, site/side was marked, verified correct patient position, special equipment/implants available, medications/allergies/relevant history reviewed, required imaging and test results available.  Sterile Technique Maximal sterile technique including full sterile barrier drape, hand hygiene, sterile gown, sterile gloves, mask, hair covering, sterile ultrasound probe cover (if used).  Procedure Description Area of catheter insertion was cleaned with chlorhexidine and draped in sterile fashion.   With real-time ultrasound guidance a HD catheter was placed into the left internal jugular vein.  Nonpulsatile blood flow and easy flushing noted in all ports.  The catheter was sutured in place and sterile dressing applied.  Complications/Tolerance None; patient tolerated the procedure well. Chest X-ray is ordered to verify placement for internal jugular or subclavian cannulation.  Chest x-ray is not ordered for femoral cannulation.  EBL Minimal  Specimen(s) None

## 2021-04-06 NOTE — Progress Notes (Signed)
NAME:  Vincent Chandler, MRN:  [id number], DOB:  1960-10-26, LOS: 2 ADMISSION DATE:  04/12/2021, CONSULTATION DATE: 04/06/2021 REFERRING MD: EDP, CHIEF COMPLAINT: Syncope  Brief History   Vincent Chandler is a 60 year old male with past medical history significant for HFrEF, cardiomyopathy, AICD, atrial flutter on Eliquis, CAD, ILD on 3 L home O2 and OSA on CPAP, type 2 diabetes, GERD and hiatal hernia who presented to the ED after a syncopal episode while walking to the bathroom, sat down in his bed and then woke up on the floor.  He reports several weeks of lower extremity weakness and generally not feeling well with dizziness over the last several days along with dark stools.  He also reports nausea and constipation, denies any chest pain, palpitations, fevers, abdominal pain, diarrhea, new cough or URI symptoms.   On EMS arrival, patient was awake and alert though hypotensive.  He was given 500 cc IV fluid bolus.  In the ED, head and C-spine CTs were negative for acute findings, CXR without any infiltrate.  I-STAT was significant for potassium of 8.5, creatinine 5.7, hemoglobin 12.   He was given additional 500 cc LR and nephrology was consulted.  He has been urinating, reports decreased p.o. intake over several days.  He was given insulin, calcium, albuterol, and Lokelma and additional IV fluid ordered.  Lactic acid was 4.  Lab run BMP is pending.  PCCM consulted in the setting.  Past Medical History  HFrEF (EF of 25-30%) s/p AICD  CAD s/p CABG x4 Intermittent Afib  HLD OSA on CPAP ILD on chronic steroid  T2DM CKD Stage 3  Significant Hospital Events   10/03 - consulted for hyperkalemia and hypotension and admitted to [facility] 10/04 -advanced heart failure team consulted, central line placed left IJ and left more, right femoral A-line placed 10/04 - tried to initiate CRRT without success due to HD line problems.   Consults:  Advanced heart failure team Nephrology Palliative  care  Procedures:  10/04 -HD/CVC left IJ and left femoral, arterial line placed right femoral 10/05 - Rt HD cath removed and replacement a new HD cath.   Significant Diagnostic Tests:  Echocardiogram Result Date: 09/2019 1. Left ventricular ejection fraction, by estimation, is 20 to 25%. The left ventricle has severely decreased function. The left ventricle demonstrates global hypokinesis. The left ventricular internal cavity size was mildly dilated. There is mild left ventricular hypertrophy. Left ventricular diastolic parameters are consistent with Grade I diastolic dysfunction (impaired relaxation). 2. Right ventricular systolic function is moderately reduced. The right ventricular size is mildly enlarged. Tricuspid regurgitation signal is inadequate for assessing PA pressure. 3. The mitral valve is normal in structure. Trivial mitral valve regurgitation. No evidence of mitral stenosis. 4. The aortic valve is tricuspid. Aortic valve regurgitation is not visualized. No aortic stenosis is present. 5. The inferior vena cava is normal in size with <50% respiratory variability, suggesting right atrial pressure of 8 mmHg.  CT Chest High Resolution: Result Date: 08.12/2019 IMPRESSION: 1. Spectrum of findings compatible with basilar predominant fibrotic interstitial lung disease with probable early honeycombing. Findings have clearly progressed in the interval. Findings are consistent with UIP per consensus guidelines: Diagnosis of Idiopathic Pulmonary Fibrosis: An Official ATS/ERS/JRS/ALAT Clinical Practice Guideline. [place], Iss 5, [phone], Mar 03 2017. 2. Stable mild cardiomegaly. 3. Aberrant nonaneurysmal right subclavian artery. 4. Aortic Atherosclerosis (ICD10-I70.0).  DG Chest 2 View Result Date: 04/14/2021 FINDINGS: Multiple sternal wires and vascular clips  are seen. A single lead ventricular pacer is noted. A radiopaque stimulator and associated  stimulator wire are seen overlying the lateral aspect of the mid right lung. Mild atelectasis is seen within the bilateral lung bases. There is no evidence of a pleural effusion or pneumothorax. The heart size and mediastinal contours are within normal limits. The visualized skeletal structures are unremarkable.  IMPRESSION: 1. Evidence of prior median sternotomy/CABG. 2. Mild bibasilar atelectasis.    CT Head Wo Contrast Result Date: 04/17/2021 FINDINGS: Brain: There is mild cerebral atrophy with widening of the extra-axial spaces and ventricular dilatation. There are areas of decreased attenuation within the white matter tracts of the supratentorial brain, consistent with microvascular disease changes. Vascular: No hyperdense vessel or unexpected calcification. Skull: Normal. Negative for fracture or focal lesion. Sinuses/Orbits: There is marked severity right maxillary sinus, bilateral ethmoid sinus, sphenoid sinus and frontal sinus mucosal thickening. Postoperative changes are noted along the medial walls of the bilateral maxillary sinuses. Other: None.  IMPRESSION: 1. No acute intracranial abnormality. 2. Generalized cerebral atrophy and microvascular disease changes of the supratentorial brain. 3. Marked severity paranasal sinus disease.    CT Cervical Spine Wo Contrast Result Date: 04/26/2021 FINDINGS: Alignment: Normal. Skull base and vertebrae: No acute fracture. No primary bone lesion or focal pathologic process. Soft tissues and spinal canal: No prevertebral fluid or swelling. No visible canal hematoma. A metallic density stimulator wire is noted within the neck soft tissues on the right. Disc levels: Mild multilevel endplate sclerosis is seen throughout the cervical spine. There is mild to moderate severity narrowing of the anterior atlantoaxial articulation. Mild multilevel intervertebral disc space narrowing is seen. Moderate severity bilateral multilevel facet joint hypertrophy is noted. This  is most prominent at the level of C2-C3. Upper chest: Negative. Other: None.  IMPRESSION: 1. No acute osseous abnormality of the cervical spine. 2. Mild multilevel degenerative changes, as described above. E  DG CHEST PORT 1 VIEW  Result Date: 04/05/2021 FINDINGS: Interval placement of left IJ dialysis catheter to the proximal aspect of the innominate vein. No pneumothorax. Stable left subclavian AICD. Slight increase in patchy interstitial and airspace opacities involving bases more than apices. Heart size upper limits normal. CABG markers. No definite effusion. Implanted stimulator device projects over the right hemithorax, leads directed cephalad. Sternotomy wires.  IMPRESSION: 1. Left IJ catheter placement to the proximal innominate vein. No pneumothorax. 2. Slight worsening of bilateral infiltrates or edema.     ICD Interrogated:  Result Date: 04/05/2021 -Short run of NSVT in September -Intermittent atrial fibrillation  Echocardiogram: pending Micro Data:  10/03 - COVD19/Influ negative  10/03 - MRSA PCR - negative  10/03 - BCx NG<12hrs  Antimicrobials:  10/03 Ceftriaxone>  Interim History/Subjective:  Patient resting in bed.  He states that his shortness of breath is at baseline.  He denies any chest pain, nausea, palpitations.  He states that he did have some urine output overnight.  States that they were unable to start CRRT due to line troubles.  Objective:  Blood pressure 107/76, pulse (!) 112, temperature 98.4 F (36.9 C), temperature source Oral, resp. rate 13, height _0  (1.753 m), weight 75.2 kg, SpO2 97 %. CVP:  [0 mmHg-17 mmHg] 4 mmHg      Intake/Output Summary (Last 24 hours) at 04/06/2021 0610 Last data filed at 04/06/2021 0500 Gross per 24 hour  Intake 1094.52 ml  Output 450 ml  Net 644.52 ml   Filed Weights   04/22/2021 1916 04/05/21 0500 04/06/21 0315  Weight: 71.2 kg 75.1 kg 75.2 kg    Examination: General: Alert and oriented HENT: PERRLA and normal  extraocular eye motions, HD line in left IJ clean and dry Lungs: Clear to auscultation bilaterally Cardiovascular: Tachycardic, no murmurs rubs or gallops Abdomen: Nondistended nontender Extremities: Extremities feel warmer with improved distal pulses, but anasarca present Neuro: No focal neurologic deficits GU: No Foley catheter in place  Resolved Hospital Problem list     Assessment & Plan:   Acute on chronic HFrEF Cardiogenic shock  CAD s/p CABG x4 Patient continues to have hypotensive BP despite high doses of levo and milrinone.  He had a Co-ox at 98% and CVP at 4 likely not reliable and will repeat.  Patient's cardiogenic shock likely multifactorial but precipitated from his underlying progressive pulmonary disease.   -We will continue to monitor Co-ox and CVP -Start home statin and continue to hold BB/CBB -Continue Levophed and milrinone and titrate when possible -Keep MAPs > 65  -RRT per nephrology, will need to replace Rt HD cath. -Advanced heart failure team consulted and appreciate their assistance  Syncope: Patient presented with syncope concerning for cardiogenic syncope.  ICD was interrogated showing a short run of NSVT in September and therefore likely not arrhythmia induced syncope.  He does have history of DTs in the past and therefore we will need to continue to monitor this closely. - Keep K > 4 and Mg >2 - Echo pending  Intermittent Aflutter on Eliquis: Patient has a history of Afib on eliquis.  He is going in and out of atrial flutter on telemetry with PVCs.  ICD was interrogated showing short run of NSVT in September with intermittent atrial fibrillation.  Eliquis initially held due to concern for black stools.  Patient states that he took Pepto-Bismol and today's leading up to his hospitalization which is likely reason for his black stools.  He does have a recent 2 g drop in his hemoglobin with elevated RDW concerning for possible GI bleed.  Although, he does have  multiple cell lines that are decreased which could be secondary to critical illness and hypervolemia. - Continue to hold Eliquis - Hold BB/CCB   Cardiorenal Syndrome: Hx of Stage Stage 3:  Hyperkalemia: Patient was unable to start CRRT yesterday due to difficulties with the left central line.  He did however have 750 cc of urine output overnight.  He will likely still require CRRT, but will need another HD cath placed prior.  He remains hyperkalemic at 5.1 and will need lokelma today.  His kidney function remains stable with a creatinine of 5.29 and GFR fof 12 -Nephrology consulted and appreciate their assistance -Hold off on RRT until new HD cath placed - Will give a dose of lokelma for hyperkalemia -Avoid nephrotoxic medications when possible -Continue monitoring kidney function and urine output daily   IPF: Patient has a history of ILD/PF on chronic steroids thought to be secondary to IPF/amiodarone induced. - Will start stress dosed steroids    T2DM:  - Q4 hours CBG  - SSI  Goal CBG of 120-180  Best Practice:  Diet: Regular consistency (see orders) Pain/Anxiety/Delirium protocol (if indicated): N/A VAP protocol (if indicated): N/A DVT prophylaxis: SCD GI prophylaxis: N/A Glucose control: SSI Lines: Lines: Central line, Dialysis Catheter, and Arterial Line Foley: Foley:  Yes, and it is still needed Mobility: bed rest Code Status: full code Family Communication: pending Disposition: ICU  Labs   CBC: Recent Labs  Lab 04/10/2021 1918 04/19/2021 1929 04/05/21  0019 04/06/21 0130  WBC 6.5  --  6.4 5.4  NEUTROABS 4.9  --   --   --   HGB 11.5* 12.6* 11.1* 9.4*  HCT 36.6* 37.0* 35.4* 29.2*  MCV 128.0*  --  128.7* 121.7*  PLT PLATELET CLUMPS NOTED ON SMEAR, UNABLE TO ESTIMATE  --  99* 97*    Basic Metabolic Panel: Recent Labs  Lab 04/25/2021 2022 04/22/2021 2140 04/05/21 0019 04/05/21 1255 04/05/21 1800 04/06/21 0130 04/06/21 0500  NA 139   < > 135 130* 132* 130* 132*   K 3.6   < > 5.4* 5.1 5.4* 5.2* 5.1  CL 121*   < > 99 97* 97* 101 99  CO2 8*   < > 16* 17* 16* 16* 19*  GLUCOSE 76   < > 118* 209* 179* 226* 189*  BUN 68*   < > 106* 105* 103* 98* 98*  CREATININE 3.01*   < > 5.12* 5.30* 5.51* 5.25* 5.29*  CALCIUM 4.8*   < > 8.6* 8.1* 8.4* 7.4* 7.8*  MG 1.2*  --  2.6*  --   --  2.2  --   PHOS  --   --  7.8*  --  7.7*  --  7.5*   < > = values in this interval not displayed.   GFR: Estimated Creatinine Clearance: 15 mL/min (A) (by C-G formula based on SCr of 5.29 mg/dL (H)). Recent Labs  Lab 04/19/2021 1918 04/07/2021 1944 04/20/2021 2219 04/05/21 0019 04/05/21 0208 04/05/21 1550 04/06/21 0130  PROCALCITON  --   --   --  1.62  --   --  1.50  WBC 6.5  --   --  6.4  --   --  5.4  LATICACIDVEN  --  4.6* 3.7*  --  3.2* 1.5  --     Liver Function Tests: Recent Labs  Lab 04/05/2021 2022 04/05/21 0019 04/05/21 1800 04/06/21 0130 04/06/21 0500  AST 22  --   --  33  --   ALT 12  --   --  22  --   ALKPHOS 38  --   --  61  --   BILITOT 0.4  --   --  0.9  --   PROT 3.3*  --   --  5.4*  --   ALBUMIN 1.7* 3.0* 3.0* 2.7* 2.8*   No results for input(s): LIPASE, AMYLASE in the last 168 hours. No results for input(s): AMMONIA in the last 168 hours.  ABG    Component Value Date/Time   PHART 7.392 07/24/2018 0722   PCO2ART 34.3 07/24/2018 0722   PO2ART 94.7 07/24/2018 0722   HCO3 23.2 07/29/2020 1154   HCO3 22.5 07/29/2020 1154   TCO2 16 (L) 04/13/2021 1929   ACIDBASEDEF 3.0 (H) 07/29/2020 1154   ACIDBASEDEF 4.0 (H) 07/29/2020 1154   O2SAT 98.7 04/06/2021 0454     Coagulation Profile: Recent Labs  Lab 04/06/21 0130  INR 1.4*    Cardiac Enzymes: No results for input(s): CKTOTAL, CKMB, CKMBINDEX, TROPONINI in the last 168 hours.  HbA1C: Hgb A1c MFr Bld  Date/Time Value Ref Range Status  04/05/2021 12:19 AM 5.5 4.8 - 5.6 % Final    Comment:    (NOTE) Pre diabetes:          5.7%-6.4%  Diabetes:              >6.4%  Glycemic control for    <7.0% adults with diabetes   06/14/2020 02:02 PM 6.8 (  H) 4.6 - 6.5 % Final    Comment:    Glycemic Control Guidelines for People with Diabetes:Non Diabetic:  <6%Goal of Therapy: <7%Additional Action Suggested:  >8%     CBG: Recent Labs  Lab 04/05/21 0331 04/05/21 1555 04/05/21 1934 04/05/21 2341 04/06/21 0329  GLUCAP 105* 158* 166* 207* 187*    Review of Systems:   See above  Past Medical History  He,  has a past medical history of AICD (automatic cardioverter/defibrillator) present, Anginal pain ([place]), Anxiety, Asthma, Atrial fibrillation-postoperative (11/28/2012), Cardiomyopathy, CHF (congestive heart failure) ([place]), Chronic back pain, Coronary artery disease, Depression, Diabetes mellitus type II (dx'd in the 1990's), GERD (gastroesophageal reflux disease), H/O hiatal hernia, Hemorrhoids, History of hypogonadism, History of kidney stones, Hyperlipidemia, Hypotension, OSA on CPAP, Pneumonia, and Urinary incontinence.   Surgical History    Past Surgical History:  Procedure Laterality Date   A-FLUTTER ABLATION N/A 09/12/2017   Procedure: A-FLUTTER ABLATION;  Surgeon: Deboraha Sprang, MD;  Location: [place] CV LAB;  Service: Cardiovascular;  Laterality: N/A;   CARDIAC DEFIBRILLATOR PLACEMENT  01/15/2015   CARDIOVERSION N/A 08/10/2017   Procedure: CARDIOVERSION;  Surgeon: Larey Dresser, MD;  Location: [facility] ENDOSCOPY;  Service: Cardiovascular;  Laterality: N/A;   CORONARY ANGIOPLASTY WITH STENT PLACEMENT  ~ 2003   CORONARY ARTERY BYPASS GRAFT N/A 08/15/2012   Procedure: CORONARY ARTERY BYPASS GRAFTING (CABG);  Surgeon: Ivin Poot, MD;  Location: [place];  Service: Open Heart Surgery;  Laterality: N/A;  Coronary Artery Bypass Grafting Times Four Using Left Internal Mammary Artery and Right Saphenous Leg Vein Harvested Endoscopically   EP IMPLANTABLE DEVICE N/A 01/15/2015   Procedure: ICD Implant;  Surgeon: Deboraha Sprang, MD;  Location: [place] CV LAB;  Service:  Cardiovascular;  Laterality: N/A;   LEFT HEART CATHETERIZATION WITH CORONARY ANGIOGRAM N/A 08/08/2012   Procedure: LEFT HEART CATHETERIZATION WITH CORONARY ANGIOGRAM;  Surgeon: Peter M Martinique, MD;  Location: [facility] CATH LAB;  Service: Cardiovascular;  Laterality: N/A;   LEFT HEART CATHETERIZATION WITH CORONARY/GRAFT ANGIOGRAM N/A 07/21/2014   Procedure: LEFT HEART CATHETERIZATION WITH Beatrix Fetters;  Surgeon: Larey Dresser, MD;  Location: [facility] CATH LAB;  Service: Cardiovascular;  Laterality: N/A;   MULTIPLE EXTRACTIONS WITH ALVEOLOPLASTY N/A 10/04/2017   Procedure: Extraction of tooth #'s 5 and 14 with alveoloplasty and gross debridement of remaining teeth;  Surgeon: [name], DDS;  Location: [place];  Service: Oral Surgery;  Laterality: N/A;   PERCUTANEOUS CORONARY STENT INTERVENTION (PCI-S)  07/21/2014   Procedure: PERCUTANEOUS CORONARY STENT INTERVENTION (PCI-S);  Surgeon: Larey Dresser, MD;  Location[facility] CATH LAB;  Service: Cardiovascular;;   REFRACTIVE SURGERY Bilateral 1990's   RIGHT HEART CATH N/A 10/03/2017   Procedure: RIGHT HEART CATH;  Surgeon: Larey Dresser, MD;  Location: [place] CV LAB;  Service: Cardiovascular;  Laterality: N/A;   RIGHT HEART CATH N/A 12/11/2018   Procedure: RIGHT HEART CATH;  Surgeon: Larey Dresser, MD;  Location: [place] CV LAB;  Service: Cardiovascular;  Laterality: N/A;   RIGHT/LEFT HEART CATH AND CORONARY/GRAFT ANGIOGRAPHY N/A 07/29/2020   Procedure: RIGHT/LEFT HEART CATH AND CORONARY/GRAFT ANGIOGRAPHY;  Surgeon: Larey Dresser, MD;  Location: [place] CV LAB;  Service: Cardiovascular;  Laterality: N/A;   TEE WITHOUT CARDIOVERSION N/A 08/10/2017   Procedure: TRANSESOPHAGEAL ECHOCARDIOGRAM (TEE);  Surgeon: Larey Dresser, MD;  Location: [facility] ENDOSCOPY;  Service: Cardiovascular;  Laterality: N/A;   TONSILLECTOMY  ~ [place] Bilateral 11/28/2017   Procedure:  VIDEO BRONCHOSCOPY WITHOUT FLUORO;  Surgeon: Brand Males,  MD;  Location: [facility] ENDOSCOPY;  Service: Endoscopy;  Laterality: Bilateral;     Social History   reports that he has never smoked. His smokeless tobacco use includes chew. He reports current alcohol use of about 3.0 standard drinks per week. He reports that he does not use drugs.   Family History   His family history includes Diabetes in his father and mother; Hyperlipidemia in his father; Hypertension in his father and mother.   Allergies No Known Allergies   Home Medications  Prior to Admission medications   Medication Sig Start Date End Date Taking? Authorizing Provider  ACCU-CHEK FASTCLIX LANCETS MISC Use as directed once a day.  Dx code: E11.9 09/19/17  Yes Ann Held, DO  acetaminophen (TYLENOL) 500 MG tablet Take 1,000 mg by mouth every 6 (six) hours as needed for moderate pain or headache.   Yes [provider]  albuterol (ACCUNEB) 0.63 MG/3ML nebulizer solution Take 3 mLs (0.63 mg total) by nebulization every 6 (six) hours as needed for wheezing. 12/04/18  Yes Fenton Foy, NP  albuterol (VENTOLIN HFA) 108 (90 Base) MCG/ACT inhaler Inhale 2 puffs into the lungs every 6 (six) hours as needed for wheezing or shortness of breath.    Yes [provider]  allopurinol (ZYLOPRIM) 300 MG tablet Take 1 tablet (300 mg total) by mouth daily. 03/28/21  Yes Silverio Decamp, MD  apixaban (ELIQUIS) 5 MG TABS tablet TAKE 1 TABLET BY MOUTH 2 TIMES DAILY. Patient taking differently: Take 5 mg by mouth 2 (two) times daily. 08/03/20 08/03/21 Yes Larey Dresser, MD  atorvastatin (LIPITOR) 40 MG tablet Take 1 tablet (40 mg total) by mouth at bedtime. 02/22/21 02/22/22 Yes Milford, Maricela Bo, FNP  blood glucose meter kit and supplies Dispense based on patient and insurance preference. Use as directed once a day. Dx Code E11.9. 09/06/17  Yes [name], Alferd Apa, DO  Blood Glucose Monitoring Suppl (ACCU-CHEK GUIDE) w/Device KIT 1 each by Does not apply route daily. DX Code:  E11.9 09/19/17  Yes Lowne Lyndal Pulley R, DO  carvedilol (COREG) 3.125 MG tablet Take 1 tablet (3.125 mg total) by mouth 2 (two) times daily. 07/27/20  Yes Larey Dresser, MD  glucose blood (ACCU-CHEK GUIDE) test strip Use as directed once a day.  Dx code: E11.9 09/19/17  Yes Ann Held, DO  guaiFENesin (MUCINEX) 600 MG 12 hr tablet Take 1 tablet (600 mg total) by mouth 2 (two) times daily as needed for to loosen phlegm. 08/17/17  Yes Shirley Friar, PA-C  metFORMIN (GLUCOPHAGE) 500 MG tablet Take 1 tablet (500 mg total) by mouth 2 (two) times daily with a meal. 10/20/20  Yes Shamleffer, Melanie Crazier, MD  nitroGLYCERIN (NITROSTAT) 0.4 MG SL tablet Place 1 tablet (0.4 mg total) under the tongue every 5 (five) minutes as needed for chest pain. 07/21/20  Yes Larey Dresser, MD  OXYGEN Inhale 3 L into the lungs continuous.   Yes [provider]  Pirfenidone (ESBRIET) 267 MG TABS Take 1 tab three times daily for 7 days, then 2 tabs three times daily for 7 days, then 3 tabs three times daily thereafter. Patient taking differently: Take 3 tablets by mouth daily. 03/02/21  Yes Brand Males, MD  predniSONE (DELTASONE) 10 MG tablet TAKE 1 TABLET (10 MG TOTAL) BY MOUTH DAILY WITH BREAKFAST. 02/23/21  Yes Larey Dresser, MD  sacubitril-valsartan (ENTRESTO) 24-26 MG TAKE  1 TABLET BY MOUTH 2 (TWO) TIMES DAILY. 04/06/20 04/30/21 Yes Larey Dresser, MD  spironolactone (ALDACTONE) 25 MG tablet Take 0.5 tablets (12.5 mg total) by mouth daily. 02/22/21 05/23/21 Yes Milford, Maricela Bo, FNP  torsemide (DEMADEX) 20 MG tablet Take 40 mg by mouth daily. 03/16/21  Yes [provider]  fluticasone-salmeterol (ADVAIR HFA) 230-21 MCG/ACT inhaler Inhale 2 puffs into the lungs 2 (two) times daily. Patient not taking: Reported on 04/05/2021 11/26/20   Martyn Ehrich, NP  torsemide 40 MG TABS Take 40 mg by mouth daily. Patient not taking: Reported on 04/28/2021 03/16/21   Rafael Bihari, FNP     Critical care time: [place], D.O.  Internal Medicine Resident, PGY-3 [facility]  Pager: [phone] 6:10 AM, 04/06/2021

## 2021-04-06 NOTE — Progress Notes (Signed)
Responded to consult for assessment of [place] due to intermittent blood return. Per assessment line is positional. VAST recommendation is to not give TPA. Securechat to Dr. Royce Macadamia regarding this.

## 2021-04-06 NOTE — Progress Notes (Addendum)
Advanced Heart Failure Rounding Note  PCP-Cardiologist: None   Subjective:    On CRRT for about 10 min before began having issues with HD catheter. Clotting this am.   Made about 750 cc urine.  Co-ox 98.7 this am. Recheck pending. On 40 NE and 0.25 milrinone.   CVP 10.   Comfortable on 3L 02 [place].     Objective:   Weight Range: 75.2 kg Body mass index is 24.48 kg/m.   Vital Signs:   Temp:  [97.6 F (36.4 C)-98.4 F (36.9 C)] 97.6 F (36.4 C) (10/05 0807) Pulse Rate:  [104-126] 105 (10/05 0815) Resp:  [0-40] 0 (10/05 0815) BP: (88-125)/(55-76) 108/62 (10/05 0800) SpO2:  [88 %-100 %] 99 % (10/05 0815) Weight:  [75.2 kg] 75.2 kg (10/05 0315) Last BM Date: 04/17/2021  Weight change: Filed Weights   04/11/2021 1916 04/05/21 0500 04/06/21 0315  Weight: 71.2 kg 75.1 kg 75.2 kg    Intake/Output:   Intake/Output Summary (Last 24 hours) at 04/06/2021 0834 Last data filed at 04/06/2021 0800 Gross per 24 hour  Intake 1003.47 ml  Output 800 ml  Net 203.47 ml      Physical Exam  CVP 10 General:  Well appearing. Comfortable on 3L 02 [place] HEENT: normal Neck: supple. JVP 10-12. HD catheter left IJ. Carotids 2+ bilat; no bruits. No lymphadenopathy or thryomegaly appreciated. Cor: PMI nondisplaced. Irregular, tachycardic. No rubs, gallops or murmurs. Lungs: clear anteriorly Abdomen: soft, nontender, nondistended. No hepatosplenomegaly. No bruits or masses. Good bowel sounds. Extremities: no cyanosis, clubbing, rash, LE edema. CVC left femoral. LUE swelling and ecchymoses. Neuro: alert & orientedx3, cranial nerves grossly intact. moves all 4 extremities w/o difficulty. Affect pleasant    Telemetry   AF 110s, occasional PVCs, 6 beat run NSVT this am  EKG    No new this am for review  Labs    CBC Recent Labs    04/16/2021 1918 05/01/2021 1929 04/05/21 0019 04/06/21 0130  WBC 6.5  --  6.4 5.4  NEUTROABS 4.9  --   --   --   HGB 11.5*   < > 11.1* 9.4*  HCT 36.6*    < > 35.4* 29.2*  MCV 128.0*  --  128.7* 121.7*  PLT PLATELET CLUMPS NOTED ON SMEAR, UNABLE TO ESTIMATE  --  99* 97*   < > = values in this interval not displayed.   Basic Metabolic Panel Recent Labs    04/05/21 0019 04/05/21 1255 04/05/21 1800 04/06/21 0130 04/06/21 0500  NA 135   < > 132* 130* 132*  K 5.4*   < > 5.4* 5.2* 5.1  CL 99   < > 97* 101 99  CO2 16*   < > 16* 16* 19*  GLUCOSE 118*   < > 179* 226* 189*  BUN 106*   < > 103* 98* 98*  CREATININE 5.12*   < > 5.51* 5.25* 5.29*  CALCIUM 8.6*   < > 8.4* 7.4* 7.8*  MG 2.6*  --   --  2.2  --   PHOS 7.8*  --  7.7*  --  7.5*   < > = values in this interval not displayed.   Liver Function Tests Recent Labs    05/02/2021 2022 04/05/21 0019 04/06/21 0130 04/06/21 0500  AST 22  --  33  --   ALT 12  --  22  --   ALKPHOS 38  --  61  --   BILITOT 0.4  --  0.9  --   PROT 3.3*  --  5.4*  --   ALBUMIN 1.7*   < > 2.7* 2.8*   < > = values in this interval not displayed.   No results for input(s): LIPASE, AMYLASE in the last 72 hours. Cardiac Enzymes No results for input(s): CKTOTAL, CKMB, CKMBINDEX, TROPONINI in the last 72 hours.  BNP: BNP (last 3 results) Recent Labs    12/14/20 1255 02/15/21 1244 03/16/21 1422  BNP 768.9* 923.6* 1,237.2*    ProBNP (last 3 results) No results for input(s): PROBNP in the last 8760 hours.   D-Dimer No results for input(s): DDIMER in the last 72 hours. Hemoglobin A1C Recent Labs    04/05/21 0019  HGBA1C 5.5   Fasting Lipid Panel No results for input(s): CHOL, HDL, LDLCALC, TRIG, CHOLHDL, LDLDIRECT in the last 72 hours. Thyroid Function Tests No results for input(s): TSH, T4TOTAL, T3FREE, THYROIDAB in the last 72 hours.  Invalid input(s): FREET3  Other results:   Imaging    DG CHEST PORT 1 VIEW  Result Date: 04/05/2021 CLINICAL DATA:  Left central line placement EXAM: PORTABLE CHEST - 1 VIEW COMPARISON:  the previous day's study FINDINGS: Interval placement of left IJ  dialysis catheter to the proximal aspect of the innominate vein. No pneumothorax. Stable left subclavian AICD. Slight increase in patchy interstitial and airspace opacities involving bases more than apices. Heart size upper limits normal. CABG markers. No definite effusion. Implanted stimulator device projects over the right hemithorax, leads directed cephalad. Sternotomy wires. IMPRESSION: 1. Left IJ catheter placement to the proximal innominate vein. No pneumothorax. 2. Slight worsening of bilateral infiltrates or edema. Electronically Signed   By: Lucrezia Europe M.D.   On: 04/05/2021 12:30     Medications:     Scheduled Medications:  alteplase  2 mg Intracatheter Once   alteplase  2 mg Intracatheter Once   aspirin EC  81 mg Oral Daily   atorvastatin  40 mg Oral Daily   Chlorhexidine Gluconate Cloth  6 each Topical Daily   hydrocortisone sod succinate (SOLU-CORTEF) inj  100 mg Intravenous Q8H   insulin aspart  0-9 Units Subcutaneous Q4H   mouth rinse  15 mL Mouth Rinse BID   mometasone-formoterol  2 puff Inhalation BID   pantoprazole (PROTONIX) IV  40 mg Intravenous Q12H    Infusions:   prismasol BGK 4/2.5 Stopped (04/06/21 0130)    prismasol BGK 4/2.5 Stopped (04/05/21 2030)   sodium chloride     sodium chloride     cefTRIAXone (ROCEPHIN)  IV Stopped (04/05/21 1800)   milrinone 0.25 mcg/kg/min (04/06/21 0800)   norepinephrine (LEVOPHED) Adult infusion 42 mcg/min (04/06/21 0800)   prismasol BGK 4/2.5 Stopped (04/05/21 2030)    PRN Medications: albuterol, docusate sodium, fentaNYL (SUBLIMAZE) injection, heparin, lip balm, polyethylene glycol, sodium chloride    Patient Profile   Vincent Chandler is a 60 yo male with history of chronic systolic HF, CAD s/p CABG AFL s/p ablation and atrial fib, hx VT, ILD d/t amio toxicity and stage III CKD. Admitted yesterday with syncope and a/c biventricular failure/cardiogenic shock.  Assessment/Plan  1. Cardiogenic Shock/ Acute on Chronic  Biventricular Heart Failure:  - Most recent echo 3/22: LVEF 20-25%, RV moderately reduced  - Repeat echo  - ICD interrogated - no recurrent shocks for VT/VF, intermittent AF since 09/26.  - Currently on NE 40 and milrinone 0.25. MAPs on a-line 70s. Co-ox 98%, recheck pending - Avoiding DBA given concern for recent VT  -  CVP 6 - Initially anuric with Scr 5.3. HD cath clotting, PCCM replacing. Now making some urine. Nephrology following. ? Adding IV lasix. - Cannot r/o septic shock component. PCT elevated but in setting of ARF. Continue empiric abx per PCCM. BCx2 NGTD  - Keep MAP >65.  - May need mechanical support   2. Atrial fibrillation with RVR -Date of recurrence not known, back in Afib at least since 09/26. ? If contributing to shock. -Ventricular rates 110s -No amio d/t history of toxicity. Options are limited -Not currently anticoagulated. On Eliquis PTA. 3 g drop in Hgb. FOBT positive. Had been taking PeptoBismol PTA. Recheck CBC this am.  -Consider heparin pending results if okay with PCCM.  3. NSTEMI/known CAD: H/o CABG. Last intervention was PCI to LCx in 2016. Repeat cath (1/22) with occluded SVG-OM and SVG-RCA with patent LIMA-LAD and SVG-D, occluded proximal RCA, and newly occluded mid LCX. No targets for revascularization.  - Denies recent ischemic CP. Hs troponin up to 3,980. No LHC for now d/t AKI on CKD. - LFTs normal. Continue statin - Not currently anticoagulated. 3g drop in hgb over 24 hrs. Will recheck. Consider anticoagulation with heparin if remains stable. - no ? blocker w/ shock    4. Syncope: initially felt to be 2/2 volume depletion.  - No sustained VT on device check to explain syncope. Recurrent AF. - Keep >4.0. Keep Mg > 2.0  - repeat echo pending - continue NE for BP support   5. AKI on Stage IIIb CKD: SCr 5.70 on admit, baseline 1.5-2.0. Initially felt 2/2 volume depletion/hypotension.  Also w/ CGS - suspect ATN/ Cardiorenal from low output  - Scr  5.3. Appears volume up. PCCM replacing HD catheter. ? IV lasix now that making some urine.  - continue NE for BP support, keep MAP > 65   6. Hyperkalemia: K 5.4 in setting of ARF, down to 5.1 Received Lokelma - CVVHD as above   7. Hypomagnesemia: Mg 1.2 on admit, corrected w/ IV Mg.  - Improved, 2.2 today - monitor    8. H/o VT: see above. No longer on amio given h/o amio pulmonary toxicity  - has ICD. interrogate device today  - Keep K > 4.0, Mg > 2.0  - Monitor w/ dual inotropes    9. ILD: 2/2 amio toxicity, on chronic prednisone - followed by pulmonology, PCCM onboard   10. LUE swelling: - Will get [place] to r/o DVT  Length of Stay: 2  FINCH, Vincent N, PA-C  04/06/2021, 8:34 AM  Advanced Heart Failure Team Pager [phone] (M-F; 7a - 5p)  Please contact [facility] for night-coverage after hours (5p -7a ) and weekends on [email or web address]   Patient seen with PA, agree with the above note.   Currently, he is on CVVH pulling net negative 50 cc/hr UF.  He is on milrinone 0.25, NE 40, vasopressin 0.03.  No co-ox yet (arterial sample drawn).  CVP 13 on my measurement. UOP 800 cc yesterday.   Hgb lower today at 8.7, anticoagulation held for now.  FOBT+.  He remains in atrial fibrillation with rate 100s.   General: NAD Neck: JVP 12 cm, no thyromegaly or thyroid nodule.  Lungs: Clear to auscultation bilaterally with normal respiratory effort. CV: Nondisplaced PMI.  Heart regular S1/S2, no S3/S4, 1/6 SEM RUSB.  No peripheral edema.  Abdomen: Soft, nontender, no hepatosplenomegaly, no distention.  Skin: Intact without lesions or rashes.  Neurologic: Alert and oriented x 3.  Psych: Normal affect. Extremities:  No clubbing or cyanosis.  HEENT: Normal.   1. Acute on chronic systolic CHF: Ischemic cardiomyopathy. [employer] ICD single chamber. 2/19 admission for decompensated HF requiring milrinone. Echo 2/7/019 EF 15% Mildly dilated RV. CPX in 2/19 was suggestive of severe limitation due to  HF.   Has IVCD, not LBBB-like => saw Dr. Caryl Comes, will not upgrade ICD to CRT. Echo 3/21 with EF 20-25%.  Admitted with hypotension, AKI and concern for cardiogenic shock.  Today, he is on milrinone 0.25, NE 40, vasopressin 0.03. Co-ox not done yet (sent arterial sample), CVP 13.  He has been started on CVVH.  - Continue CVVH, aim for net negative UF 50 cc/hr.  - Continue milrinone 0.25, will send co-ox now => ok at 65%.  - Wean pressors as able.  - With significant ILD and now renal failure, not an LVAD or transplant candidate.   2. VT: 01/13/21. Asymptomatic. 1 ATP -> 1 shock that was successful. Device interrogated this admission, no VT.  3. Afib/ Atrial flutter: Admitted in 2/19 with severe HF decompensation in setting of atrial flutter with RVR.  He was cardioverted.  He then had atrial flutter ablation in 3/19. He is off amiodarone due to question of amiodarone lung toxicity. No palpitations.  He is back in atrial fibrillation with mild RVR this admission.  - Not candidate for amiodarone with concern for prior lung toxicity.    - Anticoagulation on hold for now with drop in hgb and +FOBT.  - Consider eventual DCCV but not yet.  4. CAD: S/p CABG.  Last intervention was PCI to LCx in 2016. Repeat cath (1/22) with occluded SVG-OM and SVG-RCA with patent LIMA-LAD and SVG-D, occluded proximal RCA, and newly occluded mid LCX. No targets for revascularization.  No chest pain but HS-TnI elevated to 3988 this admission.  I do not think this is true ACS, suspect demand ischemia with profound shock.  - Continue atorvastatin.  - ASA 81 for now.  5. Pulmonary fibrosis: PFTs in 2/19 were restrictive, and high resolution CT showed interstitial lung disease. Amiodarone was stopped due to concern for possible toxicity.  He has seen [name] => bronchoscopy suggestive of ILD related to amiodarone.  He has been on chronic prednisone.  High resolution CT chest repeated in 6/20, again showing ILD, described at UIP  pattern. He has started Ofev. I think that a significant amount of his symptomatology is due to pulmonary fibrosis. Baseline on home oxygen.  - Stress dose steroids (hydrocortisone).  6. OSA: Uses CPAP.   7. AKI on CKD stage 3: Possible ATN due to hypotension (sound like BP may have been low for a couple of weeks prior to admission based on symptoms. Still making some urine (800 cc yesterday).  - CVVH started, currently pulling 50 cc/hr net UF. 8. Pulmonary hypertension: Suspect group 3 PH due to lung disease (ILD). RHC (1/22) with moderate pulmonary arterial hypertension, PVR 4.2 WU. - He did not tolerate Tyvaso for PH-ILD. 9. DM2: He did not tolerate [place].  10. Thrombocytopenia: Suspect related to critical illness, 94 K today.  11. ID: Afebrile, he is on empiric ceftriaxone but no definite sign of infection.  12. GI bleeding: FOBT+ with hgb falling.  - Anticoagulation on hold for now.  - PPI.   CRITICAL CARE Performed by: Loralie Champagne  Total critical care time: 40 minutes  Critical care time was exclusive of separately billable procedures and treating other patients.  Critical care was necessary to treat or prevent  imminent or life-threatening deterioration.  Critical care was time spent personally by me on the following activities: development of treatment plan with patient and/or surrogate as well as nursing, discussions with consultants, evaluation of patient's response to treatment, examination of patient, obtaining history from patient or surrogate, ordering and performing treatments and interventions, ordering and review of laboratory studies, ordering and review of radiographic studies, pulse oximetry and re-evaluation of patient's condition.  Loralie Champagne 04/06/2021 1:21 PM

## 2021-04-06 NOTE — Progress Notes (Signed)
Left upper extremity venous duplex has been completed. Preliminary results can be found in CV Proc through chart review.   04/06/21 4:37 PM Vincent Chandler RVT

## 2021-04-06 NOTE — Progress Notes (Signed)
eLink Physician-Brief Progress Note Patient Name: SURYA SCHROETER DOB: 10/19/1960 MRN: [id number]   Date of Service  04/06/2021  HPI/Events of Note  Bedside RN having issues with CRRT line repeatedly clotting and other flow issues.  eICU Interventions  RN requested to check with attending nephrologist  on call.        Jak Haggar U Lior Hoen 04/06/2021, 1:07 AM

## 2021-04-06 NOTE — Progress Notes (Signed)
Pt placed on CPAP of 5 and resting comfortably. RT will cont to monitor.

## 2021-04-06 NOTE — Progress Notes (Addendum)
eLink Physician-Brief Progress Note Patient Name: Vincent Chandler DOB: 1961-02-09 MRN: [id number]   Date of Service  04/06/2021  HPI/Events of Note  Patient on 40 mcg of Norepinephrine gtt with MAP 60-63 mmHg.  eICU Interventions  Norepinephrine ceiling increased to 50 mcg, and MAP target lowered to > 60 mmHg.        Kerry Kass Vincent Chandler 04/06/2021, 2:24 AM

## 2021-04-06 NOTE — Progress Notes (Signed)
Pt has refused CPAP for the night. RN aware. RT will cont to monitor.

## 2021-04-07 DIAGNOSIS — R570 Cardiogenic shock: Secondary | ICD-10-CM | POA: Diagnosis not present

## 2021-04-07 DIAGNOSIS — I5023 Acute on chronic systolic (congestive) heart failure: Secondary | ICD-10-CM | POA: Diagnosis not present

## 2021-04-07 DIAGNOSIS — N179 Acute kidney failure, unspecified: Secondary | ICD-10-CM | POA: Diagnosis not present

## 2021-04-07 LAB — CBC
HCT: 21.7 % — ABNORMAL LOW (ref 39.0–52.0)
HCT: 25.2 % — ABNORMAL LOW (ref 39.0–52.0)
Hemoglobin: 7.2 g/dL — ABNORMAL LOW (ref 13.0–17.0)
Hemoglobin: 8 g/dL — ABNORMAL LOW (ref 13.0–17.0)
MCH: 40 pg — ABNORMAL HIGH (ref 26.0–34.0)
MCH: 36 pg — ABNORMAL HIGH (ref 26.0–34.0)
MCHC: 33.2 g/dL (ref 30.0–36.0)
MCHC: 31.7 g/dL (ref 30.0–36.0)
MCV: 120.6 fL — ABNORMAL HIGH (ref 80.0–100.0)
MCV: 113.5 fL — ABNORMAL HIGH (ref 80.0–100.0)
Platelets: 70 10*3/uL — ABNORMAL LOW (ref 150–400)
Platelets: 70 10*3/uL — ABNORMAL LOW (ref 150–400)
RBC: 1.8 MIL/uL — ABNORMAL LOW (ref 4.22–5.81)
RBC: 2.22 MIL/uL — ABNORMAL LOW (ref 4.22–5.81)
RDW: 15.9 % — ABNORMAL HIGH (ref 11.5–15.5)
WBC: 3.7 10*3/uL — ABNORMAL LOW (ref 4.0–10.5)
WBC: 4.4 10*3/uL (ref 4.0–10.5)
nRBC: 0 % (ref 0.0–0.2)
nRBC: 0.9 % — ABNORMAL HIGH (ref 0.0–0.2)

## 2021-04-07 LAB — COOXEMETRY PANEL
Carboxyhemoglobin: 1.2 % (ref 0.5–1.5)
Carboxyhemoglobin: 1.9 % — ABNORMAL HIGH (ref 0.5–1.5)
Methemoglobin: 0.9 % (ref 0.0–1.5)
Methemoglobin: 1.3 % (ref 0.0–1.5)
O2 Saturation: 54.1 %
O2 Saturation: 70.9 %
Total hemoglobin: 7.6 g/dL — ABNORMAL LOW (ref 12.0–16.0)
Total hemoglobin: 8.3 g/dL — ABNORMAL LOW (ref 12.0–16.0)

## 2021-04-07 LAB — RENAL FUNCTION PANEL
Albumin: 2.7 g/dL — ABNORMAL LOW (ref 3.5–5.0)
Albumin: 2.9 g/dL — ABNORMAL LOW (ref 3.5–5.0)
Anion gap: 12 (ref 5–15)
Anion gap: 9 (ref 5–15)
BUN: 45 mg/dL — ABNORMAL HIGH (ref 6–20)
BUN: 27 mg/dL — ABNORMAL HIGH (ref 6–20)
CO2: 22 mmol/L (ref 22–32)
CO2: 23 mmol/L (ref 22–32)
Calcium: 7.7 mg/dL — ABNORMAL LOW (ref 8.9–10.3)
Calcium: 7.9 mg/dL — ABNORMAL LOW (ref 8.9–10.3)
Chloride: 101 mmol/L (ref 98–111)
Chloride: 104 mmol/L (ref 98–111)
Creatinine, Ser: 2.59 mg/dL — ABNORMAL HIGH (ref 0.61–1.24)
Creatinine, Ser: 1.7 mg/dL — ABNORMAL HIGH (ref 0.61–1.24)
GFR, Estimated: 28 mL/min — ABNORMAL LOW (ref >60)
GFR, Estimated: 46 mL/min — ABNORMAL LOW (ref >60)
Glucose, Bld: 167 mg/dL — ABNORMAL HIGH (ref 70–99)
Glucose, Bld: 234 mg/dL — ABNORMAL HIGH (ref 70–99)
Phosphorus: 3.2 mg/dL (ref 2.5–4.6)
Phosphorus: 2.4 mg/dL — ABNORMAL LOW (ref 2.5–4.6)
Potassium: 4.6 mmol/L (ref 3.5–5.1)
Potassium: 4.6 mmol/L (ref 3.5–5.1)
Sodium: 135 mmol/L (ref 135–145)
Sodium: 136 mmol/L (ref 135–145)

## 2021-04-07 LAB — GLUCOSE, CAPILLARY
Glucose-Capillary: 178 mg/dL — ABNORMAL HIGH (ref 70–99)
Glucose-Capillary: 158 mg/dL — ABNORMAL HIGH (ref 70–99)
Glucose-Capillary: 145 mg/dL — ABNORMAL HIGH (ref 70–99)
Glucose-Capillary: 216 mg/dL — ABNORMAL HIGH (ref 70–99)
Glucose-Capillary: 229 mg/dL — ABNORMAL HIGH (ref 70–99)
Glucose-Capillary: 279 mg/dL — ABNORMAL HIGH (ref 70–99)
Glucose-Capillary: 225 mg/dL — ABNORMAL HIGH (ref 70–99)

## 2021-04-07 LAB — PROCALCITONIN: Procalcitonin: 0.26 ng/mL

## 2021-04-07 LAB — MAGNESIUM: Magnesium: 2.2 mg/dL (ref 1.7–2.4)

## 2021-04-07 LAB — PREPARE RBC (CROSSMATCH)

## 2021-04-07 MED ORDER — SODIUM CHLORIDE 0.9% FLUSH: 10.0000 mL | INTRAVENOUS | Status: DC | PRN

## 2021-04-07 MED ORDER — SODIUM CHLORIDE 0.9% FLUSH
10.0000 mL | Freq: Two times a day (BID) | INTRAVENOUS | Status: DC
  Administered 2021-04-07 – 2021-04-11 (×7): 10 mL
  Administered 2021-04-11: 20 mL
  Administered 2021-04-12 – 2021-04-13 (×4): 10 mL
  Administered 2021-04-14: 20 mL
  Administered 2021-04-14: 10 mL

## 2021-04-07 MED ORDER — B COMPLEX-C PO TABS
1.0000 | ORAL_TABLET | Freq: Every day | ORAL | Status: DC
  Administered 2021-04-07 – 2021-04-10 (×4): 1 via ORAL
  Filled 2021-04-07 (×4): qty 1

## 2021-04-07 MED ORDER — MIDODRINE HCL 5 MG PO TABS
5.0000 mg | ORAL_TABLET | Freq: Three times a day (TID) | ORAL | Status: DC
  Administered 2021-04-07 (×2): 5 mg via ORAL
  Filled 2021-04-07 (×2): qty 1

## 2021-04-07 MED ORDER — SODIUM CHLORIDE 0.9% IV SOLUTION: Freq: Once | INTRAVENOUS | Status: DC

## 2021-04-07 MED ORDER — ENSURE ENLIVE PO LIQD
237.0000 mL | Freq: Three times a day (TID) | ORAL | Status: DC
  Administered 2021-04-07 – 2021-04-10 (×9): 237 mL via ORAL

## 2021-04-07 MED ORDER — INFLUENZA VAC SPLIT QUAD 0.5 ML IM SUSY
0.5000 mL | PREFILLED_SYRINGE | INTRAMUSCULAR | Status: DC
  Filled 2021-04-07: qty 0.5

## 2021-04-07 NOTE — Progress Notes (Addendum)
Advanced Heart Failure Rounding Note  PCP-Cardiologist: None   Subjective:    Co-ox 54%. NE weaned from 51 to 30mg/min. Also on 0.25 milrinone + 0.03 Vasopressin.  Tolerating CVVH. Scr trended down to 2.59. Made 550 cc urine yesterday.  CVP 13-14  Comfortable on 3L [place]. No complaints other than feels cold. Requesting more blankets.  Hgb down to 7.2.     Objective:   Weight Range: 79.9 kg Body mass index is 26.01 kg/m.   Vital Signs:   Temp:  [97.6 F (36.4 C)-98 F (36.7 C)] 97.7 F (36.5 C) (10/06 0335) Pulse Rate:  [85-137] 92 (10/06 0645) Resp:  [0-27] 15 (10/06 0645) BP: (83-134)/(50-88) 101/72 (10/06 0600) SpO2:  [84 %-100 %] 100 % (10/06 0645) Arterial Line BP: (90-145)/(40-73) 96/48 (10/06 0645) FiO2 (%):  [32 %] 32 % (10/05 0837) Weight:  [79.9 kg] 79.9 kg (10/06 0500) Last BM Date: 04/23/2021  Weight change: Filed Weights   04/05/21 0500 04/06/21 0315 04/07/21 0500  Weight: 75.1 kg 75.2 kg 79.9 kg    Intake/Output:   Intake/Output Summary (Last 24 hours) at 04/07/2021 0706 Last data filed at 04/07/2021 0600 Gross per 24 hour  Intake 839.63 ml  Output 2399 ml  Net -1559.37 ml      Physical Exam  CVP 13-14 General:  Well appearing. Comfortable on 3L 02 [place] HEENT: normal Neck: supple. JVP to jaw. HD catheter left IJ. Carotids 2+ bilat; no bruits. No lymphadenopathy or thryomegaly appreciated. Cor: PMI nondisplaced. Irregular, tachycardic. No rubs, gallops or murmurs. Lungs: clear anteriorly Abdomen: soft, nontender, nondistended. No hepatosplenomegaly. No bruits or masses. Good bowel sounds. Extremities: no cyanosis, clubbing, rash, LE edema. CVC left femoral. LUE swelling better Neuro: alert & orientedx3, cranial nerves grossly intact. moves all 4 extremities w/o difficulty. Affect pleasant    Telemetry   AF 90s-110s, up to 10 PVCs/min  EKG    No new this am for review  Labs    CBC Recent Labs    04/21/2021 1918 04/07/2021 1929  04/06/21 2009 04/07/21 0313  WBC 6.5   < > 4.4 3.7*  NEUTROABS 4.9  --   --   --   HGB 11.5*   < > 7.5* 7.2*  HCT 36.6*   < > 23.0* 21.7*  MCV 128.0*   < > 118.6* 120.6*  PLT PLATELET CLUMPS NOTED ON SMEAR, UNABLE TO ESTIMATE   < > 81* 70*   < > = values in this interval not displayed.   Basic Metabolic Panel Recent Labs    04/06/21 0130 04/06/21 0500 04/06/21 1631 04/07/21 0313  NA 130*   < > 134* 135  K 5.2*   < > 4.9 4.6  CL 101   < > 102 101  CO2 16*   < > 20* 22  GLUCOSE 226*   < > 181* 167*  BUN 98*   < > 73* 45*  CREATININE 5.25*   < > 4.03* 2.59*  CALCIUM 7.4*   < > 7.6* 7.7*  MG 2.2  --   --  2.2  PHOS  --    < > 5.5* 3.2   < > = values in this interval not displayed.   Liver Function Tests Recent Labs    04/20/2021 2022 04/05/21 0019 04/06/21 0130 04/06/21 0500 04/06/21 1631 04/07/21 0313  AST 22  --  33  --   --   --   ALT 12  --  22  --   --   --  ALKPHOS 38  --  61  --   --   --   BILITOT 0.4  --  0.9  --   --   --   PROT 3.3*  --  5.4*  --   --   --   ALBUMIN 1.7*   < > 2.7*   < > 2.7* 2.7*   < > = values in this interval not displayed.   No results for input(s): LIPASE, AMYLASE in the last 72 hours. Cardiac Enzymes No results for input(s): CKTOTAL, CKMB, CKMBINDEX, TROPONINI in the last 72 hours.  BNP: BNP (last 3 results) Recent Labs    12/14/20 1255 02/15/21 1244 03/16/21 1422  BNP 768.9* 923.6* 1,237.2*    ProBNP (last 3 results) No results for input(s): PROBNP in the last 8760 hours.   D-Dimer No results for input(s): DDIMER in the last 72 hours. Hemoglobin A1C Recent Labs    04/05/21 0019  HGBA1C 5.5   Fasting Lipid Panel No results for input(s): CHOL, HDL, LDLCALC, TRIG, CHOLHDL, LDLDIRECT in the last 72 hours. Thyroid Function Tests No results for input(s): TSH, T4TOTAL, T3FREE, THYROIDAB in the last 72 hours.  Invalid input(s): FREET3  Other results:   Imaging    DG CHEST PORT 1 VIEW  Result Date:  04/06/2021 CLINICAL DATA:  Hemodialysis catheter replacement EXAM: PORTABLE CHEST 1 VIEW COMPARISON:  Portable exam 1100 hours compared to 04/05/2021 FINDINGS: New LEFT subclavian dual lumen central venous catheter with tip projecting over SVC near cavoatrial junction. LEFT subclavian ICD with lead projecting over RIGHT ventricle. Stimulator via RIGHT chest with lead in the RIGHT cervical region. Enlargement of cardiac silhouette post CABG. Persistent patchy infiltrates bilaterally, question edema versus infection. No pleural effusion or pneumothorax. IMPRESSION: No pneumothorax following LEFT jugular line placement. Enlargement of cardiac silhouette post CABG and ICD with persistent BILATERAL pulmonary infiltrates which could represent pulmonary edema or infection, little changed. Electronically Signed   By: Lavonia Dana M.D.   On: 04/06/2021 11:53   ECHOCARDIOGRAM COMPLETE  Result Date: 04/06/2021    ECHOCARDIOGRAM REPORT   Patient Name:   [facility] Date of Exam: 04/06/2021 Medical Rec #:  [id number]      Height:       69.0 in Accession #:    [id number]     Weight:       165.8 lb Date of Birth:  1961/03/12      BSA:          1.908 m Patient Age:    60 years       BP:           108/62 mmHg Patient Gender: M              HR:           118 bpm. Exam Location:  Inpatient Procedure: 2D Echo, Cardiac Doppler, Color Doppler and Intracardiac            Opacification Agent Indications:    CHF  History:        Patient has prior history of Echocardiogram examinations, most                 recent 09/19/2019. Cardiomyopathy and CHF, CAD, Arrythmias:Atrial                 Fibrillation; Risk Factors:Diabetes and Dyslipidemia.  Sonographer:    Maudry Mayhew MHA, RDMS, RVT, RDCS Referring Phys: [place] Comments: Technically difficult study due  to poor echo windows and no subcostal window. Patient on CRRT during echocardiogram. IMPRESSIONS  1. No apical thrombus with Definity contrast. Left  ventricular ejection fraction, by estimation, is <20%. The left ventricle has severely decreased function. The left ventricle demonstrates global hypokinesis. Left ventricular diastolic function could not be evaluated.  2. Right ventricular systolic function is moderately reduced. The right ventricular size is mildly enlarged.  3. The mitral valve is abnormal. Trivial mitral valve regurgitation.  4. The aortic valve was not well visualized. Aortic valve regurgitation is not visualized. Mild aortic valve sclerosis is present, with no evidence of aortic valve stenosis. Comparison(s): Changes from prior study are noted. 09/19/2019: LVEF 20-25%. FINDINGS  Left Ventricle: No apical thrombus with Definity contrast. Left ventricular ejection fraction, by estimation, is <20%. The left ventricle has severely decreased function. The left ventricle demonstrates global hypokinesis. Definity contrast agent was given IV to delineate the left ventricular endocardial borders. The left ventricular internal cavity size was normal in size. There is no left ventricular hypertrophy. Left ventricular diastolic function could not be evaluated due to atrial fibrillation.  Left ventricular diastolic function could not be evaluated. Right Ventricle: The right ventricular size is mildly enlarged. No increase in right ventricular wall thickness. Right ventricular systolic function is moderately reduced. Left Atrium: Left atrial size was normal in size. Right Atrium: Right atrial size was normal in size. Pericardium: There is no evidence of pericardial effusion. Mitral Valve: The mitral valve is abnormal. There is mild thickening of the mitral valve leaflet(s). Trivial mitral valve regurgitation. Tricuspid Valve: The tricuspid valve is grossly normal. Tricuspid valve regurgitation is mild. Aortic Valve: The aortic valve was not well visualized. Aortic valve regurgitation is not visualized. Mild aortic valve sclerosis is present, with no evidence  of aortic valve stenosis. Aortic valve mean gradient measures 2.0 mmHg. Aortic valve peak gradient measures 4.2 mmHg. Aortic valve area, by VTI measures 2.78 cm. Pulmonic Valve: The pulmonic valve was not well visualized. Pulmonic valve regurgitation is not visualized. Aorta: The aortic root and ascending aorta are structurally normal, with no evidence of dilitation. IAS/Shunts: No atrial level shunt detected by color flow Doppler. Additional Comments: A device lead is visualized.  LEFT VENTRICLE PLAX 2D LVIDd:         5.30 cm     Diastology LVIDs:         5.00 cm     LV e' medial:    5.78 cm/s LV PW:         0.60 cm     LV E/e' medial:  21.5 LV IVS:        0.60 cm     LV e' lateral:   20.00 cm/s LVOT diam:     2.10 cm     LV E/e' lateral: 6.2 LV SV:         38 LV SV Index:   20 LVOT Area:     3.46 cm  LV Volumes (MOD) LV vol d, MOD A4C: 80.1 ml LV vol s, MOD A4C: 68.3 ml LV SV MOD A4C:     80.1 ml RIGHT VENTRICLE RV S prime:     6.08 cm/s TAPSE (M-mode): 0.7 cm LEFT ATRIUM             Index LA diam:        3.00 cm 1.57 cm/m LA Vol (A2C):   58.2 ml 30.51 ml/m LA Vol (A4C):   40.1 ml 21.02 ml/m LA Biplane Vol: 52.1 ml  27.31 ml/m  AORTIC VALVE AV Area (Vmax):    2.91 cm AV Area (Vmean):   2.97 cm AV Area (VTI):     2.78 cm AV Vmax:           102.00 cm/s AV Vmean:          63.500 cm/s AV VTI:            0.136 m AV Peak Grad:      4.2 mmHg AV Mean Grad:      2.0 mmHg LVOT Vmax:         85.80 cm/s LVOT Vmean:        54.400 cm/s LVOT VTI:          0.109 m LVOT/AV VTI ratio: 0.80  AORTA Ao Root diam: 2.90 cm MITRAL VALVE                TRICUSPID VALVE MV Area (PHT): 5.20 cm     TR Peak grad:   22.3 mmHg MV Decel Time: 146 msec     TR Vmax:        236.00 cm/s MV E velocity: 124.00 cm/s                             SHUNTS                             Systemic VTI:  0.11 m                             Systemic Diam: 2.10 cm Lyman Bishop MD Electronically signed by Lyman Bishop MD Signature Date/Time:  04/06/2021/5:02:29 PM    Final    VAS [place] UPPER EXTREMITY VENOUS DUPLEX  Result Date: 04/06/2021 UPPER VENOUS STUDY  Patient Name:  XUE LOW [facility]  Date of Exam:   04/06/2021 Medical Rec #: [id number]       Accession #:    [id number] Date of Birth: 08/21/1960       Patient Gender: M Patient Age:   89 years Exam Location:  [facility] Procedure:      VAS [place] UPPER EXTREMITY VENOUS DUPLEX Referring Phys: Ria Comment [facility] [place] --------------------------------------------------------------------------------  Indications: Swelling Risk Factors: None identified. Limitations: Poor ultrasound/tissue interface, bandages, line, open wound and Dialysis. Comparison Study: No prior studies. Performing Technologist: Oliver Hum RVT  Examination Guidelines: A complete evaluation includes B-mode imaging, spectral Doppler, color Doppler, and power Doppler as needed of all accessible portions of each vessel. Bilateral testing is considered an integral part of a complete examination. Limited examinations for reoccurring indications may be performed as noted.  Right Findings: +----------+------------+---------+-----------+----------+-------+ RIGHT     CompressiblePhasicitySpontaneousPropertiesSummary +----------+------------+---------+-----------+----------+-------+ Subclavian    Full       Yes       Yes                      +----------+------------+---------+-----------+----------+-------+  Left Findings: +----------+------------+---------+-----------+----------+-------+ LEFT      CompressiblePhasicitySpontaneousPropertiesSummary +----------+------------+---------+-----------+----------+-------+ Subclavian    Full       Yes       Yes                      +----------+------------+---------+-----------+----------+-------+ Axillary      Full       Yes       Yes                      +----------+------------+---------+-----------+----------+-------+  Brachial      Full       Yes       Yes                       +----------+------------+---------+-----------+----------+-------+ Radial        Full                                          +----------+------------+---------+-----------+----------+-------+ Ulnar         Full                                          +----------+------------+---------+-----------+----------+-------+ Cephalic      Full                                          +----------+------------+---------+-----------+----------+-------+ Basilic       Full                                          +----------+------------+---------+-----------+----------+-------+  Summary:  Right: No evidence of thrombosis in the subclavian.  Left: No evidence of deep vein thrombosis in the upper extremity. No evidence of superficial vein thrombosis in the upper extremity.  *See table(s) above for measurements and observations.  Diagnosing physician: Harold Barban MD Electronically signed by Harold Barban MD on 04/06/2021 at 4:52:35 PM.    Final      Medications:     Scheduled Medications:  alteplase  2 mg Intracatheter Once   alteplase  2 mg Intracatheter Once   aspirin EC  81 mg Oral Daily   atorvastatin  40 mg Oral Daily   Chlorhexidine Gluconate Cloth  6 each Topical Daily   heparin injection (subcutaneous)  5,000 Units Subcutaneous Q8H   hydrocortisone sod succinate (SOLU-CORTEF) inj  100 mg Intravenous Q8H   insulin aspart  0-9 Units Subcutaneous Q4H   mouth rinse  15 mL Mouth Rinse BID   mometasone-formoterol  2 puff Inhalation BID   pantoprazole (PROTONIX) IV  40 mg Intravenous Q12H   sodium chloride flush  10-40 mL Intracatheter Q12H    Infusions:   prismasol BGK 4/2.5 400 mL/hr at 04/07/21 0003    prismasol BGK 4/2.5 300 mL/hr at 04/07/21 0324   sodium chloride     sodium chloride     cefTRIAXone (ROCEPHIN)  IV Stopped (04/06/21 2030)   milrinone 0.25 mcg/kg/min (04/07/21 0600)   norepinephrine (LEVOPHED) Adult infusion 4 mcg/min (04/07/21 0600)    prismasol BGK 4/2.5 1,500 mL/hr at 04/07/21 0410   vasopressin 0.03 Units/min (04/07/21 0636)    PRN Medications: albuterol, docusate sodium, fentaNYL (SUBLIMAZE) injection, heparin, lip balm, polyethylene glycol, sodium chloride, sodium chloride flush    Patient Profile   Mr. Carreira is a 60 yo male with history of chronic systolic HF, CAD s/p CABG AFL s/p ablation and atrial fib, hx VT, ILD d/t amio toxicity and stage III CKD. Admitted yesterday with syncope and a/c biventricular failure/cardiogenic shock.  Assessment/Plan   1. Acute on chronic systolic CHF: -Ischemic cardiomyopathy. Medtronic ICD single chamber. 2/19 admission for  decompensated HF requiring milrinone. Echo 2/7/019 EF 15% Mildly dilated RV. CPX in 2/19 was suggestive of severe limitation due to HF.   Has IVCD, not LBBB-like => saw Dr. Caryl Comes, will not upgrade ICD to CRT. Echo 3/21 with EF 20-25%. -Admitted with hypotension, AKI and concern for cardiogenic shock.   -Co-ox 54% on milrinone 0.25, vasopressin 0.03, NE down to 2.  -CVP 13-14.  Volume up. He has been started on CVVH, tolerating well. Pulling for net negative UF 50 cc/hr.  -continue to wean pressors as able. Keep MAPs > 65 - No GDMT for now with shock -With significant ILD and now renal failure, not an LVAD or transplant candidate.    2. VT:  -01/13/21. Asymptomatic. 1 ATP -> 1 shock that was successful.  -Device interrogated this admission, no VT.   3. Afib/ Atrial flutter:  -Admitted in 2/19 with severe HF decompensation in setting of atrial flutter with RVR.  He was cardioverted.  He then had atrial flutter ablation in 3/19. He is off amiodarone due to question of amiodarone lung toxicity. No palpitations.  He is back in atrial fibrillation with mild RVR this admission.  -Not candidate for amiodarone with concern for prior lung toxicity.    -Anticoagulation on hold for now with drop in hgb and +FOBT. -Hgb down to 7.2 this am.  -Consider eventual DCCV but  not yet.   4. CAD:  -S/p CABG.  Last intervention was PCI to LCx in 2016. Repeat cath (1/22) with occluded SVG-OM and SVG-RCA with patent LIMA-LAD and SVG-D, occluded proximal RCA, and newly occluded mid LCX. No targets for revascularization.   -No chest pain but HS-TnI elevated to 3988 this admission.  Do not think this is true ACS, suspect demand ischemia with profound shock.  -Continue atorvastatin.  -ASA 81 for now.   5. Pulmonary fibrosis:  -PFTs in 2/19 were restrictive, and high resolution CT showed interstitial lung disease. Amiodarone was stopped due to concern for possible toxicity.  He has seen [name] => bronchoscopy suggestive of ILD related to amiodarone.  He has been on chronic prednisone.  High resolution CT chest repeated in 6/20, again showing ILD, described at UIP pattern. He has started Ofev.  -Baseline on home oxygen.  -Stress dose steroids (hydrocortisone).   6. OSA:  -Uses CPAP.    7. AKI on CKD stage 3:  -Possible ATN due to hypotension (sound like BP may have been low for a couple of weeks prior to admission based on symptoms.  -Scr > 5 initially, down to 2.59 this am -Still making some urine   -CVVH started, pulling 50 cc/hr net UF.  8. Pulmonary hypertension:  -Suspect group 3 PH due to lung disease (ILD). RHC (1/22) with moderate pulmonary arterial hypertension, PVR 4.2 WU. - He did not tolerate Tyvaso for PH-ILD.  9. DM2:  -He did not tolerate [place].   10. Thrombocytopenia:  -Suspect related to critical illness, 94 > 70K  11. ID:  -Afebrile, he is on empiric ceftriaxone but no definite sign of infection. BC with NGTD X 2. Procalcitonin trending down.  12. GI bleeding:  -FOBT+ with hgb falling.  -Anticoagulation on hold for now.  -PPI.  -Hgb 12.6 day of admit > trending down to 7.2. Will give 1 unit pRBCs. Type/screen completed. Recheck CBC this afternoon -On questioning, reports dark stools prior to admit even before taking  PeptoBismol    Length of Stay: [place], LINDSAY N, PA-C  04/07/2021, 7:06 AM  Advanced Heart Failure Team Pager [phone] (M-F; 7a - 5p)  Please contact [facility] for night-coverage after hours (5p -7a ) and weekends on [email or web address]   Patient seen with PA, agree with the above note.   CVP 14, co-ox 54%.  He is on milrinone 0.25, NE 8, vasopressin 0.03.  Pulling CVVH UF 50-100 cc/hr.  UOP 550 cc.   Hgb down to 7.2 with plts 70.   General: NAD Neck: JVP 12 cm, no thyromegaly or thyroid nodule.  Lungs: Clear to auscultation bilaterally with normal respiratory effort. CV: Nondisplaced PMI.  Heart regular S1/S2, no S3/S4, no murmur.  No peripheral edema.   Abdomen: Soft, nontender, no hepatosplenomegaly, no distention.  Skin: Intact without lesions or rashes.  Neurologic: Alert and oriented x 3.  Psych: Normal affect. Extremities: No clubbing or cyanosis.  HEENT: Normal.   1. Acute on chronic systolic CHF: Ischemic cardiomyopathy. Medtronic ICD single chamber. 2/19 admission for decompensated HF requiring milrinone. Echo 2/7/019 EF 15% Mildly dilated RV. CPX in 2/19 was suggestive of severe limitation due to HF.   Has IVCD, not LBBB-like => saw Dr. Caryl Comes, will not upgrade ICD to CRT. Echo 3/21 with EF 20-25%.  Echo this admission with EF < 20%, moderate RV dysfunction.  Admitted with hypotension, AKI and concern for cardiogenic shock.  Today, he is on milrinone 0.25, NE 8, vasopressin 0.03. Co-ox mildly low at 54%, CVP 14.  He has been started on CVVH pulling around 50 cc/hr net negative UF.  - Continue CVVH, aim for net negative UF 50-100 cc/hr today.  - Continue milrinone 0.25.  Would keep NE at 8, if able to wean pressor decrease vasopressin first.  Resend co-ox.  - Add midodrine 5 mg tid.  - With significant ILD and now renal failure, not an LVAD or transplant candidate.   2. VT: 01/13/21. Asymptomatic. 1 ATP -> 1 shock that was successful. Device interrogated this admission, no  VT.  3. Afib/ Atrial flutter: Admitted in 2/19 with severe HF decompensation in setting of atrial flutter with RVR.  He was cardioverted.  He then had atrial flutter ablation in 3/19. He is off amiodarone due to question of amiodarone lung toxicity. No palpitations.  He is back in atrial fibrillation with mild RVR this admission.  - Not candidate for amiodarone with concern for prior lung toxicity.    - Anticoagulation on hold for now with drop in hgb and +FOBT.  - Consider eventual DCCV but not yet.  4. CAD: S/p CABG.  Last intervention was PCI to LCx in 2016. Repeat cath (1/22) with occluded SVG-OM and SVG-RCA with patent LIMA-LAD and SVG-D, occluded proximal RCA, and newly occluded mid LCX. No targets for revascularization.  No chest pain but HS-TnI elevated to 3988 this admission.  I do not think this is true ACS, suspect demand ischemia with profound shock.  - Continue atorvastatin.  - ASA 81 for now.  5. Pulmonary fibrosis: PFTs in 2/19 were restrictive, and high resolution CT showed interstitial lung disease. Amiodarone was stopped due to concern for possible toxicity.  He has seen [name] => bronchoscopy suggestive of ILD related to amiodarone.  He has been on chronic prednisone.  High resolution CT chest repeated in 6/20, again showing ILD, described at UIP pattern. He has started Ofev. I think that a significant amount of his symptomatology is due to pulmonary fibrosis. Baseline on home oxygen.  - Stress dose steroids (hydrocortisone).  6. OSA: Uses CPAP.   7.  AKI on CKD stage 3: Possible ATN due to hypotension (sound like BP may have been low for a couple of weeks prior to admission based on symptoms). Still making some urine (550 cc yesterday).  - CVVH started, aim for 50-100 cc/hr net UF. 8. Pulmonary hypertension: Suspect group 3 PH due to lung disease (ILD). RHC (1/22) with moderate pulmonary arterial hypertension, PVR 4.2 WU. - He did not tolerate Tyvaso for PH-ILD. 9. DM2: He  did not tolerate [place].  10. Thrombocytopenia: Suspect related to critical illness, 70 K today.  He is not currently anticoagulated. 11. ID: Afebrile, he is on empiric ceftriaxone but no definite sign of infection.  12. GI bleeding: FOBT+ with hgb falling, down to 7.2.  - Anticoagulation on hold for now.  - PPI.  - Will give 1 unit PRBCs today.  13. RUE swelling: [place] negative for DVT.   CRITICAL CARE Performed by: Loralie Champagne  Total critical care time: 40 minutes  Critical care time was exclusive of separately billable procedures and treating other patients.  Critical care was necessary to treat or prevent imminent or life-threatening deterioration.  Critical care was time spent personally by me on the following activities: development of treatment plan with patient and/or surrogate as well as nursing, discussions with consultants, evaluation of patient's response to treatment, examination of patient, obtaining history from patient or surrogate, ordering and performing treatments and interventions, ordering and review of laboratory studies, ordering and review of radiographic studies, pulse oximetry and re-evaluation of patient's condition.  Loralie Champagne 04/07/2021 9:07 AM

## 2021-04-07 NOTE — Progress Notes (Signed)
Initial Nutrition Assessment  DOCUMENTATION CODES:   Non-severe (moderate) malnutrition in context of chronic illness  INTERVENTION:   - Ensure Enlive po TID, each supplement provides 350 kcal and 20 grams of protein  - Encourage PO intake  - B-complex with vitamin C to account for losses with CRRT  NUTRITION DIAGNOSIS:   Moderate Malnutrition related to chronic illness (CHF) as evidenced by moderate fat depletion, severe muscle depletion.  GOAL:   Patient will meet greater than or equal to 90% of their needs  MONITOR:   PO intake, Supplement acceptance, Labs, Weight trends, Skin, I & O's  REASON FOR ASSESSMENT:   Rounds (CRRT)    ASSESSMENT:   60 year old male who presented to the ED on 10/3 after syncopal episode. PMH of CHF, automatic cardioverter/defibrillator insertion, cardiomyopathy, atrial fibrillation, CAD s/p CABG, T2DM, HLD, GERD, anxiety, depression, CKD stage III. Pt admitted with cardiogenic shock, AKI on CKD, hyperkalemia, melena.  10/05 - CRRT initiated  Discussed pt with RN and during ICU rounds. Pt with decompensated ischemic cardiogenic shock with cardiorenal syndrome. Pt continues on CRRT. RN reports pt is eating very well. Pt remains on pressors but trying to wean.  Spoke with pt at bedside. He reports being very hungry after not eating for a few days. Pt states that his appetite has been decreased for several years but was even worse for the last 1 week PTA when he was really sick. Pt reports that he has been confined to the bed for the last week and also doesn't get around much at baseline.  Pt endorses weight loss over the course of the last 1.5-2 years. He reports losing at least 30 lbs over this timeframe. Pt's UBW is ~180 lbs. Reviewed weight history in chart. Current weight is likely falsely elevated secondary to volume overload. Pt's weight had been steadily decreasing over the last 2 years (92.3 kg on 04/07/19 to 73 kg on 03/16/21). Difficult to  determine dry weight at this time.  Admit weight: 75.1 kg Current weight: 79.9 kg  Meal Completion: 90-100%  Medications reviewed and include: IV solu-cortef, SSI q 4 hours, IV protonix, IV abx, milrinone drip, levophed drip, vasopressin drip  Labs reviewed: BUN 45, creatinine 2.59, corrected calcium 8.74, hemoglobin 7.2, platelets 70 CBG's: 142-189 x 24 hours  UOP: 550 ml x 24 hours CRRT UF: 1916 ml x 24 hours I/O's: -166 ml since admit  NUTRITION - FOCUSED PHYSICAL EXAM:  Flowsheet Row Most Recent Value  Orbital Region Moderate depletion  Upper Arm Region Severe depletion  Thoracic and Lumbar Region Moderate depletion  Buccal Region Moderate depletion  Temple Region Moderate depletion  Clavicle Bone Region Severe depletion  Clavicle and Acromion Bone Region Severe depletion  Scapular Bone Region Moderate depletion  Dorsal Hand Moderate depletion  Patellar Region Moderate depletion  Anterior Thigh Region Severe depletion  Posterior Calf Region Severe depletion  Edema (RD Assessment) Mild  Hair Reviewed  Eyes Reviewed  Mouth Reviewed  Skin Reviewed  Nails Reviewed       Diet Order:   Diet Order             Diet regular Room service appropriate? Yes; Fluid consistency: Thin  Diet effective now                   EDUCATION NEEDS:   Education needs have been addressed  Skin:  Skin Assessment: Skin Integrity Issues: DTI: sacrum  Last BM:  04/16/2021  Height:   Ht Readings from  Last 1 Encounters:  04/14/2021 5' 9" (1.753 m)    Weight:   Wt Readings from Last 1 Encounters:  04/07/21 79.9 kg    BMI:  Body mass index is 26.01 kg/m.  Estimated Nutritional Needs:   Kcal:  2200-2400  Protein:  130-150 grams  Fluid:  >/= 1.8 L    Gustavus Bryant, MS, RD, LDN Inpatient Clinical Dietitian Please see AMiON for contact information.

## 2021-04-07 NOTE — Progress Notes (Signed)
PT refused cpap for the night, instructed to call if he changes his mind.

## 2021-04-07 NOTE — Progress Notes (Signed)
[facility] Progress Note   Assessment/Plan **AKI on CKD:  Baseline Cr 1.5-2 CKD 3 now with severe AKI in setting of shock.  Suspect this is hemodynamically mediated AKI c/w cardiorenal.  Due to worsening azotemia proceeded 10/4 to CRRT though really didn't start CRRT until 10/5 due to catheter dysfunction.  Continues to make decent amount of urine but given tenuous status and severe AKI will continue CRRT today.     **Syncope/shock:  vol depletion + cardiogenic in origin.  ICD interrogation wth no VT.  Midodrine started in attempt to help liberate from IV support.   **HFrEF:  has chronic biV failure - component of cardiogenic shock and now on milrinone with improved hemodynamics. Heart failure consulting.  UF with CRRT as tolerated - would continue gentle UF today with net neg 50-100/hr.  Certainly can use diuretics as needed as well given fairly good UOP. Per notes not an LVAD or transplant candidate.   **Hyperkalemia:  K 4.6, use 4K dialysate for now.   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   **Anemia:  being transfused today  Will follow, call with concerns.    Vincent Hick MD 04/05/2021, 1:32 PM  [facility] Pager: [phone] ------------------------------------------------------------------------------------------------- Subjective:   HD catheter replaced yesterday and was able to tolerate CRRT thereafter.  Remains on NE 2-10, milrinone 0.25, vaso 0.03; midodrine started.  I/Os 900/ 2500, net neg 21m for admission per I/Os.  For transfusion today Hb 7  Objective Vitals:   04/07/21 0900 04/07/21 0915 04/07/21 0930 04/07/21 0936  BP: 91/60 (!) 90/57 100/63   Pulse: 95 (!) 110 (!) 110   Resp: (!) 24 (!) 22 17   Temp:  (!) 97.3 F (36.3 C)    TempSrc:  Oral    SpO2: 100% 100% 100% 100%  Weight:      Height:       Physical Exam General: chronically ill appearing lying at 45 degrees in no  distress Heart: RRR, no rub Lungs: clear ant basolateral crackles Abdomen: soft, nontender Extremities: no rash, toes appear less mottled. 1+ pitting diffuse edema Dialysis Access: LIJ temp HD cath.   Additional Objective Labs: Basic Metabolic Panel: Recent Labs  Lab 04/06/21 0500 04/06/21 1631 04/07/21 0313  NA 132* 134* 135  K 5.1 4.9 4.6  CL 99 102 101  CO2 19* 20* 22  GLUCOSE 189* 181* 167*  BUN 98* 73* 45*  CREATININE 5.29* 4.03* 2.59*  CALCIUM 7.8* 7.6* 7.7*  PHOS 7.5* 5.5* 3.2    Liver Function Tests: Recent Labs  Lab 04/21/2021 2022 04/05/21 0019 04/06/21 0130 04/06/21 0500 04/06/21 1631 04/07/21 0313  AST 22  --  33  --   --   --   ALT 12  --  22  --   --   --   ALKPHOS 38  --  61  --   --   --   BILITOT 0.4  --  0.9  --   --   --   PROT 3.3*  --  5.4*  --   --   --   ALBUMIN 1.7*   < > 2.7* 2.8* 2.7* 2.7*   < > = values in this interval not displayed.    No results for input(s): LIPASE, AMYLASE in the last 168 hours. CBC: Recent Labs  Lab 04/11/2021 1918 04/18/2021 1929 04/06/21 0130 04/06/21 0946 04/06/21 1417 04/06/21 2009 04/07/21 0313  WBC 6.5   < >  5.4 4.8 4.6 4.4 3.7*  NEUTROABS 4.9  --   --   --   --   --   --   HGB 11.5*   < > 9.4* 8.7* 8.1* 7.5* 7.2*  HCT 36.6*   < > 29.2* 26.5* 25.2* 23.0* 21.7*  MCV 128.0*   < > 121.7* 119.9* 120.6* 118.6* 120.6*  PLT PLATELET CLUMPS NOTED ON SMEAR, UNABLE TO ESTIMATE   < > 97* 94* 88* 81* 70*   < > = values in this interval not displayed.    Blood Culture    Component Value Date/Time   SDES BLOOD LEFT ANTECUBITAL 04/21/2021 1946   SDES BLOOD RIGHT FOREARM 04/17/2021 1946   SPECREQUEST  04/19/2021 1946    BOTTLES DRAWN AEROBIC AND ANAEROBIC Blood Culture results may not be optimal due to an inadequate volume of blood received in culture bottles   SPECREQUEST  04/23/2021 1946    BOTTLES DRAWN AEROBIC AND ANAEROBIC Blood Culture results may not be optimal due to an inadequate volume of blood  received in culture bottles   CULT  04/17/2021 1946    NO GROWTH 3 DAYS Performed at [facility], [place] [address], [place],  [address]    CULT  04/12/2021 1946    NO GROWTH 3 DAYS Performed at [facility] [address], [place],  [address]    REPTSTATUS PENDING 04/17/2021 1946   REPTSTATUS PENDING 04/05/2021 1946    Cardiac Enzymes: No results for input(s): CKTOTAL, CKMB, CKMBINDEX, TROPONINI in the last 168 hours. CBG: Recent Labs  Lab 04/06/21 1620 04/06/21 1936 04/06/21 2310 04/07/21 0334 04/07/21 0727  GLUCAP 142* 189* 187* 158* 145*    Iron Studies:  Recent Labs    04/06/21 0130  FERRITIN 355*    _0 @ Studies/Results: DG CHEST PORT 1 VIEW  Result Date: 04/06/2021 CLINICAL DATA:  Hemodialysis catheter replacement EXAM: PORTABLE CHEST 1 VIEW COMPARISON:  Portable exam 1100 hours compared to 04/05/2021 FINDINGS: New LEFT subclavian dual lumen central venous catheter with tip projecting over SVC near cavoatrial junction. LEFT subclavian ICD with lead projecting over RIGHT ventricle. Stimulator via RIGHT chest with lead in the RIGHT cervical region. Enlargement of cardiac silhouette post CABG. Persistent patchy infiltrates bilaterally, question edema versus infection. No pleural effusion or pneumothorax. IMPRESSION: No pneumothorax following LEFT jugular line placement. Enlargement of cardiac silhouette post CABG and ICD with persistent BILATERAL pulmonary infiltrates which could represent pulmonary edema or infection, little changed. Electronically Signed   By: Lavonia Dana M.D.   On: 04/06/2021 11:53   ECHOCARDIOGRAM COMPLETE  Result Date: 04/06/2021    ECHOCARDIOGRAM REPORT   Patient Name:   [facility] Date of Exam: 04/06/2021 Medical Rec #:  [id number]      Height:       69.0 in Accession #:    [id number]     Weight:       165.8 lb Date of Birth:  03/10/1961      BSA:          1.908 m Patient Age:    7 years       BP:            108/62 mmHg Patient Gender: M              HR:           118 bpm. Exam Location:  Inpatient Procedure: 2D Echo, Cardiac Doppler, Color Doppler and Intracardiac  Opacification Agent Indications:    CHF  History:        Patient has prior history of Echocardiogram examinations, most                 recent 09/19/2019. Cardiomyopathy and CHF, CAD, Arrythmias:Atrial                 Fibrillation; Risk Factors:Diabetes and Dyslipidemia.  Sonographer:    Maudry Mayhew MHA, RDMS, RVT, RDCS Referring Phys: [id number] SIMMONS  Sonographer Comments: Technically difficult study due to poor echo windows and no subcostal window. Patient on CRRT during echocardiogram. IMPRESSIONS  1. No apical thrombus with Definity contrast. Left ventricular ejection fraction, by estimation, is <20%. The left ventricle has severely decreased function. The left ventricle demonstrates global hypokinesis. Left ventricular diastolic function could not be evaluated.  2. Right ventricular systolic function is moderately reduced. The right ventricular size is mildly enlarged.  3. The mitral valve is abnormal. Trivial mitral valve regurgitation.  4. The aortic valve was not well visualized. Aortic valve regurgitation is not visualized. Mild aortic valve sclerosis is present, with no evidence of aortic valve stenosis. Comparison(s): Changes from prior study are noted. 09/19/2019: LVEF 20-25%. FINDINGS  Left Ventricle: No apical thrombus with Definity contrast. Left ventricular ejection fraction, by estimation, is <20%. The left ventricle has severely decreased function. The left ventricle demonstrates global hypokinesis. Definity contrast agent was given IV to delineate the left ventricular endocardial borders. The left ventricular internal cavity size was normal in size. There is no left ventricular hypertrophy. Left ventricular diastolic function could not be evaluated due to atrial fibrillation.  Left ventricular diastolic function  could not be evaluated. Right Ventricle: The right ventricular size is mildly enlarged. No increase in right ventricular wall thickness. Right ventricular systolic function is moderately reduced. Left Atrium: Left atrial size was normal in size. Right Atrium: Right atrial size was normal in size. Pericardium: There is no evidence of pericardial effusion. Mitral Valve: The mitral valve is abnormal. There is mild thickening of the mitral valve leaflet(s). Trivial mitral valve regurgitation. Tricuspid Valve: The tricuspid valve is grossly normal. Tricuspid valve regurgitation is mild. Aortic Valve: The aortic valve was not well visualized. Aortic valve regurgitation is not visualized. Mild aortic valve sclerosis is present, with no evidence of aortic valve stenosis. Aortic valve mean gradient measures 2.0 mmHg. Aortic valve peak gradient measures 4.2 mmHg. Aortic valve area, by VTI measures 2.78 cm. Pulmonic Valve: The pulmonic valve was not well visualized. Pulmonic valve regurgitation is not visualized. Aorta: The aortic root and ascending aorta are structurally normal, with no evidence of dilitation. IAS/Shunts: No atrial level shunt detected by color flow Doppler. Additional Comments: A device lead is visualized.  LEFT VENTRICLE PLAX 2D LVIDd:         5.30 cm     Diastology LVIDs:         5.00 cm     LV e' medial:    5.78 cm/s LV PW:         0.60 cm     LV E/e' medial:  21.5 LV IVS:        0.60 cm     LV e' lateral:   20.00 cm/s LVOT diam:     2.10 cm     LV E/e' lateral: 6.2 LV SV:         38 LV SV Index:   20 LVOT Area:     3.46 cm  LV Volumes (  MOD) LV vol d, MOD A4C: 80.1 ml LV vol s, MOD A4C: 68.3 ml LV SV MOD A4C:     80.1 ml RIGHT VENTRICLE RV S prime:     6.08 cm/s TAPSE (M-mode): 0.7 cm LEFT ATRIUM             Index LA diam:        3.00 cm 1.57 cm/m LA Vol (A2C):   58.2 ml 30.51 ml/m LA Vol (A4C):   40.1 ml 21.02 ml/m LA Biplane Vol: 52.1 ml 27.31 ml/m  AORTIC VALVE AV Area (Vmax):    2.91 cm AV Area  (Vmean):   2.97 cm AV Area (VTI):     2.78 cm AV Vmax:           102.00 cm/s AV Vmean:          63.500 cm/s AV VTI:            0.136 m AV Peak Grad:      4.2 mmHg AV Mean Grad:      2.0 mmHg LVOT Vmax:         85.80 cm/s LVOT Vmean:        54.400 cm/s LVOT VTI:          0.109 m LVOT/AV VTI ratio: 0.80  AORTA Ao Root diam: 2.90 cm MITRAL VALVE                TRICUSPID VALVE MV Area (PHT): 5.20 cm     TR Peak grad:   22.3 mmHg MV Decel Time: 146 msec     TR Vmax:        236.00 cm/s MV E velocity: 124.00 cm/s                             SHUNTS                             Systemic VTI:  0.11 m                             Systemic Diam: 2.10 cm Lyman Bishop MD Electronically signed by Lyman Bishop MD Signature Date/Time: 04/06/2021/5:02:29 PM    Final    VAS [place] UPPER EXTREMITY VENOUS DUPLEX  Result Date: 04/06/2021 UPPER VENOUS STUDY  Patient Name:  JADIAN KARMAN [facility]  Date of Exam:   04/06/2021 Medical Rec #: [id number]       Accession #:    [id number] Date of Birth: 07-23-1960       Patient Gender: M Patient Age:   22 years Exam Location:  [facility] Procedure:      VAS [place] UPPER EXTREMITY VENOUS DUPLEX Referring Phys: Ria Comment [facility] --------------------------------------------------------------------------------  Indications: Swelling Risk Factors: None identified. Limitations: Poor ultrasound/tissue interface, bandages, line, open wound and Dialysis. Comparison Study: No prior studies. Performing Technologist: Oliver Hum RVT  Examination Guidelines: A complete evaluation includes B-mode imaging, spectral Doppler, color Doppler, and power Doppler as needed of all accessible portions of each vessel. Bilateral testing is considered an integral part of a complete examination. Limited examinations for reoccurring indications may be performed as noted.  Right Findings: +----------+------------+---------+-----------+----------+-------+ RIGHT     CompressiblePhasicitySpontaneousPropertiesSummary  +----------+------------+---------+-----------+----------+-------+ Subclavian    Full       Yes       Yes                      +----------+------------+---------+-----------+----------+-------+  Left Findings: +----------+------------+---------+-----------+----------+-------+ LEFT      CompressiblePhasicitySpontaneousPropertiesSummary +----------+------------+---------+-----------+----------+-------+ Subclavian    Full       Yes       Yes                      +----------+------------+---------+-----------+----------+-------+ Axillary      Full       Yes       Yes                      +----------+------------+---------+-----------+----------+-------+ Brachial      Full       Yes       Yes                      +----------+------------+---------+-----------+----------+-------+ Radial        Full                                          +----------+------------+---------+-----------+----------+-------+ Ulnar         Full                                          +----------+------------+---------+-----------+----------+-------+ Cephalic      Full                                          +----------+------------+---------+-----------+----------+-------+ Basilic       Full                                          +----------+------------+---------+-----------+----------+-------+  Summary:  Right: No evidence of thrombosis in the subclavian.  Left: No evidence of deep vein thrombosis in the upper extremity. No evidence of superficial vein thrombosis in the upper extremity.  *See table(s) above for measurements and observations.  Diagnosing physician: Harold Barban MD Electronically signed by Harold Barban MD on 04/06/2021 at 4:52:35 PM.    Final    Medications:   prismasol BGK 4/2.5 400 mL/hr at 04/07/21 0003    prismasol BGK 4/2.5 300 mL/hr at 04/07/21 0324   sodium chloride     sodium chloride     cefTRIAXone (ROCEPHIN)  IV Stopped (04/06/21 2030)    milrinone 0.25 mcg/kg/min (04/07/21 0900)   norepinephrine (LEVOPHED) Adult infusion 8 mcg/min (04/07/21 0900)   prismasol BGK 4/2.5 1,500 mL/hr at 04/07/21 0707   vasopressin 0.03 Units/min (04/07/21 0900)    sodium chloride   Intravenous Once   alteplase  2 mg Intracatheter Once   alteplase  2 mg Intracatheter Once   aspirin EC  81 mg Oral Daily   atorvastatin  40 mg Oral Daily   Chlorhexidine Gluconate Cloth  6 each Topical Daily   heparin injection (subcutaneous)  5,000 Units Subcutaneous Q8H   hydrocortisone sod succinate (SOLU-CORTEF) inj  100 mg Intravenous Q8H   insulin aspart  0-9 Units Subcutaneous Q4H   mouth rinse  15 mL Mouth Rinse BID   midodrine  5 mg Oral TID WC   mometasone-formoterol  2 puff Inhalation BID   pantoprazole (PROTONIX) IV  40  mg Intravenous Q12H   sodium chloride flush  10-40 mL Intracatheter Q12H

## 2021-04-07 NOTE — Progress Notes (Signed)
NAME:  Vincent Chandler, MRN:  [id number], DOB:  [birth year], LOS: 3 ADMISSION DATE:  04/02/2021, CONSULTATION DATE: 04/19/2021 REFERRING MD: EDP, CHIEF COMPLAINT: Syncope  Brief History   Vincent Chandler is a 60 year old male with past medical history significant for HFrEF, cardiomyopathy, AICD, atrial flutter on Eliquis, CAD, ILD on 3 L home O2 and OSA on CPAP, type 2 diabetes, GERD and hiatal hernia who presented to the ED after a syncopal episode while walking to the bathroom, sat down in his bed and then woke up on the floor.  He reports several weeks of lower extremity weakness and generally not feeling well with dizziness over the last several days along with dark stools.  He also reports nausea and constipation, denies any chest pain, palpitations, fevers, abdominal pain, diarrhea, new cough or URI symptoms.   On EMS arrival, patient was awake and alert though hypotensive.  He was given 500 cc IV fluid bolus.  In the ED, head and C-spine CTs were negative for acute findings, CXR without any infiltrate.  I-STAT was significant for potassium of 8.5, creatinine 5.7, hemoglobin 12.   He was given additional 500 cc LR and nephrology was consulted.  He has been urinating, reports decreased p.o. intake over several days.  He was given insulin, calcium, albuterol, and Lokelma and additional IV fluid ordered.  Lactic acid was 4.  Lab run BMP is pending.  PCCM consulted in the setting.  Past Medical History  HFrEF (EF of 25-30%) s/p AICD  CAD s/p CABG x4 Intermittent Afib  HLD OSA on CPAP ILD on chronic steroid  T2DM CKD Stage 3  Significant Hospital Events   10/03 - consulted for hyperkalemia and hypotension and admitted to [facility] 10/04 -advanced heart failure team consulted, central line placed left IJ and left more, right femoral A-line placed 10/04 - tried to initiate CRRT without success due to HD line problems.  10/05 - HD cath inserted in Rt IJ and resumed CRRT  Consults:  Advanced heart  failure team Nephrology   Procedures:  10/04 -HD/CVC left IJ and left femoral, arterial line placed right femoral 10/05 - Lft IJ HD cath removed and replacement a new HD cath.   Significant Diagnostic Tests:  Echocardiogram Result Date: 09/2019 1. Left ventricular ejection fraction, by estimation, is 20 to 25%. The left ventricle has severely decreased function. The left ventricle demonstrates global hypokinesis. The left ventricular internal cavity size was mildly dilated. There is mild left ventricular hypertrophy. Left ventricular diastolic parameters are consistent with Grade I diastolic dysfunction (impaired relaxation). 2. Right ventricular systolic function is moderately reduced. The right ventricular size is mildly enlarged. Tricuspid regurgitation signal is inadequate for assessing PA pressure. 3. The mitral valve is normal in structure. Trivial mitral valve regurgitation. No evidence of mitral stenosis. 4. The aortic valve is tricuspid. Aortic valve regurgitation is not visualized. No aortic stenosis is present. 5. The inferior vena cava is normal in size with <50% respiratory variability, suggesting right atrial pressure of 8 mmHg.  CT Chest High Resolution: Result Date: 08.12/2019 IMPRESSION: 1. Spectrum of findings compatible with basilar predominant fibrotic interstitial lung disease with probable early honeycombing. Findings have clearly progressed in the interval. Findings are consistent with UIP per consensus guidelines: Diagnosis of Idiopathic Pulmonary Fibrosis: An Official ATS/ERS/JRS/ALAT Clinical Practice Guideline. [place], Iss 5, [id number], Mar 03 2017. 2. Stable mild cardiomegaly. 3. Aberrant nonaneurysmal right subclavian artery. 4. Aortic Atherosclerosis (ICD10-I70.0).  DG Chest 2  View Result Date: 04/21/2021 FINDINGS: Multiple sternal wires and vascular clips are seen. A single lead ventricular pacer is noted. A radiopaque  stimulator and associated stimulator wire are seen overlying the lateral aspect of the mid right lung. Mild atelectasis is seen within the bilateral lung bases. There is no evidence of a pleural effusion or pneumothorax. The heart size and mediastinal contours are within normal limits. The visualized skeletal structures are unremarkable.  IMPRESSION: 1. Evidence of prior median sternotomy/CABG. 2. Mild bibasilar atelectasis.    CT Head Wo Contrast Result Date: 04/08/2021 FINDINGS: Brain: There is mild cerebral atrophy with widening of the extra-axial spaces and ventricular dilatation. There are areas of decreased attenuation within the white matter tracts of the supratentorial brain, consistent with microvascular disease changes. Vascular: No hyperdense vessel or unexpected calcification. Skull: Normal. Negative for fracture or focal lesion. Sinuses/Orbits: There is marked severity right maxillary sinus, bilateral ethmoid sinus, sphenoid sinus and frontal sinus mucosal thickening. Postoperative changes are noted along the medial walls of the bilateral maxillary sinuses. Other: None.  IMPRESSION: 1. No acute intracranial abnormality. 2. Generalized cerebral atrophy and microvascular disease changes of the supratentorial brain. 3. Marked severity paranasal sinus disease.    CT Cervical Spine Wo Contrast Result Date: 04/29/2021 FINDINGS: Alignment: Normal. Skull base and vertebrae: No acute fracture. No primary bone lesion or focal pathologic process. Soft tissues and spinal canal: No prevertebral fluid or swelling. No visible canal hematoma. A metallic density stimulator wire is noted within the neck soft tissues on the right. Disc levels: Mild multilevel endplate sclerosis is seen throughout the cervical spine. There is mild to moderate severity narrowing of the anterior atlantoaxial articulation. Mild multilevel intervertebral disc space narrowing is seen. Moderate severity bilateral multilevel facet joint  hypertrophy is noted. This is most prominent at the level of C2-C3. Upper chest: Negative. Other: None.  IMPRESSION: 1. No acute osseous abnormality of the cervical spine. 2. Mild multilevel degenerative changes, as described above. E  DG CHEST PORT 1 VIEW  Result Date: 04/05/2021 FINDINGS: Interval placement of left IJ dialysis catheter to the proximal aspect of the innominate vein. No pneumothorax. Stable left subclavian AICD. Slight increase in patchy interstitial and airspace opacities involving bases more than apices. Heart size upper limits normal. CABG markers. No definite effusion. Implanted stimulator device projects over the right hemithorax, leads directed cephalad. Sternotomy wires.  IMPRESSION: 1. Left IJ catheter placement to the proximal innominate vein. No pneumothorax. 2. Slight worsening of bilateral infiltrates or edema.     ICD Interrogated:  Result Date: 04/05/2021 -Short run of NSVT in September -Intermittent atrial fibrillation  Echocardiogram:  Result Date: 04/06/2021  1. No apical thrombus with Definity contrast. Left ventricular ejection fraction, by estimation, is <20%. The left ventricle has severely decreased function. The left ventricle demonstrates global hypokinesis. Left ventricular diastolic function could not be evaluated.   2. Right ventricular systolic function is moderately reduced. The right ventricular size is mildly enlarged.   3. The mitral valve is abnormal. Trivial mitral valve regurgitation.   4. The aortic valve was not well visualized. Aortic valve regurgitation is not visualized. Mild aortic valve sclerosis is present, with no evidence of aortic valve stenosis.   Vas [place] Upper Extremity: Result Date: 04/07/2021 Right: No evidence of thrombosis in the subclavian.  Left: No evidence of deep vein thrombosis in the upper extremity. No evidence of superficial vein thrombosis in the upper extremity.  Micro Data:  10/03 - COVD19/Influ negative  10/03  -  MRSA PCR - negative  10/03 - BCx NG @ 2d  Antimicrobials:  10/03 Ceftriaxone>10/07  Interim History/Subjective:  Patient resting in bed.  He denies any symptoms of chest pain or shortness of breath.  He states that he is feeling very cold but otherwise denies any additional symptoms.  He continues to have some urine overnight but overall feels okay  Objective:  Blood pressure 101/72, pulse 92, temperature 97.7 F (36.5 C), temperature source Axillary, resp. rate 12, height _0  (1.753 m), weight 79.9 kg, SpO2 100 %. CVP:  [1 mmHg-46 mmHg] 10 mmHg  FiO2 (%):  [32 %] 32 %   Intake/Output Summary (Last 24 hours) at 04/07/2021 0629 Last data filed at 04/07/2021 0500 Gross per 24 hour  Intake 821.25 ml  Output 2678 ml  Net -1856.75 ml   Filed Weights   04/05/21 0500 04/06/21 0315 04/07/21 0500  Weight: 75.1 kg 75.2 kg 79.9 kg    Examination: General: Alert and oriented HENT: PERRLA and normal extraocular eye motions, HD line in left IJ clean and dry Lungs: Clear to auscultation bilaterally Cardiovascular: Tachycardic, no murmurs rubs or gallops Abdomen: Nondistended nontender Extremities: Extremities feel warmer with improved distal pulses, but anasarca present Neuro: No focal neurologic deficits GU: No Foley catheter in place  Resolved Hospital Problem list     Assessment & Plan:  Vincent Chandler is a 60 year old male with past medical history significant for HFrEF, cardiomyopathy, AICD, atrial flutter on Eliquis, CAD, ILD on 3 L home O2 and OSA on CPAP, type 2 diabetes, GERD and hiatal hernia who presented to the ED after a syncopal episode while walking to the bathroom and admitted for cardiogenic shock 2/2 to underlying cardiopulmonary disease and complicated cardiorenal syndrome on CRRT.  Acute on chronic HFrEF (EF <20%) Cardiogenic shock  Syncope: Patient has mildly improved pressor requirements, but his Co-ox was 54% with a CVP of 10.  We will start midodrine and  continue gentle CRRT CV has further improvement.   -We will continue to monitor Co-ox and CVP -Continue to hold BB/CBB -Continue Levophed, milrinone vasopressin and titrate when possible -Start midodrine 5 mg 3 times daily -Keep MAPs > 65  -Continue to pull -50 CRRT - Keep K > 4 and Mg >2 -Advanced heart failure team and Nephrology consulted and appreciate their assistance  NSTEMI CAD s/p CABG x4 Patient has a significant history of CAD and hyperlipidemia.  Came with an elevated troponin likely NSTEMI in the setting of cardiogenic shock.  -Defer procedure to cardiology  Cardiorenal Syndrome: Hx of Stage Stage 3:  Hyperkalemia: Patient was unable to start CRRT yesterday due to difficulties with the left central line.  He did however have 750 cc of urine output overnight.  He will likely still require CRRT, but will need another HD cath placed prior.  He remains hyperkalemic at 5.1 and will need lokelma today.  His kidney function remains stable with a creatinine of 5.29 and GFR fof 12 -Nephrology consulted and appreciate their assistance -Hold off on RRT until new HD cath placed - Will give a dose of lokelma for hyperkalemia -Avoid nephrotoxic medications when possible -Continue monitoring kidney function and urine output daily  Pancytopenia: Patient pancytopenia with hemoglobin of 7.2.  Likely secondary to critical illness, CKD/AKI, and blood loss from multiple procedures on anticoagulation. -We will give 1 unit PRBC -Check post transfusion hgb - Goal hgb >8.0  Intermittent Aflutter on Eliquis: -We will hold anticoagulation in setting of worsening hemoglobin -Continue to  hold BB/CCB   IPF: Patient has a history of ILD/PF on chronic steroids thought to be secondary to IPF/amiodarone induced. - Will start stress dosed steroids    T2DM:  - Q4 hours CBG  - SSI  Goal CBG of 120-180  Best Practice:  Diet: Regular consistency (see orders) Pain/Anxiety/Delirium protocol (if  indicated): N/A VAP protocol (if indicated): N/A DVT prophylaxis: SCD GI prophylaxis: N/A Glucose control: SSI Lines: Lines: Central line, Dialysis Catheter, and Arterial Line Foley: Foley:  Yes, and it is still needed Mobility: bed rest Code Status: full code Family Communication: pending Disposition: ICU  Labs   CBC: Recent Labs  Lab 04/09/2021 1918 04/11/2021 1929 04/06/21 0130 04/06/21 0946 04/06/21 1417 04/06/21 2009 04/07/21 0313  WBC 6.5   < > 5.4 4.8 4.6 4.4 3.7*  NEUTROABS 4.9  --   --   --   --   --   --   HGB 11.5*   < > 9.4* 8.7* 8.1* 7.5* 7.2*  HCT 36.6*   < > 29.2* 26.5* 25.2* 23.0* 21.7*  MCV 128.0*   < > 121.7* 119.9* 120.6* 118.6* 120.6*  PLT PLATELET CLUMPS NOTED ON SMEAR, UNABLE TO ESTIMATE   < > 97* 94* 88* 81* 70*   < > = values in this interval not displayed.    Basic Metabolic Panel: Recent Labs  Lab 04/02/2021 2022 04/28/2021 2140 04/05/21 0019 04/05/21 1255 04/05/21 1800 04/06/21 0130 04/06/21 0500 04/06/21 1631 04/07/21 0313  NA 139   < > 135   < > 132* 130* 132* 134* 135  K 3.6   < > 5.4*   < > 5.4* 5.2* 5.1 4.9 4.6  CL 121*   < > 99   < > 97* 101 99 102 101  CO2 8*   < > 16*   < > 16* 16* 19* 20* 22  GLUCOSE 76   < > 118*   < > 179* 226* 189* 181* 167*  BUN 68*   < > 106*   < > 103* 98* 98* 73* 45*  CREATININE 3.01*   < > 5.12*   < > 5.51* 5.25* 5.29* 4.03* 2.59*  CALCIUM 4.8*   < > 8.6*   < > 8.4* 7.4* 7.8* 7.6* 7.7*  MG 1.2*  --  2.6*  --   --  2.2  --   --  2.2  PHOS  --   --  7.8*  --  7.7*  --  7.5* 5.5* 3.2   < > = values in this interval not displayed.   GFR: Estimated Creatinine Clearance: 30.7 mL/min (A) (by C-G formula based on SCr of 2.59 mg/dL (H)). Recent Labs  Lab 05/01/2021 1944 04/30/2021 2219 04/05/21 0019 04/05/21 0208 04/05/21 1550 04/06/21 0130 04/06/21 0946 04/06/21 1417 04/06/21 2009 04/07/21 0313  PROCALCITON  --   --  1.62  --   --  1.50  --   --   --  0.26  WBC  --   --  6.4  --   --  5.4 4.8 4.6 4.4 3.7*   LATICACIDVEN 4.6* 3.7*  --  3.2* 1.5  --   --   --   --   --     Liver Function Tests: Recent Labs  Lab 04/09/2021 2022 04/05/21 0019 04/05/21 1800 04/06/21 0130 04/06/21 0500 04/06/21 1631 04/07/21 0313  AST 22  --   --  33  --   --   --   ALT 12  --   --  22  --   --   --   ALKPHOS 38  --   --  61  --   --   --   BILITOT 0.4  --   --  0.9  --   --   --   PROT 3.3*  --   --  5.4*  --   --   --   ALBUMIN 1.7*   < > 3.0* 2.7* 2.8* 2.7* 2.7*   < > = values in this interval not displayed.   No results for input(s): LIPASE, AMYLASE in the last 168 hours. No results for input(s): AMMONIA in the last 168 hours.  ABG    Component Value Date/Time   PHART 7.392 07/24/2018 0722   PCO2ART 34.3 07/24/2018 0722   PO2ART 94.7 07/24/2018 0722   HCO3 23.2 07/29/2020 1154   HCO3 22.5 07/29/2020 1154   TCO2 16 (L) 04/29/2021 1929   ACIDBASEDEF 3.0 (H) 07/29/2020 1154   ACIDBASEDEF 4.0 (H) 07/29/2020 1154   O2SAT 54.1 04/07/2021 0313     Coagulation Profile: Recent Labs  Lab 04/06/21 0130  INR 1.4*    Cardiac Enzymes: No results for input(s): CKTOTAL, CKMB, CKMBINDEX, TROPONINI in the last 168 hours.  HbA1C: Hgb A1c MFr Bld  Date/Time Value Ref Range Status  04/05/2021 12:19 AM 5.5 4.8 - 5.6 % Final    Comment:    (NOTE) Pre diabetes:          5.7%-6.4%  Diabetes:              >6.4%  Glycemic control for   <7.0% adults with diabetes   06/14/2020 02:02 PM 6.8 (H) 4.6 - 6.5 % Final    Comment:    Glycemic Control Guidelines for People with Diabetes:Non Diabetic:  <6%Goal of Therapy: <7%Additional Action Suggested:  >8%     CBG: Recent Labs  Lab 04/06/21 1139 04/06/21 1620 04/06/21 1936 04/06/21 2310 04/07/21 0334  GLUCAP 143* 142* 189* 187* 158*    Review of Systems:   See above  Past Medical History  He,  has a past medical history of AICD (automatic cardioverter/defibrillator) present, Anginal pain ([place]), Anxiety, Asthma, Atrial fibrillation-postoperative  (11/28/2012), Cardiomyopathy, CHF (congestive heart failure) ([place]), Chronic back pain, Coronary artery disease, Depression, Diabetes mellitus type II (dx'd in the 1990's), GERD (gastroesophageal reflux disease), H/O hiatal hernia, Hemorrhoids, History of hypogonadism, History of kidney stones, Hyperlipidemia, Hypotension, OSA on CPAP, Pneumonia, and Urinary incontinence.   Surgical History    Past Surgical History:  Procedure Laterality Date   A-FLUTTER ABLATION N/A 09/12/2017   Procedure: A-FLUTTER ABLATION;  Surgeon: Deboraha Sprang, MD;  Location: [place] [facility];  Service: Cardiovascular;  Laterality: N/A;   CARDIAC DEFIBRILLATOR PLACEMENT  01/15/2015   CARDIOVERSION N/A 08/10/2017   Procedure: CARDIOVERSION;  Surgeon: Larey Dresser, MD;  Location: [facility] ENDOSCOPY;  Service: Cardiovascular;  Laterality: N/A;   CORONARY ANGIOPLASTY WITH STENT PLACEMENT  ~ 2003   CORONARY ARTERY BYPASS GRAFT N/A 08/15/2012   Procedure: CORONARY ARTERY BYPASS GRAFTING (CABG);  Surgeon: Ivin Poot, MD;  Location: [place];  Service: Open Heart Surgery;  Laterality: N/A;  Coronary Artery Bypass Grafting Times Four Using Left Internal Mammary Artery and Right Saphenous Leg Vein Harvested Endoscopically   EP IMPLANTABLE DEVICE N/A 01/15/2015   Procedure: ICD Implant;  Surgeon: Deboraha Sprang, MD;  Location: [place] [facility];  Service: Cardiovascular;  Laterality: N/A;   LEFT HEART CATHETERIZATION WITH CORONARY ANGIOGRAM N/A 08/08/2012  Procedure: LEFT HEART CATHETERIZATION WITH CORONARY ANGIOGRAM;  Surgeon: Peter M Martinique, MD;  Location[facility] C[facility] LAB;  Service: Cardiovascular;  Laterality: N/A;   LEFT HEART CATHETERIZATION WITH CORONARY/GRAFT ANGIOGRAM N/A 07/21/2014   Procedure: LEFT HEART CATHETERIZATION WITH Beatrix Fetters;  Surgeon: Larey Dresser, MD;  Location: [facility] CATH LAB;  Service: Cardiovascular;  Laterality: N/A;   MULTIPLE EXTRACTIONS WITH ALVEOLOPLASTY N/A 10/04/2017   Procedure: Extraction of  tooth #'s 5 and 14 with alveoloplasty and gross debridement of remaining teeth;  Surgeon: [name], DDS;  Location: [place];  Service: Oral Surgery;  Laterality: N/A;   PERCUTANEOUS CORONARY STENT INTERVENTION (PCI-S)  07/21/2014   Procedure: PERCUTANEOUS CORONARY STENT INTERVENTION (PCI-S);  Surgeon: Larey Dresser, MD;  Location: [facility] CATH LAB;  Service: Cardiovascular;;   REFRACTIVE SURGERY Bilateral 1990's   RIGHT HEART CATH N/A 10/03/2017   Procedure: RIGHT HEART CATH;  Surgeon: Larey Dresser, MD;  Location: [place] [facility];  Service: Cardiovascular;  Laterality: N/A;   RIGHT HEART CATH N/A 12/11/2018   Procedure: RIGHT HEART CATH;  Surgeon: Larey Dresser, MD;  Location: [place] [facility];  Service: Cardiovascular;  Laterality: N/A;   RIGHT/LEFT HEART CATH AND CORONARY/GRAFT ANGIOGRAPHY N/A 07/29/2020   Procedure: RIGHT/LEFT HEART CATH AND CORONARY/GRAFT ANGIOGRAPHY;  Surgeon: Larey Dresser, MD;  Location: [place] [facility];  Service: Cardiovascular;  Laterality: N/A;   TEE WITHOUT CARDIOVERSION N/A 08/10/2017   Procedure: TRANSESOPHAGEAL ECHOCARDIOGRAM (TEE);  Surgeon: Larey Dresser, MD;  Location: [facility] ENDOSCOPY;  Service: Cardiovascular;  Laterality: N/A;   TONSILLECTOMY  ~ [place] Bilateral 11/28/2017   Procedure: VIDEO BRONCHOSCOPY WITHOUT FLUORO;  Surgeon: Brand Males, MD;  Location: [facility] ENDOSCOPY;  Service: Endoscopy;  Laterality: Bilateral;     Social History   reports that he has never smoked. His smokeless tobacco use includes chew. He reports current alcohol use of about 3.0 standard drinks per week. He reports that he does not use drugs.   Family History   His family history includes Diabetes in his father and mother; Hyperlipidemia in his father; Hypertension in his father and mother.   Allergies No Known Allergies   Home Medications  Prior to Admission medications   Medication Sig Start Date End Date Taking? Authorizing Provider   ACCU-CHEK FASTCLIX LANCETS MISC Use as directed once a day.  Dx code: E11.9 09/19/17  Yes Ann Held, DO  acetaminophen (TYLENOL) 500 MG tablet Take 1,000 mg by mouth every 6 (six) hours as needed for moderate pain or headache.   Yes [provider]  albuterol (ACCUNEB) 0.63 MG/3ML nebulizer solution Take 3 mLs (0.63 mg total) by nebulization every 6 (six) hours as needed for wheezing. 12/04/18  Yes Fenton Foy, NP  albuterol (VENTOLIN HFA) 108 (90 Base) MCG/ACT inhaler Inhale 2 puffs into the lungs every 6 (six) hours as needed for wheezing or shortness of breath.    Yes [provider]  allopurinol (ZYLOPRIM) 300 MG tablet Take 1 tablet (300 mg total) by mouth daily. 03/28/21  Yes Silverio Decamp, MD  apixaban (ELIQUIS) 5 MG TABS tablet TAKE 1 TABLET BY MOUTH 2 TIMES DAILY. Patient taking differently: Take 5 mg by mouth 2 (two) times daily. 08/03/20 08/03/21 Yes Larey Dresser, MD  atorvastatin (LIPITOR) 40 MG tablet Take 1 tablet (40 mg total) by mouth at bedtime. 02/22/21 02/22/22 Yes Milford, Maricela Bo, FNP  blood glucose meter kit and supplies Dispense based on  patient and insurance preference. Use as directed once a day. Dx Code E11.9. 09/06/17  Yes [name], Alferd Apa, DO  Blood Glucose Monitoring Suppl (ACCU-CHEK GUIDE) w/Device KIT 1 each by Does not apply route daily. DX Code: E11.9 09/19/17  Yes Lowne Lyndal Pulley R, DO  carvedilol (COREG) 3.125 MG tablet Take 1 tablet (3.125 mg total) by mouth 2 (two) times daily. 07/27/20  Yes Larey Dresser, MD  glucose blood (ACCU-CHEK GUIDE) test strip Use as directed once a day.  Dx code: E11.9 09/19/17  Yes Ann Held, DO  guaiFENesin (MUCINEX) 600 MG 12 hr tablet Take 1 tablet (600 mg total) by mouth 2 (two) times daily as needed for to loosen phlegm. 08/17/17  Yes Shirley Friar, PA-C  metFORMIN (GLUCOPHAGE) 500 MG tablet Take 1 tablet (500 mg total) by mouth 2 (two) times daily with a meal.  10/20/20  Yes Shamleffer, Melanie Crazier, MD  nitroGLYCERIN (NITROSTAT) 0.4 MG SL tablet Place 1 tablet (0.4 mg total) under the tongue every 5 (five) minutes as needed for chest pain. 07/21/20  Yes Larey Dresser, MD  OXYGEN Inhale 3 L into the lungs continuous.   Yes [provider]  Pirfenidone (ESBRIET) 267 MG TABS Take 1 tab three times daily for 7 days, then 2 tabs three times daily for 7 days, then 3 tabs three times daily thereafter. Patient taking differently: Take 3 tablets by mouth daily. 03/02/21  Yes Brand Males, MD  predniSONE (DELTASONE) 10 MG tablet TAKE 1 TABLET (10 MG TOTAL) BY MOUTH DAILY WITH BREAKFAST. 02/23/21  Yes Larey Dresser, MD  sacubitril-valsartan (ENTRESTO) 24-26 MG TAKE 1 TABLET BY MOUTH 2 (TWO) TIMES DAILY. 04/06/20 04/30/21 Yes Larey Dresser, MD  spironolactone (ALDACTONE) 25 MG tablet Take 0.5 tablets (12.5 mg total) by mouth daily. 02/22/21 05/23/21 Yes Milford, Maricela Bo, FNP  torsemide (DEMADEX) 20 MG tablet Take 40 mg by mouth daily. 03/16/21  Yes [provider]  fluticasone-salmeterol (ADVAIR HFA) 230-21 MCG/ACT inhaler Inhale 2 puffs into the lungs 2 (two) times daily. Patient not taking: Reported on 04/06/2021 11/26/20   Martyn Ehrich, NP  torsemide 40 MG TABS Take 40 mg by mouth daily. Patient not taking: Reported on 04/22/2021 03/16/21   Rafael Bihari, FNP     Critical care time: [place], D.O.  Internal Medicine Resident, PGY-3 [facility]  Pager: [phone] 6:29 AM, 04/07/2021

## 2021-04-07 NOTE — TOC Initial Note (Addendum)
Transition of Care [facility]) - Initial/Assessment Note    Patient Details  Name: Vincent Chandler MRN: [id number] Date of Birth: 11-20-1960  Transition of Care [facility]) CM/SW Contact:    [place], [place] Phone Number: 04/07/2021, 3:47 PM  Clinical Narrative:                 HF CSW spoke with the patient at bedside and completed a very brief SDOH with Mr. Marrow who denied having any needs at this time. Mr. Bisson reported he does have a PCP and he can get to the pharmacy to pick up his medications. Mr. Vane reported that he has a roommate and doesn't live alone and [facility] [address] and [facility] outpatient pharmacy. CSW discussed DME needs with Mr. Pokorski including needing a shower chair and rollator at time of discharge. Pending PT/OT consult. CSW provided the patient with the social workers name and position and if anything changes to please reach out so that the CSW can provide support.  CSW will continue to follow throughout discharge.    Barriers to Discharge: Continued Medical Work up   Patient Goals and CMS Choice        Expected Discharge Plan and Services   In-house Referral: Clinical Social Work     Living arrangements for the past 2 months: Single Family Home                                      Prior Living Arrangements/Services Living arrangements for the past 2 months: Single Family Home  Patient language and need for interpreter reviewed:: Yes        Need for Family Participation in Patient Care: No (Comment) Care giver support system in place?: No (comment)   Criminal Activity/Legal Involvement Pertinent to Current Situation/Hospitalization: No - Comment as needed  Activities of Daily Living      Permission Sought/Granted                  Emotional Assessment Appearance:: Appears stated age  Affect (typically observed): Pleasant Orientation: : Oriented to Self, Oriented to Place, Oriented to  Time, Oriented to Situation    Psych Involvement: No (comment)  Admission diagnosis:  Hyperkalemia [E87.5] Hypotension, unspecified hypotension type [I95.9] Acute renal failure, unspecified acute renal failure type ([place]) [N17.9] Gastrointestinal hemorrhage, unspecified gastrointestinal hemorrhage type [K92.2] Patient Active Problem List   Diagnosis Date Noted   Hyperkalemia 04/19/2021   Gastrointestinal hemorrhage    Hypotension    Cellulitis of left lower extremity 12/30/2020   Weight loss 12/30/2020   DM (diabetes mellitus) type II, controlled, with peripheral vascular disorder ([place]) 12/23/2020   Gout 07/07/2020   Polyarthritis 07/06/2020   Olecranon bursitis of right elbow 06/14/2020   Type 2 diabetes mellitus with diabetic polyneuropathy, without long-term current use of insulin ([place]) 10/09/2019   Type 2 diabetes mellitus with stage 3b chronic kidney disease, without long-term current use of insulin ([place]) 10/09/2019   Diabetes mellitus ([place]) 10/09/2019   Chronic respiratory failure with hypoxia ([name]) 04/07/2019   Type 2 diabetes mellitus with stage 3 chronic kidney disease, without long-term current use of insulin ([place]) 02/26/2019   Uncontrolled type 2 diabetes mellitus with hyperglycemia ([place]) 02/11/2019   ILD (interstitial lung disease) ([place])    Chronic periodontitis 08/30/2017   Atrial flutter ([place]) 08/08/2017   Dyspnea 08/07/2017   Hyperglycemia 08/07/2017   CKD (chronic kidney  disease) 2-3 07/19/2014   NSTEMI (non-ST elevated myocardial infarction) ([place]) 07/18/2014   OSA (obstructive sleep apnea) 07/18/2014   Tachycardia    V tach 07/17/2014   Hypoxemia 07/17/2014   Acute renal failure ([place]) 06/05/2012   CHF (congestive heart failure) ([place]) 06/04/2012   Sinus tachycardia 12/29/2010   Coronary artery disease prior RCA stent with new inferior Q waves 10/18/2010   Ischemic and nonischemic cardiomyopathy   10/18/2010   Uncontrolled type 2 diabetes mellitus without complication, without long-term  current use of insulin 03/10/2010   Hyperlipidemia 03/10/2010   Essential hypertension 03/10/2010   PCP:  Ann Held, DO Pharmacy:   [id number] [place] [place]AMard[address] SpeakNAl[address]Phone: [phone]: [address]Phone: [phone]: [phone] [place] [address]Phone: [phone]: [phone] [facility] [address] [place]Phone: [phone]: [phone]    Social Determinants of Health (SDOH) Interventions Food Insecurity Interventions: Intervention Not Indicated Financial Strain Interventions: Intervention Not Indicated Housing Interventions: Intervention Not Indicated Transportation Interventions: Intervention Not Indicated  Readmission Risk Interventions No flowsheet data found.  Tayley Mudrick, MSW, LPrairie GroveHeart Failure Social Worker

## 2021-04-08 DIAGNOSIS — K922 Gastrointestinal hemorrhage, unspecified: Secondary | ICD-10-CM | POA: Diagnosis not present

## 2021-04-08 DIAGNOSIS — I959 Hypotension, unspecified: Secondary | ICD-10-CM | POA: Diagnosis not present

## 2021-04-08 DIAGNOSIS — N178 Other acute kidney failure: Secondary | ICD-10-CM | POA: Diagnosis not present

## 2021-04-08 DIAGNOSIS — E440 Moderate protein-calorie malnutrition: Secondary | ICD-10-CM

## 2021-04-08 DIAGNOSIS — R570 Cardiogenic shock: Secondary | ICD-10-CM | POA: Diagnosis not present

## 2021-04-08 DIAGNOSIS — N179 Acute kidney failure, unspecified: Secondary | ICD-10-CM | POA: Diagnosis not present

## 2021-04-08 DIAGNOSIS — I5023 Acute on chronic systolic (congestive) heart failure: Secondary | ICD-10-CM | POA: Diagnosis not present

## 2021-04-08 LAB — COOXEMETRY PANEL
Carboxyhemoglobin: 1.6 % — ABNORMAL HIGH (ref 0.5–1.5)
Methemoglobin: 1 % (ref 0.0–1.5)
O2 Saturation: 61.8 %
Total hemoglobin: 7.6 g/dL — ABNORMAL LOW (ref 12.0–16.0)

## 2021-04-08 LAB — RENAL FUNCTION PANEL
Albumin: 2.9 g/dL — ABNORMAL LOW (ref 3.5–5.0)
Albumin: 3.2 g/dL — ABNORMAL LOW (ref 3.5–5.0)
Anion gap: 9 (ref 5–15)
Anion gap: 8 (ref 5–15)
BUN: 21 mg/dL — ABNORMAL HIGH (ref 6–20)
BUN: 16 mg/dL (ref 6–20)
CO2: 26 mmol/L (ref 22–32)
CO2: 24 mmol/L (ref 22–32)
Calcium: 8 mg/dL — ABNORMAL LOW (ref 8.9–10.3)
Calcium: 7.9 mg/dL — ABNORMAL LOW (ref 8.9–10.3)
Chloride: 102 mmol/L (ref 98–111)
Chloride: 103 mmol/L (ref 98–111)
Creatinine, Ser: 1.27 mg/dL — ABNORMAL HIGH (ref 0.61–1.24)
Creatinine, Ser: 1.66 mg/dL — ABNORMAL HIGH (ref 0.61–1.24)
GFR, Estimated: >60 mL/min (ref >60)
GFR, Estimated: 47 mL/min — ABNORMAL LOW (ref >60)
Glucose, Bld: 161 mg/dL — ABNORMAL HIGH (ref 70–99)
Glucose, Bld: 229 mg/dL — ABNORMAL HIGH (ref 70–99)
Phosphorus: 1.4 mg/dL — ABNORMAL LOW (ref 2.5–4.6)
Phosphorus: 3.1 mg/dL (ref 2.5–4.6)
Potassium: 4.2 mmol/L (ref 3.5–5.1)
Potassium: 4.2 mmol/L (ref 3.5–5.1)
Sodium: 137 mmol/L (ref 135–145)
Sodium: 135 mmol/L (ref 135–145)

## 2021-04-08 LAB — BPAM RBC
Blood Product Expiration Date: 202210122359
ISSUE DATE / TIME: 202210060848
Unit Type and Rh: 6200

## 2021-04-08 LAB — GLUCOSE, CAPILLARY
Glucose-Capillary: 129 mg/dL — ABNORMAL HIGH (ref 70–99)
Glucose-Capillary: 162 mg/dL — ABNORMAL HIGH (ref 70–99)
Glucose-Capillary: 214 mg/dL — ABNORMAL HIGH (ref 70–99)
Glucose-Capillary: 225 mg/dL — ABNORMAL HIGH (ref 70–99)
Glucose-Capillary: 228 mg/dL — ABNORMAL HIGH (ref 70–99)
Glucose-Capillary: 234 mg/dL — ABNORMAL HIGH (ref 70–99)

## 2021-04-08 LAB — CBC
HCT: 23.4 % — ABNORMAL LOW (ref 39.0–52.0)
Hemoglobin: 7.6 g/dL — ABNORMAL LOW (ref 13.0–17.0)
MCH: 37.1 pg — ABNORMAL HIGH (ref 26.0–34.0)
MCHC: 32.5 g/dL (ref 30.0–36.0)
MCV: 114.1 fL — ABNORMAL HIGH (ref 80.0–100.0)
Platelets: 59 10*3/uL — ABNORMAL LOW (ref 150–400)
RBC: 2.05 MIL/uL — ABNORMAL LOW (ref 4.22–5.81)
WBC: 3.8 10*3/uL — ABNORMAL LOW (ref 4.0–10.5)
nRBC: 1.1 % — ABNORMAL HIGH (ref 0.0–0.2)

## 2021-04-08 LAB — TYPE AND SCREEN
ABO/RH(D): A POS
Antibody Screen: NEGATIVE
Unit division: 0

## 2021-04-08 LAB — FERRITIN: Ferritin: 319 ng/mL (ref 24–336)

## 2021-04-08 LAB — FOLATE: Folate: 6.5 ng/mL (ref >5.9)

## 2021-04-08 LAB — MAGNESIUM: Magnesium: 2.4 mg/dL (ref 1.7–2.4)

## 2021-04-08 LAB — PHOSPHORUS: Phosphorus: 2.7 mg/dL (ref 2.5–4.6)

## 2021-04-08 LAB — VITAMIN B12: Vitamin B-12: 323 pg/mL (ref 180–914)

## 2021-04-08 MED ORDER — NOREPINEPHRINE BITARTRATE 1 MG/ML IV SOLN
0.0000 ug/min | INTRAVENOUS | Status: DC
  Administered 2021-04-08 (×2): 1 ug/min via INTRAVENOUS
  Filled 2021-04-08 (×2): qty 16

## 2021-04-08 MED ORDER — NOREPINEPHRINE 4 MG/250ML-% IV SOLN: 0.0000 ug/min | INTRAVENOUS | Status: DC

## 2021-04-08 MED ORDER — PANTOPRAZOLE SODIUM 40 MG IV SOLR
40.0000 mg | Freq: Every day | INTRAVENOUS | Status: DC
  Administered 2021-04-09 – 2021-04-12 (×4): 40 mg via INTRAVENOUS
  Filled 2021-04-08 (×4): qty 40

## 2021-04-08 MED ORDER — MIDODRINE HCL 5 MG PO TABS
10.0000 mg | ORAL_TABLET | Freq: Three times a day (TID) | ORAL | Status: DC
  Administered 2021-04-08 – 2021-04-14 (×19): 10 mg via ORAL
  Filled 2021-04-08 (×19): qty 2

## 2021-04-08 MED ORDER — MIDODRINE HCL 5 MG PO TABS: 10.0000 mg | ORAL_TABLET | Freq: Three times a day (TID) | ORAL | Status: DC

## 2021-04-08 MED ORDER — SODIUM PHOSPHATES 45 MMOLE/15ML IV SOLN
45.0000 mmol | Freq: Once | INTRAVENOUS | Status: AC
  Administered 2021-04-08: 45 mmol via INTRAVENOUS
  Filled 2021-04-08: qty 15

## 2021-04-08 MED ORDER — CHLORHEXIDINE GLUCONATE CLOTH 2 % EX PADS: 6.0000 | MEDICATED_PAD | Freq: Every day | CUTANEOUS | Status: DC

## 2021-04-08 NOTE — Progress Notes (Addendum)
NAME:  Vincent Chandler, MRN:  [id number], DOB:  01/12/1961, LOS: 4 ADMISSION DATE:  04/07/2021, CONSULTATION DATE: 04/07/2021 REFERRING MD: EDP, CHIEF COMPLAINT: Syncope  Brief History   Ray Gervasi is a 60 year old male with past medical history significant for HFrEF, cardiomyopathy, AICD, atrial flutter on Eliquis, CAD, ILD on 3 L home O2 and OSA on CPAP, type 2 diabetes, GERD and hiatal hernia who presented to the ED after a syncopal episode while walking to the bathroom, sat down in his bed and then woke up on the floor.  He reports several weeks of lower extremity weakness and generally not feeling well with dizziness over the last several days along with dark stools.  He also reports nausea and constipation, denies any chest pain, palpitations, fevers, abdominal pain, diarrhea, new cough or URI symptoms.   On EMS arrival, patient was awake and alert though hypotensive.  He was given 500 cc IV fluid bolus.  In the ED, head and C-spine CTs were negative for acute findings, CXR without any infiltrate.  I-STAT was significant for potassium of 8.5, creatinine 5.7, hemoglobin 12.   He was given additional 500 cc LR and nephrology was consulted.  He has been urinating, reports decreased p.o. intake over several days.  He was given insulin, calcium, albuterol, and Lokelma and additional IV fluid ordered.  Lactic acid was 4.  Lab run BMP is pending.  PCCM consulted in the setting.  Past Medical History  HFrEF (EF of 25-30%) s/p AICD  CAD s/p CABG x4 Intermittent Afib  HLD OSA on CPAP ILD on chronic steroid  T2DM CKD Stage 3  Significant Hospital Events   10/03 - consulted for hyperkalemia and hypotension and admitted to [facility] 10/04 -advanced heart failure team consulted, central line placed left IJ and left more, right femoral A-line placed 10/04 - tried to initiate CRRT without success due to HD line problems.  10/05 - HD cath inserted in Rt IJ and resumed CRRT 10/06 - started midodrine and  weaned off levo  Consults:  Advanced heart failure team Nephrology   Procedures:  10/04 -HD/CVC left IJ and left femoral, arterial line placed right femoral 10/05 - Lft IJ HD cath removed and replacement a new HD cath.   Significant Diagnostic Tests:  Echocardiogram Result Date: 09/2019 1. Left ventricular ejection fraction, by estimation, is 20 to 25%. The left ventricle has severely decreased function. The left ventricle demonstrates global hypokinesis. The left ventricular internal cavity size was mildly dilated. There is mild left ventricular hypertrophy. Left ventricular diastolic parameters are consistent with Grade I diastolic dysfunction (impaired relaxation). 2. Right ventricular systolic function is moderately reduced. The right ventricular size is mildly enlarged. Tricuspid regurgitation signal is inadequate for assessing PA pressure. 3. The mitral valve is normal in structure. Trivial mitral valve regurgitation. No evidence of mitral stenosis. 4. The aortic valve is tricuspid. Aortic valve regurgitation is not visualized. No aortic stenosis is present. 5. The inferior vena cava is normal in size with <50% respiratory variability, suggesting right atrial pressure of 8 mmHg.  CT Chest High Resolution: Result Date: 08.12/2019 IMPRESSION: 1. Spectrum of findings compatible with basilar predominant fibrotic interstitial lung disease with probable early honeycombing. Findings have clearly progressed in the interval. Findings are consistent with UIP per consensus guidelines: Diagnosis of Idiopathic Pulmonary Fibrosis: An Official ATS/ERS/JRS/ALAT Clinical Practice Guideline. [place], Iss 5, [phone], Mar 03 2017. 2. Stable mild cardiomegaly. 3. Aberrant nonaneurysmal right subclavian artery.  4. Aortic Atherosclerosis (ICD10-I70.0).  DG Chest 2 View Result Date: 04/30/2021 FINDINGS: Multiple sternal wires and vascular clips are seen. A single  lead ventricular pacer is noted. A radiopaque stimulator and associated stimulator wire are seen overlying the lateral aspect of the mid right lung. Mild atelectasis is seen within the bilateral lung bases. There is no evidence of a pleural effusion or pneumothorax. The heart size and mediastinal contours are within normal limits. The visualized skeletal structures are unremarkable.  IMPRESSION: 1. Evidence of prior median sternotomy/CABG. 2. Mild bibasilar atelectasis.    CT Head Wo Contrast Result Date: 04/20/2021 FINDINGS: Brain: There is mild cerebral atrophy with widening of the extra-axial spaces and ventricular dilatation. There are areas of decreased attenuation within the white matter tracts of the supratentorial brain, consistent with microvascular disease changes. Vascular: No hyperdense vessel or unexpected calcification. Skull: Normal. Negative for fracture or focal lesion. Sinuses/Orbits: There is marked severity right maxillary sinus, bilateral ethmoid sinus, sphenoid sinus and frontal sinus mucosal thickening. Postoperative changes are noted along the medial walls of the bilateral maxillary sinuses. Other: None.  IMPRESSION: 1. No acute intracranial abnormality. 2. Generalized cerebral atrophy and microvascular disease changes of the supratentorial brain. 3. Marked severity paranasal sinus disease.    CT Cervical Spine Wo Contrast Result Date: 04/14/2021 FINDINGS: Alignment: Normal. Skull base and vertebrae: No acute fracture. No primary bone lesion or focal pathologic process. Soft tissues and spinal canal: No prevertebral fluid or swelling. No visible canal hematoma. A metallic density stimulator wire is noted within the neck soft tissues on the right. Disc levels: Mild multilevel endplate sclerosis is seen throughout the cervical spine. There is mild to moderate severity narrowing of the anterior atlantoaxial articulation. Mild multilevel intervertebral disc space narrowing is seen.  Moderate severity bilateral multilevel facet joint hypertrophy is noted. This is most prominent at the level of C2-C3. Upper chest: Negative. Other: None.  IMPRESSION: 1. No acute osseous abnormality of the cervical spine. 2. Mild multilevel degenerative changes, as described above. E  DG CHEST PORT 1 VIEW  Result Date: 04/05/2021 FINDINGS: Interval placement of left IJ dialysis catheter to the proximal aspect of the innominate vein. No pneumothorax. Stable left subclavian AICD. Slight increase in patchy interstitial and airspace opacities involving bases more than apices. Heart size upper limits normal. CABG markers. No definite effusion. Implanted stimulator device projects over the right hemithorax, leads directed cephalad. Sternotomy wires.  IMPRESSION: 1. Left IJ catheter placement to the proximal innominate vein. No pneumothorax. 2. Slight worsening of bilateral infiltrates or edema.     ICD Interrogated:  Result Date: 04/05/2021 -Short run of NSVT in September -Intermittent atrial fibrillation  Echocardiogram:  Result Date: 04/06/2021  1. No apical thrombus with Definity contrast. Left ventricular ejection fraction, by estimation, is <20%. The left ventricle has severely decreased function. The left ventricle demonstrates global hypokinesis. Left ventricular diastolic function could not be evaluated.   2. Right ventricular systolic function is moderately reduced. The right ventricular size is mildly enlarged.   3. The mitral valve is abnormal. Trivial mitral valve regurgitation.   4. The aortic valve was not well visualized. Aortic valve regurgitation is not visualized. Mild aortic valve sclerosis is present, with no evidence of aortic valve stenosis.   Vas [place] Upper Extremity: Result Date: 04/07/2021 Right: No evidence of thrombosis in the subclavian.  Left: No evidence of deep vein thrombosis in the upper extremity. No evidence of superficial vein thrombosis in the upper extremity.   Micro Data:  10/03 - COVD19/Influ negative  10/03 - MRSA PCR - negative  10/03 - BCx NG @ 2d  Antimicrobials:  10/03 Ceftriaxone>10/07  Interim History/Subjective:  Patient resting in bed.  He states that he was only able to get a little bit of sleep overnight.  He has had some difficulty feeling cold and shivering but states that he feels warmer today since getting the PRBC transfusion yesterday.  Otherwise, he denies any chest pain, shortness of breath, fevers, chills, palpitations.   Objective:  Blood pressure 100/70, pulse 91, temperature 97.6 F (36.4 C), temperature source Oral, resp. rate 16, height _0  (1.753 m), weight 79.9 kg, SpO2 99 %. CVP:  [13 mmHg-22 mmHg] 13 mmHg      Intake/Output Summary (Last 24 hours) at 04/08/2021 0545 Last data filed at 04/08/2021 0500 Gross per 24 hour  Intake 1001.45 ml  Output 2204 ml  Net -1202.55 ml   Filed Weights   04/05/21 0500 04/06/21 0315 04/07/21 0500  Weight: 75.1 kg 75.2 kg 79.9 kg    Examination: General: Alert and oriented HENT: PERRLA and normal extraocular eye motions, HD line in left IJ clean and dry Lungs: Clear to auscultation bilaterally Cardiovascular: Intermittently tachycardic, no murmurs rubs or gallops Abdomen: Nondistended nontender Extremities: Extremities feel warmer with improved distal pulses, 1-2+ LE edema to his  Neuro: No focal neurologic deficits GU: No Foley catheter in place, but endorses good urine output  Patient Lines/Drains/Airways Status     Active Line/Drains/Airways     Name Placement date Placement time Site Days   Arterial Line 04/05/21 Right Femoral 04/05/21  1000  Femoral  3   CVC Triple Lumen 04/05/21 Left Femoral 04/05/21  1530  -- 3   Hemodialysis Catheter Left Internal jugular Triple lumen Temporary (Non-Tunneled) 04/06/21  1025  Internal jugular  2   Incision (Closed) 07/24/18 Neck Right 07/24/18  0849  -- 989   Pressure Injury 04/05/21 Sacrum Deep Tissue Pressure Injury -  Purple or maroon localized area of discolored intact skin or blood-filled blister due to damage of underlying soft tissue from pressure and/or shear. 04/05/21  0015  -- 3   Wound / Incision (Open or Dehisced) 04/07/21 Skin tear Arm Lower;Posterior;Right 04/07/21  0700  Arm  1            Resolved Hospital Problem list     Assessment & Plan:  Shloma Roggenkamp is a 60 year old male with past medical history significant for HFrEF, cardiomyopathy, AICD, atrial flutter on Eliquis, CAD, ILD on 3 L home O2 and OSA on CPAP, type 2 diabetes, GERD and hiatal hernia who presented to the ED after a syncopal episode while walking to the bathroom and admitted for cardiogenic shock 2/2 to underlying cardiopulmonary disease and complicated cardiorenal syndrome on CRRT.  Acute on chronic HFrEF (EF <20%) Cardiogenic shock  Syncope: Patient weaned off NE. remains on milrinone and vasopressin.  Blood pressure stable since initiating midodrine yesterday.  Co-ox this morning is 61% which is roughly stable from yesterday with a CVP of 13.  He had roughly 2.2 L output last 24 hours with 300 cc being urine output.  Patient's short-term prognosis is guarded with likely poor long-term prognosis.  He would likely benefit from a palliative care consult for additional resources in the outpatient setting. - Continue to monitor Co-ox and CVP -Continue to hold BB/CBB -Continue milrinone and vasopressin, titrate when possible -New midodrine 5 mg 3 times daily, consider increasing dose to 10 mg -Keep MAPs >  5  -Consider holding CRRT today and starting IV diuresis versus gentle fluid removal with CRRT - Keep K > 4 and Mg >2 -Advanced heart failure team and Nephrology consulted and appreciate their assistance  NSTEMI, Type 2 CAD s/p CABG x4 Patient has a significant history of CAD status post CABG x4 with repeat cath recently back in January 2022 showing new occlusions in the RCA and left circumflex not amenable to PCI.   Patient presented with significantly elevated troponin likely secondary to type II NSTEMI in the setting of cardiogenic shock.  He remains chest pain-free and at baseline shortness of breath. -Defer further procedure to cardiology -Continue ASA 81 mg and statin  Cardiorenal Syndrome: Hx of Stage Stage 3:  Hyperkalemia: Patient has significantly improved creatinine of 1.27 from 1.70 and a GFR greater than 60 from 46 .  He had only minimal urine output overnight of 175 cc.  And he is net -1.2 L since admission.  Will likely hold CRRT in the near future in favor of restarting oral diuretics.   -Nephrology consulted and appreciate their assistance -Likely hold CRRT today -Avoid nephrotoxic medications when possible -Continue monitoring kidney function and urine output daily  Pancytopenia: Macrocytic anemia with elevated RDW Thrombocytopenia with bleeding from IV Leukopenia: Patient continues to have pancytopenia with a white blood cell count of 3.8, hemoglobin of 7.6, and a platelet count of 59.  He is status post 1 unit PRBC transfusion with improvement of his hemoglobin yesterday.  Has since dropped down below 8.  Due to his significant history of ongoing CAD and poor/tenuous cardiac output, he would likely benefit from transfusion goal of a Hgb of 8.  Etiology likely secondary to critical illness, CKD/AKI, malnutrition and blood loss from multiple procedures on anticoagulation. -We will need to hold heparin for DVT prophylaxis as platelet count is 59 with signs of bleeding -B12, folate, ferritin - Goal hgb >8.0  Intermittent Aflutter on Eliquis: -We will hold anticoagulation in setting of worsening hemoglobin -Continue to hold BB/CCB   IPF: Patient has a history of ILD/PF on chronic steroids thought to be secondary to IPF/amiodarone induced.  He is on stress dose steroids but will likely discontinue with improvement of hemodynamics in the near future. -Continue stress dose steroids, DC  once hemodynamics improve   T2DM:  - Q4 hours CBG  - SSI  Goal CBG of 120-180  Best Practice:  Diet: Regular consistency (see orders) Pain/Anxiety/Delirium protocol (if indicated): N/A VAP protocol (if indicated): N/A DVT prophylaxis: SCD GI prophylaxis: N/A Glucose control: SSI Lines: Lines: Central line, Dialysis Catheter, and Arterial Line Foley: Foley:  Yes, and it is still needed Mobility: bed rest Code Status: full code Family Communication: pending Disposition: ICU  Labs   CBC: Recent Labs  Lab 04/11/2021 1918 04/18/2021 1929 04/06/21 1417 04/06/21 2009 04/07/21 0313 04/07/21 1537 04/08/21 0413  WBC 6.5   < > 4.6 4.4 3.7* 4.4 3.8*  NEUTROABS 4.9  --   --   --   --   --   --   HGB 11.5*   < > 8.1* 7.5* 7.2* 8.0* 7.6*  HCT 36.6*   < > 25.2* 23.0* 21.7* 25.2* 23.4*  MCV 128.0*   < > 120.6* 118.6* 120.6* 113.5* 114.1*  PLT PLATELET CLUMPS NOTED ON SMEAR, UNABLE TO ESTIMATE   < > 88* 81* 70* 70* 59*   < > = values in this interval not displayed.    Basic Metabolic Panel: Recent Labs  Lab  04/16/2021 2022 04/10/2021 2140 04/05/21 0019 04/05/21 1255 04/06/21 0130 04/06/21 0500 04/06/21 1631 04/07/21 0313 04/07/21 1537 04/08/21 0413  NA 139   < > 135   < > 130* 132* 134* 135 136 137  K 3.6   < > 5.4*   < > 5.2* 5.1 4.9 4.6 4.6 4.2  CL 121*   < > 99   < > 101 99 102 101 104 102  CO2 8*   < > 16*   < > 16* 19* 20* _0 GLUCOSE 76   < > 118*   < > 226* 189* 181* 167* 234* 161*  BUN 68*   < > 106*   < > 98* 98* 73* 45* 27* 21*  CREATININE 3.01*   < > 5.12*   < > 5.25* 5.29* 4.03* 2.59* 1.70* 1.27*  CALCIUM 4.8*   < > 8.6*   < > 7.4* 7.8* 7.6* 7.7* 7.9* 8.0*  MG 1.2*  --  2.6*  --  2.2  --   --  2.2  --  2.4  PHOS  --   --  7.8*   < >  --  7.5* 5.5* 3.2 2.4* 1.4*   < > = values in this interval not displayed.   GFR: Estimated Creatinine Clearance: 62.6 mL/min (A) (by C-G formula based on SCr of 1.27 mg/dL (H)). Recent Labs  Lab 04/18/2021 1944 04/06/2021 2219  04/05/21 0019 04/05/21 0208 04/05/21 1550 04/06/21 0130 04/06/21 0946 04/06/21 2009 04/07/21 0313 04/07/21 1537 04/08/21 0413  PROCALCITON  --   --  1.62  --   --  1.50  --   --  0.26  --   --   WBC  --   --  6.4  --   --  5.4   < > 4.4 3.7* 4.4 3.8*  LATICACIDVEN 4.6* 3.7*  --  3.2* 1.5  --   --   --   --   --   --    < > = values in this interval not displayed.    Liver Function Tests: Recent Labs  Lab 04/22/2021 2022 04/05/21 0019 04/06/21 0130 04/06/21 0500 04/06/21 1631 04/07/21 0313 04/07/21 1537 04/08/21 0413  AST 22  --  33  --   --   --   --   --   ALT 12  --  22  --   --   --   --   --   ALKPHOS 38  --  61  --   --   --   --   --   BILITOT 0.4  --  0.9  --   --   --   --   --   PROT 3.3*  --  5.4*  --   --   --   --   --   ALBUMIN 1.7*   < > 2.7* 2.8* 2.7* 2.7* 2.9* 2.9*   < > = values in this interval not displayed.   No results for input(s): LIPASE, AMYLASE in the last 168 hours. No results for input(s): AMMONIA in the last 168 hours.  ABG    Component Value Date/Time   PHART 7.392 07/24/2018 0722   PCO2ART 34.3 07/24/2018 0722   PO2ART 94.7 07/24/2018 0722   HCO3 23.2 07/29/2020 1154   HCO3 22.5 07/29/2020 1154   TCO2 16 (L) 04/18/2021 1929   ACIDBASEDEF 3.0 (H) 07/29/2020 1154   ACIDBASEDEF 4.0 (H) 07/29/2020 1154   O2SAT  61.8 04/08/2021 0413     Coagulation Profile: Recent Labs  Lab 04/06/21 0130  INR 1.4*    Cardiac Enzymes: No results for input(s): CKTOTAL, CKMB, CKMBINDEX, TROPONINI in the last 168 hours.  HbA1C: Hgb A1c MFr Bld  Date/Time Value Ref Range Status  04/05/2021 12:19 AM 5.5 4.8 - 5.6 % Final    Comment:    (NOTE) Pre diabetes:          5.7%-6.4%  Diabetes:              >6.4%  Glycemic control for   <7.0% adults with diabetes   06/14/2020 02:02 PM 6.8 (H) 4.6 - 6.5 % Final    Comment:    Glycemic Control Guidelines for People with Diabetes:Non Diabetic:  <6%Goal of Therapy: <7%Additional Action Suggested:  >8%      CBG: Recent Labs  Lab 04/07/21 1134 04/07/21 1525 04/07/21 1917 04/07/21 2315 04/08/21 0418  GLUCAP 216* 229* 279* 225* 162*    Review of Systems:   See above  Past Medical History  He,  has a past medical history of AICD (automatic cardioverter/defibrillator) present, Anginal pain ([place]), Anxiety, Asthma, Atrial fibrillation-postoperative (11/28/2012), Cardiomyopathy, CHF (congestive heart failure) ([place]), Chronic back pain, Coronary artery disease, Depression, Diabetes mellitus type II (dx'd in the 1990's), GERD (gastroesophageal reflux disease), H/O hiatal hernia, Hemorrhoids, History of hypogonadism, History of kidney stones, Hyperlipidemia, Hypotension, OSA on CPAP, Pneumonia, and Urinary incontinence.   Surgical History    Past Surgical History:  Procedure Laterality Date   A-FLUTTER ABLATION N/A 09/12/2017   Procedure: A-FLUTTER ABLATION;  Surgeon: Deboraha Sprang, MD;  Location: [facility];  Service: Cardiovascular;  Laterality: N/A;   CARDIAC DEFIBRILLATOR PLACEMENT  01/15/2015   CARDIOVERSION N/A 08/10/2017   Procedure: CARDIOVERSION;  Surgeon: Larey Dresser, MD;  Location: [facility] ENDOSCOPY;  Service: Cardiovascular;  Laterality: N/A;   CORONARY ANGIOPLASTY WITH STENT PLACEMENT  ~ 2003   CORONARY ARTERY BYPASS GRAFT N/A 08/15/2012   Procedure: CORONARY ARTERY BYPASS GRAFTING (CABG);  Surgeon: Ivin Poot, MD;  Location: [facility];  Service: Open Heart Surgery;  Laterality: N/A;  Coronary Artery Bypass Grafting Times Four Using Left Internal Mammary Artery and Right Saphenous Leg Vein Harvested Endoscopically   EP IMPLANTABLE DEVICE N/A 01/15/2015   Procedure: ICD Implant;  Surgeon: Deboraha Sprang, MD;  Location: [place];  Service: Cardiovascular;  Laterality: N/A;   LEFT HEART CATHETERIZATION WITH CORONARY ANGIOGRAM N/A 08/08/2012   Procedure: LEFT HEART CATHETERIZATION WITH CORONARY ANGIOGRAM;  Surgeon: Peter M Martinique, MD;  Location[facility] C[facility] LAB;  Service:  Cardiovascular;  Laterality: N/A;   LEFT HEART CATHETERIZATION WITH CORONARY/GRAFT ANGIOGRAM N/A 07/21/2014   Procedure: LEFT HEART CATHETERIZATION WITH Beatrix Fetters;  Surgeon: Larey Dresser, MD;  Location: [facility] CATH LAB;  Service: Cardiovascular;  Laterality: N/A;   MULTIPLE EXTRACTIONS WITH ALVEOLOPLASTY N/A 10/04/2017   Procedure: Extraction of tooth #'s 5 and 14 with alveoloplasty and gross debridement of remaining teeth;  Surgeon: [name], DDS;  Location: [place];  Service: Oral Surgery;  Laterality: N/A;   PERCUTANEOUS CORONARY STENT INTERVENTION (PCI-S)  07/21/2014   Procedure: PERCUTANEOUS CORONARY STENT INTERVENTION (PCI-S);  Surgeon: Larey Dresser, MD;  Location: [facility] CATH LAB;  Service: Cardiovascular;;   REFRACTIVE SURGERY Bilateral 1990's   RIGHT HEART CATH N/A 10/03/2017   Procedure: RIGHT HEART CATH;  Surgeon: Larey Dresser, MD;  Location: [place] [facility];  Service: Cardiovascular;  Laterality: N/A;  RIGHT HEART CATH N/A 12/11/2018   Procedure: RIGHT HEART CATH;  Surgeon: Larey Dresser, MD;  Location: [place] [facility];  Service: Cardiovascular;  Laterality: N/A;   RIGHT/LEFT HEART CATH AND CORONARY/GRAFT ANGIOGRAPHY N/A 07/29/2020   Procedure: RIGHT/LEFT HEART CATH AND CORONARY/GRAFT ANGIOGRAPHY;  Surgeon: Larey Dresser, MD;  Location: [facility];  Service: Cardiovascular;  Laterality: N/A;   TEE WITHOUT CARDIOVERSION N/A 08/10/2017   Procedure: TRANSESOPHAGEAL ECHOCARDIOGRAM (TEE);  Surgeon: Larey Dresser, MD;  Location: [facility] ENDOSCOPY;  Service: Cardiovascular;  Laterality: N/A;   TONSILLECTOMY  ~ [place] Bilateral 11/28/2017   Procedure: VIDEO BRONCHOSCOPY WITHOUT FLUORO;  Surgeon: Brand Males, MD;  Location: [facility] ENDOSCOPY;  Service: Endoscopy;  Laterality: Bilateral;     Social History   reports that he has never smoked. His smokeless tobacco use includes chew. He reports current alcohol use of about 3.0 standard drinks  per week. He reports that he does not use drugs.   Family History   His family history includes Diabetes in his father and mother; Hyperlipidemia in his father; Hypertension in his father and mother.   Allergies No Known Allergies   Home Medications  Prior to Admission medications   Medication Sig Start Date End Date Taking? Authorizing Provider  ACCU-CHEK FASTCLIX LANCETS MISC Use as directed once a day.  Dx code: E11.9 09/19/17  Yes Ann Held, DO  acetaminophen (TYLENOL) 500 MG tablet Take 1,000 mg by mouth every 6 (six) hours as needed for moderate pain or headache.   Yes [provider]  albuterol (ACCUNEB) 0.63 MG/3ML nebulizer solution Take 3 mLs (0.63 mg total) by nebulization every 6 (six) hours as needed for wheezing. 12/04/18  Yes Fenton Foy, NP  albuterol (VENTOLIN HFA) 108 (90 Base) MCG/ACT inhaler Inhale 2 puffs into the lungs every 6 (six) hours as needed for wheezing or shortness of breath.    Yes [provider]  allopurinol (ZYLOPRIM) 300 MG tablet Take 1 tablet (300 mg total) by mouth daily. 03/28/21  Yes Silverio Decamp, MD  apixaban (ELIQUIS) 5 MG TABS tablet TAKE 1 TABLET BY MOUTH 2 TIMES DAILY. Patient taking differently: Take 5 mg by mouth 2 (two) times daily. 08/03/20 08/03/21 Yes Larey Dresser, MD  atorvastatin (LIPITOR) 40 MG tablet Take 1 tablet (40 mg total) by mouth at bedtime. 02/22/21 02/22/22 Yes [place], Maricela Bo, FNP  blood glucose meter kit and supplies Dispense based on patient and insurance preference. Use as directed once a day. Dx Code E11.9. 09/06/17  Yes [name], Alferd Apa, DO  Blood Glucose Monitoring Suppl (ACCU-CHEK GUIDE) w/Device KIT 1 each by Does not apply route daily. DX Code: E11.9 09/19/17  Yes Lowne Lyndal Pulley R, DO  carvedilol (COREG) 3.125 MG tablet Take 1 tablet (3.125 mg total) by mouth 2 (two) times daily. 07/27/20  Yes Larey Dresser, MD  glucose blood (ACCU-CHEK GUIDE) test strip Use as directed  once a day.  Dx code: E11.9 09/19/17  Yes Ann Held, DO  guaiFENesin (MUCINEX) 600 MG 12 hr tablet Take 1 tablet (600 mg total) by mouth 2 (two) times daily as needed for to loosen phlegm. 08/17/17  Yes Shirley Friar, PA-C  metFORMIN (GLUCOPHAGE) 500 MG tablet Take 1 tablet (500 mg total) by mouth 2 (two) times daily with a meal. 10/20/20  Yes Shamleffer, Melanie Crazier, MD  nitroGLYCERIN (NITROSTAT) 0.4 MG SL tablet Place 1 tablet (0.4 mg total)  under the tongue every 5 (five) minutes as needed for chest pain. 07/21/20  Yes Larey Dresser, MD  OXYGEN Inhale 3 L into the lungs continuous.   Yes [provider]  Pirfenidone (ESBRIET) 267 MG TABS Take 1 tab three times daily for 7 days, then 2 tabs three times daily for 7 days, then 3 tabs three times daily thereafter. Patient taking differently: Take 3 tablets by mouth daily. 03/02/21  Yes Brand Males, MD  predniSONE (DELTASONE) 10 MG tablet TAKE 1 TABLET (10 MG TOTAL) BY MOUTH DAILY WITH BREAKFAST. 02/23/21  Yes Larey Dresser, MD  sacubitril-valsartan (ENTRESTO) 24-26 MG TAKE 1 TABLET BY MOUTH 2 (TWO) TIMES DAILY. 04/06/20 04/30/21 Yes Larey Dresser, MD  spironolactone (ALDACTONE) 25 MG tablet Take 0.5 tablets (12.5 mg total) by mouth daily. 02/22/21 05/23/21 Yes [place], Maricela Bo, FNP  torsemide (DEMADEX) 20 MG tablet Take 40 mg by mouth daily. 03/16/21  Yes [provider]  fluticasone-salmeterol (ADVAIR HFA) 230-21 MCG/ACT inhaler Inhale 2 puffs into the lungs 2 (two) times daily. Patient not taking: Reported on 04/06/2021 11/26/20   Martyn Ehrich, NP  torsemide 40 MG TABS Take 40 mg by mouth daily. Patient not taking: Reported on 04/12/2021 03/16/21   Rafael Bihari, FNP     Critical care time: [place], D.O.  Internal Medicine Resident, PGY-3 [facility]  Pager: [phone] 5:45 AM, 04/08/2021

## 2021-04-08 NOTE — Hospital Course (Signed)
MELD-Na score: 13 at 04/08/2021  4:13 AM MELD score: 13 at 04/08/2021  4:13 AM Calculated from: Serum Creatinine: 1.27 mg/dL at 04/08/2021  4:13 AM Serum Sodium: 137 mmol/L at 04/08/2021  4:13 AM Total Bilirubin: 0.9 mg/dL (Using min of 1 mg/dL) at 04/06/2021  1:30 AM INR(ratio): 1.4 at 04/06/2021  1:30 AM Age: 60 years

## 2021-04-08 NOTE — Progress Notes (Addendum)
Advanced Heart Failure Rounding Note  PCP-Cardiologist: None   Subjective:   10/6 Given 1uPRBC. Hgb 7.2>7.6    Currently on CRRT removing ~ 50 per hour. Urine output trending down . < 200 cc per hour.    On milrinone 0.25 mcg + norepi 2 mcg + vasopressin 0.03 units. CO-OX 62%.   No complaints. Denies SOB.    Objective:   Weight Range: 76.3 kg Body mass index is 24.84 kg/m.   Vital Signs:   Temp:  [97.6 F (36.4 C)-98.6 F (37 C)] 97.9 F (36.6 C) (10/07 0737) Pulse Rate:  [79-129] 94 (10/07 0900) Resp:  [8-30] 17 (10/07 0900) BP: (78-115)/(45-85) 87/45 (10/07 0800) SpO2:  [87 %-100 %] 100 % (10/07 0900) Arterial Line BP: (77-126)/(40-71) 90/46 (10/07 0900) Weight:  [76.3 kg] 76.3 kg (10/07 0500) Last BM Date: 04/06/2021  Weight change: Filed Weights   04/06/21 0315 04/07/21 0500 04/08/21 0500  Weight: 75.2 kg 79.9 kg 76.3 kg    Intake/Output:   Intake/Output Summary (Last 24 hours) at 04/08/2021 1009 Last data filed at 04/08/2021 0900 Gross per 24 hour  Intake 1054.56 ml  Output 2352 ml  Net -1297.44 ml      Physical Exam  CVP 18 On CRRT General:   No resp difficulty HEENT: normal Neck: supple. JVP to jaw.  Carotids 2+ bilat; no bruits. No lymphadenopathy or thryomegaly appreciated. Cor: PMI nondisplaced. Irregular rate & rhythm. No rubs, gallops or murmurs. Lungs: clear on 3 liters [place].  Abdomen: soft, nontender, nondistended. No hepatosplenomegaly. No bruits or masses. Good bowel sounds. Extremities: no cyanosis, clubbing, rash, edema. Lfemoral CVC.  Neuro: alert & orientedx3, cranial nerves grossly intact. moves all 4 extremities w/o difficulty. Affect pleasant  Telemetry    Aif b 90-100s with occasional PVCs.   EKG    No new this am for review  Labs    CBC Recent Labs    04/07/21 1537 04/08/21 0413  WBC 4.4 3.8*  HGB 8.0* 7.6*  HCT 25.2* 23.4*  MCV 113.5* 114.1*  PLT 70* 59*   Basic Metabolic Panel Recent Labs    04/07/21 0313  04/07/21 1537 04/08/21 0413  NA 135 136 137  K 4.6 4.6 4.2  CL 101 104 102  CO2 _0 GLUCOSE 167* 234* 161*  BUN 45* 27* 21*  CREATININE 2.59* 1.70* 1.27*  CALCIUM 7.7* 7.9* 8.0*  MG 2.2  --  2.4  PHOS 3.2 2.4* 1.4*   Liver Function Tests Recent Labs    04/06/21 0130 04/06/21 0500 04/07/21 1537 04/08/21 0413  AST 33  --   --   --   ALT 22  --   --   --   ALKPHOS 61  --   --   --   BILITOT 0.9  --   --   --   PROT 5.4*  --   --   --   ALBUMIN 2.7*   < > 2.9* 2.9*   < > = values in this interval not displayed.   No results for input(s): LIPASE, AMYLASE in the last 72 hours. Cardiac Enzymes No results for input(s): CKTOTAL, CKMB, CKMBINDEX, TROPONINI in the last 72 hours.  BNP: BNP (last 3 results) Recent Labs    12/14/20 1255 02/15/21 1244 03/16/21 1422  BNP 768.9* 923.6* 1,237.2*    ProBNP (last 3 results) No results for input(s): PROBNP in the last 8760 hours.   D-Dimer No results for input(s): DDIMER in the last  72 hours. Hemoglobin A1C No results for input(s): HGBA1C in the last 72 hours.  Fasting Lipid Panel No results for input(s): CHOL, HDL, LDLCALC, TRIG, CHOLHDL, LDLDIRECT in the last 72 hours. Thyroid Function Tests No results for input(s): TSH, T4TOTAL, T3FREE, THYROIDAB in the last 72 hours.  Invalid input(s): FREET3  Other results:   Imaging    No results found.   Medications:     Scheduled Medications:  sodium chloride   Intravenous Once   alteplase  2 mg Intracatheter Once   alteplase  2 mg Intracatheter Once   aspirin EC  81 mg Oral Daily   atorvastatin  40 mg Oral Daily   B-complex with vitamin C  1 tablet Oral Daily   Chlorhexidine Gluconate Cloth  6 each Topical Daily   feeding supplement  237 mL Oral TID BM   hydrocortisone sod succinate (SOLU-CORTEF) inj  100 mg Intravenous Q8H   influenza vac split quadrivalent PF  0.5 mL Intramuscular Tomorrow-1000   insulin aspart  0-9 Units Subcutaneous Q4H   mouth rinse   15 mL Mouth Rinse BID   midodrine  10 mg Oral TID with meals   mometasone-formoterol  2 puff Inhalation BID   [START ON 04/09/2021] pantoprazole (PROTONIX) IV  40 mg Intravenous Daily   sodium chloride flush  10-40 mL Intracatheter Q12H    Infusions:   prismasol BGK 4/2.5 400 mL/hr at 04/08/21 0108    prismasol BGK 4/2.5 300 mL/hr at 04/07/21 2004   sodium chloride     milrinone 0.25 mcg/kg/min (04/08/21 0900)   prismasol BGK 4/2.5 1,500 mL/hr at 04/08/21 3329   sodium phosphate  Dextrose 5% IVPB 44 mL/hr at 04/08/21 0900   vasopressin 0.03 Units/min (04/08/21 0900)    PRN Medications: albuterol, docusate sodium, fentaNYL (SUBLIMAZE) injection, heparin, lip balm, polyethylene glycol, sodium chloride, sodium chloride flush    Patient Profile   Vincent Chandler is a 60 yo male with history of chronic systolic HF, CAD s/p CABG AFL s/p ablation and atrial fib, hx VT, ILD d/t amio toxicity and stage III CKD. Admitted yesterday with syncope and a/c biventricular failure/cardiogenic shock.  Assessment/Plan    1. Acute on chronic systolic CHF: Ischemic cardiomyopathy. Medtronic ICD single chamber. 2/19 admission for decompensated HF requiring milrinone. Echo 2/7/019 EF 15% Mildly dilated RV. CPX in 2/19 was suggestive of severe limitation due to HF.   Has IVCD, not LBBB-like => saw Dr. Caryl Comes, will not upgrade ICD to CRT. Echo 3/21 with EF 20-25%.  Echo this admission with EF < 20%, moderate RV dysfunction.  Admitted with hypotension, AKI and concern for cardiogenic shock.   - .  He has been started on CVVH pulling around 50 cc/hr net negative UF.  - Continue CVVH, aim for net negative UF 50-100 cc/hr today.  - CO-OX 62%. Continue milrinone 0.25.  NE lower at 2 mcg+ vasopressin 0.03 units.   - Continue midodrine 10 mg tid.  - With significant ILD and now renal failure, not an LVAD or transplant candidate.   2. VT: 01/13/21. Asymptomatic. 1 ATP -> 1 shock that was successful. Device interrogated  this admission, no VT.  3. Afib/ Atrial flutter: Admitted in 2/19 with severe HF decompensation in setting of atrial flutter with RVR.  He was cardioverted.  He then had atrial flutter ablation in 3/19. He is off amiodarone due to question of amiodarone lung toxicity. No palpitations.  He is back in atrial fibrillation with mild RVR this admission.  -remains  in A fib.  - Not candidate for amiodarone with concern for prior lung toxicity.    - Anticoagulation on hold for now with drop in hgb and +FOBT.  - Consider eventual DCCV but not yet.  4. CAD: S/p CABG.  Last intervention was PCI to LCx in 2016. Repeat cath (1/22) with occluded SVG-OM and SVG-RCA with patent LIMA-LAD and SVG-D, occluded proximal RCA, and newly occluded mid LCX. No targets for revascularization.  No chest pain but HS-TnI elevated to 3988 this admission.  Not felt to be true ACS, suspect demand ischemia with profound shock.  - No chest pain.  - Continue atorvastatin.  - ASA 81 for now.  5. Pulmonary fibrosis: PFTs in 2/19 were restrictive, and high resolution CT showed interstitial lung disease. Amiodarone was stopped due to concern for possible toxicity.  He has seen [name] => bronchoscopy suggestive of ILD related to amiodarone.  He has been on chronic prednisone.  High resolution CT chest repeated in 6/20, again showing ILD, described at UIP pattern. He has started Ofev. I think that a significant amount of his symptomatology is due to pulmonary fibrosis. Baseline on home oxygen.  - Stress dose steroids (hydrocortisone).  6. OSA: Uses CPAP 7. AKI on CKD stage 3: Possible ATN due to hypotension (sound like BP may have been low for a couple of weeks prior to admission based on symptoms). Still making some urine (175 cc yesterday).  - CVVH started, aim for 50-100 cc/hr net UF. 8. Pulmonary hypertension: Suspect group 3 PH due to lung disease (ILD). RHC (1/22) with moderate pulmonary arterial hypertension, PVR 4.2 WU. - He  did not tolerate Tyvaso for PH-ILD. 9. DM2: He did not tolerate [place].  10. Thrombocytopenia: Suspect related to critical illness,trending down 59>70  K today.  He is not currently anticoagulated.  11. ID: Afebrile, he is on empiric ceftriaxone but no definite sign of infection.  12. GI bleeding: FOBT+ with hgb falling - Anticoagulation on hold for now.  - PPI.  - Received 1UPRBC 10/6 hemoglobin 7.2>7.6  13. RUE swelling: [place] negative for DVT.  14. Thrombocytopenia: Suspect due to acute inflammatory illness.   Length of Stay: 4  Amy Clegg, NP  04/08/2021, 10:09 AM  Advanced Heart Failure Team Pager [phone] (M-F; 7a - 5p)  Please contact [facility] for night-coverage after hours (5p -7a ) and weekends on [email or web address]   Patient see with NP, agree with the above note.  CVP 16 on my read, Co-ox 61%.  He is currently on milrinone 0.25, vasopressin 0.03, and NE 2.  CVVH currently pulling 50 cc/hr net.  Hgb up to 7.6 after 1 unit PRBCs yesterday.   He denies dyspnea, feels good.   General: NAD Neck: JVP 14-16 cm, no thyromegaly or thyroid nodule.  Lungs: Dry crackles at bases.  CV: Nondisplaced PMI.  Heart regular S1/S2, no S3/S4, no murmur.  1+ ankle edema.    Abdomen: Soft, nontender, no hepatosplenomegaly, no distention.  Skin: Intact without lesions or rashes.  Neurologic: Alert and oriented x 3.  Psych: Normal affect. Extremities: No clubbing or cyanosis.  HEENT: Normal.   Patient is stable but still on significant support.  I had a lengthy discussion today with his sister and Dr. Lake Bells.  We will continue CVVH today, trying to get volume status more optimized (aim for CVP around 10).  To that end, will increase net UF to 100 cc/hr (avoid overly aggressive pull, we are hoping that his renal function  will recover).  Continue milrinone, wean NE and vasopressin as able.  With advanced pulmonary fibrosis, advanced CHF, and now AKI, I am afraid that our options are limited.  Once  volume is optimized, will try stopping CVVH and waiting for renal recovery.  If he has renal recovery, will need to gradually wean off pressor/inotrope support (not LVAD or transplant candidate).  If no renal recovery, I think he would be a poor candidate for long-term HD and we will need to consider palliative care.  I discussed hospice care with his sister but not yet with the patient.   CRITICAL CARE Performed by: Loralie Champagne  Total critical care time: 35 minutes  Critical care time was exclusive of separately billable procedures and treating other patients.  Critical care was necessary to treat or prevent imminent or life-threatening deterioration.  Critical care was time spent personally by me on the following activities: development of treatment plan with patient and/or surrogate as well as nursing, discussions with consultants, evaluation of patient's response to treatment, examination of patient, obtaining history from patient or surrogate, ordering and performing treatments and interventions, ordering and review of laboratory studies, ordering and review of radiographic studies, pulse oximetry and re-evaluation of patient's condition.   Loralie Champagne 04/08/2021 2:13 PM

## 2021-04-08 NOTE — Progress Notes (Signed)
[facility] ADULT ICU REPLACEMENT PROTOCOL   The patient does apply for the [facility] Adult ICU Electrolyte Replacment Protocol based on the criteria listed below:   1.Exclusion criteria: TCTS patients, ECMO patients and Hypothermia Protocol, and   Dialysis patients 2. Is GFR >/= 30 ml/min? Yes.    Patient's GFR today is >60 3. Is SCr </= 2? Yes.   Patient's SCr is 1.27 mg/dL 4. Did SCr increase >/= 0.5 in 24 hours? No. 5.Pt's weight >40kg  Yes.   6. Abnormal electrolyte(s): phos 1.4  7. Electrolytes replaced per protocol 8.  Call MD STAT for K+ </= 2.5, Phos </= 1, or Mag </= 1 Physician:  n/a  Vincent Chandler 04/08/2021 5:09 AM

## 2021-04-08 NOTE — Progress Notes (Signed)
Inpatient Diabetes Program Recommendations  AACE/ADA: New Consensus Statement on Inpatient Glycemic Control (2015)  Target Ranges:  Prepandial:   less than 140 mg/dL      Peak postprandial:   less than 180 mg/dL (1-2 hours)      Critically ill patients:  140 - 180 mg/dL   Lab Results  Component Value Date   GLUCAP 129 (H) 04/08/2021   HGBA1C 5.5 04/05/2021    Review of Glycemic Control Results for Vincent Chandler, Vincent Chandler (MRN [id number]) as of 04/08/2021 11:15  Ref. Range 04/07/2021 07:27 04/07/2021 11:34 04/07/2021 15:25 04/07/2021 19:17 04/07/2021 23:15 04/08/2021 04:18 04/08/2021 07:36  Glucose-Capillary Latest Ref Range: 70 - 99 mg/dL 145 (H) 216 (H) 229 (H) 279 (H) 225 (H) 162 (H) 129 (H)   Inpatient Diabetes Program Recommendations:   Noted postprandial CBGs elevated >180. Consider: -Change diet to carb modified if appropriate. -Add Novolog 2 units tid meal coverage if eats 50% (while on steroids)  Thank you, [name] E. Aneudy Champlain, RN, MSN, CDE  Diabetes Coordinator Inpatient Glycemic Control Team Team Pager [phone] (8am-5pm) 04/08/2021 11:16 AM

## 2021-04-08 NOTE — Progress Notes (Signed)
[facility] Progress Note   Assessment/Plan **AKI on CKD:  Baseline Cr 1.5-2 CKD 3 now with severe AKI in setting of shock.  Suspect this is hemodynamically mediated AKI c/w cardiorenal.  Due to worsening azotemia proceeded 10/4 to CRRT though really didn't start CRRT until 10/5 due to catheter dysfunction.  The hope was that with improvements in hemodynamics his AKI would be improve but unfortunately he's now developing oliguria.  Long dialysis is certainly going to be a challenge unless his clinical status can improve significantly - discussed with him today.  Tentatively planning on holding CRRT today if ok with heart failure team.  This will allow clinical assessment of renal function and assessment of hemodynamics off CRRT.  **Syncope/shock:  vol depletion + cardiogenic in origin.  ICD interrogation wth no VT.  Midodrine started in attempt to help liberate from IV support.   **HFrEF:  has chronic biV failure - component of cardiogenic shock and now on milrinone with improved hemodynamics. Heart failure consulting.  Vol status has been a bit of a challenge - he's net neg 1.3L for admission and doesn't look grossly overloaded at this time. Per notes not an LVAD or transplant candidate.   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   **Anemia:  s/p transfusion 10/6  **Thrombocytopenia: no heparin with CRRT o/w per primary  Will follow, call with concerns.    Jannifer Hick MD 04/05/2021, 1:32 PM  [facility] Pager: [phone] ------------------------------------------------------------------------------------------------- Subjective:   Feeling a bit better this AM - nonspecifically feels improved. Remains  milrinone 0.25, vaso 0.03; midodrine.  I/Os 1055/ 2225, UOP 175, net neg 1299m for admission per I/Os.   Objective Vitals:   04/08/21 0700 04/08/21 0737 04/08/21 0800 04/08/21 0900  BP:   (!) 87/45    Pulse: (!) 129  100 94  Resp: (!) _0 Temp:  97.9 F (36.6 C)    TempSrc:  Oral    SpO2: 91%  100% 100%  Weight:      Height:       Physical Exam General: chronically ill appearing lying at 45 degrees in no distress Heart: RRR, no rub Lungs: clear ant  Abdomen: soft, nontender Extremities: no rash, toes appear less mottled. 1+ pitting diffuse edema Dialysis Access: LIJ temp HD cath.   Additional Objective Labs: Basic Metabolic Panel: Recent Labs  Lab 04/07/21 0313 04/07/21 1537 04/08/21 0413  NA 135 136 137  K 4.6 4.6 4.2  CL 101 104 102  CO2 _1 GLUCOSE 167* 234* 161*  BUN 45* 27* 21*  CREATININE 2.59* 1.70* 1.27*  CALCIUM 7.7* 7.9* 8.0*  PHOS 3.2 2.4* 1.4*    Liver Function Tests: Recent Labs  Lab 05/02/2021 2022 04/05/21 0019 04/06/21 0130 04/06/21 0500 04/07/21 0313 04/07/21 1537 04/08/21 0413  AST 22  --  33  --   --   --   --   ALT 12  --  22  --   --   --   --   ALKPHOS 38  --  61  --   --   --   --   BILITOT 0.4  --  0.9  --   --   --   --   PROT 3.3*  --  5.4*  --   --   --   --   ALBUMIN 1.7*   < > 2.7*   < >  2.7* 2.9* 2.9*   < > = values in this interval not displayed.    No results for input(s): LIPASE, AMYLASE in the last 168 hours. CBC: Recent Labs  Lab 04/10/2021 1918 05/02/2021 1929 04/06/21 1417 04/06/21 2009 04/07/21 0313 04/07/21 1537 04/08/21 0413  WBC 6.5   < > 4.6 4.4 3.7* 4.4 3.8*  NEUTROABS 4.9  --   --   --   --   --   --   HGB 11.5*   < > 8.1* 7.5* 7.2* 8.0* 7.6*  HCT 36.6*   < > 25.2* 23.0* 21.7* 25.2* 23.4*  MCV 128.0*   < > 120.6* 118.6* 120.6* 113.5* 114.1*  PLT PLATELET CLUMPS NOTED ON SMEAR, UNABLE TO ESTIMATE   < > 88* 81* 70* 70* 59*   < > = values in this interval not displayed.    Blood Culture    Component Value Date/Time   SDES BLOOD LEFT ANTECUBITAL 04/25/2021 1946   SDES BLOOD RIGHT FOREARM 04/27/2021 1946   SPECREQUEST  04/23/2021 1946    BOTTLES DRAWN AEROBIC AND ANAEROBIC Blood Culture  results may not be optimal due to an inadequate volume of blood received in culture bottles   SPECREQUEST  04/08/2021 1946    BOTTLES DRAWN AEROBIC AND ANAEROBIC Blood Culture results may not be optimal due to an inadequate volume of blood received in culture bottles   CULT  04/14/2021 1946    NO GROWTH 3 DAYS Performed at [facility], [place] [address] [place], [place] [address]    CULT  04/29/2021 1946    NO GROWTH 3 DAYS Performed at [facility] [address] [place], [place] [address]    REPTSTATUS PENDING 04/02/2021 1946   REPTSTATUS PENDING 04/05/2021 1946    Cardiac Enzymes: No results for input(s): CKTOTAL, CKMB, CKMBINDEX, TROPONINI in the last 168 hours. CBG: Recent Labs  Lab 04/07/21 1525 04/07/21 1917 04/07/21 2315 04/08/21 0418 04/08/21 0736  GLUCAP 229* 279* 225* 162* 129*    Iron Studies:  Recent Labs    04/06/21 0130  FERRITIN 355*    _0 @ Studies/Results: DG CHEST PORT 1 VIEW  Result Date: 04/06/2021 CLINICAL DATA:  Hemodialysis catheter replacement EXAM: PORTABLE CHEST 1 VIEW COMPARISON:  Portable exam 1100 hours compared to 04/05/2021 FINDINGS: New LEFT subclavian dual lumen central venous catheter with tip projecting over SVC near cavoatrial junction. LEFT subclavian ICD with lead projecting over RIGHT ventricle. Stimulator via RIGHT chest with lead in the RIGHT cervical region. Enlargement of cardiac silhouette post CABG. Persistent patchy infiltrates bilaterally, question edema versus infection. No pleural effusion or pneumothorax. IMPRESSION: No pneumothorax following LEFT jugular line placement. Enlargement of cardiac silhouette post CABG and ICD with persistent BILATERAL pulmonary infiltrates which could represent pulmonary edema or infection, little changed. Electronically Signed   By: Lavonia Dana M.D.   On: 04/06/2021 11:53   ECHOCARDIOGRAM COMPLETE  Result Date: 04/06/2021    ECHOCARDIOGRAM REPORT   Patient Name:   Vincent Chandler  [facility] Date of Exam: 04/06/2021 Medical Rec #:  [id number]      Height:       69.0 in Accession #:    [id number]     Weight:       165.8 lb Date of Birth:  June 21, 1961      BSA:          1.908 m Patient Age:    60 years       BP:  108/62 mmHg Patient Gender: M              HR:           118 bpm. Exam Location:  Inpatient Procedure: 2D Echo, Cardiac Doppler, Color Doppler and Intracardiac            Opacification Agent Indications:    CHF  History:        Patient has prior history of Echocardiogram examinations, most                 recent 09/19/2019. Cardiomyopathy and CHF, CAD, Arrythmias:Atrial                 Fibrillation; Risk Factors:Diabetes and Dyslipidemia.  Sonographer:    Maudry Mayhew MHA, RDMS, RVT, RDCS Referring Phys: [id number] SIMMONS  Sonographer Comments: Technically difficult study due to poor echo windows and no subcostal window. Patient on CRRT during echocardiogram. IMPRESSIONS  1. No apical thrombus with Definity contrast. Left ventricular ejection fraction, by estimation, is <20%. The left ventricle has severely decreased function. The left ventricle demonstrates global hypokinesis. Left ventricular diastolic function could not be evaluated.  2. Right ventricular systolic function is moderately reduced. The right ventricular size is mildly enlarged.  3. The mitral valve is abnormal. Trivial mitral valve regurgitation.  4. The aortic valve was not well visualized. Aortic valve regurgitation is not visualized. Mild aortic valve sclerosis is present, with no evidence of aortic valve stenosis. Comparison(s): Changes from prior study are noted. 09/19/2019: LVEF 20-25%. FINDINGS  Left Ventricle: No apical thrombus with Definity contrast. Left ventricular ejection fraction, by estimation, is <20%. The left ventricle has severely decreased function. The left ventricle demonstrates global hypokinesis. Definity contrast agent was given IV to delineate the left ventricular endocardial  borders. The left ventricular internal cavity size was normal in size. There is no left ventricular hypertrophy. Left ventricular diastolic function could not be evaluated due to atrial fibrillation.  Left ventricular diastolic function could not be evaluated. Right Ventricle: The right ventricular size is mildly enlarged. No increase in right ventricular wall thickness. Right ventricular systolic function is moderately reduced. Left Atrium: Left atrial size was normal in size. Right Atrium: Right atrial size was normal in size. Pericardium: There is no evidence of pericardial effusion. Mitral Valve: The mitral valve is abnormal. There is mild thickening of the mitral valve leaflet(s). Trivial mitral valve regurgitation. Tricuspid Valve: The tricuspid valve is grossly normal. Tricuspid valve regurgitation is mild. Aortic Valve: The aortic valve was not well visualized. Aortic valve regurgitation is not visualized. Mild aortic valve sclerosis is present, with no evidence of aortic valve stenosis. Aortic valve mean gradient measures 2.0 mmHg. Aortic valve peak gradient measures 4.2 mmHg. Aortic valve area, by VTI measures 2.78 cm. Pulmonic Valve: The pulmonic valve was not well visualized. Pulmonic valve regurgitation is not visualized. Aorta: The aortic root and ascending aorta are structurally normal, with no evidence of dilitation. IAS/Shunts: No atrial level shunt detected by color flow Doppler. Additional Comments: A device lead is visualized.  LEFT VENTRICLE PLAX 2D LVIDd:         5.30 cm     Diastology LVIDs:         5.00 cm     LV e' medial:    5.78 cm/s LV PW:         0.60 cm     LV E/e' medial:  21.5 LV IVS:        0.60 cm  LV e' lateral:   20.00 cm/s LVOT diam:     2.10 cm     LV E/e' lateral: 6.2 LV SV:         38 LV SV Index:   20 LVOT Area:     3.46 cm  LV Volumes (MOD) LV vol d, MOD A4C: 80.1 ml LV vol s, MOD A4C: 68.3 ml LV SV MOD A4C:     80.1 ml RIGHT VENTRICLE RV S prime:     6.08 cm/s TAPSE  (M-mode): 0.7 cm LEFT ATRIUM             Index LA diam:        3.00 cm 1.57 cm/m LA Vol (A2C):   58.2 ml 30.51 ml/m LA Vol (A4C):   40.1 ml 21.02 ml/m LA Biplane Vol: 52.1 ml 27.31 ml/m  AORTIC VALVE AV Area (Vmax):    2.91 cm AV Area (Vmean):   2.97 cm AV Area (VTI):     2.78 cm AV Vmax:           102.00 cm/s AV Vmean:          63.500 cm/s AV VTI:            0.136 m AV Peak Grad:      4.2 mmHg AV Mean Grad:      2.0 mmHg LVOT Vmax:         85.80 cm/s LVOT Vmean:        54.400 cm/s LVOT VTI:          0.109 m LVOT/AV VTI ratio: 0.80  AORTA Ao Root diam: 2.90 cm MITRAL VALVE                TRICUSPID VALVE MV Area (PHT): 5.20 cm     TR Peak grad:   22.3 mmHg MV Decel Time: 146 msec     TR Vmax:        236.00 cm/s MV E velocity: 124.00 cm/s                             SHUNTS                             Systemic VTI:  0.11 m                             Systemic Diam: 2.10 cm Lyman Bishop MD Electronically signed by Lyman Bishop MD Signature Date/Time: 04/06/2021/5:02:29 PM    Final    VAS [place] UPPER EXTREMITY VENOUS DUPLEX  Result Date: 04/06/2021 UPPER VENOUS STUDY  Patient Name:  Vincent Chandler [facility]  Date of Exam:   04/06/2021 Medical Rec #: [id number]       Accession #:    [id number] Date of Birth: 14-Feb-1961       Patient Gender: M Patient Age:   84 years Exam Location:  [facility] Procedure:      VAS [place] UPPER EXTREMITY VENOUS DUPLEX Referring Phys: Ria Comment [facility] --------------------------------------------------------------------------------  Indications: Swelling Risk Factors: None identified. Limitations: Poor ultrasound/tissue interface, bandages, line, open wound and Dialysis. Comparison Study: No prior studies. Performing Technologist: Oliver Hum RVT  Examination Guidelines: A complete evaluation includes B-mode imaging, spectral Doppler, color Doppler, and power Doppler as needed of all accessible portions of each vessel. Bilateral testing is considered an integral part of  a complete  examination. Limited examinations for reoccurring indications may be performed as noted.  Right Findings: +----------+------------+---------+-----------+----------+-------+ RIGHT     CompressiblePhasicitySpontaneousPropertiesSummary +----------+------------+---------+-----------+----------+-------+ Subclavian    Full       Yes       Yes                      +----------+------------+---------+-----------+----------+-------+  Left Findings: +----------+------------+---------+-----------+----------+-------+ LEFT      CompressiblePhasicitySpontaneousPropertiesSummary +----------+------------+---------+-----------+----------+-------+ Subclavian    Full       Yes       Yes                      +----------+------------+---------+-----------+----------+-------+ Axillary      Full       Yes       Yes                      +----------+------------+---------+-----------+----------+-------+ Brachial      Full       Yes       Yes                      +----------+------------+---------+-----------+----------+-------+ Radial        Full                                          +----------+------------+---------+-----------+----------+-------+ Ulnar         Full                                          +----------+------------+---------+-----------+----------+-------+ Cephalic      Full                                          +----------+------------+---------+-----------+----------+-------+ Basilic       Full                                          +----------+------------+---------+-----------+----------+-------+  Summary:  Right: No evidence of thrombosis in the subclavian.  Left: No evidence of deep vein thrombosis in the upper extremity. No evidence of superficial vein thrombosis in the upper extremity.  *See table(s) above for measurements and observations.  Diagnosing physician: Harold Barban MD Electronically signed by Harold Barban MD on 04/06/2021 at  4:52:35 PM.    Final    Medications:   prismasol BGK 4/2.5 400 mL/hr at 04/08/21 0108    prismasol BGK 4/2.5 300 mL/hr at 04/07/21 2004   sodium chloride     cefTRIAXone (ROCEPHIN)  IV Stopped (04/07/21 1848)   milrinone 0.25 mcg/kg/min (04/08/21 0900)   prismasol BGK 4/2.5 1,500 mL/hr at 04/08/21 0903   sodium phosphate  Dextrose 5% IVPB 44 mL/hr at 04/08/21 0900   vasopressin 0.03 Units/min (04/08/21 0900)    sodium chloride   Intravenous Once   alteplase  2 mg Intracatheter Once   alteplase  2 mg Intracatheter Once   aspirin EC  81 mg Oral Daily   atorvastatin  40 mg Oral Daily   B-complex with vitamin C  1 tablet Oral Daily  Chlorhexidine Gluconate Cloth  6 each Topical Daily   feeding supplement  237 mL Oral TID BM   heparin injection (subcutaneous)  5,000 Units Subcutaneous Q8H   hydrocortisone sod succinate (SOLU-CORTEF) inj  100 mg Intravenous Q8H   influenza vac split quadrivalent PF  0.5 mL Intramuscular Tomorrow-1000   insulin aspart  0-9 Units Subcutaneous Q4H   mouth rinse  15 mL Mouth Rinse BID   midodrine  5 mg Oral TID WC   mometasone-formoterol  2 puff Inhalation BID   pantoprazole (PROTONIX) IV  40 mg Intravenous Q12H   sodium chloride flush  10-40 mL Intracatheter Q12H

## 2021-04-09 DIAGNOSIS — N179 Acute kidney failure, unspecified: Secondary | ICD-10-CM | POA: Diagnosis not present

## 2021-04-09 DIAGNOSIS — K922 Gastrointestinal hemorrhage, unspecified: Secondary | ICD-10-CM | POA: Diagnosis not present

## 2021-04-09 DIAGNOSIS — R570 Cardiogenic shock: Secondary | ICD-10-CM | POA: Diagnosis not present

## 2021-04-09 LAB — SODIUM, URINE, RANDOM: Sodium, Ur: 18 mmol/L

## 2021-04-09 LAB — URINALYSIS, ROUTINE W REFLEX MICROSCOPIC
Bilirubin Urine: NEGATIVE
Glucose, UA: 50 mg/dL — AB
Ketones, ur: 5 mg/dL — AB
Leukocytes,Ua: NEGATIVE
Nitrite: NEGATIVE
Protein, ur: 100 mg/dL — AB
Specific Gravity, Urine: 1.02 (ref 1.005–1.030)
pH: 5 (ref 5.0–8.0)

## 2021-04-09 LAB — RENAL FUNCTION PANEL
Albumin: 3.3 g/dL — ABNORMAL LOW (ref 3.5–5.0)
Albumin: 3.2 g/dL — ABNORMAL LOW (ref 3.5–5.0)
Anion gap: 8 (ref 5–15)
Anion gap: 7 (ref 5–15)
BUN: 17 mg/dL (ref 6–20)
BUN: 14 mg/dL (ref 6–20)
CO2: 27 mmol/L (ref 22–32)
CO2: 25 mmol/L (ref 22–32)
Calcium: 8.2 mg/dL — ABNORMAL LOW (ref 8.9–10.3)
Calcium: 8 mg/dL — ABNORMAL LOW (ref 8.9–10.3)
Chloride: 101 mmol/L (ref 98–111)
Chloride: 103 mmol/L (ref 98–111)
Creatinine, Ser: 1.33 mg/dL — ABNORMAL HIGH (ref 0.61–1.24)
Creatinine, Ser: 1.25 mg/dL — ABNORMAL HIGH (ref 0.61–1.24)
GFR, Estimated: >60 mL/min (ref >60)
GFR, Estimated: >60 mL/min (ref >60)
Glucose, Bld: 167 mg/dL — ABNORMAL HIGH (ref 70–99)
Glucose, Bld: 254 mg/dL — ABNORMAL HIGH (ref 70–99)
Phosphorus: 2.1 mg/dL — ABNORMAL LOW (ref 2.5–4.6)
Phosphorus: 2.2 mg/dL — ABNORMAL LOW (ref 2.5–4.6)
Potassium: 4 mmol/L (ref 3.5–5.1)
Potassium: 4.1 mmol/L (ref 3.5–5.1)
Sodium: 136 mmol/L (ref 135–145)
Sodium: 135 mmol/L (ref 135–145)

## 2021-04-09 LAB — CBC
HCT: 25.1 % — ABNORMAL LOW (ref 39.0–52.0)
Hemoglobin: 7.7 g/dL — ABNORMAL LOW (ref 13.0–17.0)
MCH: 36.2 pg — ABNORMAL HIGH (ref 26.0–34.0)
MCHC: 30.7 g/dL (ref 30.0–36.0)
MCV: 117.8 fL — ABNORMAL HIGH (ref 80.0–100.0)
Platelets: 68 10*3/uL — ABNORMAL LOW (ref 150–400)
RBC: 2.13 MIL/uL — ABNORMAL LOW (ref 4.22–5.81)
RDW: 23.2 % — ABNORMAL HIGH (ref 11.5–15.5)
WBC: 5.1 10*3/uL (ref 4.0–10.5)
nRBC: 2.1 % — ABNORMAL HIGH (ref 0.0–0.2)

## 2021-04-09 LAB — COOXEMETRY PANEL
Carboxyhemoglobin: 1.2 % (ref 0.5–1.5)
Methemoglobin: 0.9 % (ref 0.0–1.5)
O2 Saturation: 64 %
Total hemoglobin: 7.9 g/dL — ABNORMAL LOW (ref 12.0–16.0)

## 2021-04-09 LAB — CULTURE, BLOOD (ROUTINE X 2)
Culture: NO GROWTH
Culture: NO GROWTH

## 2021-04-09 LAB — GLUCOSE, CAPILLARY
Glucose-Capillary: 166 mg/dL — ABNORMAL HIGH (ref 70–99)
Glucose-Capillary: 158 mg/dL — ABNORMAL HIGH (ref 70–99)
Glucose-Capillary: 202 mg/dL — ABNORMAL HIGH (ref 70–99)
Glucose-Capillary: 279 mg/dL — ABNORMAL HIGH (ref 70–99)
Glucose-Capillary: 186 mg/dL — ABNORMAL HIGH (ref 70–99)
Glucose-Capillary: 155 mg/dL — ABNORMAL HIGH (ref 70–99)

## 2021-04-09 LAB — TSH: TSH: 1.256 u[IU]/mL (ref 0.350–4.500)

## 2021-04-09 LAB — MAGNESIUM: Magnesium: 2.5 mg/dL — ABNORMAL HIGH (ref 1.7–2.4)

## 2021-04-09 LAB — CREATININE, URINE, RANDOM: Creatinine, Urine: 185.83 mg/dL

## 2021-04-09 MED ORDER — NOREPINEPHRINE 16 MG/250ML-% IV SOLN
0.0000 ug/min | INTRAVENOUS | Status: DC
  Administered 2021-04-10: 2 ug/min via INTRAVENOUS
  Filled 2021-04-09 (×2): qty 250

## 2021-04-09 MED ORDER — VITAMIN B-12 1000 MCG PO TABS
1000.0000 ug | ORAL_TABLET | Freq: Every day | ORAL | Status: DC
  Administered 2021-04-09 – 2021-04-10 (×2): 1000 ug via ORAL
  Filled 2021-04-09 (×2): qty 1

## 2021-04-09 MED ORDER — K PHOS MONO-SOD PHOS DI & MONO 155-852-130 MG PO TABS: 500.0000 mg | ORAL_TABLET | Freq: Two times a day (BID) | ORAL | Status: DC

## 2021-04-09 MED ORDER — SODIUM CHLORIDE 0.9 % IV SOLN: 0.0000 ug/min | INTRAVENOUS | Status: DC

## 2021-04-09 MED ORDER — SODIUM PHOSPHATES 45 MMOLE/15ML IV SOLN
15.0000 mmol | Freq: Once | INTRAVENOUS | Status: AC
  Administered 2021-04-09: 15 mmol via INTRAVENOUS
  Filled 2021-04-09: qty 5

## 2021-04-09 MED ORDER — CHLORHEXIDINE GLUCONATE CLOTH 2 % EX PADS
6.0000 | MEDICATED_PAD | Freq: Every day | CUTANEOUS | Status: DC
  Administered 2021-04-09: 6 via TOPICAL

## 2021-04-09 NOTE — Progress Notes (Signed)
[facility] ADULT ICU REPLACEMENT PROTOCOL   The patient does apply for the [facility] Adult ICU Electrolyte Replacment Protocol based on the criteria listed below:   1.Exclusion criteria: TCTS patients, ECMO patients and Hypothermia Protocol, and   Dialysis patients 2. Is GFR >/= 30 ml/min? Yes.    Patient's GFR today is >60 3. Is SCr </= 2? No. Patient's SCr is 1.33 mg/dL 4. Did SCr increase >/= 0.5 in 24 hours? No. 5.Pt's weight >40kg  Yes.   6. Abnormal electrolyte(s): phos 2.1  7. Electrolytes replaced per protocol 8.  Call MD STAT for K+ </= 2.5, Phos </= 1, or Mag </= 1 Physician:  n/a  Darlys Gales 04/09/2021 4:34 AM

## 2021-04-09 NOTE — Progress Notes (Signed)
[facility] Progress Note   Assessment/Plan **AKI on CKD:  Baseline Cr 1.5-2 CKD 3 now with severe AKI in setting of shock.  Suspect this is hemodynamically mediated AKI c/w cardiorenal.  Due to worsening azotemia proceeded 10/4 to CRRT.  The hope is that with improvements in hemodynamics his AKI would be improve but unfortunately he's developing oliguria.  Long dialysis is certainly going to be a challenge unless his clinical status can improve significantly - discussed with him yesterday and today.  Current plan is to reach euvolemia with CVP 10 then hold CRRT and see how he does clinically.  If he cannot come off of inotrope and remains dialysis dependent plan would be to recommend hospice.    **Syncope/shock:  vol depletion + cardiogenic in origin.  ICD interrogation wth no VT.  Midodrine started in attempt to help liberate from vasopressor/inotropic support on which he remains dependent.   **HFrEF:  has chronic biV failure - component of cardiogenic shock and now on milrinone with improved hemodynamics. Heart failure consulting.  Working towards euvolemia with CVP goal 10 using CRRT for UF currently.  Per notes not an LVAD or transplant candidate.   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   **Anemia:  s/p transfusion 10/6  **Thrombocytopenia: no heparin with CRRT o/w per primary  D/w primary team today. Will follow, call with concerns.    Jannifer Hick MD 04/05/2021, 1:32 PM  [facility] Pager: [phone] ------------------------------------------------------------------------------------------------- Subjective:   Feeling ok this AM; considering code status.  Sister to visit later.    Remains on NE low dose,  milrinone 0.25, vaso 0.03; midodrine.  I/Os 1055/ 2225, UOP 150, net neg 3326m for admission per I/Os.   Objective Vitals:   04/09/21 1000 04/09/21 1015 04/09/21 1030 04/09/21 1045  BP:       Pulse: 90 (!) 121 93 97  Resp: 14 (!) _0 Temp:      TempSrc:      SpO2: 100% 94% 98% 98%  Weight:      Height:       Physical Exam General: chronically ill appearing lying at 45 degrees in no distress Heart: RRR, no rub Lungs: clear ant  Abdomen: soft, nontender Extremitiesimproving trace to 1+ pitting diffuse edema Dialysis Access: LIJ temp HD cath.   Additional Objective Labs: Basic Metabolic Panel: Recent Labs  Lab 04/08/21 0413 04/08/21 1600 04/08/21 1935 04/09/21 0327  NA 137 135  --  136  K 4.2 4.2  --  4.0  CL 102 103  --  101  CO2 26 24  --  27  GLUCOSE 161* 229*  --  167*  BUN 21* 16  --  17  CREATININE 1.27* 1.66*  --  1.33*  CALCIUM 8.0* 7.9*  --  8.2*  PHOS 1.4* 3.1 2.7 2.1*    Liver Function Tests: Recent Labs  Lab 04/09/2021 2022 04/05/21 0019 04/06/21 0130 04/06/21 0500 04/08/21 0413 04/08/21 1600 04/09/21 0327  AST 22  --  33  --   --   --   --   ALT 12  --  22  --   --   --   --   ALKPHOS 38  --  61  --   --   --   --   BILITOT 0.4  --  0.9  --   --   --   --   PROT 3.3*  --  5.4*  --   --   --   --   ALBUMIN 1.7*   < > 2.7*   < > 2.9* 3.2* 3.3*   < > = values in this interval not displayed.    No results for input(s): LIPASE, AMYLASE in the last 168 hours. CBC: Recent Labs  Lab 04/19/2021 1918 04/12/2021 1929 04/06/21 2009 04/07/21 0313 04/07/21 1537 04/08/21 0413 04/09/21 0327  WBC 6.5   < > 4.4 3.7* 4.4 3.8* 5.1  NEUTROABS 4.9  --   --   --   --   --   --   HGB 11.5*   < > 7.5* 7.2* 8.0* 7.6* 7.7*  HCT 36.6*   < > 23.0* 21.7* 25.2* 23.4* 25.1*  MCV 128.0*   < > 118.6* 120.6* 113.5* 114.1* 117.8*  PLT PLATELET CLUMPS NOTED ON SMEAR, UNABLE TO ESTIMATE   < > 81* 70* 70* 59* 68*   < > = values in this interval not displayed.    Blood Culture    Component Value Date/Time   SDES BLOOD LEFT ANTECUBITAL 04/03/2021 1946   SDES BLOOD RIGHT FOREARM 04/07/2021 1946   SPECREQUEST  04/26/2021 1946    BOTTLES DRAWN AEROBIC AND  ANAEROBIC Blood Culture results may not be optimal due to an inadequate volume of blood received in culture bottles   SPECREQUEST  04/30/2021 1946    BOTTLES DRAWN AEROBIC AND ANAEROBIC Blood Culture results may not be optimal due to an inadequate volume of blood received in culture bottles   CULT  04/10/2021 1946    NO GROWTH 4 DAYS Performed at [facility], [place] [address], [place], [place] [address]    CULT  04/11/2021 1946    NO GROWTH 4 DAYS Performed at [facility] [address] [place], [place] [address]    REPTSTATUS PENDING 04/29/2021 1946   REPTSTATUS PENDING 04/09/2021 1946    Cardiac Enzymes: No results for input(s): CKTOTAL, CKMB, CKMBINDEX, TROPONINI in the last 168 hours. CBG: Recent Labs  Lab 04/08/21 1939 04/08/21 2345 04/09/21 0333 04/09/21 0724 04/09/21 1110  GLUCAP 228* 214* 166* 158* 202*    Iron Studies:  Recent Labs    04/08/21 1418  FERRITIN 319    _0 @ Studies/Results: No results found. Medications:   prismasol BGK 4/2.5 400 mL/hr at 04/09/21 0141    prismasol BGK 4/2.5 300 mL/hr at 04/09/21 0546   sodium chloride     milrinone 0.25 mcg/kg/min (04/09/21 1100)   norepinephrine (LEVOPHED) Adult infusion 1.8 mcg/min (04/09/21 1100)   prismasol BGK 4/2.5 1,500 mL/hr at 04/09/21 3474   sodium phosphate  Dextrose 5% IVPB 43 mL/hr at 04/09/21 1100   vasopressin 0.03 Units/min (04/09/21 1100)    aspirin EC  81 mg Oral Daily   atorvastatin  40 mg Oral Daily   B-complex with vitamin C  1 tablet Oral Daily   Chlorhexidine Gluconate Cloth  6 each Topical Daily   feeding supplement  237 mL Oral TID BM   hydrocortisone sod succinate (SOLU-CORTEF) inj  100 mg Intravenous Q8H   influenza vac split quadrivalent PF  0.5 mL Intramuscular Tomorrow-1000   insulin aspart  0-9 Units Subcutaneous Q4H   mouth rinse  15 mL Mouth Rinse BID   midodrine  10 mg Oral TID with meals   mometasone-formoterol  2 puff Inhalation BID    pantoprazole (PROTONIX) IV  40 mg Intravenous Daily   [START ON 04/10/2021] phosphorus  500 mg Oral BID  sodium chloride flush  10-40 mL Intracatheter Q12H   vitamin B-12  1,000 mcg Oral Daily

## 2021-04-09 NOTE — Progress Notes (Signed)
NAME:  Vincent Chandler, MRN:  [id number], DOB:  11-21-1960, LOS: 66 ADMISSION DATE:  04/26/2021, CONSULTATION DATE: 05/02/2021 REFERRING MD: EDP, CHIEF COMPLAINT: Syncope  Brief History   Vincent Chandler is a 60 year old male with past medical history significant for HFrEF, cardiomyopathy, AICD, atrial flutter on Eliquis, CAD, ILD on 3 L home O2 and OSA on CPAP, type 2 diabetes, GERD and hiatal hernia who presented to the ED after a syncopal episode while walking to the bathroom, sat down in his bed and then woke up on the floor.  He reports several weeks of lower extremity weakness and generally not feeling well with dizziness over the last several days along with dark stools.  He also reports nausea and constipation, denies any chest pain, palpitations, fevers, abdominal pain, diarrhea, new cough or URI symptoms.   On EMS arrival, patient was awake and alert though hypotensive.  He was given 500 cc IV fluid bolus.  In the ED, head and C-spine CTs were negative for acute findings, CXR without any infiltrate.  I-STAT was significant for potassium of 8.5, creatinine 5.7, hemoglobin 12.   He was given additional 500 cc LR and nephrology was consulted.  He has been urinating, reports decreased p.o. intake over several days.  He was given insulin, calcium, albuterol, and Lokelma and additional IV fluid ordered.  Lactic acid was 4.  Lab run BMP is pending.  PCCM consulted in the setting.  Past Medical History  HFrEF (EF of 25-30%) s/p AICD  CAD s/p CABG x4 Intermittent Afib  HLD OSA on CPAP ILD on chronic steroid  T2DM CKD Stage 3  Significant Hospital Events   10/03 - consulted for hyperkalemia and hypotension and admitted to [facility] 10/04 -advanced heart failure team consulted, central line placed left IJ and left more, right femoral A-line placed 10/04 - tried to initiate CRRT without success due to HD line problems.  10/05 - HD cath inserted in Rt IJ and resumed CRRT 10/06 - started midodrine and  weaned off levo 10/07 - increased midodrine and remains on levo, milrinone, and vaso  Consults:  Advanced heart failure team Nephrology   Procedures:  10/04 -HD/CVC left IJ and left femoral, arterial line placed right femoral 10/05 - Lft IJ HD cath removed and replacement a new HD cath.   Significant Diagnostic Tests:  Echocardiogram Result Date: 09/2019 1. Left ventricular ejection fraction, by estimation, is 20 to 25%. The left ventricle has severely decreased function. The left ventricle demonstrates global hypokinesis. The left ventricular internal cavity size was mildly dilated. There is mild left ventricular hypertrophy. Left ventricular diastolic parameters are consistent with Grade I diastolic dysfunction (impaired relaxation). 2. Right ventricular systolic function is moderately reduced. The right ventricular size is mildly enlarged. Tricuspid regurgitation signal is inadequate for assessing PA pressure. 3. The mitral valve is normal in structure. Trivial mitral valve regurgitation. No evidence of mitral stenosis. 4. The aortic valve is tricuspid. Aortic valve regurgitation is not visualized. No aortic stenosis is present. 5. The inferior vena cava is normal in size with <50% respiratory variability, suggesting right atrial pressure of 8 mmHg.  CT Chest High Resolution: Result Date: 08.12/2019 IMPRESSION: 1. Spectrum of findings compatible with basilar predominant fibrotic interstitial lung disease with probable early honeycombing. Findings have clearly progressed in the interval. Findings are consistent with UIP per consensus guidelines: Diagnosis of Idiopathic Pulmonary Fibrosis: An Official ATS/ERS/JRS/ALAT Clinical Practice Guideline. [place] 198, Iss 5, [id number], Mar 03 2017. 2. Stable mild cardiomegaly. 3. Aberrant nonaneurysmal right subclavian artery. 4. Aortic Atherosclerosis (ICD10-I70.0).  DG Chest 2 View Result Date:  04/18/2021 FINDINGS: Multiple sternal wires and vascular clips are seen. A single lead ventricular pacer is noted. A radiopaque stimulator and associated stimulator wire are seen overlying the lateral aspect of the mid right lung. Mild atelectasis is seen within the bilateral lung bases. There is no evidence of a pleural effusion or pneumothorax. The heart size and mediastinal contours are within normal limits. The visualized skeletal structures are unremarkable.  IMPRESSION: 1. Evidence of prior median sternotomy/CABG. 2. Mild bibasilar atelectasis.    CT Head Wo Contrast Result Date: 04/25/2021 FINDINGS: Brain: There is mild cerebral atrophy with widening of the extra-axial spaces and ventricular dilatation. There are areas of decreased attenuation within the white matter tracts of the supratentorial brain, consistent with microvascular disease changes. Vascular: No hyperdense vessel or unexpected calcification. Skull: Normal. Negative for fracture or focal lesion. Sinuses/Orbits: There is marked severity right maxillary sinus, bilateral ethmoid sinus, sphenoid sinus and frontal sinus mucosal thickening. Postoperative changes are noted along the medial walls of the bilateral maxillary sinuses. Other: None.  IMPRESSION: 1. No acute intracranial abnormality. 2. Generalized cerebral atrophy and microvascular disease changes of the supratentorial brain. 3. Marked severity paranasal sinus disease.    CT Cervical Spine Wo Contrast Result Date: 05/01/2021 FINDINGS: Alignment: Normal. Skull base and vertebrae: No acute fracture. No primary bone lesion or focal pathologic process. Soft tissues and spinal canal: No prevertebral fluid or swelling. No visible canal hematoma. A metallic density stimulator wire is noted within the neck soft tissues on the right. Disc levels: Mild multilevel endplate sclerosis is seen throughout the cervical spine. There is mild to moderate severity narrowing of the anterior atlantoaxial  articulation. Mild multilevel intervertebral disc space narrowing is seen. Moderate severity bilateral multilevel facet joint hypertrophy is noted. This is most prominent at the level of C2-C3. Upper chest: Negative. Other: None.  IMPRESSION: 1. No acute osseous abnormality of the cervical spine. 2. Mild multilevel degenerative changes, as described above. E  DG CHEST PORT 1 VIEW  Result Date: 04/05/2021 FINDINGS: Interval placement of left IJ dialysis catheter to the proximal aspect of the innominate vein. No pneumothorax. Stable left subclavian AICD. Slight increase in patchy interstitial and airspace opacities involving bases more than apices. Heart size upper limits normal. CABG markers. No definite effusion. Implanted stimulator device projects over the right hemithorax, leads directed cephalad. Sternotomy wires.  IMPRESSION: 1. Left IJ catheter placement to the proximal innominate vein. No pneumothorax. 2. Slight worsening of bilateral infiltrates or edema.     ICD Interrogated:  Result Date: 04/05/2021 -Short run of NSVT in September -Intermittent atrial fibrillation  Echocardiogram:  Result Date: 04/06/2021  1. No apical thrombus with Definity contrast. Left ventricular ejection fraction, by estimation, is <20%. The left ventricle has severely decreased function. The left ventricle demonstrates global hypokinesis. Left ventricular diastolic function could not be evaluated.   2. Right ventricular systolic function is moderately reduced. The right ventricular size is mildly enlarged.   3. The mitral valve is abnormal. Trivial mitral valve regurgitation.   4. The aortic valve was not well visualized. Aortic valve regurgitation is not visualized. Mild aortic valve sclerosis is present, with no evidence of aortic valve stenosis.   Vas [place] Upper Extremity: Result Date: 04/07/2021 Right: No evidence of thrombosis in the subclavian.  Left: No evidence of deep vein thrombosis in the upper  extremity. No evidence  of superficial vein thrombosis in the upper extremity.  Micro Data:  10/03 - COVD19/Influ negative  10/03 - MRSA PCR - negative  10/03 - BCx negative  Antimicrobials:  10/03 Ceftriaxone>10/07  Interim History/Subjective:  Overnight events: Held family discussion regarding patients clinical status with patient and his sister.   Patient in bed and resting. He denies any new complaints at this time.   Objective:  Blood pressure 108/74, pulse 88, temperature 97.6 F (36.4 C), temperature source Oral, resp. rate 15, height 5' 9" (1.753 m), weight 75.8 kg, SpO2 100 %. CVP:  [16 mmHg-19 mmHg] 16 mmHg      Intake/Output Summary (Last 24 hours) at 04/09/2021 0604 Last data filed at 04/09/2021 0600 Gross per 24 hour  Intake 1297.87 ml  Output 3166 ml  Net -1868.13 ml   Filed Weights   04/07/21 0500 04/08/21 0500 04/09/21 0342  Weight: 79.9 kg 76.3 kg 75.8 kg   NE @ 0.6 mcg/min Milrinone @ 0.25 mcg/kg/min Vaso @ 0.03 units/min  Examination: General: Alert and oriented HENT: PERRLA and normal extraocular eye motions, HD line in left IJ clean and dry Lungs: Clear to auscultation bilaterally Cardiovascular: Intermittently tachycardic, no murmurs rubs or gallops Abdomen: Nondistended nontender Extremities: Extremities feel warmer with improved distal pulses, 1-2+ LE edema to his  Neuro: No focal neurologic deficits GU: No Foley catheter in place, but endorses good urine output  Patient Lines/Drains/Airways Status     Active Line/Drains/Airways     Name Placement date Placement time Site Days   Arterial Line 04/05/21 Right Femoral 04/05/21  1000  Femoral  3   CVC Triple Lumen 04/05/21 Left Femoral 04/05/21  1530  -- 3   Hemodialysis Catheter Left Internal jugular Triple lumen Temporary (Non-Tunneled) 04/06/21  1025  Internal jugular  2   Incision (Closed) 07/24/18 Neck Right 07/24/18  0849  -- 989   Pressure Injury 04/05/21 Sacrum Deep Tissue Pressure Injury  - Purple or maroon localized area of discolored intact skin or blood-filled blister due to damage of underlying soft tissue from pressure and/or shear. 04/05/21  0015  -- 3   Wound / Incision (Open or Dehisced) 04/07/21 Skin tear Arm Lower;Posterior;Right 04/07/21  0700  Arm  1            Resolved Hospital Problem list     Assessment & Plan:  Hansel Devan is a 60 year old male with past medical history significant for HFrEF, cardiomyopathy, AICD, atrial flutter on Eliquis, CAD, ILD on 3 L home O2 and OSA on CPAP, type 2 diabetes, GERD and hiatal hernia who presented to the ED after a syncopal episode while walking to the bathroom and admitted for cardiogenic shock 2/2 to underlying cardiopulmonary disease and complicated cardiorenal syndrome on CRRT.   Cardiogenic shock 2/2 to cardiopulmonary disease Acute on chronic HFrEF (EF <20%) Ischemic cardiomyopathy Syncope: Patient continues to have good output from CRRT with roughly 3 L out for net output of 1.8 L in the last 24 hours.  Still minimal urine output with roughly 150 cc recorded.  Co-ox of 64 and CVP 16 on milrinone, vaso, and levo.  Milrinone titrated up to 10 mg 3 times daily yesterday.  I held a family meeting yesterday with him and his sister. We discussed his tenuous clinical picture and need for a more formal discussion regarding his goals of care as his long-term prognosis is likely poor.  They are in agreement with a palliative care consult. - Continue to monitor Co-ox and CVP -  Continue to hold BB/CBB -Continue milrinone and vasopressin, Keep MAPs > 65  -Midodrine 10 mg 3 times daily - Consider holding CRRT today and starting IV diuresis versus gentle fluid removal with CRRT - Keep K > 4 and Mg >2 -Advanced heart failure team and Nephrology consulted and appreciate their assistance  NSTEMI, Type 2 CAD s/p CABG x4 Patient has a significant history of CAD status post CABG x4 with repeat cath recently back in January 2022  showing new occlusions in the RCA and left circumflex not amenable to PCI.  Patient presented with significantly elevated troponin likely secondary to type II NSTEMI in the setting of cardiogenic shock.  He remains chest pain-free without any significant shortness of breath or palpitations. -Defer further procedure to cardiology -Continue ASA 81 mg and statin  Cardiorenal Syndrome: Hx of Stage Stage 3:  Hyperkalemia: Patient continues to have improvement of kidney function back to baseline of a creatinine of 1.33 and a GFR greater than 60.  He continues to have poor urine output with 150 cc last 24 hours.  -Nephrology consulted and appreciate their assistance -Likely hold CRRT in the near future to improve UOP -Avoid nephrotoxic medications when possible -Continue monitoring kidney function and urine output daily  GI bleed: Macrocytic anemia with elevated RDW Thrombocytopenia with bleeding from IV Patient has improved white blood cell count of 5.1 today with a hemoglobin of 7.7 and a platelet count of 68.  B12 was on the low end of normal around 300.  Methylmalonic acid and TSH pending.  We will continue to hold DVT prophylaxis in the setting of possible GI bleed.  Patient has had some melanotic/maroon-colored stools during his hospitalization with an FOBT positive.  His hemoglobin appears to be stable.  We will monitor him closely for additional signs of GI bleed. - Continue to hold DVT ppx - Start B12 supplementation.  - MMA and TSH pending - On PPI - Goal hgb >8.0  Intermittent Aflutter on Eliquis: -We will hold anticoagulation in setting of worsening hemoglobin -Continue to hold BB/CCB   IPF: Patient has a history of ILD/PF on chronic steroids thought to be secondary to IPF/amiodarone induced.  He is on stress dose steroids but will likely discontinue with improvement of hemodynamics in the near future. -Continue stress dose steroids, DC once hemodynamics improve   T2DM:  - Q4  hours CBG  - SSI  Goal CBG of 120-180  Best Practice:  Diet: Regular consistency (see orders) Pain/Anxiety/Delirium protocol (if indicated): N/A VAP protocol (if indicated): N/A DVT prophylaxis: SCD GI prophylaxis: N/A Glucose control: SSI Lines: Lines: Central line, Dialysis Catheter, and Arterial Line Foley: Foley:  Yes, and it is still needed Mobility: bed rest Code Status: full code Family Communication: pending Disposition: ICU  Labs   CBC: Recent Labs  Lab 04/27/2021 1918 04/29/2021 1929 04/06/21 2009 04/07/21 0313 04/07/21 1537 04/08/21 0413 04/09/21 0327  WBC 6.5   < > 4.4 3.7* 4.4 3.8* 5.1  NEUTROABS 4.9  --   --   --   --   --   --   HGB 11.5*   < > 7.5* 7.2* 8.0* 7.6* 7.7*  HCT 36.6*   < > 23.0* 21.7* 25.2* 23.4* 25.1*  MCV 128.0*   < > 118.6* 120.6* 113.5* 114.1* 117.8*  PLT PLATELET CLUMPS NOTED ON SMEAR, UNABLE TO ESTIMATE   < > 81* 70* 70* 59* 68*   < > = values in this interval not displayed.    Basic  Metabolic Panel: Recent Labs  Lab 04/05/21 0019 04/05/21 1255 04/06/21 0130 04/06/21 0500 04/07/21 0313 04/07/21 1537 04/08/21 0413 04/08/21 1600 04/08/21 1935 04/09/21 0327  NA 135   < > 130*   < > 135 136 137 135  --  136  K 5.4*   < > 5.2*   < > 4.6 4.6 4.2 4.2  --  4.0  CL 99   < > 101   < > 101 104 102 103  --  101  CO2 16*   < > 16*   < > _0 --  27  GLUCOSE 118*   < > 226*   < > 167* 234* 161* 229*  --  167*  BUN 106*   < > 98*   < > 45* 27* 21* 16  --  17  CREATININE 5.12*   < > 5.25*   < > 2.59* 1.70* 1.27* 1.66*  --  1.33*  CALCIUM 8.6*   < > 7.4*   < > 7.7* 7.9* 8.0* 7.9*  --  8.2*  MG 2.6*  --  2.2  --  2.2  --  2.4  --   --  2.5*  PHOS 7.8*   < >  --    < > 3.2 2.4* 1.4* 3.1 2.7 2.1*   < > = values in this interval not displayed.   GFR: Estimated Creatinine Clearance: 59.8 mL/min (A) (by C-G formula based on SCr of 1.33 mg/dL (H)). Recent Labs  Lab 04/28/2021 1944 04/19/2021 2219 04/05/21 0019 04/05/21 0208  04/05/21 1550 04/06/21 0130 04/06/21 0946 04/07/21 0313 04/07/21 1537 04/08/21 0413 04/09/21 0327  PROCALCITON  --   --  1.62  --   --  1.50  --  0.26  --   --   --   WBC  --   --  6.4  --   --  5.4   < > 3.7* 4.4 3.8* 5.1  LATICACIDVEN 4.6* 3.7*  --  3.2* 1.5  --   --   --   --   --   --    < > = values in this interval not displayed.    Liver Function Tests: Recent Labs  Lab 04/21/2021 2022 04/05/21 0019 04/06/21 0130 04/06/21 0500 04/07/21 0313 04/07/21 1537 04/08/21 0413 04/08/21 1600 04/09/21 0327  AST 22  --  33  --   --   --   --   --   --   ALT 12  --  22  --   --   --   --   --   --   ALKPHOS 38  --  61  --   --   --   --   --   --   BILITOT 0.4  --  0.9  --   --   --   --   --   --   PROT 3.3*  --  5.4*  --   --   --   --   --   --   ALBUMIN 1.7*   < > 2.7*   < > 2.7* 2.9* 2.9* 3.2* 3.3*   < > = values in this interval not displayed.   No results for input(s): LIPASE, AMYLASE in the last 168 hours. No results for input(s): AMMONIA in the last 168 hours.  ABG    Component Value Date/Time   PHART 7.392 07/24/2018 0722   PCO2ART 34.3 07/24/2018 0722  PO2ART 94.7 07/24/2018 0722   HCO3 23.2 07/29/2020 1154   HCO3 22.5 07/29/2020 1154   TCO2 16 (L) 04/20/2021 1929   ACIDBASEDEF 3.0 (H) 07/29/2020 1154   ACIDBASEDEF 4.0 (H) 07/29/2020 1154   O2SAT 64.0 04/09/2021 0327     Coagulation Profile: Recent Labs  Lab 04/06/21 0130  INR 1.4*    Cardiac Enzymes: No results for input(s): CKTOTAL, CKMB, CKMBINDEX, TROPONINI in the last 168 hours.  HbA1C: Hgb A1c MFr Bld  Date/Time Value Ref Range Status  04/05/2021 12:19 AM 5.5 4.8 - 5.6 % Final    Comment:    (NOTE) Pre diabetes:          5.7%-6.4%  Diabetes:              >6.4%  Glycemic control for   <7.0% adults with diabetes   06/14/2020 02:02 PM 6.8 (H) 4.6 - 6.5 % Final    Comment:    Glycemic Control Guidelines for People with Diabetes:Non Diabetic:  <6%Goal of Therapy: <7%Additional Action  Suggested:  >8%     CBG: Recent Labs  Lab 04/08/21 1209 04/08/21 1530 04/08/21 1939 04/08/21 2345 04/09/21 0333  GLUCAP 225* 234* 228* 214* 166*    Review of Systems:   See above  Past Medical History  He,  has a past medical history of AICD (automatic cardioverter/defibrillator) present, Anginal pain ([place]), Anxiety, Asthma, Atrial fibrillation-postoperative (11/28/2012), Cardiomyopathy, CHF (congestive heart failure) ([place]), Chronic back pain, Coronary artery disease, Depression, Diabetes mellitus type II (dx'd in the 1990's), GERD (gastroesophageal reflux disease), H/O hiatal hernia, Hemorrhoids, History of hypogonadism, History of kidney stones, Hyperlipidemia, Hypotension, OSA on CPAP, Pneumonia, and Urinary incontinence.   Surgical History    Past Surgical History:  Procedure Laterality Date   A-FLUTTER ABLATION N/A 09/12/2017   Procedure: A-FLUTTER ABLATION;  Surgeon: Deboraha Sprang, MD;  Location: [place] [facility];  Service: Cardiovascular;  Laterality: N/A;   CARDIAC DEFIBRILLATOR PLACEMENT  01/15/2015   CARDIOVERSION N/A 08/10/2017   Procedure: CARDIOVERSION;  Surgeon: Larey Dresser, MD;  [facility] ENDOSCOPY;  Service: Cardiovascular;  Laterality: N/A;   CORONARY ANGIOPLASTY WITH STENT PLACEMENT  ~ 2003   CORONARY ARTERY BYPASS GRAFT N/A 08/15/2012   Procedure: CORONARY ARTERY BYPASS GRAFTING (CABG);  Surgeon: Ivin Poot, MD;  Location: [place];  Service: Open Heart Surgery;  Laterality: N/A;  Coronary Artery Bypass Grafting Times Four Using Left Internal Mammary Artery and Right Saphenous Leg Vein Harvested Endoscopically   EP IMPLANTABLE DEVICE N/A 01/15/2015   Procedure: ICD Implant;  Surgeon: Deboraha Sprang, MD;  Location: [place] [facility];  Service: Cardiovascular;  Laterality: N/A;   LEFT HEART CATHETERIZATION WITH CORONARY ANGIOGRAM N/A 08/08/2012   Procedure: LEFT HEART CATHETERIZATION WITH CORONARY ANGIOGRAM;  Surgeon: Peter M Martinique, MD;  Location[facility] CATH  LAB;  Service: Cardiovascular;  Laterality: N/A;   LEFT HEART CATHETERIZATION WITH CORONARY/GRAFT ANGIOGRAM N/A 07/21/2014   Procedure: LEFT HEART CATHETERIZATION WITH Beatrix Fetters;  Surgeon: Larey Dresser, MD;  Location: [facility] CATH LAB;  Service: Cardiovascular;  Laterality: N/A;   MULTIPLE EXTRACTIONS WITH ALVEOLOPLASTY N/A 10/04/2017   Procedure: Extraction of tooth #'s 5 and 14 with alveoloplasty and gross debridement of remaining teeth;  Surgeon: [name], DDS;  Location: [place];  Service: Oral Surgery;  Laterality: N/A;   PERCUTANEOUS CORONARY STENT INTERVENTION (PCI-S)  07/21/2014   Procedure: PERCUTANEOUS CORONARY STENT INTERVENTION (PCI-S);  Surgeon: Larey Dresser, MD;  Location: [facility] CATH LAB;  Service: Cardiovascular;;   REFRACTIVE SURGERY Bilateral 1990's   RIGHT HEART CATH N/A 10/03/2017   Procedure: RIGHT HEART CATH;  Surgeon: Larey Dresser, MD;  Location: [place] [facility];  Service: Cardiovascular;  Laterality: N/A;   RIGHT HEART CATH N/A 12/11/2018   Procedure: RIGHT HEART CATH;  Surgeon: Larey Dresser, MD;  Location: [place] [facility];  Service: Cardiovascular;  Laterality: N/A;   RIGHT/LEFT HEART CATH AND CORONARY/GRAFT ANGIOGRAPHY N/A 07/29/2020   Procedure: RIGHT/LEFT HEART CATH AND CORONARY/GRAFT ANGIOGRAPHY;  Surgeon: Larey Dresser, MD;  Location: [place] [facility];  Service: Cardiovascular;  Laterality: N/A;   TEE WITHOUT CARDIOVERSION N/A 08/10/2017   Procedure: TRANSESOPHAGEAL ECHOCARDIOGRAM (TEE);  Surgeon: Larey Dresser, MD;  Location: [facility] ENDOSCOPY;  Service: Cardiovascular;  Laterality: N/A;   TONSILLECTOMY  ~ [place] Bilateral 11/28/2017   Procedure: VIDEO BRONCHOSCOPY WITHOUT FLUORO;  Surgeon: Brand Males, MD;  Location: [facility] ENDOSCOPY;  Service: Endoscopy;  Laterality: Bilateral;     Social History   reports that he has never smoked. His smokeless tobacco use includes chew. He reports current alcohol use of about 3.0  standard drinks per week. He reports that he does not use drugs.   Family History   His family history includes Diabetes in his father and mother; Hyperlipidemia in his father; Hypertension in his father and mother.   Allergies No Known Allergies   Home Medications  Prior to Admission medications   Medication Sig Start Date End Date Taking? Authorizing Provider  ACCU-CHEK FASTCLIX LANCETS MISC Use as directed once a day.  Dx code: E11.9 09/19/17  Yes Ann Held, DO  acetaminophen (TYLENOL) 500 MG tablet Take 1,000 mg by mouth every 6 (six) hours as needed for moderate pain or headache.   Yes [provider]  albuterol (ACCUNEB) 0.63 MG/3ML nebulizer solution Take 3 mLs (0.63 mg total) by nebulization every 6 (six) hours as needed for wheezing. 12/04/18  Yes Fenton Foy, NP  albuterol (VENTOLIN HFA) 108 (90 Base) MCG/ACT inhaler Inhale 2 puffs into the lungs every 6 (six) hours as needed for wheezing or shortness of breath.    Yes [provider]  allopurinol (ZYLOPRIM) 300 MG tablet Take 1 tablet (300 mg total) by mouth daily. 03/28/21  Yes Silverio Decamp, MD  apixaban (ELIQUIS) 5 MG TABS tablet TAKE 1 TABLET BY MOUTH 2 TIMES DAILY. Patient taking differently: Take 5 mg by mouth 2 (two) times daily. 08/03/20 08/03/21 Yes Larey Dresser, MD  atorvastatin (LIPITOR) 40 MG tablet Take 1 tablet (40 mg total) by mouth at bedtime. 02/22/21 02/22/22 Yes [place], Maricela Bo, FNP  blood glucose meter kit and supplies Dispense based on patient and insurance preference. Use as directed once a day. Dx Code E11.9. 09/06/17  Yes [name], Alferd Apa, DO  Blood Glucose Monitoring Suppl (ACCU-CHEK GUIDE) w/Device KIT 1 each by Does not apply route daily. DX Code: E11.9 09/19/17  Yes Lowne Lyndal Pulley R, DO  carvedilol (COREG) 3.125 MG tablet Take 1 tablet (3.125 mg total) by mouth 2 (two) times daily. 07/27/20  Yes Larey Dresser, MD  glucose blood (ACCU-CHEK GUIDE) test strip  Use as directed once a day.  Dx code: E11.9 09/19/17  Yes Ann Held, DO  guaiFENesin (MUCINEX) 600 MG 12 hr tablet Take 1 tablet (600 mg total) by mouth 2 (two) times daily as needed for to loosen phlegm. 08/17/17  Yes Shirley Friar, PA-C  metFORMIN (GLUCOPHAGE) 500 MG tablet Take 1 tablet (500 mg total) by mouth 2 (two) times daily with a meal. 10/20/20  Yes Shamleffer, Melanie Crazier, MD  nitroGLYCERIN (NITROSTAT) 0.4 MG SL tablet Place 1 tablet (0.4 mg total) under the tongue every 5 (five) minutes as needed for chest pain. 07/21/20  Yes Larey Dresser, MD  OXYGEN Inhale 3 L into the lungs continuous.   Yes [provider]  Pirfenidone (ESBRIET) 267 MG TABS Take 1 tab three times daily for 7 days, then 2 tabs three times daily for 7 days, then 3 tabs three times daily thereafter. Patient taking differently: Take 3 tablets by mouth daily. 03/02/21  Yes Brand Males, MD  predniSONE (DELTASONE) 10 MG tablet TAKE 1 TABLET (10 MG TOTAL) BY MOUTH DAILY WITH BREAKFAST. 02/23/21  Yes Larey Dresser, MD  sacubitril-valsartan (ENTRESTO) 24-26 MG TAKE 1 TABLET BY MOUTH 2 (TWO) TIMES DAILY. 04/06/20 04/30/21 Yes Larey Dresser, MD  spironolactone (ALDACTONE) 25 MG tablet Take 0.5 tablets (12.5 mg total) by mouth daily. 02/22/21 05/23/21 Yes [place], Maricela Bo, FNP  torsemide (DEMADEX) 20 MG tablet Take 40 mg by mouth daily. 03/16/21  Yes [provider]  fluticasone-salmeterol (ADVAIR HFA) 230-21 MCG/ACT inhaler Inhale 2 puffs into the lungs 2 (two) times daily. Patient not taking: Reported on 04/24/2021 11/26/20   Martyn Ehrich, NP  torsemide 40 MG TABS Take 40 mg by mouth daily. Patient not taking: Reported on 04/30/2021 03/16/21   Rafael Bihari, FNP     Critical care time: [place], D.O.  Internal Medicine Resident, PGY-3 [facility]  Pager: [phone] 6:04 AM, 04/09/2021

## 2021-04-09 NOTE — Progress Notes (Signed)
     Referral received for Vincent Chandler :goals of care discussion. Chart reviewed and updates received from RN. Patient assessed currently receiving dialysis. He would like his sister to be present understandably. Attempted to contact patient's sister, Jocelyn Lamer. Unable to reach. No voicemail available to leave message. Gay states he is expecting her to visit soon.   PMT will re-attempt to contact family at a later time/date and arranged [place] meeting. Detailed note and recommendations to follow once GOC has been completed.   Thank you for your referral and allowing PMT to assist in Mr.Vincent Kalata Ranker's care.   Alda Lea, AGPCNP-BC Palliative Medicine Team  Phone: [phone] Pager: [phone] Amion: N. [place]   NO CHARGE

## 2021-04-09 NOTE — Progress Notes (Signed)
Pt alert and aware, pt's sister was also present. She explained what the pt';s history was and then left the room to allow the pt and I to talk privately. Pt states he believes in God and prays but is fearful about death. I explored this issue with him. The chaplain offered caring and supportive presence, prayers and blessings. The pt and sister appreciated the visit.

## 2021-04-09 NOTE — Progress Notes (Signed)
Advanced Heart Failure Rounding Note  PCP-Cardiologist: None   Subjective:   10/6 Given 1uPRBC. Hgb 7.2>7.6    Remains anuric. On CVVHD @ -100 Weight down 1 pound  CVP 13-14  On milrinone 0.25 mcg + norepi 4 mcg + vasopressin 0.03 units. CO-OX 64%.   Denies CP, SOB, orthopnea or PND.   Hospice has been consulted. CCM has begun [place] discussions with him.    Objective:   Weight Range: 75.8 kg Body mass index is 24.68 kg/m.   Vital Signs:   Temp:  [97.4 F (36.3 C)-98.1 F (36.7 C)] 97.4 F (36.3 C) (10/08 1100) Pulse Rate:  [75-121] 97 (10/08 1045) Resp:  [14-26] 19 (10/08 1045) BP: (102-130)/(65-82) 102/65 (10/08 0800) SpO2:  [92 %-100 %] 98 % (10/08 1045) Arterial Line BP: (86-131)/(41-77) 109/56 (10/08 1045) Weight:  [75.8 kg] 75.8 kg (10/08 0342) Last BM Date: 04/08/21  Weight change: Filed Weights   04/07/21 0500 04/08/21 0500 04/09/21 0342  Weight: 79.9 kg 76.3 kg 75.8 kg    Intake/Output:   Intake/Output Summary (Last 24 hours) at 04/09/2021 1343 Last data filed at 04/09/2021 1300 Gross per 24 hour  Intake 1278.11 ml  Output 3226 ml  Net -1947.89 ml       Physical Exam   General:  Weak appearing. No resp difficulty HEENT: normal  + ecchymosis on left forehead Neck: supple. JVP to ear  Carotids 2+ bilat; no bruits. No lymphadenopathy or thryomegaly appreciated. + CVVHD cath Cor: PMI nondisplaced. Irregular rate & rhythm. No rubs, gallops or murmurs. Lungs: + crackles  Abdomen: soft, nontender, nondistended. No hepatosplenomegaly. No bruits or masses. Good bowel sounds. Extremities: no cyanosis, clubbing, rash, 1-2+ edema Neuro: alert & orientedx3, cranial nerves grossly intact. moves all 4 extremities w/o difficulty. Affect pleasant   Telemetry   AF 90-100 Personally reviewed   Labs    CBC Recent Labs    04/08/21 0413 04/09/21 0327  WBC 3.8* 5.1  HGB 7.6* 7.7*  HCT 23.4* 25.1*  MCV 114.1* 117.8*  PLT 59* 68*    Basic Metabolic  Panel Recent Labs    04/08/21 0413 04/08/21 1600 04/08/21 1935 04/09/21 0327  NA 137 135  --  136  K 4.2 4.2  --  4.0  CL 102 103  --  101  CO2 26 24  --  27  GLUCOSE 161* 229*  --  167*  BUN 21* 16  --  17  CREATININE 1.27* 1.66*  --  1.33*  CALCIUM 8.0* 7.9*  --  8.2*  MG 2.4  --   --  2.5*  PHOS 1.4* 3.1 2.7 2.1*    Liver Function Tests Recent Labs    04/08/21 1600 04/09/21 0327  ALBUMIN 3.2* 3.3*    No results for input(s): LIPASE, AMYLASE in the last 72 hours. Cardiac Enzymes No results for input(s): CKTOTAL, CKMB, CKMBINDEX, TROPONINI in the last 72 hours.  BNP: BNP (last 3 results) Recent Labs    12/14/20 1255 02/15/21 1244 03/16/21 1422  BNP 768.9* 923.6* 1,237.2*     ProBNP (last 3 results) No results for input(s): PROBNP in the last 8760 hours.   D-Dimer No results for input(s): DDIMER in the last 72 hours. Hemoglobin A1C No results for input(s): HGBA1C in the last 72 hours.  Fasting Lipid Panel No results for input(s): CHOL, HDL, LDLCALC, TRIG, CHOLHDL, LDLDIRECT in the last 72 hours. Thyroid Function Tests Recent Labs    04/09/21 0700  TSH 1.256  Other results:   Imaging    No results found.   Medications:     Scheduled Medications:  aspirin EC  81 mg Oral Daily   atorvastatin  40 mg Oral Daily   B-complex with vitamin C  1 tablet Oral Daily   Chlorhexidine Gluconate Cloth  6 each Topical Daily   feeding supplement  237 mL Oral TID BM   hydrocortisone sod succinate (SOLU-CORTEF) inj  100 mg Intravenous Q8H   influenza vac split quadrivalent PF  0.5 mL Intramuscular Tomorrow-1000   insulin aspart  0-9 Units Subcutaneous Q4H   mouth rinse  15 mL Mouth Rinse BID   midodrine  10 mg Oral TID with meals   mometasone-formoterol  2 puff Inhalation BID   pantoprazole (PROTONIX) IV  40 mg Intravenous Daily   [START ON 04/10/2021] phosphorus  500 mg Oral BID   sodium chloride flush  10-40 mL Intracatheter Q12H   vitamin B-12   1,000 mcg Oral Daily    Infusions:   prismasol BGK 4/2.5 400 mL/hr at 04/09/21 0141    prismasol BGK 4/2.5 300 mL/hr at 04/09/21 0546   sodium chloride     milrinone 0.25 mcg/kg/min (04/09/21 1300)   norepinephrine (LEVOPHED) Adult infusion 4 mcg/min (04/09/21 1300)   prismasol BGK 4/2.5 1,500 mL/hr at 04/09/21 1134   vasopressin 0.03 Units/min (04/09/21 1300)    PRN Medications: albuterol, docusate sodium, fentaNYL (SUBLIMAZE) injection, heparin, lip balm, polyethylene glycol, sodium chloride, sodium chloride flush    Patient Profile   Mr. Liska is a 60 yo male with history of chronic systolic HF, CAD s/p CABG AFL s/p ablation and atrial fib, hx VT, ILD d/t amio toxicity and stage III CKD. Admitted yesterday with syncope and a/c biventricular failure/cardiogenic shock.  Assessment/Plan    1. Acute on chronic systolic CHF: Ischemic cardiomyopathy. Medtronic ICD single chamber. 2/19 admission for decompensated HF requiring milrinone. Echo 2/7/019 EF 15% Mildly dilated RV. CPX in 2/19 was suggestive of severe limitation due to HF.   Has IVCD, not LBBB-like => saw Dr. Caryl Comes, will not upgrade ICD to CRT. Echo 3/21 with EF 20-25%.  Echo this admission with EF < 20%, moderate RV dysfunction.  Admitted with hypotension, AKI and concern for cardiogenic shock.   - .  He has been started on CVVH pulling around 50 cc/hr net negative UF.  - Continue CVVH, aim for net negative UF 50-100 cc/hr today.  - CO-OX 62%. Continue milrinone 0.25.  NE lower at 2 mcg+ vasopressin 0.03 units.   - Continue midodrine 10 mg tid.  - With significant ILD and now renal failure, not an LVAD or transplant candidate.   -  He remains quite tenuous with significant pressor/inotrope requirement. [place] discussions have been started by Drs. Mclean and Mcquaid. He has advanced lung disease and systolic HF. Now c/b  cardiogenic shock -> ESRD. Current plan is to try to get CVP down to 10 with CVVHD then hold CVVHD. If no renal  recovery hospice would be the only answer. I have discussed with him and his sister. They realize how tenuous he is. He would like to continue aggressive care but now agrees that heroic measures likely would not improve things.  - Will change Code Status to DNR/DNI 2. VT: 01/13/21. Asymptomatic. 1 ATP -> 1 shock that was successful. Device interrogated this admission, no VT.  3. Afib/ Atrial flutter: Admitted in 2/19 with severe HF decompensation in setting of atrial flutter with RVR.  He was  cardioverted.  He then had atrial flutter ablation in 3/19. He is off amiodarone due to question of amiodarone lung toxicity. No palpitations.  He is back in atrial fibrillation with mild RVR this admission.  - remains in A fib. Rate has improved.  - Not candidate for amiodarone with concern for prior lung toxicity.    - Anticoagulation on hold for now with drop in hgb and +FOBT.  - Consider eventual DCCV but not yet.  4. CAD: S/p CABG.  Last intervention was PCI to LCx in 2016. Repeat cath (1/22) with occluded SVG-OM and SVG-RCA with patent LIMA-LAD and SVG-D, occluded proximal RCA, and newly occluded mid LCX. No targets for revascularization.  No chest pain but HS-TnI elevated to 3988 this admission.  Not felt to be true ACS, suspect demand ischemia with profound shock.  - No s/s angina  - Continue atorvastatin.  - ASA 81 for now.  5. Pulmonary fibrosis: PFTs in 2/19 were restrictive, and high resolution CT showed interstitial lung disease. Amiodarone was stopped due to concern for possible toxicity.  He has seen [name] => bronchoscopy suggestive of ILD related to amiodarone.  He has been on chronic prednisone.  High resolution CT chest repeated in 6/20, again showing ILD, described at UIP pattern. He has started Ofev. I think that a significant amount of his symptomatology is due to pulmonary fibrosis. Baseline on home oxygen.  - Stress dose steroids (hydrocortisone).  - CCM following 6. OSA: Uses  CPAP 7. AKI on CKD stage 3: Possible ATN due to hypotension (sound like BP may have been low for a couple of weeks prior to admission based on symptoms). Still making some urine (175 cc yesterday).  - Continue CVVHD  aim for 50-100 cc/hr net UF. 8. Pulmonary hypertension: Suspect group 3 PH due to lung disease (ILD). RHC (1/22) with moderate pulmonary arterial hypertension, PVR 4.2 WU. - He did not tolerate Tyvaso for PH-ILD. 9. DM2: He did not tolerate [place].  10. Thrombocytopenia: Suspect related to critical illness,trending down 59>70  K today.  He is not currently anticoagulated.  11. ID: Afebrile, he is on empiric ceftriaxone but no definite sign of infection.  12. GI bleeding: FOBT+ with hgb falling - Anticoagulation on hold for now.  - PPI.  - Received 1UPRBC 10/6 hemoglobin 7.2>7.6. hgb 7.7 today  13. RUE swelling: [place] negative for DVT.  14. Thrombocytopenia: Suspect due to acute inflammatory illness.   CRITICAL CARE Performed by: Glori Bickers  Total critical care time: 35 minutes  Critical care time was exclusive of separately billable procedures and treating other patients.  Critical care was necessary to treat or prevent imminent or life-threatening deterioration.  Critical care was time spent personally by me (independent of midlevel providers or residents) on the following activities: development of treatment plan with patient and/or surrogate as well as nursing, discussions with consultants, evaluation of patient's response to treatment, examination of patient, obtaining history from patient or surrogate, ordering and performing treatments and interventions, ordering and review of laboratory studies, ordering and review of radiographic studies, pulse oximetry and re-evaluation of patient's condition.   Length of Stay: [place], MD  04/09/2021, 1:43 PM  Advanced Heart Failure Team Pager [phone] (M-F; 7a - 5p)  Please contact [facility] for  night-coverage after hours (5p -7a ) and weekends on [email or web address]

## 2021-04-10 DIAGNOSIS — Z7189 Other specified counseling: Secondary | ICD-10-CM

## 2021-04-10 DIAGNOSIS — Z515 Encounter for palliative care: Secondary | ICD-10-CM

## 2021-04-10 DIAGNOSIS — N179 Acute kidney failure, unspecified: Secondary | ICD-10-CM | POA: Diagnosis not present

## 2021-04-10 DIAGNOSIS — I959 Hypotension, unspecified: Secondary | ICD-10-CM | POA: Diagnosis not present

## 2021-04-10 DIAGNOSIS — I509 Heart failure, unspecified: Secondary | ICD-10-CM

## 2021-04-10 DIAGNOSIS — E440 Moderate protein-calorie malnutrition: Secondary | ICD-10-CM | POA: Diagnosis not present

## 2021-04-10 DIAGNOSIS — R570 Cardiogenic shock: Secondary | ICD-10-CM | POA: Diagnosis not present

## 2021-04-10 DIAGNOSIS — N178 Other acute kidney failure: Secondary | ICD-10-CM | POA: Diagnosis not present

## 2021-04-10 LAB — CBC
HCT: 26.1 % — ABNORMAL LOW (ref 39.0–52.0)
Hemoglobin: 8.3 g/dL — ABNORMAL LOW (ref 13.0–17.0)
MCH: 37.7 pg — ABNORMAL HIGH (ref 26.0–34.0)
MCHC: 31.8 g/dL (ref 30.0–36.0)
MCV: 118.6 fL — ABNORMAL HIGH (ref 80.0–100.0)
Platelets: 75 10*3/uL — ABNORMAL LOW (ref 150–400)
RBC: 2.2 MIL/uL — ABNORMAL LOW (ref 4.22–5.81)
RDW: 22.8 % — ABNORMAL HIGH (ref 11.5–15.5)
WBC: 6.9 10*3/uL (ref 4.0–10.5)
nRBC: 4.3 % — ABNORMAL HIGH (ref 0.0–0.2)

## 2021-04-10 LAB — COOXEMETRY PANEL
Carboxyhemoglobin: 1.3 % (ref 0.5–1.5)
Methemoglobin: 0.8 % (ref 0.0–1.5)
O2 Saturation: 72 %
Total hemoglobin: 8.3 g/dL — ABNORMAL LOW (ref 12.0–16.0)

## 2021-04-10 LAB — RENAL FUNCTION PANEL
Albumin: 3.2 g/dL — ABNORMAL LOW (ref 3.5–5.0)
Anion gap: 7 (ref 5–15)
BUN: 13 mg/dL (ref 6–20)
CO2: 26 mmol/L (ref 22–32)
Calcium: 8.2 mg/dL — ABNORMAL LOW (ref 8.9–10.3)
Chloride: 102 mmol/L (ref 98–111)
Creatinine, Ser: 1.15 mg/dL (ref 0.61–1.24)
GFR, Estimated: >60 mL/min (ref >60)
Glucose, Bld: 150 mg/dL — ABNORMAL HIGH (ref 70–99)
Phosphorus: 1.6 mg/dL — ABNORMAL LOW (ref 2.5–4.6)
Potassium: 4.3 mmol/L (ref 3.5–5.1)
Sodium: 135 mmol/L (ref 135–145)

## 2021-04-10 LAB — GLUCOSE, CAPILLARY
Glucose-Capillary: 157 mg/dL — ABNORMAL HIGH (ref 70–99)
Glucose-Capillary: 169 mg/dL — ABNORMAL HIGH (ref 70–99)
Glucose-Capillary: 213 mg/dL — ABNORMAL HIGH (ref 70–99)

## 2021-04-10 LAB — MAGNESIUM: Magnesium: 2.6 mg/dL — ABNORMAL HIGH (ref 1.7–2.4)

## 2021-04-10 MED ORDER — POLYVINYL ALCOHOL 1.4 % OP SOLN: 1.0000 [drp] | Freq: Four times a day (QID) | OPHTHALMIC | Status: DC | PRN

## 2021-04-10 MED ORDER — ACETAMINOPHEN 650 MG RE SUPP: 650.0000 mg | Freq: Four times a day (QID) | RECTAL | Status: DC | PRN

## 2021-04-10 MED ORDER — HALOPERIDOL LACTATE 2 MG/ML PO CONC
0.5000 mg | ORAL | Status: DC | PRN
  Filled 2021-04-10: qty 0.3

## 2021-04-10 MED ORDER — ACETAMINOPHEN 325 MG PO TABS: 650.0000 mg | ORAL_TABLET | Freq: Four times a day (QID) | ORAL | Status: DC | PRN

## 2021-04-10 MED ORDER — LORAZEPAM 1 MG PO TABS: 1.0000 mg | ORAL_TABLET | ORAL | Status: DC | PRN

## 2021-04-10 MED ORDER — LORAZEPAM 2 MG/ML IJ SOLN: 1.0000 mg | INTRAMUSCULAR | Status: DC | PRN

## 2021-04-10 MED ORDER — HALOPERIDOL 0.5 MG PO TABS
0.5000 mg | ORAL_TABLET | ORAL | Status: DC | PRN
  Administered 2021-04-10: 0.5 mg via ORAL
  Filled 2021-04-10: qty 1

## 2021-04-10 MED ORDER — K PHOS MONO-SOD PHOS DI & MONO 155-852-130 MG PO TABS
500.0000 mg | ORAL_TABLET | Freq: Three times a day (TID) | ORAL | Status: DC
  Administered 2021-04-10 (×2): 500 mg via ORAL
  Filled 2021-04-10 (×3): qty 2

## 2021-04-10 MED ORDER — BIOTENE DRY MOUTH MT LIQD: 15.0000 mL | OROMUCOSAL | Status: DC | PRN

## 2021-04-10 MED ORDER — HYDROMORPHONE HCL 1 MG/ML IJ SOLN: 0.5000 mg | INTRAMUSCULAR | Status: DC | PRN

## 2021-04-10 MED ORDER — SODIUM PHOSPHATES 45 MMOLE/15ML IV SOLN
45.0000 mmol | Freq: Once | INTRAVENOUS | Status: AC
  Administered 2021-04-10: 45 mmol via INTRAVENOUS
  Filled 2021-04-10: qty 15

## 2021-04-10 MED ORDER — ONDANSETRON 4 MG PO TBDP: 4.0000 mg | ORAL_TABLET | Freq: Four times a day (QID) | ORAL | Status: DC | PRN

## 2021-04-10 MED ORDER — ONDANSETRON HCL 4 MG/2ML IJ SOLN: 4.0000 mg | Freq: Four times a day (QID) | INTRAMUSCULAR | Status: DC | PRN

## 2021-04-10 MED ORDER — LORAZEPAM 2 MG/ML PO CONC: 1.0000 mg | ORAL | Status: DC | PRN

## 2021-04-10 MED ORDER — HALOPERIDOL LACTATE 5 MG/ML IJ SOLN: 0.5000 mg | INTRAMUSCULAR | Status: DC | PRN

## 2021-04-10 NOTE — Progress Notes (Addendum)
Advanced Heart Failure Rounding Note  PCP-Cardiologist: None   Subjective:   10/6 Given 1uPRBC. Hgb 7.2>7.6    Remains on CVVHD @ -100 Weight down 2 pounds overnight. Only 100cc urine out  CVP remains 13-14  On milrinone 0.25 mcg + norepi down 4 mcg -> 2 mcg + vasopressin 0.03 units. CO-OX 72%.   Feels weak. Denies SOB, orthopnea or PND.   Now DNR/DNI. Spoke with Palliative this am and plan is to transition to comfort care  Objective:   Weight Range: 75 kg Body mass index is 24.42 kg/m.   Vital Signs:   Temp:  [97.6 F (36.4 C)-97.7 F (36.5 C)] 97.6 F (36.4 C) (10/09 0813) Pulse Rate:  [79-130] 110 (10/09 1100) Resp:  [14-30] 21 (10/09 1100) BP: (106-123)/(65-86) 116/80 (10/09 0800) SpO2:  [78 %-100 %] 94 % (10/09 1100) Arterial Line BP: (91-138)/(40-82) 109/55 (10/09 1100) Weight:  [75 kg] 75 kg (10/09 0136) Last BM Date: 04/08/21  Weight change: Filed Weights   04/08/21 0500 04/09/21 0342 04/10/21 0136  Weight: 76.3 kg 75.8 kg 75 kg    Intake/Output:   Intake/Output Summary (Last 24 hours) at 04/10/2021 1109 Last data filed at 04/10/2021 1100 Gross per 24 hour  Intake 1236.45 ml  Output 3403 ml  Net -2166.55 ml       Physical Exam   General:  Weak appearing. No resp difficulty HEENT: normal  + ecchymosis on left forehead Neck: supple. + CVVHD cath JVP up Carotids 2+ bilat; no bruits. No lymphadenopathy or thryomegaly appreciated. Cor: PMI nondisplaced. Irregular rate & rhythm. No rubs, gallops or murmurs. Lungs: clear Abdomen: soft, nontender, nondistended. No hepatosplenomegaly. No bruits or masses. Good bowel sounds. Extremities: no cyanosis, clubbing, rash, tr edema Neuro: alert & orientedx3, cranial nerves grossly intact. moves all 4 extremities w/o difficulty. Affect pleasant   Telemetry   AF 100-110 Personally reviewed   Labs    CBC Recent Labs    04/09/21 0327 04/10/21 0333  WBC 5.1 6.9  HGB 7.7* 8.3*  HCT 25.1* 26.1*  MCV  117.8* 118.6*  PLT 68* 75*    Basic Metabolic Panel Recent Labs    04/09/21 0327 04/09/21 1626 04/10/21 0333  NA 136 135 135  K 4.0 4.1 4.3  CL 101 103 102  CO2 _0 GLUCOSE 167* 254* 150*  BUN _1 CREATININE 1.33* 1.25* 1.15  CALCIUM 8.2* 8.0* 8.2*  MG 2.5*  --  2.6*  PHOS 2.1* 2.2* 1.6*    Liver Function Tests Recent Labs    04/09/21 1626 04/10/21 0333  ALBUMIN 3.2* 3.2*    No results for input(s): LIPASE, AMYLASE in the last 72 hours. Cardiac Enzymes No results for input(s): CKTOTAL, CKMB, CKMBINDEX, TROPONINI in the last 72 hours.  BNP: BNP (last 3 results) Recent Labs    12/14/20 1255 02/15/21 1244 03/16/21 1422  BNP 768.9* 923.6* 1,237.2*     ProBNP (last 3 results) No results for input(s): PROBNP in the last 8760 hours.   D-Dimer No results for input(s): DDIMER in the last 72 hours. Hemoglobin A1C No results for input(s): HGBA1C in the last 72 hours.  Fasting Lipid Panel No results for input(s): CHOL, HDL, LDLCALC, TRIG, CHOLHDL, LDLDIRECT in the last 72 hours. Thyroid Function Tests Recent Labs    04/09/21 0700  TSH 1.256     Other results:   Imaging    No results found.   Medications:     Scheduled Medications:  aspirin EC  81 mg Oral Daily   atorvastatin  40 mg Oral Daily   B-complex with vitamin C  1 tablet Oral Daily   Chlorhexidine Gluconate Cloth  6 each Topical Daily   feeding supplement  237 mL Oral TID BM   hydrocortisone sod succinate (SOLU-CORTEF) inj  100 mg Intravenous Q8H   influenza vac split quadrivalent PF  0.5 mL Intramuscular Tomorrow-1000   insulin aspart  0-9 Units Subcutaneous Q4H   mouth rinse  15 mL Mouth Rinse BID   midodrine  10 mg Oral TID with meals   mometasone-formoterol  2 puff Inhalation BID   pantoprazole (PROTONIX) IV  40 mg Intravenous Daily   phosphorus  500 mg Oral TID   sodium chloride flush  10-40 mL Intracatheter Q12H   vitamin B-12  1,000 mcg Oral Daily     Infusions:   prismasol BGK 4/2.5 400 mL/hr at 04/10/21 0400    prismasol BGK 4/2.5 300 mL/hr at 04/09/21 2335   sodium chloride     milrinone 0.25 mcg/kg/min (04/10/21 1100)   norepinephrine (LEVOPHED) Adult infusion 1.8 mcg/min (04/10/21 1100)   prismasol BGK 4/2.5 1,500 mL/hr at 04/10/21 9735   sodium phosphate  Dextrose 5% IVPB 44 mL/hr at 04/10/21 1100   vasopressin 0.03 Units/min (04/10/21 1100)    PRN Medications: albuterol, docusate sodium, fentaNYL (SUBLIMAZE) injection, heparin, lip balm, polyethylene glycol, sodium chloride, sodium chloride flush    Patient Profile   Mr. Stencil is a 60 yo male with history of chronic systolic HF, CAD s/p CABG AFL s/p ablation and atrial fib, hx VT, ILD d/t amio toxicity and stage III CKD. Admitted yesterday with syncope and a/c biventricular failure/cardiogenic shock.  Assessment/Plan    1. Acute on chronic systolic CHF: Ischemic cardiomyopathy. Medtronic ICD single chamber. 2/19 admission for decompensated HF requiring milrinone. Echo 2/7/019 EF 15% Mildly dilated RV. CPX in 2/19 was suggestive of severe limitation due to HF.   Has IVCD, not LBBB-like => saw Dr. Caryl Comes, will not upgrade ICD to CRT. Echo 3/21 with EF 20-25%.  Echo this admission with EF < 20%, moderate RV dysfunction.  Admitted with hypotension, AKI and concern for cardiogenic shock.   - Remains on CVVH pulling - 100cc/hr net negative UF.  - CO-OX 72%. on milrinone 0.25, NE 2 mcg, vasopressin 0.03 units.  - Continue midodrine 10 mg tid.  - With significant ILD and now renal failure, not an LVAD or transplant candidate.   -  Code status changed to DNR/DNI yesterday - Long discussion with Palliative Care today and plan will be to transition to comfort care. Will keep drips going for now until family has time to visit.  - He wants to come off CVVHD and get trialysis cath out. Agree with that plan. 2. VT: 01/13/21. Asymptomatic. 1 ATP -> 1 shock that was successful. Device  interrogated this admission, no VT.  3. Afib/ Atrial flutter: Admitted in 2/19 with severe HF decompensation in setting of atrial flutter with RVR.  He was cardioverted.  He then had atrial flutter ablation in 3/19. He is off amiodarone due to question of amiodarone lung toxicity. No palpitations.  He is back in atrial fibrillation with mild RVR this admission.  - remains in A fib. Rate mildly elevated today - Not candidate for amiodarone with concern for prior lung toxicity.    - Anticoagulation on hold for now with drop in hgb and +FOBT.  4. CAD: S/p CABG.  Last intervention was PCI  to LCx in 2016. Repeat cath (1/22) with occluded SVG-OM and SVG-RCA with patent LIMA-LAD and SVG-D, occluded proximal RCA, and newly occluded mid LCX. No targets for revascularization.  No chest pain but HS-TnI elevated to 3988 this admission.  Not felt to be true ACS, suspect demand ischemia with profound shock.  - No s/s angina - Continue atorvastatin.  - ASA 81 for now.  5. Pulmonary fibrosis: PFTs in 2/19 were restrictive, and high resolution CT showed interstitial lung disease. Amiodarone was stopped due to concern for possible toxicity.  He has seen [name] => bronchoscopy suggestive of ILD related to amiodarone.  He has been on chronic prednisone.  High resolution CT chest repeated in 6/20, again showing ILD, described at UIP pattern. He has started Ofev. I think that a significant amount of his symptomatology is due to pulmonary fibrosis. Baseline on home oxygen.  6. OSA: Uses CPAP 7. AKI on CKD stage 3: Possible ATN due to hypotension (sound like BP may have been low for a couple of weeks prior to admission based on symptoms). Still making some urine (100 cc yesterday).  - He wants to come off CVVHD and get trialysis cath out. Agree with that plan. 8. Pulmonary hypertension: Suspect group 3 PH due to lung disease (ILD). RHC (1/22) with moderate pulmonary arterial hypertension, PVR 4.2 WU. - He did not  tolerate Tyvaso for PH-ILD. 9. DM2: He did not tolerate [place].  10. Thrombocytopenia: Suspect related to critical illness,trending down 59>70  K today.  He is not currently anticoagulated.  11. ID: Afebrile, he is on empiric ceftriaxone but no definite sign of infection.  12. GI bleeding: FOBT+ with hgb falling - Anticoagulation on hold for now.  - PPI.  - Received 1UPRBC 10/6 hemoglobin 7.2>7.6. hgb 8.3 today  13. RUE swelling: [place] negative for DVT.  14. Thrombocytopenia: Suspect due to acute inflammatory illness.  - PLTs stable 75-80k. No bleeding  Appreciate Palliative Care input. Agree with plan to switch to Comfort [facility].   CRITICAL CARE Performed by: Glori Bickers  Total critical care time: 35 minutes  Critical care time was exclusive of separately billable procedures and treating other patients.  Critical care was time spent personally by me (independent of midlevel providers or residents) on the following activities: development of treatment plan with patient and/or surrogate as well as nursing, discussions with consultants, evaluation of patient's response to treatment, examination of patient, obtaining history from patient or surrogate, ordering and performing treatments and interventions, ordering and review of laboratory studies, ordering and review of radiographic studies, pulse oximetry and re-evaluation of patient's condition.   Length of Stay: [place], MD  04/10/2021, 11:09 AM  Advanced Heart Failure Team Pager [phone] (M-F; 7a - 5p)  Please contact [facility] for night-coverage after hours (5p -7a ) and weekends on [email or web address]

## 2021-04-10 NOTE — Progress Notes (Signed)
NAME:  Vincent Chandler, MRN:  [id number], DOB:  1961/01/02, LOS: 39 ADMISSION DATE:  04/05/2021, CONSULTATION DATE:  10/3 REFERRING MD:  EDP, CHIEF COMPLAINT:  Syncope   History of Present Illness:  60 y/o male with multiple cardiac problems presented with acute on chronic systolic heart failure causing cardiogenic shock. Since admission he has started on CRRT for AKI and has required multiple vasopressors.  Pertinent  Medical History  HFrEF (EF of 25-30%) s/p AICD  CAD s/p CABG x4 Intermittent Afib  HLD OSA on CPAP ILD on chronic steroid > UIP pattern T2DM CKD Stage 3  Significant Hospital Events: Including procedures, antibiotic start and stop dates in addition to other pertinent events   10/03 - consulted for hyperkalemia and hypotension and admitted to [facility] 10/04 -advanced heart failure team consulted, central line placed left IJ and left more, right femoral A-line placed 10/04 - tried to initiate CRRT without success due to HD line problems.  10/05 - HD cath inserted in Rt IJ and resumed CRRT 10/06 - started midodrine and weaned off levo 10/07 - increased midodrine and remains on levo, milrinone, and vaso 10/8 - met with HF team, elected to change code status to DNR  Consults Advanced heart failure Nephrology  Diagnostic tests 02/2020 HRCT > UIP 10/3 CT HEAD > NAICP, generalized atrophy, paranasal sinus disease 10/3 CT c-spine > no bony abnormality, multi-level degenerative changes 10/5 Echo > LVEF < 20% global hypokinesis, RV systolic function moderately reduced 10/6 Upper ext vasc ultrasound bilaterally > no DVT  Micro 10/3 SARS COV 2/FLU > neg 10/3 blood culture > neg  Abx 10/3 ceftriaxone > 10/7  Interim History / Subjective:  Continued volume removal with CRRT overnight Remains on vasopressors> milrinone, norepinephrine, vasopressin Coox up this morning constipated  Objective   Blood pressure 106/80, pulse 79, temperature 97.6 F (36.4 C), temperature  source Oral, resp. rate 14, height 5' 9" (1.753 m), weight 75 kg, SpO2 100 %. CVP:  [14 mmHg-17 mmHg] 15 mmHg      Intake/Output Summary (Last 24 hours) at 04/10/2021 0757 Last data filed at 04/10/2021 0700 Gross per 24 hour  Intake 1178.12 ml  Output 3412 ml  Net -2233.88 ml   Filed Weights   04/08/21 0500 04/09/21 0342 04/10/21 0136  Weight: 76.3 kg 75.8 kg 75 kg    Examination:  General:  Chronically ill appearing, resting comfortably in bed HENT: NCAT OP clear PULM: CTA B, normal effort CV: RRR, no mgr GI: BS+, soft, nontender MSK: diminished bulk and tone Neuro: awake, alert, no distress, MAEW  Resolved Hospital Problem list     Assessment & Plan:  Cardiogenic shock  Acute on chronic systolic heart failure, ischemic cardiomyopathy Continue milrinone, vasopressin and levophed Will discuss transitioning off these therapies with heart failure and palliative medicine  NSTEMI CAD s/p CABG x4 Tele  AKI > Cardiorenal syndrome CKD 3a Hyperkalemia Continue volume removal with CRRT Monitor BMET and UOP Replace electrolytes as needed  GI bleed/melena> resolved Macrocytic anemia Thrombocytopenia Monitor for bleeding Transfuse PRBC for Hgb < 7 gm/dL SCD for DVT prophylaxis  ILD> UIP pattern on antifibrotics (pirfenidone, prednisone) Continue hydrocortisone for now Continue oxygen as needed to maintain O2 saturation > 88%  DM2 SSI  Prognosis, goals of care: Vincent Chandler's heart is too weak to tolerate intermittent HD and he is not a good candidate for long term HD any way.  Further, his cardiogenic shock is such that he remains on multiple vasopressors and inotropes with  no hope of improvement.  I explained to Vincent Chandler that he will not survive this illness and we will need to make plans with palliative medicine for his death over the next few days.    Best Practice (right click and "Reselect all SmartList Selections" daily)   Diet/type: Regular consistency (see orders) DVT  prophylaxis: SCD GI prophylaxis: N/A Lines: Dialysis Catheter and yes and it is still needed Foley:  N/A Code Status:  DNR Last date of multidisciplinary goals of care discussion [10/9, palliative medicine to see him today]  Labs   CBC: Recent Labs  Lab 05/02/2021 1918 04/19/2021 1929 04/07/21 0313 04/07/21 1537 04/08/21 0413 04/09/21 0327 04/10/21 0333  WBC 6.5   < > 3.7* 4.4 3.8* 5.1 6.9  NEUTROABS 4.9  --   --   --   --   --   --   HGB 11.5*   < > 7.2* 8.0* 7.6* 7.7* 8.3*  HCT 36.6*   < > 21.7* 25.2* 23.4* 25.1* 26.1*  MCV 128.0*   < > 120.6* 113.5* 114.1* 117.8* 118.6*  PLT PLATELET CLUMPS NOTED ON SMEAR, UNABLE TO ESTIMATE   < > 70* 70* 59* 68* 75*   < > = values in this interval not displayed.    Basic Metabolic Panel: Recent Labs  Lab 04/06/21 0130 04/06/21 0500 04/07/21 0313 04/07/21 1537 04/08/21 0413 04/08/21 1600 04/08/21 1935 04/09/21 0327 04/09/21 1626 04/10/21 0333  NA 130*   < > 135   < > 137 135  --  136 135 135  K 5.2*   < > 4.6   < > 4.2 4.2  --  4.0 4.1 4.3  CL 101   < > 101   < > 102 103  --  101 103 102  CO2 16*   < > 22   < > 26 24  --  _0 GLUCOSE 226*   < > 167*   < > 161* 229*  --  167* 254* 150*  BUN 98*   < > 45*   < > 21* 16  --  _1 CREATININE 5.25*   < > 2.59*   < > 1.27* 1.66*  --  1.33* 1.25* 1.15  CALCIUM 7.4*   < > 7.7*   < > 8.0* 7.9*  --  8.2* 8.0* 8.2*  MG 2.2  --  2.2  --  2.4  --   --  2.5*  --  2.6*  PHOS  --    < > 3.2   < > 1.4* 3.1 2.7 2.1* 2.2* 1.6*   < > = values in this interval not displayed.   GFR: Estimated Creatinine Clearance: 69.2 mL/min (by C-G formula based on SCr of 1.15 mg/dL). Recent Labs  Lab 04/24/2021 1944 04/19/2021 2219 04/05/21 0019 04/05/21 0208 04/05/21 1550 04/06/21 0130 04/06/21 0946 04/07/21 0313 04/07/21 1537 04/08/21 0413 04/09/21 0327 04/10/21 0333  PROCALCITON  --   --  1.62  --   --  1.50  --  0.26  --   --   --   --   WBC  --   --  6.4  --   --  5.4   < > 3.7* 4.4 3.8*  5.1 6.9  LATICACIDVEN 4.6* 3.7*  --  3.2* 1.5  --   --   --   --   --   --   --    < > = values in this interval  not displayed.    Liver Function Tests: Recent Labs  Lab 04/23/2021 2022 04/05/21 0019 04/06/21 0130 04/06/21 0500 04/08/21 0413 04/08/21 1600 04/09/21 0327 04/09/21 1626 04/10/21 0333  AST 22  --  33  --   --   --   --   --   --   ALT 12  --  22  --   --   --   --   --   --   ALKPHOS 38  --  61  --   --   --   --   --   --   BILITOT 0.4  --  0.9  --   --   --   --   --   --   PROT 3.3*  --  5.4*  --   --   --   --   --   --   ALBUMIN 1.7*   < > 2.7*   < > 2.9* 3.2* 3.3* 3.2* 3.2*   < > = values in this interval not displayed.   No results for input(s): LIPASE, AMYLASE in the last 168 hours. No results for input(s): AMMONIA in the last 168 hours.  ABG    Component Value Date/Time   PHART 7.392 07/24/2018 0722   PCO2ART 34.3 07/24/2018 0722   PO2ART 94.7 07/24/2018 0722   HCO3 23.2 07/29/2020 1154   HCO3 22.5 07/29/2020 1154   TCO2 16 (L) 04/02/2021 1929   ACIDBASEDEF 3.0 (H) 07/29/2020 1154   ACIDBASEDEF 4.0 (H) 07/29/2020 1154   O2SAT 72.0 04/10/2021 0333     Coagulation Profile: Recent Labs  Lab 04/06/21 0130  INR 1.4*    Cardiac Enzymes: No results for input(s): CKTOTAL, CKMB, CKMBINDEX, TROPONINI in the last 168 hours.  HbA1C: Hgb A1c MFr Bld  Date/Time Value Ref Range Status  04/05/2021 12:19 AM 5.5 4.8 - 5.6 % Final    Comment:    (NOTE) Pre diabetes:          5.7%-6.4%  Diabetes:              >6.4%  Glycemic control for   <7.0% adults with diabetes   06/14/2020 02:02 PM 6.8 (H) 4.6 - 6.5 % Final    Comment:    Glycemic Control Guidelines for People with Diabetes:Non Diabetic:  <6%Goal of Therapy: <7%Additional Action Suggested:  >8%     CBG: Recent Labs  Lab 04/09/21 1511 04/09/21 1920 04/09/21 2327 04/10/21 0324 04/10/21 0718  GLUCAP 279* 186* 155* 157* 169*   Critical care time: 35 minutes     Roselie Awkward,  MD [place] [facility] Pager: [phone] Cell: [phone] After 7:00 pm call Elink  [phone]

## 2021-04-10 NOTE — Progress Notes (Signed)
[facility] Progress Note   Assessment/Plan **AKI on CKD:  Baseline Cr 1.5-2 CKD 3 now with severe AKI in setting of shock.  Suspect this is hemodynamically mediated AKI c/w cardiorenal.  Due to worsening azotemia proceeded 10/4 to CRRT.  The hope is that with improvements in hemodynamics his AKI would be improve though  unfortunately he's developing oliguria.  Long term dialysis is certainly going to be a challenge unless his clinical status can improve significantly - discussed with him this week.  Current plan is to reach euvolemia with CVP 10 then hold CRRT and see how he does clinically; currently CVP 14.  If he cannot come off of inotrope and remains dialysis dependent plan would be to recommend hospice.  Pall care invovled.  **Syncope/shock:  vol depletion + cardiogenic in origin.  ICD interrogation wth no VT.  Midodrine started in attempt to help liberate from vasopressor/inotropic support on which he remains dependent.   **HFrEF:  has chronic biV failure - component of cardiogenic shock and now on milrinone with improved hemodynamics. Heart failure consulting.  Working towards euvolemia with CVP goal 10 using CRRT for UF currently.  Per notes not an LVAD or transplant candidate.   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   **Anemia:  s/p transfusion 10/6  **Thrombocytopenia: no heparin with CRRT o/w per primary  Will follow, call with concerns.    Jannifer Hick MD 04/05/2021, 1:32 PM  [facility] Pager: [phone] ------------------------------------------------------------------------------------------------- Subjective:   Feeling ok this AM; considering code status.     Remains on NE low dose,  milrinone 0.25, vaso 0.03; midodrine.  I/Os 1200/ 3400, UOP 100, net neg 5422m for admission per I/Os.   Objective Vitals:   04/10/21 0500 04/10/21 0600 04/10/21 0700 04/10/21 0715  BP:      Pulse: 96  99 94 79  Resp: 18 19 (!) 25 14  Temp:      TempSrc:      SpO2: 99% 100% 100% 100%  Weight:      Height:       Physical Exam General: chronically ill appearing lying at 45 degrees in no distress Heart: RRR, no rub Lungs: clear ant  Abdomen: soft, nontender Extremitiesimproving trace diffuse edema Dialysis Access: LIJ temp HD cath.   Additional Objective Labs: Basic Metabolic Panel: Recent Labs  Lab 04/09/21 0327 04/09/21 1626 04/10/21 0333  NA 136 135 135  K 4.0 4.1 4.3  CL 101 103 102  CO2 _0 GLUCOSE 167* 254* 150*  BUN _1 CREATININE 1.33* 1.25* 1.15  CALCIUM 8.2* 8.0* 8.2*  PHOS 2.1* 2.2* 1.6*    Liver Function Tests: Recent Labs  Lab 04/13/2021 2022 04/05/21 0019 04/06/21 0130 04/06/21 0500 04/09/21 0327 04/09/21 1626 04/10/21 0333  AST 22  --  33  --   --   --   --   ALT 12  --  22  --   --   --   --   ALKPHOS 38  --  61  --   --   --   --   BILITOT 0.4  --  0.9  --   --   --   --   PROT 3.3*  --  5.4*  --   --   --   --   ALBUMIN 1.7*   < > 2.7*   < > 3.3* 3.2* 3.2*   < > =  values in this interval not displayed.    No results for input(s): LIPASE, AMYLASE in the last 168 hours. CBC: Recent Labs  Lab 04/20/2021 1918 04/13/2021 1929 04/07/21 0313 04/07/21 1537 04/08/21 0413 04/09/21 0327 04/10/21 0333  WBC 6.5   < > 3.7* 4.4 3.8* 5.1 6.9  NEUTROABS 4.9  --   --   --   --   --   --   HGB 11.5*   < > 7.2* 8.0* 7.6* 7.7* 8.3*  HCT 36.6*   < > 21.7* 25.2* 23.4* 25.1* 26.1*  MCV 128.0*   < > 120.6* 113.5* 114.1* 117.8* 118.6*  PLT PLATELET CLUMPS NOTED ON SMEAR, UNABLE TO ESTIMATE   < > 70* 70* 59* 68* 75*   < > = values in this interval not displayed.    Blood Culture    Component Value Date/Time   SDES BLOOD LEFT ANTECUBITAL 04/07/2021 1946   SDES BLOOD RIGHT FOREARM 04/29/2021 1946   SPECREQUEST  04/26/2021 1946    BOTTLES DRAWN AEROBIC AND ANAEROBIC Blood Culture results may not be optimal due to an inadequate volume of blood  received in culture bottles   SPECREQUEST  04/11/2021 1946    BOTTLES DRAWN AEROBIC AND ANAEROBIC Blood Culture results may not be optimal due to an inadequate volume of blood received in culture bottles   CULT  04/27/2021 1946    NO GROWTH 5 DAYS Performed at [facility], [place] [address], [place], [place] [address]    CULT  05/02/2021 1946    NO GROWTH 5 DAYS Performed at [facility] [address] [place], [place] [address]    REPTSTATUS 04/09/2021 FINAL 04/26/2021 1946   REPTSTATUS 04/09/2021 FINAL 04/26/2021 1946    Cardiac Enzymes: No results for input(s): CKTOTAL, CKMB, CKMBINDEX, TROPONINI in the last 168 hours. CBG: Recent Labs  Lab 04/09/21 1511 04/09/21 1920 04/09/21 2327 04/10/21 0324 04/10/21 0718  GLUCAP 279* 186* 155* 157* 169*    Iron Studies:  Recent Labs    04/08/21 1418  FERRITIN 319    _0 @ Studies/Results: No results found. Medications:   prismasol BGK 4/2.5 400 mL/hr at 04/10/21 0400    prismasol BGK 4/2.5 300 mL/hr at 04/09/21 2335   sodium chloride     milrinone 0.25 mcg/kg/min (04/10/21 0700)   norepinephrine (LEVOPHED) Adult infusion 1.8 mcg/min (04/10/21 0700)   prismasol BGK 4/2.5 1,500 mL/hr at 04/10/21 0506   sodium phosphate  Dextrose 5% IVPB 44 mL/hr at 04/10/21 0700   vasopressin 0.03 Units/min (04/10/21 0700)    aspirin EC  81 mg Oral Daily   atorvastatin  40 mg Oral Daily   B-complex with vitamin C  1 tablet Oral Daily   Chlorhexidine Gluconate Cloth  6 each Topical Daily   feeding supplement  237 mL Oral TID BM   hydrocortisone sod succinate (SOLU-CORTEF) inj  100 mg Intravenous Q8H   influenza vac split quadrivalent PF  0.5 mL Intramuscular Tomorrow-1000   insulin aspart  0-9 Units Subcutaneous Q4H   mouth rinse  15 mL Mouth Rinse BID   midodrine  10 mg Oral TID with meals   mometasone-formoterol  2 puff Inhalation BID   pantoprazole (PROTONIX) IV  40 mg Intravenous Daily   phosphorus  500 mg Oral  BID   sodium chloride flush  10-40 mL Intracatheter Q12H   vitamin B-12  1,000 mcg Oral Daily

## 2021-04-10 NOTE — Consult Note (Signed)
Palliative Care Consult Note                                  Date: 04/10/2021   Patient Name: Vincent Chandler  DOB: 30-Dec-1960  MRN: [id number]  Age / Sex: 60 y.o., male  PCP: Ann Held, DO Referring Physician: Juanito Doom, MD  Reason for Consultation: Establishing goals of care  HPI/Patient Profile: 60 y.o. male  with past medical history of HFrEF s/p AICD, CAD s/p CABG x 4, intermittent AFib, HLD, OSA on CPAP, ILD on chronic steroids, CKD 3, T2DM admitted on 04/29/2021 with cc of syncopal episode, weakness, and dark stools. Work-up found acute on chronic CHF causing cardiogenic shock. He has developed cardiorenal syndrome and was started on CRRT for AKI and has required multiple vasopressors. Currently on midodrine, vasopressin, and levophed. Being followed by advanced heart failure team.   EF <20% this admission, cardiogenic shock. HF team notes not a LVAD or transplant candidate with significant ILD and renal failure. Made a DNR yesterday. Plan to attempt CVP down to 10 with CRRT then hold CRRT and if no improvement then hospice is only answer.  PCCM feels his heart is too weak to tolerate iHD and not a good candidate for long term HD regardless. Cariogenic shock on multiple pressors and ionotropes with no hope of improvement. They have explained to the patient he will not survive this illness and recommended making plans with PMT for death over the next few days.  Past Medical History:  Diagnosis Date   AICD (automatic cardioverter/defibrillator) present    Anginal pain ([place])    Anxiety    Asthma    Atrial fibrillation-postoperative 11/28/2012   Cardiomyopathy    alcohol use related   CHF (congestive heart failure) (HCC)    Chronic back pain    Coronary artery disease    drug eluting stent RCA 2005-EF 30%- s/p CABG x 4; 2/4 patent grafts with SVG-PLOM and SVG-RCA system totally occluded.  There are collaterals from  the LAD system to the RCA and the RCA is totally occluded proximally.  There are no collaterals to the LCx territory.  He then underwent successful PCI of the mid left circumflex artery with overlapping Synergy drug-eluting stents   Depression    pt denies   Diabetes mellitus type II dx'd in the 1990's   GERD (gastroesophageal reflux disease)    yrs ago   H/O hiatal hernia    Hemorrhoids    History of hypogonadism    History of kidney stones    Hyperlipidemia    Hypotension    OSA on CPAP    "mask is broken; working on getting a new one" (01/15/2015; 10/03/2017)   Pneumonia    "3-4 times" (10/03/2017)   Urinary incontinence     Subjective:   This NP Walden Field reviewed medical records, received report from team, assessed the patient and then meet at the patient's bedside to discuss diagnosis, prognosis, GOC, EOL wishes disposition and options.  I met with the patient and several family members at bedside. Those present were: sister Jocelyn Lamer, brother-in-law Marya Amsler, brother Barth Kirks, friend (of 21 years) Gretta Cool, [place] girlfriend Levada Dy.   Concept of Palliative Care was introduced as specialized medical care for people and their families living with serious illness.  If focuses on providing relief from the symptoms and stress of a serious illness.  The goal is to  improve quality of life for both the patient and the family. Values and goals of care important to patient and family were attempted to be elicited.  Created space and opportunity for patient  and family to explore thoughts and feelings regarding current medical situation   Natural trajectory and current clinical status were discussed. Questions and concerns addressed. Patient  encouraged to call with questions or concerns.    Patient/Family Understanding of Illness: We discussed the understanding of current and chronic illnesses with the patient's family.  I understand he does have heart failure and it is near the end of the road.  He has  kidney disease and to damage the kidneys at this point because of his heart failure.  He states, quite frankly, that "I am dying."  He states he has been told that there is no way to cure his current illness and he will likely die in the hospital.  He seems to accept this, although seems to be having some difficulty with it.  His family verbalizes understanding of everything and appreciates the explanation.  Today's Discussion: We had an extensive discussion on the differences between aggressive care and comfort care.  We discussed that his providers have recommended shift to comfort care given the incurable nature of his acute on chronic illnesses.  He asks a lot of questions about what this would look like.  His biggest concern seems to be that he will not be awake and able to interact with family and friends to come to visit.  We assured him that we can plan to keep DNR and not escalate care but maintain the current status for the next day or so to allow time for family and friends come visit with him.  He seems to be more reassured by this.  We discussed that when he has had time to visit with family and friends and is ready then we can start turning down his medications (vasopressors and inotropes) while starting comfort trips to prevent pain, dyspnea/air hunger, anxiety, agitation.  We discussed the planned peaceful nature of his passing and he seemed to be reassured by this.  Family seem to be reassured as well.  We did decide that he would like to stop CRRT.  I discussed that this would likely not have an impact on his health over the next few days.  I told him I would discuss this with his other providers.  I offered emotional and general support especially with therapeutic listening.  I answered all questions and addressed all concerns.  Review of Systems  Constitutional:  Positive for activity change and fatigue.  Respiratory:  Negative for shortness of breath.   Cardiovascular:  Negative for  chest pain.  Gastrointestinal:  Negative for abdominal pain, nausea and vomiting.   Objective:   Primary Diagnoses: Present on Admission:  Hyperkalemia   Physical Exam Vitals and nursing note reviewed.  Constitutional:      General: He is not in acute distress.    Appearance: He is normal weight. He is ill-appearing.  HENT:     Head: Normocephalic and atraumatic.  Pulmonary:     Effort: Pulmonary effort is normal. No respiratory distress.  Abdominal:     General: Abdomen is flat.     Palpations: Abdomen is soft.  Skin:    General: Skin is warm and dry.  Neurological:     General: No focal deficit present.     Mental Status: He is alert.    Vital Signs:  BP 116/80   Pulse (!) 109   Temp 97.6 F (36.4 C) (Axillary)   Resp 17   Ht _0  (1.753 m)   Wt 75 kg   SpO2 98%   BMI 24.42 kg/m   Palliative Assessment/Data: 30%    Advanced Care Planning:   Primary Decision Maker: PATIENT  Code Status/Advance Care Planning: DNR  A discussion was had today regarding advanced directives. Concepts specific to code status, artifical feeding and hydration, continued IV antibiotics and rehospitalization was had.  The difference between a aggressive medical intervention path and a palliative comfort care path for this patient at this time was had.   Decisions/Changes to ACP: None at this time Remain DNR  Assessment & Plan:   Impression: Very ill 60 year old male with end-stage heart failure, cardiorenal, acute kidney injury.  His acute on chronic health concerns relating to his hospitalization are incurable.  He is well aware of this.  Anticipate in-hospital death.  We will plan for transition to comfort care when he has had time to visit with family and friends.  SUMMARY OF RECOMMENDATIONS   Remain DNR No escalation of care Allow time for family and friends to visit (anticipate 24 to 48 hours) Will discuss stopping CRRT with advanced heart failure and PCCM After  adequate time for plan to transition to comfort care including withdrawing life support and drips and starting comfort drips We will provide for as needed medications for symptoms for now PMT will continue to follow  Symptom Management:  Dilaudid 0.5 mg IV every 2 hours as needed for pain or dyspnea Ativan every 4 hours as needed for anxiety Continue MiraLAX and Colace for constipation  Prognosis:  Hours - Days  Discharge Planning:  Anticipated Hospital Death   Discussed with: Medical team, nursing team, patient, family    Thank you for allowing [place] to participate in the care of AKIN YI PMT will continue to support holistically.  Time Total: 105 min  Greater than 50%  of this time was spent counseling and coordinating care related to the above assessment and plan.  Signed by: Walden Field, NP Palliative Medicine Team  Team Phone # [phone] (Nights/Weekends)  04/10/2021, 8:48 AM

## 2021-04-10 NOTE — Progress Notes (Signed)
[facility] ADULT ICU REPLACEMENT PROTOCOL   The patient does apply for the [facility] Adult ICU Electrolyte Replacment Protocol based on the criteria listed below:   1.Exclusion criteria: TCTS patients, ECMO patients and Hypothermia Protocol, and   Dialysis patients 2. Is GFR >/= 30 ml/min? Yes.    Patient's GFR today is >60 3. Is SCr </= 2? Yes.   Patient's SCr is 1.15 mg/dL 4. Did SCr increase >/= 0.5 in 24 hours? No. 5.Pt's weight >40kg  Yes.   6. Abnormal electrolyte(s): Phos 1.6  7. Electrolytes replaced per protocol 8.  Call MD STAT for K+ </= 2.5, Phos </= 1, or Mag </= 1 Physician:  Bonne Dolores, Lafreda Casebeer A 04/10/2021 4:38 AM

## 2021-04-11 DIAGNOSIS — N178 Other acute kidney failure: Secondary | ICD-10-CM | POA: Diagnosis not present

## 2021-04-11 DIAGNOSIS — Z7189 Other specified counseling: Secondary | ICD-10-CM | POA: Diagnosis not present

## 2021-04-11 DIAGNOSIS — N179 Acute kidney failure, unspecified: Secondary | ICD-10-CM | POA: Diagnosis not present

## 2021-04-11 DIAGNOSIS — Z515 Encounter for palliative care: Secondary | ICD-10-CM | POA: Diagnosis not present

## 2021-04-11 DIAGNOSIS — F419 Anxiety disorder, unspecified: Secondary | ICD-10-CM

## 2021-04-11 DIAGNOSIS — I5041 Acute combined systolic (congestive) and diastolic (congestive) heart failure: Secondary | ICD-10-CM

## 2021-04-11 DIAGNOSIS — I5023 Acute on chronic systolic (congestive) heart failure: Secondary | ICD-10-CM | POA: Diagnosis not present

## 2021-04-11 DIAGNOSIS — I959 Hypotension, unspecified: Secondary | ICD-10-CM | POA: Diagnosis not present

## 2021-04-11 LAB — POCT I-STAT, CHEM 8
BUN: >130 mg/dL — ABNORMAL HIGH (ref 6–20)
Calcium, Ion: 0.86 mmol/L — CL (ref 1.15–1.40)
Chloride: 105 mmol/L (ref 98–111)
Creatinine, Ser: 5.7 mg/dL — ABNORMAL HIGH (ref 0.61–1.24)
Glucose, Bld: 101 mg/dL — ABNORMAL HIGH (ref 70–99)
HCT: 37 % — ABNORMAL LOW (ref 39.0–52.0)
Hemoglobin: 12.6 g/dL — ABNORMAL LOW (ref 13.0–17.0)
Potassium: 8.5 mmol/L (ref 3.5–5.1)
Sodium: 127 mmol/L — ABNORMAL LOW (ref 135–145)
TCO2: 16 mmol/L — ABNORMAL LOW (ref 22–32)

## 2021-04-11 MED ORDER — TRAZODONE HCL 50 MG PO TABS
50.0000 mg | ORAL_TABLET | Freq: Every evening | ORAL | Status: DC | PRN
  Administered 2021-04-12: 50 mg via ORAL
  Filled 2021-04-11 (×2): qty 1

## 2021-04-11 MED ORDER — LORAZEPAM 2 MG/ML PO CONC: 0.5000 mg | ORAL | Status: DC | PRN

## 2021-04-11 MED ORDER — LORAZEPAM 0.5 MG PO TABS
0.5000 mg | ORAL_TABLET | ORAL | Status: DC | PRN
  Administered 2021-04-12 – 2021-04-13 (×2): 0.5 mg via ORAL
  Filled 2021-04-11 (×2): qty 1

## 2021-04-11 MED ORDER — LORAZEPAM 2 MG/ML IJ SOLN
0.5000 mg | INTRAMUSCULAR | Status: DC | PRN
  Administered 2021-04-13 – 2021-04-14 (×2): 0.5 mg via INTRAVENOUS
  Filled 2021-04-11 (×2): qty 1

## 2021-04-11 MED ORDER — ALPRAZOLAM 0.25 MG PO TABS
0.2500 mg | ORAL_TABLET | Freq: Three times a day (TID) | ORAL | Status: DC | PRN
  Administered 2021-04-11: 0.25 mg via ORAL
  Filled 2021-04-11: qty 1

## 2021-04-11 NOTE — Progress Notes (Signed)
This chaplain responded to PMT referral for spiritual care. The chaplain was updated by the Pt. RN-Felicia. Family is visiting at the time the chaplain introduced herself.  The chaplain understands the Pt. prefers a visit when family is not present.  This chaplain will attempt a spiritual care visit at another time.

## 2021-04-11 NOTE — Progress Notes (Signed)
NAME:  Vincent Chandler, MRN:  [id number], DOB:  August 09, 1960, LOS: 35 ADMISSION DATE:  04/27/2021, CONSULTATION DATE:  10/3 REFERRING MD:  EDP, CHIEF COMPLAINT:  Syncope   History of Present Illness:  60 y/o male with multiple cardiac problems presented with acute on chronic systolic heart failure causing cardiogenic shock. Since admission he has started on CRRT for AKI and has required multiple vasopressors.  Pertinent  Medical History  HFrEF (EF of 25-30%) s/p AICD  CAD s/p CABG x4 Intermittent Afib  HLD OSA on CPAP ILD on chronic steroid > UIP pattern T2DM CKD Stage 3  Significant Hospital Events: Including procedures, antibiotic start and stop dates in addition to other pertinent events   10/03 - consulted for hyperkalemia and hypotension and admitted to [facility]. 10/3 CT HEAD > NAICP, generalized atrophy, paranasal sinus disease. Rocephin started empirically 10/3 CT c-spine > no bony abnormality, multi-level degenerative changes 10/04 -advanced heart failure team consulted, central line placed left IJ and left more, right femoral A-line placed 10/04 - tried to initiate CRRT without success due to HD line problems.  10/05 - HD cath inserted in Rt IJ and resumed CRRT, 10/5 Echo > LVEF < 20% global hypokinesis, RV systolic function moderately reduced1 10/06 - started midodrine and weaned off levo. 0/6 Upper ext vasc ultrasound bilaterally > no DVT 10/07 - increased midodrine and remains on levo, milrinone, and vaso. Rocephin stopped. All cultures neg.  10/8 - met with HF team, elected to change code status to DNR 10/9 met w/ palliative. CRRT stopped. Plan to wait for arrival of family and then dc inotropic and vasopressor support.  10/10 not much relief w/ ativan still very anxious    Interim History / Subjective:  Feels lots of anxiety. LEs more swollen   Objective   Blood pressure 100/87, pulse 93, temperature 98.3 F (36.8 C), temperature source Oral, resp. rate 18, height 5' 9"  (1.753 m), weight 75.3 kg, SpO2 98 %. CVP:  [13 mmHg-25 mmHg] 24 mmHg      Intake/Output Summary (Last 24 hours) at 04/11/2021 0727 Last data filed at 04/11/2021 0600 Gross per 24 hour  Intake 1235.85 ml  Output 1100 ml  Net 135.85 ml   Filed Weights   04/09/21 0342 04/10/21 0136 04/11/21 0427  Weight: 75.8 kg 75 kg 75.3 kg    Examination: General: This is a 60 year old male patient he is chronically ill, currently in no acute distress resting comfortably but leg swelling a little worse HEENT normocephalic atraumatic no jugular venous distention appreciated Pulmonary: Diminished bases currently no accessory use.  Current oxygen requirements at 4 L Card RRR w/ systolic HM Abd soft not tender Ext Increased pitting edema pulse palp.  GU min UOP Neuro intact but anxious  Resolved Hospital Problem list   GI bleed/melena> resolved Hyperkalemia  Assessment & Plan:  Cardiogenic shock  Acute on chronic systolic heart failure, ischemic cardiomyopathy plan Cont current inotropic and vasoactive gtts But will be full comfort and stopping these soon Elevate legs for comfort  NSTEMI CAD s/p CABG x4 Plan Tele   AKI (acute on chronic (CKD stage III)> Cardiorenal syndrome CKD 3a Plan Off CRRT No more labs  Macrocytic anemia Thrombocytopenia Plan Scds Mon for evidence of bleeding Given transition to comfort will limit lab draws   ILD> UIP pattern on antifibrotics (pirfenidone, prednisone) Plan Cont hydrocortisone Oxygen for sats > 88%  DM2 Plan Carb mod diet as tolerated   Comfort care Met w/ palliative Plan PRN  analgesia/ narcotics for pain and dyspnea Supplemental oxygen PRN benzos for anxiety Open up visiting hours Plan to stop all vasoactive and inotropic gtts when pt is ready   Best Practice (right click and "Reselect all SmartList Selections" daily)   Diet/type: Regular consistency (see orders) DVT prophylaxis: SCD GI prophylaxis: N/A Lines: Dialysis  Catheter and yes and it is still needed Foley:  N/A Code Status:  DNR Last date of multidisciplinary goals of care discussion [10/9, palliative medicine to see him today]   Critical care time: NA    Erick Colace ACNP-BC [place] Pager # [phone] OR # [phone] if no answer

## 2021-04-11 NOTE — Progress Notes (Addendum)
Advanced Heart Failure Rounding Note  PCP-Cardiologist: None   Subjective:    Admitted 10/03 with a/c biventricular HF/cardiogenic shock, AKI on CKD. Initiated on CVVHD and on milrionone, NE and vasopressin.  Met with palliative care on 10/09 and decided on comfort care. CVVHD stopped. Drips continued until family arrives.  In good spirits this am. Waiting for more family to arrive.    Objective:   Weight Range: 75.3 kg Body mass index is 24.51 kg/m.   Vital Signs:   Temp:  [97.7 F (36.5 C)-98.7 F (37.1 C)] 98.3 F (36.8 C) (10/10 0400) Pulse Rate:  [89-126] 114 (10/10 1045) Resp:  [15-27] 27 (10/10 1045) BP: (94-120)/(66-89) 94/66 (10/10 0800) SpO2:  [90 %-100 %] 92 % (10/10 1045) Arterial Line BP: (83-137)/(47-86) 126/67 (10/10 1045) Weight:  [75.3 kg] 75.3 kg (10/10 0427) Last BM Date: 04/11/21  Weight change: Filed Weights   04/09/21 0342 04/10/21 0136 04/11/21 0427  Weight: 75.8 kg 75 kg 75.3 kg    Intake/Output:   Intake/Output Summary (Last 24 hours) at 04/11/2021 1133 Last data filed at 04/11/2021 1100 Gross per 24 hour  Intake 914.09 ml  Output 454 ml  Net 460.09 ml      Physical Exam   General:  Thin. Fatigued appearing. Comfortable.  HEENT: normal. + ecchymoses on forehead Neck: supple. CCHD cath out. + JVD. Carotids 2+ bilat; no bruits. No lymphadenopathy or thryomegaly appreciated. Cor: PMI nondisplaced. Irregular rhythm. Tachy. No rubs, gallops or murmurs. Lungs: clear anteriorly Abdomen: soft, nontender, nondistended. No hepatosplenomegaly. No bruits or masses. Good bowel sounds. Extremities: no cyanosis, clubbing, rash, trace edema, multiple ecchymoses Neuro: alert & orientedx3, cranial nerves grossly intact. moves all 4 extremities w/o difficulty. Affect pleasant    Telemetry   AF 90s-100s (personally reviewed)   Labs    CBC Recent Labs    04/09/21 0327 04/10/21 0333  WBC 5.1 6.9  HGB 7.7* 8.3*  HCT 25.1* 26.1*  MCV  117.8* 118.6*  PLT 68* 75*   Basic Metabolic Panel Recent Labs    04/09/21 0327 04/09/21 1626 04/10/21 0333  NA 136 135 135  K 4.0 4.1 4.3  CL 101 103 102  CO2 _0 GLUCOSE 167* 254* 150*  BUN _1 CREATININE 1.33* 1.25* 1.15  CALCIUM 8.2* 8.0* 8.2*  MG 2.5*  --  2.6*  PHOS 2.1* 2.2* 1.6*   Liver Function Tests Recent Labs    04/09/21 1626 04/10/21 0333  ALBUMIN 3.2* 3.2*   No results for input(s): LIPASE, AMYLASE in the last 72 hours. Cardiac Enzymes No results for input(s): CKTOTAL, CKMB, CKMBINDEX, TROPONINI in the last 72 hours.  BNP: BNP (last 3 results) Recent Labs    12/14/20 1255 02/15/21 1244 03/16/21 1422  BNP 768.9* 923.6* 1,237.2*    ProBNP (last 3 results) No results for input(s): PROBNP in the last 8760 hours.   D-Dimer No results for input(s): DDIMER in the last 72 hours. Hemoglobin A1C No results for input(s): HGBA1C in the last 72 hours.  Fasting Lipid Panel No results for input(s): CHOL, HDL, LDLCALC, TRIG, CHOLHDL, LDLDIRECT in the last 72 hours. Thyroid Function Tests Recent Labs    04/09/21 0700  TSH 1.256    Other results:   Imaging    No results found.   Medications:     Scheduled Medications:  mouth rinse  15 mL Mouth Rinse BID   midodrine  10 mg Oral TID with meals   mometasone-formoterol  2 puff Inhalation BID   pantoprazole (PROTONIX) IV  40 mg Intravenous Daily   sodium chloride flush  10-40 mL Intracatheter Q12H    Infusions:  sodium chloride     milrinone 0.25 mcg/kg/min (04/11/21 1100)   norepinephrine (LEVOPHED) Adult infusion 2 mcg/min (04/11/21 1100)   vasopressin 0.03 Units/min (04/11/21 1100)    PRN Medications: acetaminophen **OR** acetaminophen, albuterol, ALPRAZolam, antiseptic oral rinse, docusate sodium, fentaNYL (SUBLIMAZE) injection, haloperidol **OR** haloperidol **OR** haloperidol lactate, heparin, HYDROmorphone (DILAUDID) injection, lip balm, LORazepam **OR** LORazepam **OR**  LORazepam, ondansetron **OR** ondansetron (ZOFRAN) IV, polyethylene glycol, polyvinyl alcohol, sodium chloride flush    Patient Profile   Mr. Mcbain is a 60 yo male with history of chronic systolic HF, CAD s/p CABG AFL s/p ablation and atrial fib, hx VT, ILD d/t amio toxicity and stage III CKD. Admitted yesterday with syncope and a/c biventricular failure/cardiogenic shock.  Assessment/Plan    1. Acute on chronic systolic CHF: Ischemic cardiomyopathy. Med[employer] ICD single chamber. 2/19 admission for decompensated HF requiring milrinone. Echo 2/7/019 EF 15% Mildly dilated RV. CPX in 2/19 was suggestive of severe limitation due to HF.   Has IVCD, not LBBB-like => saw Dr. Caryl Comes, will not upgrade ICD to CRT. Echo 3/21 with EF 20-25%.  Echo this admission with EF < 20%, moderate RV dysfunction.  Admitted with hypotension, AKI and concern for cardiogenic shock.   - Remains on CVVH pulling - 100cc/hr net negative UF.  - CO-OX 72%. on milrinone 0.25, NE 2 mcg, vasopressin 0.03 units.  - Continue midodrine 10 mg tid.  - With significant ILD and now renal failure, not an LVAD or transplant candidate.   -  Code status changed to DNR/DNI yesterday - Long discussion with Palliative Care and transitioning transition to comfort care. Will keep drips going for now until family has time to visit.  - CVVHD ant trialysis catheter out 2. VT: 01/13/21. Asymptomatic. 1 ATP -> 1 shock that was successful. Device interrogated this admission, no VT.  3. Afib/ Atrial flutter: Admitted in 2/19 with severe HF decompensation in setting of atrial flutter with RVR.  He was cardioverted.  He then had atrial flutter ablation in 3/19. He is off amiodarone due to question of amiodarone lung toxicity. No palpitations.  He is back in atrial fibrillation with mild RVR this admission.  - remains in A fib. Rate mildly elevated today - Not candidate for amiodarone with concern for prior lung toxicity.    - Anticoagulation on hold for  now with drop in hgb and +FOBT.  4. CAD: S/p CABG.  Last intervention was PCI to LCx in 2016. Repeat cath (1/22) with occluded SVG-OM and SVG-RCA with patent LIMA-LAD and SVG-D, occluded proximal RCA, and newly occluded mid LCX. No targets for revascularization.  No chest pain but HS-TnI elevated to 3988 this admission.  Not felt to be true ACS, suspect demand ischemia with profound shock.  - No s/s angina - Continue atorvastatin.  - ASA 81 for now.  5. Pulmonary fibrosis: PFTs in 2/19 were restrictive, and high resolution CT showed interstitial lung disease. Amiodarone was stopped due to concern for possible toxicity.  He has seen [name] => bronchoscopy suggestive of ILD related to amiodarone.  He has been on chronic prednisone.  High resolution CT chest repeated in 6/20, again showing ILD, described at UIP pattern. He has started Ofev. I think that a significant amount of his symptomatology is due to pulmonary fibrosis. Baseline on home oxygen.  6.  OSA: Uses CPAP 7. AKI on CKD stage 3: Possible ATN due to hypotension (sound like BP may have been low for a couple of weeks prior to admission based on symptoms). Still making some urine - Now off CVVHD as above 8. Pulmonary hypertension: Suspect group 3 PH due to lung disease (ILD). RHC (1/22) with moderate pulmonary arterial hypertension, PVR 4.2 WU. - He did not tolerate Tyvaso for PH-ILD. 9. DM2: He did not tolerate [place].  10. Thrombocytopenia: Suspect related to critical illness,trending down 59>70 > 75K today.  He is not currently anticoagulated.  11. ID: Afebrile, he is on empiric ceftriaxone but no definite sign of infection.  12. GI bleeding: FOBT+ with hgb falling - Anticoagulation on hold for now.  - PPI.  - Received 1UPRBC 10/6 hemoglobin 7.2>7.6. hgb 8.3 today  13. RUE swelling: [place] negative for DVT.   Appreciate Palliative Care input. Agree with plan to switch to Comfort Care. Off CVVHD. Continue drips until family has time to  visit with him.    Length of Stay: 7  [facility], [place], PA-C  2021/05/02, 11:33 AM  Advanced Heart Failure Team Pager [phone] (M-F; 7a - 5p)  Please contact [facility] for night-coverage after hours (5p -7a ) and weekends on [email or web address]   Patient seen with PA, agree with the above note.    Plan now for comfort care.  He remains on pressors as he awaits family visits.  CVVH off.  Creatinine, BUN, and K all high on last labs and not checking anymore at this point.  Anticipate in-hospital death.   Loralie Champagne 05-02-2021 5:29 PM

## 2021-04-11 NOTE — Progress Notes (Signed)
Daily Progress Note   Patient Name: Vincent Chandler       Date: 04/11/2021 DOB: [birth year]  Age: 60 y.o. MRN#: [id number] Attending Physician: Vincent Doom, MD Primary Care Physician: Vincent Chandler, Vincent Apa, DO Admit Date: 04/17/2021 Length of Stay: 7 days  Reason for Consultation/Follow-up: Establishing goals of care  HPI/Patient Profile:  60 y.o. male  with past medical history of HFrEF s/p AICD, CAD s/p CABG x 4, intermittent AFib, HLD, OSA on CPAP, ILD on chronic steroids, CKD 3, T2DM admitted on 04/23/2021 with cc of syncopal episode, weakness, and dark stools. Work-up found acute on chronic CHF causing cardiogenic shock. He has developed cardiorenal syndrome and was started on CRRT for AKI and has required multiple vasopressors. Currently on midodrine, vasopressin, and levophed. Being followed by advanced heart failure team.    EF <20% this admission, cardiogenic shock. HF team notes not a LVAD or transplant candidate with significant ILD and renal failure. Made a DNR yesterday. Plan to attempt CVP down to 10 with CRRT then hold CRRT and if no improvement then hospice is only answer.   PCCM feels his heart is too weak to tolerate iHD and not a good candidate for long term HD regardless. Cariogenic shock on multiple pressors and ionotropes with no hope of improvement. They have explained to the patient he will not survive this illness and recommended making plans with PMT for death over the next few days.  Subjective:   Subjective: Chart Reviewed. Updates received. Patient Assessed. Created space and opportunity for patient  and family to explore thoughts and feelings regarding current medical situation.  Today's Discussion: Today when I entered the room the patient was resting comfortably.  I asked if he had not been able to see more family and friends and he said he was and he was enjoying this.  I asked if he was having any discomfort such as pain, breathing problems, nausea, vomiting  and he denied these.  I offered my support as we continue to deal with his significant health problems.  He did ask me to remove a wedge pillow from underneath him as it was getting uncomfortable which I did.  Discussed the case/situation with nursing and agreed that it would likely be within about 24 hours that we would initiate transition to comfort care.  I asked her to notify PMT if he indicates that he is ready at any point.  Review of Systems  Constitutional:  Positive for fatigue.  Respiratory:  Negative for chest tightness and shortness of breath.   Cardiovascular:  Negative for chest pain.  Gastrointestinal:  Negative for abdominal pain, nausea and vomiting.   Objective:   Vital Signs:  BP 94/66   Pulse (!) 121   Temp 98.3 F (36.8 C) (Oral)   Resp (!) 24   Ht 5' 9" (1.753 m)   Wt 75.3 kg   SpO2 93%   BMI 24.51 kg/m   Physical Exam: Physical Exam Vitals and nursing note reviewed.  Constitutional:      General: He is sleeping. He is not in acute distress.    Appearance: He is normal weight. He is ill-appearing.  HENT:     Head: Normocephalic and atraumatic.  Pulmonary:     Effort: Pulmonary effort is normal. No respiratory distress.     Breath sounds: No wheezing or rhonchi.  Abdominal:     General: Abdomen is flat.     Palpations: Abdomen is soft.  Skin:  General: Skin is warm and dry.  Neurological:     Mental Status: He is easily aroused.  Psychiatric:     Comments: Seems somewhat withdrawn    Palliative Assessment/Data: 30%   Assessment & Plan:   Impression: Present on Admission:  Hyperkalemia  Very ill 60 year old male with end-stage heart failure, cardiorenal, acute kidney injury.  His acute on chronic health concerns relating to his hospitalization are incurable.  He is well aware of this.  Anticipate in-hospital death.  We will plan for transition to comfort care when he has had time to visit with family and friends.  SUMMARY OF RECOMMENDATIONS    Remain DNR No escalation of care Allow time for family and friends to visit After adequate time for plan to transition to comfort care including withdrawing life support and drips and starting comfort drips PMT will continue to follow  Code Status: DNR  Prognosis: Hours - Days  Discharge Planning: Anticipated Hospital Death  Discussed with: Medical team, nursing team, patient  Thank you for allowing [place] to participate in the care of Vincent Chandler PMT will continue to support holistically.  Time Total: 35 min  Visit consisted of counseling and education dealing with the complex and emotionally intense issues of symptom management and palliative care in the setting of serious and potentially life-threatening illness. Greater than 50%  of this time was spent counseling and coordinating care related to the above assessment and plan.  Vincent Field, NP Palliative Medicine Team  Team Phone # [phone] (Nights/Weekends)  03/01/2021, 8:17 AM

## 2021-04-11 NOTE — Progress Notes (Signed)
Nutrition Brief Note  Chart reviewed. Pt transitioning to comfort care. Plan is to continue drips until family has time to visit with him. No further nutrition interventions planned at this time.  Please re-consult as needed.    Gustavus Bryant, MS, RD, LDN Inpatient Clinical Dietitian Please see [facility] for contact information.

## 2021-04-11 NOTE — Progress Notes (Signed)
Patient ID: Vincent Chandler, male   DOB: 02/09/1961, 60 y.o.   MRN: [id number] Patient with ischemic cardiomyopathy and admitted with acute on chronic systolic heart failure/cardiogenic shock.  Poorly tolerant to volume unloading with CRRT and yesterday transitioned to comfort care measures after discontinuing CRRT.  Nephrology service will sign off and remain available for questions or concerns.  Elmarie Shiley MD [facility]. Office # [phone] Pager # [phone] 8:32 AM

## 2021-04-12 DIAGNOSIS — R570 Cardiogenic shock: Secondary | ICD-10-CM | POA: Diagnosis not present

## 2021-04-12 DIAGNOSIS — Z515 Encounter for palliative care: Secondary | ICD-10-CM | POA: Diagnosis not present

## 2021-04-12 DIAGNOSIS — N179 Acute kidney failure, unspecified: Secondary | ICD-10-CM | POA: Diagnosis not present

## 2021-04-12 DIAGNOSIS — I959 Hypotension, unspecified: Secondary | ICD-10-CM | POA: Diagnosis not present

## 2021-04-12 DIAGNOSIS — Z7189 Other specified counseling: Secondary | ICD-10-CM | POA: Diagnosis not present

## 2021-04-12 LAB — PATHOLOGIST SMEAR REVIEW

## 2021-04-12 LAB — METHYLMALONIC ACID, SERUM: Methylmalonic Acid, Quantitative: 401 nmol/L — ABNORMAL HIGH (ref 0–378)

## 2021-04-12 MED ORDER — PANTOPRAZOLE SODIUM 40 MG PO TBEC
40.0000 mg | DELAYED_RELEASE_TABLET | Freq: Every day | ORAL | Status: DC
  Administered 2021-04-13: 40 mg via ORAL
  Filled 2021-04-12: qty 1

## 2021-04-12 NOTE — Progress Notes (Signed)
NAME:  Vincent Chandler, MRN:  [id number], DOB:  02/07/1961, LOS: 33 ADMISSION DATE:  04/10/2021, CONSULTATION DATE:  10/3 REFERRING MD:  EDP, CHIEF COMPLAINT:  Syncope   History of Present Illness:  60 y/o male with multiple cardiac problems presented with acute on chronic systolic heart failure causing cardiogenic shock. Since admission he has started on CRRT for AKI and has required multiple vasopressors.  Pertinent  Medical History  HFrEF (EF of 25-30%) s/p AICD  CAD s/p CABG x4 Intermittent Afib  HLD OSA on CPAP ILD on chronic steroid > UIP pattern T2DM CKD Stage 3  Significant Hospital Events: Including procedures, antibiotic start and stop dates in addition to other pertinent events   10/03 - consulted for hyperkalemia and hypotension and admitted to [facility]. 10/3 CT HEAD > NAICP, generalized atrophy, paranasal sinus disease. Rocephin started empirically 10/3 CT c-spine > no bony abnormality, multi-level degenerative changes 10/04 -advanced heart failure team consulted, central line placed left IJ and left more, right femoral A-line placed 10/04 - tried to initiate CRRT without success due to HD line problems.  10/05 - HD cath inserted in Rt IJ and resumed CRRT, 10/5 Echo > LVEF < 20% global hypokinesis, RV systolic function moderately reduced1 10/06 - started midodrine and weaned off levo. 0/6 Upper ext vasc ultrasound bilaterally > no DVT 10/07 - increased midodrine and remains on levo, milrinone, and vaso. Rocephin stopped. All cultures neg.  10/8 - met with HF team, elected to change code status to DNR 10/9 met w/ palliative. CRRT stopped. Plan to wait for arrival of family and then dc inotropic and vasopressor support.  10/10 not much relief w/ ativan still very anxious    Interim History / Subjective:  Patient resting in bed and eating breakfast.   Objective   Blood pressure (!) 107/92, pulse 96, temperature 97.7 F (36.5 C), temperature source Oral, resp. rate 20, height  5' 9" (1.753 m), weight 75.3 kg, SpO2 96 %.        Intake/Output Summary (Last 24 hours) at 04/12/2021 0539 Last data filed at 04/12/2021 0400 Gross per 24 hour  Intake 366.14 ml  Output --  Net 366.14 ml   Filed Weights   04/09/21 0342 04/10/21 0136 04/11/21 0427  Weight: 75.8 kg 75 kg 75.3 kg    Examination: General: This is a 60 year old male patient he is chronically ill, currently in no acute distress resting comfortably but leg swelling a little worse HEENT normocephalic atraumatic no jugular venous distention appreciated Pulmonary: Diminished bases currently no accessory use.  Current oxygen requirements at 4 L Card RRR w/ systolic HM Abd soft not tender Ext Increased pitting edema pulse palp.  GU min UOP Neuro intact but anxious  Resolved Hospital Problem list   GI bleed/melena> resolved Hyperkalemia  Assessment & Plan:  Cardiogenic shock  Acute on chronic systolic heart failure, ischemic cardiomyopathy Continue current plan with inotropes and vasoactive medications gtt. but otherwise full comfort measures. -Comfort measures  NSTEMI CAD s/p CABG x4 -Comfort care -Consider discontinuing telemetry and q shift vitals  AKI (acute on chronic (CKD stage III)> Cardiorenal syndrome CKD 3a -CRRT discontinued  Macrocytic anemia Thrombocytopenia -Remains on SCDs -Monitor for bleeding   ILD> UIP pattern on antifibrotics (pirfenidone, prednisone) Plan Cont hydrocortisone Oxygen for sats > 88%  DM2 Plan Carb mod diet as tolerated   Comfort care Met w/ palliative Plan PRN analgesia/ narcotics for pain and dyspnea Supplemental oxygen PRN benzos for anxiety Open up visiting hours  Plan to stop all vasoactive and inotropic gtts when pt is ready   Best Practice (right click and "Reselect all SmartList Selections" daily)   Diet/type: Regular consistency (see orders) DVT prophylaxis: SCD GI prophylaxis: N/A Lines: Dialysis Catheter and yes and it is still  needed Foley:  N/A Code Status:  DNR Last date of multidisciplinary goals of care discussion [10/9, palliative medicine to see him today]   Critical care time: NA    Lawerance Cruel, D.O.  Internal Medicine Resident, PGY-3 [facility]  Pager: [phone] 5:57 AM, 04/12/2021

## 2021-04-12 NOTE — Progress Notes (Addendum)
Advanced Heart Failure Rounding Note  PCP-Cardiologist: None   Subjective:    Admitted 10/03 with a/c biventricular HF/cardiogenic shock, AKI on CKD. Initiated on CVVHD and on milrionone, NE and vasopressin.  10/9 Comfort care. CVVHD stopped.   Remains on milrinone, norepi, and vasopressin until family arrives.   Having a hard time sleeping. Denies pain.   Objective:   Weight Range: 75.3 kg Body mass index is 24.51 kg/m.   Vital Signs:   Temp:  [97.7 F (36.5 C)-98.4 F (36.9 C)] 98.4 F (36.9 C) (10/11 0717) Pulse Rate:  [84-135] 91 (10/11 0750) Resp:  [5-31] 17 (10/11 0750) BP: (94-127)/(69-92) 127/86 (10/11 0400) SpO2:  [82 %-100 %] 91 % (10/11 0750) Arterial Line BP: (83-151)/(47-87) 122/70 (10/11 0750) Weight:  [75.3 kg] 75.3 kg (10/11 0500) Last BM Date: 04/11/21  Weight change: Filed Weights   04/10/21 0136 04/11/21 0427 04/12/21 0500  Weight: 75 kg 75.3 kg 75.3 kg    Intake/Output:   Intake/Output Summary (Last 24 hours) at 04/12/2021 0850 Last data filed at 04/12/2021 0700 Gross per 24 hour  Intake 368.5 ml  Output --  Net 368.5 ml      Physical Exam  General:  .No resp difficulty HEENT: normal Neck: supple. JVP ~10 . Carotids 2+ bilat; no bruits. No lymphadenopathy or thryomegaly appreciated. Cor: PMI nondisplaced. Irregular rate & rhythm. No rubs, gallops or murmurs. Lungs: clear Abdomen: soft, nontender, nondistended. No hepatosplenomegaly. No bruits or masses. Good bowel sounds. Extremities: no cyanosis, clubbing, rash, edema Neuro: alert & orientedx3, cranial nerves grossly intact. moves all 4 extremities w/o difficulty. Affect flat     Telemetry   A fib 90s personally reviewed.   Labs    CBC Recent Labs    04/10/21 0333  WBC 6.9  HGB 8.3*  HCT 26.1*  MCV 118.6*  PLT 75*   Basic Metabolic Panel Recent Labs    04/09/21 1626 04/10/21 0333  NA 135 135  K 4.1 4.3  CL 103 102  CO2 25 26  GLUCOSE 254* 150*  BUN 14  13  CREATININE 1.25* 1.15  CALCIUM 8.0* 8.2*  MG  --  2.6*  PHOS 2.2* 1.6*   Liver Function Tests Recent Labs    04/09/21 1626 04/10/21 0333  ALBUMIN 3.2* 3.2*   No results for input(s): LIPASE, AMYLASE in the last 72 hours. Cardiac Enzymes No results for input(s): CKTOTAL, CKMB, CKMBINDEX, TROPONINI in the last 72 hours.  BNP: BNP (last 3 results) Recent Labs    12/14/20 1255 02/15/21 1244 03/16/21 1422  BNP 768.9* 923.6* 1,237.2*    ProBNP (last 3 results) No results for input(s): PROBNP in the last 8760 hours.   D-Dimer No results for input(s): DDIMER in the last 72 hours. Hemoglobin A1C No results for input(s): HGBA1C in the last 72 hours.  Fasting Lipid Panel No results for input(s): CHOL, HDL, LDLCALC, TRIG, CHOLHDL, LDLDIRECT in the last 72 hours. Thyroid Function Tests No results for input(s): TSH, T4TOTAL, T3FREE, THYROIDAB in the last 72 hours.  Invalid input(s): FREET3   Other results:   Imaging    No results found.   Medications:     Scheduled Medications:  mouth rinse  15 mL Mouth Rinse BID   midodrine  10 mg Oral TID with meals   mometasone-formoterol  2 puff Inhalation BID   pantoprazole (PROTONIX) IV  40 mg Intravenous Daily   sodium chloride flush  10-40 mL Intracatheter Q12H    Infusions:  sodium  chloride     milrinone 0.25 mcg/kg/min (04/12/21 0700)   norepinephrine (LEVOPHED) Adult infusion 2 mcg/min (04/12/21 0700)   vasopressin 0.03 Units/min (04/12/21 0700)    PRN Medications: acetaminophen **OR** acetaminophen, albuterol, antiseptic oral rinse, docusate sodium, fentaNYL (SUBLIMAZE) injection, haloperidol **OR** haloperidol **OR** haloperidol lactate, heparin, HYDROmorphone (DILAUDID) injection, lip balm, LORazepam **OR** LORazepam **OR** LORazepam, ondansetron **OR** ondansetron (ZOFRAN) IV, polyethylene glycol, polyvinyl alcohol, sodium chloride flush, traZODone    Patient Profile   Vincent Chandler is a 60 yo male with  history of chronic systolic HF, CAD s/p CABG AFL s/p ablation and atrial fib, hx VT, ILD d/t amio toxicity and stage III CKD. Admitted yesterday with syncope and a/c biventricular failure/cardiogenic shock.  Assessment/Plan    1. Acute on chronic systolic CHF: Ischemic cardiomyopathy. [employer] ICD single chamber. 2/19 admission for decompensated HF requiring milrinone. Echo 2/7/019 EF 15% Mildly dilated RV. CPX in 2/19 was suggestive of severe limitation due to HF.   Has IVCD, not LBBB-like => saw Dr. Caryl Comes, will not upgrade ICD to CRT. Echo 3/21 with EF 20-25%.  Echo this admission with EF < 20%, moderate RV dysfunction.  Admitted with hypotension, AKI and concern for cardiogenic shock.   -OFF CVVH with transition to comfort care.  Remains on milrinone 0.25, NE 2 mcg, vasopressin 0.03 units.  - Continue midodrine 10 mg tid.  - With significant ILD and now renal failure, not an LVAD or transplant candidate.   -  Code status changed to DNR/DNI  - Long discussion with Palliative Care and transitioning transition to comfort care. Will keep drips going for now until family has time to visit.  2. VT: 01/13/21. Asymptomatic. 1 ATP -> 1 shock that was successful. Device interrogated this admission, no VT.  3. Afib/ Atrial flutter: Admitted in 2/19 with severe HF decompensation in setting of atrial flutter with RVR.  He was cardioverted.  He then had atrial flutter ablation in 3/19. He is off amiodarone due to question of amiodarone lung toxicity. No palpitations.  He is back in atrial fibrillation with mild RVR this admission.  - Rate controlled.  - - Not candidate for amiodarone with concern for prior lung toxicity.    - Anticoagulation on hold for now with drop in hgb and +FOBT.  4. CAD: S/p CABG.  Last intervention was PCI to LCx in 2016. Repeat cath (1/22) with occluded SVG-OM and SVG-RCA with patent LIMA-LAD and SVG-D, occluded proximal RCA, and newly occluded mid LCX. No targets for  revascularization.  No chest pain but HS-TnI elevated to 3988 this admission.  Not felt to be true ACS, suspect demand ischemia with profound shock.  - No chest pain.  - Continue atorvastatin.  - ASA 81 for now.  5. Pulmonary fibrosis: PFTs in 2/19 were restrictive, and high resolution CT showed interstitial lung disease. Amiodarone was stopped due to concern for possible toxicity.  He has seen [name] => bronchoscopy suggestive of ILD related to amiodarone.  He has been on chronic prednisone.  High resolution CT chest repeated in 6/20, again showing ILD, described at UIP pattern. He has started Ofev. I think that a significant amount of his symptomatology is due to pulmonary fibrosis. Baseline on home oxygen.  6. OSA: Uses CPAP 7. AKI on CKD stage 3: Possible ATN due to hypotension (sound like BP may have been low for a couple of weeks prior to admission based on symptoms). Still making some urine - Now off CVVHD as above 8. Pulmonary  hypertension: Suspect group 3 PH due to lung disease (ILD). RHC (1/22) with moderate pulmonary arterial hypertension, PVR 4.2 WU. - He did not tolerate Tyvaso for PH-ILD. 9. DM2: He did not tolerate [place].  10. Thrombocytopenia: Suspect related to critical illness,trending down 59>70 > 75K today.  He is not currently anticoagulated.  11. ID: Afebrile, he is on empiric ceftriaxone but no definite sign of infection.  12. GI bleeding: FOBT+ with hgb falling - Anticoagulation on hold for now.  - PPI.  - Received 1UPRBC 10/6 hemoglobin   13. RUE swelling: [place] negative for DVT.   -No longer getting lab work.  - Continue drips until family has time to visit with him. Anticipate hospital death.   Length of Stay: [place], NP  04/12/2021, 8:50 AM  Advanced Heart Failure Team Pager [phone] (M-F; [place])  Please contact [facility] for night-coverage after hours (5p -7a ) and weekends on [email or web address]  Agree with the above note.  He is off CVVH,  remains on pressors while visiting with family.  No labs, plan to transition eventually to full comfort.  We will make sure that his defibrillator [facility]) therapies are turned off.   Loralie Champagne 04/12/2021 1:17 PM

## 2021-04-12 NOTE — Progress Notes (Signed)
This chaplain attempted a F/U spiritual care visit. The Pt. is sleeping at the time of the visit.  The chaplain is appreciative of the RN's update and will plan a F/U visit in coordination with the PMT.

## 2021-04-12 NOTE — Progress Notes (Signed)
Daily Progress Note   Patient Name: Vincent Chandler       Date: 04/12/2021 DOB: 1961/04/28  Age: 60 y.o. MRN#: [id number] Attending Physician: Vincent Doom, MD Primary Care Physician: Vincent Chandler, Vincent Apa, DO Admit Date: 04/23/2021 Length of Stay: 8 days  Reason for Consultation/Follow-up: Establishing goals of care and Withdrawal of life-sustaining treatment  HPI/Patient Profile:  60 y.o. male  with past medical history of HFrEF s/p AICD, CAD s/p CABG x 4, intermittent AFib, HLD, OSA on CPAP, ILD on chronic steroids, CKD 3, T2DM admitted on 04/05/2021 with cc of syncopal episode, weakness, and dark stools. Work-up found acute on chronic CHF causing cardiogenic shock. He has developed cardiorenal syndrome and was started on CRRT for AKI and has required multiple vasopressors. Currently on midodrine, vasopressin, and levophed. Being followed by advanced heart failure team.    EF <20% this admission, cardiogenic shock. HF team notes not a LVAD or transplant candidate with significant ILD and renal failure. Made a DNR yesterday. Plan to attempt CVP down to 10 with CRRT then hold CRRT and if no improvement then hospice is only answer.   PCCM feels his heart is too weak to tolerate iHD and not a good candidate for long term HD regardless. Cariogenic shock on multiple pressors and ionotropes with no hope of improvement. They have explained to the patient he will not survive this illness and recommended making plans with PMT for death over the next few days.  Subjective:   Subjective: Chart Reviewed. Updates received. Patient Assessed. Created space and opportunity for patient  and family to explore thoughts and feelings regarding current medical situation.  Today's Discussion: I met with the patient at the bedside.  No family was present.  We had a good discussion about his visits with family and friends.  He states that he has had many people come to see him, more are flying in later today.  He  is happy to be able to see his family and friends before he passed away.  No discussion was had on specific timing of withdrawing vasoactive drips.  He denies any pain at this time.  He states that he has had anxiety.  I asked him if the trazodone or the Xanax helped which she said did not.  I informed him that there is option for Ativan and did however tell the nurse to give him a dose to see if it helps.  His concern is to not be so somnolent that he cannot interact with family.  I acknowledged his concern explained this to the nurse as well.  Recommended starting with 0.5 mg dose.  No other concerns currently.  I provided emotional general support through therapeutic listening.  Answered all questions and addressed all concerns.  Review of Systems  Constitutional:  Positive for fatigue.  Respiratory:  Negative for shortness of breath.   Cardiovascular:  Negative for chest pain.  Gastrointestinal:  Negative for abdominal pain.  Psychiatric/Behavioral:  The patient is nervous/anxious.    Objective:   Vital Signs:  BP 127/86   Pulse 91   Temp 98.4 F (36.9 C) (Oral)   Resp 17   Ht _0  (1.753 m)   Wt 75.3 kg   SpO2 91%   BMI 24.51 kg/m   Physical Exam: Physical Exam Vitals and nursing note reviewed.  Constitutional:      General: He is not in acute distress.    Appearance: He is normal weight. He is ill-appearing.  HENT:     Head: Normocephalic and atraumatic.  Cardiovascular:     Rate and Rhythm: Tachycardia present.  Pulmonary:     Effort: No respiratory distress.     Breath sounds: No wheezing or rhonchi.  Abdominal:     General: Abdomen is flat.     Palpations: Abdomen is soft.  Skin:    General: Skin is warm and dry.  Neurological:     General: No focal deficit present.     Mental Status: He is alert.    Palliative Assessment/Data: 30%   Assessment & Plan:   Impression: Present on Admission:  Hyperkalemia  Very ill 60 year old male with end-stage heart  failure, cardiorenal, acute kidney injury.  His acute on chronic health concerns relating to his hospitalization are incurable.  He is well aware of this.  Anticipate in-hospital death.  We will plan for transition to comfort care when he has had time to visit with family and friends.  SUMMARY OF RECOMMENDATIONS   Remain DNR No escalation of care Allow time for family and friends to visit After adequate time for plan to transition to comfort care including withdrawing life support and drips and starting comfort drips PMT will continue to follow  Code Status: DNR  Prognosis: Hours - Days  Discharge Planning: Anticipated Hospital Death  Discussed with: Medical team, nursing team, patient  Thank you for allowing [place] to participate in the care of Vincent Chandler PMT will continue to support holistically.  Time Total: 35 min  Visit consisted of counseling and education dealing with the complex and emotionally intense issues of symptom management and palliative care in the setting of serious and potentially life-threatening illness. Greater than 50%  of this time was spent counseling and coordinating care related to the above assessment and plan.  Vincent Field, NP Palliative Medicine Team  Team Phone # [phone] (Nights/Weekends)  03/01/2021, 8:17 AM

## 2021-04-12 NOTE — Progress Notes (Signed)
Chaplain responded to page to unit to speak with family about notary of a durable power of attorney.  Chaplain explained [facility] notary only authorized notarize [place].  Chaplain problem solved with the family to determine they will call pt's attorney who will bring a notary to the hospital tomorrow.  In the meantime Chaplain checked pt's record to determine both his sister and brother are listed as his next of kin.  Pt's sister invited Chaplain to visit with pt to make sure he understands that without having a HCPOA (which he doesn't want) his sister and brother (in that order) will direct his health care at such time he is unable to speak for himself.    Pt. Is very clear he wants to manage his own health care for as long as he is able. At such time as he is beyond speaking for himself he wants his sister and brother to take over as they know his wishes. Chaplain communicated with pt's nurse the importance (per pt's wishes) that sister's name be listed as first point of contact; followed by his brother.  Pt's wishes his friend's name Truddie Crumble) to be removed as point of contact re. His health concerns.  Nurse agreed to contact medical records to make that change.    When queried if there was any way Chaplain could assist him further, pt requested a one-hour return visit from [facility] tomorrow morning.  Chaplain explained a different Chaplain will be on in the morning, or he and I could visit now.  Pt replied he was too tired to talk now and he would be fine with another Chaplain visiting tomorrow.  Chaplain will place referral for follow-up visit from day Chaplain.  [place]

## 2021-04-13 DIAGNOSIS — N178 Other acute kidney failure: Secondary | ICD-10-CM | POA: Diagnosis not present

## 2021-04-13 DIAGNOSIS — N179 Acute kidney failure, unspecified: Secondary | ICD-10-CM | POA: Diagnosis not present

## 2021-04-13 DIAGNOSIS — K922 Gastrointestinal hemorrhage, unspecified: Secondary | ICD-10-CM | POA: Diagnosis not present

## 2021-04-13 DIAGNOSIS — Z7189 Other specified counseling: Secondary | ICD-10-CM | POA: Diagnosis not present

## 2021-04-13 DIAGNOSIS — Z515 Encounter for palliative care: Secondary | ICD-10-CM | POA: Diagnosis not present

## 2021-04-13 DIAGNOSIS — I959 Hypotension, unspecified: Secondary | ICD-10-CM | POA: Diagnosis not present

## 2021-04-13 MED ORDER — FUROSEMIDE 10 MG/ML IJ SOLN
40.0000 mg | Freq: Once | INTRAMUSCULAR | Status: AC
  Administered 2021-04-13: 40 mg via INTRAVENOUS
  Filled 2021-04-13: qty 4

## 2021-04-13 NOTE — Plan of Care (Signed)
[facility]  Spoke to the patient's family this afternoon at length They feel that the best approach would be to start a narcotic infusion and then stop his vasopressors.  Vincent Chandler has agreed to this plan.  His family has let us know that they think it would bother Vincent Chandler quite a bit if we spent too much time discussing the details of this.  Will work with palliative care to formulate a plan.  Roselie Awkward, MD [facility] Pager: [phone] Cell: [phone] After 7:00 pm call Elink  [phone]

## 2021-04-13 NOTE — Progress Notes (Signed)
This chaplain responded to the chaplain referral for F/U spiritual care.  The chaplain was updated by the Pt. RN-Jamie and PMT NP-Eric before the visit.  The Pt. is open to the chaplain sitting beside him, holding the Pt. hand, and offering a listening presence as the Pt. talked about his perception of death.  The chaplain understands the Pt. has a strong Christian faith in the [facility] tradition and the Pt. will lean into his faith in the experience of dying and eternal life. The Pt. shares with the chaplain he is physically comfortable as we speak and the Pt. trusts the medical team to keep him comfortable.  The chaplain understands the Pt. is a significant role model in his family and the family visits are important to bring him closure in these relationships.     The Pt. accepted the chaplain's invitation to prayer and F/U spiritual care as needed.   Chaplain Sallyanne Kuster [phone]

## 2021-04-13 NOTE — Progress Notes (Signed)
Daily Progress Note   Patient Name: Vincent Chandler       Date: 04/13/2021 DOB: Nov 29, 1960  Age: 60 y.o. MRN#: [id number] Attending Physician: Juanito Doom, MD Primary Care Physician: [name], Alferd Apa, DO Admit Date: 04/23/2021 Length of Stay: 9 days  Reason for Consultation/Follow-up: Establishing goals of care  HPI/Patient Profile:  60 y.o. male  with past medical history of HFrEF s/p AICD, CAD s/p CABG x 4, intermittent AFib, HLD, OSA on CPAP, ILD on chronic steroids, CKD 3, T2DM admitted on 04/18/2021 with cc of syncopal episode, weakness, and dark stools. Work-up found acute on chronic CHF causing cardiogenic shock. He has developed cardiorenal syndrome and was started on CRRT for AKI and has required multiple vasopressors. Currently on midodrine, vasopressin, and levophed. Being followed by advanced heart failure team.    EF <20% this admission, cardiogenic shock. HF team notes not a LVAD or transplant candidate with significant ILD and renal failure. Made a DNR yesterday. Plan to attempt CVP down to 10 with CRRT then hold CRRT and if no improvement then hospice is only answer.   PCCM feels his heart is too weak to tolerate iHD and not a good candidate for long term HD regardless. Cariogenic shock on multiple pressors and ionotropes with no hope of improvement. They have explained to the patient he will not survive this illness and recommended making plans with PMT for death over the next few days.  Subjective:   Subjective: Chart Reviewed. Updates received. Patient Assessed. Created space and opportunity for patient  and family to explore thoughts and feelings regarding current medical situation.  Today's Discussion: I met with the patient today at the bedside.  There is no family or friends present.  We discussed his general symptoms.  He denies any significant pain, dyspnea, abdominal pain, nausea, vomiting, chest pain.  We had a good discussion about the joy that family and  friends visits have been bringing him.  He states there are other family coming in from [place] today.  He is happy to have been able to visit with them.  I was accompanied into the room by spiritual care/chaplain.  I discussed for him to let us know if he is having any symptoms and we could medicate as necessary.  Anticipate timing for withdrawal of care and transition to comfort care either later this evening or tomorrow.  I stepped out to allow him to have a visit with spiritual care.  Review of Systems  Constitutional:  Positive for fatigue.       Denies pain in general  Respiratory:  Negative for chest tightness and shortness of breath.   Cardiovascular:  Negative for chest pain.  Gastrointestinal:  Negative for abdominal pain, nausea and vomiting.   Objective:   Vital Signs:  BP 103/67 (BP Location: Left Arm)   Pulse (!) 125   Temp 97.8 F (36.6 C) (Oral)   Resp (!) 31   Ht 5' 9" (1.753 m)   Wt 75.3 kg   SpO2 90%   BMI 24.51 kg/m   Physical Exam: Physical Exam Vitals and nursing note reviewed.  Constitutional:      General: He is not in acute distress.    Appearance: He is ill-appearing.  HENT:     Head: Normocephalic and atraumatic.  Pulmonary:     Effort: Pulmonary effort is normal. No respiratory distress.     Breath sounds: No wheezing.  Abdominal:     General: Abdomen is flat.  Palpations: Abdomen is soft.  Skin:    General: Skin is warm and dry.  Neurological:     General: No focal deficit present.     Mental Status: He is alert.    Palliative Assessment/Data: 20-30%   Assessment & Plan:   Impression: Present on Admission:  Hyperkalemia  Very ill 60 year old male with end-stage heart failure, cardiorenal, acute kidney injury.  His acute on chronic health concerns relating to his hospitalization are incurable.  He is well aware of this.  Anticipate in-hospital death.  We will plan for transition to comfort care when he has had time to visit with family  and friends.  SUMMARY OF RECOMMENDATIONS   Remain DNR No escalation of care Allow time for family and friends to visit After adequate time for plan to transition to comfort care including withdrawing life support and drips and starting comfort drips PMT will continue to follow  Code Status: DNR  Prognosis: Hours - Days  Discharge Planning: Anticipated Hospital Death  Discussed with: medical team, nursing team, spiritual care colleague, patient  Thank you for allowing [place] to participate in the care of Vincent Chandler PMT will continue to support holistically.  Time Total: 35 min  Visit consisted of counseling and education dealing with the complex and emotionally intense issues of symptom management and palliative care in the setting of serious and potentially life-threatening illness. Greater than 50%  of this time was spent counseling and coordinating care related to the above assessment and plan.  Walden Field, NP Palliative Medicine Team  Team Phone # [phone] (Nights/Weekends)  03/01/2021, 8:17 AM

## 2021-04-13 NOTE — Progress Notes (Signed)
Patient refusing CPAP at this time; has not worn.  Removing machine from room.  Will set up if the patient changes his mind.

## 2021-04-13 NOTE — Progress Notes (Signed)
NAME:  Vincent Chandler, MRN:  [id number], DOB:  13-Sep-1960, LOS: 42 ADMISSION DATE:  04/02/2021, CONSULTATION DATE:  10/3 REFERRING MD:  EDP, CHIEF COMPLAINT:  Syncope   History of Present Illness:  60 y/o male with multiple cardiac problems presented with acute on chronic systolic heart failure causing cardiogenic shock. Since admission he has started on CRRT for AKI and has required multiple vasopressors.  Pertinent  Medical History  HFrEF (EF of 25-30%) s/p AICD  CAD s/p CABG x4 Intermittent Afib  HLD OSA on CPAP ILD on chronic steroid > UIP pattern T2DM CKD Stage 3  Significant Hospital Events: Including procedures, antibiotic start and stop dates in addition to other pertinent events   10/03 - consulted for hyperkalemia and hypotension and admitted to [facility]. 10/3 CT HEAD > NAICP, generalized atrophy, paranasal sinus disease. Rocephin started empirically 10/3 CT c-spine > no bony abnormality, multi-level degenerative changes 10/04 -advanced heart failure team consulted, central line placed left IJ and left more, right femoral A-line placed 10/04 - tried to initiate CRRT without success due to HD line problems.  10/05 - HD cath inserted in Rt IJ and resumed CRRT, 10/5 Echo > LVEF < 20% global hypokinesis, RV systolic function moderately reduced1 10/06 - started midodrine and weaned off levo. 0/6 Upper ext vasc ultrasound bilaterally > no DVT 10/07 - increased midodrine and remains on levo, milrinone, and vaso. Rocephin stopped. All cultures neg.  10/8 - met with HF team, elected to change code status to DNR 10/9 met w/ palliative. CRRT stopped. Plan to wait for arrival of family and then dc inotropic and vasopressor support.  10/10 not much relief w/ ativan still very anxious  Had 975 cc urine output, appears calm   Interim History / Subjective:  Patient resting in bed and eating breakfast He appears calm. States that he had some urine output overnight.   Objective   Blood  pressure 132/88, pulse (!) 125, temperature 100 F (37.8 C), temperature source Oral, resp. rate (!) 23, height _0  (1.753 m), weight 75.3 kg, SpO2 91 %.        Intake/Output Summary (Last 24 hours) at 04/13/2021 4742 Last data filed at 04/13/2021 0600 Gross per 24 hour  Intake 1007.78 ml  Output 975 ml  Net 32.78 ml   Filed Weights   04/10/21 0136 04/11/21 0427 04/12/21 0500  Weight: 75 kg 75.3 kg 75.3 kg    Examination: General: This is a 60 year old male patient he is chronically ill, currently in no acute distress resting comfortably.  HEENT normocephalic atraumatic no jugular venous distention appreciated Pulmonary: Diminished bases currently no accessory use.  Current oxygen requirements at 4 L Card RRR w/ systolic HM Abd soft not tender Ext Increased pitting edema in bilateral lower extremities, warm and dry and palpapble pulses.  GU 975 cc UOP overnight.  Neuro intact but anxious  Resolved Hospital Problem list   GI bleed/melena> resolved Hyperkalemia  Assessment & Plan:  Cardiogenic shock  Acute on chronic systolic heart failure, ischemic cardiomyopathy Continue current plan with inotropes and vasoactive medications gtt. but otherwise full comfort measures. Waiting on family to come in town. I suspect in hospital death.  -Comfort measures  NSTEMI CAD s/p CABG x4 -Comfort care -Consider discontinuing telemetry and q shift vitals  AKI (acute on chronic (CKD stage III)> Cardiorenal syndrome CKD 3a -CRRT discontinued  Macrocytic anemia Thrombocytopenia -Remains on SCDs -Monitor for bleeding   ILD> UIP pattern on antifibrotics (pirfenidone, prednisone) - Suppmental  O2 as needed.   DM2 Plan Carb mod diet as tolerated   Comfort care PRN analgesia/ narcotics for pain and dyspnea Supplemental oxygen PRN benzos for anxiety Open up visiting hours Plan to stop all vasoactive and inotropic gtts when pt is ready   Best Practice (right click and "Reselect  all SmartList Selections" daily)   Diet/type: Regular consistency (see orders) DVT prophylaxis: SCD GI prophylaxis: N/A Lines: Dialysis Catheter and yes and it is still needed Foley:  N/A Code Status:  DNR Last date of multidisciplinary goals of care discussion [10/9, palliative medicine to see him today]   Critical care time: NA    Lawerance Cruel, D.O.  Internal Medicine Resident, PGY-3 [facility]  Pager: [phone] 6:14 AM, 04/13/2021

## 2021-04-14 DIAGNOSIS — N179 Acute kidney failure, unspecified: Secondary | ICD-10-CM | POA: Diagnosis not present

## 2021-04-14 DIAGNOSIS — Z66 Do not resuscitate: Secondary | ICD-10-CM | POA: Diagnosis not present

## 2021-04-14 DIAGNOSIS — K922 Gastrointestinal hemorrhage, unspecified: Secondary | ICD-10-CM | POA: Diagnosis not present

## 2021-04-14 DIAGNOSIS — Z515 Encounter for palliative care: Secondary | ICD-10-CM | POA: Diagnosis not present

## 2021-04-14 DIAGNOSIS — I509 Heart failure, unspecified: Secondary | ICD-10-CM | POA: Diagnosis not present

## 2021-04-14 DIAGNOSIS — N178 Other acute kidney failure: Secondary | ICD-10-CM | POA: Diagnosis not present

## 2021-04-14 DIAGNOSIS — Z7189 Other specified counseling: Secondary | ICD-10-CM | POA: Diagnosis not present

## 2021-04-14 DIAGNOSIS — I959 Hypotension, unspecified: Secondary | ICD-10-CM | POA: Diagnosis not present

## 2021-04-14 MED ORDER — FENTANYL 2500MCG IN NS 250ML (10MCG/ML) PREMIX INFUSION
0.0000 ug/h | INTRAVENOUS | Status: DC
  Administered 2021-04-14: 100 ug/h via INTRAVENOUS
  Filled 2021-04-14: qty 250

## 2021-04-14 MED ORDER — FENTANYL BOLUS VIA INFUSION
50.0000 ug | INTRAVENOUS | Status: DC | PRN
Start: 2021-04-14 — End: 2021-04-15
  Filled 2021-04-14: qty 50

## 2021-04-14 MED ORDER — LORAZEPAM 2 MG/ML PO CONC: 1.0000 mg | ORAL | Status: DC | PRN

## 2021-04-14 MED ORDER — GLYCOPYRROLATE 0.2 MG/ML IJ SOLN
0.2000 mg | INTRAMUSCULAR | Status: DC | PRN
  Filled 2021-04-14: qty 1

## 2021-04-14 MED ORDER — LORAZEPAM 1 MG PO TABS: 1.0000 mg | ORAL_TABLET | ORAL | Status: DC | PRN

## 2021-04-14 MED ORDER — FENTANYL CITRATE (PF) 100 MCG/2ML IJ SOLN
INTRAMUSCULAR | Status: AC
  Administered 2021-04-14: 50 ug
  Filled 2021-04-14: qty 2

## 2021-04-14 MED ORDER — LORAZEPAM 2 MG/ML IJ SOLN
1.0000 mg | INTRAMUSCULAR | Status: DC | PRN
  Filled 2021-04-14: qty 1

## 2021-04-14 NOTE — Progress Notes (Signed)
Arterial line removed due to it clotting. E-link RN made aware.

## 2021-04-14 NOTE — Progress Notes (Signed)
NAME:  Vincent Chandler, MRN:  [id number], DOB:  1961/04/05, LOS: 52 ADMISSION DATE:  05/01/2021, CONSULTATION DATE:  10/3 REFERRING MD:  EDP, CHIEF COMPLAINT:  Syncope   History of Present Illness:  60 y/o male with multiple cardiac problems presented with acute on chronic systolic heart failure causing cardiogenic shock. Since admission he has started on CRRT for AKI and has required multiple vasopressors.  Pertinent  Medical History  HFrEF (EF of 25-30%) s/p AICD  CAD s/p CABG x4 Intermittent Afib  HLD OSA on CPAP ILD on chronic steroid > UIP pattern T2DM CKD Stage 3  Significant Hospital Events: Including procedures, antibiotic start and stop dates in addition to other pertinent events   10/03 - consulted for hyperkalemia and hypotension and admitted to [facility]. 10/3 CT HEAD > NAICP, generalized atrophy, paranasal sinus disease. Rocephin started empirically 10/3 CT c-spine > no bony abnormality, multi-level degenerative changes 10/04 -advanced heart failure team consulted, central line placed left IJ and left more, right femoral A-line placed 10/04 - tried to initiate CRRT without success due to HD line problems.  10/05 - HD cath inserted in Rt IJ and resumed CRRT, 10/5 Echo > LVEF < 20% global hypokinesis, RV systolic function moderately reduced1 10/06 - started midodrine and weaned off levo. 0/6 Upper ext vasc ultrasound bilaterally > no DVT 10/07 - increased midodrine and remains on levo, milrinone, and vaso. Rocephin stopped. All cultures neg.  10/8 - met with HF team, elected to change code status to DNR 10/9 met w/ palliative. CRRT stopped. Plan to wait for arrival of family and then dc inotropic and vasopressor support.  10/10 not much relief w/ ativan still very anxious  Had 975 cc urine output, appears calm   Interim History / Subjective:  Patient resting in bed and eating breakfast He appears calm. States that he had some urine output overnight.   Objective   Blood  pressure 133/88, pulse (!) 109, temperature 97.8 F (36.6 C), temperature source Oral, resp. rate (!) 27, height 5' 9" (1.753 m), weight 75.8 kg, SpO2 93 %. CVP:  [22 mmHg] 22 mmHg      Intake/Output Summary (Last 24 hours) at 04/14/2021 0631 Last data filed at 04/14/2021 0600 Gross per 24 hour  Intake 907.02 ml  Output 1050 ml  Net -142.98 ml   Filed Weights   04/11/21 0427 04/12/21 0500 04/14/21 0500  Weight: 75.3 kg 75.3 kg 75.8 kg    Examination: General: chronically ill appearing, sleepy and appears encephalopathic  HEENT normocephalic atraumatic no jugular venous distention appreciated Pulmonary: tachypnic, diffuse rhonchi and increased cough Abd soft not tender Ext- 2-3+ pitting edema in bilateral lower extremities, warm and dry and palpapble pulses.  GU 1050 cc UOP overnight.  Neuro intact without significant anxiety  Resolved Hospital Problem list   GI bleed/melena> resolved Hyperkalemia  Assessment & Plan:  Cardiogenic shock  Acute on chronic systolic heart failure, ischemic cardiomyopathy - D/c pressors and drips today  - Continue Lasix 80 mg today for comfort diuresis/swelling   NSTEMI CAD s/p CABG x4 -Comfort care -Consider discontinuing telemetry and q shift vitals  AKI (acute on chronic (CKD stage III)> Cardiorenal syndrome CKD 3a -CRRT discontinued  Macrocytic anemia Thrombocytopenia -Remains on SCDs -Monitor for bleeding   ILD> UIP pattern on antifibrotics (pirfenidone, prednisone) - Suppmental O2 as needed.   DM2 Plan Carb mod diet as tolerated   Comfort care PRN analgesia/ narcotics for pain and dyspnea Supplemental oxygen PRN benzos for anxiety  Open up visiting hours Plan to stop all vasoactive and inotropic gtts when pt is ready   Best Practice (right click and "Reselect all SmartList Selections" daily)   Diet/type: Regular consistency (see orders) DVT prophylaxis: SCD GI prophylaxis: N/A Lines: Dialysis Catheter and yes and it  is still needed Foley:  N/A Code Status:  DNR Last date of multidisciplinary goals of care discussion [10/9, palliative medicine to see him today]   Critical care time: NA    Lawerance Cruel, D.O.  Internal Medicine Resident, PGY-3 [facility]  Pager: [phone] 6:31 AM, 04/14/2021

## 2021-04-14 NOTE — Progress Notes (Signed)
Daily Progress Note   Patient Name: Vincent Chandler       Date: 04/14/2021 DOB: 1961/01/18  Age: 60 y.o. MRN#: [id number] Attending Physician: Juanito Doom, MD Primary Care Physician: [name], Alferd Apa, DO Admit Date: 04/21/2021 Length of Stay: 10 days  Reason for Consultation/Follow-up: Establishing goals of care  HPI/Patient Profile:  60 y.o. male  with past medical history of HFrEF s/p AICD, CAD s/p CABG x 4, intermittent AFib, HLD, OSA on CPAP, ILD on chronic steroids, CKD 3, T2DM admitted on 05/01/2021 with cc of syncopal episode, weakness, and dark stools. Work-up found acute on chronic CHF causing cardiogenic shock. He has developed cardiorenal syndrome and was started on CRRT for AKI and has required multiple vasopressors. Currently on midodrine, vasopressin, and levophed. Being followed by advanced heart failure team.    EF <20% this admission, cardiogenic shock. HF team notes not a LVAD or transplant candidate with significant ILD and renal failure. Made a DNR yesterday. Plan to attempt CVP down to 10 with CRRT then hold CRRT and if no improvement then hospice is only answer.   PCCM feels his heart is too weak to tolerate iHD and not a good candidate for long term HD regardless. Cariogenic shock on multiple pressors and ionotropes with no hope of improvement. They have explained to the patient he will not survive this illness and recommended making plans with PMT for death over the next few days.  Subjective:   Subjective: Chart Reviewed. Updates received. Patient Assessed. Decisions were made yesterday regarding plan of care and family has shared that patient does not want to review plan any further. Patient becoming more encephalopathic.  Today's Discussion: Met with patient's brother and sister. We review decisions made yesterday with medical team to proceed with comfort measures only. We review plan to initiate comfort medications and once we feel patient is comfortable we  will proceed with cessation of continuous infusions. Educated on medications used to keep him comfortable. Discussed expectations following cessation of infusions. Family expressed understanding and agreed to move forward with comfort focused care.   Objective:   Vital Signs:  BP (!) 118/99   Pulse 91   Temp 97.8 F (36.6 C) (Oral)   Resp (!) 25   Ht 5' 9" (1.753 m)   Wt 75.8 kg   SpO2 96%   BMI 24.68 kg/m   Physical Exam: Physical Exam Vitals and nursing note reviewed.  Constitutional:      General: He is not in acute distress.    Appearance: He is ill-appearing.  HENT:     Head: Normocephalic and atraumatic.  Pulmonary:     Comments: Slightly labored, congested Abdominal:     General: Abdomen is flat.     Palpations: Abdomen is soft.  Skin:    General: Skin is warm and dry.  Neurological:     General: No focal deficit present.     Mental Status: He is alert.    Palliative Assessment/Data: 20-30%   Assessment & Plan:   Impression: Present on Admission:  Hyperkalemia  Very ill 60 year old male with end-stage heart failure, cardiorenal, acute kidney injury.  His acute on chronic health concerns relating to his hospitalization are incurable.  He is well aware of this.  Anticipate in-hospital death.    SUMMARY OF RECOMMENDATIONS   Remain DNR Comfort measures only - initiate PRN ativan and fentanyl infusion, dc milrinone and pressors once patient is comfortable Allow time for family and friends to visit PMT  will continue to follow  Code Status: DNR  Prognosis: Hours - Days  Discharge Planning: Anticipated Hospital Death  Discussed with: medical team, nursing team, patient's brother and sister  Thank you for allowing [place] to participate in the care of [facility] PMT will continue to support holistically.  Time Total: 35 minutes  Greater than 50%  of this time was spent counseling and coordinating care related to the above assessment and plan.   Juel Burrow, DNP, AGNP-C Palliative Medicine Team Team Phone # [phone]  Pager # [phone]

## 2021-04-14 NOTE — Progress Notes (Signed)
Palliative:  Chart reviewed. Awaiting family visitation prior to proceeding with comfort measures only - some family/friends have visited today; however, patient's sister has been main contact. She has not been at  the hospital today. Roselyn Reef, RN and I have made multiple attempts to contact her by phone with no answer. Will continue to await call back or visit from sister prior to proceeding. Briefly visited patient - he c/o slight congestion but otherwise feels okay. He is not sure when his sister is coming.   Juel Burrow, DNP, AGNP-C Palliative Medicine Team Team Phone # [phone]  Pager # [phone]   NO CHARGE

## 2021-04-15 DIAGNOSIS — R570 Cardiogenic shock: Secondary | ICD-10-CM

## 2021-05-03 NOTE — Death Summary Note (Addendum)
Physician Death Summary  Name: OLEN EAVES MRN: [id number] DOB: July 17, 1960 60 y.o.  Date of Admission: 04/20/2021  6:58 PM Date of Discharge: 05-03-21 Attending Physician: Maryjane Hurter, MD  Discharge Diagnosis: Principal Problem:   Cardiogenic shock [facility]) Active Problems:   Essential hypertension   Acute renal failure (HCC)   NSTEMI (non-ST elevated myocardial infarction) ([place])   OSA (obstructive sleep apnea)   Atrial flutter (HCC)   ILD (interstitial lung disease) ([place])   Uncontrolled type 2 diabetes mellitus with hyperglycemia (HCC)   Hyperkalemia   Malnutrition of moderate degree  Cause of death: CHF Time of death: 0530  Disposition and follow-up:   Mr.Issa W Dorian was discharged from [facility] in expired condition.    Hospital Course: Waldo Damian is a 60 y/o male with a pertinent PMH of HFrEF, CAD s/p CABG, Afib, OSA, ILD, T2DM, CKD stage 3, who presented with hypotension and hyperkalemia after a syncopal episode and admitted for acute decompensated heart failures complicated by oliguric acute renal failure 2/2 to cardiorenal syndrome. His clinical course was complicated by significant underlying interstitial lung disease and prior ischemic heart disease. The advanced heart failure team was consulted and vasopressors and milrinone were initiated. Patient became progressively anuric with electrolyte abnormalities and was subsequently started on CRRT. Patient had minimally cardiovascular improvement during this time and as a result unable to transition to HD. [place] conversation were had with the patient and his family. Palliative care was consulted, and he was transitioned to DNR and subsequently comfort care.   Lawerance Cruel, D.O.  Internal Medicine Resident, PGY-3 [facility]  Pager: [phone] 7:27 AM, May 03, 2021

## 2021-05-03 NOTE — Progress Notes (Signed)
This nurse noticed patient's HR drastically decreased from 100s to 50s. This nurse immediately called family and was will patient at bedside. Family did not answer at this time. Patient became asystole at 0530 on monitor. This nurse and Henrietta Dine RN called time of death at [facility] after listening for a heartbeat. [place] and elink was called. Family returned call at [phone] and plans to visit patient.

## 2021-05-03 DEATH — deceased

## 2021-05-06 ENCOUNTER — Encounter (HOSPITAL_COMMUNITY): Payer: Medicare HMO | Admitting: Cardiology

## 2021-05-20 ENCOUNTER — Other Ambulatory Visit (HOSPITAL_COMMUNITY): Payer: Self-pay | Admitting: Family Medicine

## 2021-05-23 ENCOUNTER — Other Ambulatory Visit (HOSPITAL_COMMUNITY): Payer: Self-pay
